# Patient Record
Sex: Male | Born: 1960 | Race: White | Hispanic: Yes | Marital: Married | Smoking: Former smoker
Health system: Southern US, Community
[De-identification: ages and names within clinical notes are randomized; demographics above are authoritative.]

## PROBLEM LIST (undated history)

## (undated) DIAGNOSIS — L03317 Cellulitis of buttock: Secondary | ICD-10-CM

## (undated) DIAGNOSIS — L0231 Cutaneous abscess of buttock: Secondary | ICD-10-CM

## (undated) DIAGNOSIS — IMO0002 Reserved for concepts with insufficient information to code with codable children: Secondary | ICD-10-CM

## (undated) DIAGNOSIS — I10 Essential (primary) hypertension: Secondary | ICD-10-CM

## (undated) DIAGNOSIS — G8220 Paraplegia, unspecified: Secondary | ICD-10-CM

## (undated) HISTORY — DX: Reserved for concepts with insufficient information to code with codable children: IMO0002

## (undated) HISTORY — PX: SPINE SURGERY: SHX786

---

## 2004-06-13 ENCOUNTER — Ambulatory Visit: Payer: Self-pay | Admitting: Family Medicine

## 2004-07-09 ENCOUNTER — Ambulatory Visit: Payer: Self-pay | Admitting: Internal Medicine

## 2004-07-11 ENCOUNTER — Ambulatory Visit: Payer: Self-pay | Admitting: *Deleted

## 2004-08-06 ENCOUNTER — Ambulatory Visit: Payer: Self-pay | Admitting: Internal Medicine

## 2004-09-12 ENCOUNTER — Ambulatory Visit: Payer: Self-pay | Admitting: Family Medicine

## 2004-12-11 ENCOUNTER — Ambulatory Visit: Payer: Self-pay | Admitting: Internal Medicine

## 2005-02-23 ENCOUNTER — Ambulatory Visit (HOSPITAL_COMMUNITY): Admission: RE | Admit: 2005-02-23 | Discharge: 2005-02-23 | Payer: Self-pay | Admitting: Family Medicine

## 2005-02-23 ENCOUNTER — Ambulatory Visit: Payer: Self-pay | Admitting: Family Medicine

## 2005-10-13 ENCOUNTER — Encounter (INDEPENDENT_AMBULATORY_CARE_PROVIDER_SITE_OTHER): Payer: Self-pay | Admitting: Family Medicine

## 2005-10-13 LAB — CONVERTED CEMR LAB: PSA: 0.92 ng/mL

## 2005-10-20 ENCOUNTER — Ambulatory Visit: Payer: Self-pay | Admitting: Family Medicine

## 2005-12-02 ENCOUNTER — Ambulatory Visit: Payer: Self-pay | Admitting: Family Medicine

## 2006-02-10 ENCOUNTER — Ambulatory Visit: Payer: Self-pay | Admitting: Family Medicine

## 2006-03-24 ENCOUNTER — Ambulatory Visit: Payer: Self-pay | Admitting: Nurse Practitioner

## 2006-05-12 ENCOUNTER — Ambulatory Visit: Payer: Self-pay | Admitting: Family Medicine

## 2006-07-21 ENCOUNTER — Ambulatory Visit: Payer: Self-pay | Admitting: Family Medicine

## 2006-09-07 ENCOUNTER — Emergency Department (HOSPITAL_COMMUNITY): Admission: EM | Admit: 2006-09-07 | Discharge: 2006-09-07 | Payer: Self-pay | Admitting: Family Medicine

## 2006-09-15 ENCOUNTER — Ambulatory Visit: Payer: Self-pay | Admitting: Family Medicine

## 2006-10-06 ENCOUNTER — Ambulatory Visit: Payer: Self-pay | Admitting: Family Medicine

## 2006-10-07 ENCOUNTER — Encounter (INDEPENDENT_AMBULATORY_CARE_PROVIDER_SITE_OTHER): Payer: Self-pay | Admitting: Family Medicine

## 2006-10-07 LAB — CONVERTED CEMR LAB: PSA: 0.92 ng/mL

## 2007-01-27 ENCOUNTER — Ambulatory Visit: Payer: Self-pay | Admitting: Family Medicine

## 2007-03-10 ENCOUNTER — Ambulatory Visit: Payer: Self-pay | Admitting: Internal Medicine

## 2007-03-23 ENCOUNTER — Ambulatory Visit: Payer: Self-pay | Admitting: Family Medicine

## 2007-04-12 ENCOUNTER — Ambulatory Visit: Payer: Self-pay | Admitting: Family Medicine

## 2007-05-25 ENCOUNTER — Ambulatory Visit: Payer: Self-pay | Admitting: Family Medicine

## 2007-05-25 DIAGNOSIS — K3189 Other diseases of stomach and duodenum: Secondary | ICD-10-CM | POA: Insufficient documentation

## 2007-05-25 DIAGNOSIS — E785 Hyperlipidemia, unspecified: Secondary | ICD-10-CM | POA: Insufficient documentation

## 2007-05-25 DIAGNOSIS — R1013 Epigastric pain: Secondary | ICD-10-CM

## 2007-05-25 DIAGNOSIS — J309 Allergic rhinitis, unspecified: Secondary | ICD-10-CM | POA: Insufficient documentation

## 2007-05-26 DIAGNOSIS — IMO0001 Reserved for inherently not codable concepts without codable children: Secondary | ICD-10-CM | POA: Insufficient documentation

## 2007-05-26 DIAGNOSIS — E119 Type 2 diabetes mellitus without complications: Secondary | ICD-10-CM | POA: Insufficient documentation

## 2007-05-26 DIAGNOSIS — E1165 Type 2 diabetes mellitus with hyperglycemia: Secondary | ICD-10-CM

## 2007-05-26 DIAGNOSIS — E669 Obesity, unspecified: Secondary | ICD-10-CM | POA: Insufficient documentation

## 2007-06-29 ENCOUNTER — Encounter (INDEPENDENT_AMBULATORY_CARE_PROVIDER_SITE_OTHER): Payer: Self-pay | Admitting: *Deleted

## 2007-09-23 ENCOUNTER — Ambulatory Visit: Payer: Self-pay | Admitting: Family Medicine

## 2007-09-23 LAB — CONVERTED CEMR LAB
AST: 24 units/L (ref 0–37)
Albumin: 4.6 g/dL (ref 3.5–5.2)
BUN: 12 mg/dL (ref 6–23)
CO2: 25 meq/L (ref 19–32)
Calcium: 9.6 mg/dL (ref 8.4–10.5)
Cholesterol: 131 mg/dL (ref 0–200)
Glucose, Bld: 150 mg/dL — ABNORMAL HIGH (ref 70–99)
HDL: 50 mg/dL (ref >39)
Potassium: 4.2 meq/L (ref 3.5–5.3)
Total CHOL/HDL Ratio: 2.6

## 2008-08-09 ENCOUNTER — Emergency Department (HOSPITAL_COMMUNITY): Admission: EM | Admit: 2008-08-09 | Discharge: 2008-08-09 | Payer: Self-pay | Admitting: Family Medicine

## 2009-03-25 ENCOUNTER — Ambulatory Visit (HOSPITAL_BASED_OUTPATIENT_CLINIC_OR_DEPARTMENT_OTHER): Admission: RE | Admit: 2009-03-25 | Discharge: 2009-03-25 | Payer: Self-pay | Admitting: Orthopedic Surgery

## 2009-03-25 ENCOUNTER — Encounter (INDEPENDENT_AMBULATORY_CARE_PROVIDER_SITE_OTHER): Payer: Self-pay | Admitting: Orthopedic Surgery

## 2010-04-18 ENCOUNTER — Emergency Department (HOSPITAL_COMMUNITY): Admission: EM | Admit: 2010-04-18 | Discharge: 2010-04-18 | Payer: Self-pay | Admitting: Family Medicine

## 2010-04-18 ENCOUNTER — Emergency Department (HOSPITAL_COMMUNITY): Admission: EM | Admit: 2010-04-18 | Discharge: 2010-04-18 | Payer: Self-pay | Admitting: Emergency Medicine

## 2010-10-02 ENCOUNTER — Emergency Department (HOSPITAL_COMMUNITY)
Admission: EM | Admit: 2010-10-02 | Discharge: 2010-10-02 | Payer: Self-pay | Source: Home / Self Care | Admitting: Family Medicine

## 2011-01-19 LAB — GLUCOSE, CAPILLARY
Glucose-Capillary: 169 mg/dL — ABNORMAL HIGH (ref 70–99)
Glucose-Capillary: 214 mg/dL — ABNORMAL HIGH (ref 70–99)
Glucose-Capillary: 239 mg/dL — ABNORMAL HIGH (ref 70–99)

## 2011-01-19 LAB — POCT I-STAT, CHEM 8
BUN: 11 mg/dL (ref 6–23)
Calcium, Ion: 1.22 mmol/L (ref 1.12–1.32)
Chloride: 101 mEq/L (ref 96–112)
Glucose, Bld: 175 mg/dL — ABNORMAL HIGH (ref 70–99)
HCT: 43 % (ref 39.0–52.0)
Sodium: 135 mEq/L (ref 135–145)
TCO2: 23 mmol/L (ref 0–100)

## 2011-02-24 NOTE — Op Note (Signed)
John Meza, John Meza                ACCOUNT NO.:  [id number]   MEDICAL RECORD NO.:  [id number]          PATIENT TYPE:  AMB   LOCATION:  DSC                          FACILITY:  [facility]   PHYSICIAN:  Feliberto Gottron. Turner Daniels, M.D.   DATE OF BIRTH:  01/22/1961   DATE OF PROCEDURE:  03/25/2009  DATE OF DISCHARGE:                               OPERATIVE REPORT   PREOPERATIVE DIAGNOSIS:  Ganglion cyst, medial aspect of right foot.   POSTOPERATIVE DIAGNOSIS:  Ganglion cyst, medial aspect of right foot.   PROCEDURE:  Excisional biopsy of right foot ganglion cyst.   SURGEON:  Feliberto Gottron. Turner Daniels, MD   FIRST ASSISTANT:  Shirl Harris, PA   ANESTHETIC:  General LMA.   ESTIMATED BLOOD LOSS:  Minimal.   FLUID REPLACEMENT:  500 mL of crystalloid.   TOURNIQUET TIME:  20 minutes.   INDICATIONS FOR PROCEDURE:  A 50 year old Hispanic gentleman with a  ganglion cyst on medial aspect of his right foot arch.  It has been  present for a couple of years.  We have aspirated twice, got out almost  30 mL of jelly-like fluid one time.  It is near the first TMT joint,  although it is actually at the junction of the middle and proximal  thirds of the first metatarsal.  In any event, it is 2-3 cm in size.  He  desires elective removal.  The risks and benefits of surgery have been  discussed and he has failed conservative treatment with the aspiration  and cortisone injection.   DESCRIPTION OF PROCEDURE:  The patient identified by armband and  received preoperative IV antibiotics at the holding area of [facility], then taken to operating room #6.  Appropriate anesthetic  monitors were attached and general LMA anesthesia induced with the  patient in supine position.  Tourniquet applied to the right calf and  the right lower extremity prepped and draped in usual sterile fashion  from the toes to the tourniquet.  Limb wrapped with an Esmarch bandage,  tourniquet inflated to 300 mmHg.  We began the  procedure by making a  medial incision over the ganglion cyst, which was directly below the  cutaneous tissue.  At about two thirds of the way down the incision, we  actually entered the ganglion cyst even though the incision was only 1  or 2 mm in depth.  We expelled the jelly from the cyst and then using  tenotomy scissors shelled out the cyst, which was tracked all the way  down to the tendon of the one of the abductors of the great toe.  We  then skived the neck of the cyst off the tendon and removed the cyst in  toto.  The wound was then irrigated out with normal saline solution.  Because of the balloon effect on the skin, we went ahead and excised 3-4  mm of skin elliptically from each side of the wound to  obtain a closure under minimal tension.  We then closed in layers using  3-0 Vicryl suture subcutaneously and 4-0 Monocryl suture in the  skin.  A  dressing of Xeroform, 4x4 dressing, sponges, Webril, and an Ace wrap was  then applied followed by a postop shoe.  The patient was awakened and  taken to the recovery room without difficulty.      Feliberto Gottron. Turner Daniels, M.D.  Electronically Signed     FJR/MEDQ  D:  03/25/2009  T:  03/26/2009  Job:  [id number]

## 2011-10-13 DIAGNOSIS — G8220 Paraplegia, unspecified: Secondary | ICD-10-CM

## 2011-10-13 HISTORY — DX: Paraplegia, unspecified: G82.20

## 2011-11-16 ENCOUNTER — Emergency Department (HOSPITAL_COMMUNITY): Payer: Worker's Compensation

## 2011-11-16 ENCOUNTER — Encounter (HOSPITAL_COMMUNITY): Payer: Self-pay | Admitting: Physical Medicine and Rehabilitation

## 2011-11-16 ENCOUNTER — Inpatient Hospital Stay (HOSPITAL_COMMUNITY)
Admission: EM | Admit: 2011-11-16 | Discharge: 2011-11-20 | DRG: 029 | Disposition: A | Discharge status: Rehabilitation Facility | Payer: Worker's Compensation | Attending: General Surgery | Admitting: General Surgery

## 2011-11-16 DIAGNOSIS — S0101XA Laceration without foreign body of scalp, initial encounter: Secondary | ICD-10-CM | POA: Diagnosis present

## 2011-11-16 DIAGNOSIS — R1013 Epigastric pain: Secondary | ICD-10-CM | POA: Diagnosis present

## 2011-11-16 DIAGNOSIS — S060X1A Concussion with loss of consciousness of 30 minutes or less, initial encounter: Secondary | ICD-10-CM | POA: Diagnosis present

## 2011-11-16 DIAGNOSIS — Z79899 Other long term (current) drug therapy: Secondary | ICD-10-CM

## 2011-11-16 DIAGNOSIS — E669 Obesity, unspecified: Secondary | ICD-10-CM | POA: Diagnosis present

## 2011-11-16 DIAGNOSIS — G8220 Paraplegia, unspecified: Secondary | ICD-10-CM | POA: Diagnosis present

## 2011-11-16 DIAGNOSIS — IMO0002 Reserved for concepts with insufficient information to code with codable children: Secondary | ICD-10-CM

## 2011-11-16 DIAGNOSIS — IMO0001 Reserved for inherently not codable concepts without codable children: Secondary | ICD-10-CM | POA: Diagnosis present

## 2011-11-16 DIAGNOSIS — W11XXXA Fall on and from ladder, initial encounter: Secondary | ICD-10-CM | POA: Diagnosis present

## 2011-11-16 DIAGNOSIS — E119 Type 2 diabetes mellitus without complications: Secondary | ICD-10-CM

## 2011-11-16 DIAGNOSIS — S0100XA Unspecified open wound of scalp, initial encounter: Secondary | ICD-10-CM

## 2011-11-16 DIAGNOSIS — Y9269 Other specified industrial and construction area as the place of occurrence of the external cause: Secondary | ICD-10-CM

## 2011-11-16 DIAGNOSIS — J309 Allergic rhinitis, unspecified: Secondary | ICD-10-CM | POA: Diagnosis present

## 2011-11-16 DIAGNOSIS — K3189 Other diseases of stomach and duodenum: Secondary | ICD-10-CM | POA: Diagnosis present

## 2011-11-16 DIAGNOSIS — Z23 Encounter for immunization: Secondary | ICD-10-CM

## 2011-11-16 DIAGNOSIS — S060X9A Concussion with loss of consciousness of unspecified duration, initial encounter: Secondary | ICD-10-CM | POA: Diagnosis present

## 2011-11-16 DIAGNOSIS — I10 Essential (primary) hypertension: Secondary | ICD-10-CM | POA: Diagnosis present

## 2011-11-16 DIAGNOSIS — E785 Hyperlipidemia, unspecified: Secondary | ICD-10-CM | POA: Diagnosis present

## 2011-11-16 DIAGNOSIS — S22009A Unspecified fracture of unspecified thoracic vertebra, initial encounter for closed fracture: Secondary | ICD-10-CM

## 2011-11-16 HISTORY — DX: Essential (primary) hypertension: I10

## 2011-11-16 HISTORY — DX: Reserved for concepts with insufficient information to code with codable children: IMO0002

## 2011-11-16 LAB — CBC
HCT: 41 % (ref 39.0–52.0)
Hemoglobin: 14.2 g/dL (ref 13.0–17.0)
MCH: 30.7 pg (ref 26.0–34.0)
MCV: 88.6 fL (ref 78.0–100.0)
RBC: 4.63 MIL/uL (ref 4.22–5.81)

## 2011-11-16 LAB — BASIC METABOLIC PANEL
BUN: 12 mg/dL (ref 6–23)
CO2: 25 mEq/L (ref 19–32)
Calcium: 9.6 mg/dL (ref 8.4–10.5)
Creatinine, Ser: 0.66 mg/dL (ref 0.50–1.35)
GFR calc non Af Amer: >90 mL/min (ref >90)
Glucose, Bld: 198 mg/dL — ABNORMAL HIGH (ref 70–99)

## 2011-11-16 LAB — GLUCOSE, CAPILLARY
Glucose-Capillary: 234 mg/dL — ABNORMAL HIGH (ref 70–99)
Glucose-Capillary: 234 mg/dL — ABNORMAL HIGH (ref 70–99)

## 2011-11-16 LAB — HEMOGLOBIN A1C
Hgb A1c MFr Bld: 10.5 % — ABNORMAL HIGH (ref <5.7)
Mean Plasma Glucose: 255 mg/dL — ABNORMAL HIGH (ref <117)

## 2011-11-16 MED ORDER — ONDANSETRON HCL 4 MG/2ML IJ SOLN: 4.0000 mg | Freq: Four times a day (QID) | INTRAMUSCULAR | Status: DC | PRN

## 2011-11-16 MED ORDER — TETANUS-DIPHTH-ACELL PERTUSSIS 5-2-15.5 LF-MCG/0.5 IM SUSP
0.5000 mL | Freq: Once | INTRAMUSCULAR | Status: DC
Start: 2011-11-16 — End: 2011-11-16

## 2011-11-16 MED ORDER — ONDANSETRON HCL 4 MG PO TABS: 4.0000 mg | ORAL_TABLET | Freq: Four times a day (QID) | ORAL | Status: DC | PRN

## 2011-11-16 MED ORDER — MORPHINE SULFATE 2 MG/ML IJ SOLN: 2.0000 mg | INTRAMUSCULAR | Status: DC | PRN

## 2011-11-16 MED ORDER — MORPHINE SULFATE 4 MG/ML IJ SOLN
6.0000 mg | Freq: Once | INTRAMUSCULAR | Status: AC
  Administered 2011-11-16: 6 mg via INTRAVENOUS
  Filled 2011-11-16: qty 2

## 2011-11-16 MED ORDER — IOHEXOL 300 MG/ML  SOLN
100.0000 mL | Freq: Once | INTRAMUSCULAR | Status: AC | PRN
  Administered 2011-11-16: 100 mL via INTRAVENOUS

## 2011-11-16 MED ORDER — TETANUS-DIPHTH-ACELL PERTUSSIS 5-2.5-18.5 LF-MCG/0.5 IM SUSP
0.5000 mL | Freq: Once | INTRAMUSCULAR | Status: AC
  Administered 2011-11-16: 0.5 mL via INTRAMUSCULAR
  Filled 2011-11-16: qty 0.5

## 2011-11-16 MED ORDER — MORPHINE SULFATE 4 MG/ML IJ SOLN
4.0000 mg | INTRAMUSCULAR | Status: DC | PRN
  Administered 2011-11-16 – 2011-11-17 (×6): 4 mg via INTRAVENOUS
  Filled 2011-11-16 (×5): qty 1

## 2011-11-16 MED ORDER — PNEUMOCOCCAL VAC POLYVALENT 25 MCG/0.5ML IJ INJ
0.5000 mL | INJECTION | INTRAMUSCULAR | Status: AC
  Filled 2011-11-16: qty 0.5

## 2011-11-16 MED ORDER — INSULIN GLARGINE 100 UNIT/ML ~~LOC~~ SOLN
5.0000 [IU] | Freq: Every day | SUBCUTANEOUS | Status: DC
  Administered 2011-11-16 – 2011-11-17 (×2): 5 [IU] via SUBCUTANEOUS
  Filled 2011-11-16: qty 3

## 2011-11-16 MED ORDER — PANTOPRAZOLE SODIUM 40 MG PO TBEC
40.0000 mg | DELAYED_RELEASE_TABLET | Freq: Every day | ORAL | Status: DC
  Administered 2011-11-17 – 2011-11-20 (×4): 40 mg via ORAL
  Filled 2011-11-16 (×4): qty 1

## 2011-11-16 MED ORDER — ONDANSETRON HCL 4 MG/2ML IJ SOLN
INTRAMUSCULAR | Status: AC
  Administered 2011-11-16: 4 mg
  Filled 2011-11-16: qty 2

## 2011-11-16 MED ORDER — INSULIN ASPART 100 UNIT/ML ~~LOC~~ SOLN
0.0000 [IU] | SUBCUTANEOUS | Status: DC
  Administered 2011-11-16 (×2): 5 [IU] via SUBCUTANEOUS
  Administered 2011-11-17: 3 [IU] via SUBCUTANEOUS
  Administered 2011-11-17: 5 [IU] via SUBCUTANEOUS
  Filled 2011-11-16: qty 3

## 2011-11-16 MED ORDER — MORPHINE SULFATE 4 MG/ML IJ SOLN
INTRAMUSCULAR | Status: AC
  Administered 2011-11-16: 4 mg via INTRAVENOUS
  Filled 2011-11-16: qty 1

## 2011-11-16 MED ORDER — PANTOPRAZOLE SODIUM 40 MG IV SOLR
40.0000 mg | Freq: Every day | INTRAVENOUS | Status: DC
  Administered 2011-11-16: 40 mg via INTRAVENOUS
  Filled 2011-11-16 (×3): qty 40

## 2011-11-16 MED ORDER — POTASSIUM CHLORIDE IN NACL 20-0.45 MEQ/L-% IV SOLN
INTRAVENOUS | Status: DC
  Administered 2011-11-16: 75 mL/h via INTRAVENOUS
  Administered 2011-11-17 – 2011-11-20 (×4): via INTRAVENOUS
  Filled 2011-11-16 (×13): qty 1000

## 2011-11-16 MED ORDER — WHITE PETROLATUM GEL
Status: AC
  Administered 2011-11-16: 18:00:00
  Filled 2011-11-16: qty 5

## 2011-11-16 MED ORDER — ONDANSETRON HCL 8 MG PO TABS
4.0000 mg | ORAL_TABLET | Freq: Four times a day (QID) | ORAL | Status: DC | PRN
  Filled 2011-11-16: qty 0.5

## 2011-11-16 MED ORDER — MORPHINE SULFATE 4 MG/ML IJ SOLN
3.0000 mg | INTRAMUSCULAR | Status: DC | PRN
  Administered 2011-11-17: 3 mg via INTRAVENOUS
  Filled 2011-11-16: qty 1

## 2011-11-16 MED ORDER — INFLUENZA VIRUS VACC SPLIT PF IM SUSP
0.5000 mL | INTRAMUSCULAR | Status: AC
  Filled 2011-11-16: qty 0.5

## 2011-11-16 NOTE — ED Provider Notes (Signed)
History     CSN: [id number]  Arrival date & time 11/16/11  1191   First MD Initiated Contact with Patient 11/16/11 [phone]      Chief Complaint  Patient presents with  . Fall    HPI Pt fell approx 8 feet from a ladder at work today. EMS reported vitals normal. Pt reports pain in his head, neck and back as well as left lower abdomen. Denies weakness of his upper extremities. Reports no sensation or ability to move his lower legs since the fall. Denies SOB. Reports mild right sided chest pain. Scalp laceration with bandage in place. Unknown tetanus status. Denies LOC. No nausea or vomiting, no changes in vision.    Past Medical History  Diagnosis Date  . Diabetes mellitus   . Hypertension     History reviewed. No pertinent past surgical history.  History reviewed. No pertinent family history.  History  Substance Use Topics  . Smoking status: Never Smoker   . Smokeless tobacco: Never Used  . Alcohol Use: 0.6 oz/week    1 Cans of beer per week      Review of Systems  All other systems reviewed and are negative.    Allergies  Review of patient's allergies indicates no known allergies.  Home Medications  No current outpatient prescriptions on file.  BP 112/59  Pulse 98  Temp(Src) 99.3 F (37.4 C) (Oral)  Resp 17  Ht 5\' 7"  (1.702 m)  Wt 180 lb 5.4 oz (81.8 kg)  BMI 28.24 kg/m2  SpO2 94%  Physical Exam  Nursing note and vitals reviewed. Constitutional: He is oriented to person, place, and time. He appears well-developed and well-nourished. No distress.  HENT:  Head: Normocephalic.       Laceration of left posterior scalp without active bleeding  Eyes: EOM are normal.  Neck: Neck supple.       immobilized in C collar. Cervical and paracervical tenderness  Cardiovascular: Normal rate, regular rhythm, normal heart sounds and intact distal pulses.   Pulmonary/Chest: Effort normal and breath sounds normal. No respiratory distress.       Mild tenderness of right  anterior chest wall  Abdominal: Soft. He exhibits no distension.       Mild tenderness of left lower abdomen without peritonitis  Musculoskeletal: He exhibits no edema.       Tenderness of right anterior pelvis without deformity. Tenderness of thoracic spine without obvious stepoff  Neurological: He is alert and oriented to person, place, and time.       No patellar or achilles reflexes bilaterally. No sensation below the umbilicus. No motor function of his lower extremities  Skin: Skin is warm and dry.  Psychiatric: He has a normal mood and affect. Judgment normal.    ED Course  Procedures (including critical care time)  Labs Reviewed  CBC - Abnormal; Notable for the following:    Platelets 148 (*)    All other components within normal limits  BASIC METABOLIC PANEL - Abnormal; Notable for the following:    Glucose, Bld 198 (*)    All other components within normal limits  GLUCOSE, CAPILLARY - Abnormal; Notable for the following:    Glucose-Capillary 179 (*)    All other components within normal limits  HEMOGLOBIN A1C - Abnormal; Notable for the following:    Hemoglobin A1C 10.5 (*)    Mean Plasma Glucose 255 (*)    All other components within normal limits  GLUCOSE, CAPILLARY - Abnormal; Notable for the following:  Glucose-Capillary 234 (*)    All other components within normal limits  GLUCOSE, CAPILLARY - Abnormal; Notable for the following:    Glucose-Capillary 234 (*)    All other components within normal limits  MRSA PCR SCREENING  CBC  BASIC METABOLIC PANEL   Dg Chest 1 View  11/16/2011  *RADIOLOGY REPORT*  Clinical Data: , fall  CHEST - 1 VIEW  Comparison: 02/23/2005  Findings: Accentuated heart size and mediastinum, particular left mediastinum. The aortic arch is poorly visualized. Low lung volumes. Crowding of perihilar markings. No definite infiltrate, pleural effusion or pneumothorax. No definite fractures identified.  IMPRESSION: Prominent superior mediastinum,  particularly on the left, question related to technique. Follow-up upright PA and lateral chest radiographs recommended to exclude mediastinal widening.  Original Report Authenticated By: Lollie Marrow, M.D.   Dg Chest 2 View  11/16/2011  *RADIOLOGY REPORT*  Clinical Data: Left-sided chest pain and weakness.  Question of mediastinal widening on portable exam. The patient fell.  CHEST - 2 VIEW  Comparison: 11/16/2011, 02/23/2005  Findings: The mediastinum continues appears somewhat wide.  Legrand Rams this being related to the AP position of the patient.  However, in the setting of trauma, I cannot clear the mediastinum.  Heart size is mildly enlarged.  There is pulmonary vascular congestion but no overt edema.  There are no focal consolidations.  On the lateral view, there is a fracture of T11, associated with kyphotic deformity and anterolisthesis of T10 on T11.  This is a new finding since the previous lateral exam.  Further evaluation with CT is recommended.  IMPRESSION:  1. Mediastinal width continues to be accentuated, likely related to technique.  However, given the fracture of T11, further evaluation with CT of the chest with contrast is recommended.  Reconstructed images can then be performed of the T11 level. 2.  Fracture of T11 may well be acute.  This is associated kyphosis and anterolisthesis.  See above.  Critical test results telephoned to Dr. Patria Mane at the time of interpretation on date 11/16/2011 at time 1:28 p.m.  Original Report Authenticated By: Patterson Hammersmith, M.D.   Dg Pelvis 1-2 Views  11/16/2011  *RADIOLOGY REPORT*  Clinical Data: Trauma, fall  PELVIS - 1-2 VIEW  Comparison: None  Findings: Osseous mineralization normal. Symmetric hip and SI joints. No acute fracture, dislocation, or bone destruction.  IMPRESSION: No acute bony abnormalities.  Original Report Authenticated By: Lollie Marrow, M.D.   Ct Head Wo Contrast  11/16/2011  *RADIOLOGY REPORT*  Clinical Data:  Larey Seat.  Hit head.  CT  HEAD WITHOUT CONTRAST CT CERVICAL SPINE WITHOUT CONTRAST  Technique:  Multidetector CT imaging of the head and cervical spine was performed following the standard protocol without intravenous contrast.  Multiplanar CT image reconstructions of the cervical spine were also generated.  Comparison:  None  CT HEAD  Findings: The ventricles are normal.  No extra-axial fluid collections are seen.  The brainstem and cerebellum are unremarkable.  No acute intracranial findings such as infarction or hemorrhage.  No mass lesions.  The bony calvarium is intact.  No acute skull fracture.  Maxillary, ethmoid and sphenoid sinus disease is noted.  The posterior scalp laceration and small hematoma is noted.  No underlying skull fracture or radiopaque foreign body.  IMPRESSION: No acute intracranial findings or acute skull fracture.  CT CERVICAL SPINE  Findings: The sagittal reformatted images demonstrate normal alignment of the cervical vertebral bodies.  Disc spaces and vertebral bodies are maintained.  No acute  bony findings or abnormal prevertebral soft tissue swelling.  The facets are normally aligned.  No facet or laminar fractures are seen. No large disc protrusions.  The neural foramen are patent.  The skull base C1 and C1-C2 articulations are maintained.  The dens is normal.  There are scattered cervical lymph nodes.  The lung apices are clear.  IMPRESSION: Normal alignment and no acute bony findings.  Original Report Authenticated By: P. Loralie Champagne, M.D.   Ct Angio Chest W/cm &/or Wo Cm  11/16/2011  *RADIOLOGY REPORT*  Clinical Data: Fall.  Evaluate for aortic injury.  CT ANGIOGRAPHY CHEST,CT ABDOMEN AND PELVIS WITH CONTRAST  Technique:  Multidetector CT imaging of the chest using the standard protocol during bolus administration of intravenous contrast. Multiplanar reconstructed images including MIPs were obtained and reviewed to evaluate the vascular anatomy.,Technique: Mul  Contrast: OMNIPAQUE IOHEXOL 300  MG/ML IV SOLN  Comparison: Two-view chest study 02/23/2005  Findings: No evidence of aortic dissection, transection, or aneurysm.  No evidence of intramural hematoma.  Specifically, there is no intimal abnormality, mediastinal hemorrhage, or abrupt aortic caliber change.  No evidence of mediastinal adenopathy.  Mildly prominent mediastinal fat in the anterior mediastinum.  No pericardial effusion  Patchy dependent atelectasis bilaterally.  No pneumothorax.  No pleural effusion   There is a markedly comminuted fracture of T11.  This is associated with fractures of the posterior elements.  There is 15 mm anterolisthesis of T8 upon T9.  The fragmentation, retropulsion, and anterior translation above the fracture site results in severe narrowing of the central canal, which measures approximately 4 mm on image 27 of series 8.  There are no associated fractures of the spinous process sees at T7 and T8. There is a horizontal orientation elements and this fracture.  This may actually represent a Chance fracture.  Overall, there has been fracture at T9 with 1.5 cm anterior translation of the spinal column above the fracture site and this is resulted in near obliteration of the central canal space.  Neural injury is suspected.  IMPRESSION: Severe T11 vertebral injury as described.  There is involvement of the anterior and posterior elements nearly obliterating the central canal space.  This is a result of fragmentation, retropulsion, and anterior translation of the spinal canal above the fracture site. A horizontal element of the fracture raises possibility that this is a Chance fracture.  No evidence of thoracic aortic injury.  CT ABDOMEN AND PELVIS WITH CONTRAST  Technique:  Multidetector CT imaging of the abdomen and pelvis was performed using the standard protocol following bolus administration of intravenous contrast.  Findings:  No evidence of aortic injury.  Liver, gallbladder, spleen, pancreas, adrenal glands, kidneys  are within normal limits. Bladder and prostate are within normal limits.  Left inguinal hernia containing adipose tissue.  No free fluid.  No abnormal adenopathy.  No evidence of lumbar vertebral body injury or pelvic bony injury.  There are left transverse process fractures of L1 and L2.  These are minimally displaced.  Small hiatal hernia is suspected.  IMPRESSION: No evidence of intra-abdominal organ injury.  Left L1 and L2 transverse process fractures.  Original Report Authenticated By: Donavan Burnet, M.D.   Ct Cervical Spine Wo Contrast  11/16/2011  *RADIOLOGY REPORT*  Clinical Data:  Larey Seat.  Hit head.  CT HEAD WITHOUT CONTRAST CT CERVICAL SPINE WITHOUT CONTRAST  Technique:  Multidetector CT imaging of the head and cervical spine was performed following the standard protocol without intravenous contrast.  Multiplanar  CT image reconstructions of the cervical spine were also generated.  Comparison:  None  CT HEAD  Findings: The ventricles are normal.  No extra-axial fluid collections are seen.  The brainstem and cerebellum are unremarkable.  No acute intracranial findings such as infarction or hemorrhage.  No mass lesions.  The bony calvarium is intact.  No acute skull fracture.  Maxillary, ethmoid and sphenoid sinus disease is noted.  The posterior scalp laceration and small hematoma is noted.  No underlying skull fracture or radiopaque foreign body.  IMPRESSION: No acute intracranial findings or acute skull fracture.  CT CERVICAL SPINE  Findings: The sagittal reformatted images demonstrate normal alignment of the cervical vertebral bodies.  Disc spaces and vertebral bodies are maintained.  No acute bony findings or abnormal prevertebral soft tissue swelling.  The facets are normally aligned.  No facet or laminar fractures are seen. No large disc protrusions.  The neural foramen are patent.  The skull base C1 and C1-C2 articulations are maintained.  The dens is normal.  There are scattered cervical lymph  nodes.  The lung apices are clear.  IMPRESSION: Normal alignment and no acute bony findings.  Original Report Authenticated By: P. Loralie Champagne, M.D.   Ct Abdomen Pelvis W Contrast  11/16/2011  *RADIOLOGY REPORT*  Clinical Data: Fall.  Evaluate for aortic injury.  CT ANGIOGRAPHY CHEST,CT ABDOMEN AND PELVIS WITH CONTRAST  Technique:  Multidetector CT imaging of the chest using the standard protocol during bolus administration of intravenous contrast. Multiplanar reconstructed images including MIPs were obtained and reviewed to evaluate the vascular anatomy.,Technique: Mul  Contrast: OMNIPAQUE IOHEXOL 300 MG/ML IV SOLN  Comparison: Two-view chest study 02/23/2005  Findings: No evidence of aortic dissection, transection, or aneurysm.  No evidence of intramural hematoma.  Specifically, there is no intimal abnormality, mediastinal hemorrhage, or abrupt aortic caliber change.  No evidence of mediastinal adenopathy.  Mildly prominent mediastinal fat in the anterior mediastinum.  No pericardial effusion  Patchy dependent atelectasis bilaterally.  No pneumothorax.  No pleural effusion   There is a markedly comminuted fracture of T11.  This is associated with fractures of the posterior elements.  There is 15 mm anterolisthesis of T8 upon T9.  The fragmentation, retropulsion, and anterior translation above the fracture site results in severe narrowing of the central canal, which measures approximately 4 mm on image 27 of series 8.  There are no associated fractures of the spinous process sees at T7 and T8. There is a horizontal orientation elements and this fracture.  This may actually represent a Chance fracture.  Overall, there has been fracture at T9 with 1.5 cm anterior translation of the spinal column above the fracture site and this is resulted in near obliteration of the central canal space.  Neural injury is suspected.  IMPRESSION: Severe T11 vertebral injury as described.  There is involvement of the  anterior and posterior elements nearly obliterating the central canal space.  This is a result of fragmentation, retropulsion, and anterior translation of the spinal canal above the fracture site. A horizontal element of the fracture raises possibility that this is a Chance fracture.  No evidence of thoracic aortic injury.  CT ABDOMEN AND PELVIS WITH CONTRAST  Technique:  Multidetector CT imaging of the abdomen and pelvis was performed using the standard protocol following bolus administration of intravenous contrast.  Findings:  No evidence of aortic injury.  Liver, gallbladder, spleen, pancreas, adrenal glands, kidneys are within normal limits. Bladder and prostate are within normal limits.  Left inguinal hernia containing adipose tissue.  No free fluid.  No abnormal adenopathy.  No evidence of lumbar vertebral body injury or pelvic bony injury.  There are left transverse process fractures of L1 and L2.  These are minimally displaced.  Small hiatal hernia is suspected.  IMPRESSION: No evidence of intra-abdominal organ injury.  Left L1 and L2 transverse process fractures.  Original Report Authenticated By: Donavan Burnet, M.D.   I personally reviewed the patients images  1. Fracture of thoracic vertebra, T7-T12 with complete cord lesion   2. Occipital scalp laceration   3. Accidental fall from ladder       MDM  Pt with thoracic cord injury and scalp laceration. Dr Lovell Sheehan with [facility] and Trauma team consulted. Likely complete cord injury at the time of injury. Scalp laceration to be repaired by trauma team. Delay in my physical exam diagnosis of his flaccid lower extremities secondary to spanish only speaking and my inability to utilize the translator phones in a time fashion secondary additional emergent needs in the department. He was unable to move his lower extremities on arrival.  My consultants were informed of this delay. Family updated. Additional hx and completed LE neuro exam finished with the  assistance of his daughter and her translation provided.         Lyanne Co, MD 11/16/11 2300

## 2011-11-16 NOTE — ED Notes (Signed)
Patient transported to CT 

## 2011-11-16 NOTE — ED Notes (Signed)
Trauma at bedside, repair laceration to the back of head

## 2011-11-16 NOTE — H&P (Signed)
John Meza is an 51 y.o. male.   Chief Complaint: Fall, unable to move legs HPI: John Meza is a 51 yo male who was working on a ladder about 10 feet up when he lost balance and fell. He does not know exactly how he landed and reports +LOC for brief period.  He was unable to feel or move his legs following the fall and also reported pain in his posterior head.  He was found to have a T 11 fracture/dislocation. He remains paraplegic. We are asked to see the pt for admission. Dr. Lovell Sheehan of neurosurgery is here seeing the pt as well.   PMHX: DM, obesity, hyperlipidemia, dyspepsia, allergic rhinitis History reviewed. No pertinent past surgical history.- No previous surgery  History reviewed. No pertinent family history. Social History:  reports that he has never smoked. He does not have any smokeless tobacco history on file. He reports that he does not drink alcohol or use illicit drugs. He is married with children.   Allergies: No Known Allergies  Medications Prior to Admission  Medication Dose Route Frequency Provider Last Rate Last Dose  . iohexol (OMNIPAQUE) 300 MG/ML solution 100 mL  100 mL Intravenous Once PRN Medication Radiologist, MD   100 mL at 11/16/11 1419  . morphine 4 MG/ML injection 6 mg  6 mg Intravenous Once Lyanne Co, MD   6 mg at 11/16/11 0959  . morphine 4 MG/ML injection 6 mg  6 mg Intravenous Once Lyanne Co, MD   6 mg at 11/16/11 1135  . ondansetron (ZOFRAN) 4 MG/2ML injection        4 mg at 11/16/11 1019  . TDaP (BOOSTRIX) injection 0.5 mL  0.5 mL Intramuscular Once Lyanne Co, MD   0.5 mL at 11/16/11 1051  . DISCONTD: TDaP (ADACEL) injection 0.5 mL  0.5 mL Intramuscular Once Lyanne Co, MD       No current outpatient prescriptions on file as of 11/16/2011.    Results for orders placed during the hospital encounter of 11/16/11 (from the past 48 hour(s))  CBC     Status: Abnormal   Collection Time   11/16/11  9:52 AM      Component Value Range  Comment   WBC 6.5  4.0 - 10.5 (K/uL)    RBC 4.63  4.22 - 5.81 (MIL/uL)    Hemoglobin 14.2  13.0 - 17.0 (g/dL)    HCT 16.1  09.6 - 04.5 (%)    MCV 88.6  78.0 - 100.0 (fL)    MCH 30.7  26.0 - 34.0 (pg)    MCHC 34.6  30.0 - 36.0 (g/dL)    RDW 40.9  81.1 - 91.4 (%)    Platelets 148 (*) 150 - 400 (K/uL)   BASIC METABOLIC PANEL     Status: Abnormal   Collection Time   11/16/11  9:52 AM      Component Value Range Comment   Sodium 136  135 - 145 (mEq/L)    Potassium 3.5  3.5 - 5.1 (mEq/L)    Chloride 101  96 - 112 (mEq/L)    CO2 25  19 - 32 (mEq/L)    Glucose, Bld 198 (*) 70 - 99 (mg/dL)    BUN 12  6 - 23 (mg/dL)    Creatinine, Ser 7.82  0.50 - 1.35 (mg/dL)    Calcium 9.6  8.4 - 10.5 (mg/dL)    GFR calc non Af Amer >90  >90 (mL/min)    GFR  calc Af Amer >90  >90 (mL/min)   GLUCOSE, CAPILLARY     Status: Abnormal   Collection Time   11/16/11 10:03 AM      Component Value Range Comment   Glucose-Capillary 179 (*) 70 - 99 (mg/dL)    Dg Chest 1 View  04/19/2955  *RADIOLOGY REPORT*  Clinical Data: , fall  CHEST - 1 VIEW  Comparison: 02/23/2005  Findings: Accentuated heart size and mediastinum, particular left mediastinum. The aortic arch is poorly visualized. Low lung volumes. Crowding of perihilar markings. No definite infiltrate, pleural effusion or pneumothorax. No definite fractures identified.  IMPRESSION: Prominent superior mediastinum, particularly on the left, question related to technique. Follow-up upright PA and lateral chest radiographs recommended to exclude mediastinal widening.  Original Report Authenticated By: Lollie Marrow, M.D.   Dg Chest 2 View  11/16/2011  *RADIOLOGY REPORT*  Clinical Data: Left-sided chest pain and weakness.  Question of mediastinal widening on portable exam. The patient fell.  CHEST - 2 VIEW  Comparison: 11/16/2011, 02/23/2005  Findings: The mediastinum continues appears somewhat wide.  Legrand Rams this being related to the AP position of the patient.  However, in the  setting of trauma, I cannot clear the mediastinum.  Heart size is mildly enlarged.  There is pulmonary vascular congestion but no overt edema.  There are no focal consolidations.  On the lateral view, there is a fracture of T11, associated with kyphotic deformity and anterolisthesis of T10 on T11.  This is a new finding since the previous lateral exam.  Further evaluation with CT is recommended.  IMPRESSION:  1. Mediastinal width continues to be accentuated, likely related to technique.  However, given the fracture of T11, further evaluation with CT of the chest with contrast is recommended.  Reconstructed images can then be performed of the T11 level. 2.  Fracture of T11 may well be acute.  This is associated kyphosis and anterolisthesis.  See above.  Critical test results telephoned to Dr. Patria Mane at the time of interpretation on date 11/16/2011 at time 1:28 p.m.  Original Report Authenticated By: Patterson Hammersmith, M.D.   Dg Pelvis 1-2 Views  11/16/2011  *RADIOLOGY REPORT*  Clinical Data: Trauma, fall  PELVIS - 1-2 VIEW  Comparison: None  Findings: Osseous mineralization normal. Symmetric hip and SI joints. No acute fracture, dislocation, or bone destruction.  IMPRESSION: No acute bony abnormalities.  Original Report Authenticated By: Lollie Marrow, M.D.   Ct Head Wo Contrast  11/16/2011  *RADIOLOGY REPORT*  Clinical Data:  Larey Seat.  Hit head.  CT HEAD WITHOUT CONTRAST CT CERVICAL SPINE WITHOUT CONTRAST  Technique:  Multidetector CT imaging of the head and cervical spine was performed following the standard protocol without intravenous contrast.  Multiplanar CT image reconstructions of the cervical spine were also generated.  Comparison:  None  CT HEAD  Findings: The ventricles are normal.  No extra-axial fluid collections are seen.  The brainstem and cerebellum are unremarkable.  No acute intracranial findings such as infarction or hemorrhage.  No mass lesions.  The bony calvarium is intact.  No acute skull  fracture.  Maxillary, ethmoid and sphenoid sinus disease is noted.  The posterior scalp laceration and small hematoma is noted.  No underlying skull fracture or radiopaque foreign body.  IMPRESSION: No acute intracranial findings or acute skull fracture.  CT CERVICAL SPINE  Findings: The sagittal reformatted images demonstrate normal alignment of the cervical vertebral bodies.  Disc spaces and vertebral bodies are maintained.  No acute bony  findings or abnormal prevertebral soft tissue swelling.  The facets are normally aligned.  No facet or laminar fractures are seen. No large disc protrusions.  The neural foramen are patent.  The skull base C1 and C1-C2 articulations are maintained.  The dens is normal.  There are scattered cervical lymph nodes.  The lung apices are clear.  IMPRESSION: Normal alignment and no acute bony findings.  Original Report Authenticated By: P. Loralie Champagne, M.D.   Ct Cervical Spine Wo Contrast  11/16/2011  *RADIOLOGY REPORT*  Clinical Data:  Larey Seat.  Hit head.  CT HEAD WITHOUT CONTRAST CT CERVICAL SPINE WITHOUT CONTRAST  Technique:  Multidetector CT imaging of the head and cervical spine was performed following the standard protocol without intravenous contrast.  Multiplanar CT image reconstructions of the cervical spine were also generated.  Comparison:  None  CT HEAD  Findings: The ventricles are normal.  No extra-axial fluid collections are seen.  The brainstem and cerebellum are unremarkable.  No acute intracranial findings such as infarction or hemorrhage.  No mass lesions.  The bony calvarium is intact.  No acute skull fracture.  Maxillary, ethmoid and sphenoid sinus disease is noted.  The posterior scalp laceration and small hematoma is noted.  No underlying skull fracture or radiopaque foreign body.  IMPRESSION: No acute intracranial findings or acute skull fracture.  CT CERVICAL SPINE  Findings: The sagittal reformatted images demonstrate normal alignment of the cervical  vertebral bodies.  Disc spaces and vertebral bodies are maintained.  No acute bony findings or abnormal prevertebral soft tissue swelling.  The facets are normally aligned.  No facet or laminar fractures are seen. No large disc protrusions.  The neural foramen are patent.  The skull base C1 and C1-C2 articulations are maintained.  The dens is normal.  There are scattered cervical lymph nodes.  The lung apices are clear.  IMPRESSION: Normal alignment and no acute bony findings.  Original Report Authenticated By: P. Loralie Champagne, M.D.    Review of Systems  Constitutional: Negative.   HENT: Negative.   Eyes: Negative.   Respiratory: Negative.   Cardiovascular: Negative.   Gastrointestinal: Negative.   Genitourinary: Negative.   Musculoskeletal:       Pertinent for pain in lower back and then numbness and no movement of lower extremities.  Skin: Negative.   Neurological:       As above, paraplegia with no sensation from just below umbilicus distally  Endo/Heme/Allergies:       DM for past 10 years- takes pill for BS control  Psychiatric/Behavioral: Negative.     Blood pressure 118/69, pulse 90, temperature 97.4 F (36.3 C), temperature source Oral, resp. rate 20, SpO2 97.00%. Physical Exam  Constitutional: He is oriented to person, place, and time. He appears well-developed and well-nourished.  HENT:  Right Ear: External ear normal.  Left Ear: External ear normal.  Nose: Nose normal.  Mouth/Throat: Oropharynx is clear and moist.       Stellate small <3 cm laceration to the posterior scalp  Eyes: Conjunctivae and EOM are normal. Pupils are equal, round, and reactive to light.  Neck: Normal range of motion. Neck supple. No tracheal deviation present.  Cardiovascular: Normal rate, regular rhythm, normal heart sounds and intact distal pulses.  Exam reveals no gallop and no friction rub.   No murmur heard. Respiratory: Effort normal and breath sounds normal. No respiratory distress. He  has no wheezes. He has no rales. He exhibits no tenderness.  GI: Soft. Bowel sounds  are normal. He exhibits no distension. There is no tenderness.  Genitourinary: Penis normal.  Musculoskeletal:       Paraplegic Bilateral upper extremities- strength WFL   Neurological: He is alert and oriented to person, place, and time. He displays abnormal reflex. He exhibits abnormal muscle tone.       Hyporeflexic about patella  Skin: Skin is warm and dry.  Psychiatric: He has a normal mood and affect. His behavior is normal. Judgment normal.     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 Concussion with brief LOC 3. Scalp laceration 4. DM  Plan: Admit Trauma Service to NICU, evaluation per Dr. Lovell Sheehan underway.   Will plan to repair scalp laceration in ED.  Per  Dr. Lovell Sheehan will need surgery later this week, so will keep on bedrest, logroll only.     Burney Calzadilla,PA-C Pager [phone] General Trauma Pager [phone]

## 2011-11-16 NOTE — ED Notes (Signed)
Pt states that he is unable to feel or move his leg bilat, pt unable to move bilat lower extremities

## 2011-11-16 NOTE — Consult Note (Signed)
Reason for Consult: T11 fracture and paraplegia. Referring Physician: The ER physician  Jovanni Rash is an 51 y.o. male.  HPI: The patient is a 51 year old Hispanic male immigrant from [place]. The patient was in his usual state of good health today until fell from approximately 12 foot ladder. He immediately had back pain and could not feel or move his lower extremities. The patient was worked up in the emergency garment with a CAT scan of his chest abdomen pelvis. This demonstrated a T11 fracture. I was called by the emergency room physician. I immediately came to the emergency department and evaluated the patient. The patient does not speak English well with his daughter speaks excellent English and translates for [place]. The patient states he cannot feel or move his lower extremities at all.  History reviewed. No pertinent past medical history. patient is diabetic  History reviewed. No pertinent past surgical history.  History reviewed. No pertinent family history.  Social History:  reports that he has never smoked. He does not have any smokeless tobacco history on file. He reports that he does not drink alcohol or use illicit drugs.  Allergies: No Known Allergies  Medications:  Prior to Admission:  (Not in a hospital admission) Scheduled:   .  morphine injection  6 mg Intravenous Once  .  morphine injection  6 mg Intravenous Once  . ondansetron      . TDaP  0.5 mL Intramuscular Once  . DISCONTD: TDaP  0.5 mL Intramuscular Once   Continuous:  [id number] Anti-infectives    None      Results for orders placed during the hospital encounter of 11/16/11 (from the past 48 hour(s))  CBC     Status: Abnormal   Collection Time   11/16/11  9:52 AM      Component Value Range Comment   WBC 6.5  4.0 - 10.5 (K/uL)    RBC 4.63  4.22 - 5.81 (MIL/uL)    Hemoglobin 14.2  13.0 - 17.0 (g/dL)    HCT 09.8  11.9 - 14.7 (%)    MCV 88.6  78.0 - 100.0 (fL)    MCH 30.7  26.0 - 34.0 (pg)    MCHC  34.6  30.0 - 36.0 (g/dL)    RDW 82.9  56.2 - 13.0 (%)    Platelets 148 (*) 150 - 400 (K/uL)   BASIC METABOLIC PANEL     Status: Abnormal   Collection Time   11/16/11  9:52 AM      Component Value Range Comment   Sodium 136  135 - 145 (mEq/L)    Potassium 3.5  3.5 - 5.1 (mEq/L)    Chloride 101  96 - 112 (mEq/L)    CO2 25  19 - 32 (mEq/L)    Glucose, Bld 198 (*) 70 - 99 (mg/dL)    BUN 12  6 - 23 (mg/dL)    Creatinine, Ser 8.65  0.50 - 1.35 (mg/dL)    Calcium 9.6  8.4 - 10.5 (mg/dL)    GFR calc non Af Amer >90  >90 (mL/min)    GFR calc Af Amer >90  >90 (mL/min)   GLUCOSE, CAPILLARY     Status: Abnormal   Collection Time   11/16/11 10:03 AM      Component Value Range Comment   Glucose-Capillary 179 (*) 70 - 99 (mg/dL)     Dg Chest 1 View  04/19/4695  *RADIOLOGY REPORT*  Clinical Data: , fall  CHEST - 1 VIEW  Comparison:  02/23/2005  Findings: Accentuated heart size and mediastinum, particular left mediastinum. The aortic arch is poorly visualized. Low lung volumes. Crowding of perihilar markings. No definite infiltrate, pleural effusion or pneumothorax. No definite fractures identified.  IMPRESSION: Prominent superior mediastinum, particularly on the left, question related to technique. Follow-up upright PA and lateral chest radiographs recommended to exclude mediastinal widening.  Original Report Authenticated By: Lollie Marrow, M.D.   Dg Chest 2 View  11/16/2011  *RADIOLOGY REPORT*  Clinical Data: Left-sided chest pain and weakness.  Question of mediastinal widening on portable exam. The patient fell.  CHEST - 2 VIEW  Comparison: 11/16/2011, 02/23/2005  Findings: The mediastinum continues appears somewhat wide.  Legrand Rams this being related to the AP position of the patient.  However, in the setting of trauma, I cannot clear the mediastinum.  Heart size is mildly enlarged.  There is pulmonary vascular congestion but no overt edema.  There are no focal consolidations.  On the lateral view, there is a  fracture of T11, associated with kyphotic deformity and anterolisthesis of T10 on T11.  This is a new finding since the previous lateral exam.  Further evaluation with CT is recommended.  IMPRESSION:  1. Mediastinal width continues to be accentuated, likely related to technique.  However, given the fracture of T11, further evaluation with CT of the chest with contrast is recommended.  Reconstructed images can then be performed of the T11 level. 2.  Fracture of T11 may well be acute.  This is associated kyphosis and anterolisthesis.  See above.  Critical test results telephoned to Dr. Patria Mane at the time of interpretation on date 11/16/2011 at time 1:28 p.m.  Original Report Authenticated By: Patterson Hammersmith, M.D.   Dg Pelvis 1-2 Views  11/16/2011  *RADIOLOGY REPORT*  Clinical Data: Trauma, fall  PELVIS - 1-2 VIEW  Comparison: None  Findings: Osseous mineralization normal. Symmetric hip and SI joints. No acute fracture, dislocation, or bone destruction.  IMPRESSION: No acute bony abnormalities.  Original Report Authenticated By: Lollie Marrow, M.D.   Ct Head Wo Contrast  11/16/2011  *RADIOLOGY REPORT*  Clinical Data:  Larey Seat.  Hit head.  CT HEAD WITHOUT CONTRAST CT CERVICAL SPINE WITHOUT CONTRAST  Technique:  Multidetector CT imaging of the head and cervical spine was performed following the standard protocol without intravenous contrast.  Multiplanar CT image reconstructions of the cervical spine were also generated.  Comparison:  None  CT HEAD  Findings: The ventricles are normal.  No extra-axial fluid collections are seen.  The brainstem and cerebellum are unremarkable.  No acute intracranial findings such as infarction or hemorrhage.  No mass lesions.  The bony calvarium is intact.  No acute skull fracture.  Maxillary, ethmoid and sphenoid sinus disease is noted.  The posterior scalp laceration and small hematoma is noted.  No underlying skull fracture or radiopaque foreign body.  IMPRESSION: No acute  intracranial findings or acute skull fracture.  CT CERVICAL SPINE  Findings: The sagittal reformatted images demonstrate normal alignment of the cervical vertebral bodies.  Disc spaces and vertebral bodies are maintained.  No acute bony findings or abnormal prevertebral soft tissue swelling.  The facets are normally aligned.  No facet or laminar fractures are seen. No large disc protrusions.  The neural foramen are patent.  The skull base C1 and C1-C2 articulations are maintained.  The dens is normal.  There are scattered cervical lymph nodes.  The lung apices are clear.  IMPRESSION: Normal alignment and no acute bony findings.  Original Report Authenticated By: P. Loralie Champagne, M.D.   Ct Angio Chest W/cm &/or Wo Cm  11/16/2011  *RADIOLOGY REPORT*  Clinical Data: Fall.  Evaluate for aortic injury.  CT ANGIOGRAPHY CHEST,CT ABDOMEN AND PELVIS WITH CONTRAST  Technique:  Multidetector CT imaging of the chest using the standard protocol during bolus administration of intravenous contrast. Multiplanar reconstructed images including MIPs were obtained and reviewed to evaluate the vascular anatomy.,Technique: Mul  Contrast: OMNIPAQUE IOHEXOL 300 MG/ML IV SOLN  Comparison: Two-view chest study 02/23/2005  Findings: No evidence of aortic dissection, transection, or aneurysm.  No evidence of intramural hematoma.  Specifically, there is no intimal abnormality, mediastinal hemorrhage, or abrupt aortic caliber change.  No evidence of mediastinal adenopathy.  Mildly prominent mediastinal fat in the anterior mediastinum.  No pericardial effusion  Patchy dependent atelectasis bilaterally.  No pneumothorax.  No pleural effusion   There is a markedly comminuted fracture of T11.  This is associated with fractures of the posterior elements.  There is 15 mm anterolisthesis of T8 upon T9.  The fragmentation, retropulsion, and anterior translation above the fracture site results in severe narrowing of the central canal, which  measures approximately 4 mm on image 27 of series 8.  There are no associated fractures of the spinous process sees at T7 and T8. There is a horizontal orientation elements and this fracture.  This may actually represent a Chance fracture.  Overall, there has been fracture at T9 with 1.5 cm anterior translation of the spinal column above the fracture site and this is resulted in near obliteration of the central canal space.  Neural injury is suspected.  IMPRESSION: Severe T11 vertebral injury as described.  There is involvement of the anterior and posterior elements nearly obliterating the central canal space.  This is a result of fragmentation, retropulsion, and anterior translation of the spinal canal above the fracture site. A horizontal element of the fracture raises possibility that this is a Chance fracture.  No evidence of thoracic aortic injury.  CT ABDOMEN AND PELVIS WITH CONTRAST  Technique:  Multidetector CT imaging of the abdomen and pelvis was performed using the standard protocol following bolus administration of intravenous contrast.  Findings:  No evidence of aortic injury.  Liver, gallbladder, spleen, pancreas, adrenal glands, kidneys are within normal limits. Bladder and prostate are within normal limits.  Left inguinal hernia containing adipose tissue.  No free fluid.  No abnormal adenopathy.  No evidence of lumbar vertebral body injury or pelvic bony injury.  There are left transverse process fractures of L1 and L2.  These are minimally displaced.  Small hiatal hernia is suspected.  IMPRESSION: No evidence of intra-abdominal organ injury.  Left L1 and L2 transverse process fractures.  Original Report Authenticated By: Donavan Burnet, M.D.   Ct Cervical Spine Wo Contrast  11/16/2011  *RADIOLOGY REPORT*  Clinical Data:  Larey Seat.  Hit head.  CT HEAD WITHOUT CONTRAST CT CERVICAL SPINE WITHOUT CONTRAST  Technique:  Multidetector CT imaging of the head and cervical spine was performed following the  standard protocol without intravenous contrast.  Multiplanar CT image reconstructions of the cervical spine were also generated.  Comparison:  None  CT HEAD  Findings: The ventricles are normal.  No extra-axial fluid collections are seen.  The brainstem and cerebellum are unremarkable.  No acute intracranial findings such as infarction or hemorrhage.  No mass lesions.  The bony calvarium is intact.  No acute skull fracture.  Maxillary, ethmoid and sphenoid sinus disease is  noted.  The posterior scalp laceration and small hematoma is noted.  No underlying skull fracture or radiopaque foreign body.  IMPRESSION: No acute intracranial findings or acute skull fracture.  CT CERVICAL SPINE  Findings: The sagittal reformatted images demonstrate normal alignment of the cervical vertebral bodies.  Disc spaces and vertebral bodies are maintained.  No acute bony findings or abnormal prevertebral soft tissue swelling.  The facets are normally aligned.  No facet or laminar fractures are seen. No large disc protrusions.  The neural foramen are patent.  The skull base C1 and C1-C2 articulations are maintained.  The dens is normal.  There are scattered cervical lymph nodes.  The lung apices are clear.  IMPRESSION: Normal alignment and no acute bony findings.  Original Report Authenticated By: P. Loralie Champagne, M.D.   Ct Abdomen Pelvis W Contrast  11/16/2011  *RADIOLOGY REPORT*  Clinical Data: Fall.  Evaluate for aortic injury.  CT ANGIOGRAPHY CHEST,CT ABDOMEN AND PELVIS WITH CONTRAST  Technique:  Multidetector CT imaging of the chest using the standard protocol during bolus administration of intravenous contrast. Multiplanar reconstructed images including MIPs were obtained and reviewed to evaluate the vascular anatomy.,Technique: Mul  Contrast: OMNIPAQUE IOHEXOL 300 MG/ML IV SOLN  Comparison: Two-view chest study 02/23/2005  Findings: No evidence of aortic dissection, transection, or aneurysm.  No evidence of intramural  hematoma.  Specifically, there is no intimal abnormality, mediastinal hemorrhage, or abrupt aortic caliber change.  No evidence of mediastinal adenopathy.  Mildly prominent mediastinal fat in the anterior mediastinum.  No pericardial effusion  Patchy dependent atelectasis bilaterally.  No pneumothorax.  No pleural effusion   There is a markedly comminuted fracture of T11.  This is associated with fractures of the posterior elements.  There is 15 mm anterolisthesis of T8 upon T9.  The fragmentation, retropulsion, and anterior translation above the fracture site results in severe narrowing of the central canal, which measures approximately 4 mm on image 27 of series 8.  There are no associated fractures of the spinous process sees at T7 and T8. There is a horizontal orientation elements and this fracture.  This may actually represent a Chance fracture.  Overall, there has been fracture at T9 with 1.5 cm anterior translation of the spinal column above the fracture site and this is resulted in near obliteration of the central canal space.  Neural injury is suspected.  IMPRESSION: Severe T11 vertebral injury as described.  There is involvement of the anterior and posterior elements nearly obliterating the central canal space.  This is a result of fragmentation, retropulsion, and anterior translation of the spinal canal above the fracture site. A horizontal element of the fracture raises possibility that this is a Chance fracture.  No evidence of thoracic aortic injury.  CT ABDOMEN AND PELVIS WITH CONTRAST  Technique:  Multidetector CT imaging of the abdomen and pelvis was performed using the standard protocol following bolus administration of intravenous contrast.  Findings:  No evidence of aortic injury.  Liver, gallbladder, spleen, pancreas, adrenal glands, kidneys are within normal limits. Bladder and prostate are within normal limits.  Left inguinal hernia containing adipose tissue.  No free fluid.  No abnormal  adenopathy.  No evidence of lumbar vertebral body injury or pelvic bony injury.  There are left transverse process fractures of L1 and L2.  These are minimally displaced.  Small hiatal hernia is suspected.  IMPRESSION: No evidence of intra-abdominal organ injury.  Left L1 and L2 transverse process fractures.  Original Report Authenticated By:  Donavan Burnet, M.D.    View systems is negative except as above he complains of back pain and some abdominal pain he denies neck pain. Blood pressure 118/69, pulse 90, temperature 97.4 F (36.3 C), temperature source Oral, resp. rate 20, SpO2 97.00%. Physical exam:  Gen.: A pleasant 51 year old Hispanic male who is paraplegic.  HEENT: The patient's pupils are equal round reactive light. His extraocular muscles are intact. Oropharynx benign. There is no evidence of hemotympanum, CSF otorrhea rhinorrhea , raccoon's eyes etc. Patient does have a laceration on the occipital scalp.  Neck: Supple without masses or deformities she has a fairly normal cervical range of motion. There is no pain.  Thorax: Symmetric  Heart: Regular rhythm  Abdomen: Soft  Extremities: No obvious deformities.  Neurologic exam: The patient is alert and oriented x3. Glasgow Coma Scale 15. Cranial nerves II through XII are examined bilaterally and grossly normal. The patient's vision and hearing are grossly normal bilaterally. The patient's motor strength is 5 over 5 his bile biceps triceps and hand grips. The patient has no motor strength in his lower extremities. Sensory exam demonstrates at T11 sensory level with no sensation below the  Umbilicus, there is no sacral sparing. Cerebellar functions intact to rapid only movements of the upper extremities bilaterally. The tendon reflexes are one over four in bilateral biceps and triceps. He is no reflexes in his quadriceps gastrocnemius bilaterally. There is no ankle clonus.  Imaging studies: I reviewed the patient's CT scan of his chest  abdomen and pelvis only as it pertains to his spine. Patient has a severe fracture at T11 with supple with subluxation. Spinal canal is obliterated.  Assessment/Plan: T11 fracture, complete paraplegia: I discussed the situation with the patient, his wife and daughter. Unfortunately there is no chance of neurologic recovery. I have explained this clearly to the patient and his family. I have answered all her questions. The patient will need to be stabilized at some point to aid in his rehabilitation. The patient is being admitted by the, service for observation. I discussed this case with Dr. Lindie Spruce and reviewed the films with him.  Sondra Blixt D 11/16/2011, 2:51 PM

## 2011-11-16 NOTE — ED Notes (Signed)
Pt still unable to feel or move bilate lower extremities, MD made aware

## 2011-11-16 NOTE — ED Notes (Signed)
CBG results 179

## 2011-11-16 NOTE — Progress Notes (Signed)
CSW spoke briefly with pt's daughter, Jadene Pierini, re: role of CSW/dcp.  Pt lives at home with his wife, Hanley Hays, who is a homemaker, and their three daughters, Jadene Pierini, 1, and her sisters, ages 66 and 77.  Mayrani reports that neither pt nor his wife speak english.  CSW offered emotional support to pt's daughter and explained that social work would be following pt throughout his hospitalization.

## 2011-11-16 NOTE — ED Notes (Signed)
Neurology at bedside consulting.

## 2011-11-16 NOTE — ED Notes (Signed)
Patient transported to X-ray 

## 2011-11-16 NOTE — H&P (Signed)
T-10 paraplegic from fall.  Otherwise stable.  Will admit and manage on the trauma service.  This patient has been seen and I agree with the findings and treatment plan.  Marta Lamas. Gae Bon, MD, FACS [phone] (pager) [phone] (direct pager) Trauma Surgeon

## 2011-11-16 NOTE — ED Notes (Signed)
Pt became nauseated while in CT scan. Medicated. Will continue to monitor.

## 2011-11-16 NOTE — ED Notes (Addendum)
Pt fell from 72ft A frame ladder. Pt sustained lac to posterior of head. Unknown LOC

## 2011-11-16 NOTE — ED Notes (Signed)
Pt returned from CT scan.

## 2011-11-17 ENCOUNTER — Other Ambulatory Visit: Payer: Self-pay | Admitting: Neurosurgery

## 2011-11-17 LAB — GLUCOSE, CAPILLARY
Glucose-Capillary: 165 mg/dL — ABNORMAL HIGH (ref 70–99)
Glucose-Capillary: 171 mg/dL — ABNORMAL HIGH (ref 70–99)
Glucose-Capillary: 185 mg/dL — ABNORMAL HIGH (ref 70–99)
Glucose-Capillary: 187 mg/dL — ABNORMAL HIGH (ref 70–99)

## 2011-11-17 LAB — CBC
Hemoglobin: 12.6 g/dL — ABNORMAL LOW (ref 13.0–17.0)
MCH: 30.6 pg (ref 26.0–34.0)
Platelets: 134 10*3/uL — ABNORMAL LOW (ref 150–400)
RBC: 4.12 MIL/uL — ABNORMAL LOW (ref 4.22–5.81)
WBC: 7.9 10*3/uL (ref 4.0–10.5)

## 2011-11-17 LAB — BASIC METABOLIC PANEL
CO2: 28 mEq/L (ref 19–32)
Chloride: 99 mEq/L (ref 96–112)
Glucose, Bld: 142 mg/dL — ABNORMAL HIGH (ref 70–99)
Sodium: 134 mEq/L — ABNORMAL LOW (ref 135–145)

## 2011-11-17 MED ORDER — CEFAZOLIN SODIUM 1-5 GM-% IV SOLN
1.0000 g | INTRAVENOUS | Status: DC
  Filled 2011-11-17: qty 50

## 2011-11-17 MED ORDER — MORPHINE SULFATE 4 MG/ML IJ SOLN
4.0000 mg | INTRAMUSCULAR | Status: DC | PRN
  Administered 2011-11-17 – 2011-11-18 (×2): 4 mg via INTRAVENOUS
  Filled 2011-11-17 (×3): qty 1

## 2011-11-17 MED ORDER — MORPHINE SULFATE 2 MG/ML IJ SOLN
2.0000 mg | INTRAMUSCULAR | Status: DC | PRN
  Administered 2011-11-17 (×3): 2 mg via INTRAVENOUS
  Filled 2011-11-17: qty 1
  Filled 2011-11-17: qty 2
  Filled 2011-11-17: qty 1

## 2011-11-17 MED ORDER — OXYCODONE HCL 5 MG PO TABS
5.0000 mg | ORAL_TABLET | ORAL | Status: DC | PRN
  Administered 2011-11-17: 15 mg via ORAL
  Administered 2011-11-17: 10 mg via ORAL
  Filled 2011-11-17: qty 2
  Filled 2011-11-17: qty 3

## 2011-11-17 MED ORDER — OXYCODONE HCL 5 MG PO TABS
10.0000 mg | ORAL_TABLET | ORAL | Status: DC | PRN
  Administered 2011-11-17 – 2011-11-20 (×7): 20 mg via ORAL
  Filled 2011-11-17 (×7): qty 4

## 2011-11-17 MED ORDER — HYDROMORPHONE HCL PF 1 MG/ML IJ SOLN
1.0000 mg | Freq: Once | INTRAMUSCULAR | Status: AC
  Administered 2011-11-17: 1 mg via INTRAVENOUS
  Filled 2011-11-17: qty 1

## 2011-11-17 MED ORDER — INSULIN ASPART 100 UNIT/ML ~~LOC~~ SOLN
0.0000 [IU] | Freq: Three times a day (TID) | SUBCUTANEOUS | Status: DC
  Administered 2011-11-17 – 2011-11-18 (×4): 4 [IU] via SUBCUTANEOUS
  Administered 2011-11-19 (×2): 7 [IU] via SUBCUTANEOUS
  Administered 2011-11-19 – 2011-11-20 (×2): 4 [IU] via SUBCUTANEOUS
  Administered 2011-11-20: 7 [IU] via SUBCUTANEOUS
  Filled 2011-11-17: qty 3

## 2011-11-17 MED ORDER — PREGABALIN 50 MG PO CAPS
75.0000 mg | ORAL_CAPSULE | Freq: Two times a day (BID) | ORAL | Status: DC
  Administered 2011-11-17 – 2011-11-20 (×5): 75 mg via ORAL
  Filled 2011-11-17: qty 1
  Filled 2011-11-17: qty 3
  Filled 2011-11-17 (×2): qty 1
  Filled 2011-11-17: qty 3

## 2011-11-17 MED ORDER — BACITRACIN ZINC 500 UNIT/GM EX OINT
TOPICAL_OINTMENT | Freq: Two times a day (BID) | CUTANEOUS | Status: DC
  Administered 2011-11-17 – 2011-11-20 (×6): via TOPICAL
  Filled 2011-11-17: qty 15

## 2011-11-17 NOTE — Plan of Care (Signed)
I received order from Dale [place] PA to obtain UDS and BAL from this patient if he consented to it. He said the patients worker's compensation is requesting this, but that the patient has the right to refuse. I relayed this information to the patient and his wife using the [address] translator [place]). Pt and wife consented to both studies and they were obtained.

## 2011-11-17 NOTE — Progress Notes (Signed)
Patient ID: John Meza, male   DOB: [birth year], 51 y.o.   MRN: [id number] Subjective:  The patient is alert and pleasant.  Objective: Vital signs in last 24 hours: Temp:  [97.4 F (36.3 C)-100.6 F (38.1 C)] 99.1 F (37.3 C) (02/05 0700) Pulse Rate:  [75-107] 93  (02/05 0700) Resp:  [14-20] 14  (02/05 0700) BP: (108-130)/(59-75) 120/73 mmHg (02/05 0700) SpO2:  [93 %-99 %] 95 % (02/05 0700) Weight:  [81.8 kg (180 lb 5.4 oz)] 81.8 kg (180 lb 5.4 oz) (02/04 1613)  Intake/Output from previous day: 02/04 0701 - 02/05 0700 In: 1087.5 [I.V.:1087.5] Out: 865 [Urine:865] Intake/Output this shift:    Physical exam the patient remains paraplegic without lower extremity motor strength or sensation.  Lab Results:  Basename 11/17/11 0450 11/16/11 0952  WBC 7.9 6.5  HGB 12.6* 14.2  HCT 36.5* 41.0  PLT 134* 148*   BMET  Basename 11/17/11 0450 11/16/11 0952  NA 134* 136  K 3.5 3.5  CL 99 101  CO2 28 25  GLUCOSE 142* 198*  BUN 15 12  CREATININE 0.60 0.66  CALCIUM 9.0 9.6    Studies/Results: Dg Chest 1 View  11/16/2011  *RADIOLOGY REPORT*  Clinical Data: , fall  CHEST - 1 VIEW  Comparison: 02/23/2005  Findings: Accentuated heart size and mediastinum, particular left mediastinum. The aortic arch is poorly visualized. Low lung volumes. Crowding of perihilar markings. No definite infiltrate, pleural effusion or pneumothorax. No definite fractures identified.  IMPRESSION: Prominent superior mediastinum, particularly on the left, question related to technique. Follow-up upright PA and lateral chest radiographs recommended to exclude mediastinal widening.  Original Report Authenticated By: Lollie Marrow, M.D.   Dg Chest 2 View  11/16/2011  *RADIOLOGY REPORT*  Clinical Data: Left-sided chest pain and weakness.  Question of mediastinal widening on portable exam. The patient fell.  CHEST - 2 VIEW  Comparison: 11/16/2011, 02/23/2005  Findings: The mediastinum continues appears somewhat wide.   Legrand Rams this being related to the AP position of the patient.  However, in the setting of trauma, I cannot clear the mediastinum.  Heart size is mildly enlarged.  There is pulmonary vascular congestion but no overt edema.  There are no focal consolidations.  On the lateral view, there is a fracture of T11, associated with kyphotic deformity and anterolisthesis of T10 on T11.  This is a new finding since the previous lateral exam.  Further evaluation with CT is recommended.  IMPRESSION:  1. Mediastinal width continues to be accentuated, likely related to technique.  However, given the fracture of T11, further evaluation with CT of the chest with contrast is recommended.  Reconstructed images can then be performed of the T11 level. 2.  Fracture of T11 may well be acute.  This is associated kyphosis and anterolisthesis.  See above.  Critical test results telephoned to Dr. Patria Mane at the time of interpretation on date 11/16/2011 at time 1:28 p.m.  Original Report Authenticated By: Patterson Hammersmith, M.D.   Dg Pelvis 1-2 Views  11/16/2011  *RADIOLOGY REPORT*  Clinical Data: Trauma, fall  PELVIS - 1-2 VIEW  Comparison: None  Findings: Osseous mineralization normal. Symmetric hip and SI joints. No acute fracture, dislocation, or bone destruction.  IMPRESSION: No acute bony abnormalities.  Original Report Authenticated By: Lollie Marrow, M.D.   Ct Head Wo Contrast  11/16/2011  *RADIOLOGY REPORT*  Clinical Data:  Larey Seat.  Hit head.  CT HEAD WITHOUT CONTRAST CT CERVICAL SPINE WITHOUT CONTRAST  Technique:  Multidetector  CT imaging of the head and cervical spine was performed following the standard protocol without intravenous contrast.  Multiplanar CT image reconstructions of the cervical spine were also generated.  Comparison:  None  CT HEAD  Findings: The ventricles are normal.  No extra-axial fluid collections are seen.  The brainstem and cerebellum are unremarkable.  No acute intracranial findings such as infarction or  hemorrhage.  No mass lesions.  The bony calvarium is intact.  No acute skull fracture.  Maxillary, ethmoid and sphenoid sinus disease is noted.  The posterior scalp laceration and small hematoma is noted.  No underlying skull fracture or radiopaque foreign body.  IMPRESSION: No acute intracranial findings or acute skull fracture.  CT CERVICAL SPINE  Findings: The sagittal reformatted images demonstrate normal alignment of the cervical vertebral bodies.  Disc spaces and vertebral bodies are maintained.  No acute bony findings or abnormal prevertebral soft tissue swelling.  The facets are normally aligned.  No facet or laminar fractures are seen. No large disc protrusions.  The neural foramen are patent.  The skull base C1 and C1-C2 articulations are maintained.  The dens is normal.  There are scattered cervical lymph nodes.  The lung apices are clear.  IMPRESSION: Normal alignment and no acute bony findings.  Original Report Authenticated By: P. Loralie Champagne, M.D.   Ct Angio Chest W/cm &/or Wo Cm  11/16/2011  *RADIOLOGY REPORT*  Clinical Data: Fall.  Evaluate for aortic injury.  CT ANGIOGRAPHY CHEST,CT ABDOMEN AND PELVIS WITH CONTRAST  Technique:  Multidetector CT imaging of the chest using the standard protocol during bolus administration of intravenous contrast. Multiplanar reconstructed images including MIPs were obtained and reviewed to evaluate the vascular anatomy.,Technique: Mul  Contrast: OMNIPAQUE IOHEXOL 300 MG/ML IV SOLN  Comparison: Two-view chest study 02/23/2005  Findings: No evidence of aortic dissection, transection, or aneurysm.  No evidence of intramural hematoma.  Specifically, there is no intimal abnormality, mediastinal hemorrhage, or abrupt aortic caliber change.  No evidence of mediastinal adenopathy.  Mildly prominent mediastinal fat in the anterior mediastinum.  No pericardial effusion  Patchy dependent atelectasis bilaterally.  No pneumothorax.  No pleural effusion   There is a  markedly comminuted fracture of T11.  This is associated with fractures of the posterior elements.  There is 15 mm anterolisthesis of T8 upon T9.  The fragmentation, retropulsion, and anterior translation above the fracture site results in severe narrowing of the central canal, which measures approximately 4 mm on image 27 of series 8.  There are no associated fractures of the spinous process sees at T7 and T8. There is a horizontal orientation elements and this fracture.  This may actually represent a Chance fracture.  Overall, there has been fracture at T9 with 1.5 cm anterior translation of the spinal column above the fracture site and this is resulted in near obliteration of the central canal space.  Neural injury is suspected.  IMPRESSION: Severe T11 vertebral injury as described.  There is involvement of the anterior and posterior elements nearly obliterating the central canal space.  This is a result of fragmentation, retropulsion, and anterior translation of the spinal canal above the fracture site. A horizontal element of the fracture raises possibility that this is a Chance fracture.  No evidence of thoracic aortic injury.  CT ABDOMEN AND PELVIS WITH CONTRAST  Technique:  Multidetector CT imaging of the abdomen and pelvis was performed using the standard protocol following bolus administration of intravenous contrast.  Findings:  No evidence of  aortic injury.  Liver, gallbladder, spleen, pancreas, adrenal glands, kidneys are within normal limits. Bladder and prostate are within normal limits.  Left inguinal hernia containing adipose tissue.  No free fluid.  No abnormal adenopathy.  No evidence of lumbar vertebral body injury or pelvic bony injury.  There are left transverse process fractures of L1 and L2.  These are minimally displaced.  Small hiatal hernia is suspected.  IMPRESSION: No evidence of intra-abdominal organ injury.  Left L1 and L2 transverse process fractures.  Original Report Authenticated  By: Donavan Burnet, M.D.   Ct Cervical Spine Wo Contrast  11/16/2011  *RADIOLOGY REPORT*  Clinical Data:  Larey Seat.  Hit head.  CT HEAD WITHOUT CONTRAST CT CERVICAL SPINE WITHOUT CONTRAST  Technique:  Multidetector CT imaging of the head and cervical spine was performed following the standard protocol without intravenous contrast.  Multiplanar CT image reconstructions of the cervical spine were also generated.  Comparison:  None  CT HEAD  Findings: The ventricles are normal.  No extra-axial fluid collections are seen.  The brainstem and cerebellum are unremarkable.  No acute intracranial findings such as infarction or hemorrhage.  No mass lesions.  The bony calvarium is intact.  No acute skull fracture.  Maxillary, ethmoid and sphenoid sinus disease is noted.  The posterior scalp laceration and small hematoma is noted.  No underlying skull fracture or radiopaque foreign body.  IMPRESSION: No acute intracranial findings or acute skull fracture.  CT CERVICAL SPINE  Findings: The sagittal reformatted images demonstrate normal alignment of the cervical vertebral bodies.  Disc spaces and vertebral bodies are maintained.  No acute bony findings or abnormal prevertebral soft tissue swelling.  The facets are normally aligned.  No facet or laminar fractures are seen. No large disc protrusions.  The neural foramen are patent.  The skull base C1 and C1-C2 articulations are maintained.  The dens is normal.  There are scattered cervical lymph nodes.  The lung apices are clear.  IMPRESSION: Normal alignment and no acute bony findings.  Original Report Authenticated By: P. Loralie Champagne, M.D.   Ct Abdomen Pelvis W Contrast  11/16/2011  *RADIOLOGY REPORT*  Clinical Data: Fall.  Evaluate for aortic injury.  CT ANGIOGRAPHY CHEST,CT ABDOMEN AND PELVIS WITH CONTRAST  Technique:  Multidetector CT imaging of the chest using the standard protocol during bolus administration of intravenous contrast. Multiplanar reconstructed images  including MIPs were obtained and reviewed to evaluate the vascular anatomy.,Technique: Mul  Contrast: OMNIPAQUE IOHEXOL 300 MG/ML IV SOLN  Comparison: Two-view chest study 02/23/2005  Findings: No evidence of aortic dissection, transection, or aneurysm.  No evidence of intramural hematoma.  Specifically, there is no intimal abnormality, mediastinal hemorrhage, or abrupt aortic caliber change.  No evidence of mediastinal adenopathy.  Mildly prominent mediastinal fat in the anterior mediastinum.  No pericardial effusion  Patchy dependent atelectasis bilaterally.  No pneumothorax.  No pleural effusion   There is a markedly comminuted fracture of T11.  This is associated with fractures of the posterior elements.  There is 15 mm anterolisthesis of T8 upon T9.  The fragmentation, retropulsion, and anterior translation above the fracture site results in severe narrowing of the central canal, which measures approximately 4 mm on image 27 of series 8.  There are no associated fractures of the spinous process sees at T7 and T8. There is a horizontal orientation elements and this fracture.  This may actually represent a Chance fracture.  Overall, there has been fracture at T9 with 1.5 cm  anterior translation of the spinal column above the fracture site and this is resulted in near obliteration of the central canal space.  Neural injury is suspected.  IMPRESSION: Severe T11 vertebral injury as described.  There is involvement of the anterior and posterior elements nearly obliterating the central canal space.  This is a result of fragmentation, retropulsion, and anterior translation of the spinal canal above the fracture site. A horizontal element of the fracture raises possibility that this is a Chance fracture.  No evidence of thoracic aortic injury.  CT ABDOMEN AND PELVIS WITH CONTRAST  Technique:  Multidetector CT imaging of the abdomen and pelvis was performed using the standard protocol following bolus administration  of intravenous contrast.  Findings:  No evidence of aortic injury.  Liver, gallbladder, spleen, pancreas, adrenal glands, kidneys are within normal limits. Bladder and prostate are within normal limits.  Left inguinal hernia containing adipose tissue.  No free fluid.  No abnormal adenopathy.  No evidence of lumbar vertebral body injury or pelvic bony injury.  There are left transverse process fractures of L1 and L2.  These are minimally displaced.  Small hiatal hernia is suspected.  IMPRESSION: No evidence of intra-abdominal organ injury.  Left L1 and L2 transverse process fractures.  Original Report Authenticated By: Donavan Burnet, M.D.    Assessment/Plan: T11 fracture subluxation: This fracture will need to be stabilized to aid in the patient's rehabilitation. I will tentatively plan to do this tomorrow evening if it's okay with trauma.  LOS: 1 day     Tyreik Delahoussaye D 11/17/2011, 8:10 AM

## 2011-11-17 NOTE — Progress Notes (Signed)
Patient ID: John Meza, male   DOB: 1961/02/11, 51 y.o.   MRN: [id number]   LOS: 1 day   Subjective: C/o back pain but pain meds working.  Objective: Vital signs in last 24 hours: Temp:  [97.4 F (36.3 C)-100.6 F (38.1 C)] 100.6 F (38.1 C) (02/05 0400) Pulse Rate:  [75-107] 93  (02/05 0700) Resp:  [14-20] 14  (02/05 0700) BP: (108-130)/(59-75) 120/73 mmHg (02/05 0700) SpO2:  [93 %-99 %] 95 % (02/05 0700) Weight:  [81.8 kg (180 lb 5.4 oz)] 81.8 kg (180 lb 5.4 oz) (02/04 1613)    Lab Results:  CBC  Basename 11/17/11 0450 11/16/11 0952  WBC 7.9 6.5  HGB 12.6* 14.2  HCT 36.5* 41.0  PLT 134* 148*   BMET  Basename 11/17/11 0450 11/16/11 0952  NA 134* 136  K 3.5 3.5  CL 99 101  CO2 28 25  GLUCOSE 142* 198*  BUN 15 12  CREATININE 0.60 0.66  CALCIUM 9.0 9.6   CBG (last 3)   Basename 11/17/11 0413 11/17/11 0011 11/16/11 1937  GLUCAP 151* 213* 234*     General appearance: alert and no distress Resp: clear to auscultation bilaterally Cardio: regular rate and rhythm GI: normal findings: bowel sounds normal and soft, non-tender Pulses: 2+ and symmetric  Assessment/Plan: Fall T11 fx w/paraplegia -- Will need stabilization by Dr. Lovell Sheehan, likely tomorrow evening.  Concussion Scalp laceration -- Local care DM -- Increase insulin Multiple medical problems -- Home meds FEN -- Give diet VTE -- SCD's Dispo -- Ok for OR from trauma standpoint.   Freeman Caldron, PA-C Pager: [phone] General Trauma PA Pager: [phone]   11/17/2011     HD stable.  Pain control and advance diet.  NPO after midnight and plan for OR with NSG tomorrow.

## 2011-11-17 NOTE — Progress Notes (Signed)
Clinical Social Worker completed the psychosocial assessment upon emergency room arrival which can be found in the shadow chart.  Clinical Social Worker followed up with patient and patient family at bedside, who is now on 3100.  Patient currently lives with his wife and 3 children (23,19,9).  Patient wife is a "homemaker," per daughter report, so patient will have 24 hour support at discharge.  Patient was working when he fell 8 feet off a ladder.  Per CM, patient employer has been notified and Worker's Compensation has been initiated.  Patient and patient wife are spanish speaking, however patient daughters speak fluent english and are involved and engaged in conversation.    Patient currently sleeping - once patient able to engage in conversation fully, CSW will notify interpreter to explore patient's ability to cope and manage emotions with new diagnosis of paraplegia.    No SBIRT completed at this time due to patient sleeping.  CSW to complete at a later time.  Clinical Social Worker remains available for support and discharge planning as needed.  CSW provided patient family with contact information and encouraged any questions.  Patient family appropriately coping and adequately emotional for patient situation.  Patient family appreciative for CSW concern and support.  [address] [place], [place] [phone]

## 2011-11-17 NOTE — Plan of Care (Signed)
Pts daughter called and requested that I set up a second opinion for her father. She requested Dr Avie Echevaria. I called Trauma Casimiro Needle PA and her stated that he would arrainge for a second opinion tomorrow, as the time was 17:10. I gave that information to the patient using the [employer].

## 2011-11-17 NOTE — Progress Notes (Signed)
UR of chart completed.  Spoke with pt's employer yesterday afternoon, Everrett Coombe, [phone]. He was in process of filing workers compensation paperwork.  Anticipate need for CIR at d/c. Pt has wife who is at home, 3 children.

## 2011-11-18 ENCOUNTER — Inpatient Hospital Stay (HOSPITAL_COMMUNITY): Payer: Worker's Compensation

## 2011-11-18 ENCOUNTER — Inpatient Hospital Stay (HOSPITAL_COMMUNITY): Payer: Worker's Compensation | Admitting: Anesthesiology

## 2011-11-18 ENCOUNTER — Encounter (HOSPITAL_COMMUNITY): Admission: EM | Disposition: A | Discharge status: Rehabilitation Facility | Payer: Self-pay | Source: Home / Self Care

## 2011-11-18 ENCOUNTER — Encounter (HOSPITAL_COMMUNITY): Payer: Self-pay | Admitting: Anesthesiology

## 2011-11-18 LAB — DRUGS OF ABUSE SCREEN W/O ALC, ROUTINE URINE
Amphetamine Screen, Ur: NEGATIVE
Barbiturate Quant, Ur: NEGATIVE
Benzodiazepines.: NEGATIVE
Marijuana Metabolite: NEGATIVE
Methadone: NEGATIVE
Propoxyphene: NEGATIVE

## 2011-11-18 LAB — GLUCOSE, CAPILLARY
Glucose-Capillary: 178 mg/dL — ABNORMAL HIGH (ref 70–99)
Glucose-Capillary: 185 mg/dL — ABNORMAL HIGH (ref 70–99)
Glucose-Capillary: 178 mg/dL — ABNORMAL HIGH (ref 70–99)

## 2011-11-18 LAB — ABO/RH: ABO/RH(D): O POS

## 2011-11-18 SURGERY — POSTERIOR LUMBAR FUSION 1 LEVEL
Anesthesia: General | Site: Back | Wound class: Clean

## 2011-11-18 MED ORDER — BUPIVACAINE-EPINEPHRINE PF 0.5-1:200000 % IJ SOLN
INTRAMUSCULAR | Status: DC | PRN
  Administered 2011-11-18: 20 mL

## 2011-11-18 MED ORDER — THROMBIN 20000 UNITS EX KIT
PACK | CUTANEOUS | Status: DC | PRN
  Administered 2011-11-18: 18:00:00 via TOPICAL

## 2011-11-18 MED ORDER — NALOXONE HCL 0.4 MG/ML IJ SOLN: 0.4000 mg | INTRAMUSCULAR | Status: DC | PRN

## 2011-11-18 MED ORDER — PROMETHAZINE HCL 25 MG/ML IJ SOLN: 6.2500 mg | INTRAMUSCULAR | Status: DC | PRN

## 2011-11-18 MED ORDER — ONDANSETRON HCL 4 MG/2ML IJ SOLN: 4.0000 mg | Freq: Four times a day (QID) | INTRAMUSCULAR | Status: DC | PRN

## 2011-11-18 MED ORDER — OXYCODONE-ACETAMINOPHEN 5-325 MG PO TABS: 1.0000 | ORAL_TABLET | ORAL | Status: DC | PRN

## 2011-11-18 MED ORDER — ZOLPIDEM TARTRATE 5 MG PO TABS: 10.0000 mg | ORAL_TABLET | Freq: Every evening | ORAL | Status: DC | PRN

## 2011-11-18 MED ORDER — MORPHINE SULFATE (PF) 1 MG/ML IV SOLN
INTRAVENOUS | Status: DC
  Administered 2011-11-18 – 2011-11-19 (×2): via INTRAVENOUS
  Administered 2011-11-19: 10.5 mg via INTRAVENOUS
  Administered 2011-11-19: 13.5 mg via INTRAVENOUS
  Filled 2011-11-18 (×2): qty 25

## 2011-11-18 MED ORDER — ROCURONIUM BROMIDE 100 MG/10ML IV SOLN
INTRAVENOUS | Status: DC | PRN
  Administered 2011-11-18: 20 mg via INTRAVENOUS
  Administered 2011-11-18 (×2): 10 mg via INTRAVENOUS
  Administered 2011-11-18: 20 mg via INTRAVENOUS
  Administered 2011-11-18: 10 mg via INTRAVENOUS
  Administered 2011-11-18: 50 mg via INTRAVENOUS

## 2011-11-18 MED ORDER — PROPOFOL 10 MG/ML IV BOLUS
INTRAVENOUS | Status: DC | PRN
  Administered 2011-11-18: 120 mg via INTRAVENOUS

## 2011-11-18 MED ORDER — DIPHENHYDRAMINE HCL 50 MG/ML IJ SOLN: 12.5000 mg | Freq: Four times a day (QID) | INTRAMUSCULAR | Status: DC | PRN

## 2011-11-18 MED ORDER — PHENOL 1.4 % MT LIQD: 1.0000 | OROMUCOSAL | Status: DC | PRN

## 2011-11-18 MED ORDER — BACITRACIN 50000 UNITS IM SOLR
INTRAMUSCULAR | Status: AC
  Filled 2011-11-18: qty 1

## 2011-11-18 MED ORDER — LACTATED RINGERS IV SOLN
INTRAVENOUS | Status: DC
  Administered 2011-11-19: 02:00:00 via INTRAVENOUS

## 2011-11-18 MED ORDER — PANTOPRAZOLE SODIUM 40 MG IV SOLR
40.0000 mg | Freq: Every day | INTRAVENOUS | Status: DC
  Administered 2011-11-19: 40 mg via INTRAVENOUS
  Filled 2011-11-18 (×2): qty 40

## 2011-11-18 MED ORDER — ROSUVASTATIN CALCIUM 20 MG PO TABS
20.0000 mg | ORAL_TABLET | Freq: Every day | ORAL | Status: DC
  Administered 2011-11-19: 20 mg via ORAL
  Filled 2011-11-18 (×2): qty 1

## 2011-11-18 MED ORDER — DIPHENHYDRAMINE HCL 12.5 MG/5ML PO ELIX
12.5000 mg | ORAL_SOLUTION | Freq: Four times a day (QID) | ORAL | Status: DC | PRN
  Filled 2011-11-18: qty 5

## 2011-11-18 MED ORDER — MENTHOL 3 MG MT LOZG: 1.0000 | LOZENGE | OROMUCOSAL | Status: DC | PRN

## 2011-11-18 MED ORDER — HYDROMORPHONE HCL PF 1 MG/ML IJ SOLN: 0.2500 mg | INTRAMUSCULAR | Status: DC | PRN

## 2011-11-18 MED ORDER — DIAZEPAM 5 MG PO TABS: 5.0000 mg | ORAL_TABLET | Freq: Four times a day (QID) | ORAL | Status: DC | PRN

## 2011-11-18 MED ORDER — LACTATED RINGERS IV SOLN
INTRAVENOUS | Status: DC | PRN
  Administered 2011-11-18 (×3): via INTRAVENOUS

## 2011-11-18 MED ORDER — SODIUM CHLORIDE 0.9 % IR SOLN
Status: DC | PRN
  Administered 2011-11-18: 18:00:00

## 2011-11-18 MED ORDER — MIDAZOLAM HCL 5 MG/5ML IJ SOLN
INTRAMUSCULAR | Status: DC | PRN
  Administered 2011-11-18: 2 mg via INTRAVENOUS

## 2011-11-18 MED ORDER — MORPHINE SULFATE 4 MG/ML IJ SOLN
4.0000 mg | INTRAMUSCULAR | Status: DC | PRN
  Administered 2011-11-18: 4 mg via INTRAVENOUS
  Filled 2011-11-18: qty 1

## 2011-11-18 MED ORDER — CEFAZOLIN SODIUM-DEXTROSE 2-3 GM-% IV SOLR: 2.0000 g | INTRAVENOUS | Status: DC

## 2011-11-18 MED ORDER — CEFAZOLIN SODIUM-DEXTROSE 2-3 GM-% IV SOLR
INTRAVENOUS | Status: AC
  Filled 2011-11-18: qty 50

## 2011-11-18 MED ORDER — SODIUM CHLORIDE 0.9 % IV SOLN
INTRAVENOUS | Status: AC
  Filled 2011-11-18: qty 500

## 2011-11-18 MED ORDER — CEFAZOLIN SODIUM 1-5 GM-% IV SOLN
INTRAVENOUS | Status: DC | PRN
  Administered 2011-11-18: 2 g via INTRAVENOUS

## 2011-11-18 MED ORDER — LACTATED RINGERS IV SOLN
INTRAVENOUS | Status: DC | PRN
  Administered 2011-11-18: 17:00:00 via INTRAVENOUS

## 2011-11-18 MED ORDER — NIACIN ER (ANTIHYPERLIPIDEMIC) 500 MG PO TBCR
1000.0000 mg | EXTENDED_RELEASE_TABLET | Freq: Every day | ORAL | Status: DC
  Administered 2011-11-19: 1000 mg via ORAL
  Filled 2011-11-18 (×2): qty 2

## 2011-11-18 MED ORDER — ONDANSETRON HCL 4 MG/2ML IJ SOLN: 4.0000 mg | INTRAMUSCULAR | Status: DC | PRN

## 2011-11-18 MED ORDER — 0.9 % SODIUM CHLORIDE (POUR BTL) OPTIME
TOPICAL | Status: DC | PRN
  Administered 2011-11-18: 1000 mL

## 2011-11-18 MED ORDER — CEFAZOLIN SODIUM-DEXTROSE 2-3 GM-% IV SOLR
2.0000 g | Freq: Three times a day (TID) | INTRAVENOUS | Status: AC
  Administered 2011-11-19 (×2): 2 g via INTRAVENOUS
  Filled 2011-11-18 (×3): qty 50

## 2011-11-18 MED ORDER — DOCUSATE SODIUM 100 MG PO CAPS
100.0000 mg | ORAL_CAPSULE | Freq: Two times a day (BID) | ORAL | Status: DC
  Administered 2011-11-19 – 2011-11-20 (×3): 100 mg via ORAL
  Filled 2011-11-18 (×4): qty 1

## 2011-11-18 MED ORDER — HYDROCODONE-ACETAMINOPHEN 5-325 MG PO TABS
1.0000 | ORAL_TABLET | ORAL | Status: DC | PRN
  Administered 2011-11-20: 1 via ORAL
  Filled 2011-11-18: qty 1

## 2011-11-18 MED ORDER — ONDANSETRON HCL 4 MG/2ML IJ SOLN
INTRAMUSCULAR | Status: DC | PRN
  Administered 2011-11-18: 4 mg via INTRAVENOUS

## 2011-11-18 MED ORDER — SODIUM CHLORIDE 0.9 % IJ SOLN: 9.0000 mL | INTRAMUSCULAR | Status: DC | PRN

## 2011-11-18 MED ORDER — ACETAMINOPHEN 650 MG RE SUPP
650.0000 mg | RECTAL | Status: DC | PRN
  Administered 2011-11-19: 650 mg via RECTAL
  Filled 2011-11-18: qty 1

## 2011-11-18 MED ORDER — GLYCOPYRROLATE 0.2 MG/ML IJ SOLN
INTRAMUSCULAR | Status: DC | PRN
  Administered 2011-11-18: .6 mg via INTRAVENOUS

## 2011-11-18 MED ORDER — SUFENTANIL CITRATE 50 MCG/ML IV SOLN
INTRAVENOUS | Status: DC | PRN
  Administered 2011-11-18 (×3): 10 ug via INTRAVENOUS
  Administered 2011-11-18: 25 ug via INTRAVENOUS
  Administered 2011-11-18: 15 ug via INTRAVENOUS
  Administered 2011-11-18: 10 ug via INTRAVENOUS

## 2011-11-18 MED ORDER — MORPHINE SULFATE 4 MG/ML IJ SOLN
4.0000 mg | Freq: Once | INTRAMUSCULAR | Status: AC
  Administered 2011-11-18: 4 mg via INTRAVENOUS

## 2011-11-18 MED ORDER — ACETAMINOPHEN 325 MG PO TABS
650.0000 mg | ORAL_TABLET | ORAL | Status: DC | PRN
  Administered 2011-11-19 (×2): 650 mg via ORAL
  Filled 2011-11-18 (×2): qty 2

## 2011-11-18 MED ORDER — NEOSTIGMINE METHYLSULFATE 1 MG/ML IJ SOLN
INTRAMUSCULAR | Status: DC | PRN
  Administered 2011-11-18: 4 mg via INTRAVENOUS

## 2011-11-18 SURGICAL SUPPLY — 69 items
APL SKNCLS STERI-STRIP NONHPOA (GAUZE/BANDAGES/DRESSINGS) ×1
BAG DECANTER FOR FLEXI CONT (MISCELLANEOUS) ×2 IMPLANT
BENZOIN TINCTURE PRP APPL 2/3 (GAUZE/BANDAGES/DRESSINGS) ×2 IMPLANT
BLADE SURG ROTATE 9660 (MISCELLANEOUS) IMPLANT
BRUSH SCRUB EZ PLAIN DRY (MISCELLANEOUS) ×2 IMPLANT
BUR ACORN 6.0 (BURR) ×2 IMPLANT
BUR MATCHSTICK NEURO 3.0 LAGG (BURR) ×2 IMPLANT
CANISTER SUCTION 2500CC (MISCELLANEOUS) ×2 IMPLANT
CAP REVERE LOCKING (Cap) ×8 IMPLANT
CLOSURE STERI STRIP 1/2 X4 (GAUZE/BANDAGES/DRESSINGS) ×2 IMPLANT
CLOTH BEACON ORANGE TIMEOUT ST (SAFETY) ×2 IMPLANT
CONN CROSSLINK REV 38-50MM (Connector) ×2 IMPLANT
CONNECTOR CRSLINK REV 38-50MM (Connector) IMPLANT
CONT SPEC 4OZ CLIKSEAL STRL BL (MISCELLANEOUS) ×2 IMPLANT
COVER BACK TABLE 24X17X13 BIG (DRAPES) IMPLANT
DRAPE C-ARM 42X72 X-RAY (DRAPES) ×6 IMPLANT
DRAPE LAPAROTOMY 100X72X124 (DRAPES) ×2 IMPLANT
DRAPE POUCH INSTRU U-SHP 10X18 (DRAPES) ×2 IMPLANT
DRAPE SURG 17X23 STRL (DRAPES) ×8 IMPLANT
ELECT BLADE 4.0 EZ CLEAN MEGAD (MISCELLANEOUS) ×2
ELECT REM PT RETURN 9FT ADLT (ELECTROSURGICAL) ×2
ELECTRODE BLDE 4.0 EZ CLN MEGD (MISCELLANEOUS) ×1 IMPLANT
ELECTRODE REM PT RTRN 9FT ADLT (ELECTROSURGICAL) ×1 IMPLANT
GAUZE SPONGE 4X4 12PLY STRL LF (GAUZE/BANDAGES/DRESSINGS) ×2 IMPLANT
GAUZE SPONGE 4X4 16PLY XRAY LF (GAUZE/BANDAGES/DRESSINGS) ×3 IMPLANT
GLOVE BIO SURGEON STRL SZ 6.5 (GLOVE) ×9 IMPLANT
GLOVE BIO SURGEON STRL SZ8.5 (GLOVE) ×4 IMPLANT
GLOVE BIOGEL PI IND STRL 6.5 (GLOVE) ×3 IMPLANT
GLOVE BIOGEL PI INDICATOR 6.5 (GLOVE) ×3
GLOVE ECLIPSE 6.5 STRL STRAW (GLOVE) ×4 IMPLANT
GLOVE EXAM NITRILE LRG STRL (GLOVE) IMPLANT
GLOVE EXAM NITRILE MD LF STRL (GLOVE) ×2 IMPLANT
GLOVE EXAM NITRILE XL STR (GLOVE) IMPLANT
GLOVE EXAM NITRILE XS STR PU (GLOVE) IMPLANT
GLOVE SS BIOGEL STRL SZ 8 (GLOVE) ×2 IMPLANT
GLOVE SUPERSENSE BIOGEL SZ 8 (GLOVE) ×2
GOWN BRE IMP SLV AUR LG STRL (GOWN DISPOSABLE) ×6 IMPLANT
GOWN BRE IMP SLV AUR XL STRL (GOWN DISPOSABLE) ×4 IMPLANT
GOWN STRL REIN 2XL LVL4 (GOWN DISPOSABLE) IMPLANT
KIT BASIN OR (CUSTOM PROCEDURE TRAY) ×2 IMPLANT
KIT INFUSE MEDIUM (Orthopedic Implant) ×2 IMPLANT
KIT ROOM TURNOVER OR (KITS) ×2 IMPLANT
NDL HYPO 21X1.5 SAFETY (NEEDLE) IMPLANT
NEEDLE HYPO 21X1.5 SAFETY (NEEDLE) IMPLANT
NEEDLE HYPO 22GX1.5 SAFETY (NEEDLE) ×2 IMPLANT
NS IRRIG 1000ML POUR BTL (IV SOLUTION) ×2 IMPLANT
PACK LAMINECTOMY NEURO (CUSTOM PROCEDURE TRAY) ×2 IMPLANT
PAD ARMBOARD 7.5X6 YLW CONV (MISCELLANEOUS) ×6 IMPLANT
PATTIES SURGICAL .5 X1 (DISPOSABLE) IMPLANT
PUTTY ABX ACTIFUSE 20ML (Putty) ×1 IMPLANT
ROD 6.35 300MM (Rod) ×1 IMPLANT
SCREW 7.5X50MM (Screw) ×6 IMPLANT
SCREW REVERE 6.35 6.5MMX45 (Screw) ×2 IMPLANT
SCREW REVERE 6.5X50MM (Screw) ×1 IMPLANT
SPONGE GAUZE 4X4 12PLY (GAUZE/BANDAGES/DRESSINGS) ×2 IMPLANT
SPONGE LAP 4X18 X RAY DECT (DISPOSABLE) IMPLANT
SPONGE NEURO XRAY DETECT 1X3 (DISPOSABLE) IMPLANT
SPONGE SURGIFOAM ABS GEL 100 (HEMOSTASIS) ×2 IMPLANT
STRIP CLOSURE SKIN 1/2X4 (GAUZE/BANDAGES/DRESSINGS) ×2 IMPLANT
SUT VIC AB 1 CT1 18XBRD ANBCTR (SUTURE) ×2 IMPLANT
SUT VIC AB 1 CT1 8-18 (SUTURE) ×4
SUT VIC AB 2-0 CP2 18 (SUTURE) ×5 IMPLANT
SYR 20CC LL (SYRINGE) IMPLANT
SYR 20ML ECCENTRIC (SYRINGE) ×2 IMPLANT
TAPE CLOTH SURG 4X10 WHT LF (GAUZE/BANDAGES/DRESSINGS) ×1 IMPLANT
TOWEL OR 17X24 6PK STRL BLUE (TOWEL DISPOSABLE) ×2 IMPLANT
TOWEL OR 17X26 10 PK STRL BLUE (TOWEL DISPOSABLE) ×2 IMPLANT
TRAY FOLEY CATH 14FRSI W/METER (CATHETERS) ×2 IMPLANT
WATER STERILE IRR 1000ML POUR (IV SOLUTION) ×2 IMPLANT

## 2011-11-18 NOTE — Progress Notes (Signed)
Report called to [facility] in ICU, notified regarding episodes of apnea while sleeping, sats in mid 90's, and slight tachycardia in 120's, RN ok to accept patient, Dr.Fredericks notified of patient transfer

## 2011-11-18 NOTE — Anesthesia Postprocedure Evaluation (Signed)
Anesthesia Post Note  Patient: John Meza  Procedure(s) Performed:  POSTERIOR LUMBAR FUSION 1 LEVEL - Thoracolumbar decompression and fusion with instrumentation  Anesthesia type: General  Patient location: PACU  Post pain: Pain level controlled  Post assessment: Patient's Cardiovascular Status Stable  Last Vitals:  Filed Vitals:   11/18/11 2233  BP:   Pulse: 116  Temp:   Resp: 7    Post vital signs: Reviewed and stable  Level of consciousness: alert  Complications: No apparent anesthesia complications

## 2011-11-18 NOTE — Progress Notes (Signed)
Pt resting in bed, pt has episodes of apnea while sleeping, resp 8, when stimulated resp 11, pt also has slight elevated HR at 124 while awake, Dr.Frederick notified, orders rec'd to monitor patient resp, no new med orders rec'd, will cont to monitor vitals

## 2011-11-18 NOTE — Progress Notes (Signed)
Patient ID: John Meza, male   DOB: [birth year], 51 y.o.   MRN: [id number] Subjective:  The patient is alert and pleasant and in no apparent distress.  Objective: Vital signs in last 24 hours: Temp:  [98.4 F (36.9 C)-100.2 F (37.9 C)] 99.9 F (37.7 C) (02/06 1100) Pulse Rate:  [85-103] 87  (02/06 1000) Resp:  [11-20] 16  (02/06 1000) BP: (107-134)/(62-85) 118/75 mmHg (02/06 1000) SpO2:  [92 %-96 %] 93 % (02/06 1000)  Intake/Output from previous day: 02/05 0701 - 02/06 0700 In: 2280 [P.O.:480; I.V.:1800] Out: 2195 [Urine:2195] Intake/Output this shift: Total I/O In: 235 [P.O.:10; I.V.:225] Out: 700 [Urine:700]  Physical exam has a complete paraplegia. With a T11 sensory level.  Lab Results:  Basename 11/17/11 0450 11/16/11 0952  WBC 7.9 6.5  HGB 12.6* 14.2  HCT 36.5* 41.0  PLT 134* 148*   BMET  Basename 11/17/11 0450 11/16/11 0952  NA 134* 136  K 3.5 3.5  CL 99 101  CO2 28 25  GLUCOSE 142* 198*  BUN 15 12  CREATININE 0.60 0.66  CALCIUM 9.0 9.6    Studies/Results: No results found.  Assessment/Plan: T. 11 fracture/subluxation, complete paraplegia: I discussed the situation with the patient, his wife, and 2 daughters via the telephone interpreter. We have discussed the various treatment options including surgery. I have described the surgical option of a thoracolumbar decompression instrumentation and fusion. I have discussed the risks, benefits, alternatives and likelihood of achieving our goal. I clearly explained to them that the purpose of the surgery is to stabilize his spine and that his paraplegia is going to be permanent. They understand. I have answered their questions. At the risks, benefits, and alternatives of surgery decided proceed with the operation.  LOS: 2 days     Iysha Mishkin D 11/18/2011, 2:39 PM

## 2011-11-18 NOTE — Progress Notes (Signed)
Dr Lovell Sheehan to bedside. Operation explained to pt, pt's wife and two daughters using [employer].

## 2011-11-18 NOTE — Consult Note (Signed)
[facility] CONSULT NOTE     Reason for Consult: traumatic T11 fx, paraplegia CC: traumatic fracture and paraplegia   HPI:    John Meza is an 51 y.o. male who suffered a 14foot fall off of a ladder while at work; he lost balance while trying to carry a rolled carpet up the ladder with his co-worker.  He is unable to recall exactly how he landed and briefly lost consciousness after the fall.  When he awoke, he was in severe pain and unable to move his legs.  On arrival to the ER, CT chest/abdomen/pelvis revealed a T11 fracture with subluxation.  On exam today, the pt states he continues to experience pain in his back that extends into his abdomen.  He remains unable to feel anything from the waist down and cannot move his legs.  He has no other concerns or complaints at this time.  Past Medical History  Diagnosis Date  . Diabetes mellitus   . Hypertension     History reviewed. No pertinent past surgical history.  History reviewed. No pertinent family history.  Social History:  reports that he has never smoked. He has never used smokeless tobacco. He reports that he drinks about .6 ounces of alcohol per week. He reports that he does not use illicit drugs.  No Known Allergies  Medications:    I have reviewed the patient's current medications.  Review of Systems - General ROS: negative for - chills, fatigue, fever or hot flashes Hematological and Lymphatic ROS: negative for - bruising, fatigue, jaundice or pallor Endocrine ROS: negative for - hair pattern changes, hot flashes, mood swings or skin changes Respiratory ROS: negative for - cough, hemoptysis, orthopnea or wheezing Cardiovascular ROS: negative for - dyspnea on exertion, orthopnea, palpitations or shortness of breath Gastrointestinal ROS: negative for - abdominal pain, appetite loss, blood in stools, diarrhea or hematemesis Musculoskeletal ROS: negative for - joint pain, joint stiffness,  joint swelling or muscle pain Neurological ROS: as outlined in HPI Dermatological ROS: negative for dry skin, pruritus and rash   Blood pressure 121/77, pulse 91, temperature 99.9 F (37.7 C), temperature source Oral, resp. rate 12, height 5\' 7"  (1.702 m), weight 180 lb 5.4 oz (81.8 kg), SpO2 94.00%.   Neurologic Examination:   Mental Status: Alert, oriented, thought content appropriate.  Speech fluent without evidence of aphasia.  Able to follow 3 step commands without difficulty. Cranial Nerves: II: visual fields grossly normal, pupils equal, round, reactive to light and accommodation III,IV, VI: ptosis not present, extraocular muscles extra-ocular motions intact bilaterally V,VII: smile symmetric, facial light touch sensation normal bilaterally VIII: hearing normal bilaterally IX,X: gag reflex present, uvula midline XI: trapezius strength/neck flexion strength normal bilaterally XII: tongue strength normal  Motor: Right : Upper extremity    Left:     Upper extremity 5/5 deltoid       5/5 deltoid 5/5 tricep      5/5 tricep 5/5 biceps      5/5 biceps  5/5wrist flexion     5/5 wrist flexion 5/5 wrist extension     5/5 wrist extension 5/5 hand grip      5/5 hand grip  Lower extremity     Lower extremity 0/5 hip flexor      0/5 hip flexor 0/5 hip adductors     0/5 hip adductors 0/5hip abductors  0/5hip abductors 0/5 quadricep      0/5quadriceps  0/5 hamstrings     0/5hamstrings 0/5 plantar flexion       0/5 plantar flexion 0/5plantar extension     0/5plantar extension Tone and bulk:normal tone throughout; no atrophy noted Sensory: Pinprick and light touch intact throughout, bilaterally Deep Tendon Reflexes: 2+ triceps, biceps, and brachioradialis in B/L UEs.  0/5 in bilateral LEs Babinski: equivocal bilaterally Cerebellar: normal finger-to-nose, normal rapid alternating movements   Lab Results  Component Value Date/Time   CHOL 131 09/23/2007  9:04 PM    Results for  orders placed during the hospital encounter of 11/16/11 (from the past 48 hour(s))  GLUCOSE, CAPILLARY     Status: Abnormal   Collection Time   11/17/11 12:11 AM      Component Value Range Comment   Glucose-Capillary 213 (*) 70 - 99 (mg/dL)   GLUCOSE, CAPILLARY     Status: Abnormal   Collection Time   11/17/11  4:13 AM      Component Value Range Comment   Glucose-Capillary 151 (*) 70 - 99 (mg/dL)   CBC     Status: Abnormal   Collection Time   11/17/11  4:50 AM      Component Value Range Comment   WBC 7.9  4.0 - 10.5 (K/uL)    RBC 4.12 (*) 4.22 - 5.81 (MIL/uL)    Hemoglobin 12.6 (*) 13.0 - 17.0 (g/dL)    HCT 96.0 (*) 45.4 - 52.0 (%)    MCV 88.6  78.0 - 100.0 (fL)    MCH 30.6  26.0 - 34.0 (pg)    MCHC 34.5  30.0 - 36.0 (g/dL)    RDW 09.8  11.9 - 14.7 (%)    Platelets 134 (*) 150 - 400 (K/uL)   BASIC METABOLIC PANEL     Status: Abnormal   Collection Time   11/17/11  4:50 AM      Component Value Range Comment   Sodium 134 (*) 135 - 145 (mEq/L)    Potassium 3.5  3.5 - 5.1 (mEq/L)    Chloride 99  96 - 112 (mEq/L)    CO2 28  19 - 32 (mEq/L)    Glucose, Bld 142 (*) 70 - 99 (mg/dL)    BUN 15  6 - 23 (mg/dL)    Creatinine, Ser 8.29  0.50 - 1.35 (mg/dL)    Calcium 9.0  8.4 - 10.5 (mg/dL)    GFR calc non Af Amer >90  >90 (mL/min)    GFR calc Af Amer >90  >90 (mL/min)   HEMOGLOBIN A1C     Status: Abnormal   Collection Time   11/17/11  4:50 AM      Component Value Range Comment   Hemoglobin A1C 10.8 (*) <5.7 (%)    Mean Plasma Glucose 263 (*) <117 (mg/dL)   GLUCOSE, CAPILLARY     Status: Abnormal   Collection Time   11/17/11  8:04 AM      Component Value Range Comment   Glucose-Capillary 141 (*) 70 - 99 (mg/dL)   GLUCOSE, CAPILLARY     Status: Abnormal   Collection Time   11/17/11 12:01 PM      Component Value Range Comment   Glucose-Capillary 171 (*) 70 - 99 (mg/dL)   GLUCOSE, CAPILLARY     Status: Abnormal   Collection Time   11/17/11  3:40 PM      Component Value Range Comment    Glucose-Capillary 165 (*) 70 - 99 (  mg/dL)    Comment 1 Notify RN      Comment 2 Documented in Chart     ETHANOL     Status: Normal   Collection Time   11/17/11  6:19 PM      Component Value Range Comment   Alcohol, Ethyl (B) <11  0 - 11 (mg/dL)   DRUGS OF ABUSE SCREEN W/O ALC, ROUTINE URINE     Status: Abnormal   Collection Time   11/17/11  6:31 PM      Component Value Range Comment   Marijuana Metabolite NEGATIVE  Negative     Amphetamine Screen, Ur NEGATIVE  Negative     Barbiturate Quant, Ur NEGATIVE  Negative     Methadone NEGATIVE  Negative     Benzodiazepines. NEGATIVE  Negative     Phencyclidine (PCP) NEGATIVE  Negative     Cocaine Metabolites NEGATIVE  Negative     Opiate Screen, Urine POSITIVE (*) Negative     Propoxyphene NEGATIVE  Negative     Creatinine,U 32.1     GLUCOSE, CAPILLARY     Status: Abnormal   Collection Time   11/17/11  7:34 PM      Component Value Range Comment   Glucose-Capillary 187 (*) 70 - 99 (mg/dL)   GLUCOSE, CAPILLARY     Status: Abnormal   Collection Time   11/17/11 11:32 PM      Component Value Range Comment   Glucose-Capillary 185 (*) 70 - 99 (mg/dL)   GLUCOSE, CAPILLARY     Status: Abnormal   Collection Time   11/18/11  3:14 AM      Component Value Range Comment   Glucose-Capillary 178 (*) 70 - 99 (mg/dL)   GLUCOSE, CAPILLARY     Status: Abnormal   Collection Time   11/18/11  8:08 AM      Component Value Range Comment   Glucose-Capillary 186 (*) 70 - 99 (mg/dL)   GLUCOSE, CAPILLARY     Status: Abnormal   Collection Time   11/18/11 11:44 AM      Component Value Range Comment   Glucose-Capillary 185 (*) 70 - 99 (mg/dL)   TYPE AND SCREEN     Status: Normal (Preliminary result)   Collection Time   11/18/11  3:37 PM      Component Value Range Comment   ABO/RH(D) O POS      Antibody Screen NEG      Sample Expiration 11/21/2011      Unit Number [id number]      Blood Component Type RED CELLS,LR      Unit division 00      Status of Unit ALLOCATED       Transfusion Status OK TO TRANSFUSE      Crossmatch Result Compatible      Unit Number [id number]      Blood Component Type RED CELLS,LR      Unit division 00      Status of Unit ALLOCATED      Transfusion Status OK TO TRANSFUSE      Crossmatch Result Compatible     ABO/RH     Status: Normal   Collection Time   11/18/11  3:37 PM      Component Value Range Comment   ABO/RH(D) O POS       No results found.   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  He is scheduled to go to the OR tonight for stabilization.  Would continue to optimize pain management and plan for PT/OT.  Pt may benefit from long-acting opioid analgesics once his resumes po intake.  Recovery of sensory/motor function seems unlikely at this point.  Nelda Bucks,. PGY-3 [phone]  11/18/2011, 8:20 PM

## 2011-11-18 NOTE — Progress Notes (Signed)
Trauma Service Note  Subjective: The patient is doing well.  No change in neurological status.  For operative stabilization today.  Objective: Vital signs in last 24 hours: Temp:  [98.4 F (36.9 C)-100.2 F (37.9 C)] 98.4 F (36.9 C) (02/06 0316) Pulse Rate:  [89-103] 91  (02/06 0700) Resp:  [13-20] 13  (02/06 0700) BP: (107-134)/(62-85) 120/74 mmHg (02/06 0700) SpO2:  [92 %-96 %] 94 % (02/06 0700) Last BM Date: 11/15/11  Intake/Output from previous day: 02/05 0701 - 02/06 0700 In: 2280 [P.O.:480; I.V.:1800] Out: 2195 [Urine:2195] Intake/Output this shift:    General: No acute distress  Lungs: clear.  Oxygen saturations 94% on RA  Abd: Soft, good bowel sounds, NPO for surgery today.  Extremities: No movement or sensation  Neuro: No movement or sensation in lower extremities.  Lab Results: CBC   Basename 11/17/11 0450 11/16/11 0952  WBC 7.9 6.5  HGB 12.6* 14.2  HCT 36.5* 41.0  PLT 134* 148*   BMET  Basename 11/17/11 0450 11/16/11 0952  NA 134* 136  K 3.5 3.5  CL 99 101  CO2 28 25  GLUCOSE 142* 198*  BUN 15 12  CREATININE 0.60 0.66  CALCIUM 9.0 9.6   PT/INR No results found for this basename: LABPROT:2,INR:2 in the last 72 hours ABG No results found for this basename: PHART:2,PCO2:2,PO2:2,HCO3:2 in the last 72 hours  Studies/Results: Dg Chest 1 View  11/16/2011  *RADIOLOGY REPORT*  Clinical Data: , fall  CHEST - 1 VIEW  Comparison: 02/23/2005  Findings: Accentuated heart size and mediastinum, particular left mediastinum. The aortic arch is poorly visualized. Low lung volumes. Crowding of perihilar markings. No definite infiltrate, pleural effusion or pneumothorax. No definite fractures identified.  IMPRESSION: Prominent superior mediastinum, particularly on the left, question related to technique. Follow-up upright PA and lateral chest radiographs recommended to exclude mediastinal widening.  Original Report Authenticated By: Lollie Marrow, M.D.   Dg Chest  2 View  11/16/2011  *RADIOLOGY REPORT*  Clinical Data: Left-sided chest pain and weakness.  Question of mediastinal widening on portable exam. The patient fell.  CHEST - 2 VIEW  Comparison: 11/16/2011, 02/23/2005  Findings: The mediastinum continues appears somewhat wide.  Legrand Rams this being related to the AP position of the patient.  However, in the setting of trauma, I cannot clear the mediastinum.  Heart size is mildly enlarged.  There is pulmonary vascular congestion but no overt edema.  There are no focal consolidations.  On the lateral view, there is a fracture of T11, associated with kyphotic deformity and anterolisthesis of T10 on T11.  This is a new finding since the previous lateral exam.  Further evaluation with CT is recommended.  IMPRESSION:  1. Mediastinal width continues to be accentuated, likely related to technique.  However, given the fracture of T11, further evaluation with CT of the chest with contrast is recommended.  Reconstructed images can then be performed of the T11 level. 2.  Fracture of T11 may well be acute.  This is associated kyphosis and anterolisthesis.  See above.  Critical test results telephoned to Dr. Patria Mane at the time of interpretation on date 11/16/2011 at time 1:28 p.m.  Original Report Authenticated By: Patterson Hammersmith, M.D.   Dg Pelvis 1-2 Views  11/16/2011  *RADIOLOGY REPORT*  Clinical Data: Trauma, fall  PELVIS - 1-2 VIEW  Comparison: None  Findings: Osseous mineralization normal. Symmetric hip and SI joints. No acute fracture, dislocation, or bone destruction.  IMPRESSION: No acute bony abnormalities.  Original  Report Authenticated By: Lollie Marrow, M.D.   Ct Head Wo Contrast  11/16/2011  *RADIOLOGY REPORT*  Clinical Data:  Larey Seat.  Hit head.  CT HEAD WITHOUT CONTRAST CT CERVICAL SPINE WITHOUT CONTRAST  Technique:  Multidetector CT imaging of the head and cervical spine was performed following the standard protocol without intravenous contrast.  Multiplanar CT image  reconstructions of the cervical spine were also generated.  Comparison:  None  CT HEAD  Findings: The ventricles are normal.  No extra-axial fluid collections are seen.  The brainstem and cerebellum are unremarkable.  No acute intracranial findings such as infarction or hemorrhage.  No mass lesions.  The bony calvarium is intact.  No acute skull fracture.  Maxillary, ethmoid and sphenoid sinus disease is noted.  The posterior scalp laceration and small hematoma is noted.  No underlying skull fracture or radiopaque foreign body.  IMPRESSION: No acute intracranial findings or acute skull fracture.  CT CERVICAL SPINE  Findings: The sagittal reformatted images demonstrate normal alignment of the cervical vertebral bodies.  Disc spaces and vertebral bodies are maintained.  No acute bony findings or abnormal prevertebral soft tissue swelling.  The facets are normally aligned.  No facet or laminar fractures are seen. No large disc protrusions.  The neural foramen are patent.  The skull base C1 and C1-C2 articulations are maintained.  The dens is normal.  There are scattered cervical lymph nodes.  The lung apices are clear.  IMPRESSION: Normal alignment and no acute bony findings.  Original Report Authenticated By: P. Loralie Champagne, M.D.   Ct Angio Chest W/cm &/or Wo Cm  11/17/2011  **ADDENDUM** CREATED: 11/17/2011 08:42:03  There are spinous process fractures at T8, T9, and T10.  **END ADDENDUM** SIGNED BY: Marlowe Aschoff. Hoss, M.D.    11/16/2011  *RADIOLOGY REPORT*  Clinical Data: Fall.  Evaluate for aortic injury.  CT ANGIOGRAPHY CHEST,CT ABDOMEN AND PELVIS WITH CONTRAST  Technique:  Multidetector CT imaging of the chest using the standard protocol during bolus administration of intravenous contrast. Multiplanar reconstructed images including MIPs were obtained and reviewed to evaluate the vascular anatomy.,Technique: Mul  Contrast: OMNIPAQUE IOHEXOL 300 MG/ML IV SOLN  Comparison: Two-view chest study 02/23/2005   Findings: No evidence of aortic dissection, transection, or aneurysm.  No evidence of intramural hematoma.  Specifically, there is no intimal abnormality, mediastinal hemorrhage, or abrupt aortic caliber change.  No evidence of mediastinal adenopathy.  Mildly prominent mediastinal fat in the anterior mediastinum.  No pericardial effusion  Patchy dependent atelectasis bilaterally.  No pneumothorax.  No pleural effusion   There is a markedly comminuted fracture of T11.  This is associated with fractures of the posterior elements.  There is 15 mm anterolisthesis of T8 upon T9.  The fragmentation, retropulsion, and anterior translation above the fracture site results in severe narrowing of the central canal, which measures approximately 4 mm on image 27 of series 8.  There are no associated fractures of the spinous process sees at T7 and T8. There is a horizontal orientation elements and this fracture.  This may actually represent a Chance fracture.  Overall, there has been fracture at T9 with 1.5 cm anterior translation of the spinal column above the fracture site and this is resulted in near obliteration of the central canal space.  Neural injury is suspected.  IMPRESSION: Severe T11 vertebral injury as described.  There is involvement of the anterior and posterior elements nearly obliterating the central canal space.  This is a result of fragmentation,  retropulsion, and anterior translation of the spinal canal above the fracture site. A horizontal element of the fracture raises possibility that this is a Chance fracture.  No evidence of thoracic aortic injury.  CT ABDOMEN AND PELVIS WITH CONTRAST  Technique:  Multidetector CT imaging of the abdomen and pelvis was performed using the standard protocol following bolus administration of intravenous contrast.  Findings:  No evidence of aortic injury.  Liver, gallbladder, spleen, pancreas, adrenal glands, kidneys are within normal limits. Bladder and prostate are within  normal limits.  Left inguinal hernia containing adipose tissue.  No free fluid.  No abnormal adenopathy.  No evidence of lumbar vertebral body injury or pelvic bony injury.  There are left transverse process fractures of L1 and L2.  These are minimally displaced.  Small hiatal hernia is suspected.  IMPRESSION: No evidence of intra-abdominal organ injury.  Left L1 and L2 transverse process fractures. Original Report Authenticated By: Donavan Burnet, M.D.   Ct Cervical Spine Wo Contrast  11/16/2011  *RADIOLOGY REPORT*  Clinical Data:  Larey Seat.  Hit head.  CT HEAD WITHOUT CONTRAST CT CERVICAL SPINE WITHOUT CONTRAST  Technique:  Multidetector CT imaging of the head and cervical spine was performed following the standard protocol without intravenous contrast.  Multiplanar CT image reconstructions of the cervical spine were also generated.  Comparison:  None  CT HEAD  Findings: The ventricles are normal.  No extra-axial fluid collections are seen.  The brainstem and cerebellum are unremarkable.  No acute intracranial findings such as infarction or hemorrhage.  No mass lesions.  The bony calvarium is intact.  No acute skull fracture.  Maxillary, ethmoid and sphenoid sinus disease is noted.  The posterior scalp laceration and small hematoma is noted.  No underlying skull fracture or radiopaque foreign body.  IMPRESSION: No acute intracranial findings or acute skull fracture.  CT CERVICAL SPINE  Findings: The sagittal reformatted images demonstrate normal alignment of the cervical vertebral bodies.  Disc spaces and vertebral bodies are maintained.  No acute bony findings or abnormal prevertebral soft tissue swelling.  The facets are normally aligned.  No facet or laminar fractures are seen. No large disc protrusions.  The neural foramen are patent.  The skull base C1 and C1-C2 articulations are maintained.  The dens is normal.  There are scattered cervical lymph nodes.  The lung apices are clear.  IMPRESSION: Normal alignment  and no acute bony findings.  Original Report Authenticated By: P. Loralie Champagne, M.D.   Ct Abdomen Pelvis W Contrast  11/17/2011  **ADDENDUM** CREATED: 11/17/2011 08:42:03  There are spinous process fractures at T8, T9, and T10.  **END ADDENDUM** SIGNED BY: Marlowe Aschoff. Hoss, M.D.    11/16/2011  *RADIOLOGY REPORT*  Clinical Data: Fall.  Evaluate for aortic injury.  CT ANGIOGRAPHY CHEST,CT ABDOMEN AND PELVIS WITH CONTRAST  Technique:  Multidetector CT imaging of the chest using the standard protocol during bolus administration of intravenous contrast. Multiplanar reconstructed images including MIPs were obtained and reviewed to evaluate the vascular anatomy.,Technique: Mul  Contrast: OMNIPAQUE IOHEXOL 300 MG/ML IV SOLN  Comparison: Two-view chest study 02/23/2005  Findings: No evidence of aortic dissection, transection, or aneurysm.  No evidence of intramural hematoma.  Specifically, there is no intimal abnormality, mediastinal hemorrhage, or abrupt aortic caliber change.  No evidence of mediastinal adenopathy.  Mildly prominent mediastinal fat in the anterior mediastinum.  No pericardial effusion  Patchy dependent atelectasis bilaterally.  No pneumothorax.  No pleural effusion   There is a markedly  comminuted fracture of T11.  This is associated with fractures of the posterior elements.  There is 15 mm anterolisthesis of T8 upon T9.  The fragmentation, retropulsion, and anterior translation above the fracture site results in severe narrowing of the central canal, which measures approximately 4 mm on image 27 of series 8.  There are no associated fractures of the spinous process sees at T7 and T8. There is a horizontal orientation elements and this fracture.  This may actually represent a Chance fracture.  Overall, there has been fracture at T9 with 1.5 cm anterior translation of the spinal column above the fracture site and this is resulted in near obliteration of the central canal space.  Neural injury is  suspected.  IMPRESSION: Severe T11 vertebral injury as described.  There is involvement of the anterior and posterior elements nearly obliterating the central canal space.  This is a result of fragmentation, retropulsion, and anterior translation of the spinal canal above the fracture site. A horizontal element of the fracture raises possibility that this is a Chance fracture.  No evidence of thoracic aortic injury.  CT ABDOMEN AND PELVIS WITH CONTRAST  Technique:  Multidetector CT imaging of the abdomen and pelvis was performed using the standard protocol following bolus administration of intravenous contrast.  Findings:  No evidence of aortic injury.  Liver, gallbladder, spleen, pancreas, adrenal glands, kidneys are within normal limits. Bladder and prostate are within normal limits.  Left inguinal hernia containing adipose tissue.  No free fluid.  No abnormal adenopathy.  No evidence of lumbar vertebral body injury or pelvic bony injury.  There are left transverse process fractures of L1 and L2.  These are minimally displaced.  Small hiatal hernia is suspected.  IMPRESSION: No evidence of intra-abdominal organ injury.  Left L1 and L2 transverse process fractures. Original Report Authenticated By: Donavan Burnet, M.D.    Anti-infectives: Anti-infectives     Start     Dose/Rate Route Frequency Ordered Stop   11/18/11 0000   ceFAZolin (ANCEF) IVPB 1 g/50 mL premix  Status:  Discontinued        1 g 100 mL/hr over 30 Minutes Intravenous 60 min pre-op 11/17/11 1422 11/17/11 1423          Assessment/Plan: s/p Procedure(s): POSTERIOR LUMBAR FUSION 1 LEVEL Surgery today.  Family wants a second opinion about prognosis I have asked the neurologist on call for consultation.  LOS: 2 days   Marta Lamas. Gae Bon, MD, FACS [phone] Trauma Surgeon 11/18/2011

## 2011-11-18 NOTE — Op Note (Signed)
Brief history: The patient is a 51 year old Hispanic male who fell off a ladder and suffered immediate complete paraplegia. He was worked up with CAT scans which demonstrated at T11 Chance fracture. I discussed the various treatment options with the patient and his family and recommended surgery. They have weighed the risks, benefits, and alternatives surgery decided proceed with the operation.  Preop diagnosis: T 11 fracture subluxation, Chance fracture, paraplegia  Postop diagnosis: The same  Procedure: T9-10, T10-11, T11-12, T12-L1 posterior lateral arthrodesis with local morselized autograft bone, Actifuse bone graft extender, bone morphogenic protein-soaked collagen sponges; no stairs segmental fixation from T9-L1 with globus titanium pedicle screws and rods.  Surgeon: Dr. Delma Officer  Assistant: Dr. Barbaraann Barthel  Anesthesia: Gen. endotracheal  Estimated blood loss: 300 cc  Specimens: None  Drains: None  Complications: None  Description of procedure: The patient was brought to the operating room by the anesthesia team. General endotracheal anesthesia was induced. The patient was then turned to the prone position using the Stryker table to flip him. The patient's thoracolumbar region was then prepared with Betadine scrub and Betadine solution sterile drapes were applied then injected the area to be incised with Marcaine without solution is a scalpel make a linear midline incision from approximately T8 down to L2. I then used a cart perform a bilateral subperiosteal dissection exposing the spinous process and lamina from T8-L2. Immediately evident was multiple spinous process fractures. We obtained intraoperative radiograph to confirm our location. I then used a scalpel to incise the interspinous ligament at the fracture site and then used Leksell rongeur to remove the fractured transverse processes. We saved this bone and later morselized it and use it as local autograft bone in the  fusion..  We now turned attention to the instrumentation. Under fluoroscopic guidance I cannulated the bilateral T9, T10, T12 and L1 pedicles. We then tapped the pedicles. We removed the tap and probe inside the tapped pedicle to rock cortical breaches. We then inserted 7.5 x 50 and 6.5 x 50 mm pedicle screws into the team 9 through L1 pedicles under fluoroscopic guidance. We got good bony purchase. We then cut a rods the appropriate length we then bent it to the appropriate configuration. He then connected the the unilateral pedicle screws with a rod. We secured the rod in place with the caps. We then placed a cross connector between the 2 rods.  Having completed the instrumentation we now turned attention to arthrodesis. We hospital to decorticate the lamina, pars and facets from T9 to L1. We then laid bone morphogenic protein-soaked collagen sponges over these decorticated structures. Over this we laid a, showed local morselized autograft bone that we obtained during the decompression as well as Actifuse bone extender. His completed the posterior lateral arthrodesis at T9-10, T10-11, T. 1112, T12-L1.  We then obtained hemostasis using bipolar cautery. We then removed the versa track retractor. We then reapproximated patient's thoracolumbar fascia with interrupted #1 Vicryl suture. We reapproximated patient's obtain tissue with interrupted 2-0 Vicryl. We then reapproximated the skin with Steri-Strips and benzoin. We then coated the wound with bacitracin ointment. We then applied a sterile dressing and removed the drapes. The patient was then sandwiched on the Stryker table and flipped back to the supine position. He was subsequently extubated by the anesthesia team and transported to the post anesthesia care unit in stable condition all sponge instrument and needle counts were correct at this case.

## 2011-11-18 NOTE — Transfer of Care (Signed)
Immediate Anesthesia Transfer of Care Note  Patient: John Meza  Procedure(s) Performed:  POSTERIOR LUMBAR FUSION 1 LEVEL - Thoracolumbar decompression and fusion with instrumentation  Patient Location: PACU  Anesthesia Type: General  Level of Consciousness: awake, alert  and oriented  Airway & Oxygen Therapy: Patient Spontanous Breathing and Patient connected to nasal cannula oxygen  Post-op Assessment: Report given to PACU RN, Post -op Vital signs reviewed and stable and Patient moving all extremities  Post vital signs: Reviewed and stable  Complications: No apparent anesthesia complications

## 2011-11-18 NOTE — Anesthesia Preprocedure Evaluation (Signed)
Anesthesia Evaluation  Patient identified by MRN, date of birth, ID band Patient awake    Reviewed: Allergy & Precautions, H&P , NPO status , Patient's Chart, lab work & pertinent test results  History of Anesthesia Complications Negative for: history of anesthetic complications  Airway Mallampati: III TM Distance: >3 FB Neck ROM: Full    Dental  (+) Partial Upper, Caps and Dental Advisory Given   Pulmonary Current Smoker,  clear to auscultation  Pulmonary exam normal       Cardiovascular hypertension, Pt. on medications Regular Normal    Neuro/Psych Fell 12 ft on Monday:  T11 fracture with paraplegia    GI/Hepatic Neg liver ROS, GERD-  Controlled,  Endo/Other  Diabetes mellitus-, Well Controlled, Type 2, Oral Hypoglycemic AgentsGlu 185  Renal/GU negative Renal ROS     Musculoskeletal   Abdominal (+) obese,   Peds  Hematology negative hematology ROS (+)   Anesthesia Other Findings   Reproductive/Obstetrics                           Anesthesia Physical Anesthesia Plan  ASA: III  Anesthesia Plan: General   Post-op Pain Management:    Induction: Intravenous  Airway Management Planned: Oral ETT  Additional Equipment:   Intra-op Plan:   Post-operative Plan: Extubation in OR  Informed Consent: I have reviewed the patients History and Physical, chart, labs and discussed the procedure including the risks, benefits and alternatives for the proposed anesthesia with the patient or authorized representative who has indicated his/her understanding and acceptance.   Dental advisory given  Plan Discussed with: CRNA and Surgeon  Anesthesia Plan Comments: (Plan routine monitors, GETA)        Anesthesia Quick Evaluation

## 2011-11-19 ENCOUNTER — Inpatient Hospital Stay (HOSPITAL_COMMUNITY): Payer: Worker's Compensation

## 2011-11-19 DIAGNOSIS — IMO0002 Reserved for concepts with insufficient information to code with codable children: Secondary | ICD-10-CM

## 2011-11-19 LAB — BLOOD GAS, ARTERIAL
FIO2: 100 %
O2 Saturation: 98.8 %
pCO2 arterial: 53.2 mmHg — ABNORMAL HIGH (ref 35.0–45.0)
pH, Arterial: 7.311 — ABNORMAL LOW (ref 7.350–7.450)
pO2, Arterial: 131 mmHg — ABNORMAL HIGH (ref 80.0–100.0)

## 2011-11-19 LAB — GLUCOSE, CAPILLARY
Glucose-Capillary: 244 mg/dL — ABNORMAL HIGH (ref 70–99)
Glucose-Capillary: 230 mg/dL — ABNORMAL HIGH (ref 70–99)
Glucose-Capillary: 224 mg/dL — ABNORMAL HIGH (ref 70–99)

## 2011-11-19 LAB — BASIC METABOLIC PANEL
CO2: 25 mEq/L (ref 19–32)
Calcium: 8.3 mg/dL — ABNORMAL LOW (ref 8.4–10.5)
Chloride: 100 mEq/L (ref 96–112)
Glucose, Bld: 234 mg/dL — ABNORMAL HIGH (ref 70–99)
Potassium: 4.2 mEq/L (ref 3.5–5.1)
Sodium: 135 mEq/L (ref 135–145)

## 2011-11-19 LAB — CBC
Hemoglobin: 11.3 g/dL — ABNORMAL LOW (ref 13.0–17.0)
Platelets: 112 10*3/uL — ABNORMAL LOW (ref 150–400)
RBC: 3.72 MIL/uL — ABNORMAL LOW (ref 4.22–5.81)
WBC: 5.6 10*3/uL (ref 4.0–10.5)

## 2011-11-19 LAB — HEMOGLOBIN AND HEMATOCRIT, BLOOD: HCT: 33 % — ABNORMAL LOW (ref 39.0–52.0)

## 2011-11-19 MED ORDER — LABETALOL HCL 5 MG/ML IV SOLN
INTRAVENOUS | Status: AC
  Filled 2011-11-19: qty 4

## 2011-11-19 MED ORDER — SODIUM CHLORIDE 0.9 % IV BOLUS (SEPSIS)
1000.0000 mL | Freq: Once | INTRAVENOUS | Status: AC
  Administered 2011-11-19: 1000 mL via INTRAVENOUS

## 2011-11-19 MED ORDER — BISACODYL 5 MG PO TBEC
5.0000 mg | DELAYED_RELEASE_TABLET | Freq: Every day | ORAL | Status: DC | PRN
  Filled 2011-11-19: qty 1

## 2011-11-19 MED ORDER — BISACODYL 10 MG RE SUPP
10.0000 mg | Freq: Every day | RECTAL | Status: DC
  Administered 2011-11-19: 10 mg via RECTAL
  Filled 2011-11-19: qty 1

## 2011-11-19 MED ORDER — LABETALOL HCL 5 MG/ML IV SOLN
5.0000 mg | INTRAVENOUS | Status: DC | PRN
  Administered 2011-11-19: 5 mg via INTRAVENOUS

## 2011-11-19 MED ORDER — INSULIN GLARGINE 100 UNIT/ML ~~LOC~~ SOLN
10.0000 [IU] | Freq: Every day | SUBCUTANEOUS | Status: DC
  Administered 2011-11-19: 10 [IU] via SUBCUTANEOUS

## 2011-11-19 NOTE — Progress Notes (Signed)
Trauma Service Note  Subjective: The patient had some issues with lungs last night, signficant atelectasis of his RLL  Objective: Vital signs in last 24 hours: Temp:  [97.9 F (36.6 C)-102.5 F (39.2 C)] 101 F (38.3 C) (02/07 0800) Pulse Rate:  [85-147] 118  (02/07 0800) Resp:  [8-25] 19  (02/07 0800) BP: (99-158)/(64-88) 120/77 mmHg (02/07 0800) SpO2:  [89 %-100 %] 96 % (02/07 0800) FiO2 (%):  [50 %-60 %] 60 % (02/07 0300) Last BM Date: 11/15/11  Intake/Output from previous day: 02/06 0701 - 02/07 0700 In: 4370 [P.O.:20; I.V.:4300; IV Piggyback:50] Out: 3225 [Urine:2875; Blood:350] Intake/Output this shift: Total I/O In: 75 [I.V.:75] Out: -   General: No acute distress.  Lungs: Clear, decreased in right base, no rales or rhonchi, but definitely decreased.  Oxygen saturations 97%  Abd: Soft, good bowel sounds  Extremities: No movement  Neuro: Same dense paraplegia in lower extremities  Lab Results: CBC   Basename 11/19/11 0525 11/19/11 0112 11/17/11 0450  WBC 5.6 -- 7.9  HGB 11.3* 11.3* --  HCT 33.2* 33.0* --  PLT 112* -- 134*   BMET  Basename 11/19/11 0525 11/17/11 0450  NA 135 134*  K 4.2 3.5  CL 100 99  CO2 25 28  GLUCOSE 234* 142*  BUN 12 15  CREATININE 0.70 0.60  CALCIUM 8.3* 9.0   PT/INR No results found for this basename: LABPROT:2,INR:2 in the last 72 hours ABG  Basename 11/19/11 0030  PHART 7.311*  HCO3 26.1*    Studies/Results: Dg Lumbar Spine 2-3 Views  11/18/2011  *RADIOLOGY REPORT*  Clinical Data: T11 fracture  LUMBAR SPINE - 2-3 VIEW  Comparison: CT 11/16/2011  Findings: A series of four intraoperative fluoroscopic spot images document posterior fusion with bilateral pedicle screws at T9, T10, L1, and L2 with vertical interconnecting rods, intact.  T11 fracture is poorly visualized.  IMPRESSION:  1.  Posterior spinal fusion T9-L2.  Original Report Authenticated By: Osa Craver, M.D.   Dg Lumbar Spine 1 View  11/18/2011   *RADIOLOGY REPORT*  Clinical Data: T11 fracture  LUMBAR SPINE - 1 VIEW  Comparison: 11/16/2011  Findings: On this single lateral intraoperative radiograph, a linear metallic density projects across the T12-L1 interspace. Comminuted fracture of the T11 vertebral body is noted, partially obscured by overlying marker.  IMPRESSION:  1.  Intraoperative localization.  Original Report Authenticated By: Osa Craver, M.D.   Dg Chest Port 1 View  11/19/2011  *RADIOLOGY REPORT*  Clinical Data: Difficulty breathing, decreased sats  PORTABLE CHEST - 1 VIEW  Comparison: 11/16/2011  Findings: There is a probable right pleural effusion and new dense consolidation / atelectasis at the right lung base. There is some associated volume loss with mild rightward mediastinal shift.  Mild cardiomegaly stable.  Patchy interstitial opacities at the left lung base.  Interval placement of spinal fusion hardware across the thoracolumbar junction.  IMPRESSION:  1.  New right lower lung atelectasis/consolidation with volume loss and suspected pleural effusion.  Original Report Authenticated By: Osa Craver, M.D.   Dg C-arm Gt 120 Min  11/18/2011  CLINICAL DATA: T9-L1 fusion   C-ARM GT 120 MIN  Fluoroscopy was utilized by the requesting physician.  No radiographic  interpretation.      Anti-infectives: Anti-infectives     Start     Dose/Rate Route Frequency Ordered Stop   11/19/11 0200   ceFAZolin (ANCEF) IVPB 2 g/50 mL premix        2  g 100 mL/hr over 30 Minutes Intravenous Every 8 hours 11/18/11 2334 11/19/11 1759   11/18/11 2345   ceFAZolin (ANCEF) IVPB 2 g/50 mL premix  Status:  Discontinued        2 g 100 mL/hr over 30 Minutes Intravenous 60 min pre-op 11/18/11 2336 11/18/11 2345   11/18/11 1820   bacitracin 50,000 Units in sodium chloride irrigation 0.9 % 500 mL irrigation  Status:  Discontinued          As needed 11/18/11 1820 11/18/11 2147   11/18/11 1748   ceFAZolin (ANCEF) 2-3 GM-% IVPB SOLR      Comments: WILLIAMS, ALANA: cabinet override         11/18/11 1748 11/19/11 0559   11/18/11 1541   bacitracin 40981 UNITS injection     Comments: Worthy Flank: cabinet override         11/18/11 1541 11/19/11 0344   11/18/11 0000   ceFAZolin (ANCEF) IVPB 1 g/50 mL premix  Status:  Discontinued        1 g 100 mL/hr over 30 Minutes Intravenous 60 min pre-op 11/17/11 1422 11/17/11 1423          Assessment/Plan: s/p Procedure(s): POSTERIOR LUMBAR FUSION 1 LEVEL Advance diet Elevated HOP per Dr. Lovell Sheehan orders Get OT/PT involved once patient has been cleared by Dr. Lovell Sheehan.  May need brace. CIR is very appropriate. Hyperglycemia, on Lantus and sliding scale   LOS: 3 days   Marta Lamas. Gae Bon, MD, FACS [phone] Trauma Surgeon 11/19/2011

## 2011-11-19 NOTE — Evaluation (Signed)
Occupational Therapy Evaluation Patient Details Name: John Meza MRN: [id number] DOB: [birth year] Today's Date: 11/19/2011  Problem List:  Patient Active Problem List  Diagnoses  . DM, UNCOMPLICATED, TYPE II, UNCONTROLLED  . HYPERLIPIDEMIA  . OBESITY, MODERATE  . ALLERGIC RHINITIS  . DYSPEPSIA  . T11 fx w/paraplegia  . Concussion with brief LOC  . Occipital scalp laceration  . Accidental fall from ladder    Past Medical History:  Past Medical History  Diagnosis Date  . Diabetes mellitus   . Hypertension    Past Surgical History: History reviewed. No pertinent past surgical history.  OT Assessment/Plan/Recommendation OT Assessment Clinical Impression Statement: 51 yo male s/p fall from 10 ft with spinal cord injury at T7 level complete. Pt underwent T9-L1 posterior lateral arthrodesis with bone graft on 02/06 pm. Pt currently don TLSO supine with HOB >30 degrees. Pt is spanish speaking and requires interrupter for session. Pt could benefit from abdominal binder and ace wrapping to maintain BP. Pt could benefit from skilled OT and CIR consult.   OT Recommendation/Assessment: Patient will need skilled OT in the acute care venue OT Problem List: Decreased strength;Decreased activity tolerance;Impaired balance (sitting and/or standing);Decreased coordination;Decreased knowledge of use of DME or AE;Impaired tone;Impaired sensation OT Therapy Diagnosis : Generalized weakness;Other (comment) (paraplegia) OT Plan OT Frequency: Min 2X/week OT Treatment/Interventions: Self-care/ADL training;Neuromuscular education;DME and/or AE instruction;Therapeutic activities;Patient/family education;Balance training OT Recommendation Recommendations for Other Services: Rehab consult Follow Up Recommendations: Inpatient Rehab Equipment Recommended: Defer to next venue Individuals Consulted Consulted and Agree with Results and Recommendations: Family member/caregiver;Patient OT Goals Acute Rehab OT  Goals OT Goal Formulation: With patient/family Time For Goal Achievement: 2 weeks Miscellaneous OT Goals Miscellaneous OT Goal #1: Pt will complete LOG roll to don brace with Mod A as precursor to adls OT Goal: Miscellaneous Goal #1 - Progress: Goal set today Miscellaneous OT Goal #2: Pt will sit EOB Min Guard A for ~3 minutes as precursor to w/c level ADLS OT Goal: Miscellaneous Goal #2 - Progress: Goal set today Miscellaneous OT Goal #3: Pt will use BIl UE to scoot to [facility] Total +2 Pt 20% as precursor to mobility into chair sliding board transfers. OT Goal: Miscellaneous Goal #3 - Progress: Goal set today  OT Evaluation Precautions/Restrictions  Precautions Precautions: Back;Fall Required Braces or Orthoses: Yes Spinal Brace: Thoracolumbosacral orthotic Restrictions Weight Bearing Restrictions: No Prior Functioning Home Living Lives With: Spouse Receives Help From: Family Type of Home: House Home Layout: One level Home Access: Stairs to enter Entrance Stairs-Rails: None Secretary/administrator of Steps: 3 Bathroom Shower/Tub: Publishing copy: Standard Bathroom Accessibility: Yes How Accessible: Accessible via wheelchair Home Adaptive Equipment: Crutches Prior Function Level of Independence: Independent with basic ADLs;Independent with gait;Independent with transfers Able to Take Stairs?: Reciprically Driving: Yes Vocation: Full time employment ADL ADL Eating/Feeding: Simulated;Set up Eating/Feeding Details (indicate cue type and reason): wife present and assisting pt Where Assessed - Eating/Feeding: Bed level Grooming: Simulated;Wash/dry face;Set up Where Assessed - Grooming: Supine, head of bed up Toilet Transfer: +1 Total assistance;Simulated (due to incontinence with spinal cord level of injury) Toilet Transfer Details (indicate cue type and reason): Pt currently with foley and could benefit from bowel and bladder program Toilet Transfer  Method: Not assessed Ambulation Related to ADLs: none ADL Comments: Pt provided interrupter on the phone to translate into spanish. pt does understand some english statements demonstrating by responding to question prior to translation. Pt with HOB gradually increased during session. HOB 25 degrees 118/75 HOB 40  112/74 HOB 50 117/76 Sitting Max (A) EOB 110/90 waiting ~2 mintues BP 129/79 Pt with SCD remaining on during entire session. Pt could benefit from ace wrapping bil LE and abdominal binder for next session. Pt tolerating activity well. Vision/Perception  Vision - History Baseline Vision: No visual deficits Patient Visual Report: No change from baseline Cognition Cognition Arousal/Alertness: Awake/alert Overall Cognitive Status: Appears within functional limits for tasks assessed Orientation Level: Oriented X4 Sensation/Coordination Sensation Light Touch: Appears Intact Additional Comments: Pt without sensation from belly button down to toes Coordination Gross Motor Movements are Fluid and Coordinated: No Fine Motor Movements are Fluid and Coordinated: Yes Extremity Assessment RUE Assessment RUE Assessment: Within Functional Limits (grossly) LUE Assessment LUE Assessment: Within Functional Limits (grossly) Mobility  Bed Mobility Bed Mobility: Yes Rolling Right: 1: +2 Total assist;Patient percentage (comment) (20) Rolling Right Details (indicate cue type and reason): Pt activating UE and reaching toward Rt side with min v/c  Rolling Left: 1: +2 Total assist;Other (comment) (20) Rolling Left Details (indicate cue type and reason): Pt activating UE and reaching toward Rt side with min v/c  Supine to Sit: 1: +2 Total assist;Patient percentage (comment);Other (comment) (10) Supine to Sit Details (indicate cue type and reason): Pt placing UE on bed surface for support and requiring extensive assist due to decreased core strength Scooting to HOB: 1: +2 Total assist;Patient percentage  (comment) (less than 10% Pt placing hands and attempting to initiate ) Transfers Transfers: No Exercises   End of Session OT - End of Session Equipment Utilized During Treatment: Back brace Activity Tolerance: Patient tolerated treatment well Patient left: in bed;with call bell in reach;with family/visitor present (wife) General Behavior During Session: [facility] for tasks performed Cognition: [facility] for tasks performed   Lucile Shutters 11/19/2011, 2:22 PM  Pager: [phone]

## 2011-11-19 NOTE — Progress Notes (Signed)
Patient repositioned and anterior back dressing remained intake. Blood was on pad underneath which was changed. Hgb and Hct was drawn earlier this AM. Will continue to monitor and assess.

## 2011-11-19 NOTE — Progress Notes (Signed)
Physical Therapy Evaluation Patient Details Name: John Meza MRN: [id number] DOB: October 26, 1960 Today's Date: 11/19/2011  Problem List:  Patient Active Problem List  Diagnoses  . DM, UNCOMPLICATED, TYPE II, UNCONTROLLED  . HYPERLIPIDEMIA  . OBESITY, MODERATE  . ALLERGIC RHINITIS  . DYSPEPSIA  . T11 fx w/paraplegia  . Concussion with brief LOC  . Occipital scalp laceration  . Accidental fall from ladder    Past Medical History:  Past Medical History  Diagnosis Date  . Diabetes mellitus   . Hypertension    Past Surgical History: History reviewed. No pertinent past surgical history.  PT Assessment/Plan/Recommendation PT Assessment Clinical Impression Statement: Pt presents with a medical diagnosis of T11 fx with paraplegia and a complete SCI. Pt has no sensation or strength of his LEs along with decreased flexibility R>L. Pt is extremely motivated and willing to participate. Pt will benefit from skilled PT in the acute care setting in order to maximize his mobility and functional independence with transfers prior to d/c to rehabilitation.  PT Recommendation/Assessment: Patient will need skilled PT in the acute care venue PT Problem List: Decreased range of motion;Decreased strength;Decreased mobility;Decreased balance;Decreased knowledge of precautions;Pain PT Therapy Diagnosis : Acute pain;Generalized weakness PT Plan PT Frequency: Min 4X/week PT Treatment/Interventions: Therapeutic activities;Therapeutic exercise;Functional mobility training;Balance training;Neuromuscular re-education;Patient/family education;Wheelchair mobility training PT Recommendation Recommendations for Other Services: Rehab consult Follow Up Recommendations: Inpatient Rehab Equipment Recommended: Defer to next venue PT Goals  Acute Rehab PT Goals PT Goal Formulation: With patient/family Time For Goal Achievement: 2 weeks Pt will Roll Supine to Right Side: with supervision PT Goal: Rolling Supine to  Right Side - Progress: Goal set today Pt will Roll Supine to Left Side: with supervision PT Goal: Rolling Supine to Left Side - Progress: Goal set today Pt will go Supine/Side to Sit: with min assist PT Goal: Supine/Side to Sit - Progress: Goal set today Pt will Sit at Edge of Bed: with min assist;3-5 min;with bilateral upper extremity support PT Goal: Sit at Edge Of Bed - Progress: Goal set today Pt will Transfer Bed to Chair/Chair to Bed: with mod assist;Other (comment) (with sliding board) PT Transfer Goal: Bed to Chair/Chair to Bed - Progress: Goal set today Additional Goals Additional Goal #1: Family will be able to demonstrate PROM for LEs in order to improve pts functional mobility through long sitting position PT Goal: Additional Goal #1 - Progress: Goal set today  PT Evaluation Precautions/Restrictions  Precautions Precautions: Back;Fall Required Braces or Orthoses: Yes Spinal Brace: Thoracolumbosacral orthotic Restrictions Weight Bearing Restrictions: No Prior Functioning  Home Living Lives With: Spouse Receives Help From: Family Type of Home: House Home Layout: One level Home Access: Stairs to enter Entrance Stairs-Rails: None Secretary/administrator of Steps: 3 Bathroom Shower/Tub: Publishing copy: Standard Bathroom Accessibility: Yes How Accessible: Accessible via wheelchair Home Adaptive Equipment: Crutches Prior Function Level of Independence: Independent with basic ADLs;Independent with gait;Independent with transfers Able to Take Stairs?: Reciprically Driving: Yes Vocation: Full time employment Cognition Cognition Arousal/Alertness: Awake/alert Overall Cognitive Status: Appears within functional limits for tasks assessed Orientation Level: Oriented X4 Sensation/Coordination Sensation Light Touch: Appears Intact Additional Comments: Pt without sensation from belly button down to toes Coordination Gross Motor Movements are Fluid  and Coordinated: No Fine Motor Movements are Fluid and Coordinated: Yes Extremity Assessment RUE Assessment RUE Assessment: Within Functional Limits (grossly) LUE Assessment LUE Assessment: Within Functional Limits (grossly) RLE Assessment RLE Assessment: Exceptions to [facility] RLE AROM (degrees) Overall AROM Right Lower Extremity: Deficits;Due to  decreased strength RLE Overall AROM Comments: no AROM; strength 0/5 RLE PROM (degrees) Overall PROM Right Lower Extremity: Deficits RLE Overall PROM Comments: Hamstring flexibility 50 degrees; Normal ankle and hip ROM RLE Strength RLE Overall Strength: Deficits RLE Overall Strength Comments: 0/5 overall LLE Assessment LLE Assessment: Exceptions to WFL LLE AROM (degrees) Overall AROM Left Lower Extremity: Deficits;Due to decreased strength LLE Overall AROM Comments: no AROM; strength 0/5 LLE PROM (degrees) Overall PROM Left Lower Extremity: Deficits LLE Overall PROM Comments: Hamstring length ~65 degrees LLE Strength LLE Overall Strength: Deficits LLE Overall Strength Comments: 0/5 overall Mobility (including Balance) Bed Mobility Bed Mobility: Yes Rolling Right: 1: +2 Total assist;Patient percentage (comment) (20-40) Rolling Right Details (indicate cue type and reason): Pt activating UE and reaching toward Rt side with min v/c  (40% at end of session) Rolling Left: 1: +2 Total assist;Other (comment) (20-40) Rolling Left Details (indicate cue type and reason): Pt activating UE and reaching toward Rt side with min v/c  (40% at end of session) Supine to Sit: 1: +2 Total assist;Patient percentage (comment);Other (comment) (10) Supine to Sit Details (indicate cue type and reason): Pt placing UE on bed surface for support and requiring extensive assist due to decreased core strength Scooting to HOB: 1: +2 Total assist;Patient percentage (comment) (less than 10% Pt placing hands and attempting to initiate ) Scooting to [facility] Details (indicate cue  type and reason): Pt attempting to assist, although required assist x 2 with drawsheet to move towards [facility] Transfers Transfers: No  Balance Balance Assessed: Yes Static Sitting Balance Static Sitting - Balance Support: Bilateral upper extremity supported;Feet supported (ft on ground; pt unable to support through LEs) Static Sitting - Level of Assistance: 2: Max assist Static Sitting - Comment/# of Minutes: Pt required max assist to maintain sitting balance at EOB, although at times he was able to support himself with min guard for ~5 seconds. Exercise    End of Session PT - End of Session Equipment Utilized During Treatment: Back brace (TLSO) Activity Tolerance: Patient tolerated treatment well Patient left: in bed;with call bell in reach;with family/visitor present Nurse Communication: Mobility status for transfers General Behavior During Session: [facility] for tasks performed Cognition: [facility] for tasks performed  Milana Kidney 11/19/2011, 6:24 PM  11/19/2011 Milana Kidney DPT PAGER: [phone] OFFICE: [phone]

## 2011-11-19 NOTE — Progress Notes (Signed)
  Johnston, Shani Fitch Brynn   OTR/L Pager: [phone] Office: [phone] .  

## 2011-11-19 NOTE — Progress Notes (Signed)
Orthopedic Tech Progress Note Patient Details:  John Meza 1961/01/07 [id number]  Other Ortho Devices Type of Ortho Device: Other (comment) (abdominal binder) Ortho Device Location: abdomen Ortho Device Interventions: Ordered Viewed order from rn order list  Nikki Dom 11/19/2011, 2:48 PM

## 2011-11-19 NOTE — Progress Notes (Signed)
Trauma MD called about Stat CXR. New telephone order given for Bipap. No new orders given for HR still maintaining in the high 130s- 140s. Will continue to monitor and assess. Respiratory coming to put patient on Bipap.   Andrey Campanile, Veasna Santibanez Alaine

## 2011-11-19 NOTE — Progress Notes (Signed)
PT/OT Cancellation Note  Treatment cancelled today due to medical issues with patient which prohibited therapy. Pt currently does not have his TLSO. Awaiting TLSO prior to evaluation.  Milana Kidney, PT 11/19/2011, 10:03 AM  Jiles Harold, OT

## 2011-11-19 NOTE — Consult Note (Signed)
Physical Medicine and Rehabilitation Consult Reason for Consult: T11 Fracture Referring Phsyician: Dr. Lovell Sheehan    HPI: John Meza is an 51 y.o. male with history of DM, obesity, who was at work on a ladder on 02./04,  when he lost his balance and fell 10 feet with +LOC briefly.  Patient with inability to move BLE with numbness and pain posterior head.  X- rays in ED with  Markedly comminuted T11 fracture with  Retropulsion and anterior tanslation of spinal canal with near obliteration of central canal.  Evaluated by Dr. Lovell Sheehan  And patient underwent T9-L1 posterior lateral arthrodesis with bone graft on 02/06 pm.  Post op with hypoxia requiring BIPAP. CXR done this am with RLL consolidation with volume loss-?effusion.  PT/OT evaluations today? MD recommending CIR.  Review of Systems  HENT: Negative for hearing loss.   Eyes: Negative for blurred vision and double vision.  Respiratory: Negative for cough and shortness of breath.   Cardiovascular: Positive for chest pain (with deep breath and due to back pain). Negative for palpitations.  Gastrointestinal: Positive for constipation. Negative for heartburn, nausea and abdominal pain.  Genitourinary: Negative for urgency and frequency.  Musculoskeletal: Positive for back pain.  Neurological: Positive for sensory change, seizures and weakness. Negative for headaches.  Psychiatric/Behavioral: Negative for depression and memory loss. The patient is not nervous/anxious.   All other systems reviewed and are negative.   Past Medical History  Diagnosis Date  . Diabetes mellitus   . Hypertension     Surgical history: negative.  History reviewed. No pertinent family history.  Social History:  Married.  Non english speaking.  He reports that he has smokes a couple of cigarettes daily.  He has never used smokeless tobacco. He reports that he drinks about .6 ounces of alcohol per week. He reports that he does not use illicit drugs.  Wife does not work  and can assist past discharge.  Three daughters at home.   No Known Allergies  Prior to Admission medications   Medication Sig Start Date End Date Taking? Authorizing Provider  lisinopril (PRINIVIL,ZESTRIL) 10 MG tablet Take 10 mg by mouth daily.   Yes Historical Provider, MD  niacin (NIASPAN) 1000 MG CR tablet Take 1,000 mg by mouth at bedtime.   Yes Historical Provider, MD  pioglitazone (ACTOS) 45 MG tablet Take 45 mg by mouth daily.   Yes Historical Provider, MD  rosuvastatin (CRESTOR) 20 MG tablet Take 20 mg by mouth daily.   Yes Historical Provider, MD  simvastatin (ZOCOR) 20 MG tablet Take 20 mg by mouth every evening.   Yes Historical Provider, MD  metFORMIN (GLUCOPHAGE) 1000 MG tablet Take 1,000 mg by mouth 2 (two) times daily with a meal.    Historical Provider, MD   Scheduled Medications:    . bacitracin      . bacitracin   Topical BID  . ceFAZolin      .  ceFAZolin (ANCEF) IV  2 g Intravenous Q8H  . docusate sodium  100 mg Oral BID  . influenza  inactive virus vaccine  0.5 mL Intramuscular Tomorrow-1000  . insulin aspart  0-20 Units Subcutaneous TID WC  . insulin glargine  5 Units Subcutaneous QHS  . labetalol      . morphine   Intravenous Q4H  .  morphine injection  4 mg Intravenous Once  . niacin  1,000 mg Oral QHS  . pantoprazole  40 mg Oral Q1200  . pantoprazole (PROTONIX) IV  40 mg Intravenous  QHS  . pneumococcal 23 valent vaccine  0.5 mL Intramuscular Tomorrow-1000  . pregabalin  75 mg Oral BID  . rosuvastatin  20 mg Oral q1800  . sodium chloride  1,000 mL Intravenous Once  . sodium chloride      . DISCONTD:  ceFAZolin (ANCEF) IV  2 g Intravenous 60 min Pre-Op  . DISCONTD: pantoprazole (PROTONIX) IV  40 mg Intravenous Q1200   PRN MED's: acetaminophen, acetaminophen, diazepam, diphenhydrAMINE, diphenhydrAMINE, HYDROcodone-acetaminophen, labetalol, menthol-cetylpyridinium, morphine, naloxone, ondansetron (ZOFRAN) IV, ondansetron (ZOFRAN) IV, ondansetron (ZOFRAN)  IV, ondansetron, oxyCODONE, oxyCODONE-acetaminophen, phenol, sodium chloride, zolpidem, DISCONTD: 0.9 % irrigation (POUR BTL), DISCONTD: bacitracin irrigation, DISCONTD: Bupivacaine-Epinephrine PF, DISCONTD: HYDROmorphone DISCONTD: morphine, DISCONTD: promethazine, DISCONTD: Surgifoam 100 with Thrombin 20,000 units (20 ml) topical solution Home:  One level home with 1 STE. No stairs inside  Functional History:  Independent PTA.  Works in a [employer].  Drives. Functional Status:  Mobility:          ADL:    Cognition: Cognition Orientation Level: Oriented to person Cognition Orientation Level: Oriented to person  Blood pressure 99/72, pulse 119, temperature 102.5 F (39.2 C), temperature source Axillary, resp. rate 15, height 5\' 7"  (1.702 m), weight 81.8 kg (180 lb 5.4 oz), SpO2 96.00%. Physical Exam  Nursing note and vitals reviewed. Constitutional: He is oriented to person, place, and time. He appears well-developed and well-nourished.  HENT:  Head: Normocephalic.  Eyes: Pupils are equal, round, and reactive to light.  Neck: Normal range of motion. Neck supple.  Cardiovascular: Normal rate.   Pulmonary/Chest:       Decreased breath sounds RLL.  Dyspnea with deep inspiration.   Abdominal: Soft. Bowel sounds are normal. He exhibits no distension. There is no tenderness.  Musculoskeletal: He exhibits no edema.  Neurological: He is alert and oriented to person, place, and time. He displays abnormal reflex.       Paraparesis. Insensate below the T11/T12 level. He has 0 out of 5 strength in both lower extremities. Areflexic-BLE. Upper extremity motor and sensory exam are intact. Cognitively he is appropriate. There is a language barrier.   Skin: Skin is warm and dry.  Psychiatric: He has a normal mood and affect. His behavior is normal. Thought content normal.    Results for orders placed during the hospital encounter of 11/16/11 (from the past 24 hour(s))  GLUCOSE, CAPILLARY      Status: Abnormal   Collection Time   11/18/11  8:08 AM      Component Value Range   Glucose-Capillary 186 (*) 70 - 99 (mg/dL)  GLUCOSE, CAPILLARY     Status: Abnormal   Collection Time   11/18/11 11:44 AM      Component Value Range   Glucose-Capillary 185 (*) 70 - 99 (mg/dL)  TYPE AND SCREEN     Status: Normal (Preliminary result)   Collection Time   11/18/11  3:37 PM      Component Value Range   ABO/RH(D) O POS     Antibody Screen NEG     Sample Expiration 11/21/2011     Unit Number [id number]     Blood Component Type RED CELLS,LR     Unit division 00     Status of Unit ALLOCATED     Transfusion Status OK TO TRANSFUSE     Crossmatch Result Compatible     Unit Number [id number]     Blood Component Type RED CELLS,LR     Unit division 00     Status of  Unit ALLOCATED     Transfusion Status OK TO TRANSFUSE     Crossmatch Result Compatible    ABO/RH     Status: Normal   Collection Time   11/18/11  3:37 PM      Component Value Range   ABO/RH(D) O POS    GLUCOSE, CAPILLARY     Status: Abnormal   Collection Time   11/18/11  6:44 PM      Component Value Range   Glucose-Capillary 155 (*) 70 - 99 (mg/dL)  GLUCOSE, CAPILLARY     Status: Abnormal   Collection Time   11/18/11 10:00 PM      Component Value Range   Glucose-Capillary 178 (*) 70 - 99 (mg/dL)  BLOOD GAS, ARTERIAL     Status: Abnormal   Collection Time   11/19/11 12:30 AM      Component Value Range   FIO2 100.00     Delivery systems NON-REBREATHER OXYGEN MASK     pH, Arterial 7.311 (*) 7.350 - 7.450    pCO2 arterial 53.2 (*) 35.0 - 45.0 (mmHg)   pO2, Arterial 131.0 (*) 80.0 - 100.0 (mmHg)   Bicarbonate 26.1 (*) 20.0 - 24.0 (mEq/L)   TCO2 27.7  0 - 100 (mmol/L)   Acid-Base Excess 0.5  0.0 - 2.0 (mmol/L)   O2 Saturation 98.8     Patient temperature 98.6     Collection site RIGHT RADIAL     Drawn by COLLECTED BY RT     Sample type ARTERIAL DRAW     Allens test (pass/fail) PASS  PASS   HEMOGLOBIN AND HEMATOCRIT, BLOOD      Status: Abnormal   Collection Time   11/19/11  1:12 AM      Component Value Range   Hemoglobin 11.3 (*) 13.0 - 17.0 (g/dL)   HCT 40.9 (*) 81.1 - 52.0 (%)  CBC     Status: Abnormal   Collection Time   11/19/11  5:25 AM      Component Value Range   WBC 5.6  4.0 - 10.5 (K/uL)   RBC 3.72 (*) 4.22 - 5.81 (MIL/uL)   Hemoglobin 11.3 (*) 13.0 - 17.0 (g/dL)   HCT 91.4 (*) 78.2 - 52.0 (%)   MCV 89.2  78.0 - 100.0 (fL)   MCH 30.4  26.0 - 34.0 (pg)   MCHC 34.0  30.0 - 36.0 (g/dL)   RDW 95.6  21.3 - 08.6 (%)   Platelets 112 (*) 150 - 400 (K/uL)  BASIC METABOLIC PANEL     Status: Abnormal   Collection Time   11/19/11  5:25 AM      Component Value Range   Sodium 135  135 - 145 (mEq/L)   Potassium 4.2  3.5 - 5.1 (mEq/L)   Chloride 100  96 - 112 (mEq/L)   CO2 25  19 - 32 (mEq/L)   Glucose, Bld 234 (*) 70 - 99 (mg/dL)   BUN 12  6 - 23 (mg/dL)   Creatinine, Ser 5.78  0.50 - 1.35 (mg/dL)   Calcium 8.3 (*) 8.4 - 10.5 (mg/dL)   GFR calc non Af Amer >90  >90 (mL/min)   GFR calc Af Amer >90  >90 (mL/min)   Dg Lumbar Spine 2-3 Views  11/18/2011  *RADIOLOGY REPORT*  Clinical Data: T11 fracture  LUMBAR SPINE - 2-3 VIEW  Comparison: CT 11/16/2011  Findings: A series of four intraoperative fluoroscopic spot images document posterior fusion with bilateral pedicle screws at T9, T10, L1, and L2  with vertical interconnecting rods, intact.  T11 fracture is poorly visualized.  IMPRESSION:  1.  Posterior spinal fusion T9-L2.  Original Report Authenticated By: Osa Craver, M.D.   Dg Lumbar Spine 1 View  11/18/2011  *RADIOLOGY REPORT*  Clinical Data: T11 fracture  LUMBAR SPINE - 1 VIEW  Comparison: 11/16/2011  Findings: On this single lateral intraoperative radiograph, a linear metallic density projects across the T12-L1 interspace. Comminuted fracture of the T11 vertebral body is noted, partially obscured by overlying marker.  IMPRESSION:  1.  Intraoperative localization.  Original Report Authenticated By: Osa Craver, M.D.   Dg Chest Port 1 View  11/19/2011  *RADIOLOGY REPORT*  Clinical Data: Difficulty breathing, decreased sats  PORTABLE CHEST - 1 VIEW  Comparison: 11/16/2011  Findings: There is a probable right pleural effusion and new dense consolidation / atelectasis at the right lung base. There is some associated volume loss with mild rightward mediastinal shift.  Mild cardiomegaly stable.  Patchy interstitial opacities at the left lung base.  Interval placement of spinal fusion hardware across the thoracolumbar junction.  IMPRESSION:  1.  New right lower lung atelectasis/consolidation with volume loss and suspected pleural effusion.  Original Report Authenticated By: Osa Craver, M.D.   Dg C-arm Gt 120 Min  11/18/2011  CLINICAL DATA: T9-L1 fusion   C-ARM GT 120 MIN  Fluoroscopy was utilized by the requesting physician.  No radiographic  interpretation.      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 Does the need for close, 24 hr/day medical supervision in concert with the patient's rehab needs make it unreasonable for this patient to be served in a less intensive setting? Yes 2. Co-Morbidities requiring supervision/potential complications: Neurogenic bowel and bladder, diabetes 3. Due to bladder management, bowel management, safety, skin/wound care, disease management, medication administration, pain management and patient education, does the patient require 24 hr/day rehab nursing? Yes 4. Does the patient require coordinated care of a physician, rehab nurse, PT (1-2 hrs/day, 5 days/week) and OT (1-2 hrs/day, 5 days/week) to address physical and functional deficits in the context of the above medical diagnosis(es)? Yes Addressing deficits in the following areas: balance, endurance, locomotion, strength, transferring, bowel/bladder control, bathing, dressing, feeding, grooming, toileting and psychosocial support 5. Can the patient  actively participate in an intensive therapy program of at least 3 hrs of therapy per day at least 5 days per week? Yes 6. The potential for patient to make measurable gains while on inpatient rehab is good 7. Anticipated functional outcomes upon discharge from inpatients are wheelchair modified independent to minimal assistance PT, modified independent to minimal assistance OT, modified independent to minimal assistance with nursing needs 8. Estimated rehab length of stay to reach the above functional goals is: 2-3 weeks 9. Does the patient have adequate social supports to accommodate these discharge functional goals? Yes 10. Anticipated D/C setting: Home 11. Anticipated post D/C treatments: HH therapy 12. Overall Rehab/Functional Prognosis: good  RECOMMENDATIONS: This patient's condition is appropriate for continued rehabilitative care in the following setting: CIR Patient has agreed to participate in recommended program. Yes Note that insurance prior authorization may be required for reimbursement for recommended care.  Comment: Rehabilitation nurse to follow along   Ivory Broad, MD 11/19/2011

## 2011-11-19 NOTE — Progress Notes (Signed)
Patient ID: John Meza, male   DOB: 09-May-1961, 51 y.o.   MRN: [id number] Subjective:  Patient is alert and pleasant. He is in no apparent distress. And looks well.  Objective: Vital signs in last 24 hours: Temp:  [97.9 F (36.6 C)-102.5 F (39.2 C)] 99.9 F (37.7 C) (02/07 1200) Pulse Rate:  [91-150] 120  (02/07 1400) Resp:  [8-25] 23  (02/07 1400) BP: (99-158)/(64-90) 125/81 mmHg (02/07 1400) SpO2:  [89 %-100 %] 89 % (02/07 1400) FiO2 (%):  [50 %-60 %] 60 % (02/07 0300)  Intake/Output from previous day: 02/06 0701 - 02/07 0700 In: 4370 [P.O.:20; I.V.:4300; IV Piggyback:50] Out: 3225 [Urine:2875; Blood:350] Intake/Output this shift: Total I/O In: 570 [P.O.:120; I.V.:450] Out: 355 [Urine:355]  Physical exam patient is alert and oriented. He remains paraplegic.  Lab Results:  Basename 11/19/11 0525 11/19/11 0112 11/17/11 0450  WBC 5.6 -- 7.9  HGB 11.3* 11.3* --  HCT 33.2* 33.0* --  PLT 112* -- 134*   BMET  Basename 11/19/11 0525 11/17/11 0450  NA 135 134*  K 4.2 3.5  CL 100 99  CO2 25 28  GLUCOSE 234* 142*  BUN 12 15  CREATININE 0.70 0.60  CALCIUM 8.3* 9.0    Studies/Results: Dg Lumbar Spine 2-3 Views  11/18/2011  *RADIOLOGY REPORT*  Clinical Data: T11 fracture  LUMBAR SPINE - 2-3 VIEW  Comparison: CT 11/16/2011  Findings: A series of four intraoperative fluoroscopic spot images document posterior fusion with bilateral pedicle screws at T9, T10, L1, and L2 with vertical interconnecting rods, intact.  T11 fracture is poorly visualized.  IMPRESSION:  1.  Posterior spinal fusion T9-L2.  Original Report Authenticated By: Osa Craver, M.D.   Dg Lumbar Spine 1 View  11/18/2011  *RADIOLOGY REPORT*  Clinical Data: T11 fracture  LUMBAR SPINE - 1 VIEW  Comparison: 11/16/2011  Findings: On this single lateral intraoperative radiograph, a linear metallic density projects across the T12-L1 interspace. Comminuted fracture of the T11 vertebral body is noted, partially  obscured by overlying marker.  IMPRESSION:  1.  Intraoperative localization.  Original Report Authenticated By: Osa Craver, M.D.   Dg Chest Port 1 View  11/19/2011  *RADIOLOGY REPORT*  Clinical Data: Difficulty breathing, decreased sats  PORTABLE CHEST - 1 VIEW  Comparison: 11/16/2011  Findings: There is a probable right pleural effusion and new dense consolidation / atelectasis at the right lung base. There is some associated volume loss with mild rightward mediastinal shift.  Mild cardiomegaly stable.  Patchy interstitial opacities at the left lung base.  Interval placement of spinal fusion hardware across the thoracolumbar junction.  IMPRESSION:  1.  New right lower lung atelectasis/consolidation with volume loss and suspected pleural effusion.  Original Report Authenticated By: Osa Craver, M.D.   Dg C-arm Gt 120 Min  11/18/2011  CLINICAL DATA: T9-L1 fusion   C-ARM GT 120 MIN  Fluoroscopy was utilized by the requesting physician.  No radiographic  interpretation.      Assessment/Plan: Postop day #1: The patient is doing well. Will begin to mobilize him with PT and OT. He needs to wear his TLSO when his head of bed is greater than 45 or is out of bed.  LOS: 3 days     Khristie Sak D 11/19/2011, 2:41 PM

## 2011-11-19 NOTE — Progress Notes (Signed)
Late entry: Pt transferred to 3100 with tachycardia as observed in PACU. Pt was on 3L [place] advanced to venturi and then to NRB.  Trauma MD paged about tachycardia and decreased sats. Trauma MD suggested to call anesthesiology to reintubate the patient. Anesthesia came and drew ABG on the patient and stated that at this point it does not warrant reintubation. Patient HR still remains in the mid to high 130s and remains on NRB. Will page Trauma MD about HR.   Darrick Penna Alaine 12:42 AM 11/19/2011

## 2011-11-20 ENCOUNTER — Inpatient Hospital Stay (HOSPITAL_COMMUNITY): Payer: Worker's Compensation

## 2011-11-20 ENCOUNTER — Inpatient Hospital Stay (HOSPITAL_COMMUNITY)
Admission: RE | Admit: 2011-11-20 | Discharge: 2011-12-11 | DRG: 945 | Disposition: A | Discharge status: Home Health | Payer: Worker's Compensation | Source: Ambulatory Visit | Attending: Physical Medicine & Rehabilitation | Admitting: Physical Medicine & Rehabilitation

## 2011-11-20 DIAGNOSIS — W11XXXA Fall on and from ladder, initial encounter: Secondary | ICD-10-CM

## 2011-11-20 DIAGNOSIS — K592 Neurogenic bowel, not elsewhere classified: Secondary | ICD-10-CM

## 2011-11-20 DIAGNOSIS — Z5189 Encounter for other specified aftercare: Principal | ICD-10-CM

## 2011-11-20 DIAGNOSIS — Z79899 Other long term (current) drug therapy: Secondary | ICD-10-CM

## 2011-11-20 DIAGNOSIS — IMO0002 Reserved for concepts with insufficient information to code with codable children: Secondary | ICD-10-CM

## 2011-11-20 DIAGNOSIS — G8220 Paraplegia, unspecified: Secondary | ICD-10-CM

## 2011-11-20 DIAGNOSIS — E785 Hyperlipidemia, unspecified: Secondary | ICD-10-CM

## 2011-11-20 DIAGNOSIS — N319 Neuromuscular dysfunction of bladder, unspecified: Secondary | ICD-10-CM

## 2011-11-20 DIAGNOSIS — E669 Obesity, unspecified: Secondary | ICD-10-CM

## 2011-11-20 DIAGNOSIS — Y9269 Other specified industrial and construction area as the place of occurrence of the external cause: Secondary | ICD-10-CM

## 2011-11-20 DIAGNOSIS — Z794 Long term (current) use of insulin: Secondary | ICD-10-CM

## 2011-11-20 DIAGNOSIS — E119 Type 2 diabetes mellitus without complications: Secondary | ICD-10-CM

## 2011-11-20 LAB — OPIATE, QUANTITATIVE, URINE
Codeine Urine: NEGATIVE NG/ML
Hydrocodone: NEGATIVE NG/ML
Hydromorphone GC/MS Conf: NEGATIVE NG/ML
Morphine, Confirm: 1300 NG/ML — ABNORMAL HIGH

## 2011-11-20 MED ORDER — PANTOPRAZOLE SODIUM 40 MG PO TBEC
40.0000 mg | DELAYED_RELEASE_TABLET | Freq: Every day | ORAL | Status: DC
  Administered 2011-11-21 – 2011-12-10 (×20): 40 mg via ORAL
  Filled 2011-11-20 (×19): qty 1

## 2011-11-20 MED ORDER — ACETAMINOPHEN 325 MG PO TABS
325.0000 mg | ORAL_TABLET | ORAL | Status: DC | PRN
  Administered 2011-11-23 – 2011-12-04 (×6): 650 mg via ORAL
  Filled 2011-11-20 (×7): qty 2

## 2011-11-20 MED ORDER — BISACODYL 5 MG PO TBEC
5.0000 mg | DELAYED_RELEASE_TABLET | Freq: Every day | ORAL | Status: DC | PRN
  Administered 2011-11-22: 5 mg via ORAL
  Filled 2011-11-20 (×2): qty 1

## 2011-11-20 MED ORDER — INSULIN ASPART 100 UNIT/ML ~~LOC~~ SOLN
0.0000 [IU] | Freq: Three times a day (TID) | SUBCUTANEOUS | Status: DC
  Administered 2011-11-20 – 2011-11-22 (×6): 7 [IU] via SUBCUTANEOUS
  Administered 2011-11-22: 11 [IU] via SUBCUTANEOUS
  Administered 2011-11-23: 7 [IU] via SUBCUTANEOUS
  Administered 2011-11-23 – 2011-11-24 (×3): 11 [IU] via SUBCUTANEOUS
  Administered 2011-11-24 – 2011-11-25 (×3): 7 [IU] via SUBCUTANEOUS
  Administered 2011-11-25: 4 [IU] via SUBCUTANEOUS
  Administered 2011-11-25 – 2011-11-26 (×3): 11 [IU] via SUBCUTANEOUS
  Administered 2011-11-26: 4 [IU] via SUBCUTANEOUS
  Administered 2011-11-27: 11 [IU] via SUBCUTANEOUS
  Administered 2011-11-27: 4 [IU] via SUBCUTANEOUS
  Administered 2011-11-27 – 2011-11-28 (×2): 3 [IU] via SUBCUTANEOUS
  Administered 2011-11-28: 5 [IU] via SUBCUTANEOUS
  Administered 2011-11-28 – 2011-11-30 (×4): 4 [IU] via SUBCUTANEOUS
  Administered 2011-11-30: 7 [IU] via SUBCUTANEOUS
  Administered 2011-11-30: 4 [IU] via SUBCUTANEOUS
  Administered 2011-12-01 – 2011-12-03 (×4): 3 [IU] via SUBCUTANEOUS
  Administered 2011-12-04: 4 [IU] via SUBCUTANEOUS
  Administered 2011-12-05: 3 [IU] via SUBCUTANEOUS
  Administered 2011-12-06: 4 [IU] via SUBCUTANEOUS
  Administered 2011-12-08 – 2011-12-10 (×3): 3 [IU] via SUBCUTANEOUS
  Filled 2011-11-20 (×2): qty 3

## 2011-11-20 MED ORDER — BACITRACIN ZINC 500 UNIT/GM EX OINT
TOPICAL_OINTMENT | Freq: Two times a day (BID) | CUTANEOUS | Status: DC
  Administered 2011-11-20 – 2011-12-11 (×32): via TOPICAL
  Filled 2011-11-20 (×2): qty 15

## 2011-11-20 MED ORDER — ROSUVASTATIN CALCIUM 20 MG PO TABS
20.0000 mg | ORAL_TABLET | Freq: Every day | ORAL | Status: DC
  Administered 2011-11-20 – 2011-12-10 (×21): 20 mg via ORAL
  Filled 2011-11-20 (×23): qty 1

## 2011-11-20 MED ORDER — SORBITOL 70 % SOLN
30.0000 mL | Freq: Every day | Status: DC | PRN
  Administered 2011-11-21: 30 mL via ORAL
  Filled 2011-11-20 (×2): qty 30

## 2011-11-20 MED ORDER — PROMETHAZINE HCL 25 MG/ML IJ SOLN: 12.5000 mg | Freq: Four times a day (QID) | INTRAMUSCULAR | Status: DC | PRN

## 2011-11-20 MED ORDER — BISACODYL 10 MG RE SUPP
10.0000 mg | Freq: Every day | RECTAL | Status: DC
  Administered 2011-11-21 – 2011-12-11 (×20): 10 mg via RECTAL
  Filled 2011-11-20 (×20): qty 1

## 2011-11-20 MED ORDER — INSULIN GLARGINE 100 UNIT/ML ~~LOC~~ SOLN
10.0000 [IU] | Freq: Every day | SUBCUTANEOUS | Status: DC
  Administered 2011-11-20 – 2011-11-21 (×2): 10 [IU] via SUBCUTANEOUS
  Filled 2011-11-20 (×2): qty 3

## 2011-11-20 MED ORDER — DIAZEPAM 5 MG PO TABS: 5.0000 mg | ORAL_TABLET | Freq: Four times a day (QID) | ORAL | Status: DC | PRN

## 2011-11-20 MED ORDER — SENNA 8.6 MG PO TABS
1.0000 | ORAL_TABLET | Freq: Two times a day (BID) | ORAL | Status: DC
  Administered 2011-11-20 – 2011-11-21 (×3): 8.6 mg via ORAL
  Filled 2011-11-20 (×6): qty 1

## 2011-11-20 MED ORDER — FLEET ENEMA 7-19 GM/118ML RE ENEM
1.0000 | ENEMA | Freq: Once | RECTAL | Status: DC
  Filled 2011-11-20: qty 1

## 2011-11-20 MED ORDER — POLYETHYLENE GLYCOL 3350 17 G PO PACK
17.0000 g | PACK | Freq: Every day | ORAL | Status: DC | PRN
  Administered 2011-11-21 – 2011-11-23 (×2): 17 g via ORAL
  Filled 2011-11-20: qty 1

## 2011-11-20 MED ORDER — OXYCODONE HCL 5 MG PO TABS
10.0000 mg | ORAL_TABLET | ORAL | Status: DC | PRN
Start: 2011-11-20 — End: 2011-12-11
  Administered 2011-11-20 – 2011-11-21 (×4): 10 mg via ORAL
  Administered 2011-11-21: 20 mg via ORAL
  Administered 2011-11-21: 10 mg via ORAL
  Administered 2011-11-22: 20 mg via ORAL
  Administered 2011-11-22: 10 mg via ORAL
  Administered 2011-11-23 – 2011-11-24 (×9): 20 mg via ORAL
  Administered 2011-11-25: 10 mg via ORAL
  Administered 2011-11-25 – 2011-11-26 (×4): 20 mg via ORAL
  Administered 2011-11-26: 10 mg via ORAL
  Administered 2011-11-27: 20 mg via ORAL
  Administered 2011-11-27 (×2): 10 mg via ORAL
  Administered 2011-11-28: 20 mg via ORAL
  Administered 2011-11-28 (×2): 10 mg via ORAL
  Administered 2011-11-29: 20 mg via ORAL
  Administered 2011-11-29: 10 mg via ORAL
  Administered 2011-11-30 – 2011-12-01 (×2): 20 mg via ORAL
  Administered 2011-12-01 – 2011-12-02 (×2): 10 mg via ORAL
  Administered 2011-12-02 (×2): 15 mg via ORAL
  Administered 2011-12-03 – 2011-12-04 (×4): 20 mg via ORAL
  Administered 2011-12-04: 15 mg via ORAL
  Administered 2011-12-04: 10 mg via ORAL
  Administered 2011-12-04: 20 mg via ORAL
  Administered 2011-12-04: 10 mg via ORAL
  Administered 2011-12-05 – 2011-12-07 (×5): 20 mg via ORAL
  Administered 2011-12-07: 10 mg via ORAL
  Administered 2011-12-08: 20 mg via ORAL
  Administered 2011-12-08 – 2011-12-09 (×2): 10 mg via ORAL
  Administered 2011-12-09: 20 mg via ORAL
  Administered 2011-12-10: 10 mg via ORAL
  Administered 2011-12-10: 20 mg via ORAL
  Filled 2011-11-20: qty 4
  Filled 2011-11-20 (×2): qty 2
  Filled 2011-11-20 (×4): qty 4
  Filled 2011-11-20: qty 3
  Filled 2011-11-20: qty 4
  Filled 2011-11-20 (×3): qty 2
  Filled 2011-11-20 (×3): qty 4
  Filled 2011-11-20: qty 2
  Filled 2011-11-20 (×3): qty 4
  Filled 2011-11-20 (×2): qty 2
  Filled 2011-11-20 (×2): qty 4
  Filled 2011-11-20: qty 2
  Filled 2011-11-20 (×2): qty 4
  Filled 2011-11-20 (×2): qty 2
  Filled 2011-11-20 (×2): qty 4
  Filled 2011-11-20: qty 2
  Filled 2011-11-20 (×3): qty 4
  Filled 2011-11-20: qty 2
  Filled 2011-11-20: qty 3
  Filled 2011-11-20 (×2): qty 2
  Filled 2011-11-20 (×4): qty 4
  Filled 2011-11-20 (×2): qty 2
  Filled 2011-11-20 (×2): qty 4
  Filled 2011-11-20 (×2): qty 2
  Filled 2011-11-20 (×4): qty 4
  Filled 2011-11-20: qty 2
  Filled 2011-11-20 (×4): qty 4
  Filled 2011-11-20 (×2): qty 2

## 2011-11-20 MED ORDER — PROMETHAZINE HCL 12.5 MG RE SUPP: 12.5000 mg | Freq: Four times a day (QID) | RECTAL | Status: DC | PRN

## 2011-11-20 MED ORDER — NIACIN ER (ANTIHYPERLIPIDEMIC) 500 MG PO TBCR
1000.0000 mg | EXTENDED_RELEASE_TABLET | Freq: Every day | ORAL | Status: DC
  Administered 2011-11-20 – 2011-12-10 (×21): 1000 mg via ORAL
  Filled 2011-11-20 (×22): qty 2

## 2011-11-20 MED ORDER — PROMETHAZINE HCL 12.5 MG PO TABS
12.5000 mg | ORAL_TABLET | Freq: Four times a day (QID) | ORAL | Status: DC | PRN
  Administered 2011-12-03 – 2011-12-10 (×3): 12.5 mg via ORAL
  Filled 2011-11-20 (×3): qty 1

## 2011-11-20 MED ORDER — PREGABALIN 50 MG PO CAPS
75.0000 mg | ORAL_CAPSULE | Freq: Two times a day (BID) | ORAL | Status: DC
  Administered 2011-11-20 – 2011-11-25 (×10): 75 mg via ORAL
  Filled 2011-11-20 (×10): qty 1

## 2011-11-20 MED FILL — Sodium Chloride IV Soln 0.9%: INTRAVENOUS | Qty: 2000 | Status: AC

## 2011-11-20 MED FILL — Heparin Sodium (Porcine) Inj 1000 Unit/ML: INTRAMUSCULAR | Qty: 30 | Status: AC

## 2011-11-20 NOTE — Progress Notes (Signed)
Clinical Social Worker continuing to follow patient for emotional support and discharge planning needs.  Patient and patient family at bedside.  Patient plans to admit to inpatient rehab today.  Patient family has several questions regarding inpatient rehab.  CSW, inpatient rehab admissions coordinator, and interpreter met at bedside to answer questions and ease patient and patient family fears.  Clinical Social Worker was able to take patient daughters up to 4000 to show them patient rehab room (4001) and introduce to rehab social worker Facilities manager).  Patient wife has been given contact information for interpreter in the event that daughters are not present to assist.    No SBIRT completed with patient due to pain while interpreter present.  Patient did not present with any drug or alcohol concerns and per family patient with no history of concerns.  Clinical Social Worker will sign off for now as social work intervention is no longer needed. Please consult [place] again if new need arises.  [address] [place], [place] [phone]

## 2011-11-20 NOTE — Plan of Care (Signed)
Overall Plan of Care [facility]) Patient Details Name: John Meza MRN: [id number] DOB: 01/12/1961  Diagnosis:   T11 SCI  Primary Diagnosis:    SCI (spinal cord injury) Co-morbidities: neurogenic bowel and bladder  Functional Problem List  Patient demonstrates impairments in the following areas: Balance, Bladder, Bowel, Edema, Endurance, Medication Management, Motor, Nutrition, Pain and Sensory   Basic ADL's: grooming, bathing, dressing, toileting and toilet transfer Advanced ADL's: N/A at this time  Transfers:  bed mobility, bed to chair, car and furniture Locomotion:  ambulation, wheelchair mobility and stairs  Additional Impairments:  None  Anticipated Outcomes Item Anticipated Outcome  Eating/Swallowing    Basic self-care  Overall min assist  Tolieting  supervision  Bowel/Bladder  Continent with bowel and bladder program established  Transfers  Min assist with drop arm vs. paded tub bench with  cut out, min A bed mobility and min/mod A transfers  Locomotion  Mod I w/c mobility  Communication    Cognition    Pain  Managed 2/10  Safety/Judgment    Other     Therapy Plan: PT Frequency: 1-2 X/day, 60-90 minutes OT Frequency: 1-2 X/day, 60-90 minutes     Team Interventions: Item RN PT OT SLP SW TR Other  Self Care/Advanced ADL Retraining   x      Neuromuscular Re-Education  x       Therapeutic Activities  x x      UE/LE Strength Training/ROM  x x      UE/LE Coordination Activities  x       Visual/Perceptual Remediation/Compensation         DME/Adaptive Equipment Instruction  x x      Therapeutic Exercise  x x      Balance/Vestibular Training  x x      Patient/Family Education x x x      Cognitive Remediation/Compensation         xFunctional Mobility Training  x x      Ambulation/Gait Training  x       Museum/gallery curator  x       Wheelchair Propulsion/Positioning  x       Functional Tourist information centre manager Reintegration  x         Dysphagia/Aspiration Film/video editor         Bladder Management x        Bowel Management x        Disease Management/Prevention x x       Pain Management x x x      Medication Management x        Skin Care/Wound Management x x x      Splinting/Orthotics  x       Discharge Planning  x   x    Psychosocial Support x    x                       Team Discharge Planning: Destination:  Home Projected Follow-up:  PT, OT and Home Health Projected Equipment Needs:  Sliding Board, Wheelchair and drop arm commode or padded tub bench with cut out Patient/family involved in discharge planning:  Yes  MD ELOS: 3-4 weeks Medical Rehab Prognosis:  Good Assessment: pt fell from a ladder and suffered a T11 fx and complete SCI.  He has a supportive wife but will require aggressive inpatient rehab to return to  home.  Focus will be on transfers and wheelchair mobility as well as pain, bowel and bladder management, and skin care.  Goals will be supervision to minimal assistance with w/c mobility and supervision to moderate assist with ADL's.

## 2011-11-20 NOTE — H&P (Signed)
Physical Medicine and Rehabilitation Admission H&P  Tayten Bergdoll is an 51 y.o. male.  Chief Complaint   Patient presents with   .  Fall   :  HPI: Rockland Kotarski is an 51 y.o. Hispanic male with history of DM, obesity, who was at work on a ladder on 02./04, when he lost his balance and fell 10 feet with +LOC briefly. Patient with inability to move BLE with numbness and pain posterior head. X- rays in ED with Markedly comminuted T11 fracture with Retropulsion and anterior tanslation of spinal canal with near obliteration of central canal as well his left lumbar L1 and lumbar L2 transverse process fractures.. Cranial CT scan was negative for acute changes or skull fracture. Evaluated by Dr. Lovell Sheehan And patient underwent T9-L1 posterior lateral arthrodesis with bone graft on 02/06 pm. Post op with hypoxia requiring BIPAP. Latest followup chest x-ray was improved by basilar atelectasis. TLSO when out of bed must be applied in the supine position. Foley catheter tube remains in place. SCDs in place for deep vein thrombosis prophylaxis. Physical medicine and rehabilitation consulted for plan for inpatient rehabilitation services.  Review of Systems  HENT: Negative for hearing loss.  Eyes: Negative for blurred vision and double vision.  Respiratory: Negative for cough and shortness of breath.  Cardiovascular: Positive for chest pain (with deep breath and due to back pain). Negative for palpitations.  Gastrointestinal: Positive for constipation. Negative for heartburn, nausea and abdominal pain.  Genitourinary: Negative for urgency and frequency.  Musculoskeletal: Positive for back pain.  Neurological: Positive for sensory change, seizures and weakness. Negative for headaches.  Psychiatric/Behavioral: Negative for depression and memory loss. The patient is not nervous/anxious.  All other systems reviewed and are negative  Past Medical History   Diagnosis  Date   .  Diabetes mellitus    .  Hypertension       History reviewed. No pertinent past surgical history.  History reviewed. No pertinent family history.  Social History: reports that he has never smoked. He has never used smokeless tobacco. He reports that he drinks about .6 ounces of alcohol per week. He reports that he does not use illicit drugs.  Allergies: No Known Allergies  Medications Prior to Admission   Medication  Dose  Route  Frequency  Provider  Last Rate  Last Dose   .  acetaminophen (TYLENOL) tablet 650 mg  650 mg  Oral  Q4H PRN  Cristi Loron, MD   650 mg at 11/19/11 1610    Or   .  acetaminophen (TYLENOL) suppository 650 mg  650 mg  Rectal  Q4H PRN  Cristi Loron, MD   650 mg at 11/19/11 0452   .  bacitracin 69629 UNITS injection         .  bacitracin ointment   Topical  BID  Freeman Caldron, PA     .  bisacodyl (DULCOLAX) EC tablet 5 mg  5 mg  Oral  Daily PRN  Cherylynn Ridges III, MD     .  bisacodyl (DULCOLAX) suppository 10 mg  10 mg  Rectal  Daily  Cherylynn Ridges III, MD   10 mg at 11/19/11 0940   .  ceFAZolin (ANCEF) 2-3 GM-% IVPB SOLR         .  ceFAZolin (ANCEF) IVPB 2 g/50 mL premix  2 g  Intravenous  Q8H  Cristi Loron, MD   2 g at 11/19/11 0912   .  diazepam (  VALIUM) tablet 5 mg  5 mg  Oral  Q6H PRN  Cristi Loron, MD     .  docusate sodium (COLACE) capsule 100 mg  100 mg  Oral  BID  Cristi Loron, MD   100 mg at 11/20/11 1207   .  HYDROmorphone (DILAUDID) injection 1 mg  1 mg  Intravenous  Once  Freeman Caldron, PA   1 mg at 11/17/11 1635   .  influenza inactive virus vaccine (FLUZONE/FLUARIX) injection 0.5 mL  0.5 mL  Intramuscular  Tomorrow-1000  Cherylynn Ridges III, MD     .  insulin aspart (novoLOG) injection 0-20 Units  0-20 Units  Subcutaneous  TID WC  Freeman Caldron, PA   4 Units at 11/20/11 [phone]   .  insulin glargine (LANTUS) injection 10 Units  10 Units  Subcutaneous  QHS  Cherylynn Ridges III, MD   10 Units at 11/19/11 2252   .  iohexol (OMNIPAQUE) 300 MG/ML solution 100 mL  100 mL   Intravenous  Once PRN  Medication Radiologist, MD   100 mL at 11/16/11 1419   .  labetalol (NORMODYNE,TRANDATE) 5 MG/ML injection         .  labetalol (NORMODYNE,TRANDATE) injection 5 mg  5 mg  Intravenous  Q2H PRN  Thomas A. Cornett, MD   5 mg at 11/19/11 0303   .  menthol-cetylpyridinium (CEPACOL) lozenge 3 mg  1 lozenge  Oral  PRN  Cristi Loron, MD      Or   .  phenol [facility]) mouth spray 1 spray  1 spray  Mouth/Throat  PRN  Cristi Loron, MD     .  morphine 4 MG/ML injection 4 mg  4 mg  Intravenous  Once  Nelda Bucks, MD   4 mg at 11/18/11 1359   .  morphine 4 MG/ML injection 6 mg  6 mg  Intravenous  Once  Lyanne Co, MD   6 mg at 11/16/11 0959   .  morphine 4 MG/ML injection 6 mg  6 mg  Intravenous  Once  Lyanne Co, MD   6 mg at 11/16/11 1135   .  niacin (NIASPAN) CR tablet 1,000 mg  1,000 mg  Oral  QHS  Cristi Loron, MD   1,000 mg at 11/19/11 2252   .  ondansetron (ZOFRAN) 4 MG/2ML injection       4 mg at 11/16/11 1019   .  ondansetron (ZOFRAN) injection 4 mg  4 mg  Intravenous  Q4H PRN  Cristi Loron, MD     .  ondansetron [facility]) tablet 4 mg  4 mg  Oral  Q6H PRN  Shawn Rayburn, PA     .  oxyCODONE (Oxy IR/ROXICODONE) immediate release tablet 10-20 mg  10-20 mg  Oral  Q4H PRN  Freeman Caldron, PA   20 mg at 11/20/11 0747   .  oxyCODONE-acetaminophen (PERCOCET) 5-325 MG per tablet 1-2 tablet  1-2 tablet  Oral  Q4H PRN  Cristi Loron, MD     .  pantoprazole (PROTONIX) EC tablet 40 mg  40 mg  Oral  Q1200  Shawn Rayburn, PA   40 mg at 11/20/11 1207   .  pantoprazole (PROTONIX) injection 40 mg  40 mg  Intravenous  QHS  Cristi Loron, MD   40 mg at 11/19/11 2251   .  pneumococcal 23 valent vaccine (PNU-IMMUNE) injection 0.5 mL  0.5  mL  Intramuscular  Tomorrow-1000  Cherylynn Ridges III, MD     .  pregabalin (LYRICA) capsule 75 mg  75 mg  Oral  BID  Freeman Caldron, PA   75 mg at 11/20/11 1207   .  rosuvastatin (CRESTOR) tablet 20 mg  20 mg  Oral   q1800  Cristi Loron, MD   20 mg at 11/19/11 1612   .  sodium chloride 0.9 % bolus 1,000 mL  1,000 mL  Intravenous  Once  Thomas A. Cornett, MD   1,000 mL at 11/19/11 0103   .  sodium chloride 0.9 % infusion         .  sodium phosphate (FLEET) 7-19 GM/118ML enema 1 enema  1 enema  Rectal  Once  Cherylynn Ridges III, MD     .  [facility]) injection 0.5 mL  0.5 mL  Intramuscular  Once  Lyanne Co, MD   0.5 mL at 11/16/11 1051   .  white petrolatum (VASELINE) gel         .  zolpidem (AMBIEN) tablet 10 mg  10 mg  Oral  QHS PRN  Cristi Loron, MD     .  DISCONTD: 0.45 % NaCl with KCl 20 mEq / L infusion   Intravenous  Continuous  Cherylynn Ridges III, MD  50 mL/hr at 11/20/11 0800    .  DISCONTD: 0.9 % irrigation (POUR BTL)    PRN  Cristi Loron, MD   1,000 mL at 11/18/11 1820   .  DISCONTD: bacitracin 50,000 Units in sodium chloride irrigation 0.9 % 500 mL irrigation    PRN  Cristi Loron, MD     .  DISCONTD: Bupivacaine-Epinephrine PF (MARCAINE W/ EPI (PF)) 0.5-1:200000 % injection    PRN  Cristi Loron, MD   20 mL at 11/18/11 1820   .  DISCONTD: ceFAZolin (ANCEF) IVPB 1 g/50 mL premix  1 g  Intravenous  60 min Pre-Op  Cristi Loron, MD     .  DISCONTD: ceFAZolin (ANCEF) IVPB 2 g/50 mL premix  2 g  Intravenous  60 min Pre-Op  Rulon Abide, DO     .  DISCONTD: diphenhydrAMINE (BENADRYL) 12.5 MG/5ML elixir 12.5 mg  12.5 mg  Oral  Q6H PRN  Cristi Loron, MD     .  DISCONTD: diphenhydrAMINE (BENADRYL) injection 12.5 mg  12.5 mg  Intravenous  Q6H PRN  Cristi Loron, MD     .  DISCONTD: HYDROcodone-acetaminophen (NORCO) 5-325 MG per tablet 1-2 tablet  1-2 tablet  Oral  Q4H PRN  Cristi Loron, MD   1 tablet at 11/20/11 [phone]   .  DISCONTD: HYDROmorphone (DILAUDID) injection 0.25-0.5 mg  0.25-0.5 mg  Intravenous  Q5 min PRN  E. Jairo Ben, MD     .  DISCONTD: insulin aspart (novoLOG) injection 0-15 Units  0-15 Units  Subcutaneous  Q4H  Shawn Rayburn, PA   3  Units at 11/17/11 0416   .  DISCONTD: insulin glargine (LANTUS) injection 5 Units  5 Units  Subcutaneous  QHS  Shawn Rayburn, PA   5 Units at 11/17/11 2128   .  DISCONTD: lactated ringers infusion   Intravenous  Continuous  Cristi Loron, MD  75 mL/hr at 11/19/11 0700    .  DISCONTD: morphine 1 MG/ML PCA injection   Intravenous  Q4H  Cristi Loron, MD   13.5 mg  at 11/19/11 1200   .  DISCONTD: morphine 2 MG/ML injection 2 mg  2 mg  Intravenous  Q1H PRN  Shawn Rayburn, PA     .  DISCONTD: morphine 2 MG/ML injection 2 mg  2 mg  Intravenous  Q4H PRN  Freeman Caldron, PA   2 mg at 11/17/11 1508   .  DISCONTD: morphine 4 MG/ML injection 3 mg  3 mg  Intravenous  Q1H PRN  Shawn Rayburn, PA   3 mg at 11/17/11 0426   .  DISCONTD: morphine 4 MG/ML injection 4 mg  4 mg  Intravenous  Q1H PRN  Shawn Rayburn, PA   4 mg at 11/17/11 0753   .  DISCONTD: morphine 4 MG/ML injection 4 mg  4 mg  Intravenous  Q4H PRN  Freeman Caldron, PA   4 mg at 11/18/11 1122   .  DISCONTD: morphine 4 MG/ML injection 4 mg  4 mg  Intravenous  Q2H PRN  Freeman Caldron, PA   4 mg at 11/18/11 1539   .  DISCONTD: naloxone (NARCAN) injection 0.4 mg  0.4 mg  Intravenous  PRN  Cristi Loron, MD     .  DISCONTD: ondansetron [facility]) injection 4 mg  4 mg  Intravenous  Q6H PRN  Shawn Rayburn, PA     .  DISCONTD: ondansetron (ZOFRAN) injection 4 mg  4 mg  Intravenous  Q6H PRN  Cristi Loron, MD     .  DISCONTD: ondansetron (ZOFRAN) tablet 4 mg  4 mg  Oral  Q6H PRN  Shawn Rayburn, PA     .  DISCONTD: oxyCODONE (Oxy IR/ROXICODONE) immediate release tablet 5-15 mg  5-15 mg  Oral  Q4H PRN  Freeman Caldron, PA   15 mg at 11/17/11 1438   .  DISCONTD: pantoprazole (PROTONIX) injection 40 mg  40 mg  Intravenous  Q1200  Shawn Rayburn, PA   40 mg at 11/16/11 1736   .  DISCONTD: promethazine (PHENERGAN) injection 6.25-12.5 mg  6.25-12.5 mg  Intravenous  Q15 min PRN  E. Jairo Ben, MD     .  DISCONTD: sodium chloride 0.9 %  injection 9 mL  9 mL  Intravenous  PRN  Cristi Loron, MD     .  DISCONTD: Surgifoam 100 with Thrombin 20,000 units (20 ml) topical solution    PRN  Cristi Loron, MD     .  DISCONTD: TDaP (ADACEL) injection 0.5 mL  0.5 mL  Intramuscular  Once  Lyanne Co, MD      No current outpatient prescriptions on file as of 11/20/2011.    Home:  Home Living  Lives With: Spouse  Receives Help From: Family  Type of Home: House  Home Layout: One level  Home Access: Stairs to enter  Entrance Stairs-Rails: None  Secretary/administrator of Steps: 3  Bathroom Shower/Tub: Wellsite geologist: Yes  How Accessible: Accessible via wheelchair  Home Adaptive Equipment: Crutches  Functional History:  Prior Function  Level of Independence: Independent with basic ADLs;Independent with gait;Independent with transfers  Able to Take Stairs?: Reciprically  Driving: Yes  Vocation: Full time employment  Functional Status:  Mobility:  Bed Mobility  Bed Mobility: Yes  Rolling Right: 3: Mod assist  Rolling Right Details (indicate cue type and reason): VC for hand placement. Pt able to assist with trunk and UEs, physical assist needed for LEs  Rolling Left: 3:  Mod assist  Rolling Left Details (indicate cue type and reason): VC for hand placement and sequencing. Pt able to assist with trunk and UEs, physical assist for LE placement  Supine to Sit: 1: +2 Total assist;Patient percentage (comment) (20)  Supine to Sit Details (indicate cue type and reason): Pt able to assist more with UEs although still required max assist of trunk and LEs. VC throughout for hand placement and sequencing  Scooting to HOB: 1: +2 Total assist  Scooting to [facility] Details (indicate cue type and reason): Drawsheet assist x 2 to bring pt to [facility] and to allign legs  Transfers  Transfers: Yes  Lateral/Scoot Transfers: 1: +2 Total assist;Patient percentage (comment) (15)    Lateral/Scoot Transfer Details (indicate cue type and reason): VC for hand placement and sequencing. Pt assisted with UEs, R>L to scoot to the right. Drawsheet used as well as trunk support posteriorly to assist with scooting  Ambulation/Gait  Ambulation/Gait: No   ADL:  ADL  Eating/Feeding: Simulated;Set up  Eating/Feeding Details (indicate cue type and reason): wife present and assisting pt  Where Assessed - Eating/Feeding: Bed level  Grooming: Simulated;Wash/dry face;Set up  Where Assessed - Grooming: Supine, head of bed up  Toilet Transfer: +1 Total assistance;Simulated (due to incontinence with spinal cord level of injury)  Toilet Transfer Details (indicate cue type and reason): Pt currently with foley and could benefit from bowel and bladder program  Toilet Transfer Method: Not assessed  Ambulation Related to ADLs: none  ADL Comments: Pt provided interrupter on the phone to translate into spanish. pt does understand some english statements demonstrating by responding to question prior to translation. Pt with HOB gradually increased during session. HOB 25 degrees 118/75 HOB 40 112/74 HOB 50 117/76 Sitting Max (A) EOB 110/90 waiting ~2 mintues BP 129/79 Pt with SCD remaining on during entire session. Pt could benefit from ace wrapping bil LE and abdominal binder for next session. Pt tolerating activity well.  Cognition:  Cognition  Arousal/Alertness: Awake/alert  Orientation Level: Oriented to person (UTA other orientation)  Cognition  Arousal/Alertness: Awake/alert  Overall Cognitive Status: Appears within functional limits for tasks assessed  Orientation Level: Oriented to person (UTA other orientation)  Blood pressure 126/84, pulse 92, temperature 99.6 F (37.6 C), temperature source Oral, resp. rate 10, height 5\' 7"  (1.702 m), weight 81.8 kg (180 lb 5.4 oz), SpO2 98.00%.  Physical Exam  Nursing note and vitals reviewed.  Constitutional: He is oriented to person, place, and time.  He appears well-developed and well-nourished.  HENT:  Head: Normocephalic.  Eyes: Pupils are equal, round, and reactive to light. Extraocular eye movements intact Neck: Normal range of motion. Neck supple.  Cardiovascular: Normal rate.  Pulmonary/Chest:  Decreased breath sounds RLL. No dyspnea there is to be breathing quite comfortably today. Abdominal: Soft. Bowel sounds are normal. He exhibits no distension. There is no tenderness.  Musculoskeletal: He exhibits no edema.  Neurological: He is alert and oriented to person, place, and time. He displays abnormal reflex.  Paraparesis. Insensate below the T11/T12 level. He has 0 out of 5 strength in both lower extremities. Areflexic-BLE. Upper extremity motor and sensory exam are intact. Cognitively he is appropriate. There is a language barrier. No rectal tone. Upper extremity strength is 5 out of 5 with some pain in addition. Skin: Wound with mild same as discharge but otherwise intact.  Psychiatric: He has a normal mood and affect. His behavior is normal. Thought content normal  Results for orders placed during  the hospital encounter of 11/16/11 (from the past 48 hour(s))   TYPE AND SCREEN Status: Normal (Preliminary result)    Collection Time    11/18/11 3:37 PM   Component  Value  Range  Comment    ABO/RH(D)  O POS      Antibody Screen  NEG      Sample Expiration  11/21/2011      Unit Number  [id number]      Blood Component Type  RED CELLS,LR      Unit division  00      Status of Unit  ALLOCATED      Transfusion Status  OK TO TRANSFUSE      Crossmatch Result  Compatible      Unit Number  [id number]      Blood Component Type  RED CELLS,LR      Unit division  00      Status of Unit  ALLOCATED      Transfusion Status  OK TO TRANSFUSE      Crossmatch Result  Compatible     ABO/RH Status: Normal    Collection Time    11/18/11 3:37 PM   Component  Value  Range  Comment    ABO/RH(D)  O POS     GLUCOSE, CAPILLARY Status: Abnormal     Collection Time    11/18/11 6:44 PM   Component  Value  Range  Comment    Glucose-Capillary  155 (*)  70 - 99 (mg/dL)    GLUCOSE, CAPILLARY Status: Abnormal    Collection Time    11/18/11 10:00 PM   Component  Value  Range  Comment    Glucose-Capillary  178 (*)  70 - 99 (mg/dL)    BLOOD GAS, ARTERIAL Status: Abnormal    Collection Time    11/19/11 12:30 AM   Component  Value  Range  Comment    FIO2  100.00      Delivery systems  NON-REBREATHER OXYGEN MASK      pH, Arterial  7.311 (*)  7.350 - 7.450     pCO2 arterial  53.2 (*)  35.0 - 45.0 (mmHg)     pO2, Arterial  131.0 (*)  80.0 - 100.0 (mmHg)     Bicarbonate  26.1 (*)  20.0 - 24.0 (mEq/L)     TCO2  27.7  0 - 100 (mmol/L)     Acid-Base Excess  0.5  0.0 - 2.0 (mmol/L)     O2 Saturation  98.8      Patient temperature  98.6      Collection site  RIGHT RADIAL      Drawn by  COLLECTED BY RT      Sample type  ARTERIAL DRAW      Allens test (pass/fail)  PASS  PASS    HEMOGLOBIN AND HEMATOCRIT, BLOOD Status: Abnormal    Collection Time    11/19/11 1:12 AM   Component  Value  Range  Comment    Hemoglobin  11.3 (*)  13.0 - 17.0 (g/dL)     HCT  78.2 (*)  95.6 - 52.0 (%)    CBC Status: Abnormal    Collection Time    11/19/11 5:25 AM   Component  Value  Range  Comment    WBC  5.6  4.0 - 10.5 (K/uL)     RBC  3.72 (*)  4.22 - 5.81 (MIL/uL)     Hemoglobin  11.3 (*)  13.0 - 17.0 (g/dL)  HCT  33.2 (*)  39.0 - 52.0 (%)     MCV  89.2  78.0 - 100.0 (fL)     MCH  30.4  26.0 - 34.0 (pg)     MCHC  34.0  30.0 - 36.0 (g/dL)     RDW  96.0  45.4 - 15.5 (%)     Platelets  112 (*)  150 - 400 (K/uL)  PLATELET COUNT CONFIRMED BY SMEAR   BASIC METABOLIC PANEL Status: Abnormal    Collection Time    11/19/11 5:25 AM   Component  Value  Range  Comment    Sodium  135  135 - 145 (mEq/L)     Potassium  4.2  3.5 - 5.1 (mEq/L)     Chloride  100  96 - 112 (mEq/L)     CO2  25  19 - 32 (mEq/L)     Glucose, Bld  234 (*)  70 - 99 (mg/dL)     BUN  12  6 - 23 (mg/dL)      Creatinine, Ser  0.70  0.50 - 1.35 (mg/dL)     Calcium  8.3 (*)  8.4 - 10.5 (mg/dL)     GFR calc non Af Amer  >90  >90 (mL/min)     GFR calc Af Amer  >90  >90 (mL/min)    GLUCOSE, CAPILLARY Status: Abnormal    Collection Time    11/19/11 7:45 AM   Component  Value  Range  Comment    Glucose-Capillary  190 (*)  70 - 99 (mg/dL)    HEMOGLOBIN U9W Status: Abnormal    Collection Time    11/19/11 9:10 AM   Component  Value  Range  Comment    Hemoglobin A1C  10.2 (*)  <5.7 (%)     Mean Plasma Glucose  246 (*)  <117 (mg/dL)    GLUCOSE, CAPILLARY Status: Abnormal    Collection Time    11/19/11 12:08 PM   Component  Value  Range  Comment    Glucose-Capillary  244 (*)  70 - 99 (mg/dL)    GLUCOSE, CAPILLARY Status: Abnormal    Collection Time    11/19/11 4:05 PM   Component  Value  Range  Comment    Glucose-Capillary  230 (*)  70 - 99 (mg/dL)    GLUCOSE, CAPILLARY Status: Abnormal    Collection Time    11/19/11 10:50 PM   Component  Value  Range  Comment    Glucose-Capillary  224 (*)  70 - 99 (mg/dL)     Dg Lumbar Spine 2-3 Views  11/18/2011 *RADIOLOGY REPORT* Clinical Data: T11 fracture LUMBAR SPINE - 2-3 VIEW Comparison: CT 11/16/2011 Findings: A series of four intraoperative fluoroscopic spot images document posterior fusion with bilateral pedicle screws at T9, T10, L1, and L2 with vertical interconnecting rods, intact. T11 fracture is poorly visualized. IMPRESSION: 1. Posterior spinal fusion T9-L2. Original Report Authenticated By: Osa Craver, M.D.  Dg Lumbar Spine 1 View  11/18/2011 *RADIOLOGY REPORT* Clinical Data: T11 fracture LUMBAR SPINE - 1 VIEW Comparison: 11/16/2011 Findings: On this single lateral intraoperative radiograph, a linear metallic density projects across the T12-L1 interspace. Comminuted fracture of the T11 vertebral body is noted, partially obscured by overlying marker. IMPRESSION: 1. Intraoperative localization. Original Report Authenticated By: Osa Craver, M.D.  Dg Chest Port 1 View  11/20/2011 *RADIOLOGY REPORT* Clinical Data: Right lower lobe atelectasis PORTABLE CHEST - 1 VIEW Comparison: Yesterday Findings: Right basilar  atelectasis and pleural fluid has improved. Left basilar atelectasis has also minimally improved. Heart remains prominent. No pneumothorax. Fixation hardware in the thoracolumbar spine is stable. IMPRESSION: Improved bibasilar atelectasis. Original Report Authenticated By: Donavan Burnet, M.D.  Dg Chest Port 1 View  11/19/2011 *RADIOLOGY REPORT* Clinical Data: Difficulty breathing, decreased sats PORTABLE CHEST - 1 VIEW Comparison: 11/16/2011 Findings: There is a probable right pleural effusion and new dense consolidation / atelectasis at the right lung base. There is some associated volume loss with mild rightward mediastinal shift. Mild cardiomegaly stable. Patchy interstitial opacities at the left lung base. Interval placement of spinal fusion hardware across the thoracolumbar junction. IMPRESSION: 1. New right lower lung atelectasis/consolidation with volume loss and suspected pleural effusion. Original Report Authenticated By: Osa Craver, M.D.  Dg C-arm Gt 120 Min  11/18/2011 CLINICAL DATA: T9-L1 fusion C-ARM GT 120 MIN Fluoroscopy was utilized by the requesting physician. No radiographic interpretation.   Post Admission Physician Evaluation:  1. Functional deficits secondary to T11 spinal cord injury complete 2. Patient is admitted to receive collaborative, interdisciplinary care between the physiatrist, rehab nursing staff, and therapy team. 3. Patient's level of medical complexity and substantial therapy needs in context of that medical necessity cannot be provided at a lesser intensity of care such as a SNF. 4. Patient has experienced substantial functional loss from his/her baseline which was documented above under the "Functional History" and "Functional Status" headings. Judging by the patient's diagnosis,  physical exam, and functional history, the patient has potential for functional progress which will result in measurable gains while on inpatient rehab. These gains will be of substantial and practical use upon discharge in facilitating mobility and self-care at the household level. 5. Physiatrist will provide 24 hour management of medical needs as well as oversight of the therapy plan/treatment and provide guidance as appropriate regarding the interaction of the two. 6. 24 hour rehab nursing will assist with bladder management, bowel management, safety, skin/wound care, disease management, medication administration, pain management and patient education and help integrate therapy concepts, techniques,education, etc. 7. PT will assess and treat for: Functional mobility, range of motion, pain control, adaptive equipment, education. Goals are: Supervision to minimal assistance. 8. OT will assess and treat for: Upper extremity strength, adaptive techniques, ADLs, functional mobility. Goals are: Modified independent to moderate assistance. 9. SLP will assess and treat for: Not applicable 10. Case Management and Social Worker will assess and treat for psychological issues and discharge planning. 11. Team conference will be held weekly to assess progress toward goals and to determine barriers to discharge. 12. Patient will receive at least 3 hours of therapy per day at least 5 days per week. 13. ELOS and Prognosis: 3 weeks plus good Medical Problem List and Plan:  1. 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 DVT Prophylaxis/Anticoagulation: SCDs. Will discuss subcutaneous Lovenox. Monitor for any signs of deep vein thrombosis.  Needs Doppler study 3. Pain management. Lyrica 75 mg twice daily, oxycodone as needed. Monitor with increased activity. Patient complains generally of low back pain and denies dysesthesias in the lower extremities. 4. Neurogenic bowel and bladder. Foley catheter  tube currently in place. Initiate bowel program Provide patient and family education  5. Diabetes mellitus. Hemoglobin A1c of 10.2. Currently with Lantus insulin 10 units at bedtime. Patient on oral agents of Actos 45 mg daily prior to admission as well as Glucophage 1000 mg daily. Check blood sugars a.c. and at bedtime. Provide diabetic teaching.  Needs excellent sugar control to optimize wound healing 6. Hyperlipidemia. Crestor

## 2011-11-20 NOTE — PMR Pre-admission (Signed)
PMR Admission Coordinator Pre-Admission Assessment  Patient:  John Meza is an 51 y.o., male MRN:  [id number] DOB:  20-Apr-1961 Height:  5\' 7"  (170.2 cm) Weight:  81.8 kg (180 lb 5.4 oz)  Insurance Information: HMO:     PPO:      PCP:      IPA:      80/20:      OTHER: Date of injury 11/16/11 PRIMARY:Workers Comp      Policy#:[id number]      Subscriber:John Meza CM Name: John Meza       Phone#:[phone]     WUJ#:[phone] Pre-Cert#:[id number]    Employer:[employer] Benefits:  Phone #:[phone]     Name:  Eff. 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  Patient with inability to move BLE with numbness and pain.  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  Underwent T9-L1 posterior lateral arthrodesis with bone graft on 2/6 by John Meza.  Post op hypoxia requiring Bipap.  Has a history of DM and obesity.  Patients Past Medical History:   Past Medical History  Diagnosis Date  . Diabetes mellitus   . Hypertension    Family Medical History:  family history is not on file. Patients Current Diet: Carb Control  Prior Rehab/Hospitalizations: No previous inpatient rehab or hospitalization recently.  Current Medications: Current facility-administered medications:acetaminophen (TYLENOL) suppository 650 mg, 650 mg, Rectal, Q4H PRN, John Loron, MD, 650 mg at 11/19/11 1324;  acetaminophen (TYLENOL) tablet 650 mg, 650 mg, Oral, Q4H PRN, John Loron, MD, 650 mg at 11/19/11 1610;  bacitracin ointment, , Topical, BID, John Caldron, PA;  bisacodyl (DULCOLAX)  EC tablet 5 mg, 5 mg, Oral, Daily PRN, John Ridges III, MD bisacodyl (DULCOLAX) suppository 10 mg, 10 mg, Rectal, Daily, John Ridges III, MD, 10 mg at 11/19/11 0940;  diazepam (VALIUM) tablet 5 mg, 5 mg, Oral, Q6H PRN, John Loron, MD;  docusate sodium (COLACE) capsule 100 mg, 100 mg, Oral, BID, John Loron, MD, 100 mg at 11/20/11 1207;  insulin aspart (novoLOG) injection 0-20 Units, 0-20 Units, Subcutaneous, TID WC, John Caldron, PA, 7 Units at 11/20/11 1316 insulin glargine (LANTUS) injection 10 Units, 10 Units, Subcutaneous, QHS, John Ridges III, MD, 10 Units at 11/19/11 2252;  labetalol (NORMODYNE,TRANDATE) injection 5 mg, 5 mg, Intravenous, Q2H PRN, John Fus A. Cornett, MD, 5 mg at 11/19/11 0303;  menthol-cetylpyridinium (CEPACOL) lozenge 3 mg, 1 lozenge, Oral, PRN, John Loron, MD;  niacin (NIASPAN) CR tablet 1,000 mg, 1,000 mg, Oral, QHS, John Loron, MD, 1,000 mg at 11/19/11 2252 ondansetron [facility]) injection 4 mg, 4 mg, Intravenous, Q4H PRN, John Loron, MD;  ondansetron [facility]) tablet 4 mg, 4 mg, Oral, Q6H PRN, John Rayburn, PA;  oxyCODONE (Oxy IR/ROXICODONE) immediate release tablet 10-20 mg, 10-20 mg, Oral, Q4H PRN, John Caldron, PA, 20 mg  at 11/20/11 1350;  oxyCODONE-acetaminophen (PERCOCET) 5-325 MG per tablet 1-2 tablet, 1-2 tablet, Oral, Q4H PRN, John Loron, MD pantoprazole (PROTONIX) EC tablet 40 mg, 40 mg, Oral, Q1200, John Rayburn, PA, 40 mg at 11/20/11 1207;  pantoprazole (PROTONIX) injection 40 mg, 40 mg, Intravenous, QHS, John Loron, MD, 40 mg at 11/19/11 2251;  phenol (CHLORASEPTIC) mouth spray 1 spray, 1 spray, Mouth/Throat, PRN, John Loron, MD;  pregabalin (LYRICA) capsule 75 mg, 75 mg, Oral, BID, John Caldron, PA, 75 mg at 11/20/11 1207 rosuvastatin (CRESTOR) tablet 20 mg, 20 mg, Oral, q1800, John Loron, MD, 20 mg at 11/19/11 1612;  sodium phosphate (FLEET) 7-19 GM/118ML enema 1 enema, 1 enema, Rectal,  Once, John Ridges III, MD;  zolpidem (AMBIEN) tablet 10 mg, 10 mg, Oral, QHS PRN, John Loron, MD;  DISCONTD: 0.45 % NaCl with KCl 20 mEq / L infusion, , Intravenous, Continuous, John Ridges III, MD, Last Rate: 50 mL/hr at 11/20/11 0800 DISCONTD: diphenhydrAMINE (BENADRYL) 12.5 MG/5ML elixir 12.5 mg, 12.5 mg, Oral, Q6H PRN, John Loron, MD;  DISCONTD: diphenhydrAMINE (BENADRYL) injection 12.5 mg, 12.5 mg, Intravenous, Q6H PRN, John Loron, MD;  DISCONTD: HYDROcodone-acetaminophen (NORCO) 5-325 MG per tablet 1-2 tablet, 1-2 tablet, Oral, Q4H PRN, John Loron, MD, 1 tablet at 11/20/11 1610 DISCONTD: morphine 1 MG/ML PCA injection, , Intravenous, Q4H, John Loron, MD, 13.5 mg at 11/19/11 1200;  DISCONTD: morphine 4 MG/ML injection 4 mg, 4 mg, Intravenous, Q2H PRN, John Caldron, PA, 4 mg at 11/18/11 1539;  DISCONTD: naloxone [facility]) injection 0.4 mg, 0.4 mg, Intravenous, PRN, John Loron, MD;  DISCONTD: ondansetron (ZOFRAN) injection 4 mg, 4 mg, Intravenous, Q6H PRN, John Rayburn, PA DISCONTD: ondansetron (ZOFRAN) injection 4 mg, 4 mg, Intravenous, Q6H PRN, John Loron, MD;  DISCONTD: sodium chloride 0.9 % injection 9 mL, 9 mL, Intravenous, PRN, John Loron, MD  Additional Precautions/Restrictions: Precautions Precautions: Back;Fall Precaution Comments: monitor BP closely for orthostatic pressure changes Required Braces or Orthoses: Yes Spinal Brace: Thoracolumbosacral orthotic Other Brace/Splint: abdominal binder (SCD used throughout session) Restrictions Weight Bearing Restrictions: No  Therapy Assessments Physical Therapy: Precautions Precautions: Back;Fall Precaution Comments: monitor BP closely for orthostatic pressure changes Required Braces or Orthoses: Yes Spinal Brace: Thoracolumbosacral orthotic Other Brace/Splint: abdominal binder (SCD used throughout session) Home Living Lives With: Spouse Receives Help From: Family Type of  Home: House Home Layout: One level Home Access: Stairs to enter Entrance Stairs-Rails: None Secretary/administrator of Steps: 3 Bathroom Shower/Tub: Publishing copy: Standard Bathroom Accessibility: Yes How Accessible: Accessible via wheelchair Home Adaptive Equipment: Crutches Prior Function Level of Independence: Independent with basic ADLs;Independent with gait;Independent with transfers Able to Take Stairs?: Reciprically Driving: Yes Vocation: Full time employment Coordination Gross Motor Movements are Fluid and Coordinated: No Fine Motor Movements are Fluid and Coordinated: Yes  Occupational Therapy: Precautions Precautions: Back;Fall Precaution Comments: monitor BP closely for orthostatic pressure changes Required Braces or Orthoses: Yes Spinal Brace: Thoracolumbosacral orthotic Other Brace/Splint: abdominal binder (SCD used throughout session) Home Living Lives With: Spouse Receives Help From: Family Type of Home: House Home Layout: One level Home Access: Stairs to enter Entrance Stairs-Rails: None Secretary/administrator of Steps: 3 Bathroom Shower/Tub: Publishing copy: Standard Bathroom Accessibility: Yes How Accessible: Accessible via wheelchair Home Adaptive Equipment: Crutches Prior Function Level of Independence: Independent with basic ADLs;Independent with gait;Independent with transfers Able to Take Stairs?: Reciprically Driving: Yes 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 Pt with posterior lean without Lt UE support Where Assessed - Grooming: Sitting, bed;Supported Upper Body Dressing: Performed;+2 Total assistance;Comment for patient % (10%) Upper Body Dressing Details (indicate cue type and reason): Pt assisting therapist with Log Rolling to don brace, binder prior to beginning session Where Assessed - Upper Body Dressing: Supine, head of bed up Toilet Transfer: +1 Total assistance;Simulated (due to incontinence with spinal cord level of injury) Toilet Transfer Details (indicate cue type and reason): Pt currently with foley and could benefit from bowel and bladder program Toilet Transfer Method: Not assessed Ambulation Related to ADLs: none ADL Comments: Pt provided interrupter on the phone to translate into spanish. pt does understand some english statements demonstrating by responding to question prior to translation. Pt with abdonmina binder placed under TLSO this session to (A) with BP . Pt tolerating bed mobility well and increased participation. Pt supine <> sit Total + 2 hooking elbow with therapist to pull UB forward. Pt sitting EOB ~15-20 minutes during session. Pt sitting EOB min guard ~3 minutes. Pt with elbow extensino with UE adducted posture positioning (scapula retracted and downwardly rotated) Pt washing face at EOB. Pt scooting toward Hob with total+2 (A) in prep for sliding board transfers. Pt diaphortic during session- foley checked and in good positioning. Question sweating due to addition of abdominal binder this session. Pt positioned in chair position at end of session and wedge placed under feet for 90 degree ankle flexion. Wife educated on positioning  Prior Function: Level of Independence: Independent with basic ADLs;Independent with gait;Independent with transfers Able to Take Stairs?: Reciprically Driving: Yes Vocation: Full time employment ADL Eating/Feeding: Simulated;Set up Eating/Feeding Details  (indicate cue type and reason): wife present and assisting pt Where Assessed - Eating/Feeding: Bed level Grooming: Performed;Wash/dry hands;Moderate assistance (supported) Grooming Details (indicate cue type and reason): Pt wiping face at EOB supported. Pt with posterior lean without Lt UE support Where Assessed - Grooming: Sitting, bed;Supported Upper Body Dressing: Performed;+2 Total assistance;Comment for patient % (10%) Upper Body Dressing Details (indicate cue type and reason): Pt assisting therapist with Log Rolling to don brace, binder prior to beginning session Where Assessed - Upper Body Dressing: Supine, head of bed up Toilet Transfer: +1 Total assistance;Simulated (due to incontinence with spinal cord level of injury) Toilet Transfer Details (indicate cue type and reason): Pt currently with foley and could benefit from bowel and bladder program Toilet Transfer Method: Not assessed Ambulation Related to ADLs: none ADL Comments: Pt provided interrupter on the phone to translate into spanish. pt does understand some english statements demonstrating by responding to question prior to translation. Pt with abdonmina binder placed under TLSO this session to (A) with BP . Pt tolerating bed mobility well and increased participation. Pt supine <> sit Total + 2 hooking elbow with therapist to pull UB forward. Pt sitting EOB ~15-20 minutes during session. Pt sitting EOB min guard ~3 minutes. Pt with elbow extensino with UE adducted posture positioning (scapula retracted and downwardly rotated) Pt washing face at EOB. Pt scooting toward Hob with total+2 (A) in prep for sliding board transfers. Pt diaphortic during session- foley checked and in good  positioning. Question sweating due to addition of abdominal binder this session. Pt positioned in chair position at end of session and wedge placed under feet for 90 degree ankle flexion. Wife educated on positioning  Additional Prior Functional Levels:  Bed  Mobility: I Transfers: I Mobility - Walk/Wheelchair: I Upper Body Dressing: I Lower Body Dressing: I Grooming: I Eating/Drinking: I Toilet Transfer: I Bladder Continence: WNL Bowel Management: WNL Stair Climbing: I Communication: WNL Memory: WNL Cooking/Meal Prep: I Housework: I Money Management: I Driving: yes  Prior Activity Level: Community (5-7x/wk): Worked fulltime  ADLs/Mobility: ADL Eating/Feeding: Simulated;Set up Eating/Feeding Details (indicate cue type and reason): wife present and assisting pt Where Assessed - Eating/Feeding: Bed level Grooming: Performed;Wash/dry hands;Moderate assistance (supported) Grooming Details (indicate cue type and reason): Pt wiping face at EOB supported. Pt with posterior lean without Lt UE support Where Assessed - Grooming: Sitting, bed;Supported Upper Body Dressing: Performed;+2 Total assistance;Comment for patient % (10%) Upper Body Dressing Details (indicate cue type and reason): Pt assisting therapist with Log Rolling to don brace, binder prior to beginning session Where Assessed - Upper Body Dressing: Supine, head of bed up Toilet Transfer: +1 Total assistance;Simulated (due to incontinence with spinal cord level of injury) Toilet Transfer Details (indicate cue type and reason): Pt currently with foley and could benefit from bowel and bladder program Toilet Transfer Method: Not assessed Ambulation Related to ADLs: none ADL Comments: Pt provided interrupter on the phone to translate into spanish. pt does understand some english statements demonstrating by responding to question prior to translation. Pt with abdonmina binder placed under TLSO this session to (A) with BP . Pt tolerating bed mobility well and increased participation. Pt supine <> sit Total + 2 hooking elbow with therapist to pull UB forward. Pt sitting EOB ~15-20 minutes during session. Pt sitting EOB min guard ~3 minutes. Pt with elbow extensino with UE adducted posture  positioning (scapula retracted and downwardly rotated) Pt washing face at EOB. Pt scooting toward Hob with total+2 (A) in prep for sliding board transfers. Pt diaphortic during session- foley checked and in good positioning. Question sweating due to addition of abdominal binder this session. Pt positioned in chair position at end of session and wedge placed under feet for 90 degree ankle flexion. Wife educated on positioning  Bed Mobility Bed Mobility: Yes Rolling Right: 3: Mod assist;Other (comment) (elbow to elbow hooking) Rolling Right Details (indicate cue type and reason): VC for hand placement. Pt able to assist with trunk and UEs, physical assist needed for LEs Rolling Left: 3: Mod assist Rolling Left Details (indicate cue type and reason):   VC for hand placement and sequencing. Pt able to assist with trunk and UEs, physical assist for LE placement  Supine to Sit: 1: +2 Total assist;Patient percentage (comment) Supine to Sit Details (indicate cue type and reason): Pt able to assist more with UEs although still required max assist of trunk and LEs. VC throughout for hand placement and sequencing Scooting to HOB: 1: +2 Total assist Scooting to [facility] Details (indicate cue type and reason):   Drawsheet assist x 2 to bring pt to [facility] and to allign legs  Transfers Transfers: Yes Lateral/Scoot Transfers: 1: +2 Total assist;Patient percentage (comment) (15) Lateral/Scoot Transfer Details (indicate cue type and reason): VC for hand placement and sequencing. Pt assisted with UEs, R>L to scoot to the right. Drawsheet used as well as trunk support posteriorly to assist with scooting Ambulation/Gait Ambulation/Gait: No Balance Balance Assessed: Yes Static Sitting Balance  Static Sitting - Balance Support: Bilateral upper extremity supported;Feet supported Static Sitting - Level of Assistance: 5: Stand by assistance;2: Max assist Static Sitting - Comment/# of Minutes: Pt able to maintain sitting balance  for increased periods of time today, up to 1 minute without support. VC for hand placement and technique to find center of gravity to support self in sitting. With increased fatigue, pt required max assist to support. Pt unable to maintain balance when asked to use an UE for a functional task. Tactile cues through shoulders post anteriorly and posteriorly to assist pt with maintaining center of gravity  Home Assistive Devices/Equipment:  Home Assistive Devices/Equipment Home Assistive Devices/Equipment: Wheelchair  Discharge Planning:  Living Arrangements: Spouse/significant other Support Systems: Spouse/significant other;Children Do you have any problems obtaining your medications?: No Type of Residence: Private residence Home Care Services: No Patient expects to be discharged to:: home Case Management Consult Needed: Yes (Comment)  Current Functional Levels:  Bladder Continence: Foley catheter Bowel Management: Last BM 02/04 Previous Home Environment:  Living Arrangements: Spouse/significant other Support Systems: Spouse/significant other;Children Do you have any problems obtaining your medications?: No Type of Residence: Private residence Home Care Services: No Patient expects to be discharged to:: home  Discharge Living Setting:  Plans for Discharge Living Setting: House;Lives with (comment) (Lives with wife  and 3 daughters) Discharge Living Setting Number of Levels: 1 Discharge Living Setting Number of Steps: 3 Discharge Living Setting is Bedroom on Main Floor?: Yes Discharge Living Setting is Bathroom on Main Floor?: Yes  Social/Family/Support Systems:  Patient Roles: Spouse;Parent Contact Information: John Meza - dtr (c) [phone] and John Meza - dtr (c) [phone] Anticipated Caregiver: John Meza - wife Anticipated Caregiver's Contact Information: John Meza [phone] Ability/Limitations of Caregiver: wife can assist, does not work Engineer, structural Availability:  24/7 Discharge Plan Discussed with Primary Caregiver: Yes Is Caregiver In Agreement with Plan?: Yes Does Caregiver/Family have Issues with Lodging/Transportation while Pt is in Rehab?: No  Goals/Additional Needs:  Patient/Family Goal for Rehab: Mod I to Min A PT/OT goals (ELOS = 2-3 weeks) Cultural Considerations: From [place], speaks Spanish, is Christian Dietary Needs: Carb mod Med Calorie diabetic diet Equipment Needs: TBD Pt/Family Agrees to Admission and willing to participate: Yes Program Orientation Provided & Reviewed with Pt/Caregiver Including Roles  & Responsibilities: Yes  Preadmission Screen Completed By:  Trish Mage, 11/20/2011 2:01 PM  Patient's condition:  This patient's condition remains as documented in the Consult dated 11/19/11, in which the Rehabilitation Physician determined and documented that the patient's condition is appropriate for intensive rehabilitative care in an inpatient rehabilitation facility.  Preadmission Screen Competed by :Roderic Palau, RN, Time/Date,1357/11/19/13.  Discussed status with Dr. Riley Kill on 11/20/11 at 1400 (time/date) and received telephone approval for admission today.  Admission Coordinator:  Trish Mage, time1400/Date02/08/13

## 2011-11-20 NOTE — Progress Notes (Signed)
Physical Therapy Treatment Patient Details Name: John Meza MRN: [id number] DOB: 22-Nov-1960 Today's Date: 11/20/2011  PT Assessment/Plan  PT - Assessment/Plan Comments on Treatment Session: Pt progressing well this session. He was able to maintain sitting balance with standby support and minimal tactile cues for up to 1 minute numerous times. Began lateral transfers to begin process towards sliding board transfers. Pts wife also educated and provided with handout regarding PROM for LEs. Wife educated to complete 1-2 times per day. Will continue per plan, increasing flexibility and mobility for increased functional mobility PT Plan: Discharge plan remains appropriate;Frequency remains appropriate PT Frequency: Min 4X/week Recommendations for Other Services: Rehab consult Follow Up Recommendations: Inpatient Rehab Equipment Recommended: Defer to next venue PT Goals  Acute Rehab PT Goals PT Goal Formulation: With patient/family PT Goal: Rolling Supine to Right Side - Progress: Progressing toward goal PT Goal: Rolling Supine to Left Side - Progress: Progressing toward goal PT Goal: Supine/Side to Sit - Progress: Progressing toward goal PT Goal: Sit at Edge Of Bed - Progress: Progressing toward goal PT Transfer Goal: Bed to Chair/Chair to Bed - Progress: Progressing toward goal Additional Goals PT Goal: Additional Goal #1 - Progress: Progressing toward goal  PT Treatment Precautions/Restrictions  Precautions Precautions: Back;Fall Required Braces or Orthoses: Yes Spinal Brace: Thoracolumbosacral orthotic Other Brace/Splint: abdominal binder Restrictions Weight Bearing Restrictions: No Mobility (including Balance) Bed Mobility Bed Mobility: Yes Rolling Right: 3: Mod assist Rolling Right Details (indicate cue type and reason): VC for hand placement. Pt able to assist with trunk and UEs, physical assist needed for LEs Rolling Left: 3: Mod assist Rolling Left Details (indicate cue type  and reason): VC for hand placement and sequencing. Pt able to assist with trunk and UEs, physical assist for LE placement Supine to Sit: 1: +2 Total assist;Patient percentage (comment) (20) Supine to Sit Details (indicate cue type and reason): Pt able to assist more with UEs although still required max assist of trunk and LEs. VC throughout for hand placement and sequencing Scooting to HOB: 1: +2 Total assist Scooting to [facility] Details (indicate cue type and reason): Drawsheet assist x 2 to bring pt to [facility] and to allign legs Transfers Transfers: Yes Lateral/Scoot Transfers: 1: +2 Total assist;Patient percentage (comment) (15) Lateral/Scoot Transfer Details (indicate cue type and reason): VC for hand placement and sequencing. Pt assisted with UEs, R>L to scoot to the right. Drawsheet used as well as trunk support posteriorly to assist with scooting Ambulation/Gait Ambulation/Gait: No  Balance Balance Assessed: Yes Static Sitting Balance Static Sitting - Balance Support: Bilateral upper extremity supported;Feet supported Static Sitting - Level of Assistance: 5: Stand by assistance;2: Max assist Static Sitting - Comment/# of Minutes: Pt able to maintain sitting balance for increased periods of time today, up to 1 minute without support. VC for hand placement and technique to find center of gravity to support self in sitting. With increased fatigue, pt required max assist to support. Pt unable to maintain balance when asked to use an UE for a functional task. Tactile cues through shoulders post anteriorly and posteriorly to assist pt with maintaining center of gravity Exercise  General Exercises - Lower Extremity Hip ABduction/ADduction: PROM;Both;10 reps;Other (comment) (10 second hold) Straight Leg Raises: 10 reps;PROM;Both;Other (comment) (10 second hold) Other Exercises Other Exercises: PROM ankle DF 10 reps, 10 second hold; bilaterally Other Exercises: PROM knee flexion with hip flexion; 10  reps; 10 second hold; bilaterally End of Session PT - End of Session Equipment Utilized During Treatment: Back  brace;Other (comment) (Abdominal binder) Activity Tolerance: Patient tolerated treatment well Patient left: in bed;with call bell in reach;with family/visitor present (bed in chair position) Nurse Communication: Mobility status for transfers General Behavior During Session: [facility] for tasks performed Cognition: [facility] for tasks performed  Milana Kidney 11/20/2011, 10:06 AM

## 2011-11-20 NOTE — Progress Notes (Signed)
Interpreter Wyvonnia Dusky for Dr Verdon Cummins CSW 14.00

## 2011-11-20 NOTE — Progress Notes (Signed)
Occupational Therapy Treatment Patient Details Name: John Meza MRN: [id number] DOB: 11/09/1960 Today's Date: 11/20/2011  OT Assessment/Plan OT Assessment/Plan Comments on Treatment Session: Pt demonstrating progress this session and will attempt OOB next session. Pt able to maintain BP during session. Pt with HR in low 100s during transfers today.  OT Plan: Discharge plan remains appropriate OT Frequency: Min 2X/week Recommendations for Other Services: Rehab consult Follow Up Recommendations: Inpatient Rehab Equipment Recommended: Defer to next venue OT Goals Acute Rehab OT Goals OT Goal Formulation: With patient/family Time For Goal Achievement: 2 weeks Miscellaneous OT Goals Miscellaneous OT Goal #1: Pt will complete LOG roll to don brace with Mod A as precursor to adls OT Goal: Miscellaneous Goal #1 - Progress: Progressing toward goals Miscellaneous OT Goal #2: Pt will sit EOB Min Guard A for ~3 minutes as precursor to w/c level ADLS OT Goal: Miscellaneous Goal #2 - Progress: Met Miscellaneous OT Goal #3: Pt will use BIl UE to scoot to [facility] Total +2 Pt 20% as precursor to mobility into chair sliding board transfers. OT Goal: Miscellaneous Goal #3 - Progress: Progressing toward goals  OT Treatment Precautions/Restrictions  Precautions Precautions: Back;Fall Precaution Comments: monitor BP closely for orthostatic pressure changes Required Braces or Orthoses: Yes Spinal Brace: Thoracolumbosacral orthotic Other Brace/Splint: abdominal binder (SCD used throughout session) Restrictions Weight Bearing Restrictions: No   ADL ADL Grooming: Performed;Wash/dry hands;Moderate assistance (supported) Grooming Details (indicate cue type and reason): Pt wiping face at EOB supported. Pt with posterior lean without Lt UE support Where Assessed - Grooming: Sitting, bed;Supported Upper Body Dressing: Performed;+2 Total assistance;Comment for patient % (10%) Upper Body Dressing Details  (indicate cue type and reason): Pt assisting therapist with Log Rolling to don brace, binder prior to beginning session Where Assessed - Upper Body Dressing: Supine, head of bed up Ambulation Related to ADLs: none ADL Comments: Pt provided interrupter on the phone to translate into spanish. pt does understand some english statements demonstrating by responding to question prior to translation. Pt with abdonmina binder placed under TLSO this session to (A) with BP . Pt tolerating bed mobility well and increased participation. Pt supine <> sit Total + 2 hooking elbow with therapist to pull UB forward. Pt sitting EOB ~15-20 minutes during session. Pt sitting EOB min guard ~3 minutes. Pt with elbow extensino with UE adducted posture positioning (scapula retracted and downwardly rotated) Pt washing face at EOB. Pt scooting toward [place] with total+2 (A) in prep for sliding board transfers. Pt diaphortic during session- foley checked and in good positioning. Question sweating due to addition of abdominal binder this session. Pt positioned in chair position at end of session and wedge placed under feet for 90 degree ankle flexion. Wife educated on positioning Mobility  Bed Mobility Bed Mobility: Yes Rolling Right: 3: Mod assist;Other (comment) (elbow to elbow hooking) Rolling Right Details (indicate cue type and reason): VC for hand placement. Pt able to assist with trunk and UEs, physical assist needed for LEs Rolling Left: 3: Mod assist Rolling Left Details (indicate cue type and reason):   VC for hand placement and sequencing. Pt able to assist with trunk and UEs, physical assist for LE placement  Supine to Sit: 1: +2 Total assist;Patient percentage (comment) Supine to Sit Details (indicate cue type and reason): Pt able to assist more with UEs although still required max assist of trunk and LEs. VC throughout for hand placement and sequencing Scooting to [facility]: 1: +2 Total assist Scooting to [facility] Details  (indicate  cue type and reason):   Drawsheet assist x 2 to bring pt to [facility] and to allign legs  Transfers Transfers: No Exercises    End of Session OT - End of Session Equipment Utilized During Treatment: Back brace Activity Tolerance: Patient tolerated treatment well Patient left: in bed;with call bell in reach;with family/visitor present General Behavior During Session: [facility] for tasks performed Cognition: [facility] for tasks performed  Lucile Shutters  11/20/2011, 2:00 PM Pager: [phone]

## 2011-11-20 NOTE — Progress Notes (Signed)
Trauma Service Note  Subjective: Looks great in sitting position.  Objective: Vital signs in last 24 hours: Temp:  [98.8 F (37.1 C)-100.3 F (37.9 C)] 99.6 F (37.6 C) (02/08 0400) Pulse Rate:  [90-150] 104  (02/08 0925) Resp:  [14-23] 15  (02/08 0800) BP: (102-130)/(66-90) 127/83 mmHg (02/08 0925) SpO2:  [89 %-100 %] 99 % (02/08 0800) Last BM Date: 11/15/11  Intake/Output from previous day: 02/07 0701 - 02/08 0700 In: 1380 [P.O.:120; I.V.:1250; IV Piggyback:10] Out: 1330 [Urine:1330] Intake/Output this shift: Total I/O In: 50 [I.V.:50] Out: 50 [Urine:50]  General: No acute distress  Lungs: Clear to auscultation.  CXR bettere  Abd: Soft, no bowel movement.  Had Dulcolax without results.  Extremities: No new problems.  Will get Dopplers  Neuro: No changes  Lab Results: CBC   Basename 11/19/11 0525 11/19/11 0112  WBC 5.6 --  HGB 11.3* 11.3*  HCT 33.2* 33.0*  PLT 112* --   BMET  Basename 11/19/11 0525  NA 135  K 4.2  CL 100  CO2 25  GLUCOSE 234*  BUN 12  CREATININE 0.70  CALCIUM 8.3*   PT/INR No results found for this basename: LABPROT:2,INR:2 in the last 72 hours ABG  Basename 11/19/11 0030  PHART 7.311*  HCO3 26.1*    Studies/Results: Dg Lumbar Spine 2-3 Views  11/18/2011  *RADIOLOGY REPORT*  Clinical Data: T11 fracture  LUMBAR SPINE - 2-3 VIEW  Comparison: CT 11/16/2011  Findings: A series of four intraoperative fluoroscopic spot images document posterior fusion with bilateral pedicle screws at T9, T10, L1, and L2 with vertical interconnecting rods, intact.  T11 fracture is poorly visualized.  IMPRESSION:  1.  Posterior spinal fusion T9-L2.  Original Report Authenticated By: Osa Craver, M.D.   Dg Lumbar Spine 1 View  11/18/2011  *RADIOLOGY REPORT*  Clinical Data: T11 fracture  LUMBAR SPINE - 1 VIEW  Comparison: 11/16/2011  Findings: On this single lateral intraoperative radiograph, a linear metallic density projects across the T12-L1  interspace. Comminuted fracture of the T11 vertebral body is noted, partially obscured by overlying marker.  IMPRESSION:  1.  Intraoperative localization.  Original Report Authenticated By: Osa Craver, M.D.   Dg Chest Port 1 View  11/20/2011  *RADIOLOGY REPORT*  Clinical Data: Right lower lobe atelectasis  PORTABLE CHEST - 1 VIEW  Comparison: Yesterday  Findings: Right basilar atelectasis and pleural fluid has improved. Left basilar atelectasis has also minimally improved.  Heart remains prominent.  No pneumothorax.  Fixation hardware in the thoracolumbar spine is stable.  IMPRESSION: Improved bibasilar atelectasis.  Original Report Authenticated By: Donavan Burnet, M.D.   Dg Chest Port 1 View  11/19/2011  *RADIOLOGY REPORT*  Clinical Data: Difficulty breathing, decreased sats  PORTABLE CHEST - 1 VIEW  Comparison: 11/16/2011  Findings: There is a probable right pleural effusion and new dense consolidation / atelectasis at the right lung base. There is some associated volume loss with mild rightward mediastinal shift.  Mild cardiomegaly stable.  Patchy interstitial opacities at the left lung base.  Interval placement of spinal fusion hardware across the thoracolumbar junction.  IMPRESSION:  1.  New right lower lung atelectasis/consolidation with volume loss and suspected pleural effusion.  Original Report Authenticated By: Osa Craver, M.D.   Dg C-arm Gt 120 Min  11/18/2011  CLINICAL DATA: T9-L1 fusion   C-ARM GT 120 MIN  Fluoroscopy was utilized by the requesting physician.  No radiographic  interpretation.      Anti-infectives: Anti-infectives  Start     Dose/Rate Route Frequency Ordered Stop   11/19/11 0200   ceFAZolin (ANCEF) IVPB 2 g/50 mL premix        2 g 100 mL/hr over 30 Minutes Intravenous Every 8 hours 11/18/11 2334 11/19/11 0942   11/18/11 2345   ceFAZolin (ANCEF) IVPB 2 g/50 mL premix  Status:  Discontinued        2 g 100 mL/hr over 30 Minutes Intravenous 60 min  pre-op 11/18/11 2336 11/18/11 2345   11/18/11 1820   bacitracin 50,000 Units in sodium chloride irrigation 0.9 % 500 mL irrigation  Status:  Discontinued          As needed 11/18/11 1820 11/18/11 2147   11/18/11 1748   ceFAZolin (ANCEF) 2-3 GM-% IVPB SOLR     Comments: WILLIAMS, ALANA: cabinet override         11/18/11 1748 11/19/11 0559   11/18/11 1541   bacitracin 16109 UNITS injection     Comments: Worthy Flank: cabinet override         11/18/11 1541 11/19/11 0344   11/18/11 0000   ceFAZolin (ANCEF) IVPB 1 g/50 mL premix  Status:  Discontinued        1 g 100 mL/hr over 30 Minutes Intravenous 60 min pre-op 11/17/11 1422 11/17/11 1423          Assessment/Plan: s/p Procedure(s): POSTERIOR LUMBAR FUSION 1 LEVEL Plan for Doppler studies today to R/O DVT Possible Rehab later today. SL IVF  LOS: 4 days   Marta Lamas. Gae Bon, MD, FACS [phone] Trauma Surgeon 11/20/2011

## 2011-11-20 NOTE — Discharge Summary (Signed)
This patient has been seen and I agree with the findings and treatment plan.  Marcelyn Ruppe O. Tosca Pletz, III, MD, FACS [phone] (pager) [phone] (direct pager) Trauma Surgeon  

## 2011-11-20 NOTE — Plan of Care (Signed)
Problem: SCI BOWEL ELIMINATION Goal: RH STG MANAGE BOWEL WITH ASSISTANCE STG Manage Bowel with Assistance. Min assist Goal: RH STG SCI MANAGE BOWEL PROGRAM W/ASSIST OR AS APPROPRIATE STG SCI Manage bowel program w/assist or as appropriate. Min assist  Problem: SCI BLADDER ELIMINATION Goal: RH STG MANAGE BLADDER WITH EQUIPMENT WITH ASSISTANCE STG Manage Bladder With Equipment With Assistance Min assist  Problem: RH SKIN INTEGRITY Goal: RH STG SKIN FREE OF INFECTION/BREAKDOWN Mod assist Goal: RH STG MAINTAIN SKIN INTEGRITY WITH ASSISTANCE STG Maintain Skin Integrity With Assistance. Min assist Goal: RH STG ABLE TO PERFORM INCISION/WOUND CARE W/ASSISTANCE STG Able To Perform Incision/Wound Care With Assistance. Max assist ( on back)  Problem: RH SAFETY Goal: RH STG ADHERE TO SAFETY PRECAUTIONS W/ASSISTANCE/DEVICE STG Adhere to Safety Precautions With Assistance/Device. supervision Goal: RH STG DECREASED RISK OF FALL WITH ASSISTANCE STG Decreased Risk of Fall With Assistance. supervision Goal: RH STG DEMO UNDERSTANDING HOME SAFETY PRECAUTIONS supervision  Problem: RH PAIN MANAGEMENT Goal: RH STG PAIN MANAGED AT OR BELOW PT'S PAIN GOAL Managed 2/10  Problem: RH KNOWLEDGE DEFICIT Goal: RH STG INCREASE KNOWLEDGE OF DIABETES Mod. Independent

## 2011-11-20 NOTE — Progress Notes (Signed)
Pt arrived to room 4001 from 3100 via stretcher; accompanied by wife and daughters;  Reports mild discomfort at present; oriented to room and rehab process, safety plan reviewed; daughters present to interpret.  See assessment  FS. John Meza

## 2011-11-20 NOTE — Progress Notes (Signed)
Inpatient Diabetes Program Recommendations  AACE/ADA: New Consensus Statement on Inpatient Glycemic Control (2009)  Target Ranges:  Prepandial:   less than 140 mg/dL      Peak postprandial:   less than 180 mg/dL (1-2 hours)      Critically ill patients:  140 - 180 mg/dL   Reason for Visit:   Inpatient Diabetes Program Recommendations Insulin - Basal: increase Lantus to 15-20 units daily.  Note:

## 2011-11-20 NOTE — Discharge Summary (Signed)
Physician Discharge Summary  Patient ID: John Meza MRN: [id number] DOB/AGE: [birth year] 51 y.o.  Admit date: 11/16/2011 Discharge date: 11/20/2011  Discharge Diagnoses Patient Active Problem List  Diagnoses Date Noted  . 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 Concussion with brief LOC 11/16/2011  . Occipital scalp laceration 11/16/2011  . Accidental fall from ladder 11/16/2011  . DM, UNCOMPLICATED, TYPE II, UNCONTROLLED 05/26/2007  . OBESITY, MODERATE 05/26/2007  . HYPERLIPIDEMIA 05/25/2007  . ALLERGIC RHINITIS 05/25/2007  . DYSPEPSIA 05/25/2007    Consultants Dr. Lovell Sheehan for neurosurgery  Procedures T9-L1 fusion by Dr. Lovell Sheehan  HPI: This patient was approximately 10 feet up a ladder moving carpet when he lost his balance and fell onto his back onto concrete striking his head on a brick wall. There is a loss of consciousness for a brief time. He came into the hospital as a nontrauma code and it was not recognized for several hours that he could not move or feel anything below the waist. CT scan showed a T11 Chance fracture with complete spinal cord transection. He was admitted to the trauma service and neurosurgery was consulted.  Hospital Course: The patient was admitted to the neuro intensive care unit. He was kept on bedrest with log rolling until neurosurgery could perform a fusion. His pain was controlled on a PCA. The family did request a second opinion from neurology which was obtained. 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 Physical and occasional therapy were consulted and agreed with the need for inpatient rehabilitation. Approval was sought and obtained and the patient was able to be transferred there in stable condition.    Medication List  As of 11/20/2011  1:39 PM   STOP taking these medications         rosuvastatin 20 MG tablet         TAKE these medications         lisinopril 10 MG tablet   Commonly known as:  PRINIVIL,ZESTRIL   Take 10 mg by mouth daily.      metFORMIN 1000 MG tablet   Commonly known as: GLUCOPHAGE   Take 1,000 mg by mouth 2 (two) times daily with a meal.      niacin 1000 MG CR tablet   Commonly known as: NIASPAN   Take 1,000 mg by mouth at bedtime.      pioglitazone 45 MG tablet   Commonly known as: ACTOS   Take 45 mg by mouth daily.      simvastatin 20 MG tablet   Commonly known as: ZOCOR   Take 20 mg by mouth every evening.             Follow-up Information    Schedule an appointment as soon as possible for a visit with Cristi Loron, MD.   Contact information:   [address] [place] [address] [phone]          Signed: Freeman Caldron, PA-C Pager: [phone] General Trauma PA Pager: [phone]  11/20/2011, 1:39 PM

## 2011-11-20 NOTE — Progress Notes (Signed)
Patient ID: John Meza, male   DOB: 02/15/1961, 51 y.o.   MRN: [id number] Subjective:  Patient is alert and pleasant.  Objective: Vital signs in last 24 hours: Temp:  [98.8 F (37.1 C)-100.3 F (37.9 C)] 99.6 F (37.6 C) (02/08 0400) Pulse Rate:  [91-150] 92  (02/08 0700) Resp:  [14-23] 15  (02/08 0700) BP: (102-129)/(66-90) 115/69 mmHg (02/08 0700) SpO2:  [89 %-100 %] 100 % (02/08 0700)  Intake/Output from previous day: 02/07 0701 - 02/08 0700 In: 1380 [P.O.:120; I.V.:1250; IV Piggyback:10] Out: 1330 [Urine:1330] Intake/Output this shift:    Physical exam the patient is alert and oriented x3 he has complete paraplegia.  Lab Results:  Basename 11/19/11 0525 11/19/11 0112  WBC 5.6 --  HGB 11.3* 11.3*  HCT 33.2* 33.0*  PLT 112* --   BMET  Basename 11/19/11 0525  NA 135  K 4.2  CL 100  CO2 25  GLUCOSE 234*  BUN 12  CREATININE 0.70  CALCIUM 8.3*    Studies/Results: Dg Lumbar Spine 2-3 Views  11/18/2011  *RADIOLOGY REPORT*  Clinical Data: T11 fracture  LUMBAR SPINE - 2-3 VIEW  Comparison: CT 11/16/2011  Findings: A series of four intraoperative fluoroscopic spot images document posterior fusion with bilateral pedicle screws at T9, T10, L1, and L2 with vertical interconnecting rods, intact.  T11 fracture is poorly visualized.  IMPRESSION:  1.  Posterior spinal fusion T9-L2.  Original Report Authenticated By: Osa Craver, M.D.   Dg Lumbar Spine 1 View  11/18/2011  *RADIOLOGY REPORT*  Clinical Data: T11 fracture  LUMBAR SPINE - 1 VIEW  Comparison: 11/16/2011  Findings: On this single lateral intraoperative radiograph, a linear metallic density projects across the T12-L1 interspace. Comminuted fracture of the T11 vertebral body is noted, partially obscured by overlying marker.  IMPRESSION:  1.  Intraoperative localization.  Original Report Authenticated By: Osa Craver, M.D.   Dg Chest Port 1 View  11/20/2011  *RADIOLOGY REPORT*  Clinical Data: Right lower  lobe atelectasis  PORTABLE CHEST - 1 VIEW  Comparison: Yesterday  Findings: Right basilar atelectasis and pleural fluid has improved. Left basilar atelectasis has also minimally improved.  Heart remains prominent.  No pneumothorax.  Fixation hardware in the thoracolumbar spine is stable.  IMPRESSION: Improved bibasilar atelectasis.  Original Report Authenticated By: Donavan Burnet, M.D.   Dg Chest Port 1 View  11/19/2011  *RADIOLOGY REPORT*  Clinical Data: Difficulty breathing, decreased sats  PORTABLE CHEST - 1 VIEW  Comparison: 11/16/2011  Findings: There is a probable right pleural effusion and new dense consolidation / atelectasis at the right lung base. There is some associated volume loss with mild rightward mediastinal shift.  Mild cardiomegaly stable.  Patchy interstitial opacities at the left lung base.  Interval placement of spinal fusion hardware across the thoracolumbar junction.  IMPRESSION:  1.  New right lower lung atelectasis/consolidation with volume loss and suspected pleural effusion.  Original Report Authenticated By: Osa Craver, M.D.   Dg C-arm Gt 120 Min  11/18/2011  CLINICAL DATA: T9-L1 fusion   C-ARM GT 120 MIN  Fluoroscopy was utilized by the requesting physician.  No radiographic  interpretation.      Assessment/Plan: Postop day #2 I'll ask rehabilitation to see the patient.  LOS: 4 days     Pollyann Roa D 11/20/2011, 8:09 AM

## 2011-11-21 DIAGNOSIS — K592 Neurogenic bowel, not elsewhere classified: Secondary | ICD-10-CM

## 2011-11-21 DIAGNOSIS — W11XXXA Fall on and from ladder, initial encounter: Secondary | ICD-10-CM

## 2011-11-21 DIAGNOSIS — IMO0002 Reserved for concepts with insufficient information to code with codable children: Secondary | ICD-10-CM

## 2011-11-21 DIAGNOSIS — M79609 Pain in unspecified limb: Secondary | ICD-10-CM

## 2011-11-21 DIAGNOSIS — Z5189 Encounter for other specified aftercare: Secondary | ICD-10-CM

## 2011-11-21 DIAGNOSIS — N319 Neuromuscular dysfunction of bladder, unspecified: Secondary | ICD-10-CM

## 2011-11-21 LAB — GLUCOSE, CAPILLARY
Glucose-Capillary: 231 mg/dL — ABNORMAL HIGH (ref 70–99)
Glucose-Capillary: 255 mg/dL — ABNORMAL HIGH (ref 70–99)

## 2011-11-21 NOTE — Progress Notes (Addendum)
Patient alert and oriented x 3- bowel program started this a.m at about 0850- suppository inserted and patient put on bedpan sitting up. Patient only had small amount of brownish/yellow mucus come out. Rectal vault clean. Patient will require maxi lift for transfer and has poor trunk control and reflexes so no BSC this morning. Patient given sorbitol at 1545 with no results at this time. To give mirilax before end of shift. Bowel sounds present and hypoactive. Patient reports some tenderness to abdomen- reports per interpreter that the skin just feels sensitive. Patient abdomen slightly distended. Dr Doroteo Bradford notified earlier in the afternoon. No new orders received- to try PRNs ordered first. Patient Foley intact. Back incision dressing dry and intact- no order seen to change. Patient received 10mg  oxy IR for pain at 1227 with some relief. Patient Dopplers done this am- negative. Neosporin applied to back of head.

## 2011-11-21 NOTE — Progress Notes (Signed)
Physical Therapy Assessment and Plan  Patient Details  Name: John Meza MRN: [id number] Date of Birth: [birth year]  PT Diagnosis: Abnormal posture, Hypotonia, Impaired sensation, Low back pain, Muscle weakness, Paraplegia and Pain in Left shoulder Rehab Potential: Good ELOS: 3 weeks+   Today's Date: 11/21/2011 Time: 1500-1600 Time Calculation (min): 60 min  Assessment & Plan Clinical Impression: Patient is a 51 y.o. year old male with recent admission to the hospital on 11/16/11 with SCI who was at work on a ladder when he lost his balance and fell 10 feet with +LOC briefly. Patient with inability to move BLE with numbness and pain posterior head. X- rays in ED with Markedly comminuted T11 fracture with Retropulsion and anterior tanslation of spinal canal with near obliteration of central canal as well his left lumbar L1 and lumbar L2 transverse process fractures. Patient underwent T9-L1 posterior lateral arthrodesis with bone graft on 02/06 pm. Post op with hypoxia requiring BIPAP. Latest followup chest x-ray was improved by basilar atelectasis. TLSO when out of bed must be applied in the supine position. Foley catheter tube remains in place. SCDs in place for deep vein thrombosis prophylaxis. Physical medicine and rehabilitation consulted for plan for inpatient rehabilitation services.    Patient transferred to CIR on 11/20/2011 .     Patient currently requires total with mobility secondary to muscle weakness and muscle paralysis and abnormal tone and decreased coordination.  Prior to hospitalization, patient was Independent with mobility and lived with Spouse in a House home.  Home access is 3Stairs to enter (handrail on Right ascending).  Patient will benefit from skilled PT intervention to maximize safe functional mobility, minimize fall risk and decrease caregiver burden for planned discharge home with 24 hour assist.  Anticipate patient will benefit from follow up [facility] at discharge.  PT - End  of Session Activity Tolerance: Tolerates 30+ min activity with multiple rests PT Assessment Rehab Potential: Good Barriers to Discharge: Inaccessible home environment (may need ramp for entrance/exit) PT Plan PT Frequency: 1-2 X/day, 60-90 minutes Estimated Length of Stay: 3 weeks+ PT Treatment/Interventions: Balance/vestibular training;DME/adaptive equipment instruction;Functional mobility training;Neuromuscular re-education;Pain management;Patient/family education;Splinting/orthotics;Therapeutic Activities;Therapeutic Exercise;UE/LE Strength taining/ROM;UE/LE Coordination activities;Wheelchair propulsion/positioning PT Recommendation Follow Up Recommendations: Home health PT Equipment Recommended: Sliding board;Tub/shower bench;3 in 1 bedside comode (further DME to be determined at discharge)  Precautions/Restrictions Spinal, TLSO when out of bed must be applied in the supine position. Falls Risk Spanish Speaker  General [email or web address] ([phone]::[id number]) Vital Signs See chart for details Pain 0/10 at rest and up to 9/10 with movement T-L-spine 5/10 L shoulder with movement  Home Living/Prior Functioning Married with 3 children at home (ages 84,19 and 88 yo) Independent with ADL/IADL's, worked fulltime  Vision/Perception  Normal  Cognition A&Ox4 Oceanographer Light Touch: Impaired Detail Light Touch Impaired Details: Absent RLE;Absent LLE Motor  Motor Motor: Paraplegia  Mobility Bed Mobility Bed Mobility: Yes Rolling Right: 2: Max assist;With rail Rolling Right Details: Tactile cues for initiation;Verbal cues for sequencing;Verbal cues for safe use of DME/AE;Manual facilitation for weight shifting;Manual facilitation for placement Rolling Left: 2: Max assist;With rail Rolling Left Details: Tactile cues for initiation;Verbal cues for sequencing;Verbal cues for safe use of DME/AE;Manual facilitation for weight shifting;Manual facilitation for placement Supine to Sit: 1:  +2 Total assist Supine to Sit Details (indicate cue type and reason): B LE's requiring Total assist, patient able to give minimal help with UE's to push up to sitting and this with max-A and max verbal cues  Scooting to [facility]: 1: +  2 Total assist Scooting to [facility] Details (indicate cue type and reason): Drawsheet assist x 2 to bring patient to [facility] Transfers Transfers: Yes Lateral/Scoot Transfers: 1: +2 Total assist;With slide board Lateral/Scoot Transfer Details: Tactile cues for initiation;Tactile cues for sequencing;Tactile cues for weight shifting;Tactile cues for posture;Tactile cues for placement;Tactile cues for weight beaing;Visual cues for safe use of DME/AE;Visual cues/gestures for precautions/safety;Verbal cues for sequencing;Verbal cues for technique;Verbal cues for precautions/safety;Verbal cues for safe use of DME/AE;Manual facilitation for weight shifting;Manual facilitation for placement Lateral/Scoot Transfer Details (indicate cue type and reason): bed <->hospital bed via slideboard Total Assist x 2 Locomotion  Ambulation Ambulation: No Gait Gait: No Wheelchair Mobility Wheelchair Mobility: No (Unable to test mobility while up in w/c briefly)  Trunk/Postural Assessment  Cervical Assessment Cervical Assessment: Within Functional Limits Postural Control Postural Control: Deficits on evaluation (TLSO brace on while in supine, B UE on bed with added min-A)  Balance Balance Balance Assessed: Yes Static Sitting Balance Static Sitting - Balance Support: Bilateral upper extremity supported;Feet supported (required additional min-A to maintaint static sitting at EOB) Static Sitting - Level of Assistance: 4: Min assist Extremity Assessment      RLE AROM (degrees) Overall AROM Right Lower Extremity: Deficits (Flaccid R LE) RLE PROM (degrees) Overall PROM Right Lower Extremity: Deficits (hamstring 50 degrees on SLR) RLE Strength RLE Overall Strength: Deficits (0/5) LLE  Assessment LLE Assessment: Exceptions to WFL LLE AROM (degrees) Overall AROM Left Lower Extremity: Deficits (Flaccid L LE) LLE PROM (degrees) Overall PROM Left Lower Extremity: Deficits (hamstring 65 degrees SLR) LLE Strength LLE Overall Strength: Deficits (0/5)  Recommendations for other services: None  Discharge Criteria: Patient will be discharged from PT if patient refuses treatment 3 consecutive times without medical reason, if treatment goals not met, if there is a change in medical status, if patient makes no progress towards goals or if patient is discharged from hospital.  The above assessment, treatment plan, treatment alternatives and goals were discussed and mutually agreed upon: by patient and by family  Jodelle Gross 11/21/2011, 5:40 PM

## 2011-11-21 NOTE — Progress Notes (Signed)
Bilateral lower extremity venous duplex completed.  Preliminary report is negative for DVT, SVT, or a Baker's cyst. 

## 2011-11-21 NOTE — Progress Notes (Signed)
Physical Therapy Note  Patient Details  Name: Brevin Mcfadden MRN: [id number] Date of Birth: [birth year] Today's Date: 11/21/2011 Time: 1430-1500 (30') Pain: L shoulder 5/10, spine (surgery site; T11-L1) 0-8/10 with movement Precautions: Spinal, must apply TLSO in supine before OOB/EOB,  Falls Risk, Paraplegia                       Spanish speaker  Therapeutic Activities; (30')  Application of TLSO in supine with wife present for training and assistance                                              Bed Mobility supine to sit via Left sidelying with Total-A x 2, patient able to give minimal assistance with UE's                                              Static sitting at EOB with min-A to maintain posture/balance                                              Transfer bed<->w/c via slide board with Total Assist x 2  Recommendation for nursing to use mechanical lift for any OOB/BTB transfers (TLSO brace on for this)   Individual Therapy Session   Jodelle Gross 11/21/2011, 6:07 PM

## 2011-11-21 NOTE — Progress Notes (Deleted)
Pt refused soap suds enema last night because family was in room until late. Stated she will try in the morning. I plan to administer SSE around 0600. Charisse March Hicks,RN

## 2011-11-21 NOTE — Progress Notes (Signed)
Patient ID: John Meza, male   DOB: 01-03-1961, 51 y.o.   MRN: [id number] Subjective/Complaints:  Review of Systems  Unable to perform ROS: language   Objective: Vital Signs: Blood pressure 117/69, pulse 97, temperature 99.4 F (37.4 C), temperature source Oral, resp. rate 18, SpO2 97.00%. Dg Chest Port 1 View  11/20/2011  *RADIOLOGY REPORT*  Clinical Data: Right lower lobe atelectasis  PORTABLE CHEST - 1 VIEW  Comparison: Yesterday  Findings: Right basilar atelectasis and pleural fluid has improved. Left basilar atelectasis has also minimally improved.  Heart remains prominent.  No pneumothorax.  Fixation hardware in the thoracolumbar spine is stable.  IMPRESSION: Improved bibasilar atelectasis.  Original Report Authenticated By: Donavan Burnet, M.D.   Results for orders placed during the hospital encounter of 11/20/11 (from the past 72 hour(s))  GLUCOSE, CAPILLARY     Status: Abnormal   Collection Time   11/20/11  9:08 PM      Component Value Range Comment   Glucose-Capillary 262 (*) 70 - 99 (mg/dL)    Comment 1 Notify RN      Physical Exam  Nursing note and vitals reviewed.  Constitutional: He is oriented to person, place, and time. He appears well-developed and well-nourished.  HENT:  Head: Normocephalic.  Eyes: Pupils are equal, round, and reactive to light.  Neck: Normal range of motion. Neck supple.  Cardiovascular: Normal rate.  Pulmonary/Chest:  Decreased breath sounds RLL. Dyspnea with deep inspiration.  Abdominal: Soft. Bowel sounds are normal. He exhibits no distension. There is no tenderness.  Musculoskeletal: He exhibits no edema.  Neurological: He is alert and oriented to person, place, and time. He displays abnormal reflex.   He has 0 out of 5 strength in both lower extremities. Areflexic-BLE. Follows commands  Upper extremity motor  intact.  There is a language barrier.  Skin: Skin is warm and dry.  Psychiatric: He has a normal mood and affect. His behavior is normal.   Ext:  No swelling or edema in BLE, no discoloration   Assessment/Plan: 1. 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 Physiatrist is providing close team supervision and 24 hour management of active medical problems listed below. Physiatrist and rehab team continue to assess barriers to discharge/monitor patient progress toward functional and medical goals. FIM:                   Comprehension Comprehension Mode: Auditory Comprehension: 4-Understands basic 75 - 89% of the time/requires cueing 10 - 24% of the time  Expression Expression Mode: Verbal Expression: 4-Expresses basic 75 - 89% of the time/requires cueing 10 - 24% of the time. Needs helper to occlude trach/needs to repeat words.  Social Interaction Social Interaction: 5-Interacts appropriately 90% of the time - Needs monitoring or encouragement for participation or interaction.  Problem Solving Problem Solving: 4-Solves basic 75 - 89% of the time/requires cueing 10 - 24% of the time  Memory Memory: 5-Recognizes or recalls 90% of the time/requires cueing < 10% of the time  2. Anticoagulation/DVT prophylaxis with Pharmaceutical: No lovenox yet continue SCD 3. Pain Management: oxycodone  Medical Problem List and Plan:  1. 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 DVT Prophylaxis/Anticoagulation: SCDs. Will discuss subcutaneous Lovenox. Monitor for any signs of deep vein thrombosis. Needs Doppler study  3. Pain management. Lyrica 75 mg twice daily, oxycodone as  needed. Monitor with increased activity. Patient complains generally of low back pain and denies dysesthesias in the lower extremities.  4. Neurogenic bowel and bladder. Foley catheter tube currently in place. Initiate bowel program Provide patient and family education  5. Diabetes  mellitus. Hemoglobin A1c of 10.2. Currently with Lantus insulin 10 units at bedtime. Patient on oral agents of Actos 45 mg daily prior to admission as well as Glucophage 1000 mg daily. Check blood sugars a.c. and at bedtime. Provide diabetic teaching. Needs excellent sugar control to optimize wound healing  6. Hyperlipidemia. Crestor  LOS (Days) 1 A FACE TO FACE EVALUATION WAS PERFORMED  KIRSTEINS,ANDREW E 11/21/2011, 6:51 AM

## 2011-11-21 NOTE — Plan of Care (Signed)
Problem: SCI BOWEL ELIMINATION Goal: RH STG SCI MANAGE BOWEL PROGRAM W/ASSIST OR AS APPROPRIATE STG SCI Manage bowel program w/assist or as appropriate.  Outcome: Not Progressing Patient had no BM after suppository this a/m - rectal vault clear. Work on getting up to [facility] after therapy has worked with them. To try PRN sorbitol   Problem: SCI BLADDER ELIMINATION Goal: RH STG MANAGE BLADDER WITH EQUIPMENT WITH ASSISTANCE STG Manage Bladder With Equipment With Assistance  Outcome: Not Met (add Reason) Patient is using foley with total care cath maitnance  Problem: RH SKIN INTEGRITY Goal: RH STG SKIN FREE OF INFECTION/BREAKDOWN Outcome: Progressing No skin issues outside of incison noted.   Problem: RH SAFETY Goal: RH STG ADHERE TO SAFETY PRECAUTIONS W/ASSISTANCE/DEVICE STG Adhere to Safety Precautions With Assistance/Device.  Outcome: Progressing Patient wife in room- calls for assistance for patient

## 2011-11-21 NOTE — Evaluation (Signed)
Occupational Therapy Assessment and Plan  Patient Details  Name: John Meza MRN: [id number] Date of Birth: 1961/06/15  OT Diagnosis: Paraplegia, T11 level Rehab Potential: Rehab Potential: Good ELOS: 3 weeks   Today's Date: 11/21/2011 Time: 1500-1600 Time Calculation (min): 60 min  Assessment & Plan Clinical Impression: John Meza is an 51 y.o. Hispanic male with history of DM, obesity, who was at work on a ladder on 02./04, when he lost his balance and fell 10 feet with +LOC briefly. Patient with inability to move BLE with numbness and pain posterior head. X- rays in ED with Markedly comminuted T11 fracture with Retropulsion and anterior tanslation of spinal canal with near obliteration of central canal as well his left lumbar L1 and lumbar L2 transverse process fractures.. Cranial CT scan was negative for acute changes or skull fracture. Evaluated by Dr. Lovell Sheehan And patient underwent T9-L1 posterior lateral arthrodesis with bone graft on 02/06 pm. Post op with hypoxia requiring BIPAP. Latest followup chest x-ray was improved by basilar atelectasis. TLSO when out of bed must be applied in the supine position. Foley catheter tube remains in place. SCDs in place for deep vein thrombosis prophylaxis. Physical medicine and rehabilitation consulted for plan for inpatient rehabilitation services.    Patient currently requires max with basic self-care skills secondary to muscle paralysis.  Prior to hospitalization, patient could complete ADL/IADL independently.  Patient will benefit from skilled intervention to decrease level of assist with basic self-care skills prior to discharge home with care partner.  Anticipate patient will require moderate physical assestance and follow up home health.  OT - End of Session Activity Tolerance: Tolerates 10 - 20 min activity with multiple rests OT Assessment Rehab Potential: Good OT Plan OT Frequency: 1-2 X/day, 60-90 minutes Estimated Length of Stay: 3  weeks OT Treatment/Interventions: Balance/vestibular training;DME/adaptive equipment instruction;Pain management;Patient/family education;Self Care/advanced ADL retraining;Therapeutic Activities;Therapeutic Exercise;UE/LE Strength taining/ROM;Wheelchair propulsion/positioning OT Recommendation Follow Up Recommendations: Home health OT Equipment Recommended: Wheelchair (measurements);Sliding board;Tub/shower seat;Wheelchair cushion (measurements);3 in 1 bedside comode  Precautions/Restrictions  Precautions: Back;Fall Precaution Booklet Issued: No Required Braces or Orthoses: Yes Spinal Brace: Thoracolumbosacral orthotic Restrictions Weight Bearing Restrictions: No  General Missed Time Reason: Other (comment) (Interruption to eval by doppler study) Family/Caregiver Present: Yes (spouse)   Pain Pain Assessment Pain Assessment: Faces Faces Pain Scale: Hurts even more Pain Type: Surgical pain Pain Location: Back Pain Orientation: Lower Pain Onset: With Activity Patients Stated Pain Goal: Other (Comment) Pain Intervention(s): Repositioned Multiple Pain Sites: Yes 2nd Pain Site Pain Location: Shoulder (LEFT) Pain Onset: With Activity Pain Intervention(s): Relaxation  Home Living/Prior Functioning Married with 3 children at home (ages 19,19 and 61 yo)  Independent with ADL/IADL's, worked fulltime  ADL ADL Equipment Provided: Other (comment) Advertising account executive for shaving) Eating: Minimal assistance Grooming: Minimal assistance Where Assessed-Grooming: Bed level Upper Body Bathing: Minimal assistance (due to left arm pain) Where Assessed-Upper Body Bathing: Bed level Lower Body Bathing: Dependent Where Assessed-Lower Body Bathing: Bed level Upper Body Dressing: Not assessed (No clothing available) Lower Body Dressing: Not assessed Toileting: Dependent Toilet Transfer: Not assessed Toilet Transfer Method: Not assessed Tub/Shower Transfer: Not assessed  Vision/Perception    Eye Alignment: Within Functional Limits Perception: Within Functional Limits   Cognition Overall Cognitive Status: Appears within functional limits for tasks assessed Arousal/Alertness: Awake/alert Orientation Level: Oriented X4 Attention: Selective Selective Attention: Appears intact Memory: Appears intact Awareness: Appears intact Problem Solving: Impaired due to language barrier Problem Solving Impairment: Verbal basic Safety/Judgment: Impaired due to language barrier Comments: Assessment  ongoing due to languager barrier  Sensation Light Touch Appears intact RUE and LUE Stereognosis: Appears Intact RUE and LUE Proprioception: Appears Intact RUE and LUE  Motor  Motor: Paraplegia  Mobility  Bed Mobility: Yes Rolling Right: 2: Max assist;With rail Rolling Right Details: Tactile cues for initiation;Verbal cues for sequencing;Verbal cues for safe use of DME/AE;Manual facilitation for weight shifting;Manual facilitation for placement Rolling Left: 2: Max assist;With rail Rolling Left Details: Tactile cues for initiation;Verbal cues for sequencing;Verbal cues for safe use of DME/AE;Manual facilitation for weight shifting;Manual facilitation for placement Supine to Sit: 1: +2 Total assist Supine to Sit Details (indicate cue type and reason): B LE's requiring Total assist, patient able to give minimal help with UE's to push up to sitting and this with max-A and max verbal cues  Scooting to [facility]: 1: +2 Total assist Scooting to [facility] Details (indicate cue type and reason): Drawsheet assist x 2 to bring patient to [facility] Transfers Transfers: Yes   Trunk/Postural Assessment  Cervical Assessment Cervical Assessment: Within Functional Limits Postural Control Postural Control: Deficits on evaluation (TLSO brace on while in supine, B UE on bed with added min-A)   Balance Balance Balance Assessed: Yes Static Sitting Balance Static Sitting - Balance Support: Bilateral upper extremity  supported;Feet supported (required additional min-A to maintaint static sitting at EOB) Static Sitting - Level of Assistance: 4: Min assist  Upper Extremity Assessment: AROM WFL, UE strength 4/5 at R-UE and 3+/5 at L-UE  Recommendations for other services: Interpreter, social work service.  Discharge Criteria: Patient will be discharged from OT if patient refuses treatment 3 consecutive times without medical reason, if treatment goals not met, if there is a change in medical status, if patient makes no progress towards goals or if patient is discharged from hospital.  The above assessment was discussed and related to patient and spouse who verbalized only partial understanding due to language barrier.  Georgeanne Nim 11/21/2011, 5:58 PM

## 2011-11-22 LAB — TYPE AND SCREEN

## 2011-11-22 LAB — GLUCOSE, CAPILLARY: Glucose-Capillary: 205 mg/dL — ABNORMAL HIGH (ref 70–99)

## 2011-11-22 MED ORDER — METFORMIN HCL 500 MG PO TABS
500.0000 mg | ORAL_TABLET | Freq: Two times a day (BID) | ORAL | Status: DC
  Administered 2011-11-22 – 2011-11-27 (×12): 500 mg via ORAL
  Filled 2011-11-22 (×16): qty 1

## 2011-11-22 MED ORDER — SENNA 8.6 MG PO TABS
2.0000 | ORAL_TABLET | Freq: Two times a day (BID) | ORAL | Status: DC
  Administered 2011-11-22 – 2011-11-24 (×5): 17.2 mg via ORAL
  Filled 2011-11-22 (×6): qty 2

## 2011-11-22 MED ORDER — INSULIN GLARGINE 100 UNIT/ML ~~LOC~~ SOLN
15.0000 [IU] | Freq: Every day | SUBCUTANEOUS | Status: DC
  Administered 2011-11-22 – 2011-11-23 (×2): 15 [IU] via SUBCUTANEOUS

## 2011-11-22 NOTE — Progress Notes (Signed)
Occupational Therapy Session Note  Patient Details  Name: John Meza MRN: [id number] Date of Birth: 06/16/1961  Today's Date: 11/22/2011 Time: 1500-1605 Time Calculation (min): 65 min  Precautions: Precautions Precautions: Back;Fall Precaution Booklet Issued: No Precaution Comments:  (0/5 B LE MMT) Required Braces or Orthoses: Yes Spinal Brace: Thoracolumbosacral orthotic Restrictions Weight Bearing Restrictions: No  Short Term Goals: OT Short Term Goal 1: Patient will complete upper body bathing and dressing at sink side, seated in w/c, with supervision assist OT Short Term Goal 2: Patient will demo ability to don/doff TLSO with min assist OT Short Term Goal 3: Patient will complete grooming and hygeine seated at sink side with setup assist. OT Short Term Goal 4: Patient will demo ability to perform bed mobility using DME and AE as needed with min assist. OT Short Term Goal 5: Patient will complete sliding board transfer from bed to w/c and back with moderate assist.  Skilled Therapeutic Interventions/Progress Updates:    Addressed bed mobility for donning binder, TLSO, Ted stockings.  Wife present.  Educated her on application of items and also to tighten the TLSO straps from the bottom to the top.  Had Spanish interpretor for communication.  Addressed sitting balance on EOB and lateral leaning to elbows.  Performed sliding board transfer with total assist, pt =10 %.  Pt. Able to do wc push up with moderate assist to adjust position.  Provided gestures for pt to use arms as much as possible.     Pain"  010 without movement; 5/10 with movement  Therapy/Group: Individual Therapy  Humberto Seals 11/22/2011, 5:42 PM

## 2011-11-22 NOTE — Progress Notes (Signed)
Patient ID: John Meza, male   DOB: [birth year], 51 y.o.   MRN: [id number] Subjective/Complaints: No BM after miralax, sorbitol and dulcolax supp Review of Systems  Unable to perform ROS: language   Objective: Vital Signs: Blood pressure 119/75, pulse 93, temperature 99.3 F (37.4 C), temperature source Oral, resp. rate 16, SpO2 97.00%. No results found. Results for orders placed during the hospital encounter of 11/20/11 (from the past 72 hour(s))  GLUCOSE, CAPILLARY     Status: Abnormal   Collection Time   11/20/11  9:08 PM      Component Value Range Comment   Glucose-Capillary 262 (*) 70 - 99 (mg/dL)    Comment 1 Notify RN     GLUCOSE, CAPILLARY     Status: Abnormal   Collection Time   11/21/11  7:41 AM      Component Value Range Comment   Glucose-Capillary 215 (*) 70 - 99 (mg/dL)   GLUCOSE, CAPILLARY     Status: Abnormal   Collection Time   11/21/11 11:26 AM      Component Value Range Comment   Glucose-Capillary 243 (*) 70 - 99 (mg/dL)    Comment 1 Notify RN     GLUCOSE, CAPILLARY     Status: Abnormal   Collection Time   11/21/11  4:53 PM      Component Value Range Comment   Glucose-Capillary 231 (*) 70 - 99 (mg/dL)   GLUCOSE, CAPILLARY     Status: Abnormal   Collection Time   11/21/11  8:44 PM      Component Value Range Comment   Glucose-Capillary 255 (*) 70 - 99 (mg/dL)    Comment 1 Notify RN      Physical Exam  Nursing note and vitals reviewed.  Constitutional: He is oriented to person, place, and time. He appears well-developed and well-nourished.  HENT:  Head: Normocephalic.  Eyes: Pupils are equal, round, and reactive to light.  Neck: Normal range of motion. Neck supple.  Cardiovascular: Normal rate.  Pulmonary/Chest:  Decreased breath sounds RLL. Dyspnea with deep inspiration.  Abdominal: Soft. Bowel sounds are normal. He exhibits no distension. There is no tenderness.  Musculoskeletal: He exhibits no edema.  Neurological: He is alert and oriented to person, place,  and time. He displays abnormal reflex.   He has 0 out of 5 strength in both lower extremities. Areflexic-BLE. Follows commands  Upper extremity motor  intact.  There is a language barrier.  Skin: Skin is warm and dry.  Psychiatric: He has a normal mood and affect. His behavior is normal.  Ext:  No swelling or edema in BLE, no discoloration   Assessment/Plan: 1. 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 Physiatrist is providing close team supervision and 24 hour management of active medical problems listed below. Physiatrist and rehab team continue to assess barriers to discharge/monitor patient progress toward functional and medical goals. FIM: FIM - Bathing Bathing Steps Patient Completed: Chest;Right Arm;Left Arm;Abdomen;Front perineal area Bathing: 3: Mod-Patient completes 5-7 78f 10 parts or 50-74%  FIM - Upper Body Dressing/Undressing Upper body dressing/undressing: 0: Wears gown/pajamas-no public clothing FIM - Lower Body Dressing/Undressing Lower body dressing/undressing: 0: Wears gown/pajamas-no public clothing  FIM - Toileting Toileting: 0: Activity did not occur  FIM - Archivist Transfers: 0-Activity did not occur  FIM - Banker Devices: Sliding board;Orthosis (TLSO)  Bed/Chair Transfer: 1: Supine > Sit: Total A (helper does all/Pt. < 25%);1: Sit > Supine: Total A (helper does all/Pt. < 25%);1: Bed > Chair or W/C: Total A (helper does all/Pt. < 25%);1: Chair or W/C > Bed: Total A (helper does all/Pt. < 25%) (Nursing to use mechanical lift)  FIM - Locomotion: Wheelchair Locomotion: Wheelchair: 0: Activity did not occur FIM - Locomotion: Ambulation Locomotion: Ambulation Assistive Devices: Other (comment) (currently paraplegic) Locomotion: Ambulation: 0: Activity did  not occur  Comprehension Comprehension Mode: Auditory Comprehension: 5-Follows basic conversation/direction: With no assist  Expression Expression Mode: Verbal Expression Assistive Devices: 6-Other (Comment) Expression: 5-Expresses basic 90% of the time/requires cueing < 10% of the time.  Social Interaction Social Interaction: 5-Interacts appropriately 90% of the time - Needs monitoring or encouragement for participation or interaction.  Problem Solving Problem Solving: 5-Solves basic 90% of the time/requires cueing < 10% of the time  Memory Memory: 6-More than reasonable amt of time  2. Anticoagulation/DVT prophylaxis with Pharmaceutical: No lovenox yet continue SCD 3. Pain Management: oxycodone  Medical Problem List and Plan:  1. 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 DVT Prophylaxis/Anticoagulation: SCDs. Will discuss subcutaneous Lovenox. Monitor for any signs of deep vein thrombosis. Needs Doppler study  3. Pain management. Lyrica 75 mg twice daily, oxycodone as needed. Monitor with increased activity. Patient complains generally of low back pain and denies dysesthesias in the lower extremities.  4. Neurogenic bowel and bladder. Foley catheter tube currently in place. Initiate bowel program Provide patient and family education  5. Diabetes mellitus. Hemoglobin A1c of 10.2. Currently with Lantus insulin 10 units at bedtime will increase to 15 unit. Patient on oral agents of Actos 45 mg daily prior to admission as well as Glucophage 1000 mg daily. Check blood sugars a.c. and at bedtime. Provide diabetic teaching. Needs excellent sugar control to optimize wound healing Will restart metformin 6. Hyperlipidemia. Crestor  LOS (Days) 2 A FACE TO FACE EVALUATION WAS PERFORMED  John Meza E 11/22/2011, 6:50 AM

## 2011-11-22 NOTE — Progress Notes (Signed)
Patient given soap suds enema this morning instead of the suppository. Bowel sounds still hypoactive. Patient had small incontinent stool after dig stimulation. Patient foley intact. Dr Wynn Banker notified. No new orders received. Dulcolax tablet given at 1440 wit no results at this time. Patient back dressing clean dry and intact. Patient wearing TLSO brace OOB- transfers with maxi lift. Patient given 20mg  oxy IR for pain this evening. Patient blood sugars still running high. Patient sitting up in chair for a couple hours this shift. No lightheadedness reported.

## 2011-11-22 NOTE — Plan of Care (Signed)
Problem: RH SAFETY Goal: RH STG ADHERE TO SAFETY PRECAUTIONS W/ASSISTANCE/DEVICE STG Adhere to Safety Precautions With Assistance/Device.  Outcome: Progressing Patient wife stays with patient and also makes patient needs known

## 2011-11-22 NOTE — Plan of Care (Signed)
Problem: SCI BOWEL ELIMINATION Goal: RH STG MANAGE BOWEL WITH ASSISTANCE STG Manage Bowel with Assistance.  Outcome: Progressing Very small bowel movement after SSE this morning  Problem: RH SKIN INTEGRITY Goal: RH STG ABLE TO PERFORM INCISION/WOUND CARE W/ASSISTANCE STG Able To Perform Incision/Wound Care With Assistance.  Outcome: Not Progressing Total assist at this time

## 2011-11-23 ENCOUNTER — Inpatient Hospital Stay (HOSPITAL_COMMUNITY): Payer: Worker's Compensation

## 2011-11-23 DIAGNOSIS — K592 Neurogenic bowel, not elsewhere classified: Secondary | ICD-10-CM

## 2011-11-23 DIAGNOSIS — Z5189 Encounter for other specified aftercare: Secondary | ICD-10-CM

## 2011-11-23 DIAGNOSIS — W11XXXA Fall on and from ladder, initial encounter: Secondary | ICD-10-CM

## 2011-11-23 DIAGNOSIS — IMO0002 Reserved for concepts with insufficient information to code with codable children: Secondary | ICD-10-CM

## 2011-11-23 DIAGNOSIS — N319 Neuromuscular dysfunction of bladder, unspecified: Secondary | ICD-10-CM

## 2011-11-23 LAB — GLUCOSE, CAPILLARY
Glucose-Capillary: 193 mg/dL — ABNORMAL HIGH (ref 70–99)
Glucose-Capillary: 244 mg/dL — ABNORMAL HIGH (ref 70–99)
Glucose-Capillary: 224 mg/dL — ABNORMAL HIGH (ref 70–99)
Glucose-Capillary: 283 mg/dL — ABNORMAL HIGH (ref 70–99)

## 2011-11-23 MED ORDER — POLYETHYLENE GLYCOL 3350 17 G PO PACK
17.0000 g | PACK | Freq: Once | ORAL | Status: AC
  Administered 2011-11-23: 17 g via ORAL
  Filled 2011-11-23: qty 1

## 2011-11-23 NOTE — Progress Notes (Addendum)
[facility] Individual Statement of Services  Patient Name:  John Meza  Date:  11/23/2011  Welcome to the [facility].  Our goal is to provide you with an individualized program based on your diagnosis and situation, designed to meet your specific needs.  With this comprehensive rehabilitation program, you will be expected to participate in at least 3 hours of rehabilitation therapies Monday-Friday, with modified therapy programming on the weekends.  Your rehabilitation program will include the following services:  Physical Therapy (PT), Occupational Therapy (OT), 24 hour per day rehabilitation nursing, Therapeutic Recreaction (TR), Case Management (RN and Child psychotherapist), Rehabilitation Medicine, Nutrition Services and Pharmacy Services  Weekly team conferences will be held on  Tuesday  to discuss your progress.  Your RN Case Designer, television/film set will talk with you frequently to get your input and to update you on team discussions.  Team conferences with you and your family in attendance may also be held.  Estimated Length of Stay:  About 3 weeks                          Goals:  Min-Mod Assist  24/7  Depending on your progress and recovery, your program may change.  Your RN Case Estate agent will coordinate services and will keep you informed of any changes.  Your RN Sports coach and SW names and contact numbers are listed  below.  The following services may also be recommended but are not provided by the [facility]:   Driving Evaluations  Home Health Rehabiltiation Services  Outpatient Rehabilitatation [facility]  Vocational Rehabilitation   Arrangements will be made to provide these services after discharge if needed.  Arrangements include referral to agencies that provide these services.  Your insurance has been verified to be:  Workers Comp Your primary doctor is:  Starleen Blue [facility]    [phone]  Pertinent information will be shared with your doctor and your insurance company.  Case Manager: Melanee Spry, [facility] [phone]  Social Worker:  Amada [place], [place] [phone]  Information discussed with pt & wife & copy given to patient by: Brock Ra, 11/23/2011, 11:01 AM

## 2011-11-23 NOTE — Progress Notes (Signed)
Patient Details  Name: [name] MRN: [id number] Date of Birth: 1961-04-18  Today's Date: 11/23/2011 Time: 1020-1055  Skilled Therapeutic Interventions/Progress Updates: Discussed leisure interest with pt, pt's wife and pt's daughter while seated EOB.  Treatment session focused on bed mobility, static & dynamic sitting balance, trunk control, and transfer training. Bed mobility to apply TLSO brace and abdominal binder (mod A using bedrails to R and L). Pt's wife present during session and assists as able. Pt sat EOB reaching outside BOS and anterior shifts and lateral leans alternating between one upper extremity support and no upper extremity support. Pt with increased difficulty with transition of movements and tends to lose balance to the R. Practiced slide board transfer to w/c and back to bed with max A + 2, manual facilitation for weightshift and technique. Scooting EOB with max A + 2 to the left. Pt reports being able to tell where he needs to adjust to in w/c for positioning but unable to do so without assistance. Therapist changed cushion to pressure relieving cushion due to impaired sensation, initiated education to family on reasoning for pressure relief. Needs to be addressed further and demonstrate boosting to family as well.   No c/o pain.   Therapy/Group: Other: co-treat with PT   Metha Kolasa 11/23/2011, 3:38 PM

## 2011-11-23 NOTE — Progress Notes (Signed)
Patient ID: John Meza, male   DOB: 05/28/1961, 51 y.o.   MRN: [id number] Patient ID: John Meza, male   DOB: 18-Aug-1961, 51 y.o.   MRN: [id number] Subjective/Complaints: minmal results with SSE. mild belly pain. slept well---/11Review of Systems  Unable to perform ROS: language   Objective: Vital Signs: Blood pressure 112/71, pulse 77, temperature 99 F (37.2 C), temperature source Oral, resp. rate 18, SpO2 98.00%. No results found. Results for orders placed during the hospital encounter of 11/20/11 (from the past 72 hour(s))  GLUCOSE, CAPILLARY     Status: Abnormal   Collection Time   11/20/11  9:08 PM      Component Value Range Comment   Glucose-Capillary 262 (*) 70 - 99 (mg/dL)    Comment 1 Notify RN     GLUCOSE, CAPILLARY     Status: Abnormal   Collection Time   11/21/11  7:41 AM      Component Value Range Comment   Glucose-Capillary 215 (*) 70 - 99 (mg/dL)   GLUCOSE, CAPILLARY     Status: Abnormal   Collection Time   11/21/11 11:26 AM      Component Value Range Comment   Glucose-Capillary 243 (*) 70 - 99 (mg/dL)    Comment 1 Notify RN     GLUCOSE, CAPILLARY     Status: Abnormal   Collection Time   11/21/11  4:53 PM      Component Value Range Comment   Glucose-Capillary 231 (*) 70 - 99 (mg/dL)   GLUCOSE, CAPILLARY     Status: Abnormal   Collection Time   11/21/11  8:44 PM      Component Value Range Comment   Glucose-Capillary 255 (*) 70 - 99 (mg/dL)    Comment 1 Notify RN     GLUCOSE, CAPILLARY     Status: Abnormal   Collection Time   11/22/11  7:16 AM      Component Value Range Comment   Glucose-Capillary 205 (*) 70 - 99 (mg/dL)   GLUCOSE, CAPILLARY     Status: Abnormal   Collection Time   11/22/11 11:50 AM      Component Value Range Comment   Glucose-Capillary 276 (*) 70 - 99 (mg/dL)   GLUCOSE, CAPILLARY     Status: Abnormal   Collection Time   11/22/11  4:14 PM      Component Value Range Comment   Glucose-Capillary 246 (*) 70 - 99 (mg/dL)   GLUCOSE, CAPILLARY      Status: Abnormal   Collection Time   11/22/11  9:37 PM      Component Value Range Comment   Glucose-Capillary 310 (*) 70 - 99 (mg/dL)    Physical Exam  Nursing note and vitals reviewed.  Constitutional: He is oriented to person, place, and time. He appears well-developed and well-nourished.  HENT:  Head: Normocephalic.  Eyes: Pupils are equal, round, and reactive to light.  Neck: Normal range of motion. Neck supple.  Cardiovascular: Normal rate.  Pulmonary/Chest:  Decreased breath sounds RLL. Dyspnea with deep inspiration.  Abdominal: Soft. Bowel sounds are normal. He exhibits no distension. There is no tenderness.  Musculoskeletal: He exhibits no edema.  Neurological: He is alert and oriented to person, place, and time. He displays abnormal reflex.   He has 0 out of 5 strength in both lower extremities. Areflexic-BLE. Follows commands  Upper extremity motor  intact.  There is a language barrier. No sensation below the level of injury Skin: Skin is warm and dry.  Psychiatric:  He has a normal mood and affect. His behavior is normal.  Ext:  No swelling or edema in BLE, no discoloration 2/11  Assessment/Plan: 1. 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 Physiatrist is providing close team supervision and 24 hour management of active medical problems listed below. Physiatrist and rehab team continue to assess barriers to discharge/monitor patient progress toward functional and medical goals. FIM: FIM - Bathing Bathing Steps Patient Completed: Chest;Right Arm;Left Arm;Abdomen;Front perineal area Bathing: 3: Mod-Patient completes 5-7 46f 10 parts or 50-74%  FIM - Upper Body Dressing/Undressing Upper body dressing/undressing: 0: Wears gown/pajamas-no public clothing FIM - Lower Body Dressing/Undressing Lower body  dressing/undressing: 0: Wears gown/pajamas-no public clothing  FIM - Toileting Toileting: 0: Activity did not occur  FIM - Archivist Transfers: 0-Activity did not occur  FIM - Banker Devices: Sliding board Bed/Chair Transfer: 2: Supine > Sit: Max A (lifting assist/Pt. 25-49%);1: Bed > Chair or W/C: Total A (helper does all/Pt. < 25%);1: Two helpers  FIM - Locomotion: Wheelchair Locomotion: Wheelchair: 0: Activity did not occur FIM - Locomotion: Ambulation Locomotion: Ambulation Assistive Devices: Other (comment) (currently paraplegic) Locomotion: Ambulation: 0: Activity did not occur  Comprehension Comprehension Mode: Auditory Comprehension: 5-Follows basic conversation/direction: With no assist  Expression Expression Mode: Verbal Expression Assistive Devices: 6-Other (Comment) Expression: 5-Expresses basic needs/ideas: With extra time/assistive device  Social Interaction Social Interaction: 5-Interacts appropriately 90% of the time - Needs monitoring or encouragement for participation or interaction.  Problem Solving Problem Solving: 5-Solves basic problems: With no assist  Memory Memory: 6-More than reasonable amt of time  2. Anticoagulation/DVT prophylaxis with Pharmaceutical: No lovenox yet continue SCD 3. Pain Management: oxycodone  Medical Problem List and Plan:  1. 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 DVT Prophylaxis/Anticoagulation: SCDs. Will discuss subcutaneous Lovenox. Monitor for any signs of deep vein thrombosis. Needs Doppler study still.  3. Pain management. Lyrica 75 mg twice daily, oxycodone as needed. Monitor with increased activity. Patient complains generally of low back pain and denies dysesthesias in the lower extremities.  4. Neurogenic bowel and bladder. Foley catheter tube currently in place. CheckKUB today. Repeat SSE if needed.  5. Diabetes mellitus. Hemoglobin A1c  of 10.2. Needs further titration of lantus. Patient on oral agents of Actos 45 mg daily prior to admission as well as Glucophage 1000 mg daily. Check blood sugars a.c. and at bedtime. Provide diabetic teaching. Needs excellent sugar control to optimize wound healing Will restart metformin 6. Hyperlipidemia. Crestor  LOS (Days) 3 A FACE TO FACE EVALUATION WAS PERFORMED  SWARTZ,ZACHARY T 11/23/2011, 6:53 AM

## 2011-11-23 NOTE — Progress Notes (Signed)
Patient information reviewed and entered into [facility] system by Demarie Uhlig, RN, CRRN, PPS Coordinator.  Information including medical coding and functional independence measure will be reviewed and updated through discharge.    

## 2011-11-23 NOTE — Progress Notes (Signed)
Physical Therapy Note  Patient Details  Name: Christphor Groft MRN: [id number] Date of Birth: 04-15-1961 Today's Date: 11/23/2011  15:30 -16:00 individual therapy pt unable to rate pain due to language barrier. Performed bil LE ROM all planes in bed. Pt missed 30 minutes of therapy for meeting with lawyers.   Julian Reil 11/23/2011, 4:32 PM

## 2011-11-23 NOTE — Progress Notes (Signed)
Brief Nutrition Note  RD drawn to chart 2/2 to [place]. This RD spoke with patient's family regarding patient's nutrition hx. Per family, pt has had no changes in appetite, eating well at this time. Pt denies recent unintentional wt loss. Spoke with family regarding food choices.   Current wt at 180 lb. BMI is 28.2. Pt meets criteria for overweight.  Consult RD for any nutrition issues.  Adair Laundry Pager#: [phone]

## 2011-11-23 NOTE — Progress Notes (Signed)
Occupational Therapy Session Note  Patient Details  Name: Arsalan Brisbin MRN: [id number] Date of Birth: 22-Apr-1961  Today's Date: 11/23/2011 Time: 4782-9562 Time Calculation (min): 55 min  Precautions: Precautions Precautions: Back;Fall Precaution Booklet Issued: No Precaution Comments:  (0/5 B LE MMT) Required Braces or Orthoses: Yes Spinal Brace: Thoracolumbosacral orthotic Restrictions Weight Bearing Restrictions: No  Short Term Goals: OT Short Term Goal 1: Patient will complete upper body bathing and dressing at sink side, seated in w/c, with supervision assist OT Short Term Goal 2: Patient will demo ability to don/doff TLSO with min assist OT Short Term Goal 3: Patient will complete grooming and hygeine seated at sink side with setup assist. OT Short Term Goal 4: Patient will demo ability to perform bed mobility using DME and AE as needed with min assist. OT Short Term Goal 5: Patient will complete sliding board transfer from bed to w/c and back with moderate assist.  Skilled Therapeutic Interventions/Progress Updates:  Pt seen in room. Pt's wife present during session. Engaged in EOB balance activities. Pt sitting hand/hand with therapist in front of him working on finding and holding balance. Therapist gave tactile cues through hand movements when pt was losing balance. Toward end of session pt able to sit unsupported for brief period. Challenged pt further by having him reach for objects with minA. Focus on activity tolerance and static/dynamic sitting balance.   Pain Pain Assessment Pain Assessment: 0-10 Pain Score:   4 Pain Type: Surgical pain Pain Location: Back  Therapy/Group: Individual Therapy  Loleta Chance, OTAS 11/23/2011, 2:35 PM

## 2011-11-23 NOTE — Progress Notes (Signed)
Physical Therapy Session Note  Patient Details  Name: John Meza MRN: [id number] Date of Birth: 1960-12-22  Today's Date: 11/23/2011 Time: 1000-1055 Time Calculation (min): 55 min  Precautions: Precautions Precautions: Back;Fall Precaution Booklet Issued: No Precaution Comments:  (0/5 B LE MMT) Required Braces or Orthoses: Yes Spinal Brace: Thoracolumbosacral orthotic Restrictions Weight Bearing Restrictions: No  Short Term Goals: PT Short Term Goal 1: Patient will be able to perform Bed Mobility with Max-A PT Short Term Goal 2: Patient will be able to perform Slideboard transfers with Max-A x 2 PT Short Term Goal 3: Patient will be able to perform manual w/c mobility x 200' with S/Independence PT Short Term Goal 4: Patient will be able to maintain static sitting balance x 5 minutes with S/Independence   Pain  Denies pain and denies dizziness with mobility.  Treatment session focused on bed mobility, neuro reed for balance and trunk control, and transfer training. Bed mobility to apply TLSO brace and abdominal binder (mod A using bedrails to R and L). Pt's wife present during session and assists as able. Worked on technique for supine to sit using rails with max A + 2, cueing for technique due to back precautions. Dynamic sitting balance EOB for neuro reeducation to trunk and balance reaching outside BOS and anterior shifts and lateral leans alternating between one upper extremity support and no upper extremity support. Pt with increased difficulty with transition of movements and tends to lose balance to the R. Practiced slide board transfer to w/c and back to bed with max A + 2, manual facilitation for weightshift and technique. Scooting EOB with max A + 2 to the left. Pt reports being able to tell where he needs to adjust to in w/c for positioning but unable to do so without assistance. Therapist changed cushion to pressure relieving cushion due to impaired sensation, initiated  education to family on reasoning for pressure relief. Needs to be addressed further and demonstrate boosting to family as well.      Therapy/Group: Individual Therapy and Co-Treatment with recreational therapy  Karolee Stamps [facility] 11/23/2011, 12:12 PM

## 2011-11-23 NOTE — Progress Notes (Signed)
Recreational Therapy Assessment and Plan  Patient Details  Name: John Meza MRN: [id number] Date of Birth: 10-Jun-1961  Rehab Potential: Good ELOS: 3+ weeks   Assessment Clinical Impression: Patient is a 51 y.o. year old male with recent admission to the hospital on 11/16/11 with SCI who was at work on a ladder when he lost his balance and fell 10 feet with +LOC briefly. Patient with inability to move BLE with numbness and pain posterior head. X- rays in ED with Markedly comminuted T11 fracture with Retropulsion and anterior tanslation of spinal canal with near obliteration of central canal as well his left lumbar L1 and lumbar L2 transverse process fractures. Patient underwent T9-L1 posterior lateral arthrodesis with bone graft on 02/06 pm.  Post op with hypoxia requiring BIPAP. Latest followup chest x-ray was improved by basilar atelectasis. TLSO when out of bed must be applied in the supine position. Foley catheter tube remains in place. SCDs in place for deep vein thrombosis prophylaxis. Physical medicine and rehabilitation consulted for plan for inpatient rehabilitation services.  Patient transferred to CIR on 11/20/2011 .   Pt presents with decreased activity tolerance, decreased functional mobility, decreased balance, paraplegia limiting pt's independence with leisure/community pursuits.  Recreational Therapy Leisure History/Participation Premorbid leisure interest/current participation: Sports - Exercise (Comment);[place] - Fishing;Community - Grocery store;Community - Engineer, civil (consulting) (rding bikes, visit park with kids) Leisure Participation Style: With Family/Friends Community Reintegration Appropriate for Education?: Yes Social interaction - Mood/Behavior: Cooperative Strengths/Weaknesses Patient Strengths/Abilities: Willingness to participate;Active premorbidly Patient weaknesses: Physical limitations  Plan Rec Therapy Plan Is patient appropriate for Therapeutic Recreation?: Yes Rehab  Potential: Good Treatment times per week: Min 1 time per week >20 minutes Estimated Length of Stay: 3+ weeks Therapy Goals Achieved By:: Recreation/leisure participation;Patient/family education;Community reintegration/education;1:1 session;Adaptive equipment instruction  Recommendations for other services: None  Discharge Criteria: Patient will be discharged from TR if patient refuses treatment 3 consecutive times without medical reason.  If treatment goals not met, if there is a change in medical status, if patient makes no progress towards goals or if patient is discharged from hospital.  The above assessment, treatment plan, treatment alternatives and goals were discussed and mutually agreed upon: by patient  Brian Zeitlin 11/23/2011, 12:53 PM

## 2011-11-23 NOTE — Progress Notes (Signed)
Occupational Therapy Session Note  Patient Details  Name: John Meza MRN: [id number] Date of Birth: 11-12-1960  Today's Date: 11/23/2011 Time: 1100-1200 Time Calculation (min): 60 min  Precautions:  Precautions: Back;Fall Precaution Booklet Issued: No Precaution Comments:  (0/5 B LE MMT) Required Braces or Orthoses: Yes Spinal Brace: Thoracolumbosacral orthotic Weight Bearing Restrictions: No  Short Term Goals: OT Short Term Goal 1: Patient will complete upper body bathing and dressing at sink side, seated in w/c, with supervision assist OT Short Term Goal 2: Patient will demo ability to don/doff TLSO with min assist OT Short Term Goal 3: Patient will complete grooming and hygeine seated at sink side with setup assist. OT Short Term Goal 4: Patient will demo ability to perform bed mobility using DME and AE as needed with min assist. OT Short Term Goal 5: Patient will complete sliding board transfer from bed to w/c and back with moderate assist.  Skilled Therapeutic Interventions:  Self care retraining to include bath and dressing.  Patient's wife and daughter, Jadene Pierini, present during session.  Wife assisted during ADL while daughter sat behind curtain to provide interpretation.  Patient's pain level 9/10 therefore not able to fully participate in session.  Focus session on patient completing as much of the task of bathing and dressing as possible in supine, roll, and with HOB up (no more than 45 degrees without TLSO on), and focus on patient /caregiver education.  At beginning of session, patient's wife fully intended to perform 100% of patient's bath and dressing.  By the end of the session patient and wife began to understand rehab philosophy of patient gaining independence.  Patient's wife agreed to stop feeding her husband; realizing that patient needed to start feeding himself.  Pain Patient reports 9/10 in back at incision area, RN provided medication prior to OT session.  Pain  limited patient's ability to fully participate in therapy.  Therapy/Group: Individual Therapy  Kellie Chisolm 11/23/2011, 1:21 PM

## 2011-11-23 NOTE — Progress Notes (Signed)
Note reviewed and accurately reflects treatment session.   

## 2011-11-24 LAB — GLUCOSE, CAPILLARY
Glucose-Capillary: 223 mg/dL — ABNORMAL HIGH (ref 70–99)
Glucose-Capillary: 229 mg/dL — ABNORMAL HIGH (ref 70–99)
Glucose-Capillary: 288 mg/dL — ABNORMAL HIGH (ref 70–99)

## 2011-11-24 MED ORDER — SENNOSIDES-DOCUSATE SODIUM 8.6-50 MG PO TABS
2.0000 | ORAL_TABLET | Freq: Every day | ORAL | Status: DC
  Administered 2011-11-24 – 2011-11-26 (×3): 2 via ORAL
  Filled 2011-11-24 (×3): qty 2

## 2011-11-24 MED ORDER — INSULIN GLARGINE 100 UNIT/ML ~~LOC~~ SOLN
25.0000 [IU] | Freq: Every day | SUBCUTANEOUS | Status: DC
  Administered 2011-11-24 – 2011-11-25 (×2): 25 [IU] via SUBCUTANEOUS

## 2011-11-24 MED ORDER — POLYETHYLENE GLYCOL 3350 17 G PO PACK
17.0000 g | PACK | Freq: Two times a day (BID) | ORAL | Status: DC
  Administered 2011-11-24 – 2011-11-27 (×7): 17 g via ORAL
  Filled 2011-11-24 (×9): qty 1

## 2011-11-24 MED ORDER — WHITE PETROLATUM GEL
Status: AC
  Filled 2011-11-24: qty 5

## 2011-11-24 NOTE — Progress Notes (Signed)
Pt. Received suppository this am at 0800, followed by dig stim 30 min's; no stool felt in rectum; repeated 30 min's later, no results; KUB done per orders; results to Harvel Ricks, PAC; SSE  Given at 1800, no results; Pt c/o abd. tenderness; no nausea/vomiting. Marissa Nestle, PAC aware, orders received . Report to evening shift. Carlean Purl

## 2011-11-24 NOTE — Progress Notes (Signed)
Inpatient Diabetes Program Recommendations  AACE/ADA: New Consensus Statement on Inpatient Glycemic Control (2009)  Target Ranges:  Prepandial:   less than 140 mg/dL      Peak postprandial:   less than 180 mg/dL (1-2 hours)      Critically ill patients:  140 - 180 mg/dL   Reason for Visit: Post-prandial as well as fasting hyperglycemia  Inpatient Diabetes Program Recommendations Insulin - Basal: Noted increase to 25 units, thank you Insulin - Meal Coverage: Please add 3-4 units meal coverage.  Pt receiving high doses correction before meals with glucose remaining in 200's.  Note: Thank you, Lenor Coffin, RN, CNS, Diabetes Coordinator [phone])

## 2011-11-24 NOTE — Progress Notes (Signed)
Per report pt given suppository as bowel program 0800 as well as soap suds enema in afternoon. Two doses of miralaxx given per order with no BM as a result. Last BM 2-10 which was small per report. Pt distended with tender abdomen. Cont. Bowel program QAM.  Cont to monitor pt. -Kennon Portela, RN

## 2011-11-24 NOTE — Progress Notes (Signed)
Occupational Therapy Session Note  Patient Details  Name: Hope Holst MRN: [id number] Date of Birth: Mar 16, 1961  Today's Date: 11/24/2011 Time: 1000-1055 Time Calculation (min): 55 min   Skilled Therapeutic Interventions/Progress Updates:  ADL retraining in room. Pt's wife and interpretor present during session. Bathing supine in bed with HOB elevated. Pt able to bathe UB and perineal area. Pt rolled side to side with maxA from therapist. Pt able to reach with UE to minimally assist with roll. Attempted LB bathing and pt able to bathe upper part of leg. After bath pt sat EOB (maxA sup/sit) for balance activities. Pt hand/hand with therapist in front of him continuing to work on Company secretary. Focus on activity tolerance, bed mobility, and sitting balance.    Therapy Documentation Precautions:  Precautions Precautions: Back;Fall Precaution Booklet Issued: No Precaution Comments:  (0/5 B LE MMT) Required Braces or Orthoses: Yes Spinal Brace: Thoracolumbosacral orthotic Restrictions Weight Bearing Restrictions: No  Pain: Pain Assessment Pain Assessment: No/denies pain  See FIM for current functional status  Therapy/Group: Individual Therapy  Loleta Chance, OTAS 11/24/2011, 12:19 PM

## 2011-11-24 NOTE — Progress Notes (Signed)
Occupational Therapy Session Note  Patient Details  Name: John Meza MRN: [id number] Date of Birth: Dec 07, 1960  Today's Date: 11/24/2011  Precautions:  Precautions: Back;Fall Precaution Booklet Issued: No Precaution Comments:  (0/5 B LE MMT) Required Braces or Orthoses: Yes Spinal Brace: Thoracolumbosacral orthotic Weight Bearing Restrictions: No  Session 1: Time: 7846-9629 (50 min cotreat with OT) Time Calculation (min): 28 min  Pain: No c/o pain  Skilled Therapeutic Interventions:  Research officer, trade union and patient's wife present and active in therapy session.  Session included education and practice of bed mobility, donn TLSO,weight shift off hip to then scoot hip forward, static and dynamic sitting balance.  Focus session on step by step education for bed mobility with emphasis on patient learning his responsibility to ask caregiver to bend his right knee before he begins rolling to the left and visa versa for rolling to right, and the concept of shifting weight off of a hip with a lateral lean to make scooting the hip easier.  Also, step by step education to donn TLSO and teaching patient techniques to rely on himself to recover as he begins to loose his balance to include performing grooming task of brush teeth seated EOB.  Therapy/Group: Co-Treatment   Session 2: Time: 1300-1415, 75 minutes  Skilled Therapeutic Interventions:  W/C mobility, W/C>drop arm commode>bed sliding board transfers, toileting, static and dynamic sitting balance.  Focus session on patient and wife education with safe transfers, lateral leans for pants down and bed pad placement under patient before transfer back to bed, dynamic sitting balance, lateral leans with close supervision.  While on bedside commode, nursing attempted to help with bowel movement with digital stimulation.  Pain Assessment: 0-10 Pain Score:   5 Pain Type: Acute pain;Surgical pain Pain Location: Back Pain Orientation: Lower Pain  Descriptors: Aching;Discomfort;Dull Pain Onset: On-going Pain Intervention(s): RN made aware;Repositioned;Distraction  Individual Therapy Session  See FIM for current functional status  [name] 11/24/2011, 2:43 PM

## 2011-11-24 NOTE — Progress Notes (Signed)
Patient ID: Kariem Wolfson, male   DOB: September 09, 1961, 51 y.o.   MRN: [id number] Patient ID: Jamichael Knotts, male   DOB: May 20, 1961, 51 y.o.   MRN: [id number] Patient ID: Janelle Culton, male   DOB: [birth year], 51 y.o.   MRN: [id number] Subjective/Complaints:  Review of Systems  Unable to perform ROS: language  2/12--still no  Results with bowels. Denise pain. Appetite good Objective: Vital Signs: Blood pressure 129/71, pulse 70, temperature 98.2 F (36.8 C), temperature source Oral, resp. rate 18, SpO2 96.00%. Dg Abd 1 View  11/23/2011  *RADIOLOGY REPORT*  Clinical Data: Abdominal pain, distention.  ABDOMEN - 1 VIEW  Comparison: CT 11/16/2011  Findings: Moderate stool burden in the right side of the colon. Nonobstructive bowel gas pattern.  No free air.  No organomegaly or suspicious calcification.  Posterior spinal rods noted in the lower thoracic and upper lumbar spine.  IMPRESSION: Moderate stool burden in the right side of the colon.  Original Report Authenticated By: Cyndie Chime, M.D.   Results for orders placed during the hospital encounter of 11/20/11 (from the past 72 hour(s))  GLUCOSE, CAPILLARY     Status: Abnormal   Collection Time   11/21/11  7:41 AM      Component Value Range Comment   Glucose-Capillary 215 (*) 70 - 99 (mg/dL)   GLUCOSE, CAPILLARY     Status: Abnormal   Collection Time   11/21/11 11:26 AM      Component Value Range Comment   Glucose-Capillary 243 (*) 70 - 99 (mg/dL)    Comment 1 Notify RN     GLUCOSE, CAPILLARY     Status: Abnormal   Collection Time   11/21/11  4:53 PM      Component Value Range Comment   Glucose-Capillary 231 (*) 70 - 99 (mg/dL)   GLUCOSE, CAPILLARY     Status: Abnormal   Collection Time   11/21/11  8:44 PM      Component Value Range Comment   Glucose-Capillary 255 (*) 70 - 99 (mg/dL)    Comment 1 Notify RN     GLUCOSE, CAPILLARY     Status: Abnormal   Collection Time   11/22/11  7:16 AM      Component Value Range Comment   Glucose-Capillary 205  (*) 70 - 99 (mg/dL)   GLUCOSE, CAPILLARY     Status: Abnormal   Collection Time   11/22/11 11:50 AM      Component Value Range Comment   Glucose-Capillary 276 (*) 70 - 99 (mg/dL)   GLUCOSE, CAPILLARY     Status: Abnormal   Collection Time   11/22/11  4:14 PM      Component Value Range Comment   Glucose-Capillary 246 (*) 70 - 99 (mg/dL)   GLUCOSE, CAPILLARY     Status: Abnormal   Collection Time   11/22/11  9:37 PM      Component Value Range Comment   Glucose-Capillary 310 (*) 70 - 99 (mg/dL)   GLUCOSE, CAPILLARY     Status: Abnormal   Collection Time   11/23/11  7:09 AM      Component Value Range Comment   Glucose-Capillary 224 (*) 70 - 99 (mg/dL)    Comment 1 Notify RN     GLUCOSE, CAPILLARY     Status: Abnormal   Collection Time   11/23/11 12:15 PM      Component Value Range Comment   Glucose-Capillary 283 (*) 70 - 99 (mg/dL)    Comment 1 Notify RN  GLUCOSE, CAPILLARY     Status: Abnormal   Collection Time   11/23/11  4:31 PM      Component Value Range Comment   Glucose-Capillary 272 (*) 70 - 99 (mg/dL)   GLUCOSE, CAPILLARY     Status: Abnormal   Collection Time   11/23/11 10:26 PM      Component Value Range Comment   Glucose-Capillary 270 (*) 70 - 99 (mg/dL)    Comment 1 Notify RN      Physical Exam  Nursing note and vitals reviewed.  Constitutional: He is oriented to person, place, and time. He appears well-developed and well-nourished.  HENT:  Head: Normocephalic.  Eyes: Pupils are equal, round, and reactive to light. EOMI Neck: Normal range of motion. Neck supple.  Cardiovascular: Normal rate.  Pulmonary/Chest:  Decreased breath sounds RLL. Dyspnea with deep inspiration.  Abdominal: Soft. Bowel sounds are normal. He exhibits no distension. There is no tenderness.  Musculoskeletal: He exhibits no edema.  Neurological: He is alert and oriented to person, place, and time. He displays abnormal reflex.   He has 0 out of 5 strength in both lower extremities.  Areflexic-BLE. Follows commands  Upper extremity motor  intact.  There is a language barrier. No sensation below the level of injury Skin: Skin is warm and dry.  Psychiatric: He has a normal mood and affect. His behavior is normal.  Ext:  No swelling or edema in BLE, no discoloration 2/12  Assessment/Plan: 1. 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 Physiatrist is providing close team supervision and 24 hour management of active medical problems listed below. Physiatrist and rehab team continue to assess barriers to discharge/monitor patient progress toward functional and medical goals. FIM: FIM - Bathing Bathing Steps Patient Completed: Chest;Right Arm;Left Arm;Abdomen;Front perineal area Bathing: 3: Mod-Patient completes 5-7 54f 10 parts or 50-74% (HOB up ~30 degrees to bathe front perineal area)  FIM - Upper Body Dressing/Undressing Upper body dressing/undressing steps patient completed: Thread/unthread right sleeve of pullover shirt/dresss;Thread/unthread left sleeve of pullover shirt/dress;Put head through opening of pull over shirt/dress Upper body dressing/undressing: 4: Min-Patient completed 75 plus % of tasks FIM - Lower Body Dressing/Undressing Lower body dressing/undressing: 1: Total-Patient completed less than 25% of tasks  FIM - Toileting Toileting: 0: Activity did not occur  FIM - Archivist Transfers: 0-Activity did not occur  FIM - Banker Devices: Sliding board;Arm rests;Bed rails Bed/Chair Transfer: 1: Two helpers  FIM - Locomotion: Wheelchair Locomotion: Wheelchair: 0: Activity did not occur FIM - Locomotion: Ambulation Locomotion: Ambulation Assistive Devices: Other (comment) (currently paraplegic) Locomotion: Ambulation: 0: Activity did not  occur  Comprehension Comprehension Mode: Auditory Comprehension: 5-Follows basic conversation/direction: With no assist  Expression Expression Mode: Verbal Expression Assistive Devices: 6-Other (Comment) Expression: 5-Expresses basic needs/ideas: With extra time/assistive device  Social Interaction Social Interaction: 5-Interacts appropriately 90% of the time - Needs monitoring or encouragement for participation or interaction.  Problem Solving Problem Solving: 5-Solves basic 90% of the time/requires cueing < 10% of the time  Memory Memory: 6-More than reasonable amt of time  2. Anticoagulation/DVT prophylaxis with Pharmaceutical: No lovenox yet continue SCD 3. Pain Management: oxycodone  Medical Problem List and Plan:  1. 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 DVT Prophylaxis/Anticoagulation: SCDs. Will discuss subcutaneous Lovenox. Monitor for any signs of deep  vein thrombosis. Dopplers negative  3. Pain management. Lyrica 75 mg twice daily, oxycodone as needed. Monitor with increased activity. Patient complains generally of low back pain and denies dysesthesias in the lower extremities.  4. Neurogenic bowel:  Working on [employer].  Some stool in right colon. Continue miralax and sse 5. Diabetes mellitus. Hemoglobin A1c of 10.2. Needs further titration of lantus likely. Patient on oral agents of Actos 45 mg daily prior to admission as well as Glucophage 1000 mg daily. Check blood sugars a.c. and at bedtime. Provide diabetic teaching. Needs excellent sugar control to optimize wound healing Will restart metformin 6. Hyperlipidemia. Crestor 7. Neurogenic bladder: foley for now. LOS (Days) 4 A FACE TO FACE EVALUATION WAS PERFORMED  Lucille Witts T 11/24/2011, 6:53 AM

## 2011-11-24 NOTE — Progress Notes (Signed)
Note reviewed and accurately reflects treatment session.   

## 2011-11-24 NOTE — Progress Notes (Signed)
Physical Therapy Session Note  Patient Details  Name: John Meza MRN: [id number] Date of Birth: [birth year]  Today's Date: 11/24/2011 Time: 1100-1156 Time Calculation (min): 56 min  Short Term Goals: Week 1:  PT Short Term Goal 1(DO NOT USE): Patient will be able to perform Bed Mobility with Max-A PT Short Term Goal 2 (DO NOT USE): Patient will be able to perform Slideboard transfers with Max-A x 2 PT Short Term Goal 3 (DO NOT USE): Patient will be able to perform manual w/c mobility x 200' with S/Independence PT Short Term Goal 4 (DO NOT USE): Patient will be able to maintain static sitting balance x 5 minutes with S/Independence  Interpreter present during entire session.  Skilled Therapeutic Interventions/Progress Updates: Bed mobility retraining, pt with carryover for log roll technique instructing therapist how to assist (using bed rails). Max A for supine to sit on EOB. No complaints of dizziness at all during therapy session. Transfer training with slide board, emphasis on education for positioning and weightshifting, transfer was total A of 1 with second person to assist with holding w/c and assisting with positioning of slide board from bed to w/c. Education to pt's wife and pt regarding reasoning for boosting for pressure relief and recommendation for every 30 minutes with holding position for 2 minutes, with wife return demonstration and verbalized understanding. OOB chart placed in pt room and educated family on filling it out as well. Transfer training to/from w/c to mat with total A, worked on neuro reed on mat for dynamic sitting balance, trunk control, balance reactions, and reaching out of BOS with mod A overall when LOB. Education initiated with pt for w/c parts management. W/c propulsion with supervision to/from therapy gym.     Therapy Documentation Precautions:  Precautions Precautions: Back;Fall Precaution Booklet Issued: No Precaution Comments:  (0/5 B LE  MMT) Required Braces or Orthoses: Yes Spinal Brace: Thoracolumbosacral orthotic Restrictions Weight Bearing Restrictions: No   Pain: Pain Assessment Complaint of "a little" -premedicated   See FIM for current functional status  Therapy/Group: Individual Therapy  Karolee Stamps [facility] 11/24/2011, 12:23 PM

## 2011-11-24 NOTE — Progress Notes (Signed)
Physical Therapy Note  Patient Details  Name: John Meza MRN: [id number] Date of Birth: 08/20/1961 Today's Date: 11/24/2011  TIME IN/OUT 0900-0950 Cotreat with OT  Pain- none Skilled intervention:Therapeutic activity: cotreat with OT with OT working on pt brushing teeth/functional use of UE EOB, scooting,  and PT working on sitting during functional task; pt/family education with wife present and interpreter and ongoing education on pt instructing staff to bend knee before rolling, how to donn TLSO, finding COG and compensation.  Pt required up to mod A for sitting balance while using RUE, tending to fall backwards and requiring physical asssit to correct. Rolling with rails min to mod A with max cues to get pt to request someone bend his leg first despite repetition  Michaelene Song 11/24/2011, 10:49 AM

## 2011-11-24 NOTE — Progress Notes (Signed)
Patient ID: John Meza, male   DOB: [birth year], 51 y.o.   MRN: [id number] Acute d/c summary & Rehab H&P faxed to [employer] for [employer].  Also, contacted Pam about Team Conference schedule-- she plans to come to his conference next Tuesday 12/01/11.   She said a home eval sponsored by Workers [employer] will be done this Thursday 11/26/11 & emailed to this [facility] for MD to evaluate.

## 2011-11-25 DIAGNOSIS — W11XXXA Fall on and from ladder, initial encounter: Secondary | ICD-10-CM

## 2011-11-25 DIAGNOSIS — IMO0002 Reserved for concepts with insufficient information to code with codable children: Secondary | ICD-10-CM

## 2011-11-25 DIAGNOSIS — K592 Neurogenic bowel, not elsewhere classified: Secondary | ICD-10-CM

## 2011-11-25 DIAGNOSIS — N319 Neuromuscular dysfunction of bladder, unspecified: Secondary | ICD-10-CM

## 2011-11-25 DIAGNOSIS — Z5189 Encounter for other specified aftercare: Secondary | ICD-10-CM

## 2011-11-25 LAB — GLUCOSE, CAPILLARY
Glucose-Capillary: 237 mg/dL — ABNORMAL HIGH (ref 70–99)
Glucose-Capillary: 201 mg/dL — ABNORMAL HIGH (ref 70–99)

## 2011-11-25 MED ORDER — PREGABALIN 25 MG PO CAPS
25.0000 mg | ORAL_CAPSULE | Freq: Once | ORAL | Status: AC
  Administered 2011-11-25: 25 mg via ORAL
  Filled 2011-11-25: qty 1

## 2011-11-25 MED ORDER — PREGABALIN 50 MG PO CAPS
100.0000 mg | ORAL_CAPSULE | Freq: Two times a day (BID) | ORAL | Status: DC
  Administered 2011-11-25 – 2011-12-11 (×32): 100 mg via ORAL
  Filled 2011-11-25 (×32): qty 2

## 2011-11-25 MED ORDER — MAGNESIUM CITRATE PO SOLN
1.0000 | Freq: Once | ORAL | Status: AC
  Administered 2011-11-25: 1 via ORAL
  Filled 2011-11-25: qty 296

## 2011-11-25 NOTE — Progress Notes (Signed)
Occupational Therapy Session Notes  Patient Details  Name: John Meza MRN: [id number] Date of Birth: 07/11/1961  Today's Date: 11/25/2011  Therapy Documentation Precautions:  Precautions Precautions: Back;Fall Precaution Booklet Issued: No Precaution Comments:  (0/5 B LE MMT) Required Braces or Orthoses: Yes Spinal Brace: Thoracolumbosacral orthotic Restrictions Weight Bearing Restrictions: No  See FIM for current functional status  Session #1 4098-1191 - 60 Minutes Individual Therapy No complaints of pain Engaged in ADL retraining at bed level. UB/LB bathing and dressing performed supine in bed with HOB less than or equal to 45 degrees. Performed circle sit for pulling patient up in bed and bathing & dressing. Patient with good carryover from previous sessions. Performed multiple left & right side rolling/bed mobility during ADL. Sat edge of bed for grooming tasks (brushing teeth) with moderate assist for dynamic sitting balance/tolerance/endurance. Supine -> sit with max assist from therapist and sit -> supine with max assist from therapist. Wife present during entire session assisting prn; wife with great carryover from previous sessions as well.   Session #2 [id number] - 55 Minutes Individual Therapy No complaints of pain Upon entering room patient seated in w/c. Introduced leg loops and donned leg loops to bilateral LEs. Also engaged in grooming tasks seated in w/c at sink. Then had patient propel self -> therapy gym. Engaged in various therapeutic activities focusing on dynamic sitting balance seated in w/c with bilateral feet on floor and patient unsupported. Included wife in therapy with patient. Patient propelled self back to room at end of session.   Session #3 1430-1500 - 30 Minutes; Charged 15 minutes Co-Treatment with Physical Therapy Patient with 6/10 pain; RN notified Co-treatment with physical therapy focusing on bed mobility, supine -> sit, sit -> supine, using  leg loops to increase independence, circle sitting with back supported using therapy wedge, simulation of threading bilateral legs for pants while in circle sit, and edge of therapy mat -> w/c slide board transfer with total assist X2 (patient performing ~ 50% of transfer).   Mutasim Tuckey 11/25/2011, 11:32 AM

## 2011-11-25 NOTE — Plan of Care (Signed)
Problem: SCI BOWEL ELIMINATION Goal: RH STG SCI MANAGE BOWEL PROGRAM W/ASSIST OR AS APPROPRIATE STG SCI Manage bowel program w/assist or as appropriate.  Min assist

## 2011-11-25 NOTE — Plan of Care (Signed)
Problem: RH SKIN INTEGRITY Goal: RH STG ABLE TO PERFORM INCISION/WOUND CARE W/ASSISTANCE STG Able To Perform Incision/Wound Care With Assistance.  Outcome: Progressing Max assist (back incision)

## 2011-11-25 NOTE — Patient Care Conference (Signed)
Inpatient RehabilitationTeam Conference Note Date: 11/24/2011   Time: 3:23 PM    Patient Name: John Meza      Medical Record Number: [id number]  Date of Birth: 06/27/1961 Sex: Male         Room/Bed: 4001/4001-01 Payor Info: Payor: GENERIC WORKER'S COMP  Plan: [employer]io [employer]  Product Type: *No Product type*     Admitting Diagnosis: SCI - Para  Admit Date/Time:  11/20/2011  3:20 PM Admission Comments: No comment available   Primary Diagnosis:  SCI (spinal cord injury) Principal Problem: SCI (spinal cord injury)  Patient Active Problem List  Diagnoses Date Noted  . SCI (spinal cord injury) 11/20/2011  . 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 Concussion with brief LOC 11/16/2011  . Occipital scalp laceration 11/16/2011  . Accidental fall from ladder 11/16/2011  . DM, UNCOMPLICATED, TYPE II, UNCONTROLLED 05/26/2007  . OBESITY, MODERATE 05/26/2007  . HYPERLIPIDEMIA 05/25/2007  . ALLERGIC RHINITIS 05/25/2007  . DYSPEPSIA 05/25/2007    Expected Discharge Date: Expected Discharge Date: 12/11/11  Team Members Present: Physician: Dr. Faith Rogue Case Manager Present: Melanee Spry, RN Social Worker Present: Amada Jupiter, LCSW Nurse Present: Other (comment);Carlean Purl, RN Almira Coaster) PT Present: Karolee Stamps, Judith Blonder, PTA OT Present: Edwin Cap, OT Other (Discipline and Name): Tora Duck, PPS Coordinator     Current Status/Progress Goal Weekly Team Focus  Medical   pt with complete t11 injury after fall at work.  neurogenic bowel and bladder. no apparent cognitive issues  pain control and increased exercise tolerance  bowel evacuation and program   Bowel/Bladder   Establishing bowel program; foley patent  Manage bowel and bladder with min assist  Continue bowel program; education.   Swallow/Nutrition/ Hydration             ADL's   bathing - mod A, UB dsg - min A, LB dsg - tot A  bathing - min A, min A LB dsg, toilet transfers - min A  bed mobility, family  ed, sitting balance   Mobility   total A transfers, mod A dynamic sitting balance, supervision w/c mobility  min/mod A overall, modified independent w/c propulsion  transfer training, family ed, sitting balance   Communication             Safety/Cognition/ Behavioral Observations            Pain   Requesting Oxy IR Q 4-5 hours; effective.  Pain managed 2/10  Medicate before therapies, as needed; reposition.   Skin   Incision line C.D.I.; Buttocks (rectal area) slightly red; EPBC  No breakdown, no s/s infection;  Independent with repositioning, pressure relief  Assist to turn Q2hours; Boost in W/C; Education; monitor incision line.      *See Interdisciplinary Assessment and Plan and progress notes for long and short-term goals  Barriers to Discharge: profound neuro deficits    Possible Resolutions to Barriers:  family training, pt education.    Discharge Planning/Teaching Needs:  Home with wife who can provide 24/7 assistance - workers comp following - education ongoing      Team Discussion: Workers Comp to have home eval done [employer] to decide about home modifications needed.  Pt & family very motivated & participatory.   Revisions to Treatment Plan: none    Continued Need for Acute Rehabilitation Level of Care: The patient requires daily medical management by a physician with specialized training in physical medicine and rehabilitation for the following conditions: Daily analysis of laboratory values and/or radiology reports  with any subsequent need for medication adjustment of medical intervention for : Neurological problems;Post surgical problems  Brock Ra 11/25/2011, 5:23 PM

## 2011-11-25 NOTE — Plan of Care (Signed)
Problem: SCI BOWEL ELIMINATION Goal: RH STG MANAGE BOWEL WITH ASSISTANCE STG Manage Bowel with Assistance.  Outcome: Progressing Mod I Goal: RH STG SCI MANAGE BOWEL PROGRAM W/ASSIST OR AS APPROPRIATE STG SCI Manage bowel program w/assist or as appropriate.  Outcome: Progressing Mod I

## 2011-11-25 NOTE — Progress Notes (Signed)
Social Work  Assessment and Plan  Patient Name: John Meza  Today's Date: 11/25/2011  Problem List:  Patient Active Problem List  Diagnoses  . DM, UNCOMPLICATED, TYPE II, UNCONTROLLED  . HYPERLIPIDEMIA  . OBESITY, MODERATE  . ALLERGIC RHINITIS  . DYSPEPSIA  . T11 fx w/paraplegia  . Concussion with brief LOC  . Occipital scalp laceration  . Accidental fall from ladder  . SCI (spinal cord injury)    Past Medical History:  Past Medical History  Diagnosis Date  . Diabetes mellitus   . Hypertension     Past Surgical History: No past surgical history on file.  Discharge Planning  Discharge Planning Living Arrangements: Spouse/significant other;Children Support Systems: Spouse/significant other;Children;Church/faith community Assistance Needed: None PTA Do you have any problems obtaining your medications?: No Type of Residence: Private residence Home Care Services: No Patient expects to be discharged to:: Home with family Expected Discharge Date:  (TBD) Case Management Consult Needed: Yes (Comment) (already following)  Social/Family/Support Systems Social/Family/Support Systems Patient Roles: Spouse;Parent Contact Information: Daughters speak fluent english, therefore, they are primary contacts:  Mayrani and [name]  (there is also a 65 yo daughter in the home) Anticipated Caregiver: Wife to be primary caregiver - [name] Anticipated Caregiver's Contact Information: Daughters, Jadene Pierini (c) [phone] and [name] (c) [phone].  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) [employer] Return to Work Plans: none at this time Fish farm manager Issues: worker's comp case Guardian/Conservator: none  Abuse/Neglect Abuse/Neglect Assessment (Assessment to be  complete while patient is alone) Physical Abuse: Denies Verbal Abuse: Denies Sexual Abuse: Denies Exploitation of patient/patient's resources: Denies Self-Neglect: Denies  Emotional Status Emotional Status Pt's affect, behavior adn adjustment status: Pt lying in bed and answering questions via interpretor; denies any significant emotional distress; laughing easily with family; pt and family all note that they "have a very strong faith" and that is what they are relying on to cope with this difficult situation.  Depression screen deferred at this time but will monitor for appropriateness.  Daughters speak very openly about their family and the support they will provide to parents and each other. Recent Psychosocial Issues: None Pyschiatric History: None  Patient/Family Perceptions, Expectations & Goals Pt/Family Perceptions, Expectations and Goals Pt/Family understanding of illness & functional limitations: Daughter reports, "he knows that this (paralysis) is permanent...that he will never walk" Premorbid pt/family roles/activities: Very  traditional household - pt was the primary wage earner and support - "head" of the household.  Wife defers to pt and daughters to answer questions.  Daughters appear very confident in their role as the translators for their parents and offer guidance to both throughout interview Anticipated changes in roles/activities/participation: Daughters will need to become even more involved with guiding parents as they begin this new life with permanent disability and having to negtiate healthercare and community support systems Pt/family expectations/goals: Pt and family realistic about permanency of injury but pt hopeful he can regain some level of independence  Careers adviser: None Premorbid Home Care/DME Agencies: None Transportation available at discharge: yes Resource referrals recommended: Advocacy groups (SCI groups/  possible peer visit?)  Discharge Visual merchandiser Resources: Insurance Case Production designer, theatre/television/film (specify name) Environmental consultant) Surveyor, quantity Resources: Other (Comment) (Worker's Comp) Surveyor, quantity Screen Referred: No Living Expenses: Database administrator Management: Patient Home Management: Wife and daughters Patient/Family Preliminary  Plans: Home with wife/ family to provide 24/7 assistance DC Planning Additional Notes/Comments: Pt only with ~ first grade education in [place] but does read/ write in [place]; No English beyon very basic level (spoken only) - will need to utilize interpretor and assist of daughters whenever available  Clinical Impression:  Pleasant, oriented, spanish speaking gentleman (from [place]) here after unfortunate SCI at work.  Worker's Comp is involved.  Very supportive wife and daughters.  Pt and family all very motivated to get as much from CIR therapies as possible.  All note having a "very strong faith" in coping with the permanency of this injury and changes they will all face.  No s/s of significant emotional distress but will monitor throughout stay.  Roxanne Gates 11/25/2011

## 2011-11-25 NOTE — Progress Notes (Signed)
Subjective/Complaints:  Review of Systems  Unable to perform ROS: language  2/13--no bm. Slept well. Pain under control Objective: Vital Signs: Blood pressure 118/73, pulse 78, temperature 98.3 F (36.8 C), temperature source Oral, resp. rate 16, SpO2 100.00%. Dg Abd 1 View  11/23/2011  *RADIOLOGY REPORT*  Clinical Data: Abdominal pain, distention.  ABDOMEN - 1 VIEW  Comparison: CT 11/16/2011  Findings: Moderate stool burden in the right side of the colon. Nonobstructive bowel gas pattern.  No free air.  No organomegaly or suspicious calcification.  Posterior spinal rods noted in the lower thoracic and upper lumbar spine.  IMPRESSION: Moderate stool burden in the right side of the colon.  Original Report Authenticated By: Cyndie Chime, M.D.   Results for orders placed during the hospital encounter of 11/20/11 (from the past 72 hour(s))  GLUCOSE, CAPILLARY     Status: Abnormal   Collection Time   11/22/11 11:50 AM      Component Value Range Comment   Glucose-Capillary 276 (*) 70 - 99 (mg/dL)   GLUCOSE, CAPILLARY     Status: Abnormal   Collection Time   11/22/11  4:14 PM      Component Value Range Comment   Glucose-Capillary 246 (*) 70 - 99 (mg/dL)   GLUCOSE, CAPILLARY     Status: Abnormal   Collection Time   11/22/11  9:37 PM      Component Value Range Comment   Glucose-Capillary 310 (*) 70 - 99 (mg/dL)   GLUCOSE, CAPILLARY     Status: Abnormal   Collection Time   11/23/11  7:09 AM      Component Value Range Comment   Glucose-Capillary 224 (*) 70 - 99 (mg/dL)    Comment 1 Notify RN     GLUCOSE, CAPILLARY     Status: Abnormal   Collection Time   11/23/11 12:15 PM      Component Value Range Comment   Glucose-Capillary 283 (*) 70 - 99 (mg/dL)    Comment 1 Notify RN     GLUCOSE, CAPILLARY     Status: Abnormal   Collection Time   11/23/11  4:31 PM      Component Value Range Comment   Glucose-Capillary 272 (*) 70 - 99 (mg/dL)   GLUCOSE, CAPILLARY     Status: Abnormal   Collection  Time   11/23/11 10:26 PM      Component Value Range Comment   Glucose-Capillary 270 (*) 70 - 99 (mg/dL)    Comment 1 Notify RN     GLUCOSE, CAPILLARY     Status: Abnormal   Collection Time   11/24/11  7:07 AM      Component Value Range Comment   Glucose-Capillary 223 (*) 70 - 99 (mg/dL)    Comment 1 Notify RN     GLUCOSE, CAPILLARY     Status: Abnormal   Collection Time   11/24/11 11:51 AM      Component Value Range Comment   Glucose-Capillary 229 (*) 70 - 99 (mg/dL)   GLUCOSE, CAPILLARY     Status: Abnormal   Collection Time   11/24/11  4:18 PM      Component Value Range Comment   Glucose-Capillary 288 (*) 70 - 99 (mg/dL)    Comment 1 Notify RN     GLUCOSE, CAPILLARY     Status: Abnormal   Collection Time   11/24/11  9:26 PM      Component Value Range Comment   Glucose-Capillary 192 (*) 70 - 99 (mg/dL)  Comment 1 Notify RN      Physical Exam  Nursing note and vitals reviewed.  Constitutional: He is oriented to person, place, and time. He appears well-developed and well-nourished.  HENT:  Head: Normocephalic.  Eyes: Pupils are equal, round, and reactive to light. EOMI Neck: Normal range of motion. Neck supple.  Cardiovascular: Normal rate.  Pulmonary/Chest:  Decreased breath sounds RLL. Dyspnea with deep inspiration.  Abdominal: Soft. Bowel sounds are normal. He exhibits no distension. There is no tenderness.  Musculoskeletal: He exhibits no edema.  Neurological: He is alert and oriented to person, place, and time. He displays abnormal reflex.   He has 0 out of 5 strength in both lower extremities. Areflexic-BLE. Follows commands  Upper extremity motor  intact.  There is a language barrier. No sensation below the level of injury. Cognitively intact. Skin: Skin is warm and dry.  Psychiatric: He has a normal mood and affect. His behavior is normal.  Ext:  No swelling or edema in BLE, no discoloration 2/13  Assessment/Plan: 1. Functional deficits secondary to complete  Paraplegia from spinal cord injury, T11 fx s/p T9-L1 fusion  which require 3+ hours per day of interdisciplinary therapy in a comprehensive inpatient rehab setting. Physiatrist is providing close team supervision and 24 hour management of active medical problems listed below. Physiatrist and rehab team continue to assess barriers to discharge/monitor patient progress toward functional and medical goals. FIM: FIM - Bathing Bathing Steps Patient Completed: Right upper leg;Chest;Right Arm;Left Arm;Abdomen;Front perineal area Bathing: 3: Mod-Patient completes 5-7 66f 10 parts or 50-74%  FIM - Upper Body Dressing/Undressing Upper body dressing/undressing steps patient completed: Thread/unthread right sleeve of pullover shirt/dresss;Thread/unthread left sleeve of pullover shirt/dress;Put head through opening of pull over shirt/dress Upper body dressing/undressing: 0: Activity did not occur FIM - Lower Body Dressing/Undressing Lower body dressing/undressing: 1: Total-Patient completed less than 25% of tasks  FIM - Toileting Toileting: 0: Activity did not occur  FIM - Archivist Transfers: 1-Two helpers  FIM - Banker Devices: HOB elevated;Arm rests;Sliding board;Bed rails Bed/Chair Transfer: 2: Supine > Sit: Max A (lifting assist/Pt. 25-49%);1: Two helpers;1: Bed > Chair or W/C: Total A (helper does all/Pt. < 25%);1: Chair or W/C > Bed: Total A (helper does all/Pt. < 25%) (second person assist with holding w/c and positioning SB)  FIM - Locomotion: Wheelchair Locomotion: Wheelchair: 2: Travels 50 - 149 ft with supervision, cueing or coaxing FIM - Locomotion: Ambulation Locomotion: Ambulation Assistive Devices: Other (comment) (currently paraplegic) Locomotion: Ambulation: 0: Activity did not occur  Comprehension Comprehension Mode: Auditory Comprehension: 5-Follows basic conversation/direction: With extra time/assistive  device  Expression Expression Mode: Verbal Expression Assistive Devices: 6-Other (Comment) Expression: 5-Expresses basic 90% of the time/requires cueing < 10% of the time.  Social Interaction Social Interaction: 5-Interacts appropriately 90% of the time - Needs monitoring or encouragement for participation or interaction.  Problem Solving Problem Solving: 5-Solves basic 90% of the time/requires cueing < 10% of the time  Memory Memory: 6-More than reasonable amt of time  2. Anticoagulation/DVT prophylaxis with Pharmaceutical: No lovenox yet continue SCD 3. Pain Management: oxycodone  Medical Problem List and Plan:  1. 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 DVT Prophylaxis/Anticoagulation: SCDs. Will discuss subcutaneous Lovenox. Monitor for any signs of deep vein thrombosis. Dopplers negative  3. Pain management. Lyrica 75 mg twice daily, oxycodone as needed. Monitor with increased activity. Patient complains generally of mild to moderate low back pain and denies  dysesthesias in the lower extremities.  4. Neurogenic bowel:  Working on [employer].  Some stool in right colon. Continue miralax and sse 5. Diabetes mellitus. Hemoglobin A1c of 10.2. lantus adjusted for better glycemic control.  Patient on oral agents of Actos 45 mg daily prior to admission as well as Glucophage 1000 mg daily. Check blood sugars a.c. and at bedtime. Provide diabetic teaching. Needs excellent sugar control to optimize wound healing Will restart metformin 6. Hyperlipidemia. Crestor 7. Neurogenic bladder: foley for now.  I/o cath trial soon. LOS (Days) 5 A FACE TO FACE EVALUATION WAS PERFORMED  Daejon Lich T 11/25/2011, 7:47 AM

## 2011-11-25 NOTE — Progress Notes (Signed)
Physical Therapy Session Note  Patient Details  Name: John Meza MRN: [id number] Date of Birth: 02-13-1961  Today's Date: 11/25/2011 Time: 1115-1200 Time Calculation (min): 45 min  Short Term Goals: Week 1:  PT Short Term Goal 1(DO NOT USE): Patient will be able to perform Bed Mobility with Max-A PT Short Term Goal 2 (DO NOT USE): Patient will be able to perform Slideboard transfers with Max-A x 2 PT Short Term Goal 3 (DO NOT USE): Patient will be able to perform manual w/c mobility x 200' with S/Independence PT Short Term Goal 4 (DO NOT USE): Patient will be able to maintain static sitting balance x 5 minutes with S/Independence  Skilled Therapeutic Interventions/Progress Updates: Supine to sit with good carryover for rolling technique for therapist to assist with lower extremities. Pt able to complete supine to sit with only assist needed for lower extremity management. Max A slide board transfer to w/c with second person assist for positioning of board and stabilizing w/c, cueing and manual facilitation for weight shifts and technique. W/c propulsion to/from gym with supervision for general activity tolerance and UE strengthening. Neuro re-ed for scooting, trunk control, and weight shifting on mat for carryover for transfers and positioning in the w/c. Pt did excellent and seemed to really grasp the concept, mod A slide board back to w/c with minimal cueing. Repositioned in w/c with cueing and min A for lateral leans and push up.     Therapy Documentation Precautions:  Precautions Precautions: Back;Fall Precaution Booklet Issued: No Precaution Comments:  (0/5 B LE MMT) Required Braces or Orthoses: Yes Spinal Brace: Thoracolumbosacral orthotic Restrictions Weight Bearing Restrictions: No    Pain: 6/10 back pain, notified RN for pain medicine.    See FIM for current functional status  Therapy/Group: Individual Therapy  Karolee Stamps [facility] 11/25/2011, 12:05 PM

## 2011-11-25 NOTE — Progress Notes (Signed)
No stool with bowel program; dig stim x2 this am; assisted up to [facility] this afternoon, dig stim performed again with no results; passing flatus. Continues to c/o mild abd. Tenderness; episode of vomiting after lunch; denied any further nausea, no further emesis.  Pain controlled with Oxy IR, 20mg ; requests Q 4-5 hours; effective. Incision line C.D>I.; dressing changed. Wife at [facility]. John Meza

## 2011-11-25 NOTE — Progress Notes (Signed)
Physical Therapy Session Note  Patient Details  Name: John Meza MRN: [id number] Date of Birth: 1961/01/11  Today's Date: 11/25/2011 Total session 1415-1525 (70 minutes). Co Treat with OT 1430-1500  Short Term Goals: Week 1:  PT Short Term Goal 1(DO NOT USE): Patient will be able to perform Bed Mobility with Max-A PT Short Term Goal 2 (DO NOT USE): Patient will be able to perform Slideboard transfers with Max-A x 2 PT Short Term Goal 3 (DO NOT USE): Patient will be able to perform manual w/c mobility x 200' with S/Independence PT Short Term Goal 4 (DO NOT USE): Patient will be able to maintain static sitting balance x 5 minutes with S/Independence  Skilled Therapeutic Interventions/Progress Updates: Treatment session with focus on transfer training with slide board, bed mobility training from flat surface, using leg loops to assist with transfers and bed mobility, set up of w/c and parts management, dynamic sitting balance activity edge of mat to work on trunk control and balance, and circle sitting for ADLs. Pt demonstrated excellent carry over with techniques learned in previous therapy session in regards to weight shifts for transfers and trunk control during mobility. Pt is a fast learner, and picked up on techniques to use leg loops to aid with mobility quickly. Excellent participation. End of session, pt fatigued and returned to bed to rest.      Therapy Documentation Precautions:  Precautions Precautions: Back;Fall Precaution Booklet Issued: No Precaution Comments:  (0/5 B LE MMT) Required Braces or Orthoses: Yes Spinal Brace: Thoracolumbosacral orthotic Restrictions Weight Bearing Restrictions: No   Pain:  6/10 back pain - notified RN for pain medicine  See FIM for current functional status  Therapy/Group: Individual Therapy and Co-Treatment (30 minutes of 75 min session CoTreat with OT)  Tedd Sias 11/25/2011, 4:08 PM

## 2011-11-25 NOTE — Progress Notes (Signed)
Bowel program, suppository with dig stim x2, 30  mins apart each episode with no results; No stool felt in rectum; passing flatus, no further emesis; C/O mild abd,. tenderness this afternoon; PAm Love,PAC aware, Mag Citrate ordered, report to eve shift. Carlean Purl

## 2011-11-26 ENCOUNTER — Inpatient Hospital Stay (HOSPITAL_COMMUNITY): Payer: Worker's Compensation

## 2011-11-26 LAB — GLUCOSE, CAPILLARY
Glucose-Capillary: 154 mg/dL — ABNORMAL HIGH (ref 70–99)
Glucose-Capillary: 260 mg/dL — ABNORMAL HIGH (ref 70–99)

## 2011-11-26 MED ORDER — HYDROCERIN EX CREA
TOPICAL_CREAM | Freq: Two times a day (BID) | CUTANEOUS | Status: DC
  Administered 2011-11-26 – 2011-12-11 (×25): via TOPICAL
  Filled 2011-11-26: qty 113

## 2011-11-26 MED ORDER — INSULIN GLARGINE 100 UNIT/ML ~~LOC~~ SOLN
35.0000 [IU] | Freq: Every day | SUBCUTANEOUS | Status: DC
  Administered 2011-11-26 – 2011-12-10 (×15): 35 [IU] via SUBCUTANEOUS
  Filled 2011-11-26 (×3): qty 3

## 2011-11-26 NOTE — Progress Notes (Signed)
Physical Therapy Session Note  Patient Details  Name: John Meza MRN: [id number] Date of Birth: [birth year]  Today's Date: 11/26/2011 Time: 7829-5621 Time Calculation (min): 55 min  Skilled Therapeutic Interventions/Progress Updates: Treatment session focused on household w/c mobility in ADL apartment to access drawers in the bedroom, items in the kitchen and refrigerator, negotiating doorway thresholds, and on carpet. Practiced weaving in and out of cones in hallway and picking up items from w/c, focus on w/c safety. Practiced up/down ramp x 2 with min A, cueing needed for reminder of front wheels on w/c during tight turn on ramp. Max A slide board transfer back to bed due to slightly uphill transfer and pt fatigued/in pain and max A for sit to supine. Foley leaking - notified RN.     Therapy Documentation Precautions:  Precautions Precautions: Back;Fall Precaution Booklet Issued: No Precaution Comments:  (0/5 B LE MMT) Required Braces or Orthoses: Yes Spinal Brace: Thoracolumbosacral orthotic Restrictions Weight Bearing Restrictions: No   Pain: Pain Assessment Pain Score:   8/10 in back Premedicated  See FIM for current functional status  Therapy/Group: Individual Therapy  Karolee Stamps [facility] 11/26/2011, 4:25 PM

## 2011-11-26 NOTE — Progress Notes (Signed)
Orthopedic Tech Progress Note Patient Details:  John Filley [birth year] [id number]  Other Ortho Devices Type of Ortho Device: Other (comment) (foot drop recumb (prafo boots)) Ortho Device Location: bilatral (LE) Ortho Device Interventions: Application Viewed order on rn list  Jennye Moccasin 11/26/2011, 10:07 PM

## 2011-11-26 NOTE — Progress Notes (Signed)
Physical Therapy Note  Patient Details  Name: John Meza MRN: [id number] Date of Birth: Mar 31, 1961 Today's Date: 11/26/2011  1330-1410 (40 min cotx with OT, 15 min therapeutic activity charge)  Skilled intervention: dynamic sitting balance with 1 or 2 UE use, with multiple LOB but improved ability to self correct by using leg loops or pushing with UE's on mat, up to mod A to prevent fall but pt self correct LOB >50%  OT concentrating on weight shifting, functional use of UE's in unsupported sitting while PT addressed balance correction/safety, requires +2 for safety. Pt unable to comfortable cross legs or circle sit to work on balance d/t tightness in BLE's (hip rotators) and c/o back pain. Pt rated pain 8/10, nurse informed and pt willing to work thru until next meds due. Up to +2 A to scoot forward and back on mat, unable to shift weight laterally enough without LOB to unweight hip.   Michaelene Song 11/26/2011, 2:09 PM

## 2011-11-26 NOTE — Progress Notes (Signed)
Patient ID: John Meza, male   DOB: Sep 20, 1961, 51 y.o.   MRN: [id number] Subjective/Complaints:  Review of Systems  Unable to perform ROS: language  2/14--appears no bm yet.  Pt otherwise comfortable this am Objective: Vital Signs: Blood pressure 115/68, pulse 69, temperature 98.8 F (37.1 C), temperature source Oral, resp. rate 18, weight 83.825 kg (184 lb 12.8 oz), SpO2 99.00%. No results found. Results for orders placed during the hospital encounter of 11/20/11 (from the past 72 hour(s))  GLUCOSE, CAPILLARY     Status: Abnormal   Collection Time   11/23/11  7:09 AM      Component Value Range Comment   Glucose-Capillary 224 (*) 70 - 99 (mg/dL)    Comment 1 Notify RN     GLUCOSE, CAPILLARY     Status: Abnormal   Collection Time   11/23/11 12:15 PM      Component Value Range Comment   Glucose-Capillary 283 (*) 70 - 99 (mg/dL)    Comment 1 Notify RN     GLUCOSE, CAPILLARY     Status: Abnormal   Collection Time   11/23/11  4:31 PM      Component Value Range Comment   Glucose-Capillary 272 (*) 70 - 99 (mg/dL)   GLUCOSE, CAPILLARY     Status: Abnormal   Collection Time   11/23/11 10:26 PM      Component Value Range Comment   Glucose-Capillary 270 (*) 70 - 99 (mg/dL)    Comment 1 Notify RN     GLUCOSE, CAPILLARY     Status: Abnormal   Collection Time   11/24/11  7:07 AM      Component Value Range Comment   Glucose-Capillary 223 (*) 70 - 99 (mg/dL)    Comment 1 Notify RN     GLUCOSE, CAPILLARY     Status: Abnormal   Collection Time   11/24/11 11:51 AM      Component Value Range Comment   Glucose-Capillary 229 (*) 70 - 99 (mg/dL)   GLUCOSE, CAPILLARY     Status: Abnormal   Collection Time   11/24/11  4:18 PM      Component Value Range Comment   Glucose-Capillary 288 (*) 70 - 99 (mg/dL)    Comment 1 Notify RN     GLUCOSE, CAPILLARY     Status: Abnormal   Collection Time   11/24/11  9:26 PM      Component Value Range Comment   Glucose-Capillary 192 (*) 70 - 99 (mg/dL)    Comment 1 Notify RN     GLUCOSE, CAPILLARY     Status: Abnormal   Collection Time   11/25/11  7:21 AM      Component Value Range Comment   Glucose-Capillary 194 (*) 70 - 99 (mg/dL)    Comment 1 Notify RN     GLUCOSE, CAPILLARY     Status: Abnormal   Collection Time   11/25/11 11:15 AM      Component Value Range Comment   Glucose-Capillary 270 (*) 70 - 99 (mg/dL)    Comment 1 Notify RN     GLUCOSE, CAPILLARY     Status: Abnormal   Collection Time   11/25/11  3:53 PM      Component Value Range Comment   Glucose-Capillary 237 (*) 70 - 99 (mg/dL)    Comment 1 Notify RN     GLUCOSE, CAPILLARY     Status: Abnormal   Collection Time   11/25/11  9:04 PM  Component Value Range Comment   Glucose-Capillary 201 (*) 70 - 99 (mg/dL)    Comment 1 Notify RN      Physical Exam  Nursing note and vitals reviewed.  Constitutional: He is oriented to person, place, and time. He appears well-developed and well-nourished.  HENT:  Head: Normocephalic.  Eyes: Pupils are equal, round, and reactive to light. EOMI Neck: Normal range of motion. Neck supple.  Cardiovascular: Normal rate.  Pulmonary/Chest:  Decreased breath sounds RLL. Dyspnea with deep inspiration.  Abdominal: Soft. Bowel sounds are normal. He exhibits no distension. There is no tenderness.  Musculoskeletal: He exhibits no edema.  Neurological: He is alert and oriented to person, place, and time. He displays abnormal reflex.   He has 0 out of 5 strength in both lower extremities. Areflexic-BLE. Follows commands  Upper extremity motor  intact.  There is a language barrier. No sensation below the level of injury. Cognitively intact. Skin: Skin is warm and dry.  Psychiatric: He has a normal mood and affect. His behavior is normal.  Ext:  No swelling or edema in BLE, no discoloration 2/14  Assessment/Plan: 1. 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 Physiatrist is providing close team supervision and 24 hour management of active medical problems listed below. Physiatrist and rehab team continue to assess barriers to discharge/monitor patient progress toward functional and medical goals. FIM: FIM - Bathing Bathing Steps Patient Completed: Chest;Right Arm;Left Arm;Abdomen;Right upper leg;Left upper leg Bathing: 3: Mod-Patient completes 5-7 11f 10 parts or 50-74%  FIM - Upper Body Dressing/Undressing Upper body dressing/undressing steps patient completed: Thread/unthread right sleeve of pullover shirt/dresss;Thread/unthread left sleeve of pullover shirt/dress;Put head through opening of pull over shirt/dress Upper body dressing/undressing: 4: Min-Patient completed 75 plus % of tasks FIM - Lower Body Dressing/Undressing Lower body dressing/undressing: 1: Two helpers  FIM - Toileting Toileting: 0: Activity did not occur  FIM - Archivist Transfers: 0-Activity did not occur  FIM - Banker Devices: HOB elevated;Orthosis;Bed rails;Arm rests;Sliding board Bed/Chair Transfer: 3: Chair or W/C > Bed: Mod A (lift or lower assist)  FIM - Locomotion: Wheelchair Locomotion: Wheelchair: 2: Travels 50 - 149 ft with supervision, cueing or coaxing FIM - Locomotion: Ambulation Locomotion: Ambulation Assistive Devices: Other (comment) (currently paraplegic) Locomotion: Ambulation: 0: Activity did not occur  Comprehension Comprehension Mode: Auditory Comprehension: 5-Understands basic 90% of the time/requires cueing < 10% of the time  Expression Expression Mode: Verbal Expression Assistive Devices: 6-Other (Comment) Expression: 5-Expresses basic 90% of the time/requires cueing < 10% of the time.  Social Interaction Social Interaction: 5-Interacts appropriately 90% of the time - Needs monitoring or encouragement for  participation or interaction.  Problem Solving Problem Solving: 5-Solves basic 90% of the time/requires cueing < 10% of the time  Memory Memory: 6-More than reasonable amt of time  2. Anticoagulation/DVT prophylaxis with Pharmaceutical: No lovenox yet continue SCD 3. Pain Management: oxycodone  Medical Problem List and Plan:  1. 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 DVT Prophylaxis/Anticoagulation: SCDs. Will discuss subcutaneous Lovenox. Monitor for any signs of deep vein thrombosis. Dopplers negative  3. Pain management. Lyrica 75 mg twice daily, oxycodone as needed. Monitor with increased activity. Patient complains generally of mild to moderate low back pain and denies dysesthesias in the lower extremities.  4. Neurogenic bowel:  Working  on BM.  Mg citrate yesterday in addition to other interventions  -re check kub today 5. Diabetes mellitus. Hemoglobin A1c of 10.2. Will further increase lantus for better glycemic control.  Patient on oral agents of Actos 45 mg daily prior to admission as well as Glucophage 1000 mg daily. Check blood sugars a.c. and at bedtime. Provide diabetic teaching. Needs excellent sugar control to optimize wound healing Will restart metformin 6. Hyperlipidemia. Crestor 7. Neurogenic bladder: foley for now.  I/o cath trial soon. LOS (Days) 6 A FACE TO FACE EVALUATION WAS PERFORMED  SWARTZ,ZACHARY T 11/26/2011, 6:52 AM

## 2011-11-26 NOTE — Progress Notes (Signed)
Physical Therapy Session Note  Patient Details  Name: John Meza MRN: [id number] Date of Birth: 1961/05/22  Today's Date: 11/26/2011 Time: 1100-1200 Time Calculation (min): 60 min  Short Term Goals: Week 1:  PT Short Term Goal 1(DO NOT USE): Patient will be able to perform Bed Mobility with Max-A PT Short Term Goal 2 (DO NOT USE): Patient will be able to perform Slideboard transfers with Max-A x 2 PT Short Term Goal 3 (DO NOT USE): Patient will be able to perform manual w/c mobility x 200' with S/Independence PT Short Term Goal 4 (DO NOT USE): Patient will be able to maintain static sitting balance x 5 minutes with S/Independence  Skilled Therapeutic Interventions/Progress Updates: Focused on transfer training with slide board, initiating having pt place board and work on pt trying to manage lower extremities more during transfer, requires mod A overall. Bed mobility retraining on mat for rolling technique and supine <-> sit, increased difficulty due to increased pain in back (RN notified) required mod A mostly. Educated pt's wife on PROM and stretching to bilateral lower extremities for contracture prevention and flexibility to aid with mobility, wife performed return demonstration successfully. Co treat with Recreational therapy for therapeutic activity edge of mat to work on dynamic sitting balance, trunk control, and limits of stability for basketball toss to hoop and horsehoes with focus on weight shifts and reaching outside BOS, supervision static sitting up to mod A when LOB occurs during dynamic balance.     Therapy Documentation Precautions:  Precautions Precautions: Back;Fall Precaution Booklet Issued: No Precaution Comments:  (0/5 B LE MMT) Required Braces or Orthoses: Yes Spinal Brace: Thoracolumbosacral orthotic Restrictions Weight Bearing Restrictions: No Pain: C/o 6 pain in back - notified RN for pain medicine  See FIM for current functional status  Therapy/Group:  Individual Therapy and Co-Treatment (for 30 minutes with Recreational therapy)  Karolee Stamps [facility] 11/26/2011, 12:03 PM

## 2011-11-26 NOTE — Progress Notes (Signed)
Medium, semi-formed bowel movement after suppository and dig stim. No unplanned episode reported after the bowel program. Foley to straight drain with dark, gold color urine. Foley bag changed because of leakage. Staples removed to posterior scalp per order. Scant amount of blood visible after staple removal. Area cleansed, and topical abx applied to area. Pt able to tolerate procedure well. Pain controlled with Oxy IR 20mg  q 4-6hrs. Thoracic incision edemaous on left side, with a scant amount of serous drainage to distal end of the incision. PA aware.

## 2011-11-26 NOTE — Progress Notes (Signed)
Inpatient Diabetes Program Recommendations  AACE/ADA: New Consensus Statement on Inpatient Glycemic Control (2009)  Target Ranges:  Prepandial:   less than 140 mg/dL      Peak postprandial:   less than 180 mg/dL (1-2 hours)      Critically ill patients:  140 - 180 mg/dL    Inpatient Diabetes Program Recommendations Insulin - Basal: . Insulin - Meal Coverage: Please add 3-4 units meal coverage.  Pt receiving high doses correction before meals with glucose remaining in 200's.  Note: Elevated post-prandial CBGs therefore recommend Meal Coverage Novolog.

## 2011-11-26 NOTE — Progress Notes (Signed)
Patient Details  Name: Asif Muchow MRN: [id number] Date of Birth: 07/01/1961  Today's Date: 11/26/2011 Time: 08-1129   Skilled Therapeutic Interventions/Progress Updates: therapeutic activity edge of mat to work on dynamic sitting balance, trunk control, and limits of stability for basketball toss to hoop and horsehoes with focus on weight shifts and reaching outside BOS, supervision static sitting up to mod A when LOB occurs during dynamic balance. Pt c/o back pain 6/10, RN aware, and pt medicated.  Therapy/Group: Individual Therapy  Activity Level: Moderate:  Level of assist: Moderate   Kaytee Taliercio 11/26/2011, 1:00 PM

## 2011-11-26 NOTE — Progress Notes (Signed)
Occupational Therapy Session Notes  Patient Details  Name: John Meza MRN: [id number] Date of Birth: September 09, 1961  Today's Date: 11/26/2011  Precautions:  Precautions Precautions: Back;Fall Precaution Booklet Issued: No Precaution Comments:  (0/5 B LE MMT) Required Braces or Orthoses: Yes Spinal Brace: Thoracolumbosacral orthotic Restrictions Weight Bearing Restrictions: No  See FIM for current functional status  Session #1 0930-1030 - 60 Minutes Individual Therapy No complaints of pain Engaged in ADL retraining at bed level. Completed UB/LB bathing and dressing supine in bed with HOB up/down no more than 45 degrees secondary to TLSO orders. Focused skilled intervention on bed mobility, UB/LB bathing & dressing, circle sit for LB dressing, donning of TLSO, supine -> sit with max assist, edge of bed -> w/c max assist slide board transfer with wife holding chair in place, and grooming tasks seated in w/c at sink. Wife present during entire session performing hands on assist prn.   Session #2 1330-1440 - 70 Minutes OT charges - [id number] - 55 Minutes Co-Treatment with Physical Therapy Patient with 9/10 c/o pain; RN notified Co-Treatment with physical therapy for 30 minutes focusing on use of leg loops on bed mobility, functional use of bilateral UEs to assist with bed mobility, supine -> sit, sit -> supine, and slide board transfers <-> therapy mat with moderate assistance from therapist. Remainder of time spent creating HEP for patient. Personalized HEP and gave patient and wife handout with spanish verbal instructions and visual step-by-step instruction. Explained HEP, demonstrated HEP, and had patient return demonstrate each exercise.   Shimeka Bacot 11/26/2011, 10:27 AM

## 2011-11-27 DIAGNOSIS — IMO0002 Reserved for concepts with insufficient information to code with codable children: Secondary | ICD-10-CM

## 2011-11-27 DIAGNOSIS — K592 Neurogenic bowel, not elsewhere classified: Secondary | ICD-10-CM

## 2011-11-27 DIAGNOSIS — N319 Neuromuscular dysfunction of bladder, unspecified: Secondary | ICD-10-CM

## 2011-11-27 DIAGNOSIS — Z5189 Encounter for other specified aftercare: Secondary | ICD-10-CM

## 2011-11-27 DIAGNOSIS — W11XXXA Fall on and from ladder, initial encounter: Secondary | ICD-10-CM

## 2011-11-27 LAB — GLUCOSE, CAPILLARY
Glucose-Capillary: 142 mg/dL — ABNORMAL HIGH (ref 70–99)
Glucose-Capillary: 179 mg/dL — ABNORMAL HIGH (ref 70–99)

## 2011-11-27 MED ORDER — SENNA 8.6 MG PO TABS
1.0000 | ORAL_TABLET | Freq: Every day | ORAL | Status: DC
  Administered 2011-11-28 – 2011-12-10 (×13): 8.6 mg via ORAL
  Filled 2011-11-27 (×16): qty 1

## 2011-11-27 NOTE — Plan of Care (Signed)
Problem: SCI BOWEL ELIMINATION Goal: RH STG SCI MANAGE BOWEL PROGRAM W/ASSIST OR AS APPROPRIATE STG SCI Manage bowel program w/Supervision  Give Senna at night. Suppository at 0800. Dig stim at 0830.  Problem: RH SKIN INTEGRITY Goal: RH STG ABLE TO PERFORM INCISION/WOUND CARE W/ASSISTANCE STG Able To Perform Incision/Wound Care With Assistance.  Assess skin tear to lower lumbar incision until resolved. Assess  Slit in lower crevice for appropriate healing

## 2011-11-27 NOTE — Progress Notes (Signed)
Occupational Therapy Session Notes  Patient Details  Name: John Meza MRN: [id number] Date of Birth: July 19, 1961  Today's Date: 11/27/2011  Precautions:  Precautions Precautions: Back;Fall Precaution Booklet Issued: No Precaution Comments:  (0/5 B LE MMT) Required Braces or Orthoses: Yes Spinal Brace: Thoracolumbosacral orthotic Restrictions Weight Bearing Restrictions: No  See FIM for current functional status  Session #1 0930-1030 - 60 Minutes Individual Therapy Patient with c/o 5/10 pain in back; RN notified Treatment emphasis on ADL retraining at bed level. Wife present during entire session with hands on assistance prn. Focused skilled intervention on bed mobility, UB/LB bathing and dressing, introduced long handled sponge to increase independence with LB bathing, donning abdominal binder & TLSO, supine -> sit with minimal assistance from therapist, edge of bed -> w/c slide board transfer with max assist from therapist & wife standing behind w/c, overall activity tolerance/endurance, and grooming tasks seated in w/c at sink.   Session #2 1300-1400 - 60 Minutes Individual Therapy Minimal c/o pain, no rate given and no medication requested. RN aware Therapeutic exercise focusing on w/c -> therapy mat transfer using slide board with minimal assistance from therapist, HEP that was given to him yesterday; demonstrated and had patient return demonstrate all exercises x10 reps x3 sets sitting edge of mat. Some exercises performed with dumb bells, some without. Also engaged in bed mobility and side rolling left & right. Multiple rest breaks; however patient tends to push through without taking necessary rest breaks.    Woodford Strege 11/27/2011, 10:38 AM

## 2011-11-27 NOTE — Progress Notes (Signed)
Physical Therapy Weekly Progress Note  Patient Details  Name: John Meza MRN: [id number] Date of Birth: 1961/01/17  Today's Date: 11/27/2011  Patient has met 4 of 4 short term goals.  Pt is making good progress with functional mobility and sitting balance. For level surface transfers with slide board is overall mod A with pt initiating placement of slide board. Pt is using leg loops consistently for managing lower extremities and with bed mobility. Pt is tolerating OOB in w/c > 4 hours a day without symptoms of ortho stasis and family has been educated on pressure relief techniques for boosting. Pt unable perform w/c push up independently for successful pressure relief technique yet but making progress. Pt's wife also educated on PROM/stretching techniques and importance. Pt is a fast learner and family is very supportive. Specialty w/c evaluation was initiated with Fleet Contras from [facility] on 2/15.   Patient continues to demonstrate the following deficits: decreased strength, decreased functional mobility, abnormal tone, and decreased postural control and therefore will continue to benefit from skilled PT intervention to enhance overall performance with activity tolerance, balance, postural control, ability to compensate for deficits and knowledge of precautions.  Patient progressing toward long term goals..  Continue plan of care.  PT Short Term Goals Week 1:  PT Short Term Goal 1(DO NOT USE): Patient will be able to perform Bed Mobility with Max-A PT Short Term Goal 1 - Progress: Met PT Short Term Goal 2 (DO NOT USE): Patient will be able to perform Slideboard transfers with Max-A x 2 PT Short Term Goal 2 - Progress: Met PT Short Term Goal 3 (DO NOT USE): Patient will be able to perform manual w/c mobility x 200' with S/Independence PT Short Term Goal 3 - Progress: Met PT Short Term Goal 4 (DO NOT USE): Patient will be able to maintain static sitting balance x 5 minutes with  S/Independence PT Short Term Goal 4 - Progress: Met (supervision)  Week 2:  PT Short Term Goal 1 (Week 2): Pt will be able to transfer with min A using slide board PT Short Term Goal 2 (Week 2): Pt will be able to demonstrate dynamic sitting balance with min A for functional tasks PT Short Term Goal 3 (Week 2): Pt will be independent with calling for pressure relief when OOB in w/c  Skilled Therapeutic Interventions/Progress Updates:  Balance/vestibular training;Community reintegration;DME/adaptive equipment instruction;Functional mobility training;Neuromuscular re-education;Pain management;Patient/family education;Splinting/orthotics;Stair training;Therapeutic Activities;Therapeutic Exercise;UE/LE Strength taining/ROM;UE/LE Coordination activities;Wheelchair propulsion/positioning   Therapy Documentation Precautions:  Precautions Precautions: Back;Fall Precaution Booklet Issued: No Precaution Comments:  (0/5 B LE MMT) Required Braces or Orthoses: Yes Spinal Brace: Thoracolumbosacral orthotic Restrictions Weight Bearing Restrictions: No     See FIM for current functional status  Karolee Stamps [facility] 11/27/2011, 2:09 PM

## 2011-11-27 NOTE — Progress Notes (Signed)
Patient ID: John Meza, male   DOB: 1961/08/25, 51 y.o.   MRN: [id number] Patient ID: John Meza, male   DOB: 1961/08/05, 51 y.o.   MRN: [id number] Subjective/Complaints:  Review of Systems  Unable to perform ROS: language  2/15- bm yesterday. No issues overnight Objective: Vital Signs: Blood pressure 105/68, pulse 75, temperature 98.7 F (37.1 C), temperature source Oral, resp. rate 20, weight 83.825 kg (184 lb 12.8 oz), SpO2 96.00%. Dg Abd 1 View  11/26/2011  *RADIOLOGY REPORT*  Clinical Data: Abdominal pain, distension and constipation.  ABDOMEN - 1 VIEW  Comparison: 11/23/2011.  Findings: Shifting appearance of stool throughout the colon with decrease in amount of stool within the right colon and increase within the left colon.  The pattern is nonspecific.  Gas filled nondistended small bowel loops.  The possibility of free intraperitoneal air cannot be addressed on a supine view.  Postsurgical changes lower thoracic and upper lumbar spine.  IMPRESSION: Shifting appearance of stool throughout the colon.  .  Original Report Authenticated By: Fuller Canada, M.D.   Results for orders placed during the hospital encounter of 11/20/11 (from the past 72 hour(s))  GLUCOSE, CAPILLARY     Status: Abnormal   Collection Time   11/24/11 11:51 AM      Component Value Range Comment   Glucose-Capillary 229 (*) 70 - 99 (mg/dL)   GLUCOSE, CAPILLARY     Status: Abnormal   Collection Time   11/24/11  4:18 PM      Component Value Range Comment   Glucose-Capillary 288 (*) 70 - 99 (mg/dL)    Comment 1 Notify RN     GLUCOSE, CAPILLARY     Status: Abnormal   Collection Time   11/24/11  9:26 PM      Component Value Range Comment   Glucose-Capillary 192 (*) 70 - 99 (mg/dL)    Comment 1 Notify RN     GLUCOSE, CAPILLARY     Status: Abnormal   Collection Time   11/25/11  7:21 AM      Component Value Range Comment   Glucose-Capillary 194 (*) 70 - 99 (mg/dL)    Comment 1 Notify RN     GLUCOSE, CAPILLARY      Status: Abnormal   Collection Time   11/25/11 11:15 AM      Component Value Range Comment   Glucose-Capillary 270 (*) 70 - 99 (mg/dL)    Comment 1 Notify RN     GLUCOSE, CAPILLARY     Status: Abnormal   Collection Time   11/25/11  3:53 PM      Component Value Range Comment   Glucose-Capillary 237 (*) 70 - 99 (mg/dL)    Comment 1 Notify RN     GLUCOSE, CAPILLARY     Status: Abnormal   Collection Time   11/25/11  9:04 PM      Component Value Range Comment   Glucose-Capillary 201 (*) 70 - 99 (mg/dL)    Comment 1 Notify RN     GLUCOSE, CAPILLARY     Status: Abnormal   Collection Time   11/26/11  7:09 AM      Component Value Range Comment   Glucose-Capillary 154 (*) 70 - 99 (mg/dL)    Comment 1 Notify RN     GLUCOSE, CAPILLARY     Status: Abnormal   Collection Time   11/26/11 12:00 PM      Component Value Range Comment   Glucose-Capillary 270 (*) 70 - 99 (mg/dL)  Comment 1 Notify RN     GLUCOSE, CAPILLARY     Status: Abnormal   Collection Time   11/26/11  4:30 PM      Component Value Range Comment   Glucose-Capillary 260 (*) 70 - 99 (mg/dL)    Comment 1 Notify RN     GLUCOSE, CAPILLARY     Status: Abnormal   Collection Time   11/26/11  9:07 PM      Component Value Range Comment   Glucose-Capillary 178 (*) 70 - 99 (mg/dL)    Comment 1 Notify RN      Physical Exam  Nursing note and vitals reviewed.  Constitutional: He is oriented to person, place, and time. He appears well-developed and well-nourished.  HENT:  Head: Normocephalic.  Eyes: Pupils are equal, round, and reactive to light. EOMI Neck: Normal range of motion. Neck supple.  Cardiovascular: Normal rate.  Pulmonary/Chest:  Decreased breath sounds RLL. Dyspnea with deep inspiration.  Abdominal: Soft. Bowel sounds are normal. He exhibits no distension. There is no tenderness.  Musculoskeletal: He exhibits no edema.  Neurological: He is alert and oriented to person, place, and time. He displays abnormal reflex.   He has  0 out of 5 strength in both lower extremities. Areflexic-BLE. Follows commands  Upper extremity motor  intact.  There is a language barrier. No sensation below the level of injury. Cognitively intact. Skin: Skin is warm and dry.  Psychiatric: He has a normal mood and affect. His behavior is normal.  Ext:  No swelling or edema in BLE, no discoloration 2/15 exam  Assessment/Plan: 1. 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 Physiatrist is providing close team supervision and 24 hour management of active medical problems listed below. Physiatrist and rehab team continue to assess barriers to discharge/monitor patient progress toward functional and medical goals. FIM: FIM - Bathing Bathing Steps Patient Completed: Chest;Right Arm;Left Arm;Abdomen;Right upper leg;Left upper leg Bathing: 3: Mod-Patient completes 5-7 79f 10 parts or 50-74%  FIM - Upper Body Dressing/Undressing Upper body dressing/undressing steps patient completed: Thread/unthread right sleeve of pullover shirt/dresss;Thread/unthread left sleeve of pullover shirt/dress;Put head through opening of pull over shirt/dress Upper body dressing/undressing: 4: Min-Patient completed 75 plus % of tasks FIM - Lower Body Dressing/Undressing Lower body dressing/undressing: 1: Two helpers  FIM - Toileting Toileting: 0: Activity did not occur  FIM - Archivist Transfers: 0-Activity did not occur  FIM - Banker Devices: HOB elevated;Orthosis;Bed rails;Arm rests;Sliding board Bed/Chair Transfer: 2: Supine > Sit: Max A (lifting assist/Pt. 25-49%);2: Bed > Chair or W/C: Max A (lift and lower assist)  FIM - Locomotion: Wheelchair Locomotion: Wheelchair: 5: Travels 150 ft or more: maneuvers on rugs and over door sills with  supervision, cueing or coaxing FIM - Locomotion: Ambulation Locomotion: Ambulation Assistive Devices: Other (comment) (currently paraplegic) Locomotion: Ambulation: 0: Activity did not occur  Comprehension Comprehension Mode: Auditory Comprehension: 5-Understands basic 90% of the time/requires cueing < 10% of the time  Expression Expression Mode: Verbal Expression Assistive Devices: 6-Other (Comment) Expression: 5-Expresses basic 90% of the time/requires cueing < 10% of the time.  Social Interaction Social Interaction: 4-Interacts appropriately 75 - 89% of the time - Needs redirection for appropriate language or to initiate interaction.  Problem Solving Problem Solving: 5-Solves basic 90% of the time/requires cueing < 10% of the time  Memory  Memory: 6-More than reasonable amt of time  2. Anticoagulation/DVT prophylaxis with Pharmaceutical: No lovenox yet continue SCD 3. Pain Management: oxycodone  Medical Problem List and Plan:  1. 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 DVT Prophylaxis/Anticoagulation: SCDs. . Monitor for any signs of deep vein thrombosis. Dopplers negative  3. Pain management. Lyrica 75 mg twice daily, oxycodone as needed. Monitor with increased activity. Patient complains generally of mild to moderate low back pain and denies dysesthesias in the lower extremities.  4. Neurogenic bowel:   -kub shows stool moving to left. Should be able to attain a daily schedule 5. Diabetes mellitus. Hemoglobin A1c of 10.2.  Insulin further increased yesterday.  Patient on metformin 6. Hyperlipidemia. Crestor 7. Neurogenic bladder: foley for now.  I/o cath trial soon. LOS (Days) 7 A FACE TO FACE EVALUATION WAS PERFORMED  Marcellene Shivley T 11/27/2011, 7:15 AM

## 2011-11-27 NOTE — Progress Notes (Signed)
Physical Therapy Session Note  Patient Details  Name: John Meza MRN: [id number] Date of Birth: 1960-11-07  Today's Date: 11/27/2011 Time: 1400-1453 Time Calculation (min): 53 min  Short Term Goals: Week 2:  PT Short Term Goal 1 (Week 2): Pt will be able to transfer with min A using slide board PT Short Term Goal 2 (Week 2): Pt will be able to demonstrate dynamic sitting balance with min A for funtional tasks PT Short Term Goal 3 (Week 2): Pt will be independent with calling for pressure relief when OOB in w/c  Skilled Therapeutic Interventions/Progress Updates:  Balance/vestibular training;Community reintegration;DME/adaptive equipment instruction;Functional mobility training;Neuromuscular re-education;Pain management;Patient/family education;Splinting/orthotics;Stair training;Therapeutic Activities;Therapeutic Exercise;UE/LE Strength taining/ROM;UE/LE Coordination activities;Wheelchair propulsion/positioning   Therapy Documentation Precautions:  Precautions Precautions: Back Precaution Booklet Issued: No Precaution Comments:  (0/5 B LE MMT) Required Braces or Orthoses: Yes Spinal Brace: Thoracolumbosacral orthotic;Applied in supine position Other Brace/Splint: abdominal binder Restrictions Weight Bearing Restrictions: No Pain: Pain Assessment Pain Assessment: No/denies pain Locomotion : Wheelchair Mobility Distance: 150 supervision Other Treatments: Treatments Therapeutic Activity: Discussed with wife and patient type of car they use for transportation; wife to bring in measurements of car height and bed height to simulate in therapy.  Demonstrated to patient how to perform w/c set up and how to perform slideboard car transfer.  Pt gave repeat demonstration of car transfer in simulated car with slideboard and mod-max A for slideboard placement, and to maintain balance while assisting LE in and out of car with leg loops.  Discussed with patient and wife bed options for home  (regular bed vs. hospital bed); patient states there is no room for hospital bed.  Demonstrated to patient 2 methods for sup <> sit on flat bed with use of leg loops: first method through R sidelying and bringing LE up into bed but required max A to bring LE up into bed secondary to posterior LOB; second method through tricep push ups and scooting pelvis posterior on bed and bringing LE up onto bed through long sitting and long sitting to elbows to supine with no LOB but still mod-max A to perform.  Supine to sit with use of leg loops through log rolling with min-mod A x 2 reps.  Performed bed <> w/c transfers with assistance for board placement but min-mod A for transfer for assistance to maintain full anterior lean.    See FIM for current functional status  Therapy/Group: Individual Therapy  Edman Circle [facility] 11/27/2011, 5:01 PM

## 2011-11-27 NOTE — Progress Notes (Signed)
Physical Therapy Session Note  Patient Details  Name: John Meza MRN: [id number] Date of Birth: [birth year]  Today's Date: 11/27/2011 Time: 1100-1200 Time Calculation (min): 60 min  Short Term Goals: Week 1:  PT Short Term Goal 1(DO NOT USE): Patient will be able to perform Bed Mobility with Max-A -met PT Short Term Goal 2 (DO NOT USE): Patient will be able to perform Slideboard transfers with Max-A x 2 -met PT Short Term Goal 3 (DO NOT USE): Patient will be able to perform manual w/c mobility x 200' with S/Independence met at S level PT Short Term Goal 4 (DO NOT USE): Patient will be able to maintain static sitting balance x 5 minutes with - met S/Independence  Skilled Therapeutic Interventions/Progress Updates: Specialty w/c eval completed with [employer] from [employer] to fit pt with appropriate w/c including pressure relief cushion, lightweight w/c due to decreased activity tolerance, removable armrests for lateral transfers, and specialty back for postioning and posture due to decreased trunk control. Max A transfers for slide board to/from mat, pt managing lower extremities and leg rests independently using leg loops to assist. Pt still requires assist to place slide board but initiating attempt to place board as able.     Therapy Documentation Precautions:  Precautions Precautions: Back;Fall Precaution Booklet Issued: No Precaution Comments:  (0/5 B LE MMT) Required Braces or Orthoses: Yes Spinal Brace: Thoracolumbosacral orthotic Restrictions Weight Bearing Restrictions: No Pain:  reports "a little pain" - premedicated per pt report.  See FIM for current functional status  Therapy/Group: Individual Therapy  Karolee Stamps [facility] 11/27/2011, 12:11 PM

## 2011-11-27 NOTE — Progress Notes (Signed)
Large amount of brown, pasty stool after suppository at 0800, and dig stim 0830. Begin pt and family education regarding importance of bowel program. Incision remains edemaous along the incision line. Small skin tear x 2 noted to right distal end of back. Slit in lower gluteal fold. Allevyn drsg applied to area.

## 2011-11-28 LAB — GLUCOSE, CAPILLARY
Glucose-Capillary: 142 mg/dL — ABNORMAL HIGH (ref 70–99)
Glucose-Capillary: 228 mg/dL — ABNORMAL HIGH (ref 70–99)
Glucose-Capillary: 213 mg/dL — ABNORMAL HIGH (ref 70–99)

## 2011-11-28 MED ORDER — METFORMIN HCL 500 MG PO TABS
1000.0000 mg | ORAL_TABLET | Freq: Two times a day (BID) | ORAL | Status: DC
  Administered 2011-11-28 – 2011-12-11 (×26): 1000 mg via ORAL
  Filled 2011-11-28 (×31): qty 2

## 2011-11-28 NOTE — Progress Notes (Signed)
Physical Therapy Note  Patient Details  Name: John Meza MRN: [id number] Date of Birth: 26-Aug-1961 Today's Date: 11/28/2011 Pain: none reported at rest and mild pain with movement/bed mobility  Precautions; Fall Risk, TLSO and abdominal binder and  applied in supine before OOB/EOB Precautions: Back;Fall  Precaution Booklet Issued: No  Precaution Comments: (0/5 B LE MMT)  Required Braces or Orthoses: Yes  Spinal Brace: Thoracolumbosacral orthotic  Restrictions  Weight Bearing Restrictions: No  Therapeutic Exercise; (15') PROM B LE's Therapeutic Activity: (15') Donning TED hose, Abdominal binder, TLSO brace, and Leg loops with Max-A                                           Supine to Sit via Left side lying with Mod-A, bed->w/c via slide board with min-A x 2 Wheel Chair Management; (15') Patient able to manage parts and brakes with min-A and propelled w/c x 500' Independently    Individual Treatment Session   Jodelle Gross 11/28/2011, 10:22 AM

## 2011-11-28 NOTE — Plan of Care (Signed)
Problem: RH SKIN INTEGRITY Goal: RH STG ABLE TO PERFORM INCISION/WOUND CARE W/ASSISTANCE STG Able To Perform Incision/Wound Care With Assistance.  Outcome: Not Progressing Patient still max assist at this time

## 2011-11-28 NOTE — Progress Notes (Signed)
BP done this morning at 0800- suppository inserted after 30 minutes dig stim done and only very small amount of stool result. Rectal vault clear. Patient  Given 10mg  oxy IR for pain of 8 out of 10 at 1420 with minimal relief- to increase next dose. Patient foley intact with output. Patient allevyn changed to sacrum over gluteal split.

## 2011-11-28 NOTE — Progress Notes (Signed)
Subjective/Complaints:  Review of Systems  Unable to perform ROS: language  2/16- pain under fair control. Sleeping ok Objective: Vital Signs: Blood pressure 106/63, pulse 73, temperature 98.1 F (36.7 C), temperature source Oral, resp. rate 18, weight 83.825 kg (184 lb 12.8 oz), SpO2 94.00%. Dg Abd 1 View  11/26/2011  *RADIOLOGY REPORT*  Clinical Data: Abdominal pain, distension and constipation.  ABDOMEN - 1 VIEW  Comparison: 11/23/2011.  Findings: Shifting appearance of stool throughout the colon with decrease in amount of stool within the right colon and increase within the left colon.  The pattern is nonspecific.  Gas filled nondistended small bowel loops.  The possibility of free intraperitoneal air cannot be addressed on a supine view.  Postsurgical changes lower thoracic and upper lumbar spine.  IMPRESSION: Shifting appearance of stool throughout the colon.  .  Original Report Authenticated By: Fuller Canada, M.D.   Results for orders placed during the hospital encounter of 11/20/11 (from the past 72 hour(s))  GLUCOSE, CAPILLARY     Status: Abnormal   Collection Time   11/25/11  7:21 AM      Component Value Range Comment   Glucose-Capillary 194 (*) 70 - 99 (mg/dL)    Comment 1 Notify RN     GLUCOSE, CAPILLARY     Status: Abnormal   Collection Time   11/25/11 11:15 AM      Component Value Range Comment   Glucose-Capillary 270 (*) 70 - 99 (mg/dL)    Comment 1 Notify RN     GLUCOSE, CAPILLARY     Status: Abnormal   Collection Time   11/25/11  3:53 PM      Component Value Range Comment   Glucose-Capillary 237 (*) 70 - 99 (mg/dL)    Comment 1 Notify RN     GLUCOSE, CAPILLARY     Status: Abnormal   Collection Time   11/25/11  9:04 PM      Component Value Range Comment   Glucose-Capillary 201 (*) 70 - 99 (mg/dL)    Comment 1 Notify RN     GLUCOSE, CAPILLARY     Status: Abnormal   Collection Time   11/26/11  7:09 AM      Component Value Range Comment   Glucose-Capillary 154 (*)  70 - 99 (mg/dL)    Comment 1 Notify RN     GLUCOSE, CAPILLARY     Status: Abnormal   Collection Time   11/26/11 12:00 PM      Component Value Range Comment   Glucose-Capillary 270 (*) 70 - 99 (mg/dL)    Comment 1 Notify RN     GLUCOSE, CAPILLARY     Status: Abnormal   Collection Time   11/26/11  4:30 PM      Component Value Range Comment   Glucose-Capillary 260 (*) 70 - 99 (mg/dL)    Comment 1 Notify RN     GLUCOSE, CAPILLARY     Status: Abnormal   Collection Time   11/26/11  9:07 PM      Component Value Range Comment   Glucose-Capillary 178 (*) 70 - 99 (mg/dL)    Comment 1 Notify RN     GLUCOSE, CAPILLARY     Status: Abnormal   Collection Time   11/27/11  7:18 AM      Component Value Range Comment   Glucose-Capillary 142 (*) 70 - 99 (mg/dL)    Comment 1 Notify RN     GLUCOSE, CAPILLARY     Status: Abnormal  Collection Time   11/27/11 12:06 PM      Component Value Range Comment   Glucose-Capillary 262 (*) 70 - 99 (mg/dL)    Comment 1 Notify RN     GLUCOSE, CAPILLARY     Status: Abnormal   Collection Time   11/27/11  4:47 PM      Component Value Range Comment   Glucose-Capillary 185 (*) 70 - 99 (mg/dL)    Comment 1 Notify RN     GLUCOSE, CAPILLARY     Status: Abnormal   Collection Time   11/27/11  9:49 PM      Component Value Range Comment   Glucose-Capillary 179 (*) 70 - 99 (mg/dL)    Comment 1 Notify RN      Physical Exam  Nursing note and vitals reviewed.  Constitutional: He is oriented to person, place, and time. He appears well-developed and well-nourished.  HENT:  Head: Normocephalic.  Eyes: Pupils are equal, round, and reactive to light. EOMI Neck: Normal range of motion. Neck supple.  Cardiovascular: Normal rate.  Pulmonary/Chest:  Decreased breath sounds RLL. Dyspnea with deep inspiration.  Abdominal: Soft. Bowel sounds are normal. He exhibits no distension. There is no tenderness.  Musculoskeletal: He exhibits no edema.  Neurological: He is alert and  oriented to person, place, and time. He displays abnormal reflex.   He has 0 out of 5 strength in both lower extremities. Areflexic-BLE. Follows commands  Upper extremity motor  intact.  There is a language barrier. No sensation below the level of injury. Cognitively intact. Skin: Skin is warm and dry.  Psychiatric: He has a normal mood and affect. His behavior is normal.  Ext:  No swelling or edema in BLE, no discoloration 2/16-exam Assessment/Plan: 1. 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 Physiatrist is providing close team supervision and 24 hour management of active medical problems listed below. Physiatrist and rehab team continue to assess barriers to discharge/monitor patient progress toward functional and medical goals. FIM: FIM - Bathing Bathing Steps Patient Completed: Chest;Right Arm;Left Arm;Abdomen;Front perineal area;Right upper leg;Left upper leg Bathing: 0: Activity did not occur  FIM - Upper Body Dressing/Undressing Upper body dressing/undressing steps patient completed: Thread/unthread right sleeve of pullover shirt/dresss;Thread/unthread left sleeve of pullover shirt/dress;Put head through opening of pull over shirt/dress Upper body dressing/undressing: 0: Activity did not occur FIM - Lower Body Dressing/Undressing Lower body dressing/undressing: 0: Activity did not occur  FIM - Toileting Toileting: 0: Activity did not occur  FIM - Archivist Transfers: 0-Activity did not occur  FIM - Banker Devices: Sliding board;Arm rests;Bed rails Bed/Chair Transfer: 5: Set-up assist to: Apply orthosis/W/C set-up  FIM - Locomotion: Wheelchair Distance: 150 Locomotion: Wheelchair: 0: Activity did not occur FIM - Locomotion: Ambulation Locomotion: Ambulation  Assistive Devices: Other (comment) (currently paraplegic) Locomotion: Ambulation: 0: Activity did not occur  Comprehension Comprehension Mode: Auditory Comprehension: 5-Follows basic conversation/direction: With extra time/assistive device  Expression Expression Mode: Verbal (second language: english) Expression Assistive Devices: 6-Other (Comment) Expression: 7-Expresses complex ideas: With no assist  Social Interaction Social Interaction: 7-Interacts appropriately with others - No medications needed.  Problem Solving Problem Solving Mode: Asleep Problem Solving: 5-Solves basic 90% of the time/requires cueing < 10% of the time  Memory Memory Mode: Asleep Memory: 6-More than reasonable amt of time  2. Anticoagulation/DVT prophylaxis with Pharmaceutical:  No lovenox yet continue SCD 3. Pain Management: oxycodone  Medical Problem List and Plan:  1. Meza.-11 fracture after fall with complete spinal cord injury  2.. DVT Prophylaxis/Anticoagulation: SCDs. . Monitor for any signs of deep vein thrombosis. Dopplers negative  3. Pain management. Lyrica 75 mg twice daily, oxycodone as needed. Monitor with increased activity. Patient complains generally of mild to moderate low back pain and denies dysesthesias in the lower extremities.  4. Neurogenic bowel:   -getting toward daily to qod routine 5. Diabetes mellitus. Hemoglobin A1c of 10.2. Will titrate metformin upward  Patient on metformin 6. Hyperlipidemia. Crestor 7. Neurogenic bladder: foley for now.  I/o cath trial soon. LOS (Days) 8 A FACE TO FACE EVALUATION WAS PERFORMED  John Meza 11/28/2011, 6:21 AM

## 2011-11-29 LAB — GLUCOSE, CAPILLARY
Glucose-Capillary: 164 mg/dL — ABNORMAL HIGH (ref 70–99)
Glucose-Capillary: 154 mg/dL — ABNORMAL HIGH (ref 70–99)
Glucose-Capillary: 189 mg/dL — ABNORMAL HIGH (ref 70–99)

## 2011-11-29 NOTE — Progress Notes (Addendum)
Suppository inserted this morning with patient on side and waited 30 minutes. After dig stimulation patient had small bowel movement- rectal vault then clear. Brief changed. Need to work on trunk control to be able to transfer patient to [facility]. 10oxy IR given at [phone] for pain. Upon reassessment patient asleep. Patient dressing changed this after noon- steris appeared to have moved towards middle of incision- no open areas noted but did see some dried old blood to mid steris- old dressing with no drainage noted. Patient had incontinent medium bowel movement this evening after getting back to bed.

## 2011-11-29 NOTE — Progress Notes (Signed)
Patient ID: John Meza, male   DOB: 09/13/1961, 51 y.o.   MRN: [id number] Subjective/Complaints:  Review of Systems  Unable to perform ROS: language  2/17---no new issues Objective: Vital Signs: Blood pressure 102/68, pulse 65, temperature 98.6 F (37 C), temperature source Oral, resp. rate 20, weight 83.825 kg (184 lb 12.8 oz), SpO2 98.00%. No results found. Results for orders placed during the hospital encounter of 11/20/11 (from the past 72 hour(s))  GLUCOSE, CAPILLARY     Status: Abnormal   Collection Time   11/26/11  7:09 AM      Component Value Range Comment   Glucose-Capillary 154 (*) 70 - 99 (mg/dL)    Comment 1 Notify RN     GLUCOSE, CAPILLARY     Status: Abnormal   Collection Time   11/26/11 12:00 PM      Component Value Range Comment   Glucose-Capillary 270 (*) 70 - 99 (mg/dL)    Comment 1 Notify RN     GLUCOSE, CAPILLARY     Status: Abnormal   Collection Time   11/26/11  4:30 PM      Component Value Range Comment   Glucose-Capillary 260 (*) 70 - 99 (mg/dL)    Comment 1 Notify RN     GLUCOSE, CAPILLARY     Status: Abnormal   Collection Time   11/26/11  9:07 PM      Component Value Range Comment   Glucose-Capillary 178 (*) 70 - 99 (mg/dL)    Comment 1 Notify RN     GLUCOSE, CAPILLARY     Status: Abnormal   Collection Time   11/27/11  7:18 AM      Component Value Range Comment   Glucose-Capillary 142 (*) 70 - 99 (mg/dL)    Comment 1 Notify RN     GLUCOSE, CAPILLARY     Status: Abnormal   Collection Time   11/27/11 12:06 PM      Component Value Range Comment   Glucose-Capillary 262 (*) 70 - 99 (mg/dL)    Comment 1 Notify RN     GLUCOSE, CAPILLARY     Status: Abnormal   Collection Time   11/27/11  4:47 PM      Component Value Range Comment   Glucose-Capillary 185 (*) 70 - 99 (mg/dL)    Comment 1 Notify RN     GLUCOSE, CAPILLARY     Status: Abnormal   Collection Time   11/27/11  9:49 PM      Component Value Range Comment   Glucose-Capillary 179 (*) 70 - 99  (mg/dL)    Comment 1 Notify RN     GLUCOSE, CAPILLARY     Status: Abnormal   Collection Time   11/28/11  7:23 AM      Component Value Range Comment   Glucose-Capillary 142 (*) 70 - 99 (mg/dL)   GLUCOSE, CAPILLARY     Status: Abnormal   Collection Time   11/28/11 11:27 AM      Component Value Range Comment   Glucose-Capillary 196 (*) 70 - 99 (mg/dL)   GLUCOSE, CAPILLARY     Status: Abnormal   Collection Time   11/28/11  4:45 PM      Component Value Range Comment   Glucose-Capillary 228 (*) 70 - 99 (mg/dL)   GLUCOSE, CAPILLARY     Status: Abnormal   Collection Time   11/28/11  9:09 PM      Component Value Range Comment   Glucose-Capillary 213 (*) 70 - 99 (  mg/dL)    Comment 1 Notify RN      Physical Exam  Nursing note and vitals reviewed.  Constitutional: He is oriented to person, place, and time. He appears well-developed and well-nourished.  HENT:  Head: Normocephalic.  Eyes: Pupils are equal, round, and reactive to light. EOMI Neck: Normal range of motion. Neck supple.  Cardiovascular: Normal rate.  Pulmonary/Chest:  Decreased breath sounds RLL. Dyspnea with deep inspiration.  Abdominal: Soft. Bowel sounds are normal. He exhibits no distension. There is no tenderness.  Musculoskeletal: He exhibits no edema.  Neurological: He is alert and oriented to person, place, and time. He displays abnormal reflex.   He has 0 out of 5 strength in both lower extremities. Areflexic-BLE. Follows commands  Upper extremity motor  intact.  There is a language barrier. No sensation below the level of injury. Cognitively intact. Skin: Skin is warm and dry.  Psychiatric: He has a normal mood and affect. His behavior is normal.  Ext:  No swelling or edema in BLE, no discoloration 2/17--exam Assessment/Plan: 1. 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 Physiatrist is providing close team supervision and 24 hour management of active medical problems listed below. Physiatrist and rehab team continue to assess barriers to discharge/monitor patient progress toward functional and medical goals. FIM: FIM - Bathing Bathing Steps Patient Completed: Chest;Right Arm;Left Arm;Abdomen;Front perineal area;Right upper leg;Left upper leg Bathing:  (wife assisted patient)  FIM - Upper Body Dressing/Undressing Upper body dressing/undressing steps patient completed: Thread/unthread right sleeve of pullover shirt/dresss;Thread/unthread left sleeve of pullover shirt/dress;Put head through opening of pull over shirt/dress Upper body dressing/undressing:  (Wife assisted patient) FIM - Lower Body Dressing/Undressing Lower body dressing/undressing: 0: Activity did not occur  FIM - Toileting Toileting: 0: Activity did not occur  FIM - Archivist Transfers: 0-Activity did not occur  FIM - Banker Devices: Sliding board Bed/Chair Transfer: 2: Bed > Chair or W/C: Max A (lift and lower assist);2: Chair or W/C > Bed: Max A (lift and lower assist);3: Supine > Sit: Mod A (lifting assist/Pt. 50-74%/lift 2 legs;4: Bed > Chair or W/C: Min A (steadying Pt. > 75%)  FIM - Locomotion: Wheelchair Distance: 150 Locomotion: Wheelchair: 5: Travels 150 ft or more: maneuvers on rugs and over door sills with supervision, cueing or coaxing FIM - Locomotion: Ambulation Locomotion: Ambulation Assistive Devices: Other (comment) (currently paraplegic) Locomotion: Ambulation: 0: Activity did not occur (paraplegic)  Comprehension Comprehension Mode: Auditory Comprehension: 6-Follows complex conversation/direction: With extra time/assistive device  Expression Expression Mode: Verbal Expression Assistive Devices: 6-Other (Comment) Expression: 6-Expresses complex ideas: With extra time/assistive device  Social  Interaction Social Interaction: 6-Interacts appropriately with others with medication or extra time (anti-anxiety, antidepressant).  Problem Solving Problem Solving Mode: Asleep Problem Solving: 6-Solves complex problems: With extra time  Memory Memory Mode: Asleep Memory: 6-More than reasonable amt of time  2. Anticoagulation/DVT prophylaxis with Pharmaceutical: No lovenox yet continue SCD 3. Pain Management: oxycodone  Medical Problem List and Plan:  1. 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 DVT Prophylaxis/Anticoagulation: SCDs. . Monitor for any signs of deep vein thrombosis. Dopplers negative  3. Pain management. Lyrica 75 mg twice daily, oxycodone as needed. Monitor with increased activity. Patient complains generally of mild to moderate low back pain and denies dysesthesias in the lower extremities.  4. Neurogenic bowel:   -getting toward daily to qod routine 5. Diabetes mellitus. Hemoglobin A1c of 10.2. Will titrate metformin upward  Patient on metformin 6. Hyperlipidemia. Crestor 7. Neurogenic bladder: foley for now.  I/o cath trial soon. LOS (Days) 9 A FACE TO FACE EVALUATION WAS PERFORMED  Jasiya Markie T 11/29/2011, 6:35 AM

## 2011-11-29 NOTE — Progress Notes (Signed)
Occupational Therapy Session Note  Patient Details  Name: John Meza MRN: [id number] Date of Birth: [birth year]  Today's Date: 11/29/2011 Time: 1000-1115 Time Calculation (min): 75 min  Skilled Therapeutic Interventions/Progress Updates:    Addressed therapeutic bathing and dressing at bed level with pt and wife present.  Pt. Demonstrated more independence with upper body bathing and dressing.  He rolled with moderated assist to right and left.  Pt. Verbalized knowledge of when to don brace.  Sat on EOB wih BUE support and minimal assist.  Transferred to wc with sliding board and max assist +1 for safety.    Therapy Documentation Precautions:  Precautions Precautions: Back Precaution Booklet Issued: No Precaution Comments:  (0/5 B LE MMT) Required Braces or Orthoses: Yes Spinal Brace: Thoracolumbosacral orthotic;Applied in supine position Other Brace/Splint: abdominal binder Restrictions Weight Bearing Restrictions: No   Pain:  NONE  ADL Upper Body Bathing: Minimal assistance (due to left arm pain) Where Assessed-Upper Body Bathing: Bed level Lower Body Bathing:  Minimal assistWhere Assessed-Lower Body Bathing: Bed level Upper Body Dressing: set up Lower Body Dressing:total assist  See FIM for current functional status Therapy/Group: Individual Therapy  Humberto Seals 11/29/2011, 4:48 PM

## 2011-11-30 LAB — GLUCOSE, CAPILLARY
Glucose-Capillary: 234 mg/dL — ABNORMAL HIGH (ref 70–99)
Glucose-Capillary: 163 mg/dL — ABNORMAL HIGH (ref 70–99)

## 2011-11-30 NOTE — Progress Notes (Signed)
Suppository and dig stim resulting in large, pasty stool. Pt to begin getting up to [facility] for bowel program.

## 2011-11-30 NOTE — Progress Notes (Signed)
Physical Therapy Session Note  Patient Details  Name: Guillaume Weninger MRN: [id number] Date of Birth: July 15, 1961  Today's Date: 11/30/2011 Time: 5621-3086 Time Calculation (min): 57 min  Short Term Goals: Week 2:  PT Short Term Goal 1 (Week 2): Pt will be able to transfer with min A using slide board PT Short Term Goal 2 (Week 2): Pt will be able to demonstrate dynamic sitting balance with min A for funtional tasks PT Short Term Goal 3 (Week 2): Pt will be independent with calling for pressure relief when OOB in w/c  Skilled Therapeutic Interventions/Progress Updates: Treatment session with focus on dynamic sitting balance and trunk control seated edge of mat for Wii Boxing (no UE support except to catch balance as needed), tennis, and Wii Dance. Pt initially min A overall progressing to close supervision, good self correction to catch balance required mod A x 3 with boxing activity due to LOB posterior (no UE support). Slide board transfer with min a from mat to w/c, assist from wife to position slide board. W/c propulsion modified independent on unit.     Therapy Documentation Precautions:  Precautions Precautions: Back Precaution Booklet Issued: No Precaution Comments:  (0/5 B LE MMT) Required Braces or Orthoses: Yes Spinal Brace: Thoracolumbosacral orthotic;Applied in supine position Other Brace/Splint: abdominal binder Restrictions Weight Bearing Restrictions: No   Pain:  Denies pain  See FIM for current functional status  Therapy/Group: Individual Therapy  Karolee Stamps [facility] 11/30/2011, 4:21 PM

## 2011-11-30 NOTE — Progress Notes (Signed)
Occupational Therapy Weekly Progress Note  Patient Details  Name: Royce Stegman MRN: [id number] Date of Birth: Feb 25, 1961  Today's Date: 11/30/2011  Patient has met 4 of 5 short term goals.  Patient is progressing great towards LTGs. Patient did not meet donning/doffing TLSO with minimal assistance secondary to therapist and wife donning/doffing TLSO secondary to back precautions. See STGs in navigator.   Patient continues to demonstrate the following deficits: decreased independence with ADLs & IADLs, decreased independence with bed mobility, decreased overall activity tolerance/endurance, decreased independence with slide board transfers onto bed and ADL transfers. Therefore, patient will continue to benefit from skilled OT intervention to enhance overall performance with BADL and iADL.  Patient progressing toward long term goals..  Continue plan of care.  OT Short Term Goals: See Navigator  Precautions:  Precautions Precautions: Back Precaution Booklet Issued: No Precaution Comments:  (0/5 B LE MMT) Required Braces or Orthoses: Yes Spinal Brace: Thoracolumbosacral orthotic;Applied in supine position Other Brace/Splint: abdominal binder Restrictions Weight Bearing Restrictions: No  ADL - See FIM  See FIM for current functional status  Tacori Kvamme 11/30/2011, 3:12 PM

## 2011-11-30 NOTE — Progress Notes (Signed)
Patient ID: John Meza, male   DOB: [birth year], 51 y.o.   MRN: [id number] Patient ID: John Meza, male   DOB: 04-03-1961, 51 y.o.   MRN: [id number] Subjective/Complaints:  Review of Systems  Unable to perform ROS: language  2/18- no new complaints.  Denies pain. Objective: Vital Signs: Blood pressure 100/62, pulse 73, temperature 98.5 F (36.9 C), temperature source Oral, resp. rate 18, weight 83.825 kg (184 lb 12.8 oz), SpO2 97.00%. No results found. Results for orders placed during the hospital encounter of 11/20/11 (from the past 72 hour(s))  GLUCOSE, CAPILLARY     Status: Abnormal   Collection Time   11/27/11  7:18 AM      Component Value Range Comment   Glucose-Capillary 142 (*) 70 - 99 (mg/dL)    Comment 1 Notify RN     GLUCOSE, CAPILLARY     Status: Abnormal   Collection Time   11/27/11 12:06 PM      Component Value Range Comment   Glucose-Capillary 262 (*) 70 - 99 (mg/dL)    Comment 1 Notify RN     GLUCOSE, CAPILLARY     Status: Abnormal   Collection Time   11/27/11  4:47 PM      Component Value Range Comment   Glucose-Capillary 185 (*) 70 - 99 (mg/dL)    Comment 1 Notify RN     GLUCOSE, CAPILLARY     Status: Abnormal   Collection Time   11/27/11  9:49 PM      Component Value Range Comment   Glucose-Capillary 179 (*) 70 - 99 (mg/dL)    Comment 1 Notify RN     GLUCOSE, CAPILLARY     Status: Abnormal   Collection Time   11/28/11  7:23 AM      Component Value Range Comment   Glucose-Capillary 142 (*) 70 - 99 (mg/dL)   GLUCOSE, CAPILLARY     Status: Abnormal   Collection Time   11/28/11 11:27 AM      Component Value Range Comment   Glucose-Capillary 196 (*) 70 - 99 (mg/dL)   GLUCOSE, CAPILLARY     Status: Abnormal   Collection Time   11/28/11  4:45 PM      Component Value Range Comment   Glucose-Capillary 228 (*) 70 - 99 (mg/dL)   GLUCOSE, CAPILLARY     Status: Abnormal   Collection Time   11/28/11  9:09 PM      Component Value Range Comment   Glucose-Capillary 213  (*) 70 - 99 (mg/dL)    Comment 1 Notify RN     GLUCOSE, CAPILLARY     Status: Abnormal   Collection Time   11/29/11  7:15 AM      Component Value Range Comment   Glucose-Capillary 120 (*) 70 - 99 (mg/dL)   GLUCOSE, CAPILLARY     Status: Abnormal   Collection Time   11/29/11 12:14 PM      Component Value Range Comment   Glucose-Capillary 164 (*) 70 - 99 (mg/dL)   GLUCOSE, CAPILLARY     Status: Abnormal   Collection Time   11/29/11  4:32 PM      Component Value Range Comment   Glucose-Capillary 154 (*) 70 - 99 (mg/dL)   GLUCOSE, CAPILLARY     Status: Abnormal   Collection Time   11/29/11  8:07 PM      Component Value Range Comment   Glucose-Capillary 189 (*) 70 - 99 (mg/dL)    Comment 1 Notify RN  Physical Exam  Nursing note and vitals reviewed.  Constitutional: He is oriented to person, place, and time. He appears well-developed and well-nourished.  HENT:  Head: Normocephalic.  Eyes: Pupils are equal, round, and reactive to light. EOMI Neck: Normal range of motion. Neck supple.  Cardiovascular: Normal rate.  Pulmonary/Chest:  Decreased breath sounds RLL. Dyspnea with deep inspiration.  Abdominal: Soft. Bowel sounds are normal. He exhibits no distension. There is no tenderness.  Musculoskeletal: He exhibits no edema.  Neurological: He is alert and oriented to person, place, and time. He displays abnormal reflex.   He has 0 out of 5 strength in both lower extremities. Areflexic-BLE. Follows commands  Upper extremity motor  intact.  There is a language barrier. No sensation below the level of injury. Cognitively intact. Skin: Skin is warm and dry.  Wound is clean and intact with steristrips. Psychiatric: He has a normal mood and affect. His behavior is normal.  Ext:  No swelling or edema in BLE, no discoloration 2/18--exam Assessment/Plan: 1. 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 Physiatrist is providing close team supervision and 24 hour management of active medical problems listed below. Physiatrist and rehab team continue to assess barriers to discharge/monitor patient progress toward functional and medical goals. FIM: FIM - Bathing Bathing Steps Patient Completed: Chest;Right Arm;Left Arm;Abdomen;Front perineal area;Right upper leg;Left upper leg;Right lower leg (including foot);Left lower leg (including foot) Bathing: 4: Min-Patient completes 8-9 18f 10 parts or 75+ percent  FIM - Upper Body Dressing/Undressing Upper body dressing/undressing steps patient completed: Thread/unthread right sleeve of pullover shirt/dresss;Thread/unthread left sleeve of pullover shirt/dress;Put head through opening of pull over shirt/dress;Pull shirt over trunk Upper body dressing/undressing: 5: Set-up assist to: Obtain clothing/put away FIM - Lower Body Dressing/Undressing Lower body dressing/undressing: 1: Total-Patient completed less than 25% of tasks  FIM - Toileting Toileting: 0: Activity did not occur  FIM - Archivist Transfers: 0-Activity did not occur  FIM - Banker Devices: Sliding board Bed/Chair Transfer: 5: Supine > Sit: Supervision (verbal cues/safety issues);2: Bed > Chair or W/C: Max A (lift and lower assist)  FIM - Locomotion: Wheelchair Distance: 150 Locomotion: Wheelchair: 5: Travels 150 ft or more: maneuvers on rugs and over door sills with supervision, cueing or coaxing FIM - Locomotion: Ambulation Locomotion: Ambulation Assistive Devices: Other (comment) (currently paraplegic) Locomotion: Ambulation: 0: Activity did not occur (paraplegic)  Comprehension Comprehension Mode: Auditory Comprehension: 7-Follows complex conversation/direction: With no assist  Expression Expression Mode: Verbal Expression Assistive Devices: 6-Other  (Comment) Expression: 6-Expresses complex ideas: With extra time/assistive device  Social Interaction Social Interaction: 6-Interacts appropriately with others with medication or extra time (anti-anxiety, antidepressant).  Problem Solving Problem Solving Mode: Asleep Problem Solving: 6-Solves complex problems: With extra time  Memory Memory Mode: Asleep Memory: 6-More than reasonable amt of time  2. Anticoagulation/DVT prophylaxis with Pharmaceutical: No lovenox yet continue SCD 3. Pain Management: oxycodone  Medical Problem List and Plan:  1. 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 DVT Prophylaxis/Anticoagulation: SCDs. . Monitor for any signs of deep vein thrombosis. Dopplers negative  3. Pain management. Lyrica 75 mg twice daily, oxycodone as needed. Monitor with increased activity. Patient complains generally of mild to moderate low back pain and denies dysesthesias in the lower extremities.  4. Neurogenic bowel:   -getting toward daily to  qod/qd routine. Speak with rn. 5. Diabetes mellitus. Hemoglobin A1c of 10.2. Will titrate metformin upward  Patient on metformin pta.  cbg's showing improvement. 6. Hyperlipidemia. Crestor 7. Neurogenic bladder: foley for now.  I/o cath trial this week once we have bowels regular. LOS (Days) 10 A FACE TO FACE EVALUATION WAS PERFORMED  Carson Meche T 11/30/2011, 7:08 AM

## 2011-11-30 NOTE — Progress Notes (Signed)
Occupational Therapy Session Notes  Patient Details  Name: John Meza MRN: [id number] Date of Birth: 07/22/1961  Today's Date: 11/30/2011 Time: 4098-1191 Time Calculation (min): 55 min - 1st Session Time Calculation (min):  55 Min - 2nd Session  Skilled Therapeutic Interventions/Progress Updates:   1st Session - Individual Therapy. Patient with 5/10 c/o pain in back; RN notified.  Focused skilled intervention on ADL retraining at bed level with compensatory strategies and AE prn to increase independence with ADLs, bed mobility, overall activity tolerance/endurance, pain management, supine -> sit with min/steady assist from therapist (patient able to get bilateral LEs off bed with use of leg loops), and edge of bed -> w/c slide board transfer with minimal assist from therapist. Patient also sat at sink in w/c for grooming tasks with set-up assistance from family.   2nd Session - Individual Therapy. No complaints of pain Engaged in w/c -> bed slide board transfer with minimal assistance. Then completed slide board transfer from edge of bed -> drop arm BSC with minimal assistance from therapist. BSC -> w/c slide board transfer with minimal assistance from therapist. Patient used leg loops during all transfers to increase overall independence with transfers. Introduced Scientist, research (life sciences) for tub/shower transfers. Educated & Discussed transfer for home use with patient and wife, they understood need for TLSO during shower. Patient propelled self back to therapy gym for transfer -> therapy mat with min assist. Therapeutic exercise on therapy mat focusing on dynamic sitting balance/tolerance/endurance, increasing overall activity tolerance/endurance, and UE/UB strengthening exercises with and without dumb bells. Left patient on mat with wife present for physical therapy session.   Therapy Documentation Precautions:  Precautions Precautions: Back Precaution Booklet Issued: No Precaution Comments:   (0/5 B LE MMT) Required Braces or Orthoses: Yes Spinal Brace: Thoracolumbosacral orthotic;Applied in supine position Other Brace/Splint: abdominal binder Restrictions Weight Bearing Restrictions: No  See FIM for current functional status   Synia Douglass 11/30/2011, 10:59 AM

## 2011-11-30 NOTE — Progress Notes (Addendum)
Physical Therapy Session Note  Patient Details  Name: John Meza MRN: [id number] Date of Birth: 02/04/1961  Today's Date: 11/30/2011 Time: 1030-1127 Time Calculation (min): 57 min  Short Term Goals: Week 2:  PT Short Term Goal 1 (Week 2): Pt will be able to transfer with min A using slide board PT Short Term Goal 2 (Week 2): Pt will be able to demonstrate dynamic sitting balance with min A for funtional tasks PT Short Term Goal 3 (Week 2): Pt will be independent with calling for pressure relief when OOB in w/c  Skilled Therapeutic Interventions/Progress Updates: Treatment session focused on bed <-> w/c transfers in ADL apartment on soft bed, pt sets up for transfer independently, min A to place slide board from w/c to mat (total A from bed to w/c for placement), mod A downhill/max A uphill and difficulty scooting on soft bed. Discussed hospital bed as option (per previous PT note) and pt and wife state they would like a hospital bed to aid with bed mobility and dressing. Mod A overall supine <-> sit using leg loops and cueing for technique.  UE strengthening and balance seated edge of wheelchair for weighted ball toss on rebounder, self correcting posture and working on trunk control and lower extremities on the floor for proprioceptive input.   W/c propulsion on carpet for home environment obstacle negotiation, modified independent     Therapy Documentation Precautions:  Precautions Precautions: Back Precaution Booklet Issued: No Precaution Comments:  (0/5 B LE MMT) Required Braces or Orthoses: Yes Spinal Brace: Thoracolumbosacral orthotic;Applied in supine position Other Brace/Splint: abdominal binder Restrictions Weight Bearing Restrictions: No   Pain: No complaint of pain.   See FIM for current functional status  Therapy/Group: Individual Therapy  Karolee Stamps [facility] 11/30/2011, 11:26 AM

## 2011-12-01 LAB — GLUCOSE, CAPILLARY
Glucose-Capillary: 135 mg/dL — ABNORMAL HIGH (ref 70–99)
Glucose-Capillary: 145 mg/dL — ABNORMAL HIGH (ref 70–99)

## 2011-12-01 NOTE — Progress Notes (Signed)
Physical Therapy Session Note  Patient Details  Name: John Meza MRN: [id number] Date of Birth: 12-26-1960  Today's Date: 12/01/2011 Time: 1478-2956 Time Calculation (min): 58 min  Short Term Goals: Week 2:  PT Short Term Goal 1 (Week 2): Pt will be able to transfer with min A using slide board PT Short Term Goal 2 (Week 2): Pt will be able to demonstrate dynamic sitting balance with min A for funtional tasks PT Short Term Goal 3 (Week 2): Pt will be independent with calling for pressure relief when OOB in w/c  Skilled Therapeutic Interventions/Progress Updates: Treatment session with focus on bed mobility using leg loops and uneven transfers w/c <-> mat multiple repetitions for each, overall min A. Scooting on edge of mat to right and left to aid with transfer technique. Elbow props from sitting x 3 reps each side x 2 sets for strengthening and to aid with bed mobility techniques.     Therapy Documentation Precautions:  Precautions Precautions: Back Precaution Booklet Issued: No Precaution Comments:  (0/5 B LE MMT) Required Braces or Orthoses: Yes Spinal Brace: Thoracolumbosacral orthotic;Applied in supine position Other Brace/Splint: abdominal binder Restrictions Weight Bearing Restrictions: No Pain: No complaint of pain  See FIM for current functional status  Therapy/Group: Individual Therapy  Karolee Stamps [facility] 12/01/2011, 12:02 PM

## 2011-12-01 NOTE — Progress Notes (Signed)
Occupational Therapy Session Notes  Patient Details  Name: John Meza MRN: [id number] Date of Birth: 23-Jan-1961  Today's Date: 12/01/2011  Skilled Therapeutic Interventions/Progress Updates:  Session #1 0930-1030 - 60 Minutes Individual Therapy No complaints of pain Upon entering room patient supine in bed after bowel program and BSC transfer with nursing staff. Engaged in ADL retraining at bed level in supine position with HOB less than or equal too 45 degrees secondary to TLSO orders. Patient able to complete bathing at an overall supervision level without use of AE; circle sitting for LB bathing. Patient able to complete UB dressing with set-up assist and LB dressing with moderate assistance (threading bilateral LEs into pant legs and pulling pants up to waist, patient needs assistance with socks). Also engaged in bed mobility, supine -> sit with supervision (patient using leg loops), edge of bed -> w/c slide board min assist transfer, and grooming tasks seated at sink in w/c with set-up assist.   Session #2 1300-1400 - 60 Minutes Individual Therapy No complaints of pain Treatment focus on family education with return demonstration from wife on slide board transfers w/c <-> edge of bed. Patient able to perform w/c management for transfer, wife performed safe assistance with transfer. Therapist educated patients wife on safe body mechanics during transfer, blocking bilateral knees, and assistance during transfers at this time. Also engaged in simple meal prep in ADL apartment kitchen at w/c level. Patient able to complete simple meal prep with set-up/supervision assistance.   Therapy Documentation  Precautions:  Precautions Precautions: Back Precaution Booklet Issued: No Precaution Comments:  (0/5 B LE MMT) Required Braces or Orthoses: Yes Spinal Brace: Thoracolumbosacral orthotic;Applied in supine position Other Brace/Splint: abdominal binder Restrictions Weight Bearing  Restrictions: No  See FIM for current functional status  Daemian Gahm 12/01/2011, 10:03 AM

## 2011-12-01 NOTE — Progress Notes (Addendum)
Suppository insert at 0815 resulting in large, pasty stool prior to transfer to bsc. Pt transferred to bedside commode to facility emptying. Rectal vault checked, no stool felt. Initiated family education with daughters regarding purpose of bowel and bladder program. Provided educational information.

## 2011-12-01 NOTE — Progress Notes (Signed)
Subjective/Complaints:  Review of Systems  Unable to perform ROS: language  2/19- no new complaints.  Denies pain. Objective: Vital Signs: Blood pressure 100/56, pulse 73, temperature 99.3 F (37.4 C), temperature source Oral, resp. rate 16, weight 83.825 kg (184 lb 12.8 oz), SpO2 98.00%. No results found. Results for orders placed during the hospital encounter of 11/20/11 (from the past 72 hour(s))  GLUCOSE, CAPILLARY     Status: Abnormal   Collection Time   11/28/11  7:23 AM      Component Value Range Comment   Glucose-Capillary 142 (*) 70 - 99 (mg/dL)   GLUCOSE, CAPILLARY     Status: Abnormal   Collection Time   11/28/11 11:27 AM      Component Value Range Comment   Glucose-Capillary 196 (*) 70 - 99 (mg/dL)   GLUCOSE, CAPILLARY     Status: Abnormal   Collection Time   11/28/11  4:45 PM      Component Value Range Comment   Glucose-Capillary 228 (*) 70 - 99 (mg/dL)   GLUCOSE, CAPILLARY     Status: Abnormal   Collection Time   11/28/11  9:09 PM      Component Value Range Comment   Glucose-Capillary 213 (*) 70 - 99 (mg/dL)    Comment 1 Notify RN     GLUCOSE, CAPILLARY     Status: Abnormal   Collection Time   11/29/11  7:15 AM      Component Value Range Comment   Glucose-Capillary 120 (*) 70 - 99 (mg/dL)   GLUCOSE, CAPILLARY     Status: Abnormal   Collection Time   11/29/11 12:14 PM      Component Value Range Comment   Glucose-Capillary 164 (*) 70 - 99 (mg/dL)   GLUCOSE, CAPILLARY     Status: Abnormal   Collection Time   11/29/11  4:32 PM      Component Value Range Comment   Glucose-Capillary 154 (*) 70 - 99 (mg/dL)   GLUCOSE, CAPILLARY     Status: Abnormal   Collection Time   11/29/11  8:07 PM      Component Value Range Comment   Glucose-Capillary 189 (*) 70 - 99 (mg/dL)    Comment 1 Notify RN     GLUCOSE, CAPILLARY     Status: Abnormal   Collection Time   11/30/11  8:16 AM      Component Value Range Comment   Glucose-Capillary 234 (*) 70 - 99 (mg/dL)    Comment 1  Notify RN     GLUCOSE, CAPILLARY     Status: Abnormal   Collection Time   11/30/11 12:18 PM      Component Value Range Comment   Glucose-Capillary 156 (*) 70 - 99 (mg/dL)    Comment 1 Notify RN     GLUCOSE, CAPILLARY     Status: Abnormal   Collection Time   11/30/11  4:29 PM      Component Value Range Comment   Glucose-Capillary 186 (*) 70 - 99 (mg/dL)    Comment 1 Notify RN     GLUCOSE, CAPILLARY     Status: Abnormal   Collection Time   11/30/11  9:44 PM      Component Value Range Comment   Glucose-Capillary 163 (*) 70 - 99 (mg/dL)    Comment 1 Notify RN      Physical Exam  Nursing note and vitals reviewed.  Constitutional: He is oriented to person, place, and time. He appears well-developed and well-nourished.  HENT:  Head: Normocephalic.  Eyes: Pupils are equal, round, and reactive to light. EOMI Neck: Normal range of motion. Neck supple.  Cardiovascular: Normal rate.  Pulmonary/Chest:  Decreased breath sounds RLL. Dyspnea with deep inspiration.  Abdominal: Soft. Bowel sounds are normal. He exhibits no distension. There is no tenderness.  Musculoskeletal: He exhibits no edema.  Neurological: He is alert and oriented to person, place, and time. He displays abnormal reflex.   He has 0 out of 5 strength in both lower extremities. Areflexic-BLE. Follows commands  Upper extremity motor  intact.  There is a language barrier. No sensation below the level of injury. Cognitively intact. Skin: Skin is warm and dry.  Wound is clean and intact with steristrips and no drainage. Psychiatric: He has a normal mood and affect. His behavior is normal.  Ext:  No swelling or edema in BLE, no discoloration 2/19--exam Assessment/Plan: 1. 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 Physiatrist is providing close team supervision and 24 hour  management of active medical problems listed below. Physiatrist and rehab team continue to assess barriers to discharge/monitor patient progress toward functional and medical goals. FIM: FIM - Bathing Bathing Steps Patient Completed: Chest;Right Arm;Left Arm;Abdomen;Front perineal area;Buttocks;Right upper leg;Left upper leg;Right lower leg (including foot);Left lower leg (including foot) Bathing: 5: Set-up assist to: Obtain items  FIM - Upper Body Dressing/Undressing Upper body dressing/undressing steps patient completed: Thread/unthread right sleeve of pullover shirt/dresss;Thread/unthread left sleeve of pullover shirt/dress;Put head through opening of pull over shirt/dress;Pull shirt over trunk Upper body dressing/undressing: 5: Set-up assist to: Obtain clothing/put away FIM - Lower Body Dressing/Undressing Lower body dressing/undressing: 1: Two helpers  FIM - Toileting Toileting: 0: Activity did not occur  FIM - Archivist Transfers: 0-Activity did not occur  FIM - Banker Devices: Sliding board Bed/Chair Transfer: 2: Chair or W/C > Bed: Max A (lift and lower assist);3: Bed > Chair or W/C: Mod A (lift or lower assist);3: Sit > Supine: Mod A (lifting assist/Pt. 50-74%/lift 2 legs) (uneven transfer on soft ADL apartment bed)  FIM - Locomotion: Wheelchair Distance: 150 Locomotion: Wheelchair: 6: Travels 150 ft or more, turns around, maneuvers to table, bed or toilet, negotiates 3% grade: maneuvers on rugs and over door sills independently FIM - Locomotion: Ambulation Locomotion: Ambulation Assistive Devices: Other (comment) (currently paraplegic) Locomotion: Ambulation: 0: Activity did not occur  Comprehension Comprehension Mode: Auditory Comprehension: 6-Follows complex conversation/direction: With extra time/assistive device  Expression Expression Mode: Verbal Expression Assistive Devices: 6-Other (Comment) Expression:  6-Expresses complex ideas: With extra time/assistive device  Social Interaction Social Interaction: 6-Interacts appropriately with others with medication or extra time (anti-anxiety, antidepressant).  Problem Solving Problem Solving Mode: Asleep Problem Solving: 6-Solves complex problems: With extra time  Memory Memory Mode: Asleep Memory: 6-More than reasonable amt of time  2. Anticoagulation/DVT prophylaxis with Pharmaceutical: No lovenox yet continue SCD 3. Pain Management: oxycodone  Medical Problem List and Plan:  1. 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 DVT Prophylaxis/Anticoagulation: SCDs. . Monitor for any signs of deep vein thrombosis. Dopplers negative  3. Pain management. Lyrica 75 mg twice daily, oxycodone as needed. Monitor with increased activity. Patient complains generally of mild to moderate low back pain and denies dysesthesias in the lower extremities.  4. Neurogenic bowel:   -getting toward daily to qod/qd routine. Getting up  to bsc now. 5. Diabetes mellitus. Hemoglobin A1c of 10.2. Will titrate metformin upward  Patient on metformin pta.  cbg's showing improvement. 6. Hyperlipidemia. Crestor 7. Neurogenic bladder: foley for now.  Will d/w team regarding IO cathing trial LOS (Days) 11 A FACE TO FACE EVALUATION WAS PERFORMED  John Meza T 12/01/2011, 6:46 AM

## 2011-12-01 NOTE — Progress Notes (Signed)
Physical Therapy Session Note  Patient Details  Name: John Meza MRN: [id number] Date of Birth: [birth year]  Today's Date: 12/01/2011 Time: 1440-1535 Time Calculation (min): 55 min  Short Term Goals: Week 2:  PT Short Term Goal 1 (Week 2): Pt will be able to transfer with min A using slide board PT Short Term Goal 2 (Week 2): Pt will be able to demonstrate dynamic sitting balance with min A for funtional tasks PT Short Term Goal 3 (Week 2): Pt will be independent with calling for pressure relief when OOB in w/c  Skilled Therapeutic Interventions/Progress Updates: Treatment session focused on functional transfers with slide board with wife assisting for transfer, good carryover with practice. Pt worked on dynamic sitting balance and trunk control seated edge of mat to throw and catch ball and shoot at basketball hoop, min A overall. Good self correction of balance noted. W/c mobility practiced up/down ramp with close supervision for home entry. Demonstrated to wife how to bump w/c up/down curb step, verbalized understanding. Did not return demo due to weight of pt and not comfortable but verbalized understanding of technique.      Therapy Documentation Precautions:  Precautions Precautions: Back Precaution Booklet Issued: No Precaution Comments:  (0/5 B LE MMT) Required Braces or Orthoses: Yes Spinal Brace: Thoracolumbosacral orthotic;Applied in supine position Other Brace/Splint: abdominal binder Restrictions Weight Bearing Restrictions: No   Pain:  Denies pain  See FIM for current functional status  Therapy/Group: Individual Therapy  Karolee Stamps [facility] 12/01/2011, 4:26 PM

## 2011-12-02 DIAGNOSIS — W11XXXA Fall on and from ladder, initial encounter: Secondary | ICD-10-CM

## 2011-12-02 DIAGNOSIS — N319 Neuromuscular dysfunction of bladder, unspecified: Secondary | ICD-10-CM

## 2011-12-02 DIAGNOSIS — IMO0002 Reserved for concepts with insufficient information to code with codable children: Secondary | ICD-10-CM

## 2011-12-02 DIAGNOSIS — K592 Neurogenic bowel, not elsewhere classified: Secondary | ICD-10-CM

## 2011-12-02 DIAGNOSIS — Z5189 Encounter for other specified aftercare: Secondary | ICD-10-CM

## 2011-12-02 LAB — GLUCOSE, CAPILLARY
Glucose-Capillary: 131 mg/dL — ABNORMAL HIGH (ref 70–99)
Glucose-Capillary: 135 mg/dL — ABNORMAL HIGH (ref 70–99)

## 2011-12-02 NOTE — Progress Notes (Signed)
Physical Therapy Session Note  Patient Details  Name: Chukwuma Straus MRN: [id number] Date of Birth: December 22, 1960  Today's Date: 12/02/2011 Time: 1400-1500 Time Calculation (min): 60 min  Short Term Goals: Week 2:  PT Short Term Goal 1 (Week 2): Pt will be able to transfer with min A using slide board PT Short Term Goal 2 (Week 2): Pt will be able to demonstrate dynamic sitting balance with min A for funtional tasks PT Short Term Goal 3 (Week 2): Pt will be independent with calling for pressure relief when OOB in w/c  Skilled Therapeutic Interventions/Progress Updates: Focused on bed mobility retraining for supine <-> sit and rolling overall min/mod A. Reviewed lower extremity ROM and stretching in supine and sidelying with pt and pts wife. Min A slide board transfers to/from mat and w/c. Therapeutic activity for bowling from w/c and picking up pins and ball for functional mobility and strengthening supervision level.     Therapy Documentation Precautions:  Precautions Precautions: Back Precaution Booklet Issued: No Precaution Comments:  (0/5 B LE MMT) Required Braces or Orthoses: Yes Spinal Brace: Thoracolumbosacral orthotic;Applied in supine position Other Brace/Splint: abdominal binder Restrictions Weight Bearing Restrictions: No   Pain:  c/o back pain end of session. Notified RN  See FIM for current functional status  Therapy/Group: Individual Therapy  Karolee Stamps [facility] 12/02/2011, 3:13 PM

## 2011-12-02 NOTE — Patient Care Conference (Signed)
Inpatient RehabilitationTeam Conference Note Date: 12/01/2011   Time: 2:00 PM    Patient Name: John Meza      Medical Record Number: [id number]  Date of Birth: [birth year] Sex: Male         Room/Bed: 4001/4001-01 Payor Info: Payor: [employer]  Plan: [employer]  Product Type: *No Product type*     Admitting Diagnosis: SCI - Para  Admit Date/Time:  11/20/2011  3:20 PM Admission Comments: No comment available   Primary Diagnosis:  SCI (spinal cord injury) Principal Problem: SCI (spinal cord injury)  Patient Active Problem List  Diagnoses Date Noted  . SCI (spinal cord injury) 11/20/2011  . 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 Concussion with brief LOC 11/16/2011  . Occipital scalp laceration 11/16/2011  . Accidental fall from ladder 11/16/2011  . DM, UNCOMPLICATED, TYPE II, UNCONTROLLED 05/26/2007  . OBESITY, MODERATE 05/26/2007  . HYPERLIPIDEMIA 05/25/2007  . ALLERGIC RHINITIS 05/25/2007  . DYSPEPSIA 05/25/2007    Expected Discharge Date: Expected Discharge Date: 12/11/11  Team Members Present: Physician: Dr. Faith Rogue Case Manager Present: Melanee Spry, RN Social Worker Present: Amada Jupiter, LCSW Nurse Present: Daryll Brod, RN PT Present: Karolee Stamps, PT OT Present: Mackie Pai, OT;Patricia Mat Carne, OT SLP Present: Feliberto Gottron, SLP Other (Discipline and Name): Tora Duck, PPS Coordinator        Mal Misty, RNCM for pt's Workers Comp     Current Status/Progress Goal Weekly Team Focus  Medical   no neurological changes.  bowel program coming into place. still with foley  i and o cath plan. adequate pain control  continue the above   Bowel/Bladder   Managed bowel program. Foley to straight drain  Managed bowel and bladder  Continue bowel program, education with wife   Swallow/Nutrition/ Hydration             ADL's   bathing - min A, UB dsg - S, LB dsg - tot A, Transfers Min assist with slide board  overall min assist  bed mobility, UE/UB  strengthening, ADL transfers, family education   Mobility   min/mod A slide board transfers and bed mobility, supervision to mod I w/c mobility, min A dynamic sitting balance  supervision transfers, min A bed mobility, mod A car transfer, supervision sitting balance, mod I w/c propulsion  transfers uneven surfaces, family ed, strengthening, bed mobility   Communication             Safety/Cognition/ Behavioral Observations            Pain   Requesting Oxy IR 10-20 mg q 6-8hrs prn  Pain <3  Offer pain medication prior to therapy   Skin   R sided incision line edemaous, Opening in gluteal fold with allevyn dressing intact  No additional skin breakdown  Monitor incision line, and opening to gluteal fold for appropriate healing      *See Interdisciplinary Assessment and Plan and progress notes for long and short-term goals  Barriers to Discharge: profound neuro deficits    Possible Resolutions to Barriers:  adaptive equipment    Discharge Planning/Teaching Needs:  home with wife and family to provide 24/7 assistance      Team Discussion: Pain managed.  Mood good.  Bowel program progressing.  Plan to d/c foley soon for voiding trial.  Pt does ADLs bed level--motivated to be as independent as possible.  He is min assist w/ transfers w/ supervision/set up goal.  Home eval sponsored by Workers Comp has been [employer]  report.   Ongoing family ed w/ wife.   Revisions to Treatment Plan: none    Continued Need for Acute Rehabilitation Level of Care: The patient requires daily medical management by a physician with specialized training in physical medicine and rehabilitation for the following conditions: Daily direction of a multidisciplinary physical rehabilitation program to ensure safe treatment while eliciting the highest outcome that is of practical value to the patient.: Yes Daily medical management of patient stability for increased activity during participation in an intensive  rehabilitation regime.: Yes Daily analysis of laboratory values and/or radiology reports with any subsequent need for medication adjustment of medical intervention for : Post surgical problems;Neurological problems;Other  Brock Ra 12/02/2011, 9:47 AM

## 2011-12-02 NOTE — Progress Notes (Signed)
Patient Details  Name: Adarius Tigges MRN: [id number] Date of Birth: 12/17/1960  Today's Date: 12/02/2011 Time:1130-12 Skilled Therapeutic Interventions/Progress Updates: Sat EOM for modified tennis activity working on dynamic sitting balance, activity tolerance and UE strength.  Pt with c/o left shoulder/arm pain, RN made aware and to address.  Therapy/Group: Other: co-treat with PT  Activity Level: Moderate:  Level of assist:Close Supervision  Lennon Richins 12/02/2011, 12:32 PM

## 2011-12-02 NOTE — Progress Notes (Signed)
Occupational Therapy Session Notes  Patient Details  Name: John Meza MRN: [id number] Date of Birth: [birth year]  Today's Date: 12/02/2011  Skilled Therapeutic Interventions/Progress Updates:   Session #1 0930-1030 - 60 Minutes Individual Therapy No complaints of pain Upon entering room patient supine with suppository inserted. Engaged in bed mobility for donning TLSO and edge of bed -> BSC transfer using slide board. Wife performed transfer with minimal assistance from therapist. Nursing present for bowel program; digital stimulation. BSC -> edge of bed slide board transfer with min assist from wife. Once back in bed focused on bed mobility, doffing/donning TLSO, UB/LB bathing & dressing, and edge of bed -> w/c slide board transfer (wife performed without help from therapist). Left patient in room to perform grooming tasks as needed/wanted.   Session #2 1300-1400 - 60 Minutes Individual Therapy No complaints of pain Had patient propel self off unit, engaging in getting on/off elevator & pushing buttons and furniture transfers w/c <-> couch. W/c -> couch with min assist from wife using slide board, couch -> w/c transfer total assist X2 secondary to soft couch. Wife stated couch was similar to one at home and stated she plans on getting a new couch so patient can safely transfer <-> with one person. Recommend taller, firmer couch for home. Also engaged in tub shower transfer using tub transfer bench and slide board. Patient mod assist from transfer <-> tub transfer bench. Used grab bars prn. Recommend tub transfer bench, grab bars, and hand-held shower at home. Discussed recommendations with patient and wife.   Precautions:  Precautions Precautions: Back Precaution Booklet Issued: No Precaution Comments:  (0/5 B LE MMT) Required Braces or Orthoses: Yes Spinal Brace: Thoracolumbosacral orthotic;Applied in supine position Other Brace/Splint: abdominal binder Restrictions Weight Bearing  Restrictions: No  See FIM for current functional status  Nabila Albarracin 12/02/2011, 10:15 AM

## 2011-12-02 NOTE — Progress Notes (Signed)
Patient ID: John Meza, male   DOB: 1961/09/26, 51 y.o.   MRN: [id number] Pt & wife aware d/c date 12/11/11.  Wife is staying w/ pt in hospital & is being included w/ therapies as appropriate to be educated about pt's care.  Pt is motivated to be as independent as possible.  Wife has begun to learn bowel program.   Foley to be d/c'd today to begin voiding trial/I&O cath education.  Home eval completed--await report.

## 2011-12-02 NOTE — Progress Notes (Signed)
Patient ID: John Meza, male   DOB: 01-Oct-1961, 51 y.o.   MRN: [id number]  Subjective/Complaints:  Review of Systems  Unable to perform ROS: language  2/20- no new complaints.  Good night Objective: Vital Signs: Blood pressure 95/56, pulse 76, temperature 98.6 F (37 C), temperature source Oral, resp. rate 18, weight 83.825 kg (184 lb 12.8 oz), SpO2 98.00%. No results found. Results for orders placed during the hospital encounter of 11/20/11 (from the past 72 hour(s))  GLUCOSE, CAPILLARY     Status: Abnormal   Collection Time   11/29/11  7:15 AM      Component Value Range Comment   Glucose-Capillary 120 (*) 70 - 99 (mg/dL)   GLUCOSE, CAPILLARY     Status: Abnormal   Collection Time   11/29/11 12:14 PM      Component Value Range Comment   Glucose-Capillary 164 (*) 70 - 99 (mg/dL)   GLUCOSE, CAPILLARY     Status: Abnormal   Collection Time   11/29/11  4:32 PM      Component Value Range Comment   Glucose-Capillary 154 (*) 70 - 99 (mg/dL)   GLUCOSE, CAPILLARY     Status: Abnormal   Collection Time   11/29/11  8:07 PM      Component Value Range Comment   Glucose-Capillary 189 (*) 70 - 99 (mg/dL)    Comment 1 Notify RN     GLUCOSE, CAPILLARY     Status: Abnormal   Collection Time   11/30/11  8:16 AM      Component Value Range Comment   Glucose-Capillary 234 (*) 70 - 99 (mg/dL)    Comment 1 Notify RN     GLUCOSE, CAPILLARY     Status: Abnormal   Collection Time   11/30/11 12:18 PM      Component Value Range Comment   Glucose-Capillary 156 (*) 70 - 99 (mg/dL)    Comment 1 Notify RN     GLUCOSE, CAPILLARY     Status: Abnormal   Collection Time   11/30/11  4:29 PM      Component Value Range Comment   Glucose-Capillary 186 (*) 70 - 99 (mg/dL)    Comment 1 Notify RN     GLUCOSE, CAPILLARY     Status: Abnormal   Collection Time   11/30/11  9:44 PM      Component Value Range Comment   Glucose-Capillary 163 (*) 70 - 99 (mg/dL)    Comment 1 Notify RN     GLUCOSE, CAPILLARY      Status: Abnormal   Collection Time   12/01/11  7:28 AM      Component Value Range Comment   Glucose-Capillary 135 (*) 70 - 99 (mg/dL)    Comment 1 Notify RN     GLUCOSE, CAPILLARY     Status: Abnormal   Collection Time   12/01/11 11:58 AM      Component Value Range Comment   Glucose-Capillary 145 (*) 70 - 99 (mg/dL)    Comment 1 Notify RN     GLUCOSE, CAPILLARY     Status: Abnormal   Collection Time   12/01/11  9:35 PM      Component Value Range Comment   Glucose-Capillary 164 (*) 70 - 99 (mg/dL)    Comment 1 Notify RN      Physical Exam  Nursing note and vitals reviewed.  Constitutional: He is oriented to person, place, and time. He appears well-developed and well-nourished.  HENT:  Head: Normocephalic.  Eyes: Pupils are equal, round, and reactive to light. EOMI Neck: Normal range of motion. Neck supple.  Cardiovascular: Normal rate.  Pulmonary/Chest:  Decreased breath sounds RLL. Dyspnea with deep inspiration.  Abdominal: Soft. Bowel sounds are normal. He exhibits no distension. There is no tenderness.  Musculoskeletal: He exhibits no edema.  Neurological: He is alert and oriented to person, place, and time. He displays abnormal reflex.   He has 0 out of 5 strength in both lower extremities. Areflexic-BLE. Follows commands  Upper extremity motor  intact.  There is a language barrier. No sensation below the level of injury. Cognitively intact. Skin: Skin is warm and dry.  Wound is clean and intact with steristrips and no drainage. Psychiatric: He has a normal mood and affect. His behavior is normal.  Ext:  No swelling or edema in BLE, no discoloration 2/20--exam Assessment/Plan: 1. 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 Physiatrist is providing close team supervision and 24 hour management of active medical problems  listed below. Physiatrist and rehab team continue to assess barriers to discharge/monitor patient progress toward functional and medical goals. FIM: FIM - Bathing Bathing Steps Patient Completed: Chest;Right Arm;Left Arm;Front perineal area;Buttocks;Right upper leg;Left upper leg;Abdomen;Right lower leg (including foot);Left lower leg (including foot) Bathing: 5: Set-up assist to: Obtain items  FIM - Upper Body Dressing/Undressing Upper body dressing/undressing steps patient completed: Thread/unthread right sleeve of pullover shirt/dresss;Thread/unthread left sleeve of pullover shirt/dress;Put head through opening of pull over shirt/dress;Pull shirt over trunk Upper body dressing/undressing: 5: Set-up assist to: Obtain clothing/put away FIM - Lower Body Dressing/Undressing Lower body dressing/undressing steps patient completed: Thread/unthread right pants leg;Thread/unthread left pants leg;Pull pants up/down Lower body dressing/undressing: 3: Mod-Patient completed 50-74% of tasks  FIM - Toileting Toileting: 0: Activity did not occur  FIM - Archivist Transfers: 0-Activity did not occur  FIM - Banker Devices: Sliding board Bed/Chair Transfer: 5: Supine > Sit: Supervision (verbal cues/safety issues);4: Bed > Chair or W/C: Min A (steadying Pt. > 75%)  FIM - Locomotion: Wheelchair Distance: 150 Locomotion: Wheelchair: 6: Travels 150 ft or more, turns around, maneuvers to table, bed or toilet, negotiates 3% grade: maneuvers on rugs and over door sills independently FIM - Locomotion: Ambulation Locomotion: Ambulation Assistive Devices: Other (comment) (currently paraplegic) Locomotion: Ambulation: 0: Activity did not occur  Comprehension Comprehension Mode: Auditory Comprehension: 6-Follows complex conversation/direction: With extra time/assistive device  Expression Expression Mode: Verbal Expression Assistive Devices: 6-Other  (Comment) Expression: 6-Expresses complex ideas: With extra time/assistive device  Social Interaction Social Interaction: 6-Interacts appropriately with others with medication or extra time (anti-anxiety, antidepressant).  Problem Solving Problem Solving Mode: Asleep Problem Solving: 6-Solves complex problems: With extra time  Memory Memory Mode: Asleep Memory: 6-More than reasonable amt of time  2. Anticoagulation/DVT prophylaxis with Pharmaceutical: No lovenox yet continue SCD 3. Pain Management: oxycodone  Medical Problem List and Plan:  1. 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 DVT Prophylaxis/Anticoagulation: SCDs. . Monitor for any signs of deep vein thrombosis. Dopplers negative  3. Pain management. Lyrica 75 mg twice daily, oxycodone as needed. Monitor with increased activity. Patient complains generally of mild to moderate low back pain and denies dysesthesias in the lower extremities.  4. Neurogenic bowel:   -getting toward daily to qod/qd routine. Getting up to bsc now. 5. Diabetes mellitus.  Hemoglobin A1c of 10.2. Metformin increased and numbers are better.  -continue lantus 6. Hyperlipidemia. Crestor 7. Neurogenic bladder: remove foley today and begin IC trial LOS (Days) 12 A FACE TO FACE EVALUATION WAS PERFORMED  Sequita Wise T 12/02/2011, 6:56 AM

## 2011-12-02 NOTE — Progress Notes (Signed)
Physical Therapy Session Note  Patient Details  Name: John Meza MRN: [id number] Date of Birth: [birth year]  Today's Date: 12/02/2011 Time: 1100-1200 Time Calculation (min): 60 min  Short Term Goals: Week 2:  PT Short Term Goal 1 (Week 2): Pt will be able to transfer with min A using slide board PT Short Term Goal 2 (Week 2): Pt will be able to demonstrate dynamic sitting balance with min A for funtional tasks PT Short Term Goal 3 (Week 2): Pt will be independent with calling for pressure relief when OOB in w/c  Skilled Therapeutic Interventions/Progress Updates: Focus on family education with pt's wife and daughters for transfer training using slide board and education on safety with mobility/balance deficits, and body mechanics. Encouraged family to allow pt to do as much as possible and be there for safety or if he needs assist, but not to just lift him - verbalized understanding and in agreement. Dynamic sitting balance activity edge of mat to work on trunk control and balance reactions to play tennis. Required overall steady A.     Therapy Documentation Precautions:  Precautions Precautions: Back Precaution Booklet Issued: No Precaution Comments:  (0/5 B LE MMT) Required Braces or Orthoses: Yes Spinal Brace: Thoracolumbosacral orthotic;Applied in supine position Other Brace/Splint: abdominal binder Restrictions Weight Bearing Restrictions: No   Pain:  Denies back pain. Complaint of L shoulder pain (reports it has been there since the fall but hurting more now that he uses his arms a lot)  See FIM for current functional status  Therapy/Group: Individual Therapy and Co-Treatment (30 minutes with Recreational Therapy)  Karolee Stamps [facility] 12/02/2011, 12:03 PM

## 2011-12-03 LAB — GLUCOSE, CAPILLARY
Glucose-Capillary: 133 mg/dL — ABNORMAL HIGH (ref 70–99)
Glucose-Capillary: 103 mg/dL — ABNORMAL HIGH (ref 70–99)

## 2011-12-03 NOTE — Progress Notes (Signed)
Patient ID: John Meza, male   DOB: 08-12-1961, 51 y.o.   MRN: [id number] Patient ID: John Meza, male   DOB: [birth year], 51 y.o.   MRN: [id number]  Subjective/Complaints:  Review of Systems  Unable to perform ROS: language  2/21- no problems reported. Foley out. Cath volume 625cc Objective: Vital Signs: Blood pressure 102/60, pulse 64, temperature 98.8 F (37.1 C), temperature source Oral, resp. rate 19, weight 82.2 kg (181 lb 3.5 oz), SpO2 96.00%. No results found. Results for orders placed during the hospital encounter of 11/20/11 (from the past 72 hour(s))  GLUCOSE, CAPILLARY     Status: Abnormal   Collection Time   11/30/11  8:16 AM      Component Value Range Comment   Glucose-Capillary 234 (*) 70 - 99 (mg/dL)    Comment 1 Notify RN     GLUCOSE, CAPILLARY     Status: Abnormal   Collection Time   11/30/11 12:18 PM      Component Value Range Comment   Glucose-Capillary 156 (*) 70 - 99 (mg/dL)    Comment 1 Notify RN     GLUCOSE, CAPILLARY     Status: Abnormal   Collection Time   11/30/11  4:29 PM      Component Value Range Comment   Glucose-Capillary 186 (*) 70 - 99 (mg/dL)    Comment 1 Notify RN     GLUCOSE, CAPILLARY     Status: Abnormal   Collection Time   11/30/11  9:44 PM      Component Value Range Comment   Glucose-Capillary 163 (*) 70 - 99 (mg/dL)    Comment 1 Notify RN     GLUCOSE, CAPILLARY     Status: Abnormal   Collection Time   12/01/11  7:28 AM      Component Value Range Comment   Glucose-Capillary 135 (*) 70 - 99 (mg/dL)    Comment 1 Notify RN     GLUCOSE, CAPILLARY     Status: Abnormal   Collection Time   12/01/11 11:58 AM      Component Value Range Comment   Glucose-Capillary 145 (*) 70 - 99 (mg/dL)    Comment 1 Notify RN     GLUCOSE, CAPILLARY     Status: Abnormal   Collection Time   12/01/11  9:35 PM      Component Value Range Comment   Glucose-Capillary 164 (*) 70 - 99 (mg/dL)    Comment 1 Notify RN     GLUCOSE, CAPILLARY     Status: Normal   Collection Time   12/02/11  7:15 AM      Component Value Range Comment   Glucose-Capillary 78  70 - 99 (mg/dL)    Comment 1 Notify RN     GLUCOSE, CAPILLARY     Status: Abnormal   Collection Time   12/02/11 12:00 PM      Component Value Range Comment   Glucose-Capillary 108 (*) 70 - 99 (mg/dL)   GLUCOSE, CAPILLARY     Status: Abnormal   Collection Time   12/02/11  4:58 PM      Component Value Range Comment   Glucose-Capillary 131 (*) 70 - 99 (mg/dL)    Comment 1 Notify RN     GLUCOSE, CAPILLARY     Status: Abnormal   Collection Time   12/02/11  9:07 PM      Component Value Range Comment   Glucose-Capillary 135 (*) 70 - 99 (mg/dL)    Physical Exam  Nursing  note and vitals reviewed.  Constitutional: He is oriented to person, place, and time. He appears well-developed and well-nourished.  HENT:  Head: Normocephalic.  Eyes: Pupils are equal, round, and reactive to light. EOMI Neck: Normal range of motion. Neck supple.  Cardiovascular: Normal rate.  Pulmonary/Chest:  Decreased breath sounds RLL. Dyspnea with deep inspiration.  Abdominal: Soft. Bowel sounds are normal. He exhibits no distension. There is no tenderness.  Musculoskeletal: He exhibits no edema.  Neurological: He is alert and oriented to person, place, and time. He displays abnormal reflex.   He has 0 out of 5 strength in both lower extremities. Areflexic-BLE. Follows commands  Upper extremity motor  intact.  There is a language barrier. No sensation below the level of injury. Cognitively intact. Skin: Skin is warm and dry.  Wound is clean and intact with steristrips and no drainage. Psychiatric: He has a normal mood and affect. His behavior is normal.  Ext:  No swelling or edema in BLE, no discoloration 2/21-exam Assessment/Plan: 1. 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 Physiatrist is providing close team supervision and 24 hour management of active medical problems listed below. Physiatrist and rehab team continue to assess barriers to discharge/monitor patient progress toward functional and medical goals. FIM: FIM - Bathing Bathing Steps Patient Completed: Chest;Right Arm;Left Arm;Front perineal area;Abdomen;Right upper leg;Left upper leg;Right lower leg (including foot);Left lower leg (including foot) Bathing: 4: Min-Patient completes 8-9 81f 10 parts or 75+ percent (secondary to BM)  FIM - Upper Body Dressing/Undressing Upper body dressing/undressing steps patient completed: Thread/unthread right sleeve of pullover shirt/dresss;Thread/unthread left sleeve of pullover shirt/dress;Put head through opening of pull over shirt/dress;Pull shirt over trunk Upper body dressing/undressing: 5: Set-up assist to: Obtain clothing/put away FIM - Lower Body Dressing/Undressing Lower body dressing/undressing steps patient completed: Thread/unthread right pants leg;Thread/unthread left pants leg;Pull pants up/down Lower body dressing/undressing: 1: Total-Patient completed less than 25% of tasks (secondary to time)  FIM - Toileting Toileting: 0: Activity did not occur  FIM - Diplomatic Services operational officer Devices: Human resources officer Transfers DO NOT USE: 4-To toilet/BSC: Min A (steadying Pt. > 75%)  FIM - Banker Devices: Sliding board;Bed rails;Arm rests Bed/Chair Transfer: 4: Supine > Sit: Min A (steadying Pt. > 75%/lift 1 leg);4: Chair or W/C > Bed: Min A (steadying Pt. > 75%);3: Sit > Supine: Mod A (lifting assist/Pt. 50-74%/lift 2 legs)  FIM - Locomotion: Wheelchair Distance: 150 Locomotion: Wheelchair: 6: Travels 150 ft or more, turns around, maneuvers to table, bed or toilet, negotiates 3% grade: maneuvers on rugs and over door sills independently FIM - Locomotion:  Ambulation Locomotion: Ambulation Assistive Devices: Other (comment) (currently paraplegic) Locomotion: Ambulation: 0: Activity did not occur  Comprehension Comprehension Mode: Auditory Comprehension: 6-Follows complex conversation/direction: With extra time/assistive device  Expression Expression Mode: Verbal Expression Assistive Devices: 6-Other (Comment) Expression: 6-Expresses complex ideas: With extra time/assistive device  Social Interaction Social Interaction: 6-Interacts appropriately with others with medication or extra time (anti-anxiety, antidepressant).  Problem Solving Problem Solving Mode: Asleep Problem Solving: 6-Solves complex problems: With extra time  Memory Memory Mode: Asleep Memory: 6-More than reasonable amt of time  2. Anticoagulation/DVT prophylaxis with Pharmaceutical: No lovenox yet continue SCD 3. Pain Management: oxycodone  Medical Problem List and Plan:  1. T.-11 fracture after fall with complete spinal cord injury  2.[name]  DVT Prophylaxis/Anticoagulation: SCDs. . Monitor for any signs of deep vein thrombosis. Dopplers negative  3. Pain management. Lyrica 75 mg twice daily, oxycodone as needed. Monitor with increased activity. Patient complains generally of mild to moderate low back pain and denies dysesthesias in the lower extremities.  4. Neurogenic bowel:   -getting toward daily to qod/qd routine. Getting up to bsc now. 5. Diabetes mellitus. Hemoglobin A1c of 10.2. Metformin increased and numbers are better.  -continue lantus 6. Hyperlipidemia. Crestor 7. Neurogenic bladder: IC program to keep volumes between 300 and 500 cc   -doubt he'll be able to spontaneously void. LOS (Days) 13 A FACE TO FACE EVALUATION WAS PERFORMED  Loxley Schmale T 12/03/2011, 6:31 AM

## 2011-12-03 NOTE — Progress Notes (Signed)
Occupational Therapy Session Notes  Patient Details  Name: Quandre Polinski MRN: [id number] Date of Birth: 02-May-1961  Today's Date: 12/03/2011  Skilled Therapeutic Interventions/Progress Updates  Session #1 1000-1100 - 60 Minutes Individual Therapy No complaints of pain Engaged in ADL retraining at bed level. Focused on UB/LB bathing & dressing in supine position, bed mobility, overall activity tolerance/endurance, edge of bed -> w/c slide board transfer with mod assist secondary to new chair. Wife mainly assisting when needed for set-up and physical assistance.   Session #2 1315-1400 - 45 Minutes Individual Therapy No complaints of pain Therapeutic activity focusing on UE/UB strengthening with and without weights, dynamic sitting balance/tolerance/endurance, and transfers using slide board (wife performing min assist transfers with patient). Also contacted [employer] for information and education in spanish regarding SCIs. Gave patient [employer] back pack in english, daughter will be able to translate information from back pack.   Precautions:  Precautions Precautions: Back Precaution Booklet Issued: No Precaution Comments:  (0/5 B LE MMT) Required Braces or Orthoses: Yes Spinal Brace: Thoracolumbosacral orthotic;Applied in supine position Other Brace/Splint: abdominal binder Restrictions Weight Bearing Restrictions: No  See FIM for current functional status  Demika Langenderfer 12/03/2011, 10:31 AM

## 2011-12-03 NOTE — Progress Notes (Signed)
Physical Therapy Session Note  Patient Details  Name: [name] MRN: [id number] Date of Birth: Jul 24, 1961  Today's Date: 12/03/2011 Time: 1400-1455 (55 minutes)  Short Term Goals: Week 2:  PT Short Term Goal 1 (Week 2): Pt will be able to transfer with min A using slide board PT Short Term Goal 2 (Week 2): Pt will be able to demonstrate dynamic sitting balance with min A for funtional tasks PT Short Term Goal 3 (Week 2): Pt will be independent with calling for pressure relief when OOB in w/c  Skilled Therapeutic Interventions/Progress Updates: Therapeutic activity to bounce basketball back and forth seated edge of mat without UE support and play Wii Tennis with focus on dynamic sitting balance and trunk control for balance reactions. Pt completed at overall close supervision level. Demonstrated and return demonstrated scapular retraction exercise with pt & pt wife for posture and scapular strengthening x 15 reps.     Therapy Documentation Precautions:  Precautions Precautions: Back Precaution Booklet Issued: No Precaution Comments:  (0/5 B LE MMT) Required Braces or Orthoses: Yes Spinal Brace: Thoracolumbosacral orthotic;Applied in supine position Other Brace/Splint: abdominal binder Restrictions Weight Bearing Restrictions: No Pain: Denies pain  See FIM for current functional status  Therapy/Group: Individual Therapy  Karolee Stamps [facility] 12/03/2011, 2:54 PM

## 2011-12-03 NOTE — Progress Notes (Signed)
Physical Therapy Session Note  Patient Details  Name: John Meza MRN: [id number] Date of Birth: August 11, 1961  Today's Date: 12/03/2011 Time: 1100-1156 Time Calculation (min): 56 min  Short Term Goals: Week 2:  PT Short Term Goal 1 (Week 2): Pt will be able to transfer with min A using slide board PT Short Term Goal 2 (Week 2): Pt will be able to demonstrate dynamic sitting balance with min A for funtional tasks PT Short Term Goal 3 (Week 2): Pt will be independent with calling for pressure relief when OOB in w/c  Skilled Therapeutic Interventions/Progress Updates: Pt with trial lightweight w/c from vendor, pt states he likes it. W/c mobility on unit in controlled and home environment with supervision. Transfer to/from mat to/from w/c with min A overall, cueing for lower extremity management with fixed foot rests. Circle sitting to work on ADL task to put on shoes, required assist to push foot into shoe but overall did very well with task using leg loops to manage lower extremities. Discussed real car transfer now that pt has trial w/c that he will d/c with, tentatively planning for tomorrow.     Therapy Documentation Precautions:  Precautions Precautions: Back Precaution Booklet Issued: No Precaution Comments:  (0/5 B LE MMT) Required Braces or Orthoses: Yes Spinal Brace: Thoracolumbosacral orthotic;Applied in supine position Other Brace/Splint: abdominal binder Restrictions Weight Bearing Restrictions: No  Pain: c/o of L shoulder pain - notified RN, premedicated.  See FIM for current functional status  Therapy/Group: Individual Therapy  Karolee Stamps [facility] 12/03/2011, 11:59 AM

## 2011-12-03 NOTE — Progress Notes (Signed)
Pt's wife was instructed about self cath.pt. Was scanned and then cath.pt. Tolerated fine.

## 2011-12-03 NOTE — Progress Notes (Signed)
Pt. Is on Bowel training.supository was put in and two minutes after that, it come off with out dissolving completely, follow up for form soft stools.Pt. Was place on bed pan and ten minutes after he was clean.

## 2011-12-03 NOTE — Progress Notes (Addendum)
Patient had an unplanned episode of bowel per report of nurse tech. Moderate amount of soft brown stool.  Bowel program is currently scheduled for 0800 am. Per report from previous nurse foley removed at 1330 pm, scanned bladder at 2130 pm for 496 and in and out cath for 625 ml. Will continue to monitor patient.

## 2011-12-04 ENCOUNTER — Inpatient Hospital Stay (HOSPITAL_COMMUNITY): Payer: Worker's Compensation

## 2011-12-04 LAB — GLUCOSE, CAPILLARY
Glucose-Capillary: 82 mg/dL (ref 70–99)
Glucose-Capillary: 179 mg/dL — ABNORMAL HIGH (ref 70–99)

## 2011-12-04 MED ORDER — ENOXAPARIN SODIUM 30 MG/0.3ML ~~LOC~~ SOLN
30.0000 mg | Freq: Two times a day (BID) | SUBCUTANEOUS | Status: DC
  Administered 2011-12-04 – 2011-12-11 (×15): 30 mg via SUBCUTANEOUS
  Filled 2011-12-04 (×20): qty 0.3

## 2011-12-04 MED ORDER — CALCIUM POLYCARBOPHIL 625 MG PO TABS
625.0000 mg | ORAL_TABLET | Freq: Every day | ORAL | Status: DC
  Administered 2011-12-04 – 2011-12-11 (×8): 625 mg via ORAL
  Filled 2011-12-04 (×11): qty 1

## 2011-12-04 NOTE — Plan of Care (Signed)
Problem: SCI BLADDER ELIMINATION Goal: RH STG MANAGE BLADDER WITH EQUIPMENT WITH ASSISTANCE STG Manage Bladder With Equipment With Mod Assistance  Outcome: Progressing 12/04/11.continue to .allow pt to self cath q 6-8hrs. Education pt regarding fluid restrictions after 1900

## 2011-12-04 NOTE — Progress Notes (Signed)
Pt given suppository at 0830. (Pt requested that suppository be given after completion of breakfast). Transferred to bedside commode after suppository. Large, soft stool at 0845. Dig stim necessary to facility emptying. While pt was using arms to push up on drop arm commode. Left side of drop arm, which pt's wife had locked, gave way. Pt now complains of discomfort to left side when breathing. Vital signs WNL. Oxy IR 10mg  given for discomfort. Dinah Beers, PA aware. Order received for a K-pad. Education regarding I&O cath initiated today. Pt self-cath x 1 today. Pt's wife  to return demonstrate I&O catherization today as well.

## 2011-12-04 NOTE — Progress Notes (Signed)
Subjective/Complaints:  Review of Systems  Unable to perform ROS: language  2/22- pt denies pain.  Cath volumes 600 to 800 cc  Vital Signs: Blood pressure 102/62, pulse 75, temperature 98.9 F (37.2 C), temperature source Oral, resp. rate 20, weight 82.2 kg (181 lb 3.5 oz), SpO2 96.00%. No results found. Results for orders placed during the hospital encounter of 11/20/11 (from the past 72 hour(s))  GLUCOSE, CAPILLARY     Status: Abnormal   Collection Time   12/01/11  7:28 AM      Component Value Range Comment   Glucose-Capillary 135 (*) 70 - 99 (mg/dL)    Comment 1 Notify RN     GLUCOSE, CAPILLARY     Status: Abnormal   Collection Time   12/01/11 11:58 AM      Component Value Range Comment   Glucose-Capillary 145 (*) 70 - 99 (mg/dL)    Comment 1 Notify RN     GLUCOSE, CAPILLARY     Status: Abnormal   Collection Time   12/01/11  9:35 PM      Component Value Range Comment   Glucose-Capillary 164 (*) 70 - 99 (mg/dL)    Comment 1 Notify RN     GLUCOSE, CAPILLARY     Status: Normal   Collection Time   12/02/11  7:15 AM      Component Value Range Comment   Glucose-Capillary 78  70 - 99 (mg/dL)    Comment 1 Notify RN     GLUCOSE, CAPILLARY     Status: Abnormal   Collection Time   12/02/11 12:00 PM      Component Value Range Comment   Glucose-Capillary 108 (*) 70 - 99 (mg/dL)   GLUCOSE, CAPILLARY     Status: Abnormal   Collection Time   12/02/11  4:58 PM      Component Value Range Comment   Glucose-Capillary 131 (*) 70 - 99 (mg/dL)    Comment 1 Notify RN     GLUCOSE, CAPILLARY     Status: Abnormal   Collection Time   12/02/11  9:07 PM      Component Value Range Comment   Glucose-Capillary 135 (*) 70 - 99 (mg/dL)   GLUCOSE, CAPILLARY     Status: Normal   Collection Time   12/03/11  7:13 AM      Component Value Range Comment   Glucose-Capillary 79  70 - 99 (mg/dL)   GLUCOSE, CAPILLARY     Status: Abnormal   Collection Time   12/03/11 12:27 PM      Component Value Range  Comment   Glucose-Capillary 133 (*) 70 - 99 (mg/dL)    Comment 1 Notify RN     GLUCOSE, CAPILLARY     Status: Abnormal   Collection Time   12/03/11  4:38 PM      Component Value Range Comment   Glucose-Capillary 104 (*) 70 - 99 (mg/dL)    Comment 1 Notify RN     GLUCOSE, CAPILLARY     Status: Abnormal   Collection Time   12/03/11  9:08 PM      Component Value Range Comment   Glucose-Capillary 103 (*) 70 - 99 (mg/dL)    Comment 1 Notify RN      Physical Exam  Nursing note and vitals reviewed.  Constitutional: He is oriented to person, place, and time. He appears well-developed and well-nourished.  HENT:  Head: Normocephalic.  Eyes: Pupils are equal, round, and reactive to light. EOMI Neck:  Normal range of motion. Neck supple.  Cardiovascular: Normal rate.  Pulmonary/Chest:  Decreased breath sounds RLL. Dyspnea with deep inspiration.  Abdominal: Soft. Bowel sounds are normal. He exhibits no distension. There is no tenderness.  Musculoskeletal: He exhibits no edema.  Neurological: He is alert and oriented to person, place, and time. He displays abnormal reflex.   He has 0 out of 5 strength in both lower extremities. Areflexic-BLE. Follows commands  Upper extremity motor  intact.  There is a language barrier. No sensation below the level of injury. Cognitively intact. Skin: Skin is warm and dry.  Wound is clean and intact with steristrips and no drainage. i see no breakdown elsewhere Psychiatric: He has a normal mood and affect. His behavior is normal.  Ext:  No swelling or edema in BLE, no discoloration 2/22-exam Assessment/Plan: 1. 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 Physiatrist is providing close team supervision and 24 hour management of active medical problems listed below. Physiatrist and rehab team continue to  assess barriers to discharge/monitor patient progress toward functional and medical goals. FIM: FIM - Bathing Bathing Steps Patient Completed: Chest;Right Arm;Abdomen;Front perineal area;Right upper leg;Left upper leg;Right lower leg (including foot);Left Arm;Left lower leg (including foot) Bathing: 4: Min-Patient completes 8-9 9f 10 parts or 75+ percent  FIM - Upper Body Dressing/Undressing Upper body dressing/undressing steps patient completed: Thread/unthread right sleeve of pullover shirt/dresss;Thread/unthread left sleeve of pullover shirt/dress;Put head through opening of pull over shirt/dress;Pull shirt over trunk Upper body dressing/undressing: 5: Set-up assist to: Obtain clothing/put away FIM - Lower Body Dressing/Undressing Lower body dressing/undressing steps patient completed: Thread/unthread right pants leg;Thread/unthread left pants leg;Pull pants up/down Lower body dressing/undressing: 1: Total-Patient completed less than 25% of tasks  FIM - Toileting Toileting: 0: Activity did not occur  FIM - Diplomatic Services operational officer Devices: Human resources officer Transfers: 0-Activity did not occur Toilet Transfers DO NOT USE: 4-To toilet/BSC: Min A (steadying Pt. > 75%)  FIM - Banker Devices: Sliding board Bed/Chair Transfer: 4: Supine > Sit: Min A (steadying Pt. > 75%/lift 1 leg);3: Bed > Chair or W/C: Mod A (lift or lower assist)  FIM - Locomotion: Wheelchair Distance: 150 Locomotion: Wheelchair: 5: Travels 150 ft or more: maneuvers on rugs and over door sills with supervision, cueing or coaxing (new lightweight w/c) FIM - Locomotion: Ambulation Locomotion: Ambulation Assistive Devices: Other (comment) (currently paraplegic) Locomotion: Ambulation: 0: Activity did not occur  Comprehension Comprehension Mode: Auditory Comprehension: 6-Follows complex conversation/direction: With extra time/assistive  device  Expression Expression Mode: Verbal Expression Assistive Devices: 6-Other (Comment) Expression: 6-Expresses complex ideas: With extra time/assistive device  Social Interaction Social Interaction: 6-Interacts appropriately with others with medication or extra time (anti-anxiety, antidepressant).  Problem Solving Problem Solving Mode: Asleep Problem Solving: 6-Solves complex problems: With extra time  Memory Memory Mode: Asleep Memory: 6-More than reasonable amt of time   Medical Problem List and Plan:  1. T.-11 fracture after fall with complete spinal cord injury. Begin lovenox 2.. DVT Prophylaxis/Anticoagulation: SCDs. . Monitor for any signs of deep vein thrombosis. Dopplers negative  3. Pain management. Lyrica 75 mg twice daily, oxycodone as needed.   -pt continues to denies significant pain 4. Neurogenic bowel:   -getting toward daily to qod/qd routine. Getting up to bsc 5. Diabetes mellitus. Hemoglobin A1c of 10.2. Metformin increased and numbers  are better.  -continue lantus 6. Hyperlipidemia. Crestor 7. Neurogenic bladder: IC program to keep volumes between 300 and 500 cc   -may need to cath him more frequently based on yesterday's volumes.  -doubt he'll be able to spontaneously void. LOS (Days) 14 A FACE TO FACE EVALUATION WAS PERFORMED  Devan Danzer T 12/04/2011, 6:18 AM

## 2011-12-04 NOTE — Progress Notes (Signed)
Occupational Therapy Session Notes  Patient Details  Name: John Meza MRN: [id number] Date of Birth: 07-Sep-1961  Today's Date: 12/04/2011  Skilled Therapeutic Interventions/Progress Updates:  Session #1 Patient with c/o /10 pain; RN aware [phone] - 45 Minutes Individual Therapy focusing on ADL retraining at bed level in supine with HOB up/down prn, bed mobility, and activity tolerance/endurance. Wife and therapist with set-up for UB/LB bathing and UB dressing. At end of session, RN present for self cathing with education to wife and patient.  Session #2 No complaints of pain 1300-1425  - 85 Minutes Individual Therapy focusing on IADL tasks in the kitchen at w/c level. Patient and wife prepared enchiladas. Patient able to maneuver around kitchen in w/c at mod I level, but needs set-up assist for simple meal preparations. Also engaged in w/c< -> bed transfer using slide board (daughter assisting with transfer), educated patient, wife, and daughter on how to take apart w/c for transportation, and had patient's wife and daughter return demonstrate. Great carryover and fast learners. Also discussed home set-up with family and ADLs/IADLs at home. Patient, wife, and daughter agreed with recommendations.   Precautions:  Precautions Precautions: Back Precaution Booklet Issued: No Precaution Comments:  (0/5 B LE MMT) Required Braces or Orthoses: Yes Spinal Brace: Thoracolumbosacral orthotic;Applied in supine position Other Brace/Splint: abdominal binder Restrictions Weight Bearing Restrictions: No  See FIM for current functional status   [name] 12/04/2011, 11:05 AM

## 2011-12-04 NOTE — Progress Notes (Signed)
Physical Therapy Weekly Progress Note  Patient Details  Name: John Meza MRN: [id number] Date of Birth: 11/02/1960  Today's Date: 12/04/2011  Treatment #1: Time: 4742-5956 Time Calculation (min): 43 min  Treatment session focused on self dressing for lower body at bed level with some assist from pt's wife to pull up pants and put on socks. Slide board transfer with min A from bed to w/c, pt managing lower extremities with cueing. Completed self care at sink w/c level modified independent. Discussed with pt possibility of participating in a success story with picture for the unit and pt agreeable and excited to participate. Reviewed pressure relief techniques in new w/c with patient and pt's wife for lateral and anterior leans as well as w/c pushups instead of having wife "boost" w/c. Pt return demo and in agreement.   Treatment #2: Time:1430-1530 Time Calculation:60 minutes Treatment session focused on family education for real car transfer. Required 2 person assist for safety due to limited space with car door being open. Recommended having 2 people initially until family feels more comfortable with transfer. One person outside of car to the pt's side and second person on driver's seat to assist with transfer into the car. W/c mobility off unit to manage elevator and propel w/c on uneven surfaces. Dynamic sitting balance activity for horseshoe toss and reach on edge of mat steady A. Steady A with transfers with slide board as well. Pt at end of session complaint of pain in L side whenever he breaths in deep. He stated this is new for today but had not notified anyone about it. Therapist notified RN about pt's complaints. Applied ice pack to L shoulder for pain.    Patient has met 3 of 3 short term goals.  Pt is making excellent progress with therapy and pt's family is undergoing family education successfully. Pt issued loaner lightweight w/c this week and has adjusted well to it. Family educated  by vendor on breakdown of w/c for car transportation as well.   Patient continues to demonstrate the following deficits: paraplegia, decreased balance, decreased functional mobility, decreased sensation and proprioception and therefore will continue to benefit from skilled PT intervention to enhance overall performance with balance, postural control and ability to compensate for deficits.  Patient progressing toward long term goals..  Continue plan of care.  PT Short Term Goals Week 2:  PT Short Term Goal 1 (Week 2): Pt will be able to transfer with min A using slide board - Met PT Short Term Goal 2 (Week 2): Pt will be able to demonstrate dynamic sitting balance with min A for funtional tasks - Met PT Short Term Goal 3 (Week 2): Pt will be independent with calling for pressure relief when OOB in w/c- Met  Skilled Therapeutic Interventions/Progress Updates:  Balance/vestibular training;Community reintegration;DME/adaptive equipment instruction;Functional mobility training;Neuromuscular re-education;Pain management;Patient/family education;Stair training;Therapeutic Activities;Therapeutic Exercise;UE/LE Coordination activities;UE/LE Strength taining/ROM;Wheelchair propulsion/positioning   Therapy Documentation Precautions:  Precautions Precautions: Back Precaution Booklet Issued: No Precaution Comments:  (0/5 B LE MMT) Required Braces or Orthoses: Yes Spinal Brace: Thoracolumbosacral orthotic;Applied in supine position Other Brace/Splint: abdominal binder Restrictions Weight Bearing Restrictions: No Pain: No complaint of pain.   See FIM for current functional status  Therapy/Group: Individual Therapy  Karolee Stamps [facility] 12/04/2011, 12:22 PM

## 2011-12-05 LAB — GLUCOSE, CAPILLARY
Glucose-Capillary: 86 mg/dL (ref 70–99)
Glucose-Capillary: 134 mg/dL — ABNORMAL HIGH (ref 70–99)

## 2011-12-05 NOTE — Progress Notes (Signed)
Physical Therapy Session Note  Patient Details  Name: John Meza MRN: [id number] Date of Birth: [birth year]  Today's Date: 12/05/2011 Time: 7829-5621 Time Calculation (min): 28 min  S:  Pt initially not wanting to do therapy due to pain in left chest wall, wanting to wait until later in day.  Explained therapist inability to return later and pt then agreed to get up.  Skilled Therapeutic Interventions/Progress Updates:   Family ed with wife for donning clothing, binder, and TLSO via rolling.  Pt performed supine to sit using leg loops, raising HOB, and bed rails with close supervision.  Pt and wife performed sliding board transfer bed to w/c, with supervision from therapist.  Pt wanting to avoid any therapy that would aggravate the left chest wall, performed UE exercises with RUE that did not cause pain x 20 reps on pt's HEP.  Tx then stopped with pt instructed to rest the LUE and not do activities to aggravate for the next 2 days so that he would be ready for Monday.  Pt agreed.    Therapy Documentation Precautions:  Precautions Precautions: Back Precaution Booklet Issued: No Precaution Comments:  (0/5 B LE MMT) Required Braces or Orthoses: Yes Spinal Brace: Thoracolumbosacral orthotic;Applied in supine position Other Brace/Splint: abdominal binder Restrictions Weight Bearing Restrictions: No General: Amount of Missed PT Time (min): 32 Minutes Missed Time Reason: Pain (left chest pain limiting activity) Pain: Pain Assessment Pain Assessment:  (C/o pain in left upper chest, not rated, aggravated by mvmt)  See FIM for current functional status  Therapy/Group: Individual Therapy  Georges Mouse 12/05/2011, 11:25 AM

## 2011-12-05 NOTE — Progress Notes (Signed)
Patient ID: John Meza, male   DOB: Dec 15, 1960, 51 y.o.   MRN: [id number]   Subjective/Complaints:  Review of Systems  Unable to perform ROS: language  2/23  patient fell last evening when attempting to transfer to the bedside commode. Due to the low-grade fever and persistent left-sided chest wall pain a chest x-ray was obtained;  this revealed no pneumonia pneumothorax or obvious rib fracture. Patient speaks very little English but still seems to have some mild discomfort. Examination reveals the patient be in no acute distress. Examination chest wall revealed no ecchymoses;  there does  not appear to be any significant tenderness along the left chest wall or left upper quadrant. Abdomen is soft and nontender without distention. Bowel sounds are normal Capillary blood sugars remain nicely controlled.  Vital Signs: Blood pressure 99/57, pulse 80, temperature 98.8 F (37.1 C), temperature source Oral, resp. rate 18, weight 181 lb 3.5 oz (82.2 kg), SpO2 95.00%. Dg Chest Port 1 View  12/04/2011  *RADIOLOGY REPORT*  Clinical Data: 51 year old male with diabetes, hypertension, pneumonia.  PORTABLE CHEST - 1 VIEW  Comparison: 11/20/2011 and earlier.  Findings: Portable semi upright AP view 1753 hours.  Thoracolumbar spinal fusion hardware again noted.  Lower lung volumes, but improved bibasilar ventilation with no confluent pulmonary opacity. No pneumothorax, edema or effusion.  Stable cardiac size and mediastinal contours.  IMPRESSION: Lower lung volumes but interval improved bibasilar ventilation.  No acute pneumonia suspected.  Original Report Authenticated By: Harley Hallmark, M.D.   Results for orders placed during the hospital encounter of 11/20/11 (from the past 72 hour(s))  GLUCOSE, CAPILLARY     Status: Abnormal   Collection Time   12/02/11 12:00 PM      Component Value Range Comment   Glucose-Capillary 108 (*) 70 - 99 (mg/dL)   GLUCOSE, CAPILLARY     Status: Abnormal   Collection Time   12/02/11  4:58 PM      Component Value Range Comment   Glucose-Capillary 131 (*) 70 - 99 (mg/dL)    Comment 1 Notify RN     GLUCOSE, CAPILLARY     Status: Abnormal   Collection Time   12/02/11  9:07 PM      Component Value Range Comment   Glucose-Capillary 135 (*) 70 - 99 (mg/dL)   GLUCOSE, CAPILLARY     Status: Normal   Collection Time   12/03/11  7:13 AM      Component Value Range Comment   Glucose-Capillary 79  70 - 99 (mg/dL)   GLUCOSE, CAPILLARY     Status: Abnormal   Collection Time   12/03/11 12:27 PM      Component Value Range Comment   Glucose-Capillary 133 (*) 70 - 99 (mg/dL)    Comment 1 Notify RN     GLUCOSE, CAPILLARY     Status: Abnormal   Collection Time   12/03/11  4:38 PM      Component Value Range Comment   Glucose-Capillary 104 (*) 70 - 99 (mg/dL)    Comment 1 Notify RN     GLUCOSE, CAPILLARY     Status: Abnormal   Collection Time   12/03/11  9:08 PM      Component Value Range Comment   Glucose-Capillary 103 (*) 70 - 99 (mg/dL)    Comment 1 Notify RN     GLUCOSE, CAPILLARY     Status: Normal   Collection Time   12/04/11  7:08 AM      Component Value  Range Comment   Glucose-Capillary 82  70 - 99 (mg/dL)    Comment 1 Notify RN     GLUCOSE, CAPILLARY     Status: Normal   Collection Time   12/04/11 11:47 AM      Component Value Range Comment   Glucose-Capillary 98  70 - 99 (mg/dL)    Comment 1 Notify RN     GLUCOSE, CAPILLARY     Status: Abnormal   Collection Time   12/04/11  4:28 PM      Component Value Range Comment   Glucose-Capillary 179 (*) 70 - 99 (mg/dL)    Comment 1 Notify RN     GLUCOSE, CAPILLARY     Status: Abnormal   Collection Time   12/04/11  9:09 PM      Component Value Range Comment   Glucose-Capillary 152 (*) 70 - 99 (mg/dL)    Comment 1 Notify RN     GLUCOSE, CAPILLARY     Status: Normal   Collection Time   12/05/11  7:16 AM      Component Value Range Comment   Glucose-Capillary 86  70 - 99 (mg/dL)    Comment 1 Notify RN       Physical Exam  Nursing note and vitals reviewed.  Constitutional: He is oriented to person, place, and time. He appears well-developed and well-nourished.  HENT:  Head: Normocephalic.  Eyes: Pupils are equal, round, and reactive to light. EOMI Neck: Normal range of motion. Neck supple.  Cardiovascular: Normal rate.  Pulmonary/Chest:  Decreased breath sounds RLL. Dyspnea with deep inspiration.  Abdominal: Soft. Bowel sounds are normal. He exhibits no distension. There is no tenderness.  Musculoskeletal: He exhibits no edema.  Neurological: He is alert and oriented to person, place, and time. He displays abnormal reflex.   He has 0 out of 5 strength in both lower extremities. Areflexic-BLE. Follows commands  Upper extremity motor  intact.  There is a language barrier. No sensation below the level of injury. Cognitively intact. Skin: Skin is warm and dry.  Wound is clean and intact with steristrips and no drainage. i see no breakdown elsewhere Psychiatric: He has a normal mood and affect. His behavior is normal.  Ext:  No swelling or edema in BLE, no discoloration 2/22-exam Assessment/Plan: 1. 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 Physiatrist is providing close team supervision and 24 hour management of active medical problems listed below. Physiatrist and rehab team continue to assess barriers to discharge/monitor patient progress toward functional and medical goals. FIM: FIM - Bathing Bathing Steps Patient Completed: Chest;Right Arm;Left Arm;Abdomen;Front perineal area;Buttocks;Right upper leg;Left upper leg;Right lower leg (including foot);Left lower leg (including foot) Bathing: 5: Set-up assist to: Obtain items  FIM - Upper Body Dressing/Undressing Upper body dressing/undressing steps patient completed: Thread/unthread  right sleeve of pullover shirt/dresss;Thread/unthread left sleeve of pullover shirt/dress;Put head through opening of pull over shirt/dress;Pull shirt over trunk Upper body dressing/undressing: 5: Set-up assist to: Obtain clothing/put away FIM - Lower Body Dressing/Undressing Lower body dressing/undressing steps patient completed: Thread/unthread right pants leg;Thread/unthread left pants leg Lower body dressing/undressing: 2: Max-Patient completed 25-49% of tasks  FIM - Toileting Toileting: 0: Activity did not occur  FIM - Diplomatic Services operational officer Devices: Human resources officer Transfers: 0-Activity did not occur Toilet Transfers DO NOT USE: 4-To toilet/BSC: Min A (steadying Pt. >  75%)  FIM - Bed/Chair Transfer Bed/Chair Transfer Assistive Devices: Bed rails;Orthosis Bed/Chair Transfer: 4: Chair or W/C > Bed: Min A (steadying Pt. > 75%);3: Sit > Supine: Mod A (lifting assist/Pt. 50-74%/lift 2 legs)  FIM - Locomotion: Wheelchair Distance: 150 Locomotion: Wheelchair: 6: Travels 150 ft or more, turns around, maneuvers to table, bed or toilet, negotiates 3% grade: maneuvers on rugs and over door sills independently FIM - Locomotion: Ambulation Locomotion: Ambulation Assistive Devices: Other (comment) (currently paraplegic) Locomotion: Ambulation: 0: Activity did not occur  Comprehension Comprehension Mode: Auditory Comprehension: 6-Follows complex conversation/direction: With extra time/assistive device  Expression Expression Mode: Verbal Expression Assistive Devices: 6-Other (Comment) Expression: 5-Expresses complex 90% of the time/cues < 10% of the time  Social Interaction Social Interaction: 6-Interacts appropriately with others with medication or extra time (anti-anxiety, antidepressant).  Problem Solving Problem Solving Mode: Asleep Problem Solving: 6-Solves complex problems: With extra time  Memory Memory Mode: Asleep Memory: 6-More than  reasonable amt of time   Medical Problem List and Plan:  1. T.-11 fracture after fall with complete spinal cord injury. Begin lovenox 2.. DVT Prophylaxis/Anticoagulation: SCDs. . Monitor for any signs of deep vein thrombosis. Dopplers negative  3. Pain management. Lyrica 75 mg twice daily, oxycodone as needed.   -pt continues to denies significant pain 4. Neurogenic bowel:   -getting toward daily to qod/qd routine. Getting up to bsc 5. Diabetes mellitus. Hemoglobin A1c of 10.2. Metformin increased and numbers are better.  -continue lantus 6. Hyperlipidemia. Crestor 7. Neurogenic bladder: IC program to keep volumes between 300 and 500 cc   -may need to cath him more frequently based on yesterday's volumes.  -doubt he'll be able to spontaneously void. LOS (Days) 15 A FACE TO FACE EVALUATION WAS PERFORMED  Rogelia Boga 12/05/2011, 7:53 AM

## 2011-12-06 LAB — GLUCOSE, CAPILLARY
Glucose-Capillary: 93 mg/dL (ref 70–99)
Glucose-Capillary: 154 mg/dL — ABNORMAL HIGH (ref 70–99)
Glucose-Capillary: 113 mg/dL — ABNORMAL HIGH (ref 70–99)

## 2011-12-06 NOTE — Progress Notes (Signed)
Patient ID: Glenda Kunst, male   DOB: [birth year], 51 y.o.   MRN: [id number] Patient ID: Rodriquez Thorner, male   DOB: October 10, 1961, 51 y.o.   MRN: [id number]   Subjective/Complaints:  Review of Systems  Unable to perform ROS: language  2/24  seems comfortable this morning. Speaks  little English and communication difficult but does not appear to have any further left-sided chest pain. Blood sugars remain nicely controlled. Examination is unchanged. Chest remains clear. No chest wall tenderness or ecchymoses cardiovascular normal S1-S2 no murmurs abdomen benign. Extremities no edema BP Readings from Last 3 Encounters:  12/06/11 112/62  11/20/11 124/71  11/20/11 124/71    Vital Signs: Blood pressure 112/62, pulse 70, temperature 98.8 F (37.1 C), temperature source Oral, resp. rate 16, weight 82.2 kg (181 lb 3.5 oz), SpO2 94.00%. Dg Chest Port 1 View  12/04/2011  *RADIOLOGY REPORT*  Clinical Data: 51 year old male with diabetes, hypertension, pneumonia.  PORTABLE CHEST - 1 VIEW  Comparison: 11/20/2011 and earlier.  Findings: Portable semi upright AP view 1753 hours.  Thoracolumbar spinal fusion hardware again noted.  Lower lung volumes, but improved bibasilar ventilation with no confluent pulmonary opacity. No pneumothorax, edema or effusion.  Stable cardiac size and mediastinal contours.  IMPRESSION: Lower lung volumes but interval improved bibasilar ventilation.  No acute pneumonia suspected.  Original Report Authenticated By: Harley Hallmark, M.D.   Results for orders placed during the hospital encounter of 11/20/11 (from the past 72 hour(s))  GLUCOSE, CAPILLARY     Status: Abnormal   Collection Time   12/03/11 12:27 PM      Component Value Range Comment   Glucose-Capillary 133 (*) 70 - 99 (mg/dL)    Comment 1 Notify RN     GLUCOSE, CAPILLARY     Status: Abnormal   Collection Time   12/03/11  4:38 PM      Component Value Range Comment   Glucose-Capillary 104 (*) 70 - 99 (mg/dL)    Comment 1  Notify RN     GLUCOSE, CAPILLARY     Status: Abnormal   Collection Time   12/03/11  9:08 PM      Component Value Range Comment   Glucose-Capillary 103 (*) 70 - 99 (mg/dL)    Comment 1 Notify RN     GLUCOSE, CAPILLARY     Status: Normal   Collection Time   12/04/11  7:08 AM      Component Value Range Comment   Glucose-Capillary 82  70 - 99 (mg/dL)    Comment 1 Notify RN     GLUCOSE, CAPILLARY     Status: Normal   Collection Time   12/04/11 11:47 AM      Component Value Range Comment   Glucose-Capillary 98  70 - 99 (mg/dL)    Comment 1 Notify RN     GLUCOSE, CAPILLARY     Status: Abnormal   Collection Time   12/04/11  4:28 PM      Component Value Range Comment   Glucose-Capillary 179 (*) 70 - 99 (mg/dL)    Comment 1 Notify RN     GLUCOSE, CAPILLARY     Status: Abnormal   Collection Time   12/04/11  9:09 PM      Component Value Range Comment   Glucose-Capillary 152 (*) 70 - 99 (mg/dL)    Comment 1 Notify RN     GLUCOSE, CAPILLARY     Status: Normal   Collection Time   12/05/11  7:16 AM  Component Value Range Comment   Glucose-Capillary 86  70 - 99 (mg/dL)    Comment 1 Notify RN     GLUCOSE, CAPILLARY     Status: Abnormal   Collection Time   12/05/11 12:13 PM      Component Value Range Comment   Glucose-Capillary 128 (*) 70 - 99 (mg/dL)   GLUCOSE, CAPILLARY     Status: Abnormal   Collection Time   12/05/11  5:11 PM      Component Value Range Comment   Glucose-Capillary 107 (*) 70 - 99 (mg/dL)    Comment 1 Notify RN     GLUCOSE, CAPILLARY     Status: Abnormal   Collection Time   12/05/11  9:37 PM      Component Value Range Comment   Glucose-Capillary 134 (*) 70 - 99 (mg/dL)   GLUCOSE, CAPILLARY     Status: Normal   Collection Time   12/06/11  7:37 AM      Component Value Range Comment   Glucose-Capillary 93  70 - 99 (mg/dL)    Comment 1 Notify RN      Physical Exam  Nursing note and vitals reviewed.  Constitutional: He is oriented to person, place, and time. He  appears well-developed and well-nourished.  HENT:  Head: Normocephalic.  Eyes: Pupils are equal, round, and reactive to light. EOMI Neck: Normal range of motion. Neck supple.  Cardiovascular: Normal rate.  Pulmonary/Chest:  Decreased breath sounds RLL. Dyspnea with deep inspiration.  Abdominal: Soft. Bowel sounds are normal. He exhibits no distension. There is no tenderness.  Musculoskeletal: He exhibits no edema.  Neurological: He is alert and oriented to person, place, and time. He displays abnormal reflex.   He has 0 out of 5 strength in both lower extremities. Areflexic-BLE. Follows commands  Upper extremity motor  intact.  There is a language barrier. No sensation below the level of injury. Cognitively intact. Skin: Skin is warm and dry.  Wound is clean and intact with steristrips and no drainage. i see no breakdown elsewhere Psychiatric: He has a normal mood and affect. His behavior is normal.  Ext:  No swelling or edema in BLE, no discoloration 2/22-exam Assessment/Plan: 1. 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 Physiatrist is providing close team supervision and 24 hour management of active medical problems listed below. Physiatrist and rehab team continue to assess barriers to discharge/monitor patient progress toward functional and medical goals. FIM: FIM - Bathing Bathing Steps Patient Completed: Chest;Right Arm;Left Arm;Abdomen;Front perineal area;Buttocks;Right upper leg;Left upper leg;Right lower leg (including foot);Left lower leg (including foot) Bathing: 5: Set-up assist to: Obtain items  FIM - Upper Body Dressing/Undressing Upper body dressing/undressing steps patient completed: Thread/unthread right sleeve of pullover shirt/dresss;Thread/unthread left sleeve of pullover shirt/dress;Put head through opening  of pull over shirt/dress;Pull shirt over trunk Upper body dressing/undressing: 5: Set-up assist to: Obtain clothing/put away FIM - Lower Body Dressing/Undressing Lower body dressing/undressing steps patient completed: Thread/unthread right pants leg;Thread/unthread left pants leg Lower body dressing/undressing: 2: Max-Patient completed 25-49% of tasks  FIM - Toileting Toileting: 0: Activity did not occur  FIM - Diplomatic Services operational officer Devices: Human resources officer Transfers: 0-Activity did not occur Toilet Transfers DO NOT USE: 4-To toilet/BSC: Min A (steadying Pt. > 75%)  FIM - Bed/Chair Transfer Bed/Chair Transfer Assistive Devices: Sliding board;Orthosis (leg loops) Bed/Chair  Transfer: 5: Set-up assist to: Apply orthosis/W/C set-up;5: Supine > Sit: Supervision (verbal cues/safety issues);4: Bed > Chair or W/C: Min A (steadying Pt. > 75%)  FIM - Locomotion: Wheelchair Distance: 150 Locomotion: Wheelchair: 6: Travels 150 ft or more, turns around, maneuvers to table, bed or toilet, negotiates 3% grade: maneuvers on rugs and over door sills independently FIM - Locomotion: Ambulation Locomotion: Ambulation Assistive Devices: Other (comment) (currently paraplegic) Locomotion: Ambulation: 0: Activity did not occur  Comprehension Comprehension Mode: Auditory Comprehension: 6-Follows complex conversation/direction: With extra time/assistive device  Expression Expression Mode: Verbal Expression Assistive Devices: 6-Other (Comment) Expression: 5-Expresses complex 90% of the time/cues < 10% of the time  Social Interaction Social Interaction: 6-Interacts appropriately with others with medication or extra time (anti-anxiety, antidepressant).  Problem Solving Problem Solving Mode: Asleep Problem Solving: 6-Solves complex problems: With extra time  Memory Memory Mode: Asleep Memory: 6-More than reasonable amt of time   Medical Problem List and  Plan:  1. T.-11 fracture after fall with complete spinal cord injury. Begin lovenox 2.. DVT Prophylaxis/Anticoagulation: SCDs. . Monitor for any signs of deep vein thrombosis. Dopplers negative  3. Pain management. Lyrica 75 mg twice daily, oxycodone as needed.   -pt continues to denies significant pain 4. Neurogenic bowel:   -getting toward daily to qod/qd routine. Getting up to bsc 5. Diabetes mellitus. Hemoglobin A1c of 10.2. Metformin increased and numbers are better.  -continue lantus 6. Hyperlipidemia. Crestor 7. Neurogenic bladder: IC program to keep volumes between 300 and 500 cc   -may need to cath him more frequently based on yesterday's volumes.  -doubt he'll be able to spontaneously void. LOS (Days) 16 A FACE TO FACE EVALUATION WAS PERFORMED  Rogelia Boga 12/06/2011, 8:08 AM

## 2011-12-06 NOTE — Progress Notes (Signed)
Patient I/O cath for 1025cc urine, small amt of urine noted in brief, bladder scanned at 0130 for >999cc. Urine was clear, yellow and no odor present.

## 2011-12-06 NOTE — Progress Notes (Signed)
Occupational Therapy Note  Patient Details  Name: John Meza MRN: [id number] Date of Birth: Feb 15, 1961 Today's Date: 12/06/2011  Time in/out: 1191-4782 Total minutes: 45  Pain: 5/10; RN aware (pain meds taken at beginning of tx)   Skilled Intervention: ADL-retraining, supine in bed, using bed rails and head/foot elevation features of hospital bed as needed, with emphasis on caregiver ed, safety awareness, pain management and energy conservation.  OT introduced patient to shampoo shower cap and reviewed use of long sponge.  Patient's spouse provides assist with washing pt's back and providing bathing supplies when patient requests them.   Patient able to use long sponge and manage LE with min assist to maintain position (knee flexed) but required assist with applying lotion to his feet.   Good thoroughness with bathing however patient's methodical pace limited session to assisted bathing only this date.  Therapy: Individual Session  Georgeanne Nim 12/06/2011, 4:29 PM

## 2011-12-07 LAB — GLUCOSE, CAPILLARY: Glucose-Capillary: 100 mg/dL — ABNORMAL HIGH (ref 70–99)

## 2011-12-07 NOTE — Progress Notes (Signed)
Physical Therapy Session Note  Patient Details  Name: Leomar Westberg MRN: [id number] Date of Birth: 11-29-1960  Today's Date: 12/07/2011 Time: 1430-1530 Time Calculation (min): 60 min  Short Term Goals = LTGs  Skilled Therapeutic Interventions/Progress Updates: Focused on community w/c mobility around the hospital for various points of access to manage elevators, doorways, bathrooms, and the cafeteria. Discussed various energy conservation and safety topics. Pt's wife present as well and both reported finding this useful and gave them things to think about in the home. (ex couch)     Therapy Documentation Precautions:  Precautions Precautions: Back Precaution Booklet Issued: No Precaution Comments:  (0/5 B LE MMT) Required Braces or Orthoses: Yes Spinal Brace: Thoracolumbosacral orthotic;Applied in supine position Other Brace/Splint: abdominal binder Restrictions Weight Bearing Restrictions: No Pain:  c/o back pain - premedicated  See FIM for current functional status  Therapy/Group: Individual Therapy  Karolee Stamps [facility] 12/07/2011, 4:12 PM

## 2011-12-07 NOTE — Progress Notes (Signed)
Note reviewed and accurately reflects treatment session.   

## 2011-12-07 NOTE — Progress Notes (Signed)
Occupational Therapy Session Note  Patient Details  Name: John Meza MRN: [id number] Date of Birth: 01/08/1961  Today's Date: 12/07/2011 Time: 1000-1100 Time Calculation (min): 60 min   Skilled Therapeutic Interventions/Progress Updates:  ADL retraining in room. Wife provided setup and demonstrated ability to provide assistance independently. Bathing supine in bed with HOB elevated. See FIM. Pt able to bathe upper legs and roll in bed using UE's after wife positioned LE's accordingly. RN entered room to change dressing and recommended pt have BM. RN provided digital stimulation. Transferred pt SteadyA to/from drop arm commode using sliding board. After BM wife finished LB dressing in bed and provided SteadyA for sliding board transfer to [facility]. Focus on activity tolerance, family education and transfers.   Pain: "back hurts a little"  See FIM for current functional status  Therapy/Group: Individual Therapy  Loleta Chance, OTAS 12/07/2011, 12:13 PM

## 2011-12-07 NOTE — Progress Notes (Signed)
Occupational Therapy Session Note  Patient Details  Name: John Meza MRN: [id number] Date of Birth: [birth year]  Today's Date: 12/07/2011 Time: 1300-1400 Time Calculation (min): 60 min  Skilled Therapeutic Interventions/Progress Updates:  Therapeutic activity focusing on education pertaining to SCIs. Gave booklet with information in Spanish; therapist organized booklet so patient can read information pertinent to him and his injury. Therapeutic exercise focusing on slide board transfers <-> w/c to therapy mat with steady assist each transfer from therapist. Engaged in dynamic sitting balance exercises and UE/UB/core exercises while seated on mat. Encouraged patient to engage in exercises once discharged -> home.    Precautions:  Precautions Precautions: Back Precaution Booklet Issued: No Precaution Comments:  (0/5 B LE MMT) Required Braces or Orthoses: Yes Spinal Brace: Thoracolumbosacral orthotic;Applied in supine position Other Brace/Splint: abdominal binder Restrictions Weight Bearing Restrictions: No  Pan: No complaints of pain  See FIM for current functional status  Therapy/Group: Individual Therapy  Jake Goodson 12/07/2011, 2:16 PM

## 2011-12-07 NOTE — Progress Notes (Signed)
Suppository given at 0815. Up to bedside commode with small result, after dig stim at 0845. Nurse called to room for dressing change, and allevyn drsg to opening in gluteal fold at 0940. Noted stool in rectum. Rectal vault full of stool. Pt transferred to bsc. Extra large soft stool, on commode. Rectal vault checked to ensure emptying.  Wife and pt continuing to perform I&O cath. Wife to perform bowel program in the a.m. Oxy IR 10-20mg  q 4hrs.

## 2011-12-07 NOTE — Progress Notes (Signed)
Patient Details  Name: John Meza MRN: [id number] Date of Birth: 1961/09/27  Today's Date: 12/07/2011 Time: 1430-1530  Skilled Therapeutic Interventions/Progress Updates: Out and about on hospital grounds w/c level propelling w/c on tile and carpeted surfaces using BUE's, accessing public restrooms, problem solving how to open and close doors, enter/exit elevator, negotiate obstacles to prepare for community pursuits post d/c and discussed adaptations for furniture at home to aid in successful transfers. Pt states plan to purchase a couch that higher from the floor once discharged.   Discussed safety concerns with pt & wife.  No c/o pain, but wife states pt has pain and was medicated prior to session.  Therapy/Group: co-treat with PT  Activity Level: Moderate:  Level of assist: Supervision  Marri Mcneff 12/07/2011, 3:52 PM

## 2011-12-07 NOTE — Progress Notes (Signed)
Physical Therapy Session Note  Patient Details  Name: John Meza MRN: [id number] Date of Birth: [birth year]  Today's Date: 12/07/2011 Time: 1100-1156 Time Calculation (min): 56 min  Short Term Goals = LTGs  Skilled Therapeutic Interventions/Progress Updates: Bed mobility and transfer training in ADL apartment to/from soft bed with steady A, assist needed for set up of slide board. Pt able to direct transfer independently. Seated w/c activty with back support due to increased pain for ball toss without armrest on w/c with cognitive task to name words as catching/throwing the ball.  w/c mobility training up/down ramp with mod A initially, progressed to steady A for home entry. W/c mobility negotiating cones and propelling on soft mat to simulate outdoor uneven environment.   Therapy Documentation Precautions:  Precautions Precautions: Back Precaution Booklet Issued: No Precaution Comments:  (0/5 B LE MMT) Required Braces or Orthoses: Yes Spinal Brace: Thoracolumbosacral orthotic;Applied in supine position Other Brace/Splint: abdominal binder Restrictions Weight Bearing Restrictions: No Pain:  c/o 9/10 pain, premedicated right before session per pt/family report   See FIM for current functional status  Therapy/Group: Individual Therapy  Karolee Stamps [facility] 12/07/2011, 12:10 PM

## 2011-12-07 NOTE — Progress Notes (Signed)
Subjective/Complaints:  Review of Systems  Unable to perform ROS: language  2/25- no new problems  BP Readings from Last 3 Encounters:  12/07/11 100/55  11/20/11 124/71  11/20/11 124/71    Vital Signs: Blood pressure 100/55, pulse 61, temperature 97.9 F (36.6 C), temperature source Oral, resp. rate 18, weight 82.2 kg (181 lb 3.5 oz), SpO2 97.00%. No results found. Results for orders placed during the hospital encounter of 11/20/11 (from the past 72 hour(s))  GLUCOSE, CAPILLARY     Status: Normal   Collection Time   12/04/11 11:47 AM      Component Value Range Comment   Glucose-Capillary 98  70 - 99 (mg/dL)    Comment 1 Notify RN     GLUCOSE, CAPILLARY     Status: Abnormal   Collection Time   12/04/11  4:28 PM      Component Value Range Comment   Glucose-Capillary 179 (*) 70 - 99 (mg/dL)    Comment 1 Notify RN     GLUCOSE, CAPILLARY     Status: Abnormal   Collection Time   12/04/11  9:09 PM      Component Value Range Comment   Glucose-Capillary 152 (*) 70 - 99 (mg/dL)    Comment 1 Notify RN     GLUCOSE, CAPILLARY     Status: Normal   Collection Time   12/05/11  7:16 AM      Component Value Range Comment   Glucose-Capillary 86  70 - 99 (mg/dL)    Comment 1 Notify RN     GLUCOSE, CAPILLARY     Status: Abnormal   Collection Time   12/05/11 12:13 PM      Component Value Range Comment   Glucose-Capillary 128 (*) 70 - 99 (mg/dL)   GLUCOSE, CAPILLARY     Status: Abnormal   Collection Time   12/05/11  5:11 PM      Component Value Range Comment   Glucose-Capillary 107 (*) 70 - 99 (mg/dL)    Comment 1 Notify RN     GLUCOSE, CAPILLARY     Status: Abnormal   Collection Time   12/05/11  9:37 PM      Component Value Range Comment   Glucose-Capillary 134 (*) 70 - 99 (mg/dL)   GLUCOSE, CAPILLARY     Status: Normal   Collection Time   12/06/11  7:37 AM      Component Value Range Comment   Glucose-Capillary 93  70 - 99 (mg/dL)    Comment 1 Notify RN     GLUCOSE, CAPILLARY      Status: Abnormal   Collection Time   12/06/11 12:11 PM      Component Value Range Comment   Glucose-Capillary 154 (*) 70 - 99 (mg/dL)    Comment 1 Notify RN     GLUCOSE, CAPILLARY     Status: Abnormal   Collection Time   12/06/11  4:54 PM      Component Value Range Comment   Glucose-Capillary 113 (*) 70 - 99 (mg/dL)    Comment 1 Notify RN     GLUCOSE, CAPILLARY     Status: Abnormal   Collection Time   12/06/11  8:48 PM      Component Value Range Comment   Glucose-Capillary 213 (*) 70 - 99 (mg/dL)    Comment 1 Notify RN     GLUCOSE, CAPILLARY     Status: Normal   Collection Time   12/07/11  7:17 AM  Component Value Range Comment   Glucose-Capillary 71  70 - 99 (mg/dL)    Comment 1 Notify RN      Physical Exam  Nursing note and vitals reviewed.  Constitutional: He is oriented to person, place, and time. He appears well-developed and well-nourished.  HENT:  Head: Normocephalic.  Eyes: Pupils are equal, round, and reactive to light. EOMI Neck: Normal range of motion. Neck supple.  Cardiovascular: Normal rate.  Pulmonary/Chest:  Decreased breath sounds RLL. Dyspnea with deep inspiration.  Abdominal: Soft. Bowel sounds are normal. He exhibits no distension. There is no tenderness.  Musculoskeletal: He exhibits no edema.  Neurological: He is alert and oriented to person, place, and time. He displays abnormal reflex.   He has 0 out of 5 strength in both lower extremities. Areflexic-BLE. Follows commands  Upper extremity motor  intact.  There is a language barrier. No sensation below the level of injury. Cognitively intact. Skin: Skin is warm and dry.  Wound is clean and intact with steristrips and no drainage. i see no breakdown elsewhere Psychiatric: He has a normal mood and affect. His behavior is normal.  Ext:  No swelling or edema in BLE, no discoloration 2/25-exam  Assessment/Plan: 1. 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 Physiatrist is providing close team supervision and 24 hour management of active medical problems listed below. Physiatrist and rehab team continue to assess barriers to discharge/monitor patient progress toward functional and medical goals. FIM: FIM - Bathing Bathing Steps Patient Completed: Chest;Right Arm;Left Arm;Abdomen;Front perineal area;Right upper leg;Left upper leg;Right lower leg (including foot);Left lower leg (including foot) Bathing: 4: Min-Patient completes 8-9 38f 10 parts or 75+ percent  FIM - Upper Body Dressing/Undressing Upper body dressing/undressing steps patient completed: Thread/unthread right sleeve of pullover shirt/dresss;Thread/unthread left sleeve of pullover shirt/dress;Put head through opening of pull over shirt/dress;Pull shirt over trunk Upper body dressing/undressing: 0: Activity did not occur FIM - Lower Body Dressing/Undressing Lower body dressing/undressing steps patient completed: Thread/unthread right pants leg;Thread/unthread left pants leg Lower body dressing/undressing: 0: Activity did not occur  FIM - Toileting Toileting: 1: Two helpers  FIM - Diplomatic Services operational officer Devices: Human resources officer Transfers: 0-Activity did not Nutritional therapist Transfers DO NOT USE: 4-To toilet/BSC: Min A (steadying Pt. > 75%)  FIM - Bed/Chair Transfer Bed/Chair Transfer Assistive Devices: Sliding board;Orthosis (leg loops) Bed/Chair Transfer: 5: Set-up assist to: Apply orthosis/W/C set-up;5: Supine > Sit: Supervision (verbal cues/safety issues);4: Bed > Chair or W/C: Min A (steadying Pt. > 75%)  FIM - Locomotion: Wheelchair Distance: 150 Locomotion: Wheelchair: 6: Travels 150 ft or more, turns around, maneuvers to table, bed or toilet, negotiates 3% grade: maneuvers on rugs and over door sills independently FIM -  Locomotion: Ambulation Locomotion: Ambulation Assistive Devices: Other (comment) (currently paraplegic) Locomotion: Ambulation: 0: Activity did not occur  Comprehension Comprehension Mode: Auditory Comprehension: 7-Follows complex conversation/direction: With no assist  Expression Expression Mode: Verbal Expression Assistive Devices: 6-Other (Comment) Expression: 5-Expresses complex 90% of the time/cues < 10% of the time  Social Interaction Social Interaction: 6-Interacts appropriately with others with medication or extra time (anti-anxiety, antidepressant).  Problem Solving Problem Solving Mode: Asleep Problem Solving: 6-Solves complex problems: With extra time  Memory Memory Mode: Asleep Memory: 6-More than reasonable amt of time   Medical Problem List and Plan:  1. T.-11 fracture after fall  with complete spinal cord injury. Begin lovenox 2.. DVT Prophylaxis/Anticoagulation: SCDs. . Monitor for any signs of deep vein thrombosis. Dopplers negative  3. Pain management. Lyrica 75 mg twice daily, oxycodone as needed.   -pt continues to denies significant pain 4. Neurogenic bowel:   -getting toward daily to qod/qd routine. Getting up to bsc 5. Diabetes mellitus. Hemoglobin A1c of 10.2. Metformin increased and numbers are better.  -continue lantus 6. Hyperlipidemia. Crestor 7. Neurogenic bladder: IC program to keep volumes between 300 and 500 cc   -need to cath more frequently.  Volumes are too high.  -doubt he'll be able to spontaneously void.  -i want patient participating in caths. LOS (Days) 17 A FACE TO FACE EVALUATION WAS PERFORMED  Nazyia Gaugh T 12/07/2011, 7:51 AM

## 2011-12-08 LAB — GLUCOSE, CAPILLARY
Glucose-Capillary: 74 mg/dL (ref 70–99)
Glucose-Capillary: 102 mg/dL — ABNORMAL HIGH (ref 70–99)

## 2011-12-08 NOTE — Progress Notes (Signed)
Occupational Therapy Session Notes  Patient Details  Name: John Meza MRN: [id number] Date of Birth: 1961/07/22  Today's Date: 12/08/2011  Short Term Goals = Long Term Goals  Skilled Therapeutic Interventions/Progress Updates:  Session #1 1000-1100 - 60 Minutes Individual Therapy No complaints of pain ADL retraining at bed level. Education -> wife for her to complete routine to increase independence with overall routine between patient and wife. UB/LB bathing with set-up assist in supine position with HOB up/down prn. UB dressing in supine secondary to TLSO orders, LB dressing in supine position with HOB up/down prn. Donned TLSO in supine and encouraged patient to donn bilateral shoes, patient unable to donn shoes with TLSO donned; recommend patient donn shoes before TLSO. Left patient and wife in room for transfer from edge of bed -> w/c without therapist present.    Session #2 6213-0865 - 55 Minutes Individual Therapy No complaints of pain Engaged in drop arm BSC transfer w/c <-> drop arm using slide board with min assist from therapist; wife also present for safety. Recommend when patient performs BSC transfers at home, two people be present for safety. Patient then propelled self -> ADL apartment for tub/shower transfer onto tub transfer bench using slide board with mod assist, patient needing assist to get bilateral LEs in/out of tub. Also engaged in therapeutic exercise using para gym for UE/UB strengthening exercises.   Precautions:  Precautions Precautions: Back Precaution Booklet Issued: No Precaution Comments:  (0/5 B LE MMT) Required Braces or Orthoses: Yes Spinal Brace: Thoracolumbosacral orthotic;Applied in supine position Other Brace/Splint: abdominal binder Restrictions Weight Bearing Restrictions: No  See FIM for current functional status  John Meza 12/08/2011, 10:43 AM

## 2011-12-08 NOTE — Progress Notes (Signed)
Patient ID: John Meza, male   DOB: May 12, 1961, 51 y.o.   MRN: [id number]  Subjective/Complaints:  Review of Systems  Unable to perform ROS: language  2/26- no new problems again. BP Readings from Last 3 Encounters:  12/08/11 109/67  11/20/11 124/71  11/20/11 124/71    Vital Signs: Blood pressure 109/67, pulse 69, temperature 98.3 F (36.8 C), temperature source Oral, resp. rate 20, weight 82.2 kg (181 lb 3.5 oz), SpO2 94.00%. No results found. Results for orders placed during the hospital encounter of 11/20/11 (from the past 72 hour(s))  GLUCOSE, CAPILLARY     Status: Normal   Collection Time   12/05/11  7:16 AM      Component Value Range Comment   Glucose-Capillary 86  70 - 99 (mg/dL)    Comment 1 Notify RN     GLUCOSE, CAPILLARY     Status: Abnormal   Collection Time   12/05/11 12:13 PM      Component Value Range Comment   Glucose-Capillary 128 (*) 70 - 99 (mg/dL)   GLUCOSE, CAPILLARY     Status: Abnormal   Collection Time   12/05/11  5:11 PM      Component Value Range Comment   Glucose-Capillary 107 (*) 70 - 99 (mg/dL)    Comment 1 Notify RN     GLUCOSE, CAPILLARY     Status: Abnormal   Collection Time   12/05/11  9:37 PM      Component Value Range Comment   Glucose-Capillary 134 (*) 70 - 99 (mg/dL)   GLUCOSE, CAPILLARY     Status: Normal   Collection Time   12/06/11  7:37 AM      Component Value Range Comment   Glucose-Capillary 93  70 - 99 (mg/dL)    Comment 1 Notify RN     GLUCOSE, CAPILLARY     Status: Abnormal   Collection Time   12/06/11 12:11 PM      Component Value Range Comment   Glucose-Capillary 154 (*) 70 - 99 (mg/dL)    Comment 1 Notify RN     GLUCOSE, CAPILLARY     Status: Abnormal   Collection Time   12/06/11  4:54 PM      Component Value Range Comment   Glucose-Capillary 113 (*) 70 - 99 (mg/dL)    Comment 1 Notify RN     GLUCOSE, CAPILLARY     Status: Abnormal   Collection Time   12/06/11  8:48 PM      Component Value Range Comment   Glucose-Capillary 213 (*) 70 - 99 (mg/dL)    Comment 1 Notify RN     GLUCOSE, CAPILLARY     Status: Normal   Collection Time   12/07/11  7:17 AM      Component Value Range Comment   Glucose-Capillary 71  70 - 99 (mg/dL)    Comment 1 Notify RN     GLUCOSE, CAPILLARY     Status: Abnormal   Collection Time   12/07/11 11:50 AM      Component Value Range Comment   Glucose-Capillary 105 (*) 70 - 99 (mg/dL)    Comment 1 Notify RN     GLUCOSE, CAPILLARY     Status: Abnormal   Collection Time   12/07/11  4:20 PM      Component Value Range Comment   Glucose-Capillary 100 (*) 70 - 99 (mg/dL)    Comment 1 Notify RN     GLUCOSE, CAPILLARY     Status:  Abnormal   Collection Time   12/07/11  9:11 PM      Component Value Range Comment   Glucose-Capillary 162 (*) 70 - 99 (mg/dL)    Comment 1 Notify RN      Physical Exam  Nursing note and vitals reviewed.  Constitutional: He is oriented to person, place, and time. He appears well-developed and well-nourished.  HENT:  Head: Normocephalic.  Eyes: Pupils are equal, round, and reactive to light. EOMI Neck: Normal range of motion. Neck supple.  Cardiovascular: Normal rate.  Pulmonary/Chest:  Decreased breath sounds RLL. Dyspnea with deep inspiration.  Abdominal: Soft. Bowel sounds are normal. He exhibits no distension. There is no tenderness.  Musculoskeletal: He exhibits no edema.  Neurological: He is alert and oriented to person, place, and time. He displays abnormal reflex.   He has 0 out of 5 strength in both lower extremities. Areflexic-BLE. Follows commands  Upper extremity motor  intact.  There is a language barrier. No sensation below the level of injury. Cognitively intact. Skin: Skin is warm and dry.  Wound is clean and intact with steristrips and no drainage. i see no breakdown elsewhere Psychiatric: He has a normal mood and affect. His behavior is normal.  Ext:  No swelling or edema in BLE, no discoloration 2/26-exam  unchanged  Assessment/Plan: 1. 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 Physiatrist is providing close team supervision and 24 hour management of active medical problems listed below. Physiatrist and rehab team continue to assess barriers to discharge/monitor patient progress toward functional and medical goals. FIM: FIM - Bathing Bathing Steps Patient Completed: Chest;Right Arm;Left Arm;Abdomen;Front perineal area;Right upper leg;Right lower leg (including foot);Left upper leg Bathing: 4: Min-Patient completes 8-9 41f 10 parts or 75+ percent  FIM - Upper Body Dressing/Undressing Upper body dressing/undressing steps patient completed: Thread/unthread right sleeve of pullover shirt/dresss;Thread/unthread left sleeve of pullover shirt/dress;Put head through opening of pull over shirt/dress;Pull shirt over trunk Upper body dressing/undressing: 1: Total-Patient completed less than 25% of tasks FIM - Lower Body Dressing/Undressing Lower body dressing/undressing steps patient completed: Thread/unthread right pants leg;Thread/unthread left pants leg Lower body dressing/undressing: 1: Total-Patient completed less than 25% of tasks  FIM - Toileting Toileting: 1: Total-Patient completed zero steps, helper did all 3  FIM - Diplomatic Services operational officer Devices: Psychiatrist Transfers: 4-To toilet/BSC: Min A (steadying Pt. > 75%) Toilet Transfers DO NOT USE: 4-To toilet/BSC: Min A (steadying Pt. > 75%)  FIM - Banker Devices: Sliding board Bed/Chair Transfer: 4: Sit > Supine: Min A (steadying pt. > 75%/lift 1 leg)  FIM - Locomotion: Wheelchair Distance: 150 Locomotion: Wheelchair: 6: Travels 150 ft or more, turns around, maneuvers to table, bed or toilet, negotiates 3%  grade: maneuvers on rugs and over door sills independently FIM - Locomotion: Ambulation Locomotion: Ambulation Assistive Devices: Other (comment) (currently paraplegic) Locomotion: Ambulation: 0: Activity did not occur  Comprehension Comprehension Mode: Auditory Comprehension: 6-Follows complex conversation/direction: With extra time/assistive device  Expression Expression Mode: Verbal Expression Assistive Devices: 6-Other (Comment) Expression: 5-Expresses complex 90% of the time/cues < 10% of the time  Social Interaction Social Interaction: 6-Interacts appropriately with others with medication or extra time (anti-anxiety, antidepressant).  Problem Solving Problem Solving Mode: Asleep Problem Solving: 6-Solves complex problems: With extra time  Memory Memory Mode: Asleep Memory: 6-More than reasonable amt of  time   Medical Problem List and Plan:  1. T.-11 fracture after fall with complete spinal cord injury. Begin lovenox 2.. DVT Prophylaxis/Anticoagulation: SCDs. . Monitor for any signs of deep vein thrombosis. Dopplers negative  3. Pain management. Lyrica 75 mg twice daily, oxycodone as needed.   -pt continues to denies significant pain 4. Neurogenic bowel:   -getting toward daily to qod/qd routine. Getting up to bsc 5. Diabetes mellitus. Hemoglobin A1c of 10.2. Metformin increased and numbers are better.  -continue lantus 6. Hyperlipidemia. Crestor 7. Neurogenic bladder: IC program to keep volumes between 300 and 500 cc   -volumes better yesterday  -doubt he'll be able to spontaneously void.  -i want patient participating in caths. LOS (Days) 18 A FACE TO FACE EVALUATION WAS PERFORMED  Rosamary Boudreau T 12/08/2011, 7:01 AM

## 2011-12-08 NOTE — Progress Notes (Signed)
At 2300, pts wife performed I&O cath resulting in 600cc of clear yellow urine. Pt tolerated well. Will perform next I&O cath at 0500.

## 2011-12-08 NOTE — Progress Notes (Signed)
This RN in and out catherized the patient per his request for 375 ml clear yellow urine.  Patient tolerated well.

## 2011-12-08 NOTE — Significant Event (Signed)
When pt was attempting to I&O cath at 1800, significant amount of blood noted in urine. Delle Reining, PA notified. Order received to bladder scan pt, and irrigate bladder with of sterile water. Pt bladder scanned for 0ml. Foley cath inserted with clear, yellow urine return.Elita Quick Love notified. Order received to disregard irrigation, and adjust cath schedule per md's previous order.  Will adjust cath schedule to q5- 6hrs. [name]

## 2011-12-08 NOTE — Progress Notes (Signed)
Patient's wife in and out catherized patient for 350 ml clear amber urine.  Patient tolerated well.

## 2011-12-08 NOTE — Progress Notes (Signed)
Education provided via Spanish interpreter for bowel program, and dig stim. Suppository administered at 0815 by pt's wife, pt transferred to bedside commode. Small, form stool after wife performed dig stim at 08:45. Wife also performed I&O cath for , and changed lumbar incision drsg. All questions answered regarding bowel program. Wife to perform bowel program daily with nursing staff assist until discharge

## 2011-12-08 NOTE — Progress Notes (Signed)
Physical Therapy Session Note  Patient Details  Name: John Meza MRN: [id number] Date of Birth: 1961-05-05  Today's Date: 12/08/2011 Time: 1478-2956 Time Calculation (min): 40 min  Short Term Goals = LTGs  Skilled Therapeutic Interventions/Progress Updates:  dynamic sitting balance activity seated edge of mat with feet on the floor for proprioceptive input to hit ball with baseball bat, and toss and catch with both hands back and forth to his wife. Intermittent steady A needed. Upper extremity strengthening and coordination with weighted ball to toss at rebounder (yellow initially and progressed to blue) in w/c (arms rests taken off to decrease lateral stability). Transfers with slide board close supervision/steady A to/from mat uneven surface, cueing for lower extremity management. W/c push ups x 10 reps for pressure relief and upper extremity strengthening.      Therapy Documentation Precautions:  Precautions Precautions: Back Precaution Booklet Issued: No Precaution Comments:  (0/5 B LE MMT) Required Braces or Orthoses: Yes Spinal Brace: Thoracolumbosacral orthotic;Applied in supine position Other Brace/Splint: abdominal binder Restrictions Weight Bearing Restrictions: No Pain: Pain Assessment Pain Score: 0-No pain  See FIM for current functional status  Therapy/Group: Individual Therapy  Karolee Stamps [facility] 12/08/2011, 1:08 PM

## 2011-12-08 NOTE — Progress Notes (Signed)
Physical Therapy Session Note  Patient Details  Name: John Meza MRN: [id number] Date of Birth: 07/10/1961  Today's Date: 12/08/2011 Time: 1445-1600 Time Calculation (min): 75 min  Short Term Goals = LTGs  Skilled Therapeutic Interventions/Progress Updates: Family education with pt's wife and daughter with simulated car transfer set up best way possible to simulate real car and completed x 2 reps with min A with 2 people for safety. Daughter, pt and wife feel comfortable with this transfer and do not feel that they need to do the real car transfer again. Therapist agreed that simulated transfer today was great. Return demonstrated w/c breakdown completely. Pt completed close supervision to and from mat in gym. Dynamic sitting balance activity while seated on [employer] (compliant surface) for functional task with UEs to complete PVC Pipe puzzles. Required 1 UE support to maintain balance, good participation with task.      Therapy Documentation Precautions:  Precautions Precautions: Back Precaution Booklet Issued: No Precaution Comments:  (0/5 B LE MMT) Required Braces or Orthoses: Yes Spinal Brace: Thoracolumbosacral orthotic;Applied in supine position Other Brace/Splint: abdominal binder Restrictions Weight Bearing Restrictions: No Pain:  No complaint of pain, had ice on shoulder when therapist entered room. Stated it helped.      See FIM for current functional status  Therapy/Group: Individual Therapy  Karolee Stamps [facility] 12/08/2011, 4:04 PM

## 2011-12-09 DIAGNOSIS — W11XXXA Fall on and from ladder, initial encounter: Secondary | ICD-10-CM

## 2011-12-09 DIAGNOSIS — N319 Neuromuscular dysfunction of bladder, unspecified: Secondary | ICD-10-CM

## 2011-12-09 DIAGNOSIS — Z5189 Encounter for other specified aftercare: Secondary | ICD-10-CM

## 2011-12-09 DIAGNOSIS — K592 Neurogenic bowel, not elsewhere classified: Secondary | ICD-10-CM

## 2011-12-09 DIAGNOSIS — IMO0002 Reserved for concepts with insufficient information to code with codable children: Secondary | ICD-10-CM

## 2011-12-09 LAB — GLUCOSE, CAPILLARY
Glucose-Capillary: 76 mg/dL (ref 70–99)
Glucose-Capillary: 91 mg/dL (ref 70–99)
Glucose-Capillary: 122 mg/dL — ABNORMAL HIGH (ref 70–99)

## 2011-12-09 MED ORDER — MELOXICAM 7.5 MG PO TABS
7.5000 mg | ORAL_TABLET | Freq: Every day | ORAL | Status: AC
  Administered 2011-12-09 – 2011-12-11 (×3): 7.5 mg via ORAL
  Filled 2011-12-09 (×3): qty 1

## 2011-12-09 NOTE — Progress Notes (Signed)
Physical Therapy Session Note  Patient Details  Name: Deunte Bledsoe MRN: [id number] Date of Birth: Mar 17, 1961  Today's Date: 12/09/2011 Time: 1330-1530 Time Calculation (min): 120 min   Pt participated in outing to [employer] for community mobility training with w/c, safety in the community, and problem solving through situations. Pt able to take items on/off shelves and problem solve through various techniques. pt's daughter and wife present as well. see outing goal sheet for further details.     Therapy Documentation Precautions:  Precautions Precautions: Back Precaution Booklet Issued: No Precaution Comments:  (0/5 B LE MMT) Required Braces or Orthoses: Yes Spinal Brace: Thoracolumbosacral orthotic;Applied in supine position Other Brace/Splint: abdominal binder Restrictions Weight Bearing Restrictions: No Pain:  Denies pain  See FIM for current functional status  Therapy/Group: Co-Treatment and Group Therapy (outing with Recreational therapy)  Karolee Stamps [facility] 12/09/2011, 4:12 PM

## 2011-12-09 NOTE — Progress Notes (Signed)
Subjective/Complaints:  Review of Systems  Unable to perform ROS: language  2/27- caths per RN reports going fairly well. Reports left shoulder pain BP Readings from Last 3 Encounters:  12/09/11 96/57  11/20/11 124/71  11/20/11 124/71    Vital Signs: Blood pressure 96/57, pulse 75, temperature 99.3 F (37.4 C), temperature source Oral, resp. rate 20, weight 82.2 kg (181 lb 3.5 oz), SpO2 94.00%. No results found. Results for orders placed during the hospital encounter of 11/20/11 (from the past 72 hour(s))  GLUCOSE, CAPILLARY     Status: Normal   Collection Time   12/06/11  7:37 AM      Component Value Range Comment   Glucose-Capillary 93  70 - 99 (mg/dL)    Comment 1 Notify RN     GLUCOSE, CAPILLARY     Status: Abnormal   Collection Time   12/06/11 12:11 PM      Component Value Range Comment   Glucose-Capillary 154 (*) 70 - 99 (mg/dL)    Comment 1 Notify RN     GLUCOSE, CAPILLARY     Status: Abnormal   Collection Time   12/06/11  4:54 PM      Component Value Range Comment   Glucose-Capillary 113 (*) 70 - 99 (mg/dL)    Comment 1 Notify RN     GLUCOSE, CAPILLARY     Status: Abnormal   Collection Time   12/06/11  8:48 PM      Component Value Range Comment   Glucose-Capillary 213 (*) 70 - 99 (mg/dL)    Comment 1 Notify RN     GLUCOSE, CAPILLARY     Status: Normal   Collection Time   12/07/11  7:17 AM      Component Value Range Comment   Glucose-Capillary 71  70 - 99 (mg/dL)    Comment 1 Notify RN     GLUCOSE, CAPILLARY     Status: Abnormal   Collection Time   12/07/11 11:50 AM      Component Value Range Comment   Glucose-Capillary 105 (*) 70 - 99 (mg/dL)    Comment 1 Notify RN     GLUCOSE, CAPILLARY     Status: Abnormal   Collection Time   12/07/11  4:20 PM      Component Value Range Comment   Glucose-Capillary 100 (*) 70 - 99 (mg/dL)    Comment 1 Notify RN     GLUCOSE, CAPILLARY     Status: Abnormal   Collection Time   12/07/11  9:11 PM      Component Value Range  Comment   Glucose-Capillary 162 (*) 70 - 99 (mg/dL)    Comment 1 Notify RN     GLUCOSE, CAPILLARY     Status: Normal   Collection Time   12/08/11  7:17 AM      Component Value Range Comment   Glucose-Capillary 74  70 - 99 (mg/dL)    Comment 1 Notify RN     GLUCOSE, CAPILLARY     Status: Abnormal   Collection Time   12/08/11 12:12 PM      Component Value Range Comment   Glucose-Capillary 129 (*) 70 - 99 (mg/dL)    Comment 1 Notify RN     GLUCOSE, CAPILLARY     Status: Abnormal   Collection Time   12/08/11  4:25 PM      Component Value Range Comment   Glucose-Capillary 111 (*) 70 - 99 (mg/dL)    Comment 1 Notify RN  GLUCOSE, CAPILLARY     Status: Abnormal   Collection Time   12/08/11  9:31 PM      Component Value Range Comment   Glucose-Capillary 102 (*) 70 - 99 (mg/dL)    Comment 1 Notify RN      Physical Exam  Nursing note and vitals reviewed.  Constitutional: He is oriented to person, place, and time. He appears well-developed and well-nourished.  HENT:  Head: Normocephalic.  Eyes: Pupils are equal, round, and reactive to light. EOMI Neck: Normal range of motion. Neck supple.  Cardiovascular: Normal rate.  Pulmonary/Chest:  Decreased breath sounds RLL. Dyspnea with deep inspiration.  Abdominal: Soft. Bowel sounds are normal. He exhibits no distension. There is no tenderness.  Musculoskeletal: He exhibits no edema.  Neurological: He is alert and oriented to person, place, and time. He displays abnormal reflex.   He has 0 out of 5 strength in both lower extremities. Areflexic-BLE. Follows commands  Upper extremity motor  intact.  There is a language barrier. No sensation below the level of injury. Cognitively intact. Skin: Skin is warm and dry.  Wound is clean and intact with steristrips and no drainage. i see no breakdown elsewhere Psychiatric: He has a normal mood and affect. His behavior is normal.  Ext:  No swelling or edema in BLE, no discoloration. Left LH biceps tendon  sore with palp 2/27-exam  Assessment/Plan: 1. 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 Physiatrist is providing close team supervision and 24 hour management of active medical problems listed below. Physiatrist and rehab team continue to assess barriers to discharge/monitor patient progress toward functional and medical goals. FIM: FIM - Bathing Bathing Steps Patient Completed: Chest;Right Arm;Left Arm;Abdomen;Front perineal area;Buttocks;Right upper leg;Left upper leg;Right lower leg (including foot);Left lower leg (including foot) Bathing: 5: Set-up assist to: Obtain items  FIM - Upper Body Dressing/Undressing Upper body dressing/undressing steps patient completed: Thread/unthread right sleeve of pullover shirt/dresss;Thread/unthread left sleeve of pullover shirt/dress;Put head through opening of pull over shirt/dress;Pull shirt over trunk Upper body dressing/undressing: 5: Set-up assist to: Obtain clothing/put away FIM - Lower Body Dressing/Undressing Lower body dressing/undressing steps patient completed: Thread/unthread right pants leg;Thread/unthread left pants leg Lower body dressing/undressing: 2: Max-Patient completed 25-49% of tasks  FIM - Toileting Toileting: 0: Activity did not occur  FIM - Diplomatic Services operational officer Devices: Psychiatrist Transfers: 4-To toilet/BSC: Min A (steadying Pt. > 75%);3-From toilet/BSC: Mod A (lift or lower assist) Toilet Transfers DO NOT USE: 4-To toilet/BSC: Min A (steadying Pt. > 75%)  FIM - Bed/Chair Transfer Bed/Chair Transfer Assistive Devices: Arm rests;Sliding board;Orthosis Bed/Chair Transfer: 4: Bed > Chair or W/C: Min A (steadying Pt. > 75%);4: Chair or W/C > Bed: Min A (steadying Pt. > 75%)  FIM - Locomotion: Wheelchair Distance: 150 Locomotion:  Wheelchair: 6: Travels 150 ft or more, turns around, maneuvers to table, bed or toilet, negotiates 3% grade: maneuvers on rugs and over door sills independently FIM - Locomotion: Ambulation Locomotion: Ambulation Assistive Devices: Other (comment) (currently paraplegic) Locomotion: Ambulation: 0: Activity did not occur  Comprehension Comprehension Mode: Auditory Comprehension: 6-Follows complex conversation/direction: With extra time/assistive device  Expression Expression Mode: Verbal Expression Assistive Devices: 6-Other (Comment) Expression: 6-Expresses complex ideas: With extra time/assistive device  Social Interaction Social Interaction: 6-Interacts appropriately with others with medication or extra time (anti-anxiety, antidepressant).  Problem Solving Problem Solving  Mode: Asleep Problem Solving: 6-Solves complex problems: With extra time  Memory Memory Mode: Asleep Memory: 6-More than reasonable amt of time   Medical Problem List and Plan:  1. T.-11 fracture after fall with complete spinal cord injury. Begin lovenox 2.. DVT Prophylaxis/Anticoagulation: SCDs. . Monitor for any signs of deep vein thrombosis. Dopplers negative   -needs 3 months of proph lovenox from start date 3. Pain management. Lyrica 75 mg twice daily, oxycodone as needed.   -pt continues to denies significant pain  -will try ice to left shoulder and mobic for a couple days before going home 4. Neurogenic bowel:   -getting toward daily to qod/qd routine. Getting up to bsc 5. Diabetes mellitus. Hemoglobin A1c of 10.2. Metformin increased and numbers are better.  -continue lantus 6. Hyperlipidemia. Crestor 7. Neurogenic bladder: IC program to keep volumes between 300 and 500 cc   -volumes improved and pt/wife doing caths LOS (Days) 19 A FACE TO FACE EVALUATION WAS PERFORMED  Willson Lipa T 12/09/2011, 6:33 AM

## 2011-12-09 NOTE — Patient Care Conference (Signed)
Inpatient RehabilitationTeam Conference Note Date: 12/08/2011   Time: 2:10 PM    Patient Name: John Meza      Medical Record Number: [id number]  Date of Birth: 1961/02/12 Sex: Male         Room/Bed: 4001/4001-01 Payor Info: Payor: [employer]  Plan: [employer]  Product Type: *No Product type*     Admitting Diagnosis: SCI - Para  Admit Date/Time:  11/20/2011  3:20 PM Admission Comments: No comment available   Primary Diagnosis:  SCI (spinal cord injury) Principal Problem: SCI (spinal cord injury)  Patient Active Problem List  Diagnoses Date Noted  . SCI (spinal cord injury) 11/20/2011  . 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 Concussion with brief LOC 11/16/2011  . Occipital scalp laceration 11/16/2011  . Accidental fall from ladder 11/16/2011  . DM, UNCOMPLICATED, TYPE II, UNCONTROLLED 05/26/2007  . OBESITY, MODERATE 05/26/2007  . HYPERLIPIDEMIA 05/25/2007  . ALLERGIC RHINITIS 05/25/2007  . DYSPEPSIA 05/25/2007    Expected Discharge Date: Expected Discharge Date: 12/11/11  Team Members Present: Physician: Dr. Faith Rogue Case Manager Present: Melanee Spry, RN Social Worker Present: Amada Jupiter, LCSW Nurse Present: Daryll Brod, RN PT Present: Karolee Stamps, Judith Blonder, PTA OT Present: Edwin Cap, OT Other (Discipline and Name): Tora Duck, PPS Coordinator     Current Status/Progress Goal Weekly Team Focus  Medical   working on cathing program. patient still denies pain.  no neurological changes  see below.  i want patient to paritcipate in caths  see above   Bowel/Bladder   Managed bowel and bladder program  Managed bowel and bladder program  Continue to have pt and wife I&O cath, Have wife perform bowel program   Swallow/Nutrition/ Hydration             ADL's   Supervision with ADLs with extra time, min assist with transfers  Overall min assist  UB/core strengthening, ADL transfers, family education   Mobility   min A overall  transfers, min/mod A with bed mobility, mod I w/c mobility on unit, supervision in community  min A overall, mod I w/c mobility  functional strengthening, community mobility - possible outing?, family ed, finalize d/c planning   Communication             Safety/Cognition/ Behavioral Observations            Pain   Scheduled Lyrica 100mg , Oxy IR 10-20mg  q4hrs  Pain <3  Monitor frequency of prn pain request   Skin   Thoracic insiion with steristrips. Drsg CDI. Opening to gluteal fold with allevyn drsg, healing appropriately  No additional skin breakdown  Continue turning q 2hrs. Assess incision, and opening to fold daily      *See Interdisciplinary Assessment and Plan and progress notes for long and short-term goals  Barriers to Discharge: unchanged    Possible Resolutions to Barriers:  pt ed    Discharge Planning/Teaching Needs:  Home with wife and family to provide 24/7 assistance - education ongoing      Team Discussion: Pt progressing well toward goals.  Family ed going well.  Workers comp Sports coach attended conference--she will be arranging for the DME, supplies, HH requested.   Revisions to Treatment Plan: none    Continued Need for Acute Rehabilitation Level of Care: The patient requires daily medical management by a physician with specialized training in physical medicine and rehabilitation for the following conditions: Daily direction of a multidisciplinary physical rehabilitation program to ensure safe treatment while eliciting  the highest outcome that is of practical value to the patient.: Yes Daily medical management of patient stability for increased activity during participation in an intensive rehabilitation regime.: Yes Daily analysis of laboratory values and/or radiology reports with any subsequent need for medication adjustment of medical intervention for : Post surgical problems;Neurological problems  Brock Ra 12/09/2011, 2:10 PM

## 2011-12-09 NOTE — Progress Notes (Signed)
Patient wife inserted suppository this morning at 0815. Patient put on bedside commode about 0845. Patient wife did dig stimulation and patient had large formed continent bowel movement. Patient wife doing I/O cath every 4 hours at this time with prompting. Patient blood sugars have been stable. To start seeing if patient can have more independence with cath. Patient mod assist with slideboard. No new complaints noted. Pain medicine given x1 at 0957 for pain. Patient wife changed back dressing. Small amount of drainage seen on old pad- incision approximated-steris on.

## 2011-12-09 NOTE — Progress Notes (Signed)
Occupational Therapy Session Note  Patient Details  Name: [name] MRN: [id number] Date of Birth: 29-Jun-1961  Today's Date: 12/09/2011 Time: 0930-1030 Time Calculation (min): 60 min  Short Term Goals = Long Term Goals  Skilled Therapeutic Interventions/Progress Updates:  ADL retraining at bed level. Wife present and engaging in ADL as necessary, especially for set-up assistance. Focused skilled intervention on bed mobility, UB/LB bathing and dressing (focusing on patient donning shoes), overall activity tolerance/endurance, pain management, and discussion/education on B&D supply at home.   Precautions:  Precautions Precautions: Back Precaution Booklet Issued: No Precaution Comments:  (0/5 B LE MMT) Required Braces or Orthoses: Yes Spinal Brace: Thoracolumbosacral orthotic;Applied in supine position Other Brace/Splint: abdominal binder Restrictions Weight Bearing Restrictions: No  Pain: Pain Assessment Pain Assessment: 0-10 Pain Score:   5 Pain Type: Surgical pain Pain Location: Back Pain Orientation: Left Pain Descriptors: Sore Pain Frequency: Intermittent Pain Onset: On-going Pain Intervention(s): Medication (See eMAR)  See FIM for current functional status  Therapy/Group: Individual Therapy  Haydn Hutsell 12/09/2011, 11:22 AM

## 2011-12-09 NOTE — Progress Notes (Signed)
Physical Therapy Session Note  Patient Details  Name: Mckenna Boruff MRN: [id number] Date of Birth: 1961-08-13  Today's Date: 12/09/2011 Time: 1030-1130 Time Calculation (min): 60 min  Short Term Goals= LTGS  Skilled Therapeutic Interventions/Progress Updates: Basic transfer training with slide board and wife assisting with transfer for steady A to close supervision transfer. Dynamic sitting balance activities edge of mat on Aero Disk to increase challenge for trunk control, mod A initially to recover with LOB progressed to intermittent steady A during task to throw and reach outside BOS for bean bag toss. Wii Boxing for balance training using bilateral UEs for controls, supervision overall.     Therapy Documentation Precautions:  Precautions Precautions: Back Precaution Booklet Issued: No Precaution Comments:  (0/5 B LE MMT) Required Braces or Orthoses: Yes Spinal Brace: Thoracolumbosacral orthotic;Applied in supine position Other Brace/Splint: abdominal binder Restrictions Weight Bearing Restrictions: No Pain: Denies pain  See FIM for current functional status  Therapy/Group: Individual Therapy  Karolee Stamps [facility] 12/09/2011, 11:40 AM

## 2011-12-09 NOTE — Progress Notes (Signed)
Patient Details  Name: John Meza MRN: [id number] Date of Birth: 10-Jun-1961  Today's Date: 12/09/2011 Time: 1330-1530 Time Calculation (min): 120 min   Skilled Therapeutic Interventions/Progress Updates: Pt participated in outing to [place] for community mobility training with w/c, safety in the community, and problem solving through situations. Pt able to take items on/off shelves and problem solve through various techniques. pt's daughter and wife present as well. see outing goal sheet for further details.  No c/o pain.  Therapy/Group: Community Reintegration  Activity Level: Moderate:  Level of assist: Supervision  John Meza 12/09/2011, 4:48 PM

## 2011-12-10 LAB — GLUCOSE, CAPILLARY
Glucose-Capillary: 77 mg/dL (ref 70–99)
Glucose-Capillary: 88 mg/dL (ref 70–99)
Glucose-Capillary: 137 mg/dL — ABNORMAL HIGH (ref 70–99)

## 2011-12-10 MED ORDER — DIAZEPAM 5 MG PO TABS: 5.0000 mg | ORAL_TABLET | Freq: Four times a day (QID) | ORAL | Status: AC | PRN

## 2011-12-10 MED ORDER — INSULIN GLARGINE 100 UNIT/ML ~~LOC~~ SOLN: 35.0000 [IU] | Freq: Every day | SUBCUTANEOUS | Status: DC

## 2011-12-10 MED ORDER — CALCIUM POLYCARBOPHIL 625 MG PO TABS: 625.0000 mg | ORAL_TABLET | Freq: Every day | ORAL | Status: DC

## 2011-12-10 MED ORDER — BISACODYL 10 MG RE SUPP: 10.0000 mg | Freq: Every day | RECTAL | Status: AC

## 2011-12-10 MED ORDER — SENNA 8.6 MG PO TABS: 1.0000 | ORAL_TABLET | Freq: Every day | ORAL | Status: DC

## 2011-12-10 MED ORDER — PANTOPRAZOLE SODIUM 40 MG PO TBEC: 40.0000 mg | DELAYED_RELEASE_TABLET | Freq: Every day | ORAL | Status: DC

## 2011-12-10 MED ORDER — BISACODYL 5 MG PO TBEC: 5.0000 mg | DELAYED_RELEASE_TABLET | Freq: Every day | ORAL | Status: AC | PRN

## 2011-12-10 MED ORDER — MELOXICAM 7.5 MG PO TABS: 7.5000 mg | ORAL_TABLET | Freq: Every day | ORAL | Status: DC

## 2011-12-10 MED ORDER — ENOXAPARIN SODIUM 30 MG/0.3ML ~~LOC~~ SOLN: 30.0000 mg | Freq: Two times a day (BID) | SUBCUTANEOUS | Status: DC

## 2011-12-10 MED ORDER — OXYCODONE HCL 10 MG PO TABS: 10.0000 mg | ORAL_TABLET | ORAL | Status: AC | PRN

## 2011-12-10 MED ORDER — PREGABALIN 100 MG PO CAPS: 100.0000 mg | ORAL_CAPSULE | Freq: Two times a day (BID) | ORAL | Status: DC

## 2011-12-10 NOTE — Progress Notes (Signed)
Patient ID: John Meza, male   DOB: 01/11/1961, 51 y.o.   MRN: [id number] Pt & wife progressing well toward goals & completing family education w/ d/c date 12/11/11.  Pam Teague RNCM w/ pt's Workers Comp is arranging d/c DME & HH & general supplies requested for pt's care at home.  She has also arranged for pharmacy access.

## 2011-12-10 NOTE — Progress Notes (Signed)
Physical Therapy Session Note  Patient Details  Name: Jaki Hammerschmidt MRN: [id number] Date of Birth: April 14, 1961  Today's Date: 12/10/2011 Time: 1030-1100 Time Calculation (min): 30 min  Short Term Goals = LTGs  Skilled Therapeutic Interventions/Progress Updates:  Tx focused on sliding board transfers up/downhill with wife assisting and dynamic stability edge of mat for increased postural stability and decreased risk of falls. Mat<>WC transfers with sliding board Min A from wife with safe technique and excellent problem solving. Bil feet supported edge of mat dynamic balance with weighted ball toss, bat hitting ball, and basketball with close S assist. Pt able to safely catch himself outside BOS with UEs. WC pushups x10 for UE strengthening.      Therapy Documentation Precautions:  Precautions Precautions: Back Precaution Booklet Issued: No Precaution Comments:  (0/5 B LE MMT) Required Braces or Orthoses: Yes Spinal Brace: Thoracolumbosacral orthotic;Applied in supine position Other Brace/Splint: abdominal binder Restrictions Weight Bearing Restrictions: No    Pain: No c/o pain     See FIM for current functional status  Therapy/Group: Individual Therapy  Iona Coach 12/10/2011, 11:03 AM

## 2011-12-10 NOTE — Progress Notes (Signed)
Physical Therapy Treatment Note & Discharge Summary  Patient Details  Name: John Meza MRN: [id number] Date of Birth: 06-Aug-1961  Today's Date: 12/10/2011  Treatment #1: Time: 1100-1157 Time Calculation (min): 57 min Individual therapy: Treatment focused on family education for car transfer with slideboard (pt at min A level, 2 family members for safety), breakdown of w/c parts, and bumping w/c up/down curb step and multiple steps. Daughter and wife return demonstrated techniques. Pt became nauseas and vomited end of session. Notified RN, pt given ginger ale and crackers.   Treatment #2: Time: 1345-1430 Time calculation (min): 45 minutes  Individual therapy: UE strengthening with weighted ball on rebounder with yellow and progressed to blue ball from w/c without armrests for support. Supervision transfer to/from mat with slide board for safety. Dynamic sitting balance activity for basketball toss, bounce/catch, and hitting beach ball with weighted bar (4 #) with overall supervision. Pt with excellent participation.  Patient has met 6 of 6 long term goals due to improved activity tolerance, improved balance, improved postural control and ability to compensate for deficits.  Patient to discharge at a wheelchair level Supervision/minA.   Patient's care partner is independent to provide the necessary physical assistance at discharge. Pt's wife and daughter have gone through extensive family education successfully.  Reasons goals not met: n/a  Recommendation:  Patient will benefit from ongoing skilled PT services in home health setting to continue to advance safe functional mobility, address ongoing impairments in balance, strength, functional mobility, and minimize fall risk.  Equipment: Specialty w/c through [employer], long slide board, leg loops  Reasons for discharge: treatment goals met and discharge from hospital  Patient/family agrees with progress made and goals achieved:  Yes  PT Discharge Precautions/Restrictions Precautions Precautions: Fall;Back Required Braces or Orthoses: Yes Spinal Brace: Thoracolumbosacral orthotic;Applied in supine position Other Brace/Splint: abdominal binder Restrictions Weight Bearing Restrictions: No Pain Pain Assessment Pain Assessment: No/denies pain Vision/Perception  Vision - History Baseline Vision: Wears glasses only for reading Patient Visual Report: No change from baseline Vision - Assessment Eye Alignment: Within Functional Limits Perception Perception: Within Functional Limits  Cognition Overall Cognitive Status: Appears within functional limits for tasks assessed Arousal/Alertness: Awake/alert Orientation Level: Oriented X4 Attention: Divided Selective Attention: Appears intact Divided Attention: Appears intact Memory: Appears intact Awareness: Appears intact Problem Solving: Appears intact Safety/Judgment: Appears intact Sensation Sensation Light Touch: Impaired Detail Light Touch Impaired Details: Absent RLE;Absent LLE Proprioception: Impaired Detail Proprioception Impaired Details: Impaired RLE;Impaired LLE Additional Comments: UEs appear intact Coordination Gross Motor Movements are Fluid and Coordinated: No ((lower extremities)) Fine Motor Movements are Fluid and Coordinated: Yes (UEs) Motor  Motor Motor: Paraplegia   Trunk/Postural Assessment  Cervical Assessment Cervical Assessment: Within Functional Limits Thoracic Assessment Thoracic Assessment: Within Functional Limits Lumbar Assessment Lumbar Assessment: Exceptions to [facility] (back precautions and TLSO) Postural Control Postural Control: Within Functional Limits  Balance Balance Balance Assessed: Yes Static Sitting Balance Static Sitting - Level of Assistance: 6: Modified independent (Device/Increase time) Dynamic Sitting Balance Dynamic Sitting - Level of Assistance: 5: Stand by assistance Extremity Assessment  RUE  Assessment RUE Assessment: Within Functional Limits LUE Assessment LUE Assessment: Within Functional Limits RLE Assessment RLE Assessment: Exceptions to WFL (0/5 strength, ROM WFL) LLE Assessment LLE Assessment: Exceptions to [facility] (ROM WFL, 0/5 strength)  See FIM for current functional status  Karolee Stamps [facility] 12/10/2011, 12:46 PM

## 2011-12-10 NOTE — Progress Notes (Signed)
Patient ID: John Meza, male   DOB: [birth year], 51 y.o.   MRN: [id number] Subjective/Complaints:  Review of Systems  Unable to perform ROS: language  2/28- no new complaints BP Readings from Last 3 Encounters:  12/10/11 107/71  11/20/11 124/71  11/20/11 124/71    Vital Signs: Blood pressure 107/71, pulse 69, temperature 98.5 F (36.9 C), temperature source Oral, resp. rate 20, weight 82.3 kg (181 lb 7 oz), SpO2 97.00%. No results found. Results for orders placed during the hospital encounter of 11/20/11 (from the past 72 hour(s))  GLUCOSE, CAPILLARY     Status: Normal   Collection Time   12/07/11  7:17 AM      Component Value Range Comment   Glucose-Capillary 71  70 - 99 (mg/dL)    Comment 1 Notify RN     GLUCOSE, CAPILLARY     Status: Abnormal   Collection Time   12/07/11 11:50 AM      Component Value Range Comment   Glucose-Capillary 105 (*) 70 - 99 (mg/dL)    Comment 1 Notify RN     GLUCOSE, CAPILLARY     Status: Abnormal   Collection Time   12/07/11  4:20 PM      Component Value Range Comment   Glucose-Capillary 100 (*) 70 - 99 (mg/dL)    Comment 1 Notify RN     GLUCOSE, CAPILLARY     Status: Abnormal   Collection Time   12/07/11  9:11 PM      Component Value Range Comment   Glucose-Capillary 162 (*) 70 - 99 (mg/dL)    Comment 1 Notify RN     GLUCOSE, CAPILLARY     Status: Normal   Collection Time   12/08/11  7:17 AM      Component Value Range Comment   Glucose-Capillary 74  70 - 99 (mg/dL)    Comment 1 Notify RN     GLUCOSE, CAPILLARY     Status: Abnormal   Collection Time   12/08/11 12:12 PM      Component Value Range Comment   Glucose-Capillary 129 (*) 70 - 99 (mg/dL)    Comment 1 Notify RN     GLUCOSE, CAPILLARY     Status: Abnormal   Collection Time   12/08/11  4:25 PM      Component Value Range Comment   Glucose-Capillary 111 (*) 70 - 99 (mg/dL)    Comment 1 Notify RN     GLUCOSE, CAPILLARY     Status: Abnormal   Collection Time   12/08/11  9:31 PM   Component Value Range Comment   Glucose-Capillary 102 (*) 70 - 99 (mg/dL)    Comment 1 Notify RN     GLUCOSE, CAPILLARY     Status: Normal   Collection Time   12/09/11  7:18 AM      Component Value Range Comment   Glucose-Capillary 76  70 - 99 (mg/dL)    Comment 1 Notify RN     GLUCOSE, CAPILLARY     Status: Normal   Collection Time   12/09/11 11:44 AM      Component Value Range Comment   Glucose-Capillary 91  70 - 99 (mg/dL)    Comment 1 Notify RN     GLUCOSE, CAPILLARY     Status: Abnormal   Collection Time   12/09/11  3:56 PM      Component Value Range Comment   Glucose-Capillary 130 (*) 70 - 99 (mg/dL)    Comment 1  Notify RN     GLUCOSE, CAPILLARY     Status: Abnormal   Collection Time   12/09/11  9:24 PM      Component Value Range Comment   Glucose-Capillary 122 (*) 70 - 99 (mg/dL)    Physical Exam  Nursing note and vitals reviewed.  Constitutional: He is oriented to person, place, and time. He appears well-developed and well-nourished.  HENT:  Head: Normocephalic.  Eyes: Pupils are equal, round, and reactive to light. EOMI Neck: Normal range of motion. Neck supple.  Cardiovascular: Normal rate.  Pulmonary/Chest: chest clear Abdominal: Soft. Bowel sounds are normal. He exhibits no distension. There is no tenderness.  Musculoskeletal: He exhibits no edema.  Neurological: He is alert and oriented to person, place, and time. He displays abnormal reflex.   He has 0 out of 5 strength in both lower extremities. Areflexic-BLE. Follows commands  Upper extremity motor  intact.  There is a language barrier. No sensation below the level of injury. Cognitively intact. No emerging tone in either leg. Easily moveable Skin: Skin is warm and dry.  Wound is clean and intact with steristrips and no drainage. i see no breakdown elsewhere Psychiatric: He has a normal mood and affect. His behavior is normal.  Ext:  No swelling or edema in BLE, no discoloration. Left LH biceps tendon sore with  palp 2/28-exam  Assessment/Plan: 1. 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 Physiatrist is providing close team supervision and 24 hour management of active medical problems listed below. Physiatrist and rehab team continue to assess barriers to discharge/monitor patient progress toward functional and medical goals.  Home tomorrow  FIM: FIM - Bathing Bathing Steps Patient Completed: Chest;Right Arm;Left Arm;Abdomen;Buttocks;Front perineal area;Right upper leg;Left upper leg;Right lower leg (including foot);Left lower leg (including foot) Bathing: 5: Set-up assist to: Obtain items  FIM - Upper Body Dressing/Undressing Upper body dressing/undressing steps patient completed: Thread/unthread right sleeve of pullover shirt/dresss;Thread/unthread left sleeve of pullover shirt/dress;Put head through opening of pull over shirt/dress;Pull shirt over trunk Upper body dressing/undressing: 5: Set-up assist to: Obtain clothing/put away FIM - Lower Body Dressing/Undressing Lower body dressing/undressing steps patient completed: Thread/unthread right pants leg;Thread/unthread left pants leg Lower body dressing/undressing: 1: Total-Patient completed less than 25% of tasks  FIM - Toileting Toileting: 1: Two helpers  FIM - Diplomatic Services operational officer Devices: Human resources officer Transfers: 3-To toilet/BSC: Mod A (lift or lower assist) Toilet Transfers DO NOT USE: 4-To toilet/BSC: Min A (steadying Pt. > 75%)  FIM - Banker Devices: Sliding board Bed/Chair Transfer: 3: Sit > Supine: Mod A (lifting assist/Pt. 50-74%/lift 2 legs);3: Bed > Chair or W/C: Mod A (lift or lower assist);3: Chair or W/C > Bed: Mod A (lift or lower assist)  FIM - Locomotion:  Wheelchair Distance: 150 Locomotion: Wheelchair: 6: Travels 150 ft or more, turns around, maneuvers to table, bed or toilet, negotiates 3% grade: maneuvers on rugs and over door sills independently FIM - Locomotion: Ambulation Locomotion: Ambulation Assistive Devices: Other (comment) (currently paraplegic) Locomotion: Ambulation: 0: Activity did not occur  Comprehension Comprehension Mode: Auditory Comprehension: 5-Understands complex 90% of the time/Cues < 10% of the time  Expression Expression Mode: Verbal Expression Assistive Devices: 6-Other (Comment) Expression: 5-Expresses complex 90% of the time/cues < 10% of the time  Social Interaction Social Interaction: 7-Interacts appropriately with others -  No medications needed.  Problem Solving Problem Solving Mode: Asleep Problem Solving: 5-Solves complex 90% of the time/cues < 10% of the time  Memory Memory Mode: Asleep Memory: 5-Recognizes or recalls 90% of the time/requires cueing < 10% of the time   Medical Problem List and Plan:  1. T.-11 fracture after fall with complete spinal cord injury. Begin lovenox 2.. DVT Prophylaxis/Anticoagulation: SCDs. . Monitor for any signs of deep vein thrombosis. Dopplers negative   -needs 3 months of proph lovenox from start date 3. Pain management. Lyrica 75 mg twice daily, oxycodone as needed.   -pt continues to denies significant pain  -will try ice to left shoulder and mobic for a couple days before going home 4. Neurogenic bowel:   -getting toward daily to qod/qd routine. Getting up to bsc 5. Diabetes mellitus. Hemoglobin A1c of 10.2. Metformin increased with improved control  -continue lantus 6. Hyperlipidemia. Crestor 7. Neurogenic bladder: IC program to keep volumes between 300 and 500 cc   -volumes improved and pt/wife doing caths LOS (Days) 20 A FACE TO FACE EVALUATION WAS PERFORMED  Cristian Davitt T 12/10/2011, 6:56 AM

## 2011-12-10 NOTE — Progress Notes (Signed)
Occupational Therapy Session Notes and Discharge Summary  Patient Details  Name: John Meza MRN: [id number] Date of Birth: 02-01-1961  Today's Date: 12/10/2011  SESSION NOTES  Session #1 0930-1030 - 60 Minutes Individual Therapy No complaints of pain ADL retraining at bed level in supine position with HOB up/down prn. Wife present during entire session and providing patient with set-up assistance for bathing and dressing routine at bed level. Encouraged wife to give patient time to perform bathing and  dressing himself; stating she could hand him the necessary clothes and walk away. Patient continues to need assist with donning brief secondary to incontinent bowel movements at times, donning TED hose, donning abdominal binder & TLSO, and donning leg loops to bilateral LEs. Wife able to assist with these aspects.  Patient and wife completed bed mobility and transfer into w/c without assistance from therapist.   Session #2 [id number] - 45 Minutes Individual Therapy No complaints of pain First engaged in drop arm BSC transfer using slide board. Patients wife performed transfer <->; patient required steady assist for transfer. Educated patient and wife on importance of using towel or bed pad for bare bottom slide board transfers. Both agreed. Then engaged in therapeutic activity in therapy gym focusing on slide board transfers <-> therapy mat, dynamic sitting balance/tolderance/endurance, and UE strengthening exercises.   DISCHARGE SUMMARY  Patient has met 6 of 6 long term goals due to improved activity tolerance, improved balance, postural control, ability to compensate for deficits, improved awareness and improved coordination.  Patient to discharge at overall Supervision to min assist level.  Patient's care partner is independent to provide the necessary physical assistance at discharge.    Reasons goals not met: N/A; all goals met at this time.  Recommendation:  Patient will benefit from  ongoing skilled OT services in home health setting to continue to advance functional skills in the area of BADL and iADL.  Equipment: Drop Arm BSC and Tub Transfer Bench  Reasons for discharge: treatment goals met and discharge from hospital  Patient/family agrees with progress made and goals achieved: Yes  Precautions/Restrictions  Precautions Precautions: Fall;Back Required Braces or Orthoses: Yes Spinal Brace: Thoracolumbosacral orthotic;Applied in supine position Restrictions Weight Bearing Restrictions: No  ADL - See FIM  Vision/Perception  Vision - History Baseline Vision: Wears glasses only for reading Patient Visual Report: No change from baseline Vision - Assessment Eye Alignment: Within Functional Limits   Cognition Overall Cognitive Status: Appears within functional limits for tasks assessed Arousal/Alertness: Awake/alert Orientation Level: Oriented X4 Attention: Divided Selective Attention: Appears intact Divided Attention: Appears intact Memory: Appears intact Awareness: Appears intact Problem Solving: Appears intact Safety/Judgment: Appears intact  Sensation Sensation Additional Comments: UEs appear intact Coordination Gross Motor Movements are Fluid and Coordinated: Yes (UEs) Fine Motor Movements are Fluid and Coordinated: Yes (UEs)  Extremity/Trunk Assessment RUE Assessment RUE Assessment: Within Functional Limits LUE Assessment LUE Assessment: Within Functional Limits  See FIM for current functional status  [name] 12/10/2011, 12:13 PM

## 2011-12-10 NOTE — Progress Notes (Signed)
Suppository given at 08:15 by pt's wife. Pt transferred to bsc at 08:45. Dig stim performed, resulting in a bowel movement. Stool, small, formed. Wife performed bowel program, as well as dressing change, and assised with transfer to bsc. Education material provided (i.e clean catch catherization, blood glucose monitoring, adding fiber to meal, carb counting for people with diabetes, weight loss tips, wound infection, nausea and voimiting, how and where to give insulin injections, urinary tract infections, spinal cord injury, etc) in pt's primary language spanish for eduction and discharge reference material.

## 2011-12-10 NOTE — Progress Notes (Signed)
Therapeutic Recreation Discharge Summary Patient Details  Name: John Meza MRN: [id number] Date of Birth: April 09, 1961  Long term goals set: 2  Long term goals met: 2  Comments on progress toward goals: Pt has made excellent progress toward goals meeting Supervision level for community reintegration and for moderately complex TR tasks.  Pt is able to complete simple TR tasks w/c level with Mod and perform w/c mobility in a controlled environment with Mod I. Pt with significant improvements in functional mobility, balance, and overall safety during all tasks.  Pt's family has been present and participatory in all sessions and demonstrates good safety and judgement with assisting pt.  Pt is ready for discharge home with family tomorrow.  Reasons goals not met: n/a  Equipment acquired: n/a  Reasons for discharge: discharge from hospital  Patient/family agrees with progress made and goals achieved: Yes  [name] 12/10/2011, 8:35 AM

## 2011-12-11 DIAGNOSIS — Z5189 Encounter for other specified aftercare: Secondary | ICD-10-CM

## 2011-12-11 DIAGNOSIS — W11XXXA Fall on and from ladder, initial encounter: Secondary | ICD-10-CM

## 2011-12-11 DIAGNOSIS — IMO0002 Reserved for concepts with insufficient information to code with codable children: Secondary | ICD-10-CM

## 2011-12-11 DIAGNOSIS — K592 Neurogenic bowel, not elsewhere classified: Secondary | ICD-10-CM

## 2011-12-11 DIAGNOSIS — N319 Neuromuscular dysfunction of bladder, unspecified: Secondary | ICD-10-CM

## 2011-12-11 LAB — GLUCOSE, CAPILLARY: Glucose-Capillary: 86 mg/dL (ref 70–99)

## 2011-12-11 NOTE — Discharge Summary (Signed)
John Meza, John Meza                ACCOUNT NO.:  [id number]  MEDICAL RECORD NO.:  [id number]  LOCATION:  4001                         FACILITY:  [facility]  PHYSICIAN:  Ranelle Oyster, M.D.DATE OF BIRTH:  10-12-1961  DATE OF ADMISSION:  11/20/2011 DATE OF DISCHARGE:  12/11/2011                              DISCHARGE SUMMARY   DISCHARGE DIAGNOSES: 1. Thoracic T11 fracture after fall with complete spinal cord injury. 2. Subcutaneous Lovenox for deep vein thrombosis prophylaxis. 3. Pain management. 4. Neurogenic bowel and bladder. 5. Diabetes mellitus. 6. Hyperlipidemia.  A 51 year old Hispanic right-handed male with diabetes mellitus, who fell while at work on November 16, 2011 when he lost his balance, fell 10 feet, questionable loss of consciousness.  The patient was with inability to move bilateral lower extremities and numbness and pain, posterior head.  X-rays in the emergency department with comminuted thoracic T11 fracture with retropulsion and anterior translation of the spinal canal with near obliteration of central canal as well as left lumbar L1 and lumbar L2 transverse process fractures.  Cranial CT scan negative.  Evaluated by Dr. Lovell Sheehan.  Underwent thoracic T9-L1 posterior lateral arthrocentesis with bone graft on November 18, 2011. Postoperative hypoxia requiring BiPAP.  Latest chest x-ray with improved basilar atelectasis.  Fitted with TLSO back brace when out of bed. Foley catheter tube remained in place.  Sequential compression devices for deep vein thrombosis prophylaxis.  The patient was admitted for comprehensive rehab program.  PAST MEDICAL HISTORY:  See discharge diagnoses.  ALLERGIES:  None.  SOCIAL HISTORY:  He lives with his wife, 1-level home, 3 steps to entry.  FUNCTIONAL HISTORY:  Prior to admission was independent.  FUNCTIONAL STATUS:  Upon admission to Rehab Services was +2 total assist to scoot to the edge of bed, +2 total assist for lateral  scoot transfers.  PHYSICAL EXAMINATION:  VITAL SIGNS:  Blood pressure 126/84, pulse 92, temperature 99, respirations 18. GENERAL:  This was an alert male who speaks very little [place], oriented x3 through an interpreter. LUNGS:  Clear to auscultation. CARDIAC:  Regular rate and rhythm. ABDOMEN:  Soft, nontender.  Good bowel sounds. NEUROLOGIC:  Noted decreased sensation at T11-T12 level.  0/5 strength bilateral lower extremities.  Areflexic of bilateral lower extremities.  REHABILITATION HOSPITAL COURSE:  The patient was admitted to Inpatient Rehab Services with therapies initiated on a 3-hour daily basis consisting of physical therapy, occupational therapy, and rehabilitation nursing.  The following issues were addressed during the patient's rehabilitation stay.  Pertaining to John Meza' thoracic T11 fracture after fall, complete spinal cord injury was stabilized per Dr. Tressie Stalker of Neurosurgery.  He was wearing a TLSO brace when out of bed. Subcutaneous Lovenox for deep vein thrombosis prophylaxis with no bleeding episodes noted.  Pain management ongoing with the use of Lyrica 100 mg twice daily as well as oxycodone for breakthrough pain. Neurogenic bowel and bladder.  His Foley catheter tube had since been removed.  He was receiving intermittent catheterizations every 6 hours with full education provided to his family as well as established bowel program.  He had no nausea or vomiting and monitored.  He did have a history of diabetes mellitus with  hemoglobin A1c of 10.2.  He was on his insulin therapy as well as Glucophage 1000 mg twice daily and again received education in regards to his diabetes mellitus.  He continued on Crestor for hyperlipidemia.  The patient received weekly collaborative interdisciplinary team conferences to discuss estimated length of stay, family teaching, and any barriers to his discharge.  Management of bowel and bladder program as discussed.   He was supervision for activities of daily living with extra time for upper body, minimal assistance for transfers, minimal assist overall transfers, minimum to moderate assist with bed mobility and modified independent wheelchair mobility on the unit as well as supervision in the community.  Full family teaching was completed.  His family could provide the necessary care at home.  All arrangements were made as well as equipment needs, and he was discharged to home on December 11, 2011.  DISCHARGE MEDICATIONS:  Included: 1. Tylenol as needed. 2. Dulcolax tablets 5 mg daily as needed. 3. Dulcolax suppository daily. 4. Valium 5 mg every 6 hours as needed spasms. 5. Lantus insulin 35 units at bedtime. 6. Mobic 7.5 mg daily-discontinued 7. Glucophage 1000 mg twice daily. 8. Niacin 1000 mg at bedtime. 9. Oxycodone immediate release 10-20 mg every 4 hours as needed pain. 10.Protonix 40 mg daily. 11.FiberCon 625 mg daily, although was discussed as needed depending     on the firmness of his stool. 12.Lyrica 100 mg twice daily. 13.Crestor 20 mg daily. 14.Senokot tablets 1 tablet by mouth daily.  DIET:  His diet was a diabetic diet.  SPECIAL INSTRUCTIONS:  Continue back brace as advised when out of bed. The patient will follow up with Dr. Faith Rogue at the outpatient rehab service office, appointment to be made; Dr. Tressie Stalker, [phone], Neurosurgery.  Follow up with [facility], medical provider for ongoing medical management.  Home health therapies had been arranged.  Continued intermittent catheterizations every 6 hours.     Mariam Dollar, P.A.   ______________________________ Ranelle Oyster, M.D.    DA/MEDQ  D:  12/11/2011  T:  12/11/2011  Job:  [id number]  cc:   Ranelle Oyster, M.D. Cristi Loron, M.D. Trauma Services Dr. Vernona Rieger

## 2011-12-11 NOTE — Progress Notes (Signed)
Social Work  Discharge Note  The overall goal for the admission was met for:   Discharge location: Yes Home with wife and children  Length of Stay: Yes - 21 days  Discharge activity level: Yes - mod I w/c level  Home/community participation: Yes  Services provided included: MD, RD, PT, OT, RN, CM, TR, Pharmacy and SW  Financial Services: Worker's Comp  Follow-up services arranged: Home Health: RN, OT, PT via [facility], DME: w/c and cushion via [facility] plus hospital bed, drop arm commode, tub benc via [facility] and Patient/Family has no preference for HH/DME agencies  Comments (or additional information): All f/u, DME and choice of agency providers handled by Workers Comp CM Elita Quick Teague).  This SW also provided (in [place]) handouts and review of the [facility], the [employer], local Signal 3 program and the [employer].  Patient/Family verbalized understanding of follow-up arrangements: Yes  Individual responsible for coordination of the follow-up plan: Pt (with translation assistance via daughters)  Confirmed correct DME delivered: John Meza 12/11/2011    John Meza

## 2011-12-11 NOTE — Progress Notes (Signed)
Patient ID: John Meza, male   DOB: 1961-07-13, 51 y.o.   MRN: [id number] Patient ID: John Meza, male   DOB: 1961-05-08, 51 y.o.   MRN: [id number] Subjective/Complaints:  Review of Systems  Unable to perform ROS: language  3/1- no complaints. Excited to go home today. BP Readings from Last 3 Encounters:  12/11/11 107/70  11/20/11 124/71  11/20/11 124/71    Vital Signs: Blood pressure 107/70, pulse 67, temperature 98.4 F (36.9 C), temperature source Oral, resp. rate 20, weight 82.3 kg (181 lb 7 oz), SpO2 96.00%. No results found. Results for orders placed during the hospital encounter of 11/20/11 (from the past 72 hour(s))  GLUCOSE, CAPILLARY     Status: Abnormal   Collection Time   12/08/11 12:12 PM      Component Value Range Comment   Glucose-Capillary 129 (*) 70 - 99 (mg/dL)    Comment 1 Notify RN     GLUCOSE, CAPILLARY     Status: Abnormal   Collection Time   12/08/11  4:25 PM      Component Value Range Comment   Glucose-Capillary 111 (*) 70 - 99 (mg/dL)    Comment 1 Notify RN     GLUCOSE, CAPILLARY     Status: Abnormal   Collection Time   12/08/11  9:31 PM      Component Value Range Comment   Glucose-Capillary 102 (*) 70 - 99 (mg/dL)    Comment 1 Notify RN     GLUCOSE, CAPILLARY     Status: Normal   Collection Time   12/09/11  7:18 AM      Component Value Range Comment   Glucose-Capillary 76  70 - 99 (mg/dL)    Comment 1 Notify RN     GLUCOSE, CAPILLARY     Status: Normal   Collection Time   12/09/11 11:44 AM      Component Value Range Comment   Glucose-Capillary 91  70 - 99 (mg/dL)    Comment 1 Notify RN     GLUCOSE, CAPILLARY     Status: Abnormal   Collection Time   12/09/11  3:56 PM      Component Value Range Comment   Glucose-Capillary 130 (*) 70 - 99 (mg/dL)    Comment 1 Notify RN     GLUCOSE, CAPILLARY     Status: Abnormal   Collection Time   12/09/11  9:24 PM      Component Value Range Comment   Glucose-Capillary 122 (*) 70 - 99 (mg/dL)   GLUCOSE,  CAPILLARY     Status: Normal   Collection Time   12/10/11  7:16 AM      Component Value Range Comment   Glucose-Capillary 77  70 - 99 (mg/dL)    Comment 1 Notify RN     GLUCOSE, CAPILLARY     Status: Normal   Collection Time   12/10/11 12:03 PM      Component Value Range Comment   Glucose-Capillary 88  70 - 99 (mg/dL)    Comment 1 Notify RN     GLUCOSE, CAPILLARY     Status: Abnormal   Collection Time   12/10/11  4:30 PM      Component Value Range Comment   Glucose-Capillary 137 (*) 70 - 99 (mg/dL)    Comment 1 Notify RN     GLUCOSE, CAPILLARY     Status: Normal   Collection Time   12/10/11  9:07 PM      Component Value Range Comment  Glucose-Capillary 99  70 - 99 (mg/dL)    Comment 1 Notify RN     GLUCOSE, CAPILLARY     Status: Abnormal   Collection Time   12/11/11  7:13 AM      Component Value Range Comment   Glucose-Capillary 62 (*) 70 - 99 (mg/dL)    Comment 1 Notify RN      Physical Exam  Nursing note and vitals reviewed.  Constitutional: He is oriented to person, place, and time. He appears well-developed and well-nourished.  HENT:  Head: Normocephalic.  Eyes: Pupils are equal, round, and reactive to light. EOMI Neck: Normal range of motion. Neck supple.  Cardiovascular: Normal rate.  Pulmonary/Chest: chest clear Abdominal: Soft. Bowel sounds are normal. He exhibits no distension. There is no tenderness.  Musculoskeletal: He exhibits no edema.  Neurological: He is alert and oriented to person, place, and time. He displays abnormal reflex.   He has 0 out of 5 strength in both lower extremities. Areflexic-BLE. Follows commands  Upper extremity motor  intact.  There is a language barrier. No sensation below the level of injury. Cognitively intact. No emerging tone in either leg. Easily moveable Skin: Skin is warm and dry.  Wound is clean and intact with steristrips and no drainage. i see no breakdown elsewhere Psychiatric: He has a normal mood and affect. His behavior is  normal.  Ext:  No swelling or edema in BLE, no discoloration. Left LH biceps tendon sore with palp 3/1-exam  Assessment/Plan: 1. 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 Physiatrist is providing close team supervision and 24 hour management of active medical problems listed below. Physiatrist and rehab team continue to assess barriers to discharge/monitor patient progress toward functional and medical goals.  Home today.  F/u arranged. i'll see him back in about a month  FIM: FIM - Bathing Bathing Steps Patient Completed: Chest;Right Arm;Left Arm;Abdomen;Front perineal area;Buttocks;Right upper leg;Left upper leg;Right lower leg (including foot);Left lower leg (including foot) Bathing: 5: Supervision: Safety issues/verbal cues  FIM - Upper Body Dressing/Undressing Upper body dressing/undressing steps patient completed: Thread/unthread right sleeve of pullover shirt/dresss;Thread/unthread left sleeve of pullover shirt/dress;Pull shirt over trunk;Put head through opening of pull over shirt/dress Upper body dressing/undressing: 5: Set-up assist to: Obtain clothing/put away FIM - Lower Body Dressing/Undressing Lower body dressing/undressing steps patient completed: Thread/unthread right pants leg;Thread/unthread left pants leg;Pull pants up/down;Don/Doff right shoe;Don/Doff left shoe;Fasten/unfasten right shoe;Fasten/unfasten left shoe Lower body dressing/undressing: 5: Supervision: Safety issues/verbal cues  FIM - Toileting Toileting: 0: Activity did not occur  FIM - Diplomatic Services operational officer Devices: Bedside commode Toilet Transfers: 4-To toilet/BSC: Min A (steadying Pt. > 75%) Toilet Transfers DO NOT USE: 4-To toilet/BSC: Min A (steadying Pt. > 75%)  FIM - Banker  Devices: Sliding board;Arm rests;Bed rails Bed/Chair Transfer: 5: Supine > Sit: Supervision (verbal cues/safety issues);4: Sit > Supine: Min A (steadying pt. > 75%/lift 1 leg);4: Bed > Chair or W/C: Min A (steadying Pt. > 75%);4: Chair or W/C > Bed: Min A (steadying Pt. > 75%)  FIM - Locomotion: Wheelchair Distance: 200 Locomotion: Wheelchair: 6: Travels 150 ft or more, turns around, maneuvers to table, bed or toilet, negotiates 3% grade: maneuvers on rugs and over door sills independently FIM - Locomotion: Ambulation Locomotion: Ambulation Assistive Devices: Other (comment) (currently paraplegic) Locomotion: Ambulation: 0: Activity did not  occur  Comprehension Comprehension Mode: Auditory Comprehension: 6-Follows complex conversation/direction: With extra time/assistive device  Expression Expression Mode: Verbal Expression Assistive Devices: 6-Other (Comment) Expression: 6-Expresses complex ideas: With extra time/assistive device  Social Interaction Social Interaction: 6-Interacts appropriately with others with medication or extra time (anti-anxiety, antidepressant).  Problem Solving Problem Solving Mode: Asleep Problem Solving: 6-Solves complex problems: With extra time  Memory Memory Mode: Asleep Memory: 6-More than reasonable amt of time   Medical Problem List and Plan:  1. T.-11 fracture after fall with complete spinal cord injury.  2.. DVT Prophylaxis/Anticoagulation: SCDs. . Monitor for any signs of deep vein thrombosis. Dopplers negative   -needs 3 months of proph lovenox from start date 3. Pain management. Lyrica 75 mg twice daily, oxycodone as needed.   -pt continues to denies significant pain  -will try ice to left shoulder and mobic for a couple days before going home 4. Neurogenic bowel:   -getting toward daily to qod/qd routine. Getting up to bsc. Change fiber to prn. 5. Diabetes mellitus. Hemoglobin A1c of 10.2. Metformin increased with improved  control  -continue lantus 6. Hyperlipidemia. Crestor 7. Neurogenic bladder: IC program to keep volumes between 300 and 500 cc   -volumes improved and pt/wife doing caths LOS (Days) 21 A FACE TO FACE EVALUATION WAS PERFORMED  Tyaisha Cullom T 12/11/2011, 7:42 AM

## 2011-12-11 NOTE — Progress Notes (Signed)
Patient discharged home with family via wheelchair.  Discharge instructions given by Deatra Ina, PA.  Further instruction given on Lantus insulin administration and dressing changes given by nursing staff.  Daughters and wife verbalized understanding.

## 2011-12-11 NOTE — Discharge Instructions (Signed)
Inpatient Rehab Discharge Instructions  Izeyah Deike Discharge date and time: No discharge date for patient encounter.   Activities/Precautions/ Functional Status: Activity: activity as tolerated Diet: diabetic diet Wound Care: none needed Functional status:  ___ No restrictions     ___ Walk up steps independently __x_ 24/7 supervision/assistance   ___ Walk up steps with assistance ___ Intermittent supervision/assistance  ___ Bathe/dress independently ___ Walk with walker     ___ Bathe/dress with assistance ___ Walk Independently    ___ Shower independently ___ Walk with assistance    _x__ Shower with assistance ___ No alcohol     ___ Return to work/school ________   COMMUNITY REFERRALS UPON DISCHARGE:    Home Health:   PT     OT    RN                                 Agency: [facility] Phone:  [phone]   Medical Equipment/Items Ordered: Wheelchair via [facility] @ [phone] bed, commode, tub bench and transfer board via [facility] @ [phone]   GENERAL COMMUNITY RESOURCES FOR PATIENT/FAMILY: [place] [facility]  [employer] [employer] [place] [facility] program  (printed handouts provided)   Special Instructions: TLSO brace when out of bed Continue subcutaneous Lovenox until 03/02/2012 and stopped  My questions have been answered and I understand these instructions. I will adhere to these goals and the provided educational materials after my discharge from the hospital.  Patient/Caregiver Signature _______________________________ Date __________  Clinician Signature _______________________________________ Date __________  Please bring this form and your medication list with you to all your follow-up doctor's appointments.

## 2012-01-06 ENCOUNTER — Encounter: Payer: Self-pay | Admitting: Physical Medicine & Rehabilitation

## 2012-01-06 ENCOUNTER — Encounter
Payer: Worker's Compensation | Attending: Physical Medicine & Rehabilitation | Admitting: Physical Medicine & Rehabilitation

## 2012-01-06 VITALS — BP 120/71 | HR 87 | Ht 64.0 in | Wt 190.0 lb

## 2012-01-06 DIAGNOSIS — R252 Cramp and spasm: Secondary | ICD-10-CM | POA: Insufficient documentation

## 2012-01-06 DIAGNOSIS — E785 Hyperlipidemia, unspecified: Secondary | ICD-10-CM | POA: Insufficient documentation

## 2012-01-06 DIAGNOSIS — E119 Type 2 diabetes mellitus without complications: Secondary | ICD-10-CM | POA: Insufficient documentation

## 2012-01-06 DIAGNOSIS — M549 Dorsalgia, unspecified: Secondary | ICD-10-CM | POA: Insufficient documentation

## 2012-01-06 DIAGNOSIS — K592 Neurogenic bowel, not elsewhere classified: Secondary | ICD-10-CM | POA: Insufficient documentation

## 2012-01-06 DIAGNOSIS — IMO0002 Reserved for concepts with insufficient information to code with codable children: Secondary | ICD-10-CM | POA: Insufficient documentation

## 2012-01-06 DIAGNOSIS — X58XXXS Exposure to other specified factors, sequela: Secondary | ICD-10-CM | POA: Insufficient documentation

## 2012-01-06 DIAGNOSIS — N319 Neuromuscular dysfunction of bladder, unspecified: Secondary | ICD-10-CM | POA: Insufficient documentation

## 2012-01-06 MED ORDER — METHOCARBAMOL 500 MG PO TABS: 500.0000 mg | ORAL_TABLET | Freq: Four times a day (QID) | ORAL | Status: AC | PRN

## 2012-01-06 NOTE — Progress Notes (Signed)
Subjective:    Patient ID: John Meza, male    DOB: December 21, 1960, 51 y.o.   MRN: [id number]  HPI John Meza is back regarding his spinal cord injury.  Therapy is working with him at home. They are addressing on strength training for his upper body.  Therapy is not focusing on mobility at this point. He is doing fairly well with transfers.  He needs supervision only for safety. He needs more help getting in the chair.   He has not seen any changes from a motor or sensory standpoint.   His mood has remained upbeat.   From a pain standpoint, he feels that pain is under reasonable control.  Pain is most prominent when he is lying down, which tends to cause an increase in his back pain. He reports cramping in his legs particularly in his lower legs below the knees. They are happening 3 or 4 times an hour. They are farily mild and don't keep him up at night.  He's not on a muscle relaxant currently.  He is using the oxycodone as needed for breakthrough back pain.  From a bladder standpoint, he catheterizes himself every 5 hours.  Usually the volumes are 400-500 cc.  At night they are more, sometimes up to 900 at night.  He's having incontinent episodes most recently, especially at night.   From a bowel standpoint he's having a BM every day. He's using fiber and a daily suppository with good results.  He ran out of suppositories this AM    Pain Inventory Average Pain 5 Pain Right Now 3 My pain is constant and dull  In the last 24 hours, has pain interfered with the following? General activity 6 Relation with others 0 Enjoyment of life 8 What TIME of day is your pain at its worst? morning Sleep (in general) Good  Pain is worse with: some activites Pain improves with: medication Relief from Meds: 8  Mobility how many minutes can you walk? paralysis ability to climb steps?  no do you drive?  no use a wheelchair needs help with transfers Do you have any goals in this area?   yes  Function disabled: date disabled 11/16/2011 I need assistance with the following:  dressing, bathing, toileting, meal prep, household duties and shopping  Neuro/Psych numbness trouble walking dizziness  Prior Studies Any changes since last visit?  no  Physicians involved in your care Any changes since last visit?  no     Review of Systems  Constitutional: Positive for unexpected weight change.       Weight loss and high blood sugar  Cardiovascular: Positive for leg swelling.  Genitourinary: Positive for difficulty urinating.       Urinary retention- requires catheterization due to paralysis  Musculoskeletal: Positive for back pain and gait problem.  Skin: Positive for rash.       Rash and irritation on left bicep area most likely from rubbing body brace  Neurological: Positive for dizziness and numbness.  All other systems reviewed and are negative.       Objective:   Physical Exam  Constitutional: He is oriented to person, place, and time. He appears well-developed and well-nourished.  HENT:  Head: Normocephalic and atraumatic.  Eyes: Conjunctivae and EOM are normal. Pupils are equal, round, and reactive to light.  Neck: Normal range of motion.  Cardiovascular: Normal rate.   Pulmonary/Chest: Effort normal.  Abdominal: Soft.  Neurological: He is alert and oriented to person, place, and time. A sensory deficit is  present.  Reflex Scores:      Tricep reflexes are 1+ on the right side and 1+ on the left side.      Bicep reflexes are 1+ on the right side and 1+ on the left side.      Brachioradialis reflexes are 1+ on the right side and 1+ on the left side.      Patellar reflexes are 2+ on the right side and 2+ on the left side.      Achilles reflexes are 2+ on the right side and 2+ on the left side.      T10-11 sensory level. No PP or LT below the level.  No resting tone in legs. No volitional movement in either leg. Good sitting posture. He is wearing his  brace.  Upper extremity neuro exam is within normal limits.  Skin: Skin is warm.  Psychiatric: He has a normal mood and affect. His behavior is normal. Judgment and thought content normal.          Assessment & Plan:  1. Thoracic T11 fracture after fall with ASIA A spinal cord injury.  2. DVT proph with lovenox for approximately 3 more weeks 3. Pain management.  4. Neurogenic bowel and bladder.  5. Diabetes mellitus.  6. Hyperlipidemia  Plan: 1. His pain is fairly well controlled. I added robaxin for cramping.  He has no resting tone.  Continue with oxy IR for breakthrough back pain. TLSO per NS. 2.Continue with IC for bladder. Encouraged pt to cath more frequently in the evening and/or decrease oral fluids in the PM hours.  I don't see any signs of a spastic bladder yet, as he's not having incontinence during the day yet.  -made a referral to [facility] for assesment  -no indication yet for a UA or Culture 3.Continue with QAM bowel proram. Utilize fiber and daily OTC dulcolax suppository 4.Made a referral to outpt PT for furhter mobility and adaptive equipment assessment. Frankly, there may not be much new that they can offer. 5. I spoke with patient, his family, and the case manager at length regarding all of these issues. All questions were encouraged and answered. 6. I will see the patient back in about 2 months  40 minutes were spent with patient/case manager etc

## 2012-01-06 NOTE — Patient Instructions (Signed)
Try to decrease your fluid intake in the late afternoon and evening hours to decrease your urinary incontinence

## 2012-01-07 ENCOUNTER — Encounter: Payer: Self-pay | Admitting: Physical Medicine & Rehabilitation

## 2012-01-14 ENCOUNTER — Encounter: Payer: Worker's Compensation | Admitting: Internal Medicine

## 2012-01-18 ENCOUNTER — Ambulatory Visit: Payer: Worker's Compensation | Admitting: Physical Therapy

## 2012-01-19 ENCOUNTER — Ambulatory Visit: Payer: Worker's Compensation | Attending: Physical Medicine & Rehabilitation | Admitting: Physical Therapy

## 2012-01-19 DIAGNOSIS — R5381 Other malaise: Secondary | ICD-10-CM | POA: Insufficient documentation

## 2012-01-19 DIAGNOSIS — M6281 Muscle weakness (generalized): Secondary | ICD-10-CM | POA: Insufficient documentation

## 2012-01-19 DIAGNOSIS — IMO0001 Reserved for inherently not codable concepts without codable children: Secondary | ICD-10-CM | POA: Insufficient documentation

## 2012-01-22 ENCOUNTER — Telehealth: Payer: Self-pay | Admitting: Physical Medicine & Rehabilitation

## 2012-01-22 ENCOUNTER — Ambulatory Visit: Payer: Worker's Compensation | Admitting: Physical Therapy

## 2012-01-22 NOTE — Telephone Encounter (Signed)
Daughter stated patient was complaining of L hand pain.  Complained of this while in hospital, but was told it was muscular in nature.  Still having this pain.  Unable to Dr Riley Kill until 03/08/12.  Will be seeing Dr Lovell Sheehan on 01/30/12, can he address?  Please call Pam to let her know how to proceed.

## 2012-01-22 NOTE — Telephone Encounter (Signed)
LM with Pam advising her that if Dr. Lovell Sheehan will address his hand that's fine but if not he will have to wait until his appointment with Dr. Riley Kill.

## 2012-01-27 ENCOUNTER — Ambulatory Visit: Payer: Worker's Compensation | Admitting: Physical Therapy

## 2012-01-29 ENCOUNTER — Ambulatory Visit: Payer: Worker's Compensation | Admitting: Physical Therapy

## 2012-02-02 ENCOUNTER — Ambulatory Visit: Payer: Worker's Compensation | Admitting: Physical Therapy

## 2012-02-03 ENCOUNTER — Ambulatory Visit: Payer: Worker's Compensation | Admitting: Physical Therapy

## 2012-02-04 ENCOUNTER — Ambulatory Visit: Payer: Worker's Compensation | Admitting: Physical Therapy

## 2012-02-05 ENCOUNTER — Ambulatory Visit: Payer: Worker's Compensation | Admitting: Physical Therapy

## 2012-02-09 ENCOUNTER — Ambulatory Visit: Payer: Worker's Compensation | Admitting: Physical Therapy

## 2012-02-10 ENCOUNTER — Encounter: Payer: Self-pay | Admitting: Physical Medicine & Rehabilitation

## 2012-02-11 ENCOUNTER — Ambulatory Visit: Payer: Worker's Compensation | Attending: Physical Medicine & Rehabilitation | Admitting: Physical Therapy

## 2012-02-11 DIAGNOSIS — IMO0001 Reserved for inherently not codable concepts without codable children: Secondary | ICD-10-CM | POA: Insufficient documentation

## 2012-02-11 DIAGNOSIS — R5381 Other malaise: Secondary | ICD-10-CM | POA: Insufficient documentation

## 2012-02-11 DIAGNOSIS — M6281 Muscle weakness (generalized): Secondary | ICD-10-CM | POA: Insufficient documentation

## 2012-02-15 ENCOUNTER — Other Ambulatory Visit: Payer: Self-pay | Admitting: *Deleted

## 2012-02-15 ENCOUNTER — Ambulatory Visit: Payer: Worker's Compensation | Admitting: Physical Therapy

## 2012-02-15 MED ORDER — PREGABALIN 100 MG PO CAPS: 100.0000 mg | ORAL_CAPSULE | Freq: Two times a day (BID) | ORAL | Status: DC

## 2012-02-17 ENCOUNTER — Ambulatory Visit: Payer: Worker's Compensation | Admitting: Physical Therapy

## 2012-02-19 ENCOUNTER — Ambulatory Visit: Payer: Worker's Compensation | Admitting: Occupational Therapy

## 2012-02-19 ENCOUNTER — Ambulatory Visit: Payer: Worker's Compensation | Admitting: Physical Therapy

## 2012-02-22 ENCOUNTER — Ambulatory Visit: Payer: Worker's Compensation | Admitting: Occupational Therapy

## 2012-02-22 ENCOUNTER — Ambulatory Visit: Payer: Worker's Compensation | Admitting: Physical Therapy

## 2012-02-24 ENCOUNTER — Ambulatory Visit: Payer: Worker's Compensation | Admitting: Physical Therapy

## 2012-03-02 ENCOUNTER — Ambulatory Visit: Payer: Worker's Compensation | Admitting: Occupational Therapy

## 2012-03-02 ENCOUNTER — Ambulatory Visit: Payer: Worker's Compensation | Admitting: Physical Therapy

## 2012-03-03 ENCOUNTER — Ambulatory Visit: Payer: Worker's Compensation | Admitting: Occupational Therapy

## 2012-03-08 ENCOUNTER — Ambulatory Visit: Payer: Worker's Compensation | Admitting: Physical Therapy

## 2012-03-08 ENCOUNTER — Ambulatory Visit: Payer: Worker's Compensation | Admitting: Occupational Therapy

## 2012-03-08 ENCOUNTER — Ambulatory Visit: Payer: Worker's Compensation | Admitting: Physical Medicine & Rehabilitation

## 2012-03-10 ENCOUNTER — Ambulatory Visit: Payer: Worker's Compensation | Admitting: Physical Therapy

## 2012-03-10 ENCOUNTER — Ambulatory Visit: Payer: Worker's Compensation | Admitting: Occupational Therapy

## 2012-03-14 ENCOUNTER — Ambulatory Visit: Payer: Worker's Compensation | Attending: Physical Medicine & Rehabilitation | Admitting: Occupational Therapy

## 2012-03-14 ENCOUNTER — Ambulatory Visit: Payer: Worker's Compensation | Admitting: Physical Therapy

## 2012-03-14 DIAGNOSIS — M6281 Muscle weakness (generalized): Secondary | ICD-10-CM | POA: Insufficient documentation

## 2012-03-14 DIAGNOSIS — IMO0001 Reserved for inherently not codable concepts without codable children: Secondary | ICD-10-CM | POA: Insufficient documentation

## 2012-03-14 DIAGNOSIS — R5381 Other malaise: Secondary | ICD-10-CM | POA: Insufficient documentation

## 2012-03-15 ENCOUNTER — Ambulatory Visit: Payer: Worker's Compensation | Admitting: Physical Medicine & Rehabilitation

## 2012-03-17 ENCOUNTER — Ambulatory Visit: Payer: Worker's Compensation | Admitting: Physical Therapy

## 2012-03-17 ENCOUNTER — Ambulatory Visit: Payer: Worker's Compensation | Admitting: Occupational Therapy

## 2012-03-25 ENCOUNTER — Ambulatory Visit: Payer: Worker's Compensation | Admitting: Physical Medicine & Rehabilitation

## 2012-03-29 ENCOUNTER — Encounter
Payer: Worker's Compensation | Attending: Physical Medicine & Rehabilitation | Admitting: Physical Medicine & Rehabilitation

## 2012-03-29 ENCOUNTER — Encounter: Payer: Self-pay | Admitting: Physical Medicine & Rehabilitation

## 2012-03-29 VITALS — BP 116/68 | HR 73 | Resp 16 | Ht 64.0 in | Wt 180.0 lb

## 2012-03-29 DIAGNOSIS — M7522 Bicipital tendinitis, left shoulder: Secondary | ICD-10-CM | POA: Insufficient documentation

## 2012-03-29 DIAGNOSIS — K592 Neurogenic bowel, not elsewhere classified: Secondary | ICD-10-CM

## 2012-03-29 DIAGNOSIS — M7520 Bicipital tendinitis, unspecified shoulder: Secondary | ICD-10-CM | POA: Insufficient documentation

## 2012-03-29 DIAGNOSIS — N319 Neuromuscular dysfunction of bladder, unspecified: Secondary | ICD-10-CM

## 2012-03-29 DIAGNOSIS — X58XXXS Exposure to other specified factors, sequela: Secondary | ICD-10-CM | POA: Insufficient documentation

## 2012-03-29 DIAGNOSIS — IMO0002 Reserved for concepts with insufficient information to code with codable children: Secondary | ICD-10-CM

## 2012-03-29 MED ORDER — OXYBUTYNIN CHLORIDE ER 5 MG PO TB24: 5.0000 mg | ORAL_TABLET | Freq: Every day | ORAL | Status: DC

## 2012-03-29 MED ORDER — MELOXICAM 15 MG PO TABS: 15.0000 mg | ORAL_TABLET | Freq: Every day | ORAL | Status: DC

## 2012-03-29 NOTE — Progress Notes (Signed)
Subjective:    Patient ID: John Meza, male    DOB: Feb 28, 1961, 51 y.o.   MRN: [id number]  HPI  John Meza is back regarding his SCI. His biggest complaint is his left shoulder which is preventing him from participating in therapies. The pain bothers him with any overhead activities or when he has to bring his arm out in front. He has a hard time transferring as well.   He's been seen by urology. He's had urodynamic testing and goes back for follow up next week. He's having bladder spasms and hypogastric pain. He's IC'ing 8 x per day.  Stools have been loose at times.   His leg straps are broken   Pain Inventory Average Pain 1 Pain Right Now 7 My pain is intermittent and stabbing  In the last 24 hours, has pain interfered with the following? General activity 7 Relation with others 7 Enjoyment of life 7 What TIME of day is your pain at its worst? night Sleep (in general) Poor  Pain is worse with: some activites Pain improves with: heat/ice Relief from Meds: 0  Mobility use a wheelchair needs help with transfers  Function not employed: date last employed injury I need assistance with the following:  dressing, bathing, toileting, meal prep, household duties and shopping  Neuro/Psych bladder control problems bowel control problems numbness trouble walking  Prior Studies Any changes since last visit?  no  Physicians involved in your care Any changes since last visit?  no   Family History  Problem Relation Age of Onset  . Diabetes Father    History   Social History  . Marital Status: Married    Spouse Name: N/A    Number of Children: N/A  . Years of Education: N/A   Social History Main Topics  . Smoking status: Never Smoker   . Smokeless tobacco: Never Used  . Alcohol Use: 0.6 oz/week    1 Cans of beer per week  . Drug Use: No  . Sexually Active: Yes   Other Topics Concern  . None   Social History Narrative  . None   Past Surgical History    Procedure Date  . Spine surgery    Past Medical History  Diagnosis Date  . Diabetes mellitus   . Hypertension   . Spine fracture 11/16/2011    T 11- T9-L1   BP 116/68  Pulse 73  Resp 16  Ht 5\' 4"  (1.626 m)  Wt 180 lb (81.647 kg)  BMI 30.90 kg/m2  SpO2 100%    Review of Systems  Musculoskeletal: Positive for gait problem.       Paralysis  Neurological: Positive for numbness.  All other systems reviewed and are negative.          Physical Exam  Objective:   Physical Exam  Constitutional: He is oriented to person, place, and time. He appears well-developed and well-nourished.  HENT:  Head: Normocephalic and atraumatic.  Eyes: Conjunctivae and EOM are normal. Pupils are equal, round, and reactive to light.  Neck: Normal range of motion.  Cardiovascular: Normal rate.  Pulmonary/Chest: Effort normal.  Abdominal: Soft.  Neurological: He is alert and oriented to person, place, and time. A sensory deficit is present.  Reflex Scores:  Tricep reflexes are 1+ on the right side and 1+ on the left side.  Bicep reflexes are 1+ on the right side and 1+ on the left side.  Brachioradialis reflexes are 1+ on the right side and 1+ on the left  side.  Patellar reflexes are 2+ on the right side and 2+ on the left side.  Achilles reflexes are 2+ on the right side and 2+ on the left side. T10-11 sensory level. No PP or LT below the level. No resting tone in legs. No volitional movement in either leg. Good sitting posture. He is wearing his brace.  Upper extremity neuro exam is within normal limits.  Musc: left biceps tendon short head is painful to palpation, speeds test is positive. Flexion is painful.   Skin: Skin is warm.  Psychiatric: He has a normal mood and affect. His behavior is normal. Judgment and thought content normal.  Assessment & Plan:   1. Thoracic T11 fracture after fall with ASIA A spinal cord injury.  2. DVT proph with lovenox for approximately 3 more weeks  3.  Pain management.  4. Neurogenic bowel and bladder with bladder spasms 5. Diabetes mellitus.  6. Hyperlipidemia  7. Left short head biceps tendonitis   Plan:  1. After informed consent i injected the short head biceps tendon with 40mg  kenalog and 3cc 1%lidocaine. Pt tolerated without issues. Will discuss return to OT to work on shoulder strength, ROM, etc.   -i also added mobic 15mg  qam  -recommended ice tid 2.Continue with IC for bladder. Encouraged pt to cath more frequently in the evening and/or decrease oral fluids in the PM hours. I don't see any signs of a spastic bladder yet, as he's not having incontinence during the day yet.  -He sees uro on Monday to discuss urodynamic testing -will try ditropan xl 5mg  qday for bladder spasms. He will discuss further with urology depending upon his response  3.Continue with QAM bowel proram. drop fiber and laxative, and continue daily OTC dulcolax suppository  4.reaffimred that lovenox could be stopped  5. I spoke with patient, his family, and the case manager at length regarding all of these issues. All questions were encouraged and answered.  6. I will see the patient back in about 2 months  30 minutes were spent with patient/case manager etc. Incidentally i wrote for replacement leg straps

## 2012-03-29 NOTE — Patient Instructions (Addendum)
Ice three x per day to left shoulder.   Therapy will work on exercises with you as well.  Please back off your fiber pill and if you are still having loose stool stop the senna also.

## 2012-04-06 ENCOUNTER — Ambulatory Visit: Payer: Worker's Compensation | Admitting: Occupational Therapy

## 2012-04-08 ENCOUNTER — Emergency Department (HOSPITAL_COMMUNITY)
Admission: EM | Admit: 2012-04-08 | Discharge: 2012-04-08 | Disposition: A | Discharge status: Home / Self Care | Payer: Worker's Compensation | Attending: Emergency Medicine | Admitting: Emergency Medicine

## 2012-04-08 ENCOUNTER — Ambulatory Visit: Payer: Worker's Compensation | Admitting: Occupational Therapy

## 2012-04-08 ENCOUNTER — Encounter (HOSPITAL_COMMUNITY): Payer: Self-pay | Admitting: Emergency Medicine

## 2012-04-08 DIAGNOSIS — E1169 Type 2 diabetes mellitus with other specified complication: Secondary | ICD-10-CM | POA: Insufficient documentation

## 2012-04-08 DIAGNOSIS — Z79899 Other long term (current) drug therapy: Secondary | ICD-10-CM | POA: Insufficient documentation

## 2012-04-08 DIAGNOSIS — R319 Hematuria, unspecified: Secondary | ICD-10-CM

## 2012-04-08 DIAGNOSIS — R739 Hyperglycemia, unspecified: Secondary | ICD-10-CM

## 2012-04-08 DIAGNOSIS — G8220 Paraplegia, unspecified: Secondary | ICD-10-CM | POA: Insufficient documentation

## 2012-04-08 DIAGNOSIS — I10 Essential (primary) hypertension: Secondary | ICD-10-CM | POA: Insufficient documentation

## 2012-04-08 HISTORY — DX: Reserved for concepts with insufficient information to code with codable children: IMO0002

## 2012-04-08 LAB — URINALYSIS, ROUTINE W REFLEX MICROSCOPIC
Bilirubin Urine: NEGATIVE
Leukocytes, UA: NEGATIVE
Nitrite: NEGATIVE
Specific Gravity, Urine: 1.037 — ABNORMAL HIGH (ref 1.005–1.030)
Urobilinogen, UA: 0.2 mg/dL (ref 0.0–1.0)

## 2012-04-08 LAB — CBC WITH DIFFERENTIAL/PLATELET
Eosinophils Absolute: 0.1 10*3/uL (ref 0.0–0.7)
Lymphs Abs: 2.3 10*3/uL (ref 0.7–4.0)
MCH: 28.9 pg (ref 26.0–34.0)
Neutrophils Relative %: 58 % (ref 43–77)
Platelets: 164 10*3/uL (ref 150–400)
RBC: 5.12 MIL/uL (ref 4.22–5.81)
WBC: 6.8 10*3/uL (ref 4.0–10.5)

## 2012-04-08 LAB — POCT I-STAT, CHEM 8
Calcium, Ion: 1.27 mmol/L (ref 1.12–1.32)
Glucose, Bld: 291 mg/dL — ABNORMAL HIGH (ref 70–99)
HCT: 45 % (ref 39.0–52.0)
Hemoglobin: 15.3 g/dL (ref 13.0–17.0)
Potassium: 4.6 mEq/L (ref 3.5–5.1)

## 2012-04-08 LAB — URINE MICROSCOPIC-ADD ON

## 2012-04-08 NOTE — ED Provider Notes (Signed)
Medical screening examination/treatment/procedure(s) were performed by non-physician practitioner and as supervising physician I was immediately available for consultation/collaboration.   Audric Venn R Jamesyn Moorefield, MD 04/08/12 2346 

## 2012-04-08 NOTE — ED Provider Notes (Signed)
History     CSN: [id number]  Arrival date & time 04/08/12  1929   First MD Initiated Contact with Patient 04/08/12 2148      Chief Complaint  Patient presents with  . Hematuria    (Consider location/radiation/quality/duration/timing/severity/associated sxs/prior treatment) HPI Comments: Patient who is a paraplegic, was noted today on catheterization of his urine to have hematuria.  His second catheterization later in the day.  Again, showed hematuria with some clots.  He was also complaining of left flank pain.  No nausea, vomiting, fevers, chills, headache, dizziness.   Patient is a 50 y.o. male presenting with hematuria. The history is provided by the patient and a caregiver.  Hematuria This is a new problem. The current episode started today. He describes the hematuria as gross hematuria. The hematuria occurs during the initial portion of his urinary stream. He reports no clotting in his urine stream. He describes his urine color as clear. Associated symptoms include flank pain. Pertinent negatives include no fever, nausea or vomiting.    Past Medical History  Diagnosis Date  . Diabetes mellitus   . Hypertension   . Spine fracture 11/16/2011    T 11- T9-L1  . SCI (spinal cord injury)     Past Surgical History  Procedure Date  . Spine surgery     Family History  Problem Relation Age of Onset  . Diabetes Father     History  Substance Use Topics  . Smoking status: Never Smoker   . Smokeless tobacco: Never Used  . Alcohol Use: 0.6 oz/week    1 Cans of beer per week      Review of Systems  Constitutional: Negative for fever.  Gastrointestinal: Negative for nausea and vomiting.  Genitourinary: Positive for hematuria and flank pain.  Neurological: Negative for dizziness and weakness.    Allergies  Review of patient's allergies indicates no known allergies.  Home Medications   Current Outpatient Rx  Name Route Sig Dispense Refill  . BISACODYL 10 MG RE SUPP  Rectal Place 10 mg rectally as needed.    [name] LISINOPRIL 5 MG PO TABS Oral Take 5 mg by mouth daily.    . MELOXICAM 15 MG PO TABS Oral Take 1 tablet (15 mg total) by mouth daily. 30 tablet 3  . METFORMIN HCL 1000 MG PO TABS Oral Take 1,000 mg by mouth 2 (two) times daily with a meal.    . MIRABEGRON ER 50 MG PO TB24 Oral Take 50 mg by mouth daily.    [name] NIACIN ER (ANTIHYPERLIPIDEMIC) 1000 MG PO TBCR Oral Take 1,000 mg by mouth at bedtime.    [name] PREGABALIN 100 MG PO CAPS Oral Take 1 capsule (100 mg total) by mouth 2 (two) times daily. 60 capsule 1  . TERBINAFINE HCL 250 MG PO TABS Oral Take 250 mg by mouth daily.      BP 132/92  Pulse 73  Temp 97 F (36.1 C) (Oral)  Resp 18  SpO2 100%  Physical Exam  Constitutional: He is oriented to person, place, and time. He appears well-developed.  HENT:  Head: Normocephalic.  Eyes: Pupils are equal, round, and reactive to light.  Neck: Normal range of motion.  Cardiovascular: Normal rate.   Pulmonary/Chest: Effort normal.  Musculoskeletal:       Paraplegia with lower extremities in braces  Neurological: He is alert and oriented to person, place, and time.  Skin: Skin is warm and dry.    ED Course  Procedures (including critical  care time)  Labs Reviewed  URINALYSIS, ROUTINE W REFLEX MICROSCOPIC - Abnormal; Notable for the following:    Specific Gravity, Urine 1.037 (*)     Glucose, UA >1000 (*)     Hgb urine dipstick LARGE (*)     All other components within normal limits  URINE MICROSCOPIC-ADD ON  CBC WITH DIFFERENTIAL  GLUCOSE, CAPILLARY   No results found.   1. Hematuria   2. Hyperglycemia       MDM   Has not taken his p.m. medications.  Tonight and he, has eaten, which may explain his slightly elevated blood sugar.  He is not having any polydipsia excessive thirst.  Family will watch his blood sugars over the weekend.  I also reviewed with the proper catheterization 6 techniques and lubrication.  They're comfortable taking  him home.  At this time, and will monitor for any recurrent episodes of gross hematuria I feel that this was most probably atraumatic catheterization due to the lack of infection at this time        Arman Filter, NP 04/08/12 2209  Arman Filter, NP 04/08/12 2209

## 2012-04-08 NOTE — Discharge Instructions (Signed)
Hiperglucemia (Hyperglycemia) La hiperglucemia ocurre cuando la glucosa (azcar) en su sangre est demasiado elevada. Puede suceder por varias razones, pero a menudo ocurre en personas que no saben que tienen diabetes o no la controlan adecuadamente.  CAUSAS Tanto si tiene diabetes como si no, existen otras causas para la hiperglucemia. La hiperglucemia puede producirse cuando tiene diabetes, pero tambin puede presentarse en otras situaciones de las que podra no ser consciente, como por ejemplo: Diabetes  Si tiene diabetes y tiene problemas para controlar su glucosa en sangre, la hiperglucemia podra producirse debido a las siguientes razones:   No seguir Press photographer.   No tomar los medicamentos para la diabetes o tomarlos de forma inadecuada.   Realizar menos ejercicio del que normalmente hace.   Estar enfermo.  Prediabetes  Esto no puede ignorarse. Antes de que la persona presente diabetes de tipo 2, casi siempre hay "prediabetes". Esto ocurre cuando su glucosa en sangre es mayor que lo normal, pero no lo suficiente como para diagnosticar diabetes. La investigacin ha demostrado que algunos daos al cuerpo de Air cabin crew, en especial los del corazn y el sistema circulatorio, podran haber ocurrido durante el periodo de prediabetes. Si controla la glucosa en sangre cuando tiene prediabetes, podr retardar o evitar que se desarrrolle la diabetes tipo 2.  El estrs  Si tiene diabetes, deber hacer una dieta, tomar medicamentos orales o insulina para [place]. Sin embargo, Clinical research associate que la glucosa en sangre es mayor que lo normal en el hospital tenga o no diabetes. Cientficamente se lo denomina "hiperglucemia por estrs". El estrs puede elevar su glucosa en sangre. Esto ocurre porque el organismo genera hormonas en los momentos de estrs. Si el estrs ha [employer] causa del alto nivel de glucosa en [place], [place] mdico podr Education officer, environmental un seguimiento de [place] regular. Feliberto Harts, podr asegurarse de que la hiperglucemia no empeora o progresa hacia diabetes.  Esteroides  Los esteroides son medicamentos que actan en la infeccin que ataca al sistema inmunolgico para bloquear la inflamacin o la infeccin. Un efecto secundario puede ser el aumento de glucosa en [place]. Muchas personas pueden producir la suficiente insulina extra para este aumento, pero aquellos que no pueden, los esteroides pueden [employer] niveles sean an [place]. No es inusual que los tratamientos con esteorides "destapen" una diabetes que se est desarrollando. No siempre es posible determinar si la hiperglucemia desaparecer una vez que se detenga el consumo de esteroides. A veces se realiza un anlisis de sangre especial denominado A1c para determinar si la glucosa en sangre se ha elevado antes de comenzar con el consumo de esteroides.  SNTOMAS  Sed.   Necesidad frecuente de Geographical information systems officer.   [employer].   Visin borrosa.   Cansancio o fatiga.   Debilidad.   Somnolencia.   Hormigueo en el pie o pierna.  DIAGNSTICO El diagnstico se realiza mediante el control de la glucosa en sangre de una o varias de las siguientes maneras:  Anlisis A1c. Es una sustancia qumica que se encuentra en la [employer].   Control de glucosa en sangre con tiras de prueba.   Resultados de laboratorio.  TRATAMIENTO Primero, es importante conocer la causa de la hiperglucemia antes de tratarla. El tratamiento puede ser el siguiente, [place] pueden ser otros:  Educacin   Cambios o ajustes en los medicamentos.   Cambios o ajustes en el plan de alimentacin.   Tratamiento por enfermedades, infecciones, etc.   Control de glucosa en sangre ms frecuente.  Cambios en el plan de ejercicios.   Disminucin o interrupcin del consumo de esteroides.   Cambios en el estilo de vida.  INSTRUCCIONES PARA EL CUIDADO DOMICILIARIO  Contrlese la glucosa en sangre, como se lo indicaron.   Haga ejercicios  regularmente. El profesional que lo asiste le dar instrucciones relacionadas con el ejercicio fsico. La prediabetes que es consecuencia de situaciones de estrs, puede mejorar con la actividad fsica.   Consuma alimentos saludables y balanceados. Coma a menudo y de [place] regular, en momentos fijos. El profesional o el nutricionista le dar una dieta especial para controlar su ingestin de azcar.   Mantener su peso ideal es importante. Si lo necesita, perder un poco de peso, como 5  7 Kg. puede ser beneficioso para [employer] niveles de Production assistant, radio.  SOLICITE ATENCIN MDICA SI:  Tiene preguntas relacionadas con los medicamentos, la actividad o la dieta.   Contina teniendo sntomas (como mucha sed, deseos intensos de Geographical information systems officer o aumento de peso)  SOLICITE ATENCIN MDICA DE INMEDIATO SI:  Vomita o tiene diarrea.   Su respiracin huele frutal.   La frecuencia respiratoria es ms rpida o ms lenta.   Est somnoliento o incoherente.   Siente adormecimiento, hormigueos o Tax adviser o en las manos.   Siente dolor en el pecho.   Sus sntomas empeoran aunque haya seguido las indicaciones de su mdico.   Tiene otras preguntas o preocupaciones.  Document Released: 09/28/2005 Document Revised: 09/17/2011 As discussed, use plenty of lubricant prior to catheterization, as well as try having Mr. Morales.  Lie down rather than catheterizing him in his wheelchair Please monitor his sugars carefully over the weekend [employer] Patient Information [address], [place].

## 2012-04-08 NOTE — ED Notes (Signed)
Pt d/c home in NAD. Pt voiced understanding of d/c instructions and follow up care.  

## 2012-04-08 NOTE — ED Notes (Signed)
During 2 catheterizations today, pt had more blood than urine. Pt family states that there were large clots in first sample and 2nd sample was mostly blood.

## 2012-04-08 NOTE — ED Notes (Signed)
PT. REPORTS HEMATURIA TODAY , STATES BLOODY URINE WHEN HE WAS CATHETERIZED TODAY , HISTORY OF SCI - PARAPHLEGIA .

## 2012-04-12 ENCOUNTER — Encounter: Payer: Worker's Compensation | Admitting: Occupational Therapy

## 2012-04-15 ENCOUNTER — Ambulatory Visit: Payer: Worker's Compensation | Attending: Physical Medicine & Rehabilitation | Admitting: Occupational Therapy

## 2012-04-15 DIAGNOSIS — M6281 Muscle weakness (generalized): Secondary | ICD-10-CM | POA: Insufficient documentation

## 2012-04-15 DIAGNOSIS — R5381 Other malaise: Secondary | ICD-10-CM | POA: Insufficient documentation

## 2012-04-15 DIAGNOSIS — IMO0001 Reserved for inherently not codable concepts without codable children: Secondary | ICD-10-CM | POA: Insufficient documentation

## 2012-04-18 ENCOUNTER — Ambulatory Visit: Payer: Worker's Compensation | Attending: Physical Medicine & Rehabilitation | Admitting: *Deleted

## 2012-04-18 ENCOUNTER — Telehealth: Payer: Self-pay | Admitting: Physical Medicine & Rehabilitation

## 2012-04-18 DIAGNOSIS — M25519 Pain in unspecified shoulder: Secondary | ICD-10-CM | POA: Insufficient documentation

## 2012-04-18 DIAGNOSIS — M6281 Muscle weakness (generalized): Secondary | ICD-10-CM | POA: Insufficient documentation

## 2012-04-18 DIAGNOSIS — IMO0001 Reserved for inherently not codable concepts without codable children: Secondary | ICD-10-CM | POA: Insufficient documentation

## 2012-04-18 NOTE — Telephone Encounter (Signed)
Erin w/ [employer]ace [employer](?) [phone] -received order from [facility] for 2 leg straps.  Needs more clarification.

## 2012-04-20 ENCOUNTER — Telehealth: Payer: Self-pay | Admitting: Physical Medicine & Rehabilitation

## 2012-04-20 NOTE — Telephone Encounter (Signed)
Not sure what the confusion is. He has a current set of straps which he uses to move his legs. He is paraparetic. We requested replacement straps (2 sets).  His current straps are wearing out.

## 2012-04-20 NOTE — Telephone Encounter (Signed)
Patent attorney 2nd call for clarification on leg straps.

## 2012-04-21 ENCOUNTER — Encounter: Payer: Self-pay | Admitting: Physical Medicine and Rehabilitation

## 2012-04-21 ENCOUNTER — Encounter
Payer: Worker's Compensation | Attending: Physical Medicine and Rehabilitation | Admitting: Physical Medicine and Rehabilitation

## 2012-04-21 ENCOUNTER — Encounter: Payer: Worker's Compensation | Admitting: Occupational Therapy

## 2012-04-21 VITALS — BP 112/70 | HR 78 | Resp 16 | Ht 64.0 in | Wt 180.0 lb

## 2012-04-21 DIAGNOSIS — M545 Low back pain, unspecified: Secondary | ICD-10-CM | POA: Insufficient documentation

## 2012-04-21 DIAGNOSIS — IMO0002 Reserved for concepts with insufficient information to code with codable children: Secondary | ICD-10-CM | POA: Insufficient documentation

## 2012-04-21 DIAGNOSIS — K592 Neurogenic bowel, not elsewhere classified: Secondary | ICD-10-CM | POA: Insufficient documentation

## 2012-04-21 DIAGNOSIS — X58XXXA Exposure to other specified factors, initial encounter: Secondary | ICD-10-CM | POA: Insufficient documentation

## 2012-04-21 DIAGNOSIS — E119 Type 2 diabetes mellitus without complications: Secondary | ICD-10-CM | POA: Insufficient documentation

## 2012-04-21 DIAGNOSIS — N319 Neuromuscular dysfunction of bladder, unspecified: Secondary | ICD-10-CM | POA: Insufficient documentation

## 2012-04-21 DIAGNOSIS — G8220 Paraplegia, unspecified: Secondary | ICD-10-CM

## 2012-04-21 DIAGNOSIS — E785 Hyperlipidemia, unspecified: Secondary | ICD-10-CM | POA: Insufficient documentation

## 2012-04-21 DIAGNOSIS — Z86718 Personal history of other venous thrombosis and embolism: Secondary | ICD-10-CM | POA: Insufficient documentation

## 2012-04-21 DIAGNOSIS — M6580 Other synovitis and tenosynovitis, unspecified site: Secondary | ICD-10-CM | POA: Insufficient documentation

## 2012-04-21 MED ORDER — DICLOFENAC SODIUM 1 % TD GEL: 1.0000 "application " | Freq: Four times a day (QID) | TRANSDERMAL | Status: DC

## 2012-04-21 NOTE — Progress Notes (Signed)
Subjective:    Patient ID: John Meza, male    DOB: 10/29/1960, 51 y.o.   MRN: [id number]  HPI The patient complains about low back pain and a sore on his right ischial tuberosity. He has a history of T 11 fracture, with spinal cord injury. He is paraplegic, he got injured at his workplace in February 2013 when he fell from a ladder. The patient states that otherwise he feels okay, he exercises with a physical therapist and with his wife at home regularly. Pain Inventory Average Pain 6 Pain Right Now 1 My pain is intermittent and stabbing  In the last 24 hours, has pain interfered with the following? General activity 4 Relation with others 0 Enjoyment of life 2 What TIME of day is your pain at its worst? night Sleep (in general) Fair  Pain is worse with: sitting Pain improves with: other Relief from Meds: 0  Mobility use a wheelchair needs help with transfers  Function disabled: date disabled work injury I need assistance with the following:  dressing, bathing, toileting, meal prep, household duties and shopping  Neuro/Psych bladder control problems numbness trouble walking  Prior Studies Any changes since last visit?  no  Physicians involved in your care Any changes since last visit?  no   Family History  Problem Relation Age of Onset  . Diabetes Father    History   Social History  . Marital Status: Married    Spouse Name: N/A    Number of Children: N/A  . Years of Education: N/A   Social History Main Topics  . Smoking status: Never Smoker   . Smokeless tobacco: Never Used  . Alcohol Use: 0.6 oz/week    1 Cans of beer per week  . Drug Use: No  . Sexually Active: None   Other Topics Concern  . None   Social History Narrative  . None   Past Surgical History  Procedure Date  . Spine surgery    Past Medical History  Diagnosis Date  . Diabetes mellitus   . Hypertension   . Spine fracture 11/16/2011    T 11- T9-L1  . SCI (spinal cord  injury)    BP 112/70  Pulse 78  Resp 16  Ht 5\' 4"  (1.626 m)  Wt 180 lb (81.647 kg)  BMI 30.90 kg/m2  SpO2 99%     Review of Systems  Constitutional: Positive for unexpected weight change.  Gastrointestinal: Positive for abdominal pain.  Genitourinary: Positive for difficulty urinating.       Bladder control problems  Musculoskeletal:       Paralysis  Neurological: Positive for numbness.  All other systems reviewed and are negative.       Objective:   Physical Exam Constitutional: He is oriented to person, place, and time. He appears well-developed and well-nourished.  HENT:  Head: Normocephalic and atraumatic.  Eyes: Conjunctivae and EOM are normal. Pupils are equal, round, and reactive to light.  Neck: Normal range of motion.  Cardiovascular: Normal rate.  Pulmonary/Chest: Effort normal.  Abdominal: Soft.  Neurological: He is alert and oriented to person, place, and time. A sensory deficit is present.  Reflex Scores:  Tricep reflexes are 1+ on the right side and 1+ on the left side.  Bicep reflexes are 1+ on the right side and 1+ on the left side.  Brachioradialis reflexes are 1+ on the right side and 1+ on the left side.  Patellar reflexes are 2+ on the right side and 2+ on  the left side.  Achilles reflexes are 2+ on the right side and 2+ on the left side. T10-11 sensory level. No PP or LT below the level. No resting tone in legs. No volitional movement in either leg. Good sitting posture. He is wearing his brace.  Upper extremity neuro exam is within normal limits.  Musc: ROM of ankles bilateral -5-0 degrees. Skin: Skin is warm.  Psychiatric: He has a normal mood and affect. His behavior is normal. Judgment and thought content normal.         Assessment & Plan:  1. Thoracic T11 fracture after fall with ASIA A spinal cord injury.  2. DVT proph with lovenox for approximately 3 more weeks  3. Pain management.  4. Neurogenic bowel and bladder with bladder  spasms  5. Diabetes mellitus.  6. Hyperlipidemia  7. Left short head biceps tendonitis , almost resolved after injection at last visit. 8. Low back pain after prolonged sitting, no radiating Sx Plan:  1. Apply ice to your biceps after exercising and showed patient stretching exercises for his biceps 2.Continue with IC for bladder. Encouraged pt to cath more frequently in the evening and/or decrease oral fluids in the PM hours, he's not having incontinence during the day yet.  3.Continue with QAM bowel proram. drop fiber and laxative, and continue daily OTC dulcolax suppository  4. I spoke with patient, his family, and the case manager at length regarding all of these issues. All questions were encouraged and answered. 5. Advised patient to stretch his gastrocnemius and also his achilles tendon, to keep his ankle ROM functional.I demonstrated the proper technique to his wife. I also showed the patient some stretching exercises for his iliopsoas. 6. Referred the patient to wound care for his sore on his right ischial tuberosity. 7. Plan was discussed with his case manager Jola Babinski in detail, she agreed to the plan. 8. Showed patient exercises to relief his low back pain,  Patient will followup with his neurosurgeon in 10 days.  Dr. Riley Kill will see the patient back in about 1 month

## 2012-04-21 NOTE — Patient Instructions (Signed)
Continue with PT, continue with applying ice to your shoulder, also apply Voltaren gel 4 times a day. Patient's wife should continue to move the patient's legs to keep the full range of Motion.

## 2012-04-21 NOTE — Telephone Encounter (Signed)
Left message with Erin's VM explaining what Dr Riley Kill has said about the straps. She can call us back if she needs something further.

## 2012-04-25 ENCOUNTER — Encounter: Payer: Worker's Compensation | Admitting: Physical Medicine and Rehabilitation

## 2012-04-27 ENCOUNTER — Ambulatory Visit: Payer: Worker's Compensation | Admitting: Occupational Therapy

## 2012-04-28 ENCOUNTER — Encounter: Payer: Self-pay | Admitting: Occupational Therapy

## 2012-04-28 ENCOUNTER — Encounter (HOSPITAL_BASED_OUTPATIENT_CLINIC_OR_DEPARTMENT_OTHER): Payer: Worker's Compensation | Attending: Internal Medicine

## 2012-04-28 DIAGNOSIS — G8190 Hemiplegia, unspecified affecting unspecified side: Secondary | ICD-10-CM | POA: Insufficient documentation

## 2012-04-28 DIAGNOSIS — IMO0002 Reserved for concepts with insufficient information to code with codable children: Secondary | ICD-10-CM | POA: Insufficient documentation

## 2012-04-28 DIAGNOSIS — E119 Type 2 diabetes mellitus without complications: Secondary | ICD-10-CM | POA: Insufficient documentation

## 2012-04-28 DIAGNOSIS — L89309 Pressure ulcer of unspecified buttock, unspecified stage: Secondary | ICD-10-CM | POA: Insufficient documentation

## 2012-04-28 DIAGNOSIS — W19XXXS Unspecified fall, sequela: Secondary | ICD-10-CM | POA: Insufficient documentation

## 2012-04-28 DIAGNOSIS — L8990 Pressure ulcer of unspecified site, unspecified stage: Secondary | ICD-10-CM | POA: Insufficient documentation

## 2012-04-28 DIAGNOSIS — I10 Essential (primary) hypertension: Secondary | ICD-10-CM | POA: Insufficient documentation

## 2012-04-28 NOTE — Progress Notes (Signed)
[facility]  NAME:  John Meza, John Meza NO.:  [id number]  MEDICAL RECORD NO.:  [id number]      DATE OF BIRTH:  [birth year]  PHYSICIAN:  Maxwell Caul, M.D. VISIT DATE:  04/28/2012                                  OFFICE VISIT   LOCATION:  [facility].  [name] is a 51 year old man who was injured in February of this year after falling off a ladder at work.  He suffered a T11 spinal cord injury with resultant hemiplegia, bowel and bladder incontinence. He was able to learn to transfer from bed to wheelchair with a sliding board.  He spent time at rehabilitation, but is now living at home with his wife.  He uses a wheelchair which has a pressure off-loading cushion.  Approximately 2 weeks ago, the wife noted a small draining area on his right buttock.  This is expanded and he has been left with a wound.  He is here for our review of this.  PAST MEDICAL HISTORY:  Type 2 diabetes, hypertension, traumatic spinal cord injury T11-L1 status post surgery in February of this year.  MEDICATION LIST:  Reviewed.   On examination, his temperature is 98.3, pulse 85, respirations 16, blood pressure 102/68.  Wound exam, the area in question is in the inferior aspect of the right buttock in the gluteal fold.  This measures 1.5 x 1 x 0.1.  It is generally a clean wound which is superficial. There is no evidence of deep tissue injury or infection at this point, nothing needed culturing.  IMPRESSION:  Pressure ulcer, right buttock as described.  I really do not see this as an onerous issue at present.  I am not sure whether this comes from positioning in the wheelchair and/or the sliding board itself which could create a friction in this area.  We spent some time talking about pressure relief maneuvers including the bad sliding board and the wheelchair.  As mentioned, he does have a pressure offloading cushion, however there is  a ridge where the wound is located.  We prescribed DuoDerm which can be changed every 2-3 days.  I am hopeful that this and the pressure relief mechanisms we outlined will be sufficient to heal this.  He does not have a history of pressure wounds to date.          ______________________________ Maxwell Caul, M.D.    MGR/MEDQ  D:  04/28/2012  T:  04/28/2012  Job:  [id number]

## 2012-04-29 ENCOUNTER — Ambulatory Visit: Payer: Worker's Compensation | Admitting: *Deleted

## 2012-05-05 ENCOUNTER — Encounter: Payer: Self-pay | Admitting: Occupational Therapy

## 2012-05-12 ENCOUNTER — Encounter (HOSPITAL_BASED_OUTPATIENT_CLINIC_OR_DEPARTMENT_OTHER): Payer: Worker's Compensation | Attending: Internal Medicine

## 2012-05-12 DIAGNOSIS — E119 Type 2 diabetes mellitus without complications: Secondary | ICD-10-CM | POA: Insufficient documentation

## 2012-05-12 DIAGNOSIS — I10 Essential (primary) hypertension: Secondary | ICD-10-CM | POA: Insufficient documentation

## 2012-05-12 DIAGNOSIS — L89309 Pressure ulcer of unspecified buttock, unspecified stage: Secondary | ICD-10-CM | POA: Insufficient documentation

## 2012-05-12 DIAGNOSIS — Z79899 Other long term (current) drug therapy: Secondary | ICD-10-CM | POA: Insufficient documentation

## 2012-05-12 DIAGNOSIS — L8992 Pressure ulcer of unspecified site, stage 2: Secondary | ICD-10-CM | POA: Insufficient documentation

## 2012-05-17 ENCOUNTER — Ambulatory Visit: Payer: Worker's Compensation | Attending: Physical Medicine & Rehabilitation | Admitting: Occupational Therapy

## 2012-05-17 DIAGNOSIS — IMO0001 Reserved for inherently not codable concepts without codable children: Secondary | ICD-10-CM | POA: Insufficient documentation

## 2012-05-17 DIAGNOSIS — M6281 Muscle weakness (generalized): Secondary | ICD-10-CM | POA: Insufficient documentation

## 2012-05-17 DIAGNOSIS — M25519 Pain in unspecified shoulder: Secondary | ICD-10-CM | POA: Insufficient documentation

## 2012-05-18 ENCOUNTER — Ambulatory Visit: Payer: Worker's Compensation | Admitting: Occupational Therapy

## 2012-05-19 LAB — GLUCOSE, CAPILLARY: Glucose-Capillary: 227 mg/dL — ABNORMAL HIGH (ref 70–99)

## 2012-05-24 ENCOUNTER — Ambulatory Visit: Payer: Worker's Compensation | Attending: Physical Medicine & Rehabilitation | Admitting: *Deleted

## 2012-05-24 DIAGNOSIS — M6281 Muscle weakness (generalized): Secondary | ICD-10-CM | POA: Insufficient documentation

## 2012-05-24 DIAGNOSIS — M25519 Pain in unspecified shoulder: Secondary | ICD-10-CM | POA: Insufficient documentation

## 2012-05-24 DIAGNOSIS — IMO0001 Reserved for inherently not codable concepts without codable children: Secondary | ICD-10-CM | POA: Insufficient documentation

## 2012-05-25 ENCOUNTER — Ambulatory Visit: Payer: Worker's Compensation | Attending: Physical Medicine & Rehabilitation | Admitting: *Deleted

## 2012-05-25 DIAGNOSIS — M25519 Pain in unspecified shoulder: Secondary | ICD-10-CM | POA: Insufficient documentation

## 2012-05-25 DIAGNOSIS — M6281 Muscle weakness (generalized): Secondary | ICD-10-CM | POA: Insufficient documentation

## 2012-05-25 DIAGNOSIS — IMO0001 Reserved for inherently not codable concepts without codable children: Secondary | ICD-10-CM | POA: Insufficient documentation

## 2012-05-26 ENCOUNTER — Encounter (HOSPITAL_BASED_OUTPATIENT_CLINIC_OR_DEPARTMENT_OTHER): Payer: Worker's Compensation

## 2012-05-27 ENCOUNTER — Encounter: Payer: Self-pay | Admitting: Occupational Therapy

## 2012-05-30 ENCOUNTER — Encounter: Payer: Worker's Compensation | Admitting: Physical Medicine & Rehabilitation

## 2012-05-30 ENCOUNTER — Telehealth: Payer: Self-pay | Admitting: Physical Medicine & Rehabilitation

## 2012-05-30 NOTE — Telephone Encounter (Signed)
Patient is wearing adult diapers due to in between cath leakage.  Is this something Dr will agree to and order?  Fax to [phone].

## 2012-05-30 NOTE — Telephone Encounter (Signed)
Would you be okay with ordering these for the patient or should they get these from his PCP?

## 2012-05-31 NOTE — Telephone Encounter (Signed)
This has been faxed.

## 2012-05-31 NOTE — Telephone Encounter (Signed)
Certainly.  i will hand-write an RX

## 2012-06-01 ENCOUNTER — Encounter: Payer: Self-pay | Admitting: Occupational Therapy

## 2012-06-03 ENCOUNTER — Ambulatory Visit: Payer: Worker's Compensation | Attending: Physical Medicine & Rehabilitation | Admitting: Occupational Therapy

## 2012-06-03 DIAGNOSIS — M6281 Muscle weakness (generalized): Secondary | ICD-10-CM | POA: Insufficient documentation

## 2012-06-03 DIAGNOSIS — M25519 Pain in unspecified shoulder: Secondary | ICD-10-CM | POA: Insufficient documentation

## 2012-06-03 DIAGNOSIS — IMO0001 Reserved for inherently not codable concepts without codable children: Secondary | ICD-10-CM | POA: Insufficient documentation

## 2012-06-07 ENCOUNTER — Ambulatory Visit: Payer: Worker's Compensation | Attending: Physical Medicine & Rehabilitation | Admitting: Occupational Therapy

## 2012-06-07 DIAGNOSIS — M6281 Muscle weakness (generalized): Secondary | ICD-10-CM | POA: Insufficient documentation

## 2012-06-07 DIAGNOSIS — M25519 Pain in unspecified shoulder: Secondary | ICD-10-CM | POA: Insufficient documentation

## 2012-06-07 DIAGNOSIS — IMO0001 Reserved for inherently not codable concepts without codable children: Secondary | ICD-10-CM | POA: Insufficient documentation

## 2012-06-14 ENCOUNTER — Encounter: Payer: Self-pay | Admitting: Occupational Therapy

## 2012-06-16 ENCOUNTER — Ambulatory Visit: Payer: Worker's Compensation | Attending: Physical Medicine & Rehabilitation | Admitting: Occupational Therapy

## 2012-06-16 DIAGNOSIS — IMO0001 Reserved for inherently not codable concepts without codable children: Secondary | ICD-10-CM | POA: Insufficient documentation

## 2012-06-16 DIAGNOSIS — M25519 Pain in unspecified shoulder: Secondary | ICD-10-CM | POA: Insufficient documentation

## 2012-06-16 DIAGNOSIS — M6281 Muscle weakness (generalized): Secondary | ICD-10-CM | POA: Insufficient documentation

## 2012-06-20 ENCOUNTER — Encounter: Payer: Self-pay | Admitting: *Deleted

## 2012-06-21 ENCOUNTER — Encounter
Payer: Worker's Compensation | Attending: Physical Medicine & Rehabilitation | Admitting: Physical Medicine & Rehabilitation

## 2012-06-21 ENCOUNTER — Encounter: Payer: Self-pay | Admitting: Physical Medicine & Rehabilitation

## 2012-06-21 VITALS — BP 112/61 | HR 82 | Resp 12 | Ht 64.0 in | Wt 180.0 lb

## 2012-06-21 DIAGNOSIS — M7522 Bicipital tendinitis, left shoulder: Secondary | ICD-10-CM

## 2012-06-21 DIAGNOSIS — M7520 Bicipital tendinitis, unspecified shoulder: Secondary | ICD-10-CM | POA: Insufficient documentation

## 2012-06-21 DIAGNOSIS — S73192A Other sprain of left hip, initial encounter: Secondary | ICD-10-CM

## 2012-06-21 DIAGNOSIS — W11XXXA Fall on and from ladder, initial encounter: Secondary | ICD-10-CM

## 2012-06-21 DIAGNOSIS — K592 Neurogenic bowel, not elsewhere classified: Secondary | ICD-10-CM

## 2012-06-21 DIAGNOSIS — N319 Neuromuscular dysfunction of bladder, unspecified: Secondary | ICD-10-CM

## 2012-06-21 DIAGNOSIS — X58XXXS Exposure to other specified factors, sequela: Secondary | ICD-10-CM | POA: Insufficient documentation

## 2012-06-21 DIAGNOSIS — IMO0002 Reserved for concepts with insufficient information to code with codable children: Secondary | ICD-10-CM | POA: Insufficient documentation

## 2012-06-21 MED ORDER — NAPROXEN 500 MG PO TABS: 500.0000 mg | ORAL_TABLET | Freq: Two times a day (BID) | ORAL | Status: DC
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
## 2012-06-21 NOTE — Patient Instructions (Addendum)
HOLD OFF ON FURTHER THERAPY FOR NOW PENDING YOUR TEST RESULTS  PLEASE USE ICE ON YOUR SHOULDER AND GENTLE RANGE OF MOTION.

## 2012-06-22 ENCOUNTER — Encounter: Payer: Self-pay | Admitting: Occupational Therapy

## 2012-06-28 ENCOUNTER — Encounter: Payer: Self-pay | Admitting: Occupational Therapy

## 2012-06-30 ENCOUNTER — Encounter: Payer: Self-pay | Admitting: Occupational Therapy

## 2012-07-04 ENCOUNTER — Encounter: Payer: Self-pay | Admitting: Occupational Therapy

## 2012-07-06 ENCOUNTER — Encounter: Payer: Self-pay | Admitting: Occupational Therapy

## 2012-07-11 ENCOUNTER — Encounter: Payer: Self-pay | Admitting: Occupational Therapy

## 2012-07-13 ENCOUNTER — Encounter: Payer: Self-pay | Admitting: Occupational Therapy

## 2012-07-22 ENCOUNTER — Encounter: Payer: Self-pay | Admitting: Physical Medicine & Rehabilitation

## 2012-08-05 ENCOUNTER — Emergency Department (HOSPITAL_COMMUNITY): Payer: No Typology Code available for payment source

## 2012-08-05 ENCOUNTER — Encounter (HOSPITAL_COMMUNITY): Payer: Self-pay | Admitting: *Deleted

## 2012-08-05 ENCOUNTER — Emergency Department (HOSPITAL_COMMUNITY)
Admission: EM | Admit: 2012-08-05 | Discharge: 2012-08-05 | Disposition: A | Discharge status: Home / Self Care | Payer: No Typology Code available for payment source | Attending: Emergency Medicine | Admitting: Emergency Medicine

## 2012-08-05 DIAGNOSIS — I10 Essential (primary) hypertension: Secondary | ICD-10-CM | POA: Insufficient documentation

## 2012-08-05 DIAGNOSIS — E119 Type 2 diabetes mellitus without complications: Secondary | ICD-10-CM | POA: Insufficient documentation

## 2012-08-05 DIAGNOSIS — Y9389 Activity, other specified: Secondary | ICD-10-CM | POA: Insufficient documentation

## 2012-08-05 DIAGNOSIS — Z79899 Other long term (current) drug therapy: Secondary | ICD-10-CM | POA: Insufficient documentation

## 2012-08-05 DIAGNOSIS — S24101A Unspecified injury at T1 level of thoracic spinal cord, initial encounter: Secondary | ICD-10-CM | POA: Insufficient documentation

## 2012-08-05 DIAGNOSIS — Z791 Long term (current) use of non-steroidal anti-inflammatories (NSAID): Secondary | ICD-10-CM | POA: Insufficient documentation

## 2012-08-05 NOTE — ED Notes (Signed)
Pt in radiology 
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
## 2012-08-12 ENCOUNTER — Encounter: Payer: Self-pay | Admitting: Physical Medicine & Rehabilitation

## 2012-08-12 ENCOUNTER — Encounter
Payer: Worker's Compensation | Attending: Physical Medicine & Rehabilitation | Admitting: Physical Medicine & Rehabilitation

## 2012-08-12 VITALS — BP 121/80 | HR 81 | Resp 14 | Ht 60.0 in | Wt 190.0 lb

## 2012-08-12 DIAGNOSIS — IMO0002 Reserved for concepts with insufficient information to code with codable children: Secondary | ICD-10-CM

## 2012-08-12 DIAGNOSIS — K592 Neurogenic bowel, not elsewhere classified: Secondary | ICD-10-CM

## 2012-08-12 DIAGNOSIS — S43429A Sprain of unspecified rotator cuff capsule, initial encounter: Secondary | ICD-10-CM | POA: Insufficient documentation

## 2012-08-12 DIAGNOSIS — N319 Neuromuscular dysfunction of bladder, unspecified: Secondary | ICD-10-CM

## 2012-08-12 DIAGNOSIS — M75102 Unspecified rotator cuff tear or rupture of left shoulder, not specified as traumatic: Secondary | ICD-10-CM

## 2012-08-12 DIAGNOSIS — X58XXXS Exposure to other specified factors, sequela: Secondary | ICD-10-CM | POA: Insufficient documentation

## 2012-08-12 MED ORDER — TRAMADOL HCL 50 MG PO TABS: 50.0000 mg | ORAL_TABLET | Freq: Three times a day (TID) | ORAL | Status: DC | PRN

## 2012-08-12 NOTE — Patient Instructions (Signed)
CALL ME WITH QUESTIONS 

## 2012-08-12 NOTE — Progress Notes (Signed)
Subjective:    Patient ID: John Meza, male    DOB: 10-02-1961, 51 y.o.   MRN: [id number]  HPI  John Meza is back regarding his spinal cord injury and associated problems. I injected around his left short head biceps tendon at last visit which didn't provide a lot of relief. I also referred him for an MRI which revealed small distal, partial supraspinatus tear and tendinosus of the supraspinatus and subscapularis. 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 He sees Dr. Amanda Pea next week for eval.  He tells me that pain is most severe when he abducts the shoulder. It's difficult when he tries to hold himsefl up, transfer, propel wheelchair, etc. He has less pain with shoulder flexion.  Pain radiates from his shoulder down to the elbow.  Bladder spasm, leakage remain an issue. He is under urology care for this.   Bowel program is regular  Skin is intact, no spasticity is reported.  Pain Inventory Average Pain 8 Pain Right Now 4 My pain is intermittent and stabbing  In the last 24 hours, has pain interfered with the following? General activity 6 Relation with others 6 Enjoyment of life 6 What TIME of day is your pain at its worst? night Sleep (in general) Poor  Pain is worse with: some activites Pain improves with: n/a Relief from Meds: 0  Mobility how many minutes can you walk? none ability to climb steps?  no do you drive?  no use a wheelchair needs help with transfers Do you have any goals in this area?  yes  Function employed # of hrs/week 11/2011 I need assistance with the following:  feeding, dressing, bathing, toileting, meal prep, household duties and shopping Do you have any goals in this area?  yes  Neuro/Psych bladder control problems  Prior Studies x-rays  Physicians involved in your care Any changes since last visit?  no   Family History  Problem Relation Age of Onset  . Diabetes Father    History   Social History  . Marital Status: Married   Spouse Name: N/A    Number of Children: N/A  . Years of Education: N/A   Social History Main Topics  . Smoking status: Never Smoker   . Smokeless tobacco: Never Used  . Alcohol Use: 0.6 oz/week    1 Cans of beer per week  . Drug Use: No  . Sexually Active: None   Other Topics Concern  . None   Social History Narrative  . None   Past Surgical History  Procedure Date  . Spine surgery    Past Medical History  Diagnosis Date  . Diabetes mellitus   . Hypertension   . Spine fracture 11/16/2011    T 11- T9-L1  . SCI (spinal cord injury)    BP 121/80  Pulse 81  Resp 14  Ht 5' (1.524 m)  Wt 190 lb (86.183 kg)  BMI 37.11 kg/m2  SpO2 99%    Review of Systems  Musculoskeletal: Positive for back pain.  All other systems reviewed and are negative.       Objective:   Physical Exam Constitutional: He is oriented to person, place, and time. He appears well-developed and well-nourished.  HENT:  Head: Normocephalic and atraumatic.  Eyes: Conjunctivae and EOM are normal. Pupils are equal, round, and reactive to light.  Neck: Normal range of motion.  Cardiovascular: Normal rate.  Pulmonary/Chest: Effort normal.  Abdominal: Soft.  Neurological: He is alert and oriented to person, place, and time.  A sensory deficit is present.  Reflex Scores:  Tricep reflexes are 1+ on the right side and 1+ on the left side.  Bicep reflexes are 1+ on the right side and 1+ on the left side.  Brachioradialis reflexes are 1+ on the right side and 1+ on the left side.  Patellar reflexes are 2+ on the right side and 2+ on the left side.  Achilles reflexes are 2+ on the right side and 2+ on the left side. T10-11 sensory level. No PP or LT below the level. No resting tone in legs. No volitional movement in either leg. Good sitting posture. He is wearing his brace.  Upper extremity neuro exam is within normal limits.  Musc: left biceps tendon short head is painful to palpation, speeds test is  positive. Flexion is painful. Apprehension test was positive and he had subacromial tenderness to palpation.  Skin: Skin is warm and dry. i see no evidence of breakdown.  Psychiatric: He has a normal mood and affect. His behavior is normal. Judgment and thought content normal.  Assessment & Plan:   1. Thoracic T11 fracture after fall with ASIA A spinal cord injury.  2. DVT proph with lovenox for approximately 3 more weeks  3. Pain management.  4. Neurogenic bowel and bladder with bladder spasms  5. Diabetes mellitus.  6. Hyperlipidemia  7. Left RTC partial tear, tendinosus    Plan:  1. After informed consent I injected via lateral approach the left shoulder with 40mg  methylprednisolone and 3cc 1% lidocaine. The patient was beginning to experience relief before he left the office today - I would like him to begin PT to address RTC mechanics, ROM, strengthening, as well as scapular stabilization.  -Unfortunately, given the nature of his injury, he will be dependent upon his UE's for mobility and they will be more exposed to wear and tear/ injury as a result. -Proceed with ortho consult as planned. This problem should be able to be rx'ed conservatively as above. 2.Continue with IC for bladder/spastic bladder.  -. Continue per uro recs. 3. Neuro Bowel. Continue bowel program. drop fiber and laxative, and continue daily OTC dulcolax suppository  4. Ultram was rx'ed 50mg  q8 prn for breakthrough pain.   5. I spoke with patient, his family, and the case manager at length regarding all of these issues. All questions were encouraged and answered.  6. I will see the patient back in about 2 months  30 minutes were spent with patient/case manager in total.

## 2012-08-16 ENCOUNTER — Ambulatory Visit: Payer: Worker's Compensation | Admitting: Physical Medicine & Rehabilitation

## 2012-08-25 ENCOUNTER — Ambulatory Visit: Payer: Worker's Compensation | Attending: Physical Medicine & Rehabilitation | Admitting: Occupational Therapy

## 2012-08-25 DIAGNOSIS — M6281 Muscle weakness (generalized): Secondary | ICD-10-CM | POA: Insufficient documentation

## 2012-08-25 DIAGNOSIS — M25519 Pain in unspecified shoulder: Secondary | ICD-10-CM | POA: Insufficient documentation

## 2012-08-25 DIAGNOSIS — M25619 Stiffness of unspecified shoulder, not elsewhere classified: Secondary | ICD-10-CM | POA: Insufficient documentation

## 2012-08-25 DIAGNOSIS — IMO0001 Reserved for inherently not codable concepts without codable children: Secondary | ICD-10-CM | POA: Insufficient documentation

## 2012-08-30 ENCOUNTER — Ambulatory Visit: Payer: Worker's Compensation | Attending: Physical Medicine & Rehabilitation | Admitting: Occupational Therapy

## 2012-08-30 DIAGNOSIS — M25519 Pain in unspecified shoulder: Secondary | ICD-10-CM | POA: Insufficient documentation

## 2012-08-30 DIAGNOSIS — IMO0001 Reserved for inherently not codable concepts without codable children: Secondary | ICD-10-CM | POA: Insufficient documentation

## 2012-08-30 DIAGNOSIS — M25619 Stiffness of unspecified shoulder, not elsewhere classified: Secondary | ICD-10-CM | POA: Insufficient documentation

## 2012-08-30 DIAGNOSIS — M6281 Muscle weakness (generalized): Secondary | ICD-10-CM | POA: Insufficient documentation

## 2012-09-01 ENCOUNTER — Ambulatory Visit: Payer: Worker's Compensation | Admitting: Occupational Therapy

## 2012-09-05 ENCOUNTER — Ambulatory Visit: Payer: Worker's Compensation | Admitting: Occupational Therapy

## 2012-09-13 ENCOUNTER — Ambulatory Visit: Payer: Worker's Compensation | Attending: Physical Medicine & Rehabilitation | Admitting: Occupational Therapy

## 2012-09-13 DIAGNOSIS — M6281 Muscle weakness (generalized): Secondary | ICD-10-CM | POA: Insufficient documentation

## 2012-09-13 DIAGNOSIS — IMO0001 Reserved for inherently not codable concepts without codable children: Secondary | ICD-10-CM | POA: Insufficient documentation

## 2012-09-13 DIAGNOSIS — M25619 Stiffness of unspecified shoulder, not elsewhere classified: Secondary | ICD-10-CM | POA: Insufficient documentation

## 2012-09-13 DIAGNOSIS — M25519 Pain in unspecified shoulder: Secondary | ICD-10-CM | POA: Insufficient documentation

## 2012-09-15 ENCOUNTER — Encounter: Payer: Self-pay | Admitting: Occupational Therapy

## 2012-09-20 ENCOUNTER — Ambulatory Visit: Payer: Worker's Compensation | Admitting: Occupational Therapy

## 2012-09-22 ENCOUNTER — Ambulatory Visit: Payer: Worker's Compensation | Admitting: Occupational Therapy

## 2012-09-27 ENCOUNTER — Encounter: Payer: Self-pay | Admitting: Occupational Therapy

## 2012-09-29 ENCOUNTER — Encounter: Payer: Self-pay | Admitting: Occupational Therapy

## 2012-10-03 ENCOUNTER — Ambulatory Visit: Payer: No Typology Code available for payment source | Admitting: Occupational Therapy

## 2012-10-10 ENCOUNTER — Encounter
Payer: Worker's Compensation | Attending: Physical Medicine & Rehabilitation | Admitting: Physical Medicine & Rehabilitation

## 2012-10-10 ENCOUNTER — Encounter: Payer: Self-pay | Admitting: Physical Medicine & Rehabilitation

## 2012-10-10 VITALS — BP 118/76 | HR 80 | Resp 14 | Ht 67.0 in | Wt 190.0 lb

## 2012-10-10 DIAGNOSIS — N319 Neuromuscular dysfunction of bladder, unspecified: Secondary | ICD-10-CM

## 2012-10-10 DIAGNOSIS — S43429A Sprain of unspecified rotator cuff capsule, initial encounter: Secondary | ICD-10-CM

## 2012-10-10 DIAGNOSIS — M7520 Bicipital tendinitis, unspecified shoulder: Secondary | ICD-10-CM

## 2012-10-10 DIAGNOSIS — M75102 Unspecified rotator cuff tear or rupture of left shoulder, not specified as traumatic: Secondary | ICD-10-CM

## 2012-10-10 DIAGNOSIS — X58XXXS Exposure to other specified factors, sequela: Secondary | ICD-10-CM | POA: Insufficient documentation

## 2012-10-10 DIAGNOSIS — K219 Gastro-esophageal reflux disease without esophagitis: Secondary | ICD-10-CM | POA: Insufficient documentation

## 2012-10-10 DIAGNOSIS — M7522 Bicipital tendinitis, left shoulder: Secondary | ICD-10-CM

## 2012-10-10 DIAGNOSIS — IMO0002 Reserved for concepts with insufficient information to code with codable children: Secondary | ICD-10-CM

## 2012-10-10 DIAGNOSIS — K592 Neurogenic bowel, not elsewhere classified: Secondary | ICD-10-CM

## 2012-10-10 MED ORDER — PANTOPRAZOLE SODIUM 40 MG PO TBEC: 40.0000 mg | DELAYED_RELEASE_TABLET | Freq: Every day | ORAL | Status: DC

## 2012-10-10 NOTE — Progress Notes (Signed)
Subjective:    Patient ID: John Meza, male    DOB: 04-10-1961, 51 y.o.   MRN: [id number]  HPI  John Meza is back regarding his SCI. He tells me that he has had gradual improvement in the left shoulder with each injection. At this point his pain is manageable. There is no plan for surgical intervention at this time given his progress. He has had no therapy since his injections.   He complains of acid reflux which has worsened since he' s come off the protonix? He was taking during and after his rehab stay.   He continues with IC and daily bowel program. He sees urology on 1/2 for botox injections to his bladder  Celebrex is effective for pain. He continues on it daily.    Pain Inventory Average Pain 5 Pain Right Now 1 My pain is dull  In the last 24 hours, has pain interfered with the following? General activity 3 Relation with others 0 Enjoyment of life 2 What TIME of day is your pain at its worst? night Sleep (in general) Fair  Pain is worse with: bending Pain improves with: injections Relief from Meds: 2  Mobility ability to climb steps?  no do you drive?  no use a wheelchair needs help with transfers Do you have any goals in this area?  no  Function disabled: date disabled 11/2011 I need assistance with the following:  dressing, bathing, toileting, meal prep, household duties and shopping Do you have any goals in this area?  no  Neuro/Psych bladder control problems bowel control problems numbness tingling trouble walking  Prior Studies Any changes since last visit?  no  Physicians involved in your care Any changes since last visit?  no   Family History  Problem Relation Age of Onset  . Diabetes Father    History   Social History  . Marital Status: Married    Spouse Name: N/A    Number of Children: N/A  . Years of Education: N/A   Social History Main Topics  . Smoking status: Never Smoker   . Smokeless tobacco: Never Used  . Alcohol Use:  0.6 oz/week    1 Cans of beer per week  . Drug Use: No  . Sexually Active: None   Other Topics Concern  . None   Social History Narrative  . None   Past Surgical History  Procedure Date  . Spine surgery    Past Medical History  Diagnosis Date  . Diabetes mellitus   . Hypertension   . Spine fracture 11/16/2011    T 11- T9-L1  . SCI (spinal cord injury)    BP 118/76  Pulse 80  Resp 14  Ht 5\' 7"  (1.702 m)  Wt 190 lb (86.183 kg)  BMI 29.76 kg/m2  SpO2 100%     Review of Systems  Constitutional: Positive for unexpected weight change.  Musculoskeletal: Positive for back pain and gait problem.  Neurological: Positive for numbness.  All other systems reviewed and are negative.       Objective:   Physical Exam Constitutional: He is oriented to person, place, and time. He appears well-developed and well-nourished.  HENT:  Head: Normocephalic and atraumatic.  Eyes: Conjunctivae and EOM are normal. Pupils are equal, round, and reactive to light.  Neck: Normal range of motion.  Cardiovascular: Normal rate.  Pulmonary/Chest: Effort normal.  Abdominal: Soft.  Neurological: He is alert and oriented to person, place, and time. A sensory deficit is present.  Reflex Scores:  Tricep reflexes are 1+ on the right side and 1+ on the left side.  Bicep reflexes are 1+ on the right side and 1+ on the left side.  Brachioradialis reflexes are 1+ on the right side and 1+ on the left side.  Patellar reflexes are 2+ on the right side and 2+ on the left side.  Achilles reflexes are 2+ on the right side and 2+ on the left side. T10-11 sensory level. No PP or LT below the level. No resting tone in legs. No volitional movement in either leg. Good sitting posture. He is wearing his brace.  Upper extremity neuro exam is within normal limits.  Musc: lleft shoulder ROM is improved in all planes.  Skin: Skin is warm and dry. i see no evidence of breakdown.  Psychiatric: He has a normal mood  and affect. His behavior is normal. Judgment and thought content normal.  Assessment & Plan:   1. Thoracic T11 fracture after fall with ASIA A spinal cord injury.  2. DVT proph with lovenox for approximately 3 more weeks  3. Pain management.  4. Neurogenic bowel and bladder with bladder spasms  5. Diabetes mellitus.  6. Hyperlipidemia  7. Left RTC partial tear, tendinosus   Plan:  1. Emphasized the importance of appropriate technique for transfers, self-care, etc to avoid exacerbating his injury. With appropriate caution and technique he can save himself problems down the line. He could follow up with PT to make sure that his technique is solid and reproducible, but he told me that he felt comfortable with his current techinque, so I will not pursue. He also needs to avoid overuse of the shoulders. 2.Continue with IC for bladder/spastic bladder.  -. Continue per uro recs. Botox injections are planned 3. Neuro Bowel. Continue bowel program. drop fiber and laxative, and continue daily OTC dulcolax suppository  4. Discussed use of celebrex. Will try to wean this to off over the next few months. Reintroduced protonix for GERD sx in the meantime.   5. I spoke with patient, his family, and the case manager at length regarding all of these issues. All questions were encouraged and answered.  6. I will see the patient back in about 6 months  30 minutes were spent with patient/case manager in total.

## 2012-10-10 NOTE — Patient Instructions (Signed)
TRY TO DECREASE YOUR USE OF CELEBREX OVER THE NEXT FEW MONTHS AND TAKE ONLY AS NEEDED.

## 2012-10-11 ENCOUNTER — Ambulatory Visit: Payer: No Typology Code available for payment source | Admitting: Occupational Therapy

## 2012-10-13 ENCOUNTER — Ambulatory Visit: Payer: No Typology Code available for payment source | Admitting: Occupational Therapy

## 2012-10-18 ENCOUNTER — Encounter: Payer: Self-pay | Admitting: Occupational Therapy

## 2012-10-20 ENCOUNTER — Encounter: Payer: Self-pay | Admitting: Occupational Therapy

## 2012-10-25 ENCOUNTER — Encounter: Payer: Self-pay | Admitting: Occupational Therapy

## 2012-10-27 ENCOUNTER — Encounter: Payer: Self-pay | Admitting: Occupational Therapy

## 2012-11-08 ENCOUNTER — Telehealth: Payer: Self-pay | Admitting: *Deleted

## 2012-11-08 NOTE — Telephone Encounter (Signed)
Received call from patients daughter, [name]. She states that the patient is having trouble digesting his food. Dr. Riley Kill d/c'd one of his medication to help with this and was told to call back if having problems. Would like a new prescription sent to [facility] on [address].

## 2012-11-08 NOTE — Telephone Encounter (Signed)
i did no such thing. i infact RESUMED protonix at last visit because he was having problems with indigestion.  He was on no other GI medications

## 2012-11-09 ENCOUNTER — Telehealth: Payer: Self-pay | Admitting: *Deleted

## 2012-11-09 NOTE — Telephone Encounter (Signed)
Notified [name] of OTC

## 2012-11-09 NOTE — Telephone Encounter (Signed)
It is over the counter.

## 2012-11-09 NOTE — Telephone Encounter (Signed)
Mr John Meza needs his sennekot. That is what they are thinking you discontinued at the appointment.  Can we reorder?

## 2012-11-09 NOTE — Telephone Encounter (Signed)
Notified [name] of Dr Riley Kill response, and medication WAS sent to [facility]

## 2012-12-23 ENCOUNTER — Telehealth: Payer: Self-pay

## 2012-12-23 NOTE — Telephone Encounter (Signed)
Patients daughter called to get something for dandruff and trouble going to the bathroom.  Advised to contact primary caregiver.  Patient daughter agreed.

## 2013-01-15 IMAGING — RF DG LUMBAR SPINE 2-3V
1 series · 3 of 3 positions shown · non-contrast
Comparison: CT 11/16/2011

CLINICAL DATA: T11 fracture

LUMBAR SPINE - 2-3 VIEW

[Series 1: run · 3 of 3 slices shown]
[im 1/3]
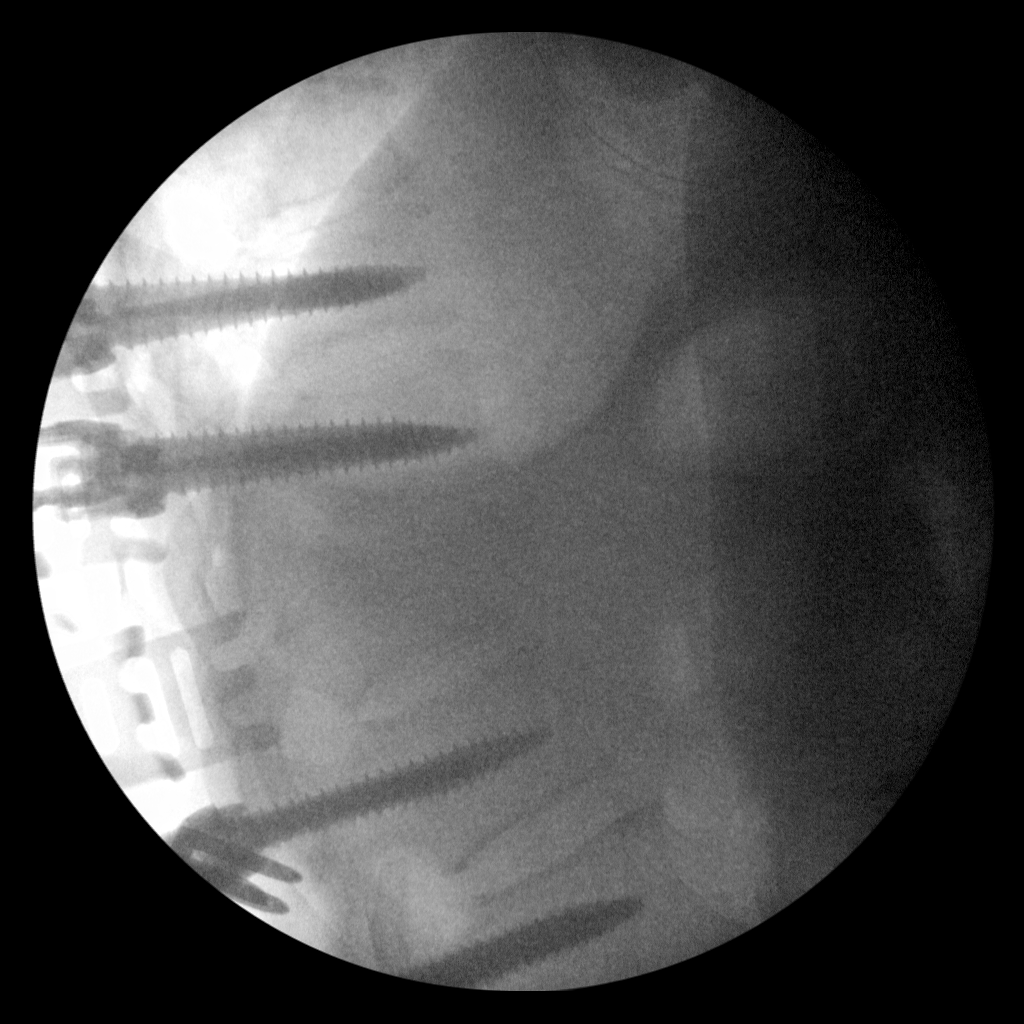
[im 2/3]
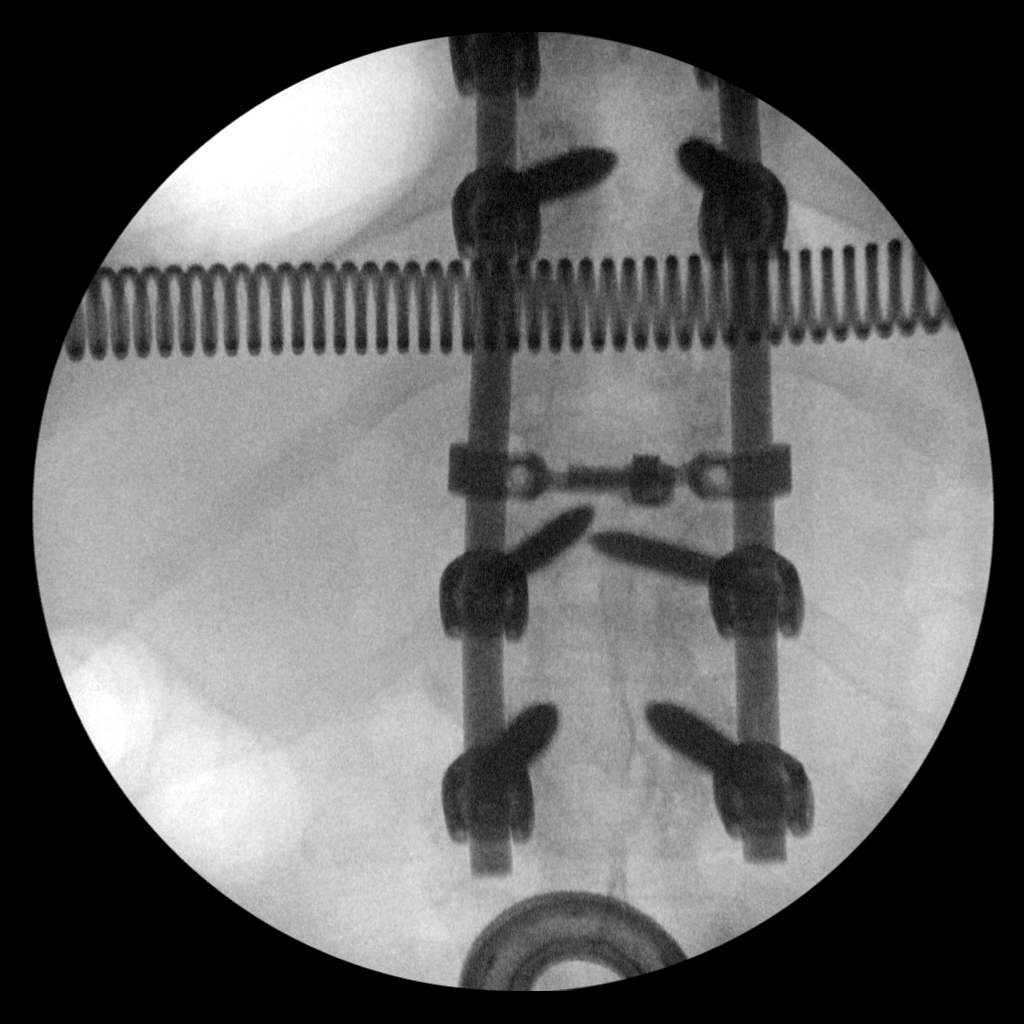
[im 3/3]
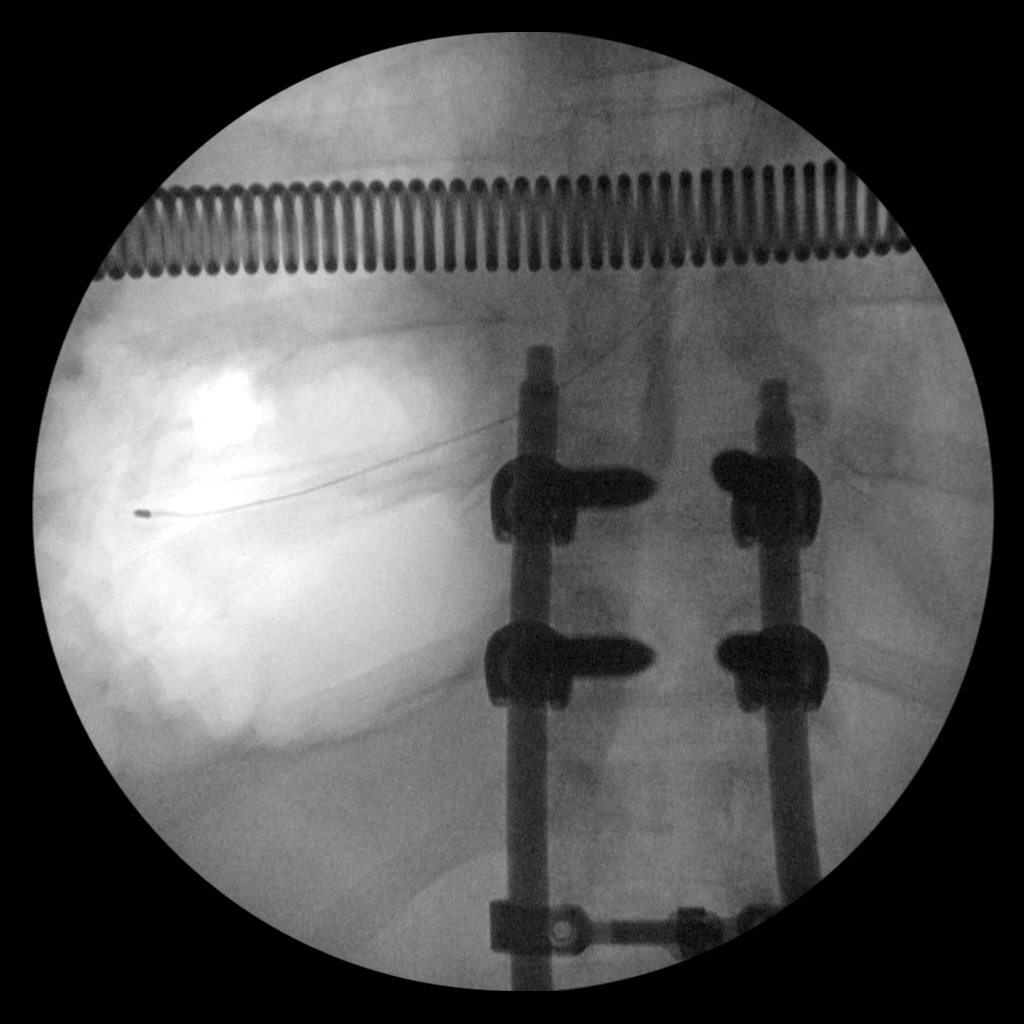

[3 of 3 positions shown; findings below may reference images not displayed]

FINDINGS: A series of four intraoperative fluoroscopic spot images
document posterior fusion with bilateral pedicle screws at T9, T10,
L1, and L2 with vertical interconnecting rods, intact.  T11
fracture is poorly visualized.
IMPRESSION: 1.  Posterior spinal fusion T9-L2.

## 2013-01-15 IMAGING — CR DG LUMBAR SPINE 1V
1 series · 1 of 1 positions shown · non-contrast
Comparison: 11/16/2011

CLINICAL DATA: T11 fracture

LUMBAR SPINE - 1 VIEW

[view not recorded]
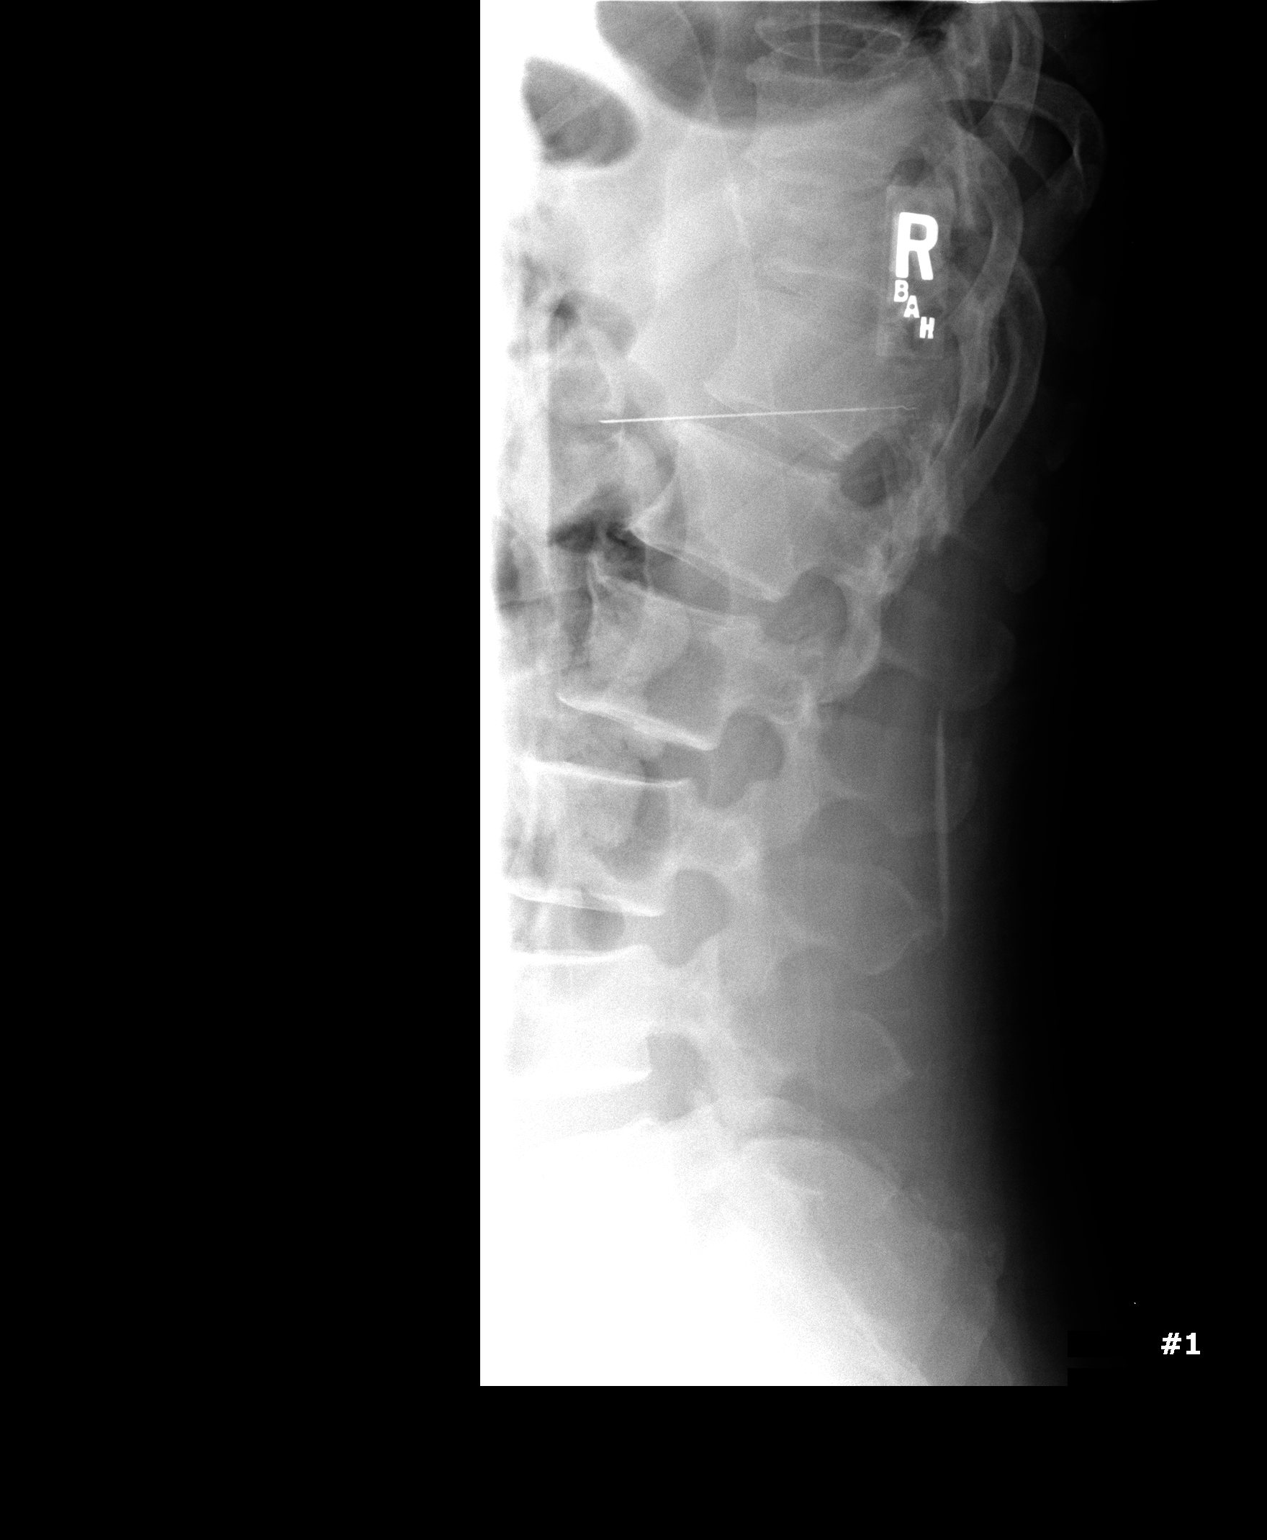

[1 of 1 positions shown; findings below may reference images not displayed]

FINDINGS: On this single lateral intraoperative radiograph, a
linear metallic density projects across the T12-L1 interspace.
Comminuted fracture of the T11 vertebral body is noted, partially
obscured by overlying marker.
IMPRESSION: 1.  Intraoperative localization.

## 2013-04-04 ENCOUNTER — Encounter: Payer: Self-pay | Admitting: Physical Medicine & Rehabilitation

## 2013-04-04 ENCOUNTER — Encounter
Payer: Worker's Compensation | Attending: Physical Medicine & Rehabilitation | Admitting: Physical Medicine & Rehabilitation

## 2013-04-04 VITALS — BP 129/76 | HR 81 | Resp 16 | Ht 67.0 in | Wt 190.0 lb

## 2013-04-04 DIAGNOSIS — N319 Neuromuscular dysfunction of bladder, unspecified: Secondary | ICD-10-CM

## 2013-04-04 DIAGNOSIS — N3289 Other specified disorders of bladder: Secondary | ICD-10-CM | POA: Insufficient documentation

## 2013-04-04 DIAGNOSIS — M7520 Bicipital tendinitis, unspecified shoulder: Secondary | ICD-10-CM

## 2013-04-04 DIAGNOSIS — S73192D Other sprain of left hip, subsequent encounter: Secondary | ICD-10-CM

## 2013-04-04 DIAGNOSIS — Z5189 Encounter for other specified aftercare: Secondary | ICD-10-CM

## 2013-04-04 DIAGNOSIS — S43429A Sprain of unspecified rotator cuff capsule, initial encounter: Secondary | ICD-10-CM

## 2013-04-04 DIAGNOSIS — IMO0002 Reserved for concepts with insufficient information to code with codable children: Secondary | ICD-10-CM

## 2013-04-04 DIAGNOSIS — X58XXXS Exposure to other specified factors, sequela: Secondary | ICD-10-CM | POA: Insufficient documentation

## 2013-04-04 DIAGNOSIS — K219 Gastro-esophageal reflux disease without esophagitis: Secondary | ICD-10-CM | POA: Insufficient documentation

## 2013-04-04 DIAGNOSIS — M719 Bursopathy, unspecified: Secondary | ICD-10-CM | POA: Insufficient documentation

## 2013-04-04 DIAGNOSIS — L989 Disorder of the skin and subcutaneous tissue, unspecified: Secondary | ICD-10-CM | POA: Insufficient documentation

## 2013-04-04 DIAGNOSIS — M7522 Bicipital tendinitis, left shoulder: Secondary | ICD-10-CM

## 2013-04-04 DIAGNOSIS — E785 Hyperlipidemia, unspecified: Secondary | ICD-10-CM | POA: Insufficient documentation

## 2013-04-04 DIAGNOSIS — M25519 Pain in unspecified shoulder: Secondary | ICD-10-CM | POA: Insufficient documentation

## 2013-04-04 DIAGNOSIS — M75102 Unspecified rotator cuff tear or rupture of left shoulder, not specified as traumatic: Secondary | ICD-10-CM

## 2013-04-04 DIAGNOSIS — E119 Type 2 diabetes mellitus without complications: Secondary | ICD-10-CM | POA: Insufficient documentation

## 2013-04-04 DIAGNOSIS — M67919 Unspecified disorder of synovium and tendon, unspecified shoulder: Secondary | ICD-10-CM | POA: Insufficient documentation

## 2013-04-04 DIAGNOSIS — Z5181 Encounter for therapeutic drug level monitoring: Secondary | ICD-10-CM

## 2013-04-04 DIAGNOSIS — I10 Essential (primary) hypertension: Secondary | ICD-10-CM | POA: Insufficient documentation

## 2013-04-04 DIAGNOSIS — Z79899 Other long term (current) drug therapy: Secondary | ICD-10-CM

## 2013-04-04 DIAGNOSIS — K592 Neurogenic bowel, not elsewhere classified: Secondary | ICD-10-CM | POA: Insufficient documentation

## 2013-04-04 LAB — URINALYSIS
Bilirubin Urine: NEGATIVE
Glucose, UA: NEGATIVE mg/dL
Protein, ur: NEGATIVE mg/dL
pH: 5.5 (ref 5.0–8.0)

## 2013-04-04 NOTE — Progress Notes (Signed)
Subjective:    Patient ID: John Meza, male    DOB: 06-29-1961, 52 y.o.   MRN: [id number]  HPI  John Meza is back regarding his pain and SCI related issues. His left shoulder sometimes is tender but it appears to be manageable.   He is reporting some dryness along his scalp which hasn't responded to lotions, shampoos etc. He was told that "it wasn't dandruff."  [name] denies UTI related symptoms. He hasn't had his urine checked for some time. He is still seeing urology in follow up.  He self-caths 4x per day.  Bowel movements are qam with program.    Pain Inventory Average Pain 0 Pain Right Now 0 My pain is na  In the last 24 hours, has pain interfered with the following? General activity 3 Relation with others na Enjoyment of life na What TIME of day is your pain at its worst? morning Sleep (in general) NA  Pain is worse with: bending Pain improves with: other Relief from Meds: 3  Mobility Do you have any goals in this area?  no  Function Do you have any goals in this area?  no  Neuro/Psych bladder control problems bowel control problems weakness numbness tremor tingling trouble walking  Prior Studies Any changes since last visit?  no  Physicians involved in your care Any changes since last visit?  no   Family History  Problem Relation Age of Onset  . Diabetes Father    History   Social History  . Marital Status: Married    Spouse Name: N/A    Number of Children: N/A  . Years of Education: N/A   Social History Main Topics  . Smoking status: Never Smoker   . Smokeless tobacco: Never Used  . Alcohol Use: 0.6 oz/week    1 Cans of beer per week  . Drug Use: No  . Sexually Active: None   Other Topics Concern  . None   Social History Narrative  . None   Past Surgical History  Procedure Laterality Date  . Spine surgery     Past Medical History  Diagnosis Date  . Diabetes mellitus   . Hypertension   . Spine fracture 11/16/2011    T 11-  T9-L1  . SCI (spinal cord injury)    BP 129/76  Pulse 81  Resp 16  Ht 5\' 7"  (1.702 m)  Wt 190 lb (86.183 kg)  BMI 29.75 kg/m2  SpO2 97%     Review of Systems  Constitutional: Positive for diaphoresis.  Cardiovascular: Positive for leg swelling.  Musculoskeletal: Positive for gait problem.  Skin: Positive for rash.  Neurological: Positive for tremors, weakness and numbness.  All other systems reviewed and are negative.       Objective:   Physical Exam  Constitutional: He is oriented to person, place, and time. He appears well-developed and well-nourished.  HENT: skin scalp clean, appears moist. A few flakes of dry skin seen. No rash Head: Normocephalic and atraumatic.  Eyes: Conjunctivae and EOM are normal. Pupils are equal, round, and reactive to light.  Neck: Normal range of motion.  Cardiovascular: Normal rate.  Pulmonary/Chest: Effort normal.  Abdominal: Soft.  Neurological: He is alert and oriented to person, place, and time. A sensory deficit is present.  Reflex Scores:  Tricep reflexes are 1+ on the right side and 1+ on the left side.  Bicep reflexes are 1+ on the right side and 1+ on the left side.  Brachioradialis reflexes are 1+ on the  right side and 1+ on the left side.  Patellar reflexes are 2+ on the right side and 2+ on the left side.  Achilles reflexes are 2+ on the right side and 2+ on the left side. T10-11 sensory level. No PP or LT below the level. No resting tone in legs. No volitional movement in either leg. Good sitting posture. NO resting tone in legs.   Upper extremity neuro exam is within normal limits.  Musc: lleft shoulder ROM is improved in all planes.  Skin: Skin is warm and dry. i see no evidence of breakdown.  Psychiatric: He has a normal mood and affect. His behavior is normal. Judgment and thought content normal.  Assessment & Plan:   1. Thoracic T11 fracture after fall with ASIA A spinal cord injury.  2. DVT proph with lovenox for  approximately 3 more weeks  3. Pain management.  4. Neurogenic bowel and bladder with bladder spasms  5. Diabetes mellitus.  6. Hyperlipidemia  7. Left RTC partial tear, tendinosus    Plan:  1. Discussed conservation as it pertains to his shoulders. He seems to be doing a pretty good job with this. 2.Continue with IC for bladder/spastic bladder.  -It could be myrbetriq be causing him dry skin/itching on his scalp---asked him to stop it temporarily to see if there's a difference.  -will check urine culture and analysis -reviewed warning signs for a UTI 3. Neuro Bowel. Continue bowel program.   continue daily OTC dulcolax suppository if needed/ digital stim  -imodium for diarrhea if needed.  4. Discussed use of celebrex. Will try to wean this to off over the next few months. Reintroduced protonix for GERD sx in the meantime.  5. I spoke with patient, his family, and the case manager at length regarding all of these issues. All questions were encouraged and answered.  6. I will see the patient yearly for follow up.  30 minutes were spent with patient/case manager in total.

## 2013-04-04 NOTE — Addendum Note (Signed)
Addended by: Judd Gaudier on: 04/04/2013 11:00 AM   Modules accepted: Orders

## 2013-04-04 NOTE — Patient Instructions (Addendum)
THINGS TO LOOK FOR IF YOU ARE CONCERNED YOU HAVE A URINARY TRACT INFECTION:  1. FEVER 2. BAD SMELLING URINE 3. CLOUDY URINE 4. DARKER COLORED URINE 5. URINE LEAKING FROM BLADDER BETWEEN CATHS 6. INCREASED BLADDER SPASMS 7. INCREASED LEG SPASMS 8. YOU MIGHT FEEL TIRED OR BAD   STOP THE MYRBETRIQ FOR NOW TO SEE IF YOUR ITCHING STOPS.

## 2013-04-12 ENCOUNTER — Telehealth: Payer: Self-pay

## 2013-04-12 MED ORDER — CIPROFLOXACIN HCL 250 MG PO TABS: 250.0000 mg | ORAL_TABLET | Freq: Two times a day (BID) | ORAL | Status: DC

## 2013-04-12 NOTE — Telephone Encounter (Signed)
cipro 250mg  bid for 5 days #10

## 2013-04-12 NOTE — Telephone Encounter (Signed)
Left message for family informing them cipro was sent to pharmacy.

## 2013-04-12 NOTE — Telephone Encounter (Signed)
Per [facility] rep urine culture was not done due to them not getting the order.  It was sent but did not get done and now its to late to use the urine so it would need to be redone.  Please advise.

## 2013-05-26 ENCOUNTER — Telehealth: Payer: Self-pay | Admitting: Physical Medicine & Rehabilitation

## 2013-05-26 NOTE — Telephone Encounter (Signed)
Daughter wants to know if you can restart his bladder medication.  She isn't sure what the medication is called, but is for urinating too much.

## 2013-05-26 NOTE — Telephone Encounter (Signed)
Attempted to call but no answer and no voicemail picked up

## 2013-07-22 ENCOUNTER — Other Ambulatory Visit: Payer: Self-pay | Admitting: Physical Medicine & Rehabilitation

## 2013-08-25 ENCOUNTER — Other Ambulatory Visit: Payer: Self-pay | Admitting: Physical Medicine & Rehabilitation

## 2014-03-27 ENCOUNTER — Ambulatory Visit: Payer: Self-pay | Admitting: Physical Medicine & Rehabilitation

## 2014-09-11 ENCOUNTER — Other Ambulatory Visit: Payer: Self-pay | Admitting: Physical Medicine & Rehabilitation

## 2015-03-07 ENCOUNTER — Ambulatory Visit: Payer: No Typology Code available for payment source | Attending: Internal Medicine

## 2015-04-29 ENCOUNTER — Emergency Department (HOSPITAL_COMMUNITY): Payer: Medicaid Other

## 2015-04-29 ENCOUNTER — Inpatient Hospital Stay (HOSPITAL_COMMUNITY)
Admission: EM | Admit: 2015-04-29 | Discharge: 2015-05-06 | DRG: 417 | Disposition: A | Discharge status: Home / Self Care | Payer: Medicaid Other | Attending: Internal Medicine | Admitting: Internal Medicine

## 2015-04-29 ENCOUNTER — Encounter (HOSPITAL_COMMUNITY): Payer: Self-pay | Admitting: *Deleted

## 2015-04-29 DIAGNOSIS — N319 Neuromuscular dysfunction of bladder, unspecified: Secondary | ICD-10-CM | POA: Diagnosis present

## 2015-04-29 DIAGNOSIS — E1165 Type 2 diabetes mellitus with hyperglycemia: Secondary | ICD-10-CM | POA: Diagnosis present

## 2015-04-29 DIAGNOSIS — A419 Sepsis, unspecified organism: Secondary | ICD-10-CM | POA: Diagnosis present

## 2015-04-29 DIAGNOSIS — I10 Essential (primary) hypertension: Secondary | ICD-10-CM | POA: Diagnosis present

## 2015-04-29 DIAGNOSIS — E785 Hyperlipidemia, unspecified: Secondary | ICD-10-CM | POA: Diagnosis present

## 2015-04-29 DIAGNOSIS — E871 Hypo-osmolality and hyponatremia: Secondary | ICD-10-CM | POA: Diagnosis present

## 2015-04-29 DIAGNOSIS — E861 Hypovolemia: Secondary | ICD-10-CM | POA: Diagnosis not present

## 2015-04-29 DIAGNOSIS — IMO0001 Reserved for inherently not codable concepts without codable children: Secondary | ICD-10-CM | POA: Diagnosis present

## 2015-04-29 DIAGNOSIS — R932 Abnormal findings on diagnostic imaging of liver and biliary tract: Secondary | ICD-10-CM | POA: Diagnosis present

## 2015-04-29 DIAGNOSIS — E876 Hypokalemia: Secondary | ICD-10-CM | POA: Diagnosis present

## 2015-04-29 DIAGNOSIS — G8220 Paraplegia, unspecified: Secondary | ICD-10-CM | POA: Diagnosis present

## 2015-04-29 DIAGNOSIS — K810 Acute cholecystitis: Secondary | ICD-10-CM | POA: Diagnosis present

## 2015-04-29 DIAGNOSIS — K8050 Calculus of bile duct without cholangitis or cholecystitis without obstruction: Secondary | ICD-10-CM | POA: Diagnosis present

## 2015-04-29 DIAGNOSIS — Z993 Dependence on wheelchair: Secondary | ICD-10-CM

## 2015-04-29 DIAGNOSIS — R1013 Epigastric pain: Secondary | ICD-10-CM | POA: Diagnosis present

## 2015-04-29 DIAGNOSIS — E119 Type 2 diabetes mellitus without complications: Secondary | ICD-10-CM | POA: Diagnosis present

## 2015-04-29 DIAGNOSIS — R000 Tachycardia, unspecified: Secondary | ICD-10-CM | POA: Diagnosis present

## 2015-04-29 DIAGNOSIS — R109 Unspecified abdominal pain: Secondary | ICD-10-CM | POA: Insufficient documentation

## 2015-04-29 DIAGNOSIS — M546 Pain in thoracic spine: Secondary | ICD-10-CM

## 2015-04-29 DIAGNOSIS — K831 Obstruction of bile duct: Secondary | ICD-10-CM | POA: Diagnosis present

## 2015-04-29 DIAGNOSIS — K819 Cholecystitis, unspecified: Secondary | ICD-10-CM | POA: Diagnosis present

## 2015-04-29 DIAGNOSIS — E669 Obesity, unspecified: Secondary | ICD-10-CM | POA: Diagnosis present

## 2015-04-29 DIAGNOSIS — K8067 Calculus of gallbladder and bile duct with acute and chronic cholecystitis with obstruction: Principal | ICD-10-CM | POA: Diagnosis present

## 2015-04-29 HISTORY — DX: Paraplegia, unspecified: G82.20

## 2015-04-29 LAB — CBC
HCT: 42.7 % (ref 39.0–52.0)
Hemoglobin: 14.9 g/dL (ref 13.0–17.0)
MCH: 29.7 pg (ref 26.0–34.0)
MCHC: 34.9 g/dL (ref 30.0–36.0)
MCV: 85.1 fL (ref 78.0–100.0)
Platelets: 190 10*3/uL (ref 150–400)
RBC: 5.02 MIL/uL (ref 4.22–5.81)
RDW: 13.7 % (ref 11.5–15.5)
WBC: 10.7 10*3/uL — ABNORMAL HIGH (ref 4.0–10.5)

## 2015-04-29 LAB — BASIC METABOLIC PANEL
Anion gap: 13 (ref 5–15)
BUN: 10 mg/dL (ref 6–20)
CALCIUM: 9.4 mg/dL (ref 8.9–10.3)
CO2: 23 mmol/L (ref 22–32)
Chloride: 95 mmol/L — ABNORMAL LOW (ref 101–111)
Creatinine, Ser: 0.74 mg/dL (ref 0.61–1.24)
GLUCOSE: 382 mg/dL — AB (ref 65–99)
Potassium: 3.9 mmol/L (ref 3.5–5.1)
Sodium: 131 mmol/L — ABNORMAL LOW (ref 135–145)

## 2015-04-29 LAB — I-STAT TROPONIN, ED: Troponin i, poc: 0 ng/mL (ref 0.00–0.08)

## 2015-04-29 MED ORDER — PANTOPRAZOLE SODIUM 40 MG IV SOLR
40.0000 mg | Freq: Once | INTRAVENOUS | Status: AC
Start: 2015-04-29 — End: 2015-04-29
  Administered 2015-04-29: 40 mg via INTRAVENOUS
  Filled 2015-04-29: qty 40

## 2015-04-29 MED ORDER — IOHEXOL 350 MG/ML SOLN
100.0000 mL | Freq: Once | INTRAVENOUS | Status: AC | PRN
  Administered 2015-04-29: 100 mL via INTRAVENOUS

## 2015-04-29 MED ORDER — HYDROMORPHONE HCL 1 MG/ML IJ SOLN
1.0000 mg | Freq: Once | INTRAMUSCULAR | Status: AC
  Administered 2015-04-29: 1 mg via INTRAVENOUS
  Filled 2015-04-29: qty 1

## 2015-04-29 MED ORDER — MORPHINE SULFATE 4 MG/ML IJ SOLN
4.0000 mg | Freq: Once | INTRAMUSCULAR | Status: AC
  Administered 2015-04-29: 4 mg via INTRAVENOUS
  Filled 2015-04-29: qty 1

## 2015-04-29 NOTE — ED Notes (Signed)
MD at bedside. 

## 2015-04-29 NOTE — ED Notes (Signed)
CT called to inquire when pt can go to CT.  

## 2015-04-29 NOTE — ED Provider Notes (Signed)
CSN: [id number]     Arrival date & time 04/29/15  1800 History   First MD Initiated Contact with Patient 04/29/15 1936     Chief Complaint  Patient presents with  . Chest Pain     (Consider location/radiation/quality/duration/timing/severity/associated sxs/prior Treatment) HPI Patient developed sudden and sharp chest pain 3 evenings ago on Saturday. It started in his epigastrium and radiates up through into his back and towards his left shoulder. It is made worse with deep breath and certain movements. There is been no associated vomiting fever or cough. No lower extremity pain or swelling. Patient denies history of similar pain. Patient has history of hypertension but no history of pulmonary or cardiac disease. His wife reports that on a normal basis the blood pressure is pretty well controlled on medications. Past Medical History  Diagnosis Date  . Diabetes mellitus   . Hypertension   . Spine fracture 11/16/2011    T 11- T9-L1  . SCI (spinal cord injury)    Past Surgical History  Procedure Laterality Date  . Spine surgery     Family History  Problem Relation Age of Onset  . Diabetes Father    History  Substance Use Topics  . Smoking status: Never Smoker   . Smokeless tobacco: Never Used  . Alcohol Use: 0.6 oz/week    1 Cans of beer per week    Review of Systems 10 Systems reviewed and are negative for acute change except as noted in the HPI.    Allergies  Review of patient's allergies indicates no known allergies.  Home Medications   Prior to Admission medications   Medication Sig Start Date End Date Taking? Authorizing Provider  bisacodyl (DULCOLAX) 10 MG suppository Place 10 mg rectally as needed.    Historical Provider, MD  celecoxib (CELEBREX) 200 MG capsule Take 200 mg by mouth daily.    Historical Provider, MD  ciprofloxacin (CIPRO) 250 MG tablet Take 1 tablet (250 mg total) by mouth 2 (two) times daily. 04/12/13   Ranelle Oyster, MD  glimepiride (AMARYL) 2 MG  tablet Take 2 mg by mouth daily before breakfast.    Historical Provider, MD  lisinopril (PRINIVIL,ZESTRIL) 5 MG tablet Take 5 mg by mouth daily.    Historical Provider, MD  metFORMIN (GLUCOPHAGE) 1000 MG tablet Take 850 mg by mouth 2 (two) times daily with a meal.     Historical Provider, MD  mirabegron ER (MYRBETRIQ) 50 MG TB24 Take 50 mg by mouth daily.    Historical Provider, MD  nitrofurantoin (MACRODANTIN) 100 MG capsule Take 100 mg by mouth at bedtime.    Historical Provider, MD  pantoprazole (PROTONIX) 40 MG tablet TAKE ONE TABLET BY MOUTH ONCE DAILY 09/12/14   Ranelle Oyster, MD  terbinafine (LAMISIL) 250 MG tablet Take 250 mg by mouth daily.    Historical Provider, MD   BP 173/92 mmHg  Pulse 103  Temp(Src) 98.4 F (36.9 C) (Oral)  Resp 18  Ht  (1.702 m)  Wt 190 lb (86.183 kg)  BMI 29.75 kg/m2  SpO2 98% Physical Exam  Constitutional: He is oriented to person, place, and time. He appears well-developed and well-nourished.  The patient appears uncomfortable and to be in pain. He is however alert without acute respiratory distress. Color is good.  HENT:  Head: Normocephalic and atraumatic.  Eyes: EOM are normal. Pupils are equal, round, and reactive to light.  Neck: Neck supple.  Cardiovascular: Normal rate, regular rhythm, normal heart sounds and intact  distal pulses.   Pulmonary/Chest: Effort normal and breath sounds normal.  Abdominal: Soft. Bowel sounds are normal. He exhibits no distension. There is no tenderness.  Musculoskeletal: Normal range of motion. He exhibits no edema.  Neurological: He is alert and oriented to person, place, and time. He has normal strength. Coordination normal. GCS eye subscore is 4. GCS verbal subscore is 5. GCS motor subscore is 6.  Skin: Skin is warm, dry and intact.  Psychiatric: He has a normal mood and affect.    ED Course  Procedures (including critical care time) Labs Review Labs Reviewed  BASIC METABOLIC PANEL - Abnormal;  Notable for the following:    Sodium 131 (*)    Chloride 95 (*)    Glucose, Bld 382 (*)    All other components within normal limits  CBC - Abnormal; Notable for the following:    WBC 10.7 (*)    All other components within normal limits  I-STAT TROPOININ, ED    Imaging Review Dg Chest 2 View  04/29/2015   CLINICAL DATA:  Shortness of breath, chest and back pain for 3 days.  EXAM: CHEST  2 VIEW  COMPARISON:  December 04, 2011  FINDINGS: The heart size and mediastinal contours are within normal limits. There is no focal infiltrate, pulmonary edema, or pleural effusion. Spinal rods are unchanged. There is scoliosis of spine.  IMPRESSION: No active cardiopulmonary disease.   Electronically Signed   By: Sherian ReinWei-Chen  Lin M.D.   On: 04/29/2015 18:51     EKG Interpretation None      MDM   Final diagnoses:  None   Patient care to be assumed by Dr. Preston FleetingGlick to follow up CT chest abdomen.     Arby BarretteMarcy Norman Bier, MD 05/06/15 [phone]

## 2015-04-29 NOTE — ED Notes (Signed)
Pt reports chest and abdominal pain since Saturday. Denies SOB. NAD noted

## 2015-04-30 ENCOUNTER — Emergency Department (HOSPITAL_COMMUNITY): Payer: Medicaid Other

## 2015-04-30 ENCOUNTER — Encounter (HOSPITAL_COMMUNITY): Payer: Self-pay | Admitting: Emergency Medicine

## 2015-04-30 DIAGNOSIS — R1013 Epigastric pain: Secondary | ICD-10-CM | POA: Diagnosis present

## 2015-04-30 DIAGNOSIS — K819 Cholecystitis, unspecified: Secondary | ICD-10-CM | POA: Diagnosis present

## 2015-04-30 DIAGNOSIS — K838 Other specified diseases of biliary tract: Secondary | ICD-10-CM | POA: Diagnosis not present

## 2015-04-30 DIAGNOSIS — Z993 Dependence on wheelchair: Secondary | ICD-10-CM | POA: Diagnosis not present

## 2015-04-30 DIAGNOSIS — A419 Sepsis, unspecified organism: Secondary | ICD-10-CM | POA: Diagnosis present

## 2015-04-30 DIAGNOSIS — I471 Supraventricular tachycardia: Secondary | ICD-10-CM | POA: Diagnosis not present

## 2015-04-30 DIAGNOSIS — N319 Neuromuscular dysfunction of bladder, unspecified: Secondary | ICD-10-CM | POA: Diagnosis present

## 2015-04-30 DIAGNOSIS — E785 Hyperlipidemia, unspecified: Secondary | ICD-10-CM | POA: Diagnosis present

## 2015-04-30 DIAGNOSIS — K8067 Calculus of gallbladder and bile duct with acute and chronic cholecystitis with obstruction: Secondary | ICD-10-CM | POA: Diagnosis not present

## 2015-04-30 DIAGNOSIS — E876 Hypokalemia: Secondary | ICD-10-CM | POA: Diagnosis not present

## 2015-04-30 DIAGNOSIS — I10 Essential (primary) hypertension: Secondary | ICD-10-CM | POA: Diagnosis present

## 2015-04-30 DIAGNOSIS — K831 Obstruction of bile duct: Secondary | ICD-10-CM | POA: Diagnosis present

## 2015-04-30 DIAGNOSIS — E1165 Type 2 diabetes mellitus with hyperglycemia: Secondary | ICD-10-CM | POA: Diagnosis not present

## 2015-04-30 DIAGNOSIS — G8220 Paraplegia, unspecified: Secondary | ICD-10-CM | POA: Diagnosis not present

## 2015-04-30 DIAGNOSIS — E871 Hypo-osmolality and hyponatremia: Secondary | ICD-10-CM | POA: Diagnosis present

## 2015-04-30 DIAGNOSIS — E669 Obesity, unspecified: Secondary | ICD-10-CM | POA: Diagnosis present

## 2015-04-30 DIAGNOSIS — E861 Hypovolemia: Secondary | ICD-10-CM | POA: Diagnosis not present

## 2015-04-30 LAB — HEPATIC FUNCTION PANEL
ALBUMIN: 3.8 g/dL (ref 3.5–5.0)
ALBUMIN: 3.8 g/dL (ref 3.5–5.0)
ALK PHOS: 273 U/L — AB (ref 38–126)
ALT: 534 U/L — ABNORMAL HIGH (ref 17–63)
ALT: 555 U/L — ABNORMAL HIGH (ref 17–63)
AST: 677 U/L — AB (ref 15–41)
AST: 584 U/L — AB (ref 15–41)
Alkaline Phosphatase: 248 U/L — ABNORMAL HIGH (ref 38–126)
BILIRUBIN INDIRECT: 2.4 mg/dL — AB (ref 0.3–0.9)
BILIRUBIN TOTAL: 6.6 mg/dL — AB (ref 0.3–1.2)
Bilirubin, Direct: 4 mg/dL — ABNORMAL HIGH (ref 0.1–0.5)
Bilirubin, Direct: 4.7 mg/dL — ABNORMAL HIGH (ref 0.1–0.5)
Indirect Bilirubin: 2.6 mg/dL — ABNORMAL HIGH (ref 0.3–0.9)
TOTAL PROTEIN: 7.9 g/dL (ref 6.5–8.1)
Total Bilirubin: 7.1 mg/dL — ABNORMAL HIGH (ref 0.3–1.2)
Total Protein: 7.9 g/dL (ref 6.5–8.1)

## 2015-04-30 LAB — URINALYSIS, ROUTINE W REFLEX MICROSCOPIC
Glucose, UA: >1000 mg/dL — AB
Ketones, ur: >80 mg/dL — AB
NITRITE: POSITIVE — AB
PH: 5.5 (ref 5.0–8.0)
Protein, ur: 100 mg/dL — AB
Specific Gravity, Urine: 1.031 — ABNORMAL HIGH (ref 1.005–1.030)
UROBILINOGEN UA: 1 mg/dL (ref 0.0–1.0)

## 2015-04-30 LAB — MRSA PCR SCREENING: MRSA BY PCR: NEGATIVE

## 2015-04-30 LAB — GLUCOSE, CAPILLARY
GLUCOSE-CAPILLARY: 166 mg/dL — AB (ref 65–99)
Glucose-Capillary: 224 mg/dL — ABNORMAL HIGH (ref 65–99)
Glucose-Capillary: 180 mg/dL — ABNORMAL HIGH (ref 65–99)

## 2015-04-30 LAB — URINE MICROSCOPIC-ADD ON

## 2015-04-30 LAB — LACTIC ACID, PLASMA: LACTIC ACID, VENOUS: 1.1 mmol/L (ref 0.5–2.0)

## 2015-04-30 LAB — LIPASE, BLOOD
LIPASE: 22 U/L (ref 22–51)
Lipase: 20 U/L — ABNORMAL LOW (ref 22–51)

## 2015-04-30 LAB — PROCALCITONIN: PROCALCITONIN: 0.26 ng/mL

## 2015-04-30 MED ORDER — HYDROMORPHONE HCL 1 MG/ML IJ SOLN
1.0000 mg | Freq: Once | INTRAMUSCULAR | Status: AC
Start: 2015-04-30 — End: 2015-04-30
  Administered 2015-04-30: 1 mg via INTRAVENOUS
  Filled 2015-04-30: qty 1

## 2015-04-30 MED ORDER — ONDANSETRON HCL 4 MG PO TABS: 4.0000 mg | ORAL_TABLET | Freq: Four times a day (QID) | ORAL | Status: DC | PRN

## 2015-04-30 MED ORDER — HYDROMORPHONE HCL 1 MG/ML IJ SOLN
1.0000 mg | Freq: Once | INTRAMUSCULAR | Status: AC
  Administered 2015-04-30: 1 mg via INTRAVENOUS
  Filled 2015-04-30: qty 1

## 2015-04-30 MED ORDER — HYDRALAZINE HCL 20 MG/ML IJ SOLN
10.0000 mg | Freq: Four times a day (QID) | INTRAMUSCULAR | Status: DC | PRN
Start: 2015-04-30 — End: 2015-05-01
  Filled 2015-04-30: qty 1

## 2015-04-30 MED ORDER — PIPERACILLIN-TAZOBACTAM 3.375 G IVPB 30 MIN
3.3750 g | Freq: Once | INTRAVENOUS | Status: AC
  Administered 2015-04-30: 3.375 g via INTRAVENOUS
  Filled 2015-04-30: qty 50

## 2015-04-30 MED ORDER — INSULIN ASPART 100 UNIT/ML ~~LOC~~ SOLN
0.0000 [IU] | Freq: Three times a day (TID) | SUBCUTANEOUS | Status: DC
  Administered 2015-04-30: 2 [IU] via SUBCUTANEOUS
  Administered 2015-04-30: 3 [IU] via SUBCUTANEOUS
  Administered 2015-05-01: 5 [IU] via SUBCUTANEOUS
  Administered 2015-05-01: 2 [IU] via SUBCUTANEOUS
  Administered 2015-05-02: 3 [IU] via SUBCUTANEOUS
  Administered 2015-05-02 (×2): 2 [IU] via SUBCUTANEOUS
  Administered 2015-05-03 (×2): 3 [IU] via SUBCUTANEOUS
  Administered 2015-05-03: 2 [IU] via SUBCUTANEOUS

## 2015-04-30 MED ORDER — HYDROMORPHONE HCL 1 MG/ML IJ SOLN
1.0000 mg | INTRAMUSCULAR | Status: AC | PRN
  Administered 2015-04-30 (×2): 1 mg via INTRAVENOUS
  Filled 2015-04-30 (×2): qty 1

## 2015-04-30 MED ORDER — MORPHINE SULFATE 2 MG/ML IJ SOLN
1.0000 mg | INTRAMUSCULAR | Status: DC | PRN
  Administered 2015-04-30 – 2015-05-01 (×3): 1 mg via INTRAVENOUS
  Filled 2015-04-30 (×3): qty 1

## 2015-04-30 MED ORDER — HYDROCODONE-ACETAMINOPHEN 5-325 MG PO TABS
1.0000 | ORAL_TABLET | ORAL | Status: DC | PRN
  Administered 2015-04-30: 2 via ORAL
  Filled 2015-04-30: qty 2

## 2015-04-30 MED ORDER — SODIUM CHLORIDE 0.9 % IV BOLUS (SEPSIS)
2000.0000 mL | Freq: Once | INTRAVENOUS | Status: AC
  Administered 2015-04-30: 2000 mL via INTRAVENOUS

## 2015-04-30 MED ORDER — ONDANSETRON HCL 4 MG/2ML IJ SOLN: 4.0000 mg | Freq: Three times a day (TID) | INTRAMUSCULAR | Status: DC | PRN

## 2015-04-30 MED ORDER — SODIUM CHLORIDE 0.9 % IV SOLN
INTRAVENOUS | Status: DC
  Administered 2015-04-30 – 2015-05-01 (×2): via INTRAVENOUS
  Filled 2015-04-30: qty 1000

## 2015-04-30 MED ORDER — PANTOPRAZOLE SODIUM 40 MG IV SOLR: 40.0000 mg | Freq: Two times a day (BID) | INTRAVENOUS | Status: DC

## 2015-04-30 MED ORDER — ENOXAPARIN SODIUM 40 MG/0.4ML ~~LOC~~ SOLN
40.0000 mg | SUBCUTANEOUS | Status: DC
  Administered 2015-04-30 – 2015-05-06 (×6): 40 mg via SUBCUTANEOUS
  Filled 2015-04-30 (×6): qty 0.4

## 2015-04-30 MED ORDER — GI COCKTAIL ~~LOC~~
30.0000 mL | Freq: Once | ORAL | Status: AC
Start: 2015-04-30 — End: 2015-04-30
  Administered 2015-04-30: 30 mL via ORAL
  Filled 2015-04-30: qty 30

## 2015-04-30 MED ORDER — HYDROCODONE-ACETAMINOPHEN 5-325 MG PO TABS: 1.0000 | ORAL_TABLET | ORAL | Status: DC | PRN

## 2015-04-30 MED ORDER — ACETAMINOPHEN 650 MG RE SUPP: 650.0000 mg | Freq: Four times a day (QID) | RECTAL | Status: DC | PRN

## 2015-04-30 MED ORDER — CYCLOBENZAPRINE HCL 10 MG PO TABS: 10.0000 mg | ORAL_TABLET | Freq: Three times a day (TID) | ORAL | Status: DC | PRN

## 2015-04-30 MED ORDER — ACETAMINOPHEN 325 MG PO TABS
650.0000 mg | ORAL_TABLET | Freq: Four times a day (QID) | ORAL | Status: DC | PRN
  Administered 2015-04-30 – 2015-05-01 (×2): 650 mg via ORAL
  Filled 2015-04-30 (×2): qty 2

## 2015-04-30 MED ORDER — PIPERACILLIN-TAZOBACTAM 3.375 G IVPB
3.3750 g | Freq: Three times a day (TID) | INTRAVENOUS | Status: DC
  Administered 2015-04-30 – 2015-05-06 (×17): 3.375 g via INTRAVENOUS
  Filled 2015-04-30 (×22): qty 50

## 2015-04-30 MED ORDER — ONDANSETRON HCL 4 MG/2ML IJ SOLN: 4.0000 mg | Freq: Four times a day (QID) | INTRAMUSCULAR | Status: DC | PRN

## 2015-04-30 MED ORDER — PANTOPRAZOLE SODIUM 20 MG PO TBEC: 20.0000 mg | DELAYED_RELEASE_TABLET | Freq: Every day | ORAL | Status: DC

## 2015-04-30 NOTE — Progress Notes (Signed)
Patient ID: John Meza, male   DOB: 09-06-1961, 54 y.o.   MRN: [id number] GI consult noted. Plan lap chole/IOC tomorrow. Procedure, risks, and benefits [place] He is agreeable. Violeta GelinasBurke Elzada Pytel, MD, MPH, FACS Trauma: [phone] General Surgery: [phone]

## 2015-04-30 NOTE — H&P (Signed)
[facility] History and Physical  John Meza WUJ:[id number] DOB: [birth year] DOA: 04/29/2015  Referring physician: Dr Preston FleetingGlick.  PCP: No PCP Per Patient   Chief Complaint: Abdominal pain.   HPI: John Meza is a 54 y.o. male with PMH significant for HTN, Diabetes, who presents complaining of abdominal pain, that started 4 days prior to admission. He relates epigastric, sharp abdominal pain, constant, worse after melas. Denies vomiting, diarrhea, dyspnea. He drinks alcohol socially.   Evaluation in the ED: Alkaline phosphatase 248, AST 677, ALT 534, bili 6.6, Abdominal US; sign of cholecystitis, CT angio chest abdomen: no PE, no dissection, gallbladder with evidence of cholecystitis.   Review of Systems:  Negative except as per HPI  Past Medical History  Diagnosis Date  . Diabetes mellitus   . Hypertension   . Spine fracture 11/16/2011    T 11- T9-L1  . SCI (spinal cord injury)    Past Surgical History  Procedure Laterality Date  . Spine surgery     Social History:  reports that he has never smoked. He has never used smokeless tobacco. He reports that he drinks about 0.6 oz of alcohol per week. He reports that he does not use illicit drugs.  No Known Allergies  Family History  Problem Relation Age of Onset  . Diabetes Father     Prior to Admission medications   Medication Sig Start Date End Date Taking? Authorizing Provider  cyclobenzaprine (FLEXERIL) 10 MG tablet Take 10 mg by mouth 3 (three) times daily as needed for muscle spasms.   Yes Historical Provider, MD  dexlansoprazole (DEXILANT) 60 MG capsule Take 60 mg by mouth daily.   Yes Historical Provider, MD  lisinopril (PRINIVIL,ZESTRIL) 5 MG tablet Take 5 mg by mouth daily.   Yes Historical Provider, MD  lovastatin (MEVACOR) 10 MG tablet Take 10 mg by mouth at bedtime.   Yes Historical Provider, MD  metFORMIN (GLUCOPHAGE) 850 MG tablet Take 850 mg by mouth 3 (three) times daily.   Yes Historical  Provider, MD  metroNIDAZOLE (FLAGYL) 500 MG tablet Take 500 mg by mouth 2 (two) times daily.   Yes Historical Provider, MD  ondansetron (ZOFRAN) 8 MG tablet Take 8 mg by mouth every 8 (eight) hours as needed for nausea or vomiting.   Yes Historical Provider, MD  pantoprazole (PROTONIX) 40 MG tablet TAKE ONE TABLET BY MOUTH ONCE DAILY 09/12/14  Yes Ranelle OysterZachary T Swartz, MD  bisacodyl (DULCOLAX) 10 MG suppository Place 10 mg rectally as needed.    Historical Provider, MD  ciprofloxacin (CIPRO) 250 MG tablet Take 1 tablet (250 mg total) by mouth 2 (two) times daily. Patient not taking: Reported on 04/29/2015 04/12/13   Ranelle OysterZachary T Swartz, MD  HYDROcodone-acetaminophen (NORCO/VICODIN) 5-325 MG per tablet Take 1-2 tablets by mouth every 4 (four) hours as needed for moderate pain or severe pain. 04/30/15   Arby BarretteMarcy Pfeiffer, MD  pantoprazole (PROTONIX) 20 MG tablet Take 1 tablet (20 mg total) by mouth daily. 04/30/15   Arby BarretteMarcy Pfeiffer, MD   Physical Exam: Filed Vitals:   04/30/15 0545 04/30/15 0600 04/30/15 0700 04/30/15 0732  BP: 131/95 175/102 184/106   Pulse: 119 120 122   Temp:    100.4 F (38 C)  TempSrc:      Resp:      Height:      Weight:      SpO2: 92% 96% 93%     Wt Readings from Last 3 Encounters:  04/29/15 86.183 kg (190 lb)  04/04/13 86.183  kg (190 lb)  10/10/12 86.183 kg (190 lb)    General:  Appears in pain  , no distress.  Eyes: PERRL, normal lids, irises & conjunctiva ENT: grossly normal hearing, lips & tongue Neck: no LAD, masses or thyromegaly Cardiovascular: RRR, no m/r/g. No LE edema. Telemetry: tachycardia Respiratory: CTA bilaterally, no w/r/r. Normal respiratory effort. Abdomen: soft, epigastric and RUQ tenderness. Skin: no rash or induration seen on limited exam Musculoskeletal: grossly normal tone BUE/BLE Psychiatric: grossly normal mood and affect, speech fluent and appropriate Neurologic: grossly non-focal.          Labs on Admission:  Basic Metabolic  Panel:  Recent Labs Lab 04/29/15 1826  NA 131*  K 3.9  CL 95*  CO2 23  GLUCOSE 382*  BUN 10  CREATININE 0.74  CALCIUM 9.4   Liver Function Tests:  Recent Labs Lab 04/30/15 0420  AST 677*  ALT 534*  ALKPHOS 248*  BILITOT 6.6*  PROT 7.9  ALBUMIN 3.8    Recent Labs Lab 04/30/15 0420  LIPASE 20*   No results for input(s): AMMONIA in the last 168 hours. CBC:  Recent Labs Lab 04/29/15 1826  WBC 10.7*  HGB 14.9  HCT 42.7  MCV 85.1  PLT 190   Cardiac Enzymes: No results for input(s): CKTOTAL, CKMB, CKMBINDEX, TROPONINI in the last 168 hours.  BNP (last 3 results) No results for input(s): BNP in the last 8760 hours.  ProBNP (last 3 results) No results for input(s): PROBNP in the last 8760 hours.  CBG: No results for input(s): GLUCAP in the last 168 hours.  Radiological Exams on Admission: Dg Chest 2 View  04/29/2015   CLINICAL DATA:  Shortness of breath, chest and back pain for 3 days.  EXAM: CHEST  2 VIEW  COMPARISON:  December 04, 2011  FINDINGS: The heart size and mediastinal contours are within normal limits. There is no focal infiltrate, pulmonary edema, or pleural effusion. Spinal rods are unchanged. There is scoliosis of spine.  IMPRESSION: No active cardiopulmonary disease.   Electronically Signed   By: Sherian Rein M.D.   On: 04/29/2015 18:51   US Abdomen Complete  04/30/2015   CLINICAL DATA:  Chest and abdominal pain for 2 days. History of diabetes, hypertension.  EXAM: ULTRASOUND ABDOMEN COMPLETE  COMPARISON:  CT chest, abdomen and pelvis April 29, 2015  FINDINGS: Gallbladder: Echogenic layering gallbladder sludge in addition to punctate echogenic gallstones. Gallbladder wall thickening at 8 mm. No sonographic Murphy's sign elicited.  Common bile duct: Diameter: 4 mm  Liver: Echogenic heterogeneous liver without intrahepatic biliary dilatation. Hepatopetal portal vein. Hepatopetal portal vein.  IVC: No abnormality visualized.  Pancreas: Visualized  portion unremarkable.  Spleen: Size and appearance within normal limits.  Right Kidney: Length: 12.1 cm. Echogenicity within normal limits. No mass or hydronephrosis visualized.  Left Kidney: Length: 12 cm. Echogenicity within normal limits. No mass or hydronephrosis visualized.  Abdominal aorta: No aneurysm visualized.  Other findings: None.  IMPRESSION: Sonographic findings of cholecystitis though, no sonographic Murphy's sign elicited.  Heterogeneous echogenic liver can be seen with hepatocellular disease/steatosis.   Electronically Signed   By: Awilda Metro M.D.   On: 04/30/2015 03:44   Ct Angio Chest Aorta W/cm &/or Wo/cm  04/30/2015   CLINICAL DATA:  Acute onset of generalized chest and abdominal pain. Initial encounter.  EXAM: CT ANGIOGRAPHY CHEST, ABDOMEN AND PELVIS  TECHNIQUE: Multidetector CT imaging through the chest, abdomen and pelvis was performed using the standard protocol during bolus administration of  intravenous contrast. Multiplanar reconstructed images and MIPs were obtained and reviewed to evaluate the vascular anatomy.  CONTRAST:  OMNIPAQUE IOHEXOL 350 MG/ML SOLN  COMPARISON:  CTA of the chest performed 11/17/2011, and CTA of the abdomen and pelvis from 11/16/2011  FINDINGS: CTA CHEST FINDINGS  There is no evidence of aortic dissection. There is no evidence of aneurysmal dilatation. No significant calcific atherosclerotic disease is noted along the thoracic aorta.  There is no evidence of pulmonary embolus.  Mild bibasilar atelectasis is noted. The lungs are otherwise clear. There is no evidence of pleural effusion or pneumothorax. No masses are identified; no abnormal focal contrast enhancement is seen.  The mediastinum is unremarkable in appearance. No mediastinal lymphadenopathy is seen. No pericardial effusion is identified. The great vessels are grossly unremarkable in appearance. No axillary lymphadenopathy is seen. The thyroid gland is unremarkable in appearance.  No acute  osseous abnormalities are seen.  Review of the MIP images confirms the above findings.  CTA ABDOMEN AND PELVIS FINDINGS  There is no evidence of aortic dissection. There is no evidence of aneurysmal dilatation. No calcific atherosclerotic disease is seen. The celiac trunk, superior mesenteric artery, bilateral renal arteries and inferior mesenteric artery are unremarkable in appearance. The inferior vena cava is within normal limits.  Mild soft tissue inflammation is noted about the gallbladder, concerning for mild cholecystitis. Would correlate for associated symptoms.  Heterogeneity of the liver may reflect underlying venous congestion. The spleen is unremarkable in appearance. The pancreas and adrenal glands are unremarkable.  Mild nonspecific perinephric stranding is noted bilaterally. The kidneys are otherwise unremarkable. There is no evidence of hydronephrosis. No renal or ureteral stones are seen.  No free fluid is identified. The small bowel is unremarkable in appearance. The stomach is within normal limits. No acute vascular abnormalities are seen.  The appendix is normal caliber, without evidence of appendicitis. The colon is unremarkable in appearance.  The bladder is moderately distended and grossly unremarkable. Mild cystic change is noted at the right side of the prostate. This is somewhat more prominent than in 2013. The prostate remains normal in size. No inguinal lymphadenopathy is seen. A small left inguinal hernia is noted, containing only fat.  No acute osseous abnormalities are identified. There is mild chronic compression deformity at T11, with underlying lumbar spinal fusion at T9-L1.  Review of the MIP images confirms the above findings.  IMPRESSION: 1. No evidence of aortic dissection. No evidence of aneurysmal dilatation. No calcific atherosclerotic disease seen. 2. No evidence of pulmonary embolus. 3. Mild soft tissue inflammation about the gallbladder, raising concern for mild  cholecystitis. Would correlate for associated symptoms. 4. Heterogeneity of the liver may reflect underlying venous congestion. 5. Mild bibasilar atelectasis noted; lungs otherwise clear. 6. Mild cystic change at the right side of the prostate. This is somewhat more prominent than in 2013. Would correlate with PSA. 7. Small left inguinal hernia, containing only fat. 8. Mild chronic compression deformity at T11, with underlying postoperative change.   Electronically Signed   By: Roanna Raider M.D.   On: 04/30/2015 01:03   Ct Angio Abd/pel W/ And/or W/o  04/30/2015   CLINICAL DATA:  Acute onset of generalized chest and abdominal pain. Initial encounter.  EXAM: CT ANGIOGRAPHY CHEST, ABDOMEN AND PELVIS  TECHNIQUE: Multidetector CT imaging through the chest, abdomen and pelvis was performed using the standard protocol during bolus administration of intravenous contrast. Multiplanar reconstructed images and MIPs were obtained and reviewed to evaluate the vascular anatomy.  CONTRAST:  OMNIPAQUE IOHEXOL 350 MG/ML SOLN  COMPARISON:  CTA of the chest performed 11/17/2011, and CTA of the abdomen and pelvis from 11/16/2011  FINDINGS: CTA CHEST FINDINGS  There is no evidence of aortic dissection. There is no evidence of aneurysmal dilatation. No significant calcific atherosclerotic disease is noted along the thoracic aorta.  There is no evidence of pulmonary embolus.  Mild bibasilar atelectasis is noted. The lungs are otherwise clear. There is no evidence of pleural effusion or pneumothorax. No masses are identified; no abnormal focal contrast enhancement is seen.  The mediastinum is unremarkable in appearance. No mediastinal lymphadenopathy is seen. No pericardial effusion is identified. The great vessels are grossly unremarkable in appearance. No axillary lymphadenopathy is seen. The thyroid gland is unremarkable in appearance.  No acute osseous abnormalities are seen.  Review of the MIP images confirms the above  findings.  CTA ABDOMEN AND PELVIS FINDINGS  There is no evidence of aortic dissection. There is no evidence of aneurysmal dilatation. No calcific atherosclerotic disease is seen. The celiac trunk, superior mesenteric artery, bilateral renal arteries and inferior mesenteric artery are unremarkable in appearance. The inferior vena cava is within normal limits.  Mild soft tissue inflammation is noted about the gallbladder, concerning for mild cholecystitis. Would correlate for associated symptoms.  Heterogeneity of the liver may reflect underlying venous congestion. The spleen is unremarkable in appearance. The pancreas and adrenal glands are unremarkable.  Mild nonspecific perinephric stranding is noted bilaterally. The kidneys are otherwise unremarkable. There is no evidence of hydronephrosis. No renal or ureteral stones are seen.  No free fluid is identified. The small bowel is unremarkable in appearance. The stomach is within normal limits. No acute vascular abnormalities are seen.  The appendix is normal caliber, without evidence of appendicitis. The colon is unremarkable in appearance.  The bladder is moderately distended and grossly unremarkable. Mild cystic change is noted at the right side of the prostate. This is somewhat more prominent than in 2013. The prostate remains normal in size. No inguinal lymphadenopathy is seen. A small left inguinal hernia is noted, containing only fat.  No acute osseous abnormalities are identified. There is mild chronic compression deformity at T11, with underlying lumbar spinal fusion at T9-L1.  Review of the MIP images confirms the above findings.  IMPRESSION: 1. No evidence of aortic dissection. No evidence of aneurysmal dilatation. No calcific atherosclerotic disease seen. 2. No evidence of pulmonary embolus. 3. Mild soft tissue inflammation about the gallbladder, raising concern for mild cholecystitis. Would correlate for associated symptoms. 4. Heterogeneity of the liver  may reflect underlying venous congestion. 5. Mild bibasilar atelectasis noted; lungs otherwise clear. 6. Mild cystic change at the right side of the prostate. This is somewhat more prominent than in 2013. Would correlate with PSA. 7. Small left inguinal hernia, containing only fat. 8. Mild chronic compression deformity at T11, with underlying postoperative change.   Electronically Signed   By: Roanna Raider M.D.   On: 04/30/2015 01:03    EKG: Independently reviewed. Sinus tachycardia  Assessment/Plan Active Problems:   Obstructive jaundice   Sepsis   Cholecystitis  1-Cholecystitis, Obstructive Jaundice:  Admit to step down unit.  NPO, IV fluids.  IV Zosyn.  Surgery consulted.  GI consulted for further evaluation, MRCP vs ERCP.   2-Sepsis;  In setting of cholecystitis. Febrile, tachycardia, mild leukocytosis.  Admit to step down unit.  IV bolus 2 L.  IV fluids.  IV antibiotics.  Blood culture, lactic acid ordered.  3-Diabetes; hold metformin. SSI.   4-Hyponatremia; Suspect related to hypovolemia; Continue with IV fluids.   5-HTN; PRN IV hydralazine. Hold lisinopril.    Code Status: Full Code.  DVT Prophylaxis:Lovenox.  Family Communication: care discussed with family Disposition Plan: expect 3 to 4 days inpatient.   Time spent: 75 minutes.   Hartley Barefoot A [facility] Pager [phone]

## 2015-04-30 NOTE — ED Notes (Signed)
Pt incontinent of urine, linen and depends changed.

## 2015-04-30 NOTE — Progress Notes (Signed)
ANTIBIOTIC CONSULT NOTE - INITIAL  Pharmacy Consult for Zosyn Indication: intra-abdominal infection  No Known Allergies  53yo male c/o chest and abdominal pain since Saturday, CT concerning for cholecystitis, US confirmed, to begin IV ABX.  Will start Zosyn 3.375g IV Q8H for CrCl >100 ml/min and monitor CBC and Cx.  Vernard GamblesVeronda Rheagan Nayak, PharmD, BCPS  04/30/2015,7:41 AM

## 2015-04-30 NOTE — Care Management Note (Signed)
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 Dtr, Pieter PartridgeMamairani Garcia [phone] in room to translate. Pt goes [facility] at [address], [place]# [phone] sees Barry BrunnerMalia Lonergan PA at the clinic. He pays a rate of $70 for vist, and [facility] Arvin Collardeceives Toujeo at $15 from clinic. Wife states she stays at home with him to care for him.   Expected Discharge Date:                  Expected Discharge Plan:  Home/Self Care  In-House Referral:     Discharge planning Services  CM Consult  Post Acute Care Choice:    Choice offered to:     DME Arranged:    DME Agency:     HH Arranged:    HH Agency:     Status of Service:  In process, will continue to follow  Medicare Important Message Given:    Date Medicare IM Given:    Medicare IM give by:    Date Additional Medicare IM Given:    Additional Medicare Important Message give by:     If discussed at Long Length of Stay Meetings, dates discussed:    Additional Comments:  Elliot CousinShavis, Remer Couse Ellen, RN 04/30/2015, 4:25 PM

## 2015-04-30 NOTE — Consult Note (Signed)
[employer] [birth year]  [id number].   Requesting MD: Dr. 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  He otherwise has DM and HTN.  On Saturday he began having some high epigastric abdominal pain and chest pain.  He denies any nausea, vomiting, diarrhea, fevers, or chills.  His pain continued to worsen and he finally decided to come to the [facility] last night for evaluation.  He has eaten very little as this does make his pain slightly worse.  Upon arrival to the ED, his TB was noted to be 6.6 with a direct bili of 4.0.  His other LFTs are significantly elevated as well.  Today his TB is 7.1 and direct bili up to 4.7.  His CT abd shows some mild soft tissue inflammation around his gallbladder.  His US shows gallbladder wall thickening of 76m and a normal CBD of 428m  GI has been consulted as well as general surgery for further evaluation.  ROS : Please see HPI, otherwise negative except for neurogenic bladder for which he self-caths due to paraplegia  Family History  Problem Relation Age of Onset  . Diabetes Father     Past Medical History  Diagnosis Date  . Diabetes mellitus   . Hypertension   . Spine fracture 11/16/2011    T 11- T9-L1  . SCI (spinal cord injury)     Past Surgical History  Procedure Laterality Date  . Spine surgery      Social History:  reports that he has never smoked. He has never used smokeless tobacco. He reports that he drinks about 0.6 oz of alcohol per week. He reports that he does not use illicit drugs.  Allergies: No Known Allergies  Medications Prior to Admission  Medication Sig Dispense Refill  . cyclobenzaprine (FLEXERIL) 10 MG tablet Take 10 mg by mouth 3 (three) times daily as needed for muscle spasms.    . Marland Kitchenexlansoprazole (DEXILANT) 60 MG capsule Take 60 mg by mouth daily.    . Marland Kitchenisinopril (PRINIVIL,ZESTRIL) 5  MG tablet Take 5 mg by mouth daily.    . Marland Kitchenovastatin (MEVACOR) 10 MG tablet Take 10 mg by mouth at bedtime.    . metFORMIN (GLUCOPHAGE) 850 MG tablet Take 850 mg by mouth 3 (three) times daily.    . metroNIDAZOLE (FLAGYL) 500 MG tablet Take 500 mg by mouth 2 (two) times daily.    . ondansetron (ZOFRAN) 8 MG tablet Take 8 mg by mouth every 8 (eight) hours as needed for nausea or vomiting.    . pantoprazole (PROTONIX) 40 MG tablet TAKE ONE TABLET BY MOUTH ONCE DAILY 30 tablet 0  . bisacodyl (DULCOLAX) 10 MG suppository Place 10 mg rectally as needed.    . ciprofloxacin (CIPRO) 250 MG tablet Take 1 tablet (250 mg total) by mouth 2 (two) times daily. (Patient not taking: Reported on 04/29/2015) 10 tablet 0    Blood pressure 161/88, pulse 118, temperature 100.4 F (38 C), temperature source Oral, resp. rate 22, height '5\' 7"'  (1.702 m), weight 86.183 kg (190 lb), SpO2 98 %. Physical Exam: General: pleasant, WD, WN Hispanic male who is laying in bed in NAD HEENT: head is normocephalic, atraumatic.  Sclera are icteric.  PERRL.  Ears and nose without any masses or lesions.  Mouth is pink and moist, but dental appliance noted on upper teeth Heart: regular rhythm, but tachycardic.  Normal s1,s2. No obvious murmurs, gallops, or rubs noted.  Palpable radial and pedal pulses bilaterally Lungs: CTAB, no wheezes, rhonchi, or rales noted.  Respiratory effort nonlabored Abd: soft, tender high in epigastrium, otherwise minimally tender, ND, +BS, no masses, hernias, or organomegaly MS: all 4 extremities are symmetrical with no cyanosis, clubbing, or edema. LE are flaccid  Skin: warm and dry with no masses, lesions, or rashes Psych: A&Ox3 with an appropriate affect.    Results for orders placed or performed during the hospital encounter of 04/29/15 (from the past 48 hour(s))  Basic metabolic panel     Status: Abnormal   Collection Time: 04/29/15  6:26 PM  Result Value Ref Range   Sodium 131 (L) 135 - 145 mmol/L    Potassium 3.9 3.5 - 5.1 mmol/L   Chloride 95 (L) 101 - 111 mmol/L   CO2 23 22 - 32 mmol/L   Glucose, Bld 382 (H) 65 - 99 mg/dL   BUN 10 6 - 20 mg/dL   Creatinine, Ser 0.74 0.61 - 1.24 mg/dL   Calcium 9.4 8.9 - 10.3 mg/dL   GFR calc non Af Amer >60 >60 mL/min   GFR calc Af Amer >60 >60 mL/min    Comment: (NOTE) The eGFR has been calculated using the CKD EPI equation. This calculation has not been validated in all clinical situations. eGFR's persistently <60 mL/min signify possible Chronic Kidney Disease.    Anion gap 13 5 - 15  CBC     Status: Abnormal   Collection Time: 04/29/15  6:26 PM  Result Value Ref Range   WBC 10.7 (H) 4.0 - 10.5 K/uL   RBC 5.02 4.22 - 5.81 MIL/uL   Hemoglobin 14.9 13.0 - 17.0 g/dL   HCT 42.7 39.0 - 52.0 %   MCV 85.1 78.0 - 100.0 fL   MCH 29.7 26.0 - 34.0 pg   MCHC 34.9 30.0 - 36.0 g/dL   RDW 13.7 11.5 - 15.5 %   Platelets 190 150 - 400 K/uL  I-stat troponin, ED     Status: None   Collection Time: 04/29/15  6:33 PM  Result Value Ref Range   Troponin i, poc 0.00 0.00 - 0.08 ng/mL   Comment 3            Comment: Due to the release kinetics of cTnI, a negative result within the first hours of the onset of symptoms does not rule out myocardial infarction with certainty. If myocardial infarction is still suspected, repeat the test at appropriate intervals.   Hepatic function panel     Status: Abnormal   Collection Time: 04/30/15  4:20 AM  Result Value Ref Range   Total Protein 7.9 6.5 - 8.1 g/dL   Albumin 3.8 3.5 - 5.0 g/dL   AST 677 (H) 15 - 41 U/L   ALT 534 (H) 17 - 63 U/L   Alkaline Phosphatase 248 (H) 38 - 126 U/L   Total Bilirubin 6.6 (H) 0.3 - 1.2 mg/dL   Bilirubin, Direct 4.0 (H) 0.1 - 0.5 mg/dL   Indirect Bilirubin 2.6 (H) 0.3 - 0.9 mg/dL  Lipase, blood     Status: Abnormal   Collection Time: 04/30/15  4:20 AM  Result Value Ref Range   Lipase 20 (L) 22 - 51 U/L  Lipase, blood     Status: None   Collection Time: 04/30/15  7:50 AM   Result Value Ref Range   Lipase 22 22 - 51 U/L  Hepatic function panel  Status: Abnormal   Collection Time: 04/30/15  7:50 AM  Result Value Ref Range   Total Protein 7.9 6.5 - 8.1 g/dL   Albumin 3.8 3.5 - 5.0 g/dL   AST 584 (H) 15 - 41 U/L   ALT 555 (H) 17 - 63 U/L   Alkaline Phosphatase 273 (H) 38 - 126 U/L   Total Bilirubin 7.1 (H) 0.3 - 1.2 mg/dL   Bilirubin, Direct 4.7 (H) 0.1 - 0.5 mg/dL   Indirect Bilirubin 2.4 (H) 0.3 - 0.9 mg/dL  Lactic acid, plasma     Status: None   Collection Time: 04/30/15  7:50 AM  Result Value Ref Range   Lactic Acid, Venous 1.1 0.5 - 2.0 mmol/L  Procalcitonin - Baseline     Status: None   Collection Time: 04/30/15  7:50 AM  Result Value Ref Range   Procalcitonin 0.26 ng/mL    Comment:        Interpretation: PCT (Procalcitonin) <= 0.5 ng/mL: Systemic infection (sepsis) is not likely. Local bacterial infection is possible. (NOTE)         ICU PCT Algorithm               Non ICU PCT Algorithm    ----------------------------     ------------------------------         PCT < 0.25 ng/mL                 PCT < 0.1 ng/mL     Stopping of antibiotics            Stopping of antibiotics       strongly encouraged.               strongly encouraged.    ----------------------------     ------------------------------       PCT level decrease by               PCT < 0.25 ng/mL       >= 80% from peak PCT       OR PCT 0.25 - 0.5 ng/mL          Stopping of antibiotics                                             encouraged.     Stopping of antibiotics           encouraged.    ----------------------------     ------------------------------       PCT level decrease by              PCT >= 0.25 ng/mL       < 80% from peak PCT        AND PCT >= 0.5 ng/mL            Continuin g antibiotics                                              encouraged.       Continuing antibiotics            encouraged.    ----------------------------     ------------------------------      PCT level increase compared          PCT > 0.5 ng/mL  with peak PCT AND          PCT >= 0.5 ng/mL             Escalation of antibiotics                                          strongly encouraged.      Escalation of antibiotics        strongly encouraged.    Dg Chest 2 View  04/29/2015   CLINICAL DATA:  Shortness of breath, chest and back pain for 3 days.  EXAM: CHEST  2 VIEW  COMPARISON:  December 04, 2011  FINDINGS: The heart size and mediastinal contours are within normal limits. There is no focal infiltrate, pulmonary edema, or pleural effusion. Spinal rods are unchanged. There is scoliosis of spine.  IMPRESSION: No active cardiopulmonary disease.   Electronically Signed   By: Abelardo Diesel M.D.   On: 04/29/2015 18:51   US Abdomen Complete  04/30/2015   CLINICAL DATA:  Chest and abdominal pain for 2 days. History of diabetes, hypertension.  EXAM: ULTRASOUND ABDOMEN COMPLETE  COMPARISON:  CT chest, abdomen and pelvis April 29, 2015  FINDINGS: Gallbladder: Echogenic layering gallbladder sludge in addition to punctate echogenic gallstones. Gallbladder wall thickening at 8 mm. No sonographic Murphy's sign elicited.  Common bile duct: Diameter: 4 mm  Liver: Echogenic heterogeneous liver without intrahepatic biliary dilatation. Hepatopetal portal vein. Hepatopetal portal vein.  IVC: No abnormality visualized.  Pancreas: Visualized portion unremarkable.  Spleen: Size and appearance within normal limits.  Right Kidney: Length: 12.1 cm. Echogenicity within normal limits. No mass or hydronephrosis visualized.  Left Kidney: Length: 12 cm. Echogenicity within normal limits. No mass or hydronephrosis visualized.  Abdominal aorta: No aneurysm visualized.  Other findings: None.  IMPRESSION: Sonographic findings of cholecystitis though, no sonographic Murphy's sign elicited.  Heterogeneous echogenic liver can be seen with hepatocellular disease/steatosis.   Electronically Signed   By: Elon Alas  M.D.   On: 04/30/2015 03:44   Ct Angio Chest Aorta W/cm &/or Wo/cm  04/30/2015   CLINICAL DATA:  Acute onset of generalized chest and abdominal pain. Initial encounter.  EXAM: CT ANGIOGRAPHY CHEST, ABDOMEN AND PELVIS  TECHNIQUE: Multidetector CT imaging through the chest, abdomen and pelvis was performed using the standard protocol during bolus administration of intravenous contrast. Multiplanar reconstructed images and MIPs were obtained and reviewed to evaluate the vascular anatomy.  CONTRAST:  121m OMNIPAQUE IOHEXOL 350 MG/ML SOLN  COMPARISON:  CTA of the chest performed 11/17/2011, and CTA of the abdomen and pelvis from 11/16/2011  FINDINGS: CTA CHEST FINDINGS  There is no evidence of aortic dissection. There is no evidence of aneurysmal dilatation. No significant calcific atherosclerotic disease is noted along the thoracic aorta.  There is no evidence of pulmonary embolus.  Mild bibasilar atelectasis is noted. The lungs are otherwise clear. There is no evidence of pleural effusion or pneumothorax. No masses are identified; no abnormal focal contrast enhancement is seen.  The mediastinum is unremarkable in appearance. No mediastinal lymphadenopathy is seen. No pericardial effusion is identified. The great vessels are grossly unremarkable in appearance. No axillary lymphadenopathy is seen. The thyroid gland is unremarkable in appearance.  No acute osseous abnormalities are seen.  Review of the MIP images confirms the above findings.  CTA ABDOMEN AND PELVIS FINDINGS  There is no evidence of aortic dissection. There  is no evidence of aneurysmal dilatation. No calcific atherosclerotic disease is seen. The celiac trunk, superior mesenteric artery, bilateral renal arteries and inferior mesenteric artery are unremarkable in appearance. The inferior vena cava is within normal limits.  Mild soft tissue inflammation is noted about the gallbladder, concerning for mild cholecystitis. Would correlate for associated  symptoms.  Heterogeneity of the liver may reflect underlying venous congestion. The spleen is unremarkable in appearance. The pancreas and adrenal glands are unremarkable.  Mild nonspecific perinephric stranding is noted bilaterally. The kidneys are otherwise unremarkable. There is no evidence of hydronephrosis. No renal or ureteral stones are seen.  No free fluid is identified. The small bowel is unremarkable in appearance. The stomach is within normal limits. No acute vascular abnormalities are seen.  The appendix is normal caliber, without evidence of appendicitis. The colon is unremarkable in appearance.  The bladder is moderately distended and grossly unremarkable. Mild cystic change is noted at the right side of the prostate. This is somewhat more prominent than in 2013. The prostate remains normal in size. No inguinal lymphadenopathy is seen. A small left inguinal hernia is noted, containing only fat.  No acute osseous abnormalities are identified. There is mild chronic compression deformity at T11, with underlying lumbar spinal fusion at T9-L1.  Review of the MIP images confirms the above findings.  IMPRESSION: 1. No evidence of aortic dissection. No evidence of aneurysmal dilatation. No calcific atherosclerotic disease seen. 2. No evidence of pulmonary embolus. 3. Mild soft tissue inflammation about the gallbladder, raising concern for mild cholecystitis. Would correlate for associated symptoms. 4. Heterogeneity of the liver may reflect underlying venous congestion. 5. Mild bibasilar atelectasis noted; lungs otherwise clear. 6. Mild cystic change at the right side of the prostate. This is somewhat more prominent than in 2013. Would correlate with PSA. 7. Small left inguinal hernia, containing only fat. 8. Mild chronic compression deformity at T11, with underlying postoperative change.   Electronically Signed   By: Garald Balding M.D.   On: 04/30/2015 01:03   Ct Angio Abd/pel W/ And/or W/o  04/30/2015    CLINICAL DATA:  Acute onset of generalized chest and abdominal pain. Initial encounter.  EXAM: CT ANGIOGRAPHY CHEST, ABDOMEN AND PELVIS  TECHNIQUE: Multidetector CT imaging through the chest, abdomen and pelvis was performed using the standard protocol during bolus administration of intravenous contrast. Multiplanar reconstructed images and MIPs were obtained and reviewed to evaluate the vascular anatomy.  CONTRAST:  11m OMNIPAQUE IOHEXOL 350 MG/ML SOLN  COMPARISON:  CTA of the chest performed 11/17/2011, and CTA of the abdomen and pelvis from 11/16/2011  FINDINGS: CTA CHEST FINDINGS  There is no evidence of aortic dissection. There is no evidence of aneurysmal dilatation. No significant calcific atherosclerotic disease is noted along the thoracic aorta.  There is no evidence of pulmonary embolus.  Mild bibasilar atelectasis is noted. The lungs are otherwise clear. There is no evidence of pleural effusion or pneumothorax. No masses are identified; no abnormal focal contrast enhancement is seen.  The mediastinum is unremarkable in appearance. No mediastinal lymphadenopathy is seen. No pericardial effusion is identified. The great vessels are grossly unremarkable in appearance. No axillary lymphadenopathy is seen. The thyroid gland is unremarkable in appearance.  No acute osseous abnormalities are seen.  Review of the MIP images confirms the above findings.  CTA ABDOMEN AND PELVIS FINDINGS  There is no evidence of aortic dissection. There is no evidence of aneurysmal dilatation. No calcific atherosclerotic disease is seen. The celiac trunk, superior  mesenteric artery, bilateral renal arteries and inferior mesenteric artery are unremarkable in appearance. The inferior vena cava is within normal limits.  Mild soft tissue inflammation is noted about the gallbladder, concerning for mild cholecystitis. Would correlate for associated symptoms.  Heterogeneity of the liver may reflect underlying venous congestion. The  spleen is unremarkable in appearance. The pancreas and adrenal glands are unremarkable.  Mild nonspecific perinephric stranding is noted bilaterally. The kidneys are otherwise unremarkable. There is no evidence of hydronephrosis. No renal or ureteral stones are seen.  No free fluid is identified. The small bowel is unremarkable in appearance. The stomach is within normal limits. No acute vascular abnormalities are seen.  The appendix is normal caliber, without evidence of appendicitis. The colon is unremarkable in appearance.  The bladder is moderately distended and grossly unremarkable. Mild cystic change is noted at the right side of the prostate. This is somewhat more prominent than in 2013. The prostate remains normal in size. No inguinal lymphadenopathy is seen. A small left inguinal hernia is noted, containing only fat.  No acute osseous abnormalities are identified. There is mild chronic compression deformity at T11, with underlying lumbar spinal fusion at T9-L1.  Review of the MIP images confirms the above findings.  IMPRESSION: 1. No evidence of aortic dissection. No evidence of aneurysmal dilatation. No calcific atherosclerotic disease seen. 2. No evidence of pulmonary embolus. 3. Mild soft tissue inflammation about the gallbladder, raising concern for mild cholecystitis. Would correlate for associated symptoms. 4. Heterogeneity of the liver may reflect underlying venous congestion. 5. Mild bibasilar atelectasis noted; lungs otherwise clear. 6. Mild cystic change at the right side of the prostate. This is somewhat more prominent than in 2013. Would correlate with PSA. 7. Small left inguinal hernia, containing only fat. 8. Mild chronic compression deformity at T11, with underlying postoperative change.   Electronically Signed   By: Garald Balding M.D.   On: 04/30/2015 01:03       Assessment/Plan 1. Acute cholecystitis with elevated TB of 7.1, ? CBD stone -the patient has cholecystitis seen on [place], but  also has a TB of 7.1 with an increasing direct bilirubin.  We generally do not see just cholecystitis cause such an elevation of TB.  His US shows that his CBD appears normal at 7m.  GI to evaluate.  We would recommend preoperative ERCP.  If GI does not feel this is necessary, then we could likely try to take him to the OR tomorrow for lap chole with IOC. -cont abx therapy -cont NPO for now 2. Defer other medical problems to primary service  Levada Bowersox E 04/30/2015, 10:07 AM Pager: [phone]

## 2015-04-30 NOTE — Consult Note (Signed)
[facility] Consult: 8:38 AM 04/30/2015  LOS: 0 days    Referring Provider:  Dr Tyrell Antonio  Primary Care Physician:  MD and PA at [facility] urgent care.  Primary Gastroenterologist:  Althia Forts Daughter is John Meza at [phone] if you need English speaking contact.     Reason for Consultation:  Cholecystitis.    HPI: John Meza is a 54 y.o. male.  Non insulin dependent diabetic with hx htn. Hx spinal cord injury with paraplegia. Obesity.  Self caths due to neurogenic bladder.   4 days of epigastric region pain radiating to back, worse after meals, has not been eating.  LFTs elevated with AST/ALT 677/534, t bili 6.6, alk phos 248. Lipase 20.  Glucose is 382, was 250 yesterday.   Seen by PMD yesterday and Rx for Dexilant given pt.[name]   Ultrasound with ? fatty liver, GB sludge, 29m wall, 4 mm CBD.  No murphy's sign.   CT angio chest and ab/pelvis.  No PE, mild cholecystitis?, heterongenous liver, perinephric stranding, cystic prostate changes.   Pt comfortable after pain meds.  No nausea until he came to ED, no emesis.  No heartburn, no dysphagia.  Minor associated weight loss, o/w stable weight.   2 to 3 beers per month, no excessive acetominophen.      Past Medical History  Diagnosis Date  . Diabetes mellitus   . Hypertension   . Spine fracture 11/16/2011    T 11- T9-L1  . SCI (spinal cord injury)     Past Surgical History  Procedure Laterality Date  . Spine surgery      Prior to Admission medications   Medication Sig Start Date End Date Taking? Authorizing Provider  cyclobenzaprine (FLEXERIL) 10 MG tablet Take 10 mg by mouth 3 (three) times daily as needed for muscle spasms.   Yes Historical Provider, MD  dexlansoprazole (DEXILANT) 60 MG capsule Take 60 mg by mouth  daily.   Yes Historical Provider, MD  lisinopril (PRINIVIL,ZESTRIL) 5 MG tablet Take 5 mg by mouth daily.   Yes Historical Provider, MD  lovastatin (MEVACOR) 10 MG tablet Take 10 mg by mouth at bedtime.   Yes Historical Provider, MD  metFORMIN (GLUCOPHAGE) 850 MG tablet Take 850 mg by mouth 3 (three) times daily.   Yes Historical Provider, MD  metroNIDAZOLE (FLAGYL) 500 MG tablet Take 500 mg by mouth 2 (two) times daily.   Yes Historical Provider, MD  ondansetron (ZOFRAN) 8 MG tablet Take 8 mg by mouth every 8 (eight) hours as needed for nausea or vomiting.   Yes Historical Provider, MD  pantoprazole (PROTONIX) 40 MG tablet TAKE ONE TABLET BY MOUTH ONCE DAILY 09/12/14  Yes ZMeredith Staggers MD  bisacodyl (DULCOLAX) 10 MG suppository Place 10 mg rectally as needed.    Historical Provider, MD  ciprofloxacin (CIPRO) 250 MG tablet Take 1 tablet (250 mg total) by mouth 2 (two) times daily. Patient not taking: Reported on 04/29/2015 04/12/13   ZMeredith Staggers MD  HYDROcodone-acetaminophen (NORCO/VICODIN) 5-325 MG per tablet Take 1-2 tablets by mouth  every 4 (four) hours as needed for moderate pain or severe pain. 04/30/15   Charlesetta Shanks, MD  pantoprazole (PROTONIX) 20 MG tablet Take 1 tablet (20 mg total) by mouth daily. 04/30/15   Charlesetta Shanks, MD    Scheduled Meds: . insulin aspart  0-9 Units Subcutaneous TID WC  . pantoprazole (PROTONIX) IV  40 mg Intravenous Q12H   Infusions: . piperacillin-tazobactam (ZOSYN)  IV    . sodium chloride 0.9 % 1,000 mL infusion     PRN Meds: acetaminophen **OR** acetaminophen, hydrALAZINE, HYDROmorphone (DILAUDID) injection, ondansetron (ZOFRAN) IV   Allergies as of 04/29/2015  . (No Known Allergies)    Family History  Problem Relation Age of Onset  . Diabetes Father     History   Social History  . Marital Status: Married    Spouse Name: N/A  . Number of Children: N/A  . Years of Education: N/A   Occupational History  . Not on file.   Social  History Main Topics  . Smoking status: Never Smoker   . Smokeless tobacco: Never Used  . Alcohol Use: 0.6 oz/week    1 Cans of beer per week  . Drug Use: No  . Sexual Activity: Not on file   Other Topics Concern  . Not on file   Social History Narrative    REVIEW OF SYSTEMS: Constitutional:  No falls, some weakness with illness.  ENT:  No nose bleeds Pulm:  No SOB or cough.  Not a smoker.  Hurts belly if he breaths deep CV:  No palpitations, no LE edema. No chest pain GU:  Self caths at home.  Last incidence of this was yesterday afternoon GI:  Per HPI Heme:  No issues with unusual bleeding or bruising   Transfusions:  none Neuro:  No headaches, no peripheral tingling or numbness Derm:  No itching, no rash or sores.  Endocrine:  No sweats or chills.  No polyuria or dysuria Immunization:  Not queried Travel:  None beyond local counties in last few months.    PHYSICAL EXAM: Vital signs in last 24 hours: Filed Vitals:   04/30/15 0830  BP: 161/88  Pulse: 118  Temp:   Resp: 22   Wt Readings from Last 3 Encounters:  04/29/15 190 lb (86.183 kg)  04/04/13 190 lb (86.183 kg)  10/10/12 190 lb (86.183 kg)   General: ill, quiet, somewhat uncomfortable Head:  No swelling or assymetry.  Slightly cushingoid faces  Eyes:  No icterus or pallor Ears:  Not HOH  Nose:  No discharge Mouth:  Moist, clear, good dental repair with bridges Neck:  No mass, no JVD, no TMG Lungs:  Clear bil.  No labored breathing.   Heart: RRR Abdomen:  Soft, tender with no guard or rebound at epigastrum.  No HSM , bruits or hernias.   Rectal: deferred   Musc/Skeltl: no joint swelling or redness.  Extremities:  bil LE paralysis.    Neurologic:  Did not test LE mobility or strength.  Full UE strength.  No twitching or tremors Skin:  No rash, no telangectasia, no sores Tattoos:  none Nodes:  No cervical adenopathy.    Psych:  Pleasant, cooperative,  Affect subdued c/w illness.   Intake/Output from  previous day: 07/18 0701 - 07/19 0700 In: 50 [I.V.:50] Out: -  Intake/Output this shift:    LAB RESULTS:  Recent Labs  04/29/15 1826  WBC 10.7*  HGB 14.9  HCT 42.7  PLT 190   BMET Lab Results  Component Value Date   NA 131* 04/29/2015   NA 137 04/08/2012   NA 135 11/19/2011   K 3.9 04/29/2015   K 4.6 04/08/2012   K 4.2 11/19/2011   CL 95* 04/29/2015   CL 99 04/08/2012   CL 100 11/19/2011   CO2 23 04/29/2015   CO2 25 11/19/2011   CO2 28 11/17/2011   GLUCOSE 382* 04/29/2015   GLUCOSE 291* 04/08/2012   GLUCOSE 234* 11/19/2011   BUN 10 04/29/2015   BUN 22 04/08/2012   BUN 12 11/19/2011   CREATININE 0.74 04/29/2015   CREATININE 0.60 04/08/2012   CREATININE 0.70 11/19/2011   CALCIUM 9.4 04/29/2015   CALCIUM 8.3* 11/19/2011   CALCIUM 9.0 11/17/2011   LFT  Recent Labs  04/30/15 0420  PROT 7.9  ALBUMIN 3.8  AST 677*  ALT 534*  ALKPHOS 248*  BILITOT 6.6*  BILIDIR 4.0*  IBILI 2.6*   PT/INR No results found for: INR, PROTIME Hepatitis Panel No results for input(s): HEPBSAG, HCVAB, HEPAIGM, HEPBIGM in the last 72 hours. Lipase     Component Value Date/Time   LIPASE 20* 04/30/2015 0420     RADIOLOGY STUDIES: Dg Chest 2 View  04/29/2015   CLINICAL DATA:  Shortness of breath, chest and back pain for 3 days.  EXAM: CHEST  2 VIEW  COMPARISON:  December 04, 2011  FINDINGS: The heart size and mediastinal contours are within normal limits. There is no focal infiltrate, pulmonary edema, or pleural effusion. Spinal rods are unchanged. There is scoliosis of spine.  IMPRESSION: No active cardiopulmonary disease.   Electronically Signed   By: Abelardo Diesel M.D.   On: 04/29/2015 18:51   US Abdomen Complete  04/30/2015   CLINICAL DATA:  Chest and abdominal pain for 2 days. History of diabetes, hypertension.  EXAM: ULTRASOUND ABDOMEN COMPLETE  COMPARISON:  CT chest, abdomen and pelvis April 29, 2015  FINDINGS: Gallbladder: Echogenic layering gallbladder sludge in addition  to punctate echogenic gallstones. Gallbladder wall thickening at 8 mm. No sonographic Murphy's sign elicited.  Common bile duct: Diameter: 4 mm  Liver: Echogenic heterogeneous liver without intrahepatic biliary dilatation. Hepatopetal portal vein. Hepatopetal portal vein.  IVC: No abnormality visualized.  Pancreas: Visualized portion unremarkable.  Spleen: Size and appearance within normal limits.  Right Kidney: Length: 12.1 cm. Echogenicity within normal limits. No mass or hydronephrosis visualized.  Left Kidney: Length: 12 cm. Echogenicity within normal limits. No mass or hydronephrosis visualized.  Abdominal aorta: No aneurysm visualized.  Other findings: None.  IMPRESSION: Sonographic findings of cholecystitis though, no sonographic Murphy's sign elicited.  Heterogeneous echogenic liver can be seen with hepatocellular disease/steatosis.   Electronically Signed   By: Elon Alas M.D.   On: 04/30/2015 03:44   Ct Angio Chest Aorta W/cm &/or Wo/cm  04/30/2015   CLINICAL DATA:  Acute onset of generalized chest and abdominal pain. Initial encounter.  EXAM: CT ANGIOGRAPHY CHEST, ABDOMEN AND PELVIS  TECHNIQUE: Multidetector CT imaging through the chest, abdomen and pelvis was performed using the standard protocol during bolus administration of intravenous contrast. Multiplanar reconstructed images and MIPs were obtained and reviewed to evaluate the vascular anatomy.  CONTRAST:  114m OMNIPAQUE IOHEXOL 350 MG/ML SOLN  COMPARISON:  CTA of the chest performed 11/17/2011, and CTA of the abdomen and pelvis from 11/16/2011  FINDINGS: CTA CHEST FINDINGS  There is no evidence of aortic dissection. There is no evidence of aneurysmal dilatation. No significant calcific atherosclerotic disease is noted along the thoracic aorta.  There is  no evidence of pulmonary embolus.  Mild bibasilar atelectasis is noted. The lungs are otherwise clear. There is no evidence of pleural effusion or pneumothorax. No masses are identified;  no abnormal focal contrast enhancement is seen.  The mediastinum is unremarkable in appearance. No mediastinal lymphadenopathy is seen. No pericardial effusion is identified. The great vessels are grossly unremarkable in appearance. No axillary lymphadenopathy is seen. The thyroid gland is unremarkable in appearance.  No acute osseous abnormalities are seen.  Review of the MIP images confirms the above findings.  CTA ABDOMEN AND PELVIS FINDINGS  There is no evidence of aortic dissection. There is no evidence of aneurysmal dilatation. No calcific atherosclerotic disease is seen. The celiac trunk, superior mesenteric artery, bilateral renal arteries and inferior mesenteric artery are unremarkable in appearance. The inferior vena cava is within normal limits.  Mild soft tissue inflammation is noted about the gallbladder, concerning for mild cholecystitis. Would correlate for associated symptoms.  Heterogeneity of the liver may reflect underlying venous congestion. The spleen is unremarkable in appearance. The pancreas and adrenal glands are unremarkable.  Mild nonspecific perinephric stranding is noted bilaterally. The kidneys are otherwise unremarkable. There is no evidence of hydronephrosis. No renal or ureteral stones are seen.  No free fluid is identified. The small bowel is unremarkable in appearance. The stomach is within normal limits. No acute vascular abnormalities are seen.  The appendix is normal caliber, without evidence of appendicitis. The colon is unremarkable in appearance.  The bladder is moderately distended and grossly unremarkable. Mild cystic change is noted at the right side of the prostate. This is somewhat more prominent than in 2013. The prostate remains normal in size. No inguinal lymphadenopathy is seen. A small left inguinal hernia is noted, containing only fat.  No acute osseous abnormalities are identified. There is mild chronic compression deformity at T11, with underlying lumbar spinal  fusion at T9-L1.  Review of the MIP images confirms the above findings.  IMPRESSION: 1. No evidence of aortic dissection. No evidence of aneurysmal dilatation. No calcific atherosclerotic disease seen. 2. No evidence of pulmonary embolus. 3. Mild soft tissue inflammation about the gallbladder, raising concern for mild cholecystitis. Would correlate for associated symptoms. 4. Heterogeneity of the liver may reflect underlying venous congestion. 5. Mild bibasilar atelectasis noted; lungs otherwise clear. 6. Mild cystic change at the right side of the prostate. This is somewhat more prominent than in 2013. Would correlate with PSA. 7. Small left inguinal hernia, containing only fat. 8. Mild chronic compression deformity at T11, with underlying postoperative change.   Electronically Signed   By: Garald Balding M.D.   On: 04/30/2015 01:03   Ct Angio Abd/pel W/ And/or W/o  04/30/2015   CLINICAL DATA:  Acute onset of generalized chest and abdominal pain. Initial encounter.  EXAM: CT ANGIOGRAPHY CHEST, ABDOMEN AND PELVIS  TECHNIQUE: Multidetector CT imaging through the chest, abdomen and pelvis was performed using the standard protocol during bolus administration of intravenous contrast. Multiplanar reconstructed images and MIPs were obtained and reviewed to evaluate the vascular anatomy.  CONTRAST:  170m OMNIPAQUE IOHEXOL 350 MG/ML SOLN  COMPARISON:  CTA of the chest performed 11/17/2011, and CTA of the abdomen and pelvis from 11/16/2011  FINDINGS: CTA CHEST FINDINGS  There is no evidence of aortic dissection. There is no evidence of aneurysmal dilatation. No significant calcific atherosclerotic disease is noted along the thoracic aorta.  There is no evidence of pulmonary embolus.  Mild bibasilar atelectasis is noted. The lungs are otherwise clear.  There is no evidence of pleural effusion or pneumothorax. No masses are identified; no abnormal focal contrast enhancement is seen.  The mediastinum is unremarkable in  appearance. No mediastinal lymphadenopathy is seen. No pericardial effusion is identified. The great vessels are grossly unremarkable in appearance. No axillary lymphadenopathy is seen. The thyroid gland is unremarkable in appearance.  No acute osseous abnormalities are seen.  Review of the MIP images confirms the above findings.  CTA ABDOMEN AND PELVIS FINDINGS  There is no evidence of aortic dissection. There is no evidence of aneurysmal dilatation. No calcific atherosclerotic disease is seen. The celiac trunk, superior mesenteric artery, bilateral renal arteries and inferior mesenteric artery are unremarkable in appearance. The inferior vena cava is within normal limits.  Mild soft tissue inflammation is noted about the gallbladder, concerning for mild cholecystitis. Would correlate for associated symptoms.  Heterogeneity of the liver may reflect underlying venous congestion. The spleen is unremarkable in appearance. The pancreas and adrenal glands are unremarkable.  Mild nonspecific perinephric stranding is noted bilaterally. The kidneys are otherwise unremarkable. There is no evidence of hydronephrosis. No renal or ureteral stones are seen.  No free fluid is identified. The small bowel is unremarkable in appearance. The stomach is within normal limits. No acute vascular abnormalities are seen.  The appendix is normal caliber, without evidence of appendicitis. The colon is unremarkable in appearance.  The bladder is moderately distended and grossly unremarkable. Mild cystic change is noted at the right side of the prostate. This is somewhat more prominent than in 2013. The prostate remains normal in size. No inguinal lymphadenopathy is seen. A small left inguinal hernia is noted, containing only fat.  No acute osseous abnormalities are identified. There is mild chronic compression deformity at T11, with underlying lumbar spinal fusion at T9-L1.  Review of the MIP images confirms the above findings.  IMPRESSION:  1. No evidence of aortic dissection. No evidence of aneurysmal dilatation. No calcific atherosclerotic disease seen. 2. No evidence of pulmonary embolus. 3. Mild soft tissue inflammation about the gallbladder, raising concern for mild cholecystitis. Would correlate for associated symptoms. 4. Heterogeneity of the liver may reflect underlying venous congestion. 5. Mild bibasilar atelectasis noted; lungs otherwise clear. 6. Mild cystic change at the right side of the prostate. This is somewhat more prominent than in 2013. Would correlate with PSA. 7. Small left inguinal hernia, containing only fat. 8. Mild chronic compression deformity at T11, with underlying postoperative change.   Electronically Signed   By: Garald Balding M.D.   On: 04/30/2015 01:03    ENDOSCOPIC STUDIES: None ever  IMPRESSION:   *  Symptomatic cholelithiasis, cholecystitis.  Though LFTs elevated, no evidence of choledocholithiasis, normal diameter CBD.   *  NIDDM.  Not well controlled  *  heterogenous liver, ? Fatty liver.  *  Paraplegic post SCI. Also associated neurogenic bladder    PLAN:     *  CMET, coags in AM.  Continue Zosyn.  Stop Protonix. Surgical consult.   *  Note due to rods in spine, pt likely not cndidate for MRCP.    *  Due to family concern re pt having not self cathd since yesterday, I wrote order to allow pt to self cath.    Azucena Freed  04/30/2015, 8:38 AM Pager: [phone] Attending MD note:   I have taken a history, examined the patient, and reviewed the chart. I agree with the Advanced Practitioner's impression and recommendations. Symptoms and imaging studies  Consistent with acute  cholecystitis, also possible hepatocellular disease but no evidence of cirrhosis or portal hypertension. There is no evidence of extrahepatic biliary obstruction (CBD 4 mm) so will not need ERCP. We will see if necessary after the cholecystectomy. Agree with broad spectrum antibiotics. Please check Hepatitis B,C  serologies.  [facility] Pager # [phone]

## 2015-04-30 NOTE — ED Notes (Signed)
MD at bedside. 

## 2015-04-30 NOTE — ED Provider Notes (Signed)
Patient initially seen and evaluated by Dr. Clarice Pole for abdominal pain and signed out to me pending CT angiogram of chest, abdomen, pelvis, and the hepatic function panel and lipase. CT angiogram showed thickened gallbladder worrisome for cholecystitis so he was sent for ultrasound which also showed evidence of cholecystitis with thickened gallbladder wall as well as gallbladder sludge. There is a delay in getting results of liver function panel but, when it came back, he showed evidence of significant obstructive jaundice with bilirubin over 6 with direct bilirubin of 4. There is associated elevation of transaminases and alkaline phosphatase but normal lipase. Case was discussed with Dr. Harlon Flor of general surgery who states that he would see the patient consultation but that he needed to be admitted on the internal medicine service with GI consultation for probable choledocholithiasis even though there isn't no mention of choledocholithiasis on ultrasound report. Case is discussed with Dr. Toniann Fail of [facility] agrees to admit the patient. He was started on Zosyn for antibiotic coverage of his cholecystitis.  Results for orders placed or performed during the hospital encounter of 04/29/15  Basic metabolic panel  Result Value Ref Range   Sodium 131 (L) 135 - 145 mmol/L   Potassium 3.9 3.5 - 5.1 mmol/L   Chloride 95 (L) 101 - 111 mmol/L   CO2 23 22 - 32 mmol/L   Glucose, Bld 382 (H) 65 - 99 mg/dL   BUN 10 6 - 20 mg/dL   Creatinine, Ser 7.82 0.61 - 1.24 mg/dL   Calcium 9.4 8.9 - 95.6 mg/dL   GFR calc non Af Amer >60 >60 mL/min   GFR calc Af Amer >60 >60 mL/min   Anion gap 13 5 - 15  CBC  Result Value Ref Range   WBC 10.7 (H) 4.0 - 10.5 K/uL   RBC 5.02 4.22 - 5.81 MIL/uL   Hemoglobin 14.9 13.0 - 17.0 g/dL   HCT 21.3 08.6 - 57.8 %   MCV 85.1 78.0 - 100.0 fL   MCH 29.7 26.0 - 34.0 pg   MCHC 34.9 30.0 - 36.0 g/dL   RDW 46.9 62.9 - 52.8 %   Platelets 190 150 - 400 K/uL  Hepatic function  panel  Result Value Ref Range   Total Protein 7.9 6.5 - 8.1 g/dL   Albumin 3.8 3.5 - 5.0 g/dL   AST 413 (H) 15 - 41 U/L   ALT 534 (H) 17 - 63 U/L   Alkaline Phosphatase 248 (H) 38 - 126 U/L   Total Bilirubin 6.6 (H) 0.3 - 1.2 mg/dL   Bilirubin, Direct 4.0 (H) 0.1 - 0.5 mg/dL   Indirect Bilirubin 2.6 (H) 0.3 - 0.9 mg/dL  Lipase, blood  Result Value Ref Range   Lipase 20 (L) 22 - 51 U/L  I-stat troponin, ED  Result Value Ref Range   Troponin i, poc 0.00 0.00 - 0.08 ng/mL   Comment 3           Dg Chest 2 View  04/29/2015   CLINICAL DATA:  Shortness of breath, chest and back pain for 3 days.  EXAM: CHEST  2 VIEW  COMPARISON:  December 04, 2011  FINDINGS: The heart size and mediastinal contours are within normal limits. There is no focal infiltrate, pulmonary edema, or pleural effusion. Spinal rods are unchanged. There is scoliosis of spine.  IMPRESSION: No active cardiopulmonary disease.   Electronically Signed   By: Sherian Rein M.D.   On: 04/29/2015 18:51   US Abdomen Complete  04/30/2015   CLINICAL DATA:  Chest and abdominal pain for 2 days. History of diabetes, hypertension.  EXAM: ULTRASOUND ABDOMEN COMPLETE  COMPARISON:  CT chest, abdomen and pelvis April 29, 2015  FINDINGS: Gallbladder: Echogenic layering gallbladder sludge in addition to punctate echogenic gallstones. Gallbladder wall thickening at 8 mm. No sonographic Murphy's sign elicited.  Common bile duct: Diameter: 4 mm  Liver: Echogenic heterogeneous liver without intrahepatic biliary dilatation. Hepatopetal portal vein. Hepatopetal portal vein.  IVC: No abnormality visualized.  Pancreas: Visualized portion unremarkable.  Spleen: Size and appearance within normal limits.  Right Kidney: Length: 12.1 cm. Echogenicity within normal limits. No mass or hydronephrosis visualized.  Left Kidney: Length: 12 cm. Echogenicity within normal limits. No mass or hydronephrosis visualized.  Abdominal aorta: No aneurysm visualized.  Other findings:  None.  IMPRESSION: Sonographic findings of cholecystitis though, no sonographic Murphy's sign elicited.  Heterogeneous echogenic liver can be seen with hepatocellular disease/steatosis.   Electronically Signed   By: Awilda Metro M.D.   On: 04/30/2015 03:44   Ct Angio Chest Aorta W/cm &/or Wo/cm  04/30/2015   CLINICAL DATA:  Acute onset of generalized chest and abdominal pain. Initial encounter.  EXAM: CT ANGIOGRAPHY CHEST, ABDOMEN AND PELVIS  TECHNIQUE: Multidetector CT imaging through the chest, abdomen and pelvis was performed using the standard protocol during bolus administration of intravenous contrast. Multiplanar reconstructed images and MIPs were obtained and reviewed to evaluate the vascular anatomy.  CONTRAST:  OMNIPAQUE IOHEXOL 350 MG/ML SOLN  COMPARISON:  CTA of the chest performed 11/17/2011, and CTA of the abdomen and pelvis from 11/16/2011  FINDINGS: CTA CHEST FINDINGS  There is no evidence of aortic dissection. There is no evidence of aneurysmal dilatation. No significant calcific atherosclerotic disease is noted along the thoracic aorta.  There is no evidence of pulmonary embolus.  Mild bibasilar atelectasis is noted. The lungs are otherwise clear. There is no evidence of pleural effusion or pneumothorax. No masses are identified; no abnormal focal contrast enhancement is seen.  The mediastinum is unremarkable in appearance. No mediastinal lymphadenopathy is seen. No pericardial effusion is identified. The great vessels are grossly unremarkable in appearance. No axillary lymphadenopathy is seen. The thyroid gland is unremarkable in appearance.  No acute osseous abnormalities are seen.  Review of the MIP images confirms the above findings.  CTA ABDOMEN AND PELVIS FINDINGS  There is no evidence of aortic dissection. There is no evidence of aneurysmal dilatation. No calcific atherosclerotic disease is seen. The celiac trunk, superior mesenteric artery, bilateral renal arteries and  inferior mesenteric artery are unremarkable in appearance. The inferior vena cava is within normal limits.  Mild soft tissue inflammation is noted about the gallbladder, concerning for mild cholecystitis. Would correlate for associated symptoms.  Heterogeneity of the liver may reflect underlying venous congestion. The spleen is unremarkable in appearance. The pancreas and adrenal glands are unremarkable.  Mild nonspecific perinephric stranding is noted bilaterally. The kidneys are otherwise unremarkable. There is no evidence of hydronephrosis. No renal or ureteral stones are seen.  No free fluid is identified. The small bowel is unremarkable in appearance. The stomach is within normal limits. No acute vascular abnormalities are seen.  The appendix is normal caliber, without evidence of appendicitis. The colon is unremarkable in appearance.  The bladder is moderately distended and grossly unremarkable. Mild cystic change is noted at the right side of the prostate. This is somewhat more prominent than in 2013. The prostate remains normal in size. No inguinal lymphadenopathy is seen.  A small left inguinal hernia is noted, containing only fat.  No acute osseous abnormalities are identified. There is mild chronic compression deformity at T11, with underlying lumbar spinal fusion at T9-L1.  Review of the MIP images confirms the above findings.  IMPRESSION: 1. No evidence of aortic dissection. No evidence of aneurysmal dilatation. No calcific atherosclerotic disease seen. 2. No evidence of pulmonary embolus. 3. Mild soft tissue inflammation about the gallbladder, raising concern for mild cholecystitis. Would correlate for associated symptoms. 4. Heterogeneity of the liver may reflect underlying venous congestion. 5. Mild bibasilar atelectasis noted; lungs otherwise clear. 6. Mild cystic change at the right side of the prostate. This is somewhat more prominent than in 2013. Would correlate with PSA. 7. Small left inguinal  hernia, containing only fat. 8. Mild chronic compression deformity at T11, with underlying postoperative change.   Electronically Signed   By: Roanna Raider M.D.   On: 04/30/2015 01:03   Ct Angio Abd/pel W/ And/or W/o  04/30/2015   CLINICAL DATA:  Acute onset of generalized chest and abdominal pain. Initial encounter.  EXAM: CT ANGIOGRAPHY CHEST, ABDOMEN AND PELVIS  TECHNIQUE: Multidetector CT imaging through the chest, abdomen and pelvis was performed using the standard protocol during bolus administration of intravenous contrast. Multiplanar reconstructed images and MIPs were obtained and reviewed to evaluate the vascular anatomy.  CONTRAST:  OMNIPAQUE IOHEXOL 350 MG/ML SOLN  COMPARISON:  CTA of the chest performed 11/17/2011, and CTA of the abdomen and pelvis from 11/16/2011  FINDINGS: CTA CHEST FINDINGS  There is no evidence of aortic dissection. There is no evidence of aneurysmal dilatation. No significant calcific atherosclerotic disease is noted along the thoracic aorta.  There is no evidence of pulmonary embolus.  Mild bibasilar atelectasis is noted. The lungs are otherwise clear. There is no evidence of pleural effusion or pneumothorax. No masses are identified; no abnormal focal contrast enhancement is seen.  The mediastinum is unremarkable in appearance. No mediastinal lymphadenopathy is seen. No pericardial effusion is identified. The great vessels are grossly unremarkable in appearance. No axillary lymphadenopathy is seen. The thyroid gland is unremarkable in appearance.  No acute osseous abnormalities are seen.  Review of the MIP images confirms the above findings.  CTA ABDOMEN AND PELVIS FINDINGS  There is no evidence of aortic dissection. There is no evidence of aneurysmal dilatation. No calcific atherosclerotic disease is seen. The celiac trunk, superior mesenteric artery, bilateral renal arteries and inferior mesenteric artery are unremarkable in appearance. The inferior vena cava is  within normal limits.  Mild soft tissue inflammation is noted about the gallbladder, concerning for mild cholecystitis. Would correlate for associated symptoms.  Heterogeneity of the liver may reflect underlying venous congestion. The spleen is unremarkable in appearance. The pancreas and adrenal glands are unremarkable.  Mild nonspecific perinephric stranding is noted bilaterally. The kidneys are otherwise unremarkable. There is no evidence of hydronephrosis. No renal or ureteral stones are seen.  No free fluid is identified. The small bowel is unremarkable in appearance. The stomach is within normal limits. No acute vascular abnormalities are seen.  The appendix is normal caliber, without evidence of appendicitis. The colon is unremarkable in appearance.  The bladder is moderately distended and grossly unremarkable. Mild cystic change is noted at the right side of the prostate. This is somewhat more prominent than in 2013. The prostate remains normal in size. No inguinal lymphadenopathy is seen. A small left inguinal hernia is noted, containing only fat.  No acute osseous abnormalities are  identified. There is mild chronic compression deformity at T11, with underlying lumbar spinal fusion at T9-L1.  Review of the MIP images confirms the above findings.  IMPRESSION: 1. No evidence of aortic dissection. No evidence of aneurysmal dilatation. No calcific atherosclerotic disease seen. 2. No evidence of pulmonary embolus. 3. Mild soft tissue inflammation about the gallbladder, raising concern for mild cholecystitis. Would correlate for associated symptoms. 4. Heterogeneity of the liver may reflect underlying venous congestion. 5. Mild bibasilar atelectasis noted; lungs otherwise clear. 6. Mild cystic change at the right side of the prostate. This is somewhat more prominent than in 2013. Would correlate with PSA. 7. Small left inguinal hernia, containing only fat. 8. Mild chronic compression deformity at T11, with  underlying postoperative change.   Electronically Signed   By: Roanna RaiderJeffery  Chang M.D.   On: 04/30/2015 01:03      Dione Boozeavid Kenzington Mielke, MD 04/30/15 [id number]

## 2015-04-30 NOTE — Progress Notes (Signed)
UR completed.    Coco Sharpnack Wise Tineka Uriegas, RN, BSN [phone] 

## 2015-05-01 ENCOUNTER — Encounter (HOSPITAL_COMMUNITY)
Admission: EM | Disposition: A | Discharge status: Home / Self Care | Payer: Self-pay | Source: Home / Self Care | Attending: Internal Medicine

## 2015-05-01 ENCOUNTER — Inpatient Hospital Stay (HOSPITAL_COMMUNITY): Payer: Medicaid Other | Admitting: Certified Registered"

## 2015-05-01 ENCOUNTER — Encounter (HOSPITAL_COMMUNITY): Payer: Self-pay | Admitting: Certified Registered"

## 2015-05-01 HISTORY — PX: CHOLECYSTECTOMY: SHX55

## 2015-05-01 LAB — COMPREHENSIVE METABOLIC PANEL
ALBUMIN: 3 g/dL — AB (ref 3.5–5.0)
ALK PHOS: 288 U/L — AB (ref 38–126)
ALT: 359 U/L — ABNORMAL HIGH (ref 17–63)
ALT: 290 U/L — AB (ref 17–63)
ANION GAP: 9 (ref 5–15)
AST: 184 U/L — AB (ref 15–41)
AST: 167 U/L — ABNORMAL HIGH (ref 15–41)
Albumin: 2.5 g/dL — ABNORMAL LOW (ref 3.5–5.0)
Alkaline Phosphatase: 281 U/L — ABNORMAL HIGH (ref 38–126)
Anion gap: 16 — ABNORMAL HIGH (ref 5–15)
BUN: <5 mg/dL — ABNORMAL LOW (ref 6–20)
BUN: <5 mg/dL — ABNORMAL LOW (ref 6–20)
CALCIUM: 8.4 mg/dL — AB (ref 8.9–10.3)
CALCIUM: 8.2 mg/dL — AB (ref 8.9–10.3)
CHLORIDE: 103 mmol/L (ref 101–111)
CO2: 16 mmol/L — ABNORMAL LOW (ref 22–32)
CO2: 19 mmol/L — ABNORMAL LOW (ref 22–32)
Chloride: 97 mmol/L — ABNORMAL LOW (ref 101–111)
Creatinine, Ser: 0.62 mg/dL (ref 0.61–1.24)
Creatinine, Ser: 0.6 mg/dL — ABNORMAL LOW (ref 0.61–1.24)
GFR calc Af Amer: >60 mL/min (ref >60)
GFR calc non Af Amer: >60 mL/min (ref >60)
GLUCOSE: 184 mg/dL — AB (ref 65–99)
Glucose, Bld: 295 mg/dL — ABNORMAL HIGH (ref 65–99)
POTASSIUM: 3.4 mmol/L — AB (ref 3.5–5.1)
Potassium: 3.4 mmol/L — ABNORMAL LOW (ref 3.5–5.1)
SODIUM: 129 mmol/L — AB (ref 135–145)
Sodium: 131 mmol/L — ABNORMAL LOW (ref 135–145)
TOTAL PROTEIN: 6.4 g/dL — AB (ref 6.5–8.1)
TOTAL PROTEIN: 6.2 g/dL — AB (ref 6.5–8.1)
Total Bilirubin: 8.4 mg/dL — ABNORMAL HIGH (ref 0.3–1.2)
Total Bilirubin: 7.5 mg/dL — ABNORMAL HIGH (ref 0.3–1.2)

## 2015-05-01 LAB — CBC
HEMATOCRIT: 39.3 % (ref 39.0–52.0)
HEMATOCRIT: 35.1 % — AB (ref 39.0–52.0)
HEMOGLOBIN: 13.6 g/dL (ref 13.0–17.0)
Hemoglobin: 12 g/dL — ABNORMAL LOW (ref 13.0–17.0)
MCH: 30.2 pg (ref 26.0–34.0)
MCH: 29.6 pg (ref 26.0–34.0)
MCHC: 34.6 g/dL (ref 30.0–36.0)
MCHC: 34.2 g/dL (ref 30.0–36.0)
MCV: 87.1 fL (ref 78.0–100.0)
MCV: 86.5 fL (ref 78.0–100.0)
Platelets: 156 10*3/uL (ref 150–400)
Platelets: 175 10*3/uL (ref 150–400)
RBC: 4.51 MIL/uL (ref 4.22–5.81)
RBC: 4.06 MIL/uL — ABNORMAL LOW (ref 4.22–5.81)
RDW: 14.2 % (ref 11.5–15.5)
RDW: 14.2 % (ref 11.5–15.5)
WBC: 16.6 10*3/uL — ABNORMAL HIGH (ref 4.0–10.5)
WBC: 9 10*3/uL (ref 4.0–10.5)

## 2015-05-01 LAB — HEPATITIS PANEL, ACUTE
HCV Ab: <0.1 s/co ratio (ref 0.0–0.9)
HEP B C IGM: NEGATIVE
HEP B S AG: NEGATIVE
Hep A IgM: NEGATIVE

## 2015-05-01 LAB — GLUCOSE, CAPILLARY
GLUCOSE-CAPILLARY: 245 mg/dL — AB (ref 65–99)
Glucose-Capillary: 162 mg/dL — ABNORMAL HIGH (ref 65–99)
Glucose-Capillary: 166 mg/dL — ABNORMAL HIGH (ref 65–99)
Glucose-Capillary: 179 mg/dL — ABNORMAL HIGH (ref 65–99)
Glucose-Capillary: 262 mg/dL — ABNORMAL HIGH (ref 65–99)

## 2015-05-01 LAB — URINE CULTURE: Culture: 5000

## 2015-05-01 LAB — TSH: TSH: 0.46 u[IU]/mL (ref 0.350–4.500)

## 2015-05-01 LAB — PROTIME-INR
INR: 1.26 (ref 0.00–1.49)
Prothrombin Time: 15.9 seconds — ABNORMAL HIGH (ref 11.6–15.2)

## 2015-05-01 LAB — LACTIC ACID, PLASMA: LACTIC ACID, VENOUS: 1.2 mmol/L (ref 0.5–2.0)

## 2015-05-01 LAB — MAGNESIUM: Magnesium: 1.4 mg/dL — ABNORMAL LOW (ref 1.7–2.4)

## 2015-05-01 SURGERY — LAPAROSCOPIC CHOLECYSTECTOMY WITH INTRAOPERATIVE CHOLANGIOGRAM
Anesthesia: General | Site: Abdomen

## 2015-05-01 MED ORDER — 0.9 % SODIUM CHLORIDE (POUR BTL) OPTIME
TOPICAL | Status: DC | PRN
  Administered 2015-05-01: 1000 mL

## 2015-05-01 MED ORDER — DILTIAZEM HCL 25 MG/5ML IV SOLN
10.0000 mg | Freq: Once | INTRAVENOUS | Status: AC
  Administered 2015-05-01: 10 mg via INTRAVENOUS
  Filled 2015-05-01: qty 5

## 2015-05-01 MED ORDER — POTASSIUM CHLORIDE IN NACL 20-0.9 MEQ/L-% IV SOLN
INTRAVENOUS | Status: DC
  Administered 2015-05-01 – 2015-05-04 (×6): via INTRAVENOUS
  Administered 2015-05-05: 50 mL/h via INTRAVENOUS
  Administered 2015-05-06: 04:00:00 via INTRAVENOUS
  Filled 2015-05-01 (×13): qty 1000

## 2015-05-01 MED ORDER — PROPOFOL 10 MG/ML IV BOLUS
INTRAVENOUS | Status: DC | PRN
  Administered 2015-05-01: 170 mg via INTRAVENOUS

## 2015-05-01 MED ORDER — ROCURONIUM BROMIDE 100 MG/10ML IV SOLN
INTRAVENOUS | Status: DC | PRN
  Administered 2015-05-01: 50 mg via INTRAVENOUS

## 2015-05-01 MED ORDER — PHENYLEPHRINE 40 MCG/ML (10ML) SYRINGE FOR IV PUSH (FOR BLOOD PRESSURE SUPPORT)
PREFILLED_SYRINGE | INTRAVENOUS | Status: AC
  Filled 2015-05-01: qty 10

## 2015-05-01 MED ORDER — MIDAZOLAM HCL 5 MG/5ML IJ SOLN
INTRAMUSCULAR | Status: DC | PRN
  Administered 2015-05-01: 2 mg via INTRAVENOUS

## 2015-05-01 MED ORDER — MORPHINE SULFATE 2 MG/ML IJ SOLN
1.0000 mg | INTRAMUSCULAR | Status: DC | PRN
  Administered 2015-05-01 – 2015-05-02 (×5): 2 mg via INTRAVENOUS
  Filled 2015-05-01 (×5): qty 1

## 2015-05-01 MED ORDER — POTASSIUM CHLORIDE CRYS ER 20 MEQ PO TBCR
40.0000 meq | EXTENDED_RELEASE_TABLET | Freq: Once | ORAL | Status: AC
  Administered 2015-05-01: 40 meq via ORAL
  Filled 2015-05-01: qty 2

## 2015-05-01 MED ORDER — ONDANSETRON HCL 4 MG/2ML IJ SOLN
INTRAMUSCULAR | Status: DC | PRN
  Administered 2015-05-01: 4 mg via INTRAVENOUS

## 2015-05-01 MED ORDER — PROMETHAZINE HCL 25 MG/ML IJ SOLN: 6.2500 mg | INTRAMUSCULAR | Status: DC | PRN

## 2015-05-01 MED ORDER — GLYCOPYRROLATE 0.2 MG/ML IJ SOLN
INTRAMUSCULAR | Status: DC | PRN
  Administered 2015-05-01: 0.4 mg via INTRAVENOUS

## 2015-05-01 MED ORDER — FENTANYL CITRATE (PF) 250 MCG/5ML IJ SOLN
INTRAMUSCULAR | Status: AC
  Filled 2015-05-01: qty 5

## 2015-05-01 MED ORDER — HYDROMORPHONE HCL 1 MG/ML IJ SOLN
0.2500 mg | INTRAMUSCULAR | Status: DC | PRN
  Administered 2015-05-01 (×4): 0.5 mg via INTRAVENOUS

## 2015-05-01 MED ORDER — HEMOSTATIC AGENTS (NO CHARGE) OPTIME
TOPICAL | Status: DC | PRN
  Administered 2015-05-01: 1 via TOPICAL

## 2015-05-01 MED ORDER — METOPROLOL TARTRATE 1 MG/ML IV SOLN
10.0000 mg | Freq: Once | INTRAVENOUS | Status: AC
  Administered 2015-05-01: 10 mg via INTRAVENOUS
  Filled 2015-05-01: qty 10

## 2015-05-01 MED ORDER — HYDROMORPHONE HCL 1 MG/ML IJ SOLN
INTRAMUSCULAR | Status: AC
  Filled 2015-05-01: qty 1

## 2015-05-01 MED ORDER — OXYCODONE HCL 5 MG PO TABS: 5.0000 mg | ORAL_TABLET | ORAL | Status: DC | PRN

## 2015-05-01 MED ORDER — BUPIVACAINE-EPINEPHRINE (PF) 0.25% -1:200000 IJ SOLN
INTRAMUSCULAR | Status: AC
  Filled 2015-05-01: qty 30

## 2015-05-01 MED ORDER — BUPIVACAINE-EPINEPHRINE 0.25% -1:200000 IJ SOLN
INTRAMUSCULAR | Status: DC | PRN
  Administered 2015-05-01: 21 mL

## 2015-05-01 MED ORDER — SODIUM CHLORIDE 0.9 % IV SOLN
INTRAVENOUS | Status: DC | PRN
  Administered 2015-05-01: 1 mL

## 2015-05-01 MED ORDER — SODIUM CHLORIDE 0.9 % IV SOLN
INTRAVENOUS | Status: DC | PRN
  Administered 2015-05-01: 09:00:00 via INTRAVENOUS

## 2015-05-01 MED ORDER — FENTANYL CITRATE (PF) 250 MCG/5ML IJ SOLN
INTRAMUSCULAR | Status: DC | PRN
  Administered 2015-05-01: 50 ug via INTRAVENOUS
  Administered 2015-05-01: 100 ug via INTRAVENOUS
  Administered 2015-05-01: 150 ug via INTRAVENOUS
  Administered 2015-05-01: 50 ug via INTRAVENOUS

## 2015-05-01 MED ORDER — NEOSTIGMINE METHYLSULFATE 10 MG/10ML IV SOLN
INTRAVENOUS | Status: DC | PRN
  Administered 2015-05-01: 3 mg via INTRAVENOUS

## 2015-05-01 MED ORDER — HYDROMORPHONE HCL 1 MG/ML IJ SOLN: 0.5000 mg | INTRAMUSCULAR | Status: DC | PRN

## 2015-05-01 MED ORDER — MIDAZOLAM HCL 2 MG/2ML IJ SOLN
INTRAMUSCULAR | Status: AC
  Filled 2015-05-01: qty 2

## 2015-05-01 MED ORDER — PHENYLEPHRINE HCL 10 MG/ML IJ SOLN
INTRAMUSCULAR | Status: DC | PRN
  Administered 2015-05-01: 80 ug via INTRAVENOUS

## 2015-05-01 MED ORDER — LACTATED RINGERS IV SOLN
INTRAVENOUS | Status: DC | PRN
  Administered 2015-05-01: 10:00:00 via INTRAVENOUS

## 2015-05-01 MED ORDER — LIDOCAINE HCL (CARDIAC) 20 MG/ML IV SOLN
INTRAVENOUS | Status: DC | PRN
  Administered 2015-05-01: 100 mg via INTRAVENOUS

## 2015-05-01 MED ORDER — PHENOL 1.4 % MT LIQD
1.0000 | OROMUCOSAL | Status: DC | PRN
  Administered 2015-05-01: 1 via OROMUCOSAL
  Filled 2015-05-01 (×2): qty 177

## 2015-05-01 MED ORDER — SODIUM CHLORIDE 0.9 % IV BOLUS (SEPSIS)
500.0000 mL | Freq: Once | INTRAVENOUS | Status: AC
  Administered 2015-05-01: 500 mL via INTRAVENOUS

## 2015-05-01 MED ORDER — SODIUM CHLORIDE 0.9 % IR SOLN
Status: DC | PRN
Start: 2015-05-01 — End: 2015-05-01
  Administered 2015-05-01: 1000 mL

## 2015-05-01 SURGICAL SUPPLY — 44 items
APPLIER CLIP 5 13 M/L LIGAMAX5 (MISCELLANEOUS) ×2
APR CLP MED LRG 5 ANG JAW (MISCELLANEOUS) ×1
BAG SPEC RTRVL 10 TROC 200 (ENDOMECHANICALS) ×1
BLADE SURG ROTATE 9660 (MISCELLANEOUS) IMPLANT
CANISTER SUCTION 2500CC (MISCELLANEOUS) ×2 IMPLANT
CHLORAPREP W/TINT 26ML (MISCELLANEOUS) ×2 IMPLANT
CLIP APPLIE 5 13 M/L LIGAMAX5 (MISCELLANEOUS) ×1 IMPLANT
COVER MAYO STAND STRL (DRAPES) ×1 IMPLANT
COVER SURGICAL LIGHT HANDLE (MISCELLANEOUS) ×2 IMPLANT
DRAPE C-ARM 42X72 X-RAY (DRAPES) ×1 IMPLANT
ELECT REM PT RETURN 9FT ADLT (ELECTROSURGICAL) ×2
ELECTRODE REM PT RTRN 9FT ADLT (ELECTROSURGICAL) ×1 IMPLANT
FILTER SMOKE EVAC LAPAROSHD (FILTER) IMPLANT
GLOVE BIO SURGEON STRL SZ8 (GLOVE) ×2 IMPLANT
GLOVE BIOGEL PI IND STRL 6.5 (GLOVE) ×1 IMPLANT
GLOVE BIOGEL PI IND STRL 8 (GLOVE) ×1 IMPLANT
GLOVE BIOGEL PI INDICATOR 6.5 (GLOVE) ×1
GLOVE BIOGEL PI INDICATOR 8 (GLOVE) ×1
GOWN STRL REUS W/ TWL LRG LVL3 (GOWN DISPOSABLE) ×2 IMPLANT
GOWN STRL REUS W/ TWL XL LVL3 (GOWN DISPOSABLE) ×1 IMPLANT
GOWN STRL REUS W/TWL LRG LVL3 (GOWN DISPOSABLE) ×4
GOWN STRL REUS W/TWL XL LVL3 (GOWN DISPOSABLE) ×2
HEMOSTAT SNOW SURGICEL 2X4 (HEMOSTASIS) ×1 IMPLANT
KIT BASIN OR (CUSTOM PROCEDURE TRAY) ×2 IMPLANT
KIT ROOM TURNOVER OR (KITS) ×2 IMPLANT
LIQUID BAND (GAUZE/BANDAGES/DRESSINGS) ×2 IMPLANT
NEEDLE 22X1 1/2 (OR ONLY) (NEEDLE) ×2 IMPLANT
NS IRRIG 1000ML POUR BTL (IV SOLUTION) ×2 IMPLANT
PAD ARMBOARD 7.5X6 YLW CONV (MISCELLANEOUS) ×2 IMPLANT
POUCH RETRIEVAL ECOSAC 10 (ENDOMECHANICALS) ×1 IMPLANT
POUCH RETRIEVAL ECOSAC 10MM (ENDOMECHANICALS) ×1
SCISSORS LAP 5X35 DISP (ENDOMECHANICALS) ×2 IMPLANT
SET CHOLANGIOGRAPH 5 50 .035 (SET/KITS/TRAYS/PACK) ×1 IMPLANT
SET IRRIG TUBING LAPAROSCOPIC (IRRIGATION / IRRIGATOR) ×2 IMPLANT
SLEEVE ENDOPATH XCEL 5M (ENDOMECHANICALS) ×4 IMPLANT
SPECIMEN JAR SMALL (MISCELLANEOUS) ×2 IMPLANT
SUT VIC AB 4-0 PS2 27 (SUTURE) ×2 IMPLANT
TOWEL OR 17X24 6PK STRL BLUE (TOWEL DISPOSABLE) ×2 IMPLANT
TOWEL OR 17X26 10 PK STRL BLUE (TOWEL DISPOSABLE) ×2 IMPLANT
TRAY LAPAROSCOPIC MC (CUSTOM PROCEDURE TRAY) ×2 IMPLANT
TROCAR BLADELESS 11MM (ENDOMECHANICALS) ×1 IMPLANT
TROCAR XCEL BLUNT TIP 100MML (ENDOMECHANICALS) ×2 IMPLANT
TROCAR XCEL NON-BLD 5MMX100MML (ENDOMECHANICALS) ×2 IMPLANT
TUBING INSUFFLATION (TUBING) ×2 IMPLANT

## 2015-05-01 NOTE — Transfer of Care (Signed)
Immediate Anesthesia Transfer of Care Note  Patient: John Meza  Procedure(s) Performed: Procedure(s): LAPAROSCOPIC CHOLECYSTECTOMY WITH ATTEMPTED INTRAOPERATIVE CHOLANGIOGRAM (N/A)  Patient Location: PACU  Anesthesia Type:General  Level of Consciousness: awake, alert , oriented and patient cooperative  Airway & Oxygen Therapy: Patient Spontanous Breathing and Patient connected to face mask oxygen  Post-op Assessment: Report given to RN, Post -op Vital signs reviewed and stable and Patient moving all extremities  Post vital signs: Reviewed and stable  Last Vitals:  Filed Vitals:   05/01/15 0800  BP: 158/94  Pulse: 121  Temp: 37.3 C  Resp: 18    Complications: No apparent anesthesia complications

## 2015-05-01 NOTE — Anesthesia Preprocedure Evaluation (Signed)
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    GI/Hepatic   Endo/Other  diabetes, Poorly Controlled  Renal/GU      Musculoskeletal   Abdominal   Peds  Hematology   Anesthesia Other Findings   Reproductive/Obstetrics                             Anesthesia Physical Anesthesia Plan  ASA: III  Anesthesia Plan: General   Post-op Pain Management:    Induction: Intravenous  Airway Management Planned: Oral ETT  Additional Equipment:   Intra-op Plan:   Post-operative Plan: Extubation in OR  Informed Consent: I have reviewed the patients History and Physical, chart, labs and discussed the procedure including the risks, benefits and alternatives for the proposed anesthesia with the patient or authorized representative who has indicated his/her understanding and acceptance.   Dental advisory given  Plan Discussed with:   Anesthesia Plan Comments:         Anesthesia Quick Evaluation

## 2015-05-01 NOTE — Progress Notes (Signed)
[facility] TEAM 1 - Stepdown/ICU TEAM Progress Note  John PavlovJose Meza ZOX:[id number] DOB: Feb 03, 1961 DOA: 04/29/2015 PCP: No PCP Per Patient  Admit HPI / Brief Narrative: 54 y.o. male with Hx significant for HTN and Diabetes who presented complaining of sharp constant abdominal pain after meals that started 4 days prior to admission.   Evaluation in the ED: Alkaline phosphatase 248, AST 677, ALT 534, bili 6.6, Abdominal US; sign of cholecystitis, CT angio chest abdomen: no PE, no dissection, gallbladder with evidence of cholecystitis.   HPI/Subjective: The patient is seen in his room post operatively with his daughter translating on his behalf.  He denies chest pain shortness of breath nausea or vomiting but does have some right upper quadrant abdominal pain.  He has had difficulty with tachycardia this afternoon.  Assessment/Plan:  Acute cholecystitis with possible choledocholithiasis Care as per General Surgery and GI - intraoperative cholangiogram not able to be accomplished due to very short cystic duct - may yet require ERCP  Sinus tachycardia v/s other SVT It appears the patient is suffering with simple sinus tachycardia related to his relapsing fever and his acute illness as well as pain - he is not in distress and his respiratory status is stable - I will challenge him with a fluid bolus, increase his pain medication, and give him an IV dose of beta blocker - we will follow his heart rate closely in the SDU - if these measures do not lead to an improvement in his heart rate we will consider vagal maneuvers, carotid sinus massage, and possible adenosine  Sepsis due to biliary infection Continue volume resuscitation and follow tachycardia   DM Follow CBG w/ SSI for now   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 rate 21, height 5\' 7"  (1.702 m), weight 86.183 kg (190 lb), SpO2 97 %.  Intake/Output Summary (Last 24 hours) at 05/01/15 1702 Last data filed at 05/01/15 1210  Gross per 24 hour  Intake   3600 ml  Output   2825 ml  Net    775 ml   Exam: General: No acute respiratory distress - alert and conversant Lungs: Clear to auscultation bilaterally without wheezes or crackles Cardiovascular: Tachycardic at 130 bpm but regular without appreciable rub or murmur Abdomen: Mildly tender in right upper quadrant, soft, bowel sounds hypoactive, no rebound, no ascites, no appreciable mass Extremities: No significant cyanosis, clubbing, or edema bilateral lower extremities  Data Reviewed: Basic Metabolic Panel:  Recent Labs Lab 04/29/15 1826 05/01/15 0307  NA 131* 129*  K 3.9 3.4*  CL 95* 97*  CO2 23 16*  GLUCOSE 382* 184*  BUN 10 <5*  CREATININE 0.74 0.62  CALCIUM 9.4 8.4*    CBC:  Recent Labs Lab 04/29/15 1826 05/01/15 0307  WBC 10.7* 16.6*  HGB 14.9 13.6  HCT 42.7 39.3  MCV 85.1 87.1  PLT 190 156    Liver Function Tests:  Recent Labs Lab 04/30/15 0420 04/30/15 0750 05/01/15 0307  AST 677* 584* 184*  ALT 534* 555* 359*  ALKPHOS 248* 273* 281*  BILITOT 6.6* 7.1* 8.4*  PROT 7.9 7.9  6.4*  ALBUMIN 3.8 3.8 3.0*    Recent Labs Lab 04/30/15 0420 04/30/15 0750  LIPASE 20* 22   Coags:  Recent Labs Lab 05/01/15 0307  INR 1.26   CBG:  Recent Labs Lab 04/30/15 2048 05/01/15 0801 05/01/15 1104 05/01/15 1240 05/01/15 1613  GLUCAP 166* 162* 166* 179* 262*    Recent Results (from the past 240 hour(s))  Culture, blood (routine x 2)     Status: None (Preliminary result)   Collection Time: 04/30/15  7:50 AM   Result Value Ref Range Status   Specimen Description BLOOD RIGHT ARM  Final   Special Requests BOTTLES DRAWN AEROBIC AND ANAEROBIC 10CC  Final   Culture NO GROWTH 1 DAY  Final   Report Status PENDING  Incomplete  Culture, blood (routine x 2)     Status: None (Preliminary result)   Collection Time: 04/30/15  8:00 AM  Result Value Ref Range Status   Specimen Description BLOOD LEFT ARM  Final   Special Requests BOTTLES DRAWN AEROBIC AND ANAEROBIC 10CC  Final   Culture NO GROWTH 1 DAY  Final   Report Status PENDING  Incomplete  MRSA PCR Screening     Status: None   Collection Time: 04/30/15 10:00 AM  Result Value Ref Range Status   MRSA by PCR NEGATIVE NEGATIVE Final    Comment:        The GeneXpert MRSA Assay (FDA approved for NASAL specimens only), is one component of a comprehensive MRSA colonization surveillance program. It is not intended to diagnose MRSA infection nor to guide or monitor treatment for MRSA infections.   Culture, Urine     Status: None   Collection Time: 04/30/15 11:20 AM  Result Value Ref Range Status   Specimen Description URINE, CATHETERIZED  Final   Special Requests NONE  Final   Culture 5,000 COLONIES/mL INSIGNIFICANT GROWTH  Final   Report Status 05/01/2015 FINAL  Final     Studies:   Recent x-ray studies have been reviewed in detail by the Attending Physician  Scheduled Meds:  Scheduled Meds: . enoxaparin (LOVENOX) injection  40 mg Subcutaneous Q24H  . HYDROmorphone      . HYDROmorphone      . insulin aspart  0-9 Units Subcutaneous TID WC  . piperacillin-tazobactam (ZOSYN)  IV  3.375 g Intravenous Q8H    Time spent on care of this patient: 35 mins   Jerusalem Wert T , MD   [facility] Office  [phone] Pager - Text Page per Loretha Stapler as per below:  On-Call/Text Page:      [email or web address]      password [email or web address]  If 7PM-7AM, please contact night-coverage [email or web address] Password [email or web address] 05/01/2015, 5:02 PM   LOS: 1 day

## 2015-05-01 NOTE — Anesthesia Procedure Notes (Signed)
Procedure Name: Intubation Date/Time: 05/01/2015 9:44 AM Performed by: Jerilee HohMUMM, Kenijah Benningfield N Pre-anesthesia Checklist: Patient identified, Emergency Drugs available, Suction available and Patient being monitored Patient Re-evaluated:Patient Re-evaluated prior to inductionOxygen Delivery Method: Circle system utilized Preoxygenation: Pre-oxygenation with 100% oxygen Intubation Type: IV induction Ventilation: Mask ventilation without difficulty Laryngoscope Size: Mac and 4 Grade View: Grade II Tube type: Oral Tube size: 7.0 mm Number of attempts: 1 Airway Equipment and Method: Stylet Placement Confirmation: ETT inserted through vocal cords under direct vision,  positive ETCO2 and breath sounds checked- equal and bilateral Secured at: 22 cm Tube secured with: Tape Dental Injury: Teeth and Oropharynx as per pre-operative assessment

## 2015-05-01 NOTE — Op Note (Signed)
04/29/2015 - 05/01/2015  10:53 AM  PATIENT:  John Meza  54 y.o. male  PRE-OPERATIVE DIAGNOSIS:  Cholecystitis  POST-OPERATIVE DIAGNOSIS:  Cholecystitis  PROCEDURE:  Procedure(s): LAPAROSCOPIC CHOLECYSTECTOMY  SURGEON:  Surgeon(s): Violeta GelinasBurke Jannae Fagerstrom, MD  ASSISTANTS: none   ANESTHESIA:   local and general  EBL:  Total I/O In: 1000 [I.V.:1000] Out: 400 [Urine:400]  BLOOD ADMINISTERED:none  DRAINS: none   SPECIMEN:  Excision  DISPOSITION OF SPECIMEN:  PATHOLOGY  COUNTS:  YES  DICTATION: .Dragon Dictation Felicia is brought for cholecystectomy. He was identified in the preop holding area. Informed consent was obtained. He is on IV antibiotic protocol. He was brought to the operating room and general endotracheal anesthesia was administered by the anesthesia staff. His abdomen was prepped and draped in sterile fashion. We did a time out procedure.The infraumbilical region was infiltrated with local. Infraumbilical incision was made. Subcutaneous tissues were dissected down revealing the anterior fascia. This was divided sharply along the midline. Peritoneal cavity was entered under direct vision without complication. A 0 Vicryl pursestring was placed around the fascial opening. Hassan trocar was inserted into the abdomen. The abdomen was insufflated with carbon dioxide in standard fashion. Under direct vision a 5 mm epigastric and 2 5 mm lateral ports placed using local at the site. Laparoscopic exploration revealed a distended gallbladder. The dome was retracted superior medially. The infundibulum was retracted inferior laterally. Dissection began laterally and progressed medially. This identified an anterior branch of the cystic artery. This was clipped twice proximally once distally and divided. Further dissection revealed the main cystic artery which was clipped twice proximally once distally and divided. This allowed further dissection of the cystic duct. The common bile duct and  cystic duct junction was identified. The cystic duct was extremely short. Careful dissection was used and the length was only several millimeters. Therefore, we could not do a cholangiogram unfortunately. Once we had a critical view, 4 clips were placed proximally on the cystic duct and it was divided. Gallbladder was taken off the liver bed with Bovie cautery. We achieved hemostasis gradually along the way. Gallbladder was placed in a bag and removed from the infra umbilical port site. Liver bed was then cauterized further to get good hemostasis. We placed a piece of Surgicel snow in the bed. In the area was irrigated clips remain in good position in the liver bed appeared hemostatic. Remainder irrigation was evacuated. Ports were removed under direct vision. Pneumoperitoneum was released. Informed local fascia was closed by tying the pursestring. All 4 wounds were copiously irrigated and the skin of each was closed with running 4 Vicryl subcuticular followed by Dermabond. All counts were correct. Patient tolerated procedure well without apparent complication was taken recovery in stable condition.  PATIENT DISPOSITION:  PACU - guarded condition.   Delay start of Pharmacological VTE agent (>24hrs) due to surgical blood loss or risk of bleeding:  no  Violeta GelinasBurke Ebrima Ranta, MD, MPH, FACS Pager: [phone]  7/20/201610:53 AM

## 2015-05-01 NOTE — Progress Notes (Signed)
Patient ID: John Meza, male   DOB: [birth year], 54 y.o.   MRN: [id number]     [facility]      [address] [place], [place] [address]    Phone: [phone] FAX: [phone]     Subjective: Family at bedside.  Pt tachycardic.  Afebrile.  WBC up.   Objective:  Vital signs:  Filed Vitals:   05/01/15 0100 05/01/15 0300 05/01/15 0400 05/01/15 0800  BP: 157/94 157/92 160/86 158/94  Pulse: 115 115 111 121  Temp:   98.3 F (36.8 C) 99.1 F (37.3 C)  TempSrc:   Oral Oral  Resp: '20 22 24 18  ' Height:      Weight:      SpO2: 95% 95% 95% 95%       Intake/Output   Yesterday:  07/19 0701 - 07/20 0700 In: 3150 [I.V.:3000; IV Piggyback:150] Out: 2575 [Urine:2575] This shift:    I/O last 3 completed shifts: In: 3200 [I.V.:3050; IV Piggyback:150] Out: 2575 [Urine:2575]   Physical Exam: General: Pt awake/alert/oriented x4 in no acute distress Abdomen: Soft.  Nondistended.  Non tender.  No evidence of peritonitis.  No incarcerated hernias.    Problem List:   Active Problems:   Obstructive jaundice   Sepsis   Cholecystitis    Results:   Labs: Results for orders placed or performed during the hospital encounter of 04/29/15 (from the past 48 hour(s))  Basic metabolic panel     Status: Abnormal   Collection Time: 04/29/15  6:26 PM  Result Value Ref Range   Sodium 131 (L) 135 - 145 mmol/L   Potassium 3.9 3.5 - 5.1 mmol/L   Chloride 95 (L) 101 - 111 mmol/L   CO2 23 22 - 32 mmol/L   Glucose, Bld 382 (H) 65 - 99 mg/dL   BUN 10 6 - 20 mg/dL   Creatinine, Ser 0.74 0.61 - 1.24 mg/dL   Calcium 9.4 8.9 - 10.3 mg/dL   GFR calc non Af Amer >60 >60 mL/min   GFR calc Af Amer >60 >60 mL/min    Comment: (NOTE) The eGFR has been calculated using the CKD EPI equation. This calculation has not been validated in all clinical situations. eGFR's persistently <60 mL/min signify possible Chronic Kidney Disease.    Anion gap  13 5 - 15  CBC     Status: Abnormal   Collection Time: 04/29/15  6:26 PM  Result Value Ref Range   WBC 10.7 (H) 4.0 - 10.5 K/uL   RBC 5.02 4.22 - 5.81 MIL/uL   Hemoglobin 14.9 13.0 - 17.0 g/dL   HCT 42.7 39.0 - 52.0 %   MCV 85.1 78.0 - 100.0 fL   MCH 29.7 26.0 - 34.0 pg   MCHC 34.9 30.0 - 36.0 g/dL   RDW 13.7 11.5 - 15.5 %   Platelets 190 150 - 400 K/uL  I-stat troponin, ED     Status: None   Collection Time: 04/29/15  6:33 PM  Result Value Ref Range   Troponin i, poc 0.00 0.00 - 0.08 ng/mL   Comment 3            Comment: Due to the release kinetics of cTnI, a negative result within the first hours of the onset of symptoms does not rule out myocardial infarction with certainty. If myocardial infarction is still suspected, repeat the test at appropriate intervals.   Hepatic function panel     Status: Abnormal   Collection Time:  04/30/15  4:20 AM  Result Value Ref Range   Total Protein 7.9 6.5 - 8.1 g/dL   Albumin 3.8 3.5 - 5.0 g/dL   AST 677 (H) 15 - 41 U/L   ALT 534 (H) 17 - 63 U/L   Alkaline Phosphatase 248 (H) 38 - 126 U/L   Total Bilirubin 6.6 (H) 0.3 - 1.2 mg/dL   Bilirubin, Direct 4.0 (H) 0.1 - 0.5 mg/dL   Indirect Bilirubin 2.6 (H) 0.3 - 0.9 mg/dL  Lipase, blood     Status: Abnormal   Collection Time: 04/30/15  4:20 AM  Result Value Ref Range   Lipase 20 (L) 22 - 51 U/L  Lipase, blood     Status: None   Collection Time: 04/30/15  7:50 AM  Result Value Ref Range   Lipase 22 22 - 51 U/L  Hepatic function panel     Status: Abnormal   Collection Time: 04/30/15  7:50 AM  Result Value Ref Range   Total Protein 7.9 6.5 - 8.1 g/dL   Albumin 3.8 3.5 - 5.0 g/dL   AST 584 (H) 15 - 41 U/L   ALT 555 (H) 17 - 63 U/L   Alkaline Phosphatase 273 (H) 38 - 126 U/L   Total Bilirubin 7.1 (H) 0.3 - 1.2 mg/dL   Bilirubin, Direct 4.7 (H) 0.1 - 0.5 mg/dL   Indirect Bilirubin 2.4 (H) 0.3 - 0.9 mg/dL  Lactic acid, plasma     Status: None   Collection Time: 04/30/15  7:50 AM   Result Value Ref Range   Lactic Acid, Venous 1.1 0.5 - 2.0 mmol/L  Procalcitonin - Baseline     Status: None   Collection Time: 04/30/15  7:50 AM  Result Value Ref Range   Procalcitonin 0.26 ng/mL    Comment:        Interpretation: PCT (Procalcitonin) <= 0.5 ng/mL: Systemic infection (sepsis) is not likely. Local bacterial infection is possible. (NOTE)         ICU PCT Algorithm               Non ICU PCT Algorithm    ----------------------------     ------------------------------         PCT < 0.25 ng/mL                 PCT < 0.1 ng/mL     Stopping of antibiotics            Stopping of antibiotics       strongly encouraged.               strongly encouraged.    ----------------------------     ------------------------------       PCT level decrease by               PCT < 0.25 ng/mL       >= 80% from peak PCT       OR PCT 0.25 - 0.5 ng/mL          Stopping of antibiotics                                             encouraged.     Stopping of antibiotics           encouraged.    ----------------------------     ------------------------------       PCT  level decrease by              PCT >= 0.25 ng/mL       < 80% from peak PCT        AND PCT >= 0.5 ng/mL            Continuin g antibiotics                                              encouraged.       Continuing antibiotics            encouraged.    ----------------------------     ------------------------------     PCT level increase compared          PCT > 0.5 ng/mL         with peak PCT AND          PCT >= 0.5 ng/mL             Escalation of antibiotics                                          strongly encouraged.      Escalation of antibiotics        strongly encouraged.   MRSA PCR Screening     Status: None   Collection Time: 04/30/15 10:00 AM  Result Value Ref Range   MRSA by PCR NEGATIVE NEGATIVE    Comment:        The GeneXpert MRSA Assay (FDA approved for NASAL specimens only), is one component of a comprehensive  MRSA colonization surveillance program. It is not intended to diagnose MRSA infection nor to guide or monitor treatment for MRSA infections.   Urinalysis, Routine w reflex microscopic (not at [facility])     Status: Abnormal   Collection Time: 04/30/15 11:20 AM  Result Value Ref Range   Color, Urine AMBER (A) YELLOW    Comment: BIOCHEMICALS MAY BE AFFECTED BY COLOR   APPearance CLOUDY (A) CLEAR   Specific Gravity, Urine 1.031 (H) 1.005 - 1.030   pH 5.5 5.0 - 8.0   Glucose, UA >1000 (A) NEGATIVE mg/dL   Hgb urine dipstick MODERATE (A) NEGATIVE   Bilirubin Urine LARGE (A) NEGATIVE   Ketones, ur >80 (A) NEGATIVE mg/dL   Protein, ur 100 (A) NEGATIVE mg/dL   Urobilinogen, UA 1.0 0.0 - 1.0 mg/dL   Nitrite POSITIVE (A) NEGATIVE   Leukocytes, UA TRACE (A) NEGATIVE  Urine microscopic-add on     Status: Abnormal   Collection Time: 04/30/15 11:20 AM  Result Value Ref Range   Squamous Epithelial / LPF RARE RARE   WBC, UA 21-50 <3 WBC/hpf   RBC / HPF 3-6 <3 RBC/hpf   Bacteria, UA FEW (A) RARE   Casts GRANULAR CAST (A) NEGATIVE  Glucose, capillary     Status: Abnormal   Collection Time: 04/30/15 11:56 AM  Result Value Ref Range   Glucose-Capillary 224 (H) 65 - 99 mg/dL   Comment 1 Notify RN   Hepatitis panel, acute     Status: None   Collection Time: 04/30/15  2:50 PM  Result Value Ref Range   Hepatitis B Surface Ag Negative Negative   HCV Ab <0.1 0.0 - 0.9 s/co ratio  Comment: (NOTE)                                  Negative:     < 0.8                             Indeterminate: 0.8 - 0.9                                  Positive:     > 0.9 The CDC recommends that a positive HCV antibody result be followed up with a HCV Nucleic Acid Amplification test ([id number]). Performed At: [facility] [place], [place] [id number] Lindon Romp MD EZ:[id number]    Hep A IgM Negative Negative   Hep B C IgM Negative Negative  Glucose, capillary     Status: Abnormal    Collection Time: 04/30/15  4:03 PM  Result Value Ref Range   Glucose-Capillary 180 (H) 65 - 99 mg/dL  Glucose, capillary     Status: Abnormal   Collection Time: 04/30/15  8:48 PM  Result Value Ref Range   Glucose-Capillary 166 (H) 65 - 99 mg/dL  Comprehensive metabolic panel     Status: Abnormal   Collection Time: 05/01/15  3:07 AM  Result Value Ref Range   Sodium 129 (L) 135 - 145 mmol/L   Potassium 3.4 (L) 3.5 - 5.1 mmol/L   Chloride 97 (L) 101 - 111 mmol/L   CO2 16 (L) 22 - 32 mmol/L   Glucose, Bld 184 (H) 65 - 99 mg/dL   BUN <5 (L) 6 - 20 mg/dL   Creatinine, Ser 0.62 0.61 - 1.24 mg/dL   Calcium 8.4 (L) 8.9 - 10.3 mg/dL   Total Protein 6.4 (L) 6.5 - 8.1 g/dL   Albumin 3.0 (L) 3.5 - 5.0 g/dL   AST 184 (H) 15 - 41 U/L   ALT 359 (H) 17 - 63 U/L   Alkaline Phosphatase 281 (H) 38 - 126 U/L   Total Bilirubin 8.4 (H) 0.3 - 1.2 mg/dL   GFR calc non Af Amer >60 >60 mL/min   GFR calc Af Amer >60 >60 mL/min    Comment: (NOTE) The eGFR has been calculated using the CKD EPI equation. This calculation has not been validated in all clinical situations. eGFR's persistently <60 mL/min signify possible Chronic Kidney Disease.    Anion gap 16 (H) 5 - 15  CBC     Status: Abnormal   Collection Time: 05/01/15  3:07 AM  Result Value Ref Range   WBC 16.6 (H) 4.0 - 10.5 K/uL   RBC 4.51 4.22 - 5.81 MIL/uL   Hemoglobin 13.6 13.0 - 17.0 g/dL   HCT 39.3 39.0 - 52.0 %   MCV 87.1 78.0 - 100.0 fL   MCH 30.2 26.0 - 34.0 pg   MCHC 34.6 30.0 - 36.0 g/dL   RDW 14.2 11.5 - 15.5 %   Platelets 156 150 - 400 K/uL  Protime-INR     Status: Abnormal   Collection Time: 05/01/15  3:07 AM  Result Value Ref Range   Prothrombin Time 15.9 (H) 11.6 - 15.2 seconds   INR 1.26 0.00 - 1.49  Glucose, capillary     Status: Abnormal   Collection Time: 05/01/15  8:01 AM  Result Value Ref Range  Glucose-Capillary 162 (H) 65 - 99 mg/dL    Imaging / Studies: Dg Chest 2 View  04/29/2015   CLINICAL DATA:  Shortness  of breath, chest and back pain for 3 days.  EXAM: CHEST  2 VIEW  COMPARISON:  December 04, 2011  FINDINGS: The heart size and mediastinal contours are within normal limits. There is no focal infiltrate, pulmonary edema, or pleural effusion. Spinal rods are unchanged. There is scoliosis of spine.  IMPRESSION: No active cardiopulmonary disease.   Electronically Signed   By: Abelardo Diesel M.D.   On: 04/29/2015 18:51   US Abdomen Complete  04/30/2015   CLINICAL DATA:  Chest and abdominal pain for 2 days. History of diabetes, hypertension.  EXAM: ULTRASOUND ABDOMEN COMPLETE  COMPARISON:  CT chest, abdomen and pelvis April 29, 2015  FINDINGS: Gallbladder: Echogenic layering gallbladder sludge in addition to punctate echogenic gallstones. Gallbladder wall thickening at 8 mm. No sonographic Murphy's sign elicited.  Common bile duct: Diameter: 4 mm  Liver: Echogenic heterogeneous liver without intrahepatic biliary dilatation. Hepatopetal portal vein. Hepatopetal portal vein.  IVC: No abnormality visualized.  Pancreas: Visualized portion unremarkable.  Spleen: Size and appearance within normal limits.  Right Kidney: Length: 12.1 cm. Echogenicity within normal limits. No mass or hydronephrosis visualized.  Left Kidney: Length: 12 cm. Echogenicity within normal limits. No mass or hydronephrosis visualized.  Abdominal aorta: No aneurysm visualized.  Other findings: None.  IMPRESSION: Sonographic findings of cholecystitis though, no sonographic Murphy's sign elicited.  Heterogeneous echogenic liver can be seen with hepatocellular disease/steatosis.   Electronically Signed   By: Elon Alas M.D.   On: 04/30/2015 03:44   Ct Angio Chest Aorta W/cm &/or Wo/cm  04/30/2015   CLINICAL DATA:  Acute onset of generalized chest and abdominal pain. Initial encounter.  EXAM: CT ANGIOGRAPHY CHEST, ABDOMEN AND PELVIS  TECHNIQUE: Multidetector CT imaging through the chest, abdomen and pelvis was performed using the standard protocol  during bolus administration of intravenous contrast. Multiplanar reconstructed images and MIPs were obtained and reviewed to evaluate the vascular anatomy.  CONTRAST:  160m OMNIPAQUE IOHEXOL 350 MG/ML SOLN  COMPARISON:  CTA of the chest performed 11/17/2011, and CTA of the abdomen and pelvis from 11/16/2011  FINDINGS: CTA CHEST FINDINGS  There is no evidence of aortic dissection. There is no evidence of aneurysmal dilatation. No significant calcific atherosclerotic disease is noted along the thoracic aorta.  There is no evidence of pulmonary embolus.  Mild bibasilar atelectasis is noted. The lungs are otherwise clear. There is no evidence of pleural effusion or pneumothorax. No masses are identified; no abnormal focal contrast enhancement is seen.  The mediastinum is unremarkable in appearance. No mediastinal lymphadenopathy is seen. No pericardial effusion is identified. The great vessels are grossly unremarkable in appearance. No axillary lymphadenopathy is seen. The thyroid gland is unremarkable in appearance.  No acute osseous abnormalities are seen.  Review of the MIP images confirms the above findings.  CTA ABDOMEN AND PELVIS FINDINGS  There is no evidence of aortic dissection. There is no evidence of aneurysmal dilatation. No calcific atherosclerotic disease is seen. The celiac trunk, superior mesenteric artery, bilateral renal arteries and inferior mesenteric artery are unremarkable in appearance. The inferior vena cava is within normal limits.  Mild soft tissue inflammation is noted about the gallbladder, concerning for mild cholecystitis. Would correlate for associated symptoms.  Heterogeneity of the liver may reflect underlying venous congestion. The spleen is unremarkable in appearance. The pancreas and adrenal glands are unremarkable.  Mild  nonspecific perinephric stranding is noted bilaterally. The kidneys are otherwise unremarkable. There is no evidence of hydronephrosis. No renal or ureteral stones  are seen.  No free fluid is identified. The small bowel is unremarkable in appearance. The stomach is within normal limits. No acute vascular abnormalities are seen.  The appendix is normal caliber, without evidence of appendicitis. The colon is unremarkable in appearance.  The bladder is moderately distended and grossly unremarkable. Mild cystic change is noted at the right side of the prostate. This is somewhat more prominent than in 2013. The prostate remains normal in size. No inguinal lymphadenopathy is seen. A small left inguinal hernia is noted, containing only fat.  No acute osseous abnormalities are identified. There is mild chronic compression deformity at T11, with underlying lumbar spinal fusion at T9-L1.  Review of the MIP images confirms the above findings.  IMPRESSION: 1. No evidence of aortic dissection. No evidence of aneurysmal dilatation. No calcific atherosclerotic disease seen. 2. No evidence of pulmonary embolus. 3. Mild soft tissue inflammation about the gallbladder, raising concern for mild cholecystitis. Would correlate for associated symptoms. 4. Heterogeneity of the liver may reflect underlying venous congestion. 5. Mild bibasilar atelectasis noted; lungs otherwise clear. 6. Mild cystic change at the right side of the prostate. This is somewhat more prominent than in 2013. Would correlate with PSA. 7. Small left inguinal hernia, containing only fat. 8. Mild chronic compression deformity at T11, with underlying postoperative change.   Electronically Signed   By: Garald Balding M.D.   On: 04/30/2015 01:03   Ct Angio Abd/pel W/ And/or W/o  04/30/2015   CLINICAL DATA:  Acute onset of generalized chest and abdominal pain. Initial encounter.  EXAM: CT ANGIOGRAPHY CHEST, ABDOMEN AND PELVIS  TECHNIQUE: Multidetector CT imaging through the chest, abdomen and pelvis was performed using the standard protocol during bolus administration of intravenous contrast. Multiplanar reconstructed images and  MIPs were obtained and reviewed to evaluate the vascular anatomy.  CONTRAST:  168m OMNIPAQUE IOHEXOL 350 MG/ML SOLN  COMPARISON:  CTA of the chest performed 11/17/2011, and CTA of the abdomen and pelvis from 11/16/2011  FINDINGS: CTA CHEST FINDINGS  There is no evidence of aortic dissection. There is no evidence of aneurysmal dilatation. No significant calcific atherosclerotic disease is noted along the thoracic aorta.  There is no evidence of pulmonary embolus.  Mild bibasilar atelectasis is noted. The lungs are otherwise clear. There is no evidence of pleural effusion or pneumothorax. No masses are identified; no abnormal focal contrast enhancement is seen.  The mediastinum is unremarkable in appearance. No mediastinal lymphadenopathy is seen. No pericardial effusion is identified. The great vessels are grossly unremarkable in appearance. No axillary lymphadenopathy is seen. The thyroid gland is unremarkable in appearance.  No acute osseous abnormalities are seen.  Review of the MIP images confirms the above findings.  CTA ABDOMEN AND PELVIS FINDINGS  There is no evidence of aortic dissection. There is no evidence of aneurysmal dilatation. No calcific atherosclerotic disease is seen. The celiac trunk, superior mesenteric artery, bilateral renal arteries and inferior mesenteric artery are unremarkable in appearance. The inferior vena cava is within normal limits.  Mild soft tissue inflammation is noted about the gallbladder, concerning for mild cholecystitis. Would correlate for associated symptoms.  Heterogeneity of the liver may reflect underlying venous congestion. The spleen is unremarkable in appearance. The pancreas and adrenal glands are unremarkable.  Mild nonspecific perinephric stranding is noted bilaterally. The kidneys are otherwise unremarkable. There is no evidence of  hydronephrosis. No renal or ureteral stones are seen.  No free fluid is identified. The small bowel is unremarkable in appearance. The  stomach is within normal limits. No acute vascular abnormalities are seen.  The appendix is normal caliber, without evidence of appendicitis. The colon is unremarkable in appearance.  The bladder is moderately distended and grossly unremarkable. Mild cystic change is noted at the right side of the prostate. This is somewhat more prominent than in 2013. The prostate remains normal in size. No inguinal lymphadenopathy is seen. A small left inguinal hernia is noted, containing only fat.  No acute osseous abnormalities are identified. There is mild chronic compression deformity at T11, with underlying lumbar spinal fusion at T9-L1.  Review of the MIP images confirms the above findings.  IMPRESSION: 1. No evidence of aortic dissection. No evidence of aneurysmal dilatation. No calcific atherosclerotic disease seen. 2. No evidence of pulmonary embolus. 3. Mild soft tissue inflammation about the gallbladder, raising concern for mild cholecystitis. Would correlate for associated symptoms. 4. Heterogeneity of the liver may reflect underlying venous congestion. 5. Mild bibasilar atelectasis noted; lungs otherwise clear. 6. Mild cystic change at the right side of the prostate. This is somewhat more prominent than in 2013. Would correlate with PSA. 7. Small left inguinal hernia, containing only fat. 8. Mild chronic compression deformity at T11, with underlying postoperative change.   Electronically Signed   By: Garald Balding M.D.   On: 04/30/2015 01:03    Medications / Allergies:  Scheduled Meds: . enoxaparin (LOVENOX) injection  40 mg Subcutaneous Q24H  . insulin aspart  0-9 Units Subcutaneous TID WC  . piperacillin-tazobactam (ZOSYN)  IV  3.375 g Intravenous Q8H   Continuous Infusions: . sodium chloride 0.9 % 1,000 mL infusion 125 mL/hr at 05/01/15 0405   PRN Meds:.acetaminophen **OR** acetaminophen, cyclobenzaprine, hydrALAZINE, HYDROcodone-acetaminophen, morphine injection, ondansetron **OR** ondansetron  (ZOFRAN) IV  Antibiotics: Anti-infectives    Start     Dose/Rate Route Frequency Ordered Stop   04/30/15 1200  piperacillin-tazobactam (ZOSYN) IVPB 3.375 g     3.375 g 12.5 mL/hr over 240 Minutes Intravenous Every 8 hours 04/30/15 0744     04/30/15 0500  piperacillin-tazobactam (ZOSYN) IVPB 3.375 g     3.375 g 100 mL/hr over 30 Minutes Intravenous  Once 04/30/15 0447 04/30/15 0531        Assessment/Plan Acute cholecystitis sepsis Abnormal LFTs, T bili continuing to increase -laparoscopic cholecystectomy with IOC this AM -consent signed ID-[email or web address] D#[id number] VTE prophylaxis-SCD, lovenox last give 7/19 2334 FEN-NPO, IVF, Na low.  K low, add K to IVF Dispo-to [place], [facility] Pager [phone]) For consults and floor pages call [phone](7A-4:30P)  05/01/2015 8:27 AM

## 2015-05-01 NOTE — Progress Notes (Signed)
Post lap chole, IOC not possible due to short cystic duct. We will be available for ERCP  If indicated.

## 2015-05-01 NOTE — Progress Notes (Signed)
Day of Surgery  Subjective: Epigastric pain  Objective: Vital signs in last 24 hours: Temp:  [97.5 F (36.4 C)-99.4 F (37.4 C)] 99.1 F (37.3 C) (07/20 0800) Pulse Rate:  [106-121] 121 (07/20 0800) Resp:  [17-27] 18 (07/20 0800) BP: (123-169)/(64-94) 158/94 mmHg (07/20 0800) SpO2:  [94 %-97 %] 95 % (07/20 0800)    Intake/Output from previous day: 07/19 0701 - 07/20 0700 In: 3150 [I.V.:3000; IV Piggyback:150] Out: 2575 [Urine:2575] Intake/Output this shift:    General appearance: cooperative Resp: clear to auscultation bilaterally Cardio: regular rate and rhythm  Abd: mild epig tenderness  Lab Results:   Recent Labs  04/29/15 1826 05/01/15 0307  WBC 10.7* 16.6*  HGB 14.9 13.6  HCT 42.7 39.3  PLT 190 156   BMET  Recent Labs  04/29/15 1826 05/01/15 0307  NA 131* 129*  K 3.9 3.4*  CL 95* 97*  CO2 23 16*  GLUCOSE 382* 184*  BUN 10 <5*  CREATININE 0.74 0.62  CALCIUM 9.4 8.4*   PT/INR  Recent Labs  05/01/15 0307  LABPROT 15.9*  INR 1.26     Anti-infectives: Anti-infectives    Start     Dose/Rate Route Frequency Ordered Stop   04/30/15 1200  [MAR Hold]  piperacillin-tazobactam (ZOSYN) IVPB 3.375 g     (MAR Hold since 05/01/15 0857)   3.375 g 12.5 mL/hr over 240 Minutes Intravenous Every 8 hours 04/30/15 0744     04/30/15 0500  piperacillin-tazobactam (ZOSYN) IVPB 3.375 g     3.375 g 100 mL/hr over 30 Minutes Intravenous  Once 04/30/15 0447 04/30/15 0531      Assessment/Plan: Cholecystitis, possible choledocholithiasis - to OR for lap chole/IOC this AM. Consent obtained yesterday. Procedure again discussed with him in [place]. Questions answered.  LOS: 1 day    Fowler Antos E 05/01/2015

## 2015-05-02 ENCOUNTER — Encounter (HOSPITAL_COMMUNITY): Payer: Self-pay | Admitting: General Surgery

## 2015-05-02 DIAGNOSIS — K838 Other specified diseases of biliary tract: Secondary | ICD-10-CM

## 2015-05-02 DIAGNOSIS — K810 Acute cholecystitis: Secondary | ICD-10-CM | POA: Diagnosis present

## 2015-05-02 DIAGNOSIS — R932 Abnormal findings on diagnostic imaging of liver and biliary tract: Secondary | ICD-10-CM

## 2015-05-02 LAB — COMPREHENSIVE METABOLIC PANEL
ALT: 229 U/L — ABNORMAL HIGH (ref 17–63)
ANION GAP: 11 (ref 5–15)
AST: 106 U/L — ABNORMAL HIGH (ref 15–41)
Albumin: 2.3 g/dL — ABNORMAL LOW (ref 3.5–5.0)
Alkaline Phosphatase: 250 U/L — ABNORMAL HIGH (ref 38–126)
BUN: <5 mg/dL — ABNORMAL LOW (ref 6–20)
CHLORIDE: 103 mmol/L (ref 101–111)
CO2: 17 mmol/L — AB (ref 22–32)
CREATININE: 0.56 mg/dL — AB (ref 0.61–1.24)
Calcium: 8.1 mg/dL — ABNORMAL LOW (ref 8.9–10.3)
GFR calc Af Amer: >60 mL/min (ref >60)
Glucose, Bld: 228 mg/dL — ABNORMAL HIGH (ref 65–99)
Potassium: 3.9 mmol/L (ref 3.5–5.1)
SODIUM: 131 mmol/L — AB (ref 135–145)
Total Bilirubin: 7.8 mg/dL — ABNORMAL HIGH (ref 0.3–1.2)
Total Protein: 5.7 g/dL — ABNORMAL LOW (ref 6.5–8.1)

## 2015-05-02 LAB — CBC
HEMATOCRIT: 34.1 % — AB (ref 39.0–52.0)
Hemoglobin: 11.7 g/dL — ABNORMAL LOW (ref 13.0–17.0)
MCH: 29.8 pg (ref 26.0–34.0)
MCHC: 34.3 g/dL (ref 30.0–36.0)
MCV: 87 fL (ref 78.0–100.0)
Platelets: 166 10*3/uL (ref 150–400)
RBC: 3.92 MIL/uL — ABNORMAL LOW (ref 4.22–5.81)
RDW: 14.6 % (ref 11.5–15.5)
WBC: 7.8 10*3/uL (ref 4.0–10.5)

## 2015-05-02 LAB — GLUCOSE, CAPILLARY
GLUCOSE-CAPILLARY: 253 mg/dL — AB (ref 65–99)
Glucose-Capillary: 193 mg/dL — ABNORMAL HIGH (ref 65–99)
Glucose-Capillary: 178 mg/dL — ABNORMAL HIGH (ref 65–99)
Glucose-Capillary: 247 mg/dL — ABNORMAL HIGH (ref 65–99)

## 2015-05-02 LAB — PROCALCITONIN: Procalcitonin: 1.1 ng/mL

## 2015-05-02 MED ORDER — MAGNESIUM SULFATE 2 GM/50ML IV SOLN
2.0000 g | Freq: Once | INTRAVENOUS | Status: AC
  Administered 2015-05-02: 2 g via INTRAVENOUS
  Filled 2015-05-02: qty 50

## 2015-05-02 NOTE — Progress Notes (Signed)
TEAM 1 - Stepdown/ICU TEAM Progress Note  John Meza ZOX:[id number] DOB: 30-Apr-1961 DOA: 04/29/2015 PCP: No PCP Per Patient  Admit HPI / Brief Narrative: 54 y.o. male with Hx significant for HTN and Diabetes who presented complaining of sharp constant abdominal pain after meals that started 4 days prior to admission.   Evaluation in the ED: Alkaline phosphatase 248, AST 677, ALT 534, bili 6.6, Abdominal US; sign of cholecystitis, CT angio chest abdomen: no PE, no dissection, gallbladder with evidence of cholecystitis.   HPI/Subjective: The patient continues to have some modest pain in the right upper quadrant.  There's been no vomiting.  The patient denies chest pain or shortness of breath.  He appears to be reasonably comfortable at the time of my exam.  Assessment/Plan:  Acute cholecystitis with possible choledocholithiasis Care as per General Surgery and GI - intraoperative cholangiogram not able to be accomplished due to very short cystic duct - may yet require ERCP as Tbil remains elevated   Sinus tachycardia v/s other SVT It appears the patient is suffering with simple sinus tachycardia related to his relapsing fever and his acute illness, as well as pain - he is not in distress and his respiratory status remains stable - we will follow his heart rate closely in the SDU - his heart rate has improved today - continue to follow in step down unit - hydrate - TSH is normal  Hypomagnesemia Likely due to poor intake and GI loss - replace and follow  Sepsis due to biliary infection Continue volume resuscitation and follow tachycardia - hemodynamically stable at present with normal lactic acid  DM CBG improving - follow trend  Hx spinal cord injury with paraplegia w/ neurogenic bladder   Hyponatremia  Due to hypovolemia - slowly improving with volume resuscitation - follow  HTN Blood pressure currently well controlled  Code Status: FULL Family Communication:  Wife present at time of exam Disposition Plan: SDU  Consultants: [facility] General Surgery  Procedures: 7/20 lap choly  Antibiotics: Zosyn 7/18 >  DVT prophylaxis: Lovenox  Objective: Blood pressure 112/73, pulse 109, temperature 98.3 F (36.8 C), temperature source Oral, resp. rate 16, height  (1.702 m), weight 86.183 kg (190 lb), SpO2 100 %.  Intake/Output Summary (Last 24 hours) at 05/02/15 1016 Last data filed at 05/02/15 0900  Gross per 24 hour  Intake 3693.75 ml  Output   3400 ml  Net 293.75 ml   Exam: General: No acute respiratory distress - conversant Lungs: Clear to auscultation bilaterally without wheezes / crackles Cardiovascular: Tachycardic at 109 bpm but regular without appreciable rub murmur Abdomen: Mildly tender in right upper quadrant, soft, bowel sounds hypoactive, no rebound, no ascites, no appreciable mass Extremities: No significant cyanosis, clubbing, edema bilateral lower extremities  Data Reviewed: Basic Metabolic Panel:  Recent Labs Lab 04/29/15 1826 05/01/15 0307 05/01/15 1948 05/02/15 0536  NA 131* 129* 131* 131*  K 3.9 3.4* 3.4* 3.9  CL 95* 97* 103 103  CO2 23 16* 19* 17*  GLUCOSE 382* 184* 295* 228*  BUN 10 <5* <5* <5*  CREATININE 0.74 0.62 0.60* 0.56*  CALCIUM 9.4 8.4* 8.2* 8.1*  MG  --   --  1.4*  --     CBC:  Recent Labs Lab 04/29/15 1826 05/01/15 0307 05/01/15 1948 05/02/15 0536  WBC 10.7* 16.6* 9.0 7.8  HGB 14.9 13.6 12.0* 11.7*  HCT 42.7 39.3 35.1* 34.1*  MCV 85.1 87.1 86.5 87.0  PLT 190 156 175 166  Liver Function Tests:  Recent Labs Lab 04/30/15 0420 04/30/15 0750 05/01/15 0307 05/01/15 1948 05/02/15 0536  AST 677* 584* 184* 167* 106*  ALT 534* 555* 359* 290* 229*  ALKPHOS 248* 273* 281* 288* 250*  BILITOT 6.6* 7.1* 8.4* 7.5* 7.8*  PROT 7.9 7.9 6.4* 6.2* 5.7*  ALBUMIN 3.8 3.8 3.0* 2.5* 2.3*    Recent Labs Lab 04/30/15 0420 04/30/15 0750  LIPASE 20* 22   Coags:  Recent Labs Lab  05/01/15 0307  INR 1.26   CBG:  Recent Labs Lab 05/01/15 1104 05/01/15 1240 05/01/15 1613 05/01/15 2132 05/02/15 0759  GLUCAP 166* 179* 262* 245* 193*    Recent Results (from the past 240 hour(s))  Culture, blood (routine x 2)     Status: None (Preliminary result)   Collection Time: 04/30/15  7:50 AM  Result Value Ref Range Status   Specimen Description BLOOD RIGHT ARM  Final   Special Requests BOTTLES DRAWN AEROBIC AND ANAEROBIC 10CC  Final   Culture NO GROWTH 1 DAY  Final   Report Status PENDING  Incomplete  Culture, blood (routine x 2)     Status: None (Preliminary result)   Collection Time: 04/30/15  8:00 AM  Result Value Ref Range Status   Specimen Description BLOOD LEFT ARM  Final   Special Requests BOTTLES DRAWN AEROBIC AND ANAEROBIC 10CC  Final   Culture NO GROWTH 1 DAY  Final   Report Status PENDING  Incomplete  MRSA PCR Screening     Status: None   Collection Time: 04/30/15 10:00 AM  Result Value Ref Range Status   MRSA by PCR NEGATIVE NEGATIVE Final    Comment:        The GeneXpert MRSA Assay (FDA approved for NASAL specimens only), is one component of a comprehensive MRSA colonization surveillance program. It is not intended to diagnose MRSA infection nor to guide or monitor treatment for MRSA infections.   Culture, Urine     Status: None   Collection Time: 04/30/15 11:20 AM  Result Value Ref Range Status   Specimen Description URINE, CATHETERIZED  Final   Special Requests NONE  Final   Culture 5,000 COLONIES/mL INSIGNIFICANT GROWTH  Final   Report Status 05/01/2015 FINAL  Final     Studies:   Recent x-ray studies have been reviewed in detail by the Attending Physician  Scheduled Meds:  Scheduled Meds: . enoxaparin (LOVENOX) injection  40 mg Subcutaneous Q24H  . insulin aspart  0-9 Units Subcutaneous TID WC  . piperacillin-tazobactam (ZOSYN)  IV  3.375 g Intravenous Q8H    Time spent on care of this patient: 35 mins   [name],JEFFREY T  , MD   [facility] Office  [phone] Pager - Text Page per Loretha Stapler as per below:  On-Call/Text Page:      [email or web address]      password [email or web address]  If 7PM-7AM, please contact night-coverage [email or web address] Password [email or web address] 05/02/2015, 10:16 AM   LOS: 2 days

## 2015-05-02 NOTE — Progress Notes (Signed)
1 Day Post-Op  Subjective: Mild pain  Objective: Vital signs in last 24 hours: Temp:  [98.3 F (36.8 C)-100.7 F (38.2 C)] 99.8 F (37.7 C) (07/21 0400) Pulse Rate:  [108-143] 109 (07/21 0700) Resp:  [16-28] 16 (07/21 0700) BP: (105-169)/(59-98) 112/73 mmHg (07/21 0700) SpO2:  [96 %-100 %] 100 % (07/21 0700)    Intake/Output from previous day: 07/20 0701 - 07/21 0700 In: 4393.8 [I.V.:4293.8; IV Piggyback:100] Out: 3800 [Urine:3750; Blood:50] Intake/Output this shift:    General appearance: cooperative Resp: clear to auscultation bilaterally GI: soft, incisions CDI  Lab Results:   Recent Labs  05/01/15 1948 05/02/15 0536  WBC 9.0 7.8  HGB 12.0* 11.7*  HCT 35.1* 34.1*  PLT 175 166   BMET  Recent Labs  05/01/15 1948 05/02/15 0536  NA 131* 131*  K 3.4* 3.9  CL 103 103  CO2 19* 17*  GLUCOSE 295* 228*  BUN <5* <5*  CREATININE 0.60* 0.56*  CALCIUM 8.2* 8.1*   PT/INR  Recent Labs  05/01/15 0307  LABPROT 15.9*  INR 1.26   ABG No results for input(s): PHART, HCO3 in the last 72 hours.  Invalid input(s): PCO2, PO2  Studies/Results: No results found.  Anti-infectives: Anti-infectives    Start     Dose/Rate Route Frequency Ordered Stop   04/30/15 1200  piperacillin-tazobactam (ZOSYN) IVPB 3.375 g     3.375 g 12.5 mL/hr over 240 Minutes Intravenous Every 8 hours 04/30/15 0744     04/30/15 0500  piperacillin-tazobactam (ZOSYN) IVPB 3.375 g     3.375 g 100 mL/hr over 30 Minutes Intravenous  Once 04/30/15 0447 04/30/15 0531      Assessment/Plan: s/p Procedure(s): LAPAROSCOPIC CHOLECYSTECTOMY WITH ATTEMPTED INTRAOPERATIVE CHOLANGIOGRAM (N/A) POD#1 Could not do cholangiogram due to very short cystic duct Bili remains elevated - GI following for consideration of ERCP I spoke with him in Spanish  LOS: 2 days    John Meza E 05/02/2015

## 2015-05-02 NOTE — Progress Notes (Signed)
CSW informed that pt family would like to discuss emergency Medicaid- CSW met with family to discuss and provided Medicaid application/ [place].  Patient/family did not have any further questions or CSW needs.  CSW signing off- please reconsult if further needs arise  Domenica Reamer, [place] Social Worker [phone]

## 2015-05-02 NOTE — Progress Notes (Signed)
Daily Rounding Note  05/02/2015, 2:44 PM  LOS: 2 days   SUBJECTIVE:       Surgery calling GI back since the T bili still elevated 24 hours after  Lap chole.  Due to short cystic duct, surgeon unable to perform IOC.    Some tachycardia to 1teens/120s. Pt having some pain, tenderness in RUQ/right mid abd "medium" but only took 2 mg morphine at 0400 and 1030 today. No nausea, appetite and po intake depressed but tolerating clears  OBJECTIVE:         Vital signs in last 24 hours:    Temp:  [98.3 F (36.8 C)-100.7 F (38.2 C)] 98.3 F (36.8 C) (07/21 0700) Pulse Rate:  [106-143] 110 (07/21 1000) Resp:  [16-28] 19 (07/21 1000) BP: (105-160)/(59-86) 111/73 mmHg (07/21 1000) SpO2:  [97 %-100 %] 99 % (07/21 1000)   Filed Weights   04/29/15 1940  Weight: 190 lb (86.183 kg)   General: pleasant, comfortable.  Jaundiced.   Heart: RR, tachy to 110.   Chest: clear bil. No labored breathing Abdomen: tender on right over surgical region.  Incisions C/D/I.  BS present but hypoactive.   Extremities:  No CCE Neuro/Psych:  Pleasant, fully alert, appropriate  Intake/Output from previous day: 07/20 0701 - 07/21 0700 In: 4393.8 [I.V.:4293.8; IV Piggyback:100] Out: 3800 [Urine:3750; Blood:50]  Intake/Output this shift: Total I/O In: 780 [P.O.:480; I.V.:300] Out: 850 [Urine:850]  Lab Results:  Recent Labs  05/01/15 0307 05/01/15 1948 05/02/15 0536  WBC 16.6* 9.0 7.8  HGB 13.6 12.0* 11.7*  HCT 39.3 35.1* 34.1*  PLT 156 175 166   BMET  Recent Labs  05/01/15 0307 05/01/15 1948 05/02/15 0536  NA 129* 131* 131*  K 3.4* 3.4* 3.9  CL 97* 103 103  CO2 16* 19* 17*  GLUCOSE 184* 295* 228*  BUN <5* <5* <5*  CREATININE 0.62 0.60* 0.56*  CALCIUM 8.4* 8.2* 8.1*   LFT  Recent Labs  04/30/15 0420 04/30/15 0750 05/01/15 0307 05/01/15 1948 05/02/15 0536  PROT 7.9 7.9 6.4* 6.2* 5.7*  ALBUMIN 3.8 3.8 3.0* 2.5* 2.3*  AST  677* 584* 184* 167* 106*  ALT 534* 555* 359* 290* 229*  ALKPHOS 248* 273* 281* 288* 250*  BILITOT 6.6* 7.1* 8.4* 7.5* 7.8*  BILIDIR 4.0* 4.7*  --   --   --   IBILI 2.6* 2.4*  --   --   --    PT/INR  Recent Labs  05/01/15 0307  LABPROT 15.9*  INR 1.26   Hepatitis Panel  Recent Labs  04/30/15 1450  HEPBSAG Negative  HCVAB <0.1  HEPAIGM Negative  HEPBIGM Negative    Studies/Results: No results found.   Scheduled Meds: . enoxaparin (LOVENOX) injection  40 mg Subcutaneous Q24H  . insulin aspart  0-9 Units Subcutaneous TID WC  . piperacillin-tazobactam (ZOSYN)  IV  3.375 g Intravenous Q8H   Continuous Infusions: . 0.9 % NaCl with KCl 20 mEq / L 125 mL/hr at 05/02/15 1209   PRN Meds:.acetaminophen **OR** acetaminophen, cyclobenzaprine, morphine injection, ondansetron **OR** ondansetron (ZOFRAN) IV, oxyCODONE, phenol   ASSESMENT:   *  ? Choledocholithiasis.  T bili further elevated, alp phos mildly improved, transaminases significantly inproved.  Clinically stable.  Remains on Zosyn.   *  1 day s/p lap chole.     PLAN   *  LFTs in AM If necessary, will not be able to perform ERCP until 7/22 LFTs in AM.  Jennye Moccasin  05/02/2015, 2:44 PM Pager: [phone] Attending MD note:   I have taken a history, examined the patient, and reviewed the chart. I agree with the Advanced Practitioner's impression and recommendations.Possible CBD stone as per  Abnormal IOC, currently pt appears comfortsble. Due to  Large number of ERCP procedures in  next 24 hours , will continue to observe LFT"s  And pain  Intensity. Consider ERCP this weekend. I have spoken to pt's family who speak [place].   [facility] Pager # [phone]

## 2015-05-02 NOTE — Anesthesia Postprocedure Evaluation (Signed)
  Anesthesia Post-op Note  Patient: John Meza  Procedure(s) Performed: Procedure(s): LAPAROSCOPIC CHOLECYSTECTOMY WITH ATTEMPTED INTRAOPERATIVE CHOLANGIOGRAM (N/A)  Patient Location: PACU  Anesthesia Type:General  Level of Consciousness: awake, alert  and oriented  Airway and Oxygen Therapy: Patient Spontanous Breathing and Patient connected to nasal cannula oxygen  Post-op Pain: mild  Post-op Assessment: Post-op Vital signs reviewed, Patient's Cardiovascular Status Stable, Respiratory Function Stable, Patent Airway and Pain level controlled LLE Motor Response: No movement to painful stimulus (paraplegic)   RLE Motor Response: No movement to painful stimulus (paraplegic)        Post-op Vital Signs: stable  Last Vitals:  Filed Vitals:   05/02/15 2000  BP:   Pulse:   Temp: 36.7 C  Resp:     Complications: No apparent anesthesia complications

## 2015-05-02 NOTE — Clinical Documentation Improvement (Signed)
Please identify any clinical conditions associated with the abnormal potassium values and document in your future progress notes and carry over to the discharge summary.  Potassium 3.4 on 05/01/15.  Potassium Chloride SA 40 mEq once ordered 01/30/15.   Possible Clinical Conditions: -Hypokalemia -Other condition (please specify) -Unable to determine at present  Thank you, Doy Mince, RN [phone] Clinical Documentation Specialist

## 2015-05-03 LAB — COMPREHENSIVE METABOLIC PANEL
ALT: 172 U/L — ABNORMAL HIGH (ref 17–63)
AST: 67 U/L — ABNORMAL HIGH (ref 15–41)
Albumin: 2.1 g/dL — ABNORMAL LOW (ref 3.5–5.0)
Alkaline Phosphatase: 239 U/L — ABNORMAL HIGH (ref 38–126)
Anion gap: 9 (ref 5–15)
BILIRUBIN TOTAL: 5.5 mg/dL — AB (ref 0.3–1.2)
BUN: <5 mg/dL — ABNORMAL LOW (ref 6–20)
CALCIUM: 8.1 mg/dL — AB (ref 8.9–10.3)
CO2: 25 mmol/L (ref 22–32)
Chloride: 99 mmol/L — ABNORMAL LOW (ref 101–111)
Creatinine, Ser: 0.49 mg/dL — ABNORMAL LOW (ref 0.61–1.24)
GFR calc non Af Amer: >60 mL/min (ref >60)
Glucose, Bld: 235 mg/dL — ABNORMAL HIGH (ref 65–99)
POTASSIUM: 3.5 mmol/L (ref 3.5–5.1)
Sodium: 133 mmol/L — ABNORMAL LOW (ref 135–145)
Total Protein: 5.9 g/dL — ABNORMAL LOW (ref 6.5–8.1)

## 2015-05-03 LAB — CBC
HCT: 33.8 % — ABNORMAL LOW (ref 39.0–52.0)
Hemoglobin: 11.6 g/dL — ABNORMAL LOW (ref 13.0–17.0)
MCH: 29.6 pg (ref 26.0–34.0)
MCHC: 34.3 g/dL (ref 30.0–36.0)
MCV: 86.2 fL (ref 78.0–100.0)
Platelets: 176 10*3/uL (ref 150–400)
RBC: 3.92 MIL/uL — ABNORMAL LOW (ref 4.22–5.81)
RDW: 14.4 % (ref 11.5–15.5)
WBC: 5.9 10*3/uL (ref 4.0–10.5)

## 2015-05-03 LAB — GLUCOSE, CAPILLARY
GLUCOSE-CAPILLARY: 240 mg/dL — AB (ref 65–99)
Glucose-Capillary: 187 mg/dL — ABNORMAL HIGH (ref 65–99)
Glucose-Capillary: 210 mg/dL — ABNORMAL HIGH (ref 65–99)
Glucose-Capillary: 275 mg/dL — ABNORMAL HIGH (ref 65–99)

## 2015-05-03 LAB — MAGNESIUM: MAGNESIUM: 1.7 mg/dL (ref 1.7–2.4)

## 2015-05-03 MED ORDER — INSULIN GLARGINE 100 UNIT/ML ~~LOC~~ SOLN
12.0000 [IU] | Freq: Every day | SUBCUTANEOUS | Status: DC
  Administered 2015-05-03: 12 [IU] via SUBCUTANEOUS
  Filled 2015-05-03 (×2): qty 0.12

## 2015-05-03 MED ORDER — PANTOPRAZOLE SODIUM 40 MG PO TBEC
40.0000 mg | DELAYED_RELEASE_TABLET | Freq: Every day | ORAL | Status: DC
  Administered 2015-05-03 – 2015-05-06 (×4): 40 mg via ORAL
  Filled 2015-05-03 (×4): qty 1

## 2015-05-03 NOTE — Progress Notes (Signed)
2 Days Post-Op  Subjective: Doing well, tol clears  Objective: Vital signs in last 24 hours: Temp:  [98.1 F (36.7 C)-98.6 F (37 C)] 98.6 F (37 C) (07/22 0400) Pulse Rate:  [94-114] 98 (07/22 0400) Resp:  [15-26] 15 (07/22 0400) BP: (111-143)/(68-90) 118/74 mmHg (07/22 0400) SpO2:  [98 %-100 %] 100 % (07/22 0400) Last BM Date: 04/30/15  Intake/Output from previous day: 07/21 0701 - 07/22 0700 In: 3631.3 [P.O.:480; I.V.:3001.3; IV Piggyback:150] Out: 4475 [Urine:4475] Intake/Output this shift:    General appearance: alert and cooperative GI: soft, incisions CDI  Lab Results:   Recent Labs  05/02/15 0536 05/03/15 0250  WBC 7.8 5.9  HGB 11.7* 11.6*  HCT 34.1* 33.8*  PLT 166 176   BMET  Recent Labs  05/02/15 0536 05/03/15 0250  NA 131* 133*  K 3.9 3.5  CL 103 99*  CO2 17* 25  GLUCOSE 228* 235*  BUN <5* <5*  CREATININE 0.56* 0.49*  CALCIUM 8.1* 8.1*   PT/INR  Recent Labs  05/01/15 0307  LABPROT 15.9*  INR 1.26   ABG No results for input(s): PHART, HCO3 in the last 72 hours.  Invalid input(s): PCO2, PO2  Studies/Results: No results found.  Anti-infectives: Anti-infectives    Start     Dose/Rate Route Frequency Ordered Stop   04/30/15 1200  piperacillin-tazobactam (ZOSYN) IVPB 3.375 g     3.375 g 12.5 mL/hr over 240 Minutes Intravenous Every 8 hours 04/30/15 0744     04/30/15 0500  piperacillin-tazobactam (ZOSYN) IVPB 3.375 g     3.375 g 100 mL/hr over 30 Minutes Intravenous  Once 04/30/15 0447 04/30/15 0531      Assessment/Plan: s/p Procedure(s): LAPAROSCOPIC CHOLECYSTECTOMY WITH ATTEMPTED INTRAOPERATIVE CHOLANGIOGRAM (N/A) POD#2 Could not do cholangiogram due to very short cystic duct Bili down a bit - GI deciding regarding ERCP  LOS: 3 days    Ryiah Bellissimo E 05/03/2015

## 2015-05-03 NOTE — Progress Notes (Signed)
[facility] TEAM 1 - Stepdown/ICU TEAM Progress Note  Bobbi Kozakiewicz RUE:[id number] DOB: 1961-03-09 DOA: 04/29/2015 PCP: No PCP Per Patient  Admit HPI / Brief Narrative: 54 y.o. male with Hx significant for HTN and Diabetes who presented complaining of sharp constant abdominal pain after meals that started 4 days prior to admission.   Evaluation in the ED: Alkaline phosphatase 248, AST 677, ALT 534, bili 6.6, Abdominal US; sign of cholecystitis, CT angio chest abdomen: no PE, no dissection, gallbladder with evidence of cholecystitis.   HPI/Subjective: The patient reports that he is feeling better today.  He feels that his appetite is returning.  He states his pain is better controlled.  He denies vomiting shortness of breath or chest pain.  Assessment/Plan:  Acute cholecystitis with possible choledocholithiasis Care as per General Surgery and GI - intraoperative cholangiogram not able to be accomplished due to very short cystic duct - T bil not correcting very quickly and pt still symptomatic, though he is slowly improving - GI to consider ERCP for 7/23  Sinus tachycardia v/s other SVT Physiologic related to sepsis/infection - now resolved - TSH is normal  Hypomagnesemia due to poor intake and GI loss - improved w/ replacement   Hypokalemia Due to GI losses - improving w/ replacement  Sepsis due to biliary infection hemodynamically stable - sepsis has resolved   DM CBG not yet at goal - adjust tx and follow   Hx spinal cord injury with paraplegia w/ neurogenic bladder   Hyponatremia  Due to hypovolemia - steadily improving with volume resuscitation   HTN Blood pressure currently well controlled  Code Status: FULL Family Communication: Wife present at time of exam Disposition Plan: SDU until decision made on ERCP  Consultants: [facility] General Surgery  Procedures: 7/20 lap choly  Antibiotics: Zosyn 7/18 >  DVT prophylaxis: Lovenox  Objective: Blood  pressure 141/76, pulse 71, temperature 98.6 F (37 C), temperature source Oral, resp. rate 21, height 5\' 7"  (1.702 m), weight 86.183 kg (190 lb), SpO2 99 %.  Intake/Output Summary (Last 24 hours) at 05/03/15 0929 Last data filed at 05/03/15 0800  Gross per 24 hour  Intake 3101.25 ml  Output   4475 ml  Net -1373.75 ml   Exam: General: No acute respiratory distress - alert and pleasant  Lungs: Clear to auscultation bilaterally  Cardiovascular: RRR w/o M, G, R  Abdomen: Mildly tender in right upper quadrant to palpation, soft, bowel sounds noted, no rebound, no ascites, no appreciable mass Extremities: No significant cyanosis, clubbing, or edema bilateral lower extremities  Data Reviewed: Basic Metabolic Panel:  Recent Labs Lab 04/29/15 1826 05/01/15 0307 05/01/15 1948 05/02/15 0536 05/03/15 0250  NA 131* 129* 131* 131* 133*  K 3.9 3.4* 3.4* 3.9 3.5  CL 95* 97* 103 103 99*  CO2 23 16* 19* 17* 25  GLUCOSE 382* 184* 295* 228* 235*  BUN 10 <5* <5* <5* <5*  CREATININE 0.74 0.62 0.60* 0.56* 0.49*  CALCIUM 9.4 8.4* 8.2* 8.1* 8.1*  MG  --   --  1.4*  --  1.7    CBC:  Recent Labs Lab 04/29/15 1826 05/01/15 0307 05/01/15 1948 05/02/15 0536 05/03/15 0250  WBC 10.7* 16.6* 9.0 7.8 5.9  HGB 14.9 13.6 12.0* 11.7* 11.6*  HCT 42.7 39.3 35.1* 34.1* 33.8*  MCV 85.1 87.1 86.5 87.0 86.2  PLT 190 156 175 166 176    Liver Function Tests:  Recent Labs Lab 04/30/15 0750 05/01/15 0307 05/01/15 1948 05/02/15 0536 05/03/15 0250  AST 584* 184* 167* 106* 67*  ALT 555* 359* 290* 229* 172*  ALKPHOS 273* 281* 288* 250* 239*  BILITOT 7.1* 8.4* 7.5* 7.8* 5.5*  PROT 7.9 6.4* 6.2* 5.7* 5.9*  ALBUMIN 3.8 3.0* 2.5* 2.3* 2.1*    Recent Labs Lab 04/30/15 0420 04/30/15 0750  LIPASE 20* 22   Coags:  Recent Labs Lab 05/01/15 0307  INR 1.26   CBG:  Recent Labs Lab 05/02/15 0759 05/02/15 1225 05/02/15 1642 05/02/15 2148 05/03/15 0748  GLUCAP 193* 178* 247* 253* 187*     Recent Results (from the past 240 hour(s))  Culture, blood (routine x 2)     Status: None (Preliminary result)   Collection Time: 04/30/15  7:50 AM  Result Value Ref Range Status   Specimen Description BLOOD RIGHT ARM  Final   Special Requests BOTTLES DRAWN AEROBIC AND ANAEROBIC 10CC  Final   Culture NO GROWTH 2 DAYS  Final   Report Status PENDING  Incomplete  Culture, blood (routine x 2)     Status: None (Preliminary result)   Collection Time: 04/30/15  8:00 AM  Result Value Ref Range Status   Specimen Description BLOOD LEFT ARM  Final   Special Requests BOTTLES DRAWN AEROBIC AND ANAEROBIC 10CC  Final   Culture NO GROWTH 2 DAYS  Final   Report Status PENDING  Incomplete  MRSA PCR Screening     Status: None   Collection Time: 04/30/15 10:00 AM  Result Value Ref Range Status   MRSA by PCR NEGATIVE NEGATIVE Final    Comment:        The GeneXpert MRSA Assay (FDA approved for NASAL specimens only), is one component of a comprehensive MRSA colonization surveillance program. It is not intended to diagnose MRSA infection nor to guide or monitor treatment for MRSA infections.   Culture, Urine     Status: None   Collection Time: 04/30/15 11:20 AM  Result Value Ref Range Status   Specimen Description URINE, CATHETERIZED  Final   Special Requests NONE  Final   Culture 5,000 COLONIES/mL INSIGNIFICANT GROWTH  Final   Report Status 05/01/2015 FINAL  Final     Studies:   Recent x-ray studies have been reviewed in detail by the Attending Physician  Scheduled Meds:  Scheduled Meds: . enoxaparin (LOVENOX) injection  40 mg Subcutaneous Q24H  . insulin aspart  0-9 Units Subcutaneous TID WC  . pantoprazole  40 mg Oral Q0600  . piperacillin-tazobactam (ZOSYN)  IV  3.375 g Intravenous Q8H    Time spent on care of this patient: 25 mins   [place] [facility] , MD   [facility] Office  [phone] Pager - Text Page per Loretha Stapler as per below:  On-Call/Text Page:       [email or web address]      password [email or web address]  If 7PM-7AM, please contact night-coverage [email or web address] Password [email or web address] 05/03/2015, 9:29 AM   LOS: 3 days

## 2015-05-03 NOTE — Progress Notes (Signed)
Daily Rounding Note  05/03/2015, 8:16 AM  LOS: 3 days   SUBJECTIVE:       continues to tolerate clears but not taking po vigorously.  However says he is hungry. Last Morphine for 3/10 pain at 2146 last night.  Denies pain currently.    OBJECTIVE:         Vital signs in last 24 hours:    Temp:  [98.1 F (36.7 C)-98.6 F (37 C)] 98.6 F (37 C) (07/22 0749) Pulse Rate:  [71-114] 71 (07/22 0749) Resp:  [15-26] 21 (07/22 0749) BP: (111-143)/(68-90) 141/76 mmHg (07/22 0749) SpO2:  [98 %-100 %] 99 % (07/22 0749) Last BM Date: 04/30/15 Filed Weights   04/29/15 1940  Weight: 190 lb (86.183 kg)   General: NAD, still jaundiced though his natural skin pigment masks detection of this   Heart: RRR.  No MRG Chest: clear bil.  No dyspnea or cough Abdomen: soft, hypoactive BS but no tinkling.  ND.  Tender without guarding over surgical area in right abdomen.   Extremities: no CCE Neuro/Psych:  Pleasant, cooperative, alert, no focal deficits or lethargy.   Intake/Output from previous day: 07/21 0701 - 07/22 0700 In: 3631.3 [P.O.:480; I.V.:3001.3; IV Piggyback:150] Out: 4475 [Urine:4475]  Intake/Output this shift:    Lab Results:  Recent Labs  05/01/15 1948 05/02/15 0536 05/03/15 0250  WBC 9.0 7.8 5.9  HGB 12.0* 11.7* 11.6*  HCT 35.1* 34.1* 33.8*  PLT 175 166 176   BMET  Recent Labs  05/01/15 1948 05/02/15 0536 05/03/15 0250  NA 131* 131* 133*  K 3.4* 3.9 3.5  CL 103 103 99*  CO2 19* 17* 25  GLUCOSE 295* 228* 235*  BUN <5* <5* <5*  CREATININE 0.60* 0.56* 0.49*  CALCIUM 8.2* 8.1* 8.1*   LFT  Recent Labs  05/01/15 1948 05/02/15 0536 05/03/15 0250  PROT 6.2* 5.7* 5.9*  ALBUMIN 2.5* 2.3* 2.1*  AST 167* 106* 67*  ALT 290* 229* 172*  ALKPHOS 288* 250* 239*  BILITOT 7.5* 7.8* 5.5*   PT/INR  Recent Labs  05/01/15 0307  LABPROT 15.9*  INR 1.26   Hepatitis Panel  Recent Labs  04/30/15 1450    HEPBSAG Negative  HCVAB <0.1  HEPAIGM Negative  HEPBIGM Negative    Studies/Results: No results found.   Scheduled Meds: . enoxaparin (LOVENOX) injection  40 mg Subcutaneous Q24H  . insulin aspart  0-9 Units Subcutaneous TID WC  . piperacillin-tazobactam (ZOSYN)  IV  3.375 g Intravenous Q8H   Continuous Infusions: . 0.9 % NaCl with KCl 20 mEq / L 125 mL/hr at 05/02/15 2147   PRN Meds:.acetaminophen **OR** acetaminophen, cyclobenzaprine, morphine injection, ondansetron **OR** ondansetron (ZOFRAN) IV, oxyCODONE, phenol   ASSESMENT:    * ? Choledocholithiasis.preop ultrasound with 4 mm CBD, no CBD stones.  Unable to get IOC due to short cystic duct.  LFTs steadily trending down. Hepatitis serologies negative.  Clinically stable. Remains on Zosyn. ? If the rods in his spine are contraindication to MR study?   * 2 day s/p lap chole.Unable to get IOC due to short cystic duct.   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 Urine culture with 5 K/insignificant growth.  Wheelchair bound at [name]    PLAN   *  Observe and AM CMET.  Advance to carb mod diet.  Up to chair if possible, normal activity is wheelchair to bed. .  *  Remains on  Zosyn, consider stopping in next 24 hours if LFTs continue downward trend.  *  Restart PPI, on Dexilant at home.     Jennye Moccasin  05/03/2015, 8:16 AM Pager: [phone] Attending MD note:   I have taken a history, examined the patient, and reviewed the chart. I agree with the Advanced Practitioner's impression and recommendations. He is having post surgical pain, bilirubin slightly down to 5.5,  The enzymes trending down.Have spoken with wife- will need to wait and see if he needs an ERCP during the weekend.  [facility] Pager # [phone]

## 2015-05-03 NOTE — Progress Notes (Signed)
Inpatient Diabetes Program Recommendations  AACE/ADA: New Consensus Statement on Inpatient Glycemic Control (2013)  Target Ranges:  Prepandial:   less than 140 mg/dL      Peak postprandial:   less than 180 mg/dL (1-2 hours)      Critically ill patients:  140 - 180 mg/dL   Results for DOYL, BITTING (MRN [id number]) as of 05/03/2015 07:45  Ref. Range 05/02/2015 07:59 05/02/2015 12:25 05/02/2015 16:42 05/02/2015 21:48  Glucose-Capillary Latest Ref Range: 65-99 mg/dL 086 (H) 578 (H) 469 (H) 253 (H)   Results for MATTHIEU, LOFTUS (MRN [id number]) as of 05/03/2015 07:45  Ref. Range 05/03/2015 02:50  Glucose Latest Ref Range: 65-99 mg/dL 244 (H)    Admit with: Cholecystitis  History: DM  Home DM Meds: Metformin 850 mg tidwc  Current DM Orders: Novolog Sensitive SSI (0-9 units) TID AC    -POD #2. Tolerating CL diet.  -Having both elevated fasting and postprandial glucose levels.    MD- While patient's home dose of Metformin is on hold, please consider adding low dose basal insulin to in-hospital insulin regimen-  Lantus 12 units daily (0.15 units/kg dosing)     Will follow Ambrose Finland RN, MSN, CDE Diabetes Coordinator Inpatient Glycemic Control Team Team Pager: [phone] (8a-5p)

## 2015-05-04 DIAGNOSIS — I10 Essential (primary) hypertension: Secondary | ICD-10-CM

## 2015-05-04 DIAGNOSIS — K819 Cholecystitis, unspecified: Secondary | ICD-10-CM

## 2015-05-04 DIAGNOSIS — I471 Supraventricular tachycardia: Secondary | ICD-10-CM

## 2015-05-04 DIAGNOSIS — E1165 Type 2 diabetes mellitus with hyperglycemia: Secondary | ICD-10-CM

## 2015-05-04 DIAGNOSIS — E871 Hypo-osmolality and hyponatremia: Secondary | ICD-10-CM

## 2015-05-04 DIAGNOSIS — A419 Sepsis, unspecified organism: Secondary | ICD-10-CM

## 2015-05-04 LAB — COMPREHENSIVE METABOLIC PANEL
ALBUMIN: 2.1 g/dL — AB (ref 3.5–5.0)
ALK PHOS: 248 U/L — AB (ref 38–126)
ALT: 122 U/L — AB (ref 17–63)
AST: 38 U/L (ref 15–41)
Anion gap: 9 (ref 5–15)
BILIRUBIN TOTAL: 2.6 mg/dL — AB (ref 0.3–1.2)
CALCIUM: 7.9 mg/dL — AB (ref 8.9–10.3)
CO2: 23 mmol/L (ref 22–32)
CREATININE: 0.43 mg/dL — AB (ref 0.61–1.24)
Chloride: 98 mmol/L — ABNORMAL LOW (ref 101–111)
Glucose, Bld: 250 mg/dL — ABNORMAL HIGH (ref 65–99)
POTASSIUM: 3.4 mmol/L — AB (ref 3.5–5.1)
Sodium: 130 mmol/L — ABNORMAL LOW (ref 135–145)
TOTAL PROTEIN: 5.9 g/dL — AB (ref 6.5–8.1)

## 2015-05-04 LAB — CBC
HEMATOCRIT: 33.3 % — AB (ref 39.0–52.0)
HEMOGLOBIN: 11.5 g/dL — AB (ref 13.0–17.0)
MCH: 29.9 pg (ref 26.0–34.0)
MCHC: 34.5 g/dL (ref 30.0–36.0)
MCV: 86.7 fL (ref 78.0–100.0)
PLATELETS: 206 10*3/uL (ref 150–400)
RBC: 3.84 MIL/uL — ABNORMAL LOW (ref 4.22–5.81)
RDW: 14.4 % (ref 11.5–15.5)
WBC: 6.1 10*3/uL (ref 4.0–10.5)

## 2015-05-04 LAB — GLUCOSE, CAPILLARY
GLUCOSE-CAPILLARY: 298 mg/dL — AB (ref 65–99)
Glucose-Capillary: 228 mg/dL — ABNORMAL HIGH (ref 65–99)
Glucose-Capillary: 214 mg/dL — ABNORMAL HIGH (ref 65–99)
Glucose-Capillary: 274 mg/dL — ABNORMAL HIGH (ref 65–99)

## 2015-05-04 MED ORDER — INSULIN ASPART 100 UNIT/ML ~~LOC~~ SOLN
0.0000 [IU] | SUBCUTANEOUS | Status: DC
  Administered 2015-05-04: 8 [IU] via SUBCUTANEOUS
  Administered 2015-05-04 (×2): 5 [IU] via SUBCUTANEOUS
  Administered 2015-05-04: 8 [IU] via SUBCUTANEOUS
  Administered 2015-05-05: 5 [IU] via SUBCUTANEOUS
  Administered 2015-05-05: 8 [IU] via SUBCUTANEOUS
  Administered 2015-05-05: 5 [IU] via SUBCUTANEOUS
  Administered 2015-05-05 (×2): 3 [IU] via SUBCUTANEOUS
  Administered 2015-05-05: 8 [IU] via SUBCUTANEOUS
  Administered 2015-05-06 (×3): 3 [IU] via SUBCUTANEOUS

## 2015-05-04 MED ORDER — INSULIN GLARGINE 100 UNIT/ML ~~LOC~~ SOLN
18.0000 [IU] | Freq: Every day | SUBCUTANEOUS | Status: DC
  Administered 2015-05-04: 18 [IU] via SUBCUTANEOUS
  Filled 2015-05-04 (×2): qty 0.18

## 2015-05-04 NOTE — Progress Notes (Signed)
3 Days Post-Op  Subjective: More comfortable this morning  Objective: Vital signs in last 24 hours: Temp:  [97.8 F (36.6 C)-98.3 F (36.8 C)] 97.8 F (36.6 C) (07/23 0806) Pulse Rate:  [79-93] 82 (07/23 0806) Resp:  [16-29] 16 (07/23 0806) BP: (112-166)/(53-80) 120/68 mmHg (07/23 0806) SpO2:  [97 %-100 %] 98 % (07/23 0806) Last BM Date: 05/03/15  Intake/Output from previous day: 07/22 0701 - 07/23 0700 In: 1627.5 [I.V.:1477.5; IV Piggyback:150] Out: 4575 [Urine:4575] Intake/Output this shift:    Abdomen soft, minimally tender, incisions clean  Lab Results:   Recent Labs  05/03/15 0250 05/04/15 0246  WBC 5.9 6.1  HGB 11.6* 11.5*  HCT 33.8* 33.3*  PLT 176 206   BMET  Recent Labs  05/03/15 0250 05/04/15 0246  NA 133* 130*  K 3.5 3.4*  CL 99* 98*  CO2 25 23  GLUCOSE 235* 250*  BUN <5* <5*  CREATININE 0.49* 0.43*  CALCIUM 8.1* 7.9*   PT/INR No results for input(s): LABPROT, INR in the last 72 hours. ABG No results for input(s): PHART, HCO3 in the last 72 hours.  Invalid input(s): PCO2, PO2  Studies/Results: No results found.  Anti-infectives: Anti-infectives    Start     Dose/Rate Route Frequency Ordered Stop   04/30/15 1200  piperacillin-tazobactam (ZOSYN) IVPB 3.375 g     3.375 g 12.5 mL/hr over 240 Minutes Intravenous Every 8 hours 04/30/15 0744     04/30/15 0500  piperacillin-tazobactam (ZOSYN) IVPB 3.375 g     3.375 g 100 mL/hr over 30 Minutes Intravenous  Once 04/30/15 0447 04/30/15 0531      Assessment/Plan: s/p Procedure(s): LAPAROSCOPIC CHOLECYSTECTOMY WITH ATTEMPTED INTRAOPERATIVE CHOLANGIOGRAM (N/A)  LFT's trending down. Will advance po Nothing further for surgery to do at this point.  Can f/u with Dr. Janee Morn at [facility] as outpt  LOS: 4 days    Kyrus Hyde A 05/04/2015

## 2015-05-04 NOTE — Discharge Instructions (Signed)
Dolor abdominal (Abdominal Pain) El dolor puede tener muchas causas. Normalmente la causa del dolor abdominal no es una enfermedad y Scientist, clinical (histocompatibility and immunogenetics) sin TEFL teacher. Frecuentemente puede controlarse y tratarse en casa. Su mdico le Medical sales representative examen fsico y posiblemente solicite anlisis de sangre y radiografas para ayudar a Chief Strategy Officer la gravedad de su dolor. Sin embargo, en [employer], debe transcurrir ms tiempo antes de que se pueda Clinical research associate una causa evidente del dolor. Antes de llegar a ese punto, es posible que su mdico no sepa si necesita ms pruebas o un tratamiento ms profundo. INSTRUCCIONES PARA EL CUIDADO EN EL HOGAR  Est atento al dolor para ver si hay cambios. Las siguientes indicaciones ayudarn a Architectural technologist que pueda sentir:  John Meza solo medicamentos de venta libre o recetados, segn las indicaciones del mdico.  No tome laxantes a menos que se lo haya indicado su mdico.  Pruebe con John Meza dieta lquida absoluta (caldo, t o agua) segn se lo indique su mdico. Introduzca gradualmente una dieta normal, segn su tolerancia. SOLICITE ATENCIN MDICA SI:  Tiene dolor abdominal sin explicacin.  Tiene dolor abdominal relacionado con nuseas o diarrea.  Tiene dolor cuando orina o defeca.  Experimenta dolor abdominal que lo despierta de noche.  Tiene dolor abdominal que empeora o mejora cuando come alimentos.  Tiene dolor abdominal que empeora cuando come alimentos grasosos.  Tiene fiebre. SOLICITE ATENCIN MDICA DE INMEDIATO SI:   El dolor no desaparece en un plazo mximo de 2horas.  No deja de (vomitar).  El Engineer, mining se siente solo en partes del abdomen, como el lado derecho o la parte inferior izquierda del abdomen.  Evaca materia fecal sanguinolenta o negra, de aspecto alquitranado. ASEGRESE DE QUE:  Comprende estas instrucciones.  Controlar su afeccin.  Recibir ayuda de inmediato si no mejora o si empeora. Document Released: 09/28/2005  Document Revised: 10/03/2013 [facility] Patient Information 2015 [place], [place]. This information is not intended to replace advice given to you by your health care provider. Make sure you discuss any questions you have with your health care provider.  Emergency Department Resource Guide 1) Find a Doctor and Pay Out of Pocket Although you won't have to find out who is covered by your insurance plan, it is a good idea to ask around and get recommendations. You will then need to call the office and see if the doctor you have chosen will accept you as a new patient and what types of options they offer for patients who are self-pay. Some doctors offer discounts or will set up payment plans for their patients who do not have insurance, but you will need to ask so you aren't surprised when you get to your appointment.  2) Contact Your Local Health Department Not all health departments have doctors that can see patients for sick visits, but many do, so it is worth a call to see if yours does. If you don't know where your local health department is, you can check in your phone book. The CDC also has a tool to help you locate your state's health department, and many state websites also have listings of all of their local health departments.  3) Find a Walk-in Clinic If your illness is not likely to be very severe or complicated, you may want to try a walk in clinic. These are popping up all over the country in pharmacies, drugstores, and shopping centers. They're usually staffed by nurse practitioners or physician assistants that have been trained to treat common illnesses and  complaints. They're usually fairly quick and inexpensive. However, if you have serious medical issues or chronic medical problems, these are probably not your best option.  No Primary Care Doctor: - Call Health Connect at  [phone] - they can help you locate a primary care doctor that  accepts your insurance, provides certain services,  etc. - Physician Referral Service- [phone]  Chronic Pain Problems: Organization         Address  Phone   Notes  [facility]  [phone] Patients need to be referred by their primary care doctor.   Medication Assistance: Organization         Address  Phone   Notes  [facility] Medication [facility] [address] [place] [place], [place] [address] [phone] --Must be a resident of [facility] -- Must have NO insurance coverage whatsoever (no Medicaid/ Medicare, etc.) -- The pt. MUST have a primary care doctor that directs their care regularly and follows them in the community   [facility]  [phone]   [employer]  [phone]    Agencies that provide inexpensive medical care: Organization         Address  Phone   Notes  [facility]  [phone]   [facility]    [phone]   [facility] [address] [place], [place] [address] [phone]   [facility] [address] [place] [place] [phone]   [facility]anned Parenthood    [phone]   [facility]    [phone]   [facility]  [address], [place] Phone:  [phone], Fax:  [phone] Hours of Operation:  9 am - 6 pm, M-F.  Also accepts Medicaid/Medicare and self-pay.  [facility]  [address], [place] Phone: [phone], Fax: [phone]. Hours of Operation:  8:30 am - 5:30 pm, M-F.  Also accepts Medicaid and self-pay.  [facility] [place] [address], [place] Phone: [phone]   [facility] [address] [place], [place] [phone], Ext. [phone] Mondays & Thursdays: 7-9 AM.  First 15 patients are seen on a first come, first serve basis.    Medicaid-accepting [facility] Providers:  Organization         Address  Phone   Notes  [facility] [address], [place] [phone] Also accepts self-pay patients.  [facility] [address] Laurell Josephs [place], [place]  [phone]   [facility] [address], [place] [phone]   [facility] [address] [place] [phone]   Renaye Rakers [address], [place]   [phone] Only accepts [place] Access [place] patients after they have their name applied to their card.   Self-Pay (no insurance) in [facility]:  Organization         Address  Phone   Notes  Sickle Cell Patients, [facility] Internal Medicine [address] [place], [place] [phone]   [facility] [address] [place], [place] [phone]   [facility]  [address], [place] [phone]   [facility]/Dr. Osei-Bonsu  [address] [place] or [address], [place] (  [phone] Phone number for both [facility] and [place] locations is the same.  [facility] [address], [place] [phone]   [facility] [address] [place] or [address] [phone]   [facility] [address], [place]egino Ramirez [phone], phone; [phone], fax Sees patients 1st and 3rd Saturday of every month.  Must not qualify for public or private insurance (i.e. Medicaid, Medicare, [place] Health Choice, Veterans' Benefits)  Household income should be no more than 200% of the poverty level The clinic cannot treat you if you are pregnant or think you are pregnant  Sexually transmitted diseases are not treated at the clinic.    Dental Care: Organization         Address  Phone  Notes  [facility] [address] [place], [place] [phone] Accepts children up to  age 64 who are enrolled in [place] or [place] Health Choice; pregnant women with a Medicaid card; and children who have applied for Medicaid or [place] Health Choice, but were declined, whose parents can pay a reduced fee at time of service.  [facility]  [address], [place] [phone] Accepts children up to age 53 who are enrolled in [place] or [place] Health Choice; pregnant women with a Medicaid card; and children who have applied for Medicaid or [place] Health Choice, but were declined, whose parents can pay a reduced fee at time of service.  [facility]  [address] [place], [place] [phone] Patients are seen by appointment only. Walk-ins are not accepted. [facility] will see patients 60 years of age and older. Monday - Tuesday (8am-5pm) Most Wednesdays (8:30-5pm) $30 per visit, cash only  [facility]  [address], [facility] [phone] Patients are seen by appointment only. Walk-ins are not accepted. [facility] will see patients 74 years of age and older. One Wednesday Evening (Monthly: Volunteer Based).  $30 per visit, cash only  [employer]  [phone] for adults; Children under age 29, call Graduate Pediatric Dentistry at [phone]. Children aged 22-14, please call [phone] to request a pediatric application.  Dental services are provided in all areas of dental care including fillings, crowns and bridges, complete and partial dentures, implants, gum treatment, root canals, and extractions. Preventive care is also provided. Treatment is provided to both adults and children. Patients are selected via a lottery and there is often a waiting list.   [facility] [address], [place]  [phone] [email or web address]   [facility] [address] [place], [place] [phone], Ext. [phone] Second and Fourth Thursday of  each month, opens at 6:30 AM; Clinic ends at 9 AM.  Patients are seen on a first-come first-served basis, and a limited number are seen during each clinic.   [facility]  [address] Ether Griffins [place], [place] [phone]   Eligibility Requirements You must have lived in [place], [place], or [place] counties for at least the last three months.   You cannot be eligible for state or federal sponsored [place], including [employer], [place], or [employer].   You generally cannot be eligible for healthcare insurance through your employer.    How to apply: Eligibility screenings are held every Tuesday and Wednesday afternoon  from 1:00 pm until 4:00 pm. You do not need an appointment for the interview!  [facility] [address] [place], [place] [phone]   [facility] Health Department  [phone]   [facility] Health Department  [phone]   [facility] Health Department  [phone]    Behavioral Health Resources in the Community: Intensive Outpatient Programs Organization         Address  Phone  Notes  [facility] [address], [place], [place] [phone]   [facility] [address], [place], [place] [phone]   [facility] [address] [place], [place]  [phone]   [facility] Mental Health [address],  [place], [place] [phone] or [phone]   Substance Abuse Resources Organization         Address  Phone  Notes  [facility]  [phone]   [facility]  [phone]   [facility]  [phone]   Floydene Flock  [phone]   Residential & Outpatient Substance Abuse Program  [phone]   Psychological Services Organization         Address  Phone  Notes  [facility] Behavioral Health  [phone]   [facility]  [phone]   [facility] Mental Health [address]  [place] [phone] or [phone]    Mobile Crisis Teams Organization         Address  Phone  Notes  [facility], Mobile Crisis Care Unit  [phone]   [facility]  [address] [place], [place] [phone]   Doristine Locks [address] [place] [phone]    Self-Help/Support Groups Organization         Address  Phone             Notes  [facility] [place] - variety of support groups  336- I7437963 Call for more information  Narcotics Anonymous (NA), [facility] [address], [employer] [place]  2 meetings at this location   Statistician         Address  Phone  Notes  [facility] [address],    [place]  [phone]   [facility]  [address], [place] [address], [place], [place] [phone]   [facility] [address] [place], [place] [phone] Admissions: 8am-3pm M-F  [facility] [address],    [place], [place] [phone]   [facility] [address] [place], [place], [place] [phone]   [facility] [address]  [place], [place] [phone]   [facility] - Intensive Outpatient [address] Laurell Joseph[address], [place], [place] [phone]   [facility] ([facility] [address] [place]  [place], [place] [phone] or [phone]   [facility] ([facility]) [address] [place], [place] [phone] Accepts Medicaid  [facility] [address],  [place] [phone] Substance Abuse/Addiction Treatment   [facility] Organization         Address  Phone  Notes  [facility]  [phone]   Angie Fava, PhD [address] Ervin Knack [place], [place]   [phone] or [phone]   [facility]   [address] [place], [place] [phone]     [facility] [address], [place], [place] [phone] Insurance/Medicaid/sponsorship through [employer] and  Families [address]                                    [place], [place] [phone] Therapy/tele-psych/case  [facility] [address]   [place], [place] [phone]    Dr. Lolly Mustache  [phone]   [facility]  [employer] [facility] Dept. 1) [address] [place] 2) [address] [place] 3)  [address], [place] [phone]  [phone]   [facility] Child Abuse Hotline 925-700-4046 or [phone] (After Hours)     CCS ______[facility] LAPAROSCOPIC SURGERY: POST OP INSTRUCTIONS Always review your discharge instruction sheet given to you by the facility where your surgery was performed. IF YOU HAVE DISABILITY OR FAMILY LEAVE FORMS, YOU MUST BRING THEM TO THE OFFICE FOR PROCESSING.   DO NOT GIVE THEM TO YOUR DOCTOR.  1. A prescription for pain medication may be given to you upon discharge.  Take your pain medication as prescribed, if needed.  If narcotic pain medicine is not needed, then you may take acetaminophen (Tylenol) or ibuprofen (Advil) as needed. 2. Take your usually prescribed medications unless otherwise directed. 3. If you need a refill on your pain medication, please contact your pharmacy.  They will contact our office to request authorization. Prescriptions will not be filled after 5pm or on week-ends. 4. You should follow a light diet the first few days after arrival home, such as soup and crackers, etc.  Be sure to include lots of fluids daily. 5. Most patients will experience some swelling and bruising in the area of the incisions.  Ice packs will help.  Swelling and bruising can take several days to resolve.  6. It is common to experience some constipation if taking pain medication after surgery.  Increasing fluid intake and taking a stool  softener (such as Colace) will usually help or prevent this problem from occurring.  A mild laxative (Milk of Magnesia or Miralax) should be taken according to package instructions if there are no bowel movements after 48 hours. 7. Unless discharge instructions indicate otherwise, you may remove your bandages 24-48 hours after surgery, and you may shower at that time.  You may have steri-strips (small skin tapes) in place directly over the incision.  These strips should be left on the skin for 7-10 days.  If your surgeon used skin glue on the incision, you may shower in 24 hours.  The glue will flake off over the next 2-3 weeks.  Any sutures or staples will be removed at the office during your follow-up visit. 8. ACTIVITIES:  You may resume regular (light) daily activities beginning the next day--such as daily self-care, walking, climbing stairs--gradually increasing activities as tolerated.  You may have sexual intercourse when it is comfortable.  Refrain from any heavy lifting or straining until approved by your doctor. a. You may drive when you are no longer taking prescription pain medication, you can comfortably wear a seatbelt, and you can safely maneuver your car and apply brakes. b. RETURN TO WORK:  __________________________________________________________ 9. You should see your doctor in the office for a follow-up appointment approximately 2-3 weeks after your surgery.  Make sure that you call for this appointment within a day or two after you arrive home to insure a convenient appointment time. 10. OTHER INSTRUCTIONS:  __________________________________________________________________________________________________________________________ __________________________________________________________________________________________________________________________ WHEN TO CALL YOUR DOCTOR: 1. Fever over 101.0 2. Inability to urinate 3. Continued bleeding from incision. 4. Increased pain, redness, or  drainage from the incision. 5. Increasing abdominal pain  The clinic staff is available to answer your questions during regular business hours.  Please dont hesitate to call and ask to speak to one of the nurses for clinical concerns.  If you have a medical emergency, go to the nearest emergency room or call 911.  A surgeon from [facility] is always on call at the hospital. [address], [place], [place]  [address] ? [address] [address], [place], [place]   [address] [phone] ? [phone] ? FAX [phone] Web site: [email or web address]

## 2015-05-04 NOTE — Progress Notes (Signed)
TEAM 1 - Stepdown/ICU TEAM Progress Note  John Meza RUE:[id number] DOB: 09/09/1961 DOA: 04/29/2015 PCP: No PCP Per Patient  Admit HPI / Brief Narrative: 54 y.o. HM PMHx HTN and Diabetes Type 2 , S/P spine fracture with cord injury  Presented complaining of sharp constant abdominal pain after meals that started 4 days prior to admission.   Evaluation in the ED: Alkaline phosphatase 248, AST 677, ALT 534, bili 6.6, Abdominal US; sign of cholecystitis, CT angio chest abdomen: no PE, no dissection, gallbladder with evidence of cholecystitis.   HPI/Subjective: 7/23 primary language is Spanish; negative N/V, negative CP, negative SOB, appropriate abdomen tenderness.    Assessment/Plan: Acute cholecystitis with possible choledocholithiasis -Care as per General Surgery and GI - intraoperative cholangiogram not able to be accomplished due to very short cystic duct  - T bil correcting pt asymptomatic.  -GI okay to discharge; will discharge in a.m.   Sinus tachycardia v/s other SVT -Resolved  -TSH is normal  Hypomagnesemia -due to poor intake and GI loss, recheck in a.m. prior to discharge   Hypokalemia -Slightly low, recheck in a.m.   Sepsis due to biliary infection -hemodynamically stable - sepsis has resolved   DM Type II uncontrolled -Increase Lantus to 18 units daily  -Increase to moderate SSI  -Hemoglobin A1c ordered -Lipid panel ordered  Hx spinal cord injury with paraplegia w/ neurogenic bladder   Hyponatremia  -Improving with volume resuscitation   HTN -Blood pressure currently well controlled   Code Status: FULL Family Communication: Wife family present at time of exam Disposition Plan: Discharge in a.m.?    Consultants: Dr.Dora Larose Hires GI Dr[facility] General Surgery   Procedure/Significant Events: 7/20; Laparoscopic Cholecystectomy   Culture 7/19 blood right/left arm NGTD 7/19 MRSA by PCR negative 7/19 urine  negative   Antibiotics: Zosyn 7/18 >   DVT prophylaxis: Lovenox   Devices    LINES / TUBES:      Continuous Infusions: . 0.9 % NaCl with KCl 20 mEq / L 50 mL/hr at 05/03/15 1122    Objective: VITAL SIGNS: Temp: 97.9 F (36.6 C) (07/23 0435) Temp Source: Axillary (07/23 0435) BP: 112/53 mmHg (07/23 0700) Pulse Rate: 79 (07/23 0700) SPO2; FIO2:   Intake/Output Summary (Last 24 hours) at 05/04/15 0749 Last data filed at 05/04/15 0600  Gross per 24 hour  Intake 1627.5 ml  Output   4575 ml  Net -2947.5 ml     Exam: General: A/O 4, NAD, No acute respiratory distress Eyes: Negative headache, eye pain,  negative scleral hemorrhage ENT: Negative Runny nose, negative ear pain, negative tinnitus, negative gingival bleeding Neck:  Negative scars, masses, torticollis, lymphadenopathy, JVD Lungs: Clear to auscultation bilaterally without wheezes or crackles Cardiovascular: Regular rate and rhythm without murmur gallop or rub normal S1 and S2 Abdomen: positive abdominal pain worse suprapubic area, negative dysphagia, mild distention, soft, bowel sounds positive, no rebound, no ascites, no appreciable mass Extremities: No significant cyanosis, clubbing, or edema bilateral lower extremities Psychiatric:  Negative depression, negative anxiety, negative fatigue, negative mania  Neurologic:  Cranial nerves II through XII intact, tongue/uvula midline, all extremities muscle strength 5/5, sensation intact throughout, negative dysarthria, negative expressive aphasia, negative receptive aphasia.    Data Reviewed: Basic Metabolic Panel:  Recent Labs Lab 05/01/15 0307 05/01/15 1948 05/02/15 0536 05/03/15 0250 05/04/15 0246  NA 129* 131* 131* 133* 130*  K 3.4* 3.4* 3.9 3.5 3.4*  CL 97* 103 103 99* 98*  CO2 16* 19* 17*  25 23  GLUCOSE 184* 295* 228* 235* 250*  BUN <5* <5* <5* <5* <5*  CREATININE 0.62 0.60* 0.56* 0.49* 0.43*  CALCIUM 8.4* 8.2* 8.1* 8.1* 7.9*  MG  --  1.4*   --  1.7  --    Liver Function Tests:  Recent Labs Lab 05/01/15 0307 05/01/15 1948 05/02/15 0536 05/03/15 0250 05/04/15 0246  AST 184* 167* 106* 67* 38  ALT 359* 290* 229* 172* 122*  ALKPHOS 281* 288* 250* 239* 248*  BILITOT 8.4* 7.5* 7.8* 5.5* 2.6*  PROT 6.4* 6.2* 5.7* 5.9* 5.9*  ALBUMIN 3.0* 2.5* 2.3* 2.1* 2.1*    Recent Labs Lab 04/30/15 0420 04/30/15 0750  LIPASE 20* 22   No results for input(s): AMMONIA in the last 168 hours. CBC:  Recent Labs Lab 05/01/15 0307 05/01/15 1948 05/02/15 0536 05/03/15 0250 05/04/15 0246  WBC 16.6* 9.0 7.8 5.9 6.1  HGB 13.6 12.0* 11.7* 11.6* 11.5*  HCT 39.3 35.1* 34.1* 33.8* 33.3*  MCV 87.1 86.5 87.0 86.2 86.7  PLT 156 175 166 176 206   Cardiac Enzymes: No results for input(s): CKTOTAL, CKMB, CKMBINDEX, TROPONINI in the last 168 hours. BNP (last 3 results) No results for input(s): BNP in the last 8760 hours.  ProBNP (last 3 results) No results for input(s): PROBNP in the last 8760 hours.  CBG:  Recent Labs Lab 05/02/15 2148 05/03/15 0748 05/03/15 1333 05/03/15 1701 05/03/15 2230  GLUCAP 253* 187* 210* 240* 275*    Recent Results (from the past 240 hour(s))  Culture, blood (routine x 2)     Status: None (Preliminary result)   Collection Time: 04/30/15  7:50 AM  Result Value Ref Range Status   Specimen Description BLOOD RIGHT ARM  Final   Special Requests BOTTLES DRAWN AEROBIC AND ANAEROBIC 10CC  Final   Culture NO GROWTH 3 DAYS  Final   Report Status PENDING  Incomplete  Culture, blood (routine x 2)     Status: None (Preliminary result)   Collection Time: 04/30/15  8:00 AM  Result Value Ref Range Status   Specimen Description BLOOD LEFT ARM  Final   Special Requests BOTTLES DRAWN AEROBIC AND ANAEROBIC 10CC  Final   Culture NO GROWTH 3 DAYS  Final   Report Status PENDING  Incomplete  MRSA PCR Screening     Status: None   Collection Time: 04/30/15 10:00 AM  Result Value Ref Range Status   MRSA by PCR  NEGATIVE NEGATIVE Final    Comment:        The GeneXpert MRSA Assay (FDA approved for NASAL specimens only), is one component of a comprehensive MRSA colonization surveillance program. It is not intended to diagnose MRSA infection nor to guide or monitor treatment for MRSA infections.   Culture, Urine     Status: None   Collection Time: 04/30/15 11:20 AM  Result Value Ref Range Status   Specimen Description URINE, CATHETERIZED  Final   Special Requests NONE  Final   Culture 5,000 COLONIES/mL INSIGNIFICANT GROWTH  Final   Report Status 05/01/2015 FINAL  Final     Studies:  Recent x-ray studies have been reviewed in detail by the Attending Physician  Scheduled Meds:  Scheduled Meds: . enoxaparin (LOVENOX) injection  40 mg Subcutaneous Q24H  . insulin aspart  0-9 Units Subcutaneous TID WC  . insulin glargine  12 Units Subcutaneous Daily  . pantoprazole  40 mg Oral Q0600  . piperacillin-tazobactam (ZOSYN)  IV  3.375 g Intravenous Q8H    Time  spent on care of this patient: 40 mins   WOODS, Roselind Messier , MD  [facility] Office  [phone] Pager [phone]  On-Call/Text Page:      [email or web address]      password [email or web address]  If 7PM-7AM, please contact night-coverage [email or web address] Password [facility] 05/04/2015, 7:49 AM   LOS: 4 days   Care during the described time interval was provided by me .  I have reviewed this patient's available data, including medical history, events of note, physical examination, and all test results as part of my evaluation. I have personally reviewed and interpreted all radiology studies.   Carolyne Littles, MD [phone] Pager

## 2015-05-04 NOTE — Progress Notes (Signed)
          Daily Rounding Note  05/04/2015, 9:03 AM  LOS: 4 days   SUBJECTIVE:       Feels well, minimal right sided abd pain over surgical area.  Tolerating solids  OBJECTIVE:         Vital signs in last 24 hours:    Temp:  [97.8 F (36.6 C)-98.3 F (36.8 C)] 97.8 F (36.6 C) (07/23 0806) Pulse Rate:  [79-93] 82 (07/23 0806) Resp:  [16-29] 16 (07/23 0806) BP: (112-166)/(53-80) 120/68 mmHg (07/23 0806) SpO2:  [97 %-100 %] 98 % (07/23 0806) Last BM Date: 05/03/15 Filed Weights   04/29/15 1940  Weight: 190 lb (86.183 kg)   General: looks well, comfortable.    Heart: RRR.  No MRG Chest: clear bil.  No dyspnea, no cough Abdomen: soft, slight rUQ/r mid belly tenderness.  Active BS.    Extremities: no CCE Neuro/Psych:  Pleasant, alert.  Oriented x 3.  Paraplegia.   Intake/Output from previous day: 07/22 0701 - 07/23 0700 In: 1627.5 [I.V.:1477.5; IV Piggyback:150] Out: 0488 [Urine:4575]  Intake/Output this shift:    Lab Results:  Recent Labs  05/02/15 0536 05/03/15 0250 05/04/15 0246  WBC 7.8 5.9 6.1  HGB 11.7* 11.6* 11.5*  HCT 34.1* 33.8* 33.3*  PLT 166 176 206   BMET  Recent Labs  05/02/15 0536 05/03/15 0250 05/04/15 0246  NA 131* 133* 130*  K 3.9 3.5 3.4*  CL 103 99* 98*  CO2 17* 25 23  GLUCOSE 228* 235* 250*  BUN <5* <5* <5*  CREATININE 0.56* 0.49* 0.43*  CALCIUM 8.1* 8.1* 7.9*   LFT  Recent Labs  05/02/15 0536 05/03/15 0250 05/04/15 0246  PROT 5.7* 5.9* 5.9*  ALBUMIN 2.3* 2.1* 2.1*  AST 106* 67* 38  ALT 229* 172* 122*  ALKPHOS 250* 239* 248*  BILITOT 7.8* 5.5* 2.6*   PT/INR No results for input(s): LABPROT, INR in the last 72 hours. Hepatitis Panel No results for input(s): HEPBSAG, HCVAB, HEPAIGM, HEPBIGM in the last 72 hours.  Studies/Results: No results found.  ASSESMENT:   * ? Choledocholithiasis.preop ultrasound with 4 mm CBD, no CBD stones. Unable to get IOC due to short  cystic duct.  All LFTs steadily trending down except the alk phos. Hepatitis serologies negative. Clinically stable. Remains on Zosyn. ? If the rods in his spine are contraindication to MR study?   * 2 day s/p lap chole.Unable to get IOC due to short cystic duct.   * Neurogenic bladder requiring self catheterization after spinal cord injury, paraplegia. Urine culture with 5 K/insignificant growth. Wheelchair bound at [name]     PLAN   *  Continue to observe. ? Home soon/today?  Surgery has signed off.  Can have LFTs checked at post surgical follow up.       Azucena Freed  05/04/2015, 9:03 AM Attending MD note:   I have taken a history, examined the patient, and reviewed the chart. I agree with the Advanced Practitioner's impression and recommendations. . Feeling betterm Bili down to 2.5, tolerated breakfast. S/p lap chole, resolving cholestatic jaundice. Will sign off. Needs repar LFT's  with surgery on post op appointment or his PCP  [facility] Pager # [phone]

## 2015-05-05 DIAGNOSIS — K8050 Calculus of bile duct without cholangitis or cholecystitis without obstruction: Secondary | ICD-10-CM | POA: Diagnosis present

## 2015-05-05 DIAGNOSIS — Z794 Long term (current) use of insulin: Secondary | ICD-10-CM | POA: Diagnosis present

## 2015-05-05 DIAGNOSIS — R1013 Epigastric pain: Secondary | ICD-10-CM | POA: Diagnosis present

## 2015-05-05 DIAGNOSIS — E1165 Type 2 diabetes mellitus with hyperglycemia: Secondary | ICD-10-CM | POA: Diagnosis present

## 2015-05-05 DIAGNOSIS — E876 Hypokalemia: Secondary | ICD-10-CM | POA: Diagnosis present

## 2015-05-05 DIAGNOSIS — IMO0001 Reserved for inherently not codable concepts without codable children: Secondary | ICD-10-CM | POA: Diagnosis present

## 2015-05-05 DIAGNOSIS — E871 Hypo-osmolality and hyponatremia: Secondary | ICD-10-CM | POA: Diagnosis present

## 2015-05-05 DIAGNOSIS — E119 Type 2 diabetes mellitus without complications: Secondary | ICD-10-CM | POA: Diagnosis present

## 2015-05-05 DIAGNOSIS — R000 Tachycardia, unspecified: Secondary | ICD-10-CM | POA: Diagnosis present

## 2015-05-05 DIAGNOSIS — I10 Essential (primary) hypertension: Secondary | ICD-10-CM | POA: Diagnosis present

## 2015-05-05 LAB — LIPID PANEL
Cholesterol: 229 mg/dL — ABNORMAL HIGH (ref 0–200)
HDL: 20 mg/dL — AB (ref >40)
LDL CALC: 174 mg/dL — AB (ref 0–99)
Total CHOL/HDL Ratio: 11.5 RATIO
Triglycerides: 173 mg/dL — ABNORMAL HIGH (ref <150)
VLDL: 35 mg/dL (ref 0–40)

## 2015-05-05 LAB — GLUCOSE, CAPILLARY
GLUCOSE-CAPILLARY: 201 mg/dL — AB (ref 65–99)
Glucose-Capillary: 185 mg/dL — ABNORMAL HIGH (ref 65–99)
Glucose-Capillary: 193 mg/dL — ABNORMAL HIGH (ref 65–99)
Glucose-Capillary: 227 mg/dL — ABNORMAL HIGH (ref 65–99)
Glucose-Capillary: 263 mg/dL — ABNORMAL HIGH (ref 65–99)
Glucose-Capillary: 284 mg/dL — ABNORMAL HIGH (ref 65–99)

## 2015-05-05 LAB — CBC WITH DIFFERENTIAL/PLATELET
Basophils Absolute: 0 10*3/uL (ref 0.0–0.1)
Basophils Relative: 1 % (ref 0–1)
Eosinophils Absolute: 0.2 10*3/uL (ref 0.0–0.7)
Eosinophils Relative: 4 % (ref 0–5)
HCT: 33.1 % — ABNORMAL LOW (ref 39.0–52.0)
Hemoglobin: 11.2 g/dL — ABNORMAL LOW (ref 13.0–17.0)
LYMPHS ABS: 2 10*3/uL (ref 0.7–4.0)
Lymphocytes Relative: 36 % (ref 12–46)
MCH: 29.2 pg (ref 26.0–34.0)
MCHC: 33.8 g/dL (ref 30.0–36.0)
MCV: 86.2 fL (ref 78.0–100.0)
MONOS PCT: 12 % (ref 3–12)
Monocytes Absolute: 0.6 10*3/uL (ref 0.1–1.0)
NEUTROS ABS: 2.6 10*3/uL (ref 1.7–7.7)
NEUTROS PCT: 47 % (ref 43–77)
Platelets: 253 10*3/uL (ref 150–400)
RBC: 3.84 MIL/uL — ABNORMAL LOW (ref 4.22–5.81)
RDW: 14.2 % (ref 11.5–15.5)
WBC: 5.5 10*3/uL (ref 4.0–10.5)

## 2015-05-05 LAB — COMPREHENSIVE METABOLIC PANEL
ALBUMIN: 2.3 g/dL — AB (ref 3.5–5.0)
ALK PHOS: 271 U/L — AB (ref 38–126)
ALT: 114 U/L — ABNORMAL HIGH (ref 17–63)
ANION GAP: 9 (ref 5–15)
AST: 61 U/L — AB (ref 15–41)
CO2: 23 mmol/L (ref 22–32)
Calcium: 8.2 mg/dL — ABNORMAL LOW (ref 8.9–10.3)
Chloride: 102 mmol/L (ref 101–111)
Creatinine, Ser: 0.39 mg/dL — ABNORMAL LOW (ref 0.61–1.24)
GFR calc non Af Amer: >60 mL/min (ref >60)
Glucose, Bld: 221 mg/dL — ABNORMAL HIGH (ref 65–99)
POTASSIUM: 3.5 mmol/L (ref 3.5–5.1)
Sodium: 134 mmol/L — ABNORMAL LOW (ref 135–145)
TOTAL PROTEIN: 6.1 g/dL — AB (ref 6.5–8.1)
Total Bilirubin: 1.7 mg/dL — ABNORMAL HIGH (ref 0.3–1.2)

## 2015-05-05 LAB — CULTURE, BLOOD (ROUTINE X 2)
Culture: NO GROWTH
Culture: NO GROWTH

## 2015-05-05 LAB — MAGNESIUM: MAGNESIUM: 1.6 mg/dL — AB (ref 1.7–2.4)

## 2015-05-05 MED ORDER — INSULIN GLARGINE 100 UNIT/ML ~~LOC~~ SOLN
40.0000 [IU] | Freq: Every day | SUBCUTANEOUS | Status: DC
  Administered 2015-05-05: 40 [IU] via SUBCUTANEOUS
  Filled 2015-05-05 (×2): qty 0.4

## 2015-05-05 MED ORDER — ATORVASTATIN CALCIUM 20 MG PO TABS
20.0000 mg | ORAL_TABLET | Freq: Every day | ORAL | Status: DC
  Filled 2015-05-05: qty 1

## 2015-05-05 MED ORDER — MAGNESIUM OXIDE 400 (241.3 MG) MG PO TABS
600.0000 mg | ORAL_TABLET | Freq: Once | ORAL | Status: AC
  Administered 2015-05-05: 600 mg via ORAL
  Filled 2015-05-05: qty 1.5

## 2015-05-05 MED ORDER — METFORMIN HCL 850 MG PO TABS
850.0000 mg | ORAL_TABLET | Freq: Three times a day (TID) | ORAL | Status: DC
  Administered 2015-05-05 – 2015-05-06 (×3): 850 mg via ORAL
  Filled 2015-05-05 (×5): qty 1

## 2015-05-05 NOTE — Progress Notes (Signed)
Called report to Engineer, water on 6 [place]. Pt will be transferred per bed since he is paraplegic. Wife aware of pt transfer.

## 2015-05-05 NOTE — Progress Notes (Signed)
Pt transferred per bed with all belongings to 6 [address] bed 23 by Equities trader. Family oriented to new room.

## 2015-05-05 NOTE — Plan of Care (Signed)
Problem: Phase I Progression Outcomes Goal: Voiding-avoid urinary catheter unless indicated Outcome: Not Met (add Reason) Pt self caths q 4hr at home Foley catheter in place will d/c when pt goes home

## 2015-05-05 NOTE — Progress Notes (Addendum)
DR Joseph Art examined pt and has Spanish interpretor here to interact with pt and his wife. Able to complete head to toe assessment and pt is oriented

## 2015-05-05 NOTE — Progress Notes (Signed)
[facility] TEAM 1 - Stepdown/ICU TEAM Progress Note  John Meza WUJ:[id number] DOB: 1961-02-04 DOA: 04/29/2015 PCP: No PCP Per Patient  Admit HPI / Brief Narrative: 54 y.o. HM PMHx HTN and Diabetes Type 2 , S/P spine fracture with cord injury  Presented complaining of sharp constant abdominal pain after meals that started 4 days prior to admission.   Evaluation in the ED: Alkaline phosphatase 248, AST 677, ALT 534, bili 6.6, Abdominal US; sign of cholecystitis, CT angio chest abdomen: no PE, no dissection, gallbladder with evidence of cholecystitis.   HPI/Subjective: 7/23 primary language is Spanish; negative N/V, negative CP, negative SOB, appropriate abdomen tenderness.    Assessment/Plan: Acute cholecystitis with possible choledocholithiasis -Care as per General Surgery and GI - intraoperative cholangiogram not able to be accomplished due to very short cystic duct  - T bil correcting pt asymptomatic.  -Follow-up with Dr. Violeta Gelinas ([facility]) within 2 weeks of discharge    Sinus tachycardia v/s other SVT -Resolved  -TSH is normal  Hypomagnesemia -due to poor intake and GI loss, recheck in a.m. prior to discharge   Hypokalemia -Slightly low, recheck in a.m.   Sepsis due to unspecified organism in biliary infection -hemodynamically stable - sepsis has resolved   DM Type II uncontrolled -Increase Lantus to 40 units daily; at home patient on [place] 40 units daily  -Restart metformin 850 mg TID  -Hemoglobin A1c ordered -Lipid panel ordered -Will maintain patient overnight to assure his CBG are within normal limit prior to discharge. -Patient will follow-up with Dr. Jerrilyn Cairo (PCP) within one week of discharge at the [facility]  Hx spinal cord injury with paraplegia w/ neurogenic bladder   Hyponatremia  -Improving with volume resuscitation   HTN -Blood pressure currently well controlled  HLD  -Patient's lipid  panel not within ADA guidelines -Start Lipitor 20 mg daily as outpatient after patient's LFTs have normalized   Code Status: FULL Family Communication: Wife family present at time of exam Disposition Plan: Discharge in a.m.?    Consultants: Dr.Dora Larose Hires GI Dr.[facility] General Surgery   Procedure/Significant Events: 7/20; Laparoscopic Cholecystectomy   Culture 7/19 blood right/left arm NGTD 7/19 MRSA by PCR negative 7/19 urine negative   Antibiotics: Zosyn 7/18 >   DVT prophylaxis: Lovenox   Devices    LINES / TUBES:      Continuous Infusions: . 0.9 % NaCl with KCl 20 mEq / L 50 mL/hr at 05/04/15 1406    Objective: VITAL SIGNS: Temp: 98.1 F (36.7 C) (07/24 0814) Temp Source: Oral (07/24 0814) BP: 122/72 mmHg (07/24 0814) Pulse Rate: 82 (07/24 0400) SPO2; FIO2:   Intake/Output Summary (Last 24 hours) at 05/05/15 0920 Last data filed at 05/05/15 0916  Gross per 24 hour  Intake 1457.5 ml  Output   5200 ml  Net -3742.5 ml     Exam: General: A/O 4, NAD, No acute respiratory distress Eyes: Negative headache, eye pain,  negative scleral hemorrhage ENT: Negative Runny nose, negative ear pain, negative tinnitus, negative gingival bleeding Neck:  Negative scars, masses, torticollis, lymphadenopathy, JVD Lungs: Clear to auscultation bilaterally without wheezes or crackles Cardiovascular: Regular rate and rhythm without murmur gallop or rub normal S1 and S2 Abdomen: Negative abdominal pain,negative dysphagia, negative distention, soft, bowel sounds positive, no rebound, no ascites, no appreciable mass Extremities: No significant cyanosis, clubbing, or edema bilateral lower extremities Psychiatric:  Negative depression, negative anxiety, negative fatigue, negative mania  Neurologic:  Cranial nerves II through XII intact, tongue/uvula midline, bilateral upper  extremities muscle strength 5/5, sensation intact throughout, negative  dysarthria, negative expressive aphasia, negative receptive aphasia.    Data Reviewed: Basic Metabolic Panel:  Recent Labs Lab 05/01/15 1948 05/02/15 0536 05/03/15 0250 05/04/15 0246 05/05/15 0352  NA 131* 131* 133* 130* 134*  K 3.4* 3.9 3.5 3.4* 3.5  CL 103 103 99* 98* 102  CO2 19* 17* 25 23 23   GLUCOSE 295* 228* 235* 250* 221*  BUN <5* <5* <5* <5* <5*  CREATININE 0.60* 0.56* 0.49* 0.43* 0.39*  CALCIUM 8.2* 8.1* 8.1* 7.9* 8.2*  MG 1.4*  --  1.7  --  1.6*   Liver Function Tests:  Recent Labs Lab 05/01/15 1948 05/02/15 0536 05/03/15 0250 05/04/15 0246 05/05/15 0352  AST 167* 106* 67* 38 61*  ALT 290* 229* 172* 122* 114*  ALKPHOS 288* 250* 239* 248* 271*  BILITOT 7.5* 7.8* 5.5* 2.6* 1.7*  PROT 6.2* 5.7* 5.9* 5.9* 6.1*  ALBUMIN 2.5* 2.3* 2.1* 2.1* 2.3*    Recent Labs Lab 04/30/15 0420 04/30/15 0750  LIPASE 20* 22   No results for input(s): AMMONIA in the last 168 hours. CBC:  Recent Labs Lab 05/01/15 1948 05/02/15 0536 05/03/15 0250 05/04/15 0246 05/05/15 0352  WBC 9.0 7.8 5.9 6.1 5.5  NEUTROABS  --   --   --   --  2.6  HGB 12.0* 11.7* 11.6* 11.5* 11.2*  HCT 35.1* 34.1* 33.8* 33.3* 33.1*  MCV 86.5 87.0 86.2 86.7 86.2  PLT 175 166 176 206 253   Cardiac Enzymes: No results for input(s): CKTOTAL, CKMB, CKMBINDEX, TROPONINI in the last 168 hours. BNP (last 3 results) No results for input(s): BNP in the last 8760 hours.  ProBNP (last 3 results) No results for input(s): PROBNP in the last 8760 hours.  CBG:  Recent Labs Lab 05/04/15 1642 05/04/15 2007 05/05/15 0049 05/05/15 0443 05/05/15 0751  GLUCAP 274* 298* 263* 193* 185*    Recent Results (from the past 240 hour(s))  Culture, blood (routine x 2)     Status: None (Preliminary result)   Collection Time: 04/30/15  7:50 AM  Result Value Ref Range Status   Specimen Description BLOOD RIGHT ARM  Final   Special Requests BOTTLES DRAWN AEROBIC AND ANAEROBIC 10CC  Final   Culture NO GROWTH 4  DAYS  Final   Report Status PENDING  Incomplete  Culture, blood (routine x 2)     Status: None (Preliminary result)   Collection Time: 04/30/15  8:00 AM  Result Value Ref Range Status   Specimen Description BLOOD LEFT ARM  Final   Special Requests BOTTLES DRAWN AEROBIC AND ANAEROBIC 10CC  Final   Culture NO GROWTH 4 DAYS  Final   Report Status PENDING  Incomplete  MRSA PCR Screening     Status: None   Collection Time: 04/30/15 10:00 AM  Result Value Ref Range Status   MRSA by PCR NEGATIVE NEGATIVE Final    Comment:        The GeneXpert MRSA Assay (FDA approved for NASAL specimens only), is one component of a comprehensive MRSA colonization surveillance program. It is not intended to diagnose MRSA infection nor to guide or monitor treatment for MRSA infections.   Culture, Urine     Status: None   Collection Time: 04/30/15 11:20 AM  Result Value Ref Range Status   Specimen Description URINE, CATHETERIZED  Final   Special Requests NONE  Final   Culture 5,000 COLONIES/mL  INSIGNIFICANT GROWTH  Final   Report Status 05/01/2015 FINAL  Final     Studies:  Recent x-ray studies have been reviewed in detail by the Attending Physician  Scheduled Meds:  Scheduled Meds: . enoxaparin (LOVENOX) injection  40 mg Subcutaneous Q24H  . insulin aspart  0-15 Units Subcutaneous 6 times per day  . insulin glargine  40 Units Subcutaneous Daily  . metFORMIN  850 mg Oral TID WC  . pantoprazole  40 mg Oral Q0600  . piperacillin-tazobactam (ZOSYN)  IV  3.375 g Intravenous Q8H    Time spent on care of this patient: 40 mins   Delainey Winstanley, Roselind Messier , MD  [facility] Office  [phone] Pager - [phone]  On-Call/Text Page:      [email or web address]      password [email or web address]  If 7PM-7AM, please contact night-coverage [email or web address] Password [email or web address] 05/05/2015, 9:20 AM   LOS: 5 days   Care during the described time interval was provided by me .  I have reviewed this patient's available data,  including medical history, events of note, physical examination, and all test results as part of my evaluation. I have personally reviewed and interpreted all radiology studies.   Carolyne Littles, MD [phone] Pager

## 2015-05-05 NOTE — Plan of Care (Signed)
Problem: Problem: Diabetes Management Progression Goal: INCREASE DIABETES KNOWLEDGE Outcome: Adequate for Discharge Pt and wife given Spanish Diabetes Exit Care notes on new med Metformin, Diabetes & food, Diabetes & Sick Day Management, Blood Glucose Monitoring, & Basic Carbohydrate Counting for Diabetes Mellitus. Consult placed for DM in pt teaching & Dietician.

## 2015-05-06 DIAGNOSIS — R109 Unspecified abdominal pain: Secondary | ICD-10-CM | POA: Insufficient documentation

## 2015-05-06 LAB — COMPREHENSIVE METABOLIC PANEL
ALBUMIN: 2.5 g/dL — AB (ref 3.5–5.0)
ALT: 238 U/L — AB (ref 17–63)
ANION GAP: 7 (ref 5–15)
AST: 207 U/L — AB (ref 15–41)
Alkaline Phosphatase: 277 U/L — ABNORMAL HIGH (ref 38–126)
BUN: <5 mg/dL — ABNORMAL LOW (ref 6–20)
CHLORIDE: 106 mmol/L (ref 101–111)
CO2: 25 mmol/L (ref 22–32)
Calcium: 8.7 mg/dL — ABNORMAL LOW (ref 8.9–10.3)
Creatinine, Ser: 0.37 mg/dL — ABNORMAL LOW (ref 0.61–1.24)
GFR calc Af Amer: >60 mL/min (ref >60)
GFR calc non Af Amer: >60 mL/min (ref >60)
GLUCOSE: 186 mg/dL — AB (ref 65–99)
Potassium: 3.5 mmol/L (ref 3.5–5.1)
Sodium: 138 mmol/L (ref 135–145)
TOTAL PROTEIN: 6.6 g/dL (ref 6.5–8.1)
Total Bilirubin: 1.6 mg/dL — ABNORMAL HIGH (ref 0.3–1.2)

## 2015-05-06 LAB — HEMOGLOBIN A1C
Hgb A1c MFr Bld: 8.7 % — ABNORMAL HIGH (ref 4.8–5.6)
Mean Plasma Glucose: 203 mg/dL

## 2015-05-06 LAB — CBC WITH DIFFERENTIAL/PLATELET
Basophils Absolute: 0 10*3/uL (ref 0.0–0.1)
Basophils Relative: 0 % (ref 0–1)
EOS ABS: 0.2 10*3/uL (ref 0.0–0.7)
Eosinophils Relative: 3 % (ref 0–5)
HCT: 34.9 % — ABNORMAL LOW (ref 39.0–52.0)
Hemoglobin: 11.8 g/dL — ABNORMAL LOW (ref 13.0–17.0)
Lymphocytes Relative: 31 % (ref 12–46)
Lymphs Abs: 2.1 10*3/uL (ref 0.7–4.0)
MCH: 29.9 pg (ref 26.0–34.0)
MCHC: 33.8 g/dL (ref 30.0–36.0)
MCV: 88.6 fL (ref 78.0–100.0)
Monocytes Absolute: 0.7 10*3/uL (ref 0.1–1.0)
Monocytes Relative: 11 % (ref 3–12)
NEUTROS PCT: 55 % (ref 43–77)
Neutro Abs: 3.7 10*3/uL (ref 1.7–7.7)
PLATELETS: 261 10*3/uL (ref 150–400)
RBC: 3.94 MIL/uL — AB (ref 4.22–5.81)
RDW: 14.5 % (ref 11.5–15.5)
WBC: 6.7 10*3/uL (ref 4.0–10.5)

## 2015-05-06 LAB — GLUCOSE, CAPILLARY
GLUCOSE-CAPILLARY: 165 mg/dL — AB (ref 65–99)
GLUCOSE-CAPILLARY: 218 mg/dL — AB (ref 65–99)
Glucose-Capillary: 161 mg/dL — ABNORMAL HIGH (ref 65–99)
Glucose-Capillary: 186 mg/dL — ABNORMAL HIGH (ref 65–99)

## 2015-05-06 LAB — MAGNESIUM: MAGNESIUM: 1.6 mg/dL — AB (ref 1.7–2.4)

## 2015-05-06 MED ORDER — INSULIN GLARGINE 100 UNIT/ML ~~LOC~~ SOLN
48.0000 [IU] | Freq: Every day | SUBCUTANEOUS | Status: DC
  Administered 2015-05-06: 48 [IU] via SUBCUTANEOUS
  Filled 2015-05-06: qty 0.48

## 2015-05-06 MED ORDER — INSULIN GLARGINE 300 UNIT/ML ~~LOC~~ SOPN: 48.0000 [IU] | PEN_INJECTOR | Freq: Every day | SUBCUTANEOUS | Status: DC

## 2015-05-06 MED ORDER — MAGNESIUM SULFATE 2 GM/50ML IV SOLN
2.0000 g | Freq: Once | INTRAVENOUS | Status: AC
  Administered 2015-05-06: 2 g via INTRAVENOUS
  Filled 2015-05-06: qty 50

## 2015-05-06 MED ORDER — HYDROCODONE-ACETAMINOPHEN 5-325 MG PO TABS: 1.0000 | ORAL_TABLET | ORAL | Status: DC | PRN

## 2015-05-06 NOTE — Discharge Summary (Signed)
Physician Discharge Summary  John Meza ZOX:[id number] DOB: Sep 26, 1961 DOA: 04/29/2015  PCP: No PCP Per Patient  Admit date: 04/29/2015 Discharge date: 05/06/2015  Time spent: 40 minutes  Recommendations for Outpatient Follow-up:  Acute cholecystitis with possible choledocholithiasis -Care as per General Surgery and GI - intraoperative cholangiogram not able to be accomplished due to very short cystic duct  - T bil correcting pt asymptomatic.  -Follow-up with Dr. Violeta Gelinas ([facility]) within 2 weeks of discharge   Sinus tachycardia v/s other SVT -Resolved  -TSH is normal  Hypomagnesemia -due to poor intake and GI loss -Magnesium IV 2 gm 1 prior to discharge -PCP to monitor patient's electrolytes follow-up appointment.  Hypokalemia -Slightly low, recheck in a.m.   Sepsis due to unspecified organism in biliary infection -hemodynamically stable - sepsis has resolved   DM Type II uncontrolled -Increase Lantus to 48 units daily; upon discharge will continue patient on Toujeo 48 units daily  -Continue Metformin 850 mg TID  -Hemoglobin A1c ordered still pending; clinic will need to review findings -Patient will follow-up with Dr. 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 bladder  -Chronic low significant change  Hyponatremia  -Resolved    HTN -Blood pressure currently well controlled  HLD  -Patient's lipid panel not within ADA guidelines -PCP to Start Lipitor 20 mg daily as outpatient after patient's LFTs have normalized      Discharge Diagnoses:  Active Problems:   Obstructive jaundice   Sepsis   Cholecystitis   Acute cholecystitis   Biliary tract imaging abnormality   Sinus tachycardia   Blood poisoning   Diabetes type 2, uncontrolled   Hyponatremia   Essential hypertension   Choledocholithiasis   Hypomagnesemia    Hypokalemia   Epigastric pain   Discharge Condition: Stable   Diet recommendation: American diabetic Association    Filed Weights   04/29/15 1940 05/05/15 1423  Weight: 86.183 kg (190 lb) 84.8 kg (186 lb 15.2 oz)    History of present illness:  54 y.o. HM PMHx HTN and Diabetes Type 2 , S/P spine fracture with cord injury  Presented complaining of sharp constant abdominal pain after meals that started 4 days prior to admission.  Evaluation in the ED: Alkaline phosphatase 248, AST 677, ALT 534, bili 6.6, Abdominal US; sign of cholecystitis, CT angio chest abdomen: no PE, no dissection, gallbladder with evidence of cholecystitis.  During his hospitalization patient was found to have acute cholecystitis with probable choledocho lithiasis. Patient was taken to the OR where a laparoscopic cholecystectomy was performed. In addition patient has completed a 7 day course of antibiotics and is currently ready for discharge.     Consultants: Dr.Dora Larose [facility] Dr.Douglas [facility] General Surgery   Procedure/Significant Events: 7/20; Laparoscopic Cholecystectomy   Culture 7/19 blood right/left arm NGTD 7/19 MRSA by PCR negative 7/19 urine negative   Antibiotics: Zosyn 7/18 > stopped 7/25   Discharge Exam: Filed Vitals:   05/05/15 0814 05/05/15 1423 05/05/15 2144 05/06/15 0452  BP: 122/72 151/87 119/66 121/76  Pulse:  87 80 86  Temp: 98.1 F (36.7 C)  98.4 F (36.9 C) 98.6 F (37 C)  TempSrc: Oral  Oral   Resp:  17 16 16   Height:  5\' 7"  (1.702 m)    Weight:  84.8 kg (186 lb 15.2 oz)    SpO2:  100% 99% 100%  General: A/O 4, NAD, No acute respiratory distress Eyes: Negative headache, eye pain, negative scleral hemorrhage ENT: Negative Runny nose, negative ear pain, negative tinnitus, negative gingival bleeding Neck: Negative scars, masses, torticollis, lymphadenopathy, JVD Lungs: Clear to auscultation bilaterally without wheezes or  crackles Cardiovascular: Regular rate and rhythm without murmur gallop or rub normal S1 and S2 Abdomen: Negative abdominal pain,negative dysphagia, negative distention, soft, bowel sounds positive, no rebound, no ascites, no appreciable mass. Surgical sites are clean, negative erythema, negative discharge, negative tendernesst Extremities: No significant cyanosis, clubbing, or edema bilateral lower extremities Psychiatric: Negative depression, negative anxiety, negative fatigue, negative mania  Neurologic: Cranial nerves II through XII intact, tongue/uvula midline, bilateral upper extremities muscle strength 5/5, sensation intact throughout, negative dysarthria, negative expressive aphasia, negative receptive aphasia.   Discharge Instructions     Medication List    TAKE these medications        HYDROcodone-acetaminophen 5-325 MG per tablet  Commonly known as:  NORCO/VICODIN  Take 1-2 tablets by mouth every 4 (four) hours as needed for moderate pain or severe pain.     pantoprazole 40 MG tablet  Commonly known as:  PROTONIX  TAKE ONE TABLET BY MOUTH ONCE DAILY     pantoprazole 20 MG tablet  Commonly known as:  PROTONIX  Take 1 tablet (20 mg total) by mouth daily.      ASK your doctor about these medications        ciprofloxacin 250 MG tablet  Commonly known as:  CIPRO  Take 1 tablet (250 mg total) by mouth 2 (two) times daily.     cyclobenzaprine 10 MG tablet  Commonly known as:  FLEXERIL  Take 10 mg by mouth 3 (three) times daily as needed for muscle spasms.     DEXILANT 60 MG capsule  Generic drug:  dexlansoprazole  Take 60 mg by mouth daily.     DULCOLAX 10 MG suppository  Generic drug:  bisacodyl  Place 10 mg rectally as needed.     lisinopril 5 MG tablet  Commonly known as:  PRINIVIL,ZESTRIL  Take 5 mg by mouth daily.     lovastatin 10 MG tablet  Commonly known as:  MEVACOR  Take 10 mg by mouth at bedtime.     metFORMIN 850 MG tablet  Commonly known as:   GLUCOPHAGE  Take 850 mg by mouth 3 (three) times daily.     metroNIDAZOLE 500 MG tablet  Commonly known as:  FLAGYL  Take 500 mg by mouth 2 (two) times daily.     ondansetron 8 MG tablet  Commonly known as:  ZOFRAN  Take 8 mg by mouth every 8 (eight) hours as needed for nausea or vomiting.       No Known Allergies     Follow-up Information    Follow up with [facility]     In 1 week.   Why:  Call tomorrow for recheck this week. Follow-up in one week laparoscopic cholecystectomy, uncontrolled diabetes   Contact information:   [address] [place] [address] [phone]      Follow up with Liz Malady, MD. Schedule an appointment as soon as possible for a visit in 2 weeks.   Specialty:  General Surgery   Why:  Follow-up in 2 weeks postdischarge for laparoscopic cholecystectomy   Contact information:   [address] [place] [address] [phone]        The results of significant diagnostics  from this hospitalization (including imaging, microbiology, ancillary and laboratory) are listed below for reference.    Significant Diagnostic Studies: Dg Chest 2 View  04/29/2015   CLINICAL DATA:  Shortness of breath, chest and back pain for 3 days.  EXAM: CHEST  2 VIEW  COMPARISON:  December 04, 2011  FINDINGS: The heart size and mediastinal contours are within normal limits. There is no focal infiltrate, pulmonary edema, or pleural effusion. Spinal rods are unchanged. There is scoliosis of spine.  IMPRESSION: No active cardiopulmonary disease.   Electronically Signed   By: Sherian Rein M.D.   On: 04/29/2015 18:51   US Abdomen Complete  04/30/2015   CLINICAL DATA:  Chest and abdominal pain for 2 days. History of diabetes, hypertension.  EXAM: ULTRASOUND ABDOMEN COMPLETE  COMPARISON:  CT chest, abdomen and pelvis April 29, 2015  FINDINGS: Gallbladder: Echogenic layering gallbladder sludge in addition to punctate  echogenic gallstones. Gallbladder wall thickening at 8 mm. No sonographic Murphy's sign elicited.  Common bile duct: Diameter: 4 mm  Liver: Echogenic heterogeneous liver without intrahepatic biliary dilatation. Hepatopetal portal vein. Hepatopetal portal vein.  IVC: No abnormality visualized.  Pancreas: Visualized portion unremarkable.  Spleen: Size and appearance within normal limits.  Right Kidney: Length: 12.1 cm. Echogenicity within normal limits. No mass or hydronephrosis visualized.  Left Kidney: Length: 12 cm. Echogenicity within normal limits. No mass or hydronephrosis visualized.  Abdominal aorta: No aneurysm visualized.  Other findings: None.  IMPRESSION: Sonographic findings of cholecystitis though, no sonographic Murphy's sign elicited.  Heterogeneous echogenic liver can be seen with hepatocellular disease/steatosis.   Electronically Signed   By: Awilda Metro M.D.   On: 04/30/2015 03:44   Ct Angio Chest Aorta W/cm &/or Wo/cm  04/30/2015   CLINICAL DATA:  Acute onset of generalized chest and abdominal pain. Initial encounter.  EXAM: CT ANGIOGRAPHY CHEST, ABDOMEN AND PELVIS  TECHNIQUE: Multidetector CT imaging through the chest, abdomen and pelvis was performed using the standard protocol during bolus administration of intravenous contrast. Multiplanar reconstructed images and MIPs were obtained and reviewed to evaluate the vascular anatomy.  CONTRAST:  OMNIPAQUE IOHEXOL 350 MG/ML SOLN  COMPARISON:  CTA of the chest performed 11/17/2011, and CTA of the abdomen and pelvis from 11/16/2011  FINDINGS: CTA CHEST FINDINGS  There is no evidence of aortic dissection. There is no evidence of aneurysmal dilatation. No significant calcific atherosclerotic disease is noted along the thoracic aorta.  There is no evidence of pulmonary embolus.  Mild bibasilar atelectasis is noted. The lungs are otherwise clear. There is no evidence of pleural effusion or pneumothorax. No masses are identified; no abnormal  focal contrast enhancement is seen.  The mediastinum is unremarkable in appearance. No mediastinal lymphadenopathy is seen. No pericardial effusion is identified. The great vessels are grossly unremarkable in appearance. No axillary lymphadenopathy is seen. The thyroid gland is unremarkable in appearance.  No acute osseous abnormalities are seen.  Review of the MIP images confirms the above findings.  CTA ABDOMEN AND PELVIS FINDINGS  There is no evidence of aortic dissection. There is no evidence of aneurysmal dilatation. No calcific atherosclerotic disease is seen. The celiac trunk, superior mesenteric artery, bilateral renal arteries and inferior mesenteric artery are unremarkable in appearance. The inferior vena cava is within normal limits.  Mild soft tissue inflammation is noted about the gallbladder, concerning for mild cholecystitis. Would correlate for associated symptoms.  Heterogeneity of the liver may reflect underlying venous congestion. The spleen is unremarkable in appearance. The pancreas  and adrenal glands are unremarkable.  Mild nonspecific perinephric stranding is noted bilaterally. The kidneys are otherwise unremarkable. There is no evidence of hydronephrosis. No renal or ureteral stones are seen.  No free fluid is identified. The small bowel is unremarkable in appearance. The stomach is within normal limits. No acute vascular abnormalities are seen.  The appendix is normal caliber, without evidence of appendicitis. The colon is unremarkable in appearance.  The bladder is moderately distended and grossly unremarkable. Mild cystic change is noted at the right side of the prostate. This is somewhat more prominent than in 2013. The prostate remains normal in size. No inguinal lymphadenopathy is seen. A small left inguinal hernia is noted, containing only fat.  No acute osseous abnormalities are identified. There is mild chronic compression deformity at T11, with underlying lumbar spinal fusion at  T9-L1.  Review of the MIP images confirms the above findings.  IMPRESSION: 1. No evidence of aortic dissection. No evidence of aneurysmal dilatation. No calcific atherosclerotic disease seen. 2. No evidence of pulmonary embolus. 3. Mild soft tissue inflammation about the gallbladder, raising concern for mild cholecystitis. Would correlate for associated symptoms. 4. Heterogeneity of the liver may reflect underlying venous congestion. 5. Mild bibasilar atelectasis noted; lungs otherwise clear. 6. Mild cystic change at the right side of the prostate. This is somewhat more prominent than in 2013. Would correlate with PSA. 7. Small left inguinal hernia, containing only fat. 8. Mild chronic compression deformity at T11, with underlying postoperative change.   Electronically Signed   By: Roanna Raider M.D.   On: 04/30/2015 01:03   Ct Angio Abd/pel W/ And/or W/o  04/30/2015   CLINICAL DATA:  Acute onset of generalized chest and abdominal pain. Initial encounter.  EXAM: CT ANGIOGRAPHY CHEST, ABDOMEN AND PELVIS  TECHNIQUE: Multidetector CT imaging through the chest, abdomen and pelvis was performed using the standard protocol during bolus administration of intravenous contrast. Multiplanar reconstructed images and MIPs were obtained and reviewed to evaluate the vascular anatomy.  CONTRAST:  OMNIPAQUE IOHEXOL 350 MG/ML SOLN  COMPARISON:  CTA of the chest performed 11/17/2011, and CTA of the abdomen and pelvis from 11/16/2011  FINDINGS: CTA CHEST FINDINGS  There is no evidence of aortic dissection. There is no evidence of aneurysmal dilatation. No significant calcific atherosclerotic disease is noted along the thoracic aorta.  There is no evidence of pulmonary embolus.  Mild bibasilar atelectasis is noted. The lungs are otherwise clear. There is no evidence of pleural effusion or pneumothorax. No masses are identified; no abnormal focal contrast enhancement is seen.  The mediastinum is unremarkable in appearance. No  mediastinal lymphadenopathy is seen. No pericardial effusion is identified. The great vessels are grossly unremarkable in appearance. No axillary lymphadenopathy is seen. The thyroid gland is unremarkable in appearance.  No acute osseous abnormalities are seen.  Review of the MIP images confirms the above findings.  CTA ABDOMEN AND PELVIS FINDINGS  There is no evidence of aortic dissection. There is no evidence of aneurysmal dilatation. No calcific atherosclerotic disease is seen. The celiac trunk, superior mesenteric artery, bilateral renal arteries and inferior mesenteric artery are unremarkable in appearance. The inferior vena cava is within normal limits.  Mild soft tissue inflammation is noted about the gallbladder, concerning for mild cholecystitis. Would correlate for associated symptoms.  Heterogeneity of the liver may reflect underlying venous congestion. The spleen is unremarkable in appearance. The pancreas and adrenal glands are unremarkable.  Mild nonspecific perinephric stranding is noted bilaterally. The kidneys are  otherwise unremarkable. There is no evidence of hydronephrosis. No renal or ureteral stones are seen.  No free fluid is identified. The small bowel is unremarkable in appearance. The stomach is within normal limits. No acute vascular abnormalities are seen.  The appendix is normal caliber, without evidence of appendicitis. The colon is unremarkable in appearance.  The bladder is moderately distended and grossly unremarkable. Mild cystic change is noted at the right side of the prostate. This is somewhat more prominent than in 2013. The prostate remains normal in size. No inguinal lymphadenopathy is seen. A small left inguinal hernia is noted, containing only fat.  No acute osseous abnormalities are identified. There is mild chronic compression deformity at T11, with underlying lumbar spinal fusion at T9-L1.  Review of the MIP images confirms the above findings.  IMPRESSION: 1. No evidence  of aortic dissection. No evidence of aneurysmal dilatation. No calcific atherosclerotic disease seen. 2. No evidence of pulmonary embolus. 3. Mild soft tissue inflammation about the gallbladder, raising concern for mild cholecystitis. Would correlate for associated symptoms. 4. Heterogeneity of the liver may reflect underlying venous congestion. 5. Mild bibasilar atelectasis noted; lungs otherwise clear. 6. Mild cystic change at the right side of the prostate. This is somewhat more prominent than in 2013. Would correlate with PSA. 7. Small left inguinal hernia, containing only fat. 8. Mild chronic compression deformity at T11, with underlying postoperative change.   Electronically Signed   By: Roanna Raider M.D.   On: 04/30/2015 01:03    Microbiology: Recent Results (from the past 240 hour(s))  Culture, blood (routine x 2)     Status: None   Collection Time: 04/30/15  7:50 AM  Result Value Ref Range Status   Specimen Description BLOOD RIGHT ARM  Final   Special Requests BOTTLES DRAWN AEROBIC AND ANAEROBIC 10CC  Final   Culture NO GROWTH 5 DAYS  Final   Report Status 05/05/2015 FINAL  Final  Culture, blood (routine x 2)     Status: None   Collection Time: 04/30/15  8:00 AM  Result Value Ref Range Status   Specimen Description BLOOD LEFT ARM  Final   Special Requests BOTTLES DRAWN AEROBIC AND ANAEROBIC 10CC  Final   Culture NO GROWTH 5 DAYS  Final   Report Status 05/05/2015 FINAL  Final  MRSA PCR Screening     Status: None   Collection Time: 04/30/15 10:00 AM  Result Value Ref Range Status   MRSA by PCR NEGATIVE NEGATIVE Final    Comment:        The GeneXpert MRSA Assay (FDA approved for NASAL specimens only), is one component of a comprehensive MRSA colonization surveillance program. It is not intended to diagnose MRSA infection nor to guide or monitor treatment for MRSA infections.   Culture, Urine     Status: None   Collection Time: 04/30/15 11:20 AM  Result Value Ref Range  Status   Specimen Description URINE, CATHETERIZED  Final   Special Requests NONE  Final   Culture 5,000 COLONIES/mL INSIGNIFICANT GROWTH  Final   Report Status 05/01/2015 FINAL  Final     Labs: Basic Metabolic Panel:  Recent Labs Lab 05/01/15 1948 05/02/15 0536 05/03/15 0250 05/04/15 0246 05/05/15 0352 05/06/15 0534  NA 131* 131* 133* 130* 134* 138  K 3.4* 3.9 3.5 3.4* 3.5 3.5  CL 103 103 99* 98* 102 106  CO2 19* 17* GLUCOSE 295* 228* 235* 250* 221* 186*  BUN <5* <5* <5* <  5* <5* <5*  CREATININE 0.60* 0.56* 0.49* 0.43* 0.39* 0.37*  CALCIUM 8.2* 8.1* 8.1* 7.9* 8.2* 8.7*  MG 1.4*  --  1.7  --  1.6* 1.6*   Liver Function Tests:  Recent Labs Lab 05/02/15 0536 05/03/15 0250 05/04/15 0246 05/05/15 0352 05/06/15 0534  AST 106* 67* 38 61* 207*  ALT 229* 172* 122* 114* 238*  ALKPHOS 250* 239* 248* 271* 277*  BILITOT 7.8* 5.5* 2.6* 1.7* 1.6*  PROT 5.7* 5.9* 5.9* 6.1* 6.6  ALBUMIN 2.3* 2.1* 2.1* 2.3* 2.5*    Recent Labs Lab 04/30/15 0420 04/30/15 0750  LIPASE 20* 22   No results for input(s): AMMONIA in the last 168 hours. CBC:  Recent Labs Lab 05/02/15 0536 05/03/15 0250 05/04/15 0246 05/05/15 0352 05/06/15 0534  WBC 7.8 5.9 6.1 5.5 6.7  NEUTROABS  --   --   --  2.6 3.7  HGB 11.7* 11.6* 11.5* 11.2* 11.8*  HCT 34.1* 33.8* 33.3* 33.1* 34.9*  MCV 87.0 86.2 86.7 86.2 88.6  PLT 166 176 206 253 261   Cardiac Enzymes: No results for input(s): CKTOTAL, CKMB, CKMBINDEX, TROPONINI in the last 168 hours. BNP: BNP (last 3 results) No results for input(s): BNP in the last 8760 hours.  ProBNP (last 3 results) No results for input(s): PROBNP in the last 8760 hours.  CBG:  Recent Labs Lab 05/05/15 1225 05/05/15 1642 05/05/15 2003 05/06/15 0020 05/06/15 0357  GLUCAP 284* 227* 201* 186* 165*       Signed:  Carolyne Littles, MD [facility] [phone] pager

## 2015-05-06 NOTE — Progress Notes (Signed)
Telephonic interpreting provided for patients discharge. Pleasure of speaking with John Meza to interpret for patient and patients wife. Discussed discharge summary with patient. Reviewed all medications with patient. Patient received Rx. Patient ready for discharge.

## 2015-12-30 ENCOUNTER — Encounter (HOSPITAL_COMMUNITY): Payer: Self-pay

## 2015-12-30 ENCOUNTER — Emergency Department (HOSPITAL_COMMUNITY): Payer: MEDICAID

## 2015-12-30 ENCOUNTER — Emergency Department (HOSPITAL_COMMUNITY)
Admission: EM | Admit: 2015-12-30 | Discharge: 2015-12-30 | Disposition: A | Discharge status: Home / Self Care | Payer: MEDICAID | Attending: Emergency Medicine | Admitting: Emergency Medicine

## 2015-12-30 DIAGNOSIS — Z87828 Personal history of other (healed) physical injury and trauma: Secondary | ICD-10-CM | POA: Insufficient documentation

## 2015-12-30 DIAGNOSIS — R112 Nausea with vomiting, unspecified: Secondary | ICD-10-CM | POA: Insufficient documentation

## 2015-12-30 DIAGNOSIS — Z8669 Personal history of other diseases of the nervous system and sense organs: Secondary | ICD-10-CM | POA: Insufficient documentation

## 2015-12-30 DIAGNOSIS — I10 Essential (primary) hypertension: Secondary | ICD-10-CM | POA: Insufficient documentation

## 2015-12-30 DIAGNOSIS — N390 Urinary tract infection, site not specified: Secondary | ICD-10-CM | POA: Insufficient documentation

## 2015-12-30 DIAGNOSIS — Z794 Long term (current) use of insulin: Secondary | ICD-10-CM | POA: Insufficient documentation

## 2015-12-30 DIAGNOSIS — E119 Type 2 diabetes mellitus without complications: Secondary | ICD-10-CM | POA: Insufficient documentation

## 2015-12-30 DIAGNOSIS — Z79899 Other long term (current) drug therapy: Secondary | ICD-10-CM | POA: Insufficient documentation

## 2015-12-30 DIAGNOSIS — R000 Tachycardia, unspecified: Secondary | ICD-10-CM | POA: Insufficient documentation

## 2015-12-30 DIAGNOSIS — Z8781 Personal history of (healed) traumatic fracture: Secondary | ICD-10-CM | POA: Insufficient documentation

## 2015-12-30 DIAGNOSIS — R197 Diarrhea, unspecified: Secondary | ICD-10-CM | POA: Insufficient documentation

## 2015-12-30 DIAGNOSIS — Z7984 Long term (current) use of oral hypoglycemic drugs: Secondary | ICD-10-CM | POA: Insufficient documentation

## 2015-12-30 LAB — URINALYSIS, ROUTINE W REFLEX MICROSCOPIC
Glucose, UA: 500 mg/dL — AB
Ketones, ur: >80 mg/dL — AB
NITRITE: POSITIVE — AB
PROTEIN: 100 mg/dL — AB
Specific Gravity, Urine: 1.023 (ref 1.005–1.030)
pH: 5.5 (ref 5.0–8.0)

## 2015-12-30 LAB — COMPREHENSIVE METABOLIC PANEL
ALK PHOS: 82 U/L (ref 38–126)
ALT: 21 U/L (ref 17–63)
ANION GAP: 13 (ref 5–15)
AST: 21 U/L (ref 15–41)
Albumin: 3.1 g/dL — ABNORMAL LOW (ref 3.5–5.0)
BILIRUBIN TOTAL: 1.4 mg/dL — AB (ref 0.3–1.2)
BUN: 12 mg/dL (ref 6–20)
CALCIUM: 8.7 mg/dL — AB (ref 8.9–10.3)
CO2: 20 mmol/L — AB (ref 22–32)
CREATININE: 0.88 mg/dL (ref 0.61–1.24)
Chloride: 97 mmol/L — ABNORMAL LOW (ref 101–111)
Glucose, Bld: 286 mg/dL — ABNORMAL HIGH (ref 65–99)
Potassium: 4.1 mmol/L (ref 3.5–5.1)
Sodium: 130 mmol/L — ABNORMAL LOW (ref 135–145)
TOTAL PROTEIN: 7.3 g/dL (ref 6.5–8.1)

## 2015-12-30 LAB — CBC WITH DIFFERENTIAL/PLATELET
BASOS PCT: 0 %
Basophils Absolute: 0 10*3/uL (ref 0.0–0.1)
EOS ABS: 0 10*3/uL (ref 0.0–0.7)
EOS PCT: 0 %
HCT: 39.3 % (ref 39.0–52.0)
Hemoglobin: 13.7 g/dL (ref 13.0–17.0)
LYMPHS ABS: 1.1 10*3/uL (ref 0.7–4.0)
Lymphocytes Relative: 11 %
MCH: 30.2 pg (ref 26.0–34.0)
MCHC: 34.9 g/dL (ref 30.0–36.0)
MCV: 86.6 fL (ref 78.0–100.0)
Monocytes Absolute: 0.9 10*3/uL (ref 0.1–1.0)
Monocytes Relative: 9 %
NEUTROS PCT: 80 %
Neutro Abs: 8.1 10*3/uL — ABNORMAL HIGH (ref 1.7–7.7)
PLATELETS: 117 10*3/uL — AB (ref 150–400)
RBC: 4.54 MIL/uL (ref 4.22–5.81)
RDW: 13.2 % (ref 11.5–15.5)
WBC: 10.1 10*3/uL (ref 4.0–10.5)

## 2015-12-30 LAB — URINE MICROSCOPIC-ADD ON

## 2015-12-30 LAB — I-STAT CG4 LACTIC ACID, ED
Lactic Acid, Venous: 1.64 mmol/L (ref 0.5–2.0)
Lactic Acid, Venous: 2.15 mmol/L (ref 0.5–2.0)

## 2015-12-30 LAB — CBG MONITORING, ED: GLUCOSE-CAPILLARY: 272 mg/dL — AB (ref 65–99)

## 2015-12-30 MED ORDER — ACETAMINOPHEN 325 MG PO TABS
650.0000 mg | ORAL_TABLET | Freq: Once | ORAL | Status: AC
  Administered 2015-12-30: 650 mg via ORAL
  Filled 2015-12-30: qty 2

## 2015-12-30 MED ORDER — SODIUM CHLORIDE 0.9 % IV BOLUS (SEPSIS)
1000.0000 mL | Freq: Once | INTRAVENOUS | Status: AC
  Administered 2015-12-30: 1000 mL via INTRAVENOUS

## 2015-12-30 MED ORDER — CEFDINIR 300 MG PO CAPS: 300.0000 mg | ORAL_CAPSULE | Freq: Two times a day (BID) | ORAL | Status: DC

## 2015-12-30 MED ORDER — SODIUM CHLORIDE 0.9 % IV BOLUS (SEPSIS)
1000.0000 mL | Freq: Once | INTRAVENOUS | Status: AC
Start: 2015-12-30 — End: 2015-12-30
  Administered 2015-12-30: 1000 mL via INTRAVENOUS

## 2015-12-30 MED ORDER — ONDANSETRON HCL 4 MG PO TABS: 4.0000 mg | ORAL_TABLET | Freq: Four times a day (QID) | ORAL | Status: DC

## 2015-12-30 MED ORDER — CEFTRIAXONE SODIUM 1 G IJ SOLR
1.0000 g | Freq: Once | INTRAMUSCULAR | Status: AC
  Administered 2015-12-30: 1 g via INTRAVENOUS
  Filled 2015-12-30: qty 10

## 2015-12-30 NOTE — Care Management Note (Addendum)
Case Management Note  Patient Details  Name: John Meza MRN: [id number] Date of Birth: 23-May-1961  Subjective/Objective:      Patient presented to [facility] ED   With complaints of foul smelling urine, patient is a paraplegic. Patient is self pay and cannot afford antibiotic that was prescribed           Action/Plan: CM met with patient to confirm information. Discussed MATCH program and the guidelines including the  $3 co-pay per prescription. Pt enrolled, [place] letter printed and given with list of participating pharmacies.  Pt verbalized understanding and is agreeable with plan.  Patient  prescriptions with [facility] letter and was instructed to present to a  [facility] participating Pharmacies from the list given. Pt voiced understanding and appreciation. Reminded patient of the importance of following up with PCP.  Teach back done patient verbalized understanding teach back done. No further questions or concerns verbalized.    Expected Discharge Date:     12/30/15             Expected Discharge Plan:  Home/Self Care  In-House Referral:  NA  Discharge planning Services  [place] Program  Post Acute Care Choice:    Choice offered to:  Patient, Spouse  DME Arranged:    DME Agency:     HH Arranged:    [place] Agency:     Status of Service:  Completed, signed off  Medicare Important Message Given:    Date Medicare IM Given:    Medicare IM give by:    Date Additional Medicare IM Given:    Additional Medicare Important Message give by:     If discussed at [place] of Stay Meetings, dates discussed:    Additional CommentsLaurena Slimmer, RN 12/30/2015, 9:14 PM

## 2015-12-30 NOTE — Discharge Instructions (Signed)
  Infeccin urinaria  (Urinary Tract Infection)  La infeccin urinaria puede ocurrir en cualquier lugar del tracto urinario. El tracto urinario es un sistema de drenaje del cuerpo por el que se eliminan los desechos y el exceso de agua. El tracto urinario est formado por dos riones, dos urteres, la vejiga y la uretra. Los riones son rganos que tienen forma de frijol. Cada rin tiene aproximadamente el tamao del puo. Estn situados debajo de las costillas, uno a cada lado de la columna vertebral CAUSAS  La causa de la infeccin son los microbios, que son organismos microscpicos, que incluyen hongos, virus, y bacterias. Estos organismos son tan pequeos que slo pueden verse a travs del microscopio. Las bacterias son los microorganismos que ms comnmente causan infecciones urinarias.  SNTOMAS  Los sntomas pueden variar segn la edad y el sexo del paciente y por la ubicacin de la infeccin. Los sntomas en las mujeres jvenes incluyen la necesidad frecuente e intensa de orinar y una sensacin dolorosa de ardor en la vejiga o en la uretra durante la miccin. Las mujeres y los hombres mayores podrn sentir cansancio, temblores y debilidad y sentir dolores musculares y dolor abdominal. Si tiene fiebre, puede significar que la infeccin est en los riones. Otros sntomas son dolor en la espalda o en los lados debajo de las costillas, nuseas y vmitos.  DIAGNSTICO  Para diagnosticar una infeccin urinaria, el mdico le preguntar acerca de sus sntomas. Tambin le solicitar una muestra de orina. La muestra de orina se analiza para detectar bacterias y glbulos blancos de la sangre. Los glbulos blancos se forman en el organismo para ayudar a combatir las infecciones.  TRATAMIENTO  Por lo general, las infecciones urinarias pueden tratarse con medicamentos. Debido a que la mayora de las infecciones son causadas por bacterias, por lo general pueden tratarse con antibiticos. La eleccin del  antibitico y la duracin del tratamiento depender de sus sntomas y el tipo de bacteria causante de la infeccin.  INSTRUCCIONES PARA EL CUIDADO EN EL HOGAR   Si le recetaron antibiticos, tmelos exactamente como su mdico le indique. Termine el medicamento aunque se sienta mejor despus de haber tomado slo algunos.  Beba gran cantidad de lquido para mantener la orina de tono claro o color amarillo plido.  Evite la cafena, el t y las bebidas gaseosas. Estas sustancias irritan la vejiga.  Vaciar la vejiga con frecuencia. Evite retener la orina durante largos perodos.  Vace la vejiga antes y despus de tener relaciones sexuales.  Despus de mover el intestino, las mujeres deben higienizarse la regin perineal desde adelante hacia atrs. Use slo un papel tissue por vez. SOLICITE ATENCIN MDICA SI:   Siente dolor en la espalda.  Le sube la fiebre.  Los sntomas no mejoran luego de 3 das. SOLICITE ATENCIN MDICA DE INMEDIATO SI:   Siente dolor intenso en la espalda o en la zona inferior del abdomen.  Comienza a sentir escalofros.  Tiene nuseas o vmitos.  Tiene una sensacin continua de quemazn o molestias al orinar. ASEGRESE DE QUE:   Comprende estas instrucciones.  Controlar su enfermedad.  Solicitar ayuda de inmediato si no mejora o empeora.   Esta informacin no tiene como fin reemplazar el consejo del mdico. Asegrese de hacerle al mdico cualquier pregunta que tenga.   Document Released: 07/08/2005 Document Revised: 06/22/2012 Elsevier Interactive Patient Education 2016 [employer]  

## 2015-12-30 NOTE — ED Notes (Signed)
MD at bedside. 

## 2015-12-30 NOTE — ED Notes (Signed)
Pt.s daughter reports that pt. Has been having n/v.  Also believes he may have a UTI due to the color of his urine.  Pt. Is fevered 99.8  Pt. Is a paraplegic and does self -cath.  Skin is warm and dry.

## 2015-12-30 NOTE — ED Provider Notes (Signed)
CSN: [id number]     Arrival date & time 12/30/15  1334 History   First MD Initiated Contact with Patient 12/30/15 1555     Chief Complaint  Patient presents with  . Fever     Patient is a 55 y.o. male presenting with fever. The history is provided by the patient and a relative. A language interpreter was used.  Fever  John Meza is a 55 y.o. male who presents to the Emergency Department complaining of fever.  Hx is provided by the patient and his family via interpreter phone.  He reports subjective fever/chills since Friday with nausea, occasional vomiting and diarrhea.  No reports of pain, cough, sob.  He has a hx/o DM, T11 spinal cord injury and he performs self caths at home.  His urine has a fishy odor and dark color, which is unusual for him.  Sxs are moderate, constant, worsening.    Past Medical History  Diagnosis Date  . Diabetes mellitus   . Hypertension   . Spine fracture 11/16/2011    T 11- T9-L1  . SCI (spinal cord injury)   . Paraplegia (HCC) 2013    fell from ladder   Past Surgical History  Procedure Laterality Date  . Spine surgery    . Cholecystectomy N/A 05/01/2015    Procedure: LAPAROSCOPIC CHOLECYSTECTOMY WITH ATTEMPTED INTRAOPERATIVE CHOLANGIOGRAM;  Surgeon: Violeta GelinasBurke Thompson, MD;  Location: [facility] OR;  Service: General;  Laterality: N/A;   Family History  Problem Relation Age of Onset  . Diabetes Father    Social History  Substance Use Topics  . Smoking status: Never Smoker   . Smokeless tobacco: Never Used  . Alcohol Use: 0.6 oz/week    1 Cans of beer per week    Review of Systems  Constitutional: Positive for fever.  All other systems reviewed and are negative.     Allergies  Other  Home Medications   Prior to Admission medications   Medication Sig Start Date End Date Taking? Authorizing Provider  glimepiride (AMARYL) 2 MG tablet Take 2 mg by mouth daily with breakfast.   Yes Historical Provider, MD  lovastatin (MEVACOR) 10 MG tablet Take  10 mg by mouth at bedtime.   Yes Historical Provider, MD  metFORMIN (GLUCOPHAGE) 850 MG tablet Take 850 mg by mouth 2 (two) times daily with a meal.    Yes Historical Provider, MD  pantoprazole (PROTONIX) 40 MG tablet Take 40 mg by mouth daily.   Yes Historical Provider, MD  cefdinir (OMNICEF) 300 MG capsule Take 1 capsule (300 mg total) by mouth 2 (two) times daily. 12/30/15   Tilden FossaElizabeth Kaziyah Parkison, MD  HYDROcodone-acetaminophen (NORCO/VICODIN) 5-325 MG per tablet Take 1-2 tablets by mouth every 4 (four) hours as needed for moderate pain or severe pain. 05/06/15   Drema Dallasurtis J Woods, MD  Insulin Glargine (TOUJEO SOLOSTAR) 300 UNIT/ML SOPN Inject 48 Units into the skin daily. 05/06/15   Drema Dallasurtis J Woods, MD  ondansetron (ZOFRAN) 4 MG tablet Take 1 tablet (4 mg total) by mouth every 6 (six) hours. 12/30/15   Tilden FossaElizabeth Kesia Dalto, MD   BP 110/71 mmHg  Pulse 87  Temp(Src) 98.3 F (36.8 C) (Oral)  Resp 16  Ht 5\' 5"  (1.651 m)  Wt 160 lb (72.576 kg)  BMI 26.63 kg/m2  SpO2 94% Physical Exam  Constitutional: He is oriented to person, place, and time. He appears well-developed and well-nourished.  HENT:  Head: Normocephalic and atraumatic.  Cardiovascular: Regular rhythm.   No murmur heard. tachcyardic  Pulmonary/Chest: Effort normal and breath sounds normal. No respiratory distress.  Abdominal: Soft. There is no tenderness. There is no rebound and no guarding.  Musculoskeletal: He exhibits no edema or tenderness.  Neurological: He is alert and oriented to person, place, and time.  Skin: Skin is warm and dry.  Psychiatric: He has a normal mood and affect. His behavior is normal.  Nursing note and vitals reviewed.   ED Course  Procedures (including critical care time) Labs Review Labs Reviewed  COMPREHENSIVE METABOLIC PANEL - Abnormal; Notable for the following:    Sodium 130 (*)    Chloride 97 (*)    CO2 20 (*)    Glucose, Bld 286 (*)    Calcium 8.7 (*)    Albumin 3.1 (*)    Total Bilirubin 1.4 (*)     All other components within normal limits  CBC WITH DIFFERENTIAL/PLATELET - Abnormal; Notable for the following:    Platelets 117 (*)    Neutro Abs 8.1 (*)    All other components within normal limits  URINALYSIS, ROUTINE W REFLEX MICROSCOPIC (NOT AT [facility]) - Abnormal; Notable for the following:    Color, Urine AMBER (*)    APPearance TURBID (*)    Glucose, UA 500 (*)    Hgb urine dipstick LARGE (*)    Bilirubin Urine SMALL (*)    Ketones, ur >80 (*)    Protein, ur 100 (*)    Nitrite POSITIVE (*)    Leukocytes, UA LARGE (*)    All other components within normal limits  URINE MICROSCOPIC-ADD ON - Abnormal; Notable for the following:    Squamous Epithelial / LPF 6-30 (*)    Bacteria, UA MANY (*)    All other components within normal limits  CBG MONITORING, ED - Abnormal; Notable for the following:    Glucose-Capillary 272 (*)    All other components within normal limits  I-STAT CG4 LACTIC ACID, ED - Abnormal; Notable for the following:    Lactic Acid, Venous 2.15 (*)    All other components within normal limits  URINE CULTURE  I-STAT CG4 LACTIC ACID, ED    Imaging Review Dg Chest 2 View  12/30/2015  CLINICAL DATA:  55 year old male with history of fever urine for 2 days. Possible urinary tract infection. Prior history of urinary tract infections. Paraplegia. EXAM: CHEST  2 VIEW COMPARISON:  Multiple priors, most recently 04/29/2015. FINDINGS: Lung volumes are low. No consolidative airspace disease. No pleural effusions. No pneumothorax. No pulmonary nodule or mass noted. Pulmonary vasculature and the cardiomediastinal silhouette are within normal limits. Atherosclerosis in the thoracic aorta. Orthopedic fixation hardware in the lower thoracic and upper lumbar spine. IMPRESSION: 1. Low lung volumes without radiographic evidence of acute cardiopulmonary disease. 2. Atherosclerosis. Electronically Signed   By: Trudie Reed M.D.   On: 12/30/2015 15:46   I have personally reviewed and  evaluated these images and lab results as part of my medical decision-making.   EKG Interpretation None      MDM   Final diagnoses:  Acute UTI     Patient with history of paraplegia at the level of T11 here with chills, vomiting, diarrhea. Vomiting has resolved today. He is nontoxic appearing on examination. No evidence of sepsis. UA is consistent with UTI. Provide a dose of Rocephin in the emergency department with prescription for Ceftin ER twice a day. Discussed very close home care and return precautions for development of any new or worrisome symptoms.    Tilden Fossa,  MD 12/30/15 2201

## 2016-01-02 LAB — URINE CULTURE

## 2016-01-03 ENCOUNTER — Telehealth: Payer: Self-pay | Admitting: *Deleted

## 2016-01-03 NOTE — ED Notes (Signed)
(+)  urine culture, chart reviewed by Enzo BiNathan Batchelder, Pharm, treated with cefdinir, no changes needed.

## 2016-06-26 IMAGING — CT CT CTA ABD/PEL W/CM AND/OR W/O CM
1 of 11 series · 1 of 36 positions shown · IV contrast (omnipaque)
Comparison: CTA of the chest performed 11/17/2011, and CTA of the
abdomen and pelvis from 11/16/2011

CLINICAL DATA: Acute onset of generalized chest and abdominal pain.
Initial encounter.

EXAM:
CT ANGIOGRAPHY CHEST, ABDOMEN AND PELVIS
TECHNIQUE: Multidetector CT imaging through the chest, abdomen and pelvis was
performed using the standard protocol during bolus administration of
intravenous contrast. Multiplanar reconstructed images and MIPs were
obtained and reviewed to evaluate the vascular anatomy.
CONTRAST:  100mL OMNIPAQUE IOHEXOL 350 MG/ML SOLN

[Series 300: locator · axial · 0.68mm/px · 1 of 1 slices shown]
[im 1/1  lung]
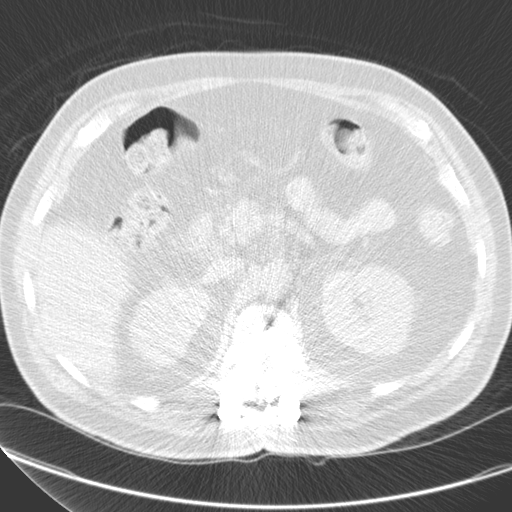

[1 of 36 positions shown; findings below may reference images not displayed]

FINDINGS: CTA CHEST FINDINGS

There is no evidence of aortic dissection. There is no evidence of
aneurysmal dilatation. No significant calcific atherosclerotic
disease is noted along the thoracic aorta.

There is no evidence of pulmonary embolus.

Mild bibasilar atelectasis is noted. The lungs are otherwise clear.
There is no evidence of pleural effusion or pneumothorax. No masses
are identified; no abnormal focal contrast enhancement is seen.

The mediastinum is unremarkable in appearance. No mediastinal
lymphadenopathy is seen. No pericardial effusion is identified. The
great vessels are grossly unremarkable in appearance. No axillary
lymphadenopathy is seen. The thyroid gland is unremarkable in
appearance.

No acute osseous abnormalities are seen.

Review of the MIP images confirms the above findings.

CTA ABDOMEN AND PELVIS FINDINGS

There is no evidence of aortic dissection. There is no evidence of
aneurysmal dilatation. No calcific atherosclerotic disease is seen.
The celiac trunk, superior mesenteric artery, bilateral renal
arteries and inferior mesenteric artery are unremarkable in
appearance. The inferior vena cava is within normal limits.

Mild soft tissue inflammation is noted about the gallbladder,
concerning for mild cholecystitis. Would correlate for associated
symptoms.

Heterogeneity of the liver may reflect underlying venous congestion.
The spleen is unremarkable in appearance. The pancreas and adrenal
glands are unremarkable.

Mild nonspecific perinephric stranding is noted bilaterally. The
kidneys are otherwise unremarkable. There is no evidence of
hydronephrosis. No renal or ureteral stones are seen.

No free fluid is identified. The small bowel is unremarkable in
appearance. The stomach is within normal limits. No acute vascular
abnormalities are seen.

The appendix is normal caliber, without evidence of appendicitis.
The colon is unremarkable in appearance.

The bladder is moderately distended and grossly unremarkable. Mild
cystic change is noted at the right side of the prostate. This is
somewhat more prominent than in 8469. The prostate remains normal in
size. No inguinal lymphadenopathy is seen. A small left inguinal
hernia is noted, containing only fat.

No acute osseous abnormalities are identified. There is mild chronic
compression deformity at T11, with underlying lumbar spinal fusion
at T9-L1.

Review of the MIP images confirms the above findings.
IMPRESSION: 1. No evidence of aortic dissection. No evidence of aneurysmal
dilatation. No calcific atherosclerotic disease seen.
2. No evidence of pulmonary embolus.
3. Mild soft tissue inflammation about the gallbladder, raising
concern for mild cholecystitis. Would correlate for associated
symptoms.
4. Heterogeneity of the liver may reflect underlying venous
congestion.
5. Mild bibasilar atelectasis noted; lungs otherwise clear.
6. Mild cystic change at the right side of the prostate. This is
somewhat more prominent than in 8469. Would correlate with PSA.
7. Small left inguinal hernia, containing only fat.
8. Mild chronic compression deformity at T11, with underlying
postoperative change.

## 2016-10-12 DIAGNOSIS — L0231 Cutaneous abscess of buttock: Secondary | ICD-10-CM

## 2016-10-12 DIAGNOSIS — L03317 Cellulitis of buttock: Secondary | ICD-10-CM

## 2016-10-12 HISTORY — DX: Cellulitis of buttock: L02.31

## 2016-10-12 HISTORY — DX: Cellulitis of buttock: L03.317

## 2016-11-01 ENCOUNTER — Encounter (HOSPITAL_COMMUNITY): Payer: Self-pay

## 2016-11-01 ENCOUNTER — Ambulatory Visit (HOSPITAL_COMMUNITY)
Admission: EM | Admit: 2016-11-01 | Discharge: 2016-11-01 | Disposition: A | Discharge status: Home / Self Care | Payer: Self-pay | Attending: Emergency Medicine | Admitting: Emergency Medicine

## 2016-11-01 DIAGNOSIS — L03317 Cellulitis of buttock: Secondary | ICD-10-CM

## 2016-11-01 MED ORDER — CEFTRIAXONE SODIUM 1 G IJ SOLR
1.0000 g | Freq: Once | INTRAMUSCULAR | Status: AC
  Administered 2016-11-01: 1 g via INTRAMUSCULAR

## 2016-11-01 MED ORDER — LIDOCAINE HCL 2 % IJ SOLN
INTRAMUSCULAR | Status: AC
Start: 2016-11-01 — End: 2016-11-01
  Filled 2016-11-01: qty 20

## 2016-11-01 MED ORDER — STERILE WATER FOR INJECTION IJ SOLN
INTRAMUSCULAR | Status: AC
Start: 2016-11-01 — End: 2016-11-01
  Filled 2016-11-01: qty 10

## 2016-11-01 MED ORDER — CEFTRIAXONE SODIUM 1 G IJ SOLR
INTRAMUSCULAR | Status: AC
Start: 2016-11-01 — End: 2016-11-01
  Filled 2016-11-01: qty 10

## 2016-11-01 MED ORDER — CEPHALEXIN 500 MG PO CAPS: 500.0000 mg | ORAL_CAPSULE | Freq: Three times a day (TID) | ORAL | 0 refills | Status: DC

## 2016-11-01 NOTE — Discharge Instructions (Signed)
°  Please continue to take the antibiotic given to you by your primary care provider yesterday in addition to the new antibiotic prescribed today- cephalexin (Keflex).

## 2016-11-01 NOTE — ED Notes (Signed)
Patient was observed for 20 minutes due to a previous reaction to an unknown injection. The patient has no S/S of reaction and no complaints at the end of the observation period.

## 2016-11-01 NOTE — ED Triage Notes (Signed)
[facility] a boil on right buttock x3 days, pt went to Clinic on yesterday and received an injection to treat symptoms patient returned to clinic today and was sent her for further treatment due to condition has worsen

## 2016-11-01 NOTE — ED Provider Notes (Signed)
CSN: [id number]     Arrival date & time 11/01/16  1617 History   First MD Initiated Contact with Patient 11/01/16 1807     Chief Complaint  Patient presents with  . Recurrent Skin Infections   (Consider location/radiation/quality/duration/timing/severity/associated sxs/prior Treatment) HPI John Meza is a 56 y.o. male wheelchair bound secondary to paraplegia from fall, presenting to UC with wife and daughter who translated for pt, c/o worsening sore on his Right buttock for about 3 days.  Pt was seen by his PCP yesterday and given a shot of antibiotics as well as started on oral Bactrim, which he has been taking as prescribed.  He was advised to come to UC for further evaluation and treatment of symptoms as family reports sore appears to have worsened. Wife notes area of redness seems to have spread. No fever, n/v/d.  No prior hx of similar sores or abscesses.    Past Medical History:  Diagnosis Date  . Diabetes mellitus   . Hypertension   . Paraplegia (HCC) 2013   fell from ladder  . SCI (spinal cord injury)   . Spine fracture 11/16/2011   T 11- T9-L1   Past Surgical History:  Procedure Laterality Date  . CHOLECYSTECTOMY N/A 05/01/2015   Procedure: LAPAROSCOPIC CHOLECYSTECTOMY WITH ATTEMPTED INTRAOPERATIVE CHOLANGIOGRAM;  Surgeon: Violeta Gelinas, MD;  Location: [facility] OR;  Service: General;  Laterality: N/A;  . SPINE SURGERY     Family History  Problem Relation Age of Onset  . Diabetes Father    Social History  Substance Use Topics  . Smoking status: Never Smoker  . Smokeless tobacco: Never Used  . Alcohol use 0.6 oz/week    1 Cans of beer per week    Review of Systems  Constitutional: Negative for chills and fever.  Gastrointestinal: Negative for anal bleeding, blood in stool, diarrhea, nausea, rectal pain and vomiting.  Skin: Positive for color change and rash. Negative for wound.       Right buttock    Allergies  Other  Home Medications   Prior to Admission  medications   Medication Sig Start Date End Date Taking? Authorizing Provider  glimepiride (AMARYL) 2 MG tablet Take 2 mg by mouth daily with breakfast.   Yes Historical Provider, MD  lovastatin (MEVACOR) 10 MG tablet Take 10 mg by mouth at bedtime.   Yes Historical Provider, MD  metFORMIN (GLUCOPHAGE) 850 MG tablet Take 850 mg by mouth 2 (two) times daily with a meal.    Yes Historical Provider, MD  omeprazole (PRILOSEC) 20 MG capsule Take 20 mg by mouth daily.   Yes Historical Provider, MD  sulfamethoxazole-trimethoprim (BACTRIM DS,SEPTRA DS) 800-160 MG tablet Take 1 tablet by mouth 2 (two) times daily.   Yes Historical Provider, MD  cefdinir (OMNICEF) 300 MG capsule Take 1 capsule (300 mg total) by mouth 2 (two) times daily. 12/30/15   Tilden Fossa, MD  cephALEXin (KEFLEX) 500 MG capsule Take 1 capsule (500 mg total) by mouth 3 (three) times daily. For 7 days 11/01/16   Junius Finner, PA-C  HYDROcodone-acetaminophen (NORCO/VICODIN) 5-325 MG per tablet Take 1-2 tablets by mouth every 4 (four) hours as needed for moderate pain or severe pain. 05/06/15   Drema Dallas, MD  Insulin Glargine (TOUJEO SOLOSTAR) 300 UNIT/ML SOPN Inject 48 Units into the skin daily. 05/06/15   Drema Dallas, MD  ondansetron (ZOFRAN) 4 MG tablet Take 1 tablet (4 mg total) by mouth every 6 (six) hours. 12/30/15   Tilden Fossa, MD  pantoprazole (PROTONIX) 40 MG tablet Take 40 mg by mouth daily.    Historical Provider, MD   Meds Ordered and Administered this Visit   Medications  cefTRIAXone (ROCEPHIN) injection 1 g (1 g Intramuscular Given 11/01/16 1831)    BP 145/81 (BP Location: Left Arm)   Pulse 98   Temp 99.2 F (37.3 C) (Oral)   Resp 17   SpO2 100%  No data found.   Physical Exam  Constitutional: He is oriented to person, place, and time. He appears well-developed and well-nourished.  HENT:  Head: Normocephalic and atraumatic.  Eyes: EOM are normal.  Neck: Normal range of motion.  Cardiovascular: Normal  rate.   Pulmonary/Chest: Effort normal.  Genitourinary:  Genitourinary Comments: Assistance by Remer MachoBrent Wooters, CMA with pt transfer and exam. 4-5cm area of erythema and induration on Right buttock an perineal region. No fluctuance. No bleeding or discharge.   Musculoskeletal: Normal range of motion.  Neurological: He is alert and oriented to person, place, and time.  Skin: Skin is warm and dry. Capillary refill takes less than 2 seconds. There is erythema.  (see GU exam)  Psychiatric: He has a normal mood and affect. His behavior is normal.  Nursing note and vitals reviewed.   Urgent Care Course     Procedures (including critical care time)  Labs Review Labs Reviewed - No data to display  Imaging Review No results found.    MDM   1. Cellulitis of right buttock    Exam c/w cellulitis w/o systemic symptoms.  Tx in UC: Rocephin 1g IM   Rx: Keflex 500mg  TID for 7 days in addition to the bactrim he was prescribed yesterday. Encouraged f/u with PCP tomorrow for recheck of symptoms or return to UC if needed.    Junius Finnerrin O'Malley, PA-C 11/01/16 [id number]

## 2016-11-02 ENCOUNTER — Inpatient Hospital Stay (HOSPITAL_COMMUNITY)
Admission: EM | Admit: 2016-11-02 | Discharge: 2016-11-05 | DRG: 603 | Disposition: A | Discharge status: Home / Self Care | Payer: Medicaid Other | Attending: Internal Medicine | Admitting: Internal Medicine

## 2016-11-02 ENCOUNTER — Encounter (HOSPITAL_COMMUNITY): Payer: Self-pay | Admitting: Emergency Medicine

## 2016-11-02 ENCOUNTER — Ambulatory Visit (HOSPITAL_COMMUNITY)
Admission: EM | Admit: 2016-11-02 | Discharge: 2016-11-02 | Disposition: A | Discharge status: Nursing Home (Includes ICF) | Payer: Self-pay | Attending: Family Medicine | Admitting: Family Medicine

## 2016-11-02 DIAGNOSIS — L8990 Pressure ulcer of unspecified site, unspecified stage: Secondary | ICD-10-CM | POA: Insufficient documentation

## 2016-11-02 DIAGNOSIS — I10 Essential (primary) hypertension: Secondary | ICD-10-CM | POA: Diagnosis present

## 2016-11-02 DIAGNOSIS — L03315 Cellulitis of perineum: Secondary | ICD-10-CM | POA: Diagnosis not present

## 2016-11-02 DIAGNOSIS — E1165 Type 2 diabetes mellitus with hyperglycemia: Secondary | ICD-10-CM | POA: Diagnosis present

## 2016-11-02 DIAGNOSIS — Z79899 Other long term (current) drug therapy: Secondary | ICD-10-CM

## 2016-11-02 DIAGNOSIS — Z7984 Long term (current) use of oral hypoglycemic drugs: Secondary | ICD-10-CM

## 2016-11-02 DIAGNOSIS — E785 Hyperlipidemia, unspecified: Secondary | ICD-10-CM | POA: Diagnosis present

## 2016-11-02 DIAGNOSIS — E871 Hypo-osmolality and hyponatremia: Secondary | ICD-10-CM | POA: Diagnosis present

## 2016-11-02 DIAGNOSIS — G8220 Paraplegia, unspecified: Secondary | ICD-10-CM | POA: Diagnosis present

## 2016-11-02 DIAGNOSIS — Z888 Allergy status to other drugs, medicaments and biological substances status: Secondary | ICD-10-CM

## 2016-11-02 DIAGNOSIS — Z881 Allergy status to other antibiotic agents status: Secondary | ICD-10-CM

## 2016-11-02 DIAGNOSIS — Z833 Family history of diabetes mellitus: Secondary | ICD-10-CM

## 2016-11-02 DIAGNOSIS — L03317 Cellulitis of buttock: Principal | ICD-10-CM | POA: Diagnosis present

## 2016-11-02 DIAGNOSIS — Z792 Long term (current) use of antibiotics: Secondary | ICD-10-CM

## 2016-11-02 HISTORY — DX: Cutaneous abscess of buttock: L02.31

## 2016-11-02 HISTORY — DX: Cellulitis of buttock: L03.317

## 2016-11-02 LAB — CBC WITH DIFFERENTIAL/PLATELET
BASOS ABS: 0 10*3/uL (ref 0.0–0.1)
BASOS PCT: 0 %
EOS ABS: 0.1 10*3/uL (ref 0.0–0.7)
Eosinophils Relative: 1 %
HCT: 38.1 % — ABNORMAL LOW (ref 39.0–52.0)
HEMOGLOBIN: 13 g/dL (ref 13.0–17.0)
Lymphocytes Relative: 19 %
Lymphs Abs: 2 10*3/uL (ref 0.7–4.0)
MCH: 29.1 pg (ref 26.0–34.0)
MCHC: 34.1 g/dL (ref 30.0–36.0)
MCV: 85.4 fL (ref 78.0–100.0)
Monocytes Absolute: 0.9 10*3/uL (ref 0.1–1.0)
Monocytes Relative: 8 %
NEUTROS PCT: 72 %
Neutro Abs: 7.6 10*3/uL (ref 1.7–7.7)
Platelets: 217 10*3/uL (ref 150–400)
RBC: 4.46 MIL/uL (ref 4.22–5.81)
RDW: 13.3 % (ref 11.5–15.5)
WBC: 10.5 10*3/uL (ref 4.0–10.5)

## 2016-11-02 LAB — BASIC METABOLIC PANEL
Anion gap: 12 (ref 5–15)
BUN: 8 mg/dL (ref 6–20)
CHLORIDE: 96 mmol/L — AB (ref 101–111)
CO2: 23 mmol/L (ref 22–32)
Calcium: 8.8 mg/dL — ABNORMAL LOW (ref 8.9–10.3)
Creatinine, Ser: 0.72 mg/dL (ref 0.61–1.24)
GFR calc non Af Amer: >60 mL/min (ref >60)
Glucose, Bld: 324 mg/dL — ABNORMAL HIGH (ref 65–99)
POTASSIUM: 3.9 mmol/L (ref 3.5–5.1)
SODIUM: 131 mmol/L — AB (ref 135–145)

## 2016-11-02 LAB — I-STAT CG4 LACTIC ACID, ED
LACTIC ACID, VENOUS: 2.17 mmol/L — AB (ref 0.5–1.9)
Lactic Acid, Venous: 2.1 mmol/L (ref 0.5–1.9)

## 2016-11-02 MED ORDER — SODIUM CHLORIDE 0.9 % IV BOLUS (SEPSIS)
1000.0000 mL | Freq: Once | INTRAVENOUS | Status: AC
  Administered 2016-11-03: 1000 mL via INTRAVENOUS

## 2016-11-02 MED ORDER — VANCOMYCIN HCL IN DEXTROSE 1-5 GM/200ML-% IV SOLN
1000.0000 mg | Freq: Once | INTRAVENOUS | Status: AC
  Administered 2016-11-03: 1000 mg via INTRAVENOUS
  Filled 2016-11-02: qty 200

## 2016-11-02 NOTE — ED Triage Notes (Signed)
Patient has an abscess that was evaluated on 1/21.  Site has worsened .  Provider has reviewed patient and sending to ed.

## 2016-11-02 NOTE — ED Triage Notes (Signed)
[facility] and had it drained; pt sts increased pain and swelling and sent here for further eval; pt is wheel chair bound

## 2016-11-02 NOTE — Discharge Instructions (Signed)
I am concerned the infection is spreading. It appear larger than the area described in his chart from his visit yesterday. Given he has had Bactrim, Keflex, and rocephin and his infection continues to grow, I believe it is warranted to be seen at the ER for cellulitis that may be resistant to multiple forms of antibiotics.

## 2016-11-02 NOTE — ED Provider Notes (Signed)
CSN: [id number]     Arrival date & time 11/02/16  1522 History   None    No chief complaint on file.  (Consider location/radiation/quality/duration/timing/severity/associated sxs/prior Treatment) 56 year old male presents to clinic with chief complaint of perineal cellulitis. 2 days ago he was seen by primary care and started on Bactrim, he was seen here yesterday, continued on bactrim but also started on Keflex and given a gram of rocephin and his infection site continues to grow. The note from yesterday indicated a 4-5cm area of erythema and induration on right buttock an perineal region. No fluctuance. No bleeding or discharge.  His wife states that the infection has spread beyond this area. They deny fever, nausea, or systemic symptoms. He is a paraplegic, wheelchair bound.   The history is provided by the patient and the spouse. The history is limited by a language barrier. No language interpreter was used.    Past Medical History:  Diagnosis Date  . Diabetes mellitus   . Hypertension   . Paraplegia (HCC) 2013   fell from ladder  . SCI (spinal cord injury)   . Spine fracture 11/16/2011   T 11- T9-L1   Past Surgical History:  Procedure Laterality Date  . CHOLECYSTECTOMY N/A 05/01/2015   Procedure: LAPAROSCOPIC CHOLECYSTECTOMY WITH ATTEMPTED INTRAOPERATIVE CHOLANGIOGRAM;  Surgeon: Violeta Gelinas, MD;  Location: [facility] OR;  Service: General;  Laterality: N/A;  . SPINE SURGERY     Family History  Problem Relation Age of Onset  . Diabetes Father    Social History  Substance Use Topics  . Smoking status: Never Smoker  . Smokeless tobacco: Never Used  . Alcohol use 0.6 oz/week    1 Cans of beer per week    Review of Systems  Reason unable to perform ROS: as covered in HPI.  Skin: Positive for rash (redness in groin).  All other systems reviewed and are negative.   Allergies  Other  Home Medications   Prior to Admission medications   Medication Sig Start Date End Date  Taking? Authorizing Provider  cefdinir (OMNICEF) 300 MG capsule Take 1 capsule (300 mg total) by mouth 2 (two) times daily. 12/30/15   Tilden Fossa, MD  cephALEXin (KEFLEX) 500 MG capsule Take 1 capsule (500 mg total) by mouth 3 (three) times daily. For 7 days 11/01/16   Junius Finner, PA-C  glimepiride (AMARYL) 2 MG tablet Take 2 mg by mouth daily with breakfast.    Historical Provider, MD  HYDROcodone-acetaminophen (NORCO/VICODIN) 5-325 MG per tablet Take 1-2 tablets by mouth every 4 (four) hours as needed for moderate pain or severe pain. 05/06/15   Drema Dallas, MD  Insulin Glargine (TOUJEO SOLOSTAR) 300 UNIT/ML SOPN Inject 48 Units into the skin daily. 05/06/15   Drema Dallas, MD  lovastatin (MEVACOR) 10 MG tablet Take 10 mg by mouth at bedtime.    Historical Provider, MD  metFORMIN (GLUCOPHAGE) 850 MG tablet Take 850 mg by mouth 2 (two) times daily with a meal.     Historical Provider, MD  omeprazole (PRILOSEC) 20 MG capsule Take 20 mg by mouth daily.    Historical Provider, MD  ondansetron (ZOFRAN) 4 MG tablet Take 1 tablet (4 mg total) by mouth every 6 (six) hours. 12/30/15   Tilden Fossa, MD  pantoprazole (PROTONIX) 40 MG tablet Take 40 mg by mouth daily.    Historical Provider, MD  sulfamethoxazole-trimethoprim (BACTRIM DS,SEPTRA DS) 800-160 MG tablet Take 1 tablet by mouth 2 (two) times daily.  Historical Provider, MD   Meds Ordered and Administered this Visit  Medications - No data to display  There were no vitals taken for this visit. No data found.   Physical Exam  Constitutional: He is oriented to person, place, and time. He appears well-developed and well-nourished. No distress.  Genitourinary:     Neurological: He is alert and oriented to person, place, and time.  Skin: Skin is warm and dry. Capillary refill takes less than 2 seconds. Rash noted. He is not diaphoretic.  Psychiatric: He has a normal mood and affect.  Nursing note and vitals reviewed.   Urgent Care  Course     Procedures (including critical care time)  Labs Review Labs Reviewed - No data to display  Imaging Review No results found.   Visual Acuity Review  Right Eye Distance:   Left Eye Distance:   Bilateral Distance:    Right Eye Near:   Left Eye Near:    Bilateral Near:         MDM   1. Cellulitis of perineum    I am concerned the infection is spreading. It appear larger than the area described in his chart from his visit yesterday. Given he has had Bactrim, Keflex, and rocephin and his infection continues to grow, I believe it is warranted to be seen at the ER for cellulitis that may be resistant to multiple forms of antibiotics.       Dorena BodoLawrence Sim Choquette, NP 11/02/16 [phone]

## 2016-11-02 NOTE — ED Notes (Signed)
Dr Fayrene FearingJames given results of elevated lactic acid.

## 2016-11-02 NOTE — ED Notes (Signed)
Triage RN notified of Lactic

## 2016-11-02 NOTE — ED Provider Notes (Signed)
[facility]-EMERGENCY DEPT Provider Note   CSN: [id number] Arrival date & time: 11/02/16  1655  By signing my name below, I, Nelwyn Salisbury, attest that this documentation has been prepared under the direction and in the presence of Dione Booze, MD . Electronically Signed: Nelwyn Salisbury, Scribe. 11/02/2016. 11:38 PM.  History   Chief Complaint Chief Complaint  Patient presents with  . Wound Check   The history is provided by the patient. The history is limited by a language barrier. A language interpreter was used.    HPI Comments:  John Meza is a 56 y.o. male with pmhx of DM, paraplegia and HTN who presents to the Emergency Department complaining of persistent, constant area of erythema and swelling to his right buttock. He reports associated mild chills. Pt states that he was seen previously by Urgent Care who attempted to drain the area twice and put him on antibiotics with only temporary relief. He notes that due to his paraplegia he is unsure whether or not the area is painful, as he only has sensation from his midsection upward. Pt denies any fevers or diaphoresis.   Translator ID: [id number]  Past Medical History:  Diagnosis Date  . Diabetes mellitus   . Hypertension   . Paraplegia (HCC) 2013   fell from ladder  . SCI (spinal cord injury)   . Spine fracture 11/16/2011   T 11- T9-L1    Patient Active Problem List   Diagnosis Date Noted  . AP (abdominal pain)   . Sinus tachycardia   . Blood poisoning (HCC)   . Diabetes type 2, uncontrolled (HCC)   . Hyponatremia   . Essential hypertension   . Choledocholithiasis   . Hypomagnesemia   . Hypokalemia   . Epigastric pain   . Acute cholecystitis   . Biliary tract imaging abnormality   . Obstructive jaundice 04/30/2015  . Sepsis (HCC) 04/30/2015  . Cholecystitis 04/30/2015  . Rotator cuff tear, left 08/12/2012  . Neurogenic bladder 01/06/2012  . Neurogenic bowel 01/06/2012  . SCI (spinal cord injury) 11/20/2011  . T11  fx w/paraplegia 11/16/2011  . Concussion with brief LOC 11/16/2011  . Accidental fall from ladder 11/16/2011  . DM, UNCOMPLICATED, TYPE II, UNCONTROLLED 05/26/2007  . OBESITY, MODERATE 05/26/2007  . HYPERLIPIDEMIA 05/25/2007  . DYSPEPSIA 05/25/2007    Past Surgical History:  Procedure Laterality Date  . CHOLECYSTECTOMY N/A 05/01/2015   Procedure: LAPAROSCOPIC CHOLECYSTECTOMY WITH ATTEMPTED INTRAOPERATIVE CHOLANGIOGRAM;  Surgeon: Violeta Gelinas, MD;  Location: [facility] OR;  Service: General;  Laterality: N/A;  . SPINE SURGERY         Home Medications    Prior to Admission medications   Medication Sig Start Date End Date Taking? Authorizing Provider  glimepiride (AMARYL) 2 MG tablet Take 2 mg by mouth daily with breakfast.   Yes Historical Provider, MD  HYDROcodone-acetaminophen (NORCO/VICODIN) 5-325 MG per tablet Take 1-2 tablets by mouth every 4 (four) hours as needed for moderate pain or severe pain. 05/06/15  Yes Drema Dallas, MD  lovastatin (MEVACOR) 10 MG tablet Take 10 mg by mouth at bedtime.   Yes Historical Provider, MD  metFORMIN (GLUCOPHAGE) 850 MG tablet Take 850 mg by mouth 2 (two) times daily with a meal.    Yes Historical Provider, MD  omeprazole (PRILOSEC) 20 MG capsule Take 20 mg by mouth daily.   Yes Historical Provider, MD  ondansetron (ZOFRAN) 4 MG tablet Take 1 tablet (4 mg total) by mouth every 6 (six) hours. Patient taking differently: Take  4 mg by mouth every 8 (eight) hours as needed for nausea.  12/30/15  Yes Tilden FossaElizabeth Rees, MD  sulfamethoxazole-trimethoprim (BACTRIM DS,SEPTRA DS) 800-160 MG tablet Take 1 tablet by mouth 2 (two) times daily.   Yes Historical Provider, MD  cephALEXin (KEFLEX) 500 MG capsule Take 1 capsule (500 mg total) by mouth 3 (three) times daily. For 7 days Patient not taking: Reported on 11/02/2016 11/01/16   Junius FinnerErin O'Malley, PA-C    Family History Family History  Problem Relation Age of Onset  . Diabetes Father     Social History Social  History  Substance Use Topics  . Smoking status: Never Smoker  . Smokeless tobacco: Never Used  . Alcohol use 0.6 oz/week    1 Cans of beer per week     Allergies   Other   Review of Systems Review of Systems  Constitutional: Positive for chills. Negative for diaphoresis and fever.  Skin: Positive for wound.  All other systems reviewed and are negative.    Physical Exam Updated Vital Signs BP 154/81 (BP Location: Left Arm)   Pulse 98   Temp 98.7 F (37.1 C) (Oral)   Resp 18   SpO2 100%   Physical Exam  Constitutional: He is oriented to person, place, and time. He appears well-developed and well-nourished.  HENT:  Head: Normocephalic and atraumatic.  Eyes: EOM are normal. Pupils are equal, round, and reactive to light.  Neck: Normal range of motion. Neck supple. No JVD present.  Cardiovascular: Normal rate, regular rhythm and normal heart sounds.   No murmur heard. Pulmonary/Chest: Effort normal and breath sounds normal. He has no wheezes. He has no rales. He exhibits no tenderness.  Abdominal: Soft. Bowel sounds are normal. He exhibits no distension and no mass. There is no tenderness.  Genitourinary:  Genitourinary Comments: Uncircumcised penis, testes descended. No scrotal or perineal lesions.  Musculoskeletal: Normal range of motion. He exhibits no edema.  Lymphadenopathy:    He has no cervical adenopathy.  Neurological: He is alert and oriented to person, place, and time. No cranial nerve deficit. He exhibits normal muscle tone. Coordination normal.  Paraplegic  Skin: Skin is warm and dry. No rash noted.  Area of erythema and slight induration to inferior aspect of right buttock with minimal skin breakdown, no fluctuance   Psychiatric: He has a normal mood and affect. His behavior is normal. Judgment and thought content normal.  Nursing note and vitals reviewed.    ED Treatments / Results  DIAGNOSTIC STUDIES:  Oxygen Saturation is 100% on RA, normal by my  interpretation.    COORDINATION OF CARE:  11:50 PM Discussed treatment plan with pt at bedside which includes IV Vancomycin and pt agreed to plan.  Labs (all labs ordered are listed, but only abnormal results are displayed) Labs Reviewed  CBC WITH DIFFERENTIAL/PLATELET - Abnormal; Notable for the following:       Result Value   HCT 38.1 (*)    All other components within normal limits  BASIC METABOLIC PANEL - Abnormal; Notable for the following:    Sodium 131 (*)    Chloride 96 (*)    Glucose, Bld 324 (*)    Calcium 8.8 (*)    All other components within normal limits  SEDIMENTATION RATE - Abnormal; Notable for the following:    Sed Rate 80 (*)    All other components within normal limits  I-STAT CG4 LACTIC ACID, ED - Abnormal; Notable for the following:    Lactic Acid,  Venous 2.17 (*)    All other components within normal limits  I-STAT CG4 LACTIC ACID, ED - Abnormal; Notable for the following:    Lactic Acid, Venous 2.10 (*)    All other components within normal limits  CULTURE, BLOOD (ROUTINE X 2)  CULTURE, BLOOD (ROUTINE X 2)  I-STAT CG4 LACTIC ACID, ED    Radiology Ct Pelvis W Contrast  Result Date: 11/03/2016 CLINICAL DATA:  56 year old male with cellulitis of the right buttock. Evaluate for abscess. EXAM: CT PELVIS WITH CONTRAST TECHNIQUE: Multidetector CT imaging of the pelvis was performed using the standard protocol following the bolus administration of intravenous contrast. CONTRAST:  ISOVUE-300 IOPAMIDOL (ISOVUE-300) INJECTION 61% COMPARISON:  CT dated 04/29/2015 FINDINGS: Urinary Tract: There is irregular and trabeculated appearance of the bladder wall, likely chronic changes related to chronic bladder outlet obstruction. Diffuse thickening of the posterior bladder wall appears similar to prior CT. No stone identified in the visualized distal ureters and bladder. Bowel: No bowel dilatation within the pelvis. No inflammatory changes of the visualized bowel.  Normal appendix. Vascular/Lymphatic: No pathologically enlarged lymph nodes. No significant vascular abnormality seen. Reproductive: The prostate gland appears heterogeneous with areas of cystic changes similar to prior study. The seminal vesicles are grossly unremarkable. Other: Small fat containing left inguinal hernia. No inflammatory changes or fluid collection. Musculoskeletal: There is degenerative changes of the spine. There is inflammatory changes of the subcutaneous soft tissue of the right gluteal region. An ill-defined 3.5 x 3.5 cm area of lower attenuation within this inflammatory area noted likely representing phlegmonous changes. No drainable fluid collection or abscess identified at time. There is extension of the inflammatory process to the soft tissues abutting the right ischial tuberosity. No bone erosion or definite evidence of osteomyelitis by CT. IMPRESSION: Inflammatory changes of the subcutaneous soft tissues of the right buttock with probable phlegmon. No drainable fluid collection/abscess identified at this time. Clinical correlation and follow-up recommended. There is abutment of right discussed tuberosity by the inflammatory tissue without definite evidence of osteomyelitis by CT. Trabeculated urinary bladder suggestive of chronic bladder outlet obstruction. Heterogeneous appearance of the prostate gland with areas of cystic changes similar to prior CT. Electronically Signed   By: Elgie Collard M.D.   On: 11/03/2016 01:44    Procedures Procedures (including critical care time)  Medications Ordered in ED Medications  sodium chloride 0.9 % bolus 1,000 mL (1,000 mLs Intravenous New Bag/Given 11/03/16 0100)  vancomycin (VANCOCIN) IVPB 1000 mg/200 mL premix (0 mg Intravenous Stopped 11/03/16 0259)  iopamidol (ISOVUE-300) 61 % injection (100 mLs  Contrast Given 11/03/16 0114)     Initial Impression / Assessment and Plan / ED Course  I have reviewed the triage vital signs and the  nursing notes.  Pertinent labs & imaging results that were available during my care of the patient were reviewed by me and considered in my medical decision making (see chart for details).  Cellulitis of the left by Dr. with no clear evidence of abscess. This is failed outpatient management with both trimethoprim-sulfamethoxazole, and cephalexin. He is given a dose of vancomycin and sent for CT to look for evidence of abscess. No clinical evidence of Fournier's gangrene. CT shows no clear evidence of abscess. Case is discussed with Dr. Toniann Fail of triad hospitalists who agrees to admit the patient. Primary indication for admission is failure of outpatient management.  Final Clinical Impressions(s) / ED Diagnoses   Final diagnoses:  Cellulitis of left buttock  Paraplegia (HCC)  Hyponatremia  New Prescriptions New Prescriptions   No medications on file   I personally performed the services described in this documentation, which was scribed in my presence. The recorded information has been reviewed and is accurate.       Dione Booze, MD 11/03/16 [phone]

## 2016-11-03 ENCOUNTER — Emergency Department (HOSPITAL_COMMUNITY): Payer: Medicaid Other

## 2016-11-03 ENCOUNTER — Encounter (HOSPITAL_COMMUNITY): Payer: Self-pay

## 2016-11-03 DIAGNOSIS — I10 Essential (primary) hypertension: Secondary | ICD-10-CM | POA: Diagnosis present

## 2016-11-03 DIAGNOSIS — E1165 Type 2 diabetes mellitus with hyperglycemia: Secondary | ICD-10-CM | POA: Diagnosis not present

## 2016-11-03 DIAGNOSIS — G8220 Paraplegia, unspecified: Secondary | ICD-10-CM | POA: Diagnosis present

## 2016-11-03 DIAGNOSIS — Z888 Allergy status to other drugs, medicaments and biological substances status: Secondary | ICD-10-CM | POA: Diagnosis not present

## 2016-11-03 DIAGNOSIS — Z792 Long term (current) use of antibiotics: Secondary | ICD-10-CM | POA: Diagnosis not present

## 2016-11-03 DIAGNOSIS — E871 Hypo-osmolality and hyponatremia: Secondary | ICD-10-CM | POA: Diagnosis present

## 2016-11-03 DIAGNOSIS — Z833 Family history of diabetes mellitus: Secondary | ICD-10-CM | POA: Diagnosis not present

## 2016-11-03 DIAGNOSIS — L03317 Cellulitis of buttock: Secondary | ICD-10-CM | POA: Diagnosis present

## 2016-11-03 DIAGNOSIS — Z7984 Long term (current) use of oral hypoglycemic drugs: Secondary | ICD-10-CM | POA: Diagnosis not present

## 2016-11-03 DIAGNOSIS — E785 Hyperlipidemia, unspecified: Secondary | ICD-10-CM | POA: Diagnosis present

## 2016-11-03 DIAGNOSIS — Z881 Allergy status to other antibiotic agents status: Secondary | ICD-10-CM | POA: Diagnosis not present

## 2016-11-03 DIAGNOSIS — Z79899 Other long term (current) drug therapy: Secondary | ICD-10-CM | POA: Diagnosis not present

## 2016-11-03 LAB — CBC WITH DIFFERENTIAL/PLATELET
BASOS ABS: 0 10*3/uL (ref 0.0–0.1)
BASOS PCT: 0 %
EOS ABS: 0.1 10*3/uL (ref 0.0–0.7)
Eosinophils Relative: 1 %
HEMATOCRIT: 34 % — AB (ref 39.0–52.0)
Hemoglobin: 11.6 g/dL — ABNORMAL LOW (ref 13.0–17.0)
Lymphocytes Relative: 24 %
Lymphs Abs: 1.9 10*3/uL (ref 0.7–4.0)
MCH: 29 pg (ref 26.0–34.0)
MCHC: 34.1 g/dL (ref 30.0–36.0)
MCV: 85 fL (ref 78.0–100.0)
MONOS PCT: 10 %
Monocytes Absolute: 0.8 10*3/uL (ref 0.1–1.0)
NEUTROS ABS: 5.1 10*3/uL (ref 1.7–7.7)
NEUTROS PCT: 65 %
Platelets: 211 10*3/uL (ref 150–400)
RBC: 4 MIL/uL — AB (ref 4.22–5.81)
RDW: 13.6 % (ref 11.5–15.5)
WBC: 7.9 10*3/uL (ref 4.0–10.5)

## 2016-11-03 LAB — GLUCOSE, CAPILLARY
GLUCOSE-CAPILLARY: 237 mg/dL — AB (ref 65–99)
GLUCOSE-CAPILLARY: 265 mg/dL — AB (ref 65–99)

## 2016-11-03 LAB — SEDIMENTATION RATE: Sed Rate: 80 mm/hr — ABNORMAL HIGH (ref 0–16)

## 2016-11-03 LAB — COMPREHENSIVE METABOLIC PANEL
ALK PHOS: 84 U/L (ref 38–126)
ALT: 17 U/L (ref 17–63)
ANION GAP: 7 (ref 5–15)
AST: 16 U/L (ref 15–41)
Albumin: 2.5 g/dL — ABNORMAL LOW (ref 3.5–5.0)
BUN: 7 mg/dL (ref 6–20)
CALCIUM: 8.2 mg/dL — AB (ref 8.9–10.3)
CO2: 23 mmol/L (ref 22–32)
CREATININE: 0.56 mg/dL — AB (ref 0.61–1.24)
Chloride: 103 mmol/L (ref 101–111)
Glucose, Bld: 226 mg/dL — ABNORMAL HIGH (ref 65–99)
Potassium: 3.5 mmol/L (ref 3.5–5.1)
SODIUM: 133 mmol/L — AB (ref 135–145)
TOTAL PROTEIN: 6.4 g/dL — AB (ref 6.5–8.1)
Total Bilirubin: 0.6 mg/dL (ref 0.3–1.2)

## 2016-11-03 LAB — I-STAT CG4 LACTIC ACID, ED: Lactic Acid, Venous: 1.17 mmol/L (ref 0.5–1.9)

## 2016-11-03 LAB — CBG MONITORING, ED: Glucose-Capillary: 268 mg/dL — ABNORMAL HIGH (ref 65–99)

## 2016-11-03 MED ORDER — PIPERACILLIN-TAZOBACTAM 3.375 G IVPB 30 MIN
3.3750 g | Freq: Once | INTRAVENOUS | Status: AC
  Administered 2016-11-03: 3.375 g via INTRAVENOUS
  Filled 2016-11-03: qty 50

## 2016-11-03 MED ORDER — PIPERACILLIN-TAZOBACTAM 3.375 G IVPB
3.3750 g | Freq: Three times a day (TID) | INTRAVENOUS | Status: DC
  Administered 2016-11-03 – 2016-11-04 (×2): 3.375 g via INTRAVENOUS
  Filled 2016-11-03 (×3): qty 50

## 2016-11-03 MED ORDER — PRAVASTATIN SODIUM 20 MG PO TABS
10.0000 mg | ORAL_TABLET | Freq: Every day | ORAL | Status: DC
  Administered 2016-11-03 – 2016-11-04 (×2): 10 mg via ORAL
  Filled 2016-11-03 (×3): qty 1

## 2016-11-03 MED ORDER — VANCOMYCIN HCL IN DEXTROSE 1-5 GM/200ML-% IV SOLN: 1000.0000 mg | Freq: Once | INTRAVENOUS | Status: DC

## 2016-11-03 MED ORDER — GLIMEPIRIDE 2 MG PO TABS
2.0000 mg | ORAL_TABLET | Freq: Every day | ORAL | Status: DC
  Administered 2016-11-04: 2 mg via ORAL
  Filled 2016-11-03 (×2): qty 1

## 2016-11-03 MED ORDER — SODIUM CHLORIDE 0.9 % IV SOLN
INTRAVENOUS | Status: AC
  Administered 2016-11-03 – 2016-11-04 (×2): via INTRAVENOUS

## 2016-11-03 MED ORDER — HYDROCODONE-ACETAMINOPHEN 5-325 MG PO TABS: 1.0000 | ORAL_TABLET | ORAL | Status: DC | PRN

## 2016-11-03 MED ORDER — IOPAMIDOL (ISOVUE-300) INJECTION 61%
INTRAVENOUS | Status: AC
  Administered 2016-11-03: 100 mL
  Filled 2016-11-03: qty 100

## 2016-11-03 MED ORDER — ONDANSETRON HCL 4 MG/2ML IJ SOLN: 4.0000 mg | Freq: Four times a day (QID) | INTRAMUSCULAR | Status: DC | PRN

## 2016-11-03 MED ORDER — INSULIN ASPART 100 UNIT/ML ~~LOC~~ SOLN
0.0000 [IU] | Freq: Three times a day (TID) | SUBCUTANEOUS | Status: DC
  Administered 2016-11-03: 3 [IU] via SUBCUTANEOUS
  Administered 2016-11-04: 9 [IU] via SUBCUTANEOUS
  Administered 2016-11-04: 3 [IU] via SUBCUTANEOUS

## 2016-11-03 MED ORDER — ACETAMINOPHEN 650 MG RE SUPP: 650.0000 mg | Freq: Four times a day (QID) | RECTAL | Status: DC | PRN

## 2016-11-03 MED ORDER — PANTOPRAZOLE SODIUM 40 MG PO TBEC
40.0000 mg | DELAYED_RELEASE_TABLET | Freq: Every day | ORAL | Status: DC
  Administered 2016-11-03 – 2016-11-05 (×3): 40 mg via ORAL
  Filled 2016-11-03 (×3): qty 1

## 2016-11-03 MED ORDER — ONDANSETRON HCL 4 MG PO TABS: 4.0000 mg | ORAL_TABLET | Freq: Four times a day (QID) | ORAL | Status: DC | PRN

## 2016-11-03 MED ORDER — VANCOMYCIN HCL IN DEXTROSE 750-5 MG/150ML-% IV SOLN
750.0000 mg | Freq: Three times a day (TID) | INTRAVENOUS | Status: DC
  Administered 2016-11-03 – 2016-11-04 (×4): 750 mg via INTRAVENOUS
  Filled 2016-11-03 (×6): qty 150

## 2016-11-03 MED ORDER — ACETAMINOPHEN 325 MG PO TABS: 650.0000 mg | ORAL_TABLET | Freq: Four times a day (QID) | ORAL | Status: DC | PRN

## 2016-11-03 NOTE — H&P (Signed)
History and Physical    Holger Sokolowski ZOX:[id number] DOB: 15-Jan-1961 DOA: 11/02/2016  PCP: Danne Harbor, MD  Patient coming from: Home.  Engineer, structural used.  Chief Complaint: Right buttock erythema.  HPI: John Meza is a 56 y.o. male with history of diabetes mellitus type 2, paraplegia, hyperlipidemia presents to the ER because of persistent erythema around the right buttock area which has been worsening over the last few days. Patient had come to the ER 3 days ago and was given IV antibiotics and prescribed oral antibiotics. Patient had not taken his oral antibiotics. Due to enlarging erythematous area on his right buttock area patient came to the ER. CT scan of the pelvic area does not show any definite abscess but shows features concerning for cellulitis. Patient is being admitted for IV antibiotics and to closely follow-up for any worsening. Initial lactate levels were elevated which improved with fluid bolus. Blood sugar was also elevated. Patient does not recall his last A1c.   ED Course: IV fluids was given and CT scan of the pelvis shows no definite abscesses but features concerning for cellulitis. IV antibiotics started.  Review of Systems: As per HPI, rest all negative.   Past Medical History:  Diagnosis Date  . Diabetes mellitus   . Hypertension   . Paraplegia (HCC) 2013   fell from ladder  . SCI (spinal cord injury)   . Spine fracture 11/16/2011   T 11- T9-L1    Past Surgical History:  Procedure Laterality Date  . CHOLECYSTECTOMY N/A 05/01/2015   Procedure: LAPAROSCOPIC CHOLECYSTECTOMY WITH ATTEMPTED INTRAOPERATIVE CHOLANGIOGRAM;  Surgeon: Violeta Gelinas, MD;  Location: [facility] OR;  Service: General;  Laterality: N/A;  . SPINE SURGERY       reports that he has never smoked. He has never used smokeless tobacco. He reports that he drinks about 0.6 oz of alcohol per week . He reports that he does not use drugs.  Allergies  Allergen Reactions    . Other Itching, Rash and Other (See Comments)    ANTIBIOTIC? CAUSED RASH, ITCHING IN 2013-PER FAMILY Possibly niacin causing flushing reaction- as described by family?    Family History  Problem Relation Age of Onset  . Diabetes Father   . Diabetes Sister     Prior to Admission medications   Medication Sig Start Date End Date Taking? Authorizing Provider  glimepiride (AMARYL) 2 MG tablet Take 2 mg by mouth daily with breakfast.   Yes Historical Provider, MD  HYDROcodone-acetaminophen (NORCO/VICODIN) 5-325 MG per tablet Take 1-2 tablets by mouth every 4 (four) hours as needed for moderate pain or severe pain. 05/06/15  Yes Drema Dallas, MD  lovastatin (MEVACOR) 10 MG tablet Take 10 mg by mouth at bedtime.   Yes Historical Provider, MD  metFORMIN (GLUCOPHAGE) 850 MG tablet Take 850 mg by mouth 2 (two) times daily with a meal.    Yes Historical Provider, MD  omeprazole (PRILOSEC) 20 MG capsule Take 20 mg by mouth daily.   Yes Historical Provider, MD  ondansetron (ZOFRAN) 4 MG tablet Take 1 tablet (4 mg total) by mouth every 6 (six) hours. Patient taking differently: Take 4 mg by mouth every 8 (eight) hours as needed for nausea.  12/30/15  Yes Tilden Fossa, MD  sulfamethoxazole-trimethoprim (BACTRIM DS,SEPTRA DS) 800-160 MG tablet Take 1 tablet by mouth 2 (two) times daily.   Yes Historical Provider, MD  cephALEXin (KEFLEX) 500 MG capsule Take 1 capsule (500 mg total) by mouth 3 (three) times daily. For 7  days Patient not taking: Reported on 11/02/2016 11/01/16   Junius FinnerErin O'Malley, PA-C    Physical Exam: Vitals:   11/03/16 0415 11/03/16 0515 11/03/16 0615 11/03/16 0630  BP: 127/78 127/77 122/75 135/74  Pulse: 91 88 86 88  Resp:      Temp:      TempSrc:      SpO2: 98% 98% 95% 98%      Constitutional: Moderately built and nourished. Vitals:   11/03/16 0415 11/03/16 0515 11/03/16 0615 11/03/16 0630  BP: 127/78 127/77 122/75 135/74  Pulse: 91 88 86 88  Resp:      Temp:       TempSrc:      SpO2: 98% 98% 95% 98%   Eyes: Anicteric no pallor. ENMT: No discharge from the ears eyes nose or mouth. Neck: No mass felt. No neck rigidity. Respiratory: No rhonchi or crepitations. Cardiovascular: S1 and S2 heard. No murmurs appreciated. Abdomen: Soft nontender bowel sounds present. Musculoskeletal: No edema. Skin: Right buttock area has an erythematous area measuring around 10 cm in diameter with central denuded skin. No active discharge. Neurologic: Awake oriented to time place and person. Paraplegic. Psychiatric: Appears normal. Normal affect.   Labs on Admission: I have personally reviewed following labs and imaging studies  CBC:  Recent Labs Lab 11/02/16 1754  WBC 10.5  NEUTROABS 7.6  HGB 13.0  HCT 38.1*  MCV 85.4  PLT 217   Basic Metabolic Panel:  Recent Labs Lab 11/02/16 1754  NA 131*  K 3.9  CL 96*  CO2 23  GLUCOSE 324*  BUN 8  CREATININE 0.72  CALCIUM 8.8*   GFR: CrCl cannot be calculated (Unknown ideal weight.). Liver Function Tests: No results for input(s): AST, ALT, ALKPHOS, BILITOT, PROT, ALBUMIN in the last 168 hours. No results for input(s): LIPASE, AMYLASE in the last 168 hours. No results for input(s): AMMONIA in the last 168 hours. Coagulation Profile: No results for input(s): INR, PROTIME in the last 168 hours. Cardiac Enzymes: No results for input(s): CKTOTAL, CKMB, CKMBINDEX, TROPONINI in the last 168 hours. BNP (last 3 results) No results for input(s): PROBNP in the last 8760 hours. HbA1C: No results for input(s): HGBA1C in the last 72 hours. CBG: No results for input(s): GLUCAP in the last 168 hours. Lipid Profile: No results for input(s): CHOL, HDL, LDLCALC, TRIG, CHOLHDL, LDLDIRECT in the last 72 hours. Thyroid Function Tests: No results for input(s): TSH, T4TOTAL, FREET4, T3FREE, THYROIDAB in the last 72 hours. Anemia Panel: No results for input(s): VITAMINB12, FOLATE, FERRITIN, TIBC, IRON, RETICCTPCT in the  last 72 hours. Urine analysis:    Component Value Date/Time   COLORURINE AMBER (A) 12/30/2015 1724   APPEARANCEUR TURBID (A) 12/30/2015 1724   LABSPEC 1.023 12/30/2015 1724   PHURINE 5.5 12/30/2015 1724   GLUCOSEU 500 (A) 12/30/2015 1724   HGBUR LARGE (A) 12/30/2015 1724   BILIRUBINUR SMALL (A) 12/30/2015 1724   KETONESUR >80 (A) 12/30/2015 1724   PROTEINUR 100 (A) 12/30/2015 1724   UROBILINOGEN 1.0 04/30/2015 1120   NITRITE POSITIVE (A) 12/30/2015 1724   LEUKOCYTESUR LARGE (A) 12/30/2015 1724   Sepsis Labs: @LABRCNTIP (procalcitonin:4,lacticidven:4) )No results found for this or any previous visit (from the past 240 hour(s)).   Radiological Exams on Admission: Ct Pelvis W Contrast  Result Date: 11/03/2016 CLINICAL DATA:  56 year old male with cellulitis of the right buttock. Evaluate for abscess. EXAM: CT PELVIS WITH CONTRAST TECHNIQUE: Multidetector CT imaging of the pelvis was performed using the standard protocol following the bolus  administration of intravenous contrast. CONTRAST:  ISOVUE-300 IOPAMIDOL (ISOVUE-300) INJECTION 61% COMPARISON:  CT dated 04/29/2015 FINDINGS: Urinary Tract: There is irregular and trabeculated appearance of the bladder wall, likely chronic changes related to chronic bladder outlet obstruction. Diffuse thickening of the posterior bladder wall appears similar to prior CT. No stone identified in the visualized distal ureters and bladder. Bowel: No bowel dilatation within the pelvis. No inflammatory changes of the visualized bowel. Normal appendix. Vascular/Lymphatic: No pathologically enlarged lymph nodes. No significant vascular abnormality seen. Reproductive: The prostate gland appears heterogeneous with areas of cystic changes similar to prior study. The seminal vesicles are grossly unremarkable. Other: Small fat containing left inguinal hernia. No inflammatory changes or fluid collection. Musculoskeletal: There is degenerative changes of the spine. There  is inflammatory changes of the subcutaneous soft tissue of the right gluteal region. An ill-defined 3.5 x 3.5 cm area of lower attenuation within this inflammatory area noted likely representing phlegmonous changes. No drainable fluid collection or abscess identified at time. There is extension of the inflammatory process to the soft tissues abutting the right ischial tuberosity. No bone erosion or definite evidence of osteomyelitis by CT. IMPRESSION: Inflammatory changes of the subcutaneous soft tissues of the right buttock with probable phlegmon. No drainable fluid collection/abscess identified at this time. Clinical correlation and follow-up recommended. There is abutment of right discussed tuberosity by the inflammatory tissue without definite evidence of osteomyelitis by CT. Trabeculated urinary bladder suggestive of chronic bladder outlet obstruction. Heterogeneous appearance of the prostate gland with areas of cystic changes similar to prior CT. Electronically Signed   By: Elgie Collard M.D.   On: 11/03/2016 01:44     Assessment/Plan Active Problems:   HLD (hyperlipidemia)   Cellulitis of right buttock   Uncontrolled type 2 diabetes mellitus with hyperglycemia (HCC)    1. Cellulitis of the right buttock - due to enlarging erythematous area. Keep patient on IV antibiotics. I have placed patient on vancomycin and Zosyn. Closely observe for any developing abscess. CT does not show any definite abscess at this time. No signs of necrotizing fasciitis. 2. Diabetes mellitus type 2 uncontrolled with hyperglycemia - we'll hold metformin and continue glimepiride. I have placed patient on sliding scale coverage. Closely follow CBGs patient may need more aggressive diabetic control. Patient does not recall his last A1c. 3. History of paraplegia status post spinal injury - No acute issues at this time. 4. Hyperlipidemia on statins.    DVT prophylaxis: SCDs for now in anticipation of possible procedure.  If there is no further worsening of the cellulitis after antibiotics may place patient on Lovenox. Code Status: Full code.  Family Communication: Patient's wife.  Disposition Plan: Home.  Consults called: None.  Admission status: Inpatient.    Eduard Clos MD [facility] Pager [phone].  If 7PM-7AM, please contact night-coverage [email or web address] Password [email or web address]  11/03/2016, 7:05 AM

## 2016-11-03 NOTE — ED Notes (Signed)
Pt's CBG result was 268. Informed Marylene LandAngela - RN.

## 2016-11-03 NOTE — ED Notes (Signed)
Pt in CT at this time.

## 2016-11-03 NOTE — ED Notes (Signed)
Dr K at bedside.

## 2016-11-03 NOTE — Progress Notes (Signed)
Inpatient Diabetes Program Recommendations  AACE/ADA: New Consensus Statement on Inpatient Glycemic Control (2015)  Target Ranges:  Prepandial:   less than 140 mg/dL      Peak postprandial:   less than 180 mg/dL (1-2 hours)      Critically ill patients:  140 - 180 mg/dL   Lab Results  Component Value Date   GLUCAP 272 (H) 12/30/2015   HGBA1C 8.7 (H) 05/05/2015    Review of Glycemic Control  Diabetes history:     DM2 Outpatient Diabetes medications:     Amaryl 2 mg daily,     Metformin 850 mg BID Current orders for Inpatient glycemic control:     Amaryl 2 mg daily,     Sensitive correction scale Novolog 0-9 units [facility]   Inpatient Diabetes Program Recommendations:     Please consider stopping Amaryl while inpatient.        A1C in 2016 of 8.7% is approximately an average CBG over 200 at that time.      Per ADA recommendations "consider performing an A1C on all patients with diabetes or hyperglycemia admitted to the hospital if not performed in the prior 3 months".  Thank you,  Kristine LineaKaren Ramesses Crampton, RN, MSN Diabetes Coordinator Inpatient Diabetes Program [phone] (Team Pager)

## 2016-11-03 NOTE — Progress Notes (Signed)
Patient admitted by Dr Toniann FailKakrakandy this morning.  Patient doesn't have any complaints at this time. He denies having such symptoms in the past. He has currently failed outpatient treatment with PO Abx.  I will consult Wound care.   Used the [employer] Grouptranslation Line [phone])

## 2016-11-03 NOTE — Progress Notes (Signed)
Pharmacy Antibiotic Note  John PavlovJose Meza is a 56 y.o. male admitted on 11/02/2016 with cellulitis.  Pharmacy has been consulted for vancomycin and zosyn dosing. PMH DM, paraplegia (SCI fell from ladder) , HTN w/ persistant erythema and swelling of R buttock.  [facility] and rx with abx.  Vanc 1 gm given at 0105 am. WBC WNL, Creat 0.72 (paraplegic) Sed rate 80, Lactate 2.1>1.17. AF. Wt on 12/30/15 - 72.6 kg. CT abd/pelvis 1/23: Inflammatory changes of the subcutaneous soft tissues of the right buttock with probable phlegmon. No drainable fluid collection/abscess identified at this time. Clinical correlation and follow-up recommended. There is abutment of right discussed tuberosity by the inflammatory tissue without definite evidence of osteomyelitis by CT.  Plan: Vancomycin 750 mg IV every 8 hours.  Goal trough 10-15 mcg/mL. Zosyn 3.375g IV q8h (4 hour infusion).  F/u renal fxn, wbc, temp, culture data Vancomycin levels as needed    Temp (24hrs), Avg:98.7 F (37.1 C), Min:98.6 F (37 C), Max:98.7 F (37.1 C)   Recent Labs Lab 11/02/16 1754 11/02/16 1818 11/02/16 2202 11/03/16 0109  WBC 10.5  --   --   --   CREATININE 0.72  --   --   --   LATICACIDVEN  --  2.17* 2.10* 1.17    CrCl cannot be calculated (Unknown ideal weight.).    Allergies  Allergen Reactions  . Other Itching, Rash and Other (See Comments)    ANTIBIOTIC? CAUSED RASH, ITCHING IN 2013-PER FAMILY Possibly niacin causing flushing reaction- as described by family?   Herby AbrahamMichelle T. Yanai Hobson, Pharm.D. [phone] 11/03/2016 7:27 AM

## 2016-11-04 ENCOUNTER — Telehealth: Payer: Self-pay

## 2016-11-04 ENCOUNTER — Encounter (HOSPITAL_COMMUNITY): Payer: Self-pay | Admitting: General Practice

## 2016-11-04 DIAGNOSIS — L8990 Pressure ulcer of unspecified site, unspecified stage: Secondary | ICD-10-CM | POA: Insufficient documentation

## 2016-11-04 DIAGNOSIS — L89312 Pressure ulcer of right buttock, stage 2: Secondary | ICD-10-CM

## 2016-11-04 DIAGNOSIS — E1165 Type 2 diabetes mellitus with hyperglycemia: Secondary | ICD-10-CM

## 2016-11-04 DIAGNOSIS — L03317 Cellulitis of buttock: Principal | ICD-10-CM

## 2016-11-04 LAB — GLUCOSE, CAPILLARY
GLUCOSE-CAPILLARY: 223 mg/dL — AB (ref 65–99)
GLUCOSE-CAPILLARY: 353 mg/dL — AB (ref 65–99)
GLUCOSE-CAPILLARY: 167 mg/dL — AB (ref 65–99)
GLUCOSE-CAPILLARY: 241 mg/dL — AB (ref 65–99)

## 2016-11-04 MED ORDER — AMOXICILLIN-POT CLAVULANATE 875-125 MG PO TABS
1.0000 | ORAL_TABLET | Freq: Two times a day (BID) | ORAL | Status: DC
  Administered 2016-11-04 – 2016-11-05 (×3): 1 via ORAL
  Filled 2016-11-04 (×3): qty 1

## 2016-11-04 MED ORDER — SULFAMETHOXAZOLE-TRIMETHOPRIM 800-160 MG PO TABS
1.0000 | ORAL_TABLET | Freq: Two times a day (BID) | ORAL | Status: DC
  Administered 2016-11-04 – 2016-11-05 (×3): 1 via ORAL
  Filled 2016-11-04 (×3): qty 1

## 2016-11-04 MED ORDER — INSULIN ASPART 100 UNIT/ML ~~LOC~~ SOLN
4.0000 [IU] | Freq: Three times a day (TID) | SUBCUTANEOUS | Status: DC
  Administered 2016-11-04 – 2016-11-05 (×2): 4 [IU] via SUBCUTANEOUS

## 2016-11-04 MED ORDER — INSULIN GLARGINE 100 UNIT/ML ~~LOC~~ SOLN
15.0000 [IU] | Freq: Every day | SUBCUTANEOUS | Status: DC
  Administered 2016-11-04 – 2016-11-05 (×2): 15 [IU] via SUBCUTANEOUS
  Filled 2016-11-04 (×2): qty 0.15

## 2016-11-04 MED ORDER — PNEUMOCOCCAL VAC POLYVALENT 25 MCG/0.5ML IJ INJ
0.5000 mL | INJECTION | INTRAMUSCULAR | Status: AC
  Administered 2016-11-05: 0.5 mL via INTRAMUSCULAR
  Filled 2016-11-04: qty 0.5

## 2016-11-04 MED ORDER — INSULIN ASPART 100 UNIT/ML ~~LOC~~ SOLN
0.0000 [IU] | Freq: Three times a day (TID) | SUBCUTANEOUS | Status: DC
Start: 2016-11-04 — End: 2016-11-05
  Administered 2016-11-04 – 2016-11-05 (×2): 2 [IU] via SUBCUTANEOUS

## 2016-11-04 NOTE — Consult Note (Addendum)
WOC Nurse wound consult note Reason for Consult: Consult requested for buttocks.  Pt was noted to have erythremia and cellulitis to this location upon admission.  CT scan did not reveal a drainable abscess. Wound type: Previous cellulitis has evolved into a partial thickness wound, 2X2X.1cm, red and dry with loose peeling skin surrounding the edges.  Probed with a swab and there is no fluctuance or drainage.  Generalized erythremia surrounding the site. Dressing procedure/placement/frequency: Air mattress to decrease pressure.  Foam dressing to protect from further injury and promote healing. Discussed plan of care with patient and family member at the bedside in limited [place]. Please re-consult if further assistance is needed.  Mardee Postinhank-you,  Tidus Upchurch MSN, RN, CWOCN, [place] OakWCN-AP, CNS [phone] .

## 2016-11-04 NOTE — Care Management Note (Signed)
Case Management Note  Patient Details  Name: John Meza MRN: [id number] Date of Birth: 11-Mar-1961  Subjective/Objective:     Spanish speaking pt      admitted with R buttock cellulitis.  Hx of  HLD, Cellulitis of right buttock, diabetes,spinal cord injury/ paraplegic ( wheelchair bound). Resides with wife /family. Wife states she assists pt with ADL's. DME: Wheelchair, BSC, shower chair. Pt without health insurance. CM explained and provided pt with information regarding [facility]. Pt/wife are interested. CM made contact with Transitional Care Nurse Erskine SquibbJane  @ [phone] to [facility]  Jane to f/u with CM.      Mayrani Felipa FurnaceGarcia (Daughter) Evangeline DakinMaria Rico (Spouse)    [phone]      PCP: Marjorie Smolderamses Casanovas-Vega  Action/Plan: Plan is to d/c to home with home health services(RN) when medically stable. CM to f/u with disposition needs.  Expected Discharge Date:                  Expected Discharge Plan:  Home w Home Health Services  In-House Referral:     Discharge planning Services  CM Consult  Post Acute Care Choice:    Choice offered to:   [facility](Charity Case)  DME Arranged:    DME Agency:     HH Arranged:  RN HH Agency:  [facility] @ [phone], charity case.  Status of Service:  In process, will continue to follow  If discussed at Long Length of Stay Meetings, dates discussed:    Additional Comments:  Epifanio LeschesCole, Maurya Nethery Hudson, RN 11/04/2016, 11:15 AM

## 2016-11-04 NOTE — Progress Notes (Signed)
Interpreter [name] for Dr Nelson ChimesAmin

## 2016-11-04 NOTE — Progress Notes (Signed)
PROGRESS NOTE    John Meza  ZOX:[id number] DOB: [birth year] DOA: 11/02/2016 PCP: Danne Harbor, MD   Brief Narrative:   Patient admitted for cellulitis/abscess of his rectal area as he had failed outpatient therapy.   Assessment & Plan:   Active Problems:   HLD (hyperlipidemia)   Cellulitis of right buttock   Uncontrolled type 2 diabetes mellitus with hyperglycemia (HCC)   Pressure injury of skin  Cellulitis of Right Buttock Area -CT Pelvis done- no drainable abscess. Will switch his Abx to oral today and see how he does - Augmentin and Bactrim.  -wound care has been consulted.  -micro - pending -I have advised him on how to avoid sitting in one position for long periods of time. He is going to have to switch position frequently.   Dm2 -hold metformin, cont other home regimen of Glimepiride.  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   HLD -on statin   DVT prophylaxis: SCDs Code Status: Full code Family Communication:  Wife at bedisde Disposition Plan: Possible discharge tomorrow.   Consultants:   Wound care   Procedures:   none  Antimicrobials:   Vanc/Zosyn    Subjective: Interpreter used - Wyvonnia Dusky Patient doesn't have any complaints. He states his pain is little better but still shows concerns of any healing and feeling like "there is a ball" Remains afebrile.   Objective: Vitals:   11/03/16 1857 11/03/16 1923 11/03/16 2316 11/04/16 0439  BP: 125/70  (!) 114/59 117/63  Pulse: 82  79 77  Resp: 16  18 20   Temp: 98.5 F (36.9 C)  97.9 F (36.6 C) 98.8 F (37.1 C)  TempSrc: Oral  Oral Oral  SpO2: 99%  100% 98%  Weight:  76.9 kg (169 lb 8 oz)    Height:  5\' 5"  (1.651 m)      Intake/Output Summary (Last 24 hours) at 11/04/16 1152 Last data filed at 11/04/16 0705  Gross per 24 hour  Intake          3027.08 ml  Output             1500 ml  Net          1527.08 ml   Filed Weights   11/03/16  1923  Weight: 76.9 kg (169 lb 8 oz)    Examination:  General exam: Appears calm and comfortable  Respiratory system: Clear to auscultation. Respiratory effort normal. Cardiovascular system: S1 & S2 heard, RRR. No JVD, murmurs, rubs, gallops or clicks. No pedal edema. Gastrointestinal system: Abdomen is nondistended, soft and nontender. No organomegaly or masses felt. Normal bowel sounds heard. Central nervous system: Alert and oriented. No focal neurological deficits. Extremities: paraplegia Skin: Right buttock area has an erythematous area measuring around 10 cm in diameter with central denuded skin Psychiatry: Judgement and insight appear normal. Mood & affect appropriate.     Data Reviewed:   CBC:  Recent Labs Lab 11/02/16 1754 11/03/16 0721  WBC 10.5 7.9  NEUTROABS 7.6 5.1  HGB 13.0 11.6*  HCT 38.1* 34.0*  MCV 85.4 85.0  PLT 217 211   Basic Metabolic Panel:  Recent Labs Lab 11/02/16 1754 11/03/16 0721  NA 131* 133*  K 3.9 3.5  CL 96* 103  CO2 23 23  GLUCOSE 324* 226*  BUN 8 7  CREATININE 0.72 0.56*  CALCIUM 8.8* 8.2*   GFR: Estimated Creatinine Clearance: 102.3 mL/min (by C-G formula based on SCr of 0.56 mg/dL (L)). Liver Function Tests:  Recent Labs Lab 11/03/16 0721  AST 16  ALT 17  ALKPHOS 84  BILITOT 0.6  PROT 6.4*  ALBUMIN 2.5*   No results for input(s): LIPASE, AMYLASE in the last 168 hours. No results for input(s): AMMONIA in the last 168 hours. Coagulation Profile: No results for input(s): INR, PROTIME in the last 168 hours. Cardiac Enzymes: No results for input(s): CKTOTAL, CKMB, CKMBINDEX, TROPONINI in the last 168 hours. BNP (last 3 results) No results for input(s): PROBNP in the last 8760 hours. HbA1C: No results for input(s): HGBA1C in the last 72 hours. CBG:  Recent Labs Lab 11/03/16 1229 11/03/16 1831 11/03/16 2322 11/04/16 0806  GLUCAP 268* 237* 265* 223*   Lipid Profile: No results for input(s): CHOL, HDL, LDLCALC,  TRIG, CHOLHDL, LDLDIRECT in the last 72 hours. Thyroid Function Tests: No results for input(s): TSH, T4TOTAL, FREET4, T3FREE, THYROIDAB in the last 72 hours. Anemia Panel: No results for input(s): VITAMINB12, FOLATE, FERRITIN, TIBC, IRON, RETICCTPCT in the last 72 hours. Sepsis Labs:  Recent Labs Lab 11/02/16 1818 11/02/16 2202 11/03/16 0109  LATICACIDVEN 2.17* 2.10* 1.17    No results found for this or any previous visit (from the past 240 hour(s)).       Radiology Studies: Ct Pelvis W Contrast  Result Date: 11/03/2016 CLINICAL DATA:  56 year old male with cellulitis of the right buttock. Evaluate for abscess. EXAM: CT PELVIS WITH CONTRAST TECHNIQUE: Multidetector CT imaging of the pelvis was performed using the standard protocol following the bolus administration of intravenous contrast. CONTRAST:  ISOVUE-300 IOPAMIDOL (ISOVUE-300) INJECTION 61% COMPARISON:  CT dated 04/29/2015 FINDINGS: Urinary Tract: There is irregular and trabeculated appearance of the bladder wall, likely chronic changes related to chronic bladder outlet obstruction. Diffuse thickening of the posterior bladder wall appears similar to prior CT. No stone identified in the visualized distal ureters and bladder. Bowel: No bowel dilatation within the pelvis. No inflammatory changes of the visualized bowel. Normal appendix. Vascular/Lymphatic: No pathologically enlarged lymph nodes. No significant vascular abnormality seen. Reproductive: The prostate gland appears heterogeneous with areas of cystic changes similar to prior study. The seminal vesicles are grossly unremarkable. Other: Small fat containing left inguinal hernia. No inflammatory changes or fluid collection. Musculoskeletal: There is degenerative changes of the spine. There is inflammatory changes of the subcutaneous soft tissue of the right gluteal region. An ill-defined 3.5 x 3.5 cm area of lower attenuation within this inflammatory area noted likely  representing phlegmonous changes. No drainable fluid collection or abscess identified at time. There is extension of the inflammatory process to the soft tissues abutting the right ischial tuberosity. No bone erosion or definite evidence of osteomyelitis by CT. IMPRESSION: Inflammatory changes of the subcutaneous soft tissues of the right buttock with probable phlegmon. No drainable fluid collection/abscess identified at this time. Clinical correlation and follow-up recommended. There is abutment of right discussed tuberosity by the inflammatory tissue without definite evidence of osteomyelitis by CT. Trabeculated urinary bladder suggestive of chronic bladder outlet obstruction. Heterogeneous appearance of the prostate gland with areas of cystic changes similar to prior CT. Electronically Signed   By: Elgie Collard M.D.   On: 11/03/2016 01:44        Scheduled Meds: . glimepiride  2 mg Oral Q breakfast  . insulin aspart  0-9 Units Subcutaneous TID WC  . pantoprazole  40 mg Oral Daily  . piperacillin-tazobactam (ZOSYN)  IV  3.375 g Intravenous Q8H  . [START ON 11/05/2016] pneumococcal 23 valent vaccine  0.5 mL Intramuscular Tomorrow-1000  .  pravastatin  10 mg Oral q1800  . vancomycin  750 mg Intravenous Q8H   Continuous Infusions:   LOS: 1 day    Time spent: 35 mins     Ramar Nobrega Joline Maxcyhirag Adisa Litt, MD [facility] Pager [phone]   If 7PM-7AM, please contact night-coverage [email or web address] Password [email or web address] 11/04/2016, 11:52 AM )

## 2016-11-04 NOTE — Progress Notes (Signed)
Inpatient Diabetes Program Recommendations  AACE/ADA: New Consensus Statement on Inpatient Glycemic Control (2015)  Target Ranges:  Prepandial:   less than 140 mg/dL      Peak postprandial:   less than 180 mg/dL (1-2 hours)      Critically ill patients:  140 - 180 mg/dL   Lab Results  Component Value Date   GLUCAP 353 (H) 11/04/2016   HGBA1C 8.7 (H) 05/05/2015    Review of Glycemic ControlResults for John PavlovGARCIA-MORALES, Amin (MRN [id number]) as of 11/04/2016 13:26  Ref. Range 11/03/2016 12:29 11/03/2016 18:31 11/03/2016 23:22 11/04/2016 08:06 11/04/2016 11:50  Glucose-Capillary Latest Ref Range: 65 - 99 mg/dL 086268 (H) 578237 (H) 469265 (H) 223 (H) 353 (H)   Diabetes history: Type 2 diabetes Outpatient Diabetes medications: Amaryl 2 mg daily, Metformin 850 mg bid Current orders for Inpatient glycemic control:  Novolog sensitive tid with meals   Inpatient Diabetes Program Recommendations:    Blood sugars greater than hospital goal.  Please consider adding Lantus 15 units daily and Novolog meal coverage 4 units tid with meals (hold if patient eats less than 50%).  If Novolog meal coverage started, consider d/c of Amaryl while patient is in the hospital.  Please also order A1C to determine pre-hospitalization glycemic control and potential need for insulin at home. Text page sent to MD.   Thanks, Beryl MeagerJenny Porter Nakama, RN, BC-ADM Inpatient Diabetes Coordinator Pager [phone] (8a-5p)

## 2016-11-04 NOTE — Telephone Encounter (Signed)
Call received from Gae GallopAngela Cole, RN CM  [facility]  [facility]. No appointments available at this time.  Will schedule appointment if one becomes available prior to discharge.  Update provided to A. Richardson Doppole, RN CM

## 2016-11-05 LAB — GLUCOSE, CAPILLARY
GLUCOSE-CAPILLARY: 158 mg/dL — AB (ref 65–99)
Glucose-Capillary: 187 mg/dL — ABNORMAL HIGH (ref 65–99)

## 2016-11-05 MED ORDER — AMOXICILLIN-POT CLAVULANATE 875-125 MG PO TABS: 1.0000 | ORAL_TABLET | Freq: Two times a day (BID) | ORAL | 0 refills | Status: DC

## 2016-11-05 MED ORDER — SULFAMETHOXAZOLE-TRIMETHOPRIM 800-160 MG PO TABS: 1.0000 | ORAL_TABLET | Freq: Two times a day (BID) | ORAL | 0 refills | Status: AC

## 2016-11-05 NOTE — Care Management Note (Signed)
Case Management Note  Patient Details  Name: Carlus PavlovJose Garcia-Morales MRN: [id number] Date of Birth: 1961/05/10  Subjective/Objective:       Admitted with cellulitis of the R buttock.        PCP:  Laren Evertsamses [place] - [place]  Action/Plan: Discharge to home with home health services ( RN,SW) today.  Expected Discharge Date:  11/05/16               Expected Discharge Plan:  Home w Home Health Services  In-House Referral:     Discharge planning Services  CM Consult  Post Acute Care Choice:    Choice offered to:   [facility]  DME Arranged:    DME Agency:     HH Arranged:  RN, Social Work [employer] Agency:  [facility]  Status of Service:  Completed, signed off  If discussed at [employer] Meetings, dates discussed:    Additional Comments:  Epifanio LeschesCole, Avianna Moynahan Hudson, RN 11/05/2016, 1:46 PM

## 2016-11-05 NOTE — Progress Notes (Signed)
Boruch Garcia-Morales to be D/C'd Home per MD order.  Discussed with the patient and all questions fully answered.  VSS, Skin clean, dry and intact without evidence of skin break down, no evidence of skin tears noted. IV catheter discontinued intact. Site without signs and symptoms of complications. Dressing and pressure applied.  An After Visit Summary was printed and given to the patient. Patient received prescription.  D/c education completed with patient/family including follow up instructions, medication list, d/c activities limitations if indicated, with other d/c instructions as indicated by MD - patient able to verbalize understanding, all questions fully answered.  Interpreter Catalina PizzaGarciela used for discharge instructions.   Patient instructed to return to ED, call 911, or call MD for any changes in condition.   Patient escorted via WC, and D/C home via private auto.  Casper HarrisonSamantha K Mihira Tozzi 11/05/2016 12:19 PM

## 2016-11-05 NOTE — Progress Notes (Signed)
PROGRESS NOTE    John Meza  XLK:[id number] DOB: January 23, 1961 DOA: 11/02/2016 PCP: Danne HarborVEGA-CASASNOVAS,RAMESES, MD   Brief Narrative:   Patient admitted for cellulitis/abscess of his rectal area as he had failed outpatient therapy.   Assessment & Plan:   Active Problems:   HLD (hyperlipidemia)   Cellulitis of right buttock   Uncontrolled type 2 diabetes mellitus with hyperglycemia (HCC)   Pressure injury of skin  Cellulitis of Right Buttock Area -CT Pelvis done- no drainable abscess. Cont Augmentin and bactrim  -wound care consult appreciated. Supplies and wound care home connection setup by the Case manager. -Cultures- NGTD -I have advised him on how to avoid sitting in one position for long periods of time. He is going to have to switch position frequently.   Dm2 -hold metformin, cont other home regimen of Glimepiride.  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   HLD -on statin   DVT prophylaxis: SCDs Code Status: Full code Family Communication:  Wife at bedisde Disposition Plan: Discharge today with home wound care. Needs to follow up with his PCP early next week.   Consultants:   Wound care   Procedures:   none  Antimicrobials:   Augmentin and Bactrim    Subjective: Interpreter used - [name] Patient doesn't have any complaints. He is eager to go home today.   Objective: Vitals:   11/04/16 0439 11/04/16 1545 11/04/16 2100 11/05/16 0516  BP: 117/63 124/66 130/72 119/81  Pulse: 77 75 77 76  Resp: 20 18 18    Temp: 98.8 F (37.1 C) 98.6 F (37 C) 98.6 F (37 C) 98.5 F (36.9 C)  TempSrc: Oral  Oral Oral  SpO2: 98% 99% 99% 99%  Weight:      Height:        Intake/Output Summary (Last 24 hours) at 11/05/16 1017 Last data filed at 11/05/16 0654  Gross per 24 hour  Intake              240 ml  Output             2025 ml  Net            -1785 ml   Filed Weights   11/03/16 1923  Weight: 76.9 kg (169 lb 8  oz)    Examination:  General exam: Appears calm and comfortable  Respiratory system: Clear to auscultation. Respiratory effort normal. Cardiovascular system: S1 & S2 heard, RRR. No JVD, murmurs, rubs, gallops or clicks. No pedal edema. Gastrointestinal system: Abdomen is nondistended, soft and nontender. No organomegaly or masses felt. Normal bowel sounds heard. Central nervous system: Alert and oriented. No focal neurological deficits. Extremities: paraplegia Skin: Right buttock area has an erythematous area measuring around 10 cm in diameter with central denuded skin. Improving.  Psychiatry: Judgement and insight appear normal. Mood & affect appropriate.     Data Reviewed:   CBC:  Recent Labs Lab 11/02/16 1754 11/03/16 0721  WBC 10.5 7.9  NEUTROABS 7.6 5.1  HGB 13.0 11.6*  HCT 38.1* 34.0*  MCV 85.4 85.0  PLT 217 211   Basic Metabolic Panel:  Recent Labs Lab 11/02/16 1754 11/03/16 0721  NA 131* 133*  K 3.9 3.5  CL 96* 103  CO2 23 23  GLUCOSE 324* 226*  BUN 8 7  CREATININE 0.72 0.56*  CALCIUM 8.8* 8.2*   GFR: Estimated Creatinine Clearance: 102.3 mL/min (by C-G formula based on SCr of 0.56 mg/dL (L)). Liver Function Tests:  Recent Labs Lab  11/03/16 0721  AST 16  ALT 17  ALKPHOS 84  BILITOT 0.6  PROT 6.4*  ALBUMIN 2.5*   No results for input(s): LIPASE, AMYLASE in the last 168 hours. No results for input(s): AMMONIA in the last 168 hours. Coagulation Profile: No results for input(s): INR, PROTIME in the last 168 hours. Cardiac Enzymes: No results for input(s): CKTOTAL, CKMB, CKMBINDEX, TROPONINI in the last 168 hours. BNP (last 3 results) No results for input(s): PROBNP in the last 8760 hours. HbA1C: No results for input(s): HGBA1C in the last 72 hours. CBG:  Recent Labs Lab 11/04/16 1150 11/04/16 1716 11/04/16 2014 11/05/16 0003 11/05/16 0813  GLUCAP 353* 167* 241* 158* 187*   Lipid Profile: No results for input(s): CHOL, HDL, LDLCALC,  TRIG, CHOLHDL, LDLDIRECT in the last 72 hours. Thyroid Function Tests: No results for input(s): TSH, T4TOTAL, FREET4, T3FREE, THYROIDAB in the last 72 hours. Anemia Panel: No results for input(s): VITAMINB12, FOLATE, FERRITIN, TIBC, IRON, RETICCTPCT in the last 72 hours. Sepsis Labs:  Recent Labs Lab 11/02/16 1818 11/02/16 2202 11/03/16 0109  LATICACIDVEN 2.17* 2.10* 1.17    Recent Results (from the past 240 hour(s))  Culture, blood (routine x 2)     Status: None (Preliminary result)   Collection Time: 11/03/16 12:50 AM  Result Value Ref Range Status   Specimen Description BLOOD RIGHT FOREARM  Final   Special Requests BOTTLES DRAWN AEROBIC AND ANAEROBIC  Final   Culture NO GROWTH 1 DAY  Final   Report Status PENDING  Incomplete  Culture, blood (routine x 2)     Status: None (Preliminary result)   Collection Time: 11/03/16  1:03 AM  Result Value Ref Range Status   Specimen Description BLOOD LEFT ARM  Final   Special Requests BOTTLES DRAWN AEROBIC AND ANAEROBIC  Final   Culture NO GROWTH 1 DAY  Final   Report Status PENDING  Incomplete         Radiology Studies: No results found.      Scheduled Meds: . amoxicillin-clavulanate  1 tablet Oral Q12H  . insulin aspart  0-9 Units Subcutaneous TID WC  . insulin aspart  4 Units Subcutaneous TID WC  . insulin glargine  15 Units Subcutaneous Daily  . pantoprazole  40 mg Oral Daily  . pravastatin  10 mg Oral q1800  . sulfamethoxazole-trimethoprim  1 tablet Oral Q12H   Continuous Infusions:   LOS: 2 days    Time spent: 35 mins     Jennfer Gassen Joline Maxcy, MD [facility] Pager [phone]   If 7PM-7AM, please contact night-coverage [email or web address] Password [email or web address] 11/05/2016, 10:17 AM )

## 2016-11-05 NOTE — Discharge Summary (Signed)
Physician Discharge Summary  John Meza [id number] DOB: 08/24/1961 DOA: 11/02/2016  PCP: Danne HarborVEGA-CASASNOVAS,RAMESES, MD  Admit date: 11/02/2016 Discharge date: 11/05/2016  Admitted From: Home Disposition:  home  Recommendations for Outpatient Follow-up:  1. Follow up with PCP in 1 week  Home Health: wound care Equipment/Devices: none  Discharge Condition: stable CODE STATUS: full  Diet recommendation: return to PTA diet.   Brief/Interim Summary: John Meza is a 56 y.o. male with history of diabetes mellitus type 2, paraplegia, hyperlipidemia presents to the ER because of persistent erythema around the right buttock area which has been worsening over the last few days. Patient had come to the ER 3 days ago and was given IV antibiotics and prescribed oral antibiotics. Patient had not taken his oral antibiotics. Due to enlarging erythematous area on his right buttock area patient came to the ER. CT scan of the pelvic area does not show any definite abscess but shows features concerning for cellulitis. Patient is being admitted for IV antibiotics and to closely follow-up for any worsening. Initial lactate levels were elevated which improved with fluid bolus. Blood sugar was also elevated. Patient does not recall his last A1c.   During his hospital stay he ended up getting CT Pelvis which did show inflammatory changes and possible phlegmon in the right buttock area without any drainable fluid collection or abscess. Because he had failed outpatient treatment he was started on IV Abx and wound care team was consulted. Over the course of couple of days his site of infection improved, remained afebrile without any growth on his cultures and advised to rotate his body to keep constant pressure off the area.   Today he is deemed stable to be discharged on Augmentin and Bactrim as he has been doing well on it. He will need to follow up with his PCP within a week to ensure continual  improvement of the infected site. Meanwhile return to the ED if it becomes necessary. He is set up for home wound care connection by the case manager.   Discharge Diagnoses:  Active Problems:   HLD (hyperlipidemia)   Cellulitis of right buttock   Uncontrolled type 2 diabetes mellitus with hyperglycemia (HCC)   Pressure injury of skin  Cellulitis of Right Buttock Area -CT Pelvis done- no drainable abscess. Cont Augmentin and bactrim  -wound care consult appreciated. Supplies and wound care home connection setup by the Case manager. -Cultures- NGTD -I have advised him on how to avoid sitting in one position for long periods of time. He is going to have to switch position frequently.   Dm2 -resume home meds   Paraplegia -from work related spinal cord injury.   HLD -on statin  Discharge Instructions   Allergies as of 11/05/2016      Reactions   Other Itching, Rash, Other (See Comments)   ANTIBIOTIC? CAUSED RASH, ITCHING IN 2013-PER FAMILY Possibly niacin causing flushing reaction- as described by family?      Medication List    STOP taking these medications   cephALEXin 500 MG capsule Commonly known as:  KEFLEX     TAKE these medications   amoxicillin-clavulanate 875-125 MG tablet Commonly known as:  AUGMENTIN Take 1 tablet by mouth every 12 (twelve) hours.   glimepiride 2 MG tablet Commonly known as:  AMARYL Take 2 mg by mouth daily with breakfast.   HYDROcodone-acetaminophen 5-325 MG tablet Commonly known as:  NORCO/VICODIN Take 1-2 tablets by mouth every 4 (four) hours as needed for moderate pain or severe  pain.   lovastatin 10 MG tablet Commonly known as:  MEVACOR Take 10 mg by mouth at bedtime.   metFORMIN 850 MG tablet Commonly known as:  GLUCOPHAGE Take 850 mg by mouth 2 (two) times daily with a meal.   omeprazole 20 MG capsule Commonly known as:  PRILOSEC Take 20 mg by mouth daily.   ondansetron 4 MG tablet Commonly known as:  ZOFRAN Take 1  tablet (4 mg total) by mouth every 6 (six) hours. What changed:  when to take this  reasons to take this   sulfamethoxazole-trimethoprim 800-160 MG tablet Commonly known as:  BACTRIM DS,SEPTRA DS Take 1 tablet by mouth every 12 (twelve) hours. What changed:  when to take this      Follow-up Information    VEGA-CASASNOVAS,RAMESES, MD Follow up in 1 week(s).   Specialty:  Internal Medicine Contact information: [address] [place] [address] [phone]          Allergies  Allergen Reactions  . Other Itching, Rash and Other (See Comments)    ANTIBIOTIC? CAUSED RASH, ITCHING IN 2013-PER FAMILY Possibly niacin causing flushing reaction- as described by family?    Consultations:  Wound care    Procedures/Studies: Ct Pelvis W Contrast  Result Date: 11/03/2016 CLINICAL DATA:  56 year old male with cellulitis of the right buttock. Evaluate for abscess. EXAM: CT PELVIS WITH CONTRAST TECHNIQUE: Multidetector CT imaging of the pelvis was performed using the standard protocol following the bolus administration of intravenous contrast. CONTRAST:  ISOVUE-300 IOPAMIDOL (ISOVUE-300) INJECTION 61% COMPARISON:  CT dated 04/29/2015 FINDINGS: Urinary Tract: There is irregular and trabeculated appearance of the bladder wall, likely chronic changes related to chronic bladder outlet obstruction. Diffuse thickening of the posterior bladder wall appears similar to prior CT. No stone identified in the visualized distal ureters and bladder. Bowel: No bowel dilatation within the pelvis. No inflammatory changes of the visualized bowel. Normal appendix. Vascular/Lymphatic: No pathologically enlarged lymph nodes. No significant vascular abnormality seen. Reproductive: The prostate gland appears heterogeneous with areas of cystic changes similar to prior study. The seminal vesicles are grossly unremarkable. Other: Small fat containing left inguinal hernia. No inflammatory changes or fluid  collection. Musculoskeletal: There is degenerative changes of the spine. There is inflammatory changes of the subcutaneous soft tissue of the right gluteal region. An ill-defined 3.5 x 3.5 cm area of lower attenuation within this inflammatory area noted likely representing phlegmonous changes. No drainable fluid collection or abscess identified at time. There is extension of the inflammatory process to the soft tissues abutting the right ischial tuberosity. No bone erosion or definite evidence of osteomyelitis by CT. IMPRESSION: Inflammatory changes of the subcutaneous soft tissues of the right buttock with probable phlegmon. No drainable fluid collection/abscess identified at this time. Clinical correlation and follow-up recommended. There is abutment of right discussed tuberosity by the inflammatory tissue without definite evidence of osteomyelitis by CT. Trabeculated urinary bladder suggestive of chronic bladder outlet obstruction. Heterogeneous appearance of the prostate gland with areas of cystic changes similar to prior CT. Electronically Signed   By: Elgie Collard M.D.   On: 11/03/2016 01:44      Subjective:   Discharge Exam: Vitals:   11/04/16 2100 11/05/16 0516  BP: 130/72 119/81  Pulse: 77 76  Resp: 18   Temp: 98.6 F (37 C) 98.5 F (36.9 C)   Vitals:   11/04/16 0439 11/04/16 1545 11/04/16 2100 11/05/16 0516  BP: 117/63 124/66 130/72 119/81  Pulse: 77 75 77 76  Resp: 20 18 18    Temp: 98.8 F (37.1 C) 98.6 F (37 C) 98.6 F (37 C) 98.5 F (36.9 C)  TempSrc: Oral  Oral Oral  SpO2: 98% 99% 99% 99%  Weight:      Height:        General: Pt is alert, awake, not in acute distress Cardiovascular: RRR, S1/S2 +, no rubs, no gallops Respiratory: CTA bilaterally, no wheezing, no rhonchi Abdominal: Soft, NT, ND, bowel sounds + Extremities: no edema, no cyanosis Right buttock area has an erythematous area measuring around 10 cm in diameter with central denuded skin.  Improving    The results of significant diagnostics from this hospitalization (including imaging, microbiology, ancillary and laboratory) are listed below for reference.     Microbiology: Recent Results (from the past 240 hour(s))  Culture, blood (routine x 2)     Status: None (Preliminary result)   Collection Time: 11/03/16 12:50 AM  Result Value Ref Range Status   Specimen Description BLOOD RIGHT FOREARM  Final   Special Requests BOTTLES DRAWN AEROBIC AND ANAEROBIC  Final   Culture NO GROWTH 1 DAY  Final   Report Status PENDING  Incomplete  Culture, blood (routine x 2)     Status: None (Preliminary result)   Collection Time: 11/03/16  1:03 AM  Result Value Ref Range Status   Specimen Description BLOOD LEFT ARM  Final   Special Requests BOTTLES DRAWN AEROBIC AND ANAEROBIC  Final   Culture NO GROWTH 1 DAY  Final   Report Status PENDING  Incomplete     Labs: BNP (last 3 results) No results for input(s): BNP in the last 8760 hours. Basic Metabolic Panel:  Recent Labs Lab 11/02/16 1754 11/03/16 0721  NA 131* 133*  K 3.9 3.5  CL 96* 103  CO2 23 23  GLUCOSE 324* 226*  BUN 8 7  CREATININE 0.72 0.56*  CALCIUM 8.8* 8.2*   Liver Function Tests:  Recent Labs Lab 11/03/16 0721  AST 16  ALT 17  ALKPHOS 84  BILITOT 0.6  PROT 6.4*  ALBUMIN 2.5*   No results for input(s): LIPASE, AMYLASE in the last 168 hours. No results for input(s): AMMONIA in the last 168 hours. CBC:  Recent Labs Lab 11/02/16 1754 11/03/16 0721  WBC 10.5 7.9  NEUTROABS 7.6 5.1  HGB 13.0 11.6*  HCT 38.1* 34.0*  MCV 85.4 85.0  PLT 217 211   Cardiac Enzymes: No results for input(s): CKTOTAL, CKMB, CKMBINDEX, TROPONINI in the last 168 hours. BNP: Invalid input(s): POCBNP CBG:  Recent Labs Lab 11/04/16 1150 11/04/16 1716 11/04/16 2014 11/05/16 0003 11/05/16 0813  GLUCAP 353* 167* 241* 158* 187*   D-Dimer No results for input(s): DDIMER in the last 72 hours. Hgb A1c No  results for input(s): HGBA1C in the last 72 hours. Lipid Profile No results for input(s): CHOL, HDL, LDLCALC, TRIG, CHOLHDL, LDLDIRECT in the last 72 hours. Thyroid function studies No results for input(s): TSH, T4TOTAL, T3FREE, THYROIDAB in the last 72 hours.  Invalid input(s): FREET3 Anemia work up No results for input(s): VITAMINB12, FOLATE, FERRITIN, TIBC, IRON, RETICCTPCT in the last 72 hours. Urinalysis    Component Value Date/Time   COLORURINE AMBER (A) 12/30/2015 1724   APPEARANCEUR TURBID (A) 12/30/2015 1724   LABSPEC 1.023 12/30/2015 1724   PHURINE 5.5 12/30/2015 1724   GLUCOSEU 500 (A) 12/30/2015 1724   HGBUR LARGE (A) 12/30/2015 1724   BILIRUBINUR SMALL (A) 12/30/2015 1724   KETONESUR >80 (A) 12/30/2015 1724  PROTEINUR 100 (A) 12/30/2015 1724   UROBILINOGEN 1.0 04/30/2015 1120   NITRITE POSITIVE (A) 12/30/2015 1724   LEUKOCYTESUR LARGE (A) 12/30/2015 1724   Sepsis Labs Invalid input(s): PROCALCITONIN,  WBC,  LACTICIDVEN Microbiology Recent Results (from the past 240 hour(s))  Culture, blood (routine x 2)     Status: None (Preliminary result)   Collection Time: 11/03/16 12:50 AM  Result Value Ref Range Status   Specimen Description BLOOD RIGHT FOREARM  Final   Special Requests BOTTLES DRAWN AEROBIC AND ANAEROBIC  Final   Culture NO GROWTH 1 DAY  Final   Report Status PENDING  Incomplete  Culture, blood (routine x 2)     Status: None (Preliminary result)   Collection Time: 11/03/16  1:03 AM  Result Value Ref Range Status   Specimen Description BLOOD LEFT ARM  Final   Special Requests BOTTLES DRAWN AEROBIC AND ANAEROBIC  Final   Culture NO GROWTH 1 DAY  Final   Report Status PENDING  Incomplete     Time coordinating discharge: Over 30 minutes  SIGNED:   Dimple Nanas, MD  [facility] 11/05/2016, 10:22 AM Pager   If 7PM-7AM, please contact night-coverage [email or web address] Password [email or web address]

## 2016-11-05 NOTE — Progress Notes (Signed)
Interpreter Ashby DawesGraciela for [employer] RN

## 2016-11-06 ENCOUNTER — Telehealth: Payer: Self-pay

## 2016-11-06 LAB — HEMOGLOBIN A1C
Hgb A1c MFr Bld: 10.1 % — ABNORMAL HIGH (ref 4.8–5.6)
MEAN PLASMA GLUCOSE: 243 mg/dL

## 2016-11-06 NOTE — Telephone Encounter (Signed)
This Case Manager received referral from Gae GallopAngela Cole, RN CM that patient [facility]. Call placed to patient Multimedia programmer([employer] used for language translation, Interpreter # [phone]) to discuss. Patient indicated he already has a PCP-Dr. Juanell FairlyVega-Casanovas so he does not need to establish care at another practice.

## 2016-11-08 LAB — CULTURE, BLOOD (ROUTINE X 2)
Culture: NO GROWTH
Culture: NO GROWTH

## 2017-04-27 IMAGING — US US ABDOMEN COMPLETE
1 series · 14 of 25 positions shown · non-contrast
Comparison: CT chest, abdomen and pelvis April 29, 2015

CLINICAL DATA: Chest and abdominal pain for 2 days. History of
diabetes, hypertension.

EXAM:
ULTRASOUND ABDOMEN COMPLETE

[Series 1: us abdomen complete · 0.25mm/px · 14 of 86 slices shown]
[im 1/86]
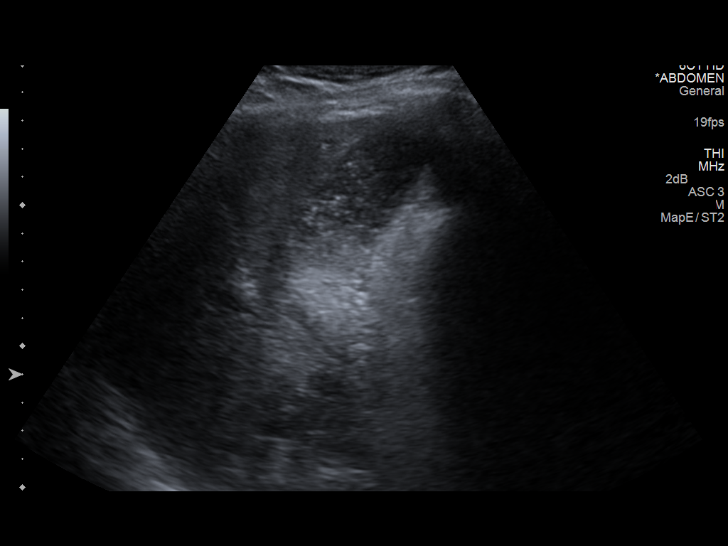
[im 8/86]
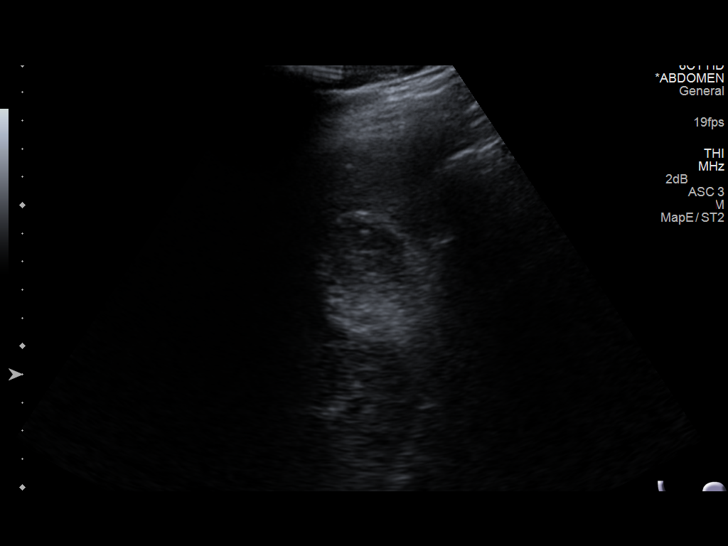
[im 15/86]
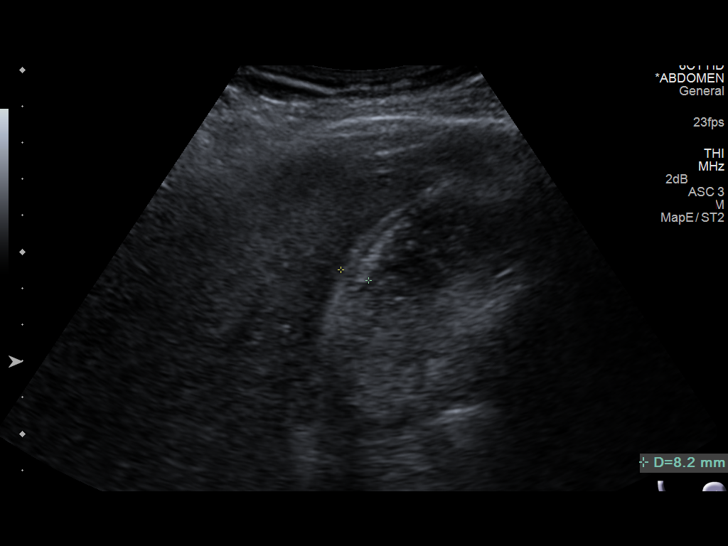
[im 22/86]
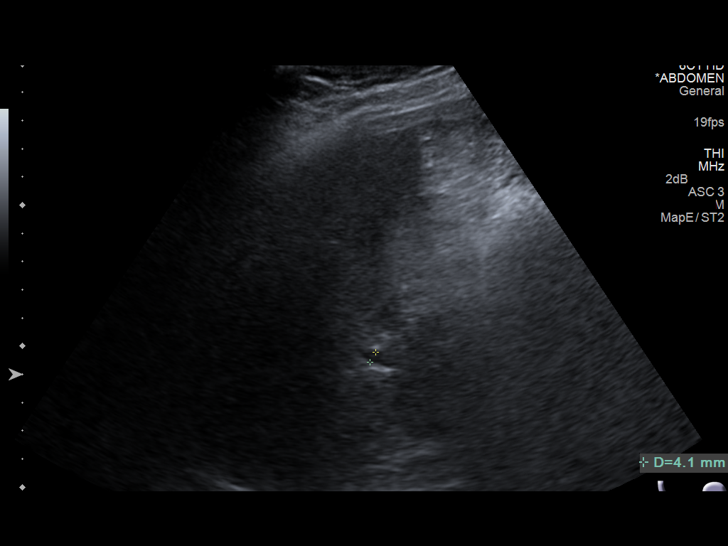
[im 29/86]
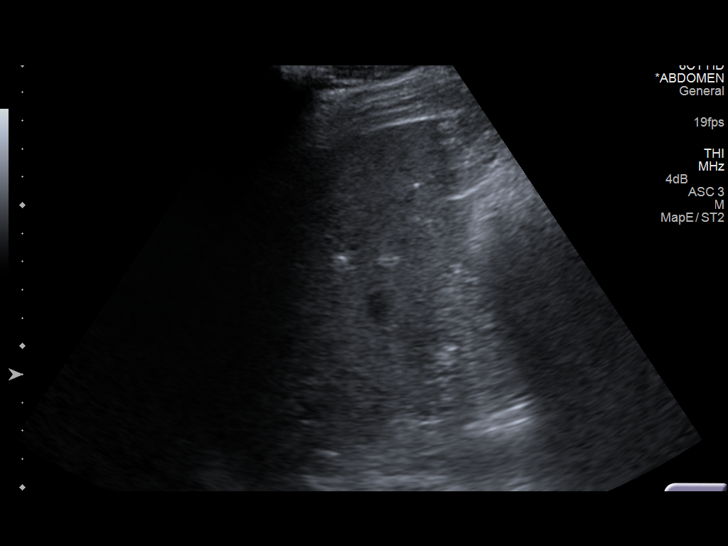
[im 32/86]
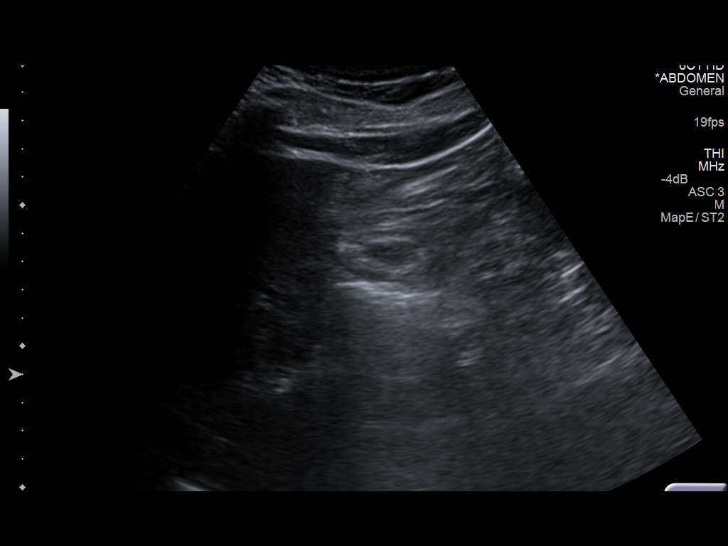
[im 39/86]
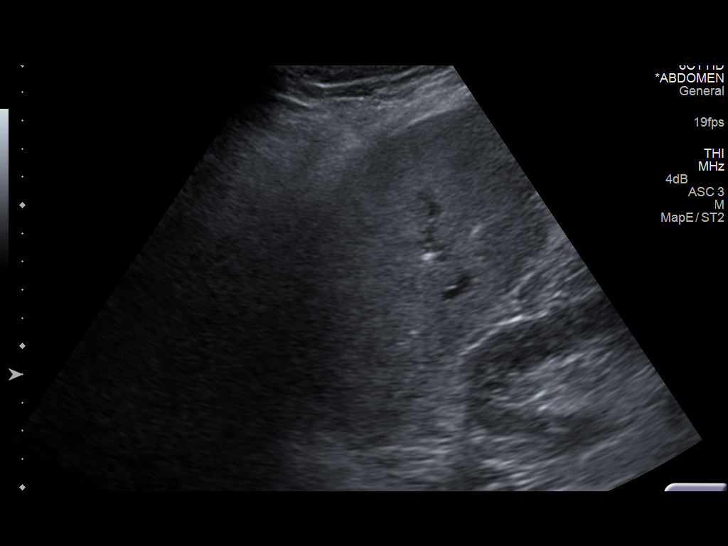
[im 47/86]
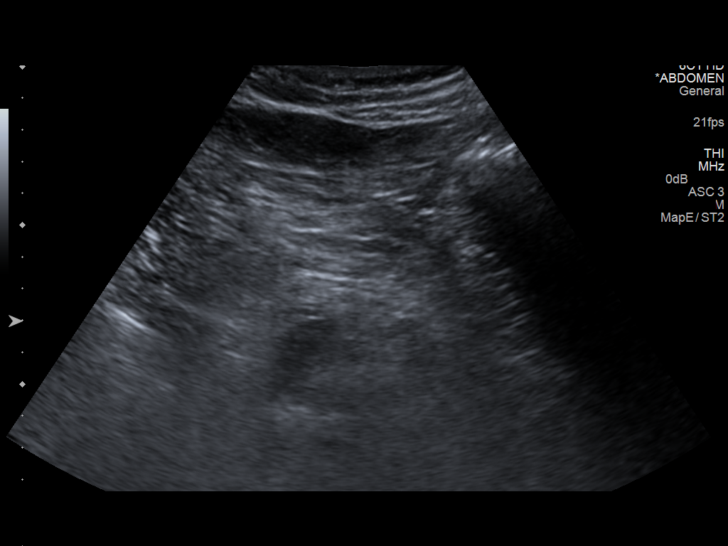
[im 54/86]
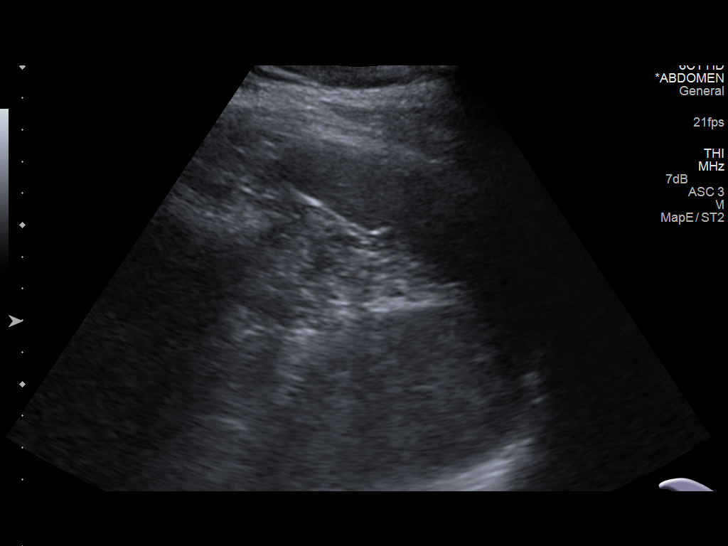
[im 57/86]
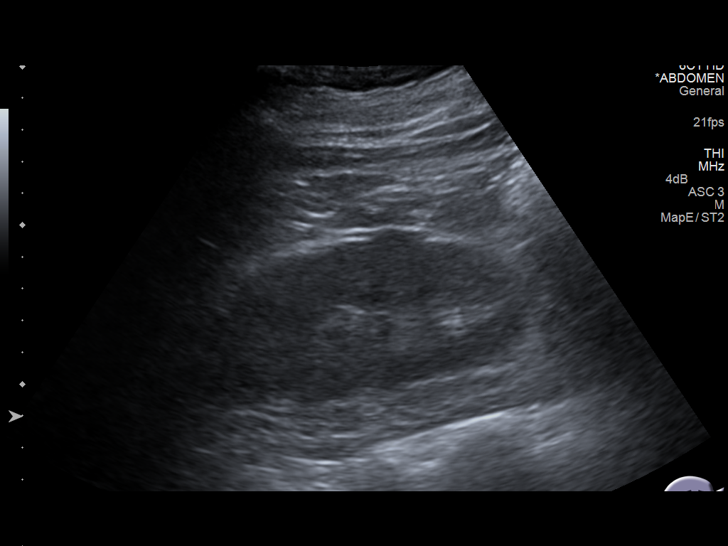
[im 64/86]
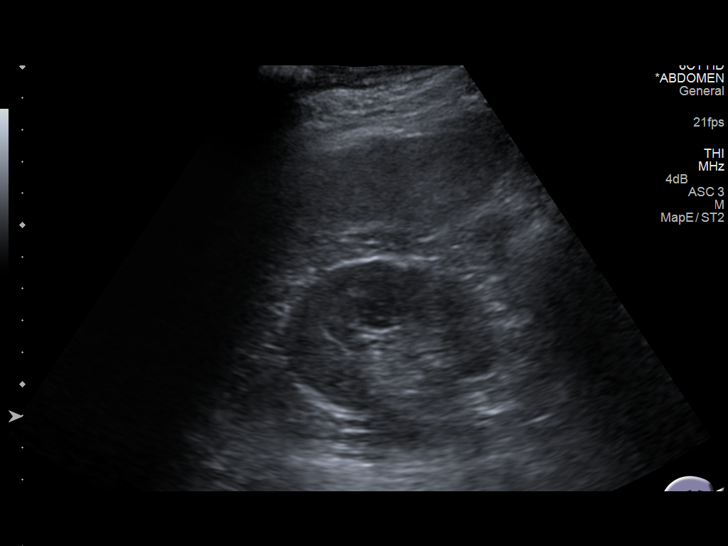
[im 71/86]
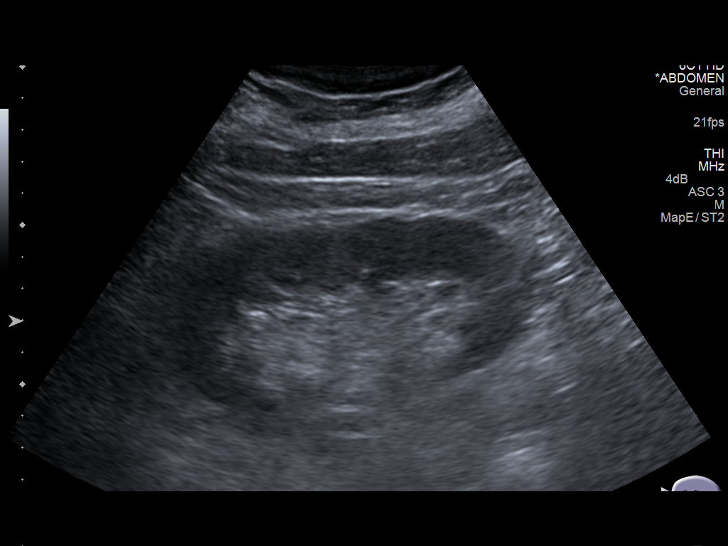
[im 78/86]
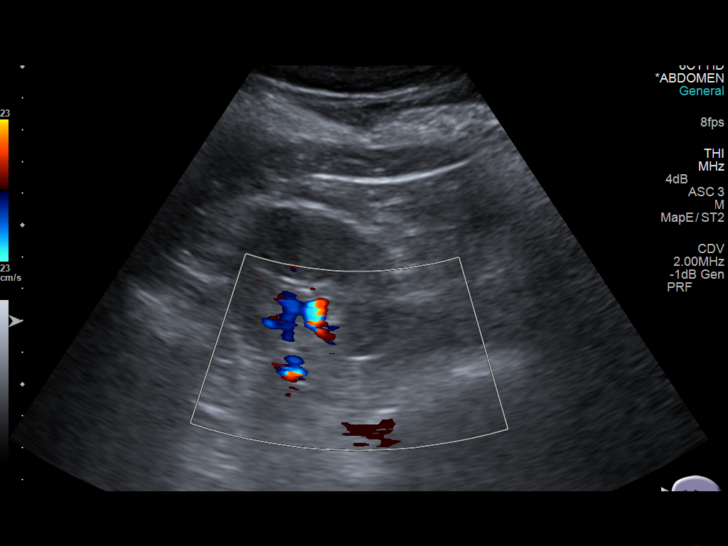
[im 86/86]
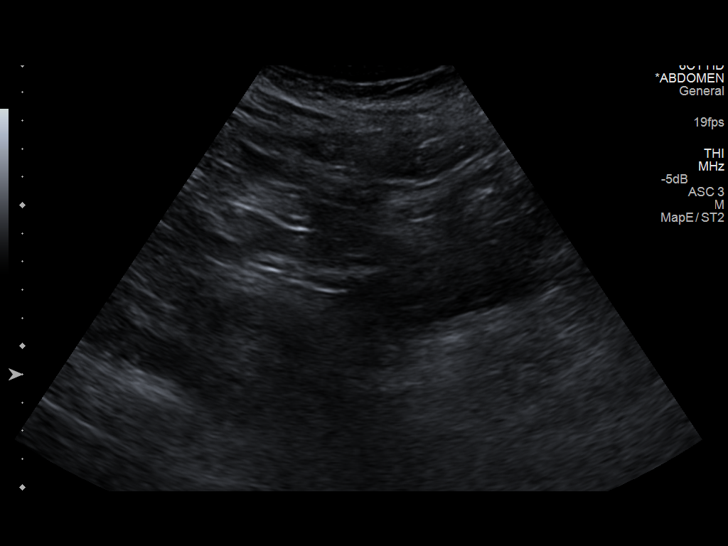

[14 of 25 positions shown; findings below may reference images not displayed]

FINDINGS: Gallbladder: Echogenic layering gallbladder sludge in addition to
punctate echogenic gallstones. Gallbladder wall thickening at 8 mm.
No sonographic Murphy's sign elicited.

Common bile duct: Diameter: 4 mm

Liver: Echogenic heterogeneous liver without intrahepatic biliary
dilatation. Hepatopetal portal vein. Hepatopetal portal vein.

IVC: No abnormality visualized.

Pancreas: Visualized portion unremarkable.

Spleen: Size and appearance within normal limits.

Right Kidney: Length: 12.1 cm. Echogenicity within normal limits. No
mass or hydronephrosis visualized.

Left Kidney: Length: 12 cm. Echogenicity within normal limits. No
mass or hydronephrosis visualized.

Abdominal aorta: No aneurysm visualized.

Other findings: None.
IMPRESSION: Sonographic findings of cholecystitis though, no sonographic
Murphy's sign elicited.

Heterogeneous echogenic liver can be seen with hepatocellular
disease/steatosis.

## 2017-12-17 ENCOUNTER — Emergency Department (HOSPITAL_COMMUNITY)
Admission: EM | Admit: 2017-12-17 | Discharge: 2017-12-17 | Disposition: A | Discharge status: Home / Self Care | Payer: Self-pay | Attending: Emergency Medicine | Admitting: Emergency Medicine

## 2017-12-17 ENCOUNTER — Encounter (HOSPITAL_COMMUNITY): Payer: Self-pay | Admitting: *Deleted

## 2017-12-17 ENCOUNTER — Other Ambulatory Visit: Payer: Self-pay

## 2017-12-17 DIAGNOSIS — Z87891 Personal history of nicotine dependence: Secondary | ICD-10-CM | POA: Insufficient documentation

## 2017-12-17 DIAGNOSIS — Z23 Encounter for immunization: Secondary | ICD-10-CM | POA: Insufficient documentation

## 2017-12-17 DIAGNOSIS — Z7984 Long term (current) use of oral hypoglycemic drugs: Secondary | ICD-10-CM | POA: Insufficient documentation

## 2017-12-17 DIAGNOSIS — E119 Type 2 diabetes mellitus without complications: Secondary | ICD-10-CM | POA: Insufficient documentation

## 2017-12-17 DIAGNOSIS — L89310 Pressure ulcer of right buttock, unstageable: Secondary | ICD-10-CM | POA: Insufficient documentation

## 2017-12-17 DIAGNOSIS — I10 Essential (primary) hypertension: Secondary | ICD-10-CM | POA: Insufficient documentation

## 2017-12-17 DIAGNOSIS — Z79899 Other long term (current) drug therapy: Secondary | ICD-10-CM | POA: Insufficient documentation

## 2017-12-17 DIAGNOSIS — G8220 Paraplegia, unspecified: Secondary | ICD-10-CM | POA: Insufficient documentation

## 2017-12-17 LAB — COMPREHENSIVE METABOLIC PANEL
ALK PHOS: 74 U/L (ref 38–126)
ALT: 13 U/L — AB (ref 17–63)
AST: 19 U/L (ref 15–41)
Albumin: 3.7 g/dL (ref 3.5–5.0)
Anion gap: 11 (ref 5–15)
BILIRUBIN TOTAL: 0.4 mg/dL (ref 0.3–1.2)
BUN: 5 mg/dL — ABNORMAL LOW (ref 6–20)
CALCIUM: 9.4 mg/dL (ref 8.9–10.3)
CO2: 22 mmol/L (ref 22–32)
CREATININE: 0.62 mg/dL (ref 0.61–1.24)
Chloride: 104 mmol/L (ref 101–111)
GFR calc non Af Amer: >60 mL/min (ref >60)
Glucose, Bld: 117 mg/dL — ABNORMAL HIGH (ref 65–99)
Potassium: 4.1 mmol/L (ref 3.5–5.1)
SODIUM: 137 mmol/L (ref 135–145)
TOTAL PROTEIN: 7.3 g/dL (ref 6.5–8.1)

## 2017-12-17 LAB — CBC
HCT: 41.6 % (ref 39.0–52.0)
Hemoglobin: 13.9 g/dL (ref 13.0–17.0)
MCH: 29.4 pg (ref 26.0–34.0)
MCHC: 33.4 g/dL (ref 30.0–36.0)
MCV: 88.1 fL (ref 78.0–100.0)
Platelets: 268 K/uL (ref 150–400)
RBC: 4.72 MIL/uL (ref 4.22–5.81)
RDW: 13.8 % (ref 11.5–15.5)
WBC: 6 K/uL (ref 4.0–10.5)

## 2017-12-17 MED ORDER — TETANUS-DIPHTH-ACELL PERTUSSIS 5-2.5-18.5 LF-MCG/0.5 IM SUSP
0.5000 mL | Freq: Once | INTRAMUSCULAR | Status: AC
  Administered 2017-12-17: 0.5 mL via INTRAMUSCULAR
  Filled 2017-12-17: qty 0.5

## 2017-12-17 MED ORDER — DOXYCYCLINE HYCLATE 100 MG PO CAPS: 100.0000 mg | ORAL_CAPSULE | Freq: Two times a day (BID) | ORAL | 0 refills | Status: DC

## 2017-12-17 NOTE — ED Triage Notes (Signed)
Pt in from home c/o R leg blister, pt seen by PCP and was given antibiotic (Amoxicillen 500mg )  that has been completed at this time, pt reports the blister is an estimated 1 inch long, denies fever & chills, pt reports white discharge from the leg, pt has DM, pt A&O x4

## 2017-12-17 NOTE — Discharge Instructions (Signed)
Please take antibiotic as prescribed.  Follow up with your doctor in 1 week for recheck. Return sooner if you notice no improvement or if you have other concerns.

## 2017-12-17 NOTE — ED Provider Notes (Signed)
[facility] Provider Note   CSN: [id number] Arrival date & time: 12/17/17  1252     History   Chief Complaint Chief Complaint  Patient presents with  . Leg Pain    R leg blister    HPI John Meza is a 57 y.o. male.  HPI   57 year old male with history of diabetes, recurrent cellulitis, paraplegia, skin ulcer brought here For evaluation of blister in the right buttock.  Patient is Hispanic speaking, history obtained through [place] interpreter.  2 weeks ago patient noticed a small pimple on his right buttock.  He has noticed that it has increased in size and occasionally draining out some pus.  He has been keeping it clean and put a dressing on it but has not resolved.  He denies any associated fever or chills, no nausea or vomiting, no significant pain because he is paraplegic and cannot feel that area.  Denies any injury.  No headache, shortness of breath, chest pain.    Past Medical History:  Diagnosis Date  . Cellulitis and abscess of buttock 10/2016  . Diabetes mellitus   . Hypertension   . Paraplegia (HCC) 2013   fell from ladder  . SCI (spinal cord injury)   . Spine fracture 11/16/2011   T 11- T9-L1    Patient Active Problem List   Diagnosis Date Noted  . Pressure injury of skin 11/04/2016  . Cellulitis of right buttock 11/03/2016  . Uncontrolled type 2 diabetes mellitus with hyperglycemia (HCC) 11/03/2016  . AP (abdominal pain)   . Sinus tachycardia   . Blood poisoning   . Diabetes type 2, uncontrolled (HCC)   . Hyponatremia   . Essential hypertension   . Choledocholithiasis   . Hypomagnesemia   . Hypokalemia   . Epigastric pain   . Acute cholecystitis   . Biliary tract imaging abnormality   . Obstructive jaundice 04/30/2015  . Sepsis (HCC) 04/30/2015  . Cholecystitis 04/30/2015  . Rotator cuff tear, left 08/12/2012  . Neurogenic bladder 01/06/2012  . Neurogenic bowel 01/06/2012  . SCI (spinal cord  injury) 11/20/2011  . 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 Concussion with brief LOC 11/16/2011  . Accidental fall from ladder 11/16/2011  . DM, UNCOMPLICATED, TYPE II, UNCONTROLLED 05/26/2007  . OBESITY, MODERATE 05/26/2007  . HLD (hyperlipidemia) 05/25/2007  . DYSPEPSIA 05/25/2007    Past Surgical History:  Procedure Laterality Date  . CHOLECYSTECTOMY N/A 05/01/2015   Procedure: LAPAROSCOPIC CHOLECYSTECTOMY WITH ATTEMPTED INTRAOPERATIVE CHOLANGIOGRAM;  Surgeon: Violeta GelinasBurke Thompson, MD;  Location: [facility] OR;  Service: General;  Laterality: N/A;  . SPINE SURGERY         Home Medications    Prior to Admission medications   Medication Sig Start Date End Date Taking? Authorizing Provider  amoxicillin-clavulanate (AUGMENTIN) 875-125 MG tablet Take 1 tablet by mouth every 12 (twelve) hours. 11/05/16   Amin, Ankit Chirag, MD  glimepiride (AMARYL) 2 MG tablet Take 2 mg by mouth daily with breakfast.    [provider]  HYDROcodone-acetaminophen (NORCO/VICODIN) 5-325 MG per tablet Take 1-2 tablets by mouth every 4 (four) hours as needed for moderate pain or severe pain. 05/06/15   Drema DallasWoods, Curtis J, MD  lovastatin (MEVACOR) 10 MG tablet Take 10 mg by mouth at bedtime.    [provider]  metFORMIN (GLUCOPHAGE) 850 MG tablet Take 850 mg by mouth 2 (two) times daily with a meal.     [provider]  omeprazole (PRILOSEC) 20 MG capsule Take  20 mg by mouth daily.    [provider]  ondansetron (ZOFRAN) 4 MG tablet Take 1 tablet (4 mg total) by mouth every 6 (six) hours. Patient taking differently: Take 4 mg by mouth every 8 (eight) hours as needed for nausea.  12/30/15   Tilden Fossa, MD    Family History Family History  Problem Relation Age of Onset  . Diabetes Father   . Diabetes Sister     Social History Social History   Tobacco Use  . Smoking status: Former Smoker    Packs/day: 0.10    Years: 5.00    Pack years: 0.50    Last attempt to quit: 10/13/2011     Years since quitting: 6.1  . Smokeless tobacco: Never Used  Substance Use Topics  . Alcohol use: Yes    Alcohol/week: 0.6 oz    Types: 1 Cans of beer per week  . Drug use: No     Allergies   Other   Review of Systems Review of Systems  Constitutional: Negative for fever.  Skin: Positive for wound.  All other systems reviewed and are negative.    Physical Exam Updated Vital Signs BP 107/70   Pulse 82   Temp 98.1 F (36.7 C) (Oral)   Resp 14   Ht 5\' 5"  (1.651 m)   Wt 76.7 kg (169 lb)   SpO2 100%   BMI 28.12 kg/m   Physical Exam  Constitutional: He appears well-developed and well-nourished. No distress.  HENT:  Head: Atraumatic.  Eyes: Conjunctivae are normal.  Neck: Neck supple.  Neurological: He is alert.  Skin: No rash noted.  Right buttock: There is a dime sized shallow ulceration noted to the lateral aspect of the buttock not overlying a bony prominence.  No surrounding skin erythema.  No fluctuant noted.  No tenderness to palpation.  Psychiatric: He has a normal mood and affect.  Nursing note and vitals reviewed.    ED Treatments / Results  Labs (all labs ordered are listed, but only abnormal results are displayed) Labs Reviewed  COMPREHENSIVE METABOLIC PANEL - Abnormal; Notable for the following components:      Result Value   Glucose, Bld 117 (*)    BUN 5 (*)    ALT 13 (*)    All other components within normal limits  CBC    EKG  EKG Interpretation None       Radiology No results found.  Procedures Procedures (including critical care time)  Medications Ordered in ED Medications - No data to display   Initial Impression / Assessment and Plan / ED Course  I have reviewed the triage vital signs and the nursing notes.  Pertinent labs & imaging results that were available during my care of the patient were reviewed by me and considered in my medical decision making (see chart for details).     BP 107/70   Pulse 82   Temp 98.1 F  (36.7 C) (Oral)   Resp 14   Ht 5\' 5"  (1.651 m)   Wt 76.7 kg (169 lb)   SpO2 100%   BMI 28.12 kg/m    Final Clinical Impressions(s) / ED Diagnoses   Final diagnoses:  Pressure ulcer, buttock, right, unstageable [facility])    ED Discharge Orders        Ordered    doxycycline (VIBRAMYCIN) 100 MG capsule  2 times daily     12/17/17 1722     5:17 PM This patient is paraplegic, history of  diabetes, who developed an ulcer to his.  This is not in a location typical of right buttock pressure ulcer.  On exam, no induration or fluctuant to suggest an abscess at this time.  Given his medical history, he will be treated with doxycycline for anticipated infection.  We will also update his Tdap.  Wound care provided and recommended.  Return in 48 hours if no improvement.  Patient will also follow-up with PCP for further care.  He has no systemic manifestation.   Fayrene Helper, PA-C 12/17/17 1749    Wynetta Fines, MD 12/18/17 [phone]

## 2017-12-17 NOTE — ED Notes (Signed)
EDP at bedside  

## 2017-12-17 NOTE — ED Notes (Signed)
Patient given discharge instructions and verbalized understanding.  Patient stable to discharge at this time.  Patient is alert and oriented to baseline.  No distressed noted at this time.  All belongings taken with the patient at discharge.   

## 2018-05-18 ENCOUNTER — Ambulatory Visit: Payer: Self-pay | Attending: Family Medicine

## 2018-06-01 ENCOUNTER — Ambulatory Visit: Payer: Self-pay | Admitting: Family Medicine

## 2018-07-01 ENCOUNTER — Encounter: Payer: Self-pay | Admitting: Family Medicine

## 2018-07-01 ENCOUNTER — Ambulatory Visit: Payer: Self-pay | Attending: Family Medicine | Admitting: Family Medicine

## 2018-07-01 ENCOUNTER — Telehealth: Payer: Self-pay | Admitting: Family Medicine

## 2018-07-01 VITALS — BP 153/84 | HR 70 | Temp 97.6°F

## 2018-07-01 DIAGNOSIS — N319 Neuromuscular dysfunction of bladder, unspecified: Secondary | ICD-10-CM

## 2018-07-01 DIAGNOSIS — Z87891 Personal history of nicotine dependence: Secondary | ICD-10-CM | POA: Insufficient documentation

## 2018-07-01 DIAGNOSIS — E1165 Type 2 diabetes mellitus with hyperglycemia: Secondary | ICD-10-CM | POA: Insufficient documentation

## 2018-07-01 DIAGNOSIS — K5900 Constipation, unspecified: Secondary | ICD-10-CM | POA: Insufficient documentation

## 2018-07-01 DIAGNOSIS — R159 Full incontinence of feces: Secondary | ICD-10-CM

## 2018-07-01 DIAGNOSIS — S22089A Unspecified fracture of T11-T12 vertebra, initial encounter for closed fracture: Secondary | ICD-10-CM | POA: Insufficient documentation

## 2018-07-01 DIAGNOSIS — S24104A Unspecified injury at T11-T12 level of thoracic spinal cord, initial encounter: Secondary | ICD-10-CM | POA: Insufficient documentation

## 2018-07-01 DIAGNOSIS — E11649 Type 2 diabetes mellitus with hypoglycemia without coma: Secondary | ICD-10-CM

## 2018-07-01 DIAGNOSIS — Z794 Long term (current) use of insulin: Secondary | ICD-10-CM | POA: Insufficient documentation

## 2018-07-01 DIAGNOSIS — R32 Unspecified urinary incontinence: Secondary | ICD-10-CM

## 2018-07-01 DIAGNOSIS — I10 Essential (primary) hypertension: Secondary | ICD-10-CM

## 2018-07-01 DIAGNOSIS — Z79899 Other long term (current) drug therapy: Secondary | ICD-10-CM

## 2018-07-01 DIAGNOSIS — Z23 Encounter for immunization: Secondary | ICD-10-CM

## 2018-07-01 DIAGNOSIS — Z87440 Personal history of urinary (tract) infections: Secondary | ICD-10-CM | POA: Insufficient documentation

## 2018-07-01 DIAGNOSIS — G8220 Paraplegia, unspecified: Secondary | ICD-10-CM

## 2018-07-01 LAB — POCT GLYCOSYLATED HEMOGLOBIN (HGB A1C): Hemoglobin A1C: 10.8 % — AB (ref 4.0–5.6)

## 2018-07-01 LAB — GLUCOSE, POCT (MANUAL RESULT ENTRY): POC Glucose: 125 mg/dL — AB (ref 70–99)

## 2018-07-01 MED ORDER — OMEPRAZOLE 20 MG PO CPDR: 20.0000 mg | DELAYED_RELEASE_CAPSULE | Freq: Every day | ORAL | 11 refills | Status: DC

## 2018-07-01 MED ORDER — GLIMEPIRIDE 4 MG PO TABS: 4.0000 mg | ORAL_TABLET | Freq: Every day | ORAL | 6 refills | Status: DC

## 2018-07-01 MED ORDER — METFORMIN HCL 1000 MG PO TABS: 1000.0000 mg | ORAL_TABLET | Freq: Two times a day (BID) | ORAL | 6 refills | Status: DC

## 2018-07-01 MED ORDER — LISINOPRIL 20 MG PO TABS: 20.0000 mg | ORAL_TABLET | Freq: Every day | ORAL | 6 refills | Status: DC

## 2018-07-01 MED ORDER — SIMVASTATIN 20 MG PO TABS: 20.0000 mg | ORAL_TABLET | Freq: Every day | ORAL | 6 refills | Status: DC

## 2018-07-01 NOTE — Progress Notes (Signed)
Subjective:    Patient ID: John Meza, male    DOB: 1961/08/04, 57 y.o.   MRN: [id number]   Due to a language barrier, video interpreter services were used at today's visit  HPI 57 year old male with past history of diabetes, recurrent cellulitis, paraplegia due to a T11 fracture with spinal cord injury after falling from a relief at work approximately 7 years ago, who is new to the practice.  Patient was seen and evaluated in the emergency department on 12/17/2017 and diagnosed with a pressure ulcer on the right buttock for which he was prescribed doxycycline.  Patient denies any issues with skin breakdown at today's visit.  Patient with hemoglobin A1c done at today's visit which is elevated at 10.8.  Patient's random blood sugar at today's visit is 125.  Patient states that he checks his home blood sugars infrequently.  Patient does believe that his blood sugars are often high.  Patient states that when he was initially diagnosed with diabetes he did receive insulin in the hospital but has not been on insulin since that time.  Patient denies any increased thirst.  Patient is not sure about urinary frequency as he has had loss of bowel and bladder sensation status post spinal injury.        Patient reports that he has had issues with recurrent urinary tract infections.  Patient states that when he gets a urinary tract infection, he tends to get a headache, chills and feel sweaty.  Patient was most recently on Augmentin per medical records.  Patient with current antibiotic use.  Patient has history of an allergy to a medication used to treat bladder infections and cannot recall what the medication was.  Patient states that the medication caused outbreak of hives.  Patient's wife also cannot recall the name of the medication.  Patient states that he feels well at today's visit.  Patient reports no chest pain or palpitations, no abdominal pain.  No diarrhea.  Patient does tend to have issues with  constipation and took over-the-counter medicine as needed.  Patient has seen no blood in the stool or dark stools.  Patient has a bowel movement at least every 3 days.  Patient is on lisinopril for blood.  Patient is on oral medications, metformin and glimepiride for his diabetes.  Patient reports family history of mother with diabetes.  Patient is currently considered disabled.  Patient is not working.  Patient has been married for the past 35 years.  Patient does continue to smoke cigarettes, about 3/day.  Patient denies current alcohol use.  Past Medical History:  Diagnosis Date  . Cellulitis and abscess of buttock 10/2016  . Diabetes mellitus   . Hypertension   . Paraplegia (HCC) 2013   fell from ladder  . SCI (spinal cord injury)   . Spine fracture 11/16/2011   T 11- T9-L1   Past Surgical History:  Procedure Laterality Date  . CHOLECYSTECTOMY N/A 05/01/2015   Procedure: LAPAROSCOPIC CHOLECYSTECTOMY WITH ATTEMPTED INTRAOPERATIVE CHOLANGIOGRAM;  Surgeon: Violeta Gelinas, MD;  Location: [facility] OR;  Service: General;  Laterality: N/A;  . SPINE SURGERY     Family History  Problem Relation Age of Onset  . Diabetes Father   . Diabetes Sister    Social History   Tobacco Use  . Smoking status: Former Smoker    Packs/day: 0.10    Years: 5.00    Pack years: 0.50    Last attempt to quit: 10/13/2011    Years since quitting: 6.7  .  Smokeless tobacco: Never Used  Substance Use Topics  . Alcohol use: Yes    Alcohol/week: 1.0 standard drinks    Types: 1 Cans of beer per week  . Drug use: No   Allergies  Allergen Reactions  . Other Itching, Rash and Other (See Comments)    ANTIBIOTIC? CAUSED RASH, ITCHING IN 2013-PER FAMILY Possibly niacin causing flushing reaction- as described by family?    Review of Systems  Constitutional: Negative for chills, fatigue and fever.  HENT: Negative for congestion and trouble swallowing.   Respiratory: Negative for cough and shortness of breath.     Cardiovascular: Negative for chest pain, palpitations and leg swelling.  Gastrointestinal: Positive for constipation. Negative for abdominal pain.  Genitourinary: Positive for difficulty urinating. Negative for flank pain.       Patient with bowel and bladder incontinence status post spinal cord injury  Musculoskeletal: Negative for arthralgias and joint swelling.  Neurological: Positive for weakness and numbness. Negative for dizziness and headaches.  Psychiatric/Behavioral: Negative for suicidal ideas. The patient is nervous/anxious.        Patient denies any depression or anxiety related to his injury       Objective:   Physical Exam BP (!) 153/84   Pulse 70   Temp 97.6 F (36.4 C) (Oral)   SpO2 100%   Vital signs and nurse's notes reviewed. General-well-nourished, well-developed male in no acute distress, patient is sitting in a wheelchair.  Patient does have seat cushion.  Patient was initially accompanied by his wife but she had to leave the room to attend her own medical appointment today. Neck-supple, no lymphadenopathy, no thyromegaly, no carotid bruit. Lungs-clear to auscultation bilaterally Cardiovascular-regular rate and rhythm Abdomen-soft, nontender; patient is wearing incontinence adult diaper Back-no CVA tenderness Neuro- patient with 0/5 muscle strength and loss of sensation below the waist. Diabetic foot exam- abnormal monofilament/absent sensation secondary to paralysis.  Patient mild atrophy of the feet.  Patient with good nail-no thickening or discoloration of the nails and nails are in the proper length.  Patient with 1+ palpable dorsalis pedis and posterior tibialis.  No active skin breakdown and no calluses on the feet.     Assessment & Plan:  1. Uncontrolled type 2 diabetes mellitus with hyperglycemia Patient's hemoglobin A1c is elevated at 10.8 at today's visit and on review of chart, patient's last A1c was also greater than 10.  Patient's Amaryl will be  increased to 4 mg once daily in the morning prior to breakfast.  Patient reports that he does not check his blood sugars frequently but patient is encouraged and keep a record of his blood sugars and notify the office if they are staying above 180 fasting.  Patient will continue a low carbohydrate diet.  Patient will have CMP, TSH due to diabetes as well as complaint patient and patient will have lipid panel at today's visit.  Patient's urine microalbumin was changed to a future order as patient unable to give urine sample without being cathed and RN was not available at today's visit to perform this action. - POCT glucose (manual entry) - POCT glycosylated hemoglobin (Hb A1C) - glimepiride (AMARYL) 4 MG tablet; Take 1 tablet (4 mg total) by mouth daily with breakfast.  Dispense: 30 tablet; Refill: 6 - lisinopril (PRINIVIL,ZESTRIL) 20 MG tablet; Take 1 tablet (20 mg total) by mouth daily.  Dispense: 30 tablet; Refill: 6 - metFORMIN (GLUCOPHAGE) 1000 MG tablet; Take 1 tablet (1,000 mg total) by mouth 2 (two) times daily with  a meal.  Dispense: 60 tablet; Refill: 6 - Lipid panel - TSH - Comprehensive metabolic panel - Microalbumin/Creatinine Ratio, Urine; Future - simvastatin (ZOCOR) 20 MG tablet; Take 1 tablet (20 mg total) by mouth at bedtime. To lower cholesterol  Dispense: 30 tablet; Refill: 6  2. Essential hypertension Patient with hypertension and patient will continue with daily.  Medication may be added if blood pressure still high/greater than 130/80 at upcoming recheck/lab visit. - lisinopril (PRINIVIL,ZESTRIL) 20 MG tablet; Take 1 tablet (20 mg total) by mouth daily.  Dispense: 30 tablet; Refill: 6  3. Neurogenic bladder With history of neurogenic bladder as well as fecal incontinence status post spinal injury and patient unfortunately with issues with recurrent urinary tract infection.  Order was placed for urinalysis but this had to be canceled as patient could not example with being  cathed and that was not able to look at today's visit to perform this procedure.  Patient will bring back sample for urinalysis next week.  4. Paraplegia following spinal cord injury (HCC) Review of medical records, patient with history of T11 fracture status post fall resulting in paraplegia along with neurogenic bowel and bladder.  Patient and wife report no issues with skin breakdown at today's visit.  5. Encounter for long-term (current) use of medications Patient will have CMP done in follow-up of long-term use of medications  6. Hypomagnesemia On review of chart, patient has had issues with low magnesium and magnesium level will be obtained at today's visit - Magnesium  7. Need for immunization against influenza Patient was offered and agreed to have influenza immunization done at today's visit and patient was given a handout regarding his immunization. - Flu Vaccine QUAD 36+ mos IM  An After Visit Summary was printed and given to the patient. Allergies as of 07/01/2018      Reactions   Other Itching, Rash, Other (See Comments)   ANTIBIOTIC? CAUSED RASH, ITCHING IN 2013-PER FAMILY Possibly niacin causing flushing reaction- as described by family?      Medication List        Accurate as of 07/01/18  1:08 PM. Always use your most recent med list.          amoxicillin-clavulanate 875-125 MG tablet Commonly known as:  AUGMENTIN Take 1 tablet by mouth every 12 (twelve) hours.   doxycycline 100 MG capsule Commonly known as:  VIBRAMYCIN Take 1 capsule (100 mg total) by mouth 2 (two) times daily. One po bid x 7 days   glimepiride 4 MG tablet Commonly known as:  AMARYL Take 1 tablet (4 mg total) by mouth daily with breakfast.   lisinopril 20 MG tablet Commonly known as:  PRINIVIL,ZESTRIL Take 1 tablet (20 mg total) by mouth daily.   lovastatin 10 MG tablet Commonly known as:  MEVACOR Take 10 mg by mouth at bedtime.   metFORMIN 1000 MG tablet Commonly known as:   GLUCOPHAGE Take 1 tablet (1,000 mg total) by mouth 2 (two) times daily with a meal.   omeprazole 20 MG capsule Commonly known as:  PRILOSEC Take 1 capsule (20 mg total) by mouth daily. To reduce stomach acid   ondansetron 4 MG tablet Commonly known as:  ZOFRAN Take 1 tablet (4 mg total) by mouth every 6 (six) hours.   simvastatin 20 MG tablet Commonly known as:  ZOCOR Take 1 tablet (20 mg total) by mouth at bedtime. To lower cholesterol     Return in about 4 months (around 10/31/2018) for dm.

## 2018-07-02 LAB — LIPID PANEL
Chol/HDL Ratio: 3.8 ratio (ref 0.0–5.0)
Cholesterol, Total: 145 mg/dL (ref 100–199)
HDL: 38 mg/dL — ABNORMAL LOW
LDL Calculated: 73 mg/dL (ref 0–99)
Triglycerides: 171 mg/dL — ABNORMAL HIGH (ref 0–149)
VLDL Cholesterol Cal: 34 mg/dL (ref 5–40)

## 2018-07-02 LAB — COMPREHENSIVE METABOLIC PANEL WITH GFR
ALT: 14 IU/L (ref 0–44)
AST: 15 IU/L (ref 0–40)
Albumin/Globulin Ratio: 1.4 (ref 1.2–2.2)
Albumin: 4.3 g/dL (ref 3.5–5.5)
Alkaline Phosphatase: 95 IU/L (ref 39–117)
BUN/Creatinine Ratio: 20 (ref 9–20)
BUN: 23 mg/dL (ref 6–24)
Bilirubin Total: 0.3 mg/dL (ref 0.0–1.2)
CO2: 20 mmol/L (ref 20–29)
Calcium: 9.5 mg/dL (ref 8.7–10.2)
Chloride: 95 mmol/L — ABNORMAL LOW (ref 96–106)
Creatinine, Ser: 1.17 mg/dL (ref 0.76–1.27)
GFR calc Af Amer: 81 mL/min/1.73
GFR calc non Af Amer: 70 mL/min/1.73
Globulin, Total: 3 g/dL (ref 1.5–4.5)
Glucose: 137 mg/dL — ABNORMAL HIGH (ref 65–99)
Potassium: 4.6 mmol/L (ref 3.5–5.2)
Sodium: 133 mmol/L — ABNORMAL LOW (ref 134–144)
Total Protein: 7.3 g/dL (ref 6.0–8.5)

## 2018-07-02 LAB — TSH: TSH: 3.98 u[IU]/mL (ref 0.450–4.500)

## 2018-07-02 LAB — MAGNESIUM: Magnesium: 1.7 mg/dL (ref 1.6–2.3)

## 2018-07-04 MED FILL — SIMVASTATIN 20 MG TABLET: 20 | 30 days supply | Qty: 30 | Fill #0

## 2018-07-04 MED FILL — metFORMIN HCL 1000 MG TABS: 1000 | 30 days supply | Qty: 60 | Fill #0

## 2018-07-04 MED FILL — GLIMEPIRIDE 4 MG TABS: 4 | 30 days supply | Qty: 30 | Fill #0

## 2018-07-04 MED FILL — LISINOPRIL 20 MG TAB: 20 | 30 days supply | Qty: 30 | Fill #0

## 2018-07-04 MED FILL — OMEPRAZOLE 20 MG CAP: 20 | 30 days supply | Qty: 30 | Fill #0

## 2018-07-04 NOTE — Addendum Note (Signed)
Addended by: Paschal DoppWHITE, VANESSA J on: 07/04/2018 11:27 AM   Modules accepted: Orders

## 2018-07-05 ENCOUNTER — Telehealth: Payer: Self-pay

## 2018-07-05 LAB — MICROALBUMIN / CREATININE URINE RATIO
Creatinine, Urine: 28.7 mg/dL
Microalb/Creat Ratio: 154.4 mg/g{creat} — ABNORMAL HIGH (ref 0.0–30.0)
Microalbumin, Urine: 44.3 ug/mL

## 2018-07-05 NOTE — Telephone Encounter (Signed)
[employer] [name]  Id#  [id number] contacted pt to go over lab results pt is aware of results and doesn't have any questions or concerns

## 2018-10-31 ENCOUNTER — Ambulatory Visit: Payer: Self-pay | Attending: Family Medicine | Admitting: Family Medicine

## 2018-10-31 ENCOUNTER — Encounter: Payer: Self-pay | Admitting: Family Medicine

## 2018-10-31 VITALS — BP 110/73 | HR 68 | Temp 98.7°F | Resp 18 | Ht 66.0 in

## 2018-10-31 DIAGNOSIS — Z7984 Long term (current) use of oral hypoglycemic drugs: Secondary | ICD-10-CM | POA: Insufficient documentation

## 2018-10-31 DIAGNOSIS — Z87891 Personal history of nicotine dependence: Secondary | ICD-10-CM | POA: Insufficient documentation

## 2018-10-31 DIAGNOSIS — Z79899 Other long term (current) drug therapy: Secondary | ICD-10-CM

## 2018-10-31 DIAGNOSIS — E1165 Type 2 diabetes mellitus with hyperglycemia: Secondary | ICD-10-CM | POA: Insufficient documentation

## 2018-10-31 DIAGNOSIS — E785 Hyperlipidemia, unspecified: Secondary | ICD-10-CM

## 2018-10-31 DIAGNOSIS — I10 Essential (primary) hypertension: Secondary | ICD-10-CM

## 2018-10-31 DIAGNOSIS — G8220 Paraplegia, unspecified: Secondary | ICD-10-CM

## 2018-10-31 DIAGNOSIS — M545 Low back pain: Secondary | ICD-10-CM | POA: Insufficient documentation

## 2018-10-31 DIAGNOSIS — Z7982 Long term (current) use of aspirin: Secondary | ICD-10-CM | POA: Insufficient documentation

## 2018-10-31 DIAGNOSIS — E11649 Type 2 diabetes mellitus with hypoglycemia without coma: Secondary | ICD-10-CM

## 2018-10-31 DIAGNOSIS — W132XXD Fall from, out of or through roof, subsequent encounter: Secondary | ICD-10-CM | POA: Insufficient documentation

## 2018-10-31 DIAGNOSIS — Z993 Dependence on wheelchair: Secondary | ICD-10-CM | POA: Insufficient documentation

## 2018-10-31 DIAGNOSIS — N319 Neuromuscular dysfunction of bladder, unspecified: Secondary | ICD-10-CM

## 2018-10-31 DIAGNOSIS — E1169 Type 2 diabetes mellitus with other specified complication: Secondary | ICD-10-CM

## 2018-10-31 DIAGNOSIS — N3001 Acute cystitis with hematuria: Secondary | ICD-10-CM

## 2018-10-31 LAB — POCT URINALYSIS DIP (CLINITEK)
Bilirubin, UA: NEGATIVE
Glucose, UA: NEGATIVE mg/dL
Ketones, POC UA: NEGATIVE mg/dL
Nitrite, UA: POSITIVE — AB
Spec Grav, UA: 1.01
Urobilinogen, UA: 0.2 U/dL
pH, UA: 7

## 2018-10-31 LAB — POCT GLYCOSYLATED HEMOGLOBIN (HGB A1C): Hemoglobin A1C: 11.4 % — AB (ref 4.0–5.6)

## 2018-10-31 LAB — GLUCOSE, POCT (MANUAL RESULT ENTRY): POC Glucose: 128 mg/dL — AB (ref 70–99)

## 2018-10-31 MED ORDER — CIPROFLOXACIN HCL 500 MG PO TABS: 500.0000 mg | ORAL_TABLET | Freq: Two times a day (BID) | ORAL | 0 refills | Status: DC

## 2018-10-31 MED ORDER — SIMVASTATIN 20 MG PO TABS: 20.0000 mg | ORAL_TABLET | Freq: Every day | ORAL | 6 refills | Status: DC

## 2018-10-31 MED ORDER — METFORMIN HCL 1000 MG PO TABS: 1000.0000 mg | ORAL_TABLET | Freq: Two times a day (BID) | ORAL | 6 refills | Status: DC

## 2018-10-31 MED ORDER — LISINOPRIL 20 MG PO TABS: 20.0000 mg | ORAL_TABLET | Freq: Every day | ORAL | 6 refills | Status: DC

## 2018-10-31 MED ORDER — GLIMEPIRIDE 4 MG PO TABS: ORAL_TABLET | ORAL | 6 refills | Status: DC

## 2018-10-31 MED ORDER — ASPIRIN EC 81 MG PO TBEC: 81.0000 mg | DELAYED_RELEASE_TABLET | Freq: Every day | ORAL | 3 refills | Status: DC

## 2018-10-31 MED FILL — CIPROFLOXACIN HCL 500 MG TA: 500 | 10 days supply | Qty: 20 | Fill #0

## 2018-10-31 MED FILL — ?SIMVASTATIN 20MG TABLETS: 20 | 30 days supply | Qty: 30 | Fill #1

## 2018-10-31 MED FILL — ?GLIMEPIRIDE 4 MG TABLET: 4 | 30 days supply | Qty: 30 | Fill #1

## 2018-10-31 MED FILL — ?METFORMIN HCL 1000MG TABS: 1000 | 30 days supply | Qty: 60 | Fill #1

## 2018-10-31 MED FILL — LISINOPRIL 20 MG TAB: 20 | 30 days supply | Qty: 30 | Fill #1

## 2018-10-31 NOTE — Progress Notes (Signed)
Established Patient Office Visit  Subjective:  Patient ID: John Meza, male    DOB: 1961/06/03  Age: 58 y.o. MRN: [id number]   Due to a language barrier, Stratus video interpretation system was used at today's visit  CC:  Chief Complaint  Patient presents with  . Follow-up    DM    HPI Nasif Meza presents for follow-up of diabetes, hypertension and hyperlipidemia.  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  At today's visit, patient states that he has not been checking his home blood sugars on a regular basis but has been taking his blood pressure medications.  Per CMA, there is a question as to whether or not he has been taking both medications prescribed for his diabetes.  Patient has been on metformin and Amaryl and at his last visit his Amaryl was increased to 4 mg daily.  Patient at today's visit questions how he would know if he has a bladder infection.  Patient states that when he initially was on the hospital after suffering a spinal cord injury, patient was told that because he does not have any sensation to his bladder, he may have symptoms such as headache or fever.  Patient is concerned that he may currently have a bladder infection.  Discussed other possible symptoms of a bladder infection with the patient and patient and wife have also noticed that his urine has had a stronger odor and patient has had some lower back pain but he thought that this was secondary to sitting in his wheelchair.  Patient has noticed at times that his urine had a darker yellow color and patient states when this occurs he tries to increase his water intake until the urine becomes lighter in color again.  Patient does not feel as if he has had any fever, chills or nausea.      Before patient's last visit, patient did have an issue with a ulcer on the buttock.  Patient and  wife report no issues with skin breakdown at this time.  Patient is taking his blood pressure medicine and patient denies any issues with headaches or dizziness related to his blood pressure.  Patient continues to take his cholesterol medication and he does not feel that he is having any issues with increased muscle aches.  Patient does have some increased fatigue but no other significant issues.  Past Medical History:  Diagnosis Date  . Cellulitis and abscess of buttock 10/2016  . Diabetes mellitus   . Hypertension   . Paraplegia (HCC) 2013   fell from ladder  . SCI (spinal cord injury)   . Spine fracture 11/16/2011   T 11- T9-L1    Past Surgical History:  Procedure Laterality Date  . CHOLECYSTECTOMY N/A 05/01/2015   Procedure: LAPAROSCOPIC CHOLECYSTECTOMY WITH ATTEMPTED INTRAOPERATIVE CHOLANGIOGRAM;  Surgeon: Violeta Gelinas, MD;  Location: [facility] OR;  Service: General;  Laterality: N/A;  . SPINE SURGERY      Family History  Problem Relation Age of Onset  . Diabetes Father   . Diabetes Sister     Social History   Socioeconomic History  . Marital status: Married    Spouse name: Not on file  . Number of children: Not on file  . Years of education: Not on file  . Highest education level: Not on file  Occupational History  . Not on file  Social Needs  . Financial resource strain: Not on file  . Food insecurity:    Worry: Not on file    Inability: Not on file  . Transportation needs:    Medical: Not on file    Non-medical: Not on file  Tobacco Use  . Smoking status: Former Smoker    Packs/day: 0.10    Years: 5.00    Pack years: 0.50    Last attempt to quit: 10/13/2011    Years since quitting: 7.0  . Smokeless tobacco: Never Used  Substance and Sexual Activity  . Alcohol use: Yes    Alcohol/week: 1.0 standard drinks    Types: 1 Cans of beer per week  . Drug use: No  . Sexual activity: Not on file  Lifestyle  . Physical activity:    Days per week: Not on file    Minutes  per session: Not on file  . Stress: Not on file  Relationships  . Social connections:    Talks on phone: Not on file    Gets together: Not on file    Attends religious service: Not on file    Active member of club or organization: Not on file    Attends meetings of clubs or organizations: Not on file    Relationship status: Not on file  . Intimate partner violence:    Fear of current or ex partner: Not on file    Emotionally abused: Not on file    Physically abused: Not on file    Forced sexual activity: Not on file  Other Topics Concern  . Not on file  Social History Narrative  . Not on file    Outpatient Medications Prior to Visit  Medication Sig Dispense Refill  . amoxicillin-clavulanate (AUGMENTIN) 875-125 MG tablet Take 1 tablet by mouth every 12 (twelve) hours. (Patient not taking: Reported on 07/01/2018) 10 tablet 0  . doxycycline (VIBRAMYCIN) 100 MG capsule Take 1 capsule (100 mg total) by mouth 2 (two) times daily. One po bid x 7 days (Patient not taking: Reported on 07/01/2018) 14 capsule 0  . lovastatin (MEVACOR) 10 MG tablet Take 10 mg by mouth at bedtime.    [name] omeprazole (PRILOSEC) 20 MG capsule Take 1 capsule (20 mg total) by mouth daily. To reduce stomach acid 30 capsule 11  . ondansetron (ZOFRAN) 4 MG tablet Take 1 tablet (4 mg total) by mouth every 6 (six) hours. (Patient not taking: Reported on 07/01/2018) 12 tablet 0  . glimepiride (AMARYL) 4 MG tablet Take 1 tablet (4 mg total) by mouth daily with breakfast. 30 tablet 6  . lisinopril (PRINIVIL,ZESTRIL) 20 MG tablet Take 1 tablet (20 mg total) by mouth daily. 30 tablet 6  . metFORMIN (GLUCOPHAGE) 1000 MG tablet Take 1 tablet (1,000 mg total) by mouth 2 (two) times daily with a meal. 60 tablet 6  . simvastatin (ZOCOR) 20 MG tablet Take 1 tablet (20 mg total) by mouth at bedtime. To lower cholesterol 30 tablet 6   No facility-administered medications prior to visit.     Allergies  Allergen Reactions  . Other  Itching, Rash and Other (See Comments)    ANTIBIOTIC? CAUSED RASH, ITCHING IN 2013-PER FAMILY Possibly niacin causing flushing reaction- as described by family?    ROS Review of Systems  Constitutional: Positive for fatigue. Negative for chills and fever.  HENT: Negative for sore throat and trouble swallowing.   Eyes: Negative for photophobia and visual disturbance.  Respiratory: Negative for cough and shortness  of breath.   Gastrointestinal: Negative for abdominal pain, constipation and diarrhea.  Endocrine: Positive for polydipsia (sometimes). Negative for polyphagia.  Genitourinary: Positive for difficulty urinating (has to self cath due to neurogenic bladder). Negative for hematuria.  Musculoskeletal: Positive for back pain and gait problem.  Neurological: Positive for weakness and numbness. Negative for dizziness and headaches.  Hematological: Negative for adenopathy. Does not bruise/bleed easily.      Objective:    Physical Exam  Constitutional: He is oriented to person, place, and time. He appears well-developed and well-nourished. No distress.  Well-nourished well-developed male in no acute distress sitting in a wheelchair.  Patient is accompanied by his wife at today's visit  HENT:  Head: Normocephalic and atraumatic.  Right Ear: External ear normal.  Left Ear: External ear normal.  Nose: Nose normal.  Mouth/Throat: Oropharynx is clear and moist.  Eyes: Pupils are equal, round, and reactive to light. Conjunctivae are normal. No scleral icterus.  Neck: Normal range of motion. Neck supple. No JVD present.  Cardiovascular: Normal rate, regular rhythm and normal heart sounds.  Pulmonary/Chest: Effort normal and breath sounds normal. No respiratory distress.  Abdominal: Soft. Bowel sounds are normal. There is no abdominal tenderness.  Musculoskeletal:        General: Edema present. No tenderness.     Comments: Mild, nonpitting dependent distal lower extremity puffiness    Lymphadenopathy:    He has no cervical adenopathy.  Neurological: He is alert and oriented to person, place, and time. No cranial nerve deficit.  Patient with below the waist paralysis; monofilament foot exam was not performed and does not need to be performed as patient with absence of sensation in the feet/lower extremities secondary to paraplegia due to spinal cord injury  Skin: Skin is warm and dry. No rash noted.  Diabetic foot exam- patient has no active skin breakdown on the feet.  Skin is soft.  Patient with good nail care.  Patient with 1+ dorsalis pedis and posterior tibialis pulses.  Monofilament exam was not done as patient does not have any sensation in his feet/lower extremity secondary to paralysis/paraplegia  Psychiatric: He has a normal mood and affect. His behavior is normal. Judgment and thought content normal.  Nursing note and vitals reviewed.   BP 110/73 (BP Location: Right Arm, Patient Position: Sitting, Cuff Size: Normal)   Pulse 68   Temp 98.7 F (37.1 C) (Oral)   Resp 18   Ht 5\' 6"  (1.676 m)   SpO2 100%   BMI 27.28 kg/m  Wt Readings from Last 3 Encounters:  12/17/17 169 lb (76.7 kg)  11/03/16 169 lb 8 oz (76.9 kg)  12/30/15 160 lb (72.6 kg)     Health Maintenance Due  Topic Date Due  . FOOT EXAM  05/10/1973  . OPHTHALMOLOGY EXAM  05/10/1973  . HIV Screening  05/10/1978  . COLONOSCOPY  05/10/2013    There are no preventive care reminders to display for this patient.  Lab Results  Component Value Date   TSH 3.980 07/01/2018   Lab Results  Component Value Date   WBC 6.0 12/17/2017   HGB 13.9 12/17/2017   HCT 41.6 12/17/2017   MCV 88.1 12/17/2017   PLT 268 12/17/2017   Lab Results  Component Value Date   NA 133 (L) 07/01/2018   K 4.6 07/01/2018   CO2 20 07/01/2018   GLUCOSE 137 (H) 07/01/2018   BUN 23 07/01/2018   CREATININE 1.17 07/01/2018   BILITOT 0.3 07/01/2018  ALKPHOS 95 07/01/2018   AST 15 07/01/2018   ALT 14 07/01/2018    PROT 7.3 07/01/2018   ALBUMIN 4.3 07/01/2018   CALCIUM 9.5 07/01/2018   ANIONGAP 11 12/17/2017   Lab Results  Component Value Date   CHOL 145 07/01/2018   Lab Results  Component Value Date   HDL 38 (L) 07/01/2018   Lab Results  Component Value Date   LDLCALC 73 07/01/2018   Lab Results  Component Value Date   TRIG 171 (H) 07/01/2018   Lab Results  Component Value Date   CHOLHDL 3.8 07/01/2018   Lab Results  Component Value Date   HGBA1C 11.4 (A) 10/31/2018      Assessment & Plan:   1. Uncontrolled type 2 diabetes mellitus with hyperglycemia Patient's hemoglobin A1c at today's visit is elevated at 11.4 and patient's last hemoglobin A1c was elevated at 10.8 on 07/01/2018.  Patient's Amaryl was increased from 2 mg to 4 mg at his last visit.  Patient however has not been monitoring his blood sugars so he is not sure how they have been running at home.  Patient's blood sugar at today's visit however was only 128.  Patient is encouraged to start checking his blood sugars regularly and keep a blood sugar diary. Return in about 4 weeks for consultation with the clinical pharmacist regarding diabetes.   Patient also however has a urinary tract infection at today's visit which may have been contributing to elevated blood sugars.  Patient is provided with refills of his metformin and Amaryl as well as refill of lisinopril for hypertension and simvastatin.  Daily 81 mg aspirin is also being added not only to help with cardiovascular risk but also because of patient's paraplegia he may be susceptible to formation of the lower extremity blood clots/DVTs.  Patient does not require monofilament exam of the feet due to his paraplegia.  Patient did not have any skin breakdown and it appears that the wife is taking good care of his feet.  Patient with good nail care.  Diabetic foot care was discussed with patient and wife at today's visit. - HgB A1c - Glucose (CBG) - POCT URINALYSIS DIP  (CLINITEK) - metFORMIN (GLUCOPHAGE) 1000 MG tablet; Take 1 tablet (1,000 mg total) by mouth 2 (two) times daily with a meal. To lower blood sugar  Dispense: 60 tablet; Refill: 6 - lisinopril (PRINIVIL,ZESTRIL) 20 MG tablet; Take 1 tablet (20 mg total) by mouth daily.  Dispense: 30 tablet; Refill: 6 - glimepiride (AMARYL) 4 MG tablet; 1 pill by mouth before breakfast to help lower blood sugar  Dispense: 30 tablet; Refill: 6 - simvastatin (ZOCOR) 20 MG tablet; Take 1 tablet (20 mg total) by mouth at bedtime. To lower cholesterol  Dispense: 30 tablet; Refill: 6 - aspirin EC 81 MG tablet; Take 1 tablet (81 mg total) by mouth daily. Take after eating  Dispense: 90 tablet; Refill: 3 - CBC with Differential  2. Essential hypertension Patient's blood pressure is stable and controlled on his current lisinopril 20 mg and patient is provided with a refill at today's visit. - lisinopril (PRINIVIL,ZESTRIL) 20 MG tablet; Take 1 tablet (20 mg total) by mouth daily.  Dispense: 30 tablet; Refill: 6  3. Neurogenic bladder Patient status post T11 fracture after falling off of a roof and patient with paralysis from the waist down and neurogenic bladder requiring self catheterization.  Patient reports that he has not had follow-up with urology since his initial injury.  Patient feels as if  he gets recurrent urinary tract infections.  Patient will have urinalysis at today's visit.  Patient did remember to bring sterile catheter to today's visit. He will be referred to Urology for further evaluation. - POCT URINALYSIS DIP (CLINITEK) - ciprofloxacin (CIPRO) 500 MG tablet; Take 1 tablet (500 mg total) by mouth 2 (two) times daily. Needs to pick up and start medication today  Dispense: 20 tablet; Refill: 0 - Ambulatory referral to Urology  4. Paraplegia following spinal cord injury [facility]); wheelchair dependence Patient with wheelchair dependence status post paraplegia from T11 spinal cord injury after falling off of a roof  at work.  He and his wife report no current issues with patient having skin breakdown or ulcers at this time.  Continue daily skin inspections and skin care. Start daily 81 mg aspirin to help prevent issues with DVT's due to immobility. - aspirin EC 81 MG tablet; Take 1 tablet (81 mg total) by mouth daily. Take after eating  Dispense: 90 tablet; Refill: 3 - CBC with Differential - Ambulatory referral to Urology  5. Hypomagnesemia Will check magnesium level as patient has had prior low magnesium levels  6. Encounter for long-term (current) use of medications CBC, CMET and UA will be done in follow-up of his use of medications. - Comprehensive metabolic panel - POCT URINALYSIS DIP (CLINITEK)  7. Hyperlipidemia associated with type 2 diabetes mellitus (HCC) Simvastatin refilled at today's visit  8. Acute cystitis with hematuria UA at today's visit was abnormal with small blood, positive nitrites and leukocytes. RX sent in for Cipro 500 mg twice daily for 10 days. Referral placed for follow-up with Urology - ciprofloxacin (CIPRO) 500 MG tablet; Take 1 tablet (500 mg total) by mouth 2 (two) times daily. Needs to pick up and start medication today  Dispense: 20 tablet; Refill: 0 - Ambulatory referral to Urology  Meds ordered this encounter  Medications  . metFORMIN (GLUCOPHAGE) 1000 MG tablet    Sig: Take 1 tablet (1,000 mg total) by mouth 2 (two) times daily with a meal. To lower blood sugar    Dispense:  60 tablet    Refill:  6  . lisinopril (PRINIVIL,ZESTRIL) 20 MG tablet    Sig: Take 1 tablet (20 mg total) by mouth daily.    Dispense:  30 tablet    Refill:  6  . glimepiride (AMARYL) 4 MG tablet    Sig: 1 pill by mouth before breakfast to help lower blood sugar    Dispense:  30 tablet    Refill:  6  . simvastatin (ZOCOR) 20 MG tablet    Sig: Take 1 tablet (20 mg total) by mouth at bedtime. To lower cholesterol    Dispense:  30 tablet    Refill:  6  . aspirin EC 81 MG tablet     Sig: Take 1 tablet (81 mg total) by mouth daily. Take after eating    Dispense:  90 tablet    Refill:  3   An After Visit Summary was printed and given to the patient.  Follow-up: Return for DM- 4 weeks with Franky Macho; 8 weeks with PCP.    Cain Saupe, MD

## 2018-11-01 LAB — CBC WITH DIFFERENTIAL/PLATELET
Basophils Absolute: 0 x10E3/uL (ref 0.0–0.2)
Basos: 1 %
EOS (ABSOLUTE): 0.1 x10E3/uL (ref 0.0–0.4)
Eos: 2 %
Hematocrit: 43.9 % (ref 37.5–51.0)
Hemoglobin: 14.8 g/dL (ref 13.0–17.7)
Immature Grans (Abs): 0 x10E3/uL (ref 0.0–0.1)
Immature Granulocytes: 0 %
Lymphocytes Absolute: 2.3 x10E3/uL (ref 0.7–3.1)
Lymphs: 42 %
MCH: 29.5 pg (ref 26.6–33.0)
MCHC: 33.7 g/dL (ref 31.5–35.7)
MCV: 88 fL (ref 79–97)
Monocytes Absolute: 0.4 x10E3/uL (ref 0.1–0.9)
Monocytes: 7 %
Neutrophils Absolute: 2.6 x10E3/uL (ref 1.4–7.0)
Neutrophils: 48 %
Platelets: 197 x10E3/uL (ref 150–450)
RBC: 5.01 x10E6/uL (ref 4.14–5.80)
RDW: 13.1 % (ref 11.6–15.4)
WBC: 5.4 x10E3/uL (ref 3.4–10.8)

## 2018-11-01 LAB — COMPREHENSIVE METABOLIC PANEL WITH GFR
ALT: 14 IU/L (ref 0–44)
AST: 15 IU/L (ref 0–40)
Albumin/Globulin Ratio: 1.5 (ref 1.2–2.2)
Albumin: 4.4 g/dL (ref 3.8–4.9)
Alkaline Phosphatase: 88 IU/L (ref 39–117)
BUN/Creatinine Ratio: 24 — ABNORMAL HIGH (ref 9–20)
BUN: 17 mg/dL (ref 6–24)
Bilirubin Total: 0.4 mg/dL (ref 0.0–1.2)
CO2: 19 mmol/L — ABNORMAL LOW (ref 20–29)
Calcium: 9.7 mg/dL (ref 8.7–10.2)
Chloride: 102 mmol/L (ref 96–106)
Creatinine, Ser: 0.71 mg/dL — ABNORMAL LOW (ref 0.76–1.27)
GFR calc Af Amer: 122 mL/min/1.73
GFR calc non Af Amer: 106 mL/min/1.73
Globulin, Total: 2.9 g/dL (ref 1.5–4.5)
Glucose: 119 mg/dL — ABNORMAL HIGH (ref 65–99)
Potassium: 4.7 mmol/L (ref 3.5–5.2)
Sodium: 139 mmol/L (ref 134–144)
Total Protein: 7.3 g/dL (ref 6.0–8.5)

## 2018-11-05 ENCOUNTER — Encounter: Payer: Self-pay | Admitting: Family Medicine

## 2018-11-28 ENCOUNTER — Ambulatory Visit: Payer: Self-pay | Attending: Family Medicine | Admitting: Pharmacist

## 2018-11-28 ENCOUNTER — Ambulatory Visit: Payer: Self-pay | Attending: Family Medicine

## 2018-11-28 DIAGNOSIS — E11649 Type 2 diabetes mellitus with hypoglycemia without coma: Secondary | ICD-10-CM

## 2018-11-28 LAB — GLUCOSE, POCT (MANUAL RESULT ENTRY): POC Glucose: 192 mg/dL — AB (ref 70–99)

## 2018-11-28 MED ORDER — ATORVASTATIN CALCIUM 20 MG PO TABS: 20.0000 mg | ORAL_TABLET | Freq: Every day | ORAL | 2 refills | Status: DC

## 2018-11-28 MED ORDER — SITAGLIPTIN PHOS-METFORMIN HCL 50-1000 MG PO TABS: 1.0000 | ORAL_TABLET | Freq: Two times a day (BID) | ORAL | 2 refills | Status: DC

## 2018-11-28 MED FILL — !JANUMET 50-1000MG TABLET: 50-1000 | 30 days supply | Qty: 60 | Fill #0

## 2018-11-28 MED FILL — LISINOPRIL 20 MG TAB: 20 | 30 days supply | Qty: 30 | Fill #2

## 2018-11-28 MED FILL — ?ATORVASTATIN 20 MG TABLET: 20 | 30 days supply | Qty: 30 | Fill #0

## 2018-11-28 NOTE — Progress Notes (Signed)
    S:    PCP: Dr. Jillyn Hidden  No chief complaint on file.  Patient arrives in good spirits.Presents for diabetes management at the request of Dr. Jillyn Hidden. Patient was referred on 10/31/18. No changes were made to DM medications.  Family/Social History:  - Fhx: DM (father, sister) - Tobacco: former smoker (quit 2013) - Alcohol: 1 beer a week  Insurance coverage/medication affordability: self-pay  Patient denies adherence with medications. Several mixed bottles with mismatched pills. Current diabetes medications include: glimepiride 4 mg daily, metformin 1000 mg BID Current hypertension medications include: lisinopril 20 mg (microalb in september 154)   Patient denies hypoglycemic events.  Patient reported dietary habits: - Eggs, tortillas, and vegetables  - Drinks "Zero" soda   Patient-reported exercise habits:  - pt is paraplegic   Patient denies polyuria, polyphagia, or polydipsia.  Patient denies neuropathy. Patient denies visual changes. Patient reports self foot exams.   O:  POCT glucose: 192 Home fasting CBG: 125 - 250 (14 out of 21 days in the 100s; 7 out 21 in 200s) - last saw PCP 10/31/18 2 hour post-prandial/random CBG: does not monitor  Lab Results  Component Value Date   HGBA1C 11.4 (A) 10/31/2018   There were no vitals filed for this visit.  Lipid Panel     Component Value Date/Time   CHOL 145 07/01/2018 1155   TRIG 171 (H) 07/01/2018 1155   HDL 38 (L) 07/01/2018 1155   CHOLHDL 3.8 07/01/2018 1155   CHOLHDL 11.5 05/05/2015 0352   VLDL 35 05/05/2015 0352   LDLCALC 73 07/01/2018 1155   Clinical ASCVD: No  The 10-year ASCVD risk score Denman George DC Jr., et al., 2013) is: 8.3%   Values used to calculate the score:     Age: 58 years     Sex: Male     Is Non-Hispanic African American: No     Diabetic: Yes     Tobacco smoker: No     Systolic Blood Pressure: 110 mmHg     Is BP treated: Yes     HDL Cholesterol: 38 mg/dL     Total Cholesterol: 145 mg/dL    A/P: Diabetes longstanding currently uncontrolled. Patient is able to verbalize appropriate hypoglycemia management plan. Patient is not adherent with medication. Control is suboptimal due to medication noncompliance, dietary indiscretion.  Medication reconciliation provided - will have patient stop glimepiride and metformin. I think he may do well on Janumet. His A1c is quite high, however, his home CBGs have responded to oral medications. Janumet will decrease pill burden as well. He may require insulin eventually, but he does not wish to start at this time.   -Discontinued basal insulin glimepiride and metformin. -Started Janumet 50-1000 mg BID.  -Extensively discussed pathophysiology of DM, recommended lifestyle interventions, dietary effects on glycemic control -Counseled on s/sx of and management of hypoglycemia -UTD on appropriate vaccines -Next A1C anticipated 01/2019  ASCVD risk - primary prevention in patient with DM. Last LDL is controlled. ASCVD risk score is not >20%  - moderate intensity statin indicated. Pt has bottles of lovastatin and simvastatin. Will stop these and start atorvastatin 20 mg daily.  -Discontinued lovastatin, simvastatin. -Start atorvastatin 20 mg daily.    Written patient instructions provided. Total time in face to face counseling 30 minutes.   Follow up pharmacist visit in 3 weeks.     Patient seen with:

## 2018-11-28 NOTE — Patient Instructions (Addendum)
Gracias por venir a verme hoy. Por favor haga lo siguiente:   1. Deja de metformina 2. Dejar de glimepirida 3. Comience janumet. Tome 1 tableta por la maana y 1 tableta por la tarde. 4. Comience con atorvastatina: tome 1 tableta por la noche. 5. Contine controlando el azcar en la sangre en casa. Es realmente importante que registre estos y los traiga a su prxima cita con el mdico. Si obtiene lecturas superiores a 500 o inferiores a 70, llmeme a m o a Glass blower/designer para informar a su mdico. Vea a continuacin cmo tratar el nivel bajo de azcar en la sangre.  6. Continen haciendo los cambios de estilo de vida que hemos discutido juntos durante nuestra visita. La dieta y el ejercicio juegan un papel importante en la mejora del azcar en la sangre.  7. Seguimiento conmigo en 3 weeks.  Hipoglucemia o bajo nivel de azcar en la sangre:   El nivel bajo de azcar en la sangre puede ocurrir rpidamente y puede convertirse en una emergencia si no se trata de inmediato.   Si bien esto no debera suceder con frecuencia, se puede provocar si se saltea una comida o no come lo suficiente. Adems, si su insulina u otros medicamentos para la diabetes se dosifican demasiado, esto puede hacer que su azcar en la sangre baje.  Las seales de advertencia de niveles bajos de azcar en la sangre incluyen:  1. Sentirse tembloroso o [employer] . 2.Sentirse dbil o cansado.  3. Hambre excesiva  4. Sentirse ansioso o molesto  5. Sudando incluso cuando no hace ejercicio   Qu hacer si tengo un nivel bajo de azcar en la sangre?  1. Controle su nivel de azcar en la sangre con su medidor. Si es inferior a 70, contine con el paso 2.  2. Tratar con 3-4 tabletas de glucosa o 3 paquetes de azcar regular. Si no estn disponibles, puedes probar un caramelo duro. Otra opcin sera beber 4 onzas de jugo de frutas o 6 onzas de refresco REGULAR.  3.Vuelva a revisar su azcar en 15 minutos. Si todava est por debajo de  70, haga lo que hizo en el paso 2 nuevamente. Si ha vuelto a subir, contine y coma un refrigerio o una comida pequea en este momento.

## 2018-11-29 ENCOUNTER — Encounter: Payer: Self-pay | Admitting: Pharmacist

## 2018-12-19 ENCOUNTER — Encounter: Payer: Self-pay | Admitting: Pharmacist

## 2018-12-19 ENCOUNTER — Ambulatory Visit: Payer: Worker's Compensation | Attending: Family Medicine | Admitting: Pharmacist

## 2018-12-19 DIAGNOSIS — E11649 Type 2 diabetes mellitus with hypoglycemia without coma: Secondary | ICD-10-CM

## 2018-12-19 MED ORDER — GLIMEPIRIDE 4 MG PO TABS: 4.0000 mg | ORAL_TABLET | Freq: Every day | ORAL | 2 refills | Status: DC

## 2018-12-19 MED FILL — ?GLIMEPIRIDE 4 MG TABLET: 4 | 30 days supply | Qty: 30 | Fill #0

## 2018-12-19 NOTE — Progress Notes (Signed)
    S:    PCP: Dr. Jillyn Hidden  No chief complaint on file.  Patient arrives in good spirits. Presents for diabetes management at the request of Dr. Jillyn Hidden. Patient was referred on 10/31/18. I last saw him 11/28/18 - stopped his single agent glimepiride and metformin and started Janumet d/t compliance issues.   Family/Social History:  - Fhx: DM (father, sister) - Tobacco: former smoker (quit 2013) - Alcohol: 1 beer a week  Insurance coverage/medication affordability: self-pay  Patient reports adherence with medications.  Current diabetes medications include: Janumet 50-1000 mg BID Current hypertension medications include: lisinopril 20 mg (microalb in september 154)   Patient denies hypoglycemic events.  Patient reported dietary habits: - Eggs, tortillas, and vegetables  - Drinks "Zero" soda   Patient-reported exercise habits:  - pt is paraplegic   Patient denies polyuria, polyphagia, or polydipsia.  Patient denies neuropathy. Patient denies visual changes. Patient reports self foot exams.   O:  POCT glucose: 258 Home fasting CBG: 190 - 270  Lab Results  Component Value Date   HGBA1C 11.4 (A) 10/31/2018   There were no vitals filed for this visit.  Lipid Panel     Component Value Date/Time   CHOL 145 07/01/2018 1155   TRIG 171 (H) 07/01/2018 1155   HDL 38 (L) 07/01/2018 1155   CHOLHDL 3.8 07/01/2018 1155   CHOLHDL 11.5 05/05/2015 0352   VLDL 35 05/05/2015 0352   LDLCALC 73 07/01/2018 1155   Clinical ASCVD: No  The 10-year ASCVD risk score Denman George DC Jr., et al., 2013) is: 8.3%   Values used to calculate the score:     Age: 55 years     Sex: Male     Is Non-Hispanic African American: No     Diabetic: Yes     Tobacco smoker: No     Systolic Blood Pressure: 110 mmHg     Is BP treated: Yes     HDL Cholesterol: 38 mg/dL     Total Cholesterol: 145 mg/dL   A/P: Diabetes longstanding currently uncontrolled. Patient is able to verbalize appropriate hypoglycemia  management plan. Patient is adherent with medication. Control is suboptimal due to dietary indiscretion. Of note, he endorses compliance which he did not at previous visit. I feel confident that we can now now add back glimepiride and he will be adherent.  -Re-started glimepiride 4 mg with breakfast -Started Janumet 50-1000 mg BID.  -Extensively discussed pathophysiology of DM, recommended lifestyle interventions, dietary effects on glycemic control -Counseled on s/sx of and management of hypoglycemia -UTD on appropriate vaccines -Next A1C anticipated 01/2019  ASCVD risk - primary prevention in patient with DM. Last LDL is controlled. ASCVD risk score is not >20%  - moderate intensity statin indicated. Pt is tolerating atorvastatin well. -Continue atorvastatin 20 mg daily.    Written patient instructions provided. Total time in face to face counseling 30 minutes.   Follow up pharmacist visit in 3 weeks.

## 2018-12-19 NOTE — Patient Instructions (Signed)
Gracias por venir a verme hoy. Por favor haga lo siguiente:   1. Continuar Janumet en la maana y la tarde.  2. Comience glimepirida 4 mg al da por la maana antes del desayuno. Recuerde, debe comer despus de tomar para evitar niveles bajos de azcar en la sangre.  3.Contine controlando el azcar en la sangre en casa.  4.Continen haciendo los cambios de estilo de vida que hemos discutido juntos durante nuestra visita. La dieta y el ejercicio juegan un papel importante en la mejora del azcar en la sangre.  5.Seguimiento con el Dr. Jillyn Hidden el [address] de [place].    English translation:  Thank you for coming to see me today. Please do the following:  1. Continue Janumet in the morning and the evening. 2. Start glimepiride 4 mg daily in the morning before breakfast. Remember, you must eat after taking to avoid low blood sugar.  3. Continue checking blood sugars at home.  4. Continue making the lifestyle changes we've discussed together during our visit. Diet and exercise play a significant role in improving your blood sugars.  5. Follow-up with Dr. Jillyn Hidden on March 16th.

## 2018-12-26 ENCOUNTER — Encounter: Payer: Self-pay | Admitting: Family Medicine

## 2018-12-26 ENCOUNTER — Other Ambulatory Visit: Payer: Self-pay | Admitting: Pharmacist

## 2018-12-26 ENCOUNTER — Ambulatory Visit: Payer: Self-pay | Attending: Family Medicine | Admitting: Family Medicine

## 2018-12-26 ENCOUNTER — Other Ambulatory Visit: Payer: Self-pay

## 2018-12-26 VITALS — BP 118/78 | HR 87 | Temp 98.5°F | Resp 18 | Ht 67.0 in

## 2018-12-26 DIAGNOSIS — J309 Allergic rhinitis, unspecified: Secondary | ICD-10-CM

## 2018-12-26 DIAGNOSIS — I10 Essential (primary) hypertension: Secondary | ICD-10-CM

## 2018-12-26 DIAGNOSIS — Z7982 Long term (current) use of aspirin: Secondary | ICD-10-CM

## 2018-12-26 DIAGNOSIS — E785 Hyperlipidemia, unspecified: Secondary | ICD-10-CM

## 2018-12-26 DIAGNOSIS — G8220 Paraplegia, unspecified: Secondary | ICD-10-CM

## 2018-12-26 DIAGNOSIS — J029 Acute pharyngitis, unspecified: Secondary | ICD-10-CM

## 2018-12-26 DIAGNOSIS — E11649 Type 2 diabetes mellitus with hypoglycemia without coma: Secondary | ICD-10-CM

## 2018-12-26 DIAGNOSIS — E1169 Type 2 diabetes mellitus with other specified complication: Secondary | ICD-10-CM

## 2018-12-26 LAB — GLUCOSE, POCT (MANUAL RESULT ENTRY): POC Glucose: 206 mg/dL — AB (ref 70–99)

## 2018-12-26 MED ORDER — OMEPRAZOLE 20 MG PO CPDR: 20.0000 mg | DELAYED_RELEASE_CAPSULE | Freq: Every day | ORAL | 3 refills | Status: DC

## 2018-12-26 MED ORDER — ATORVASTATIN CALCIUM 20 MG PO TABS: 20.0000 mg | ORAL_TABLET | Freq: Every day | ORAL | 3 refills | Status: DC

## 2018-12-26 MED ORDER — METFORMIN HCL 1000 MG PO TABS: 1000.0000 mg | ORAL_TABLET | Freq: Two times a day (BID) | ORAL | 3 refills | Status: DC

## 2018-12-26 MED ORDER — BASAGLAR KWIKPEN 100 UNIT/ML ~~LOC~~ SOPN: PEN_INJECTOR | SUBCUTANEOUS | 99 refills | Status: DC

## 2018-12-26 MED ORDER — INSULIN GLARGINE 100 UNIT/ML SOLOSTAR PEN: 6.0000 [IU] | PEN_INJECTOR | Freq: Every day | SUBCUTANEOUS | 99 refills | Status: DC

## 2018-12-26 MED ORDER — LISINOPRIL 20 MG PO TABS: 20.0000 mg | ORAL_TABLET | Freq: Every day | ORAL | 3 refills | Status: DC

## 2018-12-26 MED ORDER — LORATADINE 10 MG PO TABS: 10.0000 mg | ORAL_TABLET | Freq: Every day | ORAL | 11 refills | Status: DC

## 2018-12-26 MED FILL — ?BASAGLAR 100 UNITS/ML KWPE: 100 | 84 days supply | Qty: 9 | Fill #0

## 2018-12-26 MED FILL — ?ATORVASTATIN 20 MG TABLET: 20 | 90 days supply | Qty: 90 | Fill #0

## 2018-12-26 MED FILL — ?METFORMIN HCL 1000 MG TAB: 1000 | 90 days supply | Qty: 180 | Fill #0

## 2018-12-26 MED FILL — LISINOPRIL 20 MG TAB: 20 | 90 days supply | Qty: 90 | Fill #0

## 2018-12-26 MED FILL — ?OMEPRAZOLE 20 MG CAPSULE D: 20 | 90 days supply | Qty: 90 | Fill #0

## 2018-12-26 NOTE — Progress Notes (Signed)
Established Patient Office Visit  Subjective:  Patient ID: John Meza, male    DOB: 04/27/1961  Age: 58 y.o. MRN: [id number]  CC: No chief complaint on file.   HPI John Meza presents for follow-up of Type 2 Diabetes for which he was most recently seen by the clinical pharmacist on 12/19/2018.  Patient was restarted on glimepiride 4 mg before the first meal of the day, Janumet 50-1000 twice daily and next hemoglobin A1c is due in April.  Patient's last hemoglobin A1c was done 10/31/2018 and was elevated at 11.4.  Patient does not believe that he has had any improvement in his blood sugars.  Patient reports that fasting blood sugars are still in the low 200s and blood sugars are in the mid to high 200s later in the day after meals.  Patient reports that he is compliant with the glimepiride and Janumet.  Patient does not feel as if he has had any side effects such as abdominal pain or diarrhea with the use of the medications.  Patient has had some occasional increased thirst.  He does not believe that he is having any increase in urinary output.      Patient also reports that he has had an itchy sore throat for a few days.  Patient denies any fever or chills, no headache or dizziness.  No shortness of breath or cough.  Patient does have some nasal congestion and sensation of postnasal drainage.  Patient denies any difficulty swallowing.      Patient also needs refills of some of his chronic medications for hypertension, and hyperlipidemia. He denies any headaches or dizziness related to his blood pressure which he believes has been well controlled. No muscle aches related to his use of cholesterol medication. He does not feel as if he is still having any issues with a bladder infection at this time. No mid-back pain and no odor to the urine or dark color to the urine. Patient continues to self cath.   Past Medical History:  Diagnosis Date  . Cellulitis and abscess of buttock 10/2016  .  Diabetes mellitus   . Hypertension   . Paraplegia (HCC) 2013   fell from ladder  . SCI (spinal cord injury)   . Spine fracture 11/16/2011   T 11- T9-L1    Past Surgical History:  Procedure Laterality Date  . CHOLECYSTECTOMY N/A 05/01/2015   Procedure: LAPAROSCOPIC CHOLECYSTECTOMY WITH ATTEMPTED INTRAOPERATIVE CHOLANGIOGRAM;  Surgeon: Violeta Gelinas, MD;  Location: [facility] OR;  Service: General;  Laterality: N/A;  . SPINE SURGERY      Family History  Problem Relation Age of Onset  . Diabetes Father   . Diabetes Sister     Social History   Socioeconomic History  . Marital status: Married    Spouse name: Not on file  . Number of children: Not on file  . Years of education: Not on file  . Highest education level: Not on file  Occupational History  . Not on file  Social Needs  . Financial resource strain: Not on file  . Food insecurity:    Worry: Not on file    Inability: Not on file  . Transportation needs:    Medical: Not on file    Non-medical: Not on file  Tobacco Use  . Smoking status: Former Smoker    Packs/day: 0.10    Years: 5.00    Pack years: 0.50    Last attempt to quit: 10/13/2011    Years since quitting:  7.2  . Smokeless tobacco: Never Used  Substance and Sexual Activity  . Alcohol use: Yes    Alcohol/week: 1.0 standard drinks    Types: 1 Cans of beer per week  . Drug use: No  . Sexual activity: Not on file  Lifestyle  . Physical activity:    Days per week: Not on file    Minutes per session: Not on file  . Stress: Not on file  Relationships  . Social connections:    Talks on phone: Not on file    Gets together: Not on file    Attends religious service: Not on file    Active member of club or organization: Not on file    Attends meetings of clubs or organizations: Not on file    Relationship status: Not on file  . Intimate partner violence:    Fear of current or ex partner: Not on file    Emotionally abused: Not on file    Physically abused: Not on  file    Forced sexual activity: Not on file  Other Topics Concern  . Not on file  Social History Narrative  . Not on file    Outpatient Medications Prior to Visit  Medication Sig Dispense Refill  . aspirin EC 81 MG tablet Take 1 tablet (81 mg total) by mouth daily. Take after eating 90 tablet 3  . atorvastatin (LIPITOR) 20 MG tablet Take 1 tablet (20 mg total) by mouth daily. 30 tablet 2  . glimepiride (AMARYL) 4 MG tablet Take 1 tablet (4 mg total) by mouth daily with breakfast. 30 tablet 2  . lisinopril (PRINIVIL,ZESTRIL) 20 MG tablet Take 1 tablet (20 mg total) by mouth daily. 30 tablet 6  . omeprazole (PRILOSEC) 20 MG capsule Take 1 capsule (20 mg total) by mouth daily. To reduce stomach acid 30 capsule 11  . sitaGLIPtin-metformin (JANUMET) 50-1000 MG tablet Take 1 tablet by mouth 2 (two) times daily with a meal. 60 tablet 2   No facility-administered medications prior to visit.     Allergies  Allergen Reactions  . Other Itching, Rash and Other (See Comments)    ANTIBIOTIC? CAUSED RASH, ITCHING IN 2013-PER FAMILY Possibly niacin causing flushing reaction- as described by family?    ROS Review of Systems  Constitutional: Negative for chills, fatigue and fever.  HENT: Positive for congestion, postnasal drip and sore throat. Negative for rhinorrhea, sneezing and trouble swallowing.   Eyes: Negative for photophobia and visual disturbance.  Respiratory: Negative for cough and shortness of breath.   Cardiovascular: Negative for chest pain, palpitations and leg swelling.  Gastrointestinal: Negative for abdominal pain, blood in stool, constipation, diarrhea and nausea.  Endocrine: Positive for polydipsia (occasional). Negative for polyphagia and polyuria.  Genitourinary:       Denies flank pain and no increased odor to the urine or change in color to the urine  Neurological: Positive for weakness and numbness. Negative for dizziness and headaches.  Hematological: Negative for  adenopathy. Does not bruise/bleed easily.      Objective:    Physical Exam  Constitutional: He is oriented to person, place, and time. He appears well-developed and well-nourished.  Older male in NAD in a standard wheelchair; patient is accompanied by his wife at today's visit  HENT:  Right Ear: Hearing, external ear and ear canal normal. Tympanic membrane is injected (mild).  Left Ear: Hearing, external ear and ear canal normal. Tympanic membrane is injected (mild).  Nose: Mucosal edema and rhinorrhea present.  Mouth/Throat: Mucous membranes are normal. Posterior oropharyngeal edema and posterior oropharyngeal erythema present.  Neck: Normal range of motion. Neck supple. No JVD present. No thyromegaly present.  Cardiovascular: Normal rate and regular rhythm.  Pulmonary/Chest: Effort normal and breath sounds normal. No respiratory distress.  Abdominal: Soft. There is no abdominal tenderness. There is no rebound and no guarding.  Musculoskeletal:        General: No tenderness or edema.  Lymphadenopathy:    He has no cervical adenopathy.  Neurological: He is alert and oriented to person, place, and time.  Skin: Skin is warm and dry.  Psychiatric: He has a normal mood and affect. His behavior is normal. Judgment and thought content normal.  Nursing note and vitals reviewed.     Wt Readings from Last 3 Encounters:  12/17/17 169 lb (76.7 kg)  11/03/16 169 lb 8 oz (76.9 kg)  12/30/15 160 lb (72.6 kg)     Health Maintenance Due  Topic Date Due  . FOOT EXAM  05/10/1973  . OPHTHALMOLOGY EXAM  05/10/1973  . HIV Screening  05/10/1978  . COLONOSCOPY  05/10/2013    There are no preventive care reminders to display for this patient.  Lab Results  Component Value Date   TSH 3.980 07/01/2018   Lab Results  Component Value Date   WBC 5.4 10/31/2018   HGB 14.8 10/31/2018   HCT 43.9 10/31/2018   MCV 88 10/31/2018   PLT 197 10/31/2018   Lab Results  Component Value Date   NA  139 10/31/2018   K 4.7 10/31/2018   CO2 19 (L) 10/31/2018   GLUCOSE 119 (H) 10/31/2018   BUN 17 10/31/2018   CREATININE 0.71 (L) 10/31/2018   BILITOT 0.4 10/31/2018   ALKPHOS 88 10/31/2018   AST 15 10/31/2018   ALT 14 10/31/2018   PROT 7.3 10/31/2018   ALBUMIN 4.4 10/31/2018   CALCIUM 9.7 10/31/2018   ANIONGAP 11 12/17/2017   Lab Results  Component Value Date   CHOL 145 07/01/2018   Lab Results  Component Value Date   HDL 38 (L) 07/01/2018   Lab Results  Component Value Date   LDLCALC 73 07/01/2018   Lab Results  Component Value Date   TRIG 171 (H) 07/01/2018   Lab Results  Component Value Date   CHOLHDL 3.8 07/01/2018   Lab Results  Component Value Date   HGBA1C 11.4 (A) 10/31/2018      Assessment & Plan:  1. Uncontrolled type 2 diabetes mellitus with hypoglycemia, unspecified hypoglycemia coma status (HCC) Patient and wife report that patient's blood sugars remain elevated in the 200's despite recent changed in medications. Discussed with patient and his wife that patient will be started on Lantus initially at 6 units daily and Janumet will be discontinued . Patient will continue use of metformin (but not Januvia) and new RX provided. Patient will for now continue Amaryl but this will likely be stopped at next visit and Lantus titrated. Patient reports that he did take insulin in the past after the initial hospitalization when he had a workplace injury resulting in paraplegia. He reports familiarity with self administration of insulin.  - Glucose (CBG) - metFORMIN (GLUCOPHAGE) 1000 MG tablet; Take 1 tablet (1,000 mg total) by mouth 2 (two) times daily with a meal.  Dispense: 180 tablet; Refill: 3 - atorvastatin (LIPITOR) 20 MG tablet; Take 1 tablet (20 mg total) by mouth daily. To lower cholesterol  Dispense: 90 tablet; Refill: 3 - lisinopril (PRINIVIL,ZESTRIL) 20 MG tablet;  Take 1 tablet (20 mg total) by mouth daily. To lower blood pressure  Dispense: 90 tablet;  Refill: 3  2. Sore throat Patient with complaint of recent onset of sore throat and discussed with patient and wife that his sore throat is likely secondary to allergic rhinitis and postnasal drainage.  Prescription provided for loratadine 10 mg to take 1 daily as needed for congestion/postnasal drainage.  Patient should call or return if his symptoms are not improving. - loratadine (CLARITIN) 10 MG tablet; Take 1 tablet (10 mg total) by mouth daily. As needed for itchy throat/allergy symptoms  Dispense: 30 tablet; Refill: 11  3. Allergic rhinitis, unspecified seasonality, unspecified trigger Patient with allergic rhinitis based on exam and RX sent to pharmacy for loratadine - loratadine (CLARITIN) 10 MG tablet; Take 1 tablet (10 mg total) by mouth daily. As needed for itchy throat/allergy symptoms  Dispense: 30 tablet; Refill: 11  4. Essential hypertension Blood pressures currently well controlled on lisinopril 20 mg and is below goal of 130/80 at today's visit. - lisinopril (PRINIVIL,ZESTRIL) 20 MG tablet; Take 1 tablet (20 mg total) by mouth daily. To lower blood pressure  Dispense: 90 tablet; Refill: 3  5. Hyperlipidemia associated with type 2 diabetes mellitus (HCC) Continue low-fat diet along with the use of atorvastatin 20 mg - atorvastatin (LIPITOR) 20 MG tablet; Take 1 tablet (20 mg total) by mouth daily. To lower cholesterol  Dispense: 90 tablet; Refill: 3  6. Paraplegia following spinal cord injury (HCC) Continue good skin hygiene to avoid skin breakdown and report any issues with pressure ulcers or other concerns  7. Long-term use of aspirin therapy Continue omeprazole secondary to long-term use of aspirin therapy.officevisit - omeprazole (PRILOSEC) 20 MG capsule; Take 1 capsule (20 mg total) by mouth daily. To reduce stomach acid  Dispense: 90 capsule; Refill: 3  An After Visit Summary was printed and given to the patient.  Allergies as of 12/26/2018      Reactions   Other  Itching, Rash, Other (See Comments)   ANTIBIOTIC? CAUSED RASH, ITCHING IN 2013-PER FAMILY Possibly niacin causing flushing reaction- as described by family?      Medication List       Accurate as of December 26, 2018 11:59 PM. Always use your most recent med list.        aspirin EC 81 MG tablet Take 1 tablet (81 mg total) by mouth daily. Take after eating   atorvastatin 20 MG tablet Commonly known as:  LIPITOR Take 1 tablet (20 mg total) by mouth daily. To lower cholesterol   Basaglar KwikPen 100 UNIT/ML Sopn Inject 6 units into the skin daily to lower blood sugar   glimepiride 4 MG tablet Commonly known as:  AMARYL Take 1 tablet (4 mg total) by mouth daily with breakfast.   lisinopril 20 MG tablet Commonly known as:  PRINIVIL,ZESTRIL Take 1 tablet (20 mg total) by mouth daily. To lower blood pressure   loratadine 10 MG tablet Commonly known as:  CLARITIN Take 1 tablet (10 mg total) by mouth daily. As needed for itchy throat/allergy symptoms   metFORMIN 1000 MG tablet Commonly known as:  GLUCOPHAGE Take 1 tablet (1,000 mg total) by mouth 2 (two) times daily with a meal.   omeprazole 20 MG capsule Commonly known as:  PRILOSEC Take 1 capsule (20 mg total) by mouth daily. To reduce stomach acid        Follow-up: Return in about 4 weeks (around 01/23/2019) for DM/new medication.  Antony Blackbird, MD

## 2018-12-29 MED FILL — GLIMEPIRIDE 4 MG TABS: 4 | 30 days supply | Qty: 30 | Fill #1

## 2019-02-01 ENCOUNTER — Ambulatory Visit: Payer: Self-pay | Attending: Family Medicine | Admitting: Family Medicine

## 2019-02-01 ENCOUNTER — Encounter: Payer: Self-pay | Admitting: Family Medicine

## 2019-02-01 ENCOUNTER — Other Ambulatory Visit: Payer: Self-pay

## 2019-02-01 DIAGNOSIS — E1165 Type 2 diabetes mellitus with hyperglycemia: Secondary | ICD-10-CM

## 2019-02-01 DIAGNOSIS — N319 Neuromuscular dysfunction of bladder, unspecified: Secondary | ICD-10-CM

## 2019-02-01 DIAGNOSIS — G8220 Paraplegia, unspecified: Secondary | ICD-10-CM

## 2019-02-01 MED ORDER — BASAGLAR KWIKPEN 100 UNIT/ML ~~LOC~~ SOPN: PEN_INJECTOR | SUBCUTANEOUS | 11 refills | Status: DC

## 2019-02-01 MED ORDER — GLIMEPIRIDE 4 MG PO TABS: 4.0000 mg | ORAL_TABLET | Freq: Every day | ORAL | 4 refills | Status: DC

## 2019-02-01 MED FILL — ?BASAGLAR 100 UNITS/ML KWPE: 100 | 30 days supply | Qty: 3 | Fill #0

## 2019-02-01 MED FILL — GLIMEPIRIDE 4 MG TABS: 4 | 30 days supply | Qty: 30 | Fill #0

## 2019-02-01 NOTE — Progress Notes (Signed)
Virtual Visit via Telephone Note  I connected with John Meza on 02/01/19 at  9:50 AM EDT by telephone and verified that I am speaking with the correct person using two identifiers.   I discussed the limitations, risks, security and privacy concerns of performing an evaluation and management service by telephone and the availability of in person appointments. I also discussed with the patient that there may be a patient responsible charge related to this service. The patient expressed understanding and agreed to proceed.  Due to restrictions/limitations to in-office visits because of the COVID-19 pandemic, today's scheduled office visit was converted to a telehealth visit  Provider location: Patient location:  Call initiated by Guillermina [place], CMA who contacted [employer] due to a language barrier. 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  Patient has started long-acting insulin once daily.  He is using 6 units.  He states that his fasting blood sugars are now in the high 100s, around 180s.  He denies any increased thirst, no blurred vision.  Patient has noticed no increased odor and no dark color to the urine.  Patient has had no increase in mid back pain similar to prior urinary tract infection.  Patient continues to do self-catheterization due to neurogenic bladder.  He denies any active skin breakdown on the buttocks or other areas where his skin is in contact with pressure.  He states that he is feeling well at this time.  Past Medical History:  Diagnosis Date  . Cellulitis and abscess of buttock 10/2016  . Diabetes mellitus   . Hypertension   . Paraplegia (HCC) 2013   fell from ladder  . SCI (spinal cord injury)   . Spine fracture 11/16/2011   T 11- T9-L1   Past  Surgical History:  Procedure Laterality Date  . CHOLECYSTECTOMY N/A 05/01/2015   Procedure: LAPAROSCOPIC CHOLECYSTECTOMY WITH ATTEMPTED INTRAOPERATIVE CHOLANGIOGRAM;  Surgeon: Violeta Gelinas, MD;  Location: [facility] OR;  Service: General;  Laterality: N/A;  . SPINE SURGERY     Family History  Problem Relation Age of Onset  . Diabetes Father   . Diabetes Sister    Social History   Tobacco Use  . Smoking status: Current Every Day Smoker    Packs/day: 1.00    Years: 5.00    Pack years: 5.00    Types: Cigarettes    Last attempt to quit: 10/13/2011    Years since quitting: 7.3  . Smokeless tobacco: Never Used  Substance Use Topics  . Alcohol use: Yes    Alcohol/week: 1.0 standard drinks    Types: 1 Cans of beer per week  . Drug use: No   Allergies  Allergen Reactions  . Other Itching, Rash and Other (See Comments)    ANTIBIOTIC? CAUSED RASH, ITCHING IN 2013-PER FAMILY Possibly niacin causing flushing reaction- as described by family?   Review of Systems  Constitutional: Negative for chills and fever.  HENT: Negative for congestion and sore throat.   Eyes: Negative for blurred vision and double vision.  Respiratory: Negative for cough and shortness of breath.   Cardiovascular: Negative for chest pain and palpitations.  Gastrointestinal: Negative for abdominal pain, diarrhea and vomiting.  Musculoskeletal: Negative for back pain and joint pain.  Neurological: Negative for dizziness and headaches.  Endo/Heme/Allergies: Negative for polydipsia. Does not bruise/bleed easily.     Observations/Objective: No vital signs or physical exam done at today's visit as encounter was conducted via telephone  Assessment and Plan: 1. Uncontrolled type 2 diabetes mellitus with hyperglycemia (HCC) Patient's hemoglobin A1c in January was elevated at 11.4.  At his last visit, patient agreed to start insulin to help lower his blood sugars.  Patient states that he has been taking insulin at 6 units daily  and has had some mild improvement in his blood sugars.  Patient will increase his insulin dose to 10 units daily and patient will come into the office for lab visit in approximately 4 weeks.  He should call in the meantime if his fasting blood sugars are still staying greater than 140.  Patient is also provided with refill of Amaryl but this may be discontinued at some point as patient may not be receiving benefit from the medication.  He is also currently on metformin.  Patient will have BMP, hemoglobin A1c and urinalysis at his upcoming appointment for labs. - Insulin Glargine (BASAGLAR KWIKPEN) 100 UNIT/ML SOPN; Inject 10 units into the skin daily to lower blood sugar  Dispense: 15 mL; Refill: 11 - glimepiride (AMARYL) 4 MG tablet; Take 1 tablet (4 mg total) by mouth daily with breakfast. To lower blood sugar  Dispense: 90 tablet; Refill: 4 - Hemoglobin A1c; Future - Basic Metabolic Panel; Future - Urinalysis, dipstick only; Future  2. Paraplegia following spinal cord injury [facility](HCC) Patient with paraplegia and neurogenic bladder and has had some issues with recurrent UTI and patient was asked to bring a catheter in order to leave a urine sample for UA when he returns for upcoming lab visit - Urinalysis, dipstick only; Future  3. Neurogenic bladder Patient is at an increased risk for UTI due to neurogenic bladder and UA will be done at his upcoming visit urinalysis, dipstick only; Future  Follow Up Instructions:Return for DM-6 weeks for labs and 4 months office.    I discussed the assessment and treatment plan with the patient. The patient was provided an opportunity to ask questions and all were answered. The patient agreed with the plan and demonstrated an understanding of the instructions.   The patient was advised to call back or seek an in-person evaluation if the symptoms worsen or if the condition fails to improve as anticipated.  I provided 15 minutes of non-face-to-face time during  this encounter.   Cain Saupeammie Briona Korpela, MD

## 2019-02-01 NOTE — Progress Notes (Signed)
Med refill   DM follow up  Sugar this morning was 126  Sugar for yesterday morning was 192

## 2019-02-09 ENCOUNTER — Telehealth: Payer: Self-pay | Admitting: *Deleted

## 2019-02-09 ENCOUNTER — Telehealth: Payer: Self-pay | Admitting: Family Medicine

## 2019-02-09 MED ORDER — INSULIN PEN NEEDLE 32G X 4 MM MISC: 3 refills | Status: DC

## 2019-02-09 MED FILL — TRUEPLUS PEN NDL 32GX5/32: 32G X 4 MM | 30 days supply | Qty: 100 | Fill #0

## 2019-02-09 MED FILL — TRUEPLUS PEN NDL 32GX5/32": 32G X 4 MM | 30 days supply | Qty: 100 | Fill #0

## 2019-02-09 NOTE — Telephone Encounter (Signed)
Called patient and LMOM to call office to schedule lab visit due to provider wanting him to get labs per his phone visit on 02-01-19. [employer] [place] [phone] left the message.

## 2019-02-09 NOTE — Telephone Encounter (Signed)
1) Medication(s) Requested (by name): Insulin needles  2) Pharmacy of Choice: [email or web address] 3) Special Requests:   Approved medications will be sent to the pharmacy, we will reach out if there is an issue.  Requests made after 3pm may not be addressed until the following business day!  If a patient is unsure of the name of the medication(s) please note and ask patient to call back when they are able to provide all info, do not send to responsible party until all information is available!

## 2019-02-20 ENCOUNTER — Ambulatory Visit: Payer: Self-pay | Attending: Family Medicine

## 2019-02-20 ENCOUNTER — Other Ambulatory Visit: Payer: Self-pay

## 2019-02-20 DIAGNOSIS — E1165 Type 2 diabetes mellitus with hyperglycemia: Secondary | ICD-10-CM

## 2019-02-20 LAB — POCT URINALYSIS DIP (CLINITEK)
Bilirubin, UA: NEGATIVE
Blood, UA: NEGATIVE
Glucose, UA: 500 mg/dL — AB
Ketones, POC UA: NEGATIVE mg/dL
Leukocytes, UA: NEGATIVE
Nitrite, UA: NEGATIVE
POC PROTEIN,UA: NEGATIVE
Spec Grav, UA: 1.01
Urobilinogen, UA: 0.2 U/dL
pH, UA: 5.5

## 2019-02-20 NOTE — Addendum Note (Signed)
Addended by: Guillermina City A on: 02/20/2019 09:37 AM   Modules accepted: Orders

## 2019-02-20 NOTE — Addendum Note (Signed)
Addended by: Guillermina City A on: 02/20/2019 09:40 AM   Modules accepted: Orders

## 2019-02-21 LAB — BASIC METABOLIC PANEL WITH GFR
BUN/Creatinine Ratio: 20 (ref 9–20)
BUN: 15 mg/dL (ref 6–24)
CO2: 21 mmol/L (ref 20–29)
Calcium: 9.2 mg/dL (ref 8.7–10.2)
Chloride: 102 mmol/L (ref 96–106)
Creatinine, Ser: 0.74 mg/dL — ABNORMAL LOW (ref 0.76–1.27)
GFR calc Af Amer: 120 mL/min/1.73
GFR calc non Af Amer: 104 mL/min/1.73
Glucose: 134 mg/dL — ABNORMAL HIGH (ref 65–99)
Potassium: 4.6 mmol/L (ref 3.5–5.2)
Sodium: 138 mmol/L (ref 134–144)

## 2019-02-21 LAB — HEMOGLOBIN A1C
Est. average glucose Bld gHb Est-mCnc: 223 mg/dL
Hgb A1c MFr Bld: 9.4 % — ABNORMAL HIGH (ref 4.8–5.6)

## 2019-03-16 MED FILL — GLIMEPIRIDE 4 MG TABS: 4 | 30 days supply | Qty: 30 | Fill #1

## 2019-03-23 NOTE — Progress Notes (Signed)
DM follow up. Per pt his blood sugar at home been 130-145.   Per pt his blood sugar this morning was 140 at home  Med refills

## 2019-03-24 ENCOUNTER — Other Ambulatory Visit: Payer: Self-pay

## 2019-03-24 ENCOUNTER — Ambulatory Visit: Payer: Self-pay | Attending: Family Medicine | Admitting: Family Medicine

## 2019-03-24 ENCOUNTER — Encounter: Payer: Self-pay | Admitting: Family Medicine

## 2019-03-24 DIAGNOSIS — N319 Neuromuscular dysfunction of bladder, unspecified: Secondary | ICD-10-CM

## 2019-03-24 DIAGNOSIS — G8220 Paraplegia, unspecified: Secondary | ICD-10-CM

## 2019-03-24 DIAGNOSIS — E1165 Type 2 diabetes mellitus with hyperglycemia: Secondary | ICD-10-CM

## 2019-03-24 DIAGNOSIS — E785 Hyperlipidemia, unspecified: Secondary | ICD-10-CM

## 2019-03-24 DIAGNOSIS — I10 Essential (primary) hypertension: Secondary | ICD-10-CM

## 2019-03-24 DIAGNOSIS — E1169 Type 2 diabetes mellitus with other specified complication: Secondary | ICD-10-CM

## 2019-03-24 MED ORDER — TRUEPLUS PEN NEEDLES 32G X 4 MM MISC: 99 refills | Status: DC

## 2019-03-24 MED ORDER — BASAGLAR KWIKPEN 100 UNIT/ML ~~LOC~~ SOPN: PEN_INJECTOR | SUBCUTANEOUS | 11 refills | Status: DC

## 2019-03-24 MED ORDER — GLIMEPIRIDE 4 MG PO TABS: 4.0000 mg | ORAL_TABLET | Freq: Every day | ORAL | 4 refills | Status: DC

## 2019-03-24 MED ORDER — ATORVASTATIN CALCIUM 20 MG PO TABS: 20.0000 mg | ORAL_TABLET | Freq: Every day | ORAL | 3 refills | Status: DC

## 2019-03-24 MED ORDER — OMEPRAZOLE 20 MG PO CPDR: 20.0000 mg | DELAYED_RELEASE_CAPSULE | Freq: Every day | ORAL | 3 refills | Status: DC

## 2019-03-24 MED ORDER — METFORMIN HCL 1000 MG PO TABS: 1000.0000 mg | ORAL_TABLET | Freq: Two times a day (BID) | ORAL | 3 refills | Status: DC

## 2019-03-24 MED ORDER — LISINOPRIL 20 MG PO TABS: 20.0000 mg | ORAL_TABLET | Freq: Every day | ORAL | 3 refills | Status: DC

## 2019-03-24 MED FILL — LISINOPRIL 20 MG TABLET: 20 | 30 days supply | Qty: 30 | Fill #0

## 2019-03-24 MED FILL — TRUEPLUS PEN NDL 32GX5/32: 32G X 4 MM | 30 days supply | Qty: 100 | Fill #0

## 2019-03-24 MED FILL — ?ATORVASTATIN 20 MG TABLET: 20 | 30 days supply | Qty: 30 | Fill #0

## 2019-03-24 MED FILL — ?BASAGLAR 100 UNITS/ML KWPE: 100 | 30 days supply | Qty: 3 | Fill #0

## 2019-03-24 MED FILL — TRUEPLUS PEN NDL 32GX5/32": 32G X 4 MM | 30 days supply | Qty: 100 | Fill #0

## 2019-03-24 MED FILL — ?OMEPRAZOLE 20 MG CAPSULE D: 20 | 30 days supply | Qty: 30 | Fill #0

## 2019-03-24 MED FILL — metFORMIN HCL 1000 MG TABS: 1000 | 30 days supply | Qty: 60 | Fill #0

## 2019-03-24 NOTE — Progress Notes (Signed)
Virtual Visit via Telephone Note  I connected with John Meza on 03/24/19 at  9:50 AM EDT by telephone and verified that I am speaking with the correct person using two identifiers.   I discussed the limitations, risks, security and privacy concerns of performing an evaluation and management service by telephone and the availability of in person appointments. I also discussed with the patient that there may be a patient responsible charge related to this service. The patient expressed understanding and agreed to proceed.  Patient Location: Home Provider Location: Office Others participating in call: Emilio Aspen, [place] who obtained and documented Spanish interpreter through [facility] due to a language barrier   History of Present Illness:      58 year old male with paraplegia and wheelchair dependence secondary to accidental fall from a roof while at work in February 2013 who is seen in follow-up of chronic medical conditions including uncontrolled type 2 diabetes, hypertension and history of recurrent urinary tract infections due to neurogenic bladder related to his paraplegia.  He reports that he feels well.  Home blood sugars are improved and are now in the 140s to 150s fasting.  He has had no increased thirst and no blurred vision related to his blood sugars.  He denies any headaches or dizziness related to his blood pressure and he is taking his blood pressure medication daily.  He denies any fever, increased odor to the urine or cloudy color to the urine suggestive of urinary tract infection.  He has had no issues with diarrhea or nausea.  No shortness of breath or cough no issues with muscle or joint pain.  He denies any skin breakdown related to his use of a wheelchair/paraplegia.  Overall he feels that he is doing well.  Past Medical History:  Diagnosis Date  . Cellulitis and abscess of buttock 10/2016  . Diabetes mellitus   . Hypertension   .  Paraplegia ([place]) 2013   fell from ladder  . SCI (spinal cord injury)   . Spine fracture 11/16/2011   T 11- T9-L1    Past Surgical History:  Procedure Laterality Date  . CHOLECYSTECTOMY N/A 05/01/2015   Procedure: LAPAROSCOPIC CHOLECYSTECTOMY WITH ATTEMPTED INTRAOPERATIVE CHOLANGIOGRAM;  Surgeon: Georganna Skeans, MD;  Location: [facility];  Service: General;  Laterality: N/A;  . SPINE SURGERY      Family History  Problem Relation Age of Onset  . Diabetes Father   . Diabetes Sister     Social History   Tobacco Use  . Smoking status: Former Smoker    Packs/day: 1.00    Years: 5.00    Pack years: 5.00    Types: Cigarettes    Quit date: 10/13/2011    Years since quitting: 7.4  . Smokeless tobacco: Never Used  . Tobacco comment: 03-24-19 per pt he stopped 1 mo ago   Substance Use Topics  . Alcohol use: Yes    Alcohol/week: 1.0 standard drinks    Types: 1 Cans of beer per week  . Drug use: No     Allergies  Allergen Reactions  . Other Itching, Rash and Other (See Comments)    ANTIBIOTIC? CAUSED RASH, ITCHING IN 2013-PER FAMILY Possibly niacin causing flushing reaction- as described by family?       Observations/Objective: No vital signs or physical exam conducted as visit was done via telephone  Assessment and Plan: 1. Uncontrolled type 2 diabetes mellitus with hyperglycemia ([name]) Patient has had uncontrolled type 2 diabetes but he now reports  that blood sugars are now in the 140s to 150s which are improved.  #Patient is at increased risk of morbidity and mortality, today's visit was done by telephone due to the current COVID 19 pandemic.  Patient will have blood work done at his next visit which will be in approximately 4 months if it is safe for patient to come into the clinic at that time.  He is provided with refills of his current diabetes medications including Basaglar, Amaryl and Glucophage and will continue to monitor his blood sugars, remain compliant with medications and  continue a healthy diet. - Insulin Glargine (BASAGLAR KWIKPEN) 100 UNIT/ML SOPN; Inject 10 units into the skin daily to lower blood sugar  Dispense: 15 mL; Refill: 11 - glimepiride (AMARYL) 4 MG tablet; Take 1 tablet (4 mg total) by mouth daily with breakfast. To lower blood sugar  Dispense: 90 tablet; Refill: 4 - Insulin Pen Needle (TRUEPLUS PEN NEEDLES) 32G X 4 MM MISC; Use as directed to inject insulin  Dispense: 100 each; Refill: prn  2. Essential hypertension Patient will continue the use of Zestril for control of hypertension and renal protection from the effects of diabetes.  Continue a Dash type diet - lisinopril (ZESTRIL) 20 MG tablet; Take 1 tablet (20 mg total) by mouth daily. To lower blood pressure  Dispense: 90 tablet; Refill: 3  3. Neurogenic bladder Patient denies any signs or symptoms of possible urinary tract infection related to neurogenic bladder as a result of spinal cord injury with paraplegia  4. Paraplegia following spinal cord injury [facility](HCC) Patient reports no issues with skin breakdown related to his paraplegia and wheelchair use/dependence   5. Hyperlipidemia associated with type 2 diabetes mellitus (HCC) Continue use of atorvastatin as well as low-fat diet - atorvastatin (LIPITOR) 20 MG tablet; Take 1 tablet (20 mg total) by mouth daily. To lower cholesterol  Dispense: 90 tablet; Refill: 3  Follow Up Instructions:    I discussed the assessment and treatment plan with the patient. The patient was provided an opportunity to ask questions and all were answered. The patient agreed with the plan and demonstrated an understanding of the instructions.   The patient was advised to call back or seek an in-person evaluation if the symptoms worsen or if the condition fails to improve as anticipated.  I provided 8 minutes of non-face-to-face time during this encounter.   Cain Saupeammie Aviyon Hocevar, MD

## 2019-04-17 MED FILL — GLIMEPIRIDE 4 MG TABS: 4 | 30 days supply | Qty: 30 | Fill #0

## 2019-04-17 MED FILL — metFORMIN HCL 1000 MG TABS: 1000 | 30 days supply | Qty: 60 | Fill #1

## 2019-05-03 ENCOUNTER — Other Ambulatory Visit: Payer: Self-pay

## 2019-05-03 DIAGNOSIS — Z20822 Contact with and (suspected) exposure to covid-19: Secondary | ICD-10-CM

## 2019-05-04 MED FILL — ?BASAGLAR 100 UNITS/ML KWPE: 100 | 30 days supply | Qty: 3 | Fill #1

## 2019-05-07 LAB — NOVEL CORONAVIRUS, NAA: SARS-CoV-2, NAA: NOT DETECTED

## 2019-05-10 ENCOUNTER — Telehealth: Payer: Self-pay

## 2019-05-10 NOTE — Telephone Encounter (Signed)
Patient (Spouse) called requesting COVID19 test results - DOB verified - advised of results, no further questions.

## 2019-05-12 ENCOUNTER — Telehealth: Payer: Self-pay | Admitting: *Deleted

## 2019-05-12 NOTE — Telephone Encounter (Signed)
Patient verified DOB Patient is aware of COVID results being negative and no other concerns.

## 2019-05-12 NOTE — Telephone Encounter (Signed)
Patient calling for results.

## 2019-05-15 MED FILL — ?ATORVASTATIN 20 MG TABLET: 20 | 30 days supply | Qty: 30 | Fill #1

## 2019-05-15 MED FILL — LISINOPRIL 20 MG TABLET: 20 | 90 days supply | Qty: 90 | Fill #1

## 2019-05-15 MED FILL — GLIMEPIRIDE 4 MG TABS: 4 | 30 days supply | Qty: 30 | Fill #1

## 2019-05-15 MED FILL — metFORMIN HCL 1000 MG TABS: 1000 | 30 days supply | Qty: 60 | Fill #2

## 2019-05-15 MED FILL — ?OMEPRAZOLE 20 MG CAPSULE D: 20 | 90 days supply | Qty: 90 | Fill #1

## 2019-05-31 MED FILL — ?BASAGLAR 100 UNITS/ML KWPE: 100 | 30 days supply | Qty: 3 | Fill #2

## 2019-06-14 MED FILL — GLIMEPIRIDE 4 MG TABS: 4 | 30 days supply | Qty: 30 | Fill #2

## 2019-06-14 MED FILL — metFORMIN HCL 1000 MG TABS: 1000 | 30 days supply | Qty: 60 | Fill #3

## 2019-06-14 MED FILL — ?ATORVASTATIN 20 MG TABLET: 20 | 30 days supply | Qty: 30 | Fill #2

## 2019-07-04 MED FILL — ?BASAGLAR 100 UNITS/ML KWPE: 100 | 30 days supply | Qty: 3 | Fill #3

## 2019-07-17 MED FILL — GLIMEPIRIDE 4 MG TABS: 4 | 30 days supply | Qty: 30 | Fill #3

## 2019-07-17 MED FILL — LISINOPRIL 20 MG TABLET: 20 | 30 days supply | Qty: 30 | Fill #2

## 2019-07-17 MED FILL — ?ATORVASTATIN 20 MG TABLET: 20 | 30 days supply | Qty: 30 | Fill #3

## 2019-07-17 MED FILL — OMEPRAZOLE 20 MG CAP: 20 | 30 days supply | Qty: 30 | Fill #2

## 2019-07-17 MED FILL — ?metFORMIN HCL 1000MG TABL: 1000 | 30 days supply | Qty: 60 | Fill #4

## 2019-08-04 MED FILL — ?BASAGLAR 100 UNITS/ML KWPE: 100 | 30 days supply | Qty: 3 | Fill #4

## 2019-08-14 MED FILL — metFORMIN HCL 1000 MG TABS: 1000 | 30 days supply | Qty: 60 | Fill #5

## 2019-08-14 MED FILL — ?OMEPRAZOLE 20MG CAP DR: 20 | 30 days supply | Qty: 30 | Fill #3

## 2019-08-14 MED FILL — ?ATORVASTATIN 20 MG TABLET: 20 | 30 days supply | Qty: 30 | Fill #4

## 2019-08-14 MED FILL — GLIMEPIRIDE 4 MG TABS: 4 | 30 days supply | Qty: 30 | Fill #4

## 2019-08-14 MED FILL — LISINOPRIL 20 MG TABLET: 20 | 30 days supply | Qty: 30 | Fill #3

## 2019-08-30 ENCOUNTER — Other Ambulatory Visit: Payer: Self-pay

## 2019-08-30 ENCOUNTER — Ambulatory Visit: Payer: Self-pay | Attending: Family Medicine | Admitting: Family Medicine

## 2019-08-30 ENCOUNTER — Encounter: Payer: Self-pay | Admitting: Family Medicine

## 2019-08-30 DIAGNOSIS — E1165 Type 2 diabetes mellitus with hyperglycemia: Secondary | ICD-10-CM

## 2019-08-30 DIAGNOSIS — N319 Neuromuscular dysfunction of bladder, unspecified: Secondary | ICD-10-CM

## 2019-08-30 DIAGNOSIS — R369 Urethral discharge, unspecified: Secondary | ICD-10-CM

## 2019-08-30 DIAGNOSIS — N390 Urinary tract infection, site not specified: Secondary | ICD-10-CM

## 2019-08-30 DIAGNOSIS — R360 Urethral discharge without blood: Secondary | ICD-10-CM

## 2019-08-30 LAB — POCT URINALYSIS DIP (CLINITEK)
Bilirubin, UA: NEGATIVE
Glucose, UA: NEGATIVE mg/dL
Ketones, POC UA: NEGATIVE mg/dL
Nitrite, UA: POSITIVE — AB
POC PROTEIN,UA: 30 — AB
Spec Grav, UA: 1.015
Urobilinogen, UA: 0.2 U/dL
pH, UA: 5.5

## 2019-08-30 MED ORDER — AMOXICILLIN-POT CLAVULANATE 500-125 MG PO TABS: 1.0000 | ORAL_TABLET | Freq: Two times a day (BID) | ORAL | 0 refills | Status: DC

## 2019-08-30 MED ORDER — CEFTRIAXONE SODIUM 1 G IJ SOLR
1.0000 g | Freq: Once | INTRAMUSCULAR | Status: AC
  Administered 2019-08-30: 1 g via INTRAMUSCULAR

## 2019-08-30 MED ORDER — FLUCONAZOLE 150 MG PO TABS: 150.0000 mg | ORAL_TABLET | Freq: Once | ORAL | 0 refills | Status: AC

## 2019-08-30 MED FILL — TRUEPLUS PEN NDL 32GX5/32": 32G X 4 MM | 30 days supply | Qty: 100 | Fill #1

## 2019-08-30 MED FILL — AMOX-CLAV 500-125 MG TABLET: 500-125 | 7 days supply | Qty: 14 | Fill #0

## 2019-08-30 MED FILL — ?BASAGLAR 100 UNITS/ML KWPE: 100 | 30 days supply | Qty: 3 | Fill #5

## 2019-08-30 MED FILL — FLUCONAZOLE 150 MG TABLET: 150 | 3 days supply | Qty: 2 | Fill #0

## 2019-08-30 MED FILL — TRUEPLUS PEN NDL 32GX5/32: 32G X 4 MM | 30 days supply | Qty: 100 | Fill #1

## 2019-08-30 NOTE — Progress Notes (Signed)
Virtual Visit via Telephone Note  I connected with John Meza on 08/30/19 at  8:50 AM EST by telephone and verified that I am speaking with the correct person using two identifiers.   I discussed the limitations, risks, security and privacy concerns of performing an evaluation and management service by telephone and the availability of in person appointments. I also discussed with the patient that there may be a patient responsible charge related to this service. The patient expressed understanding and agreed to proceed.  Patient Location: Parking lot at [employer] Location: [facility] Office Others participating in call: call initiated by [place], [place] who obtained a Spanish speaking interpreter through [employer]   History of Present Illness:         58 year old Hispanic male with history of T11 fracture with subsequent paraplegia who per CMA stated that he had noticed whitish discharge from the penis.  When speaking with the patient and his wife today, patient has had a darker color to the urine, increased odor to the urine and occasional whitish, thicker discharge at the tip of the penis.  He has additionally had blood sugars that are staying in the high 100s to 200s.  He is taking his medications as prescribed.  He continues to have issues since his thoracic fracture with neurogenic bladder/incomplete bladder emptying.  He has had some increased thirst.  He has had some sensation of cramping beneath the ribs/lateral abdomen and mid back.  He denies any fever or chills, no episodes of diaphoresis.  No decreased appetite.  He has had some increase in fatigue.  He denies any chest pain or palpitations, no shortness of breath or cough.  No issues with active skin breakdown at this time.           Past Medical History:  Diagnosis Date  . Cellulitis and abscess of buttock 10/2016  . Diabetes mellitus   . Hypertension   . Paraplegia ([place]) 2013   fell from ladder    . SCI (spinal cord injury)   . Spine fracture 11/16/2011   T 11- T9-L1    Past Surgical History:  Procedure Laterality Date  . CHOLECYSTECTOMY N/A 05/01/2015   Procedure: LAPAROSCOPIC CHOLECYSTECTOMY WITH ATTEMPTED INTRAOPERATIVE CHOLANGIOGRAM;  Surgeon: Georganna Skeans, MD;  Location: [facility];  Service: General;  Laterality: N/A;  . SPINE SURGERY      Family History  Problem Relation Age of Onset  . Diabetes Father   . Diabetes Sister     Social History   Tobacco Use  . Smoking status: Former Smoker    Packs/day: 1.00    Years: 5.00    Pack years: 5.00    Types: Cigarettes    Quit date: 10/13/2011    Years since quitting: 7.8  . Smokeless tobacco: Never Used  . Tobacco comment: 03-24-19 per pt he stopped 1 mo ago   Substance Use Topics  . Alcohol use: Yes    Alcohol/week: 1.0 standard drinks    Types: 1 Cans of beer per week  . Drug use: No     Allergies  Allergen Reactions  . Other Itching, Rash and Other (See Comments)    ANTIBIOTIC? CAUSED RASH, ITCHING IN 2013-PER FAMILY Possibly niacin causing flushing reaction- as described by family?       Observations/Objective: No vital signs or physical exam conducted as visit was done via telephone  Assessment and Plan: 1. Discharge from penis Per CMA, patient with complaint of penile discharge.  This  is likely related to his diabetes but patient also with history of recurrent UTI due to neurogenic bladder from spinal injury.  Patient was asked to come into the office for lab visit for urinalysis. - POCT URINALYSIS DIP (CLINITEK)  2. Neurogenic bladder 3. Recurrent urinary tract infection/cystitis with hematuria Patient with abnormal urinalysis.  Urinalysis significant for moderate blood and positive nitrites and 3+ leukocytes he will be given 1 g Rocephin here in the office based on last urine culture results.  Today's urine sample will be sent for culture and patient will be notified if a change in antibiotic therapy is  needed based on the results and in the interim he will be placed on Augmentin 500 mg twice daily x7 days to take after meal.  Prescription also provided for Diflucan as he also likely has candidal infection related to his diabetes.  Will check CBC to make sure that patient does not have any significant leukocytosis as he may be at increased risk for pyelonephritis due to his neurogenic bladder. - CBC - cefTRIAXone (ROCEPHIN) injection 1 g - amoxicillin-clavulanate (AUGMENTIN) 500-125 MG tablet; Take 1 tablet (500 mg total) by mouth 2 (two) times daily.  Dispense: 14 tablet; Refill: 0 - fluconazole (DIFLUCAN) 150 MG tablet; Take 1 tablet (150 mg total) by mouth once for 1 dose. Then repeat in 3 days  Dispense: 2 tablet; Refill: 0  4. Uncontrolled type 2 diabetes mellitus with hyperglycemia (HCC) He has a history of diabetes which recently has been uncontrolled.  His elevation in his blood sugars may be related to his current infection.  He will have comprehensive metabolic panel, CBC and hemoglobin A1c at today's visit.  If his A1c is still greater than 7, he will be asked to make sooner appointment likely with the clinical pharmacist in follow-up of his diabetes and to improve therapy. - Comprehensive metabolic panel - CBC - Hemoglobin A1c  5.  Language barrier [place] interpretation system she is at today's visit to help with language barrier that could impair communication/healthcare  Follow Up Instructions:Return in about 4 months (around 12/28/2019) for DM FU.  1 to 2-week repeat urinalysis to make sure that infection has cleared.  If fever, abdominal pain, nausea or any significant concerns, please follow-up with the emergency department for further evaluation.    I discussed the assessment and treatment plan with the patient. The patient was provided an opportunity to ask questions and all were answered. The patient agreed with the plan and demonstrated an understanding of the instructions.     The patient was advised to call back or seek an in-person evaluation if the symptoms worsen or if the condition fails to improve as anticipated.  I provided 16 minutes of non-face-to-face time during this encounter.  Additional 12 minutes spent speaking with patient after lab for urinalysis which revealed the need for treatment with Rocephin due to urinary tract infection/cystitis.   Cain Saupe, MD

## 2019-08-30 NOTE — Patient Instructions (Signed)
LAB UA repeat in 2-3 weeks PCP FU in 4-64mths

## 2019-08-30 NOTE — Progress Notes (Signed)
Patient verified DOB Patient has not eaten Patient has taken medication today. Patient denies pain at this time. Patient denies chills at this time. Patient complains of white discharge around the head of his penis. Patient has an in and out catheter.

## 2019-08-31 LAB — HEMOGLOBIN A1C
Est. average glucose Bld gHb Est-mCnc: 229 mg/dL
Hgb A1c MFr Bld: 9.6 % — ABNORMAL HIGH (ref 4.8–5.6)

## 2019-08-31 LAB — CBC
Hematocrit: 40.7 % (ref 37.5–51.0)
Hemoglobin: 13.7 g/dL (ref 13.0–17.7)
MCH: 30.2 pg (ref 26.6–33.0)
MCHC: 33.7 g/dL (ref 31.5–35.7)
MCV: 90 fL (ref 79–97)
Platelets: 224 x10E3/uL (ref 150–450)
RBC: 4.54 x10E6/uL (ref 4.14–5.80)
RDW: 13 % (ref 11.6–15.4)
WBC: 6.9 x10E3/uL (ref 3.4–10.8)

## 2019-08-31 LAB — COMPREHENSIVE METABOLIC PANEL WITH GFR
ALT: 12 IU/L (ref 0–44)
AST: 10 IU/L (ref 0–40)
Albumin/Globulin Ratio: 1.5 (ref 1.2–2.2)
Albumin: 4.2 g/dL (ref 3.8–4.9)
Alkaline Phosphatase: 110 IU/L (ref 39–117)
BUN/Creatinine Ratio: 19 (ref 9–20)
BUN: 13 mg/dL (ref 6–24)
Bilirubin Total: 0.4 mg/dL (ref 0.0–1.2)
CO2: 22 mmol/L (ref 20–29)
Calcium: 9.6 mg/dL (ref 8.7–10.2)
Chloride: 102 mmol/L (ref 96–106)
Creatinine, Ser: 0.67 mg/dL — ABNORMAL LOW (ref 0.76–1.27)
GFR calc Af Amer: 124 mL/min/1.73
GFR calc non Af Amer: 107 mL/min/1.73
Globulin, Total: 2.8 g/dL (ref 1.5–4.5)
Glucose: 126 mg/dL — ABNORMAL HIGH (ref 65–99)
Potassium: 4.5 mmol/L (ref 3.5–5.2)
Sodium: 138 mmol/L (ref 134–144)
Total Protein: 7 g/dL (ref 6.0–8.5)

## 2019-09-01 ENCOUNTER — Other Ambulatory Visit: Payer: Self-pay

## 2019-09-01 ENCOUNTER — Ambulatory Visit: Payer: Self-pay

## 2019-09-13 ENCOUNTER — Other Ambulatory Visit: Payer: Self-pay

## 2019-09-14 MED FILL — LISINOPRIL 20 MG TABLET: 20 | 30 days supply | Qty: 30 | Fill #4

## 2019-09-14 MED FILL — ?ATORVASTATIN 20 MG TABLET: 20 | 30 days supply | Qty: 30 | Fill #5

## 2019-09-14 MED FILL — GLIMEPIRIDE 4 MG TABS: 4 | 30 days supply | Qty: 30 | Fill #5

## 2019-09-14 MED FILL — metFORMIN HCL 1000 MG TABS: 1000 | 30 days supply | Qty: 60 | Fill #6

## 2019-09-15 ENCOUNTER — Telehealth: Payer: Self-pay | Admitting: *Deleted

## 2019-09-15 ENCOUNTER — Ambulatory Visit: Payer: Self-pay | Attending: Family Medicine

## 2019-09-15 ENCOUNTER — Other Ambulatory Visit: Payer: Self-pay

## 2019-09-15 DIAGNOSIS — R369 Urethral discharge, unspecified: Secondary | ICD-10-CM

## 2019-09-15 NOTE — Telephone Encounter (Signed)
-----   Message from Antony Blackbird, MD sent at 09/08/2019  4:38 PM EST ----- On comprehensive metabolic panel, patient's glucose was 126 and otherwise normal electrolytes and liver enzymes. Normal complete blood count.  Hemoglobin A1c elevated at 9.6 which is improved from 11.410 months ago.  9.6 is still above goal of 7 or less.  Please make sure that you are following a healthy, low carbohydrate diet and monitor your blood sugars.  The goal for your fasting blood sugars is between 90-120 and to have your blood sugar back to around 140 or less within 2 hours of a meal.  If your blood sugars are remaining elevated, please contact the office and your medication may need to be adjusted.

## 2019-09-15 NOTE — Telephone Encounter (Signed)
Patient verified DOB Patient is aware of CBC,CMP and LFT being normal  Patient aware of A1C improving but not at goal. Patient has had 95 CBG's fasting at this time.  Aware of contacting the office if numbers are elevated for medication adjustment.

## 2019-09-15 NOTE — Progress Notes (Unsigned)
culture

## 2019-09-16 LAB — URINALYSIS
Bilirubin, UA: NEGATIVE
Glucose, UA: NEGATIVE
Ketones, UA: NEGATIVE
Nitrite, UA: NEGATIVE
Specific Gravity, UA: 1.014 (ref 1.005–1.030)
Urobilinogen, Ur: 0.2 mg/dL (ref 0.2–1.0)
pH, UA: 6.5 (ref 5.0–7.5)

## 2019-09-19 ENCOUNTER — Other Ambulatory Visit: Payer: Self-pay | Admitting: Family Medicine

## 2019-09-19 DIAGNOSIS — N390 Urinary tract infection, site not specified: Secondary | ICD-10-CM

## 2019-09-19 LAB — URINE CULTURE

## 2019-09-19 MED ORDER — LEVOFLOXACIN 500 MG PO TABS: 500.0000 mg | ORAL_TABLET | Freq: Every day | ORAL | 0 refills | Status: DC

## 2019-09-19 MED FILL — levoFLOXacin 500 MG TABS: 500 | 7 days supply | Qty: 7 | Fill #0

## 2019-09-19 NOTE — Progress Notes (Signed)
Patient ID: John Meza, male   DOB: 1961-03-06, 58 y.o.   MRN: [id number]   Urine culture shows continued urinary tract infection.  Patient will be notified that prescription has been sent to his pharmacy for Levaquin 500 mg daily x7 days and that he should have repeat urine culture in approximately 1 week after completing antibiotic therapy

## 2019-09-27 ENCOUNTER — Other Ambulatory Visit: Payer: Self-pay | Admitting: Family Medicine

## 2019-09-27 ENCOUNTER — Other Ambulatory Visit: Payer: Self-pay

## 2019-09-27 ENCOUNTER — Ambulatory Visit: Payer: Self-pay | Attending: Family Medicine

## 2019-09-27 DIAGNOSIS — N390 Urinary tract infection, site not specified: Secondary | ICD-10-CM

## 2019-09-27 DIAGNOSIS — N319 Neuromuscular dysfunction of bladder, unspecified: Secondary | ICD-10-CM

## 2019-09-27 MED FILL — ?BASAGLAR 100 UNITS/ML KWPE: 100 | 30 days supply | Qty: 3 | Fill #6

## 2019-09-27 NOTE — Progress Notes (Signed)
Patient ID: John Meza, male   DOB: 1961/05/02, 58 y.o.   MRN: [id number]   Patient with chronic signs of infection on urinalysis and recurrent abnormal urine culture.  Patient with neurogenic bladder status post spinal injury.  Stat referral will be placed to urology

## 2019-09-27 NOTE — Progress Notes (Signed)
Patient has had continued/chronic urinary tract infection.  Please see if urine can be sent for culture.  Stat referral will be made for urology follow-up.  Please see if patient completed recently prescribed Levaquin.

## 2019-09-28 NOTE — Progress Notes (Signed)
Patient picked up medication same day and completed the course. Patient is aware of referral being placed and awaiting a phone call for scheduling

## 2019-09-29 LAB — URINE CULTURE: Organism ID, Bacteria: NO GROWTH

## 2019-10-11 MED FILL — metFORMIN HCL 1000 MG TABS: 1000 | 30 days supply | Qty: 60 | Fill #7

## 2019-10-11 MED FILL — ?ATORVASTATIN 20 MG TABLET: 20 | 30 days supply | Qty: 30 | Fill #6

## 2019-10-11 MED FILL — GLIMEPIRIDE 4 MG TABS: 4 | 30 days supply | Qty: 30 | Fill #6

## 2019-10-11 MED FILL — LISINOPRIL 20 MG TABLET: 20 | 30 days supply | Qty: 30 | Fill #5

## 2019-10-17 ENCOUNTER — Encounter: Payer: Self-pay | Admitting: Family Medicine

## 2019-11-03 MED FILL — ?BASAGLAR 100 UNITS/ML KWPE: 100 | 30 days supply | Qty: 3 | Fill #1

## 2019-11-10 MED FILL — metFORMIN HCL 1000 MG TABS: 1000 | 30 days supply | Qty: 60 | Fill #8

## 2019-11-10 MED FILL — GLIMEPIRIDE 4 MG TABS: 4 | 30 days supply | Qty: 30 | Fill #7

## 2019-11-10 MED FILL — ?OMEPRAZOLE 20MG CAP DR: 20 | 30 days supply | Qty: 30 | Fill #4

## 2019-11-10 MED FILL — LISINOPRIL 20 MG TABLET: 20 | 30 days supply | Qty: 30 | Fill #6

## 2019-11-10 MED FILL — ?ATORVASTATIN 20 MG TABLET: 20 | 30 days supply | Qty: 30 | Fill #7

## 2019-11-27 ENCOUNTER — Telehealth: Payer: Self-pay | Admitting: Family Medicine

## 2019-11-27 NOTE — Telephone Encounter (Signed)
Patients Wife called in regards to Urology referral, she says the location we referred pt does not accept the OC or CAFA. Please follow up

## 2019-11-28 NOTE — Telephone Encounter (Signed)
I spoke to patient's wife and she said that they see him  and it was cover through the orange card  .   Thank you .

## 2019-12-01 MED FILL — ?BASAGLAR 100 UNITS/ML KWPE: 100 | 30 days supply | Qty: 3 | Fill #2

## 2019-12-02 ENCOUNTER — Encounter (HOSPITAL_COMMUNITY): Payer: Self-pay

## 2019-12-02 ENCOUNTER — Other Ambulatory Visit: Payer: Self-pay

## 2019-12-02 ENCOUNTER — Ambulatory Visit (HOSPITAL_COMMUNITY)
Admission: EM | Admit: 2019-12-02 | Discharge: 2019-12-02 | Disposition: A | Discharge status: Home / Self Care | Payer: Self-pay | Attending: Emergency Medicine | Admitting: Emergency Medicine

## 2019-12-02 DIAGNOSIS — N319 Neuromuscular dysfunction of bladder, unspecified: Secondary | ICD-10-CM | POA: Insufficient documentation

## 2019-12-02 DIAGNOSIS — E1165 Type 2 diabetes mellitus with hyperglycemia: Secondary | ICD-10-CM | POA: Insufficient documentation

## 2019-12-02 DIAGNOSIS — R519 Headache, unspecified: Secondary | ICD-10-CM | POA: Insufficient documentation

## 2019-12-02 DIAGNOSIS — G8220 Paraplegia, unspecified: Secondary | ICD-10-CM | POA: Insufficient documentation

## 2019-12-02 DIAGNOSIS — Z20822 Contact with and (suspected) exposure to covid-19: Secondary | ICD-10-CM | POA: Insufficient documentation

## 2019-12-02 DIAGNOSIS — Z993 Dependence on wheelchair: Secondary | ICD-10-CM | POA: Insufficient documentation

## 2019-12-02 DIAGNOSIS — Z87891 Personal history of nicotine dependence: Secondary | ICD-10-CM | POA: Insufficient documentation

## 2019-12-02 DIAGNOSIS — Z794 Long term (current) use of insulin: Secondary | ICD-10-CM | POA: Insufficient documentation

## 2019-12-02 DIAGNOSIS — Z7982 Long term (current) use of aspirin: Secondary | ICD-10-CM | POA: Insufficient documentation

## 2019-12-02 DIAGNOSIS — E871 Hypo-osmolality and hyponatremia: Secondary | ICD-10-CM | POA: Insufficient documentation

## 2019-12-02 DIAGNOSIS — Z87440 Personal history of urinary (tract) infections: Secondary | ICD-10-CM | POA: Insufficient documentation

## 2019-12-02 DIAGNOSIS — E785 Hyperlipidemia, unspecified: Secondary | ICD-10-CM | POA: Insufficient documentation

## 2019-12-02 DIAGNOSIS — R509 Fever, unspecified: Secondary | ICD-10-CM | POA: Insufficient documentation

## 2019-12-02 DIAGNOSIS — E876 Hypokalemia: Secondary | ICD-10-CM | POA: Insufficient documentation

## 2019-12-02 DIAGNOSIS — N390 Urinary tract infection, site not specified: Secondary | ICD-10-CM | POA: Insufficient documentation

## 2019-12-02 DIAGNOSIS — I10 Essential (primary) hypertension: Secondary | ICD-10-CM | POA: Insufficient documentation

## 2019-12-02 DIAGNOSIS — Z79899 Other long term (current) drug therapy: Secondary | ICD-10-CM | POA: Insufficient documentation

## 2019-12-02 DIAGNOSIS — E669 Obesity, unspecified: Secondary | ICD-10-CM | POA: Insufficient documentation

## 2019-12-02 DIAGNOSIS — K592 Neurogenic bowel, not elsewhere classified: Secondary | ICD-10-CM | POA: Insufficient documentation

## 2019-12-02 LAB — POCT URINALYSIS DIP (DEVICE)
Bilirubin Urine: NEGATIVE
Glucose, UA: 500 mg/dL — AB
Ketones, ur: NEGATIVE mg/dL
Nitrite: NEGATIVE
Protein, ur: >=300 mg/dL — AB
Specific Gravity, Urine: 1.02 (ref 1.005–1.030)
Urobilinogen, UA: 0.2 mg/dL (ref 0.0–1.0)
pH: 6 (ref 5.0–8.0)

## 2019-12-02 MED ORDER — CIPROFLOXACIN HCL 500 MG PO TABS: 500.0000 mg | ORAL_TABLET | Freq: Two times a day (BID) | ORAL | 0 refills | Status: AC

## 2019-12-02 MED ORDER — CEFTRIAXONE SODIUM 1 G IJ SOLR
1.0000 g | Freq: Once | INTRAMUSCULAR | Status: AC
  Administered 2019-12-02: 17:00:00 1 g via INTRAMUSCULAR

## 2019-12-02 MED ORDER — CEFTRIAXONE SODIUM 1 G IJ SOLR
INTRAMUSCULAR | Status: AC
  Filled 2019-12-02: qty 10

## 2019-12-02 MED ORDER — LIDOCAINE HCL (PF) 1 % IJ SOLN
INTRAMUSCULAR | Status: AC
  Filled 2019-12-02: qty 2

## 2019-12-02 NOTE — ED Triage Notes (Signed)
Pt c/o HA and feeling feverish since yesterday. Did not take temperature. States "I think I have a urinary tract infection, because this is how I feel when I get one." States sore throat of undetermined origin, but "took a pill from my other doctor and it went away." States noticed a "discharge" from his penis after urinating today.  Denies abd pain, n/v. Paralyzed/parasthesia from waist down.  States usually has to be self-catheterized for urine sample/testing.

## 2019-12-02 NOTE — ED Provider Notes (Signed)
[facility]    CSN: [id number] Arrival date & time: 12/02/19  1515      History   Chief Complaint Chief Complaint  Patient presents with  . Fever  . Headache    HPI John Meza is a 59 y.o. male.   John Meza presents with his wife with complaints of headache, fever, dark colored urine, which started yesterday. Symptoms started yesterday. He feels similar to UTI's he has had in the past. 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 Neurogenic bladder, has followed with urology, next appointment next month. History of UTI's. Typically incontinent of urine but does self cath prn as well. Described his urine as "Chocolate" colored. Sore throat yesterday which has improved. No new back pain, no abdominal pain. No vomiting or diarrhea. No medications for symptoms. No known ill contacts.   Spanish video interpreter used to collect history and physical exam.    ROS per HPI, negative if not otherwise mentioned.      Past Medical History:  Diagnosis Date  . Cellulitis and abscess of buttock 10/2016  . Diabetes mellitus   . Hypertension   . Paraplegia (HCC) 2013   fell from ladder  . SCI (spinal cord injury)   . Spine fracture 11/16/2011   T 11- T9-L1    Patient Active Problem List   Diagnosis Date Noted  . Pressure injury of skin 11/04/2016  . Cellulitis of right buttock 11/03/2016  . Uncontrolled type 2 diabetes mellitus with hyperglycemia (HCC) 11/03/2016  . AP (abdominal pain)   . Sinus tachycardia   . Blood poisoning   . Diabetes type 2, uncontrolled (HCC)   . Hyponatremia   . Essential hypertension   . Choledocholithiasis   . Hypomagnesemia   . Hypokalemia   . Epigastric pain   . Acute cholecystitis   . Biliary tract imaging abnormality   . Obstructive jaundice 04/30/2015  . Sepsis (HCC) 04/30/2015  . Cholecystitis 04/30/2015  . Rotator cuff tear, left 08/12/2012  . Neurogenic bladder 01/06/2012  .  Neurogenic bowel 01/06/2012  . SCI (spinal cord injury) 11/20/2011  . 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 Concussion with brief LOC 11/16/2011  . Accidental fall from ladder 11/16/2011  . DM, UNCOMPLICATED, TYPE II, UNCONTROLLED 05/26/2007  . OBESITY, MODERATE 05/26/2007  . HLD (hyperlipidemia) 05/25/2007  . DYSPEPSIA 05/25/2007    Past Surgical History:  Procedure Laterality Date  . CHOLECYSTECTOMY N/A 05/01/2015   Procedure: LAPAROSCOPIC CHOLECYSTECTOMY WITH ATTEMPTED INTRAOPERATIVE CHOLANGIOGRAM;  Surgeon: Violeta Gelinas, MD;  Location: [facility];  Service: General;  Laterality: N/A;  . SPINE SURGERY         Home Medications    Prior to Admission medications   Medication Sig Start Date End Date Taking? Authorizing Provider  aspirin EC 81 MG tablet Take 1 tablet (81 mg total) by mouth daily. Take after eating 10/31/18   Fulp, Cammie, MD  atorvastatin (LIPITOR) 20 MG tablet Take 1 tablet (20 mg total) by mouth daily. To lower cholesterol 03/24/19   Fulp, Cammie, MD  ciprofloxacin (CIPRO) 500 MG tablet Take 1 tablet (500 mg total) by mouth every 12 (twelve) hours for 7 days. 12/02/19 12/09/19  Georgetta Haber, NP  glimepiride (AMARYL) 4 MG tablet Take 1 tablet (4 mg total) by mouth daily with breakfast. To lower blood sugar 03/24/19   Fulp, Cammie, MD  Insulin Glargine (BASAGLAR KWIKPEN) 100 UNIT/ML SOPN Inject 10 units into the skin daily to lower blood sugar 03/24/19  Fulp, Cammie, MD  Insulin Pen Needle (TRUEPLUS PEN NEEDLES) 32G X 4 MM MISC Use as directed to inject insulin 03/24/19   Fulp, Cammie, MD  levofloxacin (LEVAQUIN) 500 MG tablet Take 1 tablet (500 mg total) by mouth daily. 09/19/19   Fulp, Cammie, MD  lisinopril (ZESTRIL) 20 MG tablet Take 1 tablet (20 mg total) by mouth daily. To lower blood pressure 03/24/19   Fulp, Cammie, MD  loratadine (CLARITIN) 10 MG tablet Take 1 tablet (10 mg total) by mouth daily. As needed for itchy throat/allergy symptoms Patient not taking: Reported on  03/24/2019 12/26/18   Antony Blackbird, MD  metFORMIN (GLUCOPHAGE) 1000 MG tablet Take 1 tablet (1,000 mg total) by mouth 2 (two) times daily with a meal. 03/24/19   Fulp, Cammie, MD  omeprazole (PRILOSEC) 20 MG capsule Take 1 capsule (20 mg total) by mouth daily. To reduce stomach acid 03/24/19   Fulp, Ander Gaster, MD    Family History Family History  Problem Relation Age of Onset  . Diabetes Father   . Diabetes Sister     Social History Social History   Tobacco Use  . Smoking status: Former Smoker    Packs/day: 1.00    Years: 5.00    Pack years: 5.00    Types: Cigarettes    Quit date: 10/13/2011    Years since quitting: 8.1  . Smokeless tobacco: Never Used  . Tobacco comment: 03-24-19 per pt he stopped 1 mo ago   Substance Use Topics  . Alcohol use: Yes    Alcohol/week: 1.0 standard drinks    Types: 1 Cans of beer per week  . Drug use: No     Allergies   Other   Review of Systems Review of Systems   Physical Exam Triage Vital Signs ED Triage Vitals  Enc Vitals Group     BP 12/02/19 1605 (!) 151/70     Pulse Rate 12/02/19 1605 (!) 106     Resp 12/02/19 1605 18     Temp 12/02/19 1605 99.9 F (37.7 C)     Temp Source 12/02/19 1605 Oral     SpO2 12/02/19 1605 100 %     Weight --      Height --      Head Circumference --      Peak Flow --      Pain Score 12/02/19 1556 5     Pain Loc --      Pain Edu? --      Excl. in [place]? --    No data found.  Updated Vital Signs BP (!) 151/70 (BP Location: Right Arm)   Pulse (!) 106   Temp 99.9 F (37.7 C) (Oral)   Resp 18   SpO2 100%   Visual Acuity Right Eye Distance:   Left Eye Distance:   Bilateral Distance:    Right Eye Near:   Left Eye Near:    Bilateral Near:     Physical Exam Constitutional:      Appearance: He is well-developed.  Cardiovascular:     Rate and Rhythm: Tachycardia present.  Pulmonary:     Effort: Pulmonary effort is normal.  Abdominal:     Tenderness: There is no abdominal tenderness. There  is no right CVA tenderness or left CVA tenderness.  Skin:    General: Skin is warm and dry.  Neurological:     Mental Status: He is alert and oriented to person, place, and time.      UC Treatments /  Results  Labs (all labs ordered are listed, but only abnormal results are displayed) Labs Reviewed  POCT URINALYSIS DIP (DEVICE) - Abnormal; Notable for the following components:      Result Value   Glucose, UA 500 (*)    Hgb urine dipstick MODERATE (*)    Protein, ur >=300 (*)    Leukocytes,Ua LARGE (*)    All other components within normal limits  URINE CULTURE  NOVEL CORONAVIRUS, NAA (HOSP ORDER, SEND-OUT TO REF LAB; TAT 18-24 HRS)    EKG   Radiology No results found.  Procedures Procedures (including critical care time)  Medications Ordered in UC Medications  cefTRIAXone (ROCEPHIN) injection 1 g (has no administration in time range)    Initial Impression / Assessment and Plan / UC Course  I have reviewed the triage vital signs and the nursing notes.  Pertinent labs & imaging results that were available during my care of the patient were reviewed by me and considered in my medical decision making (see chart for details).     Self cathed for sample today with new catheter. Noted large leuks, with tachycardia and symptoms feeling consistent with UTI opted to provide coverage for this with iM rocephin as well as cipro, sensitive per last culture. Repeat culture pending today as well. covid also collected and pending to ensure this isn't source of headache and fevers. Encouraged close follow up with PCP and/or urology for recheck. Return precautions provided. Patient verbalized understanding and agreeable to plan.   Final Clinical Impressions(s) / UC Diagnoses   Final diagnoses:  Recurrent urinary tract infection     Discharge Instructions     drink plenty of water to maintain adequate hydration.  tylenol or ibuprofen as needed for fevers.  complete course of  antibiotics as provided.  we will call you if your culture indicates need to change your antibiotic.  please follow up with your primary care provider and/or your urologist for recheck in the next two weeks.  if worsening of symptoms, pain, fevers, unable to urinate, weakness, please go to the ER.  we will call you if your covid test returns positive, negatives will be sent through [facility].    Beba mucha agua para mantener una hidratacin adecuada. tylenol o ibuprofeno segn sea necesario para la fiebre. ciclo completo de antibiticos segn lo previsto. lo llamaremos si su cultivo indica la necesidad de cambiar su antibitico. por favor, haga un seguimiento con su proveedor de atencin primaria y / o su urlogo para volver a Occupational hygienist en las [employer]. Si los sntomas empeoran, [place], [place], incapacidad para [place], debilidad, vaya a la sala de emergencias. lo llamaremos si su prueba de covid arroja un resultado positivo, los negativos se enviarn a travs de [employer].   ED Prescriptions    Medication Sig Dispense Auth. Provider   ciprofloxacin (CIPRO) 500 MG tablet Take 1 tablet (500 mg total) by mouth every 12 (twelve) hours for 7 days. 14 tablet Georgetta Haber, NP     PDMP not reviewed this encounter.   Georgetta Haber, NP 12/02/19 [phone]

## 2019-12-02 NOTE — Discharge Instructions (Signed)
drink plenty of water to maintain adequate hydration.  tylenol or ibuprofen as needed for fevers.  complete course of antibiotics as provided.  we will call you if your culture indicates need to change your antibiotic.  please follow up with your primary care provider and/or your urologist for recheck in the next two weeks.  if worsening of symptoms, pain, fevers, unable to urinate, weakness, please go to the ER.  we will call you if your covid test returns positive, negatives will be sent through [facility].    Beba mucha agua para mantener una hidratacin adecuada. tylenol o ibuprofeno segn sea necesario para la fiebre. ciclo completo de antibiticos segn lo previsto. lo llamaremos si su cultivo indica la necesidad de cambiar su antibitico. por favor, haga un seguimiento con su proveedor de atencin primaria y / o su urlogo para volver a Occupational hygienist en las [employer]. Si los sntomas empeoran, [place], incapacidad para [place], debilidad, vaya a la sala de emergencias. lo llamaremos si su prueba de covid arroja un resultado positivo, los negativos se enviarn a travs de [employer].

## 2019-12-03 LAB — NOVEL CORONAVIRUS, NAA (HOSP ORDER, SEND-OUT TO REF LAB; TAT 18-24 HRS): SARS-CoV-2, NAA: NOT DETECTED

## 2019-12-05 ENCOUNTER — Telehealth (HOSPITAL_COMMUNITY): Payer: Self-pay | Admitting: Emergency Medicine

## 2019-12-05 LAB — URINE CULTURE: Culture: >=100000 — AB

## 2019-12-05 MED ORDER — NITROFURANTOIN MONOHYD MACRO 100 MG PO CAPS: 100.0000 mg | ORAL_CAPSULE | Freq: Two times a day (BID) | ORAL | 0 refills | Status: AC

## 2019-12-05 NOTE — Telephone Encounter (Signed)
Per Kaser PA, send macrobid bid x10 days. With spanish interpreter, Patient contacted by phone and made aware of    results. Pt verbalized understanding and had all questions answered.

## 2019-12-11 ENCOUNTER — Other Ambulatory Visit: Payer: Self-pay

## 2019-12-11 ENCOUNTER — Ambulatory Visit (HOSPITAL_COMMUNITY)
Admission: EM | Admit: 2019-12-11 | Discharge: 2019-12-11 | Disposition: A | Discharge status: Home / Self Care | Payer: Self-pay | Attending: Family Medicine | Admitting: Family Medicine

## 2019-12-11 ENCOUNTER — Encounter (HOSPITAL_COMMUNITY): Payer: Self-pay

## 2019-12-11 DIAGNOSIS — B9620 Unspecified Escherichia coli [E. coli] as the cause of diseases classified elsewhere: Secondary | ICD-10-CM

## 2019-12-11 DIAGNOSIS — N390 Urinary tract infection, site not specified: Secondary | ICD-10-CM

## 2019-12-11 DIAGNOSIS — N319 Neuromuscular dysfunction of bladder, unspecified: Secondary | ICD-10-CM

## 2019-12-11 DIAGNOSIS — T7840XA Allergy, unspecified, initial encounter: Secondary | ICD-10-CM

## 2019-12-11 DIAGNOSIS — L509 Urticaria, unspecified: Secondary | ICD-10-CM

## 2019-12-11 DIAGNOSIS — R21 Rash and other nonspecific skin eruption: Secondary | ICD-10-CM

## 2019-12-11 MED ORDER — KETOROLAC TROMETHAMINE 30 MG/ML IJ SOLN
30.0000 mg | Freq: Once | INTRAMUSCULAR | Status: AC
  Administered 2019-12-11: 30 mg via INTRAMUSCULAR

## 2019-12-11 MED ORDER — KETOROLAC TROMETHAMINE 30 MG/ML IJ SOLN
INTRAMUSCULAR | Status: AC
  Filled 2019-12-11: qty 1

## 2019-12-11 NOTE — Discharge Instructions (Addendum)
Follow up with appt with [facility] at 12:45pm on 12/12/19.  If having rash and itching, may use Benadryl tonight.  Go to the ED for shortness of breath, high fever, severe diarrhea, other concerning symptoms.

## 2019-12-11 NOTE — ED Triage Notes (Signed)
Pt presents with concern to allergic reaction to antibiotics. The patient was dx with a uti last week and was given cipro. After the culture resulted he was placed on macrobid on 12/05/19. Pr presents with complaints of allergy like symptoms such as itching all over.

## 2019-12-11 NOTE — ED Provider Notes (Signed)
[facility]    CSN: [id number] Arrival date & time: 12/11/19  1346      History   Chief Complaint Chief Complaint  Patient presents with  . Rash    HPI John Meza is a 59 y.o. male.   Reports today for rash that has been there for that last 3 days. He reports itching as well. Reports that this began the day after he began Macrobid for UTI. Reports that he does have a urologist, but does not have an appt set up with them as of now. Reports that urine is brown in color, and this is how he usually knows that he has a UTI. Denies fever, chill, n/v/d, HA, ST, SOB, abdominal pain, other symptoms.  ROS per HPI  The history is provided by the patient.    Past Medical History:  Diagnosis Date  . Cellulitis and abscess of buttock 10/2016  . Diabetes mellitus   . Hypertension   . Paraplegia (HCC) 2013   fell from ladder  . SCI (spinal cord injury)   . Spine fracture 11/16/2011   T 11- T9-L1    Patient Active Problem List   Diagnosis Date Noted  . Pressure injury of skin 11/04/2016  . Cellulitis of right buttock 11/03/2016  . Uncontrolled type 2 diabetes mellitus with hyperglycemia (HCC) 11/03/2016  . AP (abdominal pain)   . Sinus tachycardia   . Blood poisoning   . Diabetes type 2, uncontrolled (HCC)   . Hyponatremia   . Essential hypertension   . Choledocholithiasis   . Hypomagnesemia   . Hypokalemia   . Epigastric pain   . Acute cholecystitis   . Biliary tract imaging abnormality   . Obstructive jaundice 04/30/2015  . Sepsis (HCC) 04/30/2015  . Cholecystitis 04/30/2015  . Rotator cuff tear, left 08/12/2012  . Neurogenic bladder 01/06/2012  . Neurogenic bowel 01/06/2012  . SCI (spinal cord injury) 11/20/2011  . 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 Concussion with brief LOC 11/16/2011  . Accidental fall from ladder 11/16/2011  . DM, UNCOMPLICATED, TYPE II, UNCONTROLLED 05/26/2007  . OBESITY, MODERATE 05/26/2007  . HLD (hyperlipidemia) 05/25/2007   . DYSPEPSIA 05/25/2007    Past Surgical History:  Procedure Laterality Date  . CHOLECYSTECTOMY N/A 05/01/2015   Procedure: LAPAROSCOPIC CHOLECYSTECTOMY WITH ATTEMPTED INTRAOPERATIVE CHOLANGIOGRAM;  Surgeon: Violeta Gelinas, MD;  Location: [facility];  Service: General;  Laterality: N/A;  . SPINE SURGERY         Home Medications    Prior to Admission medications   Medication Sig Start Date End Date Taking? Authorizing Provider  aspirin EC 81 MG tablet Take 1 tablet (81 mg total) by mouth daily. Take after eating 10/31/18   Fulp, Cammie, MD  atorvastatin (LIPITOR) 20 MG tablet Take 1 tablet (20 mg total) by mouth daily. To lower cholesterol 03/24/19   Fulp, Cammie, MD  glimepiride (AMARYL) 4 MG tablet Take 1 tablet (4 mg total) by mouth daily with breakfast. To lower blood sugar 03/24/19   Fulp, Cammie, MD  Insulin Glargine (BASAGLAR KWIKPEN) 100 UNIT/ML SOPN Inject 10 units into the skin daily to lower blood sugar 03/24/19   Fulp, Cammie, MD  Insulin Pen Needle (TRUEPLUS PEN NEEDLES) 32G X 4 MM MISC Use as directed to inject insulin 03/24/19   Fulp, Cammie, MD  levofloxacin (LEVAQUIN) 500 MG tablet Take 1 tablet (500 mg total) by mouth daily. 09/19/19   Fulp, Cammie, MD  lisinopril (ZESTRIL) 20 MG tablet Take 1 tablet (20 mg total)  by mouth daily. To lower blood pressure 03/24/19   Fulp, Cammie, MD  loratadine (CLARITIN) 10 MG tablet Take 1 tablet (10 mg total) by mouth daily. As needed for itchy throat/allergy symptoms Patient not taking: Reported on 03/24/2019 12/26/18   Cain Saupe, MD  metFORMIN (GLUCOPHAGE) 1000 MG tablet Take 1 tablet (1,000 mg total) by mouth 2 (two) times daily with a meal. 03/24/19   Fulp, Cammie, MD  nitrofurantoin, macrocrystal-monohydrate, (MACROBID) 100 MG capsule Take 1 capsule (100 mg total) by mouth 2 (two) times daily for 10 days. 12/05/19 12/15/19  Wallis Bamberg, PA-C  omeprazole (PRILOSEC) 20 MG capsule Take 1 capsule (20 mg total) by mouth daily. To reduce stomach acid  03/24/19   Fulp, Hewitt Shorts, MD    Family History Family History  Problem Relation Age of Onset  . Diabetes Father   . Diabetes Sister     Social History Social History   Tobacco Use  . Smoking status: Former Smoker    Packs/day: 1.00    Years: 5.00    Pack years: 5.00    Types: Cigarettes    Quit date: 10/13/2011    Years since quitting: 8.1  . Smokeless tobacco: Never Used  . Tobacco comment: 03-24-19 per pt he stopped 1 mo ago   Substance Use Topics  . Alcohol use: Yes    Alcohol/week: 1.0 standard drinks    Types: 1 Cans of beer per week  . Drug use: No     Allergies   Macrobid [nitrofurantoin] and Other   Review of Systems Review of Systems   Physical Exam Triage Vital Signs ED Triage Vitals  Enc Vitals Group     BP 12/11/19 1417 (!) 142/87     Pulse Rate 12/11/19 1417 85     Resp 12/11/19 1417 17     Temp 12/11/19 1417 98.2 F (36.8 C)     Temp src --      SpO2 12/11/19 1417 100 %     Weight --      Height --      Head Circumference --      Peak Flow --      Pain Score 12/11/19 1420 0     Pain Loc --      Pain Edu? --      Excl. in [place]? --    No data found.  Updated Vital Signs BP (!) 142/87   Pulse 85   Temp 98.2 F (36.8 C)   Resp 17   SpO2 100%   Visual Acuity Right Eye Distance:   Left Eye Distance:   Bilateral Distance:    Right Eye Near:   Left Eye Near:    Bilateral Near:     Physical Exam Vitals and nursing note reviewed.  Constitutional:      General: He is not in acute distress.    Appearance: Normal appearance. He is well-developed and normal weight.  HENT:     Head: Normocephalic and atraumatic.     Nose: Nose normal. No congestion or rhinorrhea.     Mouth/Throat:     Mouth: Mucous membranes are moist.     Pharynx: No oropharyngeal exudate or posterior oropharyngeal erythema.  Eyes:     Conjunctiva/sclera: Conjunctivae normal.  Cardiovascular:     Rate and Rhythm: Normal rate and regular rhythm.     Heart sounds:  Normal heart sounds. No murmur.  Pulmonary:     Effort: Pulmonary effort is normal. No respiratory distress.  Breath sounds: Normal breath sounds. No stridor. No wheezing, rhonchi or rales.  Abdominal:     General: Abdomen is flat. Bowel sounds are normal. There is no distension.     Palpations: Abdomen is soft. There is no mass.     Tenderness: There is no abdominal tenderness. There is no right CVA tenderness, left CVA tenderness, guarding or rebound.     Hernia: No hernia is present.  Musculoskeletal:        General: No swelling or tenderness.     Cervical back: Neck supple.     Comments: In wheelchair for visit today.  Skin:    General: Skin is warm and dry.     Capillary Refill: Capillary refill takes less than 2 seconds.     Coloration: Skin is not jaundiced.     Findings: Rash present. No bruising or erythema.     Comments: Generalized fine, papular, erythematous, urticarial rash  Neurological:     General: No focal deficit present.     Mental Status: He is alert and oriented to person, place, and time.  Psychiatric:        Mood and Affect: Mood normal.        Behavior: Behavior normal.        Thought Content: Thought content normal.      UC Treatments / Results  Labs (all labs ordered are listed, but only abnormal results are displayed) Labs Reviewed - No data to display  EKG   Radiology No results found.  Procedures Procedures (including critical care time)  Medications Ordered in UC Medications  ketorolac (TORADOL) 30 MG/ML injection 30 mg (30 mg Intramuscular Given 12/11/19 1501)    Initial Impression / Assessment and Plan / UC Course  I have reviewed the triage vital signs and the nursing notes.  Pertinent labs & imaging results that were available during my care of the patient were reviewed by me and considered in my medical decision making (see chart for details).     Presents with drug rash reaction to Macrobid. Decadron 10mg  IM given in office  today. Will not do pred pack due to diabetes and risk of raising blood sugars. Instructed patient that if rash reappears, to follow up with primary care or this office. Instructed to go to the ED with SOB, high fever, severe diarrhea, or other concerning symptoms.  Did not send in new antibiotic due to urine culture showing sensitivity to Gentamycin, Impenem, nitrofurantoin, and Pip/tazo. [facility] and discussed case with their triage nurse. Pt now has appt at 12:45pm 12/12/19 with [facility] for complex UTI treatment with IM Gentamycin.   Spent 30 minutes in discussion with [facility], patient, and Dr Meda Coffee. Reviewed previous labs and urine culture results.  Final Clinical Impressions(s) / UC Diagnoses   Final diagnoses:  Rash and nonspecific skin eruption  Hives  Allergic reaction, initial encounter  E. coli UTI  Neurogenic bladder     Discharge Instructions     Follow up with appt with [facility] at 12:45pm on 12/12/19.  If having rash and itching, may use Benadryl tonight.  Go to the ED for shortness of breath, high fever, severe diarrhea, other concerning symptoms.     ED Prescriptions    None     I have reviewed the PDMP during this encounter.   Faustino Congress, NP 12/12/19 [phone]

## 2019-12-12 MED FILL — ?ATORVASTATIN 20 MG TABLET: 20 | 30 days supply | Qty: 30 | Fill #8

## 2019-12-12 MED FILL — GLIMEPIRIDE 4 MG TABS: 4 | 30 days supply | Qty: 30 | Fill #8

## 2019-12-12 MED FILL — metFORMIN HCL 1000 MG TABS: 1000 | 30 days supply | Qty: 60 | Fill #9

## 2019-12-12 MED FILL — LISINOPRIL 20 MG TABLET: 20 | 30 days supply | Qty: 30 | Fill #7

## 2019-12-13 ENCOUNTER — Other Ambulatory Visit: Payer: Self-pay | Admitting: Family Medicine

## 2019-12-13 ENCOUNTER — Telehealth: Payer: Self-pay | Admitting: Family Medicine

## 2019-12-13 DIAGNOSIS — N390 Urinary tract infection, site not specified: Secondary | ICD-10-CM

## 2019-12-13 MED ORDER — FOSFOMYCIN TROMETHAMINE 3 G PO PACK: PACK | ORAL | 0 refills | Status: DC

## 2019-12-13 MED FILL — MONUROL 3 GM SACHET: 3 | 9 days supply | Qty: 3 | Fill #0

## 2019-12-13 NOTE — Progress Notes (Signed)
Patient ID: John Meza, male   DOB: [birth year], 59 y.o.   MRN: [id number]   Patient recently seen by [facility] due to recurrent urinary tract infections as patient with neurogenic bladder due to spinal injury causing paraplegia.  Patient needs antibiotic therapy with fosfomycin, 3 g by mouth every 72 hours for 3 doses but patient did not think that he will be able to afford this medication and the other option would be for patient to have IV antibiotics.  I was contacted by [facility] regarding the medication and I contacted the lead pharmacist for [facility] and this medication can be obtained for the patient to take orally.  Medication will be ordered for patient pickup and [facility] will be notified so that they can contact patient.

## 2019-12-13 NOTE — Telephone Encounter (Signed)
Patient wife is requesting a status of the patient CAFA application. Please f/u

## 2019-12-13 NOTE — Telephone Encounter (Signed)
Pt was informed that is a copy of his CAFA letter will bet the FO

## 2019-12-18 MED FILL — MONUROL 3 GM SACHET: 3 | 9 days supply | Qty: 3 | Fill #1

## 2019-12-27 ENCOUNTER — Ambulatory Visit: Payer: Self-pay | Admitting: Family Medicine

## 2019-12-28 ENCOUNTER — Other Ambulatory Visit: Payer: Self-pay | Admitting: Family

## 2019-12-28 ENCOUNTER — Ambulatory Visit: Payer: Self-pay | Attending: Family Medicine | Admitting: Family

## 2019-12-28 ENCOUNTER — Other Ambulatory Visit: Payer: Self-pay

## 2019-12-28 VITALS — BP 131/83 | HR 78 | Temp 98.2°F | Resp 16

## 2019-12-28 DIAGNOSIS — E1169 Type 2 diabetes mellitus with other specified complication: Secondary | ICD-10-CM

## 2019-12-28 DIAGNOSIS — E785 Hyperlipidemia, unspecified: Secondary | ICD-10-CM

## 2019-12-28 DIAGNOSIS — E1165 Type 2 diabetes mellitus with hyperglycemia: Secondary | ICD-10-CM

## 2019-12-28 DIAGNOSIS — I10 Essential (primary) hypertension: Secondary | ICD-10-CM

## 2019-12-28 LAB — POCT GLYCOSYLATED HEMOGLOBIN (HGB A1C): Hemoglobin A1C: 8.3 % — AB (ref 4.0–5.6)

## 2019-12-28 LAB — GLUCOSE, POCT (MANUAL RESULT ENTRY): POC Glucose: 94 mg/dl (ref 70–99)

## 2019-12-28 MED ORDER — TRUEPLUS PEN NEEDLES 32G X 4 MM MISC: 99 refills | Status: DC

## 2019-12-28 MED ORDER — METFORMIN HCL 1000 MG PO TABS: 1000.0000 mg | ORAL_TABLET | Freq: Two times a day (BID) | ORAL | 2 refills | Status: DC

## 2019-12-28 MED ORDER — BASAGLAR KWIKPEN 100 UNIT/ML ~~LOC~~ SOPN: PEN_INJECTOR | SUBCUTANEOUS | 11 refills | Status: DC

## 2019-12-28 MED ORDER — GLIMEPIRIDE 4 MG PO TABS: 4.0000 mg | ORAL_TABLET | Freq: Every day | ORAL | 2 refills | Status: DC

## 2019-12-28 MED ORDER — ATORVASTATIN CALCIUM 20 MG PO TABS: 20.0000 mg | ORAL_TABLET | Freq: Every day | ORAL | 2 refills | Status: DC

## 2019-12-28 MED ORDER — LISINOPRIL 20 MG PO TABS: 20.0000 mg | ORAL_TABLET | Freq: Every day | ORAL | 2 refills | Status: DC

## 2019-12-28 MED ORDER — SITAGLIPTIN PHOSPHATE 50 MG PO TABS: 50.0000 mg | ORAL_TABLET | Freq: Every day | ORAL | 2 refills | Status: DC

## 2019-12-28 MED FILL — TRUEPLUS PEN NDL 32GX5/32: 32G X 4 MM | 25 days supply | Qty: 100 | Fill #0

## 2019-12-28 MED FILL — !JANUVIA 50 MG TABLET: 50 | 30 days supply | Qty: 30 | Fill #0

## 2019-12-28 MED FILL — ?BASAGLAR 100 UNITS/ML KWPE: 100 | 28 days supply | Qty: 3 | Fill #0

## 2019-12-28 NOTE — Progress Notes (Signed)
DM f /u  unable to weight/in wheelchair  CBG  Noelia id [id number]  CBG 94  Compared with patient glucometer 103 A1C 8.3

## 2019-12-28 NOTE — Progress Notes (Signed)
Patient ID: John Meza, male    DOB: [birth year]  MRN: [id number]  CC: Diabetes  Subjective: John Meza is a 59 y.o. male with history of essential hypertension, dyspepsia, neurogenic bladder, obstructive jaundice, cholecystitis, diabetes type 2, T11 fracture with paraplegia, concussion with brief loss of consciousness, spinal cord injury, hyperlipidemia, hyponatremia, hypomagnesemia, hypokalemia, and abdominal pain. Who presents for diabetes follow-up. [address], [place] ID# [id number], participated during visit. Wife, Claudie Leach, present during visit.  1. DIABETES TYPE 2: Last A1C:  8.3, December 28, 2019 Med Adherence:  '[x]'  Yes    '[]'  No Medication side effects:  '[]'  Yes    '[x]'  No Home Monitoring?  '[x]'  Yes    '[]'  No Home glucose results range: 180, 145, 95, 76, 125, 93, 83, 97, 111, 117, 115, 107, 81, 117 fasting breakfast values for March 2021 Diet Adherence: '[x]'  No. Does not eat bread but does eat tortillas often.  Exercise: '[x]'  Yes, at night 30 mins in wheelchair arm exercises Hypoglycemic episodes?: '[]'  Yes    '[x]'  No Retinopathy hx? '[]'  Yes    '[x]'  No Last eye exam: Can't remember Comments: Last visit June 2020 with Dr. Chapman Fitch.  During that encounter patient was prescribed refills on diabetic medications including Basaglar, Glimepiride, and Glucophage with follow-up to be scheduled in 4 months and were drawn at that time.  2. HYPERTENSION: Currently taking: see medication list Med Adherence: '[x]'  Yes    '[]'  No Medication side effects: '[]'  Yes    '[x]'  No Adherence with salt restriction: '[]'  Yes    '[x]'  No Exercise: Yes '[x]'  No '[]'  Home Monitoring?: '[]'  Yes    '[x]'  No Monitoring Frequency: '[]'  Yes    '[x]'  No Home BP results range: '[]'  Yes    '[x]'  No SOB? '[]'  Yes    '[x]'  No Chest Pain?: '[]'  Yes    '[x]'  No Leg swelling?: '[x]'  Yes, in past legs were swelling then started elevating and legs are normal since. Headaches?: '[]'  Yes    '[x]'  No Dizziness? '[]'  Yes    '[x]'  No Comments: Last visit  June 2020 with Dr. Chapman Fitch.  During that encounter lisinopril continued for control of hypertension and renal protection from effects of diabetes.   3. HYPERLIPIDEMIA: Last Lipid Panel results:  HDL  Date Value Ref Range Status  07/01/2018 38 (L) >39 mg/dL Final   Triglycerides  Date Value Ref Range Status  07/01/2018 171 (H) 0 - 149 mg/dL Final    Med Adherence: '[x]'  Yes    '[]'  No Medication side effects: '[]'  Yes    '[x]'  No Muscle aches:  '[]'  Yes    '[x]'  No Diet Adherence: '[x]'  Yes    '[]'  No Comments: Last visit June 20 with Dr. Chapman Fitch.  During that encounter Atorvastatin continued.  Patient Active Problem List   Diagnosis Date Noted  . Pressure injury of skin 11/04/2016  . Cellulitis of right buttock 11/03/2016  . Uncontrolled type 2 diabetes mellitus with hyperglycemia ([place]) 11/03/2016  . AP (abdominal pain)   . Sinus tachycardia   . Blood poisoning   . Diabetes type 2, uncontrolled ([place])   . Hyponatremia   . Essential hypertension   . Choledocholithiasis   . Hypomagnesemia   . Hypokalemia   . Epigastric pain   . Acute cholecystitis   . Biliary tract imaging abnormality   . Obstructive jaundice 04/30/2015  . Sepsis ([place]) 04/30/2015  . Cholecystitis 04/30/2015  . Rotator cuff tear, left 08/12/2012  . Neurogenic bladder 01/06/2012  .  Neurogenic bowel 01/06/2012  . SCI (spinal cord injury) 11/20/2011  . 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 Concussion with brief LOC 11/16/2011  . Accidental fall from ladder 11/16/2011  . DM, UNCOMPLICATED, TYPE II, UNCONTROLLED 05/26/2007  . OBESITY, MODERATE 05/26/2007  . HLD (hyperlipidemia) 05/25/2007  . DYSPEPSIA 05/25/2007     Current Outpatient Medications on File Prior to Visit  Medication Sig Dispense Refill  . aspirin EC 81 MG tablet Take 1 tablet (81 mg total) by mouth daily. Take after eating 90 tablet 3  . atorvastatin (LIPITOR) 20 MG tablet Take 1 tablet (20 mg total) by mouth daily. To lower cholesterol 90 tablet 3  . glimepiride  (AMARYL) 4 MG tablet Take 1 tablet (4 mg total) by mouth daily with breakfast. To lower blood sugar 90 tablet 4  . Insulin Glargine (BASAGLAR KWIKPEN) 100 UNIT/ML SOPN Inject 10 units into the skin daily to lower blood sugar 15 mL 11  . Insulin Pen Needle (TRUEPLUS PEN NEEDLES) 32G X 4 MM MISC Use as directed to inject insulin 100 each prn  . lisinopril (ZESTRIL) 20 MG tablet Take 1 tablet (20 mg total) by mouth daily. To lower blood pressure 90 tablet 3  . metFORMIN (GLUCOPHAGE) 1000 MG tablet Take 1 tablet (1,000 mg total) by mouth 2 (two) times daily with a meal. 180 tablet 3  . omeprazole (PRILOSEC) 20 MG capsule Take 1 capsule (20 mg total) by mouth daily. To reduce stomach acid 90 capsule 3  . fosfomycin (MONUROL) 3 g PACK Take 3 g dose by mouth every 3 days for 3 doses (Patient not taking: Reported on 12/28/2019) 9 g 0  . levofloxacin (LEVAQUIN) 500 MG tablet Take 1 tablet (500 mg total) by mouth daily. (Patient not taking: Reported on 12/28/2019) 7 tablet 0  . loratadine (CLARITIN) 10 MG tablet Take 1 tablet (10 mg total) by mouth daily. As needed for itchy throat/allergy symptoms (Patient not taking: Reported on 03/24/2019) 30 tablet 11   No current facility-administered medications on file prior to visit.    Allergies  Allergen Reactions  . Macrobid [Nitrofurantoin]   . Other Itching, Rash and Other (See Comments)    ANTIBIOTIC? CAUSED RASH, ITCHING IN 2013-PER FAMILY Possibly niacin causing flushing reaction- as described by family?    Social History   Socioeconomic History  . Marital status: Married    Spouse name: Not on file  . Number of children: Not on file  . Years of education: Not on file  . Highest education level: Not on file  Occupational History  . Not on file  Tobacco Use  . Smoking status: Former Smoker    Packs/day: 1.00    Years: 5.00    Pack years: 5.00    Types: Cigarettes    Quit date: 10/13/2011    Years since quitting: 8.2  . Smokeless tobacco: Never  Used  . Tobacco comment: 03-24-19 per pt he stopped 1 mo ago   Substance and Sexual Activity  . Alcohol use: Yes    Alcohol/week: 1.0 standard drinks    Types: 1 Cans of beer per week  . Drug use: No  . Sexual activity: Not Currently  Other Topics Concern  . Not on file  Social History Narrative  . Not on file   Social Determinants of Health   Financial Resource Strain:   . Difficulty of Paying Living Expenses:   Food Insecurity:   . Worried About Charity fundraiser in the Last Year:   .  Ran Out of Food in the Last Year:   Transportation Needs:   . Film/video editor (Medical):   [name] Lack of Transportation (Non-Medical):   Physical Activity:   . Days of Exercise per Week:   . Minutes of Exercise per Session:   Stress:   . Feeling of Stress :   Social Connections:   . Frequency of Communication with Friends and Family:   . Frequency of Social Gatherings with Friends and Family:   . Attends Religious Services:   . Active Member of Clubs or Organizations:   . Attends Archivist Meetings:   [name] Marital Status:   Intimate Partner Violence:   . Fear of Current or Ex-Partner:   . Emotionally Abused:   [name] Physically Abused:   . Sexually Abused:     Family History  Problem Relation Age of Onset  . Diabetes Father   . Diabetes Sister     Past Surgical History:  Procedure Laterality Date  . CHOLECYSTECTOMY N/A 05/01/2015   Procedure: LAPAROSCOPIC CHOLECYSTECTOMY WITH ATTEMPTED INTRAOPERATIVE CHOLANGIOGRAM;  Surgeon: Georganna Skeans, MD;  Location: [facility];  Service: General;  Laterality: N/A;  . SPINE SURGERY      ROS: Review of Systems Negative except as stated above  PHYSICAL EXAM: Vitals with BMI 12/28/2019 12/11/2019 12/02/2019  Height - - -  Weight - - -  BMI - - -  Systolic 314 970 263  Diastolic 83 87 70  Pulse 78 85 106  SpO2- 100%, room air  Respirations- 16   Physical Exam General appearance - alert, well appearing, and in no distress and  oriented to person, place, and time Mental status - alert, oriented to person, place, and time, normal mood, behavior, speech, dress, motor activity, and thought processes Eyes - pupils equal and reactive, extraocular eye movements intact, funduscopic exam normal, discs flat and sharp Chest - clear to auscultation, no wheezes, rales or rhonchi, symmetric air entry, no tachypnea, retractions or cyanosis Heart - normal rate, regular rhythm, normal S1, S2, no murmurs, rubs, clicks or gallops Neurological - alert, oriented, normal speech, no focal findings or movement disorder noted, neck supple without rigidity, cranial nerves II through XII intact, funduscopic exam normal, discs flat and sharp, motor and sensory grossly normal bilateral upper extremities, normal muscle tone bilateral upper extremities, no tremors, strength 5/5 bilateral upper extremities Musculoskeletal - no joint tenderness, deformity or swelling, paraplegia bilateral lower extremities. CMP Latest Ref Rng & Units 08/30/2019 02/20/2019 10/31/2018  Glucose 65 - 99 mg/dL 126(H) 134(H) 119(H)  BUN 6 - 24 mg/dL '13 15 17  ' Creatinine 0.76 - 1.27 mg/dL 0.67(L) 0.74(L) 0.71(L)  Sodium 134 - 144 mmol/L 138 138 139  Potassium 3.5 - 5.2 mmol/L 4.5 4.6 4.7  Chloride 96 - 106 mmol/L 102 102 102  CO2 20 - 29 mmol/L 22 21 19(L)  Calcium 8.7 - 10.2 mg/dL 9.6 9.2 9.7  Total Protein 6.0 - 8.5 g/dL 7.0 - 7.3  Total Bilirubin 0.0 - 1.2 mg/dL 0.4 - 0.4  Alkaline Phos 39 - 117 IU/L 110 - 88  AST 0 - 40 IU/L 10 - 15  ALT 0 - 44 IU/L 12 - 14   Lipid Panel     Component Value Date/Time   CHOL 145 07/01/2018 1155   TRIG 171 (H) 07/01/2018 1155   HDL 38 (L) 07/01/2018 1155   CHOLHDL 3.8 07/01/2018 1155   CHOLHDL 11.5 05/05/2015 0352   VLDL 35 05/05/2015 0352   LDLCALC 73  07/01/2018 1155    CBC    Component Value Date/Time   WBC 6.9 08/30/2019 0948   WBC 6.0 12/17/2017 1326   RBC 4.54 08/30/2019 0948   RBC 4.72 12/17/2017 1326   HGB 13.7  08/30/2019 0948   HCT 40.7 08/30/2019 0948   PLT 224 08/30/2019 0948   MCV 90 08/30/2019 0948   MCH 30.2 08/30/2019 0948   MCH 29.4 12/17/2017 1326   MCHC 33.7 08/30/2019 0948   MCHC 33.4 12/17/2017 1326   RDW 13.0 08/30/2019 0948   LYMPHSABS 2.3 10/31/2018 1226   MONOABS 0.8 11/03/2016 0721   EOSABS 0.1 10/31/2018 1226   BASOSABS 0.0 10/31/2018 1226    ASSESSMENT AND PLAN: 1. Uncontrolled type 2 diabetes mellitus with hyperglycemia ([place]): - CMP14+EGFR - CBC With Differential - Lipid panel - Glucose (CBG) - HgB A1c - sitaGLIPtin (JANUVIA) 50 MG tablet; Take 1 tablet (50 mg total) by mouth daily.  Dispense: 30 tablet; Refill: 2 - glimepiride (AMARYL) 4 MG tablet; Take 1 tablet (4 mg total) by mouth daily with breakfast. To lower blood sugar  Dispense: 90 tablet; Refill: 2 - Insulin Glargine (BASAGLAR KWIKPEN) 100 UNIT/ML; Inject 10 units into the skin daily to lower blood sugar  Dispense: 15 mL; Refill: 11 - Insulin Pen Needle (TRUEPLUS PEN NEEDLES) 32G X 4 MM MISC; Use as directed to inject insulin  Dispense: 100 each; Refill: prn - metFORMIN (GLUCOPHAGE) 1000 MG tablet; Take 1 tablet (1,000 mg total) by mouth 2 (two) times daily with a meal.  Dispense: 180 tablet; Refill: 2 -Today's A1c not at goal, 8.3, will add Januvia in addition to continuing previous diabetic medications. -To achieve an A1C goal of less than or equal to 7.0 percent, a fasting blood sugar of 80 to 130 mg/dL and a postprandial glucose (90 to 120 minutes after a meal) less than 180 mg/dL. In the event of sugars less than 60 mg/dl or greater than 400 mg/dl please notify the clinic ASAP. It is recommended that you undergo annual eye exams and annual foot exams. -If Januvia causes side effects such as but not limited to hypoglycemia, dizziness, or feeling faint please discontinue and immediately notify provider.  If symptoms are severe then report to the emergency department. -Continue to record fasting blood sugars at  home at least once or twice daily. -Discussed the importance of healthy eating habits, low-carbohydrate diet, low-sugar diet, regular aerobic exercise (at least 150 minutes a week as tolerated) and medication compliance to achieve or maintain control of diabetes. -Return in 3 months for follow-up.  2. Essential hypertension: - CMP14+EGFR - CBC With Differential - Lipid panel - lisinopril (ZESTRIL) 20 MG tablet; Take 1 tablet (20 mg total) by mouth daily. To lower blood pressure  Dispense: 90 tablet; Refill: 2 -Blood pressure at goal on today's visit, 131/83, continue Lisinopril. Counseled on blood pressure goal of less than 130/80, low-sodium, DASH diet, medication compliance, 150 minutes of moderate intensity exercise per week. Discussed medication compliance, adverse effects. -Patient have been counseled extensively about nutrition and exercise as tolerated. Other issues discussed during this visit include: low cholesterol diet, weight control and daily exercise as tolerated, foot care, annual eye examinations at Ophthalmology, importance of adherence with medications and regular follow-up. We also discussed long term complications of uncontrolled diabetes and hypertension.  . -Follow a Healthy Eating Plan - You can do it! . Limit sugary drinks.  Avoid sodas, sweet tea, sport or energy drinks, or fruit drinks.  Drink water, lo-fat  milk, or diet drinks. . Limit snack foods.   Cut back on candy, cake, cookies, chips, ice cream.  These are a special treat, only in small amounts. . Eat plenty of vegetables.  Especially dark green, red, and orange vegetables. Aim for at least 3 servings a day. More is better! Include fruit in your daily diet.  Whole fruit is much healthier than fruit juice! . Limit "white" bread, "white" pasta, "white" rice.   Choose "100% whole grain" products, brown or wild rice. [name] Avoid fatty meats. Try "Meatless Monday" and choose eggs or beans one day a week.  When eating meat,  choose lean meats like chicken, [place], and fish.  Grill, broil, or bake meats instead of frying, and eat poultry without the skin. . Eat less salt.  Avoid frozen pizzas, frozen dinners and salty foods.  Use seasonings other than salt in cooking.  This can help blood pressure and keep you from swelling . Beer, wine and liquor have calories.  If you can safely drink alcohol, limit to 1 drink per day for women, 2 drinks for men  3. Hyperlipidemia associated with type 2 diabetes mellitus ([facility]): - Lipid panel - atorvastatin (LIPITOR) 20 MG tablet; Take 1 tablet (20 mg total) by mouth daily. To lower cholesterol  Dispense: 90 tablet; Refill: 2   Requested Prescriptions    No prescriptions requested or ordered in this encounter   Follow-up with primary physician in 3 months and clinical pharmacist in 2 weeks.  Camillia Herter, NP

## 2019-12-28 NOTE — Patient Instructions (Signed)
Follow-up with primary provider in 3 months. Add Januvia.  Hipertensin en los adultos Hypertension, Adult El trmino hipertensin es otra forma de denominar a la presin arterial elevada. La presin arterial elevada fuerza al corazn a trabajar ms para bombear la sangre. Esto puede causar problemas con el paso del [place]. Una lectura de presin arterial est compuesta por 2 nmeros. Hay un nmero superior (sistlico) sobre un nmero inferior (diastlico). Lo ideal es tener la presin arterial por debajo de 120/80. Las elecciones saludables pueden ayudar a Personal assistant presin arterial, o tal vez necesite medicamentos para bajarla. Cules son las causas? Se desconoce la causa de esta afeccin. Algunas afecciones pueden estar relacionadas con la presin arterial alta. Qu incrementa el riesgo?  Fumar.  Tener diabetes mellitus tipo 2, colesterol alto, o ambos.  No hacer la cantidad suficiente de actividad fsica o ejercicio.  Tener sobrepeso.  Consumir mucha grasa, azcar, caloras o sal (sodio) en su dieta.  Beber alcohol en exceso.  Tener una enfermedad renal a largo plazo (crnica).  Tener antecedentes familiares de presin arterial alta.  Edad. Los riesgos aumentan con la edad.  Raza. El riesgo es mayor para las Statistician.  Sexo. Antes de los 45aos, los hombres corren ms [employer]. Despus de los 65aos, las mujeres corren ms [employer].  Tener apnea obstructiva del sueo.  Estrs. Cules son los signos o los sntomas?  Es posible que la presin arterial alta puede no cause sntomas. La presin arterial muy alta (crisis hipertensiva) puede provocar: ? Dolor de [place]. ? Sensaciones de preocupacin o nerviosismo (ansiedad). ? Falta de aire. ? Hemorragia nasal. ? Sensacin de Journalist, newspaper (nuseas). ? Vmitos. ? Cambios en la forma de ver. ? Dolor muy intenso en el pecho. ? Convulsiones. Cmo se trata?  Esta  afeccin se trata haciendo cambios saludables en el estilo de vida, por ejemplo: ? Consumir alimentos saludables. ? Hacer ms ejercicio. ? Beber menos alcohol.  El mdico puede recetarle medicamentos si los cambios en el estilo de vida no son suficientes para Museum/gallery curator la presin arterial y si: ? El nmero de arriba est por encima de 130. ? El nmero de abajo est por encima de 80.  Su presin arterial personal ideal puede variar. Siga estas instrucciones en su casa: Comida y bebida   Si se lo dicen, siga el plan de alimentacin de DASH (Dietary Approaches to Stop Hypertension, Maneras de alimentarse para detener la hipertensin). Para seguir este plan: ? Llene la mitad del plato de cada comida con frutas y verduras. ? Llene un cuarto del plato de cada comida con cereales integrales. Los cereales integrales incluyen pasta integral, arroz integral y pan integral. ? Coma y beba productos lcteos con bajo contenido de grasa, como leche descremada o yogur bajo en grasas. ? Llene un cuarto del plato de cada comida con protenas bajas en grasa (magras). Las protenas bajas en grasa incluyen pescado, pollo sin piel, huevos, frijoles y tofu. ? Evite consumir carne grasa, carne curada y procesada, o pollo con piel. ? Evite consumir alimentos prehechos o procesados.  Consuma menos de 1500 mg de sal por da.  No beba alcohol si: ? El mdico le indica que no lo haga. ? Est embarazada, puede estar embarazada o est tratando de quedar embarazada.  Si bebe alcohol: ? Limite la cantidad que bebe a lo siguiente:  De 0 a 1 medida por da para las mujeres.  De 0 a 2  medidas por da para los hombres. ? Est atento a la cantidad de alcohol que hay en las bebidas que toma. En los [place], una medida equivale a una botella de cerveza de 12oz (353ml), un vaso de vino de 5oz (131ml) o un vaso de una bebida alcohlica de alta graduacin de 1oz (4ml). Estilo de vida   Trabaje con  su mdico para mantenerse en un peso saludable o para perder peso. Pregntele a su mdico cul es el peso recomendable para usted.  Haga al menos 47minutos de ejercicio la [employer] de la [place]. Estos pueden incluir caminar, nadar o andar en bicicleta.  Realice al menos 30 minutos de ejercicio que fortalezca sus msculos (ejercicios de resistencia) al menos 3 das a la [place]. Estos pueden incluir levantar pesas o hacer [place].  No consuma ningn producto que contenga nicotina o tabaco, como cigarrillos, cigarrillos electrnicos y tabaco de Higher education careers adviser. Si necesita ayuda para dejar de fumar, consulte al [employer].  Controle su presin arterial en su casa tal como le indic el mdico.  Concurra a todas las visitas de seguimiento como se lo haya indicado el mdico. Esto es importante. Medicamentos  [employer] de venta libre y los recetados solamente como se lo haya indicado el mdico. Siga cuidadosamente las indicaciones.  No omita las dosis de medicamentos para la presin arterial. Los medicamentos pierden eficacia si omite dosis. El hecho de omitir las dosis tambin [place] el riesgo de otros problemas.  Pregntele a su mdico a qu efectos secundarios o reacciones a los Careers information officer. Comunquese con un mdico si:  Piensa que tiene [place] reaccin a los medicamentos que est tomando.  Tiene dolores de cabeza frecuentes (recurrentes).  Se siente mareado.  Tiene hinchazn en los tobillos.  Tiene problemas de visin. Solicite ayuda inmediatamente si:  Siente un dolor de cabeza muy intenso.  Empieza a sentirse desorientado (confundido).  Se siente dbil o adormecido.  Siente que va a desmayarse.  Tiene un dolor muy intenso en las siguientes zonas: ? Pecho. ? Vientre (abdomen).  Vomita ms de una vez.  Tiene dificultad para respirar. Resumen  El trmino hipertensin es otra forma de denominar a la presin arterial elevada.  La presin  arterial elevada fuerza al corazn a trabajar ms para bombear la sangre.  Para la [employer], una presin arterial normal es menor que 120/80.  Las decisiones saludables pueden ayudarle a disminuir su presin arterial. Si no puede bajar su presin arterial mediante decisiones saludables, es posible que deba tomar medicamentos. Esta informacin no tiene Marine scientist el consejo del mdico. Asegrese de hacerle al mdico cualquier pregunta que tenga. Document Revised: 07/14/2018 Document Reviewed: 07/14/2018 Elsevier Patient Education  [place].

## 2019-12-29 ENCOUNTER — Telehealth: Payer: Self-pay | Admitting: Family Medicine

## 2019-12-29 LAB — CMP14+EGFR
ALT: 15 IU/L (ref 0–44)
AST: 16 IU/L (ref 0–40)
Albumin/Globulin Ratio: 1.5 (ref 1.2–2.2)
Albumin: 4.3 g/dL (ref 3.8–4.9)
Alkaline Phosphatase: 92 IU/L (ref 39–117)
BUN/Creatinine Ratio: 26 — ABNORMAL HIGH (ref 9–20)
BUN: 16 mg/dL (ref 6–24)
Bilirubin Total: 0.3 mg/dL (ref 0.0–1.2)
CO2: 19 mmol/L — ABNORMAL LOW (ref 20–29)
Calcium: 9.3 mg/dL (ref 8.7–10.2)
Chloride: 102 mmol/L (ref 96–106)
Creatinine, Ser: 0.61 mg/dL — ABNORMAL LOW (ref 0.76–1.27)
GFR calc Af Amer: 129 mL/min/{1.73_m2} (ref >59)
GFR calc non Af Amer: 112 mL/min/{1.73_m2} (ref >59)
Globulin, Total: 2.8 g/dL (ref 1.5–4.5)
Glucose: 99 mg/dL (ref 65–99)
Potassium: 5.2 mmol/L (ref 3.5–5.2)
Sodium: 135 mmol/L (ref 134–144)
Total Protein: 7.1 g/dL (ref 6.0–8.5)

## 2019-12-29 LAB — LIPID PANEL
Chol/HDL Ratio: 2.8 ratio (ref 0.0–5.0)
Cholesterol, Total: 110 mg/dL (ref 100–199)
HDL: 40 mg/dL (ref >39)
LDL Chol Calc (NIH): 50 mg/dL (ref 0–99)
Triglycerides: 110 mg/dL (ref 0–149)
VLDL Cholesterol Cal: 20 mg/dL (ref 5–40)

## 2019-12-29 LAB — CBC WITH DIFFERENTIAL
Basophils Absolute: 0.1 10*3/uL (ref 0.0–0.2)
Basos: 1 %
EOS (ABSOLUTE): 0.1 10*3/uL (ref 0.0–0.4)
Eos: 2 %
Hematocrit: 38.7 % (ref 37.5–51.0)
Hemoglobin: 12.7 g/dL — ABNORMAL LOW (ref 13.0–17.7)
Immature Grans (Abs): 0 10*3/uL (ref 0.0–0.1)
Immature Granulocytes: 0 %
Lymphocytes Absolute: 2.2 10*3/uL (ref 0.7–3.1)
Lymphs: 36 %
MCH: 29.7 pg (ref 26.6–33.0)
MCHC: 32.8 g/dL (ref 31.5–35.7)
MCV: 90 fL (ref 79–97)
Monocytes Absolute: 0.5 10*3/uL (ref 0.1–0.9)
Monocytes: 8 %
Neutrophils Absolute: 3.3 10*3/uL (ref 1.4–7.0)
Neutrophils: 53 %
RBC: 4.28 x10E6/uL (ref 4.14–5.80)
RDW: 13.6 % (ref 11.6–15.4)
WBC: 6.2 10*3/uL (ref 3.4–10.8)

## 2019-12-29 LAB — HEMOGLOBIN A1C
Est. average glucose Bld gHb Est-mCnc: 197 mg/dL
Hgb A1c MFr Bld: 8.5 % — ABNORMAL HIGH (ref 4.8–5.6)

## 2019-12-29 NOTE — Telephone Encounter (Signed)
Patient returned nurse call. Please f/u.  

## 2019-12-29 NOTE — Telephone Encounter (Signed)
Tks / Will call back

## 2019-12-29 NOTE — Progress Notes (Signed)
Please call patient with update.   Kidney function higher than expected. Stay hydrated with water. No treatment needed at this time. Will reassess at follow-up visit.  Blood count normal. Eat green leafy vegetables to help keep iron normal.  Cholesterol normal.  Blood sugars addressed in clinic.

## 2020-01-02 MED FILL — MONUROL 3 GM SACHET: 3 | 9 days supply | Qty: 3 | Fill #2

## 2020-01-15 MED FILL — ?ATORVASTATIN 20 MG TABLET: 20 | 30 days supply | Qty: 30 | Fill #9

## 2020-01-15 MED FILL — metFORMIN HCL 1000 MG TABS: 1000 | 30 days supply | Qty: 60 | Fill #10

## 2020-01-15 MED FILL — LISINOPRIL 20 MG TABLET: 20 | 30 days supply | Qty: 30 | Fill #0

## 2020-01-15 MED FILL — GLIMEPIRIDE 4 MG TABS: 4 | 30 days supply | Qty: 30 | Fill #9

## 2020-01-15 MED FILL — ?OMEPRAZOLE 20 MG CAP DR: 20 | 30 days supply | Qty: 30 | Fill #1

## 2020-01-17 ENCOUNTER — Other Ambulatory Visit: Payer: Self-pay

## 2020-01-17 ENCOUNTER — Ambulatory Visit: Payer: Self-pay | Admitting: Physician Assistant

## 2020-01-17 NOTE — Progress Notes (Deleted)
Patient ID: John Meza, male   DOB: 24-Apr-1961, 59 y.o.   MRN: [id number] Virtual Visit via Telephone Note  I connected with John Meza on 01/17/20 at  2:10 PM EDT by telephone and verified that I am speaking with the correct person using two identifiers.   I discussed the limitations, risks, security and privacy concerns of performing an evaluation and management service by telephone and the availability of in person appointments. I also discussed with the patient that there may be a patient responsible charge related to this service. The patient expressed understanding and agreed to proceed.  PATIENT visit by telephone virtually in the context of Covid-19 pandemic. Patient location:  Home My Location:  [facility] office Persons on the call: me, the patient, and Alejandro(interpreter)  History of Present Illness:    Observations/Objective:   Assessment and Plan:   Follow Up Instructions:    I discussed the assessment and treatment plan with the patient. The patient was provided an opportunity to ask questions and all were answered. The patient agreed with the plan and demonstrated an understanding of the instructions.   The patient was advised to call back or seek an in-person evaluation if the symptoms worsen or if the condition fails to improve as anticipated.  I provided *** minutes of non-face-to-face time during this encounter.   [name], PA-C

## 2020-01-24 MED FILL — JANUVIA 50 MG TABLET: 50 | 30 days supply | Qty: 30 | Fill #1

## 2020-02-09 MED FILL — metFORMIN HCL 1000 MG TABS: 1000 | 30 days supply | Qty: 60 | Fill #11

## 2020-02-09 MED FILL — ?BASAGLAR 100 UNITS/ML KWPE: 100 | 28 days supply | Qty: 3 | Fill #1

## 2020-02-09 MED FILL — GLIMEPIRIDE 4 MG TABS: 4 | 30 days supply | Qty: 30 | Fill #10

## 2020-03-14 MED FILL — ?ATORVASTATIN 20 MG TABLET: 20 | 30 days supply | Qty: 30 | Fill #10

## 2020-03-14 MED FILL — metFORMIN HCL 1000 MG TABS: 1000 | 30 days supply | Qty: 60 | Fill #0

## 2020-03-14 MED FILL — LISINOPRIL 20 MG TABLET: 20 | 30 days supply | Qty: 30 | Fill #1

## 2020-03-14 MED FILL — JANUVIA 50 MG TABLET: 50 | 30 days supply | Qty: 30 | Fill #2

## 2020-03-14 MED FILL — GLIMEPIRIDE 4 MG TABS: 4 | 30 days supply | Qty: 30 | Fill #11

## 2020-03-14 MED FILL — ?BASAGLAR 100 UNITS/ML KWPE: 100 | 28 days supply | Qty: 3 | Fill #2

## 2020-03-27 ENCOUNTER — Ambulatory Visit: Payer: Self-pay

## 2020-03-27 ENCOUNTER — Other Ambulatory Visit: Payer: Self-pay

## 2020-03-29 ENCOUNTER — Ambulatory Visit: Payer: Self-pay | Admitting: Family Medicine

## 2020-04-12 ENCOUNTER — Other Ambulatory Visit: Payer: Self-pay | Admitting: Family

## 2020-04-12 ENCOUNTER — Other Ambulatory Visit: Payer: Self-pay | Admitting: Family Medicine

## 2020-04-12 DIAGNOSIS — E1165 Type 2 diabetes mellitus with hyperglycemia: Secondary | ICD-10-CM

## 2020-04-12 DIAGNOSIS — G8220 Paraplegia, unspecified: Secondary | ICD-10-CM

## 2020-04-12 MED FILL — metFORMIN HCL 1000 MG TABS: 1000 | 30 days supply | Qty: 60 | Fill #1

## 2020-04-12 MED FILL — ?BASAGLAR 100 UNITS/ML KWPE: 100 | 28 days supply | Qty: 3 | Fill #3

## 2020-04-12 MED FILL — LISINOPRIL 20 MG TABLET: 20 | 30 days supply | Qty: 30 | Fill #2

## 2020-04-12 MED FILL — ?OMEPRAZOLE 20 MG CPDR: 20 | 30 days supply | Qty: 30 | Fill #0

## 2020-04-12 MED FILL — GLIMEPIRIDE 4 MG TABS: 4 | 30 days supply | Qty: 30 | Fill #0

## 2020-04-12 MED FILL — ?ATORVASTATIN 20 MG TABLET: 20 | 30 days supply | Qty: 30 | Fill #0

## 2020-04-16 NOTE — Progress Notes (Signed)
Virtual Visit via Telephone Note  I connected with John Meza, on 04/18/2020 at 10:09 AM by telephone due to the COVID-19 pandemic and verified that I am speaking with the correct person using two identifiers.  Due to current restrictions/limitations of in-office visits due to the COVID-19 pandemic, this scheduled clinical appointment was converted to a telehealth visit.   Consent: I discussed the limitations, risks, security and privacy concerns of performing an evaluation and management service by telephone and the availability of in person appointments. I also discussed with the patient that there may be a patient responsible charge related to this service. The patient expressed understanding and agreed to proceed.  Location of Patient: Home  Location of Provider: [facility]  Persons participating in Telemedicine visit: John Meza John Colee [place], NP John Meza, [place]  History of Present Illness: Patient ID: John Meza, male    DOB: 19-Jan-1961  MRN: [id number]  CC: Diabetes and High Blood Pressure Follow-Up  Subjective:  John Meza is a 59 y.o. male with history of essential hypertension, dyspepsia, neurogenic bladder, cholecystitis, diabetes type 2 uncontrolled,  T11 fx with paraplegia, spinal cord injury, hyperlipidemia, sepsis, sinus tachycardia, hyponatremia, hypomagnesemia, hypokalemia, and abdominal pain who presents for diabetes and high blood pressure follow-up.  1. DIABETES TYPE 2 FOLLOW-UP: Last A1C: 8.5%, March 2021 Med Adherence:  []  Yes    [x]  No, patient reports he never began taking Januvia as prescribed in March 2021. Reports he was not aware that he was supposed to be taking this medication.  Medication side effects:  []  Yes    [x]  No Home Monitoring?  [x]  Yes Home glucose results range: 92-115 Diet Adherence: [x]  Yes    []  No Exercise: [x]  No Hypoglycemic episodes?: []  Yes    [x]  No Numbness of the feet?  []  Yes    [x]  No Retinopathy hx? []  Yes    [x]  No Last eye exam: can't remember, long time ago Comments: Last visit 12/28/2019. During that encounter diabetes uncontrolled. Continued on Amaryl 4 mg daily, Metformin 1000 mg twice daily, and Basaglar insulin 10 units daily. Januvia 50 mg daily added to regimen. CMP, CBC, lipid panel, CBG, and A1C obtained.  2. HYPERTENSION FOLLOW-UP: Currently taking: see medication list Med Adherence: [x]  Yes    []  No Medication side effects: []  Yes    [x]  No Adherence with salt restriction: [x]  Yes    []  No Exercise: Yes []  No [x]  Home Monitoring?: []  Yes    [x]  No Monitoring Frequency: []  Yes    [x]  No Home BP results range: []  Yes    [x]  No Smoking []  Yes [x]  No SOB? []  Yes    [x]  No Chest Pain?: []  Yes    [x]  No Leg swelling?: []  Yes    [x]  No Headaches?: []  Yes    [x]  No Dizziness? []  Yes    [x]  No Comments: Last visit 12/28/2019. During that encounter blood pressure at goal and continued on Lisinopril 20 mg daily. CMP, CBC, and lipid panel obtained.  3. HYPERLIPIDEMIA FOLLOW-UP: Last Lipid Panel results:  HDL  Date Value Ref Range Status  12/28/2019 40 >39 mg/dL Final   Triglycerides  Date Value Ref Range Status  12/28/2019 110 0 - 149 mg/dL Final    Med Adherence: [x]  Yes    []  No Medication side effects: []  Yes    [x]  No Muscle aches:  []  Yes    [x]  No Diet Adherence: [x]  Yes    []   No Comments: Last visit 12/28/2019. During that encounter continued on Atorvastatin 20 mg daily and lipid panel obtained.   4. DENTAL PAIN: Duration:  Involved teeth: left and upper Dentist evaluation: no Mechanism of injury:  unknown Onset: sudden Aggravating factors: chewing Alleviating factors: nothing Status: fluctuating Treatments attempted: nothing Relief with NSAIDs?: No NSAIDs Taken Fevers: no Swelling: yes, this comes and goes  Redness: no Requests referral to dentist.   Past Medical History:  Diagnosis Date  . Cellulitis and abscess of  buttock 10/2016  . Diabetes mellitus   . Hypertension   . Paraplegia (HCC) 2013   fell from ladder  . SCI (spinal cord injury)   . Spine fracture 11/16/2011   T 11- T9-L1   Allergies  Allergen Reactions  . Macrobid [Nitrofurantoin]   . Other Itching, Rash and Other (See Comments)    ANTIBIOTIC? CAUSED RASH, ITCHING IN 2013-PER FAMILY Possibly niacin causing flushing reaction- as described by family?    Current Outpatient Medications on File Prior to Visit  Medication Sig Dispense Refill  . aspirin EC 81 MG tablet Take 1 tablet (81 mg total) by mouth daily. Take after eating 90 tablet 3  . atorvastatin (LIPITOR) 20 MG tablet Take 1 tablet (20 mg total) by mouth daily. To lower cholesterol 90 tablet 2  . fosfomycin (MONUROL) 3 g PACK Take 3 g dose by mouth every 3 days for 3 doses (Patient not taking: Reported on 12/28/2019) 9 g 0  . glimepiride (AMARYL) 4 MG tablet Take 1 tablet (4 mg total) by mouth daily with breakfast. To lower blood sugar 90 tablet 2  . Insulin Glargine (BASAGLAR KWIKPEN) 100 UNIT/ML Inject 10 units into the skin daily to lower blood sugar 15 mL 11  . Insulin Pen Needle (TRUEPLUS PEN NEEDLES) 32G X 4 MM MISC Use as directed to inject insulin 100 each prn  . levofloxacin (LEVAQUIN) 500 MG tablet Take 1 tablet (500 mg total) by mouth daily. (Patient not taking: Reported on 12/28/2019) 7 tablet 0  . lisinopril (ZESTRIL) 20 MG tablet Take 1 tablet (20 mg total) by mouth daily. To lower blood pressure 90 tablet 2  . loratadine (CLARITIN) 10 MG tablet Take 1 tablet (10 mg total) by mouth daily. As needed for itchy throat/allergy symptoms (Patient not taking: Reported on 03/24/2019) 30 tablet 11  . metFORMIN (GLUCOPHAGE) 1000 MG tablet Take 1 tablet (1,000 mg total) by mouth 2 (two) times daily with a meal. 180 tablet 2  . omeprazole (PRILOSEC) 20 MG capsule TAKE 1 CAPSULE (20 MG TOTAL) BY MOUTH DAILY. TO REDUCE STOMACH ACID 30 capsule 3  . sitaGLIPtin (JANUVIA) 50 MG tablet  Take 1 tablet (50 mg total) by mouth daily. 30 tablet 2   No current facility-administered medications on file prior to visit.    Observations/Objective: Alert and oriented x 3. Not in acute distress. Physical examination not completed as this is a telemedicine visit.  Assessment and Plan: 1. Uncontrolled type 2 diabetes mellitus with hyperglycemia (HCC): -Continue Metformin, Glimepiride, and Insulin Glargine as prescribed. -Begin taking Sitagliptin as prescribed. Patient initially ordered this medication in March 2021. Patient reports he never began taking the medication because he wasn't aware that he was supposed to be taking this. -To achieve an A1C goal of less than or equal to 7.0 percent, a fasting blood sugar of 80 to 130 mg/dL and a postprandial glucose (90 to 120 minutes after a meal) less than 180 mg/dL. In the event of sugars less than  60 mg/dl or greater than 270 mg/dl please notify the clinic ASAP. It is recommended that you undergo annual eye exams and annual foot exams. -Discussed the importance of healthy eating habits, low-carbohydrate diet, low-sugar diet, regular aerobic exercise (at least 150 minutes a week as tolerated) and medication compliance to achieve or maintain control of diabetes. -Last CMP March 2021. Will do BMP next in August 2021. -Last CBC March 2021. Next due August 2021. -Last lipid panel March 2021. -Last hemoglobin A1C March 2021.Next due August 2021.  -Referral to Ophthalmology for diabetic eye exam. -Follow-up with clinical pharmacist in 4 weeks for diabetes checkup. Write your home blood sugar results down each day and bring those results to your appointment along with your home glucose monitor. -Follow-up with primary physician in 4 months or sooner if needed. - CBC With Differential; Future - Basic Metabolic Panel; Future - Hemoglobin A1c; Future - Amb Referral to Clinical Pharmacist - sitaGLIPtin (JANUVIA) 50 MG tablet; Take 1 tablet (50 mg total)  by mouth daily.  Dispense: 30 tablet; Refill: 2 - metFORMIN (GLUCOPHAGE) 1000 MG tablet; Take 1 tablet (1,000 mg total) by mouth 2 (two) times daily with a meal.  Dispense: 180 tablet; Refill: 2 - Insulin Pen Needle (TRUEPLUS PEN NEEDLES) 32G X 4 MM MISC; Use as directed to inject insulin  Dispense: 100 each; Refill: prn - Insulin Glargine (BASAGLAR KWIKPEN) 100 UNIT/ML; Inject 10 units into the skin daily to lower blood sugar  Dispense: 15 mL; Refill: 11 - glimepiride (AMARYL) 4 MG tablet; Take 1 tablet (4 mg total) by mouth daily with breakfast. To lower blood sugar  Dispense: 30 tablet; Refill: 3 - Ambulatory referral to Ophthalmology  2. Essential hypertension: -Continue Lisinopril as prescribed.  -Counseled on blood pressure goal of less than 130/80, low-sodium, DASH diet, medication compliance, 150 minutes of moderate intensity exercise per week as tolerated. Discussed medication compliance, adverse effects. -Last CMP March 2021. Will do BMP next in August 2021. -Last CBC March 2021. Next due August 2021. -Follow-up with clinical pharmacist in 4 weeks for blood pressure checkup. Patient does not have blood pressure monitor at home. -Follow-up with primary physician in 4 months or sooner if needed. - CBC With Differential; Future - Basic Metabolic Panel; Future - lisinopril (ZESTRIL) 20 MG tablet; Take 1 tablet (20 mg total) by mouth daily. To lower blood pressure  Dispense: 90 tablet; Refill: 2  3. Hyperlipidemia associated with type 2 diabetes mellitus (HCC): -Continue Atorvastatin as prescribed. -Practice low-fat heart healthy diet and at least 150 minutes of moderate intensity exercise weekly as tolerated.  -Lipid panel last obtained March 2021. -Follow-up with primary physician in 4 months or sooner if needed. - atorvastatin (LIPITOR) 20 MG tablet; Take 1 tablet (20 mg total) by mouth daily. To lower cholesterol  Dispense: 30 tablet; Refill: 3   4. Pain, dental: -Patient with  upper left dental pain with some swelling that comes and goes. Reports pain is worse with chewing. Patient requests referral to dentist.  -Referral to Dentistry for further evaluation and management. - Ambulatory referral to Dentistry  5. Language barrier: Equities trader participated during this visit. Interpreter Name: John Meza, [id number]. Patient's wife John Meza also participated with interpretation.    Follow Up Instructions: Follow-up with clinical pharmacist in 4 weeks for diabetes and hypertension check. Labs to be done in 4 weeks after visit with clinical pharmacist. Follow-up with primary physician in 4 months or sooner if needed for management of chronic conditions.    Patient  was given clear instructions to go to Emergency Department or return to medical center if symptoms don't improve, worsen, or new problems develop.The patient verbalized understanding.  I discussed the assessment and treatment plan with the patient. The patient was provided an opportunity to ask questions and all were answered. The patient agreed with the plan and demonstrated an understanding of the instructions.   The patient was advised to call back or seek an in-person evaluation if the symptoms worsen or if the condition fails to improve as anticipated.   I provided 45 minutes total of non-face-to-face time during this encounter including median intraservice time, reviewing previous notes, labs, imaging, medications, management and patient verbalized understanding.    Rema Fendt, NP  [facility] [place], [place] [phone]   04/18/2020, 10:09 AM

## 2020-04-18 ENCOUNTER — Other Ambulatory Visit: Payer: Self-pay

## 2020-04-18 ENCOUNTER — Ambulatory Visit: Payer: Self-pay | Attending: Family Medicine | Admitting: Family

## 2020-04-18 DIAGNOSIS — E1165 Type 2 diabetes mellitus with hyperglycemia: Secondary | ICD-10-CM

## 2020-04-18 DIAGNOSIS — E785 Hyperlipidemia, unspecified: Secondary | ICD-10-CM

## 2020-04-18 DIAGNOSIS — I10 Essential (primary) hypertension: Secondary | ICD-10-CM

## 2020-04-18 DIAGNOSIS — Z789 Other specified health status: Secondary | ICD-10-CM

## 2020-04-18 DIAGNOSIS — K0889 Other specified disorders of teeth and supporting structures: Secondary | ICD-10-CM

## 2020-04-18 DIAGNOSIS — E1169 Type 2 diabetes mellitus with other specified complication: Secondary | ICD-10-CM

## 2020-04-18 MED ORDER — TRUEPLUS PEN NEEDLES 32G X 4 MM MISC: 99 refills | Status: DC

## 2020-04-18 MED ORDER — METFORMIN HCL 1000 MG PO TABS: 1000.0000 mg | ORAL_TABLET | Freq: Two times a day (BID) | ORAL | 2 refills | Status: DC

## 2020-04-18 MED ORDER — GLIMEPIRIDE 4 MG PO TABS: 4.0000 mg | ORAL_TABLET | Freq: Every day | ORAL | 3 refills | Status: DC

## 2020-04-18 MED ORDER — ATORVASTATIN CALCIUM 20 MG PO TABS: 20.0000 mg | ORAL_TABLET | Freq: Every day | ORAL | 3 refills | Status: DC

## 2020-04-18 MED ORDER — SITAGLIPTIN PHOSPHATE 50 MG PO TABS: 50.0000 mg | ORAL_TABLET | Freq: Every day | ORAL | 2 refills | Status: DC

## 2020-04-18 MED ORDER — BASAGLAR KWIKPEN 100 UNIT/ML ~~LOC~~ SOPN: PEN_INJECTOR | SUBCUTANEOUS | 11 refills | Status: DC

## 2020-04-18 MED ORDER — LISINOPRIL 20 MG PO TABS: 20.0000 mg | ORAL_TABLET | Freq: Every day | ORAL | 2 refills | Status: DC

## 2020-04-18 MED FILL — TRUEplus 5-BEVEL PEN NEEDLE: 32G X 4 MM | 25 days supply | Qty: 100 | Fill #0

## 2020-04-18 MED FILL — JANUVIA 50 MG TABLET: 50 | 30 days supply | Qty: 30 | Fill #0

## 2020-04-18 NOTE — Patient Instructions (Addendum)
Contine con insulina Amaryl, Metformin y Hospital doctor para la diabetes. Empiece a tomar Januvia para la diabetes. Contine con Lisinopril para la presin arterial alta. Contine con Atorvastatin para el colesterol alto. Las pruebas de laboratorio deben International aid/development worker en 4 semanas. Haga un seguimiento con el farmacutico clnico en 4 semanas para controlar la diabetes y la presin arterial alta. Haga un seguimiento con el mdico de atencin primaria en 4 meses o antes si es necesario. Remisin al dentista por dolor de dientes.  Continue Amaryl, Metformin, and Basaglar insulin for diabetes. Begin taking Januvia for diabetes. Continue Lisinopril for high blood pressure. Continue Atorvastatin for high cholesterol. Labs due in 4 weeks. Follow-up with clinical pharmacist in 4 weeks for diabetes and high blood pressure check. Follow-up with primary physician in 4 months or sooner if needed. Referral to dentist for tooth pain.  Diabetes Basics  Diabetes (diabetes mellitus) is a long-term (chronic) disease. It occurs when the body does not properly use sugar (glucose) that is released from food after you eat. Diabetes may be caused by one or both of these problems:  Your pancreas does not make enough of a hormone called insulin.  Your body does not react in a normal way to insulin that it makes. Insulin lets sugars (glucose) go into cells in your body. This gives you energy. If you have diabetes, sugars cannot get into cells. This causes high blood sugar (hyperglycemia). Follow these instructions at home: How is diabetes treated? You may need to take insulin or other diabetes medicines daily to keep your blood sugar in balance. Take your diabetes medicines every day as told by your doctor. List your diabetes medicines here: Diabetes medicines  Name of medicine: ______________________________ ? Amount (dose): _______________ Time (a.m./p.m.): _______________ Notes: ___________________________________  Name of  medicine: ______________________________ ? Amount (dose): _______________ Time (a.m./p.m.): _______________ Notes: ___________________________________  Name of medicine: ______________________________ ? Amount (dose): _______________ Time (a.m./p.m.): _______________ Notes: ___________________________________ If you use insulin, you will learn how to give yourself insulin by injection. You may need to adjust the amount based on the food that you eat. List the types of insulin you use here: Insulin  Insulin type: ______________________________ ? Amount (dose): _______________ Time (a.m./p.m.): _______________ Notes: ___________________________________  Insulin type: ______________________________ ? Amount (dose): _______________ Time (a.m./p.m.): _______________ Notes: ___________________________________  Insulin type: ______________________________ ? Amount (dose): _______________ Time (a.m./p.m.): _______________ Notes: ___________________________________  Insulin type: ______________________________ ? Amount (dose): _______________ Time (a.m./p.m.): _______________ Notes: ___________________________________  Insulin type: ______________________________ ? Amount (dose): _______________ Time (a.m./p.m.): _______________ Notes: ___________________________________ How do I manage my blood sugar?  Check your blood sugar levels using a blood glucose monitor as directed by your doctor. Your doctor will set treatment goals for you. Generally, you should have these blood sugar levels:  Before meals (preprandial): 80-130 mg/dL (8.3-3.8 mmol/L).  After meals (postprandial): below 180 mg/dL (10 mmol/L).  A1c level: less than 7%. Write down the times that you will check your blood sugar levels: Blood sugar checks  Time: _______________ Notes: ___________________________________  Time: _______________ Notes: ___________________________________  Time: _______________ Notes:  ___________________________________  Time: _______________ Notes: ___________________________________  Time: _______________ Notes: ___________________________________  Time: _______________ Notes: ___________________________________  What do I need to know about low blood sugar? Low blood sugar is called hypoglycemia. This is when blood sugar is at or below 70 mg/dL (3.9 mmol/L). Symptoms may include:  Feeling: ? Hungry. ? Worried or nervous (anxious). ? Sweaty and clammy. ? Confused. ? Dizzy. ? Sleepy. ? Sick to your stomach (nauseous).  Having: ? A fast heartbeat. ? A headache. ? A change in your vision. ? Tingling or no feeling (numbness) around the mouth, lips, or tongue. ? Jerky movements that you cannot control (seizure).  Having trouble with: ? Moving (coordination). ? Sleeping. ? Passing out (fainting). ? Getting upset easily (irritability). Treating low blood sugar To treat low blood sugar, eat or drink something sugary right away. If you can think clearly and swallow safely, follow the 15:15 rule:  Take 15 grams of a fast-acting carb (carbohydrate). Talk with your doctor about how much you should take.  Some fast-acting carbs are: ? Sugar tablets (glucose pills). Take 3-4 glucose pills. ? 6-8 pieces of hard candy. ? 4-6 oz (120-150 mL) of fruit juice. ? 4-6 oz (120-150 mL) of regular (not diet) soda. ? 1 Tbsp (15 mL) honey or sugar.  Check your blood sugar 15 minutes after you take the carb.  If your blood sugar is still at or below 70 mg/dL (3.9 mmol/L), take 15 grams of a carb again.  If your blood sugar does not go above 70 mg/dL (3.9 mmol/L) after 3 tries, get help right away.  After your blood sugar goes back to normal, eat a meal or a snack within 1 hour. Treating very low blood sugar If your blood sugar is at or below 54 mg/dL (3 mmol/L), you have very low blood sugar (severe hypoglycemia). This is an emergency. Do not wait to see if the symptoms  will go away. Get medical help right away. Call your local emergency services (911 in the [place] Do not drive yourself to the hospital. Questions to ask your health care provider  Do I need to meet with a diabetes educator?  What equipment will I need to care for myself at home?  What diabetes medicines do I need? When should I take them?  How often do I need to check my blood sugar?  What number can I call if I have questions?  When is my next doctor's visit?  Where can I find a support group for people with diabetes? Where to find more information  American Diabetes Association: www.diabetes.org  American Association of Diabetes Educators: www.diabeteseducator.org/patient-resources Contact a doctor if:  Your blood sugar is at or above 240 mg/dL (29.9 mmol/L) for 2 days in a row.  You have been sick or have had a fever for 2 days or more, and you are not getting better.  You have any of these problems for more than 6 hours: ? You cannot eat or drink. ? You feel sick to your stomach (nauseous). ? You throw up (vomit). ? You have watery poop (diarrhea). Get help right away if:  Your blood sugar is lower than 54 mg/dL (3 mmol/L).  You get confused.  You have trouble: ? Thinking clearly. ? Breathing. Summary  Diabetes (diabetes mellitus) is a long-term (chronic) disease. It occurs when the body does not properly use sugar (glucose) that is released from food after digestion.  Take insulin and diabetes medicines as told.  Check your blood sugar every day, as often as told.  Keep all follow-up visits as told by your doctor. This is important. This information is not intended to replace advice given to you by your health care provider. Make sure you discuss any questions you have with your health care provider. Document Revised: 06/21/2019 Document Reviewed: 12/31/2017 Elsevier Patient Education  2020 [employer].

## 2020-05-01 ENCOUNTER — Telehealth: Payer: Self-pay | Admitting: Family Medicine

## 2020-05-01 NOTE — Telephone Encounter (Signed)
Copied from CRM [id number]. Topic: General - Call Back - No Documentation >> Apr 30, 2020  1:25 PM Randol Kern wrote: Reason for CRM: Pt's wife is requesting call back from nurse, (Spanish speaking required)  Best contact: [phone]

## 2020-05-01 NOTE — Telephone Encounter (Signed)
Spoke with pt wife asked for informations regarding seeing a  dentist. Urgent tooth phone nr provided /

## 2020-05-14 MED FILL — GLIMEPIRIDE 4 MG TABS: 4 | 30 days supply | Qty: 30 | Fill #1

## 2020-05-14 MED FILL — metFORMIN HCL 1000 MG TABS: 1000 | 30 days supply | Qty: 60 | Fill #2

## 2020-05-14 MED FILL — ?ATORVASTATIN 20 MG TABLET: 20 | 30 days supply | Qty: 30 | Fill #1

## 2020-05-14 MED FILL — ?BASAGLAR 100 UNITS/ML KWPE: 100 | 28 days supply | Qty: 3 | Fill #4

## 2020-05-14 MED FILL — LISINOPRIL 20 MG TABLET: 20 | 30 days supply | Qty: 30 | Fill #3

## 2020-05-14 MED FILL — $JANUVIA 50 MG TABLET: 50 | 30 days supply | Qty: 30 | Fill #1

## 2020-05-19 NOTE — Progress Notes (Signed)
S:  Patient arrives in no acute distress. Presents for diabetes evaluation, education, and management.  Patient was last seen by NP on behalf of PCP and referred on 04/18/20.   Family hx: father and sister with diabetes Tobacco: former smoker Alcohol: none  Insurance coverage/medication affordability: out of pocket   Current diabetes medications:  1. Metformin 1000 mg BID 2. Basaglar 10 units daily 3. Glimepiride 4 mg daily - takes in morning 4. Januvia 50 mg daily  Adverse effects: endorses constipation (not new), denies diarrhea/GI upset  Medication adherence: no missed doses   Prior diabetes med trials: insulin aspart, lantus, toujeo, pioglitazone, janumet  Hypoglycemia: - Reports some fasting BG lows of 75-80, denies any BG < 70 in last month - Symptoms: anxiety, hunger - Treats with: does not treat lows  Lifestyle: Diet: Eats 2 meals/day - Lunch (11-12) - Dinner (7): tortillas, protein, vegetables - Drinks: zero sugar soda with meals, water  Exercise: paraplegia, wheel chair bound  DM Comorbidities: - Neuropathy: reports occasional numbness in fingertips that resolves on own - last DFE 11/01/19 - Retinopathy: none  - last eye exam: 04/2020   Current hypertension medications include: lisinopril 20 mg daily Current hyperlipidemia medications include: atorvastatin 20 mg  O:  A1c: 8.3% on 05/20/20 POCT BG today: 164  POCT BG (using pts meter): 148 (meter is 57 year old)  Home BG Readings FBG: see below 2 hour post-meal/random: not checking  Date FBG  05/19/2020 180  05/18/2020 178  05/17/2020   05/16/2020 121  05/15/2020 120  05/14/2020 110  05/13/2020 110  05/12/2020   05/11/2020 110  05/10/2020   05/09/2020 70  05/08/2020 90  05/07/2020 114  05/06/2020 116  05/05/2020 110  05/04/2020 100  05/03/2020   05/02/2020 90  05/01/2020   04/30/2020 160  04/29/2020 115  04/28/2020 130  04/27/2020 131  04/26/2020 130  04/25/2020 121  04/24/2020 150  04/23/2020   04/22/2020 121   04/21/2020   04/20/2020 93  04/19/2020 91  04/18/2020 100     Average 118   Lipid Panel   LDL Calc 50 12/28/19  Clinical Atherosclerotic Cardiovascular Disease (ASCVD): 10 year risk 11.6% (using LDL 70 and TC 130 to allow calculation)  A: 1. Diabetes - Most recent A1c not at goal < 7%, no change from prior - Clinic FBG today not at goal of <130 - Average home FBG readings at goal, some above goal d/t larger meals the night prior, discussed portion sizes and CHO intake impact on FBG with pt, encouraged balanced diet - A1c likely elevated d/t uncontrolled BG throughout day, no post prandial home readings to assess - Hypoglycemia: denies true lows, reports some fasting BGs in 70-80s. Reviewed Rule of 15s and appropriate hypoglycemia treatment - Denies missed doses, tolerating current med regimen.  - Pt concerned meter is inaccurate compared to clinic BG readings. Discussed normal changes in BG over time even without meals. Glucose from BMP does not show large discrepancy vs pt's meter. Pt agreeable to continue using current meter unless otherwise instructed.  2. ASCVD: primary prevention - Last LDL at goal - On high intensity statin - Pt already on aspirin  P:  - Increase Januvia to 100 mg daily - Change glimepiride 4 mg once daily in morning to 4 mg once daily with first largest meal at noon  - Continue metformin 1000 mg BID and Basaglar 10 units daily - Start checking post-prandial BG 3x/week and continue checking fasting BG daily, bring log to  next visit - F/u with PharmD Clinic Visit in 4 weeks  Total time in face to face counseling 45 minutes.    Laverna Peace, PharmD PGY-1 Ambulatory Care Pharmacy Resident 05/20/2020 11:57 AM  Butch Penny, PharmD, CPP Clinical Pharmacist [facility] [phone]

## 2020-05-20 ENCOUNTER — Ambulatory Visit: Payer: Self-pay | Attending: Family Medicine | Admitting: Pharmacist

## 2020-05-20 ENCOUNTER — Other Ambulatory Visit: Payer: Self-pay

## 2020-05-20 DIAGNOSIS — I10 Essential (primary) hypertension: Secondary | ICD-10-CM

## 2020-05-20 DIAGNOSIS — E1165 Type 2 diabetes mellitus with hyperglycemia: Secondary | ICD-10-CM

## 2020-05-21 ENCOUNTER — Telehealth: Payer: Self-pay

## 2020-05-21 ENCOUNTER — Encounter: Payer: Self-pay | Admitting: Pharmacist

## 2020-05-21 ENCOUNTER — Telehealth: Payer: Self-pay | Admitting: Pharmacist

## 2020-05-21 LAB — BASIC METABOLIC PANEL
BUN/Creatinine Ratio: 18 (ref 9–20)
BUN: 12 mg/dL (ref 6–24)
CO2: 21 mmol/L (ref 20–29)
Calcium: 9.8 mg/dL (ref 8.7–10.2)
Chloride: 103 mmol/L (ref 96–106)
Creatinine, Ser: 0.67 mg/dL — ABNORMAL LOW (ref 0.76–1.27)
GFR calc Af Amer: 123 mL/min/{1.73_m2} (ref >59)
GFR calc non Af Amer: 107 mL/min/{1.73_m2} (ref >59)
Glucose: 139 mg/dL — ABNORMAL HIGH (ref 65–99)
Potassium: 5.1 mmol/L (ref 3.5–5.2)
Sodium: 140 mmol/L (ref 134–144)

## 2020-05-21 LAB — CBC WITH DIFFERENTIAL
Basophils Absolute: 0 10*3/uL (ref 0.0–0.2)
Basos: 1 %
EOS (ABSOLUTE): 0.1 10*3/uL (ref 0.0–0.4)
Eos: 2 %
Hematocrit: 39.7 % (ref 37.5–51.0)
Hemoglobin: 13 g/dL (ref 13.0–17.7)
Immature Grans (Abs): 0 10*3/uL (ref 0.0–0.1)
Immature Granulocytes: 0 %
Lymphocytes Absolute: 1.8 10*3/uL (ref 0.7–3.1)
Lymphs: 33 %
MCH: 29.3 pg (ref 26.6–33.0)
MCHC: 32.7 g/dL (ref 31.5–35.7)
MCV: 90 fL (ref 79–97)
Monocytes Absolute: 0.5 10*3/uL (ref 0.1–0.9)
Monocytes: 9 %
Neutrophils Absolute: 3 10*3/uL (ref 1.4–7.0)
Neutrophils: 55 %
RBC: 4.43 x10E6/uL (ref 4.14–5.80)
RDW: 13.4 % (ref 11.6–15.4)
WBC: 5.4 10*3/uL (ref 3.4–10.8)

## 2020-05-21 LAB — HEMOGLOBIN A1C
Est. average glucose Bld gHb Est-mCnc: 192 mg/dL
Hgb A1c MFr Bld: 8.3 % — ABNORMAL HIGH (ref 4.8–5.6)

## 2020-05-21 LAB — GLUCOSE, POCT (MANUAL RESULT ENTRY): POC Glucose: 164 mg/dl — AB (ref 70–99)

## 2020-05-21 MED ORDER — SITAGLIPTIN PHOSPHATE 100 MG PO TABS: 100.0000 mg | ORAL_TABLET | Freq: Every day | ORAL | 2 refills | Status: DC

## 2020-05-21 NOTE — Progress Notes (Signed)
Please call patient with update.   Patient had appointment with clinical pharmacist on yesterday at which time hemoglobin A1C and CBG were discussed. Medications were revised at that time.  Kidney function normal.   No anemia.

## 2020-05-21 NOTE — Telephone Encounter (Signed)
[employer] laura  Id# [phone]  contacted pt to go over lab results pt is aware and doesn't have any questions or concerns

## 2020-05-21 NOTE — Telephone Encounter (Signed)
Call placed to discuss lab results. No answer. Left HIPAA-compliant VM with instructions to return our call. A1c remains unchanged, Januvia dose increased from 50 to 100 mg daily.

## 2020-05-22 MED FILL — JANUVIA 100 MG TABLET: 100 | 30 days supply | Qty: 30 | Fill #0

## 2020-06-12 MED FILL — LISINOPRIL 20 MG TABLET: 20 | 30 days supply | Qty: 30 | Fill #4

## 2020-06-12 MED FILL — ?BASAGLAR 100 UNITS/ML KWPE: 100 | 28 days supply | Qty: 3 | Fill #5

## 2020-06-12 MED FILL — JANUVIA 100 MG TABLET: 100 | 30 days supply | Qty: 30 | Fill #0

## 2020-06-12 MED FILL — metFORMIN HCL 1000 MG TABS: 1000 | 30 days supply | Qty: 60 | Fill #3

## 2020-06-12 MED FILL — GLIMEPIRIDE 4 MG TABS: 4 | 30 days supply | Qty: 30 | Fill #2

## 2020-06-12 MED FILL — ?ATORVASTATIN 20 MG TABLET: 20 | 30 days supply | Qty: 30 | Fill #2

## 2020-07-04 ENCOUNTER — Ambulatory Visit (HOSPITAL_COMMUNITY)
Admission: EM | Admit: 2020-07-04 | Discharge: 2020-07-04 | Disposition: A | Discharge status: Home / Self Care | Payer: Self-pay

## 2020-07-04 ENCOUNTER — Emergency Department (HOSPITAL_COMMUNITY)
Admission: EM | Admit: 2020-07-04 | Discharge: 2020-07-05 | Disposition: A | Discharge status: Home / Self Care | Payer: Self-pay | Attending: Emergency Medicine | Admitting: Emergency Medicine

## 2020-07-04 ENCOUNTER — Encounter (HOSPITAL_COMMUNITY): Payer: Self-pay | Admitting: Emergency Medicine

## 2020-07-04 ENCOUNTER — Other Ambulatory Visit: Payer: Self-pay

## 2020-07-04 DIAGNOSIS — Z7982 Long term (current) use of aspirin: Secondary | ICD-10-CM | POA: Insufficient documentation

## 2020-07-04 DIAGNOSIS — Z794 Long term (current) use of insulin: Secondary | ICD-10-CM | POA: Insufficient documentation

## 2020-07-04 DIAGNOSIS — Z87891 Personal history of nicotine dependence: Secondary | ICD-10-CM | POA: Insufficient documentation

## 2020-07-04 DIAGNOSIS — M25512 Pain in left shoulder: Secondary | ICD-10-CM | POA: Insufficient documentation

## 2020-07-04 DIAGNOSIS — Z79899 Other long term (current) drug therapy: Secondary | ICD-10-CM | POA: Insufficient documentation

## 2020-07-04 DIAGNOSIS — W050XXA Fall from non-moving wheelchair, initial encounter: Secondary | ICD-10-CM | POA: Insufficient documentation

## 2020-07-04 DIAGNOSIS — I10 Essential (primary) hypertension: Secondary | ICD-10-CM | POA: Insufficient documentation

## 2020-07-04 DIAGNOSIS — S2242XA Multiple fractures of ribs, left side, initial encounter for closed fracture: Secondary | ICD-10-CM | POA: Insufficient documentation

## 2020-07-04 DIAGNOSIS — E1165 Type 2 diabetes mellitus with hyperglycemia: Secondary | ICD-10-CM | POA: Insufficient documentation

## 2020-07-04 NOTE — ED Notes (Signed)
Patient is being discharged from the [facility]gent [facility] and sent to the Emergency Department via wheelchair by staff. Per Wallis Bamberg, PA-C, patient is stable but in need of higher level of care due to multiple injuries due to fall, needs CT. Patient is aware and verbalizes understanding of plan of care.  Vitals:   07/04/20 1658  BP: 138/84  Pulse: 86  Resp: 16  Temp: 98.2 F (36.8 C)  SpO2: 100%

## 2020-07-04 NOTE — ED Triage Notes (Signed)
Per Stratus:   Pt fell out of wheelchair yesterday that was on incline and not locked.  He fell into a concrete wall.  C/o L shoulder and L rib pain with intermittent SOB.  Denies LOC.

## 2020-07-04 NOTE — ED Triage Notes (Addendum)
Using Spanish Interpreter: Pt c/o fall yesterday from wheelchair outside on the cement, he states he is having trouble taking a deep breath and his chest hurts. Pt states he also has left shoulder, left elbow and wrist pain. Pt states he is currently SOB.

## 2020-07-05 ENCOUNTER — Telehealth (HOSPITAL_COMMUNITY): Payer: Self-pay | Admitting: Emergency Medicine

## 2020-07-05 ENCOUNTER — Emergency Department (HOSPITAL_COMMUNITY): Payer: Self-pay

## 2020-07-05 MED ORDER — HYDROCODONE-ACETAMINOPHEN 5-325 MG PO TABS
1.0000 | ORAL_TABLET | ORAL | 0 refills | Status: DC | PRN
Start: 2020-07-05 — End: 2020-07-05

## 2020-07-05 MED ORDER — HYDROCODONE-ACETAMINOPHEN 5-325 MG PO TABS: 1.0000 | ORAL_TABLET | ORAL | 0 refills | Status: DC | PRN

## 2020-07-05 NOTE — ED Provider Notes (Signed)
[facility] EMERGENCY DEPARTMENT Provider Note   CSN: [id number] Arrival date & time: 07/04/20  1755   History Chief Complaint  Patient presents with  . Fall  . Shoulder Pain  . rib pain    John Meza is a 59 y.o. male.  The history is provided by the patient. A language interpreter was used.  Fall  Shoulder Pain Has history of hypertension, diabetes, paraplegia and comes in after falling out of his wheelchair and hitting his left shoulder on the concrete.  He is complaining of pain in the left shoulder and left rib cage.  He denies other injury.  He denies dyspnea.  Past Medical History:  Diagnosis Date  . Cellulitis and abscess of buttock 10/2016  . Diabetes mellitus   . Hypertension   . Paraplegia (HCC) 2013   fell from ladder  . SCI (spinal cord injury)   . Spine fracture 11/16/2011   T 11- T9-L1    Patient Active Problem List   Diagnosis Date Noted  . Pressure injury of skin 11/04/2016  . Cellulitis of right buttock 11/03/2016  . Uncontrolled type 2 diabetes mellitus with hyperglycemia (HCC) 11/03/2016  . AP (abdominal pain)   . Sinus tachycardia   . Blood poisoning   . Diabetes type 2, uncontrolled (HCC)   . Hyponatremia   . Essential hypertension   . Choledocholithiasis   . Hypomagnesemia   . Hypokalemia   . Epigastric pain   . Acute cholecystitis   . Biliary tract imaging abnormality   . Obstructive jaundice 04/30/2015  . Sepsis (HCC) 04/30/2015  . Cholecystitis 04/30/2015  . Rotator cuff tear, left 08/12/2012  . Neurogenic bladder 01/06/2012  . Neurogenic bowel 01/06/2012  . SCI (spinal cord injury) 11/20/2011  . 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 Concussion with brief LOC 11/16/2011  . Accidental fall from ladder 11/16/2011  . DM, UNCOMPLICATED, TYPE II, UNCONTROLLED 05/26/2007  . OBESITY, MODERATE 05/26/2007  . HLD (hyperlipidemia) 05/25/2007  . DYSPEPSIA 05/25/2007    Past Surgical History:  Procedure Laterality  Date  . CHOLECYSTECTOMY N/A 05/01/2015   Procedure: LAPAROSCOPIC CHOLECYSTECTOMY WITH ATTEMPTED INTRAOPERATIVE CHOLANGIOGRAM;  Surgeon: Violeta Gelinas, MD;  Location: [facility] OR;  Service: General;  Laterality: N/A;  . SPINE SURGERY         Family History  Problem Relation Age of Onset  . Diabetes Father   . Diabetes Sister     Social History   Tobacco Use  . Smoking status: Former Smoker    Packs/day: 1.00    Years: 5.00    Pack years: 5.00    Types: Cigarettes    Quit date: 10/13/2011    Years since quitting: 8.7  . Smokeless tobacco: Never Used  . Tobacco comment: 03-24-19 per pt he stopped 1 mo ago   Vaping Use  . Vaping Use: Never used  Substance Use Topics  . Alcohol use: Yes    Alcohol/week: 1.0 standard drink    Types: 1 Cans of beer per week  . Drug use: No    Home Medications Prior to Admission medications   Medication Sig Start Date End Date Taking? Authorizing Provider  aspirin EC 81 MG tablet Take 1 tablet (81 mg total) by mouth daily. Take after eating 10/31/18   Fulp, Cammie, MD  atorvastatin (LIPITOR) 20 MG tablet Take 1 tablet (20 mg total) by mouth daily. To lower cholesterol 04/18/20   Rema Fendt, NP  fosfomycin (MONUROL) 3 g PACK Take 3 g dose  by mouth every 3 days for 3 doses Patient not taking: Reported on 12/28/2019 12/13/19   Fulp, Hewitt Shorts, MD  glimepiride (AMARYL) 4 MG tablet Take 1 tablet (4 mg total) by mouth daily with breakfast. To lower blood sugar 04/18/20   Rema Fendt, NP  Insulin Glargine [facility] KWIKPEN) 100 UNIT/ML Inject 10 units into the skin daily to lower blood sugar 04/18/20   Rema Fendt, NP  Insulin Pen Needle (TRUEPLUS PEN NEEDLES) 32G X 4 MM MISC Use as directed to inject insulin 04/18/20   Rema Fendt, NP  levofloxacin (LEVAQUIN) 500 MG tablet Take 1 tablet (500 mg total) by mouth daily. Patient not taking: Reported on 12/28/2019 09/19/19   Fulp, Hewitt Shorts, MD  lisinopril (ZESTRIL) 20 MG tablet Take 1 tablet (20 mg total) by mouth  daily. To lower blood pressure 04/18/20   Rema Fendt, NP  loratadine (CLARITIN) 10 MG tablet Take 1 tablet (10 mg total) by mouth daily. As needed for itchy throat/allergy symptoms Patient not taking: Reported on 03/24/2019 12/26/18   Cain Saupe, MD  metFORMIN (GLUCOPHAGE) 1000 MG tablet Take 1 tablet (1,000 mg total) by mouth 2 (two) times daily with a meal. 04/18/20   Rema Fendt, NP  omeprazole (PRILOSEC) 20 MG capsule TAKE 1 CAPSULE (20 MG TOTAL) BY MOUTH DAILY. TO REDUCE STOMACH ACID 04/12/20   Fulp, Cammie, MD  sitaGLIPtin (JANUVIA) 100 MG tablet Take 1 tablet (100 mg total) by mouth daily. 05/21/20   Fulp, Hewitt Shorts, MD    Allergies    Macrobid [nitrofurantoin] and Other  Review of Systems   Review of Systems  All other systems reviewed and are negative.   Physical Exam Updated Vital Signs BP (!) 142/80 (BP Location: Right Arm)   Pulse 78   Temp 98.6 F (37 C) (Oral)   Resp 18   SpO2 100%   Physical Exam Vitals and nursing note reviewed.   59 year old male, resting comfortably and in no acute distress. Vital signs are significant for borderline elevated blood pressure. Oxygen saturation is 100%, which is normal. Head is normocephalic and atraumatic. PERRLA, EOMI. Oropharynx is clear. Neck is nontender and supple without adenopathy or JVD. Back is nontender and there is no CVA tenderness. Lungs are clear without rales, wheezes, or rhonchi. Chest is moderately tender in the left lateral chest wall without crepitus. Heart has regular rate and rhythm without murmur. Abdomen is soft, flat, nontender without masses or hepatosplenomegaly and peristalsis is normoactive. Extremities have no cyanosis or edema, full range of motion is present.  No tenderness to palpation on the left shoulder, no limitation on range of motion.  Distal neurovascular exam is intact. Skin is warm and dry without rash. Neurologic: Mental status is normal, cranial nerves are intact.  Strength is 5/5 in  both arms.  Paraplegia present.   ED Results / Procedures / Treatments     EKG EKG Interpretation  Date/Time:  Thursday July 04 2020 17:58:10 EDT Ventricular Rate:  79 PR Interval:  124 QRS Duration: 72 QT Interval:  362 QTC Calculation: 415 R Axis:   0 Text Interpretation: Normal sinus rhythm Minimal voltage criteria for LVH, may be normal variant ( R in aVL ) Borderline ECG When compared with ECG of 04/29/2015, HEART RATE has decreased Confirmed by Dione Booze ([id number]) on 07/05/2020 12:07:47 AM   Radiology DG Ribs Unilateral W/Chest Left  Result Date: 07/05/2020 CLINICAL DATA:  Recent fall with left-sided chest pain EXAM: LEFT RIBS AND  CHEST - 3+ VIEW COMPARISON:  None. FINDINGS: Cardiac shadow is within normal limits. Postsurgical changes in the thoracolumbar spine are seen. There are mildly displaced fractures of the left fourth and fifth ribs laterally. No other definitive rib fractures are seen. No pneumothorax is noted. IMPRESSION: Left fourth and fifth rib fractures without complicating factors. Electronically Signed   By: Alcide Clever M.D.   On: 07/05/2020 02:48   DG Shoulder Left  Result Date: 07/05/2020 CLINICAL DATA:  Recent fall with left shoulder pain, initial encounter EXAM: LEFT SHOULDER - 2+ VIEW COMPARISON:  None. FINDINGS: Degenerative changes of the acromioclavicular joint are noted. The humeral head is well seated. Fractures of the left fourth and fifth ribs are noted without complicating factors. No soft tissue abnormality is seen. IMPRESSION: Left fourth and fifth rib fractures without complicating factors. Degenerative changes of the shoulder joint. Electronically Signed   By: Alcide Clever M.D.   On: 07/05/2020 02:47    Procedures Procedures  Medications Ordered in ED Medications - No data to display  ED Course  I have reviewed the triage vital signs and the nursing notes.  Pertinent labs & imaging results that were available during my care of the  patient were reviewed by me and considered in my medical decision making (see chart for details).  MDM Rules/Calculators/A&P Fall with injury to left shoulder and left rib cage.  X-rays show no fracture of the left shoulder, but fractures of the left fourth and fifth ribs.  No underlying pulmonary contusion or pneumothorax.  He is discharged with instructions to apply ice, use over-the-counter analgesics as needed for pain.  Given prescription for small number of hydrocodone-acetaminophen to use as needed for more severe pain.  Old records are reviewed, and he has no relevant past visits.  Final Clinical Impression(s) / ED Diagnoses Final diagnoses:  Fall from wheelchair, initial encounter  Closed fracture of two ribs of left side, initial encounter    Rx / DC Orders ED Discharge Orders         Ordered    HYDROcodone-acetaminophen (NORCO) 5-325 MG tablet  Every 4 hours PRN        07/05/20 0300           Dione Booze, MD 07/05/20 0302

## 2020-07-05 NOTE — Discharge Instructions (Addendum)
Aplique hielo durante treinta minutos a la vez, cuatro veces al [employer].  Tome acetaminphen o ibuprofen segn sea necesario para el dolor, tome hydrocodone-acetaminophen para el dolor intenso.  Varias veces al da, respire profundamente de 4 a 5 veces. Esto es para prevenir que desarrolle neumona.

## 2020-07-05 NOTE — Telephone Encounter (Signed)
Hydrocodone prescribed by Dr. Preston Fleeting initially.  Sent to pharmacy that does not do narcotics however.  Sent to different pharmacy under patient's request.

## 2020-07-11 MED FILL — metFORMIN HCL 1000 MG TABS: 1000 | 30 days supply | Qty: 60 | Fill #4

## 2020-07-11 MED FILL — JANUVIA 100 MG TABLET: 100 | 30 days supply | Qty: 30 | Fill #1

## 2020-07-11 MED FILL — LISINOPRIL 20 MG TABLET: 20 | 30 days supply | Qty: 30 | Fill #5

## 2020-07-11 MED FILL — GLIMEPIRIDE 4 MG TABS: 4 | 30 days supply | Qty: 30 | Fill #3

## 2020-07-11 MED FILL — ATORVASTATIN CALCIUM 20 MG: 20 | 30 days supply | Qty: 30 | Fill #3

## 2020-07-15 ENCOUNTER — Other Ambulatory Visit: Payer: Self-pay | Admitting: Pharmacist

## 2020-07-15 MED ORDER — SITAGLIPTIN PHOSPHATE 100 MG PO TABS: 100.0000 mg | ORAL_TABLET | Freq: Every day | ORAL | 0 refills | Status: DC

## 2020-07-18 MED FILL — ?BASAGLAR 100 UNITS/ML KWPE: 100 | 28 days supply | Qty: 3 | Fill #6

## 2020-08-12 MED FILL — GLIMEPIRIDE 4 MG TABS: 4 | 30 days supply | Qty: 30 | Fill #4

## 2020-08-12 MED FILL — $JANUVIA 100 MG TABLET: 100 | 30 days supply | Qty: 30 | Fill #2

## 2020-08-12 MED FILL — LISINOPRIL 20 MG TABLET: 20 | 30 days supply | Qty: 30 | Fill #6

## 2020-08-12 MED FILL — ?BASAGLAR 100 UNITS/ML KWPE: 100 | 28 days supply | Qty: 3 | Fill #7

## 2020-08-12 MED FILL — ATORVASTATIN CALCIUM 20 MG: 20 | 30 days supply | Qty: 30 | Fill #4

## 2020-08-12 MED FILL — ?OMEPRAZOLE 20 MG CPDR: 20 | 30 days supply | Qty: 30 | Fill #1

## 2020-08-12 MED FILL — METFORMIN HCL 1000 MG TABS: 1000 | 30 days supply | Qty: 60 | Fill #5

## 2020-08-19 ENCOUNTER — Ambulatory Visit: Payer: Self-pay | Attending: Family Medicine | Admitting: Family Medicine

## 2020-08-19 ENCOUNTER — Other Ambulatory Visit: Payer: Self-pay

## 2020-08-19 ENCOUNTER — Other Ambulatory Visit: Payer: Self-pay | Admitting: Family Medicine

## 2020-08-19 ENCOUNTER — Encounter: Payer: Self-pay | Admitting: Family Medicine

## 2020-08-19 VITALS — BP 135/83

## 2020-08-19 DIAGNOSIS — S2242XS Multiple fractures of ribs, left side, sequela: Secondary | ICD-10-CM

## 2020-08-19 DIAGNOSIS — E11649 Type 2 diabetes mellitus with hypoglycemia without coma: Secondary | ICD-10-CM

## 2020-08-19 DIAGNOSIS — E785 Hyperlipidemia, unspecified: Secondary | ICD-10-CM

## 2020-08-19 DIAGNOSIS — Z23 Encounter for immunization: Secondary | ICD-10-CM

## 2020-08-19 DIAGNOSIS — Z603 Acculturation difficulty: Secondary | ICD-10-CM

## 2020-08-19 DIAGNOSIS — I10 Essential (primary) hypertension: Secondary | ICD-10-CM

## 2020-08-19 DIAGNOSIS — Z789 Other specified health status: Secondary | ICD-10-CM

## 2020-08-19 DIAGNOSIS — E1165 Type 2 diabetes mellitus with hyperglycemia: Secondary | ICD-10-CM

## 2020-08-19 DIAGNOSIS — S2242XA Multiple fractures of ribs, left side, initial encounter for closed fracture: Secondary | ICD-10-CM

## 2020-08-19 DIAGNOSIS — Z7982 Long term (current) use of aspirin: Secondary | ICD-10-CM

## 2020-08-19 DIAGNOSIS — G8220 Paraplegia, unspecified: Secondary | ICD-10-CM

## 2020-08-19 DIAGNOSIS — E1169 Type 2 diabetes mellitus with other specified complication: Secondary | ICD-10-CM

## 2020-08-19 DIAGNOSIS — Z758 Other problems related to medical facilities and other health care: Secondary | ICD-10-CM

## 2020-08-19 LAB — POCT GLYCOSYLATED HEMOGLOBIN (HGB A1C): Hemoglobin A1C: 7.8 % — AB (ref 4.0–5.6)

## 2020-08-19 LAB — GLUCOSE, POCT (MANUAL RESULT ENTRY): POC Glucose: 102 mg/dL — AB (ref 70–99)

## 2020-08-19 MED ORDER — LISINOPRIL 20 MG PO TABS: 20.0000 mg | ORAL_TABLET | Freq: Every day | ORAL | 2 refills | Status: DC

## 2020-08-19 MED ORDER — METFORMIN HCL 1000 MG PO TABS: 1000.0000 mg | ORAL_TABLET | Freq: Two times a day (BID) | ORAL | 3 refills | Status: DC

## 2020-08-19 MED ORDER — GLIMEPIRIDE 4 MG PO TABS: 4.0000 mg | ORAL_TABLET | Freq: Every day | ORAL | 3 refills | Status: DC

## 2020-08-19 MED ORDER — SITAGLIPTIN PHOSPHATE 100 MG PO TABS: 100.0000 mg | ORAL_TABLET | Freq: Every day | ORAL | 3 refills | Status: DC

## 2020-08-19 MED ORDER — ASPIRIN EC 81 MG PO TBEC: 81.0000 mg | DELAYED_RELEASE_TABLET | Freq: Every day | ORAL | 3 refills | Status: DC

## 2020-08-19 MED ORDER — ATORVASTATIN CALCIUM 20 MG PO TABS: 20.0000 mg | ORAL_TABLET | Freq: Every day | ORAL | 3 refills | Status: DC

## 2020-08-19 MED ORDER — OMEPRAZOLE 20 MG PO CPDR: 20.0000 mg | DELAYED_RELEASE_CAPSULE | Freq: Every day | ORAL | 3 refills | Status: DC

## 2020-08-19 NOTE — Progress Notes (Signed)
FOLLOW UP

## 2020-08-19 NOTE — Progress Notes (Signed)
Established Patient Office Visit  Subjective:  Patient ID: John Meza, male    DOB: 19-Dec-1960  Age: 59 y.o. MRN: [id number]  Due to language barrier, video interpretation system used at today's visit  CC:  Chief Complaint  Patient presents with  . Follow-up    HPI John Meza, 59 year old male, who is seen in follow-up of chronic medical issues including type 2 diabetes, recurrent urinary tract infection secondary to neurogenic bladder due to paraplegia, hypertension, hyperlipidemia and long-term aspirin use.  He reports that his blood sugars range between 110-135  fasting and sometimes slightly higher depending on what he eats.  He is not sure which medications need to be refilled therefore he would like to have all of his current medications refilled at today's visit.  He denies any issues with increased thirst.  He has seen urology and reports that he was told that he did not need to follow-up for 12 months.  He denies any increased odor to the urine, no increased cloudiness to the urine, no low back or lower abdominal discomfort or chills which tend to occur with prior urinary tract infections.  He has been taking all of his medications on a daily basis.  He denies any headaches or dizziness related to his blood pressure.  He denies any issues with skin breakdown related to his paraplegia.  He denies any increased muscle or joint discomfort related to use of cholesterol medicine.  He did have emergency department visit in September due to a fall from his wheelchair and he reports no current issues with left shoulder pain and no chest wall/rib pain.  Overall, he feels he is doing well.  He is usually accompanied by his wife however he states that she is in the car as they have their 35-year-old granddaughter with them today.  Past Medical History:  Diagnosis Date  . Cellulitis and abscess of buttock 10/2016  . Diabetes mellitus   . Hypertension   . Paraplegia (HCC) 2013    fell from ladder  . SCI (spinal cord injury)   . Spine fracture 11/16/2011   T 11- T9-L1    Past Surgical History:  Procedure Laterality Date  . CHOLECYSTECTOMY N/A 05/01/2015   Procedure: LAPAROSCOPIC CHOLECYSTECTOMY WITH ATTEMPTED INTRAOPERATIVE CHOLANGIOGRAM;  Surgeon: Violeta Gelinas, MD;  Location: [facility] OR;  Service: General;  Laterality: N/A;  . SPINE SURGERY      Family History  Problem Relation Age of Onset  . Diabetes Father   . Diabetes Sister     Social History   Socioeconomic History  . Marital status: Married    Spouse name: Not on file  . Number of children: Not on file  . Years of education: Not on file  . Highest education level: Not on file  Occupational History  . Not on file  Tobacco Use  . Smoking status: Former Smoker    Packs/day: 1.00    Years: 5.00    Pack years: 5.00    Types: Cigarettes    Quit date: 10/13/2011    Years since quitting: 8.8  . Smokeless tobacco: Never Used  . Tobacco comment: 03-24-19 per pt he stopped 1 mo ago   Vaping Use  . Vaping Use: Never used  Substance and Sexual Activity  . Alcohol use: Yes    Alcohol/week: 1.0 standard drink    Types: 1 Cans of beer per week  . Drug use: No  . Sexual activity: Not Currently  Other Topics Concern  . Not  on file  Social History Narrative  . Not on file   Social Determinants of Health   Financial Resource Strain:   . Difficulty of Paying Living Expenses: Not on file  Food Insecurity:   . Worried About Programme researcher, broadcasting/film/videounning Out of Food in the Last Year: Not on file  . Ran Out of Food in the Last Year: Not on file  Transportation Needs:   . Lack of Transportation (Medical): Not on file  . Lack of Transportation (Non-Medical): Not on file  Physical Activity:   . Days of Exercise per Week: Not on file  . Minutes of Exercise per Session: Not on file  Stress:   . Feeling of Stress : Not on file  Social Connections:   . Frequency of Communication with Friends and Family: Not on file  . Frequency  of Social Gatherings with Friends and Family: Not on file  . Attends Religious Services: Not on file  . Active Member of Clubs or Organizations: Not on file  . Attends BankerClub or Organization Meetings: Not on file  . Marital Status: Not on file  Intimate Partner Violence:   . Fear of Current or Ex-Partner: Not on file  . Emotionally Abused: Not on file  . Physically Abused: Not on file  . Sexually Abused: Not on file    Outpatient Medications Prior to Visit  Medication Sig Dispense Refill  . aspirin EC 81 MG tablet Take 1 tablet (81 mg total) by mouth daily. Take after eating 90 tablet 3  . atorvastatin (LIPITOR) 20 MG tablet Take 1 tablet (20 mg total) by mouth daily. To lower cholesterol 30 tablet 3  . glimepiride (AMARYL) 4 MG tablet Take 1 tablet (4 mg total) by mouth daily with breakfast. To lower blood sugar 30 tablet 3  . HYDROcodone-acetaminophen (NORCO/VICODIN) 5-325 MG tablet Take 1 tablet by mouth every 4 (four) hours as needed. 15 tablet 0  . Insulin Glargine (BASAGLAR KWIKPEN) 100 UNIT/ML Inject 10 units into the skin daily to lower blood sugar 15 mL 11  . Insulin Pen Needle (TRUEPLUS PEN NEEDLES) 32G X 4 MM MISC Use as directed to inject insulin 100 each prn  . lisinopril (ZESTRIL) 20 MG tablet Take 1 tablet (20 mg total) by mouth daily. To lower blood pressure 90 tablet 2  . metFORMIN (GLUCOPHAGE) 1000 MG tablet Take 1 tablet (1,000 mg total) by mouth 2 (two) times daily with a meal. 180 tablet 2  . omeprazole (PRILOSEC) 20 MG capsule TAKE 1 CAPSULE (20 MG TOTAL) BY MOUTH DAILY. TO REDUCE STOMACH ACID 30 capsule 3  . sitaGLIPtin (JANUVIA) 100 MG tablet Take 1 tablet (100 mg total) by mouth daily. 90 tablet 0   No facility-administered medications prior to visit.    Allergies  Allergen Reactions  . Macrobid [Nitrofurantoin]   . Other Itching, Rash and Other (See Comments)    ANTIBIOTIC? CAUSED RASH, ITCHING IN 2013-PER FAMILY Possibly niacin causing flushing reaction- as  described by family?    ROS Review of Systems  Constitutional: Negative for chills and fever.  HENT: Negative for sore throat and trouble swallowing.   Eyes: Negative for photophobia and visual disturbance.  Respiratory: Negative for cough and shortness of breath.   Cardiovascular: Negative for chest pain and palpitations.  Gastrointestinal: Positive for constipation (Has over-the-counter medication to take as needed). Negative for abdominal pain, diarrhea and nausea.  Endocrine: Negative for polydipsia, polyphagia and polyuria.  Genitourinary: Negative for discharge and dysuria.  Musculoskeletal: Positive for gait  problem. Negative for arthralgias.  Skin: Negative for rash and wound.  Neurological: Negative for dizziness and headaches.  Hematological: Negative for adenopathy. Does not bruise/bleed easily.  Psychiatric/Behavioral: Negative for suicidal ideas. The patient is not nervous/anxious.       Objective:    Physical Exam Vitals and nursing note reviewed.  Constitutional:      General: He is not in acute distress.    Appearance: Normal appearance.     Comments: Well-nourished well-developed overweight appearing older male in no acute distress sitting in a manual wheelchair  Neck:     Vascular: No carotid bruit.  Cardiovascular:     Rate and Rhythm: Normal rate and regular rhythm.  Pulmonary:     Effort: Pulmonary effort is normal.     Breath sounds: Normal breath sounds.  Abdominal:     Palpations: Abdomen is soft.     Tenderness: There is no abdominal tenderness. There is no guarding or rebound.  Musculoskeletal:     Cervical back: Normal range of motion and neck supple. No tenderness.     Comments: Mild, nonpitting distal lower extremity edema  Lymphadenopathy:     Cervical: No cervical adenopathy.  Skin:    General: Skin is warm and dry.  Neurological:     Mental Status: He is alert and oriented to person, place, and time. Mental status is at baseline.    Psychiatric:        Mood and Affect: Mood normal.        Behavior: Behavior normal.     BP 135/83 (BP Location: Right Arm, Patient Position: Sitting)  Wt Readings from Last 3 Encounters:  12/17/17 169 lb (76.7 kg)  11/03/16 169 lb 8 oz (76.9 kg)  12/30/15 160 lb (72.6 kg)     Health Maintenance Due  Topic Date Due  . OPHTHALMOLOGY EXAM  Never done  . COVID-19 Vaccine (1) Never done  . HIV Screening  Never done  . COLONOSCOPY  Never done  . FOOT EXAM  11/01/2019  . INFLUENZA VACCINE  05/12/2020   Patient was offered and agreed to have influenza immunization at today's visit.  Patient does not require monofilament exams of the feet due to his paraplegia.   Lab Results  Component Value Date   TSH 3.980 07/01/2018   Lab Results  Component Value Date   WBC 5.4 05/20/2020   HGB 13.0 05/20/2020   HCT 39.7 05/20/2020   MCV 90 05/20/2020   PLT 224 08/30/2019   Lab Results  Component Value Date   NA 140 05/20/2020   K 5.1 05/20/2020   CO2 21 05/20/2020   GLUCOSE 139 (H) 05/20/2020   BUN 12 05/20/2020   CREATININE 0.67 (L) 05/20/2020   BILITOT 0.3 12/28/2019   ALKPHOS 92 12/28/2019   AST 16 12/28/2019   ALT 15 12/28/2019   PROT 7.1 12/28/2019   ALBUMIN 4.3 12/28/2019   CALCIUM 9.8 05/20/2020   ANIONGAP 11 12/17/2017   Lab Results  Component Value Date   CHOL 110 12/28/2019   Lab Results  Component Value Date   HDL 40 12/28/2019   Lab Results  Component Value Date   LDLCALC 50 12/28/2019   Lab Results  Component Value Date   TRIG 110 12/28/2019   Lab Results  Component Value Date   CHOLHDL 2.8 12/28/2019   Lab Results  Component Value Date   HGBA1C 8.3 (H) 05/20/2020      Assessment & Plan:  1. Uncontrolled type 2 diabetes  mellitus with hyperglycemia (HCC) Patient with hemoglobin A1c of 8.3 done on 05/20/2020 and hemoglobin A1c at today's visit is improved at 7.8.  He will continue his current medications as well as a low carbohydrate diet and  continue to monitor blood sugars.  Refills provided of Glucophage and Amaryl. - POCT glucose (manual entry)  - POCT glycosylated hemoglobin (Hb A1C) - metFORMIN (GLUCOPHAGE) 1000 MG tablet; Take 1 tablet (1,000 mg total) by mouth 2 (two) times daily with a meal.  Dispense: 180 tablet; Refill: 3 - glimepiride (AMARYL) 4 MG tablet; Take 1 tablet (4 mg total) by mouth daily with breakfast. To lower blood sugar  Dispense: 90 tablet; Refill: 3  2. Closed fracture of multiple ribs of left side On review of chart, patient is status post emergency department visit on 07/04/2020 due to a fall from his wheelchair.  Patient had imaging showing mildly displaced fractures of the left fourth and fifth ribs laterally.  He reports no current left-sided chest pain and there is no reproducible pain with palpation in the area of reported rib fractures on today's exam.  3. Need for immunization against influenza Patient was offered and agreed to have influenza immunization at today's visit which was provided.  He was also given educational material regarding influenza immunization in [place]. - Flu Vaccine QUAD 36+ mos IM  4. Paraplegia following spinal cord injury (HCC); 7. long-term use of aspirin therapy Patient with paraplegia following spinal cord injury after a fall from a roof while working.  He reports no issues with skin breakdown and does have a seat cushion in his wheelchair at today's visit.  He is on long-term aspirin therapy to help reduce the risk of DVT and is provided with refill of aspirin and omeprazole for stomach protection.  Patient with normal CBC done 05/20/2020 on review of chart. - omeprazole (PRILOSEC) 20 MG capsule; Take 1 capsule (20 mg total) by mouth daily. To reduce stomach acid  Dispense: 90 capsule; Refill: 3 - aspirin EC 81 MG tablet; Take 1 tablet (81 mg total) by mouth daily. Take after eating  Dispense: 90 tablet; Refill: 3  5. Essential hypertension Blood pressure stable and at  goal.  Continue lisinopril 20 mg daily.  New prescription provided. - lisinopril (ZESTRIL) 20 MG tablet; Take 1 tablet (20 mg total) by mouth daily. To lower blood pressure  Dispense: 90 tablet; Refill: 2  6. Hyperlipidemia associated with type 2 diabetes mellitus (HCC) Refill provided of atorvastatin 20 mg daily for hyperlipidemia treatment.  Patient's blood work from 05/20/2020 reviewed. - atorvastatin (LIPITOR) 20 MG tablet; Take 1 tablet (20 mg total) by mouth daily. To lower cholesterol  Dispense: 90 tablet; Refill: 3  8.  Language barrier Video interpretation system used at today's visit to help of language barrier   Follow-up: Return in about 5 months (around 01/17/2021) for DM/chronic issues; sooner if needed.    Cain Saupe, MD

## 2020-09-10 MED FILL — GLIMEPIRIDE 4 MG TABS: 4 | 30 days supply | Qty: 30 | Fill #5

## 2020-09-10 MED FILL — ?BASAGLAR 100 UNITS/ML KWPE: 100 | 28 days supply | Qty: 3 | Fill #8

## 2020-09-10 MED FILL — METFORMIN HCL 1000 MG TABS: 1000 | 30 days supply | Qty: 60 | Fill #6

## 2020-09-10 MED FILL — ?ATORVASTATIN 20 MG TABLET: 20 | 30 days supply | Qty: 30 | Fill #5

## 2020-09-10 MED FILL — ?OMEPRAZOLE 20 MG CPDR: 20 | 30 days supply | Qty: 30 | Fill #2

## 2020-09-10 MED FILL — LISINOPRIL 20 MG TABLET: 20 | 30 days supply | Qty: 30 | Fill #7

## 2020-09-10 MED FILL — $JANUVIA 100 MG TABLET: 100 | 30 days supply | Qty: 30 | Fill #0

## 2020-09-26 ENCOUNTER — Ambulatory Visit: Payer: Self-pay

## 2020-09-27 ENCOUNTER — Ambulatory Visit: Payer: Self-pay | Attending: Family Medicine

## 2020-09-27 ENCOUNTER — Other Ambulatory Visit: Payer: Self-pay

## 2020-10-10 MED FILL — $JANUVIA 100 MG TABLET: 100 | 30 days supply | Qty: 30 | Fill #1

## 2020-10-10 MED FILL — METFORMIN HCL 1000 MG TABS: 1000 | 30 days supply | Qty: 60 | Fill #7

## 2020-10-10 MED FILL — ?BASAGLAR 100 UNITS/ML KWPE: 100 | 28 days supply | Qty: 3 | Fill #9

## 2020-10-10 MED FILL — GLIMEPIRIDE 4 MG TABS: 4 | 30 days supply | Qty: 30 | Fill #6

## 2020-10-10 MED FILL — LISINOPRIL 20 MG TABLET: 20 | 30 days supply | Qty: 30 | Fill #8

## 2020-10-10 MED FILL — ?ATORVASTATIN 20 MG TABLET: 20 | 30 days supply | Qty: 30 | Fill #6

## 2020-10-10 MED FILL — ?OMEPRAZOLE 20 MG CPDR: 20 | 30 days supply | Qty: 30 | Fill #3

## 2020-10-21 ENCOUNTER — Encounter (HOSPITAL_COMMUNITY): Payer: Self-pay

## 2020-10-21 ENCOUNTER — Ambulatory Visit (HOSPITAL_COMMUNITY)
Admission: EM | Admit: 2020-10-21 | Discharge: 2020-10-21 | Disposition: A | Discharge status: Home / Self Care | Payer: Self-pay | Attending: Student | Admitting: Student

## 2020-10-21 ENCOUNTER — Other Ambulatory Visit: Payer: Self-pay | Admitting: Student

## 2020-10-21 ENCOUNTER — Other Ambulatory Visit: Payer: Self-pay

## 2020-10-21 DIAGNOSIS — E669 Obesity, unspecified: Secondary | ICD-10-CM | POA: Insufficient documentation

## 2020-10-21 DIAGNOSIS — Z993 Dependence on wheelchair: Secondary | ICD-10-CM | POA: Insufficient documentation

## 2020-10-21 DIAGNOSIS — J069 Acute upper respiratory infection, unspecified: Secondary | ICD-10-CM | POA: Insufficient documentation

## 2020-10-21 DIAGNOSIS — Z87891 Personal history of nicotine dependence: Secondary | ICD-10-CM | POA: Insufficient documentation

## 2020-10-21 DIAGNOSIS — N3001 Acute cystitis with hematuria: Secondary | ICD-10-CM | POA: Insufficient documentation

## 2020-10-21 DIAGNOSIS — G8220 Paraplegia, unspecified: Secondary | ICD-10-CM | POA: Insufficient documentation

## 2020-10-21 DIAGNOSIS — U071 COVID-19: Secondary | ICD-10-CM | POA: Insufficient documentation

## 2020-10-21 LAB — POCT URINALYSIS DIPSTICK, ED / UC
Bilirubin Urine: NEGATIVE
Glucose, UA: NEGATIVE mg/dL
Ketones, ur: NEGATIVE mg/dL
Nitrite: NEGATIVE
Protein, ur: >=300 mg/dL — AB
Specific Gravity, Urine: >=1.03 (ref 1.005–1.030)
Urobilinogen, UA: 0.2 mg/dL (ref 0.0–1.0)
pH: 5 (ref 5.0–8.0)

## 2020-10-21 MED ORDER — CIPROFLOXACIN HCL 500 MG PO TABS: 500.0000 mg | ORAL_TABLET | Freq: Two times a day (BID) | ORAL | 0 refills | Status: DC

## 2020-10-21 MED ORDER — BENZONATATE 100 MG PO CAPS: 100.0000 mg | ORAL_CAPSULE | Freq: Three times a day (TID) | ORAL | 0 refills | Status: DC

## 2020-10-21 MED ORDER — PROMETHAZINE-DM 6.25-15 MG/5ML PO SYRP: 5.0000 mL | ORAL_SOLUTION | Freq: Four times a day (QID) | ORAL | 0 refills | Status: DC | PRN

## 2020-10-21 MED ORDER — CIPROFLOXACIN HCL 500 MG PO TABS: 500.0000 mg | ORAL_TABLET | Freq: Two times a day (BID) | ORAL | Status: DC

## 2020-10-21 NOTE — Discharge Instructions (Addendum)
-  Ciprofloxacin twice daily x7 days -We'll send a urine culture and call you if we need to change the antibiotic -We'll call you if the Covid test is positive.

## 2020-10-21 NOTE — ED Provider Notes (Signed)
[facility]    CSN: [id number] Arrival date & time: 10/21/20  1611      History   Chief Complaint Chief Complaint  Patient presents with  . Chills  . Cough    HPI John Meza is a 60 y.o. male Presenting for URI symptoms for 4 days. History of diabetes, hypertension, paraplegia (t11). Presenting with chills, sore throat, cough. Denies n/v/d, shortness of breath, chest pain, congestion, facial pain, teeth pain, headaches, sore throat, loss of taste/smell, swollen lymph nodes, ear pain.  Denies chest pain, shortness of breath, confusion, high fevers.   Also endorses yellow penile "discharge" x1 week and smelly urine. Describes discharge as white specks in the urine. He is wheelchair bound and uses diaper/catheter, so it'd difficult to say whether this is truly discharge. He denies STI risk and is monogamous with his wife of many years. Denies freqeuncy but again, uses catheter/diaper so this is hard to say.  HPI  Past Medical History:  Diagnosis Date  . Cellulitis and abscess of buttock 10/2016  . Diabetes mellitus   . Hypertension   . Paraplegia (HCC) 2013   fell from ladder  . SCI (spinal cord injury)   . Spine fracture 11/16/2011   T 11- T9-L1    Patient Active Problem List   Diagnosis Date Noted  . Pressure injury of skin 11/04/2016  . Cellulitis of right buttock 11/03/2016  . Uncontrolled type 2 diabetes mellitus with hyperglycemia (HCC) 11/03/2016  . AP (abdominal pain)   . Sinus tachycardia   . Blood poisoning   . Diabetes type 2, uncontrolled (HCC)   . Hyponatremia   . Essential hypertension   . Choledocholithiasis   . Hypomagnesemia   . Hypokalemia   . Epigastric pain   . Acute cholecystitis   . Biliary tract imaging abnormality   . Obstructive jaundice 04/30/2015  . Sepsis (HCC) 04/30/2015  . Cholecystitis 04/30/2015  . Rotator cuff tear, left 08/12/2012  . Neurogenic bladder 01/06/2012  . Neurogenic bowel 01/06/2012  . SCI (spinal  cord injury) 11/20/2011  . 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 Concussion with brief LOC 11/16/2011  . Accidental fall from ladder 11/16/2011  . DM, UNCOMPLICATED, TYPE II, UNCONTROLLED 05/26/2007  . OBESITY, MODERATE 05/26/2007  . HLD (hyperlipidemia) 05/25/2007  . DYSPEPSIA 05/25/2007    Past Surgical History:  Procedure Laterality Date  . CHOLECYSTECTOMY N/A 05/01/2015   Procedure: LAPAROSCOPIC CHOLECYSTECTOMY WITH ATTEMPTED INTRAOPERATIVE CHOLANGIOGRAM;  Surgeon: Violeta Gelinas, MD;  Location: [facility] OR;  Service: General;  Laterality: N/A;  . SPINE SURGERY         Home Medications    Prior to Admission medications   Medication Sig Start Date End Date Taking? Authorizing Provider  benzonatate (TESSALON) 100 MG capsule Take 1 capsule (100 mg total) by mouth every 8 (eight) hours. 10/21/20  Yes Rhys Martini, PA-C  ciprofloxacin (CIPRO) 500 MG tablet Take 1 tablet (500 mg total) by mouth every 12 (twelve) hours. 10/21/20  Yes Rhys Martini, PA-C  promethazine-dextromethorphan (PROMETHAZINE-DM) 6.25-15 MG/5ML syrup Take 5 mLs by mouth 4 (four) times daily as needed for cough. 10/21/20  Yes Rhys Martini, PA-C  aspirin EC 81 MG tablet Take 1 tablet (81 mg total) by mouth daily. Take after eating 08/19/20   Fulp, Cammie, MD  atorvastatin (LIPITOR) 20 MG tablet Take 1 tablet (20 mg total) by mouth daily. To lower cholesterol 08/19/20   Fulp, Cammie, MD  glimepiride (AMARYL) 4 MG tablet Take 1 tablet (4  mg total) by mouth daily with breakfast. To lower blood sugar 08/19/20   Fulp, Cammie, MD  HYDROcodone-acetaminophen (NORCO/VICODIN) 5-325 MG tablet Take 1 tablet by mouth every 4 (four) hours as needed. 07/05/20   Benjiman Core, MD  Insulin Glargine [facility]) 100 UNIT/ML Inject 10 units into the skin daily to lower blood sugar 04/18/20   Rema Fendt, NP  Insulin Pen Needle (TRUEPLUS PEN NEEDLES) 32G X 4 MM MISC Use as directed to inject insulin 04/18/20   Rema Fendt, NP   lisinopril (ZESTRIL) 20 MG tablet Take 1 tablet (20 mg total) by mouth daily. To lower blood pressure 08/19/20   Fulp, Cammie, MD  metFORMIN (GLUCOPHAGE) 1000 MG tablet Take 1 tablet (1,000 mg total) by mouth 2 (two) times daily with a meal. 08/19/20   Fulp, Cammie, MD  omeprazole (PRILOSEC) 20 MG capsule Take 1 capsule (20 mg total) by mouth daily. To reduce stomach acid 08/19/20   Fulp, Cammie, MD  sitaGLIPtin (JANUVIA) 100 MG tablet Take 1 tablet (100 mg total) by mouth daily. 08/19/20   Fulp, Cammie, MD  loratadine (CLARITIN) 10 MG tablet Take 1 tablet (10 mg total) by mouth daily. As needed for itchy throat/allergy symptoms Patient not taking: Reported on 03/24/2019 12/26/18 07/05/20  Cain Saupe, MD    Family History Family History  Problem Relation Age of Onset  . Diabetes Father   . Diabetes Sister     Social History Social History   Tobacco Use  . Smoking status: Former Smoker    Packs/day: 1.00    Years: 5.00    Pack years: 5.00    Types: Cigarettes    Quit date: 10/13/2011    Years since quitting: 9.0  . Smokeless tobacco: Never Used  . Tobacco comment: 03-24-19 per pt he stopped 1 mo ago   Vaping Use  . Vaping Use: Never used  Substance Use Topics  . Alcohol use: Yes    Alcohol/week: 1.0 standard drink    Types: 1 Cans of beer per week  . Drug use: No     Allergies   Macrobid [nitrofurantoin] and Other   Review of Systems Review of Systems  Constitutional: Positive for chills. Negative for appetite change and fever.  HENT: Negative for congestion, ear pain, rhinorrhea, sinus pressure, sinus pain and sore throat.   Eyes: Negative for redness and visual disturbance.  Respiratory: Positive for cough. Negative for chest tightness, shortness of breath and wheezing.   Cardiovascular: Negative for chest pain and palpitations.  Gastrointestinal: Negative for abdominal pain, constipation, diarrhea, nausea and vomiting.  Genitourinary: Negative for decreased urine volume,  difficulty urinating, dysuria, flank pain, frequency, genital sores, hematuria, penile discharge, penile pain, penile swelling, scrotal swelling, testicular pain and urgency.  Musculoskeletal: Negative for myalgias.  Neurological: Negative for dizziness, weakness and headaches.  Psychiatric/Behavioral: Negative for confusion.  All other systems reviewed and are negative.    Physical Exam Triage Vital Signs ED Triage Vitals  Enc Vitals Group     BP      Pulse      Resp      Temp      Temp src      SpO2      Weight      Height      Head Circumference      Peak Flow      Pain Score      Pain Loc      Pain Edu?  Excl. in GC?    No data found.  Updated Vital Signs BP 136/75 (BP Location: Left Arm)   Pulse 98   Temp 99.1 F (37.3 C) (Oral)   Resp 18   SpO2 98%   Visual Acuity Right Eye Distance:   Left Eye Distance:   Bilateral Distance:    Right Eye Near:   Left Eye Near:    Bilateral Near:     Physical Exam Vitals reviewed.  Constitutional:      General: He is not in acute distress.    Appearance: Normal appearance. He is not ill-appearing.  HENT:     Head: Normocephalic and atraumatic.     Right Ear: Hearing, tympanic membrane, ear canal and external ear normal. No swelling or tenderness. There is no impacted cerumen. No mastoid tenderness. Tympanic membrane is not perforated, erythematous, retracted or bulging.     Left Ear: Hearing, tympanic membrane, ear canal and external ear normal. No swelling or tenderness. There is no impacted cerumen. No mastoid tenderness. Tympanic membrane is not perforated, erythematous, retracted or bulging.     Nose:     Right Sinus: No maxillary sinus tenderness or frontal sinus tenderness.     Left Sinus: No maxillary sinus tenderness or frontal sinus tenderness.     Mouth/Throat:     Mouth: Mucous membranes are moist.     Pharynx: Uvula midline. No oropharyngeal exudate or posterior oropharyngeal erythema.     Tonsils: No  tonsillar exudate.  Cardiovascular:     Rate and Rhythm: Normal rate and regular rhythm.     Heart sounds: Normal heart sounds.  Pulmonary:     Effort: Pulmonary effort is normal.     Breath sounds: Normal breath sounds and air entry. No wheezing, rhonchi or rales.  Chest:     Chest wall: No tenderness.  Abdominal:     General: Abdomen is flat. Bowel sounds are normal. There is no distension.     Palpations: Abdomen is soft. There is no mass.     Tenderness: There is no abdominal tenderness. There is no right CVA tenderness, left CVA tenderness, guarding or rebound.     Comments: Bowel sounds positive in all 4 quadrants. No tenderness to palpation. Negative Murphy Sign, Rovsing's sign, McBurney point tenderness.   Lymphadenopathy:     Cervical: No cervical adenopathy.  Neurological:     General: No focal deficit present.     Mental Status: He is alert and oriented to person, place, and time.  Psychiatric:        Attention and Perception: Attention and perception normal.        Mood and Affect: Mood and affect normal.        Behavior: Behavior normal. Behavior is cooperative.        Thought Content: Thought content normal.        Judgment: Judgment normal.      UC Treatments / Results  Labs (all labs ordered are listed, but only abnormal results are displayed) Labs Reviewed  POCT URINALYSIS DIPSTICK, ED / UC - Abnormal; Notable for the following components:      Result Value   Hgb urine dipstick MODERATE (*)    Protein, ur >=300 (*)    Leukocytes,Ua LARGE (*)    All other components within normal limits  SARS CORONAVIRUS 2 (TAT 6-24 HRS)    EKG   Radiology No results found.  Procedures Procedures (including critical care time)  Medications Ordered in UC Medications -  No data to display  Initial Impression / Assessment and Plan / UC Course  I have reviewed the triage vital signs and the nursing notes.  Pertinent labs & imaging results that were available during  my care of the patient were reviewed by me and considered in my medical decision making (see chart for details).     UA with moderate blood, leuk, protein. Culture sent, given pt is wheelchair bound and uses catheter/diaper, and has recurrent UTIs. Plan to treat for UTI with ciprofloxacin as below, which hs has been on before and tolerated well. Pt denies STI risk, deferred STI screen today.  Covid test sent today. Isolation precautions per CDC guidelines until negative result. Symptomatic relief with OTC Mucinex, Nyquil, etc. Return precautions- new/worsening fevers/chills, shortness of breath, chest pain, abd pain, etc.    Final Clinical Impressions(s) / UC Diagnoses   Final diagnoses:  Acute upper respiratory infection  Acute cystitis with hematuria  Wheelchair bound     Discharge Instructions     -Ciprofloxacin twice daily x7 days -We'll send a urine culture and call you if we need to change the antibiotic -We'll call you if the Covid test is positive.    ED Prescriptions    Medication Sig Dispense Auth. Provider   ciprofloxacin (CIPRO) 500 MG tablet Take 1 tablet (500 mg total) by mouth every 12 (twelve) hours. 10 tablet Rhys MartiniGraham, Talor Cheema E, PA-C   promethazine-dextromethorphan (PROMETHAZINE-DM) 6.25-15 MG/5ML syrup Take 5 mLs by mouth 4 (four) times daily as needed for cough. 118 mL Rhys MartiniGraham, Meriam Chojnowski E, PA-C   benzonatate (TESSALON) 100 MG capsule Take 1 capsule (100 mg total) by mouth every 8 (eight) hours. 21 capsule Rhys MartiniGraham, Coila Wardell E, PA-C     PDMP not reviewed this encounter.   Rhys MartiniGraham, Atharv Barriere E, PA-C 10/21/20 1858

## 2020-10-21 NOTE — ED Triage Notes (Signed)
Pt presents with cough, scratchy throat  and chills 4 days. Denies fever.  Pt report malodor in the urine and yellow penile discharge x 1 week.

## 2020-10-22 ENCOUNTER — Ambulatory Visit: Payer: Self-pay | Attending: Family Medicine

## 2020-10-22 LAB — SARS CORONAVIRUS 2 (TAT 6-24 HRS): SARS Coronavirus 2: POSITIVE — AB

## 2020-10-22 MED FILL — PROMETHAZINE-DM 6.25-15 MG/: 6.25-15 | 6 days supply | Qty: 118 | Fill #0

## 2020-10-22 MED FILL — CIPROFLOXACIN HCL 500 MG TA: 500 | 5 days supply | Qty: 10 | Fill #0

## 2020-10-22 MED FILL — BENZONATATE 100 MG CAPS: 100 | 7 days supply | Qty: 21 | Fill #0

## 2020-11-12 MED FILL — !LANTUS SOLOSTAR 100UNITS/M: 100 | 28 days supply | Qty: 3 | Fill #10

## 2020-11-12 MED FILL — GLIMEPIRIDE 4 MG TABS: 4 | 30 days supply | Qty: 30 | Fill #7

## 2020-11-12 MED FILL — METFORMIN HCL 1000 MG TABS: 1000 | 30 days supply | Qty: 60 | Fill #8
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
## 2020-11-18 ENCOUNTER — Other Ambulatory Visit: Payer: Self-pay | Admitting: Critical Care Medicine

## 2020-11-18 ENCOUNTER — Other Ambulatory Visit: Payer: Self-pay

## 2020-11-18 ENCOUNTER — Encounter: Payer: Self-pay | Admitting: Critical Care Medicine

## 2020-11-18 ENCOUNTER — Ambulatory Visit: Payer: Self-pay | Attending: Critical Care Medicine | Admitting: Critical Care Medicine

## 2020-11-18 VITALS — BP 160/88 | HR 73 | Resp 16

## 2020-11-18 DIAGNOSIS — Z1211 Encounter for screening for malignant neoplasm of colon: Secondary | ICD-10-CM

## 2020-11-18 DIAGNOSIS — E11649 Type 2 diabetes mellitus with hypoglycemia without coma: Secondary | ICD-10-CM

## 2020-11-18 DIAGNOSIS — E1165 Type 2 diabetes mellitus with hyperglycemia: Secondary | ICD-10-CM

## 2020-11-18 DIAGNOSIS — Z114 Encounter for screening for human immunodeficiency virus [HIV]: Secondary | ICD-10-CM

## 2020-11-18 DIAGNOSIS — N319 Neuromuscular dysfunction of bladder, unspecified: Secondary | ICD-10-CM

## 2020-11-18 DIAGNOSIS — E785 Hyperlipidemia, unspecified: Secondary | ICD-10-CM

## 2020-11-18 DIAGNOSIS — Z9049 Acquired absence of other specified parts of digestive tract: Secondary | ICD-10-CM

## 2020-11-18 DIAGNOSIS — I10 Essential (primary) hypertension: Secondary | ICD-10-CM

## 2020-11-18 DIAGNOSIS — E7800 Pure hypercholesterolemia, unspecified: Secondary | ICD-10-CM

## 2020-11-18 DIAGNOSIS — E1169 Type 2 diabetes mellitus with other specified complication: Secondary | ICD-10-CM

## 2020-11-18 DIAGNOSIS — G8220 Paraplegia, unspecified: Secondary | ICD-10-CM

## 2020-11-18 LAB — POCT GLYCOSYLATED HEMOGLOBIN (HGB A1C): HbA1c, POC (controlled diabetic range): 7.5 % — AB (ref 0.0–7.0)

## 2020-11-18 LAB — GLUCOSE, POCT (MANUAL RESULT ENTRY): POC Glucose: 103 mg/dl — AB (ref 70–99)

## 2020-11-18 MED ORDER — OMEPRAZOLE 20 MG PO CPDR: 20.0000 mg | DELAYED_RELEASE_CAPSULE | Freq: Every day | ORAL | 3 refills | Status: DC

## 2020-11-18 MED ORDER — ATORVASTATIN CALCIUM 20 MG PO TABS: 20.0000 mg | ORAL_TABLET | Freq: Every day | ORAL | 3 refills | Status: DC

## 2020-11-18 MED ORDER — TRUEPLUS PEN NEEDLES 32G X 4 MM MISC: 99 refills | Status: DC

## 2020-11-18 MED ORDER — VALSARTAN 320 MG PO TABS: 320.0000 mg | ORAL_TABLET | Freq: Every day | ORAL | 1 refills | Status: DC

## 2020-11-18 MED ORDER — BASAGLAR KWIKPEN 100 UNIT/ML ~~LOC~~ SOPN: 20.0000 [IU] | PEN_INJECTOR | Freq: Every day | SUBCUTANEOUS | 11 refills | Status: DC

## 2020-11-18 MED ORDER — ASPIRIN EC 81 MG PO TBEC: 81.0000 mg | DELAYED_RELEASE_TABLET | Freq: Every day | ORAL | 3 refills | Status: DC

## 2020-11-18 NOTE — Assessment & Plan Note (Signed)
At T11 level  Prior injury 2013  Needs bladder retraining and assessment  Referral to physical medicine for evaluation

## 2020-11-18 NOTE — Patient Instructions (Addendum)
  We will send a referral to rehab for bladder training We will change your Lisinopril to another medication to better help manage your BP, so please stop taking your Lisinopril. We have started you on Varsartin 320 mg tablet daily, please start taking this medication for your blood pressure Continue taking your Lipitor to manage your high cholesterol levels We screened you today for HIV Please pick up your fecal occult blood test kit for colonoscopy screening; Please return it to the clinic so we can screen you for colonoscopy. We will increase your insulin glargine to 20 units a day  Please stop taking the glimepiride  Please continue to increase you fluid intake Please return to see Lurena Joiner, our clinical pharmacist, in 2 weeks and Dr. Joya Gaskins in 4weeks.  Enviaremos una remisin a rehabilitacin para el entrenamiento de la vejiga. Cambiaremos su Lisinopril a otro medicamento para ayudarlo a Dentist su presin arterial, as que deje de tomar su Lisinopril. Le comenzamos a tomar Varsartin 320 mg comprimidos al da, comience a tomar [employer] para su presin arterial. Contine tomando su Lipitor para controlar sus niveles altos de colesterol Le hicimos una prueba de deteccin de VIH hoy. Recoja su kit de prueba de sangre oculta en heces para la prueba de colonoscopia; Devulvalo a la clnica para que podamos examinarlo para una colonoscopia. Aumentaremos su insulina glargina a 20 unidades al [employer] favor, deje de tomar la glimepirida. Contine aumentando su ingesta de lquidos. Vuelva a ver a Luke, nuestro [employer], en 2 semanas y al Dr. Joya Gaskins en 4 semanas.

## 2020-11-18 NOTE — Assessment & Plan Note (Signed)
Hyperlipidemia continue atorvastatin

## 2020-11-18 NOTE — Assessment & Plan Note (Signed)
Hypertension not at goal plan to discontinue Zestril and begin valsartan 320 mg daily

## 2020-11-18 NOTE — Assessment & Plan Note (Signed)
Uncontrolled type 2 diabetes with hyperglycemia A1c not at goal at 7.5  Plan is to discontinue Amaryl Increase insulin glargine to 20 units daily Follow-up with our clinical pharmacist in 2 weeks Dr. [name] again in 4 weeks Continue Januvia 100 mg daily Continue Metformin at 1000 mg twice daily

## 2020-11-18 NOTE — Assessment & Plan Note (Signed)
Neurogenic bladder referral to rehab made

## 2020-11-19 LAB — COMPREHENSIVE METABOLIC PANEL
ALT: 12 IU/L (ref 0–44)
AST: 12 IU/L (ref 0–40)
Albumin/Globulin Ratio: 1.2 (ref 1.2–2.2)
Albumin: 3.8 g/dL (ref 3.8–4.9)
Alkaline Phosphatase: 114 IU/L (ref 44–121)
BUN/Creatinine Ratio: 18 (ref 9–20)
BUN: 11 mg/dL (ref 6–24)
Bilirubin Total: <0.2 mg/dL (ref 0.0–1.2)
CO2: 21 mmol/L (ref 20–29)
Calcium: 9.3 mg/dL (ref 8.7–10.2)
Chloride: 102 mmol/L (ref 96–106)
Creatinine, Ser: 0.62 mg/dL — ABNORMAL LOW (ref 0.76–1.27)
GFR calc Af Amer: 127 mL/min/{1.73_m2} (ref >59)
GFR calc non Af Amer: 110 mL/min/{1.73_m2} (ref >59)
Globulin, Total: 3.3 g/dL (ref 1.5–4.5)
Glucose: 76 mg/dL (ref 65–99)
Potassium: 4.5 mmol/L (ref 3.5–5.2)
Sodium: 139 mmol/L (ref 134–144)
Total Protein: 7.1 g/dL (ref 6.0–8.5)

## 2020-11-19 LAB — HIV ANTIBODY (ROUTINE TESTING W REFLEX): HIV Screen 4th Generation wRfx: NONREACTIVE

## 2020-11-19 MED FILL — ?ATORVASTATIN 20 MG TABLET: 20 | 30 days supply | Qty: 30 | Fill #0

## 2020-11-19 MED FILL — TRUEPLUS PEN NDL 32GX5/32: 32G X 4 MM | 90 days supply | Qty: 100 | Fill #0

## 2020-11-19 MED FILL — ?VALSARTAN 320MG TAB: 320 | 30 days supply | Qty: 30 | Fill #0

## 2020-11-19 MED FILL — ?OMEPRAZOLE 20 MG CPDR: 20 | 30 days supply | Qty: 30 | Fill #0

## 2020-11-21 ENCOUNTER — Encounter: Payer: Self-pay | Admitting: *Deleted

## 2020-11-21 LAB — FECAL OCCULT BLOOD, IMMUNOCHEMICAL: Fecal Occult Bld: NEGATIVE

## 2020-11-22 MED FILL — ?BASAGLAR 100 UNITS/ML KWPE: 100 | 30 days supply | Qty: 6 | Fill #0

## 2020-11-25 ENCOUNTER — Encounter: Payer: Self-pay | Admitting: *Deleted

## 2020-11-28 ENCOUNTER — Encounter: Payer: Self-pay | Admitting: Physical Medicine and Rehabilitation

## 2020-12-16 MED FILL — ?METFORMIN HCL 1000 MG TAB: 1000 | 30 days supply | Qty: 60 | Fill #0

## 2020-12-16 MED FILL — ?VALSARTAN 320MG TAB: 320 | 30 days supply | Qty: 30 | Fill #1

## 2020-12-16 MED FILL — !JANUVIA 100MG TABLET: 100 | 21 days supply | Qty: 21 | Fill #2

## 2020-12-23 ENCOUNTER — Encounter
Payer: No Typology Code available for payment source | Attending: Physical Medicine and Rehabilitation | Admitting: Physical Medicine and Rehabilitation

## 2020-12-23 ENCOUNTER — Encounter: Payer: Self-pay | Admitting: Physical Medicine and Rehabilitation

## 2020-12-23 ENCOUNTER — Other Ambulatory Visit: Payer: Self-pay

## 2020-12-23 VITALS — BP 155/63 | HR 75 | Temp 98.0°F | Ht 67.0 in

## 2020-12-23 DIAGNOSIS — K592 Neurogenic bowel, not elsewhere classified: Secondary | ICD-10-CM

## 2020-12-23 DIAGNOSIS — N390 Urinary tract infection, site not specified: Secondary | ICD-10-CM

## 2020-12-23 DIAGNOSIS — N319 Neuromuscular dysfunction of bladder, unspecified: Secondary | ICD-10-CM

## 2020-12-23 DIAGNOSIS — G8220 Paraplegia, unspecified: Secondary | ICD-10-CM

## 2020-12-23 DIAGNOSIS — Z993 Dependence on wheelchair: Secondary | ICD-10-CM

## 2020-12-23 NOTE — Progress Notes (Signed)
Subjective:    Patient ID: John Meza, male    DOB: [birth year], 60 y.o.   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   Here for evaluation.   Larey Seat off a ladder- 2012-    W/C is from 2012- working well- tires bald- doesn't know the brand of the cushion but it's as old as the w/c- 2012.   Doesn't cath- had a foley in the hospital-  Got multiple infections- so was taken out.  Urologist- has one - close to [place]-   Doesn't feel when has BM- Did teach him about suppository-  Doesn't feel when pees- q15 minutes or so?    Per chart, had chronic bladder outlet obstruction  Has muscle spasms of Legs- they are not uncomfortable or painful- not on any medicine for it.     Pain Inventory Average Pain 0 Pain Right Now 0 My pain is No pain spinal cord injury  LOCATION OF PAIN  No pain spinal cord injury  BOWEL Number of stools per week: 2 per week Oral laxative use No  Type of laxative None Enema or suppository use No  History of colostomy No  Incontinent No   BLADDER Normal In and out cath, frequency ability to do so but does not Able to self cath No  Bladder incontinence Yes  Frequent urination No  Leakage with coughing No  Difficulty starting stream No  Incomplete bladder emptying I think so.   Mobility use a wheelchair transfers alone Do you have any goals in this area?  yes  Function disabled: date disabled 2012 I need assistance with the following:  meal prep, household duties and shopping Do you have any goals in this area?  yes  Neuro/Psych bowel control problems weakness numbness tingling trouble walking  Prior Studies Any changes since last visit?  no New Patient  Physicians involved in your care Any changes since last visit?  no New Patient   Family History  Problem Relation Age of Onset  . Diabetes Father   . Diabetes Sister    Social  History   Socioeconomic History  . Marital status: Married    Spouse name: Not on file  . Number of children: Not on file  . Years of education: Not on file  . Highest education level: Not on file  Occupational History  . Not on file  Tobacco Use  . Smoking status: Former Smoker    Packs/day: 1.00    Years: 5.00    Pack years: 5.00    Types: Cigarettes    Quit date: 10/13/2011    Years since quitting: 9.2  . Smokeless tobacco: Never Used  . Tobacco comment: 03-24-19 per pt he stopped 1 mo ago   Vaping Use  . Vaping Use: Never used  Substance and Sexual Activity  . Alcohol use: Yes    Alcohol/week: 1.0 standard drink    Types: 1 Cans of beer per week  . Drug use: No  . Sexual activity: Not Currently  Other Topics Concern  . Not on file  Social History Narrative  . Not on file   Social Determinants of Health   Financial Resource Strain: Not on file  Food Insecurity: Not on file  Transportation Needs: Not on file  Physical Activity: Not on file  Stress: Not on file  Social Connections: Not on file   Past Surgical History:  Procedure Laterality Date  . CHOLECYSTECTOMY N/A 05/01/2015   Procedure: LAPAROSCOPIC CHOLECYSTECTOMY WITH ATTEMPTED INTRAOPERATIVE CHOLANGIOGRAM;  Surgeon: Violeta Gelinas, MD;  Location: [facility] OR;  Service: General;  Laterality: N/A;  . SPINE SURGERY     Past Medical History:  Diagnosis Date  . Cellulitis and abscess of buttock 10/2016  . Diabetes mellitus   . Hypertension   . Paraplegia (HCC) 2013   fell from ladder  . SCI (spinal cord injury)   . Spine fracture 11/16/2011   T 11- T9-L1   There were no vitals taken for this visit.  Opioid Risk Score:   Fall Risk Score:  `1  Depression screen PHQ 2/9  Depression screen [facility] 2/9 11/18/2020 08/19/2020 12/28/2019 08/30/2019 03/24/2019 02/01/2019 12/26/2018  Decreased Interest 0 0 0 0 0 0 1  Down, Depressed, Hopeless 0 0 0 0 0 0 0  PHQ - 2 Score 0 0 0 0 0 0 1  Altered sleeping - - 0 - 0 0 0  Tired,  decreased energy - - 0 - 0 0 0  Change in appetite - - 0 - 0 0 0  Feeling bad or failure about yourself  - - 0 - 0 0 0  Trouble concentrating - - 0 - 0 0 0  Moving slowly or fidgety/restless - - 0 - 0 0 0  Suicidal thoughts - - 0 - 0 0 0  PHQ-9 Score - - 0 - 0 0 1  Difficult doing work/chores - - - - Not difficult at all Not difficult at all -   Review of Systems  Gastrointestinal: Positive for constipation.  Genitourinary:       No sensation to void  Musculoskeletal: Positive for gait problem.  All other systems reviewed and are negative.      Objective:   Physical Exam   MS: Ues 5/5 in deltoids, biceps, triceps, WE, grip and finger abd B/L LEs- 0/5 B/L in HF/KE/DF, PF and EHL B/L  Neuro: Flaccid LEs- no increased tone; no clonus B/L Had a slight amount of extensor tone, but not severe or even moderate.  Sensation normal down to T12 but absent at L1, so is a T12 paraplegic       Assessment & Plan:   Pt is a 60 yr old male with hx of DM- T12 paraplegia- ASIA A with neurogenic bowel and bladder and minimal spasticity. Also has uncontrolled DM. Has frequent UTIs-   1. Pt needs a new manual w/c. Esp a new cushion if at all possible. Will d/w pt at next visit.    2. Needs to have Urology determine- if needs to do in/out caths- 4-6x/day- I think he does, based on CT scan from 2018- also needs urodynamics study- and cystoscopy if need be. Can use red rubber catheters- suggest Urology to help get him a few red rubber catheters and also needs to be TAUGHT how to in/out caths.   3. Needs to do pressure relief- q15-20 minutes- while in w/c- EVERY 15 minutes- has to do press up for 30 seconds- 4x/hour. VERY important- reason he's had pressure ulcer sin the past.   4.  Missed urology appointment- has to call them- placed new referral to Urology. To get him to set up with Dr Sherron Monday.  [phone]   5.  1. Bowel program for SCI patients (BP)- use GLOVE   1. Use Dulcolax  suppositories 2. Start with doing bowel program within 1 hour after a meal- or dinner 3. Take bowels  meds -Po/oral at opposite time doing bowel program- if BP (bowel program) in evening, give PO meds in AM; and vice versa. might not need PO/oral medicines, so only start if NEEDS them.    4. Start with digital stimulation- up to 2nd knuckle- stimulate rectum x 30 seconds 5. Place suppository- wait 10 minutes 6. Do Dig stim/Digital stimulation) every 10-15 minutes until empty 7. If gets severely constipated, >3 days, can use Magnesium Citrate and follow in 4 hours with suppository or any type of enema.  8. Takes 6 weeks or so to get bowel to have a habit.  9.  Only add Senna bowel meds 1-2 tabs in AM if stays constipated.    6. F/U in 4-6 weeks- double appointment-   I spent a total of 45 minutes on visit- as detailed above.

## 2020-12-23 NOTE — Patient Instructions (Signed)
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 Also has uncontrolled DM. Has frequent UTIs-   1. Pt needs a new manual w/c. Esp a new cushion if at all possible. Will d/w pt at next visit.    2. Needs to have Urology determine- if needs to do in/out caths- 4-6x/day- I think he does, based on CT scan from 2018- also needs urodynamics study- and cystoscopy if need be. Can use red rubber catheters- suggest Urology to help get him a few red rubber catheters and also needs to be TAUGHT how to in/out caths.   3. Needs to do pressure relief- q15-20 minutes- while in w/c- EVERY 15 minutes- has to do press up for 30 seconds- 4x/hour. VERY important- reason he's had pressure ulcer sin the past.   4.  Missed urology appointment- has to call them- placed new referral to Urology. To get him to set up with Dr Sherron Monday.  [phone]   5.  1. Bowel program for SCI patients (BP)- use GLOVE   1. Use Dulcolax suppositories 2. Start with doing bowel program within 1 hour after a meal- or dinner 3. Take bowels meds -Po/oral at opposite time doing bowel program- if BP (bowel program) in evening, give PO meds in AM; and vice versa. might not need PO/oral medicines, so only start if NEEDS them.    4. Start with digital stimulation- up to 2nd knuckle- stimulate rectum x 30 seconds 5. Place suppository- wait 10 minutes 6. Do Dig stim/Digital stimulation) every 10-15 minutes until empty 7. If gets severely constipated, >3 days, can use Magnesium Citrate and follow in 4 hours with suppository or any type of enema.  8. Takes 6 weeks or so to get bowel to have a habit.  9.  Only add Senna bowel meds 1-2 tabs in AM if stays constipated.    6. F/U in 4-6 weeks- double appointment-

## 2020-12-26 MED FILL — ?BASAGLAR 100 UNITS/ML KWPE: 100 | 30 days supply | Qty: 6 | Fill #1
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
## 2020-12-30 ENCOUNTER — Encounter: Payer: Self-pay | Admitting: Critical Care Medicine

## 2020-12-30 ENCOUNTER — Ambulatory Visit: Payer: Self-pay | Attending: Critical Care Medicine | Admitting: Critical Care Medicine

## 2020-12-30 ENCOUNTER — Other Ambulatory Visit: Payer: Self-pay

## 2020-12-30 VITALS — BP 133/82 | HR 73

## 2020-12-30 DIAGNOSIS — L89310 Pressure ulcer of right buttock, unstageable: Secondary | ICD-10-CM

## 2020-12-30 DIAGNOSIS — N319 Neuromuscular dysfunction of bladder, unspecified: Secondary | ICD-10-CM

## 2020-12-30 DIAGNOSIS — E1165 Type 2 diabetes mellitus with hyperglycemia: Secondary | ICD-10-CM

## 2020-12-30 DIAGNOSIS — E7800 Pure hypercholesterolemia, unspecified: Secondary | ICD-10-CM

## 2020-12-30 DIAGNOSIS — K592 Neurogenic bowel, not elsewhere classified: Secondary | ICD-10-CM

## 2020-12-30 DIAGNOSIS — L89312 Pressure ulcer of right buttock, stage 2: Secondary | ICD-10-CM | POA: Insufficient documentation

## 2020-12-30 DIAGNOSIS — I10 Essential (primary) hypertension: Secondary | ICD-10-CM

## 2020-12-30 HISTORY — DX: Pressure ulcer of right buttock, unstageable: L89.310

## 2020-12-30 LAB — GLUCOSE, POCT (MANUAL RESULT ENTRY): POC Glucose: 125 mg/dl — AB (ref 70–99)

## 2020-12-30 NOTE — Assessment & Plan Note (Signed)
Blood pressure at goal on valsartan continue same

## 2020-12-30 NOTE — Assessment & Plan Note (Signed)
Type 2 diabetes with improved control no change in current diabetic regimen  I recommended the patient to stay on statin therapy and to resume aspirin 81 mg daily

## 2020-12-30 NOTE — Assessment & Plan Note (Signed)
Continue current dose of statin therapy

## 2020-12-30 NOTE — Assessment & Plan Note (Signed)
Patient given a bowel protocol per rehab I reiterated this and gave it to him in Spanish today on his AVS

## 2020-12-30 NOTE — Assessment & Plan Note (Signed)
Pressure ulcer right buttocks currently improving continue pressure relief per rehab recommendations

## 2020-12-30 NOTE — Assessment & Plan Note (Signed)
Again went over recommendations of rehab regarding neurogenic bladder patient is to follow-up with urology has existing appointment will keep

## 2020-12-30 NOTE — Patient Instructions (Signed)
Follow a Bowel program for SCI patients (BP)- use GLOVE  1. Use Dulcolax suppositories 2. Start with doing bowel program within 1 hour after a meal- or dinner 3. Take bowels meds -Po/oral at opposite time doing bowel program- if BP (bowel program) in evening, give PO meds in AM; and vice versa.might not need PO/oral medicines, so only start if NEEDS them.   4. Start with digital stimulation- up to 2nd knuckle- stimulate rectum x 30 seconds 5. Place suppository- wait 10 minutes 6. Do Dig stim/Digital stimulation) every 10-15 minutes until empty 7. If gets severely constipated, >3 days, can use Magnesium Citrate and follow in 4 hours with suppository or any type of enema.  8. Takes 6 weeks or so to get bowel to have a habit. 9.  Only add Senna bowel meds 1-2 tabs in AM if stays constipated.   No other medication changes except start aspirin 81mg  daily, you get this over the counter Return Dr 3 months  Will have case management look at your wheelchair for a new cushion  Siga un programa intestinal para pacientes con SCI (BP) - use GLOVE  1. [name] ovulos de Dulcolax 2. Comience con hacer el programa intestinal dentro de 1 hora despus de una comida o cena 3. Tome los medicamentos intestinales -Po/oral en el momento opuesto al del programa intestinal- si tiene BP (programa intestinal) por la noche, administre los medicamentos PO en la [place]; y viceversa. Es posible que no necesite PO/medicamentos orales, as que comience solo si los [place].  4. Comience con estimulacin digital, hasta el segundo nudillo, estimule el recto x 30 segundos 5. Coloque el supositorio, espere 10 minutos 6. Haga Dig stim/estimulacin digital) cada 10-15 minutos hasta que se vace 7. Si tiene estreimiento severo, >3 das, puede usar Citrato de Magnesio y seguir en 4 horas con supositorios o cualquier tipo de enema. 8. Toma alrededor de 6 semanas para que el intestino tenga un hbito. 9. Solo agregue 1 o 2  pestaas de Senna intestinal meds por la maana si contina estreido.  Ningn otro medicamento cambia, excepto comenzar con aspirina 81 mg al da, se obtiene sin [employer] Dr. Optometrist 3 meses  Har que la administracin de casos busque en su silla de ruedas un nuevo cojn.

## 2021-01-12 ENCOUNTER — Other Ambulatory Visit: Payer: Self-pay

## 2021-01-12 MED ORDER — JANUVIA 100 MG PO TABS
ORAL_TABLET | ORAL | 3 refills | Status: DC
  Filled 2021-01-14: qty 30, 30d supply, fill #0
  Filled 2021-02-13: qty 30, 30d supply, fill #1
  Filled 2021-03-14: qty 30, 30d supply, fill #2
  Filled 2021-04-11: qty 30, 30d supply, fill #3
  Filled 2021-05-12: qty 30, 30d supply, fill #4
  Filled 2021-06-10: qty 30, 30d supply, fill #5

## 2021-01-12 MED ORDER — OMEPRAZOLE 20 MG PO CPDR: 20.0000 mg | DELAYED_RELEASE_CAPSULE | Freq: Every day | ORAL | 11 refills | Status: DC

## 2021-01-14 ENCOUNTER — Other Ambulatory Visit: Payer: Self-pay

## 2021-01-14 MED FILL — Metformin HCl Tab 1000 MG: ORAL | 30 days supply | Qty: 60 | Fill #0 | Status: AC

## 2021-01-14 MED FILL — Valsartan Tab 320 MG: ORAL | 30 days supply | Qty: 30 | Fill #0 | Status: AC

## 2021-01-14 MED FILL — Atorvastatin Calcium Tab 20 MG (Base Equivalent): ORAL | 30 days supply | Qty: 30 | Fill #0 | Status: AC

## 2021-01-20 ENCOUNTER — Ambulatory Visit: Payer: Self-pay | Admitting: Critical Care Medicine

## 2021-01-28 ENCOUNTER — Other Ambulatory Visit: Payer: Self-pay

## 2021-01-28 MED FILL — Insulin Glargine Soln Pen-Injector 100 Unit/ML: SUBCUTANEOUS | 30 days supply | Qty: 6 | Fill #0 | Status: AC

## 2021-02-10 ENCOUNTER — Encounter: Payer: Self-pay | Admitting: Physical Medicine and Rehabilitation

## 2021-02-10 ENCOUNTER — Other Ambulatory Visit: Payer: Self-pay

## 2021-02-10 ENCOUNTER — Encounter
Payer: No Typology Code available for payment source | Attending: Physical Medicine and Rehabilitation | Admitting: Physical Medicine and Rehabilitation

## 2021-02-10 VITALS — BP 142/82 | HR 73 | Temp 98.4°F

## 2021-02-10 DIAGNOSIS — K592 Neurogenic bowel, not elsewhere classified: Secondary | ICD-10-CM | POA: Insufficient documentation

## 2021-02-10 DIAGNOSIS — G8220 Paraplegia, unspecified: Secondary | ICD-10-CM | POA: Insufficient documentation

## 2021-02-10 DIAGNOSIS — N319 Neuromuscular dysfunction of bladder, unspecified: Secondary | ICD-10-CM | POA: Insufficient documentation

## 2021-02-10 DIAGNOSIS — Z993 Dependence on wheelchair: Secondary | ICD-10-CM | POA: Insufficient documentation

## 2021-02-10 NOTE — Patient Instructions (Signed)
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 Also has uncontrolled DM. Has frequent UTIs-   Here for f/u on SCI/paraplegia.   1. Metformin could cause a little constipation  2. Constipation- Senna /Senokot- - great laxative- can get at drug store or $ store- 1 pill diaria mente and can do 2 pills if needed.   3. Bowel program- to try and prevent bowel accidents-   4. 1. Bowel program for SCI patients (BP)   1. Use Dulcolax suppositories 2. Start with doing bowel program within 1 hour after a meal- breakfast or dinner 3. Take bowels meds -Po/oral at opposite time doing bowel program- if BP (bowel program) in evening, give PO meds in AM; and vice versa. Wait on this   4. Start with digital stimulation- up to 2nd knuckle- stimulate rectum x 30 seconds 5. Place suppository- wait 10 minutes   6.    Do Dig stim/Digital stimulation) every 10-15 minutes  until empty  7. If gets severely constipated, >3 days, can use  Magnesium Citrate and follow in 4 hours with suppository or  any type of enema.   8. Takes 6 weeks or so to get bowel to have a habit.    5. Really needs new w/c, but at least needs new casters and new tires. And sounds like needs new foot plate on home w/c.  Sometimes can use bicycle tires for w/c- but not for tires.  York Spaniel has other manual w/c at home- with no foot plate-  Contacted [employer]- w/c company- [employer].   6. F/U in 4 months

## 2021-02-10 NOTE — Progress Notes (Signed)
Subjective:    Patient ID: John Meza, male    DOB: [birth year], 60 y.o.   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 Also has uncontrolled DM. Has frequent UTIs-   Here for f/u on SCI/paraplegia.   Saw urology- taught how to in/out cathing- 3x/day-diaper is dry in between.  Hasn't decided yet if going to do bowel program.  Was asking about pills in hospital and why cannot use those?   Is constipated- is dry- hard balls.      Pain Inventory Average Pain 0 Pain Right Now 0 My pain is no pain  LOCATION OF PAIN  Spinal cord injury  BOWEL Number of stools per week: 4 Oral laxative use No  Type of laxative na Enema or suppository use No  History of colostomy No  Incontinent No   BLADDER Normal In and out cath, frequency no, bladder normal Able to self cath na Bladder incontinence Yes  Frequent urination No  Leakage with coughing No  Difficulty starting stream No  Incomplete bladder emptying No    Mobility ability to climb steps?  no do you drive?  no use a wheelchair transfers alone  Function disabled: date disabled 2022 I need assistance with the following:  meal prep, household duties and shopping  Neuro/Psych bowel control problems weakness numbness tremor trouble walking  Prior Studies Any changes since last visit?  no  Physicians involved in your care Any changes since last visit?  no Primary care [name], MD   Family History  Problem Relation Age of Onset  . Diabetes Father   . Diabetes Sister    Social History   Socioeconomic History  . Marital status: Married    Spouse name: Not on file  . Number of children: Not on file  . Years of education: Not on file  . Highest education level: Not on file  Occupational History  . Not on file  Tobacco Use  . Smoking status: Former Smoker    Packs/day: 1.00    Years: 5.00     Pack years: 5.00    Types: Cigarettes    Quit date: 10/13/2011    Years since quitting: 9.3  . Smokeless tobacco: Never Used  . Tobacco comment: 03-24-19 per pt he stopped 1 mo ago   Vaping Use  . Vaping Use: Never used  Substance and Sexual Activity  . Alcohol use: Yes    Alcohol/week: 1.0 standard drink    Types: 1 Cans of beer per week  . Drug use: No  . Sexual activity: Not Currently  Other Topics Concern  . Not on file  Social History Narrative  . Not on file   Social Determinants of Health   Financial Resource Strain: Not on file  Food Insecurity: Not on file  Transportation Needs: Not on file  Physical Activity: Not on file  Stress: Not on file  Social Connections: Not on file   Past Surgical History:  Procedure Laterality Date  . CHOLECYSTECTOMY N/A 05/01/2015   Procedure: LAPAROSCOPIC CHOLECYSTECTOMY WITH ATTEMPTED INTRAOPERATIVE CHOLANGIOGRAM;  Surgeon: Violeta Gelinas, MD;  Location: [facility] OR;  Service: General;  Laterality: N/A;  . SPINE SURGERY     Past Medical History:  Diagnosis Date  . Cellulitis and abscess of buttock 10/2016  . Diabetes mellitus   . Hypertension   . Paraplegia (HCC) 2013   fell from  ladder  . SCI (spinal cord injury)   . Spine fracture 11/16/2011   T 11- T9-L1   There were no vitals taken for this visit.  Opioid Risk Score:   Fall Risk Score:  `1  Depression screen PHQ 2/9  Depression screen [facility] 2/9 12/30/2020 12/23/2020 11/18/2020 08/19/2020 12/28/2019 08/30/2019 03/24/2019  Decreased Interest 0 0 0 0 0 0 0  Down, Depressed, Hopeless 0 0 0 0 0 0 0  PHQ - 2 Score 0 0 0 0 0 0 0  Altered sleeping 0 0 - - 0 - 0  Tired, decreased energy 0 0 - - 0 - 0  Change in appetite 0 0 - - 0 - 0  Feeling bad or failure about yourself  0 0 - - 0 - 0  Trouble concentrating 0 0 - - 0 - 0  Moving slowly or fidgety/restless 0 0 - - 0 - 0  Suicidal thoughts - 0 - - 0 - 0  PHQ-9 Score 0 0 - - 0 - 0  Difficult doing work/chores - - - - - - Not difficult at  all     Review of Systems  Musculoskeletal: Positive for gait problem.  Neurological: Positive for tremors, weakness and numbness.  All other systems reviewed and are negative.      Objective:   Physical Exam Awake, alert, in manual w/c; interpretor in room, NAD Casters and Tires are bald! Needs new tires  W/C is flaking paint and in disrepair- pt has done good job caring for it, but it's 58 yrs old.   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 Also has uncontrolled DM. Has frequent UTIs-   Here for f/u on SCI/paraplegia.   1. Metformin could cause a little constipation  2. Constipation- Senna /Senokot- - great laxative- can get at drug store or $ store- 1 pill diaria mente and can do 2 pills if needed.   3. Bowel program- to try and prevent bowel accidents-   4. 1. Bowel program for SCI patients (BP)   1. Use Dulcolax suppositories 2. Start with doing bowel program within 1 hour after a meal- breakfast or dinner 3. Take bowels meds -Po/oral at opposite time doing bowel program- if BP (bowel program) in evening, give PO meds in AM; and vice versa. Wait on this   4. Start with digital stimulation- up to 2nd knuckle- stimulate rectum x 30 seconds 5. Place suppository- wait 10 minutes   6.    Do Dig stim/Digital stimulation) every 10-15 minutes  until empty  7. If gets severely constipated, >3 days, can use  Magnesium Citrate and follow in 4 hours with suppository or  any type of enema.   8. Takes 6 weeks or so to get bowel to have a habit.    5. Really needs new w/c, but at least needs new casters and new tires. And sounds like needs new foot plate on home w/c.  Sometimes can use bicycle tires for w/c- but not for tires.  York Spaniel has other manual w/c at home- with no foot plate-  Contacted [employer]- w/c company- [employer].   6. F/U in 4 months   I spent a total of 25 minutes on visit- discussing w/c needs and bowel program in depth.

## 2021-02-13 ENCOUNTER — Other Ambulatory Visit: Payer: Self-pay

## 2021-02-13 MED FILL — Atorvastatin Calcium Tab 20 MG (Base Equivalent): ORAL | 30 days supply | Qty: 30 | Fill #1 | Status: AC

## 2021-02-13 MED FILL — Valsartan Tab 320 MG: ORAL | 30 days supply | Qty: 30 | Fill #1 | Status: AC

## 2021-02-13 MED FILL — Metformin HCl Tab 1000 MG: ORAL | 30 days supply | Qty: 60 | Fill #1 | Status: AC

## 2021-02-14 ENCOUNTER — Telehealth: Payer: Self-pay | Admitting: *Deleted

## 2021-02-14 NOTE — Telephone Encounter (Signed)
John Meza from [facility] says Dr Berline Chough ordered DME for John Meza but they do not know what it is for.  Please advise.

## 2021-02-18 NOTE — Telephone Encounter (Signed)
  Per my note,  Really needs new w/c, but at least needs new casters and new tires. And sounds like needs new foot plate on home w/c.  Sometimes can use bicycle tires for w/c- but not for caster tires.  John Meza has other manual w/c at home- with no foot plate-  Contacted [employer]- w/c company- [employer].   So he needs someone to look at his home w/c and see if they can use foot plate from older w/c and put on newer w/c, OR get new foot plate for him- thanks, ML

## 2021-02-19 NOTE — Telephone Encounter (Signed)
Left message to [facility] with information from Dr Dahlia Client note.

## 2021-03-07 ENCOUNTER — Other Ambulatory Visit: Payer: Self-pay

## 2021-03-07 MED FILL — Insulin Glargine Soln Pen-Injector 100 Unit/ML: SUBCUTANEOUS | 30 days supply | Qty: 6 | Fill #1 | Status: AC

## 2021-03-13 ENCOUNTER — Other Ambulatory Visit: Payer: Self-pay

## 2021-03-14 ENCOUNTER — Other Ambulatory Visit: Payer: Self-pay

## 2021-03-14 MED FILL — Atorvastatin Calcium Tab 20 MG (Base Equivalent): ORAL | 30 days supply | Qty: 30 | Fill #2 | Status: AC

## 2021-03-14 MED FILL — Valsartan Tab 320 MG: ORAL | 25 days supply | Qty: 25 | Fill #2 | Status: AC

## 2021-03-14 MED FILL — Metformin HCl Tab 1000 MG: ORAL | 30 days supply | Qty: 60 | Fill #2 | Status: AC

## 2021-03-14 MED FILL — Valsartan Tab 320 MG: ORAL | 5 days supply | Qty: 5 | Fill #2 | Status: AC

## 2021-03-14 MED FILL — Valsartan Tab 320 MG: ORAL | 30 days supply | Qty: 30 | Fill #2 | Status: CN

## 2021-03-22 MED FILL — Metformin HCl Tab 1000 MG: ORAL | 30 days supply | Qty: 60 | Fill #3 | Status: CN

## 2021-03-24 ENCOUNTER — Other Ambulatory Visit: Payer: Self-pay
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
## 2021-03-31 ENCOUNTER — Other Ambulatory Visit: Payer: Self-pay

## 2021-03-31 ENCOUNTER — Encounter: Payer: Self-pay | Admitting: Critical Care Medicine

## 2021-03-31 ENCOUNTER — Ambulatory Visit
Payer: No Typology Code available for payment source | Attending: Critical Care Medicine | Admitting: Critical Care Medicine

## 2021-03-31 VITALS — BP 141/77 | HR 80 | Resp 16

## 2021-03-31 DIAGNOSIS — N319 Neuromuscular dysfunction of bladder, unspecified: Secondary | ICD-10-CM

## 2021-03-31 DIAGNOSIS — E1165 Type 2 diabetes mellitus with hyperglycemia: Secondary | ICD-10-CM

## 2021-03-31 DIAGNOSIS — I10 Essential (primary) hypertension: Secondary | ICD-10-CM

## 2021-03-31 DIAGNOSIS — Z23 Encounter for immunization: Secondary | ICD-10-CM

## 2021-03-31 DIAGNOSIS — E785 Hyperlipidemia, unspecified: Secondary | ICD-10-CM

## 2021-03-31 DIAGNOSIS — L89310 Pressure ulcer of right buttock, unstageable: Secondary | ICD-10-CM

## 2021-03-31 LAB — POCT GLYCOSYLATED HEMOGLOBIN (HGB A1C): Hemoglobin A1C: 8.4 % — AB (ref 4.0–5.6)

## 2021-03-31 LAB — GLUCOSE, POCT (MANUAL RESULT ENTRY): POC Glucose: 191 mg/dl — AB (ref 70–99)

## 2021-03-31 MED ORDER — METFORMIN HCL 1000 MG PO TABS
ORAL_TABLET | Freq: Two times a day (BID) | ORAL | 3 refills | Status: DC
  Filled 2021-03-31: qty 180, fill #0
  Filled 2021-04-11: qty 60, 30d supply, fill #0
  Filled 2021-05-12: qty 60, 30d supply, fill #1
  Filled 2021-06-10: qty 60, 30d supply, fill #2

## 2021-03-31 MED ORDER — VALSARTAN-HYDROCHLOROTHIAZIDE 320-25 MG PO TABS
1.0000 | ORAL_TABLET | Freq: Every day | ORAL | 3 refills | Status: DC
  Filled 2021-03-31: qty 30, 30d supply, fill #0
  Filled 2021-05-12: qty 30, 30d supply, fill #1
  Filled 2021-06-10: qty 30, 30d supply, fill #2

## 2021-03-31 MED ORDER — TRUEPLUS PEN NEEDLES 32G X 4 MM MISC
6 refills | Status: DC
  Filled 2021-03-31: qty 100, 25d supply, fill #0

## 2021-03-31 MED FILL — Insulin Glargine Soln Pen-Injector 100 Unit/ML: SUBCUTANEOUS | 30 days supply | Qty: 6 | Fill #2 | Status: AC

## 2021-03-31 NOTE — Assessment & Plan Note (Addendum)
BP not ideal , not at goal.  Goal is <140/90  Add diuretic to valsartan  Switch from Diovan 320 mg to Diovan-HCT 320-25 mg daily. Follow up at next visit.

## 2021-03-31 NOTE — Assessment & Plan Note (Signed)
Continue regular follow up with urology. Continue supplements per urology.  Follow up at next appointment.

## 2021-03-31 NOTE — Progress Notes (Signed)
Established Patient Office Visit  Subjective:  Patient ID: John Meza, male    DOB: 01-14-1961  Age: 60 y.o. MRN: [id number]  CC:  Chief Complaint  Patient presents with   Diabetes    HPI John Meza presents for a follow up visit regarding his diabetes and hypertension. The patient states he has been checking his blood sugars daily and has brought a log of his daily blood sugar levels to the visit today. The blood pressure log shows a range of values from 104 to 19 over the past 1.5 months. The patient has been taking metformin 1000 mg twice daily, sitagliptin 100 mg daily, and insulin glargine 20 units at bedtime regularly as prescribed. The patient has been having difficulty setting up an ophthalmologic appointment to have a dilated eye exam.  The patient has hypertension for which he takes Diovan 320 mg tablet daily as prescribed. His blood pressure is mildly elevated today at 141/77. The patient denies any headaches or dizziness.   The patient has a history of a pressure ulcer on his right buttock which has completely resolved.   The patient has a history of neurogenic bladder for which he has seen a urologist since his last visit at this clinic. He was told to take a cranberry supplement and a probiotic daily. The patient denies any recent dysuria or difficulty urinating.   Video Spanish interpreter Lilia number [id number] used during visit today.  Past Medical History:  Diagnosis Date   Cellulitis and abscess of buttock 10/2016   Diabetes mellitus    Hypertension    Paraplegia (HCC) 2013   fell from ladder   Pressure ulcer, buttock, right, unstageable (HCC) 12/30/2020   SCI (spinal cord injury)    Spine fracture 11/16/2011   T 11- T9-L1    Past Surgical History:  Procedure Laterality Date   CHOLECYSTECTOMY N/A 05/01/2015   Procedure: LAPAROSCOPIC CHOLECYSTECTOMY WITH ATTEMPTED INTRAOPERATIVE CHOLANGIOGRAM;  Surgeon: Violeta Gelinas, MD;  Location: [facility] OR;   Service: General;  Laterality: N/A;   SPINE SURGERY      Family History  Problem Relation Age of Onset   Diabetes Father    Diabetes Sister     Social History   Socioeconomic History   Marital status: Married    Spouse name: Not on file   Number of children: Not on file   Years of education: Not on file   Highest education level: Not on file  Occupational History   Not on file  Tobacco Use   Smoking status: Former    Packs/day: 1.00    Years: 5.00    Pack years: 5.00    Types: Cigarettes    Quit date: 10/13/2011    Years since quitting: 9.4   Smokeless tobacco: Never   Tobacco comments:    03-24-19 per pt he stopped 1 mo ago   Vaping Use   Vaping Use: Never used  Substance and Sexual Activity   Alcohol use: Yes    Alcohol/week: 1.0 standard drink    Types: 1 Cans of beer per week   Drug use: No   Sexual activity: Not Currently  Other Topics Concern   Not on file  Social History Narrative   Not on file   Social Determinants of Health   Financial Resource Strain: Not on file  Food Insecurity: Not on file  Transportation Needs: Not on file  Physical Activity: Not on file  Stress: Not on file  Social Connections: Not on file  Intimate  Partner Violence: Not on file    Outpatient Medications Prior to Visit  Medication Sig Dispense Refill   atorvastatin (LIPITOR) 20 MG tablet TAKE 1 TABLET (20 MG TOTAL) BY MOUTH DAILY. TO LOWER CHOLESTEROL 90 tablet 3   Insulin Glargine (BASAGLAR KWIKPEN) 100 UNIT/ML INJECT 20 UNITS INTO THE SKIN AT BEDTIME. 15 mL 11   omeprazole (PRILOSEC) 20 MG capsule Take 1 capsule by mouth daily to reduce stomach acid 30 capsule 11   sitaGLIPtin (JANUVIA) 100 MG tablet Take 1 tablet by mouth daily (Patient taking differently: Take 1 tablet by mouth daily) 90 tablet 3   Insulin Glargine (BASAGLAR KWIKPEN) 100 UNIT/ML Inject 20 Units into the skin at bedtime. 15 mL 11   insulin glargine (LANTUS) 100 UNIT/ML Solostar Pen INJECT 10 UNITS INTO THE  SKIN DAILY TO LOWER BLOOD SUGAR 15 mL 11   metFORMIN (GLUCOPHAGE) 1000 MG tablet Take 1 tablet (1,000 mg total) by mouth 2 (two) times daily with a meal. 180 tablet 3   metFORMIN (GLUCOPHAGE) 1000 MG tablet TAKE 1 TABLET (1,000 MG TOTAL) BY MOUTH 2 (TWO) TIMES DAILY WITH A MEAL. 180 tablet 3   sitaGLIPtin (JANUVIA) 100 MG tablet Take 1 tablet (100 mg total) by mouth daily. 90 tablet 3   valsartan (DIOVAN) 320 MG tablet Take 1 tablet (320 mg total) by mouth daily. 90 tablet 1   valsartan (DIOVAN) 320 MG tablet TAKE 1 TABLET (320 MG TOTAL) BY MOUTH DAILY. 90 tablet 1   aspirin 81 MG EC tablet TAKE 1 TABLET (81 MG TOTAL) BY MOUTH DAILY. TAKE AFTER EATING (Patient not taking: Reported on 03/31/2021) 90 tablet 3   aspirin EC 81 MG tablet Take 1 tablet (81 mg total) by mouth daily. Take after eating (Patient not taking: Reported on 03/31/2021) 90 tablet 3   atorvastatin (LIPITOR) 20 MG tablet Take 1 tablet (20 mg total) by mouth daily. To lower cholesterol 90 tablet 3   benzonatate (TESSALON) 100 MG capsule TAKE 1 CAPSULE (100 MG TOTAL) BY MOUTH EVERY 8 (EIGHT) HOURS. 21 capsule 0   ciprofloxacin (CIPRO) 500 MG tablet TAKE 1 TABLET (500 MG TOTAL) BY MOUTH EVERY 12 (TWELVE) HOURS. 10 tablet 0   Insulin Pen Needle (TRUEPLUS PEN NEEDLES) 32G X 4 MM MISC Use as directed to inject insulin 100 each prn   Insulin Pen Needle 32G X 4 MM MISC USE AS DIRECTED TO INJECT INSULIN 100 each 99   metFORMIN (GLUCOPHAGE) 1000 MG tablet TOME 1 PASTILLA POR VIA ORAL DOS VECES AL DIA CON LAS COMIDA 180 tablet 2   omeprazole (PRILOSEC) 20 MG capsule Take 1 capsule (20 mg total) by mouth daily. To reduce stomach acid 90 capsule 3   promethazine-dextromethorphan (PROMETHAZINE-DM) 6.25-15 MG/5ML syrup TAKE 5 MLS BY MOUTH 4 (FOUR) TIMES DAILY AS NEEDED FOR COUGH. (Patient not taking: No sig reported) 118 mL 0   No facility-administered medications prior to visit.    Allergies  Allergen Reactions   Macrobid [Nitrofurantoin]     Other Itching, Rash and Other (See Comments)    ANTIBIOTIC? CAUSED RASH, ITCHING IN 2013-PER FAMILY Possibly niacin causing flushing reaction- as described by family?    ROS Review of Systems  Constitutional:  Negative for chills and fatigue.  HENT:  Negative for congestion and rhinorrhea.   Eyes:  Negative for pain.  Respiratory:  Negative for cough and shortness of breath.   Cardiovascular:  Negative for chest pain.  Gastrointestinal:  Negative for abdominal pain, diarrhea, nausea and vomiting.  Genitourinary:  Negative for difficulty urinating and dysuria.  Musculoskeletal:  Negative for back pain.  Neurological:  Negative for headaches.  Psychiatric/Behavioral: Negative.       Objective:    Physical Exam Constitutional:      General: He is not in acute distress. HENT:     Head: Normocephalic and atraumatic.     Mouth/Throat:     Mouth: Mucous membranes are moist.     Comments: Plaque noted on teeth diffusely.  Eyes:     Conjunctiva/sclera: Conjunctivae normal.  Cardiovascular:     Rate and Rhythm: Normal rate and regular rhythm.     Heart sounds: Normal heart sounds. No murmur heard.   No friction rub. No gallop.  Pulmonary:     Effort: Pulmonary effort is normal. No respiratory distress.     Breath sounds: Normal breath sounds. No wheezing.  Abdominal:     General: There is no distension.     Palpations: Abdomen is soft.     Tenderness: no abdominal tenderness  Musculoskeletal:     Cervical back: Normal range of motion.     Comments: Paraplegia noted. Patient is wheelchair bound.   Neurological:     Mental Status: He is alert.     Comments: Paraplegia noted.   Psychiatric:        Mood and Affect: Mood normal.        Behavior: Behavior normal.    BP (!) 141/77 (BP Location: Right Arm, Patient Position: Sitting, Cuff Size: Normal)   Pulse 80   Resp 16   SpO2 100%  Wt Readings from Last 3 Encounters:  12/17/17 169 lb (76.7 kg)  11/03/16 169 lb 8 oz (76.9  kg)  12/30/15 160 lb (72.6 kg)   Lab Results  Component Value Date   HGBA1C 8.4 (A) 03/31/2021    CBG today 191    Health Maintenance Due  Topic Date Due   Pneumococcal Vaccine 15-99 Years old (1 - PCV) Never done   OPHTHALMOLOGY EXAM  Never done   Zoster Vaccines- Shingrix (1 of 2) Never done   COVID-19 Vaccine (4 - Booster for Pfizer series) 02/05/2021    There are no preventive care reminders to display for this patient.  Lab Results  Component Value Date   TSH 3.980 07/01/2018   Lab Results  Component Value Date   WBC 5.4 05/20/2020   HGB 13.0 05/20/2020   HCT 39.7 05/20/2020   MCV 90 05/20/2020   PLT 224 08/30/2019   Lab Results  Component Value Date   NA 139 11/18/2020   K 4.5 11/18/2020   CO2 21 11/18/2020   GLUCOSE 76 11/18/2020   BUN 11 11/18/2020   CREATININE 0.62 (L) 11/18/2020   BILITOT <0.2 11/18/2020   ALKPHOS 114 11/18/2020   AST 12 11/18/2020   ALT 12 11/18/2020   PROT 7.1 11/18/2020   ALBUMIN 3.8 11/18/2020   CALCIUM 9.3 11/18/2020   ANIONGAP 11 12/17/2017   Lab Results  Component Value Date   CHOL 110 12/28/2019   Lab Results  Component Value Date   HDL 40 12/28/2019   Lab Results  Component Value Date   LDLCALC 50 12/28/2019   Lab Results  Component Value Date   TRIG 110 12/28/2019   Lab Results  Component Value Date   CHOLHDL 2.8 12/28/2019   Lab Results  Component Value Date   HGBA1C 8.4 (A) 03/31/2021      Assessment & Plan:   Problem List Items Addressed  This Visit       Cardiovascular and Mediastinum   Essential hypertension    BP not ideal , not at goal.  Goal is <140/90  Add diuretic to valsartan  Switch from Diovan 320 mg to Diovan-HCT 320-25 mg daily. Follow up at next visit.        Relevant Medications   valsartan-hydrochlorothiazide (DIOVAN-HCT) 320-25 MG tablet     Endocrine   Diabetes type 2, uncontrolled (HCC) - Primary    A1C has increased however home sugars are improved.  Continue  metformin 1000 mg twice daily, sitagliptin 100 mg daily, and insulin glargine 20 units at bedtime. Continue to monitor blood sugars regularly at home and bring this log to each clinic visit.  Resend Eye consult to [facility] ophtalmology for retinal exam Follow up at next visit.        Relevant Medications   Insulin Pen Needle (TRUEPLUS PEN NEEDLES) 32G X 4 MM MISC   metFORMIN (GLUCOPHAGE) 1000 MG tablet   valsartan-hydrochlorothiazide (DIOVAN-HCT) 320-25 MG tablet   Other Relevant Orders   Glucose (CBG) (Completed)   HgB A1c (Completed)   Ambulatory referral to Ophthalmology     Musculoskeletal and Integument   RESOLVED: Pressure ulcer, buttock, right, unstageable (HCC)    Has resolved. Follow up at next visit.          Other   HLD (hyperlipidemia)    Continue Lipitor 20 mg daily. Follow up at next appointment        Relevant Medications   valsartan-hydrochlorothiazide (DIOVAN-HCT) 320-25 MG tablet   Neurogenic bladder    Continue regular follow up with urology. Continue supplements per urology.  Follow up at next appointment.       Other Visit Diagnoses     Need for vaccination against Streptococcus pneumoniae       Relevant Orders   Pneumococcal conjugate vaccine 13-valent (Completed)       Meds ordered this encounter  Medications   Insulin Pen Needle (TRUEPLUS PEN NEEDLES) 32G X 4 MM MISC    Sig: Use as directed to inject insulin    Dispense:  100 each    Refill:  6   metFORMIN (GLUCOPHAGE) 1000 MG tablet    Sig: TAKE 1 TABLET (1,000 MG TOTAL) BY MOUTH 2 (TWO) TIMES DAILY WITH A MEAL.    Dispense:  180 tablet    Refill:  3   valsartan-hydrochlorothiazide (DIOVAN-HCT) 320-25 MG tablet    Sig: Take 1 tablet by mouth daily.    Dispense:  90 tablet    Refill:  3    Follow-up: f/u in 3 months  PCV 20 pneumonia vax given  36 min spent using spanish interpreter for history, taken by PA Eilene Ghazi,  exam, education , treatment plan complex high    [name], MD  [name]

## 2021-03-31 NOTE — Assessment & Plan Note (Addendum)
A1C has increased however home sugars are improved.  Continue metformin 1000 mg twice daily, sitagliptin 100 mg daily, and insulin glargine 20 units at bedtime. Continue to monitor blood sugars regularly at home and bring this log to each clinic visit.  Resend Eye consult to [facility] ophtalmology for retinal exam Follow up at next visit.

## 2021-03-31 NOTE — Patient Instructions (Addendum)
We will refer you to another ophthalmologist Dr. Rosalyn Gess agrees for ophthalmology they will call you with an appointment to have your eyes examined with your diabetes  Change valsartan to valsartan HCT 1 daily new prescription sent to our pharmacy for blood pressure  They are preparing you your insulin Lantus and pen needles  No change in other medications  A pneumonia vaccine was given today  Please obtain a COVID booster vaccine below is a number you can call to locate a vaccine COVID-19 Vaccine Information can be found at: [email or web address] For questions related to vaccine distribution or appointments, please email [email or web address] or call [phone].    Excellent job on your diabetes return to see Dr. [name] again in 3 months   _______________________________________________________________________  Lo derivaremos a otro oftalmlogo. El Dr. Rosalyn Gess est de acuerdo para oftalmologa. Lo llamarn con una cita para que le examinen los ojos con su diabetes.  Cambiar valsartn por valsartn HCT 1 nueva receta diaria enviada a nuestra farmacia para la presin arterial  Te estn preparando tu insulina Lantus y agujas para pluma  Sin cambios en otros medicamentos  Hoy se administr una vacuna contra la neumona.  Obtenga una vacuna de refuerzo COVID a continuacin hay un nmero al que puede llamar para Aleda Grana una vacuna Puede encontrar informacin sobre la vacuna COVID-19 en: [email or web address] Para preguntas relacionadas con la distribucin de vacunas o citas, enve un correo electrnico [email or web address] o llame [phone].  Excelente trabajo en su regreso de la diabetes para volver a ver al Dr. [name] en 3 meses

## 2021-03-31 NOTE — Progress Notes (Signed)
Ophthalmology appt.  Diabetes f/u

## 2021-03-31 NOTE — Assessment & Plan Note (Signed)
Has resolved. Follow up at next visit.

## 2021-03-31 NOTE — Assessment & Plan Note (Signed)
Continue Lipitor 20 mg daily. Follow up at next appointment

## 2021-04-11 ENCOUNTER — Other Ambulatory Visit: Payer: Self-pay | Admitting: Critical Care Medicine

## 2021-04-11 ENCOUNTER — Other Ambulatory Visit: Payer: Self-pay

## 2021-04-11 MED FILL — Atorvastatin Calcium Tab 20 MG (Base Equivalent): ORAL | 30 days supply | Qty: 30 | Fill #3 | Status: AC

## 2021-04-28 ENCOUNTER — Other Ambulatory Visit: Payer: Self-pay

## 2021-05-02 ENCOUNTER — Other Ambulatory Visit: Payer: Self-pay

## 2021-05-02 MED FILL — Insulin Glargine Soln Pen-Injector 100 Unit/ML: SUBCUTANEOUS | 30 days supply | Qty: 6 | Fill #3 | Status: AC

## 2021-05-12 ENCOUNTER — Other Ambulatory Visit: Payer: Self-pay

## 2021-05-12 MED FILL — Atorvastatin Calcium Tab 20 MG (Base Equivalent): ORAL | 30 days supply | Qty: 30 | Fill #4 | Status: AC

## 2021-05-14 ENCOUNTER — Encounter (HOSPITAL_COMMUNITY): Payer: Self-pay | Admitting: Emergency Medicine

## 2021-05-14 ENCOUNTER — Emergency Department (HOSPITAL_COMMUNITY): Payer: No Typology Code available for payment source

## 2021-05-14 ENCOUNTER — Ambulatory Visit: Payer: Self-pay | Admitting: *Deleted

## 2021-05-14 ENCOUNTER — Emergency Department (HOSPITAL_COMMUNITY)
Admission: EM | Admit: 2021-05-14 | Discharge: 2021-05-14 | Disposition: A | Discharge status: Home / Self Care | Payer: No Typology Code available for payment source | Attending: Emergency Medicine | Admitting: Emergency Medicine

## 2021-05-14 DIAGNOSIS — Z79899 Other long term (current) drug therapy: Secondary | ICD-10-CM | POA: Insufficient documentation

## 2021-05-14 DIAGNOSIS — Z794 Long term (current) use of insulin: Secondary | ICD-10-CM | POA: Insufficient documentation

## 2021-05-14 DIAGNOSIS — L89154 Pressure ulcer of sacral region, stage 4: Secondary | ICD-10-CM | POA: Insufficient documentation

## 2021-05-14 DIAGNOSIS — Z7984 Long term (current) use of oral hypoglycemic drugs: Secondary | ICD-10-CM | POA: Insufficient documentation

## 2021-05-14 DIAGNOSIS — L89314 Pressure ulcer of right buttock, stage 4: Secondary | ICD-10-CM | POA: Insufficient documentation

## 2021-05-14 DIAGNOSIS — G8220 Paraplegia, unspecified: Secondary | ICD-10-CM | POA: Insufficient documentation

## 2021-05-14 DIAGNOSIS — E119 Type 2 diabetes mellitus without complications: Secondary | ICD-10-CM | POA: Insufficient documentation

## 2021-05-14 DIAGNOSIS — N400 Enlarged prostate without lower urinary tract symptoms: Secondary | ICD-10-CM | POA: Insufficient documentation

## 2021-05-14 DIAGNOSIS — Z87891 Personal history of nicotine dependence: Secondary | ICD-10-CM | POA: Insufficient documentation

## 2021-05-14 DIAGNOSIS — I10 Essential (primary) hypertension: Secondary | ICD-10-CM | POA: Insufficient documentation

## 2021-05-14 LAB — CBC WITH DIFFERENTIAL/PLATELET
Abs Immature Granulocytes: 0.03 10*3/uL (ref 0.00–0.07)
Basophils Absolute: 0 10*3/uL (ref 0.0–0.1)
Basophils Relative: 1 %
Eosinophils Absolute: 0.1 10*3/uL (ref 0.0–0.5)
Eosinophils Relative: 1 %
HCT: 33 % — ABNORMAL LOW (ref 39.0–52.0)
Hemoglobin: 11.2 g/dL — ABNORMAL LOW (ref 13.0–17.0)
Immature Granulocytes: 0 %
Lymphocytes Relative: 23 %
Lymphs Abs: 2 10*3/uL (ref 0.7–4.0)
MCH: 29.9 pg (ref 26.0–34.0)
MCHC: 33.9 g/dL (ref 30.0–36.0)
MCV: 88.2 fL (ref 80.0–100.0)
Monocytes Absolute: 0.7 10*3/uL (ref 0.1–1.0)
Monocytes Relative: 8 %
Neutro Abs: 5.9 10*3/uL (ref 1.7–7.7)
Neutrophils Relative %: 67 %
Platelets: 245 10*3/uL (ref 150–400)
RBC: 3.74 MIL/uL — ABNORMAL LOW (ref 4.22–5.81)
RDW: 13.1 % (ref 11.5–15.5)
WBC: 8.7 10*3/uL (ref 4.0–10.5)
nRBC: 0 % (ref 0.0–0.2)

## 2021-05-14 LAB — COMPREHENSIVE METABOLIC PANEL
ALT: 15 U/L (ref 0–44)
AST: 15 U/L (ref 15–41)
Albumin: 2.8 g/dL — ABNORMAL LOW (ref 3.5–5.0)
Alkaline Phosphatase: 92 U/L (ref 38–126)
Anion gap: 10 (ref 5–15)
BUN: 18 mg/dL (ref 6–20)
CO2: 21 mmol/L — ABNORMAL LOW (ref 22–32)
Calcium: 9.1 mg/dL (ref 8.9–10.3)
Chloride: 103 mmol/L (ref 98–111)
Creatinine, Ser: 0.94 mg/dL (ref 0.61–1.24)
GFR, Estimated: >60 mL/min (ref >60)
Glucose, Bld: 158 mg/dL — ABNORMAL HIGH (ref 70–99)
Potassium: 4.6 mmol/L (ref 3.5–5.1)
Sodium: 134 mmol/L — ABNORMAL LOW (ref 135–145)
Total Bilirubin: 0.5 mg/dL (ref 0.3–1.2)
Total Protein: 6.7 g/dL (ref 6.5–8.1)

## 2021-05-14 LAB — LACTIC ACID, PLASMA
Lactic Acid, Venous: 2.6 mmol/L (ref 0.5–1.9)
Lactic Acid, Venous: 1.5 mmol/L (ref 0.5–1.9)

## 2021-05-14 MED ORDER — PIPERACILLIN-TAZOBACTAM 3.375 G IVPB 30 MIN
3.3750 g | Freq: Once | INTRAVENOUS | Status: AC
  Administered 2021-05-14: 3.375 g via INTRAVENOUS
  Filled 2021-05-14: qty 50

## 2021-05-14 MED ORDER — VANCOMYCIN HCL 1500 MG/300ML IV SOLN
1500.0000 mg | Freq: Once | INTRAVENOUS | Status: AC
  Administered 2021-05-14: 1500 mg via INTRAVENOUS
  Filled 2021-05-14: qty 300

## 2021-05-14 MED ORDER — IOHEXOL 300 MG/ML  SOLN
100.0000 mL | Freq: Once | INTRAMUSCULAR | Status: AC | PRN
  Administered 2021-05-14: 100 mL via INTRAVENOUS

## 2021-05-14 MED ORDER — SULFAMETHOXAZOLE-TRIMETHOPRIM 800-160 MG PO TABS: 1.0000 | ORAL_TABLET | Freq: Two times a day (BID) | ORAL | 0 refills | Status: AC

## 2021-05-14 MED ORDER — SODIUM CHLORIDE 0.9 % IV BOLUS
1000.0000 mL | Freq: Once | INTRAVENOUS | Status: AC
  Administered 2021-05-14: 1000 mL via INTRAVENOUS

## 2021-05-14 MED ORDER — CEPHALEXIN 500 MG PO CAPS: 500.0000 mg | ORAL_CAPSULE | Freq: Three times a day (TID) | ORAL | 0 refills | Status: DC

## 2021-05-14 NOTE — ED Provider Notes (Signed)
Emergency Medicine Provider Triage Evaluation Note  John Meza , a 60 y.o. male  was evaluated in triage.  Pt complains of wound infection.  Patient states he has had a wound of his left upper leg/buttock for the past 4 weeks.  Over the past week, started draining yellow fluid.  He denies significant pain to the area.  No fevers, chills, nausea, vomiting, confusion, weakness.  History of diabetes, blood sugars have been good, 120-130.  Review of Systems  Positive: wound Negative: fever  Physical Exam  BP (!) 141/69 (BP Location: Right Arm)   Pulse 82   Temp 98.4 F (36.9 C) (Oral)   Resp 17   SpO2 100%  Gen:   Awake, no distress   Resp:  Normal effort  MSK:   Moves extremities without difficulty  Other:  Unable to fully visualize wound as patient is in a wheelchair  Medical Decision Making  Medically screening exam initiated at 2:22 PM.  Appropriate orders placed.  Wilton Thrall was informed that the remainder of the evaluation will be completed by another provider, this initial triage assessment does not replace that evaluation, and the importance of remaining in the ED until their evaluation is complete.  Labs and xray ordered   Alveria Apley, PA-C 05/14/21 1423    Gloris Manchester, MD 05/16/21 [phone]

## 2021-05-14 NOTE — Telephone Encounter (Signed)
Wife calls-patient has one sore, size of a lime on the outer aspect of the right buttock for 4 weeks. For about 2 weeks it has been draining a yellow liquid. Reports the skin surrounding the sore is brown-purplish in color. Has been cleaning it with peroxide and alcohol for 4 weeks. Care advice including do not use peroxide/alcohol any longer. Clean with water and antibacterial soap and air dry.Advised ED for evaluation at this time. No fever no other symptoms.

## 2021-05-14 NOTE — ED Notes (Signed)
Discussed discharge instructions with pt and wife at bedside including prescription, follow up care with wound clinic, and reasons to return to the ED. Pt and wife/caregiver was able to verbalized understanding with no questions at this time. Pt to go home with wife and daughter.

## 2021-05-14 NOTE — ED Provider Notes (Signed)
[facility] EMERGENCY DEPARTMENT Provider Note   CSN: [id number] Arrival date & time: 05/14/21  1403     History Chief Complaint  Patient presents with   Wound Check    John Meza is a 60 y.o. male history of spinal cord injury now paraplegia, diabetes, hypertension, chronic right buttock ulcer here presenting with draining from the ulcer for a week.  Patient is bedbound at baseline.  The wife does change his wound for his chronic right sacral ulcer.  She states that there is more purulent drainage from the ulcer for the last week or so.  No fevers. Patient's blood sugars has been around 120s.  Patient has no fever.  Patient states that he has no sensation from the waist down and this is baseline so he has no pain.  The history is provided by the patient.      Past Medical History:  Diagnosis Date   Cellulitis and abscess of buttock 10/2016   Diabetes mellitus    Hypertension    Paraplegia (HCC) 2013   fell from ladder   Pressure ulcer, buttock, right, unstageable (HCC) 12/30/2020   SCI (spinal cord injury)    Spine fracture 11/16/2011   T 11- T9-L1    Patient Active Problem List   Diagnosis Date Noted   Wheelchair dependence 12/23/2020   History of cholecystectomy 11/18/2020   Diabetes type 2, uncontrolled (HCC)    Essential hypertension    Neurogenic bladder 01/06/2012   Neurogenic bowel 01/06/2012   Paraplegia (HCC) 11/16/2011   OBESITY, MODERATE 05/26/2007   HLD (hyperlipidemia) 05/25/2007    Past Surgical History:  Procedure Laterality Date   CHOLECYSTECTOMY N/A 05/01/2015   Procedure: LAPAROSCOPIC CHOLECYSTECTOMY WITH ATTEMPTED INTRAOPERATIVE CHOLANGIOGRAM;  Surgeon: Violeta Gelinas, MD;  Location: [facility] OR;  Service: General;  Laterality: N/A;   SPINE SURGERY         Family History  Problem Relation Age of Onset   Diabetes Father    Diabetes Sister     Social History   Tobacco Use   Smoking status: Former    Packs/day: 1.00     Years: 5.00    Pack years: 5.00    Types: Cigarettes    Quit date: 10/13/2011    Years since quitting: 9.5   Smokeless tobacco: Never   Tobacco comments:    03-24-19 per pt he stopped 1 mo ago   Vaping Use   Vaping Use: Never used  Substance Use Topics   Alcohol use: Yes    Alcohol/week: 1.0 standard drink    Types: 1 Cans of beer per week   Drug use: No    Home Medications Prior to Admission medications   Medication Sig Start Date End Date Taking? Authorizing Provider  aspirin 81 MG EC tablet TAKE 1 TABLET (81 MG TOTAL) BY MOUTH DAILY. TAKE AFTER EATING Patient not taking: Reported on 03/31/2021 11/18/20 11/18/21  Storm Frisk, MD  atorvastatin (LIPITOR) 20 MG tablet TAKE 1 TABLET (20 MG TOTAL) BY MOUTH DAILY. TO LOWER CHOLESTEROL 11/18/20 11/18/21  Storm Frisk, MD  Insulin Glargine [facility]) 100 UNIT/ML INJECT 20 UNITS INTO THE SKIN AT BEDTIME. 11/18/20 11/18/21  Storm Frisk, MD  Insulin Pen Needle (TRUEPLUS PEN NEEDLES) 32G X 4 MM MISC Use as directed to inject insulin 03/31/21   Storm Frisk, MD  metFORMIN (GLUCOPHAGE) 1000 MG tablet TAKE 1 TABLET (1,000 MG TOTAL) BY MOUTH 2 (TWO) TIMES DAILY WITH A MEAL. 03/31/21 03/31/22  [name],  Charlcie Cradle, MD  omeprazole (PRILOSEC) 20 MG capsule Take 1 capsule by mouth daily to reduce stomach acid 11/18/20 11/18/21  Storm Frisk, MD  sitaGLIPtin (JANUVIA) 100 MG tablet Take 1 tablet by mouth daily Patient taking differently: Take 1 tablet by mouth daily 08/19/20   Hoy Register, MD  valsartan-hydrochlorothiazide (DIOVAN-HCT) 320-25 MG tablet Take 1 tablet by mouth daily. 03/31/21   Storm Frisk, MD  loratadine (CLARITIN) 10 MG tablet Take 1 tablet (10 mg total) by mouth daily. As needed for itchy throat/allergy symptoms Patient not taking: Reported on 03/24/2019 12/26/18 07/05/20  Cain Saupe, MD    Allergies    Macrobid [nitrofurantoin] and Other  Review of Systems   Review of Systems  Skin:  Positive for wound.  All  other systems reviewed and are negative.  Physical Exam Updated Vital Signs BP 139/72   Pulse 76   Temp 98.4 F (36.9 C) (Oral)   Resp 17   Wt 76.7 kg   SpO2 100%   BMI 26.48 kg/m   Physical Exam Vitals and nursing note reviewed.  Constitutional:      Comments: Chronically ill  HENT:     Head: Normocephalic.     Nose: Nose normal.     Mouth/Throat:     Mouth: Mucous membranes are dry.  Eyes:     Extraocular Movements: Extraocular movements intact.     Pupils: Pupils are equal, round, and reactive to light.  Cardiovascular:     Rate and Rhythm: Normal rate and regular rhythm.     Pulses: Normal pulses.     Heart sounds: Normal heart sounds.  Pulmonary:     Effort: Pulmonary effort is normal.     Breath sounds: Normal breath sounds.  Abdominal:     General: Abdomen is flat.     Palpations: Abdomen is soft.  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  No obvious purulent discharge.  There appears to be bone exposed.  No extension to the rectum or the scrotum. Musculoskeletal:     Cervical back: Normal range of motion and neck supple.     Comments: Paraplegia  Skin:    General: Skin is warm.     Capillary Refill: Capillary refill takes less than 2 seconds.     Findings: Erythema present.  Neurological:     Comments: Paraplegic and no sensation from the waist down   Psychiatric:        Mood and Affect: Mood normal.        Behavior: Behavior normal.    ED Results / Procedures / Treatments   Labs (all labs ordered are listed, but only abnormal results are displayed) Labs Reviewed  CBC WITH DIFFERENTIAL/PLATELET - Abnormal; Notable for the following components:      Result Value   RBC 3.74 (*)    Hemoglobin 11.2 (*)    HCT 33.0 (*)    All other components within normal limits  COMPREHENSIVE METABOLIC PANEL - Abnormal; Notable for the following components:   Sodium 134 (*)    CO2 21 (*)    Glucose, Bld 158 (*)    Albumin 2.8  (*)    All other components within normal limits  LACTIC ACID, PLASMA - Abnormal; Notable for the following components:   Lactic Acid, Venous 2.6 (*)    All other components within normal limits  LACTIC ACID, PLASMA  CBG MONITORING, ED    EKG None  Radiology DG  Pelvis 1-2 Views  Result Date: 05/14/2021 CLINICAL DATA:  Rule out osteo EXAM: PELVIS - 1 VIEW COMPARISON:  X-ray pelvis dated November 16, 2011 FINDINGS: No acute fracture or dislocation of the bilateral hips. Visualized sacrum is intact. Mild degenerative changes of the bilateral hip joints and sacroiliac joints. No evidence of osseous destruction. Edema and probable soft tissue defect of the soft tissues adjacent to the right inferior pubic ramus. IMPRESSION: No evidence of osteomyelitis on single AP view of the pelvis. Electronically Signed   By: Allegra LaiLeah  Strickland MD   On: 05/14/2021 15:17   CT PELVIS W CONTRAST  Result Date: 05/14/2021 CLINICAL DATA:  Pressure ulcer in the right buttock area. EXAM: CT PELVIS WITH CONTRAST TECHNIQUE: Multidetector CT imaging of the pelvis was performed using the standard protocol following the bolus administration of intravenous contrast. CONTRAST:  100mL OMNIPAQUE IOHEXOL 300 MG/ML  SOLN COMPARISON:  CT pelvis 11/03/2016 FINDINGS: Urinary Tract: The bladder is mildly distended and there are cellules and mild trabeculation and bladder diverticuli suggesting partial bladder outlet obstruction. No discrete bladder mass or bladder calculi. Bowel: The rectum, sigmoid colon and visualized small-bowel loops are grossly normal. Moderate stool in the sigmoid colon and rectum. No perirectal abscess or findings to suggest a perianal fistula. Vascular/Lymphatic: The major vascular structures are unremarkable. No aneurysm or dissection. The major venous structures are patent. No pelvic or lower abdominal lymphadenopathy. No inguinal adenopathy. Reproductive: Mild prostate gland enlargement. The seminal vesicles are  grossly normal. Other: There is a sizable decubitus ulcer in the right gluteal/right ischial region. It does not extend down to the bone. It is very thick walled and contains fluid and air. There is surrounding inflammatory change. This is the same location as the prior CT scan from 2018. No sacral decubitus ulcer is identified. No left-sided ischial pressure lesion or decubitus. Musculoskeletal: Diffuse fatty atrophy of the hip and pelvic musculature due to paraplegia. IMPRESSION: 1. Decubitus ulcer in the right gluteal/right ischial region without findings for osteomyelitis. 2. No perirectal abscess or findings to suggest a perianal fistula. 3. Distended bladder with cellules and mild trabeculation and bladder diverticuli suggesting partial bladder outlet obstruction. 4. Diffuse fatty atrophy of the hip and pelvic musculature due to paraplegia. Electronically Signed   By: Rudie MeyerP.  Gallerani M.D.   On: 05/14/2021 21:23    Procedures Procedures   Medications Ordered in ED Medications  sodium chloride 0.9 % bolus 1,000 mL (0 mLs Intravenous Stopped 05/14/21 2145)  piperacillin-tazobactam (ZOSYN) IVPB 3.375 g (0 g Intravenous Stopped 05/14/21 2100)  vancomycin (VANCOREADY) IVPB 1500 mg/300 mL (0 mg Intravenous Stopped 05/14/21 2144)  iohexol (OMNIPAQUE) 300 MG/ML solution 100 mL (100 mLs Intravenous Contrast Given 05/14/21 2106)    ED Course  I have reviewed the triage vital signs and the nursing notes.  Pertinent labs & imaging results that were available during my care of the patient were reviewed by me and considered in my medical decision making (see chart for details).    MDM Rules/Calculators/A&P                          Carlus PavlovJose Garcia-Morales is a 60 y.o. male here presenting with draining from his sacral decub ulcer.  We will get CT to rule out osteomyelitis.  We will get some basic lab work and give antibiotics as well.  10:36 PM Patient's WBC is normal, Lactate is 2.6 improved with IVF. CT showed no  osteo. Given vanc/zosyn. I  think patient is stable for dc home with abx.    Final Clinical Impression(s) / ED Diagnoses Final diagnoses:  None    Rx / DC Orders ED Discharge Orders     None        Charlynne Pander, MD 05/14/21 2242

## 2021-05-14 NOTE — ED Triage Notes (Signed)
Patient complains of open sore on right buttock that first appeared four weeks ago. Patient reports yellow watery discharge from the wound. Denies fevers, denies nausea. Patient alert, oriented, and in no apparent distress at this time.

## 2021-05-14 NOTE — ED Notes (Signed)
Patient transported to CT 

## 2021-05-14 NOTE — Discharge Instructions (Addendum)
You have a stage IV decub ulcer.   Please take Keflex and Bactrim as prescribed  You need to see your doctor for follow-up   Please follow-up with the wound care center  Return to ER if you have worse sacral decub ulcer, fever, purulent drainage

## 2021-05-14 NOTE — Telephone Encounter (Signed)
Reason for Disposition  Large sore (> 1 inch or 2.5 cm across)  Answer Assessment - Initial Assessment Questions 1. APPEARANCE of SORES: "What do the sores look like?"     Dark brown draining dark fluid 2. NUMBER: "How many sores are there?"     one 3. SIZE: "How big is the largest sore?"      Size of lime 4. LOCATION: "Where are the sores located?"     Right buttock 5. ONSET: "When did the sores begin?"     4 weeks 6. CAUSE: "What do you think is causing the sores?"     unknown 7. OTHER SYMPTOMS: "Do you have any other symptoms?" (e.g., fever, new weakness)     Purple-brownish skin around the sore.  Protocols used: [employer]

## 2021-05-15 ENCOUNTER — Other Ambulatory Visit: Payer: Self-pay

## 2021-05-15 MED ORDER — SULFAMETHOXAZOLE-TRIMETHOPRIM 800-160 MG PO TABS
ORAL_TABLET | ORAL | 0 refills | Status: DC
  Filled 2021-05-15: qty 14, 7d supply, fill #0

## 2021-05-15 MED ORDER — CEPHALEXIN 500 MG PO CAPS
ORAL_CAPSULE | ORAL | 0 refills | Status: DC
Start: 2021-05-14 — End: 2021-07-01
  Filled 2021-05-15: qty 21, 7d supply, fill #0

## 2021-05-23 ENCOUNTER — Ambulatory Visit: Payer: Self-pay | Attending: Critical Care Medicine

## 2021-05-23 ENCOUNTER — Other Ambulatory Visit: Payer: Self-pay

## 2021-05-23 ENCOUNTER — Encounter (INDEPENDENT_AMBULATORY_CARE_PROVIDER_SITE_OTHER): Payer: Self-pay

## 2021-06-10 ENCOUNTER — Other Ambulatory Visit: Payer: Self-pay

## 2021-06-10 MED FILL — Atorvastatin Calcium Tab 20 MG (Base Equivalent): ORAL | 30 days supply | Qty: 30 | Fill #5 | Status: AC

## 2021-06-10 MED FILL — Insulin Glargine Soln Pen-Injector 100 Unit/ML: SUBCUTANEOUS | 30 days supply | Qty: 6 | Fill #4 | Status: AC

## 2021-06-11 ENCOUNTER — Other Ambulatory Visit: Payer: Self-pay

## 2021-06-12 ENCOUNTER — Other Ambulatory Visit: Payer: Self-pay

## 2021-06-13 ENCOUNTER — Encounter: Payer: No Typology Code available for payment source | Admitting: Physical Medicine and Rehabilitation

## 2021-06-19 ENCOUNTER — Encounter (HOSPITAL_BASED_OUTPATIENT_CLINIC_OR_DEPARTMENT_OTHER): Payer: No Typology Code available for payment source | Admitting: Internal Medicine

## 2021-07-01 ENCOUNTER — Other Ambulatory Visit: Payer: Self-pay

## 2021-07-01 ENCOUNTER — Ambulatory Visit: Payer: Self-pay | Attending: Critical Care Medicine | Admitting: Critical Care Medicine

## 2021-07-01 ENCOUNTER — Encounter: Payer: Self-pay | Admitting: Critical Care Medicine

## 2021-07-01 VITALS — BP 128/81 | HR 74 | Resp 16

## 2021-07-01 DIAGNOSIS — Z23 Encounter for immunization: Secondary | ICD-10-CM

## 2021-07-01 DIAGNOSIS — L89312 Pressure ulcer of right buttock, stage 2: Secondary | ICD-10-CM

## 2021-07-01 DIAGNOSIS — E1165 Type 2 diabetes mellitus with hyperglycemia: Secondary | ICD-10-CM

## 2021-07-01 DIAGNOSIS — E119 Type 2 diabetes mellitus without complications: Secondary | ICD-10-CM

## 2021-07-01 DIAGNOSIS — K592 Neurogenic bowel, not elsewhere classified: Secondary | ICD-10-CM

## 2021-07-01 DIAGNOSIS — Z794 Long term (current) use of insulin: Secondary | ICD-10-CM

## 2021-07-01 DIAGNOSIS — Z993 Dependence on wheelchair: Secondary | ICD-10-CM

## 2021-07-01 DIAGNOSIS — I10 Essential (primary) hypertension: Secondary | ICD-10-CM

## 2021-07-01 DIAGNOSIS — E785 Hyperlipidemia, unspecified: Secondary | ICD-10-CM

## 2021-07-01 LAB — POCT GLYCOSYLATED HEMOGLOBIN (HGB A1C): HbA1c, POC (controlled diabetic range): 7 % (ref 0.0–7.0)

## 2021-07-01 LAB — GLUCOSE, POCT (MANUAL RESULT ENTRY): POC Glucose: 98 mg/dl (ref 70–99)

## 2021-07-01 MED ORDER — ATORVASTATIN CALCIUM 20 MG PO TABS
ORAL_TABLET | ORAL | 3 refills | Status: DC
  Filled 2021-07-01: qty 30, 30d supply, fill #0
  Filled 2021-08-05: qty 30, 30d supply, fill #1
  Filled 2021-09-03: qty 30, 30d supply, fill #2
  Filled 2021-10-02: qty 30, 30d supply, fill #3

## 2021-07-01 MED ORDER — TECHLITE PEN NEEDLES 32G X 4 MM MISC
6 refills | Status: DC
  Filled 2021-07-01: qty 100, fill #0
  Filled 2021-07-01: qty 100, 100d supply, fill #0
  Filled 2021-08-05: qty 100, 30d supply, fill #1
  Filled 2021-09-03: qty 100, 30d supply, fill #2

## 2021-07-01 MED ORDER — METFORMIN HCL 1000 MG PO TABS
ORAL_TABLET | Freq: Two times a day (BID) | ORAL | 3 refills | Status: DC
  Filled 2021-07-01: qty 60, 30d supply, fill #0
  Filled 2021-08-05: qty 60, 30d supply, fill #1
  Filled 2021-09-03: qty 60, 30d supply, fill #2
  Filled 2021-10-02: qty 60, 30d supply, fill #3

## 2021-07-01 MED ORDER — BASAGLAR KWIKPEN 100 UNIT/ML ~~LOC~~ SOPN
PEN_INJECTOR | SUBCUTANEOUS | 11 refills | Status: DC
Start: 2021-07-01 — End: 2021-11-03
  Filled 2021-07-01: qty 6, 30d supply, fill #0
  Filled 2021-08-05: qty 6, 30d supply, fill #1
  Filled 2021-09-03: qty 6, 30d supply, fill #2
  Filled 2021-10-02: qty 6, 30d supply, fill #3

## 2021-07-01 MED ORDER — VALSARTAN-HYDROCHLOROTHIAZIDE 320-25 MG PO TABS
1.0000 | ORAL_TABLET | Freq: Every day | ORAL | 3 refills | Status: DC
  Filled 2021-07-01: qty 30, 30d supply, fill #0
  Filled 2021-08-05: qty 30, 30d supply, fill #1
  Filled 2021-09-03: qty 30, 30d supply, fill #2
  Filled 2021-10-02: qty 30, 30d supply, fill #3

## 2021-07-01 MED ORDER — SITAGLIPTIN PHOSPHATE 100 MG PO TABS
ORAL_TABLET | ORAL | 3 refills | Status: DC
  Filled 2021-07-01: qty 30, 30d supply, fill #0
  Filled 2021-08-05: qty 30, 30d supply, fill #1
  Filled 2021-09-03: qty 30, 30d supply, fill #2
  Filled 2021-10-02: qty 30, 30d supply, fill #3

## 2021-07-01 MED ORDER — OMEPRAZOLE 20 MG PO CPDR
20.0000 mg | DELAYED_RELEASE_CAPSULE | Freq: Every day | ORAL | 11 refills | Status: DC
  Filled 2021-07-01: qty 30, 30d supply, fill #0

## 2021-07-01 NOTE — Assessment & Plan Note (Signed)
Would like to see if we can get this patient a new wheelchair will partner with case management

## 2021-07-01 NOTE — Assessment & Plan Note (Signed)
A1c now down to 7.0 at goal Continue current medication profile of insulin glargine, metformin, Januvia

## 2021-07-01 NOTE — Assessment & Plan Note (Signed)
Blood pressure under good control no changes made 

## 2021-07-01 NOTE — Assessment & Plan Note (Signed)
Neurogenic bowel improved with bowel program per rehab

## 2021-07-01 NOTE — Assessment & Plan Note (Signed)
Recurrent pressure injury of the right buttocks see photographs  Will refer to wound care center

## 2021-07-01 NOTE — Assessment & Plan Note (Signed)
Hyperlipidemia we will continue atorvastatin

## 2021-07-01 NOTE — Progress Notes (Signed)
Established Patient Office Visit  Subjective:  Patient ID: John Meza, male    DOB: 1961/01/23  Age: 61 y.o. MRN: [id number]  CC:  Chief Complaint  Patient presents with   Follow-up   Diabetes    HPI John Meza presents for sacral decub, was in ED for same 05/14/21 Patient is accompanied by his spouse and language barrier overcome using Spanish video interpreter Ellwood Handler [id number] The patient was in the emergency room in early August because he had reopened a decubitus on his right gluteal area.  CT scan was done and topical exam done showing superficial infection but no deep-seated osteomyelitis.  Patient was given a course of Keflex and Bactrim which she is finished.  Patient is still requesting the potential for a wheelchair.  On arrival blood sugar is 98 A1c 7.0 blood pressure 128/81.  The patient does need a COVID booster vaccine is willing to receive this and did receive his flu vaccine at this visit.  He is yet to receive an eye exam.  He will need to go to the [facility] is at the most economical place for him as he does not have insurance coverage.  Patient maintained [employer] insulin glargine and metformin.  Patient has had all his pneumonia vaccines.  Past Medical History:  Diagnosis Date   Cellulitis and abscess of buttock 10/2016   Diabetes mellitus    Hypertension    Paraplegia (HCC) 2013   fell from ladder   Pressure ulcer, buttock, right, unstageable (HCC) 12/30/2020   SCI (spinal cord injury)    Spine fracture 11/16/2011   T 11- T9-L1    Past Surgical History:  Procedure Laterality Date   CHOLECYSTECTOMY N/A 05/01/2015   Procedure: LAPAROSCOPIC CHOLECYSTECTOMY WITH ATTEMPTED INTRAOPERATIVE CHOLANGIOGRAM;  Surgeon: Violeta Gelinas, MD;  Location: [facility] OR;  Service: General;  Laterality: N/A;   SPINE SURGERY      Family History  Problem Relation Age of Onset   Diabetes Father    Diabetes Sister     Social History   Socioeconomic History    Marital status: Married    Spouse name: Not on file   Number of children: Not on file   Years of education: Not on file   Highest education level: Not on file  Occupational History   Not on file  Tobacco Use   Smoking status: Former    Packs/day: 1.00    Years: 5.00    Pack years: 5.00    Types: Cigarettes    Quit date: 10/13/2011    Years since quitting: 9.7   Smokeless tobacco: Never   Tobacco comments:    03-24-19 per pt he stopped 1 mo ago   Vaping Use   Vaping Use: Never used  Substance and Sexual Activity   Alcohol use: Yes    Alcohol/week: 1.0 standard drink    Types: 1 Cans of beer per week   Drug use: No   Sexual activity: Not Currently  Other Topics Concern   Not on file  Social History Narrative   Not on file   Social Determinants of Health   Financial Resource Strain: Not on file  Food Insecurity: Not on file  Transportation Needs: Not on file  Physical Activity: Not on file  Stress: Not on file  Social Connections: Not on file  Intimate Partner Violence: Not on file    Outpatient Medications Prior to Visit  Medication Sig Dispense Refill   Insulin Glargine (BASAGLAR KWIKPEN) 100 UNIT/ML INJECT 20  UNITS INTO THE SKIN AT BEDTIME. 15 mL 11   Insulin Pen Needle (TRUEPLUS PEN NEEDLES) 32G X 4 MM MISC Use as directed to inject insulin 100 each 6   sitaGLIPtin (JANUVIA) 100 MG tablet Take 1 tablet by mouth daily (Patient taking differently: Take 1 tablet by mouth daily) 90 tablet 3   aspirin 81 MG EC tablet TAKE 1 TABLET (81 MG TOTAL) BY MOUTH DAILY. TAKE AFTER EATING (Patient not taking: Reported on 07/01/2021) 90 tablet 3   atorvastatin (LIPITOR) 20 MG tablet TAKE 1 TABLET (20 MG TOTAL) BY MOUTH DAILY. TO LOWER CHOLESTEROL (Patient not taking: Reported on 07/01/2021) 90 tablet 3   cephALEXin (KEFLEX) 500 MG capsule Take 1 capsule (500 mg total) by mouth 3 (three) times daily. (Patient not taking: Reported on 07/01/2021) 21 capsule 0   cephALEXin (KEFLEX) 500 MG  capsule Take 1 capsule by mouth 3 times daily. (Patient not taking: Reported on 07/01/2021) 21 capsule 0   metFORMIN (GLUCOPHAGE) 1000 MG tablet TAKE 1 TABLET (1,000 MG TOTAL) BY MOUTH 2 (TWO) TIMES DAILY WITH A MEAL. (Patient not taking: Reported on 07/01/2021) 180 tablet 3   omeprazole (PRILOSEC) 20 MG capsule Take 1 capsule by mouth daily to reduce stomach acid (Patient not taking: Reported on 07/01/2021) 30 capsule 11   sulfamethoxazole-trimethoprim (BACTRIM DS) 800-160 MG tablet Take 1 tablet by mouth twice daily for 7 days. (Patient not taking: Reported on 07/01/2021) 14 tablet 0   valsartan-hydrochlorothiazide (DIOVAN-HCT) 320-25 MG tablet Take 1 tablet by mouth daily. 90 tablet 3   No facility-administered medications prior to visit.    Allergies  Allergen Reactions   Macrobid [Nitrofurantoin]    Other Itching, Rash and Other (See Comments)    ANTIBIOTIC? CAUSED RASH, ITCHING IN 2013-PER FAMILY Possibly niacin causing flushing reaction- as described by family?    ROS Review of Systems  Constitutional:  Negative for chills, diaphoresis and fever.  HENT:  Negative for congestion, ear discharge, ear pain, hearing loss, nosebleeds, sore throat and tinnitus.   Eyes:  Negative for photophobia and discharge.  Respiratory:  Negative for cough, shortness of breath, wheezing and stridor.        No excess mucus  Cardiovascular:  Negative for chest pain, palpitations and leg swelling.  Gastrointestinal:  Negative for abdominal pain, blood in stool, constipation, diarrhea, nausea and vomiting.  Endocrine: Negative for polydipsia.  Genitourinary:  Negative for dysuria, flank pain, frequency, hematuria and urgency.  Musculoskeletal:  Negative for back pain, myalgias and neck pain.  Skin:  Positive for wound. Negative for rash.  Allergic/Immunologic: Negative for environmental allergies.  Neurological:  Negative for dizziness, tremors, seizures, weakness and headaches.  Hematological:  Does not  bruise/bleed easily.  Psychiatric/Behavioral:  Negative for hallucinations and suicidal ideas. The patient is not nervous/anxious.   All other systems reviewed and are negative.    Objective:    Physical Exam Vitals reviewed.  Constitutional:      Appearance: Normal appearance. He is well-developed. He is not diaphoretic.  HENT:     Head: Normocephalic and atraumatic.     Nose: No nasal deformity, septal deviation, mucosal edema or rhinorrhea.     Right Sinus: No maxillary sinus tenderness or frontal sinus tenderness.     Left Sinus: No maxillary sinus tenderness or frontal sinus tenderness.     Mouth/Throat:     Pharynx: No oropharyngeal exudate.  Eyes:     General: No scleral icterus.    Conjunctiva/sclera: Conjunctivae normal.  Pupils: Pupils are equal, round, and reactive to light.  Neck:     Thyroid: No thyromegaly.     Vascular: No carotid bruit or JVD.     Trachea: Trachea normal. No tracheal tenderness or tracheal deviation.  Cardiovascular:     Rate and Rhythm: Normal rate and regular rhythm.     Chest Wall: PMI is not displaced.     Pulses: Normal pulses. No decreased pulses.     Heart sounds: Normal heart sounds, S1 normal and S2 normal. Heart sounds not distant. No murmur heard. No systolic murmur is present.  No diastolic murmur is present.    No friction rub. No gallop. No S3 or S4 sounds.  Pulmonary:     Effort: Pulmonary effort is normal. No tachypnea, accessory muscle usage or respiratory distress.     Breath sounds: Normal breath sounds. No stridor. No decreased breath sounds, wheezing, rhonchi or rales.  Chest:     Chest wall: No tenderness.  Abdominal:     General: Bowel sounds are normal. There is no distension.     Palpations: Abdomen is soft. Abdomen is not rigid.     Tenderness: There is no abdominal tenderness. There is no guarding or rebound.  Musculoskeletal:        General: Normal range of motion.     Cervical back: Normal range of motion  and neck supple. No edema, erythema or rigidity. No muscular tenderness. Normal range of motion.  Lymphadenopathy:     Head:     Right side of head: No submental or submandibular adenopathy.     Left side of head: No submental or submandibular adenopathy.     Cervical: No cervical adenopathy.  Skin:    General: Skin is warm and dry.     Coloration: Skin is not pale.     Findings: Lesion present. No rash.     Nails: There is no clubbing.     Comments: See photograph of gluteal pressure ulcer on the right  Neurological:     Mental Status: He is alert and oriented to person, place, and time.     Sensory: No sensory deficit.  Psychiatric:        Mood and Affect: Mood normal.        Speech: Speech normal.        Behavior: Behavior normal.    BP 128/81   Pulse 74   Resp 16   SpO2 100%  Wt Readings from Last 3 Encounters:  05/14/21 169 lb 1.5 oz (76.7 kg)  12/17/17 169 lb (76.7 kg)  11/03/16 169 lb 8 oz (76.9 kg)     Health Maintenance Due  Topic Date Due   OPHTHALMOLOGY EXAM  Never done   Zoster Vaccines- Shingrix (1 of 2) Never done   COVID-19 Vaccine (4 - Booster for Pfizer series) 01/30/2021    There are no preventive care reminders to display for this patient.  Lab Results  Component Value Date   TSH 3.980 07/01/2018   Lab Results  Component Value Date   WBC 8.7 05/14/2021   HGB 11.2 (L) 05/14/2021   HCT 33.0 (L) 05/14/2021   MCV 88.2 05/14/2021   PLT 245 05/14/2021   Lab Results  Component Value Date   NA 134 (L) 05/14/2021   K 4.6 05/14/2021   CO2 21 (L) 05/14/2021   GLUCOSE 158 (H) 05/14/2021   BUN 18 05/14/2021   CREATININE 0.94 05/14/2021   BILITOT 0.5 05/14/2021   ALKPHOS 92  05/14/2021   AST 15 05/14/2021   ALT 15 05/14/2021   PROT 6.7 05/14/2021   ALBUMIN 2.8 (L) 05/14/2021   CALCIUM 9.1 05/14/2021   ANIONGAP 10 05/14/2021   Lab Results  Component Value Date   CHOL 110 12/28/2019   Lab Results  Component Value Date   HDL 40 12/28/2019    Lab Results  Component Value Date   LDLCALC 50 12/28/2019   Lab Results  Component Value Date   TRIG 110 12/28/2019   Lab Results  Component Value Date   CHOLHDL 2.8 12/28/2019   Lab Results  Component Value Date   HGBA1C 7.0 07/01/2021   Media Information Document Information  Photos  Gluteal pressure sore  07/01/2021 09:52  Attached To:  Office Visit on 07/01/21 with Storm Frisk, MD   Source Information  Storm Frisk, MD  [facility]     Assessment & Plan:   Problem List Items Addressed This Visit       Cardiovascular and Mediastinum   Essential hypertension    Blood pressure under good control no changes made      Relevant Medications   atorvastatin (LIPITOR) 20 MG tablet   valsartan-hydrochlorothiazide (DIOVAN-HCT) 320-25 MG tablet     Digestive   Neurogenic bowel    Neurogenic bowel improved with bowel program per rehab        Endocrine   Insulin dependent type 2 diabetes mellitus, controlled (HCC) - Primary    A1c now down to 7.0 at goal Continue current medication profile of insulin glargine, metformin, Januvia      Relevant Medications   atorvastatin (LIPITOR) 20 MG tablet   Insulin Glargine (BASAGLAR KWIKPEN) 100 UNIT/ML   Insulin Pen Needle (TECHLITE PEN NEEDLES) 32G X 4 MM MISC   metFORMIN (GLUCOPHAGE) 1000 MG tablet   sitaGLIPtin (JANUVIA) 100 MG tablet   valsartan-hydrochlorothiazide (DIOVAN-HCT) 320-25 MG tablet     Musculoskeletal and Integument   Pressure injury of right buttock, stage 2 (HCC)    Recurrent pressure injury of the right buttocks see photographs  Will refer to wound care center      Relevant Orders   AMB referral to wound care center     Other   HLD (hyperlipidemia)    Hyperlipidemia we will continue atorvastatin      Relevant Medications   atorvastatin (LIPITOR) 20 MG tablet   valsartan-hydrochlorothiazide (DIOVAN-HCT) 320-25 MG tablet   Wheelchair dependence    Would like to  see if we can get this patient a new wheelchair will partner with case management      Relevant Orders   AMB referral to wound care center   Other Visit Diagnoses     Need for immunization against influenza       Relevant Orders   Flu Vaccine QUAD 3mo+IM (Fluarix, Fluzone & Alfiuria Quad PF) (Completed)       Meds ordered this encounter  Medications   atorvastatin (LIPITOR) 20 MG tablet    Sig: TAKE 1 TABLET (20 MG TOTAL) BY MOUTH DAILY. TO LOWER CHOLESTEROL    Dispense:  90 tablet    Refill:  3   Insulin Glargine (BASAGLAR KWIKPEN) 100 UNIT/ML    Sig: INJECT 20 UNITS INTO THE SKIN AT BEDTIME.    Dispense:  15 mL    Refill:  11   Insulin Pen Needle (TECHLITE PEN NEEDLES) 32G X 4 MM MISC    Sig: Use as directed to inject insulin  Dispense:  100 each    Refill:  6   metFORMIN (GLUCOPHAGE) 1000 MG tablet    Sig: TAKE 1 TABLET (1,000 MG TOTAL) BY MOUTH 2 (TWO) TIMES DAILY WITH A MEAL.    Dispense:  180 tablet    Refill:  3   omeprazole (PRILOSEC) 20 MG capsule    Sig: Take 1 capsule by mouth daily to reduce stomach acid    Dispense:  30 capsule    Refill:  11   sitaGLIPtin (JANUVIA) 100 MG tablet    Sig: Take 1 tablet by mouth daily    Dispense:  90 tablet    Refill:  3   valsartan-hydrochlorothiazide (DIOVAN-HCT) 320-25 MG tablet    Sig: Take 1 tablet by mouth daily.    Dispense:  90 tablet    Refill:  3    38 minutes spent on this visit including taking history performing a physical exam giving patient education overcoming Spanish language barrier with video interpreter complex decision making multiple medical problems including new onset pressure sore right gluteal area Follow-up: Return in about 4 months (around 10/31/2021).    [name], MD

## 2021-07-01 NOTE — Patient Instructions (Signed)
Please obtain a COVID by bivalent booster vaccine Pfizer you can go to any [facility] or [facility] or [facility]  Refills on all medications sent to her pharmacy no medication changes  Please go to the [facility] we suggested for an eye exam you will need to call to make an appointment  Flu vaccine was given  Outstanding work your hemoglobin A1c is 7 and blood pressure is excellent at 128/81  Referral to the wound care center for your gluteal ulcer on your buttocks will be made  Return to see Dr. [name] in 4 months  Obtenga una vacuna de refuerzo COVID por [employer], puede ir a [employer], [facility] o [facility]  Resurtidos de todos los medicamentos enviados a su farmacia sin cambios en los medicamentos  Vaya al [facility] que sugerimos para un examen de la vista, deber llamar para programar una cita.  Se administr la vacuna contra la influenza  Excelente trabajo, su hemoglobina A1c es 7 y la presin arterial es excelente en 128/81  Se realizar una remisin al centro de atencin de heridas por su lcera gltea en los glteos.  Volver a ver al Dr. [name] en 4 meses

## 2021-08-05 ENCOUNTER — Other Ambulatory Visit: Payer: Self-pay

## 2021-09-03 ENCOUNTER — Other Ambulatory Visit: Payer: Self-pay

## 2021-09-24 ENCOUNTER — Other Ambulatory Visit: Payer: Self-pay

## 2021-10-02 ENCOUNTER — Other Ambulatory Visit: Payer: Self-pay

## 2021-10-20 ENCOUNTER — Other Ambulatory Visit: Payer: Self-pay

## 2021-11-02 NOTE — Progress Notes (Signed)
Established Patient Office Visit  Subjective:  Patient ID: John Meza, male    DOB: 11-Jul-1961  Age: 61 y.o. 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  He has had sacral decubiti which have now resolved.  He still has his original wheelchair and is not in good working order and needs a new wheelchair.  We attempted to order this 6 months ago but there was no processing of the order as he is uninsured.  Note the patient did undergo an eye exam on 14 January at the [facility] and it was negative for diabetic retinopathy.  His A1c is 8.0.  His blood sugars at home of been in the 113 130 range.  Blood sugar today is 83.  He does need lab screenings today including liver function renal function.  The patient has no other real complaints except he has occasional back spasms when he lays flat in the bed.  On arrival blood pressure is good 130/80.    There are no pending vaccines for this patient.   Past Medical History:  Diagnosis Date   Cellulitis and abscess of buttock 10/2016   Diabetes mellitus    Hypertension    Paraplegia (HCC) 2013   fell from ladder   Pressure ulcer, buttock, right, unstageable (HCC) 12/30/2020   Sacral decubitus ulcer, stage IV (HCC) 11/03/2021   SCI (spinal cord injury)    Spine fracture 11/16/2011   T 11- T9-L1    Past Surgical History:  Procedure Laterality Date   CHOLECYSTECTOMY N/A 05/01/2015   Procedure: LAPAROSCOPIC CHOLECYSTECTOMY WITH ATTEMPTED INTRAOPERATIVE CHOLANGIOGRAM;  Surgeon: Violeta Gelinas, MD;  Location: [facility] OR;  Service: General;  Laterality: N/A;   SPINE SURGERY      Family History  Problem Relation Age of Onset   Diabetes Father    Diabetes Sister     Social History   Socioeconomic  History   Marital status: Married    Spouse name: Not on file   Number of children: Not on file   Years of education: Not on file   Highest education level: Not on file  Occupational History   Not on file  Tobacco Use   Smoking status: Former    Packs/day: 1.00    Years: 5.00    Pack years: 5.00    Types: Cigarettes    Quit date: 10/13/2011    Years since quitting: 10.0   Smokeless tobacco: Never   Tobacco comments:    03-24-19 per pt he stopped 1 mo ago   Vaping Use   Vaping Use: Never used  Substance and Sexual Activity   Alcohol use: Yes    Alcohol/week: 1.0 standard drink    Types: 1 Cans of beer per week   Drug use: No   Sexual activity: Not Currently  Other Topics Concern   Not on file  Social History Narrative   Not on file   Social Determinants of Health   Financial Resource Strain: Not on file  Food Insecurity: Not on file  Transportation Needs: Not on file  Physical Activity: Not on file  Stress: Not on file  Social Connections: Not on file  Intimate Partner Violence: Not on file    Outpatient Medications Prior to Visit  Medication Sig Dispense Refill   aspirin 81  MG EC tablet TAKE 1 TABLET (81 MG TOTAL) BY MOUTH DAILY. TAKE AFTER EATING 90 tablet 3   atorvastatin (LIPITOR) 20 MG tablet TAKE 1 TABLET (20 MG TOTAL) BY MOUTH DAILY. TO LOWER CHOLESTEROL 90 tablet 3   Insulin Glargine (BASAGLAR KWIKPEN) 100 UNIT/ML INJECT 20 UNITS INTO THE SKIN AT BEDTIME. 15 mL 11   Insulin Pen Needle (TECHLITE PEN NEEDLES) 32G X 4 MM MISC Use as directed to inject insulin 100 each 6   metFORMIN (GLUCOPHAGE) 1000 MG tablet TAKE 1 TABLET (1,000 MG TOTAL) BY MOUTH 2 (TWO) TIMES DAILY WITH A MEAL. 180 tablet 3   omeprazole (PRILOSEC) 20 MG capsule Take 1 capsule by mouth daily to reduce stomach acid 30 capsule 11   sitaGLIPtin (JANUVIA) 100 MG tablet Take 1 tablet by mouth daily 90 tablet 3   valsartan-hydrochlorothiazide (DIOVAN-HCT) 320-25 MG tablet Take 1 tablet by mouth daily.  90 tablet 3   atorvastatin (LIPITOR) 20 MG tablet TAKE 1 TABLET (20 MG TOTAL) BY MOUTH DAILY. TO LOWER CHOLESTEROL (Patient not taking: Reported on 11/03/2021) 90 tablet 3   No facility-administered medications prior to visit.    Allergies  Allergen Reactions   Macrobid [Nitrofurantoin]    Other Itching, Rash and Other (See Comments)    ANTIBIOTIC? CAUSED RASH, ITCHING IN 2013-PER FAMILY Possibly niacin causing flushing reaction- as described by family?    ROS Review of Systems  Constitutional:  Negative for chills, diaphoresis and fever.  HENT:  Negative for congestion, hearing loss, nosebleeds, sore throat and tinnitus.   Eyes:  Negative for photophobia and redness.  Respiratory:  Negative for cough, shortness of breath, wheezing and stridor.   Cardiovascular:  Negative for chest pain, palpitations and leg swelling.  Gastrointestinal:  Negative for abdominal pain, blood in stool, constipation, diarrhea, nausea and vomiting.  Endocrine: Negative for polydipsia.  Genitourinary:  Negative for dysuria, flank pain, frequency, hematuria and urgency.  Musculoskeletal:  Positive for arthralgias and back pain. Negative for myalgias and neck pain.  Skin:  Negative for rash.  Allergic/Immunologic: Negative for environmental allergies.  Neurological:  Negative for dizziness, tremors, seizures, weakness and headaches.  Hematological:  Does not bruise/bleed easily.  Psychiatric/Behavioral:  Negative for suicidal ideas. The patient is not nervous/anxious.      Objective:    Physical Exam Vitals reviewed.  Constitutional:      Appearance: Normal appearance. He is well-developed. He is not diaphoretic.  HENT:     Head: Normocephalic and atraumatic.     Nose: Nose normal. No nasal deformity, septal deviation, mucosal edema or rhinorrhea.     Right Sinus: No maxillary sinus tenderness or frontal sinus tenderness.     Left Sinus: No maxillary sinus tenderness or frontal sinus tenderness.      Mouth/Throat:     Mouth: Mucous membranes are moist.     Pharynx: Oropharynx is clear. No oropharyngeal exudate.  Eyes:     General: No scleral icterus.    Conjunctiva/sclera: Conjunctivae normal.     Pupils: Pupils are equal, round, and reactive to light.  Neck:     Thyroid: No thyromegaly.     Vascular: No carotid bruit or JVD.     Trachea: Trachea normal. No tracheal tenderness or tracheal deviation.  Cardiovascular:     Rate and Rhythm: Normal rate and regular rhythm.     Chest Wall: PMI is not displaced.     Pulses: Normal pulses. No decreased pulses.     Heart sounds: Normal heart sounds,  S1 normal and S2 normal. Heart sounds not distant. No murmur heard. No systolic murmur is present.  No diastolic murmur is present.    No friction rub. No gallop. No S3 or S4 sounds.  Pulmonary:     Effort: No tachypnea, accessory muscle usage or respiratory distress.     Breath sounds: No stridor. No decreased breath sounds, wheezing, rhonchi or rales.  Chest:     Chest wall: No tenderness.  Abdominal:     General: Bowel sounds are normal. There is no distension.     Palpations: Abdomen is soft. Abdomen is not rigid.     Tenderness: There is no abdominal tenderness. There is no guarding or rebound.  Musculoskeletal:        General: Normal range of motion.     Cervical back: Normal range of motion and neck supple. No edema, erythema or rigidity. No muscular tenderness. Normal range of motion.  Lymphadenopathy:     Head:     Right side of head: No submental or submandibular adenopathy.     Left side of head: No submental or submandibular adenopathy.     Cervical: No cervical adenopathy.  Skin:    General: Skin is warm and dry.     Coloration: Skin is not pale.     Findings: No lesion or rash.     Nails: There is no clubbing.     Comments: There is no sacral decubitus  Neurological:     Mental Status: He is alert and oriented to person, place, and time. Mental status is at baseline.      Sensory: No sensory deficit.     Comments: Patient maintains bilateral paraplegia  Psychiatric:        Speech: Speech normal.        Behavior: Behavior normal.    BP 130/80    Pulse 74  Wt Readings from Last 3 Encounters:  05/14/21 169 lb 1.5 oz (76.7 kg)  12/17/17 169 lb (76.7 kg)  11/03/16 169 lb 8 oz (76.9 kg)     Health Maintenance Due  Topic Date Due   OPHTHALMOLOGY EXAM  Never done   Zoster Vaccines- Shingrix (1 of 2) Never done   COVID-19 Vaccine (4 - Booster for Pfizer series) 01/02/2021    There are no preventive care reminders to display for this patient.  Lab Results  Component Value Date   TSH 3.980 07/01/2018   Lab Results  Component Value Date   WBC 8.7 05/14/2021   HGB 11.2 (L) 05/14/2021   HCT 33.0 (L) 05/14/2021   MCV 88.2 05/14/2021   PLT 245 05/14/2021   Lab Results  Component Value Date   NA 134 (L) 05/14/2021   K 4.6 05/14/2021   CO2 21 (L) 05/14/2021   GLUCOSE 158 (H) 05/14/2021   BUN 18 05/14/2021   CREATININE 0.94 05/14/2021   BILITOT 0.5 05/14/2021   ALKPHOS 92 05/14/2021   AST 15 05/14/2021   ALT 15 05/14/2021   PROT 6.7 05/14/2021   ALBUMIN 2.8 (L) 05/14/2021   CALCIUM 9.1 05/14/2021   ANIONGAP 10 05/14/2021   Lab Results  Component Value Date   CHOL 110 12/28/2019   Lab Results  Component Value Date   HDL 40 12/28/2019   Lab Results  Component Value Date   LDLCALC 50 12/28/2019   Lab Results  Component Value Date   TRIG 110 12/28/2019   Lab Results  Component Value Date   CHOLHDL 2.8 12/28/2019   Lab  Results  Component Value Date   HGBA1C 8.0 (A) 11/03/2021      Assessment & Plan:   Problem List Items Addressed This Visit       Cardiovascular and Mediastinum   Essential hypertension    Blood pressure at goal continue current dose of amlodipine valsartan HCT  Check renal function      Relevant Medications   atorvastatin (LIPITOR) 20 MG tablet   valsartan-hydrochlorothiazide (DIOVAN-HCT) 320-25 MG  tablet     Endocrine   Insulin dependent type 2 diabetes mellitus, controlled ([place]) - Primary    A1c is up to 8.0 on point-of-care testing we will recheck this with a lab draw because I am uncertain this is accurate given that he glycemic control he is demonstrating  For now we will continue with insulin glargine, metformin, Januvia as prescribed  Patient to continue Lipitor as well      Relevant Medications   atorvastatin (LIPITOR) 20 MG tablet   valsartan-hydrochlorothiazide (DIOVAN-HCT) 320-25 MG tablet   Insulin Glargine (BASAGLAR KWIKPEN) 100 UNIT/ML   Insulin Pen Needle (TECHLITE PEN NEEDLES) 32G X 4 MM MISC   metFORMIN (GLUCOPHAGE) 1000 MG tablet   sitaGLIPtin (JANUVIA) 100 MG tablet   Other Relevant Orders   POCT glucose (manual entry) (Completed)   POCT glycosylated hemoglobin (Hb A1C) (Completed)   Comprehensive metabolic panel   Hemoglobin A1c   RESOLVED: Uncontrolled type 2 diabetes mellitus with hyperglycemia (HCC)   Relevant Medications   atorvastatin (LIPITOR) 20 MG tablet   valsartan-hydrochlorothiazide (DIOVAN-HCT) 320-25 MG tablet   Insulin Glargine (BASAGLAR KWIKPEN) 100 UNIT/ML   Insulin Pen Needle (TECHLITE PEN NEEDLES) 32G X 4 MM MISC   metFORMIN (GLUCOPHAGE) 1000 MG tablet   sitaGLIPtin (JANUVIA) 100 MG tablet     Nervous and Auditory   Paraplegia (HCC)    Patient is still wheelchair-bound and has wheelchair and is in need of replacement we will order another wheelchair and attempt to use the patient assistance fund to pay for this as he is uninsured      Relevant Orders   DME Wheelchair manual     Other   HLD (hyperlipidemia)    Continue atorvastatin      Relevant Medications   atorvastatin (LIPITOR) 20 MG tablet   valsartan-hydrochlorothiazide (DIOVAN-HCT) 320-25 MG tablet   Wheelchair dependence    As per paraplegic assessment      RESOLVED: Sacral decubitus ulcer, stage IV ([place])    This has resolved       Meds ordered this encounter   Medications   DISCONTD: atorvastatin (LIPITOR) 20 MG tablet    Sig: TAKE 1 TABLET (20 MG TOTAL) BY MOUTH DAILY. TO LOWER CHOLESTEROL    Dispense:  90 tablet    Refill:  3   DISCONTD: Insulin Glargine (BASAGLAR KWIKPEN) 100 UNIT/ML    Sig: INJECT 20 UNITS INTO THE SKIN AT BEDTIME.    Dispense:  15 mL    Refill:  11   DISCONTD: Insulin Pen Needle (TECHLITE PEN NEEDLES) 32G X 4 MM MISC    Sig: Use as directed to inject insulin    Dispense:  100 each    Refill:  6   DISCONTD: metFORMIN (GLUCOPHAGE) 1000 MG tablet    Sig: TAKE 1 TABLET (1,000 MG TOTAL) BY MOUTH 2 (TWO) TIMES DAILY WITH A MEAL.    Dispense:  180 tablet    Refill:  3   DISCONTD: sitaGLIPtin (JANUVIA) 100 MG tablet    Sig: Take 1 tablet  by mouth daily    Dispense:  90 tablet    Refill:  3   DISCONTD: valsartan-hydrochlorothiazide (DIOVAN-HCT) 320-25 MG tablet    Sig: Take 1 tablet by mouth daily.    Dispense:  90 tablet    Refill:  3   atorvastatin (LIPITOR) 20 MG tablet    Sig: TAKE 1 TABLET (20 MG TOTAL) BY MOUTH DAILY. TO LOWER CHOLESTEROL    Dispense:  90 tablet    Refill:  3   valsartan-hydrochlorothiazide (DIOVAN-HCT) 320-25 MG tablet    Sig: Take 1 tablet by mouth daily.    Dispense:  90 tablet    Refill:  3   Insulin Glargine (BASAGLAR KWIKPEN) 100 UNIT/ML    Sig: INJECT 20 UNITS INTO THE SKIN AT BEDTIME.    Dispense:  15 mL    Refill:  11   Insulin Pen Needle (TECHLITE PEN NEEDLES) 32G X 4 MM MISC    Sig: Use as directed to inject insulin    Dispense:  100 each    Refill:  6   metFORMIN (GLUCOPHAGE) 1000 MG tablet    Sig: TAKE 1 TABLET (1,000 MG TOTAL) BY MOUTH 2 (TWO) TIMES DAILY WITH A MEAL.    Dispense:  180 tablet    Refill:  3   sitaGLIPtin (JANUVIA) 100 MG tablet    Sig: Take 1 tablet by mouth daily    Dispense:  90 tablet    Refill:  3   omeprazole (PRILOSEC) 20 MG capsule    Sig: Take 1 capsule by mouth daily to reduce stomach acid    Dispense:  30 capsule    Refill:  11   methocarbamol  (ROBAXIN) 500 MG tablet    Sig: Take 1 tablet (500 mg total) by mouth every 6 (six) hours as needed for muscle spasms.    Dispense:  90 tablet    Refill:  2    Follow-up: No follow-ups on file.    Asencion Noble, MD

## 2021-11-03 ENCOUNTER — Other Ambulatory Visit: Payer: Self-pay

## 2021-11-03 ENCOUNTER — Ambulatory Visit: Payer: Self-pay | Attending: Critical Care Medicine | Admitting: Critical Care Medicine

## 2021-11-03 ENCOUNTER — Telehealth: Payer: Self-pay

## 2021-11-03 ENCOUNTER — Encounter: Payer: Self-pay | Admitting: Critical Care Medicine

## 2021-11-03 VITALS — BP 130/80 | HR 74

## 2021-11-03 DIAGNOSIS — G8220 Paraplegia, unspecified: Secondary | ICD-10-CM

## 2021-11-03 DIAGNOSIS — I10 Essential (primary) hypertension: Secondary | ICD-10-CM

## 2021-11-03 DIAGNOSIS — E1165 Type 2 diabetes mellitus with hyperglycemia: Secondary | ICD-10-CM | POA: Insufficient documentation

## 2021-11-03 DIAGNOSIS — E785 Hyperlipidemia, unspecified: Secondary | ICD-10-CM

## 2021-11-03 DIAGNOSIS — L89154 Pressure ulcer of sacral region, stage 4: Secondary | ICD-10-CM

## 2021-11-03 DIAGNOSIS — Z794 Long term (current) use of insulin: Secondary | ICD-10-CM

## 2021-11-03 DIAGNOSIS — E119 Type 2 diabetes mellitus without complications: Secondary | ICD-10-CM

## 2021-11-03 DIAGNOSIS — Z993 Dependence on wheelchair: Secondary | ICD-10-CM

## 2021-11-03 HISTORY — DX: Pressure ulcer of sacral region, stage 4: L89.154

## 2021-11-03 LAB — POCT GLYCOSYLATED HEMOGLOBIN (HGB A1C): HbA1c, POC (controlled diabetic range): 8 % — AB (ref 0.0–7.0)

## 2021-11-03 LAB — GLUCOSE, POCT (MANUAL RESULT ENTRY): POC Glucose: 83 mg/dl (ref 70–99)

## 2021-11-03 MED ORDER — ATORVASTATIN CALCIUM 20 MG PO TABS
ORAL_TABLET | ORAL | 3 refills | Status: DC
  Filled 2021-12-04: qty 30, 30d supply, fill #0
  Filled 2022-01-05: qty 30, 30d supply, fill #1
  Filled 2022-02-02: qty 30, 30d supply, fill #2
  Filled 2022-03-11: qty 30, 30d supply, fill #3

## 2021-11-03 MED ORDER — METFORMIN HCL 1000 MG PO TABS
ORAL_TABLET | Freq: Two times a day (BID) | ORAL | 3 refills | Status: DC
  Filled 2021-12-04: qty 60, 30d supply, fill #0
  Filled 2022-01-05: qty 60, 30d supply, fill #1
  Filled 2022-02-02: qty 60, 30d supply, fill #2
  Filled 2022-03-11: qty 60, 30d supply, fill #3

## 2021-11-03 MED ORDER — SITAGLIPTIN PHOSPHATE 100 MG PO TABS
ORAL_TABLET | ORAL | 3 refills | Status: DC
  Filled 2021-11-03: qty 90, fill #0

## 2021-11-03 MED ORDER — VALSARTAN-HYDROCHLOROTHIAZIDE 320-25 MG PO TABS
1.0000 | ORAL_TABLET | Freq: Every day | ORAL | 3 refills | Status: DC
  Filled 2021-12-04: qty 30, 30d supply, fill #0
  Filled 2022-01-05: qty 30, 30d supply, fill #1
  Filled 2022-02-02: qty 30, 30d supply, fill #2
  Filled 2022-03-11: qty 30, 30d supply, fill #3

## 2021-11-03 MED ORDER — VALSARTAN-HYDROCHLOROTHIAZIDE 320-25 MG PO TABS
1.0000 | ORAL_TABLET | Freq: Every day | ORAL | 3 refills | Status: DC
  Filled 2021-11-03: qty 30, 30d supply, fill #0

## 2021-11-03 MED ORDER — TECHLITE PEN NEEDLES 32G X 4 MM MISC
6 refills | Status: DC
  Filled 2022-02-02: qty 100, 25d supply, fill #0
  Filled 2022-03-11: qty 100, 25d supply, fill #1

## 2021-11-03 MED ORDER — METFORMIN HCL 1000 MG PO TABS
ORAL_TABLET | Freq: Two times a day (BID) | ORAL | 3 refills | Status: DC
  Filled 2021-11-03: qty 60, 30d supply, fill #0

## 2021-11-03 MED ORDER — OMEPRAZOLE 20 MG PO CPDR
20.0000 mg | DELAYED_RELEASE_CAPSULE | Freq: Every day | ORAL | 11 refills | Status: DC
  Filled 2021-11-03: qty 30, 30d supply, fill #0
  Filled 2022-02-02: qty 30, 30d supply, fill #1

## 2021-11-03 MED ORDER — METHOCARBAMOL 500 MG PO TABS
500.0000 mg | ORAL_TABLET | Freq: Four times a day (QID) | ORAL | 2 refills | Status: DC | PRN
  Filled 2021-11-03: qty 90, 23d supply, fill #0
  Filled 2022-02-02: qty 90, 23d supply, fill #1

## 2021-11-03 MED ORDER — BASAGLAR KWIKPEN 100 UNIT/ML ~~LOC~~ SOPN
PEN_INJECTOR | SUBCUTANEOUS | 11 refills | Status: DC
  Filled 2021-11-03: qty 6, 30d supply, fill #0

## 2021-11-03 MED ORDER — TECHLITE PEN NEEDLES 32G X 4 MM MISC
6 refills | Status: DC
  Filled 2021-11-03: qty 100, 90d supply, fill #0

## 2021-11-03 MED ORDER — BASAGLAR KWIKPEN 100 UNIT/ML ~~LOC~~ SOPN
PEN_INJECTOR | SUBCUTANEOUS | 11 refills | Status: DC
  Filled 2021-12-04: qty 6, 30d supply, fill #0
  Filled 2022-01-05: qty 6, 30d supply, fill #1

## 2021-11-03 MED ORDER — ATORVASTATIN CALCIUM 20 MG PO TABS
ORAL_TABLET | ORAL | 3 refills | Status: DC
  Filled 2021-11-03: qty 30, 30d supply, fill #0

## 2021-11-03 NOTE — Assessment & Plan Note (Signed)
Patient is still wheelchair-bound and has wheelchair and is in need of replacement we will order another wheelchair and attempt to use the patient assistance fund to pay for this as he is uninsured

## 2021-11-03 NOTE — Assessment & Plan Note (Signed)
A1c is up to 8.0 on point-of-care testing we will recheck this with a lab draw because I am uncertain this is accurate given that he glycemic control he is demonstrating  For now we will continue with insulin glargine, metformin, Januvia as prescribed  Patient to continue Lipitor as well

## 2021-11-03 NOTE — Assessment & Plan Note (Signed)
Continue atorvastatin

## 2021-11-03 NOTE — Assessment & Plan Note (Signed)
Blood pressure at goal continue current dose of amlodipine valsartan HCT  Check renal function

## 2021-11-03 NOTE — Assessment & Plan Note (Signed)
This has resolved.

## 2021-11-03 NOTE — Assessment & Plan Note (Signed)
As per paraplegic assessment

## 2021-11-03 NOTE — Patient Instructions (Signed)
We will look into getting you a wheelchair replacement  Complete set of labs obtained today  No change in medications for now refill sent to our pharmacy which is a new location [facility] first floor  Begin methocarbamol 500 mg at bedtime for muscle spasm as needed  Return to see Dr. [name] 3 months in our new location [address] [facility]  Buscaremos conseguirle un reemplazo de silla de ruedas  Juego completo de laboratorios obtenido hoy  No hay cambios en los medicamentos por Geographical information systems officer, reabastecimiento enviado a Insurance claims handler, que es una nueva ubicacin en el primer piso del [facility]  Comenzar metocarbamol 500 mg a la hora de acostarse para el espasmo muscular segn sea necesario  Regrese para ver al Dr. [name] 3 meses en nuestra nueva ubicacin [address] [facility]

## 2021-11-03 NOTE — Telephone Encounter (Signed)
Message sent to [facility] requesting funding for a new manual wheelchair and pressure relieving cushion for the patient.

## 2021-11-04 ENCOUNTER — Telehealth: Payer: Self-pay

## 2021-11-04 LAB — COMPREHENSIVE METABOLIC PANEL
ALT: 15 IU/L (ref 0–44)
AST: 14 IU/L (ref 0–40)
Albumin/Globulin Ratio: 1.4 (ref 1.2–2.2)
Albumin: 4 g/dL (ref 3.8–4.9)
Alkaline Phosphatase: 112 IU/L (ref 44–121)
BUN/Creatinine Ratio: 22 — ABNORMAL HIGH (ref 9–20)
BUN: 21 mg/dL (ref 6–24)
Bilirubin Total: 0.3 mg/dL (ref 0.0–1.2)
CO2: 20 mmol/L (ref 20–29)
Calcium: 9.4 mg/dL (ref 8.7–10.2)
Chloride: 103 mmol/L (ref 96–106)
Creatinine, Ser: 0.96 mg/dL (ref 0.76–1.27)
Globulin, Total: 2.9 g/dL (ref 1.5–4.5)
Glucose: 89 mg/dL (ref 70–99)
Potassium: 4.6 mmol/L (ref 3.5–5.2)
Sodium: 138 mmol/L (ref 134–144)
Total Protein: 6.9 g/dL (ref 6.0–8.5)
eGFR: 92 mL/min/{1.73_m2} (ref >59)

## 2021-11-04 LAB — HEMOGLOBIN A1C
Est. average glucose Bld gHb Est-mCnc: 186 mg/dL
Hgb A1c MFr Bld: 8.1 % — ABNORMAL HIGH (ref 4.8–5.6)

## 2021-11-04 NOTE — Telephone Encounter (Signed)
Pt was called and vm wasn't left. Information was sent to nurse pool and letter will be sent.   Interpreter ID# [phone]

## 2021-11-04 NOTE — Telephone Encounter (Signed)
-----   Message from Storm Frisk, MD sent at 11/04/2021  5:54 AM EST ----- Let pt labs all normal.  A1C still high, needs to focus on healthy food choices, Roye Gustafson pls get him in with [facility] in three weeks to review his diabetes regimen

## 2021-11-04 NOTE — Telephone Encounter (Signed)
Order for manual wheelchair faxed to [employer] [facility] 

## 2021-11-05 NOTE — Telephone Encounter (Addendum)
Message received from [facility] # [phone] inquiring if patient has insurance coverage for the wheelchair.   Call returned to [place] and explained that the patient does not have insurance for the wheelchair and there may be patient assistance funding for the cost of the wheelchair.  she said she will need to have the department that has the pricing call this CM back   # [phone]   Calls received from Dominique/[employer].  This CM explained that an exact cost for the wheelchair and accessories is needed to determine if patient assistance funding an be used to purchase a wheelchair for the patient. She said an out of pocket expense for the wheelchair is $809.91  but she needs to determine if that includes the accessories that were ordered.

## 2021-11-06 NOTE — Telephone Encounter (Signed)
Call returned to [place], spoke to [facility] who said that John Meza was gone for the day.  Message left with [name] for John Meza to return the call to this CM.  [name] then said she would try to identify the exact cost for the wheelchair and call this CM back.

## 2021-11-06 NOTE — Telephone Encounter (Signed)
John Meza with [employer] [facility] calling to speak to John Meza. She states that she is needing more details regarding fund which include what kind of fund/who they would bill/ who they would contact. Please advise   [phone]

## 2021-11-10 NOTE — Telephone Encounter (Signed)
Call received from Nancy/ [employer] [facility] who reported the cost for purchase of a wheelchair with anti-tippers, elevated leg rests would be $1090.32.  She explained that the payment can be made to any representative with [employer] at main number # [phone] because the cost is documented in the patient's record.  Will need to share this information with the [facility] Patient Assistance.

## 2021-11-13 NOTE — Telephone Encounter (Signed)
Message sent to [facility] Patient Assistance with the price quoted from [place] for the wheelchair.  I also asked if the [facility] would pay for a wheelchair that is ordered on-line such as from [employer].

## 2021-11-25 ENCOUNTER — Ambulatory Visit: Payer: No Typology Code available for payment source

## 2021-12-04 ENCOUNTER — Other Ambulatory Visit: Payer: Self-pay

## 2021-12-12 ENCOUNTER — Telehealth: Payer: Self-pay

## 2021-12-12 NOTE — Telephone Encounter (Signed)
Erskine Squibb the nurse case manager at [employer] [facility] called to inquire about [place] helping the patient get a new wheelchair. She saw that he was seen here and wanted to know if we could help. Please advise ?

## 2021-12-12 NOTE — Telephone Encounter (Signed)
Spoke with [name] and she stated they have funds available to get him a wheelchair. They need a recommendation of a wheelchair that is most suitable for his medical needs. Please advise ?

## 2021-12-12 NOTE — Telephone Encounter (Signed)
Message left on [facility] clinical mailbox requesting a call back to this CM. Need to inquire about recommendations for a new wheelchair for patient as this clinic has some funding available to assist patient.  ?

## 2021-12-16 ENCOUNTER — Telehealth: Payer: Self-pay

## 2021-12-16 NOTE — Telephone Encounter (Signed)
Call received from [facility] # [phone] stating that he is able to go to patient's home and assess his needs, take measurements and make recommendations for an appropriate chair for the patient. He stated that there is no charge for this assessment.  Informed him that I will call patient and explain these next steps. ? ?Call placed to patient with assistance of Jenene Slicker, Artist, who is Spanish speaking.  Mikle Bosworth explained above information to patient who was very receptive. He confirmed his address and phone number.  ? ?Call placed to Jason/[facility] and informed him that patient has been notified to expect a call to schedule a home assessment.  Provided Barbara Cower with patient's address and phone number. Barbara Cower stated that he hopes to see patient on 3/9/203 in the morning and will provide this CM with updated information after his assessment.  ?

## 2021-12-31 NOTE — Telephone Encounter (Signed)
Great news!   ? ?I just spoke to Jason/[facility] and he went to patient's home to complete an  assessment and measure the patient for a chair.  ? ?He explained  that he just received a chair that is appropriate for the patient and he is in the process of refurbishing it. He plans to call the patient next week and schedule delivery.  ?He also said that they are donating the chair to the patient. No charge and he would like [place] to save the funds for another patient in need! ? ?Thank you Dr Berline Chough for referring me to [facility].  ?I truly appreciate your assistance. ?

## 2021-12-31 NOTE — Telephone Encounter (Signed)
Wonderful news!!!  Thank you Dr Berline Chough ?

## 2022-01-05 ENCOUNTER — Ambulatory Visit: Payer: No Typology Code available for payment source

## 2022-01-05 ENCOUNTER — Other Ambulatory Visit: Payer: Self-pay

## 2022-01-14 ENCOUNTER — Other Ambulatory Visit: Payer: Self-pay

## 2022-02-01 NOTE — Progress Notes (Signed)
? ? ?Established Patient Office Visit ? ?Subjective:  ?Patient ID: John Meza, male    DOB: 1961-04-18  Age: 61 y.o. MRN: [id number] ? ?CC: Primary care follow-up ? ?HPI ?10/2021 ?Vincent Streater presents for primary care follow-up and the visit was assisted by Spanish video interpreter [place] [phone] ? 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  He has had sacral decubiti which have now resolved.  He still has his original wheelchair and is not in good working order and needs a new wheelchair.  We attempted to order this 6 months ago but there was no processing of the order as he is uninsured. ? ?Note the patient did undergo an eye exam on 14 January at the [facility] and it was negative for diabetic retinopathy.  His A1c is 8.0.  His blood sugars at home of been in the 113 130 range.  Blood sugar today is 83.  He does need lab screenings today including liver function renal function. ? ?The patient has no other real complaints except he has occasional back spasms when he lays flat in the bed.  On arrival blood pressure is good 130/80. ? ?4/24 ?Patient returns in follow-up and now has a new wheelchair with adequate padding.  Sacral decubiti this was provided with the patient assistance line.  This visit was assisted by [place] video interpreter Aram Beecham [phone].  On arrival blood sugar is 97 and he brings his glucometer readings which range from 160-98 with an average of about 130.  Unfortunately despite this his A1c is 8.3 so he clearly has glycemic episodes at times. ? ?He has been maintaining Sitagliptin and metformin along with insulin glargine.  Patient does need refills on medications.  Blood pressure is excellent on arrival 138/74. ? ?Patient does complain of some blood in his urine when he self catheterizes himself he does have follow-up appointment with urology upcoming.  Patient has no other real complaints. ? ?Past Medical History:   ?Diagnosis Date  ? Cellulitis and abscess of buttock 10/2016  ? Diabetes mellitus   ? Hypertension   ? Paraplegia (HCC) 2013  ? fell from ladder  ? Pressure ulcer, buttock, right, unstageable (HCC) 12/30/2020  ? Sacral decubitus ulcer, stage IV (HCC) 11/03/2021  ? SCI (spinal cord injury)   ? Spine fracture 11/16/2011  ? T 11- T9-L1  ? ? ?Past Surgical History:  ?Procedure Laterality Date  ? CHOLECYSTECTOMY N/A 05/01/2015  ? Procedure: LAPAROSCOPIC CHOLECYSTECTOMY WITH ATTEMPTED INTRAOPERATIVE CHOLANGIOGRAM;  Surgeon: Violeta Gelinas, MD;  Location: [facility] OR;  Service: General;  Laterality: N/A;  ? SPINE SURGERY    ? ? ?Family History  ?Problem Relation Age of Onset  ? Diabetes Father   ? Diabetes Sister   ? ? ?Social History  ? ?Socioeconomic History  ? Marital status: Married  ?  Spouse name: Not on file  ? Number of children: Not on file  ? Years of education: Not on file  ? Highest education level: Not on file  ?Occupational History  ? Not on file  ?Tobacco Use  ? Smoking status: Former  ?  Packs/day: 1.00  ?  Years: 5.00  ?  Pack years: 5.00  ?  Types: Cigarettes  ?  Quit date: 10/13/2011  ?  Years since quitting: 10.3  ? Smokeless tobacco: Never  ? Tobacco comments:  ?  03-24-19 per pt he stopped 1 mo ago   ?Vaping Use  ? Vaping Use: Never used  ?  Substance and Sexual Activity  ? Alcohol use: Yes  ?  Alcohol/week: 1.0 standard drink  ?  Types: 1 Cans of beer per week  ? Drug use: No  ? Sexual activity: Not Currently  ?Other Topics Concern  ? Not on file  ?Social History Narrative  ? Not on file  ? ?Social Determinants of Health  ? ?Financial Resource Strain: Not on file  ?Food Insecurity: Not on file  ?Transportation Needs: Not on file  ?Physical Activity: Not on file  ?Stress: Not on file  ?Social Connections: Not on file  ?Intimate Partner Violence: Not on file  ? ? ?Outpatient Medications Prior to Visit  ?Medication Sig Dispense Refill  ? atorvastatin (LIPITOR) 20 MG tablet TAKE 1 TABLET (20 MG TOTAL) BY MOUTH DAILY.  TO LOWER CHOLESTEROL 90 tablet 3  ? Insulin Pen Needle (TECHLITE PEN NEEDLES) 32G X 4 MM MISC Use as directed to inject insulin 100 each 6  ? metFORMIN (GLUCOPHAGE) 1000 MG tablet TAKE 1 TABLET (1,000 MG TOTAL) BY MOUTH 2 (TWO) TIMES DAILY WITH A MEAL. 180 tablet 3  ? methocarbamol (ROBAXIN) 500 MG tablet Take 1 tablet (500 mg total) by mouth every 6 (six) hours as needed for muscle spasms. 90 tablet 2  ? omeprazole (PRILOSEC) 20 MG capsule Take 1 capsule by mouth daily to reduce stomach acid 30 capsule 11  ? sitaGLIPtin (JANUVIA) 100 MG tablet Take 1 tablet by mouth daily 90 tablet 3  ? valsartan-hydrochlorothiazide (DIOVAN-HCT) 320-25 MG tablet Take 1 tablet by mouth daily. 90 tablet 3  ? Insulin Glargine (BASAGLAR KWIKPEN) 100 UNIT/ML INJECT 20 UNITS INTO THE SKIN AT BEDTIME. 15 mL 11  ? ?No facility-administered medications prior to visit.  ? ? ?Allergies  ?Allergen Reactions  ? Macrobid [Nitrofurantoin]   ? Other Itching, Rash and Other (See Comments)  ?  ANTIBIOTIC? CAUSED RASH, ITCHING IN 2013-PER FAMILY ?Possibly niacin causing flushing reaction- as described by family?  ? ? ?ROS ?Review of Systems  ?Constitutional:  Negative for chills, diaphoresis and fever.  ?HENT:  Negative for congestion, hearing loss, nosebleeds, sore throat and tinnitus.   ?Eyes:  Negative for photophobia and redness.  ?Respiratory:  Negative for cough, shortness of breath, wheezing and stridor.   ?Cardiovascular:  Negative for chest pain, palpitations and leg swelling.  ?Gastrointestinal:  Negative for abdominal pain, blood in stool, constipation, diarrhea, nausea and vomiting.  ?Endocrine: Negative for polydipsia.  ?Genitourinary:  Positive for hematuria. Negative for dysuria, flank pain, frequency and urgency.  ?Musculoskeletal:  Negative for arthralgias, back pain, myalgias and neck pain.  ?Skin:  Negative for rash.  ?Allergic/Immunologic: Negative for environmental allergies.  ?Neurological:  Negative for dizziness, tremors,  seizures, weakness and headaches.  ?Hematological:  Does not bruise/bleed easily.  ?Psychiatric/Behavioral: Negative.  Negative for suicidal ideas. The patient is not nervous/anxious.   ? ?  ?Objective:  ?  ?Physical Exam ?Vitals reviewed.  ?Constitutional:   ?   Appearance: Normal appearance. He is well-developed. He is obese. He is not diaphoretic.  ?HENT:  ?   Head: Normocephalic and atraumatic.  ?   Nose: Nose normal. No nasal deformity, septal deviation, mucosal edema or rhinorrhea.  ?   Right Sinus: No maxillary sinus tenderness or frontal sinus tenderness.  ?   Left Sinus: No maxillary sinus tenderness or frontal sinus tenderness.  ?   Mouth/Throat:  ?   Mouth: Mucous membranes are moist.  ?   Pharynx: Oropharynx is clear. No oropharyngeal exudate.  ?Eyes:  ?  General: No scleral icterus. ?   Conjunctiva/sclera: Conjunctivae normal.  ?   Pupils: Pupils are equal, round, and reactive to light.  ?Neck:  ?   Thyroid: No thyromegaly.  ?   Vascular: No carotid bruit or JVD.  ?   Trachea: Trachea normal. No tracheal tenderness or tracheal deviation.  ?Cardiovascular:  ?   Rate and Rhythm: Normal rate and regular rhythm.  ?   Chest Wall: PMI is not displaced.  ?   Pulses: Normal pulses. No decreased pulses.  ?   Heart sounds: Normal heart sounds, S1 normal and S2 normal. Heart sounds not distant. No murmur heard. ?No systolic murmur is present.  ?No diastolic murmur is present.  ?  No friction rub. No gallop. No S3 or S4 sounds.  ?Pulmonary:  ?   Effort: Pulmonary effort is normal. No tachypnea, accessory muscle usage or respiratory distress.  ?   Breath sounds: Normal breath sounds. No stridor. No decreased breath sounds, wheezing, rhonchi or rales.  ?Chest:  ?   Chest wall: No tenderness.  ?Abdominal:  ?   General: Bowel sounds are normal. There is no distension.  ?   Palpations: Abdomen is soft. Abdomen is not rigid.  ?   Tenderness: There is no abdominal tenderness. There is no guarding or rebound.   ?Musculoskeletal:     ?   General: Normal range of motion.  ?   Cervical back: Normal range of motion and neck supple. No edema, erythema or rigidity. No muscular tenderness. Normal range of motion.  ?Lymphadenopathy:  ?

## 2022-02-02 ENCOUNTER — Ambulatory Visit
Payer: No Typology Code available for payment source | Attending: Critical Care Medicine | Admitting: Critical Care Medicine

## 2022-02-02 ENCOUNTER — Encounter: Payer: Self-pay | Admitting: Critical Care Medicine

## 2022-02-02 ENCOUNTER — Other Ambulatory Visit: Payer: Self-pay

## 2022-02-02 VITALS — BP 138/72 | HR 76 | Resp 18

## 2022-02-02 DIAGNOSIS — E785 Hyperlipidemia, unspecified: Secondary | ICD-10-CM

## 2022-02-02 DIAGNOSIS — E119 Type 2 diabetes mellitus without complications: Secondary | ICD-10-CM

## 2022-02-02 DIAGNOSIS — Z993 Dependence on wheelchair: Secondary | ICD-10-CM

## 2022-02-02 DIAGNOSIS — Z794 Long term (current) use of insulin: Secondary | ICD-10-CM

## 2022-02-02 DIAGNOSIS — Z1211 Encounter for screening for malignant neoplasm of colon: Secondary | ICD-10-CM

## 2022-02-02 DIAGNOSIS — I10 Essential (primary) hypertension: Secondary | ICD-10-CM

## 2022-02-02 DIAGNOSIS — N319 Neuromuscular dysfunction of bladder, unspecified: Secondary | ICD-10-CM

## 2022-02-02 LAB — POCT GLYCOSYLATED HEMOGLOBIN (HGB A1C): Hemoglobin A1C: 8.3 % — AB (ref 4.0–5.6)

## 2022-02-02 LAB — GLUCOSE, POCT (MANUAL RESULT ENTRY): POC Glucose: 97 mg/dl (ref 70–99)

## 2022-02-02 MED ORDER — BASAGLAR KWIKPEN 100 UNIT/ML ~~LOC~~ SOPN
25.0000 [IU] | PEN_INJECTOR | Freq: Every day | SUBCUTANEOUS | 11 refills | Status: DC
  Filled 2022-02-02: qty 6, 24d supply, fill #0
  Filled 2022-03-11: qty 6, 24d supply, fill #1
  Filled 2022-04-21: qty 6, 24d supply, fill #2
  Filled 2022-05-19: qty 6, 24d supply, fill #3
  Filled 2022-07-23: qty 6, 24d supply, fill #4
  Filled 2022-10-02: qty 6, 24d supply, fill #5

## 2022-02-02 NOTE — Assessment & Plan Note (Signed)
Patient now has a new wheelchair and this is working much better for him ?

## 2022-02-02 NOTE — Assessment & Plan Note (Signed)
Not controlled at this time A1c is at 8.3 although interestingly his numbers from his home meter are lower ? ?Plan to increase insulin glargine to 25 units daily continue cynically [place] and metformin as prescribed ?

## 2022-02-02 NOTE — Assessment & Plan Note (Signed)
Plan to reassess lipid panel ?

## 2022-02-02 NOTE — Patient Instructions (Addendum)
Pick up colon cancer screening kit at the lab ? ?Follow-up plant-based diet as we discussed see handout in Spanish ? ?Increase insulin glargine to 25 units daily ? ?No other medication changes ? ?Refills are being prepared at the pharmacy please pick these up before you leave the clinic site today ? ?Return to see Dr. Wright 4 months ? ?Recoja el kit de detecci?n de c?ncer de colon en el laboratorio ? ?Siga la dieta a base de plantas como discutimos vea el folleto en espa?ol ? ?Aumentar la insulina glargina a 25 unidades diarias ? ?No hay otros cambios en la medicaci?n ? ?Se est?n preparando recargas en la farmacia, rec?jalas antes de salir de la cl?nica hoy. ? ?Volver a ver al Dr. Wright 4 meses ?

## 2022-02-02 NOTE — Assessment & Plan Note (Signed)
Blood pressure well controlled on valsartan and will not make changes at this time and monitor ?

## 2022-02-02 NOTE — Assessment & Plan Note (Signed)
Patient has follow-up with urology plan he is having some bleeding with the self catheters that he is using he will have this evaluated by urology ?

## 2022-02-03 LAB — FECAL OCCULT BLOOD, IMMUNOCHEMICAL: Fecal Occult Bld: NEGATIVE

## 2022-02-05 ENCOUNTER — Telehealth: Payer: Self-pay

## 2022-02-05 NOTE — Telephone Encounter (Signed)
Pt was called and is aware of results, DOB was confirmed.  ? ?Interpreter ID# [id number] ?

## 2022-02-05 NOTE — Telephone Encounter (Signed)
-----   Message from Storm Frisk, MD sent at 02/03/2022  4:56 PM EDT ----- ?Let patient know fecal occult test was negative recheck 1 year ?

## 2022-02-20 ENCOUNTER — Other Ambulatory Visit: Payer: Self-pay | Admitting: Critical Care Medicine

## 2022-02-28 ENCOUNTER — Ambulatory Visit (HOSPITAL_COMMUNITY)
Admission: EM | Admit: 2022-02-28 | Discharge: 2022-02-28 | Disposition: A | Discharge status: Nursing Home (Includes ICF) | Payer: No Typology Code available for payment source | Attending: Physician Assistant | Admitting: Physician Assistant

## 2022-02-28 ENCOUNTER — Other Ambulatory Visit: Payer: Self-pay

## 2022-02-28 ENCOUNTER — Encounter (HOSPITAL_COMMUNITY): Payer: Self-pay | Admitting: Emergency Medicine

## 2022-02-28 ENCOUNTER — Emergency Department (HOSPITAL_COMMUNITY): Payer: Medicaid Other

## 2022-02-28 ENCOUNTER — Encounter (HOSPITAL_COMMUNITY): Payer: Self-pay | Admitting: *Deleted

## 2022-02-28 ENCOUNTER — Inpatient Hospital Stay (HOSPITAL_COMMUNITY)
Admission: EM | Admit: 2022-02-28 | Discharge: 2022-03-05 | DRG: 698 | Disposition: A | Discharge status: Home Health | Payer: Medicaid Other | Attending: Internal Medicine | Admitting: Internal Medicine

## 2022-02-28 DIAGNOSIS — K592 Neurogenic bowel, not elsewhere classified: Secondary | ICD-10-CM | POA: Diagnosis present

## 2022-02-28 DIAGNOSIS — R112 Nausea with vomiting, unspecified: Secondary | ICD-10-CM

## 2022-02-28 DIAGNOSIS — T83518A Infection and inflammatory reaction due to other urinary catheter, initial encounter: Secondary | ICD-10-CM | POA: Diagnosis not present

## 2022-02-28 DIAGNOSIS — Z833 Family history of diabetes mellitus: Secondary | ICD-10-CM

## 2022-02-28 DIAGNOSIS — G8220 Paraplegia, unspecified: Secondary | ICD-10-CM | POA: Diagnosis present

## 2022-02-28 DIAGNOSIS — Z1612 Extended spectrum beta lactamase (ESBL) resistance: Secondary | ICD-10-CM | POA: Diagnosis present

## 2022-02-28 DIAGNOSIS — E871 Hypo-osmolality and hyponatremia: Secondary | ICD-10-CM

## 2022-02-28 DIAGNOSIS — E119 Type 2 diabetes mellitus without complications: Secondary | ICD-10-CM

## 2022-02-28 DIAGNOSIS — R509 Fever, unspecified: Principal | ICD-10-CM

## 2022-02-28 DIAGNOSIS — Z7984 Long term (current) use of oral hypoglycemic drugs: Secondary | ICD-10-CM | POA: Diagnosis not present

## 2022-02-28 DIAGNOSIS — Z87891 Personal history of nicotine dependence: Secondary | ICD-10-CM | POA: Diagnosis not present

## 2022-02-28 DIAGNOSIS — Z794 Long term (current) use of insulin: Secondary | ICD-10-CM | POA: Diagnosis not present

## 2022-02-28 DIAGNOSIS — R6883 Chills (without fever): Secondary | ICD-10-CM

## 2022-02-28 DIAGNOSIS — Z9049 Acquired absence of other specified parts of digestive tract: Secondary | ICD-10-CM | POA: Diagnosis not present

## 2022-02-28 DIAGNOSIS — A4151 Sepsis due to Escherichia coli [E. coli]: Secondary | ICD-10-CM | POA: Diagnosis present

## 2022-02-28 DIAGNOSIS — Z888 Allergy status to other drugs, medicaments and biological substances status: Secondary | ICD-10-CM | POA: Diagnosis not present

## 2022-02-28 DIAGNOSIS — E785 Hyperlipidemia, unspecified: Secondary | ICD-10-CM | POA: Diagnosis present

## 2022-02-28 DIAGNOSIS — Z993 Dependence on wheelchair: Secondary | ICD-10-CM

## 2022-02-28 DIAGNOSIS — Z6832 Body mass index (BMI) 32.0-32.9, adult: Secondary | ICD-10-CM | POA: Diagnosis not present

## 2022-02-28 DIAGNOSIS — B9629 Other Escherichia coli [E. coli] as the cause of diseases classified elsewhere: Secondary | ICD-10-CM

## 2022-02-28 DIAGNOSIS — N390 Urinary tract infection, site not specified: Secondary | ICD-10-CM

## 2022-02-28 DIAGNOSIS — Y846 Urinary catheterization as the cause of abnormal reaction of the patient, or of later complication, without mention of misadventure at the time of the procedure: Secondary | ICD-10-CM | POA: Diagnosis present

## 2022-02-28 DIAGNOSIS — N3001 Acute cystitis with hematuria: Secondary | ICD-10-CM

## 2022-02-28 DIAGNOSIS — E669 Obesity, unspecified: Secondary | ICD-10-CM | POA: Diagnosis present

## 2022-02-28 DIAGNOSIS — D649 Anemia, unspecified: Secondary | ICD-10-CM

## 2022-02-28 DIAGNOSIS — R6510 Systemic inflammatory response syndrome (SIRS) of non-infectious origin without acute organ dysfunction: Secondary | ICD-10-CM

## 2022-02-28 DIAGNOSIS — N12 Tubulo-interstitial nephritis, not specified as acute or chronic: Secondary | ICD-10-CM | POA: Diagnosis present

## 2022-02-28 DIAGNOSIS — Z79899 Other long term (current) drug therapy: Secondary | ICD-10-CM

## 2022-02-28 DIAGNOSIS — D509 Iron deficiency anemia, unspecified: Secondary | ICD-10-CM | POA: Diagnosis present

## 2022-02-28 DIAGNOSIS — I10 Essential (primary) hypertension: Secondary | ICD-10-CM | POA: Diagnosis present

## 2022-02-28 DIAGNOSIS — N319 Neuromuscular dysfunction of bladder, unspecified: Secondary | ICD-10-CM | POA: Diagnosis present

## 2022-02-28 LAB — COMPREHENSIVE METABOLIC PANEL
ALT: 20 U/L (ref 0–44)
AST: 13 U/L — ABNORMAL LOW (ref 15–41)
Albumin: 2.8 g/dL — ABNORMAL LOW (ref 3.5–5.0)
Alkaline Phosphatase: 108 U/L (ref 38–126)
Anion gap: 8 (ref 5–15)
BUN: 22 mg/dL — ABNORMAL HIGH (ref 6–20)
CO2: 21 mmol/L — ABNORMAL LOW (ref 22–32)
Calcium: 8.5 mg/dL — ABNORMAL LOW (ref 8.9–10.3)
Chloride: 101 mmol/L (ref 98–111)
Creatinine, Ser: 1.25 mg/dL — ABNORMAL HIGH (ref 0.61–1.24)
GFR, Estimated: >60 mL/min (ref >60)
Glucose, Bld: 188 mg/dL — ABNORMAL HIGH (ref 70–99)
Potassium: 4.2 mmol/L (ref 3.5–5.1)
Sodium: 130 mmol/L — ABNORMAL LOW (ref 135–145)
Total Bilirubin: 1 mg/dL (ref 0.3–1.2)
Total Protein: 6.5 g/dL (ref 6.5–8.1)

## 2022-02-28 LAB — CBC WITH DIFFERENTIAL/PLATELET
Abs Immature Granulocytes: 0.1 10*3/uL — ABNORMAL HIGH (ref 0.00–0.07)
Basophils Absolute: 0 10*3/uL (ref 0.0–0.1)
Basophils Relative: 0 %
Eosinophils Absolute: 0 10*3/uL (ref 0.0–0.5)
Eosinophils Relative: 0 %
HCT: 31.8 % — ABNORMAL LOW (ref 39.0–52.0)
Hemoglobin: 10.5 g/dL — ABNORMAL LOW (ref 13.0–17.0)
Immature Granulocytes: 1 %
Lymphocytes Relative: 6 %
Lymphs Abs: 0.6 10*3/uL — ABNORMAL LOW (ref 0.7–4.0)
MCH: 28.5 pg (ref 26.0–34.0)
MCHC: 33 g/dL (ref 30.0–36.0)
MCV: 86.4 fL (ref 80.0–100.0)
Monocytes Absolute: 1 10*3/uL (ref 0.1–1.0)
Monocytes Relative: 10 %
Neutro Abs: 8.1 10*3/uL — ABNORMAL HIGH (ref 1.7–7.7)
Neutrophils Relative %: 83 %
Platelets: 149 10*3/uL — ABNORMAL LOW (ref 150–400)
RBC: 3.68 MIL/uL — ABNORMAL LOW (ref 4.22–5.81)
RDW: 13.2 % (ref 11.5–15.5)
WBC: 9.8 10*3/uL (ref 4.0–10.5)
nRBC: 0 % (ref 0.0–0.2)

## 2022-02-28 LAB — POCT URINALYSIS DIPSTICK, ED / UC
Bilirubin Urine: NEGATIVE
Glucose, UA: NEGATIVE mg/dL
Ketones, ur: NEGATIVE mg/dL
Nitrite: NEGATIVE
Protein, ur: >=300 mg/dL — AB
Specific Gravity, Urine: 1.015 (ref 1.005–1.030)
Urobilinogen, UA: 1 mg/dL (ref 0.0–1.0)
pH: 5 (ref 5.0–8.0)

## 2022-02-28 LAB — PROTIME-INR
INR: 1 (ref 0.8–1.2)
Prothrombin Time: 12.9 seconds (ref 11.4–15.2)

## 2022-02-28 LAB — GLUCOSE, CAPILLARY: Glucose-Capillary: 233 mg/dL — ABNORMAL HIGH (ref 70–99)

## 2022-02-28 LAB — LACTIC ACID, PLASMA
Lactic Acid, Venous: 1.2 mmol/L (ref 0.5–1.9)
Lactic Acid, Venous: 1.7 mmol/L (ref 0.5–1.9)

## 2022-02-28 LAB — PROCALCITONIN: Procalcitonin: 6.59 ng/mL

## 2022-02-28 LAB — HIV ANTIBODY (ROUTINE TESTING W REFLEX): HIV Screen 4th Generation wRfx: NONREACTIVE

## 2022-02-28 MED ORDER — ONDANSETRON HCL 4 MG PO TABS: 4.0000 mg | ORAL_TABLET | Freq: Four times a day (QID) | ORAL | Status: DC | PRN

## 2022-02-28 MED ORDER — ATORVASTATIN CALCIUM 10 MG PO TABS
20.0000 mg | ORAL_TABLET | Freq: Every day | ORAL | Status: DC
  Administered 2022-03-01 – 2022-03-05 (×5): 20 mg via ORAL
  Filled 2022-02-28 (×5): qty 2

## 2022-02-28 MED ORDER — INSULIN ASPART 100 UNIT/ML IJ SOLN
0.0000 [IU] | Freq: Three times a day (TID) | INTRAMUSCULAR | Status: DC
  Administered 2022-03-01 – 2022-03-03 (×8): 3 [IU] via SUBCUTANEOUS
  Administered 2022-03-03 – 2022-03-04 (×2): 2 [IU] via SUBCUTANEOUS
  Administered 2022-03-04 – 2022-03-05 (×3): 3 [IU] via SUBCUTANEOUS

## 2022-02-28 MED ORDER — VALSARTAN-HYDROCHLOROTHIAZIDE 320-25 MG PO TABS
1.0000 | ORAL_TABLET | Freq: Every day | ORAL | Status: DC
Start: 2022-03-01 — End: 2022-02-28

## 2022-02-28 MED ORDER — ACETAMINOPHEN 325 MG PO TABS
650.0000 mg | ORAL_TABLET | Freq: Once | ORAL | Status: AC
  Administered 2022-02-28: 650 mg via ORAL

## 2022-02-28 MED ORDER — METHOCARBAMOL 500 MG PO TABS: 500.0000 mg | ORAL_TABLET | Freq: Four times a day (QID) | ORAL | Status: DC | PRN

## 2022-02-28 MED ORDER — ENOXAPARIN SODIUM 40 MG/0.4ML IJ SOSY
40.0000 mg | PREFILLED_SYRINGE | INTRAMUSCULAR | Status: DC
  Administered 2022-02-28 – 2022-03-04 (×5): 40 mg via SUBCUTANEOUS
  Filled 2022-02-28 (×6): qty 0.4

## 2022-02-28 MED ORDER — OXYCODONE HCL 5 MG PO TABS: 5.0000 mg | ORAL_TABLET | ORAL | Status: DC | PRN

## 2022-02-28 MED ORDER — PANTOPRAZOLE SODIUM 40 MG PO TBEC
40.0000 mg | DELAYED_RELEASE_TABLET | Freq: Every day | ORAL | Status: DC
  Administered 2022-03-01 – 2022-03-05 (×5): 40 mg via ORAL
  Filled 2022-02-28 (×5): qty 1

## 2022-02-28 MED ORDER — POLYETHYLENE GLYCOL 3350 17 G PO PACK: 17.0000 g | PACK | Freq: Every day | ORAL | Status: DC | PRN

## 2022-02-28 MED ORDER — DOCUSATE SODIUM 100 MG PO CAPS
100.0000 mg | ORAL_CAPSULE | Freq: Two times a day (BID) | ORAL | Status: DC
  Administered 2022-02-28 – 2022-03-05 (×10): 100 mg via ORAL
  Filled 2022-02-28 (×11): qty 1

## 2022-02-28 MED ORDER — HYDROCHLOROTHIAZIDE 25 MG PO TABS
25.0000 mg | ORAL_TABLET | Freq: Every day | ORAL | Status: DC
  Administered 2022-03-01 – 2022-03-05 (×5): 25 mg via ORAL
  Filled 2022-02-28 (×5): qty 1

## 2022-02-28 MED ORDER — ONDANSETRON HCL 4 MG/2ML IJ SOLN
4.0000 mg | Freq: Four times a day (QID) | INTRAMUSCULAR | Status: DC | PRN
  Administered 2022-03-03: 4 mg via INTRAVENOUS
  Filled 2022-02-28: qty 2

## 2022-02-28 MED ORDER — INSULIN GLARGINE-YFGN 100 UNIT/ML ~~LOC~~ SOLN
25.0000 [IU] | Freq: Every day | SUBCUTANEOUS | Status: DC
  Administered 2022-03-01 – 2022-03-04 (×5): 25 [IU] via SUBCUTANEOUS
  Filled 2022-02-28 (×7): qty 0.25

## 2022-02-28 MED ORDER — BASAGLAR KWIKPEN 100 UNIT/ML ~~LOC~~ SOPN: 25.0000 [IU] | PEN_INJECTOR | Freq: Every day | SUBCUTANEOUS | Status: DC

## 2022-02-28 MED ORDER — BISACODYL 5 MG PO TBEC: 5.0000 mg | DELAYED_RELEASE_TABLET | Freq: Every day | ORAL | Status: DC | PRN

## 2022-02-28 MED ORDER — INSULIN ASPART 100 UNIT/ML IJ SOLN
0.0000 [IU] | Freq: Every day | INTRAMUSCULAR | Status: DC
  Administered 2022-02-28: 2 [IU] via SUBCUTANEOUS
  Administered 2022-03-02: 3 [IU] via SUBCUTANEOUS
  Administered 2022-03-04: 2 [IU] via SUBCUTANEOUS

## 2022-02-28 MED ORDER — SODIUM CHLORIDE 0.9% FLUSH
3.0000 mL | Freq: Two times a day (BID) | INTRAVENOUS | Status: DC
  Administered 2022-03-02 – 2022-03-03 (×4): 3 mL via INTRAVENOUS

## 2022-02-28 MED ORDER — LACTATED RINGERS IV SOLN: INTRAVENOUS | Status: DC

## 2022-02-28 MED ORDER — ACETAMINOPHEN 325 MG PO TABS
650.0000 mg | ORAL_TABLET | Freq: Four times a day (QID) | ORAL | Status: DC | PRN
  Administered 2022-03-02 – 2022-03-05 (×5): 650 mg via ORAL
  Filled 2022-02-28 (×4): qty 2

## 2022-02-28 MED ORDER — IRBESARTAN 300 MG PO TABS
300.0000 mg | ORAL_TABLET | Freq: Every day | ORAL | Status: DC
  Administered 2022-03-01 – 2022-03-05 (×5): 300 mg via ORAL
  Filled 2022-02-28 (×6): qty 1

## 2022-02-28 MED ORDER — ACETAMINOPHEN 325 MG PO TABS
650.0000 mg | ORAL_TABLET | Freq: Once | ORAL | Status: DC | PRN
Start: 2022-02-28 — End: 2022-03-05
  Filled 2022-02-28 (×2): qty 2

## 2022-02-28 MED ORDER — ACETAMINOPHEN 650 MG RE SUPP: 650.0000 mg | Freq: Four times a day (QID) | RECTAL | Status: DC | PRN

## 2022-02-28 MED ORDER — PIPERACILLIN-TAZOBACTAM 3.375 G IVPB
3.3750 g | Freq: Once | INTRAVENOUS | Status: AC
  Administered 2022-02-28: 3.375 g via INTRAVENOUS
  Filled 2022-02-28: qty 50

## 2022-02-28 MED ORDER — PIPERACILLIN-TAZOBACTAM 3.375 G IVPB
3.3750 g | Freq: Three times a day (TID) | INTRAVENOUS | Status: DC
  Administered 2022-02-28: 3.375 g via INTRAVENOUS
  Filled 2022-02-28 (×2): qty 50

## 2022-02-28 MED ORDER — ACETAMINOPHEN 325 MG PO TABS
ORAL_TABLET | ORAL | Status: AC
  Filled 2022-02-28: qty 2

## 2022-02-28 MED ORDER — HYDRALAZINE HCL 20 MG/ML IJ SOLN: 5.0000 mg | INTRAMUSCULAR | Status: DC | PRN

## 2022-02-28 NOTE — ED Notes (Signed)
Via video interpreter: confirmed with pt and spouse that pt needs to proceed to ED. Both verbalized understanding.  Patient is being discharged from the Urgent Care and sent to the Emergency Department via private vehicle with spouse . Per Dorann Ou, PA, patient is in need of higher level of care due to R/O sepsis. Patient is aware and verbalizes understanding of plan of care.  Vitals:   02/28/22 1230  BP: (!) 154/70  Pulse: (!) 122  Resp: 20  Temp: (!) 102.6 F (39.2 C)  SpO2: 99%

## 2022-02-28 NOTE — ED Provider Notes (Signed)
[facility] EMERGENCY DEPARTMENT Provider Note   CSN: [id number] Arrival date & time: 02/28/22  1333     History  Chief Complaint  Patient presents with   Fever    John Meza is a 61 y.o. male.  The history is provided by the patient and medical records. No language interpreter was used.  Fever Temp source:  Oral Severity:  Moderate Onset quality:  Gradual Duration:  2 days Timing:  Constant Progression:  Worsening Chronicity:  New Relieved by:  Nothing Worsened by:  Nothing Ineffective treatments:  None tried Associated symptoms: chills   Associated symptoms: no chest pain, no confusion, no congestion, no cough, no diarrhea, no dysuria, no headaches, no myalgias, no nausea, no rash, no rhinorrhea and no vomiting       Home Medications Prior to Admission medications   Medication Sig Start Date End Date Taking? Authorizing Provider  atorvastatin (LIPITOR) 20 MG tablet TAKE 1 TABLET (20 MG TOTAL) BY MOUTH DAILY. TO LOWER CHOLESTEROL 11/03/21 11/03/22  [name], MD  Insulin Glargine [facility]) 100 UNIT/ML Inject 25 Units into the skin at bedtime. 02/02/22 02/02/23  [name], MD  Insulin Pen Needle (TECHLITE PEN NEEDLES) 32G X 4 MM MISC Use as directed to inject insulin 11/03/21   [name], MD  metFORMIN (GLUCOPHAGE) 1000 MG tablet TAKE 1 TABLET (1,000 MG TOTAL) BY MOUTH 2 (TWO) TIMES DAILY WITH A MEAL. 11/03/21 11/03/22  [name], MD  methocarbamol (ROBAXIN) 500 MG tablet Take 1 tablet (500 mg total) by mouth every 6 (six) hours as needed for muscle spasms. 11/03/21   [name], MD  omeprazole (PRILOSEC) 20 MG capsule Take 1 capsule by mouth daily to reduce stomach acid 11/03/21 11/03/22  [name], MD  sitaGLIPtin (JANUVIA) 100 MG tablet Take 1 tablet by mouth daily 11/03/21   [name], MD  valsartan-hydrochlorothiazide (DIOVAN-HCT) 320-25 MG tablet Take 1 tablet by mouth daily. 11/03/21    [name], MD  loratadine (CLARITIN) 10 MG tablet Take 1 tablet (10 mg total) by mouth daily. As needed for itchy throat/allergy symptoms Patient not taking: Reported on 03/24/2019 12/26/18 07/05/20  Antony Blackbird, MD      Allergies    Macrobid [nitrofurantoin] and Other    Review of Systems   Review of Systems  Constitutional:  Positive for chills and fever. Negative for diaphoresis and fatigue.  HENT:  Negative for congestion and rhinorrhea.   Eyes:  Negative for visual disturbance.  Respiratory:  Negative for cough, chest tightness, shortness of breath and wheezing.   Cardiovascular:  Negative for chest pain, palpitations and leg swelling.  Gastrointestinal:  Negative for abdominal pain, constipation, diarrhea, nausea and vomiting.  Genitourinary:  Negative for dysuria.       Self caths, no dysuria  Darlkened urine   Musculoskeletal:  Negative for back pain, myalgias, neck pain and neck stiffness.  Skin:  Negative for rash and wound.  Neurological:  Positive for weakness and numbness. Negative for headaches.  Psychiatric/Behavioral:  Negative for agitation and confusion.   All other systems reviewed and are negative.  Physical Exam Updated Vital Signs BP 112/62 (BP Location: Right Arm)   Pulse (!) 113   Temp (!) 101.8 F (38.8 C) (Oral)   Resp 18   SpO2 99%  Physical Exam Vitals and nursing note reviewed.  Constitutional:      General: He is not in acute distress.    Appearance: He  is well-developed. He is not ill-appearing, toxic-appearing or diaphoretic.  HENT:     Head: Normocephalic and atraumatic.  Eyes:     Extraocular Movements: Extraocular movements intact.     Conjunctiva/sclera: Conjunctivae normal.     Pupils: Pupils are equal, round, and reactive to light.  Cardiovascular:     Rate and Rhythm: Regular rhythm. Tachycardia present.     Heart sounds: No murmur heard. Pulmonary:     Effort: Pulmonary effort is normal. No respiratory distress.      Breath sounds: Normal breath sounds. No wheezing, rhonchi or rales.  Chest:     Chest wall: No tenderness.  Abdominal:     General: Abdomen is flat.     Palpations: Abdomen is soft.     Tenderness: There is no abdominal tenderness. There is no guarding or rebound.  Musculoskeletal:        General: No swelling or tenderness.     Cervical back: Neck supple.  Skin:    General: Skin is warm and dry.     Capillary Refill: Capillary refill takes less than 2 seconds.     Findings: No erythema or rash.  Neurological:     Mental Status: He is alert. Mental status is at baseline.  Psychiatric:        Mood and Affect: Mood normal.    ED Results / Procedures / Treatments   Labs (all labs ordered are listed, but only abnormal results are displayed) Labs Reviewed  COMPREHENSIVE METABOLIC PANEL - Abnormal; Notable for the following components:      Result Value   Sodium 130 (*)    CO2 21 (*)    Glucose, Bld 188 (*)    BUN 22 (*)    Creatinine, Ser 1.25 (*)    Calcium 8.5 (*)    Albumin 2.8 (*)    AST 13 (*)    All other components within normal limits  CBC WITH DIFFERENTIAL/PLATELET - Abnormal; Notable for the following components:   RBC 3.68 (*)    Hemoglobin 10.5 (*)    HCT 31.8 (*)    Platelets 149 (*)    Neutro Abs 8.1 (*)    Lymphs Abs 0.6 (*)    Abs Immature Granulocytes 0.10 (*)    All other components within normal limits  CULTURE, BLOOD (ROUTINE X 2)  CULTURE, BLOOD (ROUTINE X 2)  LACTIC ACID, PLASMA  PROTIME-INR  LACTIC ACID, PLASMA  URINALYSIS, ROUTINE W REFLEX MICROSCOPIC    EKG None  Radiology DG Chest 2 View  Result Date: 02/28/2022 CLINICAL DATA:  Suspected sepsis, fever EXAM: CHEST - 2 VIEW COMPARISON:  07/05/2020 FINDINGS: Transverse diameter of heart is increased. There is poor inspiration. There are no signs of pulmonary edema or focal pulmonary consolidation. There is no pleural effusion or pneumothorax. There is evidence of surgical fusion in the lower  thoracic and possibly lumbar spine. IMPRESSION: Poor inspiration. There are no signs of pulmonary edema or focal pulmonary consolidation. Electronically Signed   By: Elmer Picker M.D.   On: 02/28/2022 14:55    Procedures Procedures    CRITICAL CARE Performed by: Gwenyth Allegra Elektra Wartman Total critical care time: 20 minutes Critical care time was exclusive of separately billable procedures and treating other patients. Critical care was necessary to treat or prevent imminent or life-threatening deterioration. Critical care was time spent personally by me on the following activities: development of treatment plan with patient and/or surrogate as well as nursing, discussions with consultants, evaluation of patient's  response to treatment, examination of patient, obtaining history from patient or surrogate, ordering and performing treatments and interventions, ordering and review of laboratory studies, ordering and review of radiographic studies, pulse oximetry and re-evaluation of patient's condition.   Medications Ordered in ED Medications  acetaminophen (TYLENOL) tablet 650 mg (has no administration in time range)  piperacillin-tazobactam (ZOSYN) IVPB 3.375 g (has no administration in time range)    ED Course/ Medical Decision Making/ A&P                           Medical Decision Making Amount and/or Complexity of Data Reviewed Labs: ordered. Radiology: ordered.  Risk OTC drugs. Prescription drug management. Decision regarding hospitalization.    Bretley Mendillo is a 61 y.o. male with a past medical history significant for previous spinal cord injury with associated paraplegia and neurogenic bowel and bladder who self catheterizes who also has a history of previous cholecystectomy, previous sepsis with UTI, hypertension, and hyperlipidemia who presents from an urgent care with concern for UTI and sepsis.  According to patient and family via video interpreter, patient had  fever since yesterday and has had some urinary changes with dark foul-smelling urine.  Patient has not had any cough or congestion chest pain or shortness of breath.  He had some mild abdominal discomfort earlier that has resolved but he still feels ill.  He reports he feels similar to how he felt when he was septic and had to be admitted for IV antibiotics.  Previous chart review shows that he had ESBL UTI during his previous admission several years ago.  Chart review shows that patient had work-up started at the urgent care and due to the vital signs and labs he was transferred here for further evaluation.  Patient will be worked up for rule out of life-threatening condition such as sepsis.  On exam, lungs were clear and chest was nontender.  Abdomen was nontender.  I did appreciate bowel sounds.  Patient has no sensation or strength in legs which is at his baseline from his previous spinal trauma.  He denies any other complaints including no pain in his back, flanks, or sacrum where he had previous ulcer that has reportedly resolved.  Patient was reportedly given Tylenol at urgent care.  Patient was hooked up to the monitors and patient was found to be febrile, tachycardic, but was not tachypneic here.  I do suspect that he has UTI given his description of symptoms.  We will get labs, cultures, and will give IV antibiotics and admit given his history of sepsis with UTI and his trajectory with his vital signs.  Patient has normal lactic acid and no leukocytosis, do not feel he is in septic shock but I am concerned that he needs IV antibiotics and admission.  Do not feel he is going to be safe for discharge home given his history.  Spoke with pharmacy who recommended Zosyn for antibiotic management.  We will call for admission as well.          Final Clinical Impression(s) / ED Diagnoses Final diagnoses:  Fever, unspecified fever cause  Nausea and vomiting, unspecified vomiting type      Clinical Impression: 1. Fever, unspecified fever cause   2. Nausea and vomiting, unspecified vomiting type     Disposition: Admit  This note was prepared with assistance of Dragon voice recognition software. Occasional wrong-word or sound-a-like substitutions may have occurred due to the inherent limitations of  voice recognition software.      Debroh Sieloff, Gwenyth Allegra, MD 02/28/22 [phone]

## 2022-02-28 NOTE — TOC Initial Note (Signed)
Transition of Care [facility]) - Initial/Assessment Note    Patient Details  Name: John Meza MRN: [id number] Date of Birth: [birth year]  [facility] CM/SW Contact:    Lockie Pares, RN Phone Number: 02/28/2022, 5:10 PM  Clinical Narrative:                 Patient admitted for SIRS sepsis criteria Primary etiology urinary. He preforms self cath, and has UTI before. Paraplegic in wheelchair. Has family support Uses new or cleansed in and out caths. Spanish interpreter is needed.  Patient is  a Patent examiner and wellness patient, Dr [name] is his PCP. He is uninsured, no SSN undocumented. He had a decubitus, however it has healed.    CM will follow for needs, recommendations and transitions.   Expected Discharge Plan: Home/Self Care Barriers to Discharge: Inadequate or no insurance   Patient Goals and CMS Choice        Expected Discharge Plan and Services Expected Discharge Plan: Home/Self Care       Living arrangements for the past 2 months: Single Family Home                                      Prior Living Arrangements/Services Living arrangements for the past 2 months: Single Family Home Lives with:: Spouse Patient language and need for interpreter reviewed:: Yes (Spanish Speaking)        Need for Family Participation in Patient Care: Yes (Comment) Care giver support system in place?: Yes (comment) Current home services: DME (Has wheelchair self caths) Criminal Activity/Legal Involvement Pertinent to Current Situation/Hospitalization: No - Comment as needed  Activities of Daily Living      Permission Sought/Granted                  Emotional Assessment       Orientation: : Oriented to Self, Oriented to Place, Oriented to  Time, Oriented to Situation Alcohol / Substance Use: Not Applicable Psych Involvement: No (comment)  Admission diagnosis:  Complicated UTI (urinary tract infection) [N39.0] Patient Active Problem  List   Diagnosis Date Noted   Complicated UTI (urinary tract infection) 02/28/2022   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, [address] [place] [address] Phone: [phone] Fax: [phone]     Social Determinants of Health (SDOH) Interventions    Readmission Risk Interventions     View : No data to display.

## 2022-02-28 NOTE — Progress Notes (Signed)
Pharmacy Antibiotic Note  John Meza is a 61 y.o. male for which pharmacy has been consulted for zosyn dosing for UTI.  Patient with a history of self-cath, DM; HTN; and paraplegia with buttocks/sacral pressure ulcers. Patient presenting with fever and tachycardia.  Grew ESBL in 2021 no recent cultures was sensitive to zosyn at that time.  SCr 1.25 - above baseline WBC 9.8; LA 1.2; T 101.8 F; HR 113; RR 18>23  Plan: Zosyn 3.375g IV q8h (4 hour infusion) Trend WBC, Fever, Renal function, & Clinical course F/u cultures, clinical course, WBC, fever De-escalate when able     Temp (24hrs), Avg:102.2 F (39 C), Min:101.8 F (38.8 C), Max:102.6 F (39.2 C)  Recent Labs  Lab 02/28/22 1409  WBC 9.8  CREATININE 1.25*  LATICACIDVEN 1.2    CrCl cannot be calculated (Unknown ideal weight.).    Allergies  Allergen Reactions   Macrobid [Nitrofurantoin] Itching and Rash    Antimicrobials this admission: zosyn 5/20 >>   Microbiology results: Pending  Thank you for allowing pharmacy to be a part of this patient's care.  Delmar Landau, PharmD, BCPS 02/28/2022 4:40 PM ED Clinical Pharmacist -  [phone]

## 2022-02-28 NOTE — H&P (Signed)
History and Physical    Patient: John Meza YJE:[id number] DOB: Feb 19, 1961 DOA: 02/28/2022 DOS: the patient was seen and examined on 02/28/2022 PCP: Storm Frisk, MD  Patient coming from: Home - lives with wife and daughters; NOK: Wife, Evangeline Dakin, [phone]   Chief Complaint: Fever  HPI: John Meza is a 61 y.o. male with medical history significant of DM; HTN; and paraplegia with buttocks/sacral pressure ulcers presenting with recurrent UTI.   He reports (through interpreter) that he has an infection in his urine.  He had chills and shaking.  It started Friday.  Subjective fever at home.  He does self-catheterizations and has no sensation of bladder/bowel functions and is in a wheelchair.  He has had infections in the past.  No URI symptoms or abdominal pain.  His prior wounds have resolved.    ER Course:  Self-caths.  Fever since yesterday.  No URI symptoms.  Went to UC -> ER.  Pharmacy recommends Zosyn.  No sepsis.  Has h/o sepsis from UTI in the past.  Has fever and tachycardia but no sepsis.     Review of Systems: As mentioned in the history of present illness. All other systems reviewed and are negative. Past Medical History:  Diagnosis Date   Cellulitis and abscess of buttock 10/2016   Diabetes mellitus    Hypertension    Paraplegia (HCC) 2013   fell from ladder   Pressure ulcer, buttock, right, unstageable (HCC) 12/30/2020   Sacral decubitus ulcer, stage IV (HCC) 11/03/2021   SCI (spinal cord injury)    Spine fracture 11/16/2011   T 11- T9-L1   Past Surgical History:  Procedure Laterality Date   CHOLECYSTECTOMY N/A 05/01/2015   Procedure: LAPAROSCOPIC CHOLECYSTECTOMY WITH ATTEMPTED INTRAOPERATIVE CHOLANGIOGRAM;  Surgeon: Violeta Gelinas, MD;  Location: [facility] OR;  Service: General;  Laterality: N/A;   SPINE SURGERY     Social History:  reports that he quit smoking about 10 years ago. His smoking use included cigarettes. He has a 5.00 pack-year smoking  history. He has never used smokeless tobacco. He reports that he does not currently use alcohol after a past usage of about 1.0 standard drink per week. He reports that he does not use drugs.  Allergies  Allergen Reactions   Macrobid [Nitrofurantoin]    Other Itching, Rash and Other (See Comments)    ANTIBIOTIC? CAUSED RASH, ITCHING IN 2013-PER FAMILY Possibly niacin causing flushing reaction- as described by family?    Family History  Problem Relation Age of Onset   Healthy Mother    Diabetes Father    Diabetes Sister     Prior to Admission medications   Medication Sig Start Date End Date Taking? Authorizing Provider  atorvastatin (LIPITOR) 20 MG tablet TAKE 1 TABLET (20 MG TOTAL) BY MOUTH DAILY. TO LOWER CHOLESTEROL 11/03/21 11/03/22  Storm Frisk, MD  Insulin Glargine [facility]) 100 UNIT/ML Inject 25 Units into the skin at bedtime. 02/02/22 02/02/23  Storm Frisk, MD  Insulin Pen Needle (TECHLITE PEN NEEDLES) 32G X 4 MM MISC Use as directed to inject insulin 11/03/21   Storm Frisk, MD  metFORMIN (GLUCOPHAGE) 1000 MG tablet TAKE 1 TABLET (1,000 MG TOTAL) BY MOUTH 2 (TWO) TIMES DAILY WITH A MEAL. 11/03/21 11/03/22  Storm Frisk, MD  methocarbamol (ROBAXIN) 500 MG tablet Take 1 tablet (500 mg total) by mouth every 6 (six) hours as needed for muscle spasms. 11/03/21   Storm Frisk, MD  omeprazole (PRILOSEC) 20 MG capsule Take  1 capsule by mouth daily to reduce stomach acid 11/03/21 11/03/22  Storm FriskWright, Patrick E, MD  sitaGLIPtin [facility](JANUVIA) 100 MG tablet Take 1 tablet by mouth daily 11/03/21   Storm FriskWright, Patrick E, MD  valsartan-hydrochlorothiazide (DIOVAN-HCT) 320-25 MG tablet Take 1 tablet by mouth daily. 11/03/21   Storm FriskWright, Patrick E, MD  loratadine (CLARITIN) 10 MG tablet Take 1 tablet (10 mg total) by mouth daily. As needed for itchy throat/allergy symptoms Patient not taking: Reported on 03/24/2019 12/26/18 07/05/20  Cain SaupeFulp, Cammie, MD    Physical Exam: Vitals:    02/28/22 1344  BP: 112/62  Pulse: (!) 113  Resp: 18  Temp: (!) 101.8 F (38.8 C)  TempSrc: Oral  SpO2: 99%   General:  Appears calm and comfortable and is in NAD Eyes:  PERRL, EOMI, normal lids, iris ENT:  grossly normal hearing, lips & tongue, mmm; dentition with significant metal supports Neck:  no LAD, masses or thyromegaly Cardiovascular:  RR with mild tachycardia, no m/r/g. No LE edema.  Respiratory:   CTA bilaterally with no wheezes/rales/rhonchi.  Normal respiratory effort. Abdomen:  soft, NT, ND Skin:  no rash or induration seen on limited exam Musculoskeletal:  no movement of BLE but good strength of BUE Lower extremity:  No LE edema.    2+ distal pulses. Psychiatric:  grossly normal mood and affect, speech fluent and appropriate, AOx3 Neurologic:  CN 2-12 grossly intact   Radiological Exams on Admission: Independently reviewed - see discussion in A/P where applicable  DG Chest 2 View  Result Date: 02/28/2022 CLINICAL DATA:  Suspected sepsis, fever EXAM: CHEST - 2 VIEW COMPARISON:  07/05/2020 FINDINGS: Transverse diameter of heart is increased. There is poor inspiration. There are no signs of pulmonary edema or focal pulmonary consolidation. There is no pleural effusion or pneumothorax. There is evidence of surgical fusion in the lower thoracic and possibly lumbar spine. IMPRESSION: Poor inspiration. There are no signs of pulmonary edema or focal pulmonary consolidation. Electronically Signed   By: Ernie AvenaPalani  Rathinasamy M.D.   On: 02/28/2022 14:55    EKG: not done   Labs on Admission: I have personally reviewed the available labs and imaging studies at the time of the admission.  Pertinent labs:    Na++ 130 Glucose 188 BUN 22/Creatinine 1.25/GFR >60 Lactate 1.2 WBC 9.8 Hgb 10.5 INR 1.0 UA: moderate Hgb, large LE, >300 protein (at UC) Heme negative   Assessment and Plan: Principal Problem:   Complicated UTI (urinary tract infection) Active Problems:   HLD  (hyperlipidemia)   Paraplegia (HCC)   Neurogenic bladder   Neurogenic bowel   Insulin dependent type 2 diabetes mellitus, controlled (HCC)   Essential hypertension   Wheelchair dependence    Complicated UTI -Patient presenting without sepsis physiology (tachycardia, fever without elevated lactate or hypotension) but with symptoms concerning for recurrent UTI -He was previously hospitalized with ESBL E coli UTI in 11/2019 -Today's UA again appears to demonstrate infection and is complicated due to self-caths -Will continue treatment with Zosyn (received first dose in the ER) -Will admit to telemetry for now, anticipate discharge to home once urine culture results (likely 5/21 or 5/22) unless new issues arise  DM -Recent A1c was 8.3, indicating sub-optimal control -hold Glucophage, Januvia -Continue glargine -Cover with moderate-scale SSI   HTN -Continue valsartan-HCTZ  HLD -Continue Lipitor  Paraplegia -Prior pressure ulcers but wife reports that these have resolved -He is wheelchair mobile -Continue Robaxin prn -Neurogenic bowel/bladder -Continue self-caths    Advance Care Planning:  Code Status: Full Code   Consults: TOC team  DVT Prophylaxis: Lovenox  Family Communication: Wife was present throughout evaluation  Severity of Illness: The appropriate patient status for this patient is INPATIENT. Inpatient status is judged to be reasonable and necessary in order to provide the required intensity of service to ensure the patient's safety. The patient's presenting symptoms, physical exam findings, and initial radiographic and laboratory data in the context of their chronic comorbidities is felt to place them at high risk for further clinical deterioration. Furthermore, it is not anticipated that the patient will be medically stable for discharge from the hospital within 2 midnights of admission.   * I certify that at the point of admission it is my clinical judgment that  the patient will require inpatient hospital care spanning beyond 2 midnights from the point of admission due to high intensity of service, high risk for further deterioration and high frequency of surveillance required.*  Author: Jonah Blue, MD 02/28/2022 4:01 PM  For on call review [email or web address].

## 2022-02-28 NOTE — ED Triage Notes (Signed)
Pt sent from [facility] due to fever and chills since Friday.  States he was told he has a UTI.  Reports coffee colored urine.  Pt is in a wheelchair and self-caths.    Denies pain.

## 2022-02-28 NOTE — ED Triage Notes (Addendum)
Pt is wheelchair bound. Pt c/o chills x 3 days without any other sxs. Pt states he believes he may have a UTI. States does not get frequent UTIs. Pt self caths. Pt states he no longer has decubitis ulcer documented in chart from 10/2021.

## 2022-02-28 NOTE — Progress Notes (Signed)
NEW ADMISSION NOTE New Admission Note:   Arrival Method: E.D  stretcher bed Mental Orientation: Alert and  oriented x 4 Spanish speaker,non English speaker person. Telemetry:# 20 Called and confirmed Assessment: Completed Skin: Skin intact,assessed with Sue Lush R.N. IV: Right hand NSL @KVO  Pain: Denies Tubes:None Safety Measures: Safety Fall Prevention Plan has been given, discussed and signed Admission: Completed [facility] [place] Orientation: Patient has been orientated to the room, unit and staff.  Family: n Mother at the bedside,translating on behalf of the patient.  Patient is paraplegic.  Orders have been reviewed and implemented. Will continue to monitor the patient. Call light has been placed within reach and bed alarm has been activated.   McCall, La [place]agne, RN

## 2022-02-28 NOTE — Discharge Instructions (Signed)
Please go to the emergency room for further evaluation and management as we discussed. 

## 2022-02-28 NOTE — ED Provider Notes (Signed)
[facility]    CSN: [id number] Arrival date & time: 02/28/22  1152      History   Chief Complaint Chief Complaint  Patient presents with   Chills    HPI John Meza is a 61 y.o. male.   [id number] presents today accompanied by his wife who provide the majority of history.  They are Spanish-speaking and video interpreter was utilized during visit.  Reports a 3-day history of chills and fever.  Denies additional symptoms including cough, congestion, shortness of breath, abdominal pain, diarrhea.  Patient is paraplegic and uses self-catheterization.  He reports using either a new catheter or using reusable ones that have been appropriately cleaned.  He does have a history of recurrent UTI that required hospitalization due to associated sepsis.  Denies any recent antibiotic use.  He did have 1 episode of nausea/vomiting but has been eating and drinking normally otherwise some symptom onset.  He was noted to have a sacral decubitus ulcer on his problem list but upon asking patient and family about this they report this has adequately healed and resolved.   Past Medical History:  Diagnosis Date   Cellulitis and abscess of buttock 10/2016   Diabetes mellitus    Hypertension    Paraplegia (HCC) 2013   fell from ladder   Pressure ulcer, buttock, right, unstageable (HCC) 12/30/2020   Sacral decubitus ulcer, stage IV (HCC) 11/03/2021   SCI (spinal cord injury)    Spine fracture 11/16/2011   T 11- T9-L1    Patient Active Problem List   Diagnosis Date Noted   Wheelchair dependence 12/23/2020   History of cholecystectomy 11/18/2020   Insulin dependent type 2 diabetes mellitus, controlled (HCC)    Essential hypertension    Neurogenic bladder 01/06/2012   Neurogenic bowel 01/06/2012   Paraplegia (HCC) 11/16/2011   OBESITY, MODERATE 05/26/2007   HLD (hyperlipidemia) 05/25/2007    Past Surgical History:  Procedure Laterality Date   CHOLECYSTECTOMY N/A 05/01/2015    Procedure: LAPAROSCOPIC CHOLECYSTECTOMY WITH ATTEMPTED INTRAOPERATIVE CHOLANGIOGRAM;  Surgeon: Violeta GelinasBurke Thompson, MD;  Location: [facility] OR;  Service: General;  Laterality: N/A;   SPINE SURGERY         Home Medications    Prior to Admission medications   Medication Sig Start Date End Date Taking? Authorizing Provider  atorvastatin (LIPITOR) 20 MG tablet TAKE 1 TABLET (20 MG TOTAL) BY MOUTH DAILY. TO LOWER CHOLESTEROL 11/03/21 11/03/22 Yes Storm FriskWright, Patrick E, MD  Insulin Glargine [facility](BASAGLAR KWIKPEN) 100 UNIT/ML Inject 25 Units into the skin at bedtime. 02/02/22 02/02/23 Yes Storm FriskWright, Patrick E, MD  metFORMIN (GLUCOPHAGE) 1000 MG tablet TAKE 1 TABLET (1,000 MG TOTAL) BY MOUTH 2 (TWO) TIMES DAILY WITH A MEAL. 11/03/21 11/03/22 Yes Storm FriskWright, Patrick E, MD  methocarbamol (ROBAXIN) 500 MG tablet Take 1 tablet (500 mg total) by mouth every 6 (six) hours as needed for muscle spasms. 11/03/21  Yes Storm FriskWright, Patrick E, MD  omeprazole (PRILOSEC) 20 MG capsule Take 1 capsule by mouth daily to reduce stomach acid 11/03/21 11/03/22 Yes Storm FriskWright, Patrick E, MD  sitaGLIPtin [facility](JANUVIA) 100 MG tablet Take 1 tablet by mouth daily 11/03/21  Yes Storm FriskWright, Patrick E, MD  valsartan-hydrochlorothiazide (DIOVAN-HCT) 320-25 MG tablet Take 1 tablet by mouth daily. 11/03/21  Yes Storm FriskWright, Patrick E, MD  Insulin Pen Needle (TECHLITE PEN NEEDLES) 32G X 4 MM MISC Use as directed to inject insulin 11/03/21   Storm FriskWright, Patrick E, MD  loratadine (CLARITIN) 10 MG tablet Take 1 tablet (10 mg total) by mouth daily.  As needed for itchy throat/allergy symptoms Patient not taking: Reported on 03/24/2019 12/26/18 07/05/20  Cain Saupe, MD    Family History Family History  Problem Relation Age of Onset   Healthy Mother    Diabetes Father    Diabetes Sister     Social History Social History   Tobacco Use   Smoking status: Former    Packs/day: 1.00    Years: 5.00    Pack years: 5.00    Types: Cigarettes    Quit date: 10/13/2011    Years since quitting: 10.3    Smokeless tobacco: Never   Tobacco comments:    03-24-19 per pt he stopped 1 mo ago   Vaping Use   Vaping Use: Never used  Substance Use Topics   Alcohol use: Not Currently    Alcohol/week: 1.0 standard drink    Types: 1 Cans of beer per week   Drug use: Never     Allergies   Macrobid [nitrofurantoin] and Other   Review of Systems Review of Systems  Constitutional:  Positive for activity change, appetite change, chills, fatigue and fever.  HENT:  Negative for congestion and sore throat.   Respiratory:  Negative for cough and shortness of breath.   Cardiovascular:  Negative for chest pain.  Gastrointestinal:  Positive for nausea and vomiting. Negative for abdominal pain and diarrhea.    Physical Exam Triage Vital Signs ED Triage Vitals  Enc Vitals Group     BP 02/28/22 1230 (!) 154/70     Pulse Rate 02/28/22 1230 (!) 122     Resp 02/28/22 1230 20     Temp 02/28/22 1230 (!) 102.6 F (39.2 C)     Temp Source 02/28/22 1230 Oral     SpO2 02/28/22 1230 99 %     Weight --      Height --      Head Circumference --      Peak Flow --      Pain Score 02/28/22 1232 0     Pain Loc --      Pain Edu? --      Excl. in GC? --    No data found.  Updated Vital Signs BP (!) 154/70   Pulse (!) 122   Temp (!) 102.6 F (39.2 C) (Oral)   Resp 20   SpO2 99%   Visual Acuity Right Eye Distance:   Left Eye Distance:   Bilateral Distance:    Right Eye Near:   Left Eye Near:    Bilateral Near:     Physical Exam Vitals reviewed.  Constitutional:      General: He is awake.     Appearance: Normal appearance. He is well-developed. He is not ill-appearing.     Comments: Very pleasant male appears stated age in no acute distress sitting comfortably in wheelchair in exam room  HENT:     Head: Normocephalic and atraumatic.  Cardiovascular:     Rate and Rhythm: Regular rhythm. Tachycardia present.     Heart sounds: Normal heart sounds, S1 normal and S2 normal. No murmur  heard. Pulmonary:     Effort: Pulmonary effort is normal.     Breath sounds: Normal breath sounds. No stridor. No wheezing, rhonchi or rales.     Comments: Clear to auscultation bilaterally Abdominal:     General: Bowel sounds are normal.     Palpations: Abdomen is soft.     Tenderness: There is no abdominal tenderness. There is no right CVA tenderness,  left CVA tenderness, guarding or rebound.  Neurological:     Mental Status: He is alert.  Psychiatric:        Behavior: Behavior is cooperative.     UC Treatments / Results  Labs (all labs ordered are listed, but only abnormal results are displayed) Labs Reviewed  POCT URINALYSIS DIPSTICK, ED / UC - Abnormal; Notable for the following components:      Result Value   Hgb urine dipstick MODERATE (*)    Protein, ur >=300 (*)    Leukocytes,Ua LARGE (*)    All other components within normal limits    EKG   Radiology No results found.  Procedures Procedures (including critical care time)  Medications Ordered in UC Medications  acetaminophen (TYLENOL) tablet 650 mg (650 mg Oral Given 02/28/22 1250)    Initial Impression / Assessment and Plan / UC Course  I have reviewed the triage vital signs and the nursing notes.  Pertinent labs & imaging results that were available during my care of the patient were reviewed by me and considered in my medical decision making (see chart for details).     Given severity of fever and tachycardia patient meets SIRS criteria and concern for sepsis.  Given history of sepsis related to UTI requiring hospitalization recommend going to the emergency room for further evaluation and management as he may need IV antibiotics and stat labs.  Patient is agreeable and will go directly to [facility], ER following visit.  Vital signs were stable at the time of discharge and wife will transport him to ER immediately following visit.  Final Clinical Impressions(s) / UC Diagnoses   Final diagnoses:  SIRS  (systemic inflammatory response syndrome) (HCC)  Chills  Fever, unspecified fever cause  Acute cystitis with hematuria     Discharge Instructions      Please go to the emergency room for further evaluation and management as we discussed.     ED Prescriptions   None    PDMP not reviewed this encounter.   Jeani Hawking, PA-C 02/28/22 1309

## 2022-03-01 DIAGNOSIS — A4151 Sepsis due to Escherichia coli [E. coli]: Secondary | ICD-10-CM

## 2022-03-01 DIAGNOSIS — Z1612 Extended spectrum beta lactamase (ESBL) resistance: Secondary | ICD-10-CM

## 2022-03-01 DIAGNOSIS — B9629 Other Escherichia coli [E. coli] as the cause of diseases classified elsewhere: Secondary | ICD-10-CM

## 2022-03-01 HISTORY — DX: Sepsis due to Escherichia coli (e. coli): A41.51

## 2022-03-01 LAB — GLUCOSE, CAPILLARY
Glucose-Capillary: 160 mg/dL — ABNORMAL HIGH (ref 70–99)
Glucose-Capillary: 155 mg/dL — ABNORMAL HIGH (ref 70–99)
Glucose-Capillary: 183 mg/dL — ABNORMAL HIGH (ref 70–99)
Glucose-Capillary: 188 mg/dL — ABNORMAL HIGH (ref 70–99)

## 2022-03-01 LAB — URINALYSIS, ROUTINE W REFLEX MICROSCOPIC
Bilirubin Urine: NEGATIVE
Glucose, UA: NEGATIVE mg/dL
Ketones, ur: NEGATIVE mg/dL
Nitrite: NEGATIVE
Protein, ur: 100 mg/dL — AB
Specific Gravity, Urine: 1.011 (ref 1.005–1.030)
pH: 5 (ref 5.0–8.0)

## 2022-03-01 MED ORDER — SODIUM CHLORIDE 0.9 % IV SOLN
1.0000 g | Freq: Three times a day (TID) | INTRAVENOUS | Status: AC
  Administered 2022-03-01 – 2022-03-04 (×12): 1 g via INTRAVENOUS
  Filled 2022-03-01 (×13): qty 20

## 2022-03-01 NOTE — Hospital Course (Addendum)
John Meza 61 y.o. male with history of DM, HTN, and paraplegia with buttocks/sacral pressure ulcers, neurogenic bladder dependent on self-catheterization who presented with chills and concern for recurrent UTI.  Work-up was notable for ESBL E. coli bacteremia.  He was followed by infectious disease during hospitalization.  He was treated with meropenem with plans for transitioning to ertapenem to complete course at discharge.

## 2022-03-01 NOTE — Progress Notes (Signed)
PHARMACY - PHYSICIAN COMMUNICATION CRITICAL VALUE ALERT - BLOOD CULTURE IDENTIFICATION (BCID)  John Meza is an 61 y.o. male who presented to [facility] on 02/28/2022 with a chief complaint of recurrent UTI.  Assessment:   2/4 blood cultures (1 from each set, taken from separate arms/locations) currently growing CTX-M positive E. coli   Name of physician (or Provider) Contacted: Dr. Jonathon Bellows   Current antibiotics: Zosyn  Changes to prescribed antibiotics recommended:  Change Zosyn to Meropenem  Recommendations accepted by provider   Thank you for allowing pharmacy to be a part of this patient's care.  Thelma Barge, PharmD Clinical Pharmacist

## 2022-03-01 NOTE — Progress Notes (Signed)
PROGRESS NOTE Jamyson Mitcham  [id number] DOB: 1961/03/25 DOA: 02/28/2022 PCP: [name], MD   Brief Narrative/Hospital Course:  61 y.o. male with history of DM; HTN; and paraplegia with buttocks/sacral pressure ulcers, who does self-catheterization and has no sensation of bowel or bladder function and wheelchair-bound presenting with recurrent UTI-presenting with chills and shaking that he started on last Friday with subjective fever at home.  In the ED febrile,initially went to UC -> ER.  Heart rate 113 fever 101.8, RR 18>23,but no leukocytosis, procalcitonin elevated 6.5.  EEG was abnormal with WBC 11-20, leukocytes UA small.  Placed on antibiotics and admitted.  Blood culture came back positive and CTX-M ESBL E. Coli     Subjective: Seen and examined this morning.  Tmax 101.8 on admission, currently afebrile.  BP stable. Blood culture came back positive this morning. Family at bedside.  Assessment and Plan: Principal Problem:   UTI due to extended-spectrum beta lactamase (ESBL) producing Escherichia coli Active Problems:   HLD (hyperlipidemia)   Paraplegia (HCC)   Neurogenic bladder   Neurogenic bowel   Insulin dependent type 2 diabetes mellitus, controlled ([place])   Essential hypertension   Wheelchair dependence   Sepsis due to Escherichia coli (E. coli) (HCC)   Sepsis POA on admission with heart rate 113 fever 101.37m respiration rate 18>23 ESBL E Coli sepsis/bacteremia and likely due to UTI.   Antibiotic changed to meropenem.  Follow-up culture data.  At this time hemodynamically stable.  White counts normal.  Continue antibiotics pending further culture sensitivity. Recent Labs  Lab 02/28/22 1409 02/28/22 1836  WBC 9.8  --   LATICACIDVEN 1.2 1.7  PROCALCITON  --  6.59     HLD continue Lipitor  Paraplegia Neurogenic bladder Neurogenic bowel Wheelchair-bound: Supportive care, patient does intermittent self-catheterization at home.  Insulin dependent  type 2 diabetes mellitus, with uncontrolled hyperglycemia 233 on admission : Continue glargine SSI for now.  A1c 8.3.   Recent Labs  Lab 02/28/22 2246 03/01/22 0720 03/01/22 1122  GLUCAP 233* 160* 155*    Essential hypertension: BP stable.  On valsartan HCTZ  Class I Obesity:Patient's Body mass index is 32.92 kg/m. : Will benefit with PCP follow-up, weight loss  healthy lifestyle and outpatient sleep evaluation.   DVT prophylaxis: enoxaparin (LOVENOX) injection 40 mg Start: 02/28/22 2000 Code Status:   Code Status: Full Code Family Communication: plan of care discussed with patient at bedside. Patient status is: Inpatient because of ongoing management of sepsis. Level of care: Telemetry Medical   Dispo: The patient is from: Home            Anticipated disposition: home in 2-3 days in  Mobility Assessment (last 72 hours)     Mobility Assessment     Row Name 03/01/22 0819 02/28/22 1931         Does patient have an order for bedrest or is patient medically unstable No - Continue assessment No - Continue assessment      What is the highest level of mobility based on the progressive mobility assessment? Level 2 (Chairfast) - Balance while sitting on edge of bed and cannot stand Level 2 (Chairfast) - Balance while sitting on edge of bed and cannot stand      Is the above level different from baseline mobility prior to current illness? No - Consider discontinuing PT/OT No - Consider discontinuing PT/OT                Objective: Vitals last 24 hrs: Vitals:  02/28/22 1753 02/28/22 2248 03/01/22 0524 03/01/22 0908  BP: 132/68 (!) 146/72 129/73 123/60  Pulse: 83 93 97 95  Resp: 15 19 18 18   Temp: 98.6 F (37 C) (!) 100.7 F (38.2 C) 98.6 F (37 C) 98.6 F (37 C)  TempSrc: Oral Oral Oral Oral  SpO2: 100% 100% 98% 98%  Weight: 87 kg     Height: 5\' 4"  (1.626 m)      Weight change:   Physical Examination: General exam: alert awake,older than stated age, weak  appearing. HEENT:Oral mucosa moist, Ear/Nose WNL grossly, dentition normal. Respiratory system: bilaterally diminished BS, no use of accessory muscle Cardiovascular system: S1 & S2 +, No JVD. Gastrointestinal system: Abdomen soft,NT,ND, BS+ Nervous System:Alert, awake, paraplegia  extremities: LE edema ,distal peripheral pulses palpable.  Skin: No rashes,no icterus. MSK: Normal muscle bulk,tone, power  Medications reviewed:  Scheduled Meds:  atorvastatin  20 mg Oral Daily   docusate sodium  100 mg Oral BID   enoxaparin (LOVENOX) injection  40 mg Subcutaneous Q24H   hydrochlorothiazide  25 mg Oral Daily   insulin aspart  0-15 Units Subcutaneous TID WC   insulin aspart  0-5 Units Subcutaneous QHS   insulin glargine-yfgn  25 Units Subcutaneous QHS   irbesartan  300 mg Oral Daily   pantoprazole  40 mg Oral Daily   sodium chloride flush  3 mL Intravenous Q12H   Continuous Infusions:  lactated ringers 75 mL/hr at 03/01/22 0954   meropenem (MERREM) IV 1 g (03/01/22 1003)    Pressure Injury 11/03/16 Stage II -  Partial thickness loss of dermis presenting as a shallow open ulcer with a red, pink wound bed without slough. this is a partial thickness wound related to cellulitis, NOT a pressure injury (Active)  11/03/16 2020  Location: Buttocks  Location Orientation: Right  Staging: Stage II -  Partial thickness loss of dermis presenting as a shallow open ulcer with a red, pink wound bed without slough.  Wound Description (Comments): this is a partial thickness wound related to cellulitis, NOT a pressure injury  Present on Admission: Yes   Diet Order             Diet Carb Modified Fluid consistency: Thin; Room service appropriate? Yes  Diet effective now                            Intake/Output Summary (Last 24 hours) at 03/01/2022 1134 Last data filed at 03/01/2022 0600 Gross per 24 hour  Intake 713.23 ml  Output 450 ml  Net 263.23 ml   Net IO Since Admission: 263.23 mL  [03/01/22 1134]  Wt Readings from Last 3 Encounters:  02/28/22 87 kg  05/14/21 76.7 kg  12/17/17 76.7 kg     Unresulted Labs (From admission, onward)     Start     Ordered   02/28/22 1547  Urine Culture  Once,   R       Question:  Indication  Answer:  Sepsis   02/28/22 1551          Data Reviewed: I have personally reviewed following labs and imaging studies CBC: Recent Labs  Lab 02/28/22 1409  WBC 9.8  NEUTROABS 8.1*  HGB 10.5*  HCT 31.8*  MCV 86.4  PLT 123456*   Basic Metabolic Panel: Recent Labs  Lab 02/28/22 1409  NA 130*  K 4.2  CL 101  CO2 21*  GLUCOSE 188*  BUN 22*  CREATININE 1.25*  CALCIUM 8.5*   GFR: Estimated Creatinine Clearance: 64.1 mL/min (A) (by C-G formula based on SCr of 1.25 mg/dL (H)). Liver Function Tests: Recent Labs  Lab 02/28/22 1409  AST 13*  ALT 20  ALKPHOS 108  BILITOT 1.0  PROT 6.5  ALBUMIN 2.8*   No results for input(s): LIPASE, AMYLASE in the last 168 hours. No results for input(s): AMMONIA in the last 168 hours. Coagulation Profile: Recent Labs  Lab 02/28/22 1409  INR 1.0   BNP (last 3 results) No results for input(s): PROBNP in the last 8760 hours. HbA1C: No results for input(s): HGBA1C in the last 72 hours. CBG: Recent Labs  Lab 02/28/22 2246 03/01/22 0720 03/01/22 1122  GLUCAP 233* 160* 155*   Lipid Profile: No results for input(s): CHOL, HDL, LDLCALC, TRIG, CHOLHDL, LDLDIRECT in the last 72 hours. Thyroid Function Tests: No results for input(s): TSH, T4TOTAL, FREET4, T3FREE, THYROIDAB in the last 72 hours. Sepsis Labs: Recent Labs  Lab 02/28/22 1409 02/28/22 1836  PROCALCITON  --  6.59  LATICACIDVEN 1.2 1.7    Recent Results (from the past 240 hour(s))  Culture, blood (Routine x 2)     Status: None (Preliminary result)   Collection Time: 02/28/22  2:07 PM   Specimen: BLOOD RIGHT FOREARM  Result Value Ref Range Status   Specimen Description BLOOD RIGHT FOREARM  Final   Special Requests    Final    BOTTLES DRAWN AEROBIC AND ANAEROBIC Blood Culture results may not be optimal due to an excessive volume of blood received in culture bottles   Culture  Setup Time   Final    GRAM NEGATIVE RODS IN BOTH AEROBIC AND ANAEROBIC BOTTLES CRITICAL RESULT CALLED TO, READ BACK BY AND VERIFIED WITH: PHARMD A WELLBORN [id number] AT 708 AM BY CM Performed at [facility], [place] [address] [place], [place] [address]    Culture GRAM NEGATIVE RODS  Final   Report Status PENDING  Incomplete  Blood Culture ID Panel (Reflexed)     Status: Abnormal (Preliminary result)   Collection Time: 02/28/22  2:07 PM  Result Value Ref Range Status   Enterococcus faecalis NOT DETECTED NOT DETECTED Final   Enterococcus Faecium NOT DETECTED NOT DETECTED Final   Listeria monocytogenes NOT DETECTED NOT DETECTED Final   Staphylococcus species NOT DETECTED NOT DETECTED Final   Staphylococcus aureus (BCID) NOT DETECTED NOT DETECTED Final   Staphylococcus epidermidis NOT DETECTED NOT DETECTED Final   Staphylococcus lugdunensis NOT DETECTED NOT DETECTED Final   Streptococcus species NOT DETECTED NOT DETECTED Final   Streptococcus agalactiae NOT DETECTED NOT DETECTED Final   Streptococcus pneumoniae NOT DETECTED NOT DETECTED Final   Streptococcus pyogenes NOT DETECTED NOT DETECTED Final   A.calcoaceticus-baumannii NOT DETECTED NOT DETECTED Final   Bacteroides fragilis NOT DETECTED NOT DETECTED Final   Enterobacterales PENDING NOT DETECTED Incomplete   Enterobacter cloacae complex NOT DETECTED NOT DETECTED Final   Escherichia coli DETECTED (A) NOT DETECTED Final    Comment: CRITICAL RESULT CALLED TO, READ BACK BY AND VERIFIED WITH: PHARMD A WELLBORN [id number] AT 708 AM BY CM    Klebsiella aerogenes NOT DETECTED NOT DETECTED Final   Klebsiella oxytoca NOT DETECTED NOT DETECTED Final   Klebsiella pneumoniae NOT DETECTED NOT DETECTED Final   Proteus species NOT DETECTED NOT DETECTED Final   Salmonella species NOT  DETECTED NOT DETECTED Final   Serratia marcescens NOT DETECTED NOT DETECTED Final   Haemophilus influenzae NOT DETECTED NOT DETECTED Final  Neisseria meningitidis NOT DETECTED NOT DETECTED Final   Pseudomonas aeruginosa NOT DETECTED NOT DETECTED Final   Stenotrophomonas maltophilia NOT DETECTED NOT DETECTED Final   Candida albicans NOT DETECTED NOT DETECTED Final   Candida auris NOT DETECTED NOT DETECTED Final   Candida glabrata NOT DETECTED NOT DETECTED Final   Candida krusei NOT DETECTED NOT DETECTED Final   Candida parapsilosis NOT DETECTED NOT DETECTED Final   Candida tropicalis NOT DETECTED NOT DETECTED Final   Cryptococcus neoformans/gattii NOT DETECTED NOT DETECTED Final   CTX-M ESBL DETECTED (A) NOT DETECTED Final    Comment: CRITICAL RESULT CALLED TO, READ BACK BY AND VERIFIED WITH: PHARMD A WELLBORN [id number] AT 708 AM BY CM (NOTE) Extended spectrum beta-lactamase detected. Recommend a carbapenem as initial therapy.      Carbapenem resistance IMP NOT DETECTED NOT DETECTED Final   Carbapenem resistance KPC NOT DETECTED NOT DETECTED Final   Carbapenem resistance NDM NOT DETECTED NOT DETECTED Final   Carbapenem resist OXA 48 LIKE NOT DETECTED NOT DETECTED Final   Carbapenem resistance VIM NOT DETECTED NOT DETECTED Final    Comment: Performed at [facility], [place] [address] [place], [place] [address]  Culture, blood (Routine x 2)     Status: None (Preliminary result)   Collection Time: 02/28/22  2:09 PM   Specimen: BLOOD  Result Value Ref Range Status   Specimen Description BLOOD BLOOD RIGHT FOREARM  Final   Special Requests   Final    BOTTLES DRAWN AEROBIC AND ANAEROBIC Blood Culture adequate volume   Culture  Setup Time   Final    GRAM NEGATIVE RODS ANAEROBIC BOTTLE ONLY CRITICAL VALUE NOTED.  VALUE IS CONSISTENT WITH PREVIOUSLY REPORTED AND CALLED VALUE. Performed at [facility], [place] [address], [place], [place] [address]    Culture GRAM NEGATIVE RODS   Final   Report Status PENDING  Incomplete    Antimicrobials: Anti-infectives (From admission, onward)    Start     Dose/Rate Route Frequency Ordered Stop   03/01/22 0800  meropenem (MERREM) 1 g in sodium chloride 0.9 % 100 mL IVPB        1 g 200 mL/hr over 30 Minutes Intravenous Every 8 hours 03/01/22 0733     02/28/22 2300  piperacillin-tazobactam (ZOSYN) IVPB 3.375 g  Status:  Discontinued        3.375 g 12.5 mL/hr over 240 Minutes Intravenous Every 8 hours 02/28/22 1747 03/01/22 0733   02/28/22 1500  piperacillin-tazobactam (ZOSYN) IVPB 3.375 g        3.375 g 12.5 mL/hr over 240 Minutes Intravenous  Once 02/28/22 1449 02/28/22 1816      Culture/Microbiology    Component Value Date/Time   SDES BLOOD BLOOD RIGHT FOREARM 02/28/2022 1409   SPECREQUEST  02/28/2022 1409    BOTTLES DRAWN AEROBIC AND ANAEROBIC Blood Culture adequate volume   CULT GRAM NEGATIVE RODS 02/28/2022 1409   REPTSTATUS PENDING 02/28/2022 1409   Radiology Studies: DG Chest 2 View  Result Date: 02/28/2022 CLINICAL DATA:  Suspected sepsis, fever EXAM: CHEST - 2 VIEW COMPARISON:  07/05/2020 FINDINGS: Transverse diameter of heart is increased. There is poor inspiration. There are no signs of pulmonary edema or focal pulmonary consolidation. There is no pleural effusion or pneumothorax. There is evidence of surgical fusion in the lower thoracic and possibly lumbar spine. IMPRESSION: Poor inspiration. There are no signs of pulmonary edema or focal pulmonary consolidation. Electronically Signed   By: Elmer Picker M.D.   On:  02/28/2022 14:55     LOS: 1 day   Antonieta Pert, MD [facility]  03/01/2022, 11:34 AM

## 2022-03-01 NOTE — Progress Notes (Signed)
Pharmacy Antibiotic Note  John Meza is a 61 y.o. male for which pharmacy has been consulted for zosyn dosing for UTI.  Patient with a history of self-cath, DM; HTN; and paraplegia with buttocks/sacral pressure ulcers. Patient presenting with fever and tachycardia.  Grew ESBL in 2021 no recent cultures was sensitive to zosyn at that time. Blood cultures now growing CTX-M E. Coli in 2/4 bottles.  SCr 1.25 - above baseline WBC 9.8; LA 1.2; T 101.8 F; HR 113; RR 18>23  Plan: Meropenem 1g Q8h  Trend WBC, fever, renal function, & susceptibilities  F/u cultures, clinical course, WBC, fever De-escalate when able  Height: 5\' 4"  (162.6 cm) Weight: 87 kg (191 lb 12.8 oz) IBW/kg (Calculated) : 59.2  Temp (24hrs), Avg:100.5 F (38.1 C), Min:98.6 F (37 C), Max:102.6 F (39.2 C)  Recent Labs  Lab 02/28/22 1409 02/28/22 1836  WBC 9.8  --   CREATININE 1.25*  --   LATICACIDVEN 1.2 1.7    Estimated Creatinine Clearance: 64.1 mL/min (A) (by C-G formula based on SCr of 1.25 mg/dL (H)).    Allergies  Allergen Reactions   Macrobid [Nitrofurantoin] Itching and Rash    Antimicrobials this admission: zosyn 5/20 > 5/21  Microbiology results: 5/20 blood culture: E. coli (CTX-M positive)   Thank you for allowing pharmacy to be a part of this patient's care.  Ardyth Harps, PharmD Clinical Pharmacist

## 2022-03-02 LAB — BLOOD CULTURE ID PANEL (REFLEXED) - BCID2

## 2022-03-02 LAB — GLUCOSE, CAPILLARY
Glucose-Capillary: 156 mg/dL — ABNORMAL HIGH (ref 70–99)
Glucose-Capillary: 163 mg/dL — ABNORMAL HIGH (ref 70–99)
Glucose-Capillary: 172 mg/dL — ABNORMAL HIGH (ref 70–99)
Glucose-Capillary: 254 mg/dL — ABNORMAL HIGH (ref 70–99)

## 2022-03-02 LAB — URINE CULTURE: Culture: NO GROWTH

## 2022-03-02 LAB — CBC WITH DIFFERENTIAL/PLATELET
Abs Immature Granulocytes: 0.05 10*3/uL (ref 0.00–0.07)
Basophils Absolute: 0 10*3/uL (ref 0.0–0.1)
Basophils Relative: 0 %
Eosinophils Absolute: 0 10*3/uL (ref 0.0–0.5)
Eosinophils Relative: 0 %
HCT: 28.5 % — ABNORMAL LOW (ref 39.0–52.0)
Hemoglobin: 9.6 g/dL — ABNORMAL LOW (ref 13.0–17.0)
Immature Granulocytes: 1 %
Lymphocytes Relative: 15 %
Lymphs Abs: 1.2 10*3/uL (ref 0.7–4.0)
MCH: 28.7 pg (ref 26.0–34.0)
MCHC: 33.7 g/dL (ref 30.0–36.0)
MCV: 85.3 fL (ref 80.0–100.0)
Monocytes Absolute: 1.2 10*3/uL — ABNORMAL HIGH (ref 0.1–1.0)
Monocytes Relative: 16 %
Neutro Abs: 5.4 10*3/uL (ref 1.7–7.7)
Neutrophils Relative %: 68 %
Platelets: 175 10*3/uL (ref 150–400)
RBC: 3.34 MIL/uL — ABNORMAL LOW (ref 4.22–5.81)
RDW: 12.9 % (ref 11.5–15.5)
WBC: 8 10*3/uL (ref 4.0–10.5)
nRBC: 0 % (ref 0.0–0.2)

## 2022-03-02 LAB — BASIC METABOLIC PANEL
Anion gap: 9 (ref 5–15)
BUN: 13 mg/dL (ref 6–20)
CO2: 23 mmol/L (ref 22–32)
Calcium: 8.2 mg/dL — ABNORMAL LOW (ref 8.9–10.3)
Chloride: 100 mmol/L (ref 98–111)
Creatinine, Ser: 1.21 mg/dL (ref 0.61–1.24)
GFR, Estimated: >60 mL/min (ref >60)
Glucose, Bld: 182 mg/dL — ABNORMAL HIGH (ref 70–99)
Potassium: 4.1 mmol/L (ref 3.5–5.1)
Sodium: 132 mmol/L — ABNORMAL LOW (ref 135–145)

## 2022-03-02 NOTE — Progress Notes (Signed)
PROGRESS NOTE John PavlovJose Meza  ZOX:[id number] DOB: [birth year] DOA: 02/28/2022 PCP: John FriskWright, Patrick E, MD   Brief Narrative/Hospital Course:  61 y.o. male with history of DM; HTN; and paraplegia with buttocks/sacral pressure ulcers, who does self-catheterization and has no sensation of bowel or bladder function and wheelchair-bound presenting with recurrent UTI-presenting with chills and shaking that he started on last Friday with subjective fever at home.  In the ED febrile,initially went to UC -> ER.  Heart rate 113 fever 101.8, RR 18>23,but no leukocytosis, procalcitonin elevated 6.5.  EEG was abnormal with WBC 11-20, leukocytes UA small.  Placed on antibiotics and admitted.  Blood culture came back positive and CTX-M ESBL Meza. Coli     Subjective: Seen and examined this morning.  Resting comfortably.  No family at the bedside. Patient remains afebrile no new complaints.    Assessment and Plan: Principal Problem:   UTI due to extended-spectrum beta lactamase (ESBL) producing Escherichia coli Active Problems:   HLD (hyperlipidemia)   Paraplegia (HCC)   Neurogenic bladder   Neurogenic bowel   Insulin dependent type 2 diabetes mellitus, controlled (HCC)   Essential hypertension   Wheelchair dependence   Sepsis due to Escherichia coli (Meza. coli) (HCC)   Sepsis POA on admission( with HR 113 fever 101.2958m respiration rate 18>23) ESBL Meza Coli sepsis/bacteremia and likely due to UTI.   Febrile on admission, and now afebrile since.Continue meropenem IV. Wbc stable.  HLD continue Lipitor  Paraplegia Neurogenic bladder Neurogenic bowel Wheelchair-bound: Cont supportive care, patient does intermittent self-catheterization at home, advised to fu with his urology and advised to maintain hygiene with catheter.  Insulin dependent type 2 diabetes mellitus, with uncontrolled hyperglycemia 233 on admission : sugar stable, cont on ssi , Hba1c 8.3.   Recent Labs  Lab 03/01/22 0720 03/01/22 1122  03/01/22 1539 03/01/22 2020 03/02/22 0712  GLUCAP 160* 155* 183* 188* 156*    Essential hypertension: BP stable, cont Valsartan/HCTZ  Class I Obesity:Body mass index is 32.92 kg/m. : Will benefit with PCP follow-up, weight loss  healthy lifestyle and outpatient sleep evaluation.   DVT prophylaxis: enoxaparin (LOVENOX) injection 40 mg Start: 02/28/22 2000 Code Status:   Code Status: Full Code Family Communication: plan of care discussed with patient at bedside. Patient status is: Inpatient because of ongoing management of sepsis. Level of care: Telemetry Medical   Dispo: The patient is from: Home            Anticipated disposition: Home after completion of IV antibiotics  Mobility Assessment (last 72 hours)     Mobility Assessment     Row Name 03/01/22 2200 03/01/22 0819 02/28/22 1931       Does patient have an order for bedrest or is patient medically unstable No - Continue assessment No - Continue assessment No - Continue assessment     What is the highest level of mobility based on the progressive mobility assessment? Level 2 (Chairfast) - Balance while sitting on edge of bed and cannot stand Level 2 (Chairfast) - Balance while sitting on edge of bed and cannot stand Level 2 (Chairfast) - Balance while sitting on edge of bed and cannot stand     Is the above level different from baseline mobility prior to current illness? No - Consider discontinuing PT/OT No - Consider discontinuing PT/OT No - Consider discontinuing PT/OT               Objective: Vitals last 24 hrs: Vitals:   03/01/22 1538 03/01/22 2020 03/02/22  0439 03/02/22 0818  BP: 135/70 132/63 (!) 147/68 (!) 141/71  Pulse: 93 86 (!) 105 99  Resp: Temp: 98.7 F (37.1 C) 98.7 F (37.1 C) 99.6 F (37.6 C) 98.8 F (37.1 C)  TempSrc: Oral Oral Oral Oral  SpO2: 98% 100% 98% 97%  Weight:      Height:       Weight change:   Physical Examination: General exam: Aaox3, obese,older than stated age,  weak appearing. HEENT:Oral mucosa moist, Ear/Nose WNL grossly, dentition normal. Respiratory system: bilaterally diminished,no use of accessory muscle Cardiovascular system: S1 & S2 +, No JVD,. Gastrointestinal system: Abdomen soft,NT,ND, BS+ Nervous System:Alert, awake, moving extremities and grossly nonfocal Extremities: Paraplegic   Skin: No rashes,no icterus. MSK: Normal muscle bulk,tone, power   Medications reviewed:  Scheduled Meds:  atorvastatin  20 mg Oral Daily   docusate sodium  100 mg Oral BID   enoxaparin (LOVENOX) injection  40 mg Subcutaneous Q24H   hydrochlorothiazide  25 mg Oral Daily   insulin aspart  0-15 Units Subcutaneous TID WC   insulin aspart  0-5 Units Subcutaneous QHS   insulin glargine-yfgn  25 Units Subcutaneous QHS   irbesartan  300 mg Oral Daily   pantoprazole  40 mg Oral Daily   sodium chloride flush  3 mL Intravenous Q12H   Continuous Infusions:  meropenem (MERREM) IV 1 g (03/02/22 0522)   Diet Order             Diet Carb Modified Fluid consistency: Thin; Room service appropriate? Yes  Diet effective now                  Intake/Output Summary (Last 24 hours) at 03/02/2022 1025 Last data filed at 03/02/2022 0900 Gross per 24 hour  Intake 2217.06 ml  Output --  Net 2217.06 ml   Net IO Since Admission: 2,800.29 mL [03/02/22 1025]  Wt Readings from Last 3 Encounters:  02/28/22 87 kg  05/14/21 76.7 kg  12/17/17 76.7 kg     Unresulted Labs (From admission, onward)    None     Data Reviewed: I have personally reviewed following labs and imaging studies CBC: Recent Labs  Lab 02/28/22 1409  WBC 9.8  NEUTROABS 8.1*  HGB 10.5*  HCT 31.8*  MCV 86.4  PLT 149*   Basic Metabolic Panel: Recent Labs  Lab 02/28/22 1409  NA 130*  K 4.2  CL 101  CO2 21*  GLUCOSE 188*  BUN 22*  CREATININE 1.25*  CALCIUM 8.5*   GFR: Estimated Creatinine Clearance: 64.1 mL/min (A) (by C-G formula based on SCr of 1.25 mg/dL (H)). Liver Function  Tests: Recent Labs  Lab 02/28/22 1409  AST 13*  ALT 20  ALKPHOS 108  BILITOT 1.0  PROT 6.5  ALBUMIN 2.8*   No results for input(s): LIPASE, AMYLASE in the last 168 hours. No results for input(s): AMMONIA in the last 168 hours. Coagulation Profile: Recent Labs  Lab 02/28/22 1409  INR 1.0   BNP (last 3 results) No results for input(s): PROBNP in the last 8760 hours. HbA1C: No results for input(s): HGBA1C in the last 72 hours. CBG: Recent Labs  Lab 03/01/22 0720 03/01/22 1122 03/01/22 1539 03/01/22 2020 03/02/22 0712  GLUCAP 160* 155* 183* 188* 156*   Lipid Profile: No results for input(s): CHOL, HDL, LDLCALC, TRIG, CHOLHDL, LDLDIRECT in the last 72 hours. Thyroid Function Tests: No results for input(s): TSH, T4TOTAL, FREET4, T3FREE, THYROIDAB in the last 72 hours.  Sepsis Labs: Recent Labs  Lab 02/28/22 1409 02/28/22 1836  PROCALCITON  --  6.59  LATICACIDVEN 1.2 1.7    Recent Results (from the past 240 hour(s))  Culture, blood (Routine x 2)     Status: Abnormal (Preliminary result)   Collection Time: 02/28/22  2:07 PM   Specimen: BLOOD RIGHT FOREARM  Result Value Ref Range Status   Specimen Description BLOOD RIGHT FOREARM  Final   Special Requests   Final    BOTTLES DRAWN AEROBIC AND ANAEROBIC Blood Culture results may not be optimal due to an excessive volume of blood received in culture bottles   Culture  Setup Time   Final    GRAM NEGATIVE RODS IN BOTH AEROBIC AND ANAEROBIC BOTTLES CRITICAL RESULT CALLED TO, READ BACK BY AND VERIFIED WITH: PHARMD A WELLBORN [id number] AT 708 AM BY CM    Culture (A)  Final    ESCHERICHIA COLI SUSCEPTIBILITIES TO FOLLOW Performed at [facility], [address] [place], [place] [address]    Report Status PENDING  Incomplete  Blood Culture ID Panel (Reflexed)     Status: Abnormal (Preliminary result)   Collection Time: 02/28/22  2:07 PM  Result Value Ref Range Status   Enterococcus faecalis NOT DETECTED NOT DETECTED  Final   Enterococcus Faecium NOT DETECTED NOT DETECTED Final   Listeria monocytogenes NOT DETECTED NOT DETECTED Final   Staphylococcus species NOT DETECTED NOT DETECTED Final   Staphylococcus aureus (BCID) NOT DETECTED NOT DETECTED Final   Staphylococcus epidermidis NOT DETECTED NOT DETECTED Final   Staphylococcus lugdunensis NOT DETECTED NOT DETECTED Final   Streptococcus species NOT DETECTED NOT DETECTED Final   Streptococcus agalactiae NOT DETECTED NOT DETECTED Final   Streptococcus pneumoniae NOT DETECTED NOT DETECTED Final   Streptococcus pyogenes NOT DETECTED NOT DETECTED Final   A.calcoaceticus-baumannii NOT DETECTED NOT DETECTED Final   Bacteroides fragilis NOT DETECTED NOT DETECTED Final   Enterobacterales PENDING NOT DETECTED Incomplete   Enterobacter cloacae complex NOT DETECTED NOT DETECTED Final   Escherichia coli DETECTED (A) NOT DETECTED Final    Comment: CRITICAL RESULT CALLED TO, READ BACK BY AND VERIFIED WITH: PHARMD A WELLBORN [id number] AT 708 AM BY CM    Klebsiella aerogenes NOT DETECTED NOT DETECTED Final   Klebsiella oxytoca NOT DETECTED NOT DETECTED Final   Klebsiella pneumoniae NOT DETECTED NOT DETECTED Final   Proteus species NOT DETECTED NOT DETECTED Final   Salmonella species NOT DETECTED NOT DETECTED Final   Serratia marcescens NOT DETECTED NOT DETECTED Final   Haemophilus influenzae NOT DETECTED NOT DETECTED Final   Neisseria meningitidis NOT DETECTED NOT DETECTED Final   Pseudomonas aeruginosa NOT DETECTED NOT DETECTED Final   Stenotrophomonas maltophilia NOT DETECTED NOT DETECTED Final   Candida albicans NOT DETECTED NOT DETECTED Final   Candida auris NOT DETECTED NOT DETECTED Final   Candida glabrata NOT DETECTED NOT DETECTED Final   Candida krusei NOT DETECTED NOT DETECTED Final   Candida parapsilosis NOT DETECTED NOT DETECTED Final   Candida tropicalis NOT DETECTED NOT DETECTED Final   Cryptococcus neoformans/gattii NOT DETECTED NOT DETECTED Final    CTX-M ESBL DETECTED (A) NOT DETECTED Final    Comment: CRITICAL RESULT CALLED TO, READ BACK BY AND VERIFIED WITH: PHARMD A WELLBORN [id number] AT 708 AM BY CM (NOTE) Extended spectrum beta-lactamase detected. Recommend a carbapenem as initial therapy.      Carbapenem resistance IMP NOT DETECTED NOT DETECTED Final   Carbapenem resistance KPC NOT DETECTED NOT DETECTED Final  Carbapenem resistance NDM NOT DETECTED NOT DETECTED Final   Carbapenem resist OXA 48 LIKE NOT DETECTED NOT DETECTED Final   Carbapenem resistance VIM NOT DETECTED NOT DETECTED Final    Comment: Performed at [facility], [address] [place], [place] [address]  Culture, blood (Routine x 2)     Status: None (Preliminary result)   Collection Time: 02/28/22  2:09 PM   Specimen: BLOOD  Result Value Ref Range Status   Specimen Description BLOOD BLOOD RIGHT FOREARM  Final   Special Requests   Final    BOTTLES DRAWN AEROBIC AND ANAEROBIC Blood Culture adequate volume   Culture  Setup Time   Final    GRAM NEGATIVE RODS ANAEROBIC BOTTLE ONLY CRITICAL VALUE NOTED.  VALUE IS CONSISTENT WITH PREVIOUSLY REPORTED AND CALLED VALUE.    Culture   Final    GRAM NEGATIVE RODS IDENTIFICATION TO FOLLOW Performed at [facility], [address] [place], [place] [address]    Report Status PENDING  Incomplete  Urine Culture     Status: None   Collection Time: 03/01/22 12:47 AM   Specimen: In/Out Cath Urine  Result Value Ref Range Status   Specimen Description IN/OUT CATH URINE  Final   Special Requests NONE  Final   Culture   Final    NO GROWTH Performed at [facility], [address] [place], [place] [address]    Report Status 03/02/2022 FINAL  Final    Antimicrobials: Anti-infectives (From admission, onward)    Start     Dose/Rate Route Frequency Ordered Stop   03/01/22 0800  meropenem (MERREM) 1 g in sodium chloride 0.9 % 100 mL IVPB        1 g 200 mL/hr over 30 Minutes Intravenous Every 8 hours  03/01/22 0733     02/28/22 2300  piperacillin-tazobactam (ZOSYN) IVPB 3.375 g  Status:  Discontinued        3.375 g 12.5 mL/hr over 240 Minutes Intravenous Every 8 hours 02/28/22 1747 03/01/22 0733   02/28/22 1500  piperacillin-tazobactam (ZOSYN) IVPB 3.375 g        3.375 g 12.5 mL/hr over 240 Minutes Intravenous  Once 02/28/22 1449 02/28/22 1816      Culture/Microbiology    Component Value Date/Time   SDES IN/OUT CATH URINE 03/01/2022 0047   SPECREQUEST NONE 03/01/2022 0047   CULT  03/01/2022 0047    NO GROWTH Performed at [facility], [address], [place], [place] [address]    REPTSTATUS 03/02/2022 FINAL 03/01/2022 0047   Radiology Studies: DG Chest 2 View  Result Date: 02/28/2022 CLINICAL DATA:  Suspected sepsis, fever EXAM: CHEST - 2 VIEW COMPARISON:  07/05/2020 FINDINGS: Transverse diameter of heart is increased. There is poor inspiration. There are no signs of pulmonary edema or focal pulmonary consolidation. There is no pleural effusion or pneumothorax. There is evidence of surgical fusion in the lower thoracic and possibly lumbar spine. IMPRESSION: Poor inspiration. There are no signs of pulmonary edema or focal pulmonary consolidation. Electronically Signed   By: Ernie Avena M.D.   On: 02/28/2022 14:55     LOS: 2 days   Lanae Boast, MD [facility]  03/02/2022, 10:25 AM

## 2022-03-03 ENCOUNTER — Telehealth: Payer: Self-pay | Admitting: Critical Care Medicine

## 2022-03-03 ENCOUNTER — Inpatient Hospital Stay (HOSPITAL_COMMUNITY): Payer: Medicaid Other

## 2022-03-03 DIAGNOSIS — D649 Anemia, unspecified: Secondary | ICD-10-CM

## 2022-03-03 LAB — CULTURE, BLOOD (ROUTINE X 2): Special Requests: ADEQUATE

## 2022-03-03 LAB — GLUCOSE, CAPILLARY
Glucose-Capillary: 124 mg/dL — ABNORMAL HIGH (ref 70–99)
Glucose-Capillary: 168 mg/dL — ABNORMAL HIGH (ref 70–99)
Glucose-Capillary: 174 mg/dL — ABNORMAL HIGH (ref 70–99)
Glucose-Capillary: 169 mg/dL — ABNORMAL HIGH (ref 70–99)

## 2022-03-03 MED ORDER — IOHEXOL 300 MG/ML  SOLN
100.0000 mL | Freq: Once | INTRAMUSCULAR | Status: AC | PRN
  Administered 2022-03-03: 100 mL via INTRAVENOUS

## 2022-03-03 MED ORDER — IOHEXOL 9 MG/ML PO SOLN
ORAL | Status: AC
Start: 2022-03-03 — End: 2022-03-03
  Administered 2022-03-03: 500 mL
  Filled 2022-03-03: qty 500

## 2022-03-03 MED ORDER — IBUPROFEN 400 MG PO TABS
400.0000 mg | ORAL_TABLET | Freq: Four times a day (QID) | ORAL | Status: DC | PRN
  Administered 2022-03-03: 400 mg via ORAL
  Filled 2022-03-03: qty 1

## 2022-03-03 NOTE — Progress Notes (Signed)
   03/02/22 2112  Assess: MEWS Score  Temp (!) 103.2 F (39.6 C)  BP (!) 146/70  Pulse Rate (!) 110  Resp (!) 22  Level of Consciousness Alert  SpO2 97 %  O2 Device Room Air  Assess: MEWS Score  MEWS Temp 2  MEWS Systolic 0  MEWS Pulse 1  MEWS RR 1  MEWS LOC 0  MEWS Score 4  MEWS Score Color Red  Assess: if the MEWS score is Yellow or Red  Were vital signs taken at a resting state? Yes  Focused Assessment Change from prior assessment (see assessment flowsheet)  Early Detection of Sepsis Score *See Row Information* High  MEWS guidelines implemented *See Row Information* Yes  Treat  MEWS Interventions Administered scheduled meds/treatments;Escalated (See documentation below);Administered prn meds/treatments  Pain Scale 0-10  Pain Score 0  Take Vital Signs  Increase Vital Sign Frequency  Red: Q 1hr X 4 then Q 4hr X 4, if remains red, continue Q 4hrs  Escalate  MEWS: Escalate Red: discuss with charge nurse/RN and provider, consider discussing with RRT  Notify: Charge Nurse/RN  Name of Charge Nurse/RN Notified Cheryl, RN  Date Charge Nurse/RN Notified 03/02/22  Time Charge Nurse/RN Notified 2112  Notify: Provider  Provider Name/Title C. Margo Aye, MD  Date Provider Notified 03/02/22  Time Provider Notified 2133  Method of Notification Page  Notification Reason Change in status  Provider response No new orders  Date of Provider Response 03/02/22  Time of Provider Response 2133

## 2022-03-03 NOTE — Telephone Encounter (Signed)
Copied from CRM [id number]. Topic: General - Other >> Mar 03, 2022  2:36 PM John Meza wrote: Reason for CRM: The patient has called to share that they're current handicap placards have expired in March    The patient has been directed to contact their PCP and requested the completion of new paperwork for the patient to continue to have Meza parking sticker   Please contact further when possible

## 2022-03-03 NOTE — Consult Note (Addendum)
[facility]    Date of Admission:  02/28/2022     Total days of antibiotics                Reason for Consult: ESBL Bacteremia 2/2 UTI    Referring Provider: [facility] Primary Care Provider: [name], MD    Assessment: John Meza is a 61 y.o. male admitted from home with fevers/chills and concern over bladder infection. 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 Treated with 1 dose of zosyn then switched to meropenum after BCID returned with ESBL producing E coli for preferred agent. He has continued to fever and still feels poorly. Would recommend imaging to eval more complicated urinary tract infection and rule out kidney abscess.  Plan: FU A/P CT scan for more complicated UTI features Continue Meropenem  Continue contact precautions Will likely need to prepare for PICC line     Principal Problem:   UTI due to extended-spectrum beta lactamase (ESBL) producing Escherichia coli Active Problems:   HLD (hyperlipidemia)   Paraplegia (HCC)   Neurogenic bladder   Neurogenic bowel   Insulin dependent type 2 diabetes mellitus, controlled ([place])   Essential hypertension   Wheelchair dependence   Sepsis due to Escherichia coli (E. coli) (HCC)    atorvastatin  20 mg Oral Daily   docusate sodium  100 mg Oral BID   enoxaparin (LOVENOX) injection  40 mg Subcutaneous Q24H   hydrochlorothiazide  25 mg Oral Daily   insulin aspart  0-15 Units Subcutaneous TID WC   insulin aspart  0-5 Units Subcutaneous QHS   insulin glargine-yfgn  25 Units Subcutaneous QHS   irbesartan  300 mg Oral Daily   pantoprazole  40 mg Oral Daily   sodium chloride flush  3 mL Intravenous Q12H    HPI: John Meza is a 61 y.o. male admitted from home for concern over fevers.   PMHx T2DM (A1C 8.3%), HTN, paraplegia s/p fall > 10 yrs ago, UTI.   Came in reporting urinary tract infection concern. He has noticed  shaking chills that started on Friday 5/19. He does self-catheterize 3x daily with a re-useable catheter system. Does not have any sensation below umbilicus. Does not have any back/flank pain to his description but still having fevers/sweating and headaches. No nausea/vomiting.   He previously had a small right ischial wound that has been tended to by his wife. This has healed over completely.  Main complaint is sweating and headaches today. Has sensation up until his umbilicus.    Review of Systems: Review of Systems  Constitutional:  Positive for chills, diaphoresis and fever.  Respiratory: Negative.    Cardiovascular: Negative.   Gastrointestinal: Negative.   Genitourinary:        Paraplegic patient   Musculoskeletal: Negative.   Skin:  Negative for rash.  Neurological:  Positive for headaches.   Past Medical History:  Diagnosis Date   Cellulitis and abscess of buttock 10/2016   Diabetes mellitus    Hypertension    Paraplegia ([place]) 2013   fell from ladder   Pressure ulcer, buttock, right, unstageable ([place]) 12/30/2020   Sacral decubitus ulcer, stage IV ([place]) 11/03/2021   SCI (spinal cord injury)    Spine fracture 11/16/2011   T 11- T9-L1    Social History   Tobacco Use   Smoking status: Former    Packs/day: 1.00    Years: 5.00  Pack years: 5.00    Types: Cigarettes    Quit date: 10/13/2011    Years since quitting: 10.3   Smokeless tobacco: Never   Tobacco comments:    03-24-19 per pt he stopped 1 mo ago   Vaping Use   Vaping Use: Never used  Substance Use Topics   Alcohol use: Not Currently    Alcohol/week: 1.0 standard drink    Types: 1 Cans of beer per week   Drug use: Never    Family History  Problem Relation Age of Onset   Healthy Mother    Diabetes Father    Diabetes Sister    Allergies  Allergen Reactions   Macrobid [Nitrofurantoin] Itching and Rash    OBJECTIVE: Blood pressure 137/67, pulse (!) 103, temperature 99.5 F (37.5 C), temperature source  Oral, resp. rate 20, height 5\' 4"  (1.626 m), weight 87 kg, SpO2 96 %.  Physical Exam Constitutional:      Appearance: He is ill-appearing and diaphoretic.  HENT:     Mouth/Throat:     Mouth: Mucous membranes are moist.     Pharynx: Oropharynx is clear.  Eyes:     General: No scleral icterus.    Pupils: Pupils are equal, round, and reactive to light.  Cardiovascular:     Rate and Rhythm: Regular rhythm. Tachycardia present.     Heart sounds: No murmur heard. Pulmonary:     Effort: Pulmonary effort is normal.     Breath sounds: Normal breath sounds.  Abdominal:     General: Bowel sounds are normal. There is no distension.  Musculoskeletal:     Comments: Scared area overlying right ischium from healed ulcer  Skin:    General: Skin is warm.     Capillary Refill: Capillary refill takes less than 2 seconds.  Neurological:     Mental Status: He is alert and oriented to person, place, and time.    Lab Results Lab Results  Component Value Date   WBC 8.0 03/02/2022   HGB 9.6 (L) 03/02/2022   HCT 28.5 (L) 03/02/2022   MCV 85.3 03/02/2022   PLT 175 03/02/2022    Lab Results  Component Value Date   CREATININE 1.21 03/02/2022   BUN 13 03/02/2022   NA 132 (L) 03/02/2022   K 4.1 03/02/2022   CL 100 03/02/2022   CO2 23 03/02/2022    Lab Results  Component Value Date   ALT 20 02/28/2022   AST 13 (L) 02/28/2022   ALKPHOS 108 02/28/2022   BILITOT 1.0 02/28/2022     Microbiology: Recent Results (from the past 240 hour(s))  Culture, blood (Routine x 2)     Status: Abnormal   Collection Time: 02/28/22  2:07 PM   Specimen: BLOOD RIGHT FOREARM  Result Value Ref Range Status   Specimen Description BLOOD RIGHT FOREARM  Final   Special Requests   Final    BOTTLES DRAWN AEROBIC AND ANAEROBIC Blood Culture results may not be optimal due to an excessive volume of blood received in culture bottles   Culture  Setup Time   Final    GRAM NEGATIVE RODS IN BOTH AEROBIC AND ANAEROBIC  BOTTLES CRITICAL RESULT CALLED TO, READ BACK BY AND VERIFIED WITH: PHARMD A WELLBORN [id number] AT 81 AM BY CM Performed at [facility], [place] [address], [place], [place] [address]    Culture (A)  Final    ESCHERICHIA COLI Confirmed Extended Spectrum Beta-Lactamase Producer (ESBL).  In bloodstream infections from ESBL organisms,  carbapenems are preferred over piperacillin/tazobactam. They are shown to have a lower risk of mortality.    Report Status 03/03/2022 FINAL  Final   Organism ID, Bacteria ESCHERICHIA COLI  Final      Susceptibility   Escherichia coli - MIC*    AMPICILLIN >=32 RESISTANT Resistant     CEFAZOLIN >=64 RESISTANT Resistant     CEFEPIME 1 SENSITIVE Sensitive     CEFTAZIDIME RESISTANT Resistant     CEFTRIAXONE 32 RESISTANT Resistant     CIPROFLOXACIN >=4 RESISTANT Resistant     GENTAMICIN <=1 SENSITIVE Sensitive     IMIPENEM <=0.25 SENSITIVE Sensitive     TRIMETH/SULFA >=320 RESISTANT Resistant     AMPICILLIN/SULBACTAM >=32 RESISTANT Resistant     PIP/TAZO 16 SENSITIVE Sensitive     * ESCHERICHIA COLI  Blood Culture ID Panel (Reflexed)     Status: Abnormal   Collection Time: 02/28/22  2:07 PM  Result Value Ref Range Status   Enterococcus faecalis NOT DETECTED NOT DETECTED Final   Enterococcus Faecium NOT DETECTED NOT DETECTED Final   Listeria monocytogenes NOT DETECTED NOT DETECTED Final   Staphylococcus species NOT DETECTED NOT DETECTED Final   Staphylococcus aureus (BCID) NOT DETECTED NOT DETECTED Final   Staphylococcus epidermidis NOT DETECTED NOT DETECTED Final   Staphylococcus lugdunensis NOT DETECTED NOT DETECTED Final   Streptococcus species NOT DETECTED NOT DETECTED Final   Streptococcus agalactiae NOT DETECTED NOT DETECTED Final   Streptococcus pneumoniae NOT DETECTED NOT DETECTED Final   Streptococcus pyogenes NOT DETECTED NOT DETECTED Final   A.calcoaceticus-baumannii NOT DETECTED NOT DETECTED Final   Bacteroides fragilis NOT DETECTED NOT DETECTED  Final   Enterobacterales DETECTED (A) NOT DETECTED Final    Comment: Enterobacterales represent a large order of gram negative bacteria, not a single organism. CRITICAL RESULT CALLED TO, READ BACK BY AND VERIFIED WITH: PHARMD A WELLBORN [id number] AT 708 AM BY CM    Enterobacter cloacae complex NOT DETECTED NOT DETECTED Final   Escherichia coli DETECTED (A) NOT DETECTED Final    Comment: CRITICAL RESULT CALLED TO, READ BACK BY AND VERIFIED WITH: PHARMD A WELLBORN [id number] AT 708 AM BY CM    Klebsiella aerogenes NOT DETECTED NOT DETECTED Final   Klebsiella oxytoca NOT DETECTED NOT DETECTED Final   Klebsiella pneumoniae NOT DETECTED NOT DETECTED Final   Proteus species NOT DETECTED NOT DETECTED Final   Salmonella species NOT DETECTED NOT DETECTED Final   Serratia marcescens NOT DETECTED NOT DETECTED Final   Haemophilus influenzae NOT DETECTED NOT DETECTED Final   Neisseria meningitidis NOT DETECTED NOT DETECTED Final   Pseudomonas aeruginosa NOT DETECTED NOT DETECTED Final   Stenotrophomonas maltophilia NOT DETECTED NOT DETECTED Final   Candida albicans NOT DETECTED NOT DETECTED Final   Candida auris NOT DETECTED NOT DETECTED Final   Candida glabrata NOT DETECTED NOT DETECTED Final   Candida krusei NOT DETECTED NOT DETECTED Final   Candida parapsilosis NOT DETECTED NOT DETECTED Final   Candida tropicalis NOT DETECTED NOT DETECTED Final   Cryptococcus neoformans/gattii NOT DETECTED NOT DETECTED Final   CTX-M ESBL DETECTED (A) NOT DETECTED Final    Comment: CRITICAL RESULT CALLED TO, READ BACK BY AND VERIFIED WITH: PHARMD A WELLBORN [id number] AT 708 AM BY CM (NOTE) Extended spectrum beta-lactamase detected. Recommend a carbapenem as initial therapy.      Carbapenem resistance IMP NOT DETECTED NOT DETECTED Final   Carbapenem resistance KPC NOT DETECTED NOT DETECTED Final   Carbapenem resistance NDM NOT DETECTED NOT DETECTED Final  Carbapenem resist OXA 48 LIKE NOT DETECTED NOT DETECTED  Final   Carbapenem resistance VIM NOT DETECTED NOT DETECTED Final    Comment: Performed at [facility], [place] [address] [place], [place] [address]  Culture, blood (Routine x 2)     Status: Abnormal   Collection Time: 02/28/22  2:09 PM   Specimen: BLOOD  Result Value Ref Range Status   Specimen Description BLOOD BLOOD RIGHT FOREARM  Final   Special Requests   Final    BOTTLES DRAWN AEROBIC AND ANAEROBIC Blood Culture adequate volume   Culture  Setup Time   Final    GRAM NEGATIVE RODS ANAEROBIC BOTTLE ONLY CRITICAL VALUE NOTED.  VALUE IS CONSISTENT WITH PREVIOUSLY REPORTED AND CALLED VALUE.    Culture (A)  Final    ESCHERICHIA COLI SUSCEPTIBILITIES PERFORMED ON PREVIOUS CULTURE WITHIN THE LAST 5 DAYS. Performed at [facility], [place] [address], [place], [place] [address]    Report Status 03/03/2022 FINAL  Final  Urine Culture     Status: None   Collection Time: 03/01/22 12:47 AM   Specimen: In/Out Cath Urine  Result Value Ref Range Status   Specimen Description IN/OUT CATH URINE  Final   Special Requests NONE  Final   Culture   Final    NO GROWTH Performed at [facility], [place] [address], [place], [place] [address]    Report Status 03/02/2022 FINAL  Final    Janene Madeira, MSN, NP-C [facility] Pager: [phone]  03/03/2022 9:48 AM

## 2022-03-03 NOTE — Progress Notes (Signed)
PROGRESS NOTE John Meza  WUJ:[id number] DOB: [birth year] DOA: 02/28/2022 PCP: Storm Frisk, MD   Brief Narrative/Hospital Course:  61 y.o. male with history of DM; HTN; and paraplegia with buttocks/sacral pressure ulcers, who does self-catheterization and has no sensation of bowel or bladder function and wheelchair-bound presenting with recurrent UTI-presenting with chills and shaking that he started on last Friday with subjective fever at home.  In the ED febrile,initially went to UC -> ER.  Heart rate 113 fever 101.8, RR 18>23,but no leukocytosis, procalcitonin elevated 6.5.  EEG was abnormal with WBC 11-20, leukocytes UA small.  Placed on antibiotics and admitted.  Blood culture came back positive and CTX-M ESBL E. Coli     Subjective: Seen and examined this morning.  Resting comfortably wife is at the bedside. Used video interpretor Overnight febrile up to 103.    Assessment and Plan: Principal Problem:   UTI due to extended-spectrum beta lactamase (ESBL) producing Escherichia coli Active Problems:   HLD (hyperlipidemia)   Paraplegia (HCC)   Neurogenic bladder   Neurogenic bowel   Insulin dependent type 2 diabetes mellitus, controlled (HCC)   Hyponatremia   Essential hypertension   Wheelchair dependence   Sepsis due to Escherichia coli (E. coli) (HCC)   Normocytic anemia   Sepsis POA on admission( with HR 113 fever 101.54m respiration rate 18>23) ESBL E Coli sepsis/bacteremia and likely due to UTI.   Febrile on admission and has not had a fever overnight.  Send repeat blood culture, urine culture no growth.  Continue IV meropenem.  Discussed with ID and consulted, getting CT scan of the abdomen.  Monitor fever curve.   Recent Labs  Lab 02/28/22 1409 02/28/22 1836 03/02/22 1559  WBC 9.8  --  8.0  LATICACIDVEN 1.2 1.7  --   PROCALCITON  --  6.59  --      HLD on Lipitor  Hyponatremia mild monitor.  Anemia ?  Etiology - check anemia  panel.  Paraplegia Neurogenic bladder Neurogenic bowel Wheelchair-bound: Cont supportive care, patient does intermittent self-catheterization at home, likely contributing to his UTI and sepsis. is advised to fu with his urology and advised to maintain hygiene with catheter.  Insulin dependent type 2 diabetes mellitus, with uncontrolled hyperglycemia 233 on admission : sugar stable, cont on ssi , Hba1c 8.3.   Recent Labs  Lab 03/02/22 1141 03/02/22 1652 03/02/22 2040 03/03/22 0718 03/03/22 1119  GLUCAP 163* 172* 254* 124* 168*    Essential hypertension: BP stable, cont Valsartan/HCTZ  Class I Obesity:Body mass index is 32.92 kg/m. : Will benefit with PCP follow-up, weight loss  healthy lifestyle and outpatient sleep evaluation.   DVT prophylaxis: enoxaparin (LOVENOX) injection 40 mg Start: 02/28/22 2000 Code Status:   Code Status: Full Code Family Communication: plan of care discussed with patient at bedside. Patient status is: Inpatient because of ongoing management of sepsis. Level of care: Telemetry Medical   Dispo: The patient is from: Home            Anticipated disposition: Home after completion of IV antibiotics  Mobility Assessment (last 72 hours)     Mobility Assessment     Row Name 03/02/22 2200 03/02/22 0830 03/01/22 2200 03/01/22 0819 02/28/22 1931   Does patient have an order for bedrest or is patient medically unstable No - Continue assessment No - Continue assessment No - Continue assessment No - Continue assessment No - Continue assessment   What is the highest level of mobility based on the progressive mobility  assessment? Level 2 (Chairfast) - Balance while sitting on edge of bed and cannot stand Level 2 (Chairfast) - Balance while sitting on edge of bed and cannot stand Level 2 (Chairfast) - Balance while sitting on edge of bed and cannot stand Level 2 (Chairfast) - Balance while sitting on edge of bed and cannot stand Level 2 (Chairfast) - Balance while  sitting on edge of bed and cannot stand   Is the above level different from baseline mobility prior to current illness? No - Consider discontinuing PT/OT No - Consider discontinuing PT/OT No - Consider discontinuing PT/OT No - Consider discontinuing PT/OT No - Consider discontinuing PT/OT             Objective: Vitals last 24 hrs: Vitals:   03/02/22 2348 03/03/22 0102 03/03/22 0206 03/03/22 0445  BP: 139/73 (!) 151/78 (!) 142/70 137/67  Pulse: 82 97 (!) 103 (!) 103  Resp: Temp: 98.7 F (37.1 C) 99.3 F (37.4 C) (!) 100.4 F (38 C) 99.5 F (37.5 C)  TempSrc: Oral Oral Oral Oral  SpO2: 100% 100% 99% 96%  Weight:      Height:       Weight change:   Physical Examination: General exam: AA,older than stated age, weak appearing. HEENT:Oral mucosa moist, Ear/Nose WNL grossly, dentition normal. Respiratory system: bilaterally diminished,no use of accessory muscle Cardiovascular system: S1 & S2 +, No JVD,. Gastrointestinal system: Abdomen soft,NT,ND, BS+ Nervous System:Alert, awake, paraplegic.  Extremities: edema neg,distal peripheral pulses palpable.  Skin: No rashes,no icterus. MSK: Normal muscle bulk,tone, power   Medications reviewed:  Scheduled Meds:  atorvastatin  20 mg Oral Daily   docusate sodium  100 mg Oral BID   enoxaparin (LOVENOX) injection  40 mg Subcutaneous Q24H   hydrochlorothiazide  25 mg Oral Daily   insulin aspart  0-15 Units Subcutaneous TID WC   insulin aspart  0-5 Units Subcutaneous QHS   insulin glargine-yfgn  25 Units Subcutaneous QHS   irbesartan  300 mg Oral Daily   pantoprazole  40 mg Oral Daily   sodium chloride flush  3 mL Intravenous Q12H   Continuous Infusions:  meropenem (MERREM) IV 1 g (03/03/22 0451)   Diet Order             Diet Carb Modified Fluid consistency: Thin; Room service appropriate? Yes  Diet effective now                  Intake/Output Summary (Last 24 hours) at 03/03/2022 1138 Last data filed at  03/03/2022 0900 Gross per 24 hour  Intake 1440 ml  Output 300 ml  Net 1140 ml   Net IO Since Admission: 3,940.29 mL [03/03/22 1138]  Wt Readings from Last 3 Encounters:  02/28/22 87 kg  05/14/21 76.7 kg  12/17/17 76.7 kg     Unresulted Labs (From admission, onward)     Start     Ordered   03/03/22 0831  Culture, blood (Routine X 2) w Reflex to ID Panel  BLOOD CULTURE X 2,   R      03/03/22 0830          Data Reviewed: I have personally reviewed following labs and imaging studies CBC: Recent Labs  Lab 02/28/22 1409 03/02/22 1559  WBC 9.8 8.0  NEUTROABS 8.1* 5.4  HGB 10.5* 9.6*  HCT 31.8* 28.5*  MCV 86.4 85.3  PLT 149* 175   Basic Metabolic Panel: Recent Labs  Lab 02/28/22 1409 03/02/22 1559  NA 130* 132*  K 4.2 4.1  CL 101 100  CO2 21* 23  GLUCOSE 188* 182*  BUN 22* 13  CREATININE 1.25* 1.21  CALCIUM 8.5* 8.2*   GFR: Estimated Creatinine Clearance: 66.2 mL/min (by C-G formula based on SCr of 1.21 mg/dL). Liver Function Tests: Recent Labs  Lab 02/28/22 1409  AST 13*  ALT 20  ALKPHOS 108  BILITOT 1.0  PROT 6.5  ALBUMIN 2.8*   No results for input(s): LIPASE, AMYLASE in the last 168 hours. No results for input(s): AMMONIA in the last 168 hours. Coagulation Profile: Recent Labs  Lab 02/28/22 1409  INR 1.0   BNP (last 3 results) No results for input(s): PROBNP in the last 8760 hours. HbA1C: No results for input(s): HGBA1C in the last 72 hours. CBG: Recent Labs  Lab 03/02/22 1141 03/02/22 1652 03/02/22 2040 03/03/22 0718 03/03/22 1119  GLUCAP 163* 172* 254* 124* 168*   Lipid Profile: No results for input(s): CHOL, HDL, LDLCALC, TRIG, CHOLHDL, LDLDIRECT in the last 72 hours. Thyroid Function Tests: No results for input(s): TSH, T4TOTAL, FREET4, T3FREE, THYROIDAB in the last 72 hours. Sepsis Labs: Recent Labs  Lab 02/28/22 1409 02/28/22 1836  PROCALCITON  --  6.59  LATICACIDVEN 1.2 1.7    Recent Results (from the past 240  hour(s))  Culture, blood (Routine x 2)     Status: Abnormal   Collection Time: 02/28/22  2:07 PM   Specimen: BLOOD RIGHT FOREARM  Result Value Ref Range Status   Specimen Description BLOOD RIGHT FOREARM  Final   Special Requests   Final    BOTTLES DRAWN AEROBIC AND ANAEROBIC Blood Culture results may not be optimal due to an excessive volume of blood received in culture bottles   Culture  Setup Time   Final    GRAM NEGATIVE RODS IN BOTH AEROBIC AND ANAEROBIC BOTTLES CRITICAL RESULT CALLED TO, READ BACK BY AND VERIFIED WITH: PHARMD A WELLBORN [phone] AT 708 AM BY CM Performed at [facility], [address] [place], [place] [address]    Culture (A)  Final    ESCHERICHIA COLI Confirmed Extended Spectrum Beta-Lactamase Producer (ESBL).  In bloodstream infections from ESBL organisms, carbapenems are preferred over piperacillin/tazobactam. They are shown to have a lower risk of mortality.    Report Status 03/03/2022 FINAL  Final   Organism ID, Bacteria ESCHERICHIA COLI  Final      Susceptibility   Escherichia coli - MIC*    AMPICILLIN >=32 RESISTANT Resistant     CEFAZOLIN >=64 RESISTANT Resistant     CEFEPIME 1 SENSITIVE Sensitive     CEFTAZIDIME RESISTANT Resistant     CEFTRIAXONE 32 RESISTANT Resistant     CIPROFLOXACIN >=4 RESISTANT Resistant     GENTAMICIN <=1 SENSITIVE Sensitive     IMIPENEM <=0.25 SENSITIVE Sensitive     TRIMETH/SULFA >=320 RESISTANT Resistant     AMPICILLIN/SULBACTAM >=32 RESISTANT Resistant     PIP/TAZO 16 SENSITIVE Sensitive     * ESCHERICHIA COLI  Blood Culture ID Panel (Reflexed)     Status: Abnormal   Collection Time: 02/28/22  2:07 PM  Result Value Ref Range Status   Enterococcus faecalis NOT DETECTED NOT DETECTED Final   Enterococcus Faecium NOT DETECTED NOT DETECTED Final   Listeria monocytogenes NOT DETECTED NOT DETECTED Final   Staphylococcus species NOT DETECTED NOT DETECTED Final   Staphylococcus aureus (BCID) NOT DETECTED NOT DETECTED  Final   Staphylococcus epidermidis NOT DETECTED NOT DETECTED Final   Staphylococcus  lugdunensis NOT DETECTED NOT DETECTED Final   Streptococcus species NOT DETECTED NOT DETECTED Final   Streptococcus agalactiae NOT DETECTED NOT DETECTED Final   Streptococcus pneumoniae NOT DETECTED NOT DETECTED Final   Streptococcus pyogenes NOT DETECTED NOT DETECTED Final   A.calcoaceticus-baumannii NOT DETECTED NOT DETECTED Final   Bacteroides fragilis NOT DETECTED NOT DETECTED Final   Enterobacterales DETECTED (A) NOT DETECTED Final    Comment: Enterobacterales represent a large order of gram negative bacteria, not a single organism. CRITICAL RESULT CALLED TO, READ BACK BY AND VERIFIED WITH: PHARMD A WELLBORN [id number] AT 708 AM BY CM    Enterobacter cloacae complex NOT DETECTED NOT DETECTED Final   Escherichia coli DETECTED (A) NOT DETECTED Final    Comment: CRITICAL RESULT CALLED TO, READ BACK BY AND VERIFIED WITH: PHARMD A WELLBORN [id number] AT 708 AM BY CM    Klebsiella aerogenes NOT DETECTED NOT DETECTED Final   Klebsiella oxytoca NOT DETECTED NOT DETECTED Final   Klebsiella pneumoniae NOT DETECTED NOT DETECTED Final   Proteus species NOT DETECTED NOT DETECTED Final   Salmonella species NOT DETECTED NOT DETECTED Final   Serratia marcescens NOT DETECTED NOT DETECTED Final   Haemophilus influenzae NOT DETECTED NOT DETECTED Final   Neisseria meningitidis NOT DETECTED NOT DETECTED Final   Pseudomonas aeruginosa NOT DETECTED NOT DETECTED Final   Stenotrophomonas maltophilia NOT DETECTED NOT DETECTED Final   Candida albicans NOT DETECTED NOT DETECTED Final   Candida auris NOT DETECTED NOT DETECTED Final   Candida glabrata NOT DETECTED NOT DETECTED Final   Candida krusei NOT DETECTED NOT DETECTED Final   Candida parapsilosis NOT DETECTED NOT DETECTED Final   Candida tropicalis NOT DETECTED NOT DETECTED Final   Cryptococcus neoformans/gattii NOT DETECTED NOT DETECTED Final   CTX-M ESBL DETECTED (A) NOT  DETECTED Final    Comment: CRITICAL RESULT CALLED TO, READ BACK BY AND VERIFIED WITH: PHARMD A WELLBORN [id number] AT 708 AM BY CM (NOTE) Extended spectrum beta-lactamase detected. Recommend a carbapenem as initial therapy.      Carbapenem resistance IMP NOT DETECTED NOT DETECTED Final   Carbapenem resistance KPC NOT DETECTED NOT DETECTED Final   Carbapenem resistance NDM NOT DETECTED NOT DETECTED Final   Carbapenem resist OXA 48 LIKE NOT DETECTED NOT DETECTED Final   Carbapenem resistance VIM NOT DETECTED NOT DETECTED Final    Comment: Performed at [facility], [address] [place], [place] [address]  Culture, blood (Routine x 2)     Status: Abnormal   Collection Time: 02/28/22  2:09 PM   Specimen: BLOOD  Result Value Ref Range Status   Specimen Description BLOOD BLOOD RIGHT FOREARM  Final   Special Requests   Final    BOTTLES DRAWN AEROBIC AND ANAEROBIC Blood Culture adequate volume   Culture  Setup Time   Final    GRAM NEGATIVE RODS ANAEROBIC BOTTLE ONLY CRITICAL VALUE NOTED.  VALUE IS CONSISTENT WITH PREVIOUSLY REPORTED AND CALLED VALUE.    Culture (A)  Final    ESCHERICHIA COLI SUSCEPTIBILITIES PERFORMED ON PREVIOUS CULTURE WITHIN THE LAST 5 DAYS. Performed at [facility], [address] [place] [address] [place], [place] [address]    Report Status 03/03/2022 FINAL  Final  Urine Culture     Status: None   Collection Time: 03/01/22 12:47 AM   Specimen: In/Out Cath Urine  Result Value Ref Range Status   Specimen Description IN/OUT CATH URINE  Final   Special Requests NONE  Final   Culture   Final  NO GROWTH Performed at [facility], [address] [place], [place] [address]    Report Status 03/02/2022 FINAL  Final    Antimicrobials: Anti-infectives (From admission, onward)    Start     Dose/Rate Route Frequency Ordered Stop   03/01/22 0800  meropenem (MERREM) 1 g in sodium chloride 0.9 % 100 mL IVPB        1 g 200 mL/hr over 30 Minutes Intravenous Every 8  hours 03/01/22 0733     02/28/22 2300  piperacillin-tazobactam (ZOSYN) IVPB 3.375 g  Status:  Discontinued        3.375 g 12.5 mL/hr over 240 Minutes Intravenous Every 8 hours 02/28/22 1747 03/01/22 0733   02/28/22 1500  piperacillin-tazobactam (ZOSYN) IVPB 3.375 g        3.375 g 12.5 mL/hr over 240 Minutes Intravenous  Once 02/28/22 1449 02/28/22 1816      Culture/Microbiology    Component Value Date/Time   SDES IN/OUT CATH URINE 03/01/2022 0047   SPECREQUEST NONE 03/01/2022 0047   CULT  03/01/2022 0047    NO GROWTH Performed at [facility], [address], [place], [place] [address]    REPTSTATUS 03/02/2022 FINAL 03/01/2022 0047   Radiology Studies: No results found.   LOS: 3 days   Lanae Boast, MD [facility]  03/03/2022, 11:38 AM

## 2022-03-04 ENCOUNTER — Inpatient Hospital Stay: Payer: Self-pay

## 2022-03-04 LAB — IRON AND TIBC
Iron: 17 ug/dL — ABNORMAL LOW (ref 45–182)
Saturation Ratios: 11 % — ABNORMAL LOW (ref 17.9–39.5)
TIBC: 157 ug/dL — ABNORMAL LOW (ref 250–450)
UIBC: 140 ug/dL

## 2022-03-04 LAB — RETICULOCYTES
Immature Retic Fract: 12.1 % (ref 2.3–15.9)
RBC.: 3.27 MIL/uL — ABNORMAL LOW (ref 4.22–5.81)
Retic Count, Absolute: 28.1 10*3/uL (ref 19.0–186.0)
Retic Ct Pct: 0.9 % (ref 0.4–3.1)

## 2022-03-04 LAB — GLUCOSE, CAPILLARY
Glucose-Capillary: 114 mg/dL — ABNORMAL HIGH (ref 70–99)
Glucose-Capillary: 140 mg/dL — ABNORMAL HIGH (ref 70–99)
Glucose-Capillary: 191 mg/dL — ABNORMAL HIGH (ref 70–99)
Glucose-Capillary: 240 mg/dL — ABNORMAL HIGH (ref 70–99)

## 2022-03-04 LAB — VITAMIN B12: Vitamin B-12: 532 pg/mL (ref 180–914)

## 2022-03-04 LAB — FERRITIN: Ferritin: 239 ng/mL (ref 24–336)

## 2022-03-04 LAB — FOLATE: Folate: 12.7 ng/mL (ref >5.9)

## 2022-03-04 MED ORDER — ERTAPENEM IV (FOR PTA / DISCHARGE USE ONLY): 1.0000 g | INTRAVENOUS | 0 refills | Status: AC

## 2022-03-04 MED ORDER — SODIUM CHLORIDE 0.9 % IV SOLN
1.0000 g | INTRAVENOUS | Status: DC
  Administered 2022-03-05: 1000 mg via INTRAVENOUS
  Filled 2022-03-04 (×2): qty 1

## 2022-03-04 NOTE — Progress Notes (Signed)
Pharmacy Antibiotic Note  John Meza is a 61 y.o. male for which pharmacy has been consulted for meropenem dosing for UTI/Bacteremia  Patient with a history of self-cath, DM; HTN; and paraplegia with buttocks/sacral pressure ulcers. Patient presenting with fever and tachycardia.  Grew ESBL in 2021 no recent cultures was sensitive to zosyn at that time. Blood cultures now growing CTX-M E. Coli in 2/4 bottles.  SCr 1.21 - above baseline  Plan: Continue Meropenem 1g Q8h while inpatient Per Id change to ertapenem for o/p until 6/4 OPAT orders entered  Trend WBC, fever, renal function  Height: 5\' 4"  (162.6 cm) Weight: 87 kg (191 lb 12.8 oz) IBW/kg (Calculated) : 59.2  Temp (24hrs), Avg:98.9 F (37.2 C), Min:97.6 F (36.4 C), Max:101 F (38.3 C)  Recent Labs  Lab 02/28/22 1409 02/28/22 1836 03/02/22 1559  WBC 9.8  --  8.0  CREATININE 1.25*  --  1.21  LATICACIDVEN 1.2 1.7  --      Estimated Creatinine Clearance: 66.2 mL/min (by C-G formula based on SCr of 1.21 mg/dL).    Allergies  Allergen Reactions   Macrobid [Nitrofurantoin] Itching and Rash    Antimicrobials this admission: zosyn 5/20 > 5/21 Meropenem 5/21>> (6/4)  Microbiology results: 5/20 blood culture: E. coli (CTX-M positive)   Dollene Mallery A. 6/20, PharmD, BCPS, FNKF Clinical Pharmacist [place] Please utilize Amion for appropriate phone number to reach the unit pharmacist [facility])

## 2022-03-04 NOTE — Progress Notes (Signed)
       [facility]  Date of Admission:  02/28/2022   Total days of inpatient antibiotics 4  Principal Problem:   UTI due to extended-spectrum beta lactamase (ESBL) producing Escherichia coli Active Problems:   HLD (hyperlipidemia)   Paraplegia (HCC)   Neurogenic bladder   Neurogenic bowel   Insulin dependent type 2 diabetes mellitus, controlled (HCC)   Hyponatremia   Essential hypertension   Wheelchair dependence   Sepsis due to Escherichia coli (E. coli) (HCC)   Normocytic anemia          Assessment: 60 YO spanish speaking male with paraplegia due to spinal cord injury complicated by neurogenic bladder with Hx of Ecoli UTI admitted with ESBL Ecoli bacteremia.  #ESBL E coli bacteremia 2/2 B/L pyelonephritis #Neurogenic bladder with self cath tid #Fever-fever curve trending down -Blood Cx+ ESBL Ecoli, Urine Cx with no growth(but obtained after 2 doses of pip-tazo)->UA negative nitrites and large leukocytes(5/20). -Pt transitioned to meropenem and continued to fever, ID engaged. -CT ordered which showed b/l pyelonephritis. Will treat with 2 weeks of IV antibiotics for complicated UTI Recommendations: -Continue meropenem->transition to ertapenem on discharge to complete 2 weeks of antibiotics(EOT 6/3)  OPAT ORDERS:  Diagnosis:ESBL Ecoli bacteremia 2/2 pyelonephritis  Culture Result: ESBL Ecoli  Allergies  Allergen Reactions   Macrobid [Nitrofurantoin] Itching and Rash     Discharge antibiotics to be given via PICC line:  Per pharmacy protocol ertapenem    Duration: 2 weeks End Date: 6/3  PIC Care Per Protocol with [employer] Use: Home health RN for IV administration and teaching, line care and labs.    Labs weekly while on IV antibiotics: __ CBC with differential __ CMP __ CRP __ ESR  __ Please pull PIC at completion of IV antibiotics  Fax weekly labs to [phone]  Clinic Follow Up Appt: 6/5  @ [facility] with Dr.  Manandhar   Microbiology:   Antibiotics: Pip-tazo 5/20 Meropenem 5/21-p Cultures: Blood 5/20 2/2 ESBL Ecoli 5/23 pending Urine 5/21 NG    SUBJECTIVE: Resting in bed. No new complaints.  Interval: Tmax of 101 overnight  Review of Systems: Review of Systems  All other systems reviewed and are negative.   Scheduled Meds:  atorvastatin  20 mg Oral Daily   docusate sodium  100 mg Oral BID   enoxaparin (LOVENOX) injection  40 mg Subcutaneous Q24H   hydrochlorothiazide  25 mg Oral Daily   insulin aspart  0-15 Units Subcutaneous TID WC   insulin aspart  0-5 Units Subcutaneous QHS   insulin glargine-yfgn  25 Units Subcutaneous QHS   irbesartan  300 mg Oral Daily   pantoprazole  40 mg Oral Daily   sodium chloride flush  3 mL Intravenous Q12H   Continuous Infusions:  [START ON 03/05/2022] ertapenem     meropenem (MERREM) IV 1 g (03/04/22 1300)   PRN Meds:.acetaminophen **OR** acetaminophen, acetaminophen, bisacodyl, hydrALAZINE, ibuprofen, methocarbamol, ondansetron **OR** ondansetron (ZOFRAN) IV, oxyCODONE, polyethylene glycol Allergies  Allergen Reactions   Macrobid [Nitrofurantoin] Itching and Rash    OBJECTIVE: Vitals:   03/03/22 1640 03/03/22 2122 03/04/22 0451 03/04/22 0858  BP: (!) 154/72 (!) 116/54 134/68 (!) 121/54  Pulse: 96 72 74 79  Resp: 20 18 16 17  Temp: (!) 101 F (38.3 C) 98.5 F (36.9 C) 97.6 F (36.4 C) 97.6 F (36.4 C)  TempSrc: Oral Oral Oral Oral  SpO2: 98% 100% 100% 100%  Weight:      Height:         Body mass index is 32.92 kg/m.  Physical Exam Constitutional:      General: He is not in acute distress.    Appearance: He is normal weight. He is not toxic-appearing.  HENT:     Head: Normocephalic and atraumatic.     Right Ear: External ear normal.     Left Ear: External ear normal.     Nose: No congestion or rhinorrhea.     Mouth/Throat:     Mouth: Mucous membranes are moist.     Pharynx: Oropharynx is clear.  Eyes:      Extraocular Movements: Extraocular movements intact.     Conjunctiva/sclera: Conjunctivae normal.     Pupils: Pupils are equal, round, and reactive to light.  Cardiovascular:     Rate and Rhythm: Normal rate and regular rhythm.     Heart sounds: No murmur heard.   No friction rub. No gallop.  Pulmonary:     Effort: Pulmonary effort is normal.     Breath sounds: Normal breath sounds.  Abdominal:     General: Abdomen is flat. Bowel sounds are normal.     Palpations: Abdomen is soft.  Musculoskeletal:        General: No swelling. Normal range of motion.     Cervical back: Normal range of motion and neck supple.  Skin:    General: Skin is warm and dry.  Neurological:     General: No focal deficit present.     Mental Status: He is oriented to person, place, and time.  Psychiatric:        Mood and Affect: Mood normal.      Lab Results Lab Results  Component Value Date   WBC 8.0 03/02/2022   HGB 9.6 (L) 03/02/2022   HCT 28.5 (L) 03/02/2022   MCV 85.3 03/02/2022   PLT 175 03/02/2022    Lab Results  Component Value Date   CREATININE 1.21 03/02/2022   BUN 13 03/02/2022   NA 132 (L) 03/02/2022   K 4.1 03/02/2022   CL 100 03/02/2022   CO2 23 03/02/2022    Lab Results  Component Value Date   ALT 20 02/28/2022   AST 13 (L) 02/28/2022   ALKPHOS 108 02/28/2022   BILITOT 1.0 02/28/2022        Laurice Record, [facility] 03/04/2022, 3:51 PM

## 2022-03-04 NOTE — Progress Notes (Signed)
Progress Note    John Meza   [id number]  DOB: 09-01-1961  DOA: 02/28/2022     4 PCP: John Stain, MD  Initial CC: fever, chills  Hospital Course: John Meza 61 y.o. male with history of DM, HTN, and paraplegia with buttocks/sacral pressure ulcers, neurogenic bladder dependent on self-catheterization who presented with chills and concern for recurrent UTI.  Work-up was notable for ESBL E. coli bacteremia.  He was followed by infectious disease during hospitalization.  He was treated with meropenem with plans for transitioning to ertapenem to complete course at discharge.  Interval History:  No events overnight.  Daughter present bedside and able to translate easily.  Patient understands plan is for PICC line placement then probable discharge by tomorrow to complete antibiotic course outpatient.  Assessment and Plan:  Sepsis POA 2/2 ESBL E Coli bacteremia due to UTI -Febrile, tachycardia, tachypnea -Treated with meropenem during hospitalization.  ID following as well, appreciate assistance - Plan for PICC line placement today or tomorrow followed by discharge on ertapenem to complete course  HLD  -Continue Lipitor   Hyponatremia -Stable  Iron deficiency anemia  -Iron stores low - Can initiate oral iron after antibiotic completion   Paraplegia Neurogenic bladder Neurogenic bowel Wheelchair-bound: Cont supportive care, patient does intermittent self-catheterization at home, likely contributing to his UTI and sepsis. is advised to fu with his urology and advised to maintain hygiene with catheter.   Insulin dependent type 2 diabetes mellitus, with uncontrolled hyperglycemia  -A1c 8.3% on admission - Continue SSI and CBG monitoring  Essential hypertension: BP stable, cont Valsartan/HCTZ   Class I Obesity:Body mass index is 32.92 kg/m. : Will benefit with PCP follow-up, weight loss  healthy lifestyle and outpatient sleep evaluation  Old records  reviewed in assessment of this patient  Antimicrobials: Meropenem 03/01/2022 >> current  DVT prophylaxis:  enoxaparin (LOVENOX) injection 40 mg Start: 02/28/22 2000   Code Status:   Code Status: Full Code  Disposition Plan:  Home Thus Status is: Inpt  Objective: Blood pressure (!) 121/54, pulse 79, temperature 97.6 F (36.4 C), temperature source Oral, resp. rate 17, height 5\' 4"  (1.626 m), weight 87 kg, SpO2 100 %.  Examination:  Physical Exam Constitutional:      General: He is not in acute distress.    Appearance: Normal appearance.  HENT:     Head: Normocephalic and atraumatic.     Mouth/Throat:     Mouth: Mucous membranes are moist.  Eyes:     Extraocular Movements: Extraocular movements intact.  Cardiovascular:     Rate and Rhythm: Normal rate and regular rhythm.     Heart sounds: Normal heart sounds.  Pulmonary:     Effort: Pulmonary effort is normal. No respiratory distress.     Breath sounds: Normal breath sounds. No wheezing.  Abdominal:     General: Bowel sounds are normal. There is no distension.     Palpations: Abdomen is soft.     Tenderness: There is no abdominal tenderness.  Musculoskeletal:        General: No swelling.     Cervical back: Normal range of motion and neck supple.  Skin:    General: Skin is warm and dry.  Neurological:     Mental Status: He is alert.     Comments: B/L LE paraplegia appreciated  Psychiatric:        Mood and Affect: Mood normal.        Behavior: Behavior normal.     Consultants:  ID  Procedures:    Data Reviewed: Results for orders placed or performed during the hospital encounter of 02/28/22 (from the past 24 hour(s))  Glucose, capillary     Status: Abnormal   Collection Time: 03/03/22  4:37 PM  Result Value Ref Range   Glucose-Capillary 174 (H) 70 - 99 mg/dL  Glucose, capillary     Status: Abnormal   Collection Time: 03/03/22  9:21 PM  Result Value Ref Range   Glucose-Capillary 169 (H) 70 - 99 mg/dL   Vitamin B12     Status: None   Collection Time: 03/04/22  6:30 AM  Result Value Ref Range   Vitamin B-12 532 180 - 914 pg/mL  Folate     Status: None   Collection Time: 03/04/22  6:30 AM  Result Value Ref Range   Folate 12.7 >5.9 ng/mL  Iron and TIBC     Status: Abnormal   Collection Time: 03/04/22  6:30 AM  Result Value Ref Range   Iron 17 (L) 45 - 182 ug/dL   TIBC 157 (L) 250 - 450 ug/dL   Saturation Ratios 11 (L) 17.9 - 39.5 %   UIBC 140 ug/dL  Ferritin     Status: None   Collection Time: 03/04/22  6:30 AM  Result Value Ref Range   Ferritin 239 24 - 336 ng/mL  Reticulocytes     Status: Abnormal   Collection Time: 03/04/22  6:30 AM  Result Value Ref Range   Retic Ct Pct 0.9 0.4 - 3.1 %   RBC. 3.27 (L) 4.22 - 5.81 MIL/uL   Retic Count, Absolute 28.1 19.0 - 186.0 K/uL   Immature Retic Fract 12.1 2.3 - 15.9 %  Glucose, capillary     Status: Abnormal   Collection Time: 03/04/22  7:13 AM  Result Value Ref Range   Glucose-Capillary 114 (H) 70 - 99 mg/dL  Glucose, capillary     Status: Abnormal   Collection Time: 03/04/22 11:23 AM  Result Value Ref Range   Glucose-Capillary 140 (H) 70 - 99 mg/dL    I have Reviewed nursing notes, Vitals, and Lab results since pt's last encounter. Pertinent lab results : see above I have ordered test including BMP, CBC, Mg I have reviewed the last note from staff over past 24 hours I have discussed pt's care plan and test results with nursing staff, case manager   LOS: 4 days   John Dee, MD [facility] 03/04/2022, 1:23 PM

## 2022-03-04 NOTE — Progress Notes (Signed)
PHARMACY CONSULT NOTE FOR:  OUTPATIENT  PARENTERAL ANTIBIOTIC THERAPY (OPAT)  Indication: pyelonephritis Regimen: Ertapenem 1g IV q24 h End date: 03/15/2022  IV antibiotic discharge orders are pended. To discharging provider:  please sign these orders via discharge navigator,  Select New Orders & click on the button choice - Manage This Unsigned Work.     Thank you for involving pharmacy in this patient's care.  Arnette Felts, PharmD PGY1 Ambulatory Care Pharmacy Resident 03/04/2022 8:44 AM  **Pharmacist phone directory can be found on [email or web address] listed under [facility]**

## 2022-03-04 NOTE — Progress Notes (Signed)
Spoke with wife. She changed her mind about taking care of her husband when he gets home. She is very apprehensive about it. The family decided that its best for him to get his antibiotic here in the hospital. Will make MD aware of family decision.

## 2022-03-05 LAB — GLUCOSE, CAPILLARY
Glucose-Capillary: 118 mg/dL — ABNORMAL HIGH (ref 70–99)
Glucose-Capillary: 193 mg/dL — ABNORMAL HIGH (ref 70–99)
Glucose-Capillary: 178 mg/dL — ABNORMAL HIGH (ref 70–99)

## 2022-03-05 MED ORDER — SODIUM CHLORIDE 0.9% FLUSH: 10.0000 mL | Freq: Two times a day (BID) | INTRAVENOUS | Status: DC

## 2022-03-05 MED ORDER — SODIUM CHLORIDE 0.9% FLUSH: 10.0000 mL | INTRAVENOUS | Status: DC | PRN

## 2022-03-05 NOTE — Discharge Summary (Signed)
Physician Discharge Summary   John Meza DIY:[id number] DOB: 07/19/1961 DOA: 02/28/2022  PCP: [name], MD  Admit date: 02/28/2022 Discharge date:  03/05/2022  Admitted From: Home Disposition:  Home Discharging physician: Dwyane Dee, MD  Recommendations for Outpatient Follow-up:  Continue ertapenem on discharge; course to complete on 6/3  Start on oral iron after abx completion   Home Health: RN Equipment/Devices:   Discharge Condition: stable CODE STATUS: Full Diet recommendation:  Diet Orders (From admission, onward)     Start     Ordered   03/05/22 0000  Diet Carb Modified        03/05/22 1334   02/28/22 1551  Diet Carb Modified Fluid consistency: Thin; Room service appropriate? Yes  Diet effective now       Question Answer Comment  Diet-HS Snack? Nothing   Calorie Level Medium 1600-2000   Fluid consistency: Thin   Room service appropriate? Yes      02/28/22 1551            Hospital Course: [name] 61 y.o. male with history of DM, HTN, and paraplegia with buttocks/sacral pressure ulcers, neurogenic bladder dependent on self-catheterization who presented with chills and concern for recurrent UTI.  Work-up was notable for ESBL E. coli bacteremia.  He was followed by infectious disease during hospitalization.  He was treated with meropenem with plans for transitioning to ertapenem to complete course at discharge.  Assessment and Plan:  Sepsis POA 2/2 ESBL E Coli bacteremia due to UTI -Febrile, tachycardia, tachypnea -Treated with meropenem during hospitalization.  ID following as well, appreciate assistance - Underwent midline placement on 5/25. HHRN arranged prior to d/c -Continue ertapenem at discharge to complete course on 03/14/2022   HLD  -Continue Lipitor   Hyponatremia -Stable   Iron deficiency anemia  -Iron stores low - Can initiate oral iron after antibiotic completion   Paraplegia Neurogenic bladder Neurogenic  bowel Wheelchair-bound: Cont supportive care, patient does intermittent self-catheterization at home, likely contributing to his UTI and sepsis. is advised to followup with urology and advised to maintain hygiene with catheter.   Insulin dependent type 2 diabetes mellitus, with uncontrolled hyperglycemia  -A1c 8.3% on admission - Continue SSI and CBG monitoring   Essential hypertension: BP stable, cont Valsartan/HCTZ   Class I Obesity:Body mass index is 32.92 kg/m. : Will benefit with PCP follow-up, weight loss  healthy lifestyle and outpatient sleep evaluation   The patient's chronic medical conditions were treated accordingly per the patient's home medication regimen except as noted.  On day of discharge, patient was felt deemed stable for discharge. Patient/family member advised to call PCP or come back to ER if needed.   Principal Diagnosis: UTI due to extended-spectrum beta lactamase (ESBL) producing Escherichia coli  Discharge Diagnoses: Principal Problem:   UTI due to extended-spectrum beta lactamase (ESBL) producing Escherichia coli Active Problems:   HLD (hyperlipidemia)   Paraplegia (HCC)   Neurogenic bladder   Neurogenic bowel   Insulin dependent type 2 diabetes mellitus, controlled (John Meza)   Hyponatremia   Essential hypertension   Wheelchair dependence   Sepsis due to Escherichia coli (E. coli) (HCC)   Normocytic anemia   Discharge Instructions     [facility] pharmacist to adjust dose for Vancomycin, Aminoglycosides and other anti-infective therapies as requested by physician.   Complete by: As directed    [facility] to provide Cath Flo 53m   Complete by: As directed    Administer for PICC line occlusion and  as ordered by physician for other access device issues.   Anaphylaxis Kit: Provided to treat any anaphylactic reaction to the medication being provided to the patient if First Dose or when requested by physician   Complete by: As  directed    Epinephrine 67m/ml vial / amp: Administer 0.358m(0.49m80msubcutaneously once for moderate to severe anaphylaxis, nurse to call physician and pharmacy when reaction occurs and call 911 if needed for immediate care   Diphenhydramine 10m62m IV vial: Administer 25-10mg34mIM PRN for first dose reaction, rash, itching, mild reaction, nurse to call physician and pharmacy when reaction occurs   Sodium Chloride 0.9% NS 500ml 49mAdminister if needed for hypovolemic blood pressure drop or as ordered by physician after call to physician with anaphylactic reaction   Change dressing on IV access line weekly and PRN   Complete by: As directed    Diet Carb Modified   Complete by: As directed    Flush IV access with Sodium Chloride 0.9% and Heparin 10 units/ml or 100 units/ml   Complete by: As directed    Home infusion instructions - [facility]   Complete by: As directed    Instructions: Flush IV access with Sodium Chloride 0.9% and Heparin 10units/ml or 100units/ml   Change dressing on IV access line: Weekly and PRN   Instructions Cath Flo 2mg: A87mnister for PICC Line occlusion and as ordered by physician for other access device   [facility] pharmacist to adjust dose for: Vancomycin, Aminoglycosides and other anti-infective therapies as requested by physician   Increase activity slowly   Complete by: As directed    Method of administration may be changed at the discretion of home infusion pharmacist based upon assessment of the patient and/or caregiver's ability to self-administer the medication ordered   Complete by: As directed    No wound care   Complete by: As directed       Allergies as of 03/05/2022       Reactions   Macrobid [nitrofurantoin] Itching, Rash        Medication List     TAKE these medications    atorvastatin 20 MG tablet Commonly known as: LIPITOR TAKE 1 TABLET (20 MG TOTAL) BY MOUTH DAILY. TO LOWER CHOLESTEROL What changed:  how  much to take how to take this when to take this   Basaglar KwikPen 100 UNIT/ML Inject 25 Units into the skin at bedtime.   ertapenem  IVPB Commonly known as: INVANZ Inject 1 g into the vein daily for 11 days. Indication:  acute complicated pyelonephritis First Dose: Yes Last Day of Therapy:  03/15/2022 Labs - Once weekly:  CBC/D and BMP, Labs - Every other week:  ESR and CRP Method of administration: Mini-Bag Plus / Gravity Method of administration may be changed at the discretion of home infusion pharmacist based upon assessment of the patient and/or caregiver's ability to self-administer the medication ordered.   metFORMIN 1000 MG tablet Commonly known as: GLUCOPHAGE TAKE 1 TABLET (1,000 MG TOTAL) BY MOUTH 2 (TWO) TIMES DAILY WITH A MEAL. What changed: how much to take   methocarbamol 500 MG tablet Commonly known as: ROBAXIN Take 1 tablet (500 mg total) by mouth every 6 (six) hours as needed for muscle spasms.   omeprazole 20 MG capsule Commonly known as: PRILOSEC Take 1 capsule by mouth daily to reduce stomach acid What changed:  when to take this reasons to take this   sitaGLIPtin 100 MG tablet Commonly known as:  Januvia Take 1 tablet by mouth daily What changed:  how much to take how to take this when to take this additional instructions   TechLite Pen Needles 32G X 4 MM Misc Generic drug: Insulin Pen Needle Use as directed to inject insulin   valsartan-hydrochlorothiazide 320-25 MG tablet Commonly known as: DIOVAN-HCT Tome 1 tableta por va oral diariamente. (Take 1 tablet by mouth daily.) What changed: when to take this               Discharge Care Instructions  (From admission, onward)           Start     Ordered   03/04/22 0000  Change dressing on IV access line weekly and PRN  (Home infusion instructions - [facility] )        03/04/22 1109            Allergies  Allergen Reactions   Macrobid [Nitrofurantoin] Itching and  Rash    Consultations: ID  Procedures:   Discharge Exam: BP 132/66 (BP Location: Right Arm)   Pulse 90   Temp 98.6 F (37 C) (Oral)   Resp 20   Ht '5\' 4"'  (1.626 m)   Wt 87 kg   SpO2 99%   BMI 32.92 kg/m  Physical Exam Constitutional:      General: He is not in acute distress.    Appearance: Normal appearance.  HENT:     Head: Normocephalic and atraumatic.     Mouth/Throat:     Mouth: Mucous membranes are moist.  Eyes:     Extraocular Movements: Extraocular movements intact.  Cardiovascular:     Rate and Rhythm: Normal rate and regular rhythm.     Heart sounds: Normal heart sounds.  Pulmonary:     Effort: Pulmonary effort is normal. No respiratory distress.     Breath sounds: Normal breath sounds. No wheezing.  Abdominal:     General: Bowel sounds are normal. There is no distension.     Palpations: Abdomen is soft.     Tenderness: There is no abdominal tenderness.  Musculoskeletal:        General: No swelling.     Cervical back: Normal range of motion and neck supple.  Skin:    General: Skin is warm and dry.  Neurological:     Mental Status: He is alert.     Comments: B/L LE paraplegia appreciated  Psychiatric:        Mood and Affect: Mood normal.        Behavior: Behavior normal.     The results of significant diagnostics from this hospitalization (including imaging, microbiology, ancillary and laboratory) are listed below for reference.   Microbiology: Recent Results (from the past 240 hour(s))  Culture, blood (Routine x 2)     Status: Abnormal   Collection Time: 02/28/22  2:07 PM   Specimen: BLOOD RIGHT FOREARM  Result Value Ref Range Status   Specimen Description BLOOD RIGHT FOREARM  Final   Special Requests   Final    BOTTLES DRAWN AEROBIC AND ANAEROBIC Blood Culture results may not be optimal due to an excessive volume of blood received in culture bottles   Culture  Setup Time   Final    GRAM NEGATIVE RODS IN BOTH AEROBIC AND ANAEROBIC  BOTTLES CRITICAL RESULT CALLED TO, READ BACK BY AND VERIFIED WITH: PHARMD A WELLBORN [id number] AT 58 AM BY CM Performed at [facility], [place] [address] [place], [place] [address]  Culture (A)  Final    ESCHERICHIA COLI Confirmed Extended Spectrum Beta-Lactamase Producer (ESBL).  In bloodstream infections from ESBL organisms, carbapenems are preferred over piperacillin/tazobactam. They are shown to have a lower risk of mortality.    Report Status 03/03/2022 FINAL  Final   Organism ID, Bacteria ESCHERICHIA COLI  Final      Susceptibility   Escherichia coli - MIC*    AMPICILLIN >=32 RESISTANT Resistant     CEFAZOLIN >=64 RESISTANT Resistant     CEFEPIME 1 SENSITIVE Sensitive     CEFTAZIDIME RESISTANT Resistant     CEFTRIAXONE 32 RESISTANT Resistant     CIPROFLOXACIN >=4 RESISTANT Resistant     GENTAMICIN <=1 SENSITIVE Sensitive     IMIPENEM <=0.25 SENSITIVE Sensitive     TRIMETH/SULFA >=320 RESISTANT Resistant     AMPICILLIN/SULBACTAM >=32 RESISTANT Resistant     PIP/TAZO 16 SENSITIVE Sensitive     * ESCHERICHIA COLI  Blood Culture ID Panel (Reflexed)     Status: Abnormal   Collection Time: 02/28/22  2:07 PM  Result Value Ref Range Status   Enterococcus faecalis NOT DETECTED NOT DETECTED Final   Enterococcus Faecium NOT DETECTED NOT DETECTED Final   Listeria monocytogenes NOT DETECTED NOT DETECTED Final   Staphylococcus species NOT DETECTED NOT DETECTED Final   Staphylococcus aureus (BCID) NOT DETECTED NOT DETECTED Final   Staphylococcus epidermidis NOT DETECTED NOT DETECTED Final   Staphylococcus lugdunensis NOT DETECTED NOT DETECTED Final   Streptococcus species NOT DETECTED NOT DETECTED Final   Streptococcus agalactiae NOT DETECTED NOT DETECTED Final   Streptococcus pneumoniae NOT DETECTED NOT DETECTED Final   Streptococcus pyogenes NOT DETECTED NOT DETECTED Final   A.calcoaceticus-baumannii NOT DETECTED NOT DETECTED Final   Bacteroides fragilis NOT DETECTED NOT DETECTED  Final   Enterobacterales DETECTED (A) NOT DETECTED Final    Comment: Enterobacterales represent a large order of gram negative bacteria, not a single organism. CRITICAL RESULT CALLED TO, READ BACK BY AND VERIFIED WITH: PHARMD A WELLBORN [id number] AT 98 AM BY CM    Enterobacter cloacae complex NOT DETECTED NOT DETECTED Final   Escherichia coli DETECTED (A) NOT DETECTED Final    Comment: CRITICAL RESULT CALLED TO, READ BACK BY AND VERIFIED WITH: PHARMD A WELLBORN [id number] AT 708 AM BY CM    Klebsiella aerogenes NOT DETECTED NOT DETECTED Final   Klebsiella oxytoca NOT DETECTED NOT DETECTED Final   Klebsiella pneumoniae NOT DETECTED NOT DETECTED Final   Proteus species NOT DETECTED NOT DETECTED Final   Salmonella species NOT DETECTED NOT DETECTED Final   Serratia marcescens NOT DETECTED NOT DETECTED Final   Haemophilus influenzae NOT DETECTED NOT DETECTED Final   Neisseria meningitidis NOT DETECTED NOT DETECTED Final   Pseudomonas aeruginosa NOT DETECTED NOT DETECTED Final   Stenotrophomonas maltophilia NOT DETECTED NOT DETECTED Final   Candida albicans NOT DETECTED NOT DETECTED Final   Candida auris NOT DETECTED NOT DETECTED Final   Candida glabrata NOT DETECTED NOT DETECTED Final   Candida krusei NOT DETECTED NOT DETECTED Final   Candida parapsilosis NOT DETECTED NOT DETECTED Final   Candida tropicalis NOT DETECTED NOT DETECTED Final   Cryptococcus neoformans/gattii NOT DETECTED NOT DETECTED Final   CTX-M ESBL DETECTED (A) NOT DETECTED Final    Comment: CRITICAL RESULT CALLED TO, READ BACK BY AND VERIFIED WITH: PHARMD A WELLBORN [id number] AT 708 AM BY CM (NOTE) Extended spectrum beta-lactamase detected. Recommend a carbapenem as initial therapy.      Carbapenem resistance IMP NOT DETECTED NOT DETECTED  Final   Carbapenem resistance KPC NOT DETECTED NOT DETECTED Final   Carbapenem resistance NDM NOT DETECTED NOT DETECTED Final   Carbapenem resist OXA 48 LIKE NOT DETECTED NOT DETECTED  Final   Carbapenem resistance VIM NOT DETECTED NOT DETECTED Final    Comment: Performed at [facility], [place] [address], [place], [place] [address]  Culture, blood (Routine x 2)     Status: Abnormal   Collection Time: 02/28/22  2:09 PM   Specimen: BLOOD  Result Value Ref Range Status   Specimen Description BLOOD BLOOD RIGHT FOREARM  Final   Special Requests   Final    BOTTLES DRAWN AEROBIC AND ANAEROBIC Blood Culture adequate volume   Culture  Setup Time   Final    GRAM NEGATIVE RODS ANAEROBIC BOTTLE ONLY CRITICAL VALUE NOTED.  VALUE IS CONSISTENT WITH PREVIOUSLY REPORTED AND CALLED VALUE.    Culture (A)  Final    ESCHERICHIA COLI SUSCEPTIBILITIES PERFORMED ON PREVIOUS CULTURE WITHIN THE LAST 5 DAYS. Performed at [facility], [place] [address], [place], [place] [address]    Report Status 03/03/2022 FINAL  Final  Urine Culture     Status: None   Collection Time: 03/01/22 12:47 AM   Specimen: In/Out Cath Urine  Result Value Ref Range Status   Specimen Description IN/OUT CATH URINE  Final   Special Requests NONE  Final   Culture   Final    NO GROWTH Performed at [facility], [place] [address] [place], [place] [address]    Report Status 03/02/2022 FINAL  Final  Culture, blood (Routine X 2) w Reflex to ID Panel     Status: None (Preliminary result)   Collection Time: 03/03/22  9:20 AM   Specimen: BLOOD LEFT FOREARM  Result Value Ref Range Status   Specimen Description BLOOD LEFT FOREARM  Final   Special Requests   Final    BOTTLES DRAWN AEROBIC AND ANAEROBIC Blood Culture adequate volume   Culture   Final    NO GROWTH 2 DAYS Performed at [facility], [place] [address], [place], [place] [address]    Report Status PENDING  Incomplete  Culture, blood (Routine X 2) w Reflex to ID Panel     Status: None (Preliminary result)   Collection Time: 03/03/22  9:20 AM   Specimen: BLOOD  Result Value Ref Range Status   Specimen Description BLOOD RIGHT ANTECUBITAL   Final   Special Requests   Final    BOTTLES DRAWN AEROBIC AND ANAEROBIC Blood Culture results may not be optimal due to an inadequate volume of blood received in culture bottles   Culture   Final    NO GROWTH 2 DAYS Performed at [facility], [place] [address], [place], [place] [address]    Report Status PENDING  Incomplete     Labs: BNP (last 3 results) No results for input(s): BNP in the last 8760 hours. Basic Metabolic Panel: Recent Labs  Lab 02/28/22 1409 03/02/22 1559  NA 130* 132*  K 4.2 4.1  CL 101 100  CO2 21* 23  GLUCOSE 188* 182*  BUN 22* 13  CREATININE 1.25* 1.21  CALCIUM 8.5* 8.2*   Liver Function Tests: Recent Labs  Lab 02/28/22 1409  AST 13*  ALT 20  ALKPHOS 108  BILITOT 1.0  PROT 6.5  ALBUMIN 2.8*   No results for input(s): LIPASE, AMYLASE in the last 168 hours. No results for input(s): AMMONIA in the last 168  hours. CBC: Recent Labs  Lab 02/28/22 1409 03/02/22 1559  WBC 9.8 8.0  NEUTROABS 8.1* 5.4  HGB 10.5* 9.6*  HCT 31.8* 28.5*  MCV 86.4 85.3  PLT 149* 175   Cardiac Enzymes: No results for input(s): CKTOTAL, CKMB, CKMBINDEX, TROPONINI in the last 168 hours. BNP: Invalid input(s): POCBNP CBG: Recent Labs  Lab 03/04/22 1123 03/04/22 1615 03/04/22 2135 03/05/22 0745 03/05/22 1133  GLUCAP 140* 191* 240* 118* 193*   D-Dimer No results for input(s): DDIMER in the last 72 hours. Hgb A1c No results for input(s): HGBA1C in the last 72 hours. Lipid Profile No results for input(s): CHOL, HDL, LDLCALC, TRIG, CHOLHDL, LDLDIRECT in the last 72 hours. Thyroid function studies No results for input(s): TSH, T4TOTAL, T3FREE, THYROIDAB in the last 72 hours.  Invalid input(s): FREET3 Anemia work up Recent Labs    03/04/22 0630  VITAMINB12 532  FOLATE 12.7  FERRITIN 239  TIBC 157*  IRON 17*  RETICCTPCT 0.9   Urinalysis    Component Value Date/Time   COLORURINE YELLOW 03/01/2022 [place] 03/01/2022 0047    APPEARANCEUR Cloudy (A) 09/15/2019 1014   LABSPEC 1.011 03/01/2022 0047   PHURINE 5.0 03/01/2022 0047   GLUCOSEU NEGATIVE 03/01/2022 0047   HGBUR MODERATE (A) 03/01/2022 0047   BILIRUBINUR NEGATIVE 03/01/2022 0047   BILIRUBINUR Negative 09/15/2019 1014   KETONESUR NEGATIVE 03/01/2022 0047   PROTEINUR 100 (A) 03/01/2022 0047   UROBILINOGEN 1.0 02/28/2022 1240   NITRITE NEGATIVE 03/01/2022 0047   LEUKOCYTESUR SMALL (A) 03/01/2022 0047   Sepsis Labs Invalid input(s): PROCALCITONIN,  WBC,  LACTICIDVEN Microbiology Recent Results (from the past 240 hour(s))  Culture, blood (Routine x 2)     Status: Abnormal   Collection Time: 02/28/22  2:07 PM   Specimen: BLOOD RIGHT FOREARM  Result Value Ref Range Status   Specimen Description BLOOD RIGHT FOREARM  Final   Special Requests   Final    BOTTLES DRAWN AEROBIC AND ANAEROBIC Blood Culture results may not be optimal due to an excessive volume of blood received in culture bottles   Culture  Setup Time   Final    GRAM NEGATIVE RODS IN BOTH AEROBIC AND ANAEROBIC BOTTLES CRITICAL RESULT CALLED TO, READ BACK BY AND VERIFIED WITH: PHARMD A WELLBORN [id number] AT 708 AM BY CM Performed at [facility], [place] [address], [place], [place] [address]    Culture (A)  Final    ESCHERICHIA COLI Confirmed Extended Spectrum Beta-Lactamase Producer (ESBL).  In bloodstream infections from ESBL organisms, carbapenems are preferred over piperacillin/tazobactam. They are shown to have a lower risk of mortality.    Report Status 03/03/2022 FINAL  Final   Organism ID, Bacteria ESCHERICHIA COLI  Final      Susceptibility   Escherichia coli - MIC*    AMPICILLIN >=32 RESISTANT Resistant     CEFAZOLIN >=64 RESISTANT Resistant     CEFEPIME 1 SENSITIVE Sensitive     CEFTAZIDIME RESISTANT Resistant     CEFTRIAXONE 32 RESISTANT Resistant     CIPROFLOXACIN >=4 RESISTANT Resistant     GENTAMICIN <=1 SENSITIVE Sensitive     IMIPENEM <=0.25 SENSITIVE Sensitive      TRIMETH/SULFA >=320 RESISTANT Resistant     AMPICILLIN/SULBACTAM >=32 RESISTANT Resistant     PIP/TAZO 16 SENSITIVE Sensitive     * ESCHERICHIA COLI  Blood Culture ID Panel (Reflexed)     Status: Abnormal   Collection Time: 02/28/22  2:07 PM  Result Value Ref Range  Status   Enterococcus faecalis NOT DETECTED NOT DETECTED Final   Enterococcus Faecium NOT DETECTED NOT DETECTED Final   Listeria monocytogenes NOT DETECTED NOT DETECTED Final   Staphylococcus species NOT DETECTED NOT DETECTED Final   Staphylococcus aureus (BCID) NOT DETECTED NOT DETECTED Final   Staphylococcus epidermidis NOT DETECTED NOT DETECTED Final   Staphylococcus lugdunensis NOT DETECTED NOT DETECTED Final   Streptococcus species NOT DETECTED NOT DETECTED Final   Streptococcus agalactiae NOT DETECTED NOT DETECTED Final   Streptococcus pneumoniae NOT DETECTED NOT DETECTED Final   Streptococcus pyogenes NOT DETECTED NOT DETECTED Final   A.calcoaceticus-baumannii NOT DETECTED NOT DETECTED Final   Bacteroides fragilis NOT DETECTED NOT DETECTED Final   Enterobacterales DETECTED (A) NOT DETECTED Final    Comment: Enterobacterales represent a large order of gram negative bacteria, not a single organism. CRITICAL RESULT CALLED TO, READ BACK BY AND VERIFIED WITH: PHARMD A WELLBORN [id number] AT 25 AM BY CM    Enterobacter cloacae complex NOT DETECTED NOT DETECTED Final   Escherichia coli DETECTED (A) NOT DETECTED Final    Comment: CRITICAL RESULT CALLED TO, READ BACK BY AND VERIFIED WITH: PHARMD A WELLBORN [id number] AT 708 AM BY CM    Klebsiella aerogenes NOT DETECTED NOT DETECTED Final   Klebsiella oxytoca NOT DETECTED NOT DETECTED Final   Klebsiella pneumoniae NOT DETECTED NOT DETECTED Final   Proteus species NOT DETECTED NOT DETECTED Final   Salmonella species NOT DETECTED NOT DETECTED Final   Serratia marcescens NOT DETECTED NOT DETECTED Final   Haemophilus influenzae NOT DETECTED NOT DETECTED Final   Neisseria  meningitidis NOT DETECTED NOT DETECTED Final   Pseudomonas aeruginosa NOT DETECTED NOT DETECTED Final   Stenotrophomonas maltophilia NOT DETECTED NOT DETECTED Final   Candida albicans NOT DETECTED NOT DETECTED Final   Candida auris NOT DETECTED NOT DETECTED Final   Candida glabrata NOT DETECTED NOT DETECTED Final   Candida krusei NOT DETECTED NOT DETECTED Final   Candida parapsilosis NOT DETECTED NOT DETECTED Final   Candida tropicalis NOT DETECTED NOT DETECTED Final   Cryptococcus neoformans/gattii NOT DETECTED NOT DETECTED Final   CTX-M ESBL DETECTED (A) NOT DETECTED Final    Comment: CRITICAL RESULT CALLED TO, READ BACK BY AND VERIFIED WITH: PHARMD A WELLBORN [id number] AT 708 AM BY CM (NOTE) Extended spectrum beta-lactamase detected. Recommend a carbapenem as initial therapy.      Carbapenem resistance IMP NOT DETECTED NOT DETECTED Final   Carbapenem resistance KPC NOT DETECTED NOT DETECTED Final   Carbapenem resistance NDM NOT DETECTED NOT DETECTED Final   Carbapenem resist OXA 48 LIKE NOT DETECTED NOT DETECTED Final   Carbapenem resistance VIM NOT DETECTED NOT DETECTED Final    Comment: Performed at [facility], [place] [address], [place], [place] [address]  Culture, blood (Routine x 2)     Status: Abnormal   Collection Time: 02/28/22  2:09 PM   Specimen: BLOOD  Result Value Ref Range Status   Specimen Description BLOOD BLOOD RIGHT FOREARM  Final   Special Requests   Final    BOTTLES DRAWN AEROBIC AND ANAEROBIC Blood Culture adequate volume   Culture  Setup Time   Final    GRAM NEGATIVE RODS ANAEROBIC BOTTLE ONLY CRITICAL VALUE NOTED.  VALUE IS CONSISTENT WITH PREVIOUSLY REPORTED AND CALLED VALUE.    Culture (A)  Final    ESCHERICHIA COLI SUSCEPTIBILITIES PERFORMED ON PREVIOUS CULTURE WITHIN THE LAST 5 DAYS. Performed at [facility], [place] [address], [place], [place] [address]  Report Status 03/03/2022 FINAL  Final  Urine Culture     Status: None   Collection  Time: 03/01/22 12:47 AM   Specimen: In/Out Cath Urine  Result Value Ref Range Status   Specimen Description IN/OUT CATH URINE  Final   Special Requests NONE  Final   Culture   Final    NO GROWTH Performed at [facility], [place] [address], [place], [place] [address]    Report Status 03/02/2022 FINAL  Final  Culture, blood (Routine X 2) w Reflex to ID Panel     Status: None (Preliminary result)   Collection Time: 03/03/22  9:20 AM   Specimen: BLOOD LEFT FOREARM  Result Value Ref Range Status   Specimen Description BLOOD LEFT FOREARM  Final   Special Requests   Final    BOTTLES DRAWN AEROBIC AND ANAEROBIC Blood Culture adequate volume   Culture   Final    NO GROWTH 2 DAYS Performed at [facility], [place] [address], [place], [place] [address]    Report Status PENDING  Incomplete  Culture, blood (Routine X 2) w Reflex to ID Panel     Status: None (Preliminary result)   Collection Time: 03/03/22  9:20 AM   Specimen: BLOOD  Result Value Ref Range Status   Specimen Description BLOOD RIGHT ANTECUBITAL  Final   Special Requests   Final    BOTTLES DRAWN AEROBIC AND ANAEROBIC Blood Culture results may not be optimal due to an inadequate volume of blood received in culture bottles   Culture   Final    NO GROWTH 2 DAYS Performed at [facility], [address], [place],  [address]    Report Status PENDING  Incomplete    Procedures/Studies: DG Chest 2 View  Result Date: 02/28/2022 CLINICAL DATA:  Suspected sepsis, fever EXAM: CHEST - 2 VIEW COMPARISON:  07/05/2020 FINDINGS: Transverse diameter of heart is increased. There is poor inspiration. There are no signs of pulmonary edema or focal pulmonary consolidation. There is no pleural effusion or pneumothorax. There is evidence of surgical fusion in the lower thoracic and possibly lumbar spine. IMPRESSION: Poor inspiration. There are no signs of pulmonary edema or focal pulmonary consolidation. Electronically Signed   By:  Elmer Picker M.D.   On: 02/28/2022 14:55   CT ABDOMEN PELVIS W CONTRAST  Result Date: 03/03/2022 CLINICAL DATA:  Fever and chills. EXAM: CT ABDOMEN AND PELVIS WITH CONTRAST TECHNIQUE: Multidetector CT imaging of the abdomen and pelvis was performed using the standard protocol following bolus administration of intravenous contrast. RADIATION DOSE REDUCTION: This exam was performed according to the departmental dose-optimization program which includes automated exposure control, adjustment of the mA and/or kV according to patient size and/or use of iterative reconstruction technique. CONTRAST:  140m OMNIPAQUE IOHEXOL 300 MG/ML  SOLN COMPARISON:  May 14, 2021 FINDINGS: Lower chest: No acute abnormality. Hepatobiliary: There is diffuse fatty infiltration of the liver parenchyma. A 1.1 cm cystic appearing area is seen within the right lobe of the liver, adjacent to the gallbladder fossa. Status post cholecystectomy. No biliary dilatation. Pancreas: Unremarkable. No pancreatic ductal dilatation or surrounding inflammatory changes. Spleen: Normal in size without focal abnormality. Adrenals/Urinary Tract: Adrenal glands are unremarkable. Kidneys are normal in size, without renal calculi, focal lesion, or hydronephrosis. Mild, bilateral, nonspecific perinephric inflammatory fat stranding is seen. Mild, stable, predominant posterior urinary bladder wall thickening is seen. A stable posterolateral 19 mm x 11 mm urinary bladder diverticulum is seen on the  left. An additional 3.0 cm x 2.0 cm anterior urinary bladder diverticulum is noted. Stomach/Bowel: There is a very small hiatal hernia. Appendix appears normal. No evidence of bowel wall thickening, distention, or inflammatory changes. Vascular/Lymphatic: No significant vascular findings are present. No enlarged abdominal or pelvic lymph nodes. Reproductive: The prostate gland is mildly enlarged and heterogeneous in appearance. This is stable when compared to the  prior study. Other: There is a stable 2.9 cm x 2.5 cm fat containing left inguinal hernia. No abdominopelvic ascites. Musculoskeletal: Postoperative changes are seen within the visualized portion of the lower thoracic spine and upper lumbar spine. No acute osseous abnormalities are identified IMPRESSION: 1. Bilateral perinephric inflammatory fat stranding which may represent sequelae associated with acute pyelonephritis. Correlation with urinalysis is recommended. 2. Hepatic steatosis. 3. Evidence of prior cholecystectomy. 4. Stable urinary bladder diverticula. Electronically Signed   By: Virgina Norfolk M.D.   On: 03/03/2022 19:51   [place] EKG SITE RITE  Result Date: 03/04/2022 If Site Rite image not attached, placement could not be confirmed due to current cardiac rhythm.    Time coordinating discharge: Over 30 minutes    Dwyane Dee, MD  [facility] 03/05/2022, 3:17 PM

## 2022-03-05 NOTE — Plan of Care (Signed)
  Problem: Education: Goal: Knowledge of General Education information will improve Description: Including pain rating scale, medication(s)/side effects and non-pharmacologic comfort measures Outcome: Completed/Met   Problem: Health Behavior/Discharge Planning: Goal: Ability to manage health-related needs will improve Outcome: Completed/Met   Problem: Clinical Measurements: Goal: Ability to maintain clinical measurements within normal limits will improve Outcome: Completed/Met Goal: Will remain free from infection Outcome: Completed/Met Goal: Diagnostic test results will improve Outcome: Completed/Met Goal: Respiratory complications will improve Outcome: Completed/Met Goal: Cardiovascular complication will be avoided Outcome: Completed/Met   Problem: Activity: Goal: Risk for activity intolerance will decrease Outcome: Completed/Met   Problem: Nutrition: Goal: Adequate nutrition will be maintained Outcome: Completed/Met   Problem: Coping: Goal: Level of anxiety will decrease Outcome: Completed/Met   Problem: Elimination: Goal: Will not experience complications related to bowel motility Outcome: Completed/Met Goal: Will not experience complications related to urinary retention Outcome: Completed/Met   Problem: Pain Managment: Goal: General experience of comfort will improve Outcome: Completed/Met   Problem: Safety: Goal: Ability to remain free from injury will improve Outcome: Completed/Met   Problem: Skin Integrity: Goal: Risk for impaired skin integrity will decrease Outcome: Completed/Met   Problem: Urinary Elimination: Goal: Signs and symptoms of infection will decrease Outcome: Completed/Met

## 2022-03-05 NOTE — Progress Notes (Signed)
DISCHARGE NOTE HOME John Meza to be discharged Home per MD order. Discussed prescriptions and follow up appointments with the patient. Prescriptions given to patient; medication list explained in detail. Patient verbalized understanding.  Skin clean, dry and intact without evidence of skin break down, no evidence of skin tears noted. IV catheter discontinued intact. Site without signs and symptoms of complications. Dressing and pressure applied. Pt denies pain at the site currently. No complaints noted.  Patient free or drains and wounds. Being discharged with Left arm Midline.   An After Visit Summary (AVS) was printed and given to the patient. Patient escorted via wheelchair, and discharged home via private auto.  Myrtis Hopping, RN

## 2022-03-05 NOTE — Plan of Care (Signed)
  Problem: Urinary Elimination: Goal: Signs and symptoms of infection will decrease Outcome: Progressing   

## 2022-03-05 NOTE — TOC Transition Note (Signed)
Transition of Care [facility]) - CM/SW Discharge Note   Patient Details  Name: John Meza MRN: [id number] Date of Birth: April 27, 1961  Transition of Care [facility]) CM/SW Contact:  Tom-Johnson, Hershal Coria, RN Phone Number: 03/05/2022, 5:27 PM   Clinical Narrative:     Patient is scheduled for discharge today. Home IV abx infusion referral with [facility] and Pam did education with family at bedside. Home health RN LOG referral with [facility], Revonda Standard voiced acceptance. Orders,demographics emailed to [email or web address] with receipt noted.  Family to transport at discharge. No further TOC needs noted.  Final next level of care: Home w Home Health Services Barriers to Discharge: Barriers Resolved   Patient Goals and CMS Choice Patient states their goals for this hospitalization and ongoing recovery are:: To return home CMS Medicare.gov Compare Post Acute Care list provided to:: Patient Choice offered to / list presented to : Patient, Spouse, Adult Children  Discharge Placement                Patient to be transferred to facility by: Family      Discharge Plan and Services                DME Arranged: N/A DME Agency: NA       HH Arranged: RN, IV Antibiotics HH Agency: Surveyor, mining, Other - See comment ([facility] for [facility]) Date HH Agency Contacted: 03/05/22 Time HH Agency Contacted: 1400 Representative spoke with at [facility] Agency: Jeri Modena with [facility] ad Revonda Standard with [facility]  Social Determinants of Health (SDOH) Interventions     Readmission Risk Interventions     View : No data to display.

## 2022-03-06 ENCOUNTER — Telehealth: Payer: Self-pay

## 2022-03-06 NOTE — Telephone Encounter (Signed)
Called pt and he will be here Tuesday for paperwork, I will have it for on tues

## 2022-03-06 NOTE — Telephone Encounter (Signed)
Transition Care Management Follow-up Telephone Call   Date of discharge and from where:[facility] on 03/05/2022 How have you been since you were released from the hospital? [facility]  Any questions or concerns? No questions/concerns reported.  Items Reviewed: Did the pt receive and understand the discharge instructions provided? have the instructions and have no questions.  Medications obtained and verified? Pt and wife on the speaker phone ok with the patient pt for the wife to be present, wife said that they have the medication list  and the hospital staff reviewed them in detail prior to discharge. She said that he has all of the medications and they have no questions.  Any new allergies since your discharge? None reported  Do you have support at home? Yes, wife Other (ie: DME, Home Health, etc)   HH RN      Functional Questionnaire: (I = Independent and D = Dependent) ADL's:  Independent.        Follow up appointments reviewed:   PCP Hospital f/u appt confirmed? NP Meredeth Ide 96/14/2023@ 1050.  Specialist Hospital f/u appt confirmed? scheduled at this time  Are transportation arrangements needed? have transportation  If their condition worsens, is the pt aware to call  their PCP or go to the ED? yes Was the patient provided with contact information for the PCP's office or ED? He has the phone number  Was the pt encouraged to call back with questions or concerns?yes

## 2022-03-08 LAB — CULTURE, BLOOD (ROUTINE X 2)
Culture: NO GROWTH
Culture: NO GROWTH
Special Requests: ADEQUATE

## 2022-03-10 ENCOUNTER — Telehealth: Payer: Self-pay

## 2022-03-10 NOTE — Telephone Encounter (Signed)
Call placed to patient with assistance of Spanish Interpreter # [id number]/[employer] to schedule hospital follow up with Dr [name].  Appointment was then scheduled for 03/18/2022 with Dr [name] and the appointment with Bertram Denver, NP on 03/25/2022 was cancelled.

## 2022-03-11 ENCOUNTER — Other Ambulatory Visit: Payer: Self-pay

## 2022-03-12 NOTE — Telephone Encounter (Signed)
Noted will call in the morning 

## 2022-03-12 NOTE — Telephone Encounter (Signed)
Pt has called re paperwork 5.30 came to get and was not signed by provider, pls call pt when signed @ [phone]

## 2022-03-13 NOTE — Telephone Encounter (Signed)
Called pt and he is aware   Will leave paperwork at front   Interpreter ID# [place] [phone]

## 2022-03-16 ENCOUNTER — Other Ambulatory Visit: Payer: Self-pay

## 2022-03-16 ENCOUNTER — Telehealth: Payer: Self-pay

## 2022-03-16 ENCOUNTER — Ambulatory Visit (INDEPENDENT_AMBULATORY_CARE_PROVIDER_SITE_OTHER): Payer: No Typology Code available for payment source | Admitting: Infectious Diseases

## 2022-03-16 ENCOUNTER — Encounter: Payer: Self-pay | Admitting: Infectious Diseases

## 2022-03-16 VITALS — BP 119/75 | HR 102 | Temp 98.3°F

## 2022-03-16 DIAGNOSIS — Z5181 Encounter for therapeutic drug level monitoring: Secondary | ICD-10-CM

## 2022-03-16 DIAGNOSIS — A499 Bacterial infection, unspecified: Secondary | ICD-10-CM

## 2022-03-16 DIAGNOSIS — R6883 Chills (without fever): Secondary | ICD-10-CM

## 2022-03-16 DIAGNOSIS — B9620 Unspecified Escherichia coli [E. coli] as the cause of diseases classified elsewhere: Secondary | ICD-10-CM

## 2022-03-16 DIAGNOSIS — E875 Hyperkalemia: Secondary | ICD-10-CM

## 2022-03-16 DIAGNOSIS — R7881 Bacteremia: Secondary | ICD-10-CM | POA: Insufficient documentation

## 2022-03-16 DIAGNOSIS — Z1612 Extended spectrum beta lactamase (ESBL) resistance: Secondary | ICD-10-CM

## 2022-03-16 DIAGNOSIS — Z452 Encounter for adjustment and management of vascular access device: Secondary | ICD-10-CM

## 2022-03-16 DIAGNOSIS — N12 Tubulo-interstitial nephritis, not specified as acute or chronic: Secondary | ICD-10-CM

## 2022-03-16 HISTORY — DX: Hyperkalemia: E87.5

## 2022-03-16 HISTORY — DX: Extended spectrum beta lactamase (ESBL) resistance: A49.9

## 2022-03-16 HISTORY — DX: Unspecified Escherichia coli (E. coli) as the cause of diseases classified elsewhere: B96.20

## 2022-03-16 NOTE — Telephone Encounter (Signed)
Order sent to Jeri Modena, RN with [facility] to pull midline today 03/16/22 per Dr. Elinor Parkinson. Orders shared with [facility] pharmacy team.   Sandie Ano, RN

## 2022-03-16 NOTE — Progress Notes (Unsigned)
Per verbal order from Dr. [name], 8 cm midline removed from left brachial, tip intact. No sutures present. RN confirmed length per chart. Dressing was clean and dry. Insertion site cleaned with CHG. Petroleum dressing applied. Patient advised no heavy lifting with this arm and to leave dressing in place for 24 hours and not to shower affected arm for 24 hours. Advised patient to seek emergency medical care if dressing becomes soaked with blood, swelling, or sharp pain presents. Advised patient to seek emergent care if develops neurological symptoms, chest pain, or shortness of breath. Instructed patient to notify office if they notice redness, warmth, or drainage at the site. Patient verbalized understanding and agreement. RN answered patient's questions. Patient tolerated procedure well and RN walked patient to check out. RN notified Carolynn Sayers at [facility] and [facility] of removal.  All instructions were relayed via interpreter. Patient and his wife have no questions.   Beryle Flock, RN

## 2022-03-16 NOTE — Progress Notes (Addendum)
Patient Active Problem List   Diagnosis Date Noted   Normocytic anemia 03/03/2022   Sepsis due to Escherichia coli (E. coli) ([place]) 03/01/2022   UTI due to extended-spectrum beta lactamase (ESBL) producing Escherichia coli 02/28/2022   Wheelchair dependence 12/23/2020   History of cholecystectomy 11/18/2020   Insulin dependent type 2 diabetes mellitus, controlled ([place])    Hyponatremia    Essential hypertension    Neurogenic bladder 01/06/2012   Neurogenic bowel 01/06/2012   Paraplegia ([place]) 11/16/2011   OBESITY, MODERATE 05/26/2007   HLD (hyperlipidemia) 05/25/2007    Patient's Medications  New Prescriptions   No medications on file  Previous Medications   ATORVASTATIN (LIPITOR) 20 MG TABLET    TAKE 1 TABLET (20 MG TOTAL) BY MOUTH DAILY. TO LOWER CHOLESTEROL   INSULIN GLARGINE (BASAGLAR KWIKPEN) 100 UNIT/ML    Inject 25 Units into the skin at bedtime.   INSULIN PEN NEEDLE (TECHLITE PEN NEEDLES) 32G X 4 MM MISC    Use as directed to inject insulin   METFORMIN (GLUCOPHAGE) 1000 MG TABLET    TAKE 1 TABLET (1,000 MG TOTAL) BY MOUTH 2 (TWO) TIMES DAILY WITH A MEAL.   METHOCARBAMOL (ROBAXIN) 500 MG TABLET    Take 1 tablet (500 mg total) by mouth every 6 (six) hours as needed for muscle spasms.   OMEPRAZOLE (PRILOSEC) 20 MG CAPSULE    Take 1 capsule by mouth daily to reduce stomach acid   SITAGLIPTIN (JANUVIA) 100 MG TABLET    Take 1 tablet by mouth daily   VALSARTAN-HYDROCHLOROTHIAZIDE (DIOVAN-HCT) 320-25 MG TABLET    Take 1 tablet by mouth daily.  Modified Medications   No medications on file  Discontinued Medications   No medications on file    Subjective: 61 Y O male with a PMH of DM, HTN, paraplegia s/p Fall, DU rt buttock, neurogenic bladder s/p self catheterization who is here for HFU ( 5/20-5/25) for ESBL E coli bacteremia and b/l pyelonephritis. Patient was seen by ID in the hospital. Received IV meropenem in the hospital, and was discharged on 5/25 on ertapenem via midline  placement. EOT 03/14/22.   Spoke with patient with the help of an In person [place] interpreter. Wife has been injecting IV ertapenem at home daily. Denies missing doses. Denies any nausea, vomiting and diarrhea. However, he complains of subjective fevers and shaking since last Monday after [facility] nurse changed " something" in midline. Temperatures not recorded at home. He is also having pain/tenderness at the midline site for a week now. Denies any swelling, chest pain or SOB. Wife says wound in the back has closed.   Review of Systems: ROS all systems reviewed with pertinent positives and negatives as listed above  Past Medical History:  Diagnosis Date   Cellulitis and abscess of buttock 10/2016   Diabetes mellitus    Hypertension    Paraplegia ([place]) 2013   fell from ladder   Pressure ulcer, buttock, right, unstageable ([place]) 12/30/2020   Sacral decubitus ulcer, stage IV ([place]) 11/03/2021   SCI (spinal cord injury)    Spine fracture 11/16/2011   T 11- T9-L1   Past Surgical History:  Procedure Laterality Date   CHOLECYSTECTOMY N/A 05/01/2015   Procedure: LAPAROSCOPIC CHOLECYSTECTOMY WITH ATTEMPTED INTRAOPERATIVE CHOLANGIOGRAM;  Surgeon: Georganna Skeans, MD;  Location: [facility];  Service: General;  Laterality: N/A;   SPINE SURGERY      Social History   Tobacco Use   Smoking status: Former    Packs/day: 1.00  Years: 5.00    Pack years: 5.00    Types: Cigarettes    Quit date: 10/13/2011    Years since quitting: 10.4   Smokeless tobacco: Never   Tobacco comments:    03-24-19 per pt he stopped 1 mo ago   Vaping Use   Vaping Use: Never used  Substance Use Topics   Alcohol use: Not Currently    Alcohol/week: 1.0 standard drink    Types: 1 Cans of beer per week   Drug use: Never    Family History  Problem Relation Age of Onset   Healthy Mother    Diabetes Father    Diabetes Sister     Allergies  Allergen Reactions   Macrobid [Nitrofurantoin] Itching and Rash    Health Maintenance   Topic Date Due   COVID-19 Vaccine (4 - Booster for Pfizer series) 01/02/2021   Zoster Vaccines- Shingrix (2 of 2) 03/30/2022   INFLUENZA VACCINE  05/12/2022   HEMOGLOBIN A1C  08/04/2022   OPHTHALMOLOGY EXAM  10/24/2022   COLON CANCER SCREENING ANNUAL FOBT  02/03/2023   TETANUS/TDAP  12/18/2027   Hepatitis C Screening  Completed   HIV Screening  Completed   HPV VACCINES  Aged Out   FOOT EXAM  Discontinued   COLONOSCOPY (Pts 45-44yr Insurance coverage will need to be confirmed)  Discontinued    Objective: BP 119/75   Pulse (!) 102   Temp 98.3 F (36.8 C) (Temporal)   SpO2 100%    Physical Exam Constitutional:      Appearance: Normal appearance. Sitting in a wheel chair  HENT:     Head: Normocephalic and atraumatic.      Mouth: Mucous membranes are moist. Dentures + Eyes:    Conjunctiva/sclera: Conjunctivae normal.     Pupils:  Cardiovascular:     Rate and Rhythm: Normal rate and regular rhythm.     Heart sounds:  Pulmonary:     Effort: Pulmonary effort is normal.     Breath sounds: Normal breath sounds.   Abdominal:     General: Non distended     Palpations: soft.   Musculoskeletal:        General: paraplegia  Skin:    General: Skin is warm and dry.     Comments:  Neurological:     Mental Status: awake, alert and oriented to person, place, and time.   Psychiatric:        Mood and Affect: Mood normal.   Lab Results Lab Results  Component Value Date   WBC 8.0 03/02/2022   HGB 9.6 (L) 03/02/2022   HCT 28.5 (L) 03/02/2022   MCV 85.3 03/02/2022   PLT 175 03/02/2022    Lab Results  Component Value Date   CREATININE 1.21 03/02/2022   BUN 13 03/02/2022   NA 132 (L) 03/02/2022   K 4.1 03/02/2022   CL 100 03/02/2022   CO2 23 03/02/2022    Lab Results  Component Value Date   ALT 20 02/28/2022   AST 13 (L) 02/28/2022   ALKPHOS 108 02/28/2022   BILITOT 1.0 02/28/2022    Lab Results  Component Value Date   CHOL 110 12/28/2019   HDL 40  12/28/2019   LDLCALC 50 12/28/2019   TRIG 110 12/28/2019   CHOLHDL 2.8 12/28/2019   No results found for: LABRPR, RPRTITER No results found for: HIV1RNAQUANT, HIV1RNAVL, CD4TABS   Microbiology Results for orders placed or performed during the hospital encounter of 02/28/22  Culture, blood (Routine x 2)  Status: Abnormal   Collection Time: 02/28/22  2:07 PM   Specimen: BLOOD RIGHT FOREARM  Result Value Ref Range Status   Specimen Description BLOOD RIGHT FOREARM  Final   Special Requests   Final    BOTTLES DRAWN AEROBIC AND ANAEROBIC Blood Culture results may not be optimal due to an excessive volume of blood received in culture bottles   Culture  Setup Time   Final    GRAM NEGATIVE RODS IN BOTH AEROBIC AND ANAEROBIC BOTTLES CRITICAL RESULT CALLED TO, READ BACK BY AND VERIFIED WITH: PHARMD A WELLBORN [id number] AT 40 AM BY CM Performed at [facility], [place] [address], [place], [place] [address]    Culture (A)  Final    ESCHERICHIA COLI Confirmed Extended Spectrum Beta-Lactamase Producer (ESBL).  In bloodstream infections from ESBL organisms, carbapenems are preferred over piperacillin/tazobactam. They are shown to have a lower risk of mortality.    Report Status 03/03/2022 FINAL  Final   Organism ID, Bacteria ESCHERICHIA COLI  Final      Susceptibility   Escherichia coli - MIC*    AMPICILLIN >=32 RESISTANT Resistant     CEFAZOLIN >=64 RESISTANT Resistant     CEFEPIME 1 SENSITIVE Sensitive     CEFTAZIDIME RESISTANT Resistant     CEFTRIAXONE 32 RESISTANT Resistant     CIPROFLOXACIN >=4 RESISTANT Resistant     GENTAMICIN <=1 SENSITIVE Sensitive     IMIPENEM <=0.25 SENSITIVE Sensitive     TRIMETH/SULFA >=320 RESISTANT Resistant     AMPICILLIN/SULBACTAM >=32 RESISTANT Resistant     PIP/TAZO 16 SENSITIVE Sensitive     * ESCHERICHIA COLI  Blood Culture ID Panel (Reflexed)     Status: Abnormal   Collection Time: 02/28/22  2:07 PM  Result Value Ref Range Status    Enterococcus faecalis NOT DETECTED NOT DETECTED Final   Enterococcus Faecium NOT DETECTED NOT DETECTED Final   Listeria monocytogenes NOT DETECTED NOT DETECTED Final   Staphylococcus species NOT DETECTED NOT DETECTED Final   Staphylococcus aureus (BCID) NOT DETECTED NOT DETECTED Final   Staphylococcus epidermidis NOT DETECTED NOT DETECTED Final   Staphylococcus lugdunensis NOT DETECTED NOT DETECTED Final   Streptococcus species NOT DETECTED NOT DETECTED Final   Streptococcus agalactiae NOT DETECTED NOT DETECTED Final   Streptococcus pneumoniae NOT DETECTED NOT DETECTED Final   Streptococcus pyogenes NOT DETECTED NOT DETECTED Final   A.calcoaceticus-baumannii NOT DETECTED NOT DETECTED Final   Bacteroides fragilis NOT DETECTED NOT DETECTED Final   Enterobacterales DETECTED (A) NOT DETECTED Final    Comment: Enterobacterales represent a large order of gram negative bacteria, not a single organism. CRITICAL RESULT CALLED TO, READ BACK BY AND VERIFIED WITH: PHARMD A WELLBORN [id number] AT 49 AM BY CM    Enterobacter cloacae complex NOT DETECTED NOT DETECTED Final   Escherichia coli DETECTED (A) NOT DETECTED Final    Comment: CRITICAL RESULT CALLED TO, READ BACK BY AND VERIFIED WITH: PHARMD A WELLBORN [id number] AT 708 AM BY CM    Klebsiella aerogenes NOT DETECTED NOT DETECTED Final   Klebsiella oxytoca NOT DETECTED NOT DETECTED Final   Klebsiella pneumoniae NOT DETECTED NOT DETECTED Final   Proteus species NOT DETECTED NOT DETECTED Final   Salmonella species NOT DETECTED NOT DETECTED Final   Serratia marcescens NOT DETECTED NOT DETECTED Final   Haemophilus influenzae NOT DETECTED NOT DETECTED Final   Neisseria meningitidis NOT DETECTED NOT DETECTED Final   Pseudomonas aeruginosa NOT DETECTED NOT DETECTED Final   Stenotrophomonas maltophilia NOT DETECTED NOT  DETECTED Final   Candida albicans NOT DETECTED NOT DETECTED Final   Candida auris NOT DETECTED NOT DETECTED Final   Candida glabrata NOT  DETECTED NOT DETECTED Final   Candida krusei NOT DETECTED NOT DETECTED Final   Candida parapsilosis NOT DETECTED NOT DETECTED Final   Candida tropicalis NOT DETECTED NOT DETECTED Final   Cryptococcus neoformans/gattii NOT DETECTED NOT DETECTED Final   CTX-M ESBL DETECTED (A) NOT DETECTED Final    Comment: CRITICAL RESULT CALLED TO, READ BACK BY AND VERIFIED WITH: PHARMD A WELLBORN [id number] AT 708 AM BY CM (NOTE) Extended spectrum beta-lactamase detected. Recommend a carbapenem as initial therapy.      Carbapenem resistance IMP NOT DETECTED NOT DETECTED Final   Carbapenem resistance KPC NOT DETECTED NOT DETECTED Final   Carbapenem resistance NDM NOT DETECTED NOT DETECTED Final   Carbapenem resist OXA 48 LIKE NOT DETECTED NOT DETECTED Final   Carbapenem resistance VIM NOT DETECTED NOT DETECTED Final    Comment: Performed at [facility], [place] [address] [place], [place] [address]  Culture, blood (Routine x 2)     Status: Abnormal   Collection Time: 02/28/22  2:09 PM   Specimen: BLOOD  Result Value Ref Range Status   Specimen Description BLOOD BLOOD RIGHT FOREARM  Final   Special Requests   Final    BOTTLES DRAWN AEROBIC AND ANAEROBIC Blood Culture adequate volume   Culture  Setup Time   Final    GRAM NEGATIVE RODS ANAEROBIC BOTTLE ONLY CRITICAL VALUE NOTED.  VALUE IS CONSISTENT WITH PREVIOUSLY REPORTED AND CALLED VALUE.    Culture (A)  Final    ESCHERICHIA COLI SUSCEPTIBILITIES PERFORMED ON PREVIOUS CULTURE WITHIN THE LAST 5 DAYS. Performed at [facility], [place] [address] [place], [place] [address]    Report Status 03/03/2022 FINAL  Final  Urine Culture     Status: None   Collection Time: 03/01/22 12:47 AM   Specimen: In/Out Cath Urine  Result Value Ref Range Status   Specimen Description IN/OUT CATH URINE  Final   Special Requests NONE  Final   Culture   Final    NO GROWTH Performed at [facility], [place] [address], [place], [place] [address]    Report  Status 03/02/2022 FINAL  Final  Culture, blood (Routine X 2) w Reflex to ID Panel     Status: None   Collection Time: 03/03/22  9:20 AM   Specimen: BLOOD LEFT FOREARM  Result Value Ref Range Status   Specimen Description BLOOD LEFT FOREARM  Final   Special Requests   Final    BOTTLES DRAWN AEROBIC AND ANAEROBIC Blood Culture adequate volume   Culture   Final    NO GROWTH 5 DAYS Performed at [facility], [place] [address], [place], [place] [address]    Report Status 03/08/2022 FINAL  Final  Culture, blood (Routine X 2) w Reflex to ID Panel     Status: None   Collection Time: 03/03/22  9:20 AM   Specimen: BLOOD  Result Value Ref Range Status   Specimen Description BLOOD RIGHT ANTECUBITAL  Final   Special Requests   Final    BOTTLES DRAWN AEROBIC AND ANAEROBIC Blood Culture results may not be optimal due to an inadequate volume of blood received in culture bottles   Culture   Final    NO GROWTH 5 DAYS Performed at [facility], [place] [address] [place],  [address]    Report  Status 03/08/2022 FINAL  Final   Imaging DG Chest 2 View  Result Date: 02/28/2022 CLINICAL DATA:  Suspected sepsis, fever EXAM: CHEST - 2 VIEW COMPARISON:  07/05/2020 FINDINGS: Transverse diameter of heart is increased. There is poor inspiration. There are no signs of pulmonary edema or focal pulmonary consolidation. There is no pleural effusion or pneumothorax. There is evidence of surgical fusion in the lower thoracic and possibly lumbar spine. IMPRESSION: Poor inspiration. There are no signs of pulmonary edema or focal pulmonary consolidation. Electronically Signed   By: Elmer Picker M.D.   On: 02/28/2022 14:55   CT ABDOMEN PELVIS W CONTRAST  Result Date: 03/03/2022 CLINICAL DATA:  Fever and chills. EXAM: CT ABDOMEN AND PELVIS WITH CONTRAST TECHNIQUE: Multidetector CT imaging of the abdomen and pelvis was performed using the standard protocol following bolus administration of intravenous  contrast. RADIATION DOSE REDUCTION: This exam was performed according to the departmental dose-optimization program which includes automated exposure control, adjustment of the mA and/or kV according to patient size and/or use of iterative reconstruction technique. CONTRAST:  139m OMNIPAQUE IOHEXOL 300 MG/ML  SOLN COMPARISON:  May 14, 2021 FINDINGS: Lower chest: No acute abnormality. Hepatobiliary: There is diffuse fatty infiltration of the liver parenchyma. A 1.1 cm cystic appearing area is seen within the right lobe of the liver, adjacent to the gallbladder fossa. Status post cholecystectomy. No biliary dilatation. Pancreas: Unremarkable. No pancreatic ductal dilatation or surrounding inflammatory changes. Spleen: Normal in size without focal abnormality. Adrenals/Urinary Tract: Adrenal glands are unremarkable. Kidneys are normal in size, without renal calculi, focal lesion, or hydronephrosis. Mild, bilateral, nonspecific perinephric inflammatory fat stranding is seen. Mild, stable, predominant posterior urinary bladder wall thickening is seen. A stable posterolateral 19 mm x 11 mm urinary bladder diverticulum is seen on the left. An additional 3.0 cm x 2.0 cm anterior urinary bladder diverticulum is noted. Stomach/Bowel: There is a very small hiatal hernia. Appendix appears normal. No evidence of bowel wall thickening, distention, or inflammatory changes. Vascular/Lymphatic: No significant vascular findings are present. No enlarged abdominal or pelvic lymph nodes. Reproductive: The prostate gland is mildly enlarged and heterogeneous in appearance. This is stable when compared to the prior study. Other: There is a stable 2.9 cm x 2.5 cm fat containing left inguinal hernia. No abdominopelvic ascites. Musculoskeletal: Postoperative changes are seen within the visualized portion of the lower thoracic spine and upper lumbar spine. No acute osseous abnormalities are identified IMPRESSION: 1. Bilateral perinephric  inflammatory fat stranding which may represent sequelae associated with acute pyelonephritis. Correlation with urinalysis is recommended. 2. Hepatic steatosis. 3. Evidence of prior cholecystectomy. 4. Stable urinary bladder diverticula. Electronically Signed   By: TVirgina NorfolkM.D.   On: 03/03/2022 19:51   [place] SITE RITE  Result Date: 03/04/2022 If Site Rite image not attached, placement could not be confirmed due to current cardiac rhythm.   Problem List Items Addressed This Visit       Genitourinary   Pyelonephritis due to Escherichia coli - Primary     Other   Bacteremia   Medication monitoring encounter   ESBL (extended spectrum beta-lactamase) producing bacteria infection   Assessment/Plan B/L pyelonephritis ESBL E coli bacteremia -Completed 2 weeks of carbapenem on 03/14/22  Medication monitoring  5/30 wbc 3.1, hb 9.2, plts 446, ALP 252, ast 50, alt 44, ESR 73, CRP 205, K 9.1( was not notified)  Hyperkalemia  BMP today  Midline - pain and tenderness+ - remove midline - Venous duplex of rt UE  Chills/shaking Blood cx * 2 CBC, ESR and CRP  I have personally spent 64 minutes involved in face-to-face and non-face-to-face activities for this patient on the day of the visit. Professional time spent includes the following activities: Preparing to see the patient (review of tests), Obtaining and/or reviewing separately obtained history (admission/discharge record), Performing a medically appropriate examination and/or evaluation , Ordering medications/tests/procedures, referring and communicating with other health care professionals, Documenting clinical information in the EMR, Independently interpreting results (not separately reported), Communicating results to the patient/family/caregiver, Counseling and educating the patient/family/caregiver and Care coordination (not separately reported).   Wilber Oliphant, [facility] 03/16/2022, 9:05 AM

## 2022-03-17 ENCOUNTER — Ambulatory Visit (HOSPITAL_COMMUNITY)
Admission: RE | Admit: 2022-03-17 | Discharge: 2022-03-17 | Disposition: A | Discharge status: Home / Self Care | Payer: No Typology Code available for payment source | Source: Ambulatory Visit | Attending: Infectious Diseases | Admitting: Infectious Diseases

## 2022-03-17 ENCOUNTER — Telehealth: Payer: Self-pay

## 2022-03-17 DIAGNOSIS — Z452 Encounter for adjustment and management of vascular access device: Secondary | ICD-10-CM

## 2022-03-17 NOTE — Telephone Encounter (Signed)
-----   Message from Odette Fraction, MD sent at 03/17/2022  3:50 PM EDT ----- Regarding: FW:  Please let the patient know Ultrasound was negative for DVT in the left upper extremity  ----- Message ----- From: Janace Hoard Lab Results In Sent: 03/16/2022   4:45 PM EDT To: Odette Fraction, MD

## 2022-03-17 NOTE — Telephone Encounter (Signed)
I spoke to the patient and relayed [place] results. Patient verbalized understanding.  John Meza T John Meza

## 2022-03-18 ENCOUNTER — Encounter: Payer: Self-pay | Admitting: Critical Care Medicine

## 2022-03-18 ENCOUNTER — Ambulatory Visit
Payer: No Typology Code available for payment source | Attending: Critical Care Medicine | Admitting: Critical Care Medicine

## 2022-03-18 ENCOUNTER — Telehealth: Payer: Self-pay

## 2022-03-18 ENCOUNTER — Other Ambulatory Visit: Payer: Self-pay

## 2022-03-18 VITALS — BP 117/72 | HR 99

## 2022-03-18 DIAGNOSIS — B9620 Unspecified Escherichia coli [E. coli] as the cause of diseases classified elsewhere: Secondary | ICD-10-CM

## 2022-03-18 DIAGNOSIS — E875 Hyperkalemia: Secondary | ICD-10-CM

## 2022-03-18 DIAGNOSIS — A4151 Sepsis due to Escherichia coli [E. coli]: Secondary | ICD-10-CM

## 2022-03-18 DIAGNOSIS — E871 Hypo-osmolality and hyponatremia: Secondary | ICD-10-CM

## 2022-03-18 DIAGNOSIS — R7881 Bacteremia: Secondary | ICD-10-CM

## 2022-03-18 DIAGNOSIS — N390 Urinary tract infection, site not specified: Secondary | ICD-10-CM

## 2022-03-18 DIAGNOSIS — E785 Hyperlipidemia, unspecified: Secondary | ICD-10-CM

## 2022-03-18 DIAGNOSIS — B9629 Other Escherichia coli [E. coli] as the cause of diseases classified elsewhere: Secondary | ICD-10-CM

## 2022-03-18 DIAGNOSIS — E1165 Type 2 diabetes mellitus with hyperglycemia: Secondary | ICD-10-CM

## 2022-03-18 DIAGNOSIS — E119 Type 2 diabetes mellitus without complications: Secondary | ICD-10-CM

## 2022-03-18 DIAGNOSIS — N12 Tubulo-interstitial nephritis, not specified as acute or chronic: Secondary | ICD-10-CM

## 2022-03-18 DIAGNOSIS — Z794 Long term (current) use of insulin: Secondary | ICD-10-CM

## 2022-03-18 DIAGNOSIS — A499 Bacterial infection, unspecified: Secondary | ICD-10-CM

## 2022-03-18 DIAGNOSIS — Z1612 Extended spectrum beta lactamase (ESBL) resistance: Secondary | ICD-10-CM

## 2022-03-18 DIAGNOSIS — Z452 Encounter for adjustment and management of vascular access device: Secondary | ICD-10-CM

## 2022-03-18 DIAGNOSIS — I10 Essential (primary) hypertension: Secondary | ICD-10-CM

## 2022-03-18 LAB — GLUCOSE, POCT (MANUAL RESULT ENTRY): POC Glucose: 161 mg/dl — AB (ref 70–99)

## 2022-03-18 MED ORDER — SITAGLIPTIN PHOSPHATE 100 MG PO TABS
ORAL_TABLET | ORAL | 3 refills | Status: DC
Start: 2022-03-18 — End: 2022-07-23
  Filled 2022-03-18: qty 90, fill #0

## 2022-03-18 MED ORDER — VALSARTAN-HYDROCHLOROTHIAZIDE 320-25 MG PO TABS
1.0000 | ORAL_TABLET | Freq: Every day | ORAL | 3 refills | Status: DC
  Filled 2022-03-18: qty 90, 90d supply, fill #0
  Filled 2022-04-21: qty 30, 30d supply, fill #0

## 2022-03-18 MED ORDER — ATORVASTATIN CALCIUM 20 MG PO TABS
20.0000 mg | ORAL_TABLET | Freq: Every day | ORAL | 2 refills | Status: DC
  Filled 2022-03-18: qty 90, 90d supply, fill #0

## 2022-03-18 MED ORDER — TECHLITE PEN NEEDLES 32G X 4 MM MISC
6 refills | Status: DC
  Filled 2022-03-18: qty 100, 25d supply, fill #0
  Filled 2022-05-19: qty 100, 25d supply, fill #1

## 2022-03-18 MED ORDER — METFORMIN HCL 1000 MG PO TABS
ORAL_TABLET | Freq: Two times a day (BID) | ORAL | 3 refills | Status: DC
  Filled 2022-03-18: qty 180, fill #0
  Filled 2022-04-21: qty 180, 90d supply, fill #0
  Filled 2022-07-08: qty 180, 90d supply, fill #1

## 2022-03-18 MED ORDER — FERROUS SULFATE 325 (65 FE) MG PO TABS
325.0000 mg | ORAL_TABLET | Freq: Every day | ORAL | 4 refills | Status: DC
  Filled 2022-03-18: qty 30, 30d supply, fill #0
  Filled 2022-05-19: qty 30, 30d supply, fill #1
  Filled 2022-06-18: qty 30, 30d supply, fill #2

## 2022-03-18 NOTE — Progress Notes (Signed)
Established Patient Office Visit  Subjective   Patient ID: John Meza, male    DOB: [birth year]  Age: 61 y.o. 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  He has had sacral decubiti which have now resolved.  He still has his original wheelchair and is not in good working order and needs a new wheelchair.  We attempted to order this 6 months ago but there was no processing of the order as he is uninsured.   Note the patient did undergo an eye exam on 14 January at the [facility] and it was negative for diabetic retinopathy.  His A1c is 8.0.  His blood sugars at home of been in the 113 130 range.  Blood sugar today is 83.  He does need lab screenings today including liver function renal function.   The patient has no other real complaints except he has occasional back spasms when he lays flat in the bed.  On arrival blood pressure is good 130/80.   4/24 Patient returns in follow-up and now has a new wheelchair with adequate padding.  Sacral decubiti this was provided with the patient assistance line.  This visit was assisted by [place] video interpreter [name] [phone].  On arrival blood sugar is 97 and he brings his glucometer readings which range from 160-98 with an average of about 130.  Unfortunately despite this his A1c is 8.3 so he clearly has glycemic episodes at times.   He has been maintaining Sitagliptin and metformin along with insulin glargine.  Patient does need refills on medications.  Blood pressure is excellent on arrival 138/74.   Patient does complain of some blood in his urine when he self catheterizes himself he does have follow-up appointment with urology upcoming.  Patient has no  other real complaints.  6/7 Post hosp TOC This patient was seen in return follow-up and is a transition of care visit.  He was hospitalized in May with urosepsis and extended-spectrum bacterial beta-lactamase infection E. Coli  Patient received a full 2-week course of intravenous therapy and had his midline catheter removed this week.  Venous Doppler ultrasound of the upper extremity on the right was normal.  Patient currently states he is improving and has no real specific complaints.  He is paraplegic he does self catheterize.  He follows with urology.  The patient does not have any insurance.  He has been using the same 3 catheters on a cyclic basis and does self catheterize 3 times daily.  He is using soap and water to clean the catheters.  He got a fresh set of 4 catheters about a week ago at urology and they have asked to reuse those for the entire year.  He does not have insurance to purchase catheter supplies.  He is due a urine microalbumin study.  Patient does have diabetes and is taking his current medications.  Patient is on the metformin at 1000 mg twice a day and insulin Basaglar 24 units daily atorvastatin 20 mg daily valsartan HCT 320/25 daily and Januvia daily  Patient states that he is accessing his medications.  Blood sugar today on arrival is slightly elevated There are no other complaints.  Below is a copy of the discharge summary and also  the infectious disease note this week, there was a transition of care visit from the RN that occurred May 26 Note there was a transition of care visit for this patient shortly after the discharge admit date: 02/28/2022 Discharge date:  03/05/2022   Admitted From: Home Disposition:  Home Discharging physician: Dwyane Dee, MD   Recommendations for Outpatient Follow-up:  Continue ertapenem on discharge; course to complete on 6/3  Start on oral iron after abx completion    Home Health: RN Equipment/Devices:    Discharge Condition:  stable CODE STATUS: Full Diet recommendation:  Diet Orders (From admission, onward)        Start     Ordered    03/05/22 0000   Diet Carb Modified        03/05/22 1334    02/28/22 1551   Diet Carb Modified Fluid consistency: Thin; Room service appropriate? Yes  Diet effective now       Question Answer Comment  Diet-HS Snack? Nothing    Calorie Level Medium 1600-2000    Fluid consistency: Thin    Room service appropriate? Yes       02/28/22 1551                  Hospital Course: [name] 61 y.o. male with history of DM, HTN, and paraplegia with buttocks/sacral pressure ulcers, neurogenic bladder dependent on self-catheterization who presented with chills and concern for recurrent UTI.  Work-up was notable for ESBL E. coli bacteremia.  He was followed by infectious disease during hospitalization.  He was treated with meropenem with plans for transitioning to ertapenem to complete course at discharge.   Assessment and Plan:   Sepsis POA 2/2 ESBL E Coli bacteremia due to UTI -Febrile, tachycardia, tachypnea -Treated with meropenem during hospitalization.  ID following as well, appreciate assistance - Underwent midline placement on 5/25. HHRN arranged prior to d/c -Continue ertapenem at discharge to complete course on 03/14/2022   HLD  -Continue Lipitor   Hyponatremia -Stable   Iron deficiency anemia  -Iron stores low - Can initiate oral iron after antibiotic completion   Paraplegia Neurogenic bladder Neurogenic bowel Wheelchair-bound: Cont supportive care, patient does intermittent self-catheterization at home, likely contributing to his UTI and sepsis. is advised to followup with urology and advised to maintain hygiene with catheter.   Insulin dependent type 2 diabetes mellitus, with uncontrolled hyperglycemia  -A1c 8.3% on admission - Continue SSI and CBG monitoring   Essential hypertension: BP stable, cont Valsartan/HCTZ   Class I Obesity:Body mass index  is 32.92 kg/m. : Will benefit with PCP follow-up, weight loss  healthy lifestyle and outpatient sleep evaluation     The patient's chronic medical conditions were treated accordingly per the patient's home medication regimen except as noted.  On day of discharge, patient was felt deemed stable for discharge. Patient/family member advised to call PCP or come back to ER if needed.    Principal Diagnosis: UTI due to extended-spectrum beta lactamase (ESBL) producing Escherichia coli   Discharge Diagnoses: Principal Problem:   UTI due to extended-spectrum beta lactamase (ESBL) producing Escherichia coli Active Problems:   HLD (hyperlipidemia)   Paraplegia (HCC)   Neurogenic bladder   Neurogenic bowel   Insulin dependent type 2 diabetes mellitus, controlled ([place])   Hyponatremia   Essential hypertension   Wheelchair dependence   Sepsis due to Escherichia coli (E. coli) (HCC)   Normocytic anemia   RCID ov 6/5 Assessment/Plan B/L pyelonephritis ESBL E coli bacteremia -Completed 2  weeks of carbapenem on 03/14/22   Medication monitoring  5/30 wbc 3.1, hb 9.2, plts 446, ALP 252, ast 50, alt 44, ESR 73, CRP 205, K 9.1( was not notified)   Hyperkalemia  BMP today   Midline - pain and tenderness+ - remove midline - Venous duplex of rt UE   Chills/shaking Blood cx * 2 CBC, ESR and CRP    ROS    Objective:     BP 117/72   Pulse 99   SpO2 98%    Physical Exam   Results for orders placed or performed in visit on 03/18/22  POCT glucose (manual entry)  Result Value Ref Range   POC Glucose 161 (A) 70 - 99 mg/dl      The ASCVD Risk score (Arnett DK, et al., 2019) failed to calculate for the following reasons:   The valid total cholesterol range is 130 to 320 mg/dL    Assessment & Plan:   Problem List Items Addressed This Visit       Cardiovascular and Mediastinum   Essential hypertension    Blood pressure at goal continue valsartan HCT electrolytes have just been  checked by infectious disease renal function stable potassium back to normal         Endocrine   Insulin dependent type 2 diabetes mellitus, controlled (HCC) - Primary    Continue insulin glargine Januvia and metformin as prescribed refills available  Patient will bring sample from home of urine at the next self-catheterization event so we can analyze urine microalbumin       Relevant Orders   POCT glucose (manual entry) (Completed)   Urine microalbumin-creatinine with uACR     Genitourinary   UTI due to extended-spectrum beta lactamase (ESBL) producing Escherichia coli    Urinary tract infection with sepsis and pyelonephritis due to ESBL status post therapy  I am concerned this patient will have recurrent urinary tract infections as he is using the same catheters to self catheterize continuously  We will look into see if we can provide patient assistance to obtain catheters for him       Relevant Orders   Urinalysis   Urine Culture   Pyelonephritis due to Escherichia coli    As per urinary tract infection assessment  Antibiotics have been completed midline catheter removed  Blood cultures obtained this week at infectious disease are no growth  Last patient to submit urinalysis and urine culture with the next self-catheterization event         Other   HLD (hyperlipidemia)    Continue statin       Hyponatremia    Still present we will monitor       Bacteremia    This has resolved blood cultures - June 5       ESBL (extended spectrum beta-lactamase) producing bacteria infection    We will check urine culture       RESOLVED: Sepsis due to Escherichia coli (E. coli) ([place])    Sepsis has resolved       RESOLVED: PICC (peripherally inserted central catheter) in place    This has been removed no DVT seen       RESOLVED: Hyperkalemia    This has resolved      45 minutes spent needing to collaborate with nurse case management obtaining history and  physical reviewing all records from recent hospitalizations and post hospital records  Return in about 2 months (around 05/18/2022).    Asencion Noble, MD

## 2022-03-18 NOTE — Assessment & Plan Note (Signed)
Continue statin. 

## 2022-03-18 NOTE — Assessment & Plan Note (Signed)
This has resolved.

## 2022-03-18 NOTE — Telephone Encounter (Signed)
I spoke to [employer] regarding the patient's need for supplies for self- catheterization.  I explained that the patient is uninsured and unable to afford the catheters and supplies.  We are going to request funding from Patient Assistance Fund to pay for these supplies for the patient but we need to have an estimate of the cost to submit with the request.  Annie Main said they would reach out to the patient to inquire what catheters  he prefers and send him some samples.  In the meantime he will work on getting [place] a cost estimate.   Annie Main was provided with the patient's contact information  and it was noted that a Spanish interpreter is needed.

## 2022-03-18 NOTE — Assessment & Plan Note (Signed)
As per urinary tract infection assessment  Antibiotics have been completed midline catheter removed  Blood cultures obtained this week at infectious disease are no growth  Last patient to submit urinalysis and urine culture with the next self-catheterization event

## 2022-03-18 NOTE — Assessment & Plan Note (Signed)
Sepsis has resolved ?

## 2022-03-18 NOTE — Assessment & Plan Note (Signed)
Blood pressure at goal continue valsartan HCT electrolytes have just been checked by infectious disease renal function stable potassium back to normal

## 2022-03-18 NOTE — Assessment & Plan Note (Signed)
Urinary tract infection with sepsis and pyelonephritis due to ESBL status post therapy  I am concerned this patient will have recurrent urinary tract infections as he is using the same catheters to self catheterize continuously  We will look into see if we can provide patient assistance to obtain catheters for him

## 2022-03-18 NOTE — Assessment & Plan Note (Addendum)
Continue insulin glargine Januvia and metformin as prescribed refills available  Patient will bring sample from home of urine at the next self-catheterization event so we can analyze urine microalbumin

## 2022-03-18 NOTE — Assessment & Plan Note (Signed)
We will check urine culture

## 2022-03-18 NOTE — Patient Instructions (Addendum)
No change in medications  We are working on getting you a supply of catheters  Take a urine specimen cup the next catheterization obtain a urine sample and bring it back for analysis  Refills on all your medications will be provided  Return to see Dr. [name] 2 months  Keep follow-up visits with your urologist as scheduled   Sin cambios en los medicamentos  Estamos trabajando para conseguirle un suministro de catteres.  Tome una copa de [place] de orina la prxima cateterizacin obtenga una [place] de [place] y trigala para su anlisis  Se proporcionarn recargas de todos sus medicamentos.  Volver a ver al Dr. [name] 2 meses  Mantenga las visitas de seguimiento con su urlogo segn lo [employer]

## 2022-03-18 NOTE — Assessment & Plan Note (Signed)
This has been removed no DVT seen

## 2022-03-18 NOTE — Assessment & Plan Note (Signed)
This has resolved blood cultures - June 5

## 2022-03-18 NOTE — Assessment & Plan Note (Signed)
Still present we will monitor

## 2022-03-19 ENCOUNTER — Emergency Department (HOSPITAL_COMMUNITY): Payer: Medicaid Other

## 2022-03-19 ENCOUNTER — Inpatient Hospital Stay (HOSPITAL_COMMUNITY)
Admission: EM | Admit: 2022-03-19 | Discharge: 2022-03-30 | DRG: 698 | Disposition: A | Discharge status: Home Health | Payer: Medicaid Other | Attending: Internal Medicine | Admitting: Internal Medicine

## 2022-03-19 ENCOUNTER — Other Ambulatory Visit: Payer: Self-pay

## 2022-03-19 ENCOUNTER — Encounter (HOSPITAL_COMMUNITY): Payer: Self-pay

## 2022-03-19 DIAGNOSIS — Z1612 Extended spectrum beta lactamase (ESBL) resistance: Secondary | ICD-10-CM | POA: Diagnosis present

## 2022-03-19 DIAGNOSIS — Z87891 Personal history of nicotine dependence: Secondary | ICD-10-CM

## 2022-03-19 DIAGNOSIS — N390 Urinary tract infection, site not specified: Secondary | ICD-10-CM | POA: Diagnosis present

## 2022-03-19 DIAGNOSIS — E785 Hyperlipidemia, unspecified: Secondary | ICD-10-CM | POA: Diagnosis present

## 2022-03-19 DIAGNOSIS — A4151 Sepsis due to Escherichia coli [E. coli]: Secondary | ICD-10-CM | POA: Diagnosis present

## 2022-03-19 DIAGNOSIS — A419 Sepsis, unspecified organism: Secondary | ICD-10-CM | POA: Diagnosis present

## 2022-03-19 DIAGNOSIS — A499 Bacterial infection, unspecified: Principal | ICD-10-CM

## 2022-03-19 DIAGNOSIS — N179 Acute kidney failure, unspecified: Secondary | ICD-10-CM | POA: Diagnosis present

## 2022-03-19 DIAGNOSIS — N319 Neuromuscular dysfunction of bladder, unspecified: Secondary | ICD-10-CM | POA: Diagnosis present

## 2022-03-19 DIAGNOSIS — Z20822 Contact with and (suspected) exposure to covid-19: Secondary | ICD-10-CM | POA: Diagnosis present

## 2022-03-19 DIAGNOSIS — Z79899 Other long term (current) drug therapy: Secondary | ICD-10-CM

## 2022-03-19 DIAGNOSIS — Z7984 Long term (current) use of oral hypoglycemic drugs: Secondary | ICD-10-CM

## 2022-03-19 DIAGNOSIS — L89319 Pressure ulcer of right buttock, unspecified stage: Secondary | ICD-10-CM | POA: Diagnosis present

## 2022-03-19 DIAGNOSIS — S72001A Fracture of unspecified part of neck of right femur, initial encounter for closed fracture: Secondary | ICD-10-CM | POA: Diagnosis present

## 2022-03-19 DIAGNOSIS — I959 Hypotension, unspecified: Secondary | ICD-10-CM | POA: Diagnosis present

## 2022-03-19 DIAGNOSIS — G8220 Paraplegia, unspecified: Secondary | ICD-10-CM | POA: Diagnosis present

## 2022-03-19 DIAGNOSIS — R6520 Severe sepsis without septic shock: Secondary | ICD-10-CM | POA: Diagnosis present

## 2022-03-19 DIAGNOSIS — Z993 Dependence on wheelchair: Secondary | ICD-10-CM

## 2022-03-19 DIAGNOSIS — R7881 Bacteremia: Secondary | ICD-10-CM

## 2022-03-19 DIAGNOSIS — M609 Myositis, unspecified: Secondary | ICD-10-CM | POA: Diagnosis present

## 2022-03-19 DIAGNOSIS — Z6832 Body mass index (BMI) 32.0-32.9, adult: Secondary | ICD-10-CM

## 2022-03-19 DIAGNOSIS — Z7401 Bed confinement status: Secondary | ICD-10-CM

## 2022-03-19 DIAGNOSIS — Z794 Long term (current) use of insulin: Secondary | ICD-10-CM

## 2022-03-19 DIAGNOSIS — E669 Obesity, unspecified: Secondary | ICD-10-CM | POA: Diagnosis present

## 2022-03-19 DIAGNOSIS — D638 Anemia in other chronic diseases classified elsewhere: Secondary | ICD-10-CM | POA: Diagnosis present

## 2022-03-19 DIAGNOSIS — E1165 Type 2 diabetes mellitus with hyperglycemia: Secondary | ICD-10-CM | POA: Diagnosis present

## 2022-03-19 DIAGNOSIS — N3090 Cystitis, unspecified without hematuria: Secondary | ICD-10-CM | POA: Diagnosis present

## 2022-03-19 DIAGNOSIS — E119 Type 2 diabetes mellitus without complications: Secondary | ICD-10-CM

## 2022-03-19 DIAGNOSIS — T83518A Infection and inflammatory reaction due to other urinary catheter, initial encounter: Principal | ICD-10-CM | POA: Diagnosis present

## 2022-03-19 DIAGNOSIS — Z8619 Personal history of other infectious and parasitic diseases: Secondary | ICD-10-CM

## 2022-03-19 DIAGNOSIS — Z881 Allergy status to other antibiotic agents status: Secondary | ICD-10-CM

## 2022-03-19 DIAGNOSIS — L89329 Pressure ulcer of left buttock, unspecified stage: Secondary | ICD-10-CM | POA: Diagnosis present

## 2022-03-19 DIAGNOSIS — Z833 Family history of diabetes mellitus: Secondary | ICD-10-CM

## 2022-03-19 DIAGNOSIS — I10 Essential (primary) hypertension: Secondary | ICD-10-CM | POA: Diagnosis present

## 2022-03-19 DIAGNOSIS — L89159 Pressure ulcer of sacral region, unspecified stage: Secondary | ICD-10-CM | POA: Diagnosis present

## 2022-03-19 DIAGNOSIS — N323 Diverticulum of bladder: Secondary | ICD-10-CM | POA: Diagnosis present

## 2022-03-19 LAB — URINALYSIS, ROUTINE W REFLEX MICROSCOPIC
Bilirubin Urine: NEGATIVE
Cellular Cast, UA: 4
Glucose, UA: 50 mg/dL — AB
Ketones, ur: NEGATIVE mg/dL
Nitrite: NEGATIVE
Protein, ur: 100 mg/dL — AB
RBC / HPF: >50 RBC/hpf — ABNORMAL HIGH (ref 0–5)
Specific Gravity, Urine: 1.013 (ref 1.005–1.030)
pH: 5 (ref 5.0–8.0)

## 2022-03-19 LAB — CBC WITH DIFFERENTIAL/PLATELET
Abs Immature Granulocytes: 0.14 10*3/uL — ABNORMAL HIGH (ref 0.00–0.07)
Basophils Absolute: 0 10*3/uL (ref 0.0–0.1)
Basophils Relative: 0 %
Eosinophils Absolute: 0 10*3/uL (ref 0.0–0.5)
Eosinophils Relative: 0 %
HCT: 29.3 % — ABNORMAL LOW (ref 39.0–52.0)
Hemoglobin: 9.5 g/dL — ABNORMAL LOW (ref 13.0–17.0)
Immature Granulocytes: 1 %
Lymphocytes Relative: 2 %
Lymphs Abs: 0.4 10*3/uL — ABNORMAL LOW (ref 0.7–4.0)
MCH: 27.6 pg (ref 26.0–34.0)
MCHC: 32.4 g/dL (ref 30.0–36.0)
MCV: 85.2 fL (ref 80.0–100.0)
Monocytes Absolute: 0.5 10*3/uL (ref 0.1–1.0)
Monocytes Relative: 3 %
Neutro Abs: 17.3 10*3/uL — ABNORMAL HIGH (ref 1.7–7.7)
Neutrophils Relative %: 94 %
Platelets: 428 10*3/uL — ABNORMAL HIGH (ref 150–400)
RBC: 3.44 MIL/uL — ABNORMAL LOW (ref 4.22–5.81)
RDW: 13.5 % (ref 11.5–15.5)
WBC: 18.3 10*3/uL — ABNORMAL HIGH (ref 4.0–10.5)
nRBC: 0 % (ref 0.0–0.2)

## 2022-03-19 LAB — PROTIME-INR
INR: 1.1 (ref 0.8–1.2)
Prothrombin Time: 14.5 seconds (ref 11.4–15.2)

## 2022-03-19 LAB — COMPREHENSIVE METABOLIC PANEL
ALT: 30 U/L (ref 0–44)
AST: 24 U/L (ref 15–41)
Albumin: 2.2 g/dL — ABNORMAL LOW (ref 3.5–5.0)
Alkaline Phosphatase: 181 U/L — ABNORMAL HIGH (ref 38–126)
Anion gap: 14 (ref 5–15)
BUN: 21 mg/dL — ABNORMAL HIGH (ref 6–20)
CO2: 18 mmol/L — ABNORMAL LOW (ref 22–32)
Calcium: 8.4 mg/dL — ABNORMAL LOW (ref 8.9–10.3)
Chloride: 100 mmol/L (ref 98–111)
Creatinine, Ser: 1.52 mg/dL — ABNORMAL HIGH (ref 0.61–1.24)
GFR, Estimated: 53 mL/min — ABNORMAL LOW (ref >60)
Glucose, Bld: 168 mg/dL — ABNORMAL HIGH (ref 70–99)
Potassium: 4.3 mmol/L (ref 3.5–5.1)
Sodium: 132 mmol/L — ABNORMAL LOW (ref 135–145)
Total Bilirubin: 1 mg/dL (ref 0.3–1.2)
Total Protein: 7.2 g/dL (ref 6.5–8.1)

## 2022-03-19 LAB — SARS CORONAVIRUS 2 BY RT PCR: SARS Coronavirus 2 by RT PCR: NEGATIVE

## 2022-03-19 LAB — LACTIC ACID, PLASMA: Lactic Acid, Venous: 1.9 mmol/L (ref 0.5–1.9)

## 2022-03-19 LAB — APTT: aPTT: 40 seconds — ABNORMAL HIGH (ref 24–36)

## 2022-03-19 MED ORDER — VANCOMYCIN HCL 1750 MG/350ML IV SOLN: 1750.0000 mg | INTRAVENOUS | Status: DC

## 2022-03-19 MED ORDER — VANCOMYCIN HCL 1750 MG/350ML IV SOLN
1750.0000 mg | Freq: Once | INTRAVENOUS | Status: AC
  Administered 2022-03-19: 1750 mg via INTRAVENOUS
  Filled 2022-03-19: qty 350

## 2022-03-19 MED ORDER — FERROUS SULFATE 325 (65 FE) MG PO TABS
325.0000 mg | ORAL_TABLET | Freq: Every day | ORAL | Status: DC
  Administered 2022-03-20 – 2022-03-30 (×11): 325 mg via ORAL
  Filled 2022-03-19 (×11): qty 1

## 2022-03-19 MED ORDER — LACTATED RINGERS IV BOLUS
2000.0000 mL | Freq: Once | INTRAVENOUS | Status: AC
  Administered 2022-03-19: 2000 mL via INTRAVENOUS

## 2022-03-19 MED ORDER — LACTATED RINGERS IV SOLN: INTRAVENOUS | Status: DC

## 2022-03-19 MED ORDER — ACETAMINOPHEN 325 MG PO TABS
650.0000 mg | ORAL_TABLET | Freq: Four times a day (QID) | ORAL | Status: DC | PRN
  Administered 2022-03-20 – 2022-03-24 (×5): 650 mg via ORAL
  Filled 2022-03-19 (×5): qty 2

## 2022-03-19 MED ORDER — INSULIN GLARGINE-YFGN 100 UNIT/ML ~~LOC~~ SOLN
15.0000 [IU] | Freq: Every day | SUBCUTANEOUS | Status: DC
  Administered 2022-03-20 – 2022-03-29 (×10): 15 [IU] via SUBCUTANEOUS
  Filled 2022-03-19 (×12): qty 0.15

## 2022-03-19 MED ORDER — INSULIN ASPART 100 UNIT/ML IJ SOLN
0.0000 [IU] | Freq: Three times a day (TID) | INTRAMUSCULAR | Status: DC
  Administered 2022-03-20 – 2022-03-21 (×3): 1 [IU] via SUBCUTANEOUS
  Administered 2022-03-21 – 2022-03-22 (×4): 2 [IU] via SUBCUTANEOUS
  Administered 2022-03-22: 1 [IU] via SUBCUTANEOUS
  Administered 2022-03-23: 5 [IU] via SUBCUTANEOUS
  Administered 2022-03-23: 2 [IU] via SUBCUTANEOUS
  Administered 2022-03-23: 1 [IU] via SUBCUTANEOUS
  Administered 2022-03-24: 2 [IU] via SUBCUTANEOUS
  Administered 2022-03-24: 3 [IU] via SUBCUTANEOUS
  Administered 2022-03-24 – 2022-03-25 (×2): 1 [IU] via SUBCUTANEOUS
  Administered 2022-03-25: 2 [IU] via SUBCUTANEOUS
  Administered 2022-03-25 – 2022-03-26 (×2): 3 [IU] via SUBCUTANEOUS
  Administered 2022-03-26 – 2022-03-28 (×4): 2 [IU] via SUBCUTANEOUS
  Administered 2022-03-28: 1 [IU] via SUBCUTANEOUS
  Administered 2022-03-29: 3 [IU] via SUBCUTANEOUS
  Administered 2022-03-29 – 2022-03-30 (×4): 2 [IU] via SUBCUTANEOUS

## 2022-03-19 MED ORDER — ATORVASTATIN CALCIUM 10 MG PO TABS
20.0000 mg | ORAL_TABLET | Freq: Every day | ORAL | Status: DC
  Administered 2022-03-20 – 2022-03-30 (×11): 20 mg via ORAL
  Filled 2022-03-19 (×11): qty 2

## 2022-03-19 MED ORDER — SODIUM CHLORIDE 0.9 % IV BOLUS: 1000.0000 mL | Freq: Once | INTRAVENOUS | Status: DC

## 2022-03-19 MED ORDER — ACETAMINOPHEN 500 MG PO TABS
1000.0000 mg | ORAL_TABLET | Freq: Four times a day (QID) | ORAL | Status: DC | PRN
  Administered 2022-03-19: 1000 mg via ORAL
  Filled 2022-03-19: qty 2

## 2022-03-19 MED ORDER — HYDRALAZINE HCL 20 MG/ML IJ SOLN: 10.0000 mg | INTRAMUSCULAR | Status: DC | PRN

## 2022-03-19 MED ORDER — SODIUM CHLORIDE 0.9 % IV BOLUS
1000.0000 mL | Freq: Once | INTRAVENOUS | Status: AC
  Administered 2022-03-19: 1000 mL via INTRAVENOUS

## 2022-03-19 MED ORDER — SODIUM CHLORIDE 0.9 % IV SOLN: 2.0000 g | Freq: Once | INTRAVENOUS | Status: DC

## 2022-03-19 MED ORDER — ACETAMINOPHEN 650 MG RE SUPP: 650.0000 mg | Freq: Four times a day (QID) | RECTAL | Status: DC | PRN

## 2022-03-19 MED ORDER — ENOXAPARIN SODIUM 40 MG/0.4ML IJ SOSY
40.0000 mg | PREFILLED_SYRINGE | INTRAMUSCULAR | Status: DC
  Administered 2022-03-20 – 2022-03-30 (×10): 40 mg via SUBCUTANEOUS
  Filled 2022-03-19 (×11): qty 0.4

## 2022-03-19 MED ORDER — SODIUM CHLORIDE 0.9 % IV SOLN
1.0000 g | Freq: Three times a day (TID) | INTRAVENOUS | Status: DC
  Administered 2022-03-19 – 2022-03-30 (×34): 1 g via INTRAVENOUS
  Filled 2022-03-19 (×35): qty 20

## 2022-03-19 MED ORDER — IOHEXOL 350 MG/ML SOLN
80.0000 mL | Freq: Once | INTRAVENOUS | Status: AC | PRN
Start: 2022-03-19 — End: 2022-03-19
  Administered 2022-03-19: 80 mL via INTRAVENOUS

## 2022-03-19 MED ORDER — LACTATED RINGERS IV BOLUS (SEPSIS)
1000.0000 mL | Freq: Once | INTRAVENOUS | Status: AC
  Administered 2022-03-19: 1000 mL via INTRAVENOUS

## 2022-03-19 NOTE — Sepsis Progress Note (Signed)
Secure chat with bedside RN regarding need for abx admin. RN is currently tied up in another pt's room and stated another RN is helping her with this task.

## 2022-03-19 NOTE — ED Triage Notes (Signed)
Patient complains of vomiting and fever that has been recurrent since may.  Patient was on IV antibiotics at home but home health nurse discontinued and patient reports he has been the same.  Patient self caths.  Denies sores on sacrum.  Patient appearspale.

## 2022-03-19 NOTE — H&P (Signed)
History and Physical    Mohanad Carsten XKG:[id number] DOB: [birth year] DOA: 03/19/2022  PCP: Storm Frisk, MD  Patient coming from: Home.  Used Engineer, structural.  Chief Complaint: Fever nausea vomiting.  HPI: Lum Stillinger is a 61 y.o. male with history of diabetes mellitus type 2, hypertension, hyperlipidemia, chronic anemia, paraplegia with neurogenic bladder uses self cath was recently admitted for ESBL E. coli bacteremia was recently on IV antibiotics.  Has been experiencing nausea vomiting fever chills at home for the last week.  Denies any abdominal pain chest pain shortness of breath or productive cough.  ED Course: In the ER patient was hypotensive with systolic blood pressure in the 80s with fever leukocytosis concerning for sepsis.  CT abdomen pelvis shows features concerning for cystitis.  Also shows acute right hip fracture.  Patient was resuscitated with fluids following which blood pressure improved.  Also shows acute renal failure.  Empiric antibiotics was started and UA is concerning for UTI.  Admitted for sepsis likely from UTI.  Review of Systems: As per HPI, rest all negative.   Past Medical History:  Diagnosis Date   Cellulitis and abscess of buttock 10/2016   Diabetes mellitus    Hyperkalemia 03/16/2022   Hypertension    Paraplegia (HCC) 2013   fell from ladder   Pressure ulcer, buttock, right, unstageable (HCC) 12/30/2020   Sacral decubitus ulcer, stage IV (HCC) 11/03/2021   SCI (spinal cord injury)    Sepsis due to Escherichia coli (E. coli) (HCC) 03/01/2022   Spine fracture 11/16/2011   T 11- T9-L1    Past Surgical History:  Procedure Laterality Date   CHOLECYSTECTOMY N/A 05/01/2015   Procedure: LAPAROSCOPIC CHOLECYSTECTOMY WITH ATTEMPTED INTRAOPERATIVE CHOLANGIOGRAM;  Surgeon: Violeta Gelinas, MD;  Location: [facility] OR;  Service: General;  Laterality: N/A;   SPINE SURGERY       reports that he quit smoking about 10 years ago. His  smoking use included cigarettes. He has a 5.00 pack-year smoking history. He has never used smokeless tobacco. He reports that he does not currently use alcohol after a past usage of about 1.0 standard drink of alcohol per week. He reports that he does not use drugs.  Allergies  Allergen Reactions   Macrobid [Nitrofurantoin] Itching and Rash    Family History  Problem Relation Age of Onset   Healthy Mother    Diabetes Father    Diabetes Sister     Prior to Admission medications   Medication Sig Start Date End Date Taking? Authorizing Provider  atorvastatin (LIPITOR) 20 MG tablet Take 1 tablet (20 mg total) by mouth daily at 12 noon. 03/18/22 03/18/23  Storm Frisk, MD  ferrous sulfate 325 (65 FE) MG tablet Take 1 tablet (325 mg total) by mouth daily. 03/18/22   Storm Frisk, MD  Insulin Glargine [facility] KWIKPEN) 100 UNIT/ML Inject 25 Units into the skin at bedtime. 02/02/22 02/02/23  Storm Frisk, MD  Insulin Pen Needle (TECHLITE PEN NEEDLES) 32G X 4 MM MISC Use as directed to inject insulin 03/18/22   Storm Frisk, MD  metFORMIN (GLUCOPHAGE) 1000 MG tablet TAKE 1 TABLET (1,000 MG TOTAL) BY MOUTH 2 (TWO) TIMES DAILY WITH A MEAL. 03/18/22 03/18/23  Storm Frisk, MD  methocarbamol (ROBAXIN) 500 MG tablet Take 1 tablet (500 mg total) by mouth every 6 (six) hours as needed for muscle spasms. 11/03/21   Storm Frisk, MD  omeprazole (PRILOSEC) 20 MG capsule Take 1 capsule by mouth daily  to reduce stomach acid Patient taking differently: Take 20 mg by mouth daily as needed (acid reflux/indigestion). 11/03/21 11/03/22  Storm Frisk, MD  sitaGLIPtin (JANUVIA) 100 MG tablet Take 1 tablet by mouth daily 03/18/22   Storm Frisk, MD  valsartan-hydrochlorothiazide (DIOVAN-HCT) 320-25 MG tablet Take 1 tablet by mouth daily. 03/18/22   Storm Frisk, MD  loratadine (CLARITIN) 10 MG tablet Take 1 tablet (10 mg total) by mouth daily. As needed for itchy throat/allergy  symptoms Patient not taking: Reported on 03/24/2019 12/26/18 07/05/20  Cain Saupe, MD    Physical Exam: Constitutional: Moderately built and nourished. Vitals:   03/19/22 2030 03/19/22 2045 03/19/22 2130 03/19/22 2145  BP:  109/75 113/71 112/75  Pulse: (!) 106 (!) 101 (!) 103 (!) 103  Resp: 18 18 19 20   Temp: 98.1 F (36.7 C)     TempSrc: Oral     SpO2: 97% 99% 99% 99%   Eyes: Anicteric no pallor. ENMT: No discharge from the ears eyes nose and mouth. Neck: No mass felt.  No neck rigidity. Respiratory: No rhonchi or crepitations. Cardiovascular: S1-S2 heard. Abdomen: Soft nontender bowel sound present. Musculoskeletal: No edema. Skin: No decubitus ulcers seen at this time. Neurologic: Alert awake oriented time place and person.  Paraplegic. Psychiatric: Appears normal.   Labs on Admission: I have personally reviewed following labs and imaging studies  CBC: Recent Labs  Lab 03/16/22 1024 03/19/22 1612  WBC 12.9* 18.3*  NEUTROABS  --  17.3*  HGB 8.9* 9.5*  HCT 27.5* 29.3*  MCV 85.1 85.2  PLT 567* 428*   Basic Metabolic Panel: Recent Labs  Lab 03/16/22 1024 03/19/22 1612  NA 128* 132*  K 4.5 4.3  CL 96* 100  CO2 19* 18*  GLUCOSE 176* 168*  BUN 20 21*  CREATININE 1.10 1.52*  CALCIUM 8.5* 8.4*   GFR: CrCl cannot be calculated (Unknown ideal weight.). Liver Function Tests: Recent Labs  Lab 03/19/22 1612  AST 24  ALT 30  ALKPHOS 181*  BILITOT 1.0  PROT 7.2  ALBUMIN 2.2*   No results for input(s): "LIPASE", "AMYLASE" in the last 168 hours. No results for input(s): "AMMONIA" in the last 168 hours. Coagulation Profile: Recent Labs  Lab 03/19/22 1612  INR 1.1   Cardiac Enzymes: No results for input(s): "CKTOTAL", "CKMB", "CKMBINDEX", "TROPONINI" in the last 168 hours. BNP (last 3 results) No results for input(s): "PROBNP" in the last 8760 hours. HbA1C: No results for input(s): "HGBA1C" in the last 72 hours. CBG: No results for input(s): "GLUCAP"  in the last 168 hours. Lipid Profile: No results for input(s): "CHOL", "HDL", "LDLCALC", "TRIG", "CHOLHDL", "LDLDIRECT" in the last 72 hours. Thyroid Function Tests: No results for input(s): "TSH", "T4TOTAL", "FREET4", "T3FREE", "THYROIDAB" in the last 72 hours. Anemia Panel: No results for input(s): "VITAMINB12", "FOLATE", "FERRITIN", "TIBC", "IRON", "RETICCTPCT" in the last 72 hours. Urine analysis:    Component Value Date/Time   COLORURINE YELLOW 03/19/2022 1917   APPEARANCEUR CLOUDY (A) 03/19/2022 1917   APPEARANCEUR Cloudy (A) 09/15/2019 1014   LABSPEC 1.013 03/19/2022 1917   PHURINE 5.0 03/19/2022 1917   GLUCOSEU 50 (A) 03/19/2022 1917   HGBUR SMALL (A) 03/19/2022 1917   BILIRUBINUR NEGATIVE 03/19/2022 1917   BILIRUBINUR Negative 09/15/2019 1014   KETONESUR NEGATIVE 03/19/2022 1917   PROTEINUR 100 (A) 03/19/2022 1917   UROBILINOGEN 1.0 02/28/2022 1240   NITRITE NEGATIVE 03/19/2022 1917   LEUKOCYTESUR LARGE (A) 03/19/2022 1917   Sepsis Labs: @LABRCNTIP (procalcitonin:4,lacticidven:4) ) Recent Results (from  the past 240 hour(s))  Blood culture (routine single)     Status: None (Preliminary result)   Collection Time: 03/16/22 10:24 AM   Specimen: Blood  Result Value Ref Range Status   MICRO NUMBER: [id number]  Preliminary   SPECIMEN QUALITY: Suboptimal  Preliminary   Source BLOOD 1  Preliminary   STATUS: PRELIMINARY  Preliminary   Result:   Preliminary    No growth to date. Culture is continuously monitored for a total of 120 hours incubation. A change in status will result in a phone report followed by an updated printed culture report. Inspection of blood culture bottles indicates that an inadequate  volume of blood may have been collected for the detection of sepsis.    COMMENT: Aerobic and anaerobic bottle received.  Preliminary  Blood culture (routine single)     Status: None (Preliminary result)   Collection Time: 03/16/22 10:24 AM   Specimen: Blood  Result Value  Ref Range Status   MICRO NUMBER: [id number]  Preliminary   SPECIMEN QUALITY: Adequate  Preliminary   Source BLOOD 2  Preliminary   STATUS: PRELIMINARY  Preliminary   Result:   Preliminary    No growth to date. Culture is continuously monitored for a total of 120 hours incubation. A change in status will result in a phone report followed by an updated printed culture report.   COMMENT: Aerobic and anaerobic bottle received.  Preliminary  SARS Coronavirus 2 by RT PCR (hospital order, performed in [facility] hospital lab) *cepheid single result test* Anterior Nasal Swab     Status: None   Collection Time: 03/19/22  4:28 PM   Specimen: Anterior Nasal Swab  Result Value Ref Range Status   SARS Coronavirus 2 by RT PCR NEGATIVE NEGATIVE Final    Comment: (NOTE) SARS-CoV-2 target nucleic acids are NOT DETECTED.  The SARS-CoV-2 RNA is generally detectable in upper and lower respiratory specimens during the acute phase of infection. The lowest concentration of SARS-CoV-2 viral copies this assay can detect is 250 copies / mL. A negative result does not preclude SARS-CoV-2 infection and should not be used as the sole basis for treatment or other patient management decisions.  A negative result may occur with improper specimen collection / handling, submission of specimen other than nasopharyngeal swab, presence of viral mutation(s) within the areas targeted by this assay, and inadequate number of viral copies (<250 copies / mL). A negative result must be combined with clinical observations, patient history, and epidemiological information.  Fact Sheet for Patients:   RoadLapTop.co.zahttps://www.fda.gov/media/158405/download  Fact Sheet for Healthcare Providers: http://kim-miller.com/https://www.fda.gov/media/158404/download  This test is not yet approved or  cleared by the [place] FDA and has been authorized for detection and/or diagnosis of SARS-CoV-2 by FDA under an Emergency Use Authorization (EUA).  This EUA will  remain in effect (meaning this test can be used) for the duration of the COVID-19 declaration under Section 564(b)(1) of the Act, 21 U.S.C. section 360bbb-3(b)(1), unless the authorization is terminated or revoked sooner.  Performed at [facility], [address] [place], [place] [address]      Radiological Exams on Admission: CT ABDOMEN PELVIS W CONTRAST  Result Date: 03/19/2022 CLINICAL DATA:  Sepsis. EXAM: CT ABDOMEN AND PELVIS WITH CONTRAST TECHNIQUE: Multidetector CT imaging of the abdomen and pelvis was performed using the standard protocol following bolus administration of intravenous contrast. RADIATION DOSE REDUCTION: This exam was performed according to the departmental dose-optimization program which includes automated exposure control, adjustment of the  mA and/or kV according to patient size and/or use of iterative reconstruction technique. CONTRAST:  80mL OMNIPAQUE IOHEXOL 350 MG/ML SOLN COMPARISON:  CT abdomen and pelvis 03/03/2022. FINDINGS: Lower chest: No acute abnormality. Hepatobiliary: Gallbladder surgically absent. 15 mm rounded hypodensity near the gallbladder fossa is unchanged favored as a small cyst. No new liver lesions are identified. No biliary ductal dilatation. Pancreas: Unremarkable. No pancreatic ductal dilatation or surrounding inflammatory changes. Spleen: Normal in size without focal abnormality. Adrenals/Urinary Tract: There is marked diffuse wall thickening of the bladder. 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 There is no hydronephrosis or perinephric fluid. No significant excretion of contrast on delayed imaging. Adrenal glands within normal limits. Stomach/Bowel: Small hiatal hernia is present. Stomach is otherwise within normal limits. Appendix appears normal. No evidence of bowel wall thickening, distention, or inflammatory changes. Vascular/Lymphatic: No significant vascular findings are present. No enlarged abdominal or pelvic lymph nodes.  Reproductive: Prostate is enlarged. Other: Small fat containing left inguinal hernia. Mild presacral edema. No ascites. Musculoskeletal: There is an acute right femoral neck fracture with superolateral displacement of the distal fracture fragment. There is apex anterior angulation. There is joint effusion and small amount of air in the joint space compatible with fracture. There is surrounding soft tissue edema. No dislocation. T9-L1 posterior fusion hardware appears intact. There are healed left-sided rib fractures. IMPRESSION: 1. Acute displaced right femoral neck fracture. 2. Marked diffuse wall thickening of the bladder again seen. This may represent cystitis. Bladder diverticulum again noted. Please correlate clinically. 3. Delayed excretion of contrast from the kidneys. Correlate for medical renal disease. No hydronephrosis. Electronically Signed   By: Darliss Cheney M.D.   On: 03/19/2022 20:26   DG Chest Port 1 View  Result Date: 03/19/2022 CLINICAL DATA:  Questionable sepsis - evaluate for abnormality EXAM: PORTABLE CHEST 1 VIEW COMPARISON:  Feb 28, 2022 FINDINGS: The heart size and mediastinal contours are within normal limits. No focal consolidation, pleural effusion or vascular congestion. There is minor atelectasis seen at the right lung base and is new. There are thoracolumbar fusion changes seen, stable. IMPRESSION: Minor atelectasis at the right lung base. Otherwise the study is unremarkable. Electronically Signed   By: Marjo Bicker M.D.   On: 03/19/2022 18:04    EKG: Independently reviewed.  Sinus tachycardia.  Assessment/Plan Active Problems:   HLD (hyperlipidemia)   Paraplegia (HCC)   Neurogenic bladder   Insulin dependent type 2 diabetes mellitus, controlled (HCC)   Essential hypertension   Lower urinary tract infectious disease   Sepsis (HCC)    Sepsis likely from urinary tract infection for which at this time patient is on empiric antibiotics.  Has had recent ESBL E. coli  bacteremia.  Follow cultures continue hydration. Acute renal failure likely from hypotension and patient also uses ARB.  Hold antihypertensive continue hydration follow metabolic panel intake output. Right hip fracture -Dr. Roda Shutters orthopedic surgeon was consulted by ER physician requested nonsurgical management given the patient's history of paraplegia and being bedbound. Nausea vomiting could be from UTI.  Closely monitor.  CT scan does show cystitis otherwise no definite evidence for cause for vomiting. Hypertension presently hypotensive holding antihypertensives.  As needed IV hydralazine until patient can resume oral antihypertensives once blood pressure stabilizes. Diabetes mellitus type 2 with hyperglycemia I have reduce patient's Lantus dose from 25 units to 15 units due to acute renal failure and also patient had nausea vomiting.  Closely follow CBGs.  Patient's last hemoglobin A1c was 8.1 in November 03, 2021. Chronic anemia follow CBC. Hyperlipidemia on statins. Patient has a PICC line.  If cultures comes positive may need to remove. Prior history of sacral decubitus appears normal at this time.  Since patient is septic at presentation with hypotension fever with acute renal failure will need close monitoring and inpatient status.   DVT prophylaxis: Lovenox. Code Status: Full code. Family Communication: Patient's wife at the bedside. Disposition Plan: Home. Consults called: None. Admission status: Inpatient.   Eduard Clos MD [facility] Pager [phone].  If 7PM-7AM, please contact night-coverage [email or web address] Password [email or web address]  03/19/2022, 10:14 PM

## 2022-03-19 NOTE — Telephone Encounter (Signed)
Message received from Norval Morton stating that they received  a request for supplies as well as clinical information  from [facility].   He will let me know if additional information is needed from PCP.   I again requested the information regarding cost for supplies so we could submit request for financial assistance to pay for the supplies for the patient.

## 2022-03-19 NOTE — Sepsis Progress Note (Addendum)
eLink monitoring code sepsis.  

## 2022-03-19 NOTE — ED Notes (Signed)
Pt changed- brief wet with urine. No sacral wound of breakdown noted on bottom.

## 2022-03-19 NOTE — ED Notes (Signed)
Help get patient undressed into a gown patient is resting with call bell in reach and family at bedside

## 2022-03-19 NOTE — ED Provider Notes (Signed)
[facility] EMERGENCY DEPARTMENT Provider Note   CSN: [id number] Arrival date & time: 03/19/22  1531     History  Chief Complaint  Patient presents with   Code Sepsis    John Meza is a 61 y.o. male paraplegic, wheelchair-bound recent admission for ESBL bacteremia thought due to bilateral pyelonephritis from self-catheterization here for evaluation of feeling unwell.  Finished antibiotics last Saturday.  Had his midline removed.  States few days after started feeling poorly with recurrent fevers, vomiting and shaking chills.  Of note patient was seen at PCP office yesterday and per their note he did not have any complaints.  They thought patient had urosepsis due to reusing his catheterizations as he was uninsured did not have clean Foley catheters.  States he has had multiple episodes of NBNB emesis.  No focal pain.  Normal bowel movements.  Previously had ulceration to sacral region however this has resolved.  He uses a wheelchair at baseline.  He is not mobile at baseline.  Wife present in room for history    HPI     Home Medications Prior to Admission medications   Medication Sig Start Date End Date Taking? Authorizing Provider  atorvastatin (LIPITOR) 20 MG tablet Take 1 tablet (20 mg total) by mouth daily at 12 noon. 03/18/22 03/18/23  [name], MD  ferrous sulfate 325 (65 FE) MG tablet Take 1 tablet (325 mg total) by mouth daily. 03/18/22   [name], MD  Insulin Glargine [facility] KWIKPEN) 100 UNIT/ML Inject 25 Units into the skin at bedtime. 02/02/22 02/02/23  [name], MD  Insulin Pen Needle (TECHLITE PEN NEEDLES) 32G X 4 MM MISC Use as directed to inject insulin 03/18/22   [name], MD  metFORMIN (GLUCOPHAGE) 1000 MG tablet TAKE 1 TABLET (1,000 MG TOTAL) BY MOUTH 2 (TWO) TIMES DAILY WITH A MEAL. 03/18/22 03/18/23  [name], MD  methocarbamol (ROBAXIN) 500 MG tablet Take 1 tablet (500 mg total) by mouth every 6  (six) hours as needed for muscle spasms. 11/03/21   [name], MD  omeprazole (PRILOSEC) 20 MG capsule Take 1 capsule by mouth daily to reduce stomach acid Patient taking differently: Take 20 mg by mouth daily as needed (acid reflux/indigestion). 11/03/21 11/03/22  [name], MD  sitaGLIPtin (JANUVIA) 100 MG tablet Take 1 tablet by mouth daily 03/18/22   [name], MD  valsartan-hydrochlorothiazide (DIOVAN-HCT) 320-25 MG tablet Take 1 tablet by mouth daily. 03/18/22   [name], MD  loratadine (CLARITIN) 10 MG tablet Take 1 tablet (10 mg total) by mouth daily. As needed for itchy throat/allergy symptoms Patient not taking: Reported on 03/24/2019 12/26/18 07/05/20  Antony Blackbird, MD      Allergies    Macrobid [nitrofurantoin]    Review of Systems   Review of Systems  Constitutional:  Positive for chills, fatigue and fever.  HENT: Negative.    Respiratory: Negative.    Cardiovascular: Negative.   Gastrointestinal: Negative.   Genitourinary: Negative.   Musculoskeletal: Negative.   Skin: Negative.   Neurological: Negative.   All other systems reviewed and are negative.   Physical Exam Updated Vital Signs BP 109/75   Pulse (!) 101   Temp 98.1 F (36.7 C) (Oral)   Resp 18   SpO2 99%  Physical Exam Vitals and nursing note reviewed.  Constitutional:      General: He is not in acute distress.    Appearance: He is  well-developed. He is not ill-appearing, toxic-appearing or diaphoretic.  HENT:     Head: Normocephalic and atraumatic.     Jaw: There is normal jaw occlusion.     Nose: Nose normal.     Mouth/Throat:     Mouth: Mucous membranes are dry.  Eyes:     Pupils: Pupils are equal, round, and reactive to light.  Neck:     Trachea: Trachea and phonation normal.     Comments: Full ROM Cardiovascular:     Rate and Rhythm: Regular rhythm. Tachycardia present.     Pulses: Normal pulses.          Radial pulses are 2+ on the right side and 2+ on the left  side.       Dorsalis pedis pulses are 2+ on the right side and 2+ on the left side.     Heart sounds: Normal heart sounds.  Pulmonary:     Effort: Pulmonary effort is normal. No respiratory distress.     Breath sounds: Normal breath sounds and air entry.     Comments: Clear Bilaterally, speaks in full sentences without difficulty Abdominal:     General: Bowel sounds are normal. There is no distension.     Palpations: Abdomen is soft.     Tenderness: There is no abdominal tenderness. There is no right CVA tenderness, left CVA tenderness, guarding or rebound.     Comments: Soft no rebound or guarding  Musculoskeletal:        General: Normal range of motion.     Cervical back: Full passive range of motion without pain, normal range of motion and neck supple.     Comments: No shortening or rotation of legs, no sacral wounds.  Paraplegic bilateral lower extremities umbilicus distally  Skin:    General: Skin is warm and dry.     Capillary Refill: Capillary refill takes less than 2 seconds.     Comments: No rash, lesions, ulcerations  Neurological:     Mental Status: He is alert and oriented to person, place, and time.     Cranial Nerves: Cranial nerves 2-12 are intact.     Comments: Paraplegic> umbilicus distally Equal strength to BUE     ED Results / Procedures / Treatments   Labs (all labs ordered are listed, but only abnormal results are displayed) Labs Reviewed  COMPREHENSIVE METABOLIC PANEL - Abnormal; Notable for the following components:      Result Value   Sodium 132 (*)    CO2 18 (*)    Glucose, Bld 168 (*)    BUN 21 (*)    Creatinine, Ser 1.52 (*)    Calcium 8.4 (*)    Albumin 2.2 (*)    Alkaline Phosphatase 181 (*)    GFR, Estimated 53 (*)    All other components within normal limits  CBC WITH DIFFERENTIAL/PLATELET - Abnormal; Notable for the following components:   WBC 18.3 (*)    RBC 3.44 (*)    Hemoglobin 9.5 (*)    HCT 29.3 (*)    Platelets 428 (*)    Neutro  Abs 17.3 (*)    Lymphs Abs 0.4 (*)    Abs Immature Granulocytes 0.14 (*)    All other components within normal limits  APTT - Abnormal; Notable for the following components:   aPTT 40 (*)    All other components within normal limits  URINALYSIS, ROUTINE W REFLEX MICROSCOPIC - Abnormal; Notable for the following components:   APPearance CLOUDY (*)  Glucose, UA 50 (*)    Hgb urine dipstick SMALL (*)    Protein, ur 100 (*)    Leukocytes,Ua LARGE (*)    RBC / HPF >50 (*)    Bacteria, UA RARE (*)    All other components within normal limits  CULTURE, BLOOD (ROUTINE X 2)  CULTURE, BLOOD (ROUTINE X 2)  URINE CULTURE  SARS CORONAVIRUS 2 BY RT PCR  LACTIC ACID, PLASMA  PROTIME-INR  LACTIC ACID, PLASMA    EKG None  Radiology CT ABDOMEN PELVIS W CONTRAST  Result Date: 03/19/2022 CLINICAL DATA:  Sepsis. EXAM: CT ABDOMEN AND PELVIS WITH CONTRAST TECHNIQUE: Multidetector CT imaging of the abdomen and pelvis was performed using the standard protocol following bolus administration of intravenous contrast. RADIATION DOSE REDUCTION: This exam was performed according to the departmental dose-optimization program which includes automated exposure control, adjustment of the mA and/or kV according to patient size and/or use of iterative reconstruction technique. CONTRAST:  28m OMNIPAQUE IOHEXOL 350 MG/ML SOLN COMPARISON:  CT abdomen and pelvis 03/03/2022. FINDINGS: Lower chest: No acute abnormality. Hepatobiliary: Gallbladder surgically absent. 15 mm rounded hypodensity near the gallbladder fossa is unchanged favored as a small cyst. No new liver lesions are identified. No biliary ductal dilatation. Pancreas: Unremarkable. No pancreatic ductal dilatation or surrounding inflammatory changes. Spleen: Normal in size without focal abnormality. Adrenals/Urinary Tract: There is marked diffuse wall thickening of the bladder. 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 There is no hydronephrosis or  perinephric fluid. No significant excretion of contrast on delayed imaging. Adrenal glands within normal limits. Stomach/Bowel: Small hiatal hernia is present. Stomach is otherwise within normal limits. Appendix appears normal. No evidence of bowel wall thickening, distention, or inflammatory changes. Vascular/Lymphatic: No significant vascular findings are present. No enlarged abdominal or pelvic lymph nodes. Reproductive: Prostate is enlarged. Other: Small fat containing left inguinal hernia. Mild presacral edema. No ascites. Musculoskeletal: There is an acute right femoral neck fracture with superolateral displacement of the distal fracture fragment. There is apex anterior angulation. There is joint effusion and small amount of air in the joint space compatible with fracture. There is surrounding soft tissue edema. No dislocation. T9-L1 posterior fusion hardware appears intact. There are healed left-sided rib fractures. IMPRESSION: 1. Acute displaced right femoral neck fracture. 2. Marked diffuse wall thickening of the bladder again seen. This may represent cystitis. Bladder diverticulum again noted. Please correlate clinically. 3. Delayed excretion of contrast from the kidneys. Correlate for medical renal disease. No hydronephrosis. Electronically Signed   By: ARonney AstersM.D.   On: 03/19/2022 20:26   DG Chest Port 1 View  Result Date: 03/19/2022 CLINICAL DATA:  Questionable sepsis - evaluate for abnormality EXAM: PORTABLE CHEST 1 VIEW COMPARISON:  Feb 28, 2022 FINDINGS: The heart size and mediastinal contours are within normal limits. No focal consolidation, pleural effusion or vascular congestion. There is minor atelectasis seen at the right lung base and is new. There are thoracolumbar fusion changes seen, stable. IMPRESSION: Minor atelectasis at the right lung base. Otherwise the study is unremarkable. Electronically Signed   By: AFrazier RichardsM.D.   On: 03/19/2022 18:04    Procedures .Critical  Care  Performed by: HNettie Elm PA-C Authorized by: HNettie Elm PA-C   Critical care provider statement:    Critical care time (minutes):  45   Critical care was necessary to treat or prevent imminent or life-threatening deterioration of the following conditions:  Sepsis   Critical care was time spent personally by  me on the following activities:  Development of treatment plan with patient or surrogate, discussions with consultants, evaluation of patient's response to treatment, examination of patient, ordering and review of laboratory studies, ordering and review of radiographic studies, ordering and performing treatments and interventions, pulse oximetry, re-evaluation of patient's condition and review of old charts     Medications Ordered in ED Medications  acetaminophen (TYLENOL) tablet 1,000 mg (1,000 mg Oral Given 03/19/22 1617)  lactated ringers infusion (has no administration in time range)  meropenem (MERREM) 1 g in sodium chloride 0.9 % 100 mL IVPB (0 g Intravenous Stopped 03/19/22 1859)  vancomycin (VANCOREADY) IVPB 1750 mg/350 mL (has no administration in time range)  lactated ringers bolus 1,000 mL (0 mLs Intravenous Stopped 03/19/22 1900)  lactated ringers bolus 2,000 mL (0 mLs Intravenous Stopped 03/19/22 2029)  vancomycin (VANCOREADY) IVPB 1750 mg/350 mL (0 mg Intravenous Stopped 03/19/22 2029)  sodium chloride 0.9 % bolus 1,000 mL (1,000 mLs Intravenous New Bag/Given 03/19/22 2029)  iohexol (OMNIPAQUE) 350 MG/ML injection 80 mL (80 mLs Intravenous Contrast Given 03/19/22 1959)    ED Course/ Medical Decision Making/ A&P    61 year old wheelchair-bound at baseline, history of diabetes, self-catheterization due to paraplegia here for evaluation under code sepsis.  Was hospitalized about 3 weeks ago for extended period of time due to ESBL bacteremia.  Completed 2 weeks of IV antibiotics.  Not currently on antibiotics.  Had his midline pulled.  Began to have symptoms again.   On arrival patient febrile, tachycardic, tachypneic.  Code sepsis called.  He was given meropenem due to his prior blood cultures, added on Vanco to cover for MRSA due to recent midline placed.  Soft blood pressures however maps greater than 65.  Reviewed blood cultures from prior admission, E. coli, ESBL.  On meropenem inpatient, ertapenem outpatient.  Saw ID, reviewed IDs note repeat BC without growth, completed 2 weeks IV antibiotics for low nephritis  Labs and imaging personally viewed and interpreted:  CBC leukocytosis 18.3, hemoglobin 9.5 CMP sodium 132, creatinine 1.52, alk phos 181 Lactic 1.9 BC pending DG chest no acute findings CT AP thickening of bladder wall, right acute femoral neck fracture UA large leuks, rare bacteria EKG  Patient reassessed.  Unfortunately nurse has not started IV fluids or antibiotics yet.  I discussed with nursing.  They were in with another sick patient.  Discussed with other nursing staff to assist getting patient's work-up started given code sepsis called on my initial assessment.  Reassessed, blood pressure improved, defervesced with antipyretic.  Discussed CT imaging with patient, family in room with translator.  They state patient has not fallen since prior hospitalization.  Unsure how he got this fracture.  Patient to be admitted for sepsis, given antibiotics to cover his prior bacteremia blood cultures.  CONSULT with Dr. Erlinda Hong with orthopedics.  States fracture is nonoperative  We will admit for sepsis.  No cough, pneumonia chest x-ray to suggest pneumonia, he has no cellulitis, ulcerations to suggest open wounds as cause of his fever.  Does self cath, suspect urine as cause of his sepsis.  No headache, neck pain to suggest meningitis  CONSULT with Dr. Hal Hope with [facility] agrees to evaluate patient for admission  The patient appears reasonably stabilized for admission considering the current resources, flow, and capabilities available in the ED at  this time, and I doubt any other [facility] requiring further screening and/or treatment in the ED prior to admission.  Medical Decision Making Amount and/or Complexity of Data Reviewed Independent Historian: spouse External Data Reviewed: labs, radiology, ECG and notes.    Details: Recent admission, infectious disease notes, blood cultures, urine culture Labs: ordered. Decision-making details documented in ED Course. Radiology: ordered and independent interpretation performed. Decision-making details documented in ED Course. ECG/medicine tests: ordered and independent interpretation performed. Decision-making details documented in ED Course.  Risk OTC drugs. Prescription drug management. Parenteral controlled substances. Decision regarding hospitalization. Diagnosis or treatment significantly limited by social determinants of health.         Final Clinical Impression(s) / ED Diagnoses Final diagnoses:  Sepsis without acute organ dysfunction, due to unspecified organism [facility])  Lower urinary tract infectious disease  Closed fracture of neck of right femur, initial encounter ([place])  Paraplegia [facility])    Rx / DC Orders ED Discharge Orders     None         Nathalia Wismer A, PA-C 03/19/22 2151    Lennice Sites, DO 03/19/22 2248

## 2022-03-19 NOTE — ED Notes (Signed)
Pt's bladder drained using 22 F foley. Output is clear pale yellow urine.

## 2022-03-19 NOTE — Progress Notes (Signed)
John Meza a 61 y.o. male admitted on 03/19/2022 with sepsis. Patient recently completed 14 days of carbapenem therapy for ESBL urosepsis/bacteremia on 03/16/22 via a midline.Of note, patient is paraplegic, wheelchair bound, has sacral decubitus ulcers, neurogenic bladder and self-caths. Pharmacy has been consulted for Vancomycin/meropenem dosing.  Scr 1.52 (BL~1.1-1.2)  Vital Signs: Tm 101.8, HR elevated, BP WNL  Plan: START Meropenem 1g IV Q8H GIVE Vancomycin 1,750 mg IV x1 (Wt used: 87 kg- via  flowsheets)  THEN Vancomycin 1,750 mg IV Q48H (Scr used: 1.52, Vd used: 0.5, eAUC: 488) Monitor renal function, clinical status, de-escalation, C/S, levels as indicated   Allergies:  Allergies  Allergen Reactions   Macrobid [Nitrofurantoin] Itching and Rash    There were no vitals filed for this visit.     Latest Ref Rng & Units 03/16/2022   10:24 AM 03/02/2022    3:59 PM 02/28/2022    2:09 PM  CBC  WBC 3.8 - 10.8 Thousand/uL 12.9  8.0  9.8   Hemoglobin 13.2 - 17.1 g/dL 8.9  9.6  42.5   Hematocrit 38.5 - 50.0 % 27.5  28.5  31.8   Platelets 140 - 400 Thousand/uL 567  175  149     Antimicrobials this admission: Vancomycin 03/19/2022>>  Meropenem 03/19/2022>>  Microbiology results: 03/19/2022 Bcx: sent 03/19/2022 Ucx: sent   Thank you for allowing pharmacy to be a part of this patient's care.  Jani Gravel, PharmD PGY-1 Acute Care Resident  03/19/2022 4:42 PM

## 2022-03-19 NOTE — ED Provider Triage Note (Signed)
Emergency Medicine Provider Triage Evaluation Note  John Meza , a 61 y.o. male  was evaluated in triage.  Pt complains of fever, feeling ill and vomiting.  Was discharged in the hospital a week ago after a blood infection.  Presents today because he is feeling worse, having fevers and unable to tolerate anything by mouth.  Review of Systems  Per HPI  Physical Exam  BP 94/63 (BP Location: Left Arm)   Pulse (!) 136   Temp (!) 101.8 F (38.8 C) (Oral)   Resp (!) 22   SpO2 97%  Gen:   Awake, no distress   Resp:  Normal effort  MSK:   Moves extremities without difficulty  Other:  Ill-appearing, tachycardic and tachypneic.  Medical Decision Making  Medically screening exam initiated at 4:04 PM.  Appropriate orders placed.  John Meza was informed that the remainder of the evaluation will be completed by another provider, this initial triage assessment does not replace that evaluation, and the importance of remaining in the ED until their evaluation is complete.  Code sepsis initiated   Theron Arista, [place] 03/19/22 1605

## 2022-03-19 NOTE — Telephone Encounter (Signed)
Acknowledged and thank you

## 2022-03-19 NOTE — Progress Notes (Signed)
Consult received from EDP.  Reviewed patient's history and CT scan showing incidental finding of right femoral neck fx.  Patient's baseline function is paraplegic that is a wheelchair ambulator.  The right hip is insensate.  No orthopedic surgical intervention indicated.  Full consult in the morning.  Azucena Cecil, MD [facility] 9:08 PM

## 2022-03-20 DIAGNOSIS — Z1612 Extended spectrum beta lactamase (ESBL) resistance: Secondary | ICD-10-CM

## 2022-03-20 DIAGNOSIS — A499 Bacterial infection, unspecified: Secondary | ICD-10-CM

## 2022-03-20 DIAGNOSIS — R7881 Bacteremia: Secondary | ICD-10-CM

## 2022-03-20 DIAGNOSIS — N390 Urinary tract infection, site not specified: Secondary | ICD-10-CM

## 2022-03-20 DIAGNOSIS — A419 Sepsis, unspecified organism: Secondary | ICD-10-CM

## 2022-03-20 DIAGNOSIS — G8220 Paraplegia, unspecified: Secondary | ICD-10-CM

## 2022-03-20 DIAGNOSIS — B9620 Unspecified Escherichia coli [E. coli] as the cause of diseases classified elsewhere: Secondary | ICD-10-CM

## 2022-03-20 DIAGNOSIS — N319 Neuromuscular dysfunction of bladder, unspecified: Secondary | ICD-10-CM

## 2022-03-20 LAB — BLOOD CULTURE ID PANEL (REFLEXED) - BCID2

## 2022-03-20 LAB — URINALYSIS
Bilirubin, UA: NEGATIVE
Glucose, UA: NEGATIVE
Ketones, UA: NEGATIVE
Nitrite, UA: NEGATIVE
RBC, UA: NEGATIVE
Specific Gravity, UA: 1.009 (ref 1.005–1.030)
Urobilinogen, Ur: 0.2 mg/dL (ref 0.2–1.0)
pH, UA: 6 (ref 5.0–7.5)

## 2022-03-20 LAB — MICROALBUMIN / CREATININE URINE RATIO
Creatinine, Urine: 46.6 mg/dL
Microalb/Creat Ratio: 571 mg/g creat — ABNORMAL HIGH (ref 0–29)
Microalbumin, Urine: 266.3 ug/mL

## 2022-03-20 LAB — COMPREHENSIVE METABOLIC PANEL
ALT: 25 U/L (ref 0–44)
AST: 23 U/L (ref 15–41)
Albumin: 1.8 g/dL — ABNORMAL LOW (ref 3.5–5.0)
Alkaline Phosphatase: 169 U/L — ABNORMAL HIGH (ref 38–126)
Anion gap: 7 (ref 5–15)
BUN: 17 mg/dL (ref 6–20)
CO2: 20 mmol/L — ABNORMAL LOW (ref 22–32)
Calcium: 7.7 mg/dL — ABNORMAL LOW (ref 8.9–10.3)
Chloride: 105 mmol/L (ref 98–111)
Creatinine, Ser: 1.27 mg/dL — ABNORMAL HIGH (ref 0.61–1.24)
GFR, Estimated: >60 mL/min (ref >60)
Glucose, Bld: 126 mg/dL — ABNORMAL HIGH (ref 70–99)
Potassium: 3.8 mmol/L (ref 3.5–5.1)
Sodium: 132 mmol/L — ABNORMAL LOW (ref 135–145)
Total Bilirubin: 0.4 mg/dL (ref 0.3–1.2)
Total Protein: 5.7 g/dL — ABNORMAL LOW (ref 6.5–8.1)

## 2022-03-20 LAB — CBC
HCT: 24 % — ABNORMAL LOW (ref 39.0–52.0)
Hemoglobin: 7.8 g/dL — ABNORMAL LOW (ref 13.0–17.0)
MCH: 28.2 pg (ref 26.0–34.0)
MCHC: 32.5 g/dL (ref 30.0–36.0)
MCV: 86.6 fL (ref 80.0–100.0)
Platelets: 361 10*3/uL (ref 150–400)
RBC: 2.77 MIL/uL — ABNORMAL LOW (ref 4.22–5.81)
RDW: 13.7 % (ref 11.5–15.5)
WBC: 20.2 10*3/uL — ABNORMAL HIGH (ref 4.0–10.5)
nRBC: 0 % (ref 0.0–0.2)

## 2022-03-20 LAB — CBG MONITORING, ED
Glucose-Capillary: 112 mg/dL — ABNORMAL HIGH (ref 70–99)
Glucose-Capillary: 148 mg/dL — ABNORMAL HIGH (ref 70–99)
Glucose-Capillary: 144 mg/dL — ABNORMAL HIGH (ref 70–99)

## 2022-03-20 LAB — GLUCOSE, CAPILLARY
Glucose-Capillary: 137 mg/dL — ABNORMAL HIGH (ref 70–99)
Glucose-Capillary: 145 mg/dL — ABNORMAL HIGH (ref 70–99)

## 2022-03-20 MED ORDER — LACTATED RINGERS IV SOLN: INTRAVENOUS | Status: DC

## 2022-03-20 MED ORDER — VANCOMYCIN HCL IN DEXTROSE 1-5 GM/200ML-% IV SOLN: 1000.0000 mg | INTRAVENOUS | Status: DC

## 2022-03-20 MED ORDER — FLUCONAZOLE IN SODIUM CHLORIDE 200-0.9 MG/100ML-% IV SOLN
200.0000 mg | INTRAVENOUS | Status: DC
  Administered 2022-03-20: 200 mg via INTRAVENOUS
  Filled 2022-03-20 (×2): qty 100

## 2022-03-20 NOTE — ED Notes (Signed)
Pt had large bowel movement. RN applied new diaper and chuck. Peri care.

## 2022-03-20 NOTE — Progress Notes (Signed)
PHARMACY - PHYSICIAN COMMUNICATION CRITICAL VALUE ALERT - BLOOD CULTURE IDENTIFICATION (BCID)  Field Franco Edmundson is an 61 y.o. male who presented to [facility] on 03/19/2022 with a chief complaint of fever, N/V  Assessment: 73 YOM with hx paraplegia and neurogenic bladder who self caths at home with recent ESBL E.coli bacteremia/pyelo in late May - who received 2 weeks of Ertapenem thru EOT 6/3.  The patient represented with fever, N/V concerning for urosepsis with BCx showing GNR in 2/4 bottles with BCID detecting recurrent ESBL E.coli  Name of physician (or Provider) Contacted: Samtani  Current antibiotics: Meropenem + Vancomycin + Fluconazole  Changes to prescribed antibiotics recommended:  D/c Vancomycin, asked to d/c Fluconazole but Dr. Verlon Au wants to continue for now Continue Meropenem 1g IV every 8 hours Would recommend ID involvement given recurrent ESBL bacteremia  Results for orders placed or performed during the hospital encounter of 02/28/22  Blood Culture ID Panel (Reflexed) (Collected: 02/28/2022  2:07 PM)  Result Value Ref Range   Enterococcus faecalis NOT DETECTED NOT DETECTED   Enterococcus Faecium NOT DETECTED NOT DETECTED   Listeria monocytogenes NOT DETECTED NOT DETECTED   Staphylococcus species NOT DETECTED NOT DETECTED   Staphylococcus aureus (BCID) NOT DETECTED NOT DETECTED   Staphylococcus epidermidis NOT DETECTED NOT DETECTED   Staphylococcus lugdunensis NOT DETECTED NOT DETECTED   Streptococcus species NOT DETECTED NOT DETECTED   Streptococcus agalactiae NOT DETECTED NOT DETECTED   Streptococcus pneumoniae NOT DETECTED NOT DETECTED   Streptococcus pyogenes NOT DETECTED NOT DETECTED   A.calcoaceticus-baumannii NOT DETECTED NOT DETECTED   Bacteroides fragilis NOT DETECTED NOT DETECTED   Enterobacterales DETECTED (A) NOT DETECTED   Enterobacter cloacae complex NOT DETECTED NOT DETECTED   Escherichia coli DETECTED (A) NOT DETECTED   Klebsiella  aerogenes NOT DETECTED NOT DETECTED   Klebsiella oxytoca NOT DETECTED NOT DETECTED   Klebsiella pneumoniae NOT DETECTED NOT DETECTED   Proteus species NOT DETECTED NOT DETECTED   Salmonella species NOT DETECTED NOT DETECTED   Serratia marcescens NOT DETECTED NOT DETECTED   Haemophilus influenzae NOT DETECTED NOT DETECTED   Neisseria meningitidis NOT DETECTED NOT DETECTED   Pseudomonas aeruginosa NOT DETECTED NOT DETECTED   Stenotrophomonas maltophilia NOT DETECTED NOT DETECTED   Candida albicans NOT DETECTED NOT DETECTED   Candida auris NOT DETECTED NOT DETECTED   Candida glabrata NOT DETECTED NOT DETECTED   Candida krusei NOT DETECTED NOT DETECTED   Candida parapsilosis NOT DETECTED NOT DETECTED   Candida tropicalis NOT DETECTED NOT DETECTED   Cryptococcus neoformans/gattii NOT DETECTED NOT DETECTED   CTX-M ESBL DETECTED (A) NOT DETECTED   Carbapenem resistance IMP NOT DETECTED NOT DETECTED   Carbapenem resistance KPC NOT DETECTED NOT DETECTED   Carbapenem resistance NDM NOT DETECTED NOT DETECTED   Carbapenem resist OXA 48 LIKE NOT DETECTED NOT DETECTED   Carbapenem resistance VIM NOT DETECTED NOT DETECTED    Thank you for allowing pharmacy to be a part of this patient's care.  Alycia Rossetti, PharmD, BCPS Infectious Diseases Clinical Pharmacist 03/20/2022 8:29 AM   **Pharmacist phone directory can now be found on [email or web address] ([email or web address]).  Listed under [place].

## 2022-03-20 NOTE — Progress Notes (Addendum)
John Meza a 61 y.o. male admitted on 03/20/2022 with sepsis. Patient recently completed 14 days of carbapenem therapy for ESBL urosepsis/bacteremia on 03/16/22 via a midline.Of note, patient is paraplegic, wheelchair bound, has sacral decubitus ulcers, neurogenic bladder and self-caths. Pharmacy has been consulted for Vancomycin/meropenem dosing.  Scr down to 1.27 (BL~1.1-1.2). Noted pt now with GNR in blood cultures -no BCID yet.  Plan: Continue Meropenem 1gm IV q8h Change Vancomycin to 1000 mg IV Q24H (Scr used: 1.27, Vd used: 0.5, eAUC: 487) Diflucan 200mg  IV q24h Monitor renal function, clinical status, de-escalation, C/S, levels as indicated   Allergies:  Allergies  Allergen Reactions   Macrobid [Nitrofurantoin] Itching and Rash    There were no vitals filed for this visit.     Latest Ref Rng & Units 03/20/2022    3:40 AM 03/19/2022    4:12 PM 03/16/2022   10:24 AM  CBC  WBC 4.0 - 10.5 K/uL 20.2  18.3  12.9   Hemoglobin 13.0 - 17.0 g/dL 7.8  9.5  8.9   Hematocrit 39.0 - 52.0 % 24.0  29.3  27.5   Platelets 150 - 400 K/uL 361  428  567     Antimicrobials this admission: Vancomycin 03/20/2022>>  Meropenem 03/20/2022>> Diflucan 03/20/2022 >>  Microbiology results: 03/20/2022 Bcx: GNR 03/20/2022 Ucx: sent   Thank you for allowing pharmacy to be a part of this patient's care.  Sherlon Handing, PharmD, BCPS Please see amion for complete clinical pharmacist phone list 03/20/2022 7:17 AM

## 2022-03-20 NOTE — Progress Notes (Signed)
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 [name] ?]  And he does self catheterizes-prior ESBL E. coli bacteremia's-last admit 5/20-5/25 ESBL E. coli treated with ertapenem to complete 14-day course ending 6/3 ID HLD IDDM last A1c 8.3 class I obesity BMI 32 Prior acute cholecystitis 2016 possible choledocholithiasis,  Presented to [facility] emergency room with vomiting + fever-feeling poorly went to PCP office with allegedly 6/7 felt fair-patient had apparently been reusing his catheters?  Found to be septic on admission with blood pressures in the 80s fever and leukocytosis concerning for sepsis CT abdomen pelvis showed features concerning for cystitis and probable acute right hip fracture given fluid resuscitation 46 French Foley placed  Sodium 132 bicarb 18 BUN/creatinine 21/1.5 (baseline 20/1.1) WBC 18 hemoglobin 9.5 platelet 428 UA 1+ leukocytes 2+ protein Blood culture X1 bottle gram-negative rods + yeast   Hemoglobin dropped to 7.8 WBC 20.2 BUN/creatinine 17/1.20  S Interviewed with interpreter  Tells me that he was reusing catheters and supplies-states that he has been having intermittent chills and fevers even when he was taking the antibiotics at home with intermittent vomiting-it is unclear why he did not present earlier He lives at home with his wife who manages his care  Overall he feels somewhat better now  O/e  BP 131/62   Pulse 96   Temp 98.1 F (36.7 C) (Oral)   Resp 17   Wt 87 kg   SpO2 100%   BMI 32.92 kg/m  Awake coherent pleasant Hispanic male EOMI NCAT no focal deficit Chest clear no rales rhonchi wheeze Abdomen soft rebound no guarding No lower extremity edema no rash no  decubiti Moderate dentition with replaced teeth  P Discussed with ID who will see in consult and appreciate input continue meropenem discontinue vancomycin given budding yeast on urine we will continue fluconazole for now Received bolus of fluids 3 L and now 125 cc/H CBGs are moderately controlled 100s to 140s His AKI is somewhat improved Expectant management may need further IV therapies

## 2022-03-20 NOTE — Consult Note (Signed)
Reason for Consult:Right hip fx Referring Physician: Pleas Koch Time called: 0730 Time at bedside: 0922   John Meza is an 61 y.o. male.  HPI: John Meza came to the ED with c/o fevers, chills, and N/V. He underwent CT abd/pelvic which showed an incidental finding of a right hip fx and orthopedic surgery was consulted. He's unsure how it could have happened. He denies recent falls. He is paraplegic and gets around via WC.  Past Medical History:  Diagnosis Date   Cellulitis and abscess of buttock 10/2016   Diabetes mellitus    Hyperkalemia 03/16/2022   Hypertension    Paraplegia (HCC) 2013   fell from ladder   Pressure ulcer, buttock, right, unstageable (HCC) 12/30/2020   Sacral decubitus ulcer, stage IV (HCC) 11/03/2021   SCI (spinal cord injury)    Sepsis due to Escherichia coli (E. coli) (HCC) 03/01/2022   Spine fracture 11/16/2011   T 11- T9-L1    Past Surgical History:  Procedure Laterality Date   CHOLECYSTECTOMY N/A 05/01/2015   Procedure: LAPAROSCOPIC CHOLECYSTECTOMY WITH ATTEMPTED INTRAOPERATIVE CHOLANGIOGRAM;  Surgeon: Violeta Gelinas, MD;  Location: [facility] OR;  Service: General;  Laterality: N/A;   SPINE SURGERY      Family History  Problem Relation Age of Onset   Healthy Mother    Diabetes Father    Diabetes Sister     Social History:  reports that he quit smoking about 10 years ago. His smoking use included cigarettes. He has a 5.00 pack-year smoking history. He has never used smokeless tobacco. He reports that he does not currently use alcohol after a past usage of about 1.0 standard drink of alcohol per week. He reports that he does not use drugs.  Allergies:  Allergies  Allergen Reactions   Macrobid [Nitrofurantoin] Itching and Rash    Medications: I have reviewed the patient's current medications.  Results for orders placed or performed during the hospital encounter of 03/19/22 (from the past 48 hour(s))  Lactic acid, plasma     Status: None    Collection Time: 03/19/22  4:12 PM  Result Value Ref Range   Lactic Acid, Venous 1.9 0.5 - 1.9 mmol/L    Comment: Performed at [facility], [address] [place], [place] [address]  Comprehensive metabolic panel     Status: Abnormal   Collection Time: 03/19/22  4:12 PM  Result Value Ref Range   Sodium 132 (L) 135 - 145 mmol/L   Potassium 4.3 3.5 - 5.1 mmol/L   Chloride 100 98 - 111 mmol/L   CO2 18 (L) 22 - 32 mmol/L   Glucose, Bld 168 (H) 70 - 99 mg/dL    Comment: Glucose reference range applies only to samples taken after fasting for at least 8 hours.   BUN 21 (H) 6 - 20 mg/dL   Creatinine, Ser 6.04 (H) 0.61 - 1.24 mg/dL   Calcium 8.4 (L) 8.9 - 10.3 mg/dL   Total Protein 7.2 6.5 - 8.1 g/dL   Albumin 2.2 (L) 3.5 - 5.0 g/dL   AST 24 15 - 41 U/L   ALT 30 0 - 44 U/L   Alkaline Phosphatase 181 (H) 38 - 126 U/L   Total Bilirubin 1.0 0.3 - 1.2 mg/dL   GFR, Estimated 53 (L) >60 mL/min    Comment: (NOTE) Calculated using the CKD-EPI Creatinine Equation (2021)    Anion gap 14 5 - 15    Comment: Performed at [facility], [address], [place],  [place] [address]  CBC with Differential     Status: Abnormal   Collection Time: 03/19/22  4:12 PM  Result Value Ref Range   WBC 18.3 (H) 4.0 - 10.5 K/uL   RBC 3.44 (L) 4.22 - 5.81 MIL/uL   Hemoglobin 9.5 (L) 13.0 - 17.0 g/dL   HCT 93.8 (L) 18.2 - 99.3 %   MCV 85.2 80.0 - 100.0 fL   MCH 27.6 26.0 - 34.0 pg   MCHC 32.4 30.0 - 36.0 g/dL   RDW 71.6 96.7 - 89.3 %   Platelets 428 (H) 150 - 400 K/uL   nRBC 0.0 0.0 - 0.2 %   Neutrophils Relative % 94 %   Neutro Abs 17.3 (H) 1.7 - 7.7 K/uL   Lymphocytes Relative 2 %   Lymphs Abs 0.4 (L) 0.7 - 4.0 K/uL   Monocytes Relative 3 %   Monocytes Absolute 0.5 0.1 - 1.0 K/uL   Eosinophils Relative 0 %   Eosinophils Absolute 0.0 0.0 - 0.5 K/uL   Basophils Relative 0 %   Basophils Absolute 0.0 0.0 - 0.1 K/uL   Immature Granulocytes 1 %   Abs Immature Granulocytes 0.14 (H) 0.00 - 0.07  K/uL    Comment: Performed at [facility], [address], [place], [place] [address]  Protime-INR     Status: None   Collection Time: 03/19/22  4:12 PM  Result Value Ref Range   Prothrombin Time 14.5 11.4 - 15.2 seconds   INR 1.1 0.8 - 1.2    Comment: (NOTE) INR goal varies based on device and disease states. Performed at [facility], [address], [place], [place] [address]   APTT     Status: Abnormal   Collection Time: 03/19/22  4:12 PM  Result Value Ref Range   aPTT 40 (H) 24 - 36 seconds    Comment:        IF BASELINE aPTT IS ELEVATED, SUGGEST PATIENT RISK ASSESSMENT BE USED TO DETERMINE APPROPRIATE ANTICOAGULANT THERAPY. Performed at [facility], [address], [place], [place] [address]   Blood Culture (routine x 2)     Status: None (Preliminary result)   Collection Time: 03/19/22  4:13 PM   Specimen: BLOOD LEFT FOREARM  Result Value Ref Range   Specimen Description BLOOD LEFT FOREARM    Special Requests      BOTTLES DRAWN AEROBIC AND ANAEROBIC Blood Culture adequate volume   Culture  Setup Time      GRAM NEGATIVE RODS ANAEROBIC BOTTLE ONLY CRITICAL RESULT CALLED TO, READ BACK BY AND VERIFIED WITH: Lennie Hummer [id number] @0813  FH Performed at [facility], [address] [place], [place]    Culture PENDING    Report Status PENDING   Blood Culture ID Panel (Reflexed)     Status: Abnormal   Collection Time: 03/19/22  4:13 PM  Result Value Ref Range   Enterococcus faecalis NOT DETECTED NOT DETECTED   Enterococcus Faecium NOT DETECTED NOT DETECTED   Listeria monocytogenes NOT DETECTED NOT DETECTED   Staphylococcus species NOT DETECTED NOT DETECTED   Staphylococcus aureus (BCID) NOT DETECTED NOT DETECTED   Staphylococcus epidermidis NOT DETECTED NOT DETECTED   Staphylococcus lugdunensis NOT DETECTED NOT DETECTED   Streptococcus species NOT DETECTED NOT DETECTED   Streptococcus agalactiae NOT DETECTED NOT DETECTED    Streptococcus pneumoniae NOT DETECTED NOT DETECTED   Streptococcus pyogenes NOT DETECTED NOT DETECTED   A.calcoaceticus-baumannii NOT DETECTED NOT DETECTED  Bacteroides fragilis NOT DETECTED NOT DETECTED   Enterobacterales DETECTED (A) NOT DETECTED    Comment: Enterobacterales represent a large order of gram negative bacteria, not a single organism. CRITICAL RESULT CALLED TO, READ BACK BY AND VERIFIED WITH: PHARMD E. Daphine DeutscherMARTIN [phone] @0815  FH    Enterobacter cloacae complex NOT DETECTED NOT DETECTED   Escherichia coli DETECTED (A) NOT DETECTED    Comment: CRITICAL RESULT CALLED TO, READ BACK BY AND VERIFIED WITH: PHARMD Quintella Baton. MARTIN [phone] @0815  FH    Klebsiella aerogenes NOT DETECTED NOT DETECTED   Klebsiella oxytoca NOT DETECTED NOT DETECTED   Klebsiella pneumoniae NOT DETECTED NOT DETECTED   Proteus species NOT DETECTED NOT DETECTED   Salmonella species NOT DETECTED NOT DETECTED   Serratia marcescens NOT DETECTED NOT DETECTED   Haemophilus influenzae NOT DETECTED NOT DETECTED   Neisseria meningitidis NOT DETECTED NOT DETECTED   Pseudomonas aeruginosa NOT DETECTED NOT DETECTED   Stenotrophomonas maltophilia NOT DETECTED NOT DETECTED   Candida albicans NOT DETECTED NOT DETECTED   Candida auris NOT DETECTED NOT DETECTED   Candida glabrata NOT DETECTED NOT DETECTED   Candida krusei NOT DETECTED NOT DETECTED   Candida parapsilosis NOT DETECTED NOT DETECTED   Candida tropicalis NOT DETECTED NOT DETECTED   Cryptococcus neoformans/gattii NOT DETECTED NOT DETECTED   CTX-M ESBL DETECTED (A) NOT DETECTED    Comment: CRITICAL RESULT CALLED TO, READ BACK BY AND VERIFIED WITH: PHARMD EDaphine Deutscher. MARTIN [phone] @0815  FH (NOTE) Extended spectrum beta-lactamase detected. Recommend a carbapenem as initial therapy.      Carbapenem resistance IMP NOT DETECTED NOT DETECTED   Carbapenem resistance KPC NOT DETECTED NOT DETECTED   Carbapenem resistance NDM NOT DETECTED NOT DETECTED   Carbapenem resist OXA 48  LIKE NOT DETECTED NOT DETECTED   Carbapenem resistance VIM NOT DETECTED NOT DETECTED    Comment: Performed at [facility], [address] [place], [place] [address]  SARS Coronavirus 2 by RT PCR (hospital order, performed in [facility] hospital lab) *cepheid single result test* Anterior Nasal Swab     Status: None   Collection Time: 03/19/22  4:28 PM   Specimen: Anterior Nasal Swab  Result Value Ref Range   SARS Coronavirus 2 by RT PCR NEGATIVE NEGATIVE    Comment: (NOTE) SARS-CoV-2 target nucleic acids are NOT DETECTED.  The SARS-CoV-2 RNA is generally detectable in upper and lower respiratory specimens during the acute phase of infection. The lowest concentration of SARS-CoV-2 viral copies this assay can detect is 250 copies / mL. A negative result does not preclude SARS-CoV-2 infection and should not be used as the sole basis for treatment or other patient management decisions.  A negative result may occur with improper specimen collection / handling, submission of specimen other than nasopharyngeal swab, presence of viral mutation(s) within the areas targeted by this assay, and inadequate number of viral copies (<250 copies / mL). A negative result must be combined with clinical observations, patient history, and epidemiological information.  Fact Sheet for Patients:   RoadLapTop.co.zahttps://www.fda.gov/media/158405/download  Fact Sheet for Healthcare Providers: http://kim-miller.com/https://www.fda.gov/media/158404/download  This test is not yet approved or  cleared by the [place] FDA and has been authorized for detection and/or diagnosis of SARS-CoV-2 by FDA under an Emergency Use Authorization (EUA).  This EUA will remain in effect (meaning this test can be used) for the duration of the COVID-19 declaration under Section 564(b)(1) of the Act, 21 U.S.C. section 360bbb-3(b)(1), unless the authorization is terminated or revoked sooner.  Performed at [facility], [address]  [place], [place] [address]   Blood Culture (routine x 2)     Status: None (Preliminary result)   Collection Time: 03/19/22  5:59 PM   Specimen: BLOOD  Result Value Ref Range   Specimen Description BLOOD SITE NOT SPECIFIED    Special Requests      BOTTLES DRAWN AEROBIC AND ANAEROBIC Blood Culture adequate volume   Culture  Setup Time      GRAM NEGATIVE RODS IN BOTH AEROBIC AND ANAEROBIC BOTTLES CRITICAL VALUE NOTED.  VALUE IS CONSISTENT WITH PREVIOUSLY REPORTED AND CALLED VALUE. CRITICAL RESULT CALLED TO, READ BACK BY AND VERIFIED WITH: Lennie Hummer [id number] @0815  FH Performed at [facility], [address], [place], [place]    Culture PENDING    Report Status PENDING   Urinalysis, Routine w reflex microscopic Urine, Clean Catch     Status: Abnormal   Collection Time: 03/19/22  7:17 PM  Result Value Ref Range   Color, Urine YELLOW YELLOW   APPearance CLOUDY (A) CLEAR   Specific Gravity, Urine 1.013 1.005 - 1.030   pH 5.0 5.0 - 8.0   Glucose, UA 50 (A) NEGATIVE mg/dL   Hgb urine dipstick SMALL (A) NEGATIVE   Bilirubin Urine NEGATIVE NEGATIVE   Ketones, ur NEGATIVE NEGATIVE mg/dL   Protein, ur 05/19/22 (A) NEGATIVE mg/dL   Nitrite NEGATIVE NEGATIVE   Leukocytes,Ua LARGE (A) NEGATIVE   RBC / HPF >50 (H) 0 - 5 RBC/hpf   WBC, UA 11-20 0 - 5 WBC/hpf   Bacteria, UA RARE (A) NONE SEEN   Squamous Epithelial / LPF 0-5 0 - 5   WBC Clumps PRESENT    Mucus PRESENT    Budding Yeast PRESENT    Cellular Cast, UA 4     Comment: Performed at [facility], [address], [place], [place]  Comprehensive metabolic panel     Status: Abnormal   Collection Time: 03/20/22  3:40 AM  Result Value Ref Range   Sodium 132 (L) 135 - 145 mmol/L   Potassium 3.8 3.5 - 5.1 mmol/L   Chloride 105 98 - 111 mmol/L   CO2 20 (L) 22 - 32 mmol/L   Glucose, Bld 126 (H) 70 - 99 mg/dL    Comment: Glucose reference range applies only to samples taken after fasting for at least 8 hours.    BUN 17 6 - 20 mg/dL   Creatinine, Ser 05/20/22 (H) 0.61 - 1.24 mg/dL   Calcium 7.7 (L) 8.9 - 10.3 mg/dL   Total Protein 5.7 (L) 6.5 - 8.1 g/dL   Albumin 1.8 (L) 3.5 - 5.0 g/dL   AST 23 15 - 41 U/L   ALT 25 0 - 44 U/L   Alkaline Phosphatase 169 (H) 38 - 126 U/L   Total Bilirubin 0.4 0.3 - 1.2 mg/dL   GFR, Estimated 8.33 >82 mL/min    Comment: (NOTE) Calculated using the CKD-EPI Creatinine Equation (2021)    Anion gap 7 5 - 15    Comment: Performed at [facility], [address] [place], [place]  CBC     Status: Abnormal   Collection Time: 03/20/22  3:40 AM  Result Value Ref Range   WBC 20.2 (H) 4.0 - 10.5 K/uL   RBC 2.77 (L) 4.22 - 5.81 MIL/uL   Hemoglobin 7.8 (L) 13.0 - 17.0 g/dL   HCT 05/20/22 (L) 73.4 - 19.3 %   MCV  86.6 80.0 - 100.0 fL   MCH 28.2 26.0 - 34.0 pg   MCHC 32.5 30.0 - 36.0 g/dL   RDW 16.1 09.6 - 04.5 %   Platelets 361 150 - 400 K/uL   nRBC 0.0 0.0 - 0.2 %    Comment: Performed at [facility], [address], [place], [place] [address]  CBG monitoring, ED     Status: Abnormal   Collection Time: 03/20/22  7:46 AM  Result Value Ref Range   Glucose-Capillary 112 (H) 70 - 99 mg/dL    Comment: Glucose reference range applies only to samples taken after fasting for at least 8 hours.    CT ABDOMEN PELVIS W CONTRAST  Result Date: 03/19/2022 CLINICAL DATA:  Sepsis. EXAM: CT ABDOMEN AND PELVIS WITH CONTRAST TECHNIQUE: Multidetector CT imaging of the abdomen and pelvis was performed using the standard protocol following bolus administration of intravenous contrast. RADIATION DOSE REDUCTION: This exam was performed according to the departmental dose-optimization program which includes automated exposure control, adjustment of the mA and/or kV according to patient size and/or use of iterative reconstruction technique. CONTRAST:  80mL OMNIPAQUE IOHEXOL 350 MG/ML SOLN COMPARISON:  CT abdomen and pelvis 03/03/2022. FINDINGS: Lower chest: No acute abnormality.  Hepatobiliary: Gallbladder surgically absent. 15 mm rounded hypodensity near the gallbladder fossa is unchanged favored as a small cyst. No new liver lesions are identified. No biliary ductal dilatation. Pancreas: Unremarkable. No pancreatic ductal dilatation or surrounding inflammatory changes. Spleen: Normal in size without focal abnormality. Adrenals/Urinary Tract: There is marked diffuse wall thickening of the bladder. 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 There is no hydronephrosis or perinephric fluid. No significant excretion of contrast on delayed imaging. Adrenal glands within normal limits. Stomach/Bowel: Small hiatal hernia is present. Stomach is otherwise within normal limits. Appendix appears normal. No evidence of bowel wall thickening, distention, or inflammatory changes. Vascular/Lymphatic: No significant vascular findings are present. No enlarged abdominal or pelvic lymph nodes. Reproductive: Prostate is enlarged. Other: Small fat containing left inguinal hernia. Mild presacral edema. No ascites. Musculoskeletal: There is an acute right femoral neck fracture with superolateral displacement of the distal fracture fragment. There is apex anterior angulation. There is joint effusion and small amount of air in the joint space compatible with fracture. There is surrounding soft tissue edema. No dislocation. T9-L1 posterior fusion hardware appears intact. There are healed left-sided rib fractures. IMPRESSION: 1. Acute displaced right femoral neck fracture. 2. Marked diffuse wall thickening of the bladder again seen. This may represent cystitis. Bladder diverticulum again noted. Please correlate clinically. 3. Delayed excretion of contrast from the kidneys. Correlate for medical renal disease. No hydronephrosis. Electronically Signed   By: Darliss Cheney M.D.   On: 03/19/2022 20:26   DG Chest Port 1 View  Result Date: 03/19/2022 CLINICAL DATA:  Questionable sepsis - evaluate for abnormality  EXAM: PORTABLE CHEST 1 VIEW COMPARISON:  Feb 28, 2022 FINDINGS: The heart size and mediastinal contours are within normal limits. No focal consolidation, pleural effusion or vascular congestion. There is minor atelectasis seen at the right lung base and is new. There are thoracolumbar fusion changes seen, stable. IMPRESSION: Minor atelectasis at the right lung base. Otherwise the study is unremarkable. Electronically Signed   By: Marjo Bicker M.D.   On: 03/19/2022 18:04    Review of Systems  Constitutional:  Positive for chills and fatigue.  HENT:  Negative for ear discharge, ear pain, hearing loss and tinnitus.   Eyes:  Negative for photophobia and  pain.  Respiratory:  Negative for cough and shortness of breath.   Cardiovascular:  Negative for chest pain.  Gastrointestinal:  Positive for nausea and vomiting. Negative for abdominal pain.  Genitourinary:  Negative for dysuria, flank pain, frequency and urgency.  Musculoskeletal:  Negative for arthralgias, back pain, myalgias and neck pain.  Neurological:  Negative for dizziness and headaches.  Hematological:  Does not bruise/bleed easily.  Psychiatric/Behavioral:  The patient is not nervous/anxious.    Blood pressure 128/66, pulse (!) 103, temperature 98.1 F (36.7 C), temperature source Oral, resp. rate 15, weight 87 kg, SpO2 98 %. Physical Exam Constitutional:      General: He is not in acute distress.    Appearance: He is well-developed. He is not diaphoretic.  HENT:     Head: Normocephalic and atraumatic.  Eyes:     General: No scleral icterus.       Right eye: No discharge.        Left eye: No discharge.     Conjunctiva/sclera: Conjunctivae normal.  Cardiovascular:     Rate and Rhythm: Normal rate and regular rhythm.  Pulmonary:     Effort: Pulmonary effort is normal. No respiratory distress.  Musculoskeletal:     Cervical back: Normal range of motion.     Comments: RLE No traumatic wounds, ecchymosis, or rash  Insensate  No  knee or ankle effusion  Knee stable to varus/ valgus and anterior/posterior stress  Sens DPN, SPN, TN absent  Motor EHL, ext, flex, evers absent  DP 2+, PT 2+, No significant edema  Skin:    General: Skin is warm and dry.  Neurological:     Mental Status: He is alert.  Psychiatric:        Mood and Affect: Mood normal.        Behavior: Behavior normal.     Assessment/Plan: Right hip fx -- Given paraplegia no treatment necessary for fx. He may continue to use his WC as before.    Freeman Caldron, PA-C Orthopedic Surgery [phone] 03/20/2022, 9:32 AM   Attestation:  I have seen patient and examined him.  Patient is a paraplegic at baseline.  He reports no pain to the right hip.  Wheelchair ambulator.  No orthopedic surgical intervention necessary at this time. Patient can follow up as outpatient only as needed.

## 2022-03-20 NOTE — Consult Note (Signed)
[facility]    Date of Admission:  03/19/2022     Total days of antibiotics                Reason for Consult: ESBL E. Coli Bacteremia  Referring Provider: Dr. Hal Hope Primary Care Provider: [name], MD   ASSESSMENT:  John Meza is a 61 y/o male with paraplegia s/p fall 10 years ago with neurogenic bladder who self caths 3x daily with same catheter secondary to lack of insurance who completed 14 days of ertapenem on 03/14/22 for ESBL E. Coli UTI and bacteremia and now returns with fever and vomiting. CT abdomen/pelvis with cystitis and subsequent hip fracture finding. Blood cultures once again positive for ESBL E. Coli.  Narrowing antibiotics to meropenem. Source of infection is unclear with concern for reused catheter versus alternative source given multiple infections. Continue contact precautions. Remaining medical and supportive care per primary team.    PLAN:  Continue meropemem.  Repeat blood cultures for clearance of bacteremia.  Contact precautions for ESBL E. Coli.  Remaining medical and supportive care per primary team.    Active Problems:   HLD (hyperlipidemia)   Paraplegia (HCC)   Neurogenic bladder   Insulin dependent type 2 diabetes mellitus, controlled (HCC)   Essential hypertension   Lower urinary tract infectious disease   Sepsis (HCC)    atorvastatin  20 mg Oral Q1200   enoxaparin (LOVENOX) injection  40 mg Subcutaneous Q24H   ferrous sulfate  325 mg Oral Daily   insulin aspart  0-9 Units Subcutaneous TID WC   insulin glargine-yfgn  15 Units Subcutaneous QHS     HPI: John Meza is a 61 y.o. male with previous medical history of type 2 diabetes, hypertension, chronic anemia, paraplegia status post fall greater than 10 years ago with neurogenic bladder using self-catheterization and recent admission for ESBL E. coli bacteremia presents with vomiting and recurrent fever. A medical interpreter  was present to aid in communication as John Meza primary preferred language is Spanish  John Meza was recently hospitalized in May with fever/chills and concern for bladder infection.  Self cath 3 times daily with 1 catheter and history of ESBL colonization.  He was treated with 1 dose of Zosyn and then switched to meropenem with course complicated by ESBL E. coli bacteremia.  Source of infection believed to be bilateral pyelonephritis which was demonstrated on CT scan.  Discharged with 2 weeks of IV ertapenem.  Seen for follow-up on 03/16/2022 by Dr. [name] with good adherence and tolerance to antibiotic therapy.  Noted to have subjective fevers and shaking after home health nurse changed his midline dressing.  Midline was removed and blood cultures obtained along with CBC and inflammatory markers.  Upper extremity Doppler negative for DVT.  Blood cultures on 03/16/2022 without growth to date.   John Meza started feeling poorly a few days after his midline was removed with vomiting and shaking chills. Code sepsis called in ED and started on meropenem and vancomycin  following blood cultures. Chest x-ray with mild atelectasis. CT abdomen/pelvis with acute displaced femoral neck fracture; and marked diffuse wall thickening of the bladder representing cystitis; and concern for medical renal disease with delayed excretion of contrast from kidneys and no hydronephrosis. Has buttocks/sacral pressure ulcer. Blood cultures with gram stain showing gram negative rods and BCID with ESBL E. Coli. Seen by Orthopedics for hip fracture with no treatment needed.  John Meza is feeling better  since arrival at the hospital. A reusable catheter is used at home that is cleansed with antimicrobial soap for catheterizations up to 4 times per day. Recently saw Urology and was given a new catheter.  Review of Systems: Review of Systems  Constitutional:  Negative for chills, fever and weight  loss.  Respiratory:  Negative for cough, shortness of breath and wheezing.   Cardiovascular:  Negative for chest pain and leg swelling.  Gastrointestinal:  Negative for abdominal pain, constipation, diarrhea, nausea and vomiting.  Skin:  Negative for rash.     Past Medical History:  Diagnosis Date   Cellulitis and abscess of buttock 10/2016   Diabetes mellitus    Hyperkalemia 03/16/2022   Hypertension    Paraplegia (HCC) 2013   fell from ladder   Pressure ulcer, buttock, right, unstageable (HCC) 12/30/2020   Sacral decubitus ulcer, stage IV (HCC) 11/03/2021   SCI (spinal cord injury)    Sepsis due to Escherichia coli (E. coli) (HCC) 03/01/2022   Spine fracture 11/16/2011   T 11- T9-L1    Social History   Tobacco Use   Smoking status: Former    Packs/day: 1.00    Years: 5.00    Total pack years: 5.00    Types: Cigarettes    Quit date: 10/13/2011    Years since quitting: 10.4   Smokeless tobacco: Never   Tobacco comments:    03-24-19 per pt he stopped 1 mo ago   Vaping Use   Vaping Use: Never used  Substance Use Topics   Alcohol use: Not Currently    Alcohol/week: 1.0 standard drink of alcohol    Types: 1 Cans of beer per week   Drug use: Never    Family History  Problem Relation Age of Onset   Healthy Mother    Diabetes Father    Diabetes Sister     Allergies  Allergen Reactions   Macrobid [Nitrofurantoin] Itching and Rash    OBJECTIVE: Blood pressure (!) 157/73, pulse (!) 111, temperature 98.1 F (36.7 C), temperature source Oral, resp. rate 18, weight 87 kg, SpO2 99 %.  Physical Exam Constitutional:      General: He is not in acute distress.    Appearance: He is well-developed.  Cardiovascular:     Rate and Rhythm: Normal rate and regular rhythm.     Heart sounds: Normal heart sounds.  Pulmonary:     Effort: Pulmonary effort is normal.     Breath sounds: Normal breath sounds.  Skin:    General: Skin is warm and dry.  Neurological:     Mental Status:  He is alert and oriented to person, place, and time.  Psychiatric:        Behavior: Behavior normal.        Thought Content: Thought content normal.        Judgment: Judgment normal.     Lab Results Lab Results  Component Value Date   WBC 20.2 (H) 03/20/2022   HGB 7.8 (L) 03/20/2022   HCT 24.0 (L) 03/20/2022   MCV 86.6 03/20/2022   PLT 361 03/20/2022    Lab Results  Component Value Date   CREATININE 1.27 (H) 03/20/2022   BUN 17 03/20/2022   NA 132 (L) 03/20/2022   K 3.8 03/20/2022   CL 105 03/20/2022   CO2 20 (L) 03/20/2022    Lab Results  Component Value Date   ALT 25 03/20/2022   AST 23 03/20/2022   ALKPHOS 169 (H) 03/20/2022  BILITOT 0.4 03/20/2022     Microbiology: Recent Results (from the past 240 hour(s))  Blood culture (routine single)     Status: None (Preliminary result)   Collection Time: 03/16/22 10:24 AM   Specimen: Blood  Result Value Ref Range Status   MICRO NUMBER: [id number]  Preliminary   SPECIMEN QUALITY: Suboptimal  Preliminary   Source BLOOD 1  Preliminary   STATUS: PRELIMINARY  Preliminary   Result:   Preliminary    No growth to date. Culture is continuously monitored for a total of 120 hours incubation. A change in status will result in a phone report followed by an updated printed culture report. Inspection of blood culture bottles indicates that an inadequate  volume of blood may have been collected for the detection of sepsis.    COMMENT: Aerobic and anaerobic bottle received.  Preliminary  Blood culture (routine single)     Status: None (Preliminary result)   Collection Time: 03/16/22 10:24 AM   Specimen: Blood  Result Value Ref Range Status   MICRO NUMBER: [id number]  Preliminary   SPECIMEN QUALITY: Adequate  Preliminary   Source BLOOD 2  Preliminary   STATUS: PRELIMINARY  Preliminary   Result:   Preliminary    No growth to date. Culture is continuously monitored for a total of 120 hours incubation. A change in status will result in a  phone report followed by an updated printed culture report.   COMMENT: Aerobic and anaerobic bottle received.  Preliminary  Blood Culture (routine x 2)     Status: None (Preliminary result)   Collection Time: 03/19/22  4:13 PM   Specimen: BLOOD LEFT FOREARM  Result Value Ref Range Status   Specimen Description BLOOD LEFT FOREARM  Final   Special Requests   Final    BOTTLES DRAWN AEROBIC AND ANAEROBIC Blood Culture adequate volume   Culture  Setup Time   Final    GRAM NEGATIVE RODS ANAEROBIC BOTTLE ONLY CRITICAL RESULT CALLED TO, READ BACK BY AND VERIFIED WITH: PHARMD EHassell Done [id number] @0813  FH    Culture   Final    NO GROWTH < 24 HOURS Performed at [facility], [place] [address], [place], [place] [address]    Report Status PENDING  Incomplete  Blood Culture ID Panel (Reflexed)     Status: Abnormal   Collection Time: 03/19/22  4:13 PM  Result Value Ref Range Status   Enterococcus faecalis NOT DETECTED NOT DETECTED Final   Enterococcus Faecium NOT DETECTED NOT DETECTED Final   Listeria monocytogenes NOT DETECTED NOT DETECTED Final   Staphylococcus species NOT DETECTED NOT DETECTED Final   Staphylococcus aureus (BCID) NOT DETECTED NOT DETECTED Final   Staphylococcus epidermidis NOT DETECTED NOT DETECTED Final   Staphylococcus lugdunensis NOT DETECTED NOT DETECTED Final   Streptococcus species NOT DETECTED NOT DETECTED Final   Streptococcus agalactiae NOT DETECTED NOT DETECTED Final   Streptococcus pneumoniae NOT DETECTED NOT DETECTED Final   Streptococcus pyogenes NOT DETECTED NOT DETECTED Final   A.calcoaceticus-baumannii NOT DETECTED NOT DETECTED Final   Bacteroides fragilis NOT DETECTED NOT DETECTED Final   Enterobacterales DETECTED (A) NOT DETECTED Final    Comment: Enterobacterales represent a large order of gram negative bacteria, not a single organism. CRITICAL RESULT CALLED TO, READ BACK BY AND VERIFIED WITH: PHARMD E. Hassell Done [id number] @0815  FH    Enterobacter cloacae  complex NOT DETECTED NOT DETECTED Final   Escherichia coli DETECTED (A) NOT DETECTED Final    Comment: CRITICAL RESULT CALLED TO,  READ BACK BY AND VERIFIED WITH: Cathlyn Parsons [id number] @0815  FH    Klebsiella aerogenes NOT DETECTED NOT DETECTED Final   Klebsiella oxytoca NOT DETECTED NOT DETECTED Final   Klebsiella pneumoniae NOT DETECTED NOT DETECTED Final   Proteus species NOT DETECTED NOT DETECTED Final   Salmonella species NOT DETECTED NOT DETECTED Final   Serratia marcescens NOT DETECTED NOT DETECTED Final   Haemophilus influenzae NOT DETECTED NOT DETECTED Final   Neisseria meningitidis NOT DETECTED NOT DETECTED Final   Pseudomonas aeruginosa NOT DETECTED NOT DETECTED Final   Stenotrophomonas maltophilia NOT DETECTED NOT DETECTED Final   Candida albicans NOT DETECTED NOT DETECTED Final   Candida auris NOT DETECTED NOT DETECTED Final   Candida glabrata NOT DETECTED NOT DETECTED Final   Candida krusei NOT DETECTED NOT DETECTED Final   Candida parapsilosis NOT DETECTED NOT DETECTED Final   Candida tropicalis NOT DETECTED NOT DETECTED Final   Cryptococcus neoformans/gattii NOT DETECTED NOT DETECTED Final   CTX-M ESBL DETECTED (A) NOT DETECTED Final    Comment: CRITICAL RESULT CALLED TO, READ BACK BY AND VERIFIED WITH: PHARMD EHassell Done [id number] @0815  FH (NOTE) Extended spectrum beta-lactamase detected. Recommend a carbapenem as initial therapy.      Carbapenem resistance IMP NOT DETECTED NOT DETECTED Final   Carbapenem resistance KPC NOT DETECTED NOT DETECTED Final   Carbapenem resistance NDM NOT DETECTED NOT DETECTED Final   Carbapenem resist OXA 48 LIKE NOT DETECTED NOT DETECTED Final   Carbapenem resistance VIM NOT DETECTED NOT DETECTED Final    Comment: Performed at [facility], [place] [address] [place], [place] [address]  SARS Coronavirus 2 by RT PCR (hospital order, performed in [facility] hospital lab) *cepheid single result test* Anterior Nasal Swab     Status: None    Collection Time: 03/19/22  4:28 PM   Specimen: Anterior Nasal Swab  Result Value Ref Range Status   SARS Coronavirus 2 by RT PCR NEGATIVE NEGATIVE Final    Comment: (NOTE) SARS-CoV-2 target nucleic acids are NOT DETECTED.  The SARS-CoV-2 RNA is generally detectable in upper and lower respiratory specimens during the acute phase of infection. The lowest concentration of SARS-CoV-2 viral copies this assay can detect is 250 copies / mL. A negative result does not preclude SARS-CoV-2 infection and should not be used as the sole basis for treatment or other patient management decisions.  A negative result may occur with improper specimen collection / handling, submission of specimen other than nasopharyngeal swab, presence of viral mutation(s) within the areas targeted by this assay, and inadequate number of viral copies (<250 copies / mL). A negative result must be combined with clinical observations, patient history, and epidemiological information.  Fact Sheet for Patients:   https://www.patel.info/  Fact Sheet for Healthcare Providers: https://hall.com/  This test is not yet approved or  cleared by the [place] FDA and has been authorized for detection and/or diagnosis of SARS-CoV-2 by FDA under an Emergency Use Authorization (EUA).  This EUA will remain in effect (meaning this test can be used) for the duration of the COVID-19 declaration under Section 564(b)(1) of the Act, 21 U.S.C. section 360bbb-3(b)(1), unless the authorization is terminated or revoked sooner.  Performed at [facility], [place] [address], [place], [place] [address]   Blood Culture (routine x 2)     Status: None (Preliminary result)   Collection Time: 03/19/22  5:59 PM   Specimen: BLOOD  Result Value Ref Range Status   Specimen Description  BLOOD SITE NOT SPECIFIED  Final   Special Requests   Final    BOTTLES DRAWN AEROBIC AND ANAEROBIC Blood Culture  adequate volume   Culture  Setup Time   Final    GRAM NEGATIVE RODS IN BOTH AEROBIC AND ANAEROBIC BOTTLES CRITICAL VALUE NOTED.  VALUE IS CONSISTENT WITH PREVIOUSLY REPORTED AND CALLED VALUE. CRITICAL RESULT CALLED TO, READ BACK BY AND VERIFIED WITH: Cathlyn Parsons [id number] @0815  FH Performed at [facility], [address], [place], [place] [address]    Culture PENDING  Incomplete   Report Status PENDING  Incomplete     Terri Piedra, NP [facility]  03/20/2022  2:08 PM

## 2022-03-20 NOTE — ED Notes (Signed)
Pt has been resting and not eating.

## 2022-03-21 LAB — GLUCOSE, CAPILLARY
Glucose-Capillary: 123 mg/dL — ABNORMAL HIGH (ref 70–99)
Glucose-Capillary: 190 mg/dL — ABNORMAL HIGH (ref 70–99)
Glucose-Capillary: 180 mg/dL — ABNORMAL HIGH (ref 70–99)
Glucose-Capillary: 197 mg/dL — ABNORMAL HIGH (ref 70–99)

## 2022-03-21 LAB — URINE CULTURE

## 2022-03-21 MED ORDER — LINAGLIPTIN 5 MG PO TABS
5.0000 mg | ORAL_TABLET | Freq: Every day | ORAL | Status: DC
  Administered 2022-03-21 – 2022-03-30 (×10): 5 mg via ORAL
  Filled 2022-03-21 (×10): qty 1

## 2022-03-21 MED ORDER — IRBESARTAN 75 MG PO TABS
75.0000 mg | ORAL_TABLET | Freq: Every day | ORAL | Status: DC
Start: 2022-03-21 — End: 2022-03-30
  Administered 2022-03-21 – 2022-03-30 (×10): 75 mg via ORAL
  Filled 2022-03-21 (×10): qty 1

## 2022-03-21 NOTE — Progress Notes (Signed)
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 Berniece Salines ?]  And he does self catheterizes-prior ESBL E. coli bacteremia's-last admit 5/20-5/25 ESBL E. coli treated with ertapenem to complete 14-day course ending 6/3 HLD IDDM last A1c 8.3 class I obesity BMI 32 Prior acute cholecystitis 2016 possible choledocholithiasis,   Presented to [facility] emergency room with vomiting + fever-feeling poorly went to PCP office with allegedly 6/7 felt fair-patient had apparently been reusing his catheters?   Found to be septic on admission with blood pressures in the 80s fever and leukocytosis concerning for sepsis CT abdomen pelvis showed features concerning for cystitis and probable acute right hip fracture given fluid resuscitation 22 French Foley placed  Sodium 132 bicarb 18 BUN/creatinine 21/1.5 (baseline 20/1.1) WBC 18 hemoglobin 9.5 platelet 428 UA 1+ leukocytes 2+ protein Blood culture X1 bottle gram-negative rods + yeast  Hospital-Problem based course  Recurrent ESBL E. coli?  Secondary to self catheterizations?  Secondary to bladder diverticulum D/W Dr. Benancio Deeds who will make Dr. Marlou Porch aware of this as patient may need nonemergent cystoscopy Continuing for now meropenem-gram-negative rods on blood culture 6/8 await finalization-blood cultures have been repeated 6/26 defer to ID duration treatment Labs not drawn today but will repeat them in the morning POC to ensure proper plan is in place with regards to catheters as he may need supplies if not affordable AKI on admission  125  cc/H-->75 cc/H today Prior to admission was on valsartan and HCTZ which has been stopped-we will resume only ARB component  T11 paraplegia with thoracic T8-9 and L1 posterior bone grafting 2013 Routine management and follow-up  Coincidental right hip fracture Spent some time with family-showed the wife pictures of the hip on the CT scan showing likely area of fracture-mentioned the rationale for not repairing as would not change mobility outcomes Discussed results with daughter on phone DM ty ii A1c recently about 8-CBG ranging from 126-145 Continuing sliding scale sensitive with 15 units of Semglee considering resumption of metformin 1000 twice daily once Creatinine is better-can resume Sitagliptin 100 daily today   DVT prophylaxis: Lovenox Code Status: Full Family Communication: Discussed with wife at the bedside and daughter on phone Disposition:  Status is: Inpatient Remains inpatient appropriate because: Needs further work-up per infectious disease   Consultants:  none  Procedures:   Antimicrobials:     Subjective:  Discussed via telemetry interpreter  Doing fair-nursing reports patient unable to afford antibiotics-reached out already to [facility] regarding catheters Spoke with daughter on phone as well No further fevers chills   Objective: Vitals:   03/20/22 1300 03/20/22 1938 03/20/22 2349 03/21/22 0423  BP: (!) 157/73 (!) 127/51 (!) 141/68 (!) 144/72  Pulse: (!) 111 90 100 (!) 102  Resp: 18 16 20 20   Temp:  98.4 F (36.9 C) 100 F (37.8 C) 98.4 F (36.9 C)  TempSrc:  Oral Oral Oral  SpO2: 99% 98% 97% 98%  Weight:        Intake/Output Summary (Last 24 hours) at 03/21/2022 0841 Last data filed at 03/21/2022 0600 Gross per 24 hour  Intake 2406.65 ml  Output 1100 ml  Net 1306.65 ml  Filed Weights   03/20/22 0700  Weight: 87 kg    Examination:  Alert coherent no distress Chest clear no rales rhonchi wheeze S1-S2 no murmur no rub no gallop ROM intact no  focal deficit Abdomen soft no rebound no guarding Neurologically intact--moving all 4 limbs equally  Data Reviewed: personally reviewed   CBC    Component Value Date/Time   WBC 20.2 (H) 03/20/2022 0340   RBC 2.77 (L) 03/20/2022 0340   HGB 7.8 (L) 03/20/2022 0340   HGB 13.0 05/20/2020 1058   HCT 24.0 (L) 03/20/2022 0340   HCT 39.7 05/20/2020 1058   PLT 361 03/20/2022 0340   PLT 224 08/30/2019 0948   MCV 86.6 03/20/2022 0340   MCV 90 05/20/2020 1058   MCH 28.2 03/20/2022 0340   MCHC 32.5 03/20/2022 0340   RDW 13.7 03/20/2022 0340   RDW 13.4 05/20/2020 1058   LYMPHSABS 0.4 (L) 03/19/2022 1612   LYMPHSABS 1.8 05/20/2020 1058   MONOABS 0.5 03/19/2022 1612   EOSABS 0.0 03/19/2022 1612   EOSABS 0.1 05/20/2020 1058   BASOSABS 0.0 03/19/2022 1612   BASOSABS 0.0 05/20/2020 1058      Latest Ref Rng & Units 03/20/2022    3:40 AM 03/19/2022    4:12 PM 03/16/2022   10:24 AM  CMP  Glucose 70 - 99 mg/dL 952126  [id number]  [id number]   BUN 6 - 20 mg/dL 17  21  20    Creatinine 0.61 - 1.24 mg/dL 4.011.27  0.271.52  2.531.10   Sodium 135 - 145 mmol/L 132  132  128   Potassium 3.5 - 5.1 mmol/L 3.8  4.3  4.5   Chloride 98 - 111 mmol/L 105  100  96   CO2 22 - 32 mmol/L 20  18  19    Calcium 8.9 - 10.3 mg/dL 7.7  8.4  8.5   Total Protein 6.5 - 8.1 g/dL 5.7  7.2    Total Bilirubin 0.3 - 1.2 mg/dL 0.4  1.0    Alkaline Phos 38 - 126 U/L 169  181    AST 15 - 41 U/L 23  24    ALT 0 - 44 U/L 25  30       Radiology Studies: CT ABDOMEN PELVIS W CONTRAST  Result Date: 03/19/2022 CLINICAL DATA:  Sepsis. EXAM: CT ABDOMEN AND PELVIS WITH CONTRAST TECHNIQUE: Multidetector CT imaging of the abdomen and pelvis was performed using the standard protocol following bolus administration of intravenous contrast. RADIATION DOSE REDUCTION: This exam was performed according to the departmental dose-optimization program which includes automated exposure control, adjustment of the mA and/or kV according to patient size and/or use of iterative  reconstruction technique. CONTRAST:  80mL OMNIPAQUE IOHEXOL 350 MG/ML SOLN COMPARISON:  CT abdomen and pelvis 03/03/2022. FINDINGS: Lower chest: No acute abnormality. Hepatobiliary: Gallbladder surgically absent. 15 mm rounded hypodensity near the gallbladder fossa is unchanged favored as a small cyst. No new liver lesions are identified. No biliary ductal dilatation. Pancreas: Unremarkable. No pancreatic ductal dilatation or surrounding inflammatory changes. Spleen: Normal in size without focal abnormality. Adrenals/Urinary Tract: There is marked diffuse wall thickening of the bladder. 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 There is no hydronephrosis or perinephric fluid. No significant excretion of contrast on delayed imaging. Adrenal glands within normal limits. Stomach/Bowel: Small hiatal hernia is present. Stomach is otherwise within normal limits. Appendix appears normal. No evidence of bowel wall thickening, distention, or inflammatory changes. Vascular/Lymphatic: No significant vascular findings are present. No enlarged abdominal  or pelvic lymph nodes. Reproductive: Prostate is enlarged. Other: Small fat containing left inguinal hernia. Mild presacral edema. No ascites. Musculoskeletal: There is an acute right femoral neck fracture with superolateral displacement of the distal fracture fragment. There is apex anterior angulation. There is joint effusion and small amount of air in the joint space compatible with fracture. There is surrounding soft tissue edema. No dislocation. T9-L1 posterior fusion hardware appears intact. There are healed left-sided rib fractures. IMPRESSION: 1. Acute displaced right femoral neck fracture. 2. Marked diffuse wall thickening of the bladder again seen. This may represent cystitis. Bladder diverticulum again noted. Please correlate clinically. 3. Delayed excretion of contrast from the kidneys. Correlate for medical renal disease. No hydronephrosis. Electronically  Signed   By: Darliss Cheney M.D.   On: 03/19/2022 20:26   DG Chest Port 1 View  Result Date: 03/19/2022 CLINICAL DATA:  Questionable sepsis - evaluate for abnormality EXAM: PORTABLE CHEST 1 VIEW COMPARISON:  Feb 28, 2022 FINDINGS: The heart size and mediastinal contours are within normal limits. No focal consolidation, pleural effusion or vascular congestion. There is minor atelectasis seen at the right lung base and is new. There are thoracolumbar fusion changes seen, stable. IMPRESSION: Minor atelectasis at the right lung base. Otherwise the study is unremarkable. Electronically Signed   By: Marjo Bicker M.D.   On: 03/19/2022 18:04     Scheduled Meds:  atorvastatin  20 mg Oral Q1200   enoxaparin (LOVENOX) injection  40 mg Subcutaneous Q24H   ferrous sulfate  325 mg Oral Daily   insulin aspart  0-9 Units Subcutaneous TID WC   insulin glargine-yfgn  15 Units Subcutaneous QHS   Continuous Infusions:  lactated ringers 125 mL/hr at 03/21/22 0300   meropenem (MERREM) IV 1 g (03/21/22 0522)     LOS: 2 days   Time spent: 32  Rhetta Mura, MD [facility] To contact the attending provider between 7A-7P or the covering provider during after hours 7P-7A, please log into the web site [email or web address] and access using universal [place] password for that web site. If you do not have the password, please call the hospital operator.  03/21/2022, 8:41 AM

## 2022-03-21 NOTE — Plan of Care (Signed)

## 2022-03-22 LAB — BASIC METABOLIC PANEL
BUN: 20 mg/dL (ref 7–25)
CO2: 19 mmol/L — ABNORMAL LOW (ref 20–32)
Calcium: 8.5 mg/dL — ABNORMAL LOW (ref 8.6–10.3)
Chloride: 96 mmol/L — ABNORMAL LOW (ref 98–110)
Creat: 1.1 mg/dL (ref 0.70–1.30)
Glucose, Bld: 176 mg/dL — ABNORMAL HIGH (ref 65–99)
Potassium: 4.5 mmol/L (ref 3.5–5.3)
Sodium: 128 mmol/L — ABNORMAL LOW (ref 135–146)

## 2022-03-22 LAB — CULTURE, BLOOD (ROUTINE X 2)
Special Requests: ADEQUATE
Special Requests: ADEQUATE

## 2022-03-22 LAB — URINE CULTURE: Culture: >=100000 — AB

## 2022-03-22 LAB — CULTURE, BLOOD (SINGLE)
MICRO NUMBER:: 13484774
MICRO NUMBER:: 13484775
Result:: NO GROWTH
SPECIMEN QUALITY:: ADEQUATE

## 2022-03-22 LAB — CBC
HCT: 27.5 % — ABNORMAL LOW (ref 38.5–50.0)
Hemoglobin: 8.9 g/dL — ABNORMAL LOW (ref 13.2–17.1)
MCH: 27.6 pg (ref 27.0–33.0)
MCHC: 32.4 g/dL (ref 32.0–36.0)
MCV: 85.1 fL (ref 80.0–100.0)
MPV: 10.3 fL (ref 7.5–12.5)
Platelets: 567 10*3/uL — ABNORMAL HIGH (ref 140–400)
RBC: 3.23 10*6/uL — ABNORMAL LOW (ref 4.20–5.80)
RDW: 13.3 % (ref 11.0–15.0)
WBC: 12.9 10*3/uL — ABNORMAL HIGH (ref 3.8–10.8)

## 2022-03-22 LAB — GLUCOSE, CAPILLARY
Glucose-Capillary: 127 mg/dL — ABNORMAL HIGH (ref 70–99)
Glucose-Capillary: 189 mg/dL — ABNORMAL HIGH (ref 70–99)
Glucose-Capillary: 177 mg/dL — ABNORMAL HIGH (ref 70–99)
Glucose-Capillary: 168 mg/dL — ABNORMAL HIGH (ref 70–99)

## 2022-03-22 LAB — SEDIMENTATION RATE: Sed Rate: 125 mm/h — ABNORMAL HIGH (ref 0–20)

## 2022-03-22 LAB — C-REACTIVE PROTEIN: CRP: 229.2 mg/L — ABNORMAL HIGH (ref <8.0)

## 2022-03-22 NOTE — Progress Notes (Signed)
    [facility] for Infectious Disease    Date of Admission:  03/19/2022   Total days of antibiotics 4           ID: John Meza is a 61 y.o. male with  esbl ecoli bacteremia secondary to recurrent cystitis Active Problems:   HLD (hyperlipidemia)   Paraplegia (HCC)   Neurogenic bladder   Insulin dependent type 2 diabetes mellitus, controlled ([place])   Essential hypertension   Lower urinary tract infectious disease   Sepsis ([place])    Subjective: Still having low grade fever and rigors yesterday. Improved this morning  Ros: no nausea and vomiting/diarrhea or abdominal pain + right had swelling  Medications:   atorvastatin  20 mg Oral Q1200   enoxaparin (LOVENOX) injection  40 mg Subcutaneous Q24H   ferrous sulfate  325 mg Oral Daily   insulin aspart  0-9 Units Subcutaneous TID WC   insulin glargine-yfgn  15 Units Subcutaneous QHS   irbesartan  75 mg Oral Daily   linagliptin  5 mg Oral Daily    Objective: Vital signs in last 24 hours: Temp:  [98.6 F (37 C)-101 F (38.3 C)] 100.2 F (37.9 C) (06/11 1251) Pulse Rate:  [92-97] 97 (06/11 1251) Resp:  [18-20] 19 (06/11 1251) BP: (121-156)/(64-72) 148/65 (06/11 1251) SpO2:  [98 %-99 %] 98 % (06/10 2017) Physical Exam  Constitutional: He is oriented to person, place, and time. He appears well-developed and well-nourished. No distress.  HENT:  Mouth/Throat: Oropharynx is clear and moist. No oropharyngeal exudate.  Cardiovascular: Normal rate, regular rhythm and normal heart sounds. Exam reveals no gallop and no friction rub.  No murmur heard.  Pulmonary/Chest: Effort normal and breath sounds normal. No respiratory distress. He has no wheezes.  Abdominal: Soft. Bowel sounds are normal. He exhibits no distension. There is no tenderness.  [id number] swelling to right hand Neurological: He is alert and oriented to person, place, and time. Paraplegia 0/5 muscle strength Skin: Skin is warm and dry. No rash noted. No  erythema.  Psychiatric: He has a normal mood and affect. His behavior is normal.    Lab Results Recent Labs    03/19/22 1612 03/20/22 0340  WBC 18.3* 20.2*  HGB 9.5* 7.8*  HCT 29.3* 24.0*  NA 132* 132*  K 4.3 3.8  CL 100 105  CO2 18* 20*  BUN 21* 17  CREATININE 1.52* 1.27*   Liver Panel Recent Labs    03/19/22 1612 03/20/22 0340  PROT 7.2 5.7*  ALBUMIN 2.2* 1.8*  AST 24 23  ALT 30 25  ALKPHOS 181* 169*  BILITOT 1.0 0.4   Sedimentation Rate No results for input(s): "ESRSEDRATE" in the last 72 hours. C-Reactive Protein No results for input(s): "CRP" in the last 72 hours.  Microbiology: reviewed Studies/Results: No results found.   Assessment/Plan: Recurrent ecoli bacteremia associated with uti = still not defervesced as quickly as one would expect. Day 4 of abtx today with leukocytosis worsening. Recommend to get TTE and mri of lumbar spine  Leukocytosis = will continue to follow  Ongoing fever= may have secondary nidus of infection  [facility] Pager: [phone]  03/22/2022, 1:19 PM

## 2022-03-22 NOTE — Progress Notes (Signed)
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 Berniece Salines ?]  And he does self catheterizes-prior ESBL E. coli bacteremia's-last admit 5/20-5/25 ESBL E. coli treated with ertapenem to complete 14-day course ending 6/3 HLD IDDM last A1c 8.3 class I obesity BMI 32 Prior acute cholecystitis 2016 possible choledocholithiasis,   Presented to [facility] emergency room with vomiting + fever-feeling poorly went to PCP office with allegedly 6/7 felt fair-patient had apparently been reusing his catheters?   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 coli?  Secondary to self catheterizations?  Secondary to bladder diverticulum D/W Dr. Benancio Deeds 6/9 --recommends treatment of urinary infection nonemergent cystoscopy in office and outpatient follow-up Continuing meropenem-gram-negative rods on blood culture 6/8 await finalization-blood cultures 6/26 show persisiting e.coli--- he continues to also have low-grade fever defer to ID duration treatment TOC  consulted for affordability of both antibiotics as well as catheters AKI on admission  125 cc/H-->75 cc/H today-continue fluids today PTA valsartan and HCTZ  stopped-only irbesartan 75 now Dilutional anemia Normocytic and has been receiving fluids-can probably DC fluids if creatinine stabilizes over the next 24 hours T11 paraplegia with thoracic T8-9 and L1 posterior bone grafting 2013 Routine management and follow-up  Coincidental right hip fracture D/w wife + pictures of the hip on the CT ikely area of fracture-mentioned the rationale for not repairing as would not change mobility outcomes Discussed results with daughter on phone DM ty ii A1c recently about 8-CBG ranging from 130-190 Continuing sliding scale sensitive with 15 units of Semglee considering resumption of metformin 1000 twice daily once Creatinine is better-can resume Sitagliptin 100 daily today   DVT prophylaxis: Lovenox Code Status: Full Family Communication: Discussed with wife at the bedside and daughter on phone Disposition:  Status is: Inpatient Remains inpatient appropriate because: Needs further work-up per infectious disease   Consultants:  none  Procedures:   Antimicrobials:   Meropenem    Subjective:  Discussed via telemetry interpreter  no low-grade fever overnight No chills no rigors No chest pain admit   Objective: Vitals:   03/21/22 2017 03/22/22 0025 03/22/22 0400 03/22/22 0800  BP: 121/64 (!) 156/72 125/68 133/66  Pulse: 94 95 96 93  Resp: 20 20 20 18   Temp: 98.6 F (37 C) 100.3 F (37.9 C) 98.8 F (37.1 C)   TempSrc: Oral Oral Oral Oral  SpO2: 98%     Weight:      Height:        Intake/Output Summary (Last 24 hours) at 03/22/2022 1037 Last data filed at 03/22/2022 0600 Gross per 24 hour  Intake 2051.25 ml  Output 2100 ml  Net -48.75 ml    Filed  Weights   03/20/22 0700  Weight: 87 kg    Examination:  Alert coherent no distress Chest clear no rales rhonchi  wheeze S1-S2 no murmur no rub no gallop and is predominantly sinus rhythm ROM intact no focal deficit Abdomen soft no rebound no guarding Neurologically intact--moving  upper extremities well however lower extremities have prior limitations  Data Reviewed: personally reviewed   CBC    Component Value Date/Time   WBC 20.2 (H) 03/20/2022 0340   RBC 2.77 (L) 03/20/2022 0340   HGB 7.8 (L) 03/20/2022 0340   HGB 13.0 05/20/2020 1058   HCT 24.0 (L) 03/20/2022 0340   HCT 39.7 05/20/2020 1058   PLT 361 03/20/2022 0340   PLT 224 08/30/2019 0948   MCV 86.6 03/20/2022 0340   MCV 90 05/20/2020 1058   MCH 28.2 03/20/2022 0340   MCHC 32.5 03/20/2022 0340   RDW 13.7 03/20/2022 0340   RDW 13.4 05/20/2020 1058   LYMPHSABS 0.4 (L) 03/19/2022 1612   LYMPHSABS 1.8 05/20/2020 1058   MONOABS 0.5 03/19/2022 1612   EOSABS 0.0 03/19/2022 1612   EOSABS 0.1 05/20/2020 1058   BASOSABS 0.0 03/19/2022 1612   BASOSABS 0.0 05/20/2020 1058      Latest Ref Rng & Units 03/20/2022    3:40 AM 03/19/2022    4:12 PM 03/16/2022   10:24 AM  CMP  Glucose 70 - 99 mg/dL 126  168  176   BUN 6 - 20 mg/dL 17  21  20    Creatinine 0.61 - 1.24 mg/dL 1.27  1.52  1.10   Sodium 135 - 145 mmol/L 132  132  128   Potassium 3.5 - 5.1 mmol/L 3.8  4.3  4.5   Chloride 98 - 111 mmol/L 105  100  96   CO2 22 - 32 mmol/L 20  18  19    Calcium 8.9 - 10.3 mg/dL 7.7  8.4  8.5   Total Protein 6.5 - 8.1 g/dL 5.7  7.2    Total Bilirubin 0.3 - 1.2 mg/dL 0.4  1.0    Alkaline Phos 38 - 126 U/L 169  181    AST 15 - 41 U/L 23  24    ALT 0 - 44 U/L 25  30       Radiology Studies: No results found.   Scheduled Meds:  atorvastatin  20 mg Oral Q1200   enoxaparin (LOVENOX) injection  40 mg Subcutaneous Q24H   ferrous sulfate  325 mg Oral Daily   insulin aspart  0-9 Units Subcutaneous TID WC   insulin glargine-yfgn  15 Units Subcutaneous QHS   irbesartan  75 mg Oral Daily   linagliptin  5 mg Oral Daily   Continuous Infusions:  lactated  ringers 75 mL/hr at 03/22/22 0100   meropenem (MERREM) IV 1 g (03/22/22 0536)     LOS: 3 days   Time spent: [place], MD [facility] To contact the attending provider between 7A-7P or the covering provider during after hours 7P-7A, please log into the web site [email or web address] and access using universal [place] password for that web site. If you do not have the password, please call the hospital operator.  03/22/2022, 10:37 AM

## 2022-03-22 NOTE — Progress Notes (Incomplete)
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 Berniece Salines ?]  And he does self catheterizes-prior ESBL E. coli bacteremia's-last admit 5/20-5/25 ESBL E. coli treated with ertapenem to complete 14-day course ending 6/3 HLD IDDM last A1c 8.3 class I obesity BMI 32 Prior acute cholecystitis 2016 possible choledocholithiasis,   Presented to [facility] emergency room with vomiting + fever-feeling poorly went to PCP office with allegedly 6/7 felt fair-patient had apparently been reusing his catheters?   Found to be septic on admission with blood pressures in the 80s fever and leukocytosis concerning for sepsis CT abdomen pelvis showed features concerning for cystitis and probable acute right hip fracture given fluid resuscitation 22 French Foley placed  Sodium 132 bicarb 18 BUN/creatinine 21/1.5 (baseline 20/1.1) WBC 18 hemoglobin 9.5 platelet 428 UA 1+ leukocytes 2+ protein Blood culture X1 bottle gram-negative rods + yeast  Hospital-Problem based course  Recurrent ESBL E. coli?  Secondary to self catheterizations?  Secondary to bladder diverticulum D/W Dr. Benancio Deeds who will make Dr. Marlou Porch aware of this as patient may need nonemergent cystoscopy Continuing meropenem-gram-negative rods on blood culture 6/8 await finalization-blood cultures 6/26 show persisiting e.coli defer to ID duration treatment Awaiting labs [facility] consulted for affordability AKI on admission  125 cc/H-->75 cc/H today PTA valsartan and HCTZ  stopped-only irbesartan 75 now T11 paraplegia with thoracic T8-9 and L1  posterior bone grafting 2013 Routine management and follow-up  Coincidental right hip fracture D/w wife + pictures of the hip on the CT ikely area of fracture-mentioned the rationale for not repairing as would not change mobility outcomes Discussed results with daughter on phone DM ty ii A1c recently about 8-CBG ranging from 130-190 Continuing sliding scale sensitive with 15 units of Semglee considering resumption of metformin 1000 twice daily once Creatinine is better-can resume Sitagliptin 100 daily today   DVT prophylaxis: Lovenox Code Status: Full Family Communication: Discussed with wife at the bedside and daughter on phone Disposition:  Status is: Inpatient Remains inpatient appropriate because: Needs further work-up per infectious disease   Consultants:  none  Procedures:   Antimicrobials:     Subjective:  Discussed via telemetry interpreter    Objective: Vitals:   03/21/22 2017 03/22/22 0025 03/22/22 0400 03/22/22 0800  BP: 121/64 (!) 156/72 125/68 133/66  Pulse: 94 95 96 93  Resp: 20 20 20 18   Temp: 98.6 F (37 C) 100.3 F (37.9 C) 98.8 F (37.1 C)   TempSrc: Oral Oral Oral Oral  SpO2: 98%     Weight:      Height:        Intake/Output Summary (Last 24 hours) at 03/22/2022 1020 Last data filed at 03/22/2022 0600 Gross per 24 hour  Intake 2051.25 ml  Output 2100 ml  Net -48.75 ml    Filed Weights   03/20/22 0700  Weight: 87 kg    Examination:  Alert coherent no distress Chest clear no rales rhonchi wheeze S1-S2 no murmur no rub no gallop ROM intact no focal deficit Abdomen soft no rebound no guarding Neurologically intact--moving all 4 limbs equally  Data Reviewed: personally reviewed   CBC    Component Value Date/Time  WBC 20.2 (H) 03/20/2022 0340   RBC 2.77 (L) 03/20/2022 0340   HGB 7.8 (L) 03/20/2022 0340   HGB 13.0 05/20/2020 1058   HCT 24.0 (L) 03/20/2022 0340   HCT 39.7 05/20/2020 1058   PLT 361 03/20/2022 0340   PLT 224  08/30/2019 0948   MCV 86.6 03/20/2022 0340   MCV 90 05/20/2020 1058   MCH 28.2 03/20/2022 0340   MCHC 32.5 03/20/2022 0340   RDW 13.7 03/20/2022 0340   RDW 13.4 05/20/2020 1058   LYMPHSABS 0.4 (L) 03/19/2022 1612   LYMPHSABS 1.8 05/20/2020 1058   MONOABS 0.5 03/19/2022 1612   EOSABS 0.0 03/19/2022 1612   EOSABS 0.1 05/20/2020 1058   BASOSABS 0.0 03/19/2022 1612   BASOSABS 0.0 05/20/2020 1058      Latest Ref Rng & Units 03/20/2022    3:40 AM 03/19/2022    4:12 PM 03/16/2022   10:24 AM  CMP  Glucose 70 - 99 mg/dL 626  948  546   BUN 6 - 20 mg/dL 17  21  20    Creatinine 0.61 - 1.24 mg/dL  2.70  3.50   Sodium 135 - 145 mmol/L 132  132  128   Potassium 3.5 - 5.1 mmol/L 3.8  4.3  4.5   Chloride 98 - 111 mmol/L 105  100  96   CO2 22 - 32 mmol/L 20  18  19    Calcium 8.9 - 10.3 mg/dL 7.7  8.4  8.5   Total Protein 6.5 - 8.1 g/dL 5.7  7.2    Total Bilirubin 0.3 - 1.2 mg/dL 0.4  1.0    Alkaline Phos 38 - 126 U/L 169  181    AST 15 - 41 U/L 23  24    ALT 0 - 44 U/L 25  30       Radiology Studies: No results found.   Scheduled Meds:  atorvastatin  20 mg Oral Q1200   enoxaparin (LOVENOX) injection  40 mg Subcutaneous Q24H   ferrous sulfate  325 mg Oral Daily   insulin aspart  0-9 Units Subcutaneous TID WC   insulin glargine-yfgn  15 Units Subcutaneous QHS   irbesartan  75 mg Oral Daily   linagliptin  5 mg Oral Daily   Continuous Infusions:  lactated ringers 75 mL/hr at 03/22/22 0100   meropenem (MERREM) IV 1 g (03/22/22 0536)     LOS: 3 days   Time spent: 29  05/22/22, MD [facility] To contact the attending provider between 7A-7P or the covering provider during after hours 7P-7A, please log into the web site [email or web address] and access using universal [place] password for that web site. If you do not have the password, please call the hospital operator.  03/22/2022, 10:20 AM

## 2022-03-23 ENCOUNTER — Other Ambulatory Visit (HOSPITAL_COMMUNITY): Payer: No Typology Code available for payment source

## 2022-03-23 ENCOUNTER — Inpatient Hospital Stay (HOSPITAL_COMMUNITY): Payer: Medicaid Other

## 2022-03-23 DIAGNOSIS — B952 Enterococcus as the cause of diseases classified elsewhere: Secondary | ICD-10-CM

## 2022-03-23 LAB — COMPREHENSIVE METABOLIC PANEL
ALT: 29 U/L (ref 0–44)
AST: 34 U/L (ref 15–41)
Albumin: 1.6 g/dL — ABNORMAL LOW (ref 3.5–5.0)
Alkaline Phosphatase: 146 U/L — ABNORMAL HIGH (ref 38–126)
Anion gap: 6 (ref 5–15)
BUN: 6 mg/dL (ref 6–20)
CO2: 23 mmol/L (ref 22–32)
Calcium: 8.2 mg/dL — ABNORMAL LOW (ref 8.9–10.3)
Chloride: 107 mmol/L (ref 98–111)
Creatinine, Ser: 0.73 mg/dL (ref 0.61–1.24)
GFR, Estimated: >60 mL/min (ref >60)
Glucose, Bld: 136 mg/dL — ABNORMAL HIGH (ref 70–99)
Potassium: 3.7 mmol/L (ref 3.5–5.1)
Sodium: 136 mmol/L (ref 135–145)
Total Bilirubin: 0.4 mg/dL (ref 0.3–1.2)
Total Protein: 5.4 g/dL — ABNORMAL LOW (ref 6.5–8.1)

## 2022-03-23 LAB — CBC WITH DIFFERENTIAL/PLATELET
Abs Immature Granulocytes: 0.04 10*3/uL (ref 0.00–0.07)
Basophils Absolute: 0 10*3/uL (ref 0.0–0.1)
Basophils Relative: 1 %
Eosinophils Absolute: 0.1 10*3/uL (ref 0.0–0.5)
Eosinophils Relative: 1 %
HCT: 22.5 % — ABNORMAL LOW (ref 39.0–52.0)
Hemoglobin: 7.3 g/dL — ABNORMAL LOW (ref 13.0–17.0)
Immature Granulocytes: 1 %
Lymphocytes Relative: 16 %
Lymphs Abs: 1.2 10*3/uL (ref 0.7–4.0)
MCH: 27.4 pg (ref 26.0–34.0)
MCHC: 32.4 g/dL (ref 30.0–36.0)
MCV: 84.6 fL (ref 80.0–100.0)
Monocytes Absolute: 0.9 10*3/uL (ref 0.1–1.0)
Monocytes Relative: 12 %
Neutro Abs: 5.3 10*3/uL (ref 1.7–7.7)
Neutrophils Relative %: 69 %
Platelets: 279 10*3/uL (ref 150–400)
RBC: 2.66 MIL/uL — ABNORMAL LOW (ref 4.22–5.81)
RDW: 13.8 % (ref 11.5–15.5)
WBC: 7.5 10*3/uL (ref 4.0–10.5)
nRBC: 0 % (ref 0.0–0.2)

## 2022-03-23 LAB — GLUCOSE, CAPILLARY
Glucose-Capillary: 127 mg/dL — ABNORMAL HIGH (ref 70–99)
Glucose-Capillary: 264 mg/dL — ABNORMAL HIGH (ref 70–99)
Glucose-Capillary: 196 mg/dL — ABNORMAL HIGH (ref 70–99)

## 2022-03-23 MED ORDER — GADOBUTROL 1 MMOL/ML IV SOLN
7.0000 mL | Freq: Once | INTRAVENOUS | Status: AC | PRN
Start: 2022-03-23 — End: 2022-03-23
  Administered 2022-03-23: 7 mL via INTRAVENOUS

## 2022-03-23 NOTE — Progress Notes (Signed)
[place] [facility]  Date of Admission:  03/19/2022      Total days of antibiotics 5  Meropenem 6/08 >. Current           ASSESSMENT: John Meza is a 61 y.o. male with paraplegia re-admitted with relapsing bacteremia due to ESBL producing E coli. His fevers seem to be improving with last fever 06/11 PM. WBC has normalized. We are awaiting MRI of spine and read on TTE to understand relapsing infection.  CT A/P 6/08 reveal acute displaced right femoral neck fracture, marked diffuse thickening of the bladder with diverticulum.     PLAN: Continue Merrem  Follow MRI and TTE    Active Problems:   HLD (hyperlipidemia)   Paraplegia (HCC)   Neurogenic bladder   Insulin dependent type 2 diabetes mellitus, controlled (HCC)   Essential hypertension   Lower urinary tract infectious disease   Sepsis (HCC)    atorvastatin  20 mg Oral Q1200   enoxaparin (LOVENOX) injection  40 mg Subcutaneous Q24H   ferrous sulfate  325 mg Oral Daily   insulin aspart  0-9 Units Subcutaneous TID WC   insulin glargine-yfgn  15 Units Subcutaneous QHS   irbesartan  75 mg Oral Daily   linagliptin  5 mg Oral Daily    SUBJECTIVE: Family to help interpret with spanish today.  Feeling improved. No significant pain to declare. Fevers seem to be trending down. WBC down. Still with some chills/sweating.   Review of Systems: Review of Systems  Constitutional:  Positive for chills, diaphoresis and fever.  Respiratory: Negative.    Cardiovascular: Negative.   Gastrointestinal:  Negative for abdominal pain, nausea and vomiting.  Genitourinary:        Neurogenic bladder 2/2 paraplegic   Skin:  Negative for rash.    Allergies  Allergen Reactions   Macrobid [Nitrofurantoin] Itching and Rash    OBJECTIVE: Vitals:   03/23/22 0400 03/23/22 0825 03/23/22 1200 03/23/22 1532  BP: 127/74 (!) 146/73 (!) 146/78 (!) 159/73  Pulse:  74 88 95  Resp: (!) 22 14 20 20    Temp: 99 F (37.2 C) 98.6 F (37 C) 98.6 F (37 C) 98.8 F (37.1 C)  TempSrc: Oral Oral Oral Oral  SpO2:      Weight:      Height:       Body mass index is 32.92 kg/m.  Physical Exam Vitals reviewed.  Constitutional:      Appearance: Normal appearance. He is not ill-appearing.     Comments: Resting in bed with family around.   HENT:     Head: Normocephalic.     Mouth/Throat:     Mouth: Mucous membranes are moist.     Pharynx: Oropharynx is clear.  Eyes:     General: No scleral icterus. Cardiovascular:     Rate and Rhythm: Normal rate and regular rhythm.  Pulmonary:     Effort: Pulmonary effort is normal.  Musculoskeletal:        General: Normal range of motion.     Cervical back: Normal range of motion.  Skin:    Coloration: Skin is not jaundiced or pale.  Neurological:     Mental Status: He is alert and oriented to person, place, and time.  Psychiatric:        Mood and Affect: Mood normal.        Judgment: Judgment normal.     Lab Results Lab Results  Component  Value Date   WBC 7.5 03/23/2022   HGB 7.3 (L) 03/23/2022   HCT 22.5 (L) 03/23/2022   MCV 84.6 03/23/2022   PLT 279 03/23/2022    Lab Results  Component Value Date   CREATININE 0.73 03/23/2022   BUN 6 03/23/2022   NA 136 03/23/2022   K 3.7 03/23/2022   CL 107 03/23/2022   CO2 23 03/23/2022    Lab Results  Component Value Date   ALT 29 03/23/2022   AST 34 03/23/2022   ALKPHOS 146 (H) 03/23/2022   BILITOT 0.4 03/23/2022     Microbiology: Recent Results (from the past 240 hour(s))  Blood culture (routine single)     Status: None   Collection Time: 03/16/22 10:24 AM   Specimen: Blood  Result Value Ref Range Status   MICRO NUMBER: [id number]  Final   SPECIMEN QUALITY: Suboptimal  Final   Source BLOOD 1  Final   STATUS: FINAL  Final   Result:   Final    No growth after 5 days Inspection of blood culture bottles indicates that an inadequate volume of blood may have been collected for the  detection of sepsis.   COMMENT: Aerobic and anaerobic bottle received.  Final  Blood culture (routine single)     Status: None   Collection Time: 03/16/22 10:24 AM   Specimen: Blood  Result Value Ref Range Status   MICRO NUMBER: [id number]  Final   SPECIMEN QUALITY: Adequate  Final   Source BLOOD 2  Final   STATUS: FINAL  Final   Result: No growth after 5 days  Final   COMMENT: Aerobic and anaerobic bottle received.  Final  Urine Culture     Status: None   Collection Time: 03/19/22  2:15 PM   Specimen: Urine   UR  Result Value Ref Range Status   Urine Culture, Routine Final report  Final   Organism ID, Bacteria Comment  Final    Comment: Mixed urogenital flora 25,000-50,000 colony forming units per mL   Urine Culture     Status: Abnormal   Collection Time: 03/19/22  4:04 PM   Specimen: In/Out Cath Urine  Result Value Ref Range Status   Specimen Description IN/OUT CATH URINE  Final   Special Requests   Final    NONE Performed at [facility], [name] [address] [place], [place] [address]    Culture >=100,000 COLONIES/mL YEAST (A)  Final   Report Status 03/22/2022 FINAL  Final  Blood Culture (routine x 2)     Status: Abnormal   Collection Time: 03/19/22  4:13 PM   Specimen: BLOOD LEFT FOREARM  Result Value Ref Range Status   Specimen Description BLOOD LEFT FOREARM  Final   Special Requests   Final    BOTTLES DRAWN AEROBIC AND ANAEROBIC Blood Culture adequate volume   Culture  Setup Time   Final    GRAM NEGATIVE RODS ANAEROBIC BOTTLE ONLY CRITICAL RESULT CALLED TO, READ BACK BY AND VERIFIED WITH: Cathlyn Parsons [id number] @0813  FH Performed at [facility], [place] [address] [place], [place] [address]    Culture (A)  Final    ESCHERICHIA COLI Confirmed Extended Spectrum Beta-Lactamase Producer (ESBL).  In bloodstream infections from ESBL organisms, carbapenems are preferred over piperacillin/tazobactam. They are shown to have a lower risk of mortality.    Report  Status 03/22/2022 FINAL  Final   Organism ID, Bacteria ESCHERICHIA COLI  Final      Susceptibility  Escherichia coli - MIC*    AMPICILLIN >=32 RESISTANT Resistant     CEFAZOLIN >=64 RESISTANT Resistant     CEFEPIME 1 SENSITIVE Sensitive     CEFTAZIDIME RESISTANT Resistant     CEFTRIAXONE 32 RESISTANT Resistant     CIPROFLOXACIN >=4 RESISTANT Resistant     GENTAMICIN <=1 SENSITIVE Sensitive     IMIPENEM <=0.25 SENSITIVE Sensitive     TRIMETH/SULFA >=320 RESISTANT Resistant     AMPICILLIN/SULBACTAM >=32 RESISTANT Resistant     PIP/TAZO 64 INTERMEDIATE Intermediate     * ESCHERICHIA COLI  Blood Culture ID Panel (Reflexed)     Status: Abnormal   Collection Time: 03/19/22  4:13 PM  Result Value Ref Range Status   Enterococcus faecalis NOT DETECTED NOT DETECTED Final   Enterococcus Faecium NOT DETECTED NOT DETECTED Final   Listeria monocytogenes NOT DETECTED NOT DETECTED Final   Staphylococcus species NOT DETECTED NOT DETECTED Final   Staphylococcus aureus (BCID) NOT DETECTED NOT DETECTED Final   Staphylococcus epidermidis NOT DETECTED NOT DETECTED Final   Staphylococcus lugdunensis NOT DETECTED NOT DETECTED Final   Streptococcus species NOT DETECTED NOT DETECTED Final   Streptococcus agalactiae NOT DETECTED NOT DETECTED Final   Streptococcus pneumoniae NOT DETECTED NOT DETECTED Final   Streptococcus pyogenes NOT DETECTED NOT DETECTED Final   A.calcoaceticus-baumannii NOT DETECTED NOT DETECTED Final   Bacteroides fragilis NOT DETECTED NOT DETECTED Final   Enterobacterales DETECTED (A) NOT DETECTED Final    Comment: Enterobacterales represent a large order of gram negative bacteria, not a single organism. CRITICAL RESULT CALLED TO, READ BACK BY AND VERIFIED WITH: PHARMD E. Hassell Done [id number] @0815  FH    Enterobacter cloacae complex NOT DETECTED NOT DETECTED Final   Escherichia coli DETECTED (A) NOT DETECTED Final    Comment: CRITICAL RESULT CALLED TO, READ BACK BY AND VERIFIED  WITH: PHARMD EHassell Done [id number] @0815  FH    Klebsiella aerogenes NOT DETECTED NOT DETECTED Final   Klebsiella oxytoca NOT DETECTED NOT DETECTED Final   Klebsiella pneumoniae NOT DETECTED NOT DETECTED Final   Proteus species NOT DETECTED NOT DETECTED Final   Salmonella species NOT DETECTED NOT DETECTED Final   Serratia marcescens NOT DETECTED NOT DETECTED Final   Haemophilus influenzae NOT DETECTED NOT DETECTED Final   Neisseria meningitidis NOT DETECTED NOT DETECTED Final   Pseudomonas aeruginosa NOT DETECTED NOT DETECTED Final   Stenotrophomonas maltophilia NOT DETECTED NOT DETECTED Final   Candida albicans NOT DETECTED NOT DETECTED Final   Candida auris NOT DETECTED NOT DETECTED Final   Candida glabrata NOT DETECTED NOT DETECTED Final   Candida krusei NOT DETECTED NOT DETECTED Final   Candida parapsilosis NOT DETECTED NOT DETECTED Final   Candida tropicalis NOT DETECTED NOT DETECTED Final   Cryptococcus neoformans/gattii NOT DETECTED NOT DETECTED Final   CTX-M ESBL DETECTED (A) NOT DETECTED Final    Comment: CRITICAL RESULT CALLED TO, READ BACK BY AND VERIFIED WITH: PHARMD EHassell Done [id number] @0815  FH (NOTE) Extended spectrum beta-lactamase detected. Recommend a carbapenem as initial therapy.      Carbapenem resistance IMP NOT DETECTED NOT DETECTED Final   Carbapenem resistance KPC NOT DETECTED NOT DETECTED Final   Carbapenem resistance NDM NOT DETECTED NOT DETECTED Final   Carbapenem resist OXA 48 LIKE NOT DETECTED NOT DETECTED Final   Carbapenem resistance VIM NOT DETECTED NOT DETECTED Final    Comment: Performed at [facility], [place] [address] [place],  [address]  SARS Coronavirus 2 by RT PCR (hospital order, performed in [facility]  Health hospital lab) *cepheid single result test* Anterior Nasal Swab     Status: None   Collection Time: 03/19/22  4:28 PM   Specimen: Anterior Nasal Swab  Result Value Ref Range Status   SARS Coronavirus 2 by RT PCR NEGATIVE NEGATIVE Final     Comment: (NOTE) SARS-CoV-2 target nucleic acids are NOT DETECTED.  The SARS-CoV-2 RNA is generally detectable in upper and lower respiratory specimens during the acute phase of infection. The lowest concentration of SARS-CoV-2 viral copies this assay can detect is 250 copies / mL. A negative result does not preclude SARS-CoV-2 infection and should not be used as the sole basis for treatment or other patient management decisions.  A negative result may occur with improper specimen collection / handling, submission of specimen other than nasopharyngeal swab, presence of viral mutation(s) within the areas targeted by this assay, and inadequate number of viral copies (<250 copies / mL). A negative result must be combined with clinical observations, patient history, and epidemiological information.  Fact Sheet for Patients:   https://www.patel.info/  Fact Sheet for Healthcare Providers: https://hall.com/  This test is not yet approved or  cleared by the [place] FDA and has been authorized for detection and/or diagnosis of SARS-CoV-2 by FDA under an Emergency Use Authorization (EUA).  This EUA will remain in effect (meaning this test can be used) for the duration of the COVID-19 declaration under Section 564(b)(1) of the Act, 21 U.S.C. section 360bbb-3(b)(1), unless the authorization is terminated or revoked sooner.  Performed at [facility], [place] [address] [place], [place] [address]   Blood Culture (routine x 2)     Status: Abnormal   Collection Time: 03/19/22  5:59 PM   Specimen: BLOOD  Result Value Ref Range Status   Specimen Description BLOOD SITE NOT SPECIFIED  Final   Special Requests   Final    BOTTLES DRAWN AEROBIC AND ANAEROBIC Blood Culture adequate volume   Culture  Setup Time   Final    GRAM NEGATIVE RODS IN BOTH AEROBIC AND ANAEROBIC BOTTLES CRITICAL VALUE NOTED.  VALUE IS CONSISTENT WITH PREVIOUSLY REPORTED  AND CALLED VALUE. CRITICAL RESULT CALLED TO, READ BACK BY AND VERIFIED WITH: PHARMD E. Hassell Done [id number] @0815  FH    Culture (A)  Final    ESCHERICHIA COLI SUSCEPTIBILITIES PERFORMED ON PREVIOUS CULTURE WITHIN THE LAST 5 DAYS. Performed at [facility], [place] [address] [place], [place] [address]    Report Status 03/22/2022 FINAL  Final  Culture, blood (Routine X 2) w Reflex to ID Panel     Status: None (Preliminary result)   Collection Time: 03/21/22 12:33 AM   Specimen: BLOOD LEFT ARM  Result Value Ref Range Status   Specimen Description BLOOD LEFT ARM  Final   Special Requests   Final    BOTTLES DRAWN AEROBIC ONLY Blood Culture results may not be optimal due to an inadequate volume of blood received in culture bottles   Culture   Final    NO GROWTH 2 DAYS Performed at [facility], [place] [address] [place], [place] [address]    Report Status PENDING  Incomplete    Janene Madeira, MSN, NP-C [facility] [email or web address] Pager: [phone] Office: [phone] [facility] Main Line: [place] Communication Welcome

## 2022-03-23 NOTE — TOC Initial Note (Signed)
Transition of Care [facility]) - Initial/Assessment Note    Patient Details  Name: John Meza MRN: [id number] Date of Birth: 12/05/1960  Transition [facility] Care [facility] CM/SW Contact:    Bess Kinds, RN Phone Number: [phone] 03/23/2022, 3:27 PM  Clinical Narrative:      Spoke with patient and spouse, John Meza, at the bedside using telephonic Language [employer] to assist with communication during discussion for eventual post acute transition. Permission provided to discuss patient needs with daughters as well.   Confirmed that patient is followed by [facility] - PCP is Dr. [name]. Patient uses pharmacy at [facility].      Patient was provided 4 sample urinary catheters from urologist about a year ago. Spouse has been washing catheters with water and antibacterial soap. Noted documentation of communications with Anise Salvo at [facility] [phone] and Robyne Peers, [facility] [phone]. Patient Assistance Fund anticipated/hopeful to cover cost of catheter supplies, but Erskine Squibb needs to know cost of supplies in order to request assistance. Left voicemail for Anise Salvo to further discuss patient needs and process of providing supplies.   Discussed DME - patient has hospital bed, wheelchair, and bsc.   Spouse provides transportation to medical appointments stating that she is able to assist patient with transfers.   Spoke wth current attending. Anticipate need for long term antibiotics. Spoke with Pam at [employer] for possible charity for long term antibiotic needs. Pam to follow.   Spoke with Casimiro Needle at [employer]. Referral accepted for LOG for [facility] RN IV infusion for anticipated 6-8 visits. LOG approved by Psychologist, educational, Steward Drone.   TOC following for transition needs.          Expected Discharge Plan: Home w Home Health Services Barriers to Discharge: Continued Medical Work up   Patient Goals and CMS Choice Patient states their goals for this  hospitalization and ongoing recovery are:: return home with wife CMS Medicare.gov Compare Post Acute Care list provided to:: Patient Choice offered to / list presented to : Patient, Spouse  Expected Discharge Plan and Services Expected Discharge Plan: Home w Home Health Services In-house Referral: Financial Counselor Discharge Planning Services: CM Consult Post Acute Care Choice: Durable Medical Equipment, Home Health Living arrangements for the past 2 months: Single Family Home                 DME Arranged: Other see comment (urinary catheter supplies) DME Agency: [employer] Date DME Agency Contacted: 03/23/22 Time DME Agency Contacted: 1521 Representative spoke with at DME Agency: left message for Anise Salvo at [facility] [phone] [facility] Arranged: RN, IV Antibiotics HH Agency: Surveyor, mining, Other - See comment ([facility] for [facility] RN for home infusion) Date HH Agency Contacted: 03/23/22 Time HH Agency Contacted: 1522 Representative spoke with at [facility] Agency: Pam with [facility] and Casimiro Needle at [employer]  Prior Living Arrangements/Services Living arrangements for the past 2 months: Single Family Home Lives with:: Spouse, Self Patient language and need for interpreter reviewed:: Yes Do you feel safe going back to the place where you live?: Yes      Need for Family Participation in Patient Care: Yes (Comment) Care giver support system in place?: Yes (comment) Current home services: DME (wheelchair, hospital bed, bsc) Criminal Activity/Legal Involvement Pertinent to Current Situation/Hospitalization: No - Comment as needed  Activities of Daily Living Home Assistive Devices/Equipment: Wheelchair ADL Screening (condition at time of admission) Patient's cognitive ability adequate to safely complete daily activities?: No Is the patient deaf or have  difficulty hearing?: No Does the patient have difficulty seeing, even when wearing glasses/contacts?: No Does the patient have difficulty  concentrating, remembering, or making decisions?: No Patient able to express need for assistance with ADLs?: Yes Does the patient have difficulty dressing or bathing?: Yes Independently performs ADLs?: No Does the patient have difficulty walking or climbing stairs?: Yes Weakness of Legs: Both Weakness of Arms/Hands: Both  Permission Sought/Granted Permission sought to share information with : Family Supports    Share Information with NAME: John Meza        Permission granted to share info w Contact Information: wife  Emotional Assessment Appearance:: Appears stated age Attitude/Demeanor/Rapport: Engaged Affect (typically observed): Accepting Orientation: : Oriented to Self, Oriented to Place, Oriented to  Time, Oriented to Situation Alcohol / Substance Use: Not Applicable Psych Involvement: No (comment)  Admission diagnosis:  Paraplegia (HCC) [G82.20] Lower urinary tract infectious disease [N39.0] Closed fracture of neck of right femur, initial encounter (HCC) [S72.001A] Sepsis (HCC) [A41.9] Sepsis without acute organ dysfunction, due to unspecified organism [facility]) [A41.9] Patient Active Problem List   Diagnosis Date Noted   Sepsis (HCC) 03/19/2022   Closed fracture of neck of right femur (HCC)    Pyelonephritis due to Escherichia coli 03/16/2022   Bacteremia 03/16/2022   ESBL (extended spectrum beta-lactamase) producing bacteria infection 03/16/2022   Normocytic anemia 03/03/2022   Lower urinary tract infectious disease 02/28/2022   Wheelchair dependence 12/23/2020   History of cholecystectomy 11/18/2020   Insulin dependent type 2 diabetes mellitus, controlled (HCC)    Hyponatremia    Essential hypertension    Neurogenic bladder 01/06/2012   Neurogenic bowel 01/06/2012   Paraplegia (HCC) 11/16/2011   OBESITY, MODERATE 05/26/2007   HLD (hyperlipidemia) 05/25/2007   PCP:  Storm Frisk, MD Pharmacy:   [facility] [address] [place] [address] Phone: [phone] Fax: [phone]     Social Determinants of Health (SDOH) Interventions    Readmission Risk Interventions     No data to display

## 2022-03-23 NOTE — Progress Notes (Signed)
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 [name] ?]  And he does self catheterizes-prior ESBL E. coli bacteremia's-last admit 5/20-5/25 ESBL E. coli treated with ertapenem to complete 14-day course ending 6/3 HLD IDDM last A1c 8.3 class I obesity BMI 32 Prior acute cholecystitis 2016 possible choledocholithiasis,   Presented to [facility] emergency room with vomiting + fever-feeling poorly went to PCP office with allegedly 6/7 felt fair-patient had apparently been reusing his catheters?   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 coli?  Secondary to self catheterizations?  Secondary to bladder diverticulum D/W Dr. [name] 6/9 --recommends treatment of urinary infection nonemergent cystoscopy in office and outpatient follow-up Continuing meropenem-gram-negative rods on blood culture 6/8 blood  cultures 6/26 show persisiting e.coli--- he continues to also have low-grade fever overnight and again 6/11 Infectious disease recommending MRI lumbar spine, echocardiogram which have yet to be accomplished [facility] consulted for affordability of meds, catheters AKI on admission  125 cc/H-->75 cc/H today-can stop IV fluids 6/12 PTA valsartan and HCTZ  stopped-only irbesartan 75 now Dilutional anemia Normocytic-transfusion threshold below 7.0-monitor labs in a.m. T11 paraplegia with thoracic T8-9 and L1 posterior bone grafting 2013 Routine management and follow-up  Coincidental right hip fracture D/w wife -mentioned the rationale for not repairing as would not change outcomes or mobility DM ty ii A1c recently about 8-CBG ranging from 127-168 Continuing sliding scale sensitive with 15 units of Semglee considering resumption of metformin 1000 twice daily once Creatinine is better-can resume Sitagliptin 100 daily today   DVT prophylaxis: Lovenox Code Status: Full Family Communication: Discussed with wife at the bedside 6/12 Disposition:  Status is: Inpatient Remains inpatient appropriate because:  Needs further work-up per infectious disease   Consultants:  none  Procedures:   Antimicrobials:   Meropenem   Subjective:  Discussed via telemetry interpreter  Seems to be fair no nausea no vomiting no distress Case management had spoke to patient--arranging catheters and long-term antibiotics and appreciative Patient himself is somewhat upbeat he has no distress he is feeling fair no high-grade fevers although low-grade overnight  Objective: Vitals:   03/23/22 0000 03/23/22 0400 03/23/22 0825 03/23/22 1200  BP: 131/73 127/74 (!) 146/73 (!) 146/78  Pulse: 98  74 88  Resp: 20 (!) 22 14 20   Temp: (!) 100.5 F (38.1 C) 99 F (37.2 C) 98.6 F (37 C) 98.6 F (37 C)  TempSrc: Oral Oral Oral Oral  SpO2: 95%     Weight:      Height:  Intake/Output Summary (Last 24 hours) at  03/23/2022 1436 Last data filed at 03/23/2022 1307 Gross per 24 hour  Intake 2424.76 ml  Output 2350 ml  Net 74.76 ml    Filed Weights   03/20/22 0700  Weight: 87 kg    Examination:  Pleasant coherent no distress no wheeze no rales no rhonchi Neck soft supple S1-S2 no murmur ROM intact to upper extremities he is densely paraplegic in the lower extremities CTA B no added sound  Data Reviewed: personally reviewed   CBC    Component Value Date/Time   WBC 7.5 03/23/2022 0828   RBC 2.66 (L) 03/23/2022 0828   HGB 7.3 (L) 03/23/2022 0828   HGB 13.0 05/20/2020 1058   HCT 22.5 (L) 03/23/2022 0828   HCT 39.7 05/20/2020 1058   PLT 279 03/23/2022 0828   PLT 224 08/30/2019 0948   MCV 84.6 03/23/2022 0828   MCV 90 05/20/2020 1058   MCH 27.4 03/23/2022 0828   MCHC 32.4 03/23/2022 0828   RDW 13.8 03/23/2022 0828   RDW 13.4 05/20/2020 1058   LYMPHSABS 1.2 03/23/2022 0828   LYMPHSABS 1.8 05/20/2020 1058   MONOABS 0.9 03/23/2022 0828   EOSABS 0.1 03/23/2022 0828   EOSABS 0.1 05/20/2020 1058   BASOSABS 0.0 03/23/2022 0828   BASOSABS 0.0 05/20/2020 1058      Latest Ref Rng & Units 03/23/2022    8:28 AM 03/20/2022    3:40 AM 03/19/2022    4:12 PM  CMP  Glucose 70 - 99 mg/dL 136  126  168   BUN 6 - 20 mg/dL 6  17  21    Creatinine 0.61 - 1.24 mg/dL 0.73  1.27  1.52   Sodium 135 - 145 mmol/L 136  132  132   Potassium 3.5 - 5.1 mmol/L 3.7  3.8  4.3   Chloride 98 - 111 mmol/L 107  105  100   CO2 22 - 32 mmol/L 23  20  18    Calcium 8.9 - 10.3 mg/dL 8.2  7.7  8.4   Total Protein 6.5 - 8.1 g/dL 5.4  5.7  7.2   Total Bilirubin 0.3 - 1.2 mg/dL 0.4  0.4  1.0   Alkaline Phos 38 - 126 U/L 146  169  181   AST 15 - 41 U/L 34  23  24   ALT 0 - 44 U/L 29  25  30       Radiology Studies: No results found.   Scheduled Meds:  atorvastatin  20 mg Oral Q1200   enoxaparin (LOVENOX) injection  40 mg Subcutaneous Q24H   ferrous sulfate  325 mg Oral Daily   insulin aspart  0-9 Units  Subcutaneous TID WC   insulin glargine-yfgn  15 Units Subcutaneous QHS   irbesartan  75 mg Oral Daily   linagliptin  5 mg Oral Daily   Continuous Infusions:  lactated ringers 75 mL/hr at 03/23/22 0539   meropenem (MERREM) IV 1 g (03/23/22 1405)     LOS: 4 days   Time spent: [place], MD [facility] To contact the attending provider between 7A-7P or the covering provider during after hours 7P-7A, please log into the web site [email or web address] and access using universal [place] password for that web site. If you do not have the password, please call the hospital operator.  03/23/2022, 2:36 PM

## 2022-03-24 ENCOUNTER — Ambulatory Visit: Payer: Self-pay | Admitting: Infectious Diseases

## 2022-03-24 ENCOUNTER — Inpatient Hospital Stay (HOSPITAL_COMMUNITY): Payer: Medicaid Other

## 2022-03-24 DIAGNOSIS — B9623 Unspecified Shiga toxin-producing Escherichia coli [E. coli] (STEC) as the cause of diseases classified elsewhere: Secondary | ICD-10-CM

## 2022-03-24 DIAGNOSIS — N3090 Cystitis, unspecified without hematuria: Secondary | ICD-10-CM

## 2022-03-24 DIAGNOSIS — I38 Endocarditis, valve unspecified: Secondary | ICD-10-CM

## 2022-03-24 LAB — ECHOCARDIOGRAM COMPLETE
AR max vel: 2.51 cm2
AV Area VTI: 3.39 cm2
AV Area mean vel: 2.48 cm2
AV Mean grad: 3 mmHg
AV Peak grad: 5.3 mmHg
Ao pk vel: 1.15 m/s
Area-P 1/2: 3.34 cm2
Height: 64 in
S' Lateral: 2.8 cm
Weight: 3068.8 oz

## 2022-03-24 LAB — BASIC METABOLIC PANEL
Anion gap: 8 (ref 5–15)
BUN: 8 mg/dL (ref 6–20)
CO2: 24 mmol/L (ref 22–32)
Calcium: 8.3 mg/dL — ABNORMAL LOW (ref 8.9–10.3)
Chloride: 101 mmol/L (ref 98–111)
Creatinine, Ser: 0.87 mg/dL (ref 0.61–1.24)
GFR, Estimated: >60 mL/min (ref >60)
Glucose, Bld: 151 mg/dL — ABNORMAL HIGH (ref 70–99)
Potassium: 3.7 mmol/L (ref 3.5–5.1)
Sodium: 133 mmol/L — ABNORMAL LOW (ref 135–145)

## 2022-03-24 LAB — CBC
HCT: 24.8 % — ABNORMAL LOW (ref 39.0–52.0)
Hemoglobin: 8.4 g/dL — ABNORMAL LOW (ref 13.0–17.0)
MCH: 28.5 pg (ref 26.0–34.0)
MCHC: 33.9 g/dL (ref 30.0–36.0)
MCV: 84.1 fL (ref 80.0–100.0)
Platelets: 301 10*3/uL (ref 150–400)
RBC: 2.95 MIL/uL — ABNORMAL LOW (ref 4.22–5.81)
RDW: 13.7 % (ref 11.5–15.5)
WBC: 9.8 10*3/uL (ref 4.0–10.5)
nRBC: 0 % (ref 0.0–0.2)

## 2022-03-24 LAB — GLUCOSE, CAPILLARY
Glucose-Capillary: 141 mg/dL — ABNORMAL HIGH (ref 70–99)
Glucose-Capillary: 216 mg/dL — ABNORMAL HIGH (ref 70–99)
Glucose-Capillary: 155 mg/dL — ABNORMAL HIGH (ref 70–99)
Glucose-Capillary: 164 mg/dL — ABNORMAL HIGH (ref 70–99)

## 2022-03-24 NOTE — Progress Notes (Signed)
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 Berniece Salines ?]  And he does self catheterizes-prior ESBL E. coli bacteremia's-last admit 5/20-5/25 ESBL E. coli treated with ertapenem to complete 14-day course ending 6/3 HLD IDDM last A1c 8.3 class I obesity BMI 32 Prior acute cholecystitis 2016 possible choledocholithiasis,   Presented to [facility] emergency room with vomiting + fever-feeling poorly went to PCP office with allegedly 6/7 felt fair-patient had apparently been reusing his catheters?   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 Myositis (more likely nonspecific edema)- 6/13: echocardiogram negative for vegetation 6/15: plan by cardiology for TEE  Hospital-Problem based course  Recurrent ESBL E. coli?  Secondary to self catheterizations?  Secondary to bladder diverticulum D/W Dr. Benancio Deeds 6/9 --recommends  treatment of urinary infection nonemergent cystoscopy in office and outpatient follow-up with Dr. Berniece Salines of urology as an outpatient-he will set this up at this follow-up and family is aware to call and confirm Continuing meropenem-gram-negative rods on blood culture 6/8 blood cultures 6/9 show persisiting e.coli--- low-grade fevers continue MRI, echo as above-TEE being planned Defer duration of antibiotics to further work-up TOC consulted for affordability of meds, catheters AKI on admission  125 cc/H-->75 cc/H today-can stop IV fluids 6/12 PTA valsartan and HCTZ  stopped-only irbesartan 75 now Dilutional anemia Normocytic-transfusion threshold below 7.0-monitor labs periodically T11 paraplegia with thoracic T8-9 and L1 posterior bone grafting 2013 Routine management and follow-up  Coincidental right hip fracture D/w wife -mentioned the rationale for not repairing as would not change outcomes or mobility DM ty ii A1c recently about 8-CBG ranging from 141-216 SSI ACHS + 15 units of Semglee  considering resumption of metformin 1000 twice daily closer to discharge timing Creatinine is better-can resume Sitagliptin 100 daily today   DVT prophylaxis: Lovenox Code Status: Full Family Communication: no Family present today Disposition:  Status is: Inpatient Remains inpatient appropriate because:  Needs further work-up per infectious disease   Consultants:  none  Procedures:   Antimicrobials:   Meropenem   Subjective:  Discussed via telemetry interpreter  Well no distress Eating drinking no chest pain No back pain Straight leg raise causes mild knee discomfort no back discomfort  Objective: Vitals:   03/24/22 0000 03/24/22 0314 03/24/22 0906 03/24/22 1255  BP:  (!) 142/74 (!) 149/67 127/71  Pulse:  (!) 101 81 75  Resp:  (!) 23 13 15   Temp: 99.2 F (37.3 C) 100.3 F (37.9 C) 98 F (36.7 C) 98.5 F (36.9 C)  TempSrc: Oral Oral Oral Oral  SpO2:  Weight:       Height:        Intake/Output Summary (Last 24 hours) at 03/24/2022 1551 Last data filed at 03/24/2022 1256 Gross per 24 hour  Intake 200 ml  Output 1450 ml  Net -1250 ml    Filed Weights   03/20/22 0700  Weight: 87 kg    Examination:   coherent no distress--patient seems about the same Neck soft supple Chest is clear no added sound no wheeze no rhonchi S1-S2 no murmur ROM intact to upper extremities he is densely paraplegic in the lower extremities Straight leg raise bilaterally is about the same I did not examine his back   Data Reviewed: personally reviewed   CBC    Component Value Date/Time   WBC 9.8 03/24/2022 0147   RBC 2.95 (L) 03/24/2022 0147   HGB 8.4 (L) 03/24/2022 0147   HGB 13.0 05/20/2020 1058   HCT 24.8 (L) 03/24/2022 0147   HCT 39.7 05/20/2020 1058   PLT 301 03/24/2022 0147   PLT 224 08/30/2019 0948   MCV 84.1 03/24/2022 0147   MCV 90 05/20/2020 1058   MCH 28.5 03/24/2022 0147   MCHC 33.9 03/24/2022 0147   RDW 13.7 03/24/2022 0147   RDW 13.4 05/20/2020 1058   LYMPHSABS 1.2 03/23/2022 0828   LYMPHSABS 1.8 05/20/2020 1058   MONOABS 0.9 03/23/2022 0828   EOSABS 0.1 03/23/2022 0828   EOSABS 0.1 05/20/2020 1058   BASOSABS 0.0 03/23/2022 0828   BASOSABS 0.0 05/20/2020 1058      Latest Ref Rng & Units 03/24/2022    1:47 AM 03/23/2022    8:28 AM 03/20/2022    3:40 AM  CMP  Glucose 70 - 99 mg/dL 161151  096136  045126   BUN 6 - 20 mg/dL 8  6  17    Creatinine 0.61 - 1.24 mg/dL 4.090.87  8.110.73  9.141.27   Sodium 135 - 145 mmol/L 133  136  132   Potassium 3.5 - 5.1 mmol/L 3.7  3.7  3.8   Chloride 98 - 111 mmol/L 101  107  105   CO2 22 - 32 mmol/L 24  23  20    Calcium 8.9 - 10.3 mg/dL 8.3  8.2  7.7   Total Protein 6.5 - 8.1 g/dL  5.4  5.7   Total Bilirubin 0.3 - 1.2 mg/dL  0.4  0.4   Alkaline Phos 38 - 126 U/L  146  169   AST 15 - 41 U/L  34  23   ALT 0 - 44 U/L  29  25      Radiology Studies: ECHOCARDIOGRAM COMPLETE  Result Date: 03/24/2022     ECHOCARDIOGRAM REPORT   Patient Name:   John Meza Date of Exam: 03/24/2022 Medical Rec #:  [id number]                  Height:       64.0 in Accession #:    [id number]                 Weight:       191.8 lb Date of Birth:  Oct 03, 1961                  BSA:          1.922 m Patient Age:    60 years                   BP:  127/71 mmHg Patient Gender: M                          HR:           89 bpm. Exam Location:  Inpatient Procedure: 2D Echo, Color Doppler and Cardiac Doppler Indications:     Endocardititis  History:         Patient has no prior history of Echocardiogram examinations.  Sonographer:     Roosvelt Maser RDCS Referring Phys:  [id number] Diagnosing Phys: Epifanio Lesches MD IMPRESSIONS  1. Left ventricular ejection fraction, by estimation, is 60 to 65%. The left ventricle has normal function. The left ventricle has no regional wall motion abnormalities. Left ventricular diastolic parameters were normal.  2. Right ventricular systolic function is normal. The right ventricular size is normal. Tricuspid regurgitation signal is inadequate for assessing PA pressure.  3. The mitral valve is grossly normal. No evidence of mitral valve regurgitation. No evidence of mitral stenosis.  4. The aortic valve was not well visualized. Aortic valve regurgitation is not visualized. No aortic stenosis is present.  5. The inferior vena cava is normal in size with greater than 50% respiratory variability, suggesting right atrial pressure of 3 mmHg. Conclusion(s)/Recommendation(s): No evidence of valvular vegetations on this transthoracic echocardiogram, though technically difficult study with poor visualization of valves. Consider a transesophageal echocardiogram to exclude infective endocarditis if clinically indicated. FINDINGS  Left Ventricle: Left ventricular ejection fraction, by estimation, is 60 to 65%. The left ventricle has normal function. The left ventricle has no regional wall  motion abnormalities. The left ventricular internal cavity size was normal in size. There is  no left ventricular hypertrophy. Left ventricular diastolic parameters were normal. Right Ventricle: The right ventricular size is normal. No increase in right ventricular wall thickness. Right ventricular systolic function is normal. Tricuspid regurgitation signal is inadequate for assessing PA pressure. Left Atrium: Left atrial size was normal in size. Right Atrium: Right atrial size was normal in size. Pericardium: There is no evidence of pericardial effusion. Mitral Valve: The mitral valve is grossly normal. No evidence of mitral valve regurgitation. No evidence of mitral valve stenosis. Tricuspid Valve: The tricuspid valve is grossly normal. Tricuspid valve regurgitation is not demonstrated. Aortic Valve: The aortic valve was not well visualized. Aortic valve regurgitation is not visualized. No aortic stenosis is present. Aortic valve mean gradient measures 3.0 mmHg. Aortic valve peak gradient measures 5.3 mmHg. Aortic valve area, by VTI measures 3.39 cm. Pulmonic Valve: The pulmonic valve was not well visualized. Pulmonic valve regurgitation is not visualized. Aorta: The aortic root is normal in size and structure. Venous: The inferior vena cava is normal in size with greater than 50% respiratory variability, suggesting right atrial pressure of 3 mmHg. IAS/Shunts: The interatrial septum was not well visualized.  LEFT VENTRICLE PLAX 2D LVIDd:         4.70 cm   Diastology LVIDs:         2.80 cm   LV e' medial:    8.38 cm/s LV PW:         0.80 cm   LV E/e' medial:  8.6 LV IVS:        1.00 cm   LV e' lateral:   11.20 cm/s LVOT diam:     2.00 cm   LV E/e' lateral: 6.4 LV SV:         77 LV SV Index:   40 LVOT Area:  3.14 cm  RIGHT VENTRICLE RV Mid diam:    3.00 cm LEFT ATRIUM             Index        RIGHT ATRIUM           Index LA diam:        3.20 cm 1.67 cm/m   RA Area:     13.10 cm LA Vol (A2C):   46.8 ml 24.35  ml/m  RA Volume:   23.70 ml  12.33 ml/m LA Vol (A4C):   55.9 ml 29.09 ml/m LA Biplane Vol: 53.1 ml 27.63 ml/m  AORTIC VALVE AV Area (Vmax):    2.51 cm AV Area (Vmean):   2.48 cm AV Area (VTI):     3.39 cm AV Vmax:           115.00 cm/s AV Vmean:          87.300 cm/s AV VTI:            0.228 m AV Peak Grad:      5.3 mmHg AV Mean Grad:      3.0 mmHg LVOT Vmax:         92.00 cm/s LVOT Vmean:        68.800 cm/s LVOT VTI:          0.246 m LVOT/AV VTI ratio: 1.08  AORTA Ao Root diam: 3.80 cm MITRAL VALVE MV Area (PHT): 3.34 cm    SHUNTS MV Decel Time: 227 msec    Systemic VTI:  0.25 m MV E velocity: 72.00 cm/s  Systemic Diam: 2.00 cm MV A velocity: 74.60 cm/s MV E/A ratio:  0.97 Epifanio Lesches MD Electronically signed by Epifanio Lesches MD Signature Date/Time: 03/24/2022/2:40:25 PM    Final (Updated)    MR Lumbar Spine W Wo Contrast  Result Date: 03/24/2022 CLINICAL DATA:  Initial evaluation for lower back pain, bacteremia. EXAM: MRI LUMBAR SPINE WITHOUT AND WITH CONTRAST TECHNIQUE: Multiplanar and multiecho pulse sequences of the lumbar spine were obtained without and with intravenous contrast. CONTRAST:  47mL GADAVIST GADOBUTROL 1 MMOL/ML IV SOLN COMPARISON:  Prior study from 03/19/2022. FINDINGS: Segmentation: Standard. Lowest well-formed disc space labeled the L5-S1 level. Alignment: Physiologic with preservation of the normal lumbar lordosis. No listhesis. Vertebrae: Remotely healed T11 fracture with mild height loss noted. Patient is status post posterior fusion extending from the partially visualized lower thoracic spine through L1. Vertebral body height otherwise maintained with no other acute or chronic fracture. Bone marrow signal intensity within normal limits. No discrete or worrisome osseous lesions. Minimal reactive endplate change about the L3-4 interspace posteriorly, favored to be degenerative. No other evidence for acute osteomyelitis discitis or septic arthritis. Conus medullaris  and cauda equina: Conus extends to the L1 level. Conus and cauda equina appear normal. No epidural collections or abnormal enhancement. Paraspinal and other soft tissues: There is apparent diffuse edema within the lower posterior paraspinous musculature (series 5, images 14, 1), nonspecific, but could reflect changes of acute myositis given history. No loculated collections. Prominent chronic fatty atrophy about the psoas musculature noted bilaterally. Subcentimeter simple cyst noted within the right kidney, benign in appearance, no follow-up imaging recommended. Visualized visceral structures otherwise unremarkable. Disc levels: T11-12: Unremarkable. T12-L1: Prior posterior fusion.  No canal or foraminal stenosis. L1-2: Mild reactive endplate spurring without significant disc bulge. Mild facet hypertrophy. No significant canal or foraminal stenosis. L2-3: Minimal reactive endplate spurring without significant disc bulge. Mild facet spurring. No canal or foraminal  stenosis. L3-4: Mild disc bulge with disc desiccation. Associated mild reactive endplate spurring. Mild facet hypertrophy. No significant canal or foraminal stenosis. L4-5: Disc desiccation without significant disc bulge. Mild reactive endplate spurring. Mild facet hypertrophy. No canal or foraminal stenosis. L5-S1: Negative interspace. Mild to moderate facet hypertrophy. No canal or foraminal stenosis. IMPRESSION: 1. Diffuse edema within the lower posterior paraspinous musculature, nonspecific, but could reflect changes of acute myositis given provided history. No loculated or drainable fluid collections. 2. No other convincing evidence for acute infection within the lumbar spine. 3. Mild for age degenerative spondylosis as above without significant canal or foraminal stenosis. 4. Chronic T11 fracture with prior posterior thoracolumbar fusion, partially visualized. Electronically Signed   By: Rise Mu M.D.   On: 03/24/2022 03:38      Scheduled Meds:  atorvastatin  20 mg Oral Q1200   enoxaparin (LOVENOX) injection  40 mg Subcutaneous Q24H   ferrous sulfate  325 mg Oral Daily   insulin aspart  0-9 Units Subcutaneous TID WC   insulin glargine-yfgn  15 Units Subcutaneous QHS   irbesartan  75 mg Oral Daily   linagliptin  5 mg Oral Daily   Continuous Infusions:  meropenem (MERREM) IV 1 g (03/24/22 0519)     LOS: 5 days   Time spent: 61  Rhetta Mura, MD [facility] To contact the attending provider between 7A-7P or the covering provider during after hours 7P-7A, please log into the web site [email or web address] and access using universal [place] password for that web site. If you do not have the password, please call the hospital operator.  03/24/2022, 3:51 PM

## 2022-03-24 NOTE — Telephone Encounter (Signed)
Thank you :)

## 2022-03-24 NOTE — Progress Notes (Signed)
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 TEE scheduled for next available on Thursday @ 8am with Dr. Elease Hashimoto. Rhonda Barrett PA-C will be speaking with patient and writing orders after consent. Relayed update of scheduling to [name], NP with ID team.

## 2022-03-24 NOTE — Progress Notes (Addendum)
[facility]  Date of Admission:  03/19/2022      Total days of antibiotics 5  Meropenem 6/08 >. Current           ASSESSMENT: Chevon Laufer is a 61 y.o. male with paraplegia re-admitted with relapsing bacteremia due to ESBL producing E coli. Previously treated with 2 weeks for pyelonephritis and secondary bacteremia. About 10d after completing abx he had recurrent bacteremia event with same organisms. Urinalysis does not show concern for infection contributing and concerned he may have seeding elsewhere.   Fever overnight to 100.3. WBC normalized.  Discussed MRI of spine with non-specific finding of myositis. Cannot clinically correlate with Mr. Capote given paraplegia; no drain able foci or any fluid to sample. Unclear if this represents an early vertebral infection that has been partially treated or normal variant for him. While uncommon, can see gram negative endocarditis and would like to pursue TEE to formally exclude this consideration.  Suspect he will need 4 more weeks for possible early vertebral infection, but further recs to be determined after TEE.   D/W Cardiology and scheduled for Thursday at 0800 am.    PLAN: Continue Merrem  Follow MRI and TTE    Active Problems:   HLD (hyperlipidemia)   Paraplegia (HCC)   Neurogenic bladder   Insulin dependent type 2 diabetes mellitus, controlled (HCC)   Essential hypertension   Lower urinary tract infectious disease   Sepsis (HCC)    atorvastatin  20 mg Oral Q1200   enoxaparin (LOVENOX) injection  40 mg Subcutaneous Q24H   ferrous sulfate  325 mg Oral Daily   insulin aspart  0-9 Units Subcutaneous TID WC   insulin glargine-yfgn  15 Units Subcutaneous QHS   irbesartan  75 mg Oral Daily   linagliptin  5 mg Oral Daily    SUBJECTIVE: Spanish interpretor via tablet used today for visit.   Feeling better but still had sweating episode over night. No new concerns.  Does not notice any changes as to how his back has felt but unable to feel much below nipple line.    Review of Systems: Review of Systems  Constitutional:  Positive for diaphoresis. Negative for chills and fever.  Respiratory: Negative.    Cardiovascular: Negative.   Gastrointestinal:  Negative for abdominal pain, nausea and vomiting.  Genitourinary:        Neurogenic bladder 2/2 paraplegic   Skin:  Negative for rash.    Allergies  Allergen Reactions   Macrobid [Nitrofurantoin] Itching and Rash    OBJECTIVE: Vitals:   03/23/22 2000 03/24/22 0000 03/24/22 0314 03/24/22 0906  BP: (!) 112/51  (!) 142/74 (!) 149/67  Pulse: 93  (!) 101 81  Resp: (!) 21  (!) 23 13  Temp: 98.8 F (37.1 C) 99.2 F (37.3 C) 100.3 F (37.9 C) 98 F (36.7 C)  TempSrc: Oral Oral Oral Oral  SpO2:      Weight:      Height:       Body mass index is 32.92 kg/m.  Physical Exam Vitals reviewed.  Constitutional:      Appearance: Normal appearance. He is not ill-appearing.     Comments: Resting in bed with family around.   HENT:     Head: Normocephalic.     Mouth/Throat:     Mouth: Mucous membranes are moist.     Pharynx: Oropharynx is clear.  Eyes:     General: No scleral  icterus. Cardiovascular:     Rate and Rhythm: Normal rate and regular rhythm.  Pulmonary:     Effort: Pulmonary effort is normal.  Musculoskeletal:        General: Normal range of motion.     Cervical back: Normal range of motion.  Skin:    Coloration: Skin is not jaundiced or pale.  Neurological:     Mental Status: He is alert and oriented to person, place, and time.  Psychiatric:        Mood and Affect: Mood normal.        Judgment: Judgment normal.     Lab Results Lab Results  Component Value Date   WBC 9.8 03/24/2022   HGB 8.4 (L) 03/24/2022   HCT 24.8 (L) 03/24/2022   MCV 84.1 03/24/2022   PLT 301 03/24/2022    Lab Results  Component Value Date   CREATININE 0.87 03/24/2022   BUN 8 03/24/2022   NA  133 (L) 03/24/2022   K 3.7 03/24/2022   CL 101 03/24/2022   CO2 24 03/24/2022    Lab Results  Component Value Date   ALT 29 03/23/2022   AST 34 03/23/2022   ALKPHOS 146 (H) 03/23/2022   BILITOT 0.4 03/23/2022     Microbiology: Recent Results (from the past 240 hour(s))  Blood culture (routine single)     Status: None   Collection Time: 03/16/22 10:24 AM   Specimen: Blood  Result Value Ref Range Status   MICRO NUMBER: [id number]  Final   SPECIMEN QUALITY: Suboptimal  Final   Source BLOOD 1  Final   STATUS: FINAL  Final   Result:   Final    No growth after 5 days Inspection of blood culture bottles indicates that an inadequate volume of blood may have been collected for the detection of sepsis.   COMMENT: Aerobic and anaerobic bottle received.  Final  Blood culture (routine single)     Status: None   Collection Time: 03/16/22 10:24 AM   Specimen: Blood  Result Value Ref Range Status   MICRO NUMBER: [id number]  Final   SPECIMEN QUALITY: Adequate  Final   Source BLOOD 2  Final   STATUS: FINAL  Final   Result: No growth after 5 days  Final   COMMENT: Aerobic and anaerobic bottle received.  Final  Urine Culture     Status: None   Collection Time: 03/19/22  2:15 PM   Specimen: Urine   UR  Result Value Ref Range Status   Urine Culture, Routine Final report  Final   Organism ID, Bacteria Comment  Final    Comment: Mixed urogenital flora 25,000-50,000 colony forming units per mL   Urine Culture     Status: Abnormal   Collection Time: 03/19/22  4:04 PM   Specimen: In/Out Cath Urine  Result Value Ref Range Status   Specimen Description IN/OUT CATH URINE  Final   Special Requests   Final    NONE Performed at [facility], [address] [place], [place] [address]    Culture >=100,000 COLONIES/mL YEAST (A)  Final   Report Status 03/22/2022 FINAL  Final  Blood Culture (routine x 2)     Status: Abnormal   Collection Time: 03/19/22  4:13 PM   Specimen: BLOOD LEFT FOREARM   Result Value Ref Range Status   Specimen Description BLOOD LEFT FOREARM  Final   Special Requests   Final    BOTTLES DRAWN AEROBIC AND ANAEROBIC Blood Culture  adequate volume   Culture  Setup Time   Final    GRAM NEGATIVE RODS ANAEROBIC BOTTLE ONLY CRITICAL RESULT CALLED TO, READ BACK BY AND VERIFIED WITH: Lennie Hummer [id number]  FH Performed at [facility], [address] [place], [place] [address]    Culture (A)  Final    ESCHERICHIA COLI Confirmed Extended Spectrum Beta-Lactamase Producer (ESBL).  In bloodstream infections from ESBL organisms, carbapenems are preferred over piperacillin/tazobactam. They are shown to have a lower risk of mortality.    Report Status 03/22/2022 FINAL  Final   Organism ID, Bacteria ESCHERICHIA COLI  Final      Susceptibility   Escherichia coli - MIC*    AMPICILLIN >=32 RESISTANT Resistant     CEFAZOLIN >=64 RESISTANT Resistant     CEFEPIME 1 SENSITIVE Sensitive     CEFTAZIDIME RESISTANT Resistant     CEFTRIAXONE 32 RESISTANT Resistant     CIPROFLOXACIN >=4 RESISTANT Resistant     GENTAMICIN <=1 SENSITIVE Sensitive     IMIPENEM <=0.25 SENSITIVE Sensitive     TRIMETH/SULFA >=320 RESISTANT Resistant     AMPICILLIN/SULBACTAM >=32 RESISTANT Resistant     PIP/TAZO 64 INTERMEDIATE Intermediate     * ESCHERICHIA COLI  Blood Culture ID Panel (Reflexed)     Status: Abnormal   Collection Time: 03/19/22  4:13 PM  Result Value Ref Range Status   Enterococcus faecalis NOT DETECTED NOT DETECTED Final   Enterococcus Faecium NOT DETECTED NOT DETECTED Final   Listeria monocytogenes NOT DETECTED NOT DETECTED Final   Staphylococcus species NOT DETECTED NOT DETECTED Final   Staphylococcus aureus (BCID) NOT DETECTED NOT DETECTED Final   Staphylococcus epidermidis NOT DETECTED NOT DETECTED Final   Staphylococcus lugdunensis NOT DETECTED NOT DETECTED Final   Streptococcus species NOT DETECTED NOT DETECTED Final   Streptococcus agalactiae NOT DETECTED  NOT DETECTED Final   Streptococcus pneumoniae NOT DETECTED NOT DETECTED Final   Streptococcus pyogenes NOT DETECTED NOT DETECTED Final   A.calcoaceticus-baumannii NOT DETECTED NOT DETECTED Final   Bacteroides fragilis NOT DETECTED NOT DETECTED Final   Enterobacterales DETECTED (A) NOT DETECTED Final    Comment: Enterobacterales represent a large order of gram negative bacteria, not a single organism. CRITICAL RESULT CALLED TO, READ BACK BY AND VERIFIED WITH: PHARMD E. Daphine Deutscher [id number]  FH    Enterobacter cloacae complex NOT DETECTED NOT DETECTED Final   Escherichia coli DETECTED (A) NOT DETECTED Final    Comment: CRITICAL RESULT CALLED TO, READ BACK BY AND VERIFIED WITH: PHARMD EDaphine Deutscher [id number]  FH    Klebsiella aerogenes NOT DETECTED NOT DETECTED Final   Klebsiella oxytoca NOT DETECTED NOT DETECTED Final   Klebsiella pneumoniae NOT DETECTED NOT DETECTED Final   Proteus species NOT DETECTED NOT DETECTED Final   Salmonella species NOT DETECTED NOT DETECTED Final   Serratia marcescens NOT DETECTED NOT DETECTED Final   Haemophilus influenzae NOT DETECTED NOT DETECTED Final   Neisseria meningitidis NOT DETECTED NOT DETECTED Final   Pseudomonas aeruginosa NOT DETECTED NOT DETECTED Final   Stenotrophomonas maltophilia NOT DETECTED NOT DETECTED Final   Candida albicans NOT DETECTED NOT DETECTED Final   Candida auris NOT DETECTED NOT DETECTED Final   Candida glabrata NOT DETECTED NOT DETECTED Final   Candida krusei NOT DETECTED NOT DETECTED Final   Candida parapsilosis NOT DETECTED NOT DETECTED Final   Candida tropicalis NOT DETECTED NOT DETECTED Final   Cryptococcus neoformans/gattii NOT DETECTED NOT DETECTED Final   CTX-M ESBL DETECTED (A) NOT DETECTED Final  Comment: CRITICAL RESULT CALLED TO, READ BACK BY AND VERIFIED WITH: PHARMD EDaphine Deutscher [id number] @0815  FH (NOTE) Extended spectrum beta-lactamase detected. Recommend a carbapenem as initial therapy.      Carbapenem  resistance IMP NOT DETECTED NOT DETECTED Final   Carbapenem resistance KPC NOT DETECTED NOT DETECTED Final   Carbapenem resistance NDM NOT DETECTED NOT DETECTED Final   Carbapenem resist OXA 48 LIKE NOT DETECTED NOT DETECTED Final   Carbapenem resistance VIM NOT DETECTED NOT DETECTED Final    Comment: Performed at [facility], [address] [place], [place]  SARS Coronavirus 2 by RT PCR (hospital order, performed in [facility] hospital lab) *cepheid single result test* Anterior Nasal Swab     Status: None   Collection Time: 03/19/22  4:28 PM   Specimen: Anterior Nasal Swab  Result Value Ref Range Status   SARS Coronavirus 2 by RT PCR NEGATIVE NEGATIVE Final    Comment: (NOTE) SARS-CoV-2 target nucleic acids are NOT DETECTED.  The SARS-CoV-2 RNA is generally detectable in upper and lower respiratory specimens during the acute phase of infection. The lowest concentration of SARS-CoV-2 viral copies this assay can detect is 250 copies / mL. A negative result does not preclude SARS-CoV-2 infection and should not be used as the sole basis for treatment or other patient management decisions.  A negative result may occur with improper specimen collection / handling, submission of specimen other than nasopharyngeal swab, presence of viral mutation(s) within the areas targeted by this assay, and inadequate number of viral copies (<250 copies / mL). A negative result must be combined with clinical observations, patient history, and epidemiological information.  Fact Sheet for Patients:   05/19/22  Fact Sheet for Healthcare Providers: RoadLapTop.co.za  This test is not yet approved or  cleared by the http://kim-miller.com/ FDA and has been authorized for detection and/or diagnosis of SARS-CoV-2 by FDA under an Emergency Use Authorization (EUA).  This EUA will remain in effect (meaning this test can be used) for the duration  of the COVID-19 declaration under Section 564(b)(1) of the Act, 21 U.S.C. section 360bbb-3(b)(1), unless the authorization is terminated or revoked sooner.  Performed at [facility], [address], [place], [place]   Blood Culture (routine x 2)     Status: Abnormal   Collection Time: 03/19/22  5:59 PM   Specimen: BLOOD  Result Value Ref Range Status   Specimen Description BLOOD SITE NOT SPECIFIED  Final   Special Requests   Final    BOTTLES DRAWN AEROBIC AND ANAEROBIC Blood Culture adequate volume   Culture  Setup Time   Final    GRAM NEGATIVE RODS IN BOTH AEROBIC AND ANAEROBIC BOTTLES CRITICAL VALUE NOTED.  VALUE IS CONSISTENT WITH PREVIOUSLY REPORTED AND CALLED VALUE. CRITICAL RESULT CALLED TO, READ BACK BY AND VERIFIED WITH: PHARMD E. 05/19/22 Daphine Deutscher @0815  FH    Culture (A)  Final    ESCHERICHIA COLI SUSCEPTIBILITIES PERFORMED ON PREVIOUS CULTURE WITHIN THE LAST 5 DAYS. Performed at [facility], [address] [place], [address] [place]    Report Status 03/22/2022 FINAL  Final  Culture, blood (Routine X 2) w Reflex to ID Panel     Status: None (Preliminary result)   Collection Time: 03/21/22 12:33 AM   Specimen: BLOOD LEFT ARM  Result Value Ref Range Status   Specimen Description BLOOD LEFT ARM  Final   Special Requests   Final    BOTTLES DRAWN AEROBIC  ONLY Blood Culture results may not be optimal due to an inadequate volume of blood received in culture bottles   Culture   Final    NO GROWTH 3 DAYS Performed at [facility], [address] [place], [place] [address]    Report Status PENDING  Incomplete    Rexene AlbertsStephanie Fed Ceci, MSN, NP-C [facility]  [email or web address] Pager: [phone] Office: [phone] [facility] Main Line: [phone] *Secure Chat Communication Welcome

## 2022-03-24 NOTE — Progress Notes (Addendum)
Spoke with patient's daughter and updated about MRI results, possible consideration for early spine infection with paraspinal myositis (though non-specific) and need to further investigate with TEE. Explained the test briefly and that it is currently scheduled Thursday morning.   Explained that he will go home with a picc line again just like before but these tests will help [place] better understand how long he needs. I told her to expect somewhere between 4-6 weeks with the information we are gathering.   Welcomed and answered any further questions she had.   Rexene Alberts, MSN, NP-C [facility]  [email or web address] Pager: [phone] Office: [phone] [facility] Main Line: [phone] *Secure Chat Communication Welcome

## 2022-03-24 NOTE — Progress Notes (Signed)
  Echocardiogram 2D Echocardiogram has been performed.  John Meza F 03/24/2022, 1:48 PM

## 2022-03-24 NOTE — Telephone Encounter (Signed)
I received an estimate from [facility]   of $63/month for 90 catheters.  These were the least expensive catheters.    I spoke to the patient's wife today with the assistance of Spanish Interpreter # [id number]/[employer]  and she explained that they are not able to afford the cost of $63/month, they could not even afford $30/month.  They currently have no income and their daughter provides financial assistance.  He has been approved for the [facility] at 100% but that does not cover medical supplies.   I have sent a request to [facility] for financial assistance to help pay for  the cost of the catheters. It is in the patient's best interest that he does not continue to reuse his catheters as he has been doing.

## 2022-03-25 ENCOUNTER — Inpatient Hospital Stay: Payer: Self-pay

## 2022-03-25 ENCOUNTER — Ambulatory Visit: Payer: No Typology Code available for payment source | Admitting: Nurse Practitioner

## 2022-03-25 DIAGNOSIS — I10 Essential (primary) hypertension: Secondary | ICD-10-CM

## 2022-03-25 DIAGNOSIS — E119 Type 2 diabetes mellitus without complications: Secondary | ICD-10-CM

## 2022-03-25 DIAGNOSIS — A4151 Sepsis due to Escherichia coli [E. coli]: Secondary | ICD-10-CM

## 2022-03-25 DIAGNOSIS — Z794 Long term (current) use of insulin: Secondary | ICD-10-CM

## 2022-03-25 LAB — CBC WITH DIFFERENTIAL/PLATELET
Abs Immature Granulocytes: 0.03 10*3/uL (ref 0.00–0.07)
Basophils Absolute: 0 10*3/uL (ref 0.0–0.1)
Basophils Relative: 0 %
Eosinophils Absolute: 0.1 10*3/uL (ref 0.0–0.5)
Eosinophils Relative: 1 %
HCT: 22.9 % — ABNORMAL LOW (ref 39.0–52.0)
Hemoglobin: 7.6 g/dL — ABNORMAL LOW (ref 13.0–17.0)
Immature Granulocytes: 0 %
Lymphocytes Relative: 19 %
Lymphs Abs: 1.5 10*3/uL (ref 0.7–4.0)
MCH: 27.6 pg (ref 26.0–34.0)
MCHC: 33.2 g/dL (ref 30.0–36.0)
MCV: 83.3 fL (ref 80.0–100.0)
Monocytes Absolute: 0.7 10*3/uL (ref 0.1–1.0)
Monocytes Relative: 8 %
Neutro Abs: 5.6 10*3/uL (ref 1.7–7.7)
Neutrophils Relative %: 72 %
Platelets: 326 10*3/uL (ref 150–400)
RBC: 2.75 MIL/uL — ABNORMAL LOW (ref 4.22–5.81)
RDW: 13.8 % (ref 11.5–15.5)
WBC: 7.8 10*3/uL (ref 4.0–10.5)
nRBC: 0 % (ref 0.0–0.2)

## 2022-03-25 LAB — COMPREHENSIVE METABOLIC PANEL
ALT: 28 U/L (ref 0–44)
AST: 29 U/L (ref 15–41)
Albumin: 1.6 g/dL — ABNORMAL LOW (ref 3.5–5.0)
Alkaline Phosphatase: 137 U/L — ABNORMAL HIGH (ref 38–126)
Anion gap: 8 (ref 5–15)
BUN: 8 mg/dL (ref 6–20)
CO2: 24 mmol/L (ref 22–32)
Calcium: 8 mg/dL — ABNORMAL LOW (ref 8.9–10.3)
Chloride: 103 mmol/L (ref 98–111)
Creatinine, Ser: 0.74 mg/dL (ref 0.61–1.24)
GFR, Estimated: >60 mL/min (ref >60)
Glucose, Bld: 151 mg/dL — ABNORMAL HIGH (ref 70–99)
Potassium: 4 mmol/L (ref 3.5–5.1)
Sodium: 135 mmol/L (ref 135–145)
Total Bilirubin: 0.4 mg/dL (ref 0.3–1.2)
Total Protein: 5.5 g/dL — ABNORMAL LOW (ref 6.5–8.1)

## 2022-03-25 LAB — GLUCOSE, CAPILLARY
Glucose-Capillary: 134 mg/dL — ABNORMAL HIGH (ref 70–99)
Glucose-Capillary: 249 mg/dL — ABNORMAL HIGH (ref 70–99)
Glucose-Capillary: 172 mg/dL — ABNORMAL HIGH (ref 70–99)
Glucose-Capillary: 142 mg/dL — ABNORMAL HIGH (ref 70–99)

## 2022-03-25 NOTE — Progress Notes (Signed)
    [facility] has been requested to perform a transesophageal echocardiogram on 6/15 for bacteremia.  After careful review of history and examination, the risks and benefits of transesophageal echocardiogram have been explained including risks of esophageal damage, perforation (1:10,000 risk), bleeding, pharyngeal hematoma as well as other potential complications associated with conscious sedation including aspiration, arrhythmia, respiratory failure and death. Alternatives to treatment were discussed, questions were answered. Patient is willing to proceed.   Interpretor #[id number] used for patient and family at the bedside.   Laverda Page, NP-C 03/25/2022 4:44 PM

## 2022-03-25 NOTE — Progress Notes (Signed)
PROGRESS NOTE    Braxston Bellew  IOE:[id number] DOB: [birth year] DOA: 03/19/2022 PCP: Storm Frisk, MD    Chief Complaint  Patient presents with   Code Sepsis    Brief Narrative:   Ayotunde Kasparek is a 61 y.o. male with history of diabetes mellitus type 2, hypertension, hyperlipidemia, chronic anemia, paraplegia with neurogenic bladder uses self cath was recently admitted for ESBL E. coli bacteremia was recently on IV antibiotics.  He presents to ER  hypotensive with systolic blood pressure in the 80s with fever leukocytosis concerning for sepsis.  -His work-up significant for recurrent bacteremia, thought to be secondary to urinary source, as well MRI spine significant for myositis, TEE is pending.   Assessment & Plan:   Active Problems:   HLD (hyperlipidemia)   Paraplegia (HCC)   Neurogenic bladder   Insulin dependent type 2 diabetes mellitus, controlled (HCC)   Essential hypertension   Lower urinary tract infectious disease   Sepsis (HCC)  Severe sepsis secondary to recurrent ESBL bacteremia . MRI of spine showing paraspinal myositis,(no discitis or epidural abscess). -ID input greatly appreciated, for now continue with IV meropenem. -2D echo with no evidence of vegetation, TEE pending for tomorrow - previous MD D/W Dr. Benancio Deeds 6/9 --recommends treatment of urinary infection nonemergent cystoscopy in office and outpatient follow-up with Dr. 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 - Routine management and follow-up   Incidental finding of right  hip fracture on imaging upon admission -Input greatly appreciated, not a surgical candidate given he is paraplegic and wheelchair dependent at baseline and insensate and right hip  Diabetes mellitus, type II -BGs are controlled on current dose of Semglee and insulin sliding scale -Metformin remains on hold -back on Sitagliptin     DVT prophylaxis: Lovenox Code Status: (Full Family Communication: Wife updated at bedside, teleinterpreter was used  Disposition:   Status is: Inpatient    Consultants:  Orth ID   Subjective:  He denies any complaints, afebrile over last 24 hours  Objective: Vitals:   03/24/22 2000 03/25/22 0000 03/25/22 0400 03/25/22 0818  BP: (!) 141/81 127/62 112/73 124/66  Pulse:    82  Resp: 14 (!) 23 (!) 23 11  Temp: 98.5 F (36.9 C) 98.5 F (36.9 C) 98.5 F (36.9 C) 98.2 F (36.8 C)  TempSrc: Oral Oral Oral Oral  SpO2:      Weight:      Height:        Intake/Output Summary (Last 24 hours) at 03/25/2022 0954 Last data filed at 03/25/2022 0600 Gross per 24 hour  Intake 100 ml  Output 2100 ml  Net -2000 ml   Filed Weights   03/20/22 0700  Weight: 87 kg    Examination:  Awake Alert, Oriented X 3, No new F.N deficits, Normal affect, paraplegic Symmetrical Chest wall movement, Good air movement bilaterally, CTAB RRR,No Gallops,Rubs or new Murmurs, No Parasternal Heave +ve B.Sounds, Abd Soft, No tenderness, No rebound - guarding or rigidity. No Cyanosis, Clubbing or edema, No new Rash or bruise       Data Reviewed:  I have personally reviewed following labs and imaging studies  CBC: Recent Labs  Lab 03/19/22 1612 03/20/22 0340 03/23/22 0828 03/24/22 0147 03/25/22 0008  WBC 18.3* 20.2* 7.5 9.8 7.8  NEUTROABS 17.3*  --  5.3  --  5.6  HGB 9.5* 7.8* 7.3* 8.4* 7.6*  HCT 29.3* 24.0* 22.5* 24.8* 22.9*  MCV 85.2 86.6 84.6 84.1 83.3  PLT 428* 361 279 301 [id number]    Basic Metabolic Panel: Recent Labs  Lab 03/19/22 1612 03/20/22 0340  03/23/22 0828 03/24/22 0147 03/25/22 0008  NA 132* 132* 136 133* 135  K 4.3 3.8 3.7 3.7 4.0  CL 100 105 107 101 103  CO2 18* 20* 23 24 24   GLUCOSE 168* 126* 136* 151* 151*  BUN 21* 17 6 8 8   CREATININE 1.52* 1.27* 0.73 0.87 0.74  CALCIUM 8.4* 7.7* 8.2* 8.3* 8.0*    GFR: Estimated Creatinine Clearance: 100.1 mL/min (by C-G formula based on SCr of 0.74 mg/dL).  Liver Function Tests: Recent Labs  Lab 03/19/22 1612 03/20/22 0340 03/23/22 0828 03/25/22 0008  AST 24 23 34 29  ALT 30 25 29 28   ALKPHOS 181* 169* 146* 137*  BILITOT 1.0 0.4 0.4 0.4  PROT 7.2 5.7* 5.4* 5.5*  ALBUMIN 2.2* 1.8* 1.6* 1.6*    CBG: Recent Labs  Lab 03/24/22 0909 03/24/22 1205 03/24/22 1757 03/24/22 2135 03/25/22 0820  GLUCAP 141* 216* 155* 164* 134*     Recent Results (from the past 240 hour(s))  Blood culture (routine single)     Status: None   Collection Time: 03/16/22 10:24 AM   Specimen: Blood  Result Value Ref Range Status   MICRO NUMBER: [id number]  Final   SPECIMEN QUALITY: Suboptimal  Final   Source BLOOD 1  Final   STATUS: FINAL  Final   Result:   Final    No growth after 5 days Inspection of blood culture bottles indicates that an inadequate volume of blood may have been collected for the detection of sepsis.   COMMENT: Aerobic and anaerobic bottle received.  Final  Blood culture (routine single)     Status: None   Collection Time: 03/16/22 10:24 AM   Specimen: Blood  Result Value Ref Range Status   MICRO NUMBER: [id number]  Final   SPECIMEN QUALITY: Adequate  Final   Source BLOOD 2  Final   STATUS: FINAL  Final   Result: No growth after 5 days  Final   COMMENT: Aerobic and anaerobic bottle received.  Final  Urine Culture     Status: None   Collection Time: 03/19/22  2:15 PM   Specimen: Urine   UR  Result Value Ref Range Status   Urine Culture, Routine Final report  Final   Organism ID, Bacteria Comment  Final    Comment: Mixed urogenital flora 25,000-50,000 colony  forming units per mL   Urine Culture     Status: Abnormal   Collection Time: 03/19/22  4:04 PM   Specimen: In/Out Cath Urine  Result Value Ref Range Status   Specimen Description IN/OUT CATH URINE  Final   Special Requests   Final    NONE Performed at [facility], [place] [address] [place], [place] [address]    Culture >=100,000 COLONIES/mL YEAST (A)  Final   Report Status 03/22/2022 FINAL  Final  Blood Culture (routine x 2)     Status: Abnormal   Collection Time: 03/19/22  4:13 PM   Specimen: BLOOD LEFT FOREARM  Result Value  Ref Range Status   Specimen Description BLOOD LEFT FOREARM  Final   Special Requests   Final    BOTTLES DRAWN AEROBIC AND ANAEROBIC Blood Culture adequate volume   Culture  Setup Time   Final    GRAM NEGATIVE RODS ANAEROBIC BOTTLE ONLY CRITICAL RESULT CALLED TO, READ BACK BY AND VERIFIED WITH: Cathlyn Parsons [id number] @0813  FH Performed at [facility], [place] [address], [place], [place] [address]    Culture (A)  Final    ESCHERICHIA COLI Confirmed Extended Spectrum Beta-Lactamase Producer (ESBL).  In bloodstream infections from ESBL organisms, carbapenems are preferred over piperacillin/tazobactam. They are shown to have a lower risk of mortality.    Report Status 03/22/2022 FINAL  Final   Organism ID, Bacteria ESCHERICHIA COLI  Final      Susceptibility   Escherichia coli - MIC*    AMPICILLIN >=32 RESISTANT Resistant     CEFAZOLIN >=64 RESISTANT Resistant     CEFEPIME 1 SENSITIVE Sensitive     CEFTAZIDIME RESISTANT Resistant     CEFTRIAXONE 32 RESISTANT Resistant     CIPROFLOXACIN >=4 RESISTANT Resistant     GENTAMICIN <=1 SENSITIVE Sensitive     IMIPENEM <=0.25 SENSITIVE Sensitive     TRIMETH/SULFA >=320 RESISTANT Resistant     AMPICILLIN/SULBACTAM >=32 RESISTANT Resistant     PIP/TAZO 64 INTERMEDIATE Intermediate     * ESCHERICHIA COLI  Blood Culture ID Panel (Reflexed)     Status: Abnormal   Collection Time: 03/19/22  4:13 PM  Result  Value Ref Range Status   Enterococcus faecalis NOT DETECTED NOT DETECTED Final   Enterococcus Faecium NOT DETECTED NOT DETECTED Final   Listeria monocytogenes NOT DETECTED NOT DETECTED Final   Staphylococcus species NOT DETECTED NOT DETECTED Final   Staphylococcus aureus (BCID) NOT DETECTED NOT DETECTED Final   Staphylococcus epidermidis NOT DETECTED NOT DETECTED Final   Staphylococcus lugdunensis NOT DETECTED NOT DETECTED Final   Streptococcus species NOT DETECTED NOT DETECTED Final   Streptococcus agalactiae NOT DETECTED NOT DETECTED Final   Streptococcus pneumoniae NOT DETECTED NOT DETECTED Final   Streptococcus pyogenes NOT DETECTED NOT DETECTED Final   A.calcoaceticus-baumannii NOT DETECTED NOT DETECTED Final   Bacteroides fragilis NOT DETECTED NOT DETECTED Final   Enterobacterales DETECTED (A) NOT DETECTED Final    Comment: Enterobacterales represent a large order of gram negative bacteria, not a single organism. CRITICAL RESULT CALLED TO, READ BACK BY AND VERIFIED WITH: PHARMD E. Hassell Done [id number] @0815  FH    Enterobacter cloacae complex NOT DETECTED NOT DETECTED Final   Escherichia coli DETECTED (A) NOT DETECTED Final    Comment: CRITICAL RESULT CALLED TO, READ BACK BY AND VERIFIED WITH: PHARMD ETiburcio Bash @0815  FH    Klebsiella aerogenes NOT DETECTED NOT DETECTED Final   Klebsiella oxytoca NOT DETECTED NOT DETECTED Final   Klebsiella pneumoniae NOT DETECTED NOT DETECTED Final   Proteus species NOT DETECTED NOT DETECTED Final   Salmonella species NOT DETECTED NOT DETECTED Final   Serratia marcescens NOT DETECTED NOT DETECTED Final   Haemophilus influenzae NOT DETECTED NOT DETECTED Final   Neisseria meningitidis NOT DETECTED NOT DETECTED Final   Pseudomonas aeruginosa NOT DETECTED NOT DETECTED Final   Stenotrophomonas maltophilia NOT DETECTED NOT DETECTED Final   Candida albicans NOT DETECTED NOT DETECTED Final   Candida auris NOT DETECTED NOT DETECTED Final   Candida  glabrata NOT DETECTED NOT DETECTED Final   Candida krusei NOT DETECTED NOT DETECTED Final   Candida parapsilosis NOT DETECTED  NOT DETECTED Final   Candida tropicalis NOT DETECTED NOT DETECTED Final   Cryptococcus neoformans/gattii NOT DETECTED NOT DETECTED Final   CTX-M ESBL DETECTED (A) NOT DETECTED Final    Comment: CRITICAL RESULT CALLED TO, READ BACK BY AND VERIFIED WITH: PHARMD EHassell Done [id number] @0815  FH (NOTE) Extended spectrum beta-lactamase detected. Recommend a carbapenem as initial therapy.      Carbapenem resistance IMP NOT DETECTED NOT DETECTED Final   Carbapenem resistance KPC NOT DETECTED NOT DETECTED Final   Carbapenem resistance NDM NOT DETECTED NOT DETECTED Final   Carbapenem resist OXA 48 LIKE NOT DETECTED NOT DETECTED Final   Carbapenem resistance VIM NOT DETECTED NOT DETECTED Final    Comment: Performed at [facility], [place] [address], [place], [place] [address]  SARS Coronavirus 2 by RT PCR (hospital order, performed in [facility] hospital lab) *cepheid single result test* Anterior Nasal Swab     Status: None   Collection Time: 03/19/22  4:28 PM   Specimen: Anterior Nasal Swab  Result Value Ref Range Status   SARS Coronavirus 2 by RT PCR NEGATIVE NEGATIVE Final    Comment: (NOTE) SARS-CoV-2 target nucleic acids are NOT DETECTED.  The SARS-CoV-2 RNA is generally detectable in upper and lower respiratory specimens during the acute phase of infection. The lowest concentration of SARS-CoV-2 viral copies this assay can detect is 250 copies / mL. A negative result does not preclude SARS-CoV-2 infection and should not be used as the sole basis for treatment or other patient management decisions.  A negative result may occur with improper specimen collection / handling, submission of specimen other than nasopharyngeal swab, presence of viral mutation(s) within the areas targeted by this assay, and inadequate number of viral copies (<250 copies / mL). A negative  result must be combined with clinical observations, patient history, and epidemiological information.  Fact Sheet for Patients:   https://www.patel.info/  Fact Sheet for Healthcare Providers: https://hall.com/  This test is not yet approved or  cleared by the [place] FDA and has been authorized for detection and/or diagnosis of SARS-CoV-2 by FDA under an Emergency Use Authorization (EUA).  This EUA will remain in effect (meaning this test can be used) for the duration of the COVID-19 declaration under Section 564(b)(1) of the Act, 21 U.S.C. section 360bbb-3(b)(1), unless the authorization is terminated or revoked sooner.  Performed at [facility], [place] [address], [place], [place] [address]   Blood Culture (routine x 2)     Status: Abnormal   Collection Time: 03/19/22  5:59 PM   Specimen: BLOOD  Result Value Ref Range Status   Specimen Description BLOOD SITE NOT SPECIFIED  Final   Special Requests   Final    BOTTLES DRAWN AEROBIC AND ANAEROBIC Blood Culture adequate volume   Culture  Setup Time   Final    GRAM NEGATIVE RODS IN BOTH AEROBIC AND ANAEROBIC BOTTLES CRITICAL VALUE NOTED.  VALUE IS CONSISTENT WITH PREVIOUSLY REPORTED AND CALLED VALUE. CRITICAL RESULT CALLED TO, READ BACK BY AND VERIFIED WITH: PHARMD E. Hassell Done [id number] @0815  FH    Culture (A)  Final    ESCHERICHIA COLI SUSCEPTIBILITIES PERFORMED ON PREVIOUS CULTURE WITHIN THE LAST 5 DAYS. Performed at [facility], [place] [address], [place], [place] [address]    Report Status 03/22/2022 FINAL  Final  Culture, blood (Routine X 2) w Reflex to ID Panel     Status: None (Preliminary result)   Collection Time: 03/21/22 12:33 AM  Specimen: BLOOD LEFT ARM  Result Value Ref Range Status   Specimen Description BLOOD LEFT ARM  Final   Special Requests   Final    BOTTLES DRAWN AEROBIC ONLY Blood Culture results may not be optimal due to an inadequate volume of blood  received in culture bottles   Culture   Final    NO GROWTH 4 DAYS Performed at [facility], [place] [address] [place], [place] [address]    Report Status PENDING  Incomplete         Radiology Studies: ECHOCARDIOGRAM COMPLETE  Result Date: 03/24/2022    ECHOCARDIOGRAM REPORT   Patient Name:   ABBIE RUSCHAK Date of Exam: 03/24/2022 Medical Rec #:  [id number]                  Height:       64.0 in Accession #:    [id number]                 Weight:       191.8 lb Date of Birth:  [birth year]                  BSA:          1.922 m Patient Age:    8 years                   BP:           127/71 mmHg Patient Gender: M                          HR:           89 bpm. Exam Location:  Inpatient Procedure: 2D Echo, Color Doppler and Cardiac Doppler Indications:     Endocardititis  History:         Patient has no prior history of Echocardiogram examinations.  Sonographer:     Merrie Roof RDCS Referring Phys:  [place] Diagnosing Phys: Oswaldo Milian MD IMPRESSIONS  1. Left ventricular ejection fraction, by estimation, is 60 to 65%. The left ventricle has normal function. The left ventricle has no regional wall motion abnormalities. Left ventricular diastolic parameters were normal.  2. Right ventricular systolic function is normal. The right ventricular size is normal. Tricuspid regurgitation signal is inadequate for assessing PA pressure.  3. The mitral valve is grossly normal. No evidence of mitral valve regurgitation. No evidence of mitral stenosis.  4. The aortic valve was not well visualized. Aortic valve regurgitation is not visualized. No aortic stenosis is present.  5. The inferior vena cava is normal in size with greater than 50% respiratory variability, suggesting right atrial pressure of 3 mmHg. Conclusion(s)/Recommendation(s): No evidence of valvular vegetations on this transthoracic echocardiogram, though technically difficult study with poor visualization of valves.  Consider a transesophageal echocardiogram to exclude infective endocarditis if clinically indicated. FINDINGS  Left Ventricle: Left ventricular ejection fraction, by estimation, is 60 to 65%. The left ventricle has normal function. The left ventricle has no regional wall motion abnormalities. The left ventricular internal cavity size was normal in size. There is  no left ventricular hypertrophy. Left ventricular diastolic parameters were normal. Right Ventricle: The right ventricular size is normal. No increase in right ventricular wall thickness. Right ventricular systolic function is normal. Tricuspid regurgitation signal is inadequate for assessing PA pressure. Left Atrium: Left atrial size was normal in size. Right Atrium: Right atrial size was normal in  size. Pericardium: There is no evidence of pericardial effusion. Mitral Valve: The mitral valve is grossly normal. No evidence of mitral valve regurgitation. No evidence of mitral valve stenosis. Tricuspid Valve: The tricuspid valve is grossly normal. Tricuspid valve regurgitation is not demonstrated. Aortic Valve: The aortic valve was not well visualized. Aortic valve regurgitation is not visualized. No aortic stenosis is present. Aortic valve mean gradient measures 3.0 mmHg. Aortic valve peak gradient measures 5.3 mmHg. Aortic valve area, by VTI measures 3.39 cm. Pulmonic Valve: The pulmonic valve was not well visualized. Pulmonic valve regurgitation is not visualized. Aorta: The aortic root is normal in size and structure. Venous: The inferior vena cava is normal in size with greater than 50% respiratory variability, suggesting right atrial pressure of 3 mmHg. IAS/Shunts: The interatrial septum was not well visualized.  LEFT VENTRICLE PLAX 2D LVIDd:         4.70 cm   Diastology LVIDs:         2.80 cm   LV e' medial:    8.38 cm/s LV PW:         0.80 cm   LV E/e' medial:  8.6 LV IVS:        1.00 cm   LV e' lateral:   11.20 cm/s LVOT diam:     2.00 cm   LV E/e'  lateral: 6.4 LV SV:         77 LV SV Index:   40 LVOT Area:     3.14 cm  RIGHT VENTRICLE RV Mid diam:    3.00 cm LEFT ATRIUM             Index        RIGHT ATRIUM           Index LA diam:        3.20 cm 1.67 cm/m   RA Area:     13.10 cm LA Vol (A2C):   46.8 ml 24.35 ml/m  RA Volume:   23.70 ml  12.33 ml/m LA Vol (A4C):   55.9 ml 29.09 ml/m LA Biplane Vol: 53.1 ml 27.63 ml/m  AORTIC VALVE AV Area (Vmax):    2.51 cm AV Area (Vmean):   2.48 cm AV Area (VTI):     3.39 cm AV Vmax:           115.00 cm/s AV Vmean:          87.300 cm/s AV VTI:            0.228 m AV Peak Grad:      5.3 mmHg AV Mean Grad:      3.0 mmHg LVOT Vmax:         92.00 cm/s LVOT Vmean:        68.800 cm/s LVOT VTI:          0.246 m LVOT/AV VTI ratio: 1.08  AORTA Ao Root diam: 3.80 cm MITRAL VALVE MV Area (PHT): 3.34 cm    SHUNTS MV Decel Time: 227 msec    Systemic VTI:  0.25 m MV E velocity: 72.00 cm/s  Systemic Diam: 2.00 cm MV A velocity: 74.60 cm/s MV E/A ratio:  0.97 Epifanio Lesches MD Electronically signed by Epifanio Lesches MD Signature Date/Time: 03/24/2022/2:40:25 PM    Final (Updated)    MR Lumbar Spine W Wo Contrast  Result Date: 03/24/2022 CLINICAL DATA:  Initial evaluation for lower back pain, bacteremia. EXAM: MRI LUMBAR SPINE WITHOUT AND WITH CONTRAST TECHNIQUE: Multiplanar and multiecho pulse sequences of the  lumbar spine were obtained without and with intravenous contrast. CONTRAST:  32mL GADAVIST GADOBUTROL 1 MMOL/ML IV SOLN COMPARISON:  Prior study from 03/19/2022. FINDINGS: Segmentation: Standard. Lowest well-formed disc space labeled the L5-S1 level. Alignment: Physiologic with preservation of the normal lumbar lordosis. No listhesis. Vertebrae: Remotely healed T11 fracture with mild height loss noted. Patient is status post posterior fusion extending from the partially visualized lower thoracic spine through L1. Vertebral body height otherwise maintained with no other acute or chronic fracture. Bone marrow  signal intensity within normal limits. No discrete or worrisome osseous lesions. Minimal reactive endplate change about the L3-4 interspace posteriorly, favored to be degenerative. No other evidence for acute osteomyelitis discitis or septic arthritis. Conus medullaris and cauda equina: Conus extends to the L1 level. Conus and cauda equina appear normal. No epidural collections or abnormal enhancement. Paraspinal and other soft tissues: There is apparent diffuse edema within the lower posterior paraspinous musculature (series 5, images 14, 1), nonspecific, but could reflect changes of acute myositis given history. No loculated collections. Prominent chronic fatty atrophy about the psoas musculature noted bilaterally. Subcentimeter simple cyst noted within the right kidney, benign in appearance, no follow-up imaging recommended. Visualized visceral structures otherwise unremarkable. Disc levels: T11-12: Unremarkable. T12-L1: Prior posterior fusion.  No canal or foraminal stenosis. L1-2: Mild reactive endplate spurring without significant disc bulge. Mild facet hypertrophy. No significant canal or foraminal stenosis. L2-3: Minimal reactive endplate spurring without significant disc bulge. Mild facet spurring. No canal or foraminal stenosis. L3-4: Mild disc bulge with disc desiccation. Associated mild reactive endplate spurring. Mild facet hypertrophy. No significant canal or foraminal stenosis. L4-5: Disc desiccation without significant disc bulge. Mild reactive endplate spurring. Mild facet hypertrophy. No canal or foraminal stenosis. L5-S1: Negative interspace. Mild to moderate facet hypertrophy. No canal or foraminal stenosis. IMPRESSION: 1. Diffuse edema within the lower posterior paraspinous musculature, nonspecific, but could reflect changes of acute myositis given provided history. No loculated or drainable fluid collections. 2. No other convincing evidence for acute infection within the lumbar spine. 3. Mild  for age degenerative spondylosis as above without significant canal or foraminal stenosis. 4. Chronic T11 fracture with prior posterior thoracolumbar fusion, partially visualized. Electronically Signed   By: Jeannine Boga M.D.   On: 03/24/2022 03:38        Scheduled Meds:  atorvastatin  20 mg Oral Q1200   enoxaparin (LOVENOX) injection  40 mg Subcutaneous Q24H   ferrous sulfate  325 mg Oral Daily   insulin aspart  0-9 Units Subcutaneous TID WC   insulin glargine-yfgn  15 Units Subcutaneous QHS   irbesartan  75 mg Oral Daily   linagliptin  5 mg Oral Daily   Continuous Infusions:  meropenem (MERREM) IV 1 g (03/25/22 0915)     LOS: 6 days      Phillips Climes, MD [facility]   To contact the attending provider between 7A-7P or the covering provider during after hours 7P-7A, please log into the web site [email or web address] and access using universal [place] password for that web site. If you do not have the password, please call the hospital operator.  03/25/2022, 9:54 AM

## 2022-03-25 NOTE — Plan of Care (Signed)
  Problem: Education: Goal: Ability to describe self-care measures that may prevent or decrease complications (Diabetes Survival Skills Education) will improve Outcome: Progressing   Problem: Coping: Goal: Ability to adjust to condition or change in health will improve Outcome: Progressing   Problem: Fluid Volume: Goal: Ability to maintain a balanced intake and output will improve Outcome: Progressing   Problem: Metabolic: Goal: Ability to maintain appropriate glucose levels will improve Outcome: Progressing   Problem: Metabolic: Goal: Ability to maintain appropriate glucose levels will improve Outcome: Progressing   Problem: Skin Integrity: Goal: Risk for impaired skin integrity will decrease Outcome: Progressing   Problem: Education: Goal: Knowledge of General Education information will improve Description: Including pain rating scale, medication(s)/side effects and non-pharmacologic comfort measures Outcome: Progressing   Problem: Health Behavior/Discharge Planning: Goal: Ability to manage health-related needs will improve Outcome: Progressing

## 2022-03-25 NOTE — H&P (View-Only) (Signed)
PROGRESS NOTE    John Meza  KPV:[id number] DOB: [birth year] DOA: 03/19/2022 PCP: Storm Frisk, MD    Chief Complaint  Patient presents with   Code Sepsis    Brief Narrative:   Sheriff Rodenberg is a 61 y.o. male with history of diabetes mellitus type 2, hypertension, hyperlipidemia, chronic anemia, paraplegia with neurogenic bladder uses self cath was recently admitted for ESBL E. coli bacteremia was recently on IV antibiotics.  He presents to ER  hypotensive with systolic blood pressure in the 80s with fever leukocytosis concerning for sepsis.  -His work-up significant for recurrent bacteremia, thought to be secondary to urinary source, as well MRI spine significant for myositis, TEE is pending.   Assessment & Plan:   Active Problems:   HLD (hyperlipidemia)   Paraplegia (HCC)   Neurogenic bladder   Insulin dependent type 2 diabetes mellitus, controlled (HCC)   Essential hypertension   Lower urinary tract infectious disease   Sepsis (HCC)  Severe sepsis secondary to recurrent ESBL bacteremia . MRI of spine showing paraspinal myositis,(no discitis or epidural abscess). -ID input greatly appreciated, for now continue with IV meropenem. -2D echo with no evidence of vegetation, TEE pending for tomorrow - previous MD D/W Dr. Benancio Deeds 6/9 --recommends treatment of urinary infection nonemergent cystoscopy in office and outpatient follow-up with Dr. 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 - Routine management and follow-up   Incidental finding of right  hip fracture on imaging upon admission -Input greatly appreciated, not a surgical candidate given he is paraplegic and wheelchair dependent at baseline and insensate and right hip  Diabetes mellitus, type II -BGs are controlled on current dose of Semglee and insulin sliding scale -Metformin remains on hold -back on Sitagliptin     DVT prophylaxis: Lovenox Code Status: (Full Family Communication: Wife updated at bedside, teleinterpreter was used  Disposition:   Status is: Inpatient    Consultants:  Orth ID   Subjective:  He denies any complaints, afebrile over last 24 hours  Objective: Vitals:   03/24/22 2000 03/25/22 0000 03/25/22 0400 03/25/22 0818  BP: (!) 141/81 127/62 112/73 124/66  Pulse:    82  Resp: 14 (!) 23 (!) 23 11  Temp: 98.5 F (36.9 C) 98.5 F (36.9 C) 98.5 F (36.9 C) 98.2 F (36.8 C)  TempSrc: Oral Oral Oral Oral  SpO2:      Weight:      Height:        Intake/Output Summary (Last 24 hours) at 03/25/2022 0954 Last data filed at 03/25/2022 0600 Gross per 24 hour  Intake 100 ml  Output 2100 ml  Net -2000 ml   Filed Weights   03/20/22 0700  Weight: 87 kg    Examination:  Awake Alert, Oriented X 3, No new F.N deficits, Normal affect, paraplegic Symmetrical Chest wall movement, Good air movement bilaterally, CTAB RRR,No Gallops,Rubs or new Murmurs, No Parasternal Heave +ve B.Sounds, Abd Soft, No tenderness, No rebound - guarding or rigidity. No Cyanosis, Clubbing or edema, No new Rash or bruise       Data Reviewed:  I have personally reviewed following labs and imaging studies  CBC: Recent Labs  Lab 03/19/22 1612 03/20/22 0340 03/23/22 0828 03/24/22 0147 03/25/22 0008  WBC 18.3* 20.2* 7.5 9.8 7.8  NEUTROABS 17.3*  --  5.3  --  5.6  HGB 9.5* 7.8* 7.3* 8.4* 7.6*  HCT 29.3* 24.0* 22.5* 24.8* 22.9*  MCV 85.2 86.6 84.6 84.1 83.3  PLT 428* 361 279 301 [id number]    Basic Metabolic Panel: Recent Labs  Lab 03/19/22 1612 03/20/22 0340  03/23/22 0828 03/24/22 0147 03/25/22 0008  NA 132* 132* 136 133* 135  K 4.3 3.8 3.7 3.7 4.0  CL 100 105 107 101 103  CO2 18* 20* 23 24 24   GLUCOSE 168* 126* 136* 151* 151*  BUN 21* 17 6 8 8   CREATININE 1.52* 1.27* 0.73 0.87 0.74  CALCIUM 8.4* 7.7* 8.2* 8.3* 8.0*    GFR: Estimated Creatinine Clearance: 100.1 mL/min (by C-G formula based on SCr of 0.74 mg/dL).  Liver Function Tests: Recent Labs  Lab 03/19/22 1612 03/20/22 0340 03/23/22 0828 03/25/22 0008  AST 24 23 34 29  ALT 30 25 29 28   ALKPHOS 181* 169* 146* 137*  BILITOT 1.0 0.4 0.4 0.4  PROT 7.2 5.7* 5.4* 5.5*  ALBUMIN 2.2* 1.8* 1.6* 1.6*    CBG: Recent Labs  Lab 03/24/22 0909 03/24/22 1205 03/24/22 1757 03/24/22 2135 03/25/22 0820  GLUCAP 141* 216* 155* 164* 134*     Recent Results (from the past 240 hour(s))  Blood culture (routine single)     Status: None   Collection Time: 03/16/22 10:24 AM   Specimen: Blood  Result Value Ref Range Status   MICRO NUMBER: [id number]  Final   SPECIMEN QUALITY: Suboptimal  Final   Source BLOOD 1  Final   STATUS: FINAL  Final   Result:   Final    No growth after 5 days Inspection of blood culture bottles indicates that an inadequate volume of blood may have been collected for the detection of sepsis.   COMMENT: Aerobic and anaerobic bottle received.  Final  Blood culture (routine single)     Status: None   Collection Time: 03/16/22 10:24 AM   Specimen: Blood  Result Value Ref Range Status   MICRO NUMBER: [id number]  Final   SPECIMEN QUALITY: Adequate  Final   Source BLOOD 2  Final   STATUS: FINAL  Final   Result: No growth after 5 days  Final   COMMENT: Aerobic and anaerobic bottle received.  Final  Urine Culture     Status: None   Collection Time: 03/19/22  2:15 PM   Specimen: Urine   UR  Result Value Ref Range Status   Urine Culture, Routine Final report  Final   Organism ID, Bacteria Comment  Final    Comment: Mixed urogenital flora 25,000-50,000 colony  forming units per mL   Urine Culture     Status: Abnormal   Collection Time: 03/19/22  4:04 PM   Specimen: In/Out Cath Urine  Result Value Ref Range Status   Specimen Description IN/OUT CATH URINE  Final   Special Requests   Final    NONE Performed at [facility], [place] [address] [place], [place] [address]    Culture >=100,000 COLONIES/mL YEAST (A)  Final   Report Status 03/22/2022 FINAL  Final  Blood Culture (routine x 2)     Status: Abnormal   Collection Time: 03/19/22  4:13 PM   Specimen: BLOOD LEFT FOREARM  Result Value  Ref Range Status   Specimen Description BLOOD LEFT FOREARM  Final   Special Requests   Final    BOTTLES DRAWN AEROBIC AND ANAEROBIC Blood Culture adequate volume   Culture  Setup Time   Final    GRAM NEGATIVE RODS ANAEROBIC BOTTLE ONLY CRITICAL RESULT CALLED TO, READ BACK BY AND VERIFIED WITH: Cathlyn Parsons [id number] @0813  FH Performed at [facility], [place] [address], [place], [place] [address]    Culture (A)  Final    ESCHERICHIA COLI Confirmed Extended Spectrum Beta-Lactamase Producer (ESBL).  In bloodstream infections from ESBL organisms, carbapenems are preferred over piperacillin/tazobactam. They are shown to have a lower risk of mortality.    Report Status 03/22/2022 FINAL  Final   Organism ID, Bacteria ESCHERICHIA COLI  Final      Susceptibility   Escherichia coli - MIC*    AMPICILLIN >=32 RESISTANT Resistant     CEFAZOLIN >=64 RESISTANT Resistant     CEFEPIME 1 SENSITIVE Sensitive     CEFTAZIDIME RESISTANT Resistant     CEFTRIAXONE 32 RESISTANT Resistant     CIPROFLOXACIN >=4 RESISTANT Resistant     GENTAMICIN <=1 SENSITIVE Sensitive     IMIPENEM <=0.25 SENSITIVE Sensitive     TRIMETH/SULFA >=320 RESISTANT Resistant     AMPICILLIN/SULBACTAM >=32 RESISTANT Resistant     PIP/TAZO 64 INTERMEDIATE Intermediate     * ESCHERICHIA COLI  Blood Culture ID Panel (Reflexed)     Status: Abnormal   Collection Time: 03/19/22  4:13 PM  Result  Value Ref Range Status   Enterococcus faecalis NOT DETECTED NOT DETECTED Final   Enterococcus Faecium NOT DETECTED NOT DETECTED Final   Listeria monocytogenes NOT DETECTED NOT DETECTED Final   Staphylococcus species NOT DETECTED NOT DETECTED Final   Staphylococcus aureus (BCID) NOT DETECTED NOT DETECTED Final   Staphylococcus epidermidis NOT DETECTED NOT DETECTED Final   Staphylococcus lugdunensis NOT DETECTED NOT DETECTED Final   Streptococcus species NOT DETECTED NOT DETECTED Final   Streptococcus agalactiae NOT DETECTED NOT DETECTED Final   Streptococcus pneumoniae NOT DETECTED NOT DETECTED Final   Streptococcus pyogenes NOT DETECTED NOT DETECTED Final   A.calcoaceticus-baumannii NOT DETECTED NOT DETECTED Final   Bacteroides fragilis NOT DETECTED NOT DETECTED Final   Enterobacterales DETECTED (A) NOT DETECTED Final    Comment: Enterobacterales represent a large order of gram negative bacteria, not a single organism. CRITICAL RESULT CALLED TO, READ BACK BY AND VERIFIED WITH: PHARMD E. Hassell Done [id number] @0815  FH    Enterobacter cloacae complex NOT DETECTED NOT DETECTED Final   Escherichia coli DETECTED (A) NOT DETECTED Final    Comment: CRITICAL RESULT CALLED TO, READ BACK BY AND VERIFIED WITH: PHARMD ETiburcio Bash @0815  FH    Klebsiella aerogenes NOT DETECTED NOT DETECTED Final   Klebsiella oxytoca NOT DETECTED NOT DETECTED Final   Klebsiella pneumoniae NOT DETECTED NOT DETECTED Final   Proteus species NOT DETECTED NOT DETECTED Final   Salmonella species NOT DETECTED NOT DETECTED Final   Serratia marcescens NOT DETECTED NOT DETECTED Final   Haemophilus influenzae NOT DETECTED NOT DETECTED Final   Neisseria meningitidis NOT DETECTED NOT DETECTED Final   Pseudomonas aeruginosa NOT DETECTED NOT DETECTED Final   Stenotrophomonas maltophilia NOT DETECTED NOT DETECTED Final   Candida albicans NOT DETECTED NOT DETECTED Final   Candida auris NOT DETECTED NOT DETECTED Final   Candida  glabrata NOT DETECTED NOT DETECTED Final   Candida krusei NOT DETECTED NOT DETECTED Final   Candida parapsilosis NOT DETECTED  NOT DETECTED Final   Candida tropicalis NOT DETECTED NOT DETECTED Final   Cryptococcus neoformans/gattii NOT DETECTED NOT DETECTED Final   CTX-M ESBL DETECTED (A) NOT DETECTED Final    Comment: CRITICAL RESULT CALLED TO, READ BACK BY AND VERIFIED WITH: PHARMD EHassell Done [id number] @0815  FH (NOTE) Extended spectrum beta-lactamase detected. Recommend a carbapenem as initial therapy.      Carbapenem resistance IMP NOT DETECTED NOT DETECTED Final   Carbapenem resistance KPC NOT DETECTED NOT DETECTED Final   Carbapenem resistance NDM NOT DETECTED NOT DETECTED Final   Carbapenem resist OXA 48 LIKE NOT DETECTED NOT DETECTED Final   Carbapenem resistance VIM NOT DETECTED NOT DETECTED Final    Comment: Performed at [facility], [place] [address], [place], [place] [address]  SARS Coronavirus 2 by RT PCR (hospital order, performed in [facility] hospital lab) *cepheid single result test* Anterior Nasal Swab     Status: None   Collection Time: 03/19/22  4:28 PM   Specimen: Anterior Nasal Swab  Result Value Ref Range Status   SARS Coronavirus 2 by RT PCR NEGATIVE NEGATIVE Final    Comment: (NOTE) SARS-CoV-2 target nucleic acids are NOT DETECTED.  The SARS-CoV-2 RNA is generally detectable in upper and lower respiratory specimens during the acute phase of infection. The lowest concentration of SARS-CoV-2 viral copies this assay can detect is 250 copies / mL. A negative result does not preclude SARS-CoV-2 infection and should not be used as the sole basis for treatment or other patient management decisions.  A negative result may occur with improper specimen collection / handling, submission of specimen other than nasopharyngeal swab, presence of viral mutation(s) within the areas targeted by this assay, and inadequate number of viral copies (<250 copies / mL). A negative  result must be combined with clinical observations, patient history, and epidemiological information.  Fact Sheet for Patients:   https://www.patel.info/  Fact Sheet for Healthcare Providers: https://hall.com/  This test is not yet approved or  cleared by the [place] FDA and has been authorized for detection and/or diagnosis of SARS-CoV-2 by FDA under an Emergency Use Authorization (EUA).  This EUA will remain in effect (meaning this test can be used) for the duration of the COVID-19 declaration under Section 564(b)(1) of the Act, 21 U.S.C. section 360bbb-3(b)(1), unless the authorization is terminated or revoked sooner.  Performed at [facility], [place] [address], [place], [place] [address]   Blood Culture (routine x 2)     Status: Abnormal   Collection Time: 03/19/22  5:59 PM   Specimen: BLOOD  Result Value Ref Range Status   Specimen Description BLOOD SITE NOT SPECIFIED  Final   Special Requests   Final    BOTTLES DRAWN AEROBIC AND ANAEROBIC Blood Culture adequate volume   Culture  Setup Time   Final    GRAM NEGATIVE RODS IN BOTH AEROBIC AND ANAEROBIC BOTTLES CRITICAL VALUE NOTED.  VALUE IS CONSISTENT WITH PREVIOUSLY REPORTED AND CALLED VALUE. CRITICAL RESULT CALLED TO, READ BACK BY AND VERIFIED WITH: PHARMD E. Hassell Done [id number] @0815  FH    Culture (A)  Final    ESCHERICHIA COLI SUSCEPTIBILITIES PERFORMED ON PREVIOUS CULTURE WITHIN THE LAST 5 DAYS. Performed at [facility], [place] [address], [place], [place] [address]    Report Status 03/22/2022 FINAL  Final  Culture, blood (Routine X 2) w Reflex to ID Panel     Status: None (Preliminary result)   Collection Time: 03/21/22 12:33 AM  Specimen: BLOOD LEFT ARM  Result Value Ref Range Status   Specimen Description BLOOD LEFT ARM  Final   Special Requests   Final    BOTTLES DRAWN AEROBIC ONLY Blood Culture results may not be optimal due to an inadequate volume of blood  received in culture bottles   Culture   Final    NO GROWTH 4 DAYS Performed at [facility], [place] [address] [place], [place] [address]    Report Status PENDING  Incomplete         Radiology Studies: ECHOCARDIOGRAM COMPLETE  Result Date: 03/24/2022    ECHOCARDIOGRAM REPORT   Patient Name:   ABBIE RUSCHAK Date of Exam: 03/24/2022 Medical Rec #:  [id number]                  Height:       64.0 in Accession #:    [id number]                 Weight:       191.8 lb Date of Birth:  [birth year]                  BSA:          1.922 m Patient Age:    8 years                   BP:           127/71 mmHg Patient Gender: M                          HR:           89 bpm. Exam Location:  Inpatient Procedure: 2D Echo, Color Doppler and Cardiac Doppler Indications:     Endocardititis  History:         Patient has no prior history of Echocardiogram examinations.  Sonographer:     Merrie Roof RDCS Referring Phys:  [place] Diagnosing Phys: Oswaldo Milian MD IMPRESSIONS  1. Left ventricular ejection fraction, by estimation, is 60 to 65%. The left ventricle has normal function. The left ventricle has no regional wall motion abnormalities. Left ventricular diastolic parameters were normal.  2. Right ventricular systolic function is normal. The right ventricular size is normal. Tricuspid regurgitation signal is inadequate for assessing PA pressure.  3. The mitral valve is grossly normal. No evidence of mitral valve regurgitation. No evidence of mitral stenosis.  4. The aortic valve was not well visualized. Aortic valve regurgitation is not visualized. No aortic stenosis is present.  5. The inferior vena cava is normal in size with greater than 50% respiratory variability, suggesting right atrial pressure of 3 mmHg. Conclusion(s)/Recommendation(s): No evidence of valvular vegetations on this transthoracic echocardiogram, though technically difficult study with poor visualization of valves.  Consider a transesophageal echocardiogram to exclude infective endocarditis if clinically indicated. FINDINGS  Left Ventricle: Left ventricular ejection fraction, by estimation, is 60 to 65%. The left ventricle has normal function. The left ventricle has no regional wall motion abnormalities. The left ventricular internal cavity size was normal in size. There is  no left ventricular hypertrophy. Left ventricular diastolic parameters were normal. Right Ventricle: The right ventricular size is normal. No increase in right ventricular wall thickness. Right ventricular systolic function is normal. Tricuspid regurgitation signal is inadequate for assessing PA pressure. Left Atrium: Left atrial size was normal in size. Right Atrium: Right atrial size was normal in  size. Pericardium: There is no evidence of pericardial effusion. Mitral Valve: The mitral valve is grossly normal. No evidence of mitral valve regurgitation. No evidence of mitral valve stenosis. Tricuspid Valve: The tricuspid valve is grossly normal. Tricuspid valve regurgitation is not demonstrated. Aortic Valve: The aortic valve was not well visualized. Aortic valve regurgitation is not visualized. No aortic stenosis is present. Aortic valve mean gradient measures 3.0 mmHg. Aortic valve peak gradient measures 5.3 mmHg. Aortic valve area, by VTI measures 3.39 cm. Pulmonic Valve: The pulmonic valve was not well visualized. Pulmonic valve regurgitation is not visualized. Aorta: The aortic root is normal in size and structure. Venous: The inferior vena cava is normal in size with greater than 50% respiratory variability, suggesting right atrial pressure of 3 mmHg. IAS/Shunts: The interatrial septum was not well visualized.  LEFT VENTRICLE PLAX 2D LVIDd:         4.70 cm   Diastology LVIDs:         2.80 cm   LV e' medial:    8.38 cm/s LV PW:         0.80 cm   LV E/e' medial:  8.6 LV IVS:        1.00 cm   LV e' lateral:   11.20 cm/s LVOT diam:     2.00 cm   LV E/e'  lateral: 6.4 LV SV:         77 LV SV Index:   40 LVOT Area:     3.14 cm  RIGHT VENTRICLE RV Mid diam:    3.00 cm LEFT ATRIUM             Index        RIGHT ATRIUM           Index LA diam:        3.20 cm 1.67 cm/m   RA Area:     13.10 cm LA Vol (A2C):   46.8 ml 24.35 ml/m  RA Volume:   23.70 ml  12.33 ml/m LA Vol (A4C):   55.9 ml 29.09 ml/m LA Biplane Vol: 53.1 ml 27.63 ml/m  AORTIC VALVE AV Area (Vmax):    2.51 cm AV Area (Vmean):   2.48 cm AV Area (VTI):     3.39 cm AV Vmax:           115.00 cm/s AV Vmean:          87.300 cm/s AV VTI:            0.228 m AV Peak Grad:      5.3 mmHg AV Mean Grad:      3.0 mmHg LVOT Vmax:         92.00 cm/s LVOT Vmean:        68.800 cm/s LVOT VTI:          0.246 m LVOT/AV VTI ratio: 1.08  AORTA Ao Root diam: 3.80 cm MITRAL VALVE MV Area (PHT): 3.34 cm    SHUNTS MV Decel Time: 227 msec    Systemic VTI:  0.25 m MV E velocity: 72.00 cm/s  Systemic Diam: 2.00 cm MV A velocity: 74.60 cm/s MV E/A ratio:  0.97 Epifanio Lesches MD Electronically signed by Epifanio Lesches MD Signature Date/Time: 03/24/2022/2:40:25 PM    Final (Updated)    MR Lumbar Spine W Wo Contrast  Result Date: 03/24/2022 CLINICAL DATA:  Initial evaluation for lower back pain, bacteremia. EXAM: MRI LUMBAR SPINE WITHOUT AND WITH CONTRAST TECHNIQUE: Multiplanar and multiecho pulse sequences of the  lumbar spine were obtained without and with intravenous contrast. CONTRAST:  32mL GADAVIST GADOBUTROL 1 MMOL/ML IV SOLN COMPARISON:  Prior study from 03/19/2022. FINDINGS: Segmentation: Standard. Lowest well-formed disc space labeled the L5-S1 level. Alignment: Physiologic with preservation of the normal lumbar lordosis. No listhesis. Vertebrae: Remotely healed T11 fracture with mild height loss noted. Patient is status post posterior fusion extending from the partially visualized lower thoracic spine through L1. Vertebral body height otherwise maintained with no other acute or chronic fracture. Bone marrow  signal intensity within normal limits. No discrete or worrisome osseous lesions. Minimal reactive endplate change about the L3-4 interspace posteriorly, favored to be degenerative. No other evidence for acute osteomyelitis discitis or septic arthritis. Conus medullaris and cauda equina: Conus extends to the L1 level. Conus and cauda equina appear normal. No epidural collections or abnormal enhancement. Paraspinal and other soft tissues: There is apparent diffuse edema within the lower posterior paraspinous musculature (series 5, images 14, 1), nonspecific, but could reflect changes of acute myositis given history. No loculated collections. Prominent chronic fatty atrophy about the psoas musculature noted bilaterally. Subcentimeter simple cyst noted within the right kidney, benign in appearance, no follow-up imaging recommended. Visualized visceral structures otherwise unremarkable. Disc levels: T11-12: Unremarkable. T12-L1: Prior posterior fusion.  No canal or foraminal stenosis. L1-2: Mild reactive endplate spurring without significant disc bulge. Mild facet hypertrophy. No significant canal or foraminal stenosis. L2-3: Minimal reactive endplate spurring without significant disc bulge. Mild facet spurring. No canal or foraminal stenosis. L3-4: Mild disc bulge with disc desiccation. Associated mild reactive endplate spurring. Mild facet hypertrophy. No significant canal or foraminal stenosis. L4-5: Disc desiccation without significant disc bulge. Mild reactive endplate spurring. Mild facet hypertrophy. No canal or foraminal stenosis. L5-S1: Negative interspace. Mild to moderate facet hypertrophy. No canal or foraminal stenosis. IMPRESSION: 1. Diffuse edema within the lower posterior paraspinous musculature, nonspecific, but could reflect changes of acute myositis given provided history. No loculated or drainable fluid collections. 2. No other convincing evidence for acute infection within the lumbar spine. 3. Mild  for age degenerative spondylosis as above without significant canal or foraminal stenosis. 4. Chronic T11 fracture with prior posterior thoracolumbar fusion, partially visualized. Electronically Signed   By: Jeannine Boga M.D.   On: 03/24/2022 03:38        Scheduled Meds:  atorvastatin  20 mg Oral Q1200   enoxaparin (LOVENOX) injection  40 mg Subcutaneous Q24H   ferrous sulfate  325 mg Oral Daily   insulin aspart  0-9 Units Subcutaneous TID WC   insulin glargine-yfgn  15 Units Subcutaneous QHS   irbesartan  75 mg Oral Daily   linagliptin  5 mg Oral Daily   Continuous Infusions:  meropenem (MERREM) IV 1 g (03/25/22 0915)     LOS: 6 days      Phillips Climes, MD [facility]   To contact the attending provider between 7A-7P or the covering provider during after hours 7P-7A, please log into the web site [email or web address] and access using universal [place] password for that web site. If you do not have the password, please call the hospital operator.  03/25/2022, 9:54 AM

## 2022-03-25 NOTE — Plan of Care (Signed)

## 2022-03-25 NOTE — Care Management (Signed)
Emailed LOG for [facility] RN to [employer] and [employer]. Patient still awaiting TEE for final plan of treatment.

## 2022-03-26 ENCOUNTER — Encounter (HOSPITAL_COMMUNITY): Payer: Self-pay | Admitting: Internal Medicine

## 2022-03-26 ENCOUNTER — Encounter (HOSPITAL_COMMUNITY)
Admission: EM | Disposition: A | Discharge status: Home Health | Payer: Self-pay | Source: Home / Self Care | Attending: Internal Medicine

## 2022-03-26 ENCOUNTER — Inpatient Hospital Stay (HOSPITAL_COMMUNITY): Payer: Medicaid Other | Admitting: Certified Registered"

## 2022-03-26 ENCOUNTER — Inpatient Hospital Stay (HOSPITAL_COMMUNITY): Payer: Medicaid Other

## 2022-03-26 ENCOUNTER — Telehealth: Payer: Self-pay

## 2022-03-26 DIAGNOSIS — R7881 Bacteremia: Secondary | ICD-10-CM

## 2022-03-26 DIAGNOSIS — E119 Type 2 diabetes mellitus without complications: Secondary | ICD-10-CM

## 2022-03-26 DIAGNOSIS — I340 Nonrheumatic mitral (valve) insufficiency: Secondary | ICD-10-CM

## 2022-03-26 DIAGNOSIS — I10 Essential (primary) hypertension: Secondary | ICD-10-CM

## 2022-03-26 HISTORY — PX: TEE WITHOUT CARDIOVERSION: SHX5443

## 2022-03-26 LAB — BASIC METABOLIC PANEL
Anion gap: 9 (ref 5–15)
BUN: 8 mg/dL (ref 6–20)
CO2: 24 mmol/L (ref 22–32)
Calcium: 8.4 mg/dL — ABNORMAL LOW (ref 8.9–10.3)
Chloride: 102 mmol/L (ref 98–111)
Creatinine, Ser: 0.74 mg/dL (ref 0.61–1.24)
GFR, Estimated: >60 mL/min (ref >60)
Glucose, Bld: 138 mg/dL — ABNORMAL HIGH (ref 70–99)
Potassium: 4 mmol/L (ref 3.5–5.1)
Sodium: 135 mmol/L (ref 135–145)

## 2022-03-26 LAB — CBC
HCT: 23.4 % — ABNORMAL LOW (ref 39.0–52.0)
Hemoglobin: 7.5 g/dL — ABNORMAL LOW (ref 13.0–17.0)
MCH: 26.9 pg (ref 26.0–34.0)
MCHC: 32.1 g/dL (ref 30.0–36.0)
MCV: 83.9 fL (ref 80.0–100.0)
Platelets: 352 10*3/uL (ref 150–400)
RBC: 2.79 MIL/uL — ABNORMAL LOW (ref 4.22–5.81)
RDW: 13.6 % (ref 11.5–15.5)
WBC: 7.3 10*3/uL (ref 4.0–10.5)
nRBC: 0 % (ref 0.0–0.2)

## 2022-03-26 LAB — CULTURE, BLOOD (ROUTINE X 2): Culture: NO GROWTH

## 2022-03-26 LAB — GLUCOSE, CAPILLARY
Glucose-Capillary: 112 mg/dL — ABNORMAL HIGH (ref 70–99)
Glucose-Capillary: 132 mg/dL — ABNORMAL HIGH (ref 70–99)
Glucose-Capillary: 172 mg/dL — ABNORMAL HIGH (ref 70–99)
Glucose-Capillary: 194 mg/dL — ABNORMAL HIGH (ref 70–99)
Glucose-Capillary: 207 mg/dL — ABNORMAL HIGH (ref 70–99)

## 2022-03-26 SURGERY — ECHOCARDIOGRAM, TRANSESOPHAGEAL
Anesthesia: Monitor Anesthesia Care

## 2022-03-26 MED ORDER — SODIUM CHLORIDE 0.9% FLUSH
10.0000 mL | Freq: Two times a day (BID) | INTRAVENOUS | Status: DC
  Administered 2022-03-26 – 2022-03-30 (×5): 10 mL

## 2022-03-26 MED ORDER — CHLORHEXIDINE GLUCONATE CLOTH 2 % EX PADS
6.0000 | MEDICATED_PAD | Freq: Every day | CUTANEOUS | Status: DC
  Administered 2022-03-26 – 2022-03-29 (×4): 6 via TOPICAL

## 2022-03-26 MED ORDER — SODIUM CHLORIDE 0.9 % IV SOLN: INTRAVENOUS | Status: DC

## 2022-03-26 MED ORDER — SODIUM CHLORIDE 0.9% FLUSH: 10.0000 mL | INTRAVENOUS | Status: DC | PRN

## 2022-03-26 MED ORDER — PROPOFOL 500 MG/50ML IV EMUL
INTRAVENOUS | Status: DC | PRN
  Administered 2022-03-26: 175 ug/kg/min via INTRAVENOUS

## 2022-03-26 NOTE — CV Procedure (Signed)
    Transesophageal Echocardiogram Note  John Meza [id number] [birth year]  Procedure: Transesophageal Echocardiogram Indications: bacteremia   Procedure Details Consent: Obtained Time Out: Verified patient identification, verified procedure, site/side was marked, verified correct patient position, special equipment/implants available, Radiology Safety Procedures followed,  medications/allergies/relevent history reviewed, required imaging and test results available.  Performed  Medications:  During this procedure the patient is administered a profofol drip by [name]  sedation.  The patient's heart rate, blood pressure, and oxygen saturation are monitored continuously during the procedure. The period of conscious sedation is 30  minutes, of which I was present face-to-face 100% of this time.  Left Ventrical:  normal LV function   Mitral Valve: mild MR , no vegetation   Aortic Valve: trace AI , no vegetation   Tricuspid Valve: normal , no vegetation   Pulmonic Valve: normal , vegetation   Left Atrium/ Left atrial appendage: normal , no LAA thrombus   Atrial septum: intact,   Aorta: minimal plaque    Complications:  Patient did  tolerate procedure well.   John Meza, John Meza., MD, [facility] 03/26/2022, 9:13 AM

## 2022-03-26 NOTE — Progress Notes (Signed)
  Echocardiogram Echocardiogram Transesophageal has been performed.  Janalyn Harder 03/26/2022, 8:41 AM

## 2022-03-26 NOTE — Plan of Care (Signed)
  Problem: Education: Goal: Ability to describe self-care measures that may prevent or decrease complications (Diabetes Survival Skills Education) will improve Outcome: Progressing   Problem: Coping: Goal: Ability to adjust to condition or change in health will improve Outcome: Progressing   Problem: Fluid Volume: Goal: Ability to maintain a balanced intake and output will improve Outcome: Progressing   

## 2022-03-26 NOTE — Telephone Encounter (Signed)
I spoke to Anise Salvo [employer] regarding delivery of sample catheters. He said that 2 boxes of 16 fr pre-lubricated catheters were shipped overnight to patient's home on 03/23/2022.   He did not have a tracking number available.  These were samples, no charge.  I explained to him that I am waiting on a response from patient assistance fund regarding payment for catheters going forward. .   I called patient's wife with assistance of Spanish interpreter [id number]/[employer]- message left on # [phone], call back requested.  Call also placed to [phone], voicemail not set up. Trying to confirm if the boxes of catheters have been received.   I called [facility] and spoke to patient's wife with Dwana Curd  present as well as a [place] interpreter.  His wife has been with him at the hospital and has not been home, so she is not aware of any deliveries.  She said he daughter would know but she works until 1700 and cannot be called before then.    I spoke to Anise Salvo again and informed him that I have not been able to confirm delivery and he is still waiting for a tracking number to check on delivery.

## 2022-03-26 NOTE — Anesthesia Preprocedure Evaluation (Signed)
Anesthesia Evaluation  Patient identified by MRN, date of birth, ID band Patient awake    Reviewed: Allergy & Precautions, H&P , NPO status , Patient's Chart, lab work & pertinent test results  Airway Mallampati: II   Neck ROM: full    Dental   Pulmonary former smoker,    breath sounds clear to auscultation       Cardiovascular hypertension,  Rhythm:regular Rate:Normal     Neuro/Psych paraplegia  Neuromuscular disease    GI/Hepatic   Endo/Other  diabetes, Type 2  Renal/GU      Musculoskeletal   Abdominal   Peds  Hematology  (+) Blood dyscrasia, anemia ,   Anesthesia Other Findings   Reproductive/Obstetrics                             Anesthesia Physical Anesthesia Plan  ASA: 3  Anesthesia Plan: MAC   Post-op Pain Management:    Induction: Intravenous  PONV Risk Score and Plan: 1 and Propofol infusion and Treatment may vary due to age or medical condition  Airway Management Planned: Nasal Cannula  Additional Equipment:   Intra-op Plan:   Post-operative Plan:   Informed Consent: I have reviewed the patients History and Physical, chart, labs and discussed the procedure including the risks, benefits and alternatives for the proposed anesthesia with the patient or authorized representative who has indicated his/her understanding and acceptance.     Dental advisory given  Plan Discussed with: CRNA, Anesthesiologist and Surgeon  Anesthesia Plan Comments:         Anesthesia Quick Evaluation

## 2022-03-26 NOTE — Anesthesia Postprocedure Evaluation (Signed)
Anesthesia Post Note  Patient: John Meza  Procedure(s) Performed: TRANSESOPHAGEAL ECHOCARDIOGRAM (TEE)     Patient location during evaluation: Endoscopy Anesthesia Type: MAC Level of consciousness: awake and alert Pain management: pain level controlled Vital Signs Assessment: post-procedure vital signs reviewed and stable Respiratory status: spontaneous breathing, nonlabored ventilation, respiratory function stable and patient connected to nasal cannula oxygen Cardiovascular status: stable and blood pressure returned to baseline Postop Assessment: no apparent nausea or vomiting Anesthetic complications: no   No notable events documented.  Last Vitals:  Vitals:   03/26/22 0915 03/26/22 0928  BP: 113/69 118/70  Pulse: 88 88  Resp: 15 15  Temp:  37.1 C  SpO2: 97% 97%    Last Pain:  Vitals:   03/26/22 0928  TempSrc:   PainSc: 0-No pain                 Earl Losee S

## 2022-03-26 NOTE — Interval H&P Note (Signed)
History and Physical Interval Note:  03/26/2022 8:05 AM  John Meza  has presented today for surgery, with the diagnosis of BACTOREMIA.  The various methods of treatment have been discussed with the patient and family. After consideration of risks, benefits and other options for treatment, the patient has consented to  Procedure(s): TRANSESOPHAGEAL ECHOCARDIOGRAM (TEE) (N/A) as a surgical intervention.  The patient's history has been reviewed, patient examined, no change in status, stable for surgery.  I have reviewed the patient's chart and labs.  Questions were answered to the patient's satisfaction.     Kristeen Miss

## 2022-03-26 NOTE — Transfer of Care (Signed)
Immediate Anesthesia Transfer of Care Note  Patient: John Meza  Procedure(s) Performed: TRANSESOPHAGEAL ECHOCARDIOGRAM (TEE)  Patient Location: PACU  Anesthesia Type:MAC  Level of Consciousness: drowsy and patient cooperative  Airway & Oxygen Therapy: Patient Spontanous Breathing  Post-op Assessment: Report given to RN and Post -op Vital signs reviewed and stable  Post vital signs: Reviewed and stable  Last Vitals:  Vitals Value Taken Time  BP 114/64 03/26/22 0842  Temp    Pulse 97 03/26/22 0845  Resp 27 03/26/22 0845  SpO2 96 % 03/26/22 0845  Vitals shown include unvalidated device data.  Last Pain:  Vitals:   03/26/22 0734  TempSrc: Temporal  PainSc: 0-No pain         Complications: No notable events documented.

## 2022-03-26 NOTE — Progress Notes (Addendum)
[facility]    Date of Admission:  03/19/2022   Total days of antibiotics 8           ID: John Meza is a 61 y.o. male with  esbl ecoli recurrent bacteremia Active Problems:   HLD (hyperlipidemia)   Paraplegia (HCC)   Neurogenic bladder   Insulin dependent type 2 diabetes mellitus, controlled (HCC)   Essential hypertension   Lower urinary tract infectious disease   Sepsis (HCC)    Subjective: TEE negative. Patient feeling better. Getting ready for picc line placement  12 point ros is negative  Medications:   atorvastatin  20 mg Oral Q1200   enoxaparin (LOVENOX) injection  40 mg Subcutaneous Q24H   ferrous sulfate  325 mg Oral Daily   insulin aspart  0-9 Units Subcutaneous TID WC   insulin glargine-yfgn  15 Units Subcutaneous QHS   irbesartan  75 mg Oral Daily   linagliptin  5 mg Oral Daily    Objective: Vital signs in last 24 hours: Temp:  [97.7 F (36.5 C)-99.6 F (37.6 C)] 98.7 F (37.1 C) (06/15 0928) Pulse Rate:  [83-100] 88 (06/15 0928) Resp:  [15-24] 15 (06/15 0928) BP: (113-137)/(62-81) 118/70 (06/15 0928) SpO2:  [93 %-97 %] 97 % (06/15 0928)  Physical Exam  Constitutional: He is oriented to person, place, and time. He appears well-developed and well-nourished. No distress.  HENT:  Mouth/Throat: Oropharynx is clear and moist. No oropharyngeal exudate.  Cardiovascular: Normal rate, regular rhythm and normal heart sounds. Exam reveals no gallop and no friction rub.  No murmur heard.  Pulmonary/Chest: Effort normal and breath sounds normal. No respiratory distress. He has no wheezes.  Abdominal: Soft. Bowel sounds are normal. He exhibits no distension. There is no tenderness.  Lymphadenopathy:  He has no cervical adenopathy.  Neurological: He is alert and oriented to person, place, and time.  Skin: Skin is warm and dry. No rash noted. No erythema.  Psychiatric: He has a normal mood and affect. His behavior is  normal.    Lab Results Recent Labs    03/25/22 0008 03/26/22 0124  WBC 7.8 7.3  HGB 7.6* 7.5*  HCT 22.9* 23.4*  NA 135 135  K 4.0 4.0  CL 103 102  CO2 24 24  BUN 8 8  CREATININE 0.74 0.74   Liver Panel Recent Labs    03/25/22 0008  PROT 5.5*  ALBUMIN 1.6*  AST 29  ALT 28  ALKPHOS 137*  BILITOT 0.4     Microbiology: reviewed Studies/Results: [place] EKG SITE RITE  Result Date: 03/25/2022 If Site Rite image not attached, placement could not be confirmed due to current cardiac rhythm.  ECHOCARDIOGRAM COMPLETE  Result Date: 03/24/2022    ECHOCARDIOGRAM REPORT   Patient Name:   John Meza Date of Exam: 03/24/2022 Medical Rec #:  [id number]                  Height:       64.0 in Accession #:    [id number]                 Weight:       191.8 lb Date of Birth:  [birth year]                  BSA:          1.922 m Patient Age:    76 years  BP:           127/71 mmHg Patient Gender: M                          HR:           89 bpm. Exam Location:  Inpatient Procedure: 2D Echo, Color Doppler and Cardiac Doppler Indications:     Endocardititis  History:         Patient has no prior history of Echocardiogram examinations.  Sonographer:     Roosvelt Maser RDCS Referring Phys:  [id number] Diagnosing Phys: Epifanio Lesches MD IMPRESSIONS  1. Left ventricular ejection fraction, by estimation, is 60 to 65%. The left ventricle has normal function. The left ventricle has no regional wall motion abnormalities. Left ventricular diastolic parameters were normal.  2. Right ventricular systolic function is normal. The right ventricular size is normal. Tricuspid regurgitation signal is inadequate for assessing PA pressure.  3. The mitral valve is grossly normal. No evidence of mitral valve regurgitation. No evidence of mitral stenosis.  4. The aortic valve was not well visualized. Aortic valve regurgitation is not visualized. No aortic stenosis is present.  5. The  inferior vena cava is normal in size with greater than 50% respiratory variability, suggesting right atrial pressure of 3 mmHg. Conclusion(s)/Recommendation(s): No evidence of valvular vegetations on this transthoracic echocardiogram, though technically difficult study with poor visualization of valves. Consider a transesophageal echocardiogram to exclude infective endocarditis if clinically indicated. FINDINGS  Left Ventricle: Left ventricular ejection fraction, by estimation, is 60 to 65%. The left ventricle has normal function. The left ventricle has no regional wall motion abnormalities. The left ventricular internal cavity size was normal in size. There is  no left ventricular hypertrophy. Left ventricular diastolic parameters were normal. Right Ventricle: The right ventricular size is normal. No increase in right ventricular wall thickness. Right ventricular systolic function is normal. Tricuspid regurgitation signal is inadequate for assessing PA pressure. Left Atrium: Left atrial size was normal in size. Right Atrium: Right atrial size was normal in size. Pericardium: There is no evidence of pericardial effusion. Mitral Valve: The mitral valve is grossly normal. No evidence of mitral valve regurgitation. No evidence of mitral valve stenosis. Tricuspid Valve: The tricuspid valve is grossly normal. Tricuspid valve regurgitation is not demonstrated. Aortic Valve: The aortic valve was not well visualized. Aortic valve regurgitation is not visualized. No aortic stenosis is present. Aortic valve mean gradient measures 3.0 mmHg. Aortic valve peak gradient measures 5.3 mmHg. Aortic valve area, by VTI measures 3.39 cm. Pulmonic Valve: The pulmonic valve was not well visualized. Pulmonic valve regurgitation is not visualized. Aorta: The aortic root is normal in size and structure. Venous: The inferior vena cava is normal in size with greater than 50% respiratory variability, suggesting right atrial pressure of 3  mmHg. IAS/Shunts: The interatrial septum was not well visualized.  LEFT VENTRICLE PLAX 2D LVIDd:         4.70 cm   Diastology LVIDs:         2.80 cm   LV e' medial:    8.38 cm/s LV PW:         0.80 cm   LV E/e' medial:  8.6 LV IVS:        1.00 cm   LV e' lateral:   11.20 cm/s LVOT diam:     2.00 cm   LV E/e' lateral: 6.4 LV SV:  77 LV SV Index:   40 LVOT Area:     3.14 cm  RIGHT VENTRICLE RV Mid diam:    3.00 cm LEFT ATRIUM             Index        RIGHT ATRIUM           Index LA diam:        3.20 cm 1.67 cm/m   RA Area:     13.10 cm LA Vol (A2C):   46.8 ml 24.35 ml/m  RA Volume:   23.70 ml  12.33 ml/m LA Vol (A4C):   55.9 ml 29.09 ml/m LA Biplane Vol: 53.1 ml 27.63 ml/m  AORTIC VALVE AV Area (Vmax):    2.51 cm AV Area (Vmean):   2.48 cm AV Area (VTI):     3.39 cm AV Vmax:           115.00 cm/s AV Vmean:          87.300 cm/s AV VTI:            0.228 m AV Peak Grad:      5.3 mmHg AV Mean Grad:      3.0 mmHg LVOT Vmax:         92.00 cm/s LVOT Vmean:        68.800 cm/s LVOT VTI:          0.246 m LVOT/AV VTI ratio: 1.08  AORTA Ao Root diam: 3.80 cm MITRAL VALVE MV Area (PHT): 3.34 cm    SHUNTS MV Decel Time: 227 msec    Systemic VTI:  0.25 m MV E velocity: 72.00 cm/s  Systemic Diam: 2.00 cm MV A velocity: 74.60 cm/s MV E/A ratio:  0.97 Epifanio Lesches MD Electronically signed by Epifanio Lesches MD Signature Date/Time: 03/24/2022/2:40:25 PM    Final (Updated)      Assessment/Plan: Complicated esbl ecoli bacteremia with complicated uti/myositis= planning to do 28 days, currently on day 8 of iv abtx. Can discharge on ertapenem. Suspect that reuse catheters may have been insighting source. Will discuss with community resources to see how to get single use catheter. In the meantime, needs picc, and convert to ertapenem. Will need 20 addn days if discharged home today or early tomorrow. Will reach out to home health to coordinate iv abtx  Discussed treatment plan with dr elgergawy and dr  [name]. And will arrange follow up in the ID office  For urinary catheters = community wellness case manager jane brazeau is handling she is calling [employer] to get a free catheter supply shipment  Spent 50 min with patient in face to face and non-face to face time in coordination of care for bacteremia. Diagnosis: Ecoli bacteremia  Culture Result: esbl ecoli  Allergies  Allergen Reactions   Macrobid [Nitrofurantoin] Itching and Rash    OPAT Orders Discharge antibiotics to be given via PICC line Discharge antibiotics: Per pharmacy protocol ertapenem  Duration: 4 wk End Date: 04/16/22  [facility] Care Per Protocol:  Home health RN for IV administration and teaching; PICC line care and labs.    Labs weekly while on IV antibiotics: _x_ CBC with differential __x BMP  _x_ Please pull PIC at completion of IV antibiotics   Fax weekly labs to [phone]  Clinic Follow Up Appt: 04-21-22 10:00 with Dr Elinor Parkinson   @ [facility]  ------------- Will sign off   [facility] Pager: [phone]  03/26/2022, 10:33 AM

## 2022-03-26 NOTE — Progress Notes (Signed)
PROGRESS NOTE    John Meza  WER:[id number] DOB: [birth year] DOA: 03/19/2022 PCP: Storm Frisk, MD    Chief Complaint  Patient presents with   Code Sepsis    Brief Narrative:   John Meza is a 61 y.o. male with history of diabetes mellitus type 2, hypertension, hyperlipidemia, chronic anemia, paraplegia with neurogenic bladder uses self cath was recently admitted for ESBL E. coli bacteremia was recently on IV antibiotics.  He presents to ER  hypotensive with systolic blood pressure in the 80s with fever leukocytosis concerning for sepsis.  -His work-up significant for recurrent bacteremia, thought to be secondary to urinary source, as well MRI spine significant for myositis, TEE is negative for endocarditis.    Assessment & Plan:   Active Problems:   HLD (hyperlipidemia)   Paraplegia (HCC)   Neurogenic bladder   Insulin dependent type 2 diabetes mellitus, controlled (HCC)   Essential hypertension   Lower urinary tract infectious disease   Sepsis (HCC)  Severe sepsis secondary to recurrent ESBL bacteremia . - MRI of spine showing paraspinal myositis,(no discitis or epidural abscess). -ID input greatly appreciated, for now continue with IV meropenem.  Likely will DC on IV ertapenem for total of 4 weeks treatment, awaiting for further recommendations. -2D echo with no evidence of vegetation, TEE with no evidence of endocarditis - previous MD D/W Dr. Benancio Deeds 6/9 --recommends treatment of urinary infection nonemergent cystoscopy in office and outpatient follow-up with Dr. Berniece Salines of urology as an outpatient-he will set this up at this follow-up and family is aware to call and confirm -Will need PICC line placement.  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 Disposition:   Status is: Inpatient    Consultants:  Orth ID   Subjective:  No significant events overnight, he denies any complaints.  Objective: Vitals:   03/26/22 0842 03/26/22 0900 03/26/22 0915 03/26/22 0928  BP: 114/64 116/68 113/69 118/70  Pulse: 100 90 88 88  Resp: (!) 24 (!) 22 15 15   Temp: 97.7 F (36.5 C)   98.7 F (37.1 C)  TempSrc:      SpO2: 93% 95% 97% 97%  Weight:      Height:        Intake/Output Summary (Last 24 hours) at 03/26/2022 1125 Last data filed at 03/26/2022 03/28/2022 Gross per 24 hour  Intake 650 ml  Output 1300 ml  Net -650 ml   Filed Weights   03/20/22 0700  Weight: 87 kg    Examination:  Awake Alert, Oriented X 3, No new F.N deficits, Normal affect, paraplegic Symmetrical Chest wall movement, Good air movement bilaterally, CTAB RRR,No Gallops,Rubs or new Murmurs, No Parasternal Heave +ve B.Sounds, Abd  Soft, No tenderness, No rebound - guarding or rigidity. No Cyanosis, Clubbing or edema, No new Rash or bruise        Data Reviewed: I have personally reviewed following labs and imaging studies  CBC: Recent Labs  Lab 03/19/22 1612 03/20/22 0340 03/23/22 0828 03/24/22 0147 03/25/22 0008 03/26/22 0124  WBC 18.3* 20.2* 7.5 9.8 7.8 7.3  NEUTROABS 17.3*  --  5.3  --   5.6  --   HGB 9.5* 7.8* 7.3* 8.4* 7.6* 7.5*  HCT 29.3* 24.0* 22.5* 24.8* 22.9* 23.4*  MCV 85.2 86.6 84.6 84.1 83.3 83.9  PLT 428* 361 279 301 326 352    Basic Metabolic Panel: Recent Labs  Lab 03/20/22 0340 03/23/22 0828 03/24/22 0147 03/25/22 0008 03/26/22 0124  NA 132* 136 133* 135 135  K 3.8 3.7 3.7 4.0 4.0  CL 105 107 101 103 102  CO2 20* 23 24 24 24   GLUCOSE 126* 136* 151* 151* 138*  BUN 17 6 8 8 8   CREATININE 1.27* 0.73 0.87 0.74 0.74  CALCIUM 7.7* 8.2* 8.3* 8.0* 8.4*    GFR: Estimated Creatinine Clearance: 100.1 mL/min (by C-G formula based on SCr of 0.74 mg/dL).  Liver Function Tests: Recent Labs  Lab 03/19/22 1612 03/20/22 0340 03/23/22 0828 03/25/22 0008  AST 24 23 34 29  ALT 30 25 29 28   ALKPHOS 181* 169* 146* 137*  BILITOT 1.0 0.4 0.4 0.4  PROT 7.2 5.7* 5.4* 5.5*  ALBUMIN 2.2* 1.8* 1.6* 1.6*    CBG: Recent Labs  Lab 03/25/22 1209 03/25/22 1717 03/25/22 2213 03/26/22 0033 03/26/22 0842  GLUCAP 249* 172* 142* 132* 112*     Recent Results (from the past 240 hour(s))  Urine Culture     Status: None   Collection Time: 03/19/22  2:15 PM   Specimen: Urine   UR  Result Value Ref Range Status   Urine Culture, Routine Final report  Final   Organism ID, Bacteria Comment  Final    Comment: Mixed urogenital flora 25,000-50,000 colony forming units per mL   Urine Culture     Status: Abnormal   Collection Time: 03/19/22  4:04 PM   Specimen: In/Out Cath Urine  Result Value Ref Range Status   Specimen Description IN/OUT CATH URINE  Final   Special Requests   Final    NONE Performed at [facility], [address] [place], [facility] [address]    Culture >=100,000 COLONIES/mL YEAST (A)  Final   Report Status 03/22/2022 FINAL  Final  Blood Culture (routine x 2)     Status: Abnormal   Collection Time: 03/19/22  4:13 PM   Specimen: BLOOD LEFT FOREARM  Result Value Ref Range Status   Specimen Description BLOOD LEFT FOREARM  Final   Special  Requests   Final    BOTTLES DRAWN AEROBIC AND ANAEROBIC Blood Culture adequate volume   Culture  Setup Time   Final    GRAM NEGATIVE RODS ANAEROBIC BOTTLE ONLY CRITICAL RESULT CALLED TO, READ BACK BY AND VERIFIED WITH: [id number] 05/22/2022 @0813  FH Performed at [facility], [address], [place], [facility]    Culture (A)  Final    ESCHERICHIA COLI Confirmed Extended Spectrum Beta-Lactamase Producer (ESBL).  In bloodstream infections from ESBL organisms, carbapenems are preferred over piperacillin/tazobactam. They are shown to have a lower risk of mortality.    Report Status 03/22/2022 FINAL  Final   Organism ID, Bacteria ESCHERICHIA COLI  Final  Susceptibility   Escherichia coli - MIC*    AMPICILLIN >=32 RESISTANT Resistant     CEFAZOLIN >=64 RESISTANT Resistant     CEFEPIME 1 SENSITIVE Sensitive     CEFTAZIDIME RESISTANT Resistant     CEFTRIAXONE 32 RESISTANT Resistant     CIPROFLOXACIN >=4 RESISTANT Resistant     GENTAMICIN <=1 SENSITIVE Sensitive     IMIPENEM <=0.25 SENSITIVE Sensitive     TRIMETH/SULFA >=320 RESISTANT Resistant     AMPICILLIN/SULBACTAM >=32 RESISTANT Resistant     PIP/TAZO 64 INTERMEDIATE Intermediate     * ESCHERICHIA COLI  Blood Culture ID Panel (Reflexed)     Status: Abnormal   Collection Time: 03/19/22  4:13 PM  Result Value Ref Range Status   Enterococcus faecalis NOT DETECTED NOT DETECTED Final   Enterococcus Faecium NOT DETECTED NOT DETECTED Final   Listeria monocytogenes NOT DETECTED NOT DETECTED Final   Staphylococcus species NOT DETECTED NOT DETECTED Final   Staphylococcus aureus (BCID) NOT DETECTED NOT DETECTED Final   Staphylococcus epidermidis NOT DETECTED NOT DETECTED Final   Staphylococcus lugdunensis NOT DETECTED NOT DETECTED Final   Streptococcus species NOT DETECTED NOT DETECTED Final   Streptococcus agalactiae NOT DETECTED NOT DETECTED Final   Streptococcus pneumoniae NOT DETECTED NOT DETECTED Final    Streptococcus pyogenes NOT DETECTED NOT DETECTED Final   A.calcoaceticus-baumannii NOT DETECTED NOT DETECTED Final   Bacteroides fragilis NOT DETECTED NOT DETECTED Final   Enterobacterales DETECTED (A) NOT DETECTED Final    Comment: Enterobacterales represent a large order of gram negative bacteria, not a single organism. CRITICAL RESULT CALLED TO, READ BACK BY AND VERIFIED WITH: PHARMD E. Daphine DeutscherMARTIN [phone] @0815  FH    Enterobacter cloacae complex NOT DETECTED NOT DETECTED Final   Escherichia coli DETECTED (A) NOT DETECTED Final    Comment: CRITICAL RESULT CALLED TO, READ BACK BY AND VERIFIED WITH: PHARMD E. Daphine DeutscherMARTIN [phone] @0815  FH    Klebsiella aerogenes NOT DETECTED NOT DETECTED Final   Klebsiella oxytoca NOT DETECTED NOT DETECTED Final   Klebsiella pneumoniae NOT DETECTED NOT DETECTED Final   Proteus species NOT DETECTED NOT DETECTED Final   Salmonella species NOT DETECTED NOT DETECTED Final   Serratia marcescens NOT DETECTED NOT DETECTED Final   Haemophilus influenzae NOT DETECTED NOT DETECTED Final   Neisseria meningitidis NOT DETECTED NOT DETECTED Final   Pseudomonas aeruginosa NOT DETECTED NOT DETECTED Final   Stenotrophomonas maltophilia NOT DETECTED NOT DETECTED Final   Candida albicans NOT DETECTED NOT DETECTED Final   Candida auris NOT DETECTED NOT DETECTED Final   Candida glabrata NOT DETECTED NOT DETECTED Final   Candida krusei NOT DETECTED NOT DETECTED Final   Candida parapsilosis NOT DETECTED NOT DETECTED Final   Candida tropicalis NOT DETECTED NOT DETECTED Final   Cryptococcus neoformans/gattii NOT DETECTED NOT DETECTED Final   CTX-M ESBL DETECTED (A) NOT DETECTED Final    Comment: CRITICAL RESULT CALLED TO, READ BACK BY AND VERIFIED WITH: PHARMD EDaphine Deutscher. MARTIN [phone] @0815  FH (NOTE) Extended spectrum beta-lactamase detected. Recommend a carbapenem as initial therapy.      Carbapenem resistance IMP NOT DETECTED NOT DETECTED Final   Carbapenem resistance KPC NOT DETECTED  NOT DETECTED Final   Carbapenem resistance NDM NOT DETECTED NOT DETECTED Final   Carbapenem resist OXA 48 LIKE NOT DETECTED NOT DETECTED Final   Carbapenem resistance VIM NOT DETECTED NOT DETECTED Final    Comment: Performed at [facility], [address] [place], [place] [address]  SARS Coronavirus 2 by RT PCR (hospital  order, performed in [facility] hospital lab) *cepheid single result test* Anterior Nasal Swab     Status: None   Collection Time: 03/19/22  4:28 PM   Specimen: Anterior Nasal Swab  Result Value Ref Range Status   SARS Coronavirus 2 by RT PCR NEGATIVE NEGATIVE Final    Comment: (NOTE) SARS-CoV-2 target nucleic acids are NOT DETECTED.  The SARS-CoV-2 RNA is generally detectable in upper and lower respiratory specimens during the acute phase of infection. The lowest concentration of SARS-CoV-2 viral copies this assay can detect is 250 copies / mL. A negative result does not preclude SARS-CoV-2 infection and should not be used as the sole basis for treatment or other patient management decisions.  A negative result may occur with improper specimen collection / handling, submission of specimen other than nasopharyngeal swab, presence of viral mutation(s) within the areas targeted by this assay, and inadequate number of viral copies (<250 copies / mL). A negative result must be combined with clinical observations, patient history, and epidemiological information.  Fact Sheet for Patients:   RoadLapTop.co.za  Fact Sheet for Healthcare Providers: http://kim-miller.com/  This test is not yet approved or  cleared by the [place] FDA and has been authorized for detection and/or diagnosis of SARS-CoV-2 by FDA under an Emergency Use Authorization (EUA).  This EUA will remain in effect (meaning this test can be used) for the duration of the COVID-19 declaration under Section 564(b)(1) of the Act, 21 U.S.C. section  360bbb-3(b)(1), unless the authorization is terminated or revoked sooner.  Performed at [facility] Lab, [address], [place], [place] [address]   Blood Culture (routine x 2)     Status: Abnormal   Collection Time: 03/19/22  5:59 PM   Specimen: BLOOD  Result Value Ref Range Status   Specimen Description BLOOD SITE NOT SPECIFIED  Final   Special Requests   Final    BOTTLES DRAWN AEROBIC AND ANAEROBIC Blood Culture adequate volume   Culture  Setup Time   Final    GRAM NEGATIVE RODS IN BOTH AEROBIC AND ANAEROBIC BOTTLES CRITICAL VALUE NOTED.  VALUE IS CONSISTENT WITH PREVIOUSLY REPORTED AND CALLED VALUE. CRITICAL RESULT CALLED TO, READ BACK BY AND VERIFIED WITH: PHARMD E. Daphine Deutscher [id number]  FH    Culture (A)  Final    ESCHERICHIA COLI SUSCEPTIBILITIES PERFORMED ON PREVIOUS CULTURE WITHIN THE LAST 5 DAYS. Performed at [facility], [address] [place], [place] [address]    Report Status 03/22/2022 FINAL  Final  Culture, blood (Routine X 2) w Reflex to ID Panel     Status: None   Collection Time: 03/21/22 12:33 AM   Specimen: BLOOD LEFT ARM  Result Value Ref Range Status   Specimen Description BLOOD LEFT ARM  Final   Special Requests   Final    BOTTLES DRAWN AEROBIC ONLY Blood Culture results may not be optimal due to an inadequate volume of blood received in culture bottles   Culture   Final    NO GROWTH 5 DAYS Performed at [facility], [address], [place], [place] [address]    Report Status 03/26/2022 FINAL  Final         Radiology Studies: [place] EKG SITE RITE  Result Date: 03/25/2022 If Site Rite image not attached, placement could not be confirmed due to current cardiac rhythm.  ECHOCARDIOGRAM COMPLETE  Result Date: 03/24/2022    ECHOCARDIOGRAM REPORT   Patient Name:   Garrit Ronnell [place] Date of  Exam: 03/24/2022 Medical Rec #:  [id number]                  Height:       64.0 in Accession #:    [id number]                 Weight:       191.8  lb Date of Birth:  17-Sep-1961                  BSA:          1.922 m Patient Age:    60 years                   BP:           127/71 mmHg Patient Gender: M                          HR:           89 bpm. Exam Location:  Inpatient Procedure: 2D Echo, Color Doppler and Cardiac Doppler Indications:     Endocardititis  History:         Patient has no prior history of Echocardiogram examinations.  Sonographer:     Roosvelt Maser RDCS Referring Phys:  [id number] [facility] Diagnosing Phys: Epifanio Lesches MD IMPRESSIONS  1. Left ventricular ejection fraction, by estimation, is 60 to 65%. The left ventricle has normal function. The left ventricle has no regional wall motion abnormalities. Left ventricular diastolic parameters were normal.  2. Right ventricular systolic function is normal. The right ventricular size is normal. Tricuspid regurgitation signal is inadequate for assessing PA pressure.  3. The mitral valve is grossly normal. No evidence of mitral valve regurgitation. No evidence of mitral stenosis.  4. The aortic valve was not well visualized. Aortic valve regurgitation is not visualized. No aortic stenosis is present.  5. The inferior vena cava is normal in size with greater than 50% respiratory variability, suggesting right atrial pressure of 3 mmHg. Conclusion(s)/Recommendation(s): No evidence of valvular vegetations on this transthoracic echocardiogram, though technically difficult study with poor visualization of valves. Consider a transesophageal echocardiogram to exclude infective endocarditis if clinically indicated. FINDINGS  Left Ventricle: Left ventricular ejection fraction, by estimation, is 60 to 65%. The left ventricle has normal function. The left ventricle has no regional wall motion abnormalities. The left ventricular internal cavity size was normal in size. There is  no left ventricular hypertrophy. Left ventricular diastolic parameters were normal. Right Ventricle: The right ventricular  size is normal. No increase in right ventricular wall thickness. Right ventricular systolic function is normal. Tricuspid regurgitation signal is inadequate for assessing PA pressure. Left Atrium: Left atrial size was normal in size. Right Atrium: Right atrial size was normal in size. Pericardium: There is no evidence of pericardial effusion. Mitral Valve: The mitral valve is grossly normal. No evidence of mitral valve regurgitation. No evidence of mitral valve stenosis. Tricuspid Valve: The tricuspid valve is grossly normal. Tricuspid valve regurgitation is not demonstrated. Aortic Valve: The aortic valve was not well visualized. Aortic valve regurgitation is not visualized. No aortic stenosis is present. Aortic valve mean gradient measures 3.0 mmHg. Aortic valve peak gradient measures 5.3 mmHg. Aortic valve area, by VTI measures 3.39 cm. Pulmonic Valve: The pulmonic valve was not well visualized. Pulmonic valve regurgitation is not visualized. Aorta: The aortic root is normal in size and structure. Venous: The inferior vena cava is normal  in size with greater than 50% respiratory variability, suggesting right atrial pressure of 3 mmHg. IAS/Shunts: The interatrial septum was not well visualized.  LEFT VENTRICLE PLAX 2D LVIDd:         4.70 cm   Diastology LVIDs:         2.80 cm   LV e' medial:    8.38 cm/s LV PW:         0.80 cm   LV E/e' medial:  8.6 LV IVS:        1.00 cm   LV e' lateral:   11.20 cm/s LVOT diam:     2.00 cm   LV E/e' lateral: 6.4 LV SV:         77 LV SV Index:   40 LVOT Area:     3.14 cm  RIGHT VENTRICLE RV Mid diam:    3.00 cm LEFT ATRIUM             Index        RIGHT ATRIUM           Index LA diam:        3.20 cm 1.67 cm/m   RA Area:     13.10 cm LA Vol (A2C):   46.8 ml 24.35 ml/m  RA Volume:   23.70 ml  12.33 ml/m LA Vol (A4C):   55.9 ml 29.09 ml/m LA Biplane Vol: 53.1 ml 27.63 ml/m  AORTIC VALVE AV Area (Vmax):    2.51 cm AV Area (Vmean):   2.48 cm AV Area (VTI):     3.39 cm AV  Vmax:           115.00 cm/s AV Vmean:          87.300 cm/s AV VTI:            0.228 m AV Peak Grad:      5.3 mmHg AV Mean Grad:      3.0 mmHg LVOT Vmax:         92.00 cm/s LVOT Vmean:        68.800 cm/s LVOT VTI:          0.246 m LVOT/AV VTI ratio: 1.08  AORTA Ao Root diam: 3.80 cm MITRAL VALVE MV Area (PHT): 3.34 cm    SHUNTS MV Decel Time: 227 msec    Systemic VTI:  0.25 m MV E velocity: 72.00 cm/s  Systemic Diam: 2.00 cm MV A velocity: 74.60 cm/s MV E/A ratio:  0.97 Epifanio Lesches MD Electronically signed by Epifanio Lesches MD Signature Date/Time: 03/24/2022/2:40:25 PM    Final (Updated)         Scheduled Meds:  atorvastatin  20 mg Oral Q1200   enoxaparin (LOVENOX) injection  40 mg Subcutaneous Q24H   ferrous sulfate  325 mg Oral Daily   insulin aspart  0-9 Units Subcutaneous TID WC   insulin glargine-yfgn  15 Units Subcutaneous QHS   irbesartan  75 mg Oral Daily   linagliptin  5 mg Oral Daily   Continuous Infusions:  meropenem (MERREM) IV 1 g (03/26/22 0702)     LOS: 7 days      Huey Bienenstock, MD [facility]   To contact the attending provider between 7A-7P or the covering provider during after hours 7P-7A, please log into the web site [email or web address] and access using universal [place] password for that web site. If you do not have the password, please call the hospital operator.  03/26/2022, 11:25 AM

## 2022-03-26 NOTE — Progress Notes (Signed)
PHARMACY CONSULT NOTE FOR:  OUTPATIENT  PARENTERAL ANTIBIOTIC THERAPY (OPAT)  Indication: Recurrent ESBL E.coli bacteremia/myositis  Regimen: Ertapenem 1g IV every 24 hours End date: 04/16/22  IV antibiotic discharge orders are pended. To discharging provider:  please sign these orders via discharge navigator,  Select New Orders & click on the button choice - Manage This Unsigned Work.     Thank you for allowing pharmacy to be a part of this patient's care.  Georgina Pillion, PharmD, BCPS Infectious Diseases Clinical Pharmacist 03/26/2022 10:36 AM   **Pharmacist phone directory can now be found on [email or web address] ([email or web address]).  Listed under [facility].

## 2022-03-26 NOTE — Progress Notes (Signed)
Peripherally Inserted Central Catheter Placement  The IV Nurse has discussed with the patient and/or persons authorized to consent for the patient, the purpose of this procedure and the potential benefits and risks involved with this procedure.  The benefits include less needle sticks, lab draws from the catheter, and the patient may be discharged home with the catheter. Risks include, but not limited to, infection, bleeding, blood clot (thrombus formation), and puncture of an artery; nerve damage and irregular heartbeat and possibility to perform a PICC exchange if needed/ordered by physician.  Alternatives to this procedure were also discussed.  Bard Power PICC patient education guide, fact sheet on infection prevention and patient information card has been provided to patient /or left at bedside.    PICC Placement Documentation  PICC Single Lumen 03/26/22 Right Brachial 39 cm 1 cm (Active)  Indication for Insertion or Continuance of Line Home intravenous therapies (PICC only) 03/26/22 1200  Exposed Catheter (cm) 1 cm 03/26/22 1200  Site Assessment Clean, Dry, Intact 03/26/22 1200  Line Status Flushed;Blood return noted;Saline locked 03/26/22 1200  Dressing Type Transparent;Securing device 03/26/22 1200  Dressing Status Clean, Dry, Intact;Antimicrobial disc in place 03/26/22 1200  Dressing Change Due 04/02/22 03/26/22 1200    Interpreter [id number]   Stacie Glaze Horton 03/26/2022, 12:34 PM

## 2022-03-27 LAB — GLUCOSE, CAPILLARY
Glucose-Capillary: 158 mg/dL — ABNORMAL HIGH (ref 70–99)
Glucose-Capillary: 199 mg/dL — ABNORMAL HIGH (ref 70–99)
Glucose-Capillary: 208 mg/dL — ABNORMAL HIGH (ref 70–99)

## 2022-03-27 MED ORDER — ERTAPENEM IV (FOR PTA / DISCHARGE USE ONLY): 1.0000 g | INTRAVENOUS | 0 refills | Status: DC

## 2022-03-27 MED ORDER — SODIUM CHLORIDE 0.9 % IV SOLN: 1.0000 g | INTRAVENOUS | Status: DC

## 2022-03-27 NOTE — TOC Progression Note (Addendum)
Transition of Care [facility]) - Progression Note    Patient Details  Name: John Meza MRN: [id number] Date of Birth: 07/31/1961  [facility] CM/SW Contact  Beckie Busing, RN Phone [phone]  03/27/2022, 11:53 AM  Clinical Narrative:    CM received message stating that patient is unable to pay for home IC antibiotics that are needed until 7/6. CM has reached out to [facility] with [employer] who confirms that patient has been paying out of pocket for IV home antibiotics and has Letter of guarantee for home health nursing. CM has reached out to [facility] supervisor Raymondo Band to determine if patient will be eligible for letter of guarantee for home antibiotics.   1710 CM and Pam with [employer] for home iv abx at bedside. TOC will not be providing letter of guarantee for IV home antibiotics. Patient and daughter to set up payment plan but unable to set up today because it is after 5pm and the office that processes payments is closed. Patient will have to remain inpatient until Monday.     Expected Discharge Plan: Home w Home Health Services Barriers to Discharge: Continued Medical Work up  Expected Discharge Plan and Services Expected Discharge Plan: Home w Home Health Services In-house Referral: Artist Discharge Planning Services: CM Consult Post Acute Care Choice: Durable Medical Equipment, Home Health Living arrangements for the past 2 months: Single Family Home                 DME Arranged: Other see comment (urinary catheter supplies) DME Agency: [employer] Date DME Agency Contacted: 03/23/22 Time DME Agency Contacted: 1521 Representative spoke with at DME Agency: left message for Anise Salvo at [facility] [phone] [facility] Arranged: RN, IV Antibiotics HH Agency: Surveyor, mining, Other - See comment ([facility] for [facility] RN for home infusion) Date HH Agency Contacted: 03/23/22 Time HH Agency Contacted: 1522 Representative spoke with at [facility] Agency:  Pam with Jenne Campus and Casimiro Needle at [employer]   Social Determinants of Health (SDOH) Interventions    Readmission Risk Interventions     No data to display

## 2022-03-27 NOTE — Progress Notes (Signed)
PROGRESS NOTE    John Meza  OIZ:[id number] DOB: [birth year] DOA: 03/19/2022 PCP: Storm Frisk, MD    Chief Complaint  Patient presents with   Code Sepsis    Brief Narrative:   John Meza is a 61 y.o. male with history of diabetes mellitus type 2, hypertension, hyperlipidemia, chronic anemia, paraplegia with neurogenic bladder uses self cath was recently admitted for ESBL E. coli bacteremia was recently on IV antibiotics.  He presents to ER  hypotensive with systolic blood pressure in the 80s with fever leukocytosis concerning for sepsis.  -His work-up significant for recurrent bacteremia, thought to be secondary to urinary source, as well MRI spine significant for myositis, TEE is negative for endocarditis.    Assessment & Plan:   Active Problems:   HLD (hyperlipidemia)   Paraplegia (HCC)   Neurogenic bladder   Insulin dependent type 2 diabetes mellitus, controlled (HCC)   Essential hypertension   Lower urinary tract infectious disease   Sepsis (HCC)  Severe sepsis secondary to recurrent ESBL bacteremia , and myositis - MRI of spine showing paraspinal myositis,(no discitis or epidural abscess). -ID input greatly appreciated, for now continue with IV meropenem.   will DC on IV ertapenem for total of 4 weeks treatment, with end date 04/16/2022 -2D echo with no evidence of vegetation, TEE with no evidence of endocarditis - previous MD D/W Dr. Benancio Deeds 6/9 --recommends treatment of urinary infection nonemergent cystoscopy in office and outpatient follow-up with Dr. Berniece Salines of urology as an outpatient-he will set this up at this follow-up and family is aware to call and confirm -PICC line inserted 6/15. -Discussed with patient/wife with telemetry interpreter, that patient cannot  REuse his in and out catheters, and they are meant for single use, as reusing may be causing his infection.  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 - Routine management and follow-up   Incidental finding of right hip fracture on imaging upon admission -Input greatly appreciated, not a surgical candidate given he is paraplegic and wheelchair dependent at baseline and insensate and right hip  Diabetes mellitus, type II -BGs are controlled on current dose of Semglee and insulin sliding scale -Metformin remains on hold -back on Sitagliptin     DVT prophylaxis: Lovenox Code Status: Full Family Communication: Wife updated at bedside, teleinterpreter was used  Disposition:   Status is: Inpatient    Consultants:  Orth ID   Subjective:  No significant events overnight, he denies any complaints.  Objective: Vitals:   03/26/22 2000 03/27/22 0000 03/27/22 0337 03/27/22 0800  BP: 128/78 133/69 125/71 (!) 149/66  Pulse:   83   Resp: 20 20 13 20   Temp: 99.2 F (37.3 C) 99.2 F (37.3 C)  98.9 F (37.2 C)  TempSrc: Oral Oral  Oral  SpO2: 99% 97% 98% 99%  Weight:      Height:        Intake/Output Summary (Last 24 hours) at 03/27/2022 1427 Last data filed at 03/27/2022 1100 Gross per 24 hour  Intake 381.36 ml  Output --  Net 381.36 ml   Filed Weights   03/20/22 0700  Weight: 87 kg    Examination:  Awake Alert, Oriented X 3, No new  F.N deficits, Normal affect, paraplegic Symmetrical Chest wall movement, Good air movement bilaterally, CTAB RRR,No Gallops,Rubs or new Murmurs, No Parasternal Heave +ve B.Sounds, Abd Soft, No tenderness, No rebound - guarding or rigidity. No Cyanosis, Clubbing or edema, No new Rash or bruise         Data Reviewed: I have personally reviewed following labs and imaging studies  CBC: Recent Labs  Lab  03/23/22 0828 03/24/22 0147 03/25/22 0008 03/26/22 0124  WBC 7.5 9.8 7.8 7.3  NEUTROABS 5.3  --  5.6  --   HGB 7.3* 8.4* 7.6* 7.5*  HCT 22.5* 24.8* 22.9* 23.4*  MCV 84.6 84.1 83.3 83.9  PLT 279 301 326 352    Basic Metabolic Panel: Recent Labs  Lab 03/23/22 0828 03/24/22 0147 03/25/22 0008 03/26/22 0124  NA 136 133* 135 135  K 3.7 3.7 4.0 4.0  CL 107 101 103 102  CO2 23 24 24 24   GLUCOSE 136* 151* 151* 138*  BUN 6 8 8 8   CREATININE 0.73 0.87 0.74 0.74  CALCIUM 8.2* 8.3* 8.0* 8.4*    GFR: Estimated Creatinine Clearance: 100.1 mL/min (by C-G formula based on SCr of 0.74 mg/dL).  Liver Function Tests: Recent Labs  Lab 03/23/22 0828 03/25/22 0008  AST 34 29  ALT 29 28  ALKPHOS 146* 137*  BILITOT 0.4 0.4  PROT 5.4* 5.5*  ALBUMIN 1.6* 1.6*    CBG: Recent Labs  Lab 03/26/22 0842 03/26/22 1257 03/26/22 1601 03/26/22 2118 03/27/22 0121  GLUCAP 112* 207* 194* 172* 158*     Recent Results (from the past 240 hour(s))  Urine Culture     Status: None   Collection Time: 03/19/22  2:15 PM   Specimen: Urine   UR  Result Value Ref Range Status   Urine Culture, Routine Final report  Final   Organism ID, Bacteria Comment  Final    Comment: Mixed urogenital flora 25,000-50,000 colony forming units per mL   Urine Culture     Status: Abnormal   Collection Time: 03/19/22  4:04 PM   Specimen: In/Out Cath Urine  Result Value Ref Range Status   Specimen Description IN/OUT CATH URINE  Final   Special Requests   Final    NONE Performed at [facility], [address], [place], [address] [place]    Culture >=100,000 COLONIES/mL YEAST (A)  Final   Report Status 03/22/2022 FINAL  Final  Blood Culture (routine x 2)     Status: Abnormal   Collection Time: 03/19/22  4:13 PM   Specimen: BLOOD LEFT FOREARM  Result Value Ref Range Status   Specimen Description BLOOD LEFT FOREARM  Final   Special Requests   Final    BOTTLES DRAWN AEROBIC AND ANAEROBIC Blood  Culture adequate volume   Culture  Setup Time   Final    GRAM NEGATIVE RODS ANAEROBIC BOTTLE ONLY CRITICAL RESULT CALLED TO, READ BACK BY AND VERIFIED WITH: 05/22/2022 05/19/22 @0813  FH Performed at [facility], [address] [place], [facility] [address]    Culture (A)  Final    ESCHERICHIA COLI Confirmed Extended Spectrum Beta-Lactamase Producer (ESBL).  In bloodstream infections from ESBL organisms, carbapenems are preferred over piperacillin/tazobactam. They are shown to have a lower risk of mortality.    Report Status 03/22/2022 FINAL  Final   Organism ID, Bacteria ESCHERICHIA COLI  Final      Susceptibility   Escherichia coli - MIC*    AMPICILLIN >=32 RESISTANT Resistant  CEFAZOLIN >=64 RESISTANT Resistant     CEFEPIME 1 SENSITIVE Sensitive     CEFTAZIDIME RESISTANT Resistant     CEFTRIAXONE 32 RESISTANT Resistant     CIPROFLOXACIN >=4 RESISTANT Resistant     GENTAMICIN <=1 SENSITIVE Sensitive     IMIPENEM <=0.25 SENSITIVE Sensitive     TRIMETH/SULFA >=320 RESISTANT Resistant     AMPICILLIN/SULBACTAM >=32 RESISTANT Resistant     PIP/TAZO 64 INTERMEDIATE Intermediate     * ESCHERICHIA COLI  Blood Culture ID Panel (Reflexed)     Status: Abnormal   Collection Time: 03/19/22  4:13 PM  Result Value Ref Range Status   Enterococcus faecalis NOT DETECTED NOT DETECTED Final   Enterococcus Faecium NOT DETECTED NOT DETECTED Final   Listeria monocytogenes NOT DETECTED NOT DETECTED Final   Staphylococcus species NOT DETECTED NOT DETECTED Final   Staphylococcus aureus (BCID) NOT DETECTED NOT DETECTED Final   Staphylococcus epidermidis NOT DETECTED NOT DETECTED Final   Staphylococcus lugdunensis NOT DETECTED NOT DETECTED Final   Streptococcus species NOT DETECTED NOT DETECTED Final   Streptococcus agalactiae NOT DETECTED NOT DETECTED Final   Streptococcus pneumoniae NOT DETECTED NOT DETECTED Final   Streptococcus pyogenes NOT DETECTED NOT DETECTED Final    A.calcoaceticus-baumannii NOT DETECTED NOT DETECTED Final   Bacteroides fragilis NOT DETECTED NOT DETECTED Final   Enterobacterales DETECTED (A) NOT DETECTED Final    Comment: Enterobacterales represent a large order of gram negative bacteria, not a single organism. CRITICAL RESULT CALLED TO, READ BACK BY AND VERIFIED WITH: PHARMD E. Daphine Deutscher [id number]  FH    Enterobacter cloacae complex NOT DETECTED NOT DETECTED Final   Escherichia coli DETECTED (A) NOT DETECTED Final    Comment: CRITICAL RESULT CALLED TO, READ BACK BY AND VERIFIED WITH: PHARMD EQuintella Baton  FH    Klebsiella aerogenes NOT DETECTED NOT DETECTED Final   Klebsiella oxytoca NOT DETECTED NOT DETECTED Final   Klebsiella pneumoniae NOT DETECTED NOT DETECTED Final   Proteus species NOT DETECTED NOT DETECTED Final   Salmonella species NOT DETECTED NOT DETECTED Final   Serratia marcescens NOT DETECTED NOT DETECTED Final   Haemophilus influenzae NOT DETECTED NOT DETECTED Final   Neisseria meningitidis NOT DETECTED NOT DETECTED Final   Pseudomonas aeruginosa NOT DETECTED NOT DETECTED Final   Stenotrophomonas maltophilia NOT DETECTED NOT DETECTED Final   Candida albicans NOT DETECTED NOT DETECTED Final   Candida auris NOT DETECTED NOT DETECTED Final   Candida glabrata NOT DETECTED NOT DETECTED Final   Candida krusei NOT DETECTED NOT DETECTED Final   Candida parapsilosis NOT DETECTED NOT DETECTED Final   Candida tropicalis NOT DETECTED NOT DETECTED Final   Cryptococcus neoformans/gattii NOT DETECTED NOT DETECTED Final   CTX-M ESBL DETECTED (A) NOT DETECTED Final    Comment: CRITICAL RESULT CALLED TO, READ BACK BY AND VERIFIED WITH: PHARMD EDaphine Deutscher [id number]  FH (NOTE) Extended spectrum beta-lactamase detected. Recommend a carbapenem as initial therapy.      Carbapenem resistance IMP NOT DETECTED NOT DETECTED Final   Carbapenem resistance KPC NOT DETECTED NOT DETECTED Final   Carbapenem resistance NDM NOT  DETECTED NOT DETECTED Final   Carbapenem resist OXA 48 LIKE NOT DETECTED NOT DETECTED Final   Carbapenem resistance VIM NOT DETECTED NOT DETECTED Final    Comment: Performed at [facility], [address], [place], [place] [address]  SARS Coronavirus 2 by RT PCR (hospital order, performed in [facility] hospital lab) *cepheid single result test* Anterior Nasal Swab  Status: None   Collection Time: 03/19/22  4:28 PM   Specimen: Anterior Nasal Swab  Result Value Ref Range Status   SARS Coronavirus 2 by RT PCR NEGATIVE NEGATIVE Final    Comment: (NOTE) SARS-CoV-2 target nucleic acids are NOT DETECTED.  The SARS-CoV-2 RNA is generally detectable in upper and lower respiratory specimens during the acute phase of infection. The lowest concentration of SARS-CoV-2 viral copies this assay can detect is 250 copies / mL. A negative result does not preclude SARS-CoV-2 infection and should not be used as the sole basis for treatment or other patient management decisions.  A negative result may occur with improper specimen collection / handling, submission of specimen other than nasopharyngeal swab, presence of viral mutation(s) within the areas targeted by this assay, and inadequate number of viral copies (<250 copies / mL). A negative result must be combined with clinical observations, patient history, and epidemiological information.  Fact Sheet for Patients:   RoadLapTop.co.za  Fact Sheet for Healthcare Providers: http://kim-miller.com/  This test is not yet approved or  cleared by the [place] FDA and has been authorized for detection and/or diagnosis of SARS-CoV-2 by FDA under an Emergency Use Authorization (EUA).  This EUA will remain in effect (meaning this test can be used) for the duration of the COVID-19 declaration under Section 564(b)(1) of the Act, 21 U.S.C. section 360bbb-3(b)(1), unless the authorization is terminated  or revoked sooner.  Performed at [facility], [address], [place], [place] [address]   Blood Culture (routine x 2)     Status: Abnormal   Collection Time: 03/19/22  5:59 PM   Specimen: BLOOD  Result Value Ref Range Status   Specimen Description BLOOD SITE NOT SPECIFIED  Final   Special Requests   Final    BOTTLES DRAWN AEROBIC AND ANAEROBIC Blood Culture adequate volume   Culture  Setup Time   Final    GRAM NEGATIVE RODS IN BOTH AEROBIC AND ANAEROBIC BOTTLES CRITICAL VALUE NOTED.  VALUE IS CONSISTENT WITH PREVIOUSLY REPORTED AND CALLED VALUE. CRITICAL RESULT CALLED TO, READ BACK BY AND VERIFIED WITH: PHARMD E. Daphine Deutscher [id number] @0815  FH    Culture (A)  Final    ESCHERICHIA COLI SUSCEPTIBILITIES PERFORMED ON PREVIOUS CULTURE WITHIN THE LAST 5 DAYS. Performed at [facility], [address], [place], [place]    Report Status 03/22/2022 FINAL  Final  Culture, blood (Routine X 2) w Reflex to ID Panel     Status: None   Collection Time: 03/21/22 12:33 AM   Specimen: BLOOD LEFT ARM  Result Value Ref Range Status   Specimen Description BLOOD LEFT ARM  Final   Special Requests   Final    BOTTLES DRAWN AEROBIC ONLY Blood Culture results may not be optimal due to an inadequate volume of blood received in culture bottles   Culture   Final    NO GROWTH 5 DAYS Performed at [facility], [address] [place], [place]    Report Status 03/26/2022 FINAL  Final         Radiology Studies: ECHO TEE  Result Date: 03/26/2022    TRANSESOPHOGEAL ECHO REPORT   Patient Name:   QUENTEZ LOBER Date of Exam: 03/26/2022 Medical Rec #:  03/28/2022                  Height:       64.0 in Accession #:    [id number]  Weight:       191.8 lb Date of Birth:  02/02/1961                  BSA:          1.922 m Patient Age:    60 years                   BP:           134/64 mmHg Patient Gender: M                          HR:           105 bpm.  Exam Location:  Inpatient Procedure: Transesophageal Echo and Color Doppler Indications:     Bacteremia  History:         Patient has prior history of Echocardiogram examinations, most                  recent 03/24/2022. Signs/Symptoms:Bacteremia; Risk                  Factors:Hypertension and Dyslipidemia.  Sonographer:     Sheralyn Boatmanina West RDCS Referring Phys:  [id number] [facility] Center[facility]SA[facility] ROBERTS Diagnosing Phys: Kristeen MissPhilip Nahser MD PROCEDURE: After discussion of the risks and benefits of a TEE, an informed consent was obtained from the patient. The transesophogeal probe was passed without difficulty through the esophogus of the patient. Imaged were obtained with the patient in a left lateral decubitus position. Sedation performed by different physician. The patient was monitored while under deep sedation. Anesthestetic sedation was provided intravenously by Anesthesiology: 300mg  of Propofol, 100mg  of Lidocaine. The patient's vital signs; including heart rate, blood pressure, and oxygen saturation; remained stable throughout the procedure. The patient developed no complications during the procedure. IMPRESSIONS  1. Left ventricular ejection fraction, by estimation, is 65 to 70%. The left ventricle has normal function.  2. Right ventricular systolic function is normal. The right ventricular size is normal.  3. No left atrial/left atrial appendage thrombus was detected.  4. The mitral valve is normal in structure. Mild mitral valve regurgitation. No evidence of mitral stenosis.  5. The aortic valve is normal in structure. Aortic valve regurgitation is trivial. FINDINGS  Left Ventricle: Left ventricular ejection fraction, by estimation, is 65 to 70%. The left ventricle has normal function. The left ventricular internal cavity size was normal in size. Right Ventricle: The right ventricular size is normal. Right vetricular wall thickness was not well visualized. Right ventricular systolic function is normal. Left Atrium: Left atrial size was  normal in size. No left atrial/left atrial appendage thrombus was detected. Right Atrium: Right atrial size was normal in size. Pericardium: There is no evidence of pericardial effusion. Mitral Valve: The mitral valve is normal in structure. Mild mitral valve regurgitation. No evidence of mitral valve stenosis. There is no evidence of mitral valve vegetation. Tricuspid Valve: The tricuspid valve is normal in structure. Tricuspid valve regurgitation is trivial. There is no evidence of tricuspid valve vegetation. Aortic Valve: The aortic valve is normal in structure. Aortic valve regurgitation is trivial. Pulmonic Valve: The pulmonic valve was normal in structure. Pulmonic valve regurgitation is trivial. There is no evidence of pulmonic valve vegetation. Aorta: The aortic root and ascending aorta are structurally normal, with no evidence of dilitation. IAS/Shunts: No atrial level shunt detected by color flow Doppler. Kristeen MissPhilip Nahser MD Electronically signed by Kristeen MissPhilip Nahser MD Signature Date/Time: 03/26/2022/5:29:32 PM  Final         Scheduled Meds:  atorvastatin  20 mg Oral Q1200   Chlorhexidine Gluconate Cloth  6 each Topical Daily   enoxaparin (LOVENOX) injection  40 mg Subcutaneous Q24H   ferrous sulfate  325 mg Oral Daily   insulin aspart  0-9 Units Subcutaneous TID WC   insulin glargine-yfgn  15 Units Subcutaneous QHS   irbesartan  75 mg Oral Daily   linagliptin  5 mg Oral Daily   sodium chloride flush  10-40 mL Intracatheter Q12H   Continuous Infusions:  meropenem (MERREM) IV Stopped (03/27/22 0929)     LOS: 8 days      Huey Bienenstock, MD [facility]   To contact the attending provider between 7A-7P or the covering provider during after hours 7P-7A, please log into the web site [email or web address] and access using universal [place] password for that web site. If you do not have the password, please call the hospital operator.  03/27/2022, 2:27 PM

## 2022-03-28 LAB — GLUCOSE, CAPILLARY
Glucose-Capillary: 128 mg/dL — ABNORMAL HIGH (ref 70–99)
Glucose-Capillary: 153 mg/dL — ABNORMAL HIGH (ref 70–99)
Glucose-Capillary: 191 mg/dL — ABNORMAL HIGH (ref 70–99)
Glucose-Capillary: 219 mg/dL — ABNORMAL HIGH (ref 70–99)

## 2022-03-28 NOTE — Progress Notes (Addendum)
PROGRESS NOTE    John Meza  BEM:[id number] DOB: 05-01-1961 DOA: 03/19/2022 PCP: Storm Frisk, MD    Chief Complaint  Patient presents with   Code Sepsis    Brief Narrative:   John Meza is a 61 y.o. male with history of diabetes mellitus type 2, hypertension, hyperlipidemia, chronic anemia, paraplegia with neurogenic bladder uses self cath was recently admitted for ESBL E. coli bacteremia was recently on IV antibiotics.  He presents to ER  hypotensive with systolic blood pressure in the 80s with fever leukocytosis concerning for sepsis.  -His work-up significant for recurrent bacteremia, thought to be secondary to urinary source, as well MRI spine significant for myositis, TEE is negative for endocarditis.    Assessment & Plan:   Active Problems:   HLD (hyperlipidemia)   Paraplegia (HCC)   Neurogenic bladder   Insulin dependent type 2 diabetes mellitus, controlled (HCC)   Essential hypertension   Lower urinary tract infectious disease   Sepsis (HCC)  Severe sepsis secondary to recurrent ESBL bacteremia , and myositis - MRI of spine showing paraspinal myositis,(no discitis or epidural abscess). -ID input greatly appreciated, for now continue with IV meropenem.   will DC on IV ertapenem for total of 4 weeks treatment, with end date 04/16/2022 -2D echo with no evidence of vegetation, TEE with no evidence of endocarditis - previous MD D/W Dr. Benancio Deeds 6/9 --recommends treatment of urinary infection nonemergent cystoscopy in office and outpatient follow-up with Dr. Berniece Salines of urology as an outpatient-he will set this up at this follow-up and family is aware to call and confirm -PICC line inserted 6/15. -Discussed with patient/wife with telemetry interpreter, that patient cannot  REuse his in and out catheters, and they are meant for single use, as reusing may be causing his infection.  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 - Routine management and follow-up   Incidental finding of right hip fracture on imaging upon admission -Input greatly appreciated, not a surgical candidate given he is paraplegic and wheelchair dependent at baseline and insensate and right hip  Diabetes mellitus, type II -BGs are controlled on current dose of Semglee and insulin sliding scale -Metformin remains on hold -back on Sitagliptin     DVT prophylaxis: Lovenox Code Status: Full Family Communication: Wife updated at bedside, teleinterpreter was used  Disposition:   Status is: Inpatient, patient is medically stable for discharge, but awaiting arrangement for IV antibiotics on discharge via PICC line.    Consultants:  Alice Rieger ID    Subjective:  No significant events overnight, he denies any complaints.  Objective: Vitals:   03/28/22 0000 03/28/22 0311 03/28/22 0800 03/28/22 1200  BP: 140/79 121/66 114/61 134/72  Pulse: 84 86 82 80  Resp: 15 19 10 16   Temp: 98.6 F (37 C) 98.4 F (36.9 C)    TempSrc: Oral Oral    SpO2: 98% 99% 98%   Weight:      Height:        Intake/Output Summary (Last 24 hours) at 03/28/2022 1511 Last data filed at 03/27/2022 2000 Gross per 24 hour  Intake --  Output 400 ml  Net -400 ml   Filed Weights   03/20/22 0700  Weight:  87 kg    Examination:  Awake Alert, Oriented X 3, No new F.N deficits, Normal affect Symmetrical Chest wall movement, Good air movement bilaterally, CTAB RRR,No Gallops,Rubs or new Murmurs, No Parasternal Heave +ve B.Sounds, Abd Soft, No tenderness, No rebound - guarding or rigidity. No Cyanosis, Clubbing or edema, No new Rash or bruise           Data Reviewed: I have  personally reviewed following labs and imaging studies  CBC: Recent Labs  Lab 03/23/22 0828 03/24/22 0147 03/25/22 0008 03/26/22 0124  WBC 7.5 9.8 7.8 7.3  NEUTROABS 5.3  --  5.6  --   HGB 7.3* 8.4* 7.6* 7.5*  HCT 22.5* 24.8* 22.9* 23.4*  MCV 84.6 84.1 83.3 83.9  PLT 279 301 326 352    Basic Metabolic Panel: Recent Labs  Lab 03/23/22 0828 03/24/22 0147 03/25/22 0008 03/26/22 0124  NA 136 133* 135 135  K 3.7 3.7 4.0 4.0  CL 107 101 103 102  CO2 23 24 24 24   GLUCOSE 136* 151* 151* 138*  BUN 6 8 8 8   CREATININE 0.73 0.87 0.74 0.74  CALCIUM 8.2* 8.3* 8.0* 8.4*    GFR: Estimated Creatinine Clearance: 100.1 mL/min (by C-G formula based on SCr of 0.74 mg/dL).  Liver Function Tests: Recent Labs  Lab 03/23/22 0828 03/25/22 0008  AST 34 29  ALT 29 28  ALKPHOS 146* 137*  BILITOT 0.4 0.4  PROT 5.4* 5.5*  ALBUMIN 1.6* 1.6*    CBG: Recent Labs  Lab 03/27/22 0121 03/27/22 1726 03/27/22 2247 03/28/22 0849 03/28/22 1150  GLUCAP 158* 199* 208* 128* 153*     Recent Results (from the past 240 hour(s))  Urine Culture     Status: None   Collection Time: 03/19/22  2:15 PM   Specimen: Urine   UR  Result Value Ref Range Status   Urine Culture, Routine Final report  Final   Organism ID, Bacteria Comment  Final    Comment: Mixed urogenital flora 25,000-50,000 colony forming units per mL   Urine Culture     Status: Abnormal   Collection Time: 03/19/22  4:04 PM   Specimen: In/Out Cath Urine  Result Value Ref Range Status   Specimen Description IN/OUT CATH URINE  Final   Special Requests   Final    NONE Performed at [facility], [address], [place], [address] [place]    Culture >=100,000 COLONIES/mL YEAST (A)  Final   Report Status 03/22/2022 FINAL  Final  Blood Culture (routine x 2)     Status: Abnormal   Collection Time: 03/19/22  4:13 PM   Specimen: BLOOD LEFT FOREARM  Result Value Ref Range Status   Specimen Description BLOOD LEFT FOREARM   Final   Special Requests   Final    BOTTLES DRAWN AEROBIC AND ANAEROBIC Blood Culture adequate volume   Culture  Setup Time   Final    GRAM NEGATIVE RODS ANAEROBIC BOTTLE ONLY CRITICAL RESULT CALLED TO, READ BACK BY AND VERIFIED WITH: 05/22/2022 05/19/22 @0813  FH Performed at [facility], [address], [place], [facility] [address]    Culture (A)  Final    ESCHERICHIA COLI Confirmed Extended Spectrum Beta-Lactamase Producer (ESBL).  In bloodstream infections from ESBL organisms, carbapenems are preferred over piperacillin/tazobactam. They are shown to have a lower risk of mortality.    Report Status 03/22/2022 FINAL  Final   Organism ID, Bacteria ESCHERICHIA COLI  Final  Susceptibility   Escherichia coli - MIC*    AMPICILLIN >=32 RESISTANT Resistant     CEFAZOLIN >=64 RESISTANT Resistant     CEFEPIME 1 SENSITIVE Sensitive     CEFTAZIDIME RESISTANT Resistant     CEFTRIAXONE 32 RESISTANT Resistant     CIPROFLOXACIN >=4 RESISTANT Resistant     GENTAMICIN <=1 SENSITIVE Sensitive     IMIPENEM <=0.25 SENSITIVE Sensitive     TRIMETH/SULFA >=320 RESISTANT Resistant     AMPICILLIN/SULBACTAM >=32 RESISTANT Resistant     PIP/TAZO 64 INTERMEDIATE Intermediate     * ESCHERICHIA COLI  Blood Culture ID Panel (Reflexed)     Status: Abnormal   Collection Time: 03/19/22  4:13 PM  Result Value Ref Range Status   Enterococcus faecalis NOT DETECTED NOT DETECTED Final   Enterococcus Faecium NOT DETECTED NOT DETECTED Final   Listeria monocytogenes NOT DETECTED NOT DETECTED Final   Staphylococcus species NOT DETECTED NOT DETECTED Final   Staphylococcus aureus (BCID) NOT DETECTED NOT DETECTED Final   Staphylococcus epidermidis NOT DETECTED NOT DETECTED Final   Staphylococcus lugdunensis NOT DETECTED NOT DETECTED Final   Streptococcus species NOT DETECTED NOT DETECTED Final   Streptococcus agalactiae NOT DETECTED NOT DETECTED Final   Streptococcus pneumoniae NOT DETECTED NOT DETECTED  Final   Streptococcus pyogenes NOT DETECTED NOT DETECTED Final   A.calcoaceticus-baumannii NOT DETECTED NOT DETECTED Final   Bacteroides fragilis NOT DETECTED NOT DETECTED Final   Enterobacterales DETECTED (A) NOT DETECTED Final    Comment: Enterobacterales represent a large order of gram negative bacteria, not a single organism. CRITICAL RESULT CALLED TO, READ BACK BY AND VERIFIED WITH: PHARMD E. Daphine DeutscherMARTIN [phone] @0815  FH    Enterobacter cloacae complex NOT DETECTED NOT DETECTED Final   Escherichia coli DETECTED (A) NOT DETECTED Final    Comment: CRITICAL RESULT CALLED TO, READ BACK BY AND VERIFIED WITH: PHARMD E. Daphine DeutscherMARTIN [phone] @0815  FH    Klebsiella aerogenes NOT DETECTED NOT DETECTED Final   Klebsiella oxytoca NOT DETECTED NOT DETECTED Final   Klebsiella pneumoniae NOT DETECTED NOT DETECTED Final   Proteus species NOT DETECTED NOT DETECTED Final   Salmonella species NOT DETECTED NOT DETECTED Final   Serratia marcescens NOT DETECTED NOT DETECTED Final   Haemophilus influenzae NOT DETECTED NOT DETECTED Final   Neisseria meningitidis NOT DETECTED NOT DETECTED Final   Pseudomonas aeruginosa NOT DETECTED NOT DETECTED Final   Stenotrophomonas maltophilia NOT DETECTED NOT DETECTED Final   Candida albicans NOT DETECTED NOT DETECTED Final   Candida auris NOT DETECTED NOT DETECTED Final   Candida glabrata NOT DETECTED NOT DETECTED Final   Candida krusei NOT DETECTED NOT DETECTED Final   Candida parapsilosis NOT DETECTED NOT DETECTED Final   Candida tropicalis NOT DETECTED NOT DETECTED Final   Cryptococcus neoformans/gattii NOT DETECTED NOT DETECTED Final   CTX-M ESBL DETECTED (A) NOT DETECTED Final    Comment: CRITICAL RESULT CALLED TO, READ BACK BY AND VERIFIED WITH: PHARMD EDaphine Deutscher. MARTIN [phone] @0815  FH (NOTE) Extended spectrum beta-lactamase detected. Recommend a carbapenem as initial therapy.      Carbapenem resistance IMP NOT DETECTED NOT DETECTED Final   Carbapenem resistance KPC NOT  DETECTED NOT DETECTED Final   Carbapenem resistance NDM NOT DETECTED NOT DETECTED Final   Carbapenem resist OXA 48 LIKE NOT DETECTED NOT DETECTED Final   Carbapenem resistance VIM NOT DETECTED NOT DETECTED Final    Comment: Performed at [facility], [address] [place], [place] [address]  SARS Coronavirus 2 by RT PCR (hospital  order, performed in [facility] hospital lab) *cepheid single result test* Anterior Nasal Swab     Status: None   Collection Time: 03/19/22  4:28 PM   Specimen: Anterior Nasal Swab  Result Value Ref Range Status   SARS Coronavirus 2 by RT PCR NEGATIVE NEGATIVE Final    Comment: (NOTE) SARS-CoV-2 target nucleic acids are NOT DETECTED.  The SARS-CoV-2 RNA is generally detectable in upper and lower respiratory specimens during the acute phase of infection. The lowest concentration of SARS-CoV-2 viral copies this assay can detect is 250 copies / mL. A negative result does not preclude SARS-CoV-2 infection and should not be used as the sole basis for treatment or other patient management decisions.  A negative result may occur with improper specimen collection / handling, submission of specimen other than nasopharyngeal swab, presence of viral mutation(s) within the areas targeted by this assay, and inadequate number of viral copies (<250 copies / mL). A negative result must be combined with clinical observations, patient history, and epidemiological information.  Fact Sheet for Patients:   RoadLapTop.co.za  Fact Sheet for Healthcare Providers: http://kim-miller.com/  This test is not yet approved or  cleared by the [place] FDA and has been authorized for detection and/or diagnosis of SARS-CoV-2 by FDA under an Emergency Use Authorization (EUA).  This EUA will remain in effect (meaning this test can be used) for the duration of the COVID-19 declaration under Section 564(b)(1) of the Act, 21  U.S.C. section 360bbb-3(b)(1), unless the authorization is terminated or revoked sooner.  Performed at [facility], [address], [place], [place] [address]   Blood Culture (routine x 2)     Status: Abnormal   Collection Time: 03/19/22  5:59 PM   Specimen: BLOOD  Result Value Ref Range Status   Specimen Description BLOOD SITE NOT SPECIFIED  Final   Special Requests   Final    BOTTLES DRAWN AEROBIC AND ANAEROBIC Blood Culture adequate volume   Culture  Setup Time   Final    GRAM NEGATIVE RODS IN BOTH AEROBIC AND ANAEROBIC BOTTLES CRITICAL VALUE NOTED.  VALUE IS CONSISTENT WITH PREVIOUSLY REPORTED AND CALLED VALUE. CRITICAL RESULT CALLED TO, READ BACK BY AND VERIFIED WITH: PHARMD E. Daphine Deutscher [id number] @0815  FH    Culture (A)  Final    ESCHERICHIA COLI SUSCEPTIBILITIES PERFORMED ON PREVIOUS CULTURE WITHIN THE LAST 5 DAYS. Performed at [facility], [address] [place], [place]    Report Status 03/22/2022 FINAL  Final  Culture, blood (Routine X 2) w Reflex to ID Panel     Status: None   Collection Time: 03/21/22 12:33 AM   Specimen: BLOOD LEFT ARM  Result Value Ref Range Status   Specimen Description BLOOD LEFT ARM  Final   Special Requests   Final    BOTTLES DRAWN AEROBIC ONLY Blood Culture results may not be optimal due to an inadequate volume of blood received in culture bottles   Culture   Final    NO GROWTH 5 DAYS Performed at [facility], [address] [place], [place]    Report Status 03/26/2022 FINAL  Final         Radiology Studies: No results found.      Scheduled Meds:  atorvastatin  20 mg Oral Q1200   Chlorhexidine Gluconate Cloth  6 each Topical Daily   enoxaparin (LOVENOX) injection  40 mg Subcutaneous Q24H   ferrous sulfate  325 mg Oral Daily  insulin aspart  0-9 Units Subcutaneous TID WC   insulin glargine-yfgn  15 Units Subcutaneous QHS   irbesartan  75 mg Oral Daily   linagliptin  5 mg Oral Daily    sodium chloride flush  10-40 mL Intracatheter Q12H   Continuous Infusions:  meropenem (MERREM) IV 1 g (03/28/22 0922)     LOS: 9 days      Huey Bienenstock, MD [facility]   To contact the attending provider between 7A-7P or the covering provider during after hours 7P-7A, please log into the web site [email or web address] and access using universal [place] password for that web site. If you do not have the password, please call the hospital operator.  03/28/2022, 3:11 PM

## 2022-03-28 NOTE — Plan of Care (Signed)
  Problem: Education: Goal: Ability to describe self-care measures that may prevent or decrease complications (Diabetes Survival Skills Education) will improve Outcome: Progressing   Problem: Coping: Goal: Ability to adjust to condition or change in health will improve Outcome: Progressing   Problem: Metabolic: Goal: Ability to maintain appropriate glucose levels will improve Outcome: Progressing   Problem: Nutritional: Goal: Maintenance of adequate nutrition will improve Outcome: Progressing   Problem: Skin Integrity: Goal: Risk for impaired skin integrity will decrease Outcome: Progressing   Problem: Tissue Perfusion: Goal: Adequacy of tissue perfusion will improve Outcome: Progressing   

## 2022-03-29 ENCOUNTER — Encounter (HOSPITAL_COMMUNITY): Payer: Self-pay | Admitting: Cardiovascular Disease

## 2022-03-29 LAB — GLUCOSE, CAPILLARY
Glucose-Capillary: 161 mg/dL — ABNORMAL HIGH (ref 70–99)
Glucose-Capillary: 234 mg/dL — ABNORMAL HIGH (ref 70–99)
Glucose-Capillary: 191 mg/dL — ABNORMAL HIGH (ref 70–99)
Glucose-Capillary: 177 mg/dL — ABNORMAL HIGH (ref 70–99)

## 2022-03-29 NOTE — Progress Notes (Signed)
PROGRESS NOTE    John Meza  EXH:[id number] DOB: March 14, 1961 DOA: 03/19/2022 PCP: Storm Frisk, MD    Chief Complaint  Patient presents with   Code Sepsis    Brief Narrative:   John Meza is a 61 y.o. male with history of diabetes mellitus type 2, hypertension, hyperlipidemia, chronic anemia, paraplegia with neurogenic bladder uses self cath was recently admitted for ESBL E. coli bacteremia was recently on IV antibiotics.  He presents to ER  hypotensive with systolic blood pressure in the 80s with fever leukocytosis concerning for sepsis.  -His work-up significant for recurrent bacteremia, thought to be secondary to urinary source, as well MRI spine significant for myositis, TEE is negative for endocarditis.    Assessment & Plan:   Active Problems:   HLD (hyperlipidemia)   Paraplegia (HCC)   Neurogenic bladder   Insulin dependent type 2 diabetes mellitus, controlled (HCC)   Essential hypertension   Lower urinary tract infectious disease   Sepsis (HCC)  Severe sepsis secondary to recurrent ESBL bacteremia , and myositis - MRI of spine showing paraspinal myositis,(no discitis or epidural abscess). -ID input greatly appreciated, for now continue with IV meropenem.   will DC on IV ertapenem for total of 4 weeks treatment, with end date 04/16/2022 -2D echo with no evidence of vegetation, TEE with no evidence of endocarditis - previous MD D/W Dr. Benancio Deeds 6/9 --recommends treatment of urinary infection nonemergent cystoscopy in office and outpatient follow-up with Dr. Berniece Salines of urology as an outpatient-he will set this up at this follow-up and family is aware to call and confirm -PICC line inserted 6/15. -Discussed with patient/wife with telemetry interpreter, that patient cannot  REuse his in and out catheters, and they are meant for single use, as reusing may be causing his infection.  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 - Routine management and follow-up   Incidental finding of right hip fracture on imaging upon admission -Input greatly appreciated, not a surgical candidate given he is paraplegic and wheelchair dependent at baseline and insensate and right hip  Diabetes mellitus, type II -BGs are controlled on current dose of Semglee and insulin sliding scale -Metformin remains on hold -back on Sitagliptin     DVT prophylaxis: Lovenox Code Status: Full Family Communication: Wife updated at bedside, teleinterpreter was used  Disposition:   Status is: Inpatient, patient is medically stable for discharge, but awaiting arrangement for IV antibiotics on discharge via PICC line.    Consultants:  Alice Rieger ID    Subjective:  No significant events overnight, he denies any complaints.  Objective: Vitals:   03/28/22 1200 03/28/22 1600 03/28/22 2000 03/29/22 0657  BP: 134/72 126/79 123/73 116/68  Pulse: 80 100 88 100  Resp: 16 19 19 12   Temp:   98.2 F (36.8 C) 98.8 F (37.1 C)  TempSrc:   Oral Oral  SpO2:   98% 100%  Weight:      Height:        Intake/Output Summary (Last 24 hours) at 03/29/2022 1305 Last data filed at 03/29/2022 0100 Gross per 24 hour  Intake 691.18 ml  Output --  Net 691.18 ml   Filed Weights   03/20/22 0700  Weight:  87 kg    Examination:  Awake Alert, Oriented X 3, No new F.N deficits, Normal affect Symmetrical Chest wall movement, Good air movement bilaterally, CTAB RRR,No Gallops,Rubs or new Murmurs, No Parasternal Heave +ve B.Sounds, Abd Soft, No tenderness, No rebound - guarding or rigidity. No Cyanosis, Clubbing or edema, No new Rash or bruise            Data Reviewed:  I have personally reviewed following labs and imaging studies  CBC: Recent Labs  Lab 03/23/22 0828 03/24/22 0147 03/25/22 0008 03/26/22 0124  WBC 7.5 9.8 7.8 7.3  NEUTROABS 5.3  --  5.6  --   HGB 7.3* 8.4* 7.6* 7.5*  HCT 22.5* 24.8* 22.9* 23.4*  MCV 84.6 84.1 83.3 83.9  PLT 279 301 326 352    Basic Metabolic Panel: Recent Labs  Lab 03/23/22 0828 03/24/22 0147 03/25/22 0008 03/26/22 0124  NA 136 133* 135 135  K 3.7 3.7 4.0 4.0  CL 107 101 103 102  CO2 23 24 24 24   GLUCOSE 136* 151* 151* 138*  BUN 6 8 8 8   CREATININE 0.73 0.87 0.74 0.74  CALCIUM 8.2* 8.3* 8.0* 8.4*    GFR: Estimated Creatinine Clearance: 100.1 mL/min (by C-G formula based on SCr of 0.74 mg/dL).  Liver Function Tests: Recent Labs  Lab 03/23/22 0828 03/25/22 0008  AST 34 29  ALT 29 28  ALKPHOS 146* 137*  BILITOT 0.4 0.4  PROT 5.4* 5.5*  ALBUMIN 1.6* 1.6*    CBG: Recent Labs  Lab 03/28/22 1150 03/28/22 1549 03/28/22 2156 03/29/22 0904 03/29/22 1207  GLUCAP 153* 191* 219* 161* 234*     Recent Results (from the past 240 hour(s))  Urine Culture     Status: None   Collection Time: 03/19/22  2:15 PM   Specimen: Urine   UR  Result Value Ref Range Status   Urine Culture, Routine Final report  Final   Organism ID, Bacteria Comment  Final    Comment: Mixed urogenital flora 25,000-50,000 colony forming units per mL   Urine Culture     Status: Abnormal   Collection Time: 03/19/22  4:04 PM   Specimen: In/Out Cath Urine  Result Value Ref Range Status   Specimen Description IN/OUT CATH URINE  Final   Special Requests   Final    NONE Performed at [facility], [address] [place], [address] [place]    Culture >=100,000 COLONIES/mL YEAST (A)  Final   Report Status 03/22/2022 FINAL  Final  Blood Culture (routine x 2)     Status: Abnormal   Collection Time: 03/19/22  4:13 PM   Specimen: BLOOD LEFT FOREARM  Result Value Ref Range Status   Specimen Description BLOOD LEFT  FOREARM  Final   Special Requests   Final    BOTTLES DRAWN AEROBIC AND ANAEROBIC Blood Culture adequate volume   Culture  Setup Time   Final    GRAM NEGATIVE RODS ANAEROBIC BOTTLE ONLY CRITICAL RESULT CALLED TO, READ BACK BY AND VERIFIED WITH: 05/22/2022 05/19/22 @0813  FH Performed at [facility], [address], [place], [facility] [address]    Culture (A)  Final    ESCHERICHIA COLI Confirmed Extended Spectrum Beta-Lactamase Producer (ESBL).  In bloodstream infections from ESBL organisms, carbapenems are preferred over piperacillin/tazobactam. They are shown to have a lower risk of mortality.    Report Status 03/22/2022 FINAL  Final   Organism ID, Bacteria ESCHERICHIA COLI  Final  Susceptibility   Escherichia coli - MIC*    AMPICILLIN >=32 RESISTANT Resistant     CEFAZOLIN >=64 RESISTANT Resistant     CEFEPIME 1 SENSITIVE Sensitive     CEFTAZIDIME RESISTANT Resistant     CEFTRIAXONE 32 RESISTANT Resistant     CIPROFLOXACIN >=4 RESISTANT Resistant     GENTAMICIN <=1 SENSITIVE Sensitive     IMIPENEM <=0.25 SENSITIVE Sensitive     TRIMETH/SULFA >=320 RESISTANT Resistant     AMPICILLIN/SULBACTAM >=32 RESISTANT Resistant     PIP/TAZO 64 INTERMEDIATE Intermediate     * ESCHERICHIA COLI  Blood Culture ID Panel (Reflexed)     Status: Abnormal   Collection Time: 03/19/22  4:13 PM  Result Value Ref Range Status   Enterococcus faecalis NOT DETECTED NOT DETECTED Final   Enterococcus Faecium NOT DETECTED NOT DETECTED Final   Listeria monocytogenes NOT DETECTED NOT DETECTED Final   Staphylococcus species NOT DETECTED NOT DETECTED Final   Staphylococcus aureus (BCID) NOT DETECTED NOT DETECTED Final   Staphylococcus epidermidis NOT DETECTED NOT DETECTED Final   Staphylococcus lugdunensis NOT DETECTED NOT DETECTED Final   Streptococcus species NOT DETECTED NOT DETECTED Final   Streptococcus agalactiae NOT DETECTED NOT DETECTED Final   Streptococcus pneumoniae NOT DETECTED NOT  DETECTED Final   Streptococcus pyogenes NOT DETECTED NOT DETECTED Final   A.calcoaceticus-baumannii NOT DETECTED NOT DETECTED Final   Bacteroides fragilis NOT DETECTED NOT DETECTED Final   Enterobacterales DETECTED (A) NOT DETECTED Final    Comment: Enterobacterales represent a large order of gram negative bacteria, not a single organism. CRITICAL RESULT CALLED TO, READ BACK BY AND VERIFIED WITH: PHARMD E. Daphine Deutscher [id number] @0815  FH    Enterobacter cloacae complex NOT DETECTED NOT DETECTED Final   Escherichia coli DETECTED (A) NOT DETECTED Final    Comment: CRITICAL RESULT CALLED TO, READ BACK BY AND VERIFIED WITH: PHARMD E Daphine Deutscher @0815  FH    Klebsiella aerogenes NOT DETECTED NOT DETECTED Final   Klebsiella oxytoca NOT DETECTED NOT DETECTED Final   Klebsiella pneumoniae NOT DETECTED NOT DETECTED Final   Proteus species NOT DETECTED NOT DETECTED Final   Salmonella species NOT DETECTED NOT DETECTED Final   Serratia marcescens NOT DETECTED NOT DETECTED Final   Haemophilus influenzae NOT DETECTED NOT DETECTED Final   Neisseria meningitidis NOT DETECTED NOT DETECTED Final   Pseudomonas aeruginosa NOT DETECTED NOT DETECTED Final   Stenotrophomonas maltophilia NOT DETECTED NOT DETECTED Final   Candida albicans NOT DETECTED NOT DETECTED Final   Candida auris NOT DETECTED NOT DETECTED Final   Candida glabrata NOT DETECTED NOT DETECTED Final   Candida krusei NOT DETECTED NOT DETECTED Final   Candida parapsilosis NOT DETECTED NOT DETECTED Final   Candida tropicalis NOT DETECTED NOT DETECTED Final   Cryptococcus neoformans/gattii NOT DETECTED NOT DETECTED Final   CTX-M ESBL DETECTED (A) NOT DETECTED Final    Comment: CRITICAL RESULT CALLED TO, READ BACK BY AND VERIFIED WITH: PHARMD [id number] @0815  FH (NOTE) Extended spectrum beta-lactamase detected. Recommend a carbapenem as initial therapy.      Carbapenem resistance IMP NOT DETECTED NOT DETECTED Final   Carbapenem resistance  KPC NOT DETECTED NOT DETECTED Final   Carbapenem resistance NDM NOT DETECTED NOT DETECTED Final   Carbapenem resist OXA 48 LIKE NOT DETECTED NOT DETECTED Final   Carbapenem resistance VIM NOT DETECTED NOT DETECTED Final    Comment: Performed at [facility], [address], [place], [facility] [address]  SARS Coronavirus 2 by RT PCR (hospital  order, performed in [facility] hospital lab) *cepheid single result test* Anterior Nasal Swab     Status: None   Collection Time: 03/19/22  4:28 PM   Specimen: Anterior Nasal Swab  Result Value Ref Range Status   SARS Coronavirus 2 by RT PCR NEGATIVE NEGATIVE Final    Comment: (NOTE) SARS-CoV-2 target nucleic acids are NOT DETECTED.  The SARS-CoV-2 RNA is generally detectable in upper and lower respiratory specimens during the acute phase of infection. The lowest concentration of SARS-CoV-2 viral copies this assay can detect is 250 copies / mL. A negative result does not preclude SARS-CoV-2 infection and should not be used as the sole basis for treatment or other patient management decisions.  A negative result may occur with improper specimen collection / handling, submission of specimen other than nasopharyngeal swab, presence of viral mutation(s) within the areas targeted by this assay, and inadequate number of viral copies (<250 copies / mL). A negative result must be combined with clinical observations, patient history, and epidemiological information.  Fact Sheet for Patients:   RoadLapTop.co.za  Fact Sheet for Healthcare Providers: http://kim-miller.com/  This test is not yet approved or  cleared by the [place] FDA and has been authorized for detection and/or diagnosis of SARS-CoV-2 by FDA under an Emergency Use Authorization (EUA).  This EUA will remain in effect (meaning this test can be used) for the duration of the COVID-19 declaration under Section 564(b)(1) of the Act, 21  U.S.C. section 360bbb-3(b)(1), unless the authorization is terminated or revoked sooner.  Performed at [facility], [address], [place], [place] [address]   Blood Culture (routine x 2)     Status: Abnormal   Collection Time: 03/19/22  5:59 PM   Specimen: BLOOD  Result Value Ref Range Status   Specimen Description BLOOD SITE NOT SPECIFIED  Final   Special Requests   Final    BOTTLES DRAWN AEROBIC AND ANAEROBIC Blood Culture adequate volume   Culture  Setup Time   Final    GRAM NEGATIVE RODS IN BOTH AEROBIC AND ANAEROBIC BOTTLES CRITICAL VALUE NOTED.  VALUE IS CONSISTENT WITH PREVIOUSLY REPORTED AND CALLED VALUE. CRITICAL RESULT CALLED TO, READ BACK BY AND VERIFIED WITH: PHARMD E. Daphine Deutscher [id number] @0815  FH    Culture (A)  Final    ESCHERICHIA COLI SUSCEPTIBILITIES PERFORMED ON PREVIOUS CULTURE WITHIN THE LAST 5 DAYS. Performed at [facility], [address] [place], [place]    Report Status 03/22/2022 FINAL  Final  Culture, blood (Routine X 2) w Reflex to ID Panel     Status: None   Collection Time: 03/21/22 12:33 AM   Specimen: BLOOD LEFT ARM  Result Value Ref Range Status   Specimen Description BLOOD LEFT ARM  Final   Special Requests   Final    BOTTLES DRAWN AEROBIC ONLY Blood Culture results may not be optimal due to an inadequate volume of blood received in culture bottles   Culture   Final    NO GROWTH 5 DAYS Performed at [facility], [address] [place], [place]    Report Status 03/26/2022 FINAL  Final         Radiology Studies: No results found.      Scheduled Meds:  atorvastatin  20 mg Oral Q1200   Chlorhexidine Gluconate Cloth  6 each Topical Daily   enoxaparin (LOVENOX) injection  40 mg Subcutaneous Q24H   ferrous sulfate  325 mg Oral Daily  insulin aspart  0-9 Units Subcutaneous TID WC   insulin glargine-yfgn  15 Units Subcutaneous QHS   irbesartan  75 mg Oral Daily   linagliptin  5 mg Oral Daily    sodium chloride flush  10-40 mL Intracatheter Q12H   Continuous Infusions:  meropenem (MERREM) IV 1 g (03/29/22 0921)     LOS: 10 days      Huey Bienenstock, MD [facility]   To contact the attending provider between 7A-7P or the covering provider during after hours 7P-7A, please log into the web site [email or web address] and access using universal [place] password for that web site. If you do not have the password, please call the hospital operator.  03/29/2022, 1:05 PM

## 2022-03-30 LAB — GLUCOSE, CAPILLARY
Glucose-Capillary: 157 mg/dL — ABNORMAL HIGH (ref 70–99)
Glucose-Capillary: 172 mg/dL — ABNORMAL HIGH (ref 70–99)

## 2022-03-30 MED ORDER — MEROPENEM IV (FOR PTA / DISCHARGE USE ONLY): 1.0000 g | Freq: Three times a day (TID) | INTRAVENOUS | 0 refills | Status: AC

## 2022-03-30 NOTE — Telephone Encounter (Signed)
Message received from Tomi Bamberger, RN CM stating that the patient's wife now remembers receiving the package last week.   I called [employer] and spoke to Marty Heck was not available, and requested the replacement shipment be cancelled.  She said that the request had already been placed and she is not sure that they will be able to cancel it.

## 2022-03-30 NOTE — Discharge Summary (Signed)
Physician Discharge Summary  John Meza AQT:[id number] DOB: 03-03-1961 DOA: 03/19/2022  PCP: John Stain, MD  Admit date: 03/19/2022 Discharge date: 03/30/2022  Admitted From: Home Disposition:  Home   Recommendations for Outpatient Follow-up:  Follow up with PCP in 1-2 weeks Follow-up with ID as an outpatient      Infectious disease recommendation for antibiotics: Discharge antibiotics to be given via PICC line Discharge antibiotics: Per pharmacy protocol meropene Duration: 4 wk End Date: 04/16/22 [facility] Care Per Protocol: Home health RN for IV administration and teaching; PICC line care and labs.   Labs weekly while on IV antibiotics: _x_ CBC with differential __x BMP _x_ Please pull PIC at completion of IV antibiotics     Fax weekly labs to [phone]   Clinic Follow Up Appt: 04-21-22 10:00 with Dr [name]     @ [facility]     Discharge Condition:Stable CODE STATUS:FULL Diet recommendation: Heart Healthy / Carb Modified   Brief/Interim Summary: John Meza is a 61 y.o. male with history of diabetes mellitus type 2, hypertension, hyperlipidemia, chronic anemia, paraplegia with neurogenic bladder uses self cath was recently admitted for ESBL E. coli bacteremia was recently on IV antibiotics.  He presents to ER  hypotensive with systolic blood pressure in the 80s with fever leukocytosis concerning for sepsis.  -His work-up significant for recurrent bacteremia, thought to be secondary to urinary source, as well MRI spine significant for myositis, TEE is negative for endocarditis.  Severe sepsis secondary to recurrent ESBL bacteremia , and myositis - MRI of spine showing paraspinal myositis,(no discitis or epidural abscess). -ID input greatly appreciated, for now continue with IV meropenem.   will DC on IV meropenem for total of 4 weeks treatment, with end date 04/16/2022 -2D echo with no evidence of vegetation, TEE with no evidence of  endocarditis - previous MD D/W Dr. [name] 6/9 --recommends treatment of urinary infection nonemergent cystoscopy in office and outpatient follow-up with Dr. [name] of urology as an outpatient-he will set this up at this follow-up and family is aware to call and confirm -PICC line inserted 6/15. -Discussed with patient/wife with telemetry interpreter, that patient cannot  REuse his in and out catheters, and they are meant for single use, as reusing may be causing his infection.   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 - Routine management and follow-up    Incidental finding of right hip fracture on imaging upon admission -Input greatly appreciated, not a surgical candidate given he is paraplegic and wheelchair dependent at baseline and insensate and right hip   Diabetes mellitus, type II -Resume home regimen        Discharge Diagnoses:  Active Problems:   HLD (hyperlipidemia)   Paraplegia (HCC)   Neurogenic bladder   Insulin dependent type 2 diabetes mellitus, controlled ([place])   Essential hypertension   Lower urinary tract infectious disease   Sepsis [facility])    Discharge Instructions  Discharge Instructions     [facility] pharmacist to adjust dose for Vancomycin, Aminoglycosides and other anti-infective therapies as requested by physician.   Complete by: As directed    [facility] to provide Cath Flo 75m   Complete by: As directed    Administer for PICC line occlusion and as ordered by physician  for other access device issues.   Anaphylaxis Kit: Provided to treat any anaphylactic reaction to the medication being provided to the patient if First Dose or when requested by physician   Complete by: As directed    Epinephrine 8m/ml vial / amp:  Administer 0.336m(0.33m76msubcutaneously once for moderate to severe anaphylaxis, nurse to call physician and pharmacy when reaction occurs and call 911 if needed for immediate care   Diphenhydramine 50m48m IV vial: Administer 25-50mg24mIM PRN for first dose reaction, rash, itching, mild reaction, nurse to call physician and pharmacy when reaction occurs   Sodium Chloride 0.9% NS 500ml 65mAdminister if needed for hypovolemic blood pressure drop or as ordered by physician after call to physician with anaphylactic reaction   Change dressing on IV access line weekly and PRN   Complete by: As directed    Diet - low sodium heart healthy   Complete by: As directed    Discharge instructions   Complete by: As directed    Follow with Primary MD John Meza 7 days   Get CBC, CMP,  checked  by Primary MD next visit.  -Please do not  reuse your in and out catheters   Activity: As tolerated with Full fall precautions use walker/cane & assistance as needed   Disposition Home    Diet: Heart Healthy/carb modified  On your next visit with your primary care physician please Get Medicines reviewed and adjusted.   Please request your Prim.MD to go over all Hospital Tests and Procedure/Radiological results at the follow up, please get all Hospital records sent to your Prim MD by signing hospital release before you go home.   If you experience worsening of your admission symptoms, develop shortness of breath, life threatening emergency, suicidal or homicidal thoughts you must seek medical attention immediately by calling 911 or calling your MD immediately  if symptoms less severe.  You Must read complete instructions/literature along with all the possible adverse reactions/side effects for all the Medicines you take and that have been prescribed to you. Take any new Medicines after you have completely understood and accpet all the possible adverse reactions/side effects.   Do not drive,  operating heavy machinery, perform activities at heights, swimming or participation in water activities or provide baby sitting services if your were admitted for syncope or siezures until you have seen by Primary MD or a Neurologist and advised to do so again.  Do not drive when taking Pain medications.    Do not take more than prescribed Pain, Sleep and Anxiety Medications  Special Instructions: If you have smoked or chewed Tobacco  in the last 2 yrs please stop smoking, stop any regular Alcohol  and or any Recreational drug use.  Wear Seat belts while driving.   Please note  You were cared for by a hospitalist during your hospital stay. If you have any questions about your discharge medications or the care you received while you were in the hospital after you are discharged, you can call the unit and asked to speak with the hospitalist on call if the hospitalist that took care of you is not available. Once you are discharged, your primary care physician will handle any further medical issues. Please note that NO REFILLS for any discharge medications will be authorized once you are discharged, as it is imperative that you return to your primary care physician (or establish a relationship with a primary care physician if you do not have one)  for your aftercare needs so that they can reassess your need for medications and monitor your lab values.   Flush IV access with Sodium Chloride 0.9% and Heparin 10 units/ml or 100 units/ml   Complete by: As directed    Flush IV access with Sodium Chloride 0.9% and Heparin 10 units/ml or 100 units/ml   Complete by: As directed    Home infusion instructions - [facility]   Complete by: As directed    Instructions: Flush IV access with Sodium Chloride 0.9% and Heparin 10units/ml or 100units/ml   Change dressing on IV access line: Weekly and PRN   Instructions Cath Flo 44m: Administer for PICC Line occlusion and as ordered by physician for other  access device   [facility] pharmacist to adjust dose for: Vancomycin, Aminoglycosides and other anti-infective therapies as requested by physician   Increase activity slowly   Complete by: As directed    Method of administration may be changed at the discretion of home infusion pharmacist based upon assessment of the patient and/or caregiver's ability to self-administer the medication ordered   Complete by: As directed    No wound care   Complete by: As directed       Allergies as of 03/30/2022       Reactions   Macrobid [nitrofurantoin] Itching, Rash        Medication List     TAKE these medications    atorvastatin 20 MG tablet Commonly known as: LIPITOR Take 1 tablet (20 mg total) by mouth daily at 12 noon.   Basaglar KwikPen 100 UNIT/ML Inject 25 Units into the skin at bedtime.   FeroSul 325 (65 FE) MG tablet Generic drug: ferrous sulfate Tome 1 tableta (325 mg en total) por va oral diariamente. (Take 1 tablet (325 mg total) by mouth daily.)   meropenem  IVPB Commonly known as: MERREM Inject 1 g into the vein every 8 (eight) hours for 17 days. Indication:  Recurrent ESBL bacteremia/myositis First Dose: Yes Last Day of Therapy:  04/16/22 Labs - Once weekly:  CBC/D and BMP, Labs - Every other week:  ESR and CRP Method of administration: Mini-Bag Plus / Gravity Method of administration may be changed at the discretion of home infusion pharmacist based upon assessment of the patient and/or caregiver's ability to self-administer the medication ordered.   metFORMIN 1000 MG tablet Commonly known as: GLUCOPHAGE TAKE 1 TABLET (1,000 MG TOTAL) BY MOUTH 2 (TWO) TIMES DAILY WITH A MEAL. What changed: how much to take   methocarbamol 500 MG tablet Commonly known as: ROBAXIN Take 1 tablet (500 mg total) by mouth every 6 (six) hours as needed for muscle spasms.   omeprazole 20 MG capsule Commonly known as: PRILOSEC Take 1 capsule by mouth daily to reduce stomach  acid What changed:  when to take this reasons to take this   sitaGLIPtin 100 MG tablet Commonly known as: Januvia Take 1 tablet by mouth daily What changed:  how much to take how to take this when to take this   TechLite Pen Needles 32G X 4 MM Misc Generic drug: Insulin Pen Needle Use as directed to inject insulin   valsartan-hydrochlorothiazide 320-25 MG tablet Commonly known as: DIOVAN-HCT Tome 1 tableta por va oral diariamente. (Take 1 tablet by mouth daily.)               Discharge Care Instructions  (From admission, onward)           Start  Ordered   03/27/22 0000  Change dressing on IV access line weekly and PRN  (Home infusion instructions - [facility] )        03/27/22 1112            Follow-up Information     [facility] Follow up.   Why: IV Antibiotics.        [facility] Follow up.   Why: Home Health Registered Nurse-office to call for visit times. Contact information: [address] [place], [place], [address] [phone]        Ardis Hughs, MD Follow up.   Specialty: Urology Contact information: [place] [address] [phone]         Rosiland Oz, MD Follow up on 04/21/2022.   Specialty: Infectious Diseases Why: At 10 AM Contact information: [employer] [place] [address] [phone]                Allergies  Allergen Reactions   Macrobid [Nitrofurantoin] Itching and Rash    Consultations: ID Orthopedic   Procedures/Studies: ECHO TEE  Result Date: 03/26/2022    TRANSESOPHOGEAL ECHO REPORT   Patient Name:   LEJUAN BOTTO Date of Exam: 03/26/2022 Medical Rec #:  [id number]                  Height:       64.0 in Accession #:    [id number]                 Weight:       191.8 lb Date of Birth:  [birth year]                  BSA:          1.922 m Patient Age:    61 years                   BP:           134/64 mmHg  Patient Gender: M                          HR:           105 bpm. Exam Location:  Inpatient Procedure: Transesophageal Echo and Color Doppler Indications:     Bacteremia  History:         Patient has prior history of Echocardiogram examinations, most                  recent 03/24/2022. Signs/Symptoms:Bacteremia; Risk                  Factors:Hypertension and Dyslipidemia.  Sonographer:     Roseanna Rainbow RDCS Referring Phys:  [facility] Diagnosing Phys: Mertie Moores MD PROCEDURE: After discussion of the risks and benefits of a TEE, an informed consent was obtained from the patient. The transesophogeal probe was passed without difficulty through the esophogus of the patient. Imaged were obtained with the patient in a left lateral decubitus position. Sedation performed by different physician. The patient was monitored while under deep sedation. Anesthestetic sedation was provided intravenously by Anesthesiology: 337m of Propofol, 1043mof Lidocaine. The patient's vital signs; including heart rate, blood pressure, and oxygen saturation; remained stable throughout the procedure. The patient developed no complications during the procedure. IMPRESSIONS  1. Left ventricular ejection fraction, by estimation, is 65 to 70%. The left ventricle  has normal function.  2. Right ventricular systolic function is normal. The right ventricular size is normal.  3. No left atrial/left atrial appendage thrombus was detected.  4. The mitral valve is normal in structure. Mild mitral valve regurgitation. No evidence of mitral stenosis.  5. The aortic valve is normal in structure. Aortic valve regurgitation is trivial. FINDINGS  Left Ventricle: Left ventricular ejection fraction, by estimation, is 65 to 70%. The left ventricle has normal function. The left ventricular internal cavity size was normal in size. Right Ventricle: The right ventricular size is normal. Right vetricular wall thickness was not well visualized. Right  ventricular systolic function is normal. Left Atrium: Left atrial size was normal in size. No left atrial/left atrial appendage thrombus was detected. Right Atrium: Right atrial size was normal in size. Pericardium: There is no evidence of pericardial effusion. Mitral Valve: The mitral valve is normal in structure. Mild mitral valve regurgitation. No evidence of mitral valve stenosis. There is no evidence of mitral valve vegetation. Tricuspid Valve: The tricuspid valve is normal in structure. Tricuspid valve regurgitation is trivial. There is no evidence of tricuspid valve vegetation. Aortic Valve: The aortic valve is normal in structure. Aortic valve regurgitation is trivial. Pulmonic Valve: The pulmonic valve was normal in structure. Pulmonic valve regurgitation is trivial. There is no evidence of pulmonic valve vegetation. Aorta: The aortic root and ascending aorta are structurally normal, with no evidence of dilitation. IAS/Shunts: No atrial level shunt detected by color flow Doppler. Mertie Moores MD Electronically signed by Mertie Moores MD Signature Date/Time: 03/26/2022/5:29:32 PM    Final    [place] EKG SITE RITE  Result Date: 03/25/2022 If Site Rite image not attached, placement could not be confirmed due to current cardiac rhythm.  ECHOCARDIOGRAM COMPLETE  Result Date: 03/24/2022    ECHOCARDIOGRAM REPORT   Patient Name:   MARCELL CHAVARIN Date of Exam: 03/24/2022 Medical Rec #:  [id number]                  Height:       64.0 in Accession #:    [id number]                 Weight:       191.8 lb Date of Birth:  [birth year]                  BSA:          1.922 m Patient Age:    65 years                   BP:           127/71 mmHg Patient Gender: M                          HR:           89 bpm. Exam Location:  Inpatient Procedure: 2D Echo, Color Doppler and Cardiac Doppler Indications:     Endocardititis  History:         Patient has no prior history of Echocardiogram examinations.  Sonographer:      Merrie Roof RDCS Referring Phys:  [place] Diagnosing Phys: Oswaldo Milian MD IMPRESSIONS  1. Left ventricular ejection fraction, by estimation, is 60 to 65%. The left ventricle has normal function. The left ventricle has no regional wall motion abnormalities. Left ventricular diastolic parameters were normal.  2. Right ventricular systolic function is normal. The  right ventricular size is normal. Tricuspid regurgitation signal is inadequate for assessing PA pressure.  3. The mitral valve is grossly normal. No evidence of mitral valve regurgitation. No evidence of mitral stenosis.  4. The aortic valve was not well visualized. Aortic valve regurgitation is not visualized. No aortic stenosis is present.  5. The inferior vena cava is normal in size with greater than 50% respiratory variability, suggesting right atrial pressure of 3 mmHg. Conclusion(s)/Recommendation(s): No evidence of valvular vegetations on this transthoracic echocardiogram, though technically difficult study with poor visualization of valves. Consider a transesophageal echocardiogram to exclude infective endocarditis if clinically indicated. FINDINGS  Left Ventricle: Left ventricular ejection fraction, by estimation, is 60 to 65%. The left ventricle has normal function. The left ventricle has no regional wall motion abnormalities. The left ventricular internal cavity size was normal in size. There is  no left ventricular hypertrophy. Left ventricular diastolic parameters were normal. Right Ventricle: The right ventricular size is normal. No increase in right ventricular wall thickness. Right ventricular systolic function is normal. Tricuspid regurgitation signal is inadequate for assessing PA pressure. Left Atrium: Left atrial size was normal in size. Right Atrium: Right atrial size was normal in size. Pericardium: There is no evidence of pericardial effusion. Mitral Valve: The mitral valve is grossly normal. No evidence of  mitral valve regurgitation. No evidence of mitral valve stenosis. Tricuspid Valve: The tricuspid valve is grossly normal. Tricuspid valve regurgitation is not demonstrated. Aortic Valve: The aortic valve was not well visualized. Aortic valve regurgitation is not visualized. No aortic stenosis is present. Aortic valve mean gradient measures 3.0 mmHg. Aortic valve peak gradient measures 5.3 mmHg. Aortic valve area, by VTI measures 3.39 cm. Pulmonic Valve: The pulmonic valve was not well visualized. Pulmonic valve regurgitation is not visualized. Aorta: The aortic root is normal in size and structure. Venous: The inferior vena cava is normal in size with greater than 50% respiratory variability, suggesting right atrial pressure of 3 mmHg. IAS/Shunts: The interatrial septum was not well visualized.  LEFT VENTRICLE PLAX 2D LVIDd:         4.70 cm   Diastology LVIDs:         2.80 cm   LV e' medial:    8.38 cm/s LV PW:         0.80 cm   LV E/e' medial:  8.6 LV IVS:        1.00 cm   LV e' lateral:   11.20 cm/s LVOT diam:     2.00 cm   LV E/e' lateral: 6.4 LV SV:         77 LV SV Index:   40 LVOT Area:     3.14 cm  RIGHT VENTRICLE RV Mid diam:    3.00 cm LEFT ATRIUM             Index        RIGHT ATRIUM           Index LA diam:        3.20 cm 1.67 cm/m   RA Area:     13.10 cm LA Vol (A2C):   46.8 ml 24.35 ml/m  RA Volume:   23.70 ml  12.33 ml/m LA Vol (A4C):   55.9 ml 29.09 ml/m LA Biplane Vol: 53.1 ml 27.63 ml/m  AORTIC VALVE AV Area (Vmax):    2.51 cm AV Area (Vmean):   2.48 cm AV Area (VTI):     3.39 cm AV Vmax:  115.00 cm/s AV Vmean:          87.300 cm/s AV VTI:            0.228 m AV Peak Grad:      5.3 mmHg AV Mean Grad:      3.0 mmHg LVOT Vmax:         92.00 cm/s LVOT Vmean:        68.800 cm/s LVOT VTI:          0.246 m LVOT/AV VTI ratio: 1.08  AORTA Ao Root diam: 3.80 cm MITRAL VALVE MV Area (PHT): 3.34 cm    SHUNTS MV Decel Time: 227 msec    Systemic VTI:  0.25 m MV E velocity: 72.00 cm/s   Systemic Diam: 2.00 cm MV A velocity: 74.60 cm/s MV E/A ratio:  0.97 Oswaldo Milian MD Electronically signed by Oswaldo Milian MD Signature Date/Time: 03/24/2022/2:40:25 PM    Final (Updated)    MR Lumbar Spine W Wo Contrast  Result Date: 03/24/2022 CLINICAL DATA:  Initial evaluation for lower back pain, bacteremia. EXAM: MRI LUMBAR SPINE WITHOUT AND WITH CONTRAST TECHNIQUE: Multiplanar and multiecho pulse sequences of the lumbar spine were obtained without and with intravenous contrast. CONTRAST:  39m GADAVIST GADOBUTROL 1 MMOL/ML IV SOLN COMPARISON:  Prior study from 03/19/2022. FINDINGS: Segmentation: Standard. Lowest well-formed disc space labeled the L5-S1 level. Alignment: Physiologic with preservation of the normal lumbar lordosis. No listhesis. Vertebrae: Remotely healed T11 fracture with mild height loss noted. Patient is status post posterior fusion extending from the partially visualized lower thoracic spine through L1. Vertebral body height otherwise maintained with no other acute or chronic fracture. Bone marrow signal intensity within normal limits. No discrete or worrisome osseous lesions. Minimal reactive endplate change about the L3-4 interspace posteriorly, favored to be degenerative. No other evidence for acute osteomyelitis discitis or septic arthritis. Conus medullaris and cauda equina: Conus extends to the L1 level. Conus and cauda equina appear normal. No epidural collections or abnormal enhancement. Paraspinal and other soft tissues: There is apparent diffuse edema within the lower posterior paraspinous musculature (series 5, images 14, 1), nonspecific, but could reflect changes of acute myositis given history. No loculated collections. Prominent chronic fatty atrophy about the psoas musculature noted bilaterally. Subcentimeter simple cyst noted within the right kidney, benign in appearance, no follow-up imaging recommended. Visualized visceral structures otherwise  unremarkable. Disc levels: T11-12: Unremarkable. T12-L1: Prior posterior fusion.  No canal or foraminal stenosis. L1-2: Mild reactive endplate spurring without significant disc bulge. Mild facet hypertrophy. No significant canal or foraminal stenosis. L2-3: Minimal reactive endplate spurring without significant disc bulge. Mild facet spurring. No canal or foraminal stenosis. L3-4: Mild disc bulge with disc desiccation. Associated mild reactive endplate spurring. Mild facet hypertrophy. No significant canal or foraminal stenosis. L4-5: Disc desiccation without significant disc bulge. Mild reactive endplate spurring. Mild facet hypertrophy. No canal or foraminal stenosis. L5-S1: Negative interspace. Mild to moderate facet hypertrophy. No canal or foraminal stenosis. IMPRESSION: 1. Diffuse edema within the lower posterior paraspinous musculature, nonspecific, but could reflect changes of acute myositis given provided history. No loculated or drainable fluid collections. 2. No other convincing evidence for acute infection within the lumbar spine. 3. Mild for age degenerative spondylosis as above without significant canal or foraminal stenosis. 4. Chronic T11 fracture with prior posterior thoracolumbar fusion, partially visualized. Electronically Signed   By: BJeannine BogaM.D.   On: 03/24/2022 03:38   CT ABDOMEN PELVIS W CONTRAST  Result Date: 03/19/2022 CLINICAL DATA:  Sepsis. EXAM: CT ABDOMEN AND PELVIS WITH CONTRAST TECHNIQUE: Multidetector CT imaging of the abdomen and pelvis was performed using the standard protocol following bolus administration of intravenous contrast. RADIATION DOSE REDUCTION: This exam was performed according to the departmental dose-optimization program which includes automated exposure control, adjustment of the mA and/or kV according to patient size and/or use of iterative reconstruction technique. CONTRAST:  70m OMNIPAQUE IOHEXOL 350 MG/ML SOLN COMPARISON:  CT abdomen and pelvis  03/03/2022. FINDINGS: Lower chest: No acute abnormality. Hepatobiliary: Gallbladder surgically absent. 15 mm rounded hypodensity near the gallbladder fossa is unchanged favored as a small cyst. No new liver lesions are identified. No biliary ductal dilatation. Pancreas: Unremarkable. No pancreatic ductal dilatation or surrounding inflammatory changes. Spleen: Normal in size without focal abnormality. Adrenals/Urinary Tract: There is marked diffuse wall thickening of the bladder. 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 There is no hydronephrosis or perinephric fluid. No significant excretion of contrast on delayed imaging. Adrenal glands within normal limits. Stomach/Bowel: Small hiatal hernia is present. Stomach is otherwise within normal limits. Appendix appears normal. No evidence of bowel wall thickening, distention, or inflammatory changes. Vascular/Lymphatic: No significant vascular findings are present. No enlarged abdominal or pelvic lymph nodes. Reproductive: Prostate is enlarged. Other: Small fat containing left inguinal hernia. Mild presacral edema. No ascites. Musculoskeletal: There is an acute right femoral neck fracture with superolateral displacement of the distal fracture fragment. There is apex anterior angulation. There is joint effusion and small amount of air in the joint space compatible with fracture. There is surrounding soft tissue edema. No dislocation. T9-L1 posterior fusion hardware appears intact. There are healed left-sided rib fractures. IMPRESSION: 1. Acute displaced right femoral neck fracture. 2. Marked diffuse wall thickening of the bladder again seen. This may represent cystitis. Bladder diverticulum again noted. Please correlate clinically. 3. Delayed excretion of contrast from the kidneys. Correlate for medical renal disease. No hydronephrosis. Electronically Signed   By: ARonney AstersM.D.   On: 03/19/2022 20:26   DG Chest Port 1 View  Result Date:  03/19/2022 CLINICAL DATA:  Questionable sepsis - evaluate for abnormality EXAM: PORTABLE CHEST 1 VIEW COMPARISON:  Feb 28, 2022 FINDINGS: The heart size and mediastinal contours are within normal limits. No focal consolidation, pleural effusion or vascular congestion. There is minor atelectasis seen at the right lung base and is new. There are thoracolumbar fusion changes seen, stable. IMPRESSION: Minor atelectasis at the right lung base. Otherwise the study is unremarkable. Electronically Signed   By: AFrazier RichardsM.D.   On: 03/19/2022 18:04   VAS [place]PPER EXTREMITY VENOUS DUPLEX  Result Date: 03/17/2022 UPPER VENOUS STUDY  Patient Name:  JCHEZ BULNES Date of Exam:   03/17/2022 Medical Rec #: [id number]                  Accession #:    [id number] of Birth: 710-10-1960                  Patient Gender: M Patient Age:   89years [facility] Procedure:      VAS [place]PPER EXTREMITY VENOUS DUPLEX Referring Phys: SRosiland Oz--------------------------------------------------------------------------------  Indications: Left arm swelling after PICC placement Performing Technologist: ERonal FearRVS, RCS  Examination Guidelines: A complete evaluation includes B-mode imaging, spectral Doppler, color Doppler, and power Doppler as needed of all accessible portions of each vessel. Bilateral testing is considered an integral part of a complete examination. Limited  examinations for reoccurring indications may be performed as noted.  Left Findings: +----------+------------+---------+-----------+----------+-------+ LEFT      CompressiblePhasicitySpontaneousPropertiesSummary +----------+------------+---------+-----------+----------+-------+ IJV           Full                                          +----------+------------+---------+-----------+----------+-------+ Subclavian    Full                                           +----------+------------+---------+-----------+----------+-------+ Axillary      Full                                          +----------+------------+---------+-----------+----------+-------+ Brachial      Full                                          +----------+------------+---------+-----------+----------+-------+ Radial        Full                                          +----------+------------+---------+-----------+----------+-------+ Ulnar         Full                                          +----------+------------+---------+-----------+----------+-------+ Cephalic      Full                                          +----------+------------+---------+-----------+----------+-------+ Basilic       Full                                          +----------+------------+---------+-----------+----------+-------+  Findings reported to pager [phone] at 1:43 with no response.  Summary:  Left: No evidence of deep vein thrombosis in the upper extremity.  No evidence of superficial vein thrombosis in the upper extremity.  *See table(s) above for measurements and observations.  Diagnosing physician: Monica Martinez MD Electronically signed by Monica Martinez MD on 03/17/2022 at 2:21:20 PM.    Final    [place] EKG SITE RITE  Result Date: 03/04/2022 If Site Rite image not attached, placement could not be confirmed due to current cardiac rhythm.  CT ABDOMEN PELVIS W CONTRAST  Result Date: 03/03/2022 CLINICAL DATA:  Fever and chills. EXAM: CT ABDOMEN AND PELVIS WITH CONTRAST TECHNIQUE: Multidetector CT imaging of the abdomen and pelvis was performed using the standard protocol following bolus administration of intravenous contrast. RADIATION DOSE REDUCTION: This exam was performed according to the departmental dose-optimization program which includes automated exposure control, adjustment of the mA and/or kV according to patient size and/or use of iterative  reconstruction technique. CONTRAST:  141m OMNIPAQUE IOHEXOL 300  MG/ML  SOLN COMPARISON:  May 14, 2021 FINDINGS: Lower chest: No acute abnormality. Hepatobiliary: There is diffuse fatty infiltration of the liver parenchyma. A 1.1 cm cystic appearing area is seen within the right lobe of the liver, adjacent to the gallbladder fossa. Status post cholecystectomy. No biliary dilatation. Pancreas: Unremarkable. No pancreatic ductal dilatation or surrounding inflammatory changes. Spleen: Normal in size without focal abnormality. Adrenals/Urinary Tract: Adrenal glands are unremarkable. Kidneys are normal in size, without renal calculi, focal lesion, or hydronephrosis. Mild, bilateral, nonspecific perinephric inflammatory fat stranding is seen. Mild, stable, predominant posterior urinary bladder wall thickening is seen. A stable posterolateral 19 mm x 11 mm urinary bladder diverticulum is seen on the left. An additional 3.0 cm x 2.0 cm anterior urinary bladder diverticulum is noted. Stomach/Bowel: There is a very small hiatal hernia. Appendix appears normal. No evidence of bowel wall thickening, distention, or inflammatory changes. Vascular/Lymphatic: No significant vascular findings are present. No enlarged abdominal or pelvic lymph nodes. Reproductive: The prostate gland is mildly enlarged and heterogeneous in appearance. This is stable when compared to the prior study. Other: There is a stable 2.9 cm x 2.5 cm fat containing left inguinal hernia. No abdominopelvic ascites. Musculoskeletal: Postoperative changes are seen within the visualized portion of the lower thoracic spine and upper lumbar spine. No acute osseous abnormalities are identified IMPRESSION: 1. Bilateral perinephric inflammatory fat stranding which may represent sequelae associated with acute pyelonephritis. Correlation with urinalysis is recommended. 2. Hepatic steatosis. 3. Evidence of prior cholecystectomy. 4. Stable urinary bladder diverticula.  Electronically Signed   By: Virgina Norfolk M.D.   On: 03/03/2022 19:51     Subjective:  Patient denies any complaints today, no significant events overnight Discharge Exam: Vitals:   03/30/22 0813 03/30/22 1132  BP: 121/71 127/72  Pulse: 89 95  Resp: 16 13  Temp: 98.3 F (36.8 C)   SpO2:     Vitals:   03/29/22 2006 03/30/22 0006 03/30/22 0813 03/30/22 1132  BP: 121/72 113/75 121/71 127/72  Pulse: 94 87 89 95  Resp:   16 13  Temp: 99.1 F (37.3 C) 98.9 F (37.2 C) 98.3 F (36.8 C)   TempSrc: Oral Oral Oral   SpO2: 100% 99%    Weight:      Height:        General: Pt is alert, awake, not in acute distress Cardiovascular: RRR, S1/S2 +, no rubs, no gallops Respiratory: CTA bilaterally, no wheezing, no rhonchi Abdominal: Soft, NT, ND, bowel sounds + Extremities: no edema, no cyanosis  Telemetry interpreter was used during interaction  The results of significant diagnostics from this hospitalization (including imaging, microbiology, ancillary and laboratory) are listed below for reference.     Microbiology: Recent Results (from the past 240 hour(s))  Culture, blood (Routine X 2) w Reflex to ID Panel     Status: None   Collection Time: 03/21/22 12:33 AM   Specimen: BLOOD LEFT ARM  Result Value Ref Range Status   Specimen Description BLOOD LEFT ARM  Final   Special Requests   Final    BOTTLES DRAWN AEROBIC ONLY Blood Culture results may not be optimal due to an inadequate volume of blood received in culture bottles   Culture   Final    NO GROWTH 5 DAYS Performed at [facility], [place] [address], [place], [place] [address]    Report Status 03/26/2022 FINAL  Final     Labs: BNP (last 3 results) No results for input(s): "BNP" in  the last 8760 hours. Basic Metabolic Panel: Recent Labs  Lab 03/24/22 0147 03/25/22 0008 03/26/22 0124  NA 133* 135 135  K 3.7 4.0 4.0  CL 101 103 102  CO2 _0 GLUCOSE 151* 151* 138*  BUN _1 CREATININE 0.87 0.74  0.74  CALCIUM 8.3* 8.0* 8.4*   Liver Function Tests: Recent Labs  Lab 03/25/22 0008  AST 29  ALT 28  ALKPHOS 137*  BILITOT 0.4  PROT 5.5*  ALBUMIN 1.6*   No results for input(s): "LIPASE", "AMYLASE" in the last 168 hours. No results for input(s): "AMMONIA" in the last 168 hours. CBC: Recent Labs  Lab 03/24/22 0147 03/25/22 0008 03/26/22 0124  WBC 9.8 7.8 7.3  NEUTROABS  --  5.6  --   HGB 8.4* 7.6* 7.5*  HCT 24.8* 22.9* 23.4*  MCV 84.1 83.3 83.9  PLT 301 326 352   Cardiac Enzymes: No results for input(s): "CKTOTAL", "CKMB", "CKMBINDEX", "TROPONINI" in the last 168 hours. BNP: Invalid input(s): "POCBNP" CBG: Recent Labs  Lab 03/29/22 1207 03/29/22 1648 03/29/22 2009 03/30/22 0815 03/30/22 1131  GLUCAP 234* 191* 177* 157* 172*   D-Dimer No results for input(s): "DDIMER" in the last 72 hours. Hgb A1c No results for input(s): "HGBA1C" in the last 72 hours. Lipid Profile No results for input(s): "CHOL", "HDL", "LDLCALC", "TRIG", "CHOLHDL", "LDLDIRECT" in the last 72 hours. Thyroid function studies No results for input(s): "TSH", "T4TOTAL", "T3FREE", "THYROIDAB" in the last 72 hours.  Invalid input(s): "FREET3" Anemia work up No results for input(s): "VITAMINB12", "FOLATE", "FERRITIN", "TIBC", "IRON", "RETICCTPCT" in the last 72 hours. Urinalysis    Component Value Date/Time   COLORURINE YELLOW 03/19/2022 1917   APPEARANCEUR CLOUDY (A) 03/19/2022 1917   APPEARANCEUR Clear 03/19/2022 1413   LABSPEC 1.013 03/19/2022 1917   PHURINE 5.0 03/19/2022 1917   GLUCOSEU 50 (A) 03/19/2022 1917   HGBUR SMALL (A) 03/19/2022 1917   BILIRUBINUR NEGATIVE 03/19/2022 1917   BILIRUBINUR Negative 03/19/2022 1413   KETONESUR NEGATIVE 03/19/2022 1917   PROTEINUR 100 (A) 03/19/2022 1917   UROBILINOGEN 1.0 02/28/2022 1240   NITRITE NEGATIVE 03/19/2022 1917   LEUKOCYTESUR LARGE (A) 03/19/2022 1917   Sepsis Labs Recent Labs  Lab 03/24/22 0147 03/25/22 0008 03/26/22 0124   WBC 9.8 7.8 7.3   Microbiology Recent Results (from the past 240 hour(s))  Culture, blood (Routine X 2) w Reflex to ID Panel     Status: None   Collection Time: 03/21/22 12:33 AM   Specimen: BLOOD LEFT ARM  Result Value Ref Range Status   Specimen Description BLOOD LEFT ARM  Final   Special Requests   Final    BOTTLES DRAWN AEROBIC ONLY Blood Culture results may not be optimal due to an inadequate volume of blood received in culture bottles   Culture   Final    NO GROWTH 5 DAYS Performed at [facility], [place] [address], [place],  [address]    Report Status 03/26/2022 FINAL  Final     Time coordinating discharge: Over 30 minutes  SIGNED:   Phillips Climes, MD  [facility] 03/30/2022, 3:28 PM Pager   If 7PM-7AM, please contact night-coverage [email or web address] Password [email or web address]

## 2022-03-30 NOTE — Plan of Care (Signed)
  Problem: Education: Goal: Ability to describe self-care measures that may prevent or decrease complications (Diabetes Survival Skills Education) will improve Outcome: Progressing   Problem: Coping: Goal: Ability to adjust to condition or change in health will improve Outcome: Progressing   Problem: Metabolic: Goal: Ability to maintain appropriate glucose levels will improve Outcome: Progressing   Problem: Nutritional: Goal: Maintenance of adequate nutrition will improve Outcome: Progressing   Problem: Skin Integrity: Goal: Risk for impaired skin integrity will decrease Outcome: Progressing   

## 2022-03-30 NOTE — Discharge Instructions (Addendum)
Follow with Primary MD Storm Frisk, MD in 7 days   Get CBC, CMP,  checked  by Primary MD next visit.  -Please do not  reuse your in and out catheters   Activity: As tolerated with Full fall precautions use walker/cane & assistance as needed   Disposition Home    Diet: Heart healthy/carb modified  On your next visit with your primary care physician please Get Medicines reviewed and adjusted.   Please request your Prim.MD to go over all Hospital Tests and Procedure/Radiological results at the follow up, please get all Hospital records sent to your Prim MD by signing hospital release before you go home.   If you experience worsening of your admission symptoms, develop shortness of breath, life threatening emergency, suicidal or homicidal thoughts you must seek medical attention immediately by calling 911 or calling your MD immediately  if symptoms less severe.  You Must read complete instructions/literature along with all the possible adverse reactions/side effects for all the Medicines you take and that have been prescribed to you. Take any new Medicines after you have completely understood and accpet all the possible adverse reactions/side effects.   Do not drive, operating heavy machinery, perform activities at heights, swimming or participation in water activities or provide baby sitting services if your were admitted for syncope or siezures until you have seen by Primary MD or a Neurologist and advised to do so again.  Do not drive when taking Pain medications.    Do not take more than prescribed Pain, Sleep and Anxiety Medications  Special Instructions: If you have smoked or chewed Tobacco  in the last 2 yrs please stop smoking, stop any regular Alcohol  and or any Recreational drug use.  Wear Seat belts while driving.   Please note  You were cared for by a hospitalist during your hospital stay. If you have any questions about your discharge medications or the care you  received while you were in the hospital after you are discharged, you can call the unit and asked to speak with the hospitalist on call if the hospitalist that took care of you is not available. Once you are discharged, your primary care physician will handle any further medical issues. Please note that NO REFILLS for any discharge medications will be authorized once you are discharged, as it is imperative that you return to your primary care physician (or establish a relationship with a primary care physician if you do not have one) for your aftercare needs so that they can reassess your need for medications and monitor your lab values.

## 2022-03-30 NOTE — Telephone Encounter (Signed)
Call received from Laura/[employer].  She explained that they were able to track the order and it was delivered on 03/24/2022 to the address that was confirmed and the package was handed to "someone" not left at a door.    Vernona Rieger said she will request another set of samples to to be sent overnight to patient's home address. They should be delivered by Wed 04/01/2022 at the latest.  This information was shared with Tomi Bamberger, RN CM

## 2022-03-30 NOTE — TOC Transition Note (Addendum)
Transition of Care [facility]) - CM/SW Discharge Note   Patient Details  Name: John Meza MRN: [id number] Date of Birth: 08-31-1961  [facility] CM/SW Contact:  Gala Lewandowsky, RN Phone Number: 03/30/2022, 1:20 PM   Clinical Narrative:  Case Manager received a message to see if the patient can get catheters from the [facility]. Patient has not been approved for this fund and the [facility] Liaison Erskine Squibb is assisting the patient with a two week supply. Erskine Squibb is working with [employer] for samples to be delivered to the patients home. Staff RN states the spouse has not received the catheters. Erskine Squibb is calling [employer] to see which address the catheters were delivered to. Awaiting to hear back from [place]. Case Manager has called [employer] Liaison Pam and she is assisting with IV antibiotics. Patient agrees to pay out of pocket for IV antibiotics and patient will need a letter of guarantee (LOG) for Teaching laboratory technician via [employer]. Case Manager received approval from Lead Raymondo Band for the approval of the LOG. Case Manager will continue to follow for additional transition of care needs.    1342 LOG completed for this patient and sent the form to [facility] CMA. No further needs at this times.   1455 03-30-22 Case Manager received notification from [facility] Case Manager that [employer] has delivered the box of catheters and it was delivered to a family member. New Samples may reach the patient by Wednesday. Case Manager reached out to MD to see if sample supplies can be provided vs staying hospitalized until delivery.  03-30-22 1514 Spouse now remembers that she received the catheters.  Staff RN will get the patient ready for discharge. No further needs from Case Manager at this time.   Final next level of care: Home w Home Health Services Barriers to Discharge: No Barriers Identified   Patient Goals and CMS Choice Patient states their goals for this hospitalization and ongoing  recovery are:: return home with wife CMS Medicare.gov Compare Post Acute Care list provided to:: Patient Choice offered to / list presented to : Patient, Spouse                 Discharge Plan and Services In-house Referral: Financial Counselor Discharge Planning Services: CM Consult Post Acute Care Choice: Durable Medical Equipment, Home Health          DME Arranged: Other see comment (urinary catheter supplies) DME Agency: [employer] Date DME Agency Contacted: 03/23/22 Time DME Agency Contacted: 1521 Representative spoke with at DME Agency: left message for Anise Salvo at [facility] [phone] [facility] Arranged: IV Antibiotics, RN [facility] Agency: [employer] Date HH Agency Contacted: 03/30/22 Time HH Agency Contacted: 1317 Representative spoke with at [facility] Agency: Pam with Redmond Baseman with [facility]  Readmission Risk Interventions     No data to display

## 2022-03-30 NOTE — Telephone Encounter (Signed)
I spoke to Tomi Bamberger, RN CM who said that the patient's wife reported they did not receive the shipment of catheters to address; [address], [place], [place] [address].  Message sent to Anise Salvo, [employer] requesting assistance with determining where the catheters were delivered.   I called [employer] [phone], spoke to Vernona Rieger who explained that the samples would be sent out to patient from the manufacturer and the do not have a record of the shipment.  She said she will look into this issue and request catheters be re-sent if needed.  I requested a call back with an update on her findings

## 2022-03-30 NOTE — Progress Notes (Signed)
PHARMACY CONSULT NOTE FOR:  OUTPATIENT  PARENTERAL ANTIBIOTIC THERAPY (OPAT)  Indication: Recurrent ESBL E.coli bacteremia/myositis  Regimen: Meropenem 1g IV every 8 hours End date: 04/16/22  IV antibiotic discharge orders are pended. To discharging provider:  please sign these orders via discharge navigator,  Select New Orders & click on the button choice - Manage This Unsigned Work.     Thank you for allowing pharmacy to be a part of this patient's care.  Georgina Pillion, PharmD, BCPS Infectious Diseases Clinical Pharmacist 03/30/2022 11:18 AM   **Pharmacist phone directory can now be found on [email or web address] ([email or web address]).  Listed under [facility].

## 2022-03-31 ENCOUNTER — Telehealth: Payer: Self-pay

## 2022-03-31 NOTE — Telephone Encounter (Signed)
Transition Care Management Follow-up Telephone Call  Call placed with assistance of Spanish Interpre[employer] [phone]/[employer] Date of discharge and from where: 03/30/2022, [facility] How have you been since you were released from the hospital? He said he is " doing good." Any questions or concerns? Yes- his wife opened the box of catheters from [employer] while I was on the phone. She said that the catheters that were delivered are 80 F and he can't use those, they cause bleeding. He needs 53 F.  She said that they were using 14 F in the hospital and he was using 14 F at home prior to his hospitalization. She understands that he is not supposed to re-use the catheters, that can contribute to his UTIs. I told her that I would call urology and request an updated order for catheters. I also provided her with the phone number for [facility] to call with any questions.  I called [facility] and spoke to [place], [place] for Dr Marlou Porch and informed her that patient's wife reports he needs 13 F catheters. She reviewed his medical record and was able to see that their office has only ordered the catheters for the patient once and that was last week when 16 F catheters were ordered.  She will check with Dr Marlou Porch prior to re-ordering ordering and she will also contact Anise Salvo, [employer] rep.    Items Reviewed: Did the pt receive and understand the discharge instructions provided? Yes  Medications obtained and verified? Yes - -he said they have all of his medications including the IV antibiotics and they did not have any questions about the meds. He has a glucometer and his wife manages his meds.  Other? No  Any new allergies since your discharge? No  Dietary orders reviewed? Yes Do you have support at home? Yes , his wife.  They lives with his daughter.   Home Care and Equipment/Supplies: Were home health services ordered? yes If so, what is the name of the agency? [facility]  Has  the agency set up a time to come to the patient's home? Yes- the nurse came to see him today Were any new equipment or medical supplies ordered?  Yes: IV /PICC line supplies  What is the name of the medical supply agency? [employer] Were you able to get the supplies/equipment? yes Do you have any questions related to the use of the equipment or supplies? No- he said his wife has administered an IV antibiotic for him already  Functional Questionnaire: (I = Independent and D = Dependent) ADLs: dependent, paraplegic.  His wife provides all needed assistance.  Follow up appointments reviewed:  PCP Hospital f/u appt confirmed? Yes  Scheduled to see Dr [name]- 04/16/2022   Specialist Hospital f/u appt confirmed? Yes  Scheduled to see [facility] - 04/21/2022.   Are transportation arrangements needed? No  If their condition worsens, is the pt aware to call PCP or go to the Emergency Dept.? Yes Was the patient provided with contact information for the PCP's office or ED? Yes Was to pt encouraged to call back with questions or concerns? Yes

## 2022-04-02 NOTE — Telephone Encounter (Signed)
Message received from the [facility] Patient Assistance fund with approval to pay for a month of catheters for patient and re-assess the financial status next month.  I tried to call patient's wife to explain this plan and confirm the size catheters and delivery address. 1st call to [phone] made with assistance of [employer] [id number]/[employer] and the voicemail was not set up.  2nd attempt made with assistance of Spanish [employer]ter [id number]/[employer]. I explained to patient's wife  John Meza, that [facility] will  pay for a box of catheters and I will check back with her in a month to assess any changes in their finances.  She confirmed that her husband needs 14 Fr catheters and she also confirmed the current home address for delivery. She said she would like to speak with someone from [employer] about a payment plan. She was very appreciative of the assistance and said that they are using the catheters that the urologist gave them.   I called [employer] # [phone] and spoke to [facility]  / Catheter Department and explained the patient's need for the  14 Fr catheters to be overnighted if at all possible.  [facility] will pay for this month's shipment which she said is 90 catheters.  I explained that she can send [place] a bill or I can make payment over the phone. Whatever will guarantee the quickest delivery.  She then said that she will discuss this with her manager and they may be able to send another box or samples to the patient. She said she would let me know by the end of this day.  I asked that if she is not able to send samples to please call me back so payment can be made to guarantee that the patient will receive his supplies. Carollee Herter can be reached at the main [employer] number noted above x [phone]. She also said that she would have someone from billing reach out to the patient's wife to discuss a payment plan as they do offer payment plans.

## 2022-04-02 NOTE — Telephone Encounter (Signed)
Message received from [facility] stating that they are going to overnight the patient 14 Fr samples of catheters at no charge.

## 2022-04-06 ENCOUNTER — Other Ambulatory Visit: Payer: Self-pay

## 2022-04-07 NOTE — Telephone Encounter (Addendum)
Call placed to patient with assistance of [employer] # [id number]/[employer]. Spoke to patient's wife, [name], who confirmed receipt of the 14 Fr catheters and she said that she is using a new catheter for each catheterization. She has not spoken to [employer] about purchasing additional catheters and would like to discuss payment options  I told her that I would contact [employer] and ask them to call her.   I then explained the role of the community paramedicine program to her and informed her that there is no charge for the services.  She was very appreciative and accepted the services on behalf of her husband.  Referral then sent to Sharyn Creamer, EMT/GC [employer].

## 2022-04-09 ENCOUNTER — Telehealth: Payer: Self-pay

## 2022-04-09 NOTE — Telephone Encounter (Signed)
I spoke to Racheal Hoaglan/[employer] and made the payment for a box of 90 catheters: $81.41.  She said she will email me the receipt and will reach out to the patient's wife to explain payment options going forward.    Payment for catheters to be reimbursed by the [facility].

## 2022-04-16 ENCOUNTER — Ambulatory Visit: Payer: Self-pay

## 2022-04-16 ENCOUNTER — Telehealth: Payer: Self-pay

## 2022-04-16 ENCOUNTER — Other Ambulatory Visit: Payer: Self-pay

## 2022-04-16 ENCOUNTER — Ambulatory Visit: Payer: No Typology Code available for payment source | Admitting: Critical Care Medicine

## 2022-04-16 NOTE — Telephone Encounter (Signed)
  Chief Complaint: Needs information Symptoms: none Frequency: today Pertinent Negatives: Patient denies  Disposition: [] ED /[] Urgent Care (no appt availability in office) / [] Appointment(In office/virtual)/ []  [facility]/ [] Home Care/ [] Refused Recommended Disposition /[] [facility]/ [x]  Follow-up with PCP Additional Notes: Pt missed appt. Today. Pt and wife are unsure what today's appt was for. Pt still has 2 doses of medication to be given. Does pt need to come in today?  Also pt wants to know if infection has resolved.      Pt wife stated pt was seen at ED and has a needle on his right arm in which she puts medication.  However, they missed the appointment for today with Dr.Wright and are unsure if the PCP was going to remove it. Pt wife stated today was the last day for medication.    Pt wife is upset and unsure of what to do; she stated they never received a reminder call for the appointment.       Sent CRM to the office to see if we could get a sooner appointment; however,  Pt wife is seeking clinical advice.   Pt  ** The message may be incomplete. It was sent as a result of a timeout. **  Reason for Disposition  [1] Follow-up call from patient regarding patient's clinical status AND [2] information urgent  Answer Assessment - Initial Assessment Questions 1. REASON FOR CALL or QUESTION: "What is your reason for calling today?" or "How can I best help you?" or "What question do you have that I can help answer?"     Pt missed appt. Today. Pt and wife are unsure what today's appt was for. Pt still has 2 doses of medication to be given. Does pt need to come in today?  Also pt wants to know if infection has resolved. 2. CALLER: Document the source of call. (e.g., laboratory, patient).     Pt and pt's wife  Protocols used: PCP Call - No Triage-A-AH

## 2022-04-16 NOTE — Telephone Encounter (Signed)
The appt was a post hosp fu visit. Erskine Squibb see if we can double somewhere next week  I have to actually see the to be ablebto give any further orders

## 2022-04-20 NOTE — Telephone Encounter (Signed)
Opened in error

## 2022-04-21 ENCOUNTER — Other Ambulatory Visit: Payer: Self-pay

## 2022-04-21 ENCOUNTER — Encounter: Payer: Self-pay | Admitting: Infectious Diseases

## 2022-04-21 ENCOUNTER — Ambulatory Visit (HOSPITAL_COMMUNITY)
Admission: RE | Admit: 2022-04-21 | Discharge: 2022-04-21 | Disposition: A | Discharge status: Home / Self Care | Payer: No Typology Code available for payment source | Source: Ambulatory Visit | Attending: Infectious Diseases | Admitting: Infectious Diseases

## 2022-04-21 ENCOUNTER — Ambulatory Visit (INDEPENDENT_AMBULATORY_CARE_PROVIDER_SITE_OTHER): Payer: No Typology Code available for payment source | Admitting: Infectious Diseases

## 2022-04-21 ENCOUNTER — Encounter: Payer: Self-pay | Admitting: Critical Care Medicine

## 2022-04-21 ENCOUNTER — Ambulatory Visit
Payer: No Typology Code available for payment source | Attending: Critical Care Medicine | Admitting: Critical Care Medicine

## 2022-04-21 ENCOUNTER — Telehealth: Payer: Self-pay

## 2022-04-21 VITALS — BP 129/81 | HR 114 | Temp 97.9°F

## 2022-04-21 VITALS — BP 121/77 | HR 101

## 2022-04-21 DIAGNOSIS — M6008 Infective myositis, other site: Secondary | ICD-10-CM | POA: Insufficient documentation

## 2022-04-21 DIAGNOSIS — Z452 Encounter for adjustment and management of vascular access device: Secondary | ICD-10-CM

## 2022-04-21 DIAGNOSIS — A499 Bacterial infection, unspecified: Secondary | ICD-10-CM

## 2022-04-21 DIAGNOSIS — N12 Tubulo-interstitial nephritis, not specified as acute or chronic: Secondary | ICD-10-CM

## 2022-04-21 DIAGNOSIS — Z5181 Encounter for therapeutic drug level monitoring: Secondary | ICD-10-CM

## 2022-04-21 DIAGNOSIS — G8220 Paraplegia, unspecified: Secondary | ICD-10-CM

## 2022-04-21 DIAGNOSIS — E871 Hypo-osmolality and hyponatremia: Secondary | ICD-10-CM

## 2022-04-21 DIAGNOSIS — M609 Myositis, unspecified: Secondary | ICD-10-CM | POA: Insufficient documentation

## 2022-04-21 DIAGNOSIS — I10 Essential (primary) hypertension: Secondary | ICD-10-CM

## 2022-04-21 DIAGNOSIS — S72001G Fracture of unspecified part of neck of right femur, subsequent encounter for closed fracture with delayed healing: Secondary | ICD-10-CM

## 2022-04-21 DIAGNOSIS — N319 Neuromuscular dysfunction of bladder, unspecified: Secondary | ICD-10-CM

## 2022-04-21 DIAGNOSIS — Z1612 Extended spectrum beta lactamase (ESBL) resistance: Secondary | ICD-10-CM

## 2022-04-21 DIAGNOSIS — B9620 Unspecified Escherichia coli [E. coli] as the cause of diseases classified elsewhere: Secondary | ICD-10-CM

## 2022-04-21 DIAGNOSIS — E785 Hyperlipidemia, unspecified: Secondary | ICD-10-CM

## 2022-04-21 DIAGNOSIS — A4151 Sepsis due to Escherichia coli [E. coli]: Secondary | ICD-10-CM

## 2022-04-21 DIAGNOSIS — E119 Type 2 diabetes mellitus without complications: Secondary | ICD-10-CM

## 2022-04-21 DIAGNOSIS — R7881 Bacteremia: Secondary | ICD-10-CM

## 2022-04-21 DIAGNOSIS — Z794 Long term (current) use of insulin: Secondary | ICD-10-CM

## 2022-04-21 HISTORY — DX: Myositis, unspecified: M60.9

## 2022-04-21 LAB — GLUCOSE, POCT (MANUAL RESULT ENTRY): POC Glucose: 108 mg/dl — AB (ref 70–99)

## 2022-04-21 MED ORDER — GADOBUTROL 1 MMOL/ML IV SOLN
8.5000 mL | Freq: Once | INTRAVENOUS | Status: AC | PRN
Start: 2022-04-21 — End: 2022-04-21
  Administered 2022-04-21: 8.5 mL via INTRAVENOUS

## 2022-04-21 NOTE — Assessment & Plan Note (Signed)
Patient is nonweightbearing have elected to observe this for now

## 2022-04-21 NOTE — Assessment & Plan Note (Signed)
Blood sugars are in good range will not make changes in medications at this time  We will send a blood draw for A1c

## 2022-04-21 NOTE — Assessment & Plan Note (Signed)
Appears to have resolved based on exam has follow-up with infectious disease later this afternoon

## 2022-04-21 NOTE — Telephone Encounter (Signed)
Sent the following message to Jeri Modena, RN with [facility]:   "Please maintain PICC line, patient has MRI scheduled for tonight. Dr. Elinor Parkinson would like PICC to remain in place until MRI results, she will make decision regarding therapy then."  Sandie Ano, RN

## 2022-04-21 NOTE — Assessment & Plan Note (Signed)
Blood pressure at goal at this visit will not make any changes in current medication program

## 2022-04-21 NOTE — Assessment & Plan Note (Signed)
As per infectious disease 

## 2022-04-21 NOTE — Assessment & Plan Note (Signed)
We will leave in place until seen by infectious disease

## 2022-04-21 NOTE — Progress Notes (Signed)
Established Patient Office Visit  Subjective   Patient ID: John Meza, male    DOB: [birth year]  Age: 61 y.o. 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  He has had sacral decubiti which have now resolved.  He still has his original wheelchair and is not in good working order and needs a new wheelchair.  We attempted to order this 6 months ago but there was no processing of the order as he is uninsured.   Note the patient did undergo an eye exam on 14 January at the [facility] and it was negative for diabetic retinopathy.  His A1c is 8.0.  His blood sugars at home of been in the 113 130 range.  Blood sugar today is 83.  He does need lab screenings today including liver function renal function.   The patient has no other real complaints except he has occasional back spasms when he lays flat in the bed.  On arrival blood pressure is good 130/80.   4/24 Patient returns in follow-up and now has a new wheelchair with adequate padding.  Sacral decubiti this was provided with the patient assistance line.  This visit was assisted by [place] video interpreter John Meza [phone].  On arrival blood sugar is 97 and he brings his glucometer readings which range from 160-98 with an average of about 130.  Unfortunately despite this his A1c is 8.3 so he clearly has glycemic episodes at times.   He has been maintaining Sitagliptin and metformin along with insulin glargine.  Patient does need refills on medications.  Blood pressure is excellent on arrival 138/74.   Patient does complain of some blood in his urine when he self catheterizes himself he does have follow-up appointment with urology upcoming.  Patient has no  other real complaints.  6/7 Post hosp TOC This patient was seen in return follow-up and is a transition of care visit.  He was hospitalized in May with urosepsis and extended-spectrum bacterial beta-lactamase infection E. Coli  Patient received a full 2-week course of intravenous therapy and had his midline catheter removed this week.  Venous Doppler ultrasound of the upper extremity on the right was normal.  Patient currently states he is improving and has no real specific complaints.  He is paraplegic he does self catheterize.  He follows with urology.  The patient does not have any insurance.  He has been using the same 3 catheters on a cyclic basis and does self catheterize 3 times daily.  He is using soap and water to clean the catheters.  He got a fresh set of 4 catheters about a week ago at urology and they have asked to reuse those for the entire year.  He does not have insurance to purchase catheter supplies.  He is due a urine microalbumin study.  Patient does have diabetes and is taking his current medications.  Patient is on the metformin at 1000 mg twice a day and insulin Basaglar 24 units daily atorvastatin 20 mg daily valsartan HCT 320/25 daily and Januvia daily  Patient states that he is accessing his medications.  Blood sugar today on arrival is slightly elevated There are no other complaints.  Below is a copy of the discharge summary and also  the infectious disease note this week, there was a transition of care visit from the RN that occurred May 26  7/11 This patient is seen in follow-up from his second hospitalization which occurred after I last saw him in early June.  Below is documentation of that hospitalization.  Note this patient will be seeing infectious disease later this morning.  The patient has been reusing his Foley catheters for self-catheterization.  He does need urology follow-up with Dr. Marlou Meza.  Patient needs an in office cystoscopy according to the urologist  who saw the patient as an inpatient.  He also had bacteremia with an ESBL organism which seeded the muscles of the retroperitoneal area.  He received prolonged course of intravenous antibiotics which is just been completed.  He still has a PICC line in the right upper arm.  Below is a copy of the discharge summary Pt readmitted 6/8: Admit date: 03/19/2022 Discharge date: 03/30/2022   Admitted From: Home Disposition:  Home    Recommendations for Outpatient Follow-up:  1. Follow up with PCP in 1-2 weeks 2. Follow-up with ID as an outpatient         Infectious disease recommendation for antibiotics: Discharge antibiotics to be given via PICC line Discharge antibiotics: Per pharmacy protocol meropene Duration: 4 wk End Date: 04/16/22 [facility] Care Per Protocol: Home health RN for IV administration and teaching; PICC line care and labs.   Labs weekly while on IV antibiotics: _x_ CBC with differential __x BMP _x_ Please pull PIC at completion of IV antibiotics     Fax weekly labs to [phone]   Clinic Follow Up Appt: 04-21-22 10:00 with Dr John Meza     @ [facility]       Discharge Condition:Stable CODE STATUS:FULL Diet recommendation: Heart Healthy / Carb Modified    Brief/Interim Summary: John Meza is a 61 y.o. male with history of diabetes mellitus type 2, hypertension, hyperlipidemia, chronic anemia, paraplegia with neurogenic bladder uses self cath was recently admitted for ESBL E. coli bacteremia was recently on IV antibiotics.  He presents to ER  hypotensive with systolic blood pressure in the 80s with fever leukocytosis concerning for sepsis.  -His work-up significant for recurrent bacteremia, thought to be secondary to urinary source, as well MRI spine significant for myositis, TEE is negative for endocarditis.   Severe sepsis secondary to recurrent ESBL bacteremia , and myositis - MRI of spine showing paraspinal myositis,(no discitis or epidural  abscess). -ID input greatly appreciated, for now continue with IV meropenem.   will DC on IV meropenem for total of 4 weeks treatment, with end date 04/16/2022 -2D echo with no evidence of vegetation, TEE with no evidence of endocarditis - previous MD D/W Dr. Benancio Deeds 6/9 --recommends treatment of urinary infection nonemergent cystoscopy in office and outpatient follow-up with Dr. Berniece Salines of urology as an outpatient-he will set this up at this follow-up and family is aware to call and confirm -PICC line inserted 6/15. -Discussed with patient/wife with telemetry interpreter, that patient cannot  REuse his in and out catheters, and they are meant for single use, as reusing may be causing his infection.   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 - Routine management and follow-up    Incidental finding of right hip fracture on imaging  upon admission -Input greatly appreciated, not a surgical candidate given he is paraplegic and wheelchair dependent at baseline and insensate and right hip   Diabetes mellitus, type II -Resume home regimen         Discharge Diagnoses:  Active Problems:   HLD (hyperlipidemia)   Paraplegia (HCC)   Neurogenic bladder   Insulin dependent type 2 diabetes mellitus, controlled (HCC)   Essential hypertension   Lower urinary tract infectious disease   Sepsis (HCC)    In the interim we have been able to secure a supply of clean single use Foley catheters for self-catheterization through a patient assistance fund.  The visit today is accomplished with Spanish video interpreter [employer] [phone]  Patient states he has no back pain or other symptoms at this time.  The wife states there is no outstanding issues.  On arrival blood sugar was only mildly elevated  and blood pressure is good 121/77.  Patient does need lab follow-ups.      Review of Systems  Constitutional:  Negative for chills, diaphoresis, fever, malaise/fatigue and weight loss.  HENT:  Negative for congestion, ear discharge, ear pain, hearing loss, nosebleeds, sore throat and tinnitus.   Eyes:  Negative for blurred vision, double vision, photophobia, discharge and redness.  Respiratory:  Negative for cough, hemoptysis, sputum production, shortness of breath, wheezing and stridor.        No excess mucus  Cardiovascular:  Negative for chest pain, palpitations, orthopnea, claudication, leg swelling and PND.  Gastrointestinal:  Negative for abdominal pain, blood in stool, constipation, diarrhea, heartburn, melena, nausea and vomiting.  Genitourinary:  Negative for dysuria, flank pain, frequency, hematuria and urgency.  Musculoskeletal:  Negative for back pain, falls, joint pain, myalgias and neck pain.  Skin:  Negative for itching and rash.  Neurological:  Negative for dizziness, tingling, tremors, sensory change, speech change, focal weakness, seizures, loss of consciousness, weakness and headaches.  Endo/Heme/Allergies:  Negative for environmental allergies and polydipsia. Does not bruise/bleed easily.  Psychiatric/Behavioral:  Negative for depression, hallucinations, memory loss, substance abuse and suicidal ideas. The patient is not nervous/anxious and does not have insomnia.   All other systems reviewed and are negative.     Objective:     BP 121/77   Pulse (!) 101   SpO2 100%    Physical Exam Vitals reviewed.  Constitutional:      Appearance: Normal appearance. He is well-developed. He is obese. He is not diaphoretic.     Comments: Wheelchair-bound  HENT:     Head: Normocephalic and atraumatic.     Nose: No nasal deformity, septal deviation, mucosal edema or rhinorrhea.     Right Sinus: No maxillary sinus tenderness or frontal sinus tenderness.     Left Sinus: No  maxillary sinus tenderness or frontal sinus tenderness.     Mouth/Throat:     Pharynx: No oropharyngeal exudate.  Eyes:     General: No scleral icterus.    Conjunctiva/sclera: Conjunctivae normal.     Pupils: Pupils are equal, round, and reactive to light.  Neck:     Thyroid: No thyromegaly.     Vascular: No carotid bruit or JVD.     Trachea: Trachea normal. No tracheal tenderness or tracheal deviation.  Cardiovascular:     Rate and Rhythm: Normal rate and regular rhythm.     Chest Wall: PMI is not displaced.     Pulses: Normal pulses. No decreased pulses.     Heart sounds: Normal heart sounds, S1 normal and  S2 normal. Heart sounds not distant. No murmur heard.    No systolic murmur is present.     No diastolic murmur is present.     No friction rub. No gallop. No S3 or S4 sounds.  Pulmonary:     Effort: No tachypnea, accessory muscle usage or respiratory distress.     Breath sounds: No stridor. No decreased breath sounds, wheezing, rhonchi or rales.  Chest:     Chest wall: No tenderness.  Abdominal:     General: Bowel sounds are normal. There is no distension.     Palpations: Abdomen is soft. Abdomen is not rigid.     Tenderness: There is no abdominal tenderness. There is no guarding or rebound.  Musculoskeletal:        General: Normal range of motion.     Cervical back: Normal range of motion and neck supple. No edema, erythema or rigidity. No muscular tenderness. Normal range of motion.  Lymphadenopathy:     Head:     Right side of head: No submental or submandibular adenopathy.     Left side of head: No submental or submandibular adenopathy.     Cervical: No cervical adenopathy.  Skin:    General: Skin is warm and dry.     Coloration: Skin is not pale.     Findings: No rash.     Nails: There is no clubbing.     Comments: Right upper extremity has a PICC line it appears to be dressed properly and is not infected as a single port  Neurological:     Mental Status: He is  alert and oriented to person, place, and time. Mental status is at baseline.     Sensory: No sensory deficit.     Motor: Weakness present.  Psychiatric:        Speech: Speech normal.        Behavior: Behavior normal.      Results for orders placed or performed in visit on 04/21/22  POCT glucose (manual entry)  Result Value Ref Range   POC Glucose 108 (A) 70 - 99 mg/dl      The ASCVD Risk score (Arnett DK, et al., 2019) failed to calculate for the following reasons:   The valid total cholesterol range is 130 to 320 mg/dL    Assessment & Plan:   Problem List Items Addressed This Visit       Cardiovascular and Mediastinum   Essential hypertension    Blood pressure at goal at this visit will not make any changes in current medication program        Endocrine   Insulin dependent type 2 diabetes mellitus, controlled (HCC) - Primary    Blood sugars are in good range will not make changes in medications at this time  We will send a blood draw for A1c      Relevant Orders   POCT glucose (manual entry) (Completed)   Comprehensive metabolic panel   Lipid panel   Hemoglobin A1c     Musculoskeletal and Integument   Closed fracture of neck of right femur (HCC)    Patient is nonweightbearing have elected to observe this for now      Myositis    Appears to have resolved based on exam has follow-up with infectious disease later this afternoon        Genitourinary   Pyelonephritis due to Escherichia coli    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  Follow-up per urology and infectious  disease        Other   HLD (hyperlipidemia)    Reassess lipids      Hyponatremia    Reassess metabolic panel      Bacteremia    Appears to have cleared follow-up per infectious disease      Relevant Orders   CBC with Differential/Platelet   ESBL (extended spectrum beta-lactamase) producing bacteria infection    As per infectious disease      Medication monitoring  encounter    As per infectious disease      PICC (peripherally inserted central catheter) in place    We will leave in place until seen by infectious disease      RESOLVED: Sepsis (HCC)    Sepsis has resolved on exam      Relevant Orders   CBC with Differential/Platelet  38 minutes spent reviewing all records from recent hospitalizations Return in about 2 months (around 06/22/2022).    [name], MD

## 2022-04-21 NOTE — Assessment & Plan Note (Signed)
Reassess metabolic panel 

## 2022-04-21 NOTE — Assessment & Plan Note (Signed)
Sepsis has resolved on exam

## 2022-04-21 NOTE — Patient Instructions (Signed)
Complete set of labs obtained at this visit  No change in medications  Keep your appointment with infectious disease at 10 AM today  Return to Dr. [name] 2 months

## 2022-04-21 NOTE — Assessment & Plan Note (Signed)
Reassess lipids  

## 2022-04-21 NOTE — Assessment & Plan Note (Signed)
Appears to have cleared follow-up per infectious disease

## 2022-04-21 NOTE — Progress Notes (Addendum)
Patient Active Problem List   Diagnosis Date Noted   Sepsis ([place]) 03/19/2022   Closed fracture of neck of right femur ([place])    Pyelonephritis due to Escherichia coli 03/16/2022   Bacteremia 03/16/2022   ESBL (extended spectrum beta-lactamase) producing bacteria infection 03/16/2022   Normocytic anemia 03/03/2022   Lower urinary tract infectious disease 02/28/2022   Wheelchair dependence 12/23/2020   History of cholecystectomy 11/18/2020   Insulin dependent type 2 diabetes mellitus, controlled ([place])    Hyponatremia    Essential hypertension    Neurogenic bladder 01/06/2012   Neurogenic bowel 01/06/2012   Paraplegia ([place]) 11/16/2011   OBESITY, MODERATE 05/26/2007   HLD (hyperlipidemia) 05/25/2007    Patient's Medications  New Prescriptions   No medications on file  Previous Medications   ATORVASTATIN (LIPITOR) 20 MG TABLET    Take 1 tablet (20 mg total) by mouth daily at 12 noon.   FERROUS SULFATE 325 (65 FE) MG TABLET    Take 1 tablet (325 mg total) by mouth daily.   INSULIN GLARGINE (BASAGLAR KWIKPEN) 100 UNIT/ML    Inject 25 Units into the skin at bedtime.   INSULIN PEN NEEDLE (TECHLITE PEN NEEDLES) 32G X 4 MM MISC    Use as directed to inject insulin   METFORMIN (GLUCOPHAGE) 1000 MG TABLET    TAKE 1 TABLET (1,000 MG TOTAL) BY MOUTH 2 (TWO) TIMES DAILY WITH A MEAL.   METHOCARBAMOL (ROBAXIN) 500 MG TABLET    Take 1 tablet (500 mg total) by mouth every 6 (six) hours as needed for muscle spasms.   OMEPRAZOLE (PRILOSEC) 20 MG CAPSULE    Take 1 capsule by mouth daily to reduce stomach acid   SITAGLIPTIN (JANUVIA) 100 MG TABLET    Take 1 tablet by mouth daily   VALSARTAN-HYDROCHLOROTHIAZIDE (DIOVAN-HCT) 320-25 MG TABLET    Take 1 tablet by mouth daily.  Modified Medications   No medications on file  Discontinued Medications   No medications on file    Subjective: Recently admitted for recurrent ESBL E coli bacteremia thought to be 2/2 urinary source due to reusing  catheter for self catheterization. Repeat blood cx 6/10 NG. TTE and TEE negative for endocarditis. MRI L spine 03/23/22 diffuse edema within the lower posterior paraspinous musculature with concerns for acute myositis. No loculated or drainable fluid collections. Seen by ID and discharged on 4 weeks of IV meropenem>carbapenem. EOT 04/16/22. Also found to have RT hip #, considered to be a non surgical candidate.   Today's Visit Spoke with patient with the help of in person [place] interpreter. Accompanied by wife. Completed IV abtx 7/6. Still has rt arm PICC. Denies fevers, chills and sweats. Denies pain/tenderness and swelling at the PICC site. Denies nausea, vomiting and diarrhea. He self caths urine but does not reuse it as previously he used to do.   Review of Systems: all systems reviewed with pertinent positives and negatives as listed above   Past Medical History:  Diagnosis Date   Cellulitis and abscess of buttock 10/2016   Diabetes mellitus    Hyperkalemia 03/16/2022   Hypertension    Paraplegia ([place]) 2013   fell from ladder   Pressure ulcer, buttock, right, unstageable ([place]) 12/30/2020   Sacral decubitus ulcer, stage IV ([place]) 11/03/2021   SCI (spinal cord injury)    Sepsis due to Escherichia coli (E. coli) ([place]) 03/01/2022   Spine fracture 11/16/2011   T 11- T9-L1    Social History   Tobacco Use  Smoking status: Former    Packs/day: 1.00    Years: 5.00    Total pack years: 5.00    Types: Cigarettes    Quit date: 10/13/2011    Years since quitting: 10.5   Smokeless tobacco: Never   Tobacco comments:    03-24-19 per pt he stopped 1 mo ago   Vaping Use   Vaping Use: Never used  Substance Use Topics   Alcohol use: Not Currently    Alcohol/week: 1.0 standard drink of alcohol    Types: 1 Cans of beer per week   Drug use: Never    Family History  Problem Relation Age of Onset   Healthy Mother    Diabetes Father    Diabetes Sister     Allergies  Allergen Reactions    Macrobid [Nitrofurantoin] Itching and Rash    Health Maintenance  Topic Date Due   Zoster Vaccines- Shingrix (2 of 2) 03/30/2022   INFLUENZA VACCINE  05/12/2022   HEMOGLOBIN A1C  08/04/2022   OPHTHALMOLOGY EXAM  10/24/2022   COLON CANCER SCREENING ANNUAL FOBT  02/03/2023   TETANUS/TDAP  12/18/2027   Hepatitis C Screening  Completed   HIV Screening  Completed   HPV VACCINES  Aged Out   FOOT EXAM  Discontinued   COLONOSCOPY (Pts 45-80yr Insurance coverage will need to be confirmed)  Discontinued   COVID-19 Vaccine  Discontinued    Objective:  Vitals:   04/21/22 0958  BP: 129/81  Pulse: (!) 114  Temp: 97.9 F (36.6 C)  TempSrc: Oral  SpO2: 100%   There is no height or weight on file to calculate BMI.  Physical Exam Constitutional:      Appearance: Normal appearance. Sitting in a wheel chair  HENT:     Head: Normocephalic and atraumatic.      Mouth: Mucous membranes are moist.  Eyes:    Conjunctiva/sclera: Conjunctivae normal.     Pupils:   Cardiovascular:     Rate and Rhythm: Normal rate and regular rhythm.     Heart sounds:  Pulmonary:     Effort: Pulmonary effort is normal.     Breath sounds: Normal breath sounds.   Abdominal:     General: Non distended     Palpations: soft.   Skin:    General: Skin is warm and dry.     Comments: PICC - OK  Neurological:     General: paraplegic    Mental Status: awake, alert and oriented to person, place, and time.   Psychiatric:        Mood and Affect: Mood normal.   Lab Results Lab Results  Component Value Date   WBC 7.3 03/26/2022   HGB 7.5 (L) 03/26/2022   HCT 23.4 (L) 03/26/2022   MCV 83.9 03/26/2022   PLT 352 03/26/2022    Lab Results  Component Value Date   CREATININE 0.74 03/26/2022   BUN 8 03/26/2022   NA 135 03/26/2022   K 4.0 03/26/2022   CL 102 03/26/2022   CO2 24 03/26/2022    Lab Results  Component Value Date   ALT 28 03/25/2022   AST 29 03/25/2022   ALKPHOS 137 (H) 03/25/2022    BILITOT 0.4 03/25/2022    Lab Results  Component Value Date   CHOL 110 12/28/2019   HDL 40 12/28/2019   LDLCALC 50 12/28/2019   TRIG 110 12/28/2019   CHOLHDL 2.8 12/28/2019   No results found for: "LABRPR", "RPRTITER" No results found for: "HIV1RNAQUANT", "HIV1RNAVL", "  CD4TABS"   Microbiology Results for orders placed or performed during the hospital encounter of 03/19/22  Urine Culture     Status: Abnormal   Collection Time: 03/19/22  4:04 PM   Specimen: In/Out Cath Urine  Result Value Ref Range Status   Specimen Description IN/OUT CATH URINE  Final   Special Requests   Final    NONE Performed at [facility], [place] [address] [place], [place] [address]    Culture >=100,000 COLONIES/mL YEAST (A)  Final   Report Status 03/22/2022 FINAL  Final  Blood Culture (routine x 2)     Status: Abnormal   Collection Time: 03/19/22  4:13 PM   Specimen: BLOOD LEFT FOREARM  Result Value Ref Range Status   Specimen Description BLOOD LEFT FOREARM  Final   Special Requests   Final    BOTTLES DRAWN AEROBIC AND ANAEROBIC Blood Culture adequate volume   Culture  Setup Time   Final    GRAM NEGATIVE RODS ANAEROBIC BOTTLE ONLY CRITICAL RESULT CALLED TO, READ BACK BY AND VERIFIED WITH: Cathlyn Parsons [id number] '@0813'  FH Performed at [facility], [place] [address], [place], [place] [address]    Culture (A)  Final    ESCHERICHIA COLI Confirmed Extended Spectrum Beta-Lactamase Producer (ESBL).  In bloodstream infections from ESBL organisms, carbapenems are preferred over piperacillin/tazobactam. They are shown to have a lower risk of mortality.    Report Status 03/22/2022 FINAL  Final   Organism ID, Bacteria ESCHERICHIA COLI  Final      Susceptibility   Escherichia coli - MIC*    AMPICILLIN >=32 RESISTANT Resistant     CEFAZOLIN >=64 RESISTANT Resistant     CEFEPIME 1 SENSITIVE Sensitive     CEFTAZIDIME RESISTANT Resistant     CEFTRIAXONE 32 RESISTANT Resistant     CIPROFLOXACIN >=4  RESISTANT Resistant     GENTAMICIN <=1 SENSITIVE Sensitive     IMIPENEM <=0.25 SENSITIVE Sensitive     TRIMETH/SULFA >=320 RESISTANT Resistant     AMPICILLIN/SULBACTAM >=32 RESISTANT Resistant     PIP/TAZO 64 INTERMEDIATE Intermediate     * ESCHERICHIA COLI  Blood Culture ID Panel (Reflexed)     Status: Abnormal   Collection Time: 03/19/22  4:13 PM  Result Value Ref Range Status   Enterococcus faecalis NOT DETECTED NOT DETECTED Final   Enterococcus Faecium NOT DETECTED NOT DETECTED Final   Listeria monocytogenes NOT DETECTED NOT DETECTED Final   Staphylococcus species NOT DETECTED NOT DETECTED Final   Staphylococcus aureus (BCID) NOT DETECTED NOT DETECTED Final   Staphylococcus epidermidis NOT DETECTED NOT DETECTED Final   Staphylococcus lugdunensis NOT DETECTED NOT DETECTED Final   Streptococcus species NOT DETECTED NOT DETECTED Final   Streptococcus agalactiae NOT DETECTED NOT DETECTED Final   Streptococcus pneumoniae NOT DETECTED NOT DETECTED Final   Streptococcus pyogenes NOT DETECTED NOT DETECTED Final   A.calcoaceticus-baumannii NOT DETECTED NOT DETECTED Final   Bacteroides fragilis NOT DETECTED NOT DETECTED Final   Enterobacterales DETECTED (A) NOT DETECTED Final    Comment: Enterobacterales represent a large order of gram negative bacteria, not a single organism. CRITICAL RESULT CALLED TO, READ BACK BY AND VERIFIED WITH: PHARMD E. Hassell Done [id number] '@0815'  FH    Enterobacter cloacae complex NOT DETECTED NOT DETECTED Final   Escherichia coli DETECTED (A) NOT DETECTED Final    Comment: CRITICAL RESULT CALLED TO, READ BACK BY AND VERIFIED WITH: PHARMD E. Hassell Done [id number] '@0815'  FH    Klebsiella aerogenes NOT DETECTED NOT DETECTED  Final   Klebsiella oxytoca NOT DETECTED NOT DETECTED Final   Klebsiella pneumoniae NOT DETECTED NOT DETECTED Final   Proteus species NOT DETECTED NOT DETECTED Final   Salmonella species NOT DETECTED NOT DETECTED Final   Serratia marcescens NOT DETECTED NOT  DETECTED Final   Haemophilus influenzae NOT DETECTED NOT DETECTED Final   Neisseria meningitidis NOT DETECTED NOT DETECTED Final   Pseudomonas aeruginosa NOT DETECTED NOT DETECTED Final   Stenotrophomonas maltophilia NOT DETECTED NOT DETECTED Final   Candida albicans NOT DETECTED NOT DETECTED Final   Candida auris NOT DETECTED NOT DETECTED Final   Candida glabrata NOT DETECTED NOT DETECTED Final   Candida krusei NOT DETECTED NOT DETECTED Final   Candida parapsilosis NOT DETECTED NOT DETECTED Final   Candida tropicalis NOT DETECTED NOT DETECTED Final   Cryptococcus neoformans/gattii NOT DETECTED NOT DETECTED Final   CTX-M ESBL DETECTED (A) NOT DETECTED Final    Comment: CRITICAL RESULT CALLED TO, READ BACK BY AND VERIFIED WITH: PHARMD EHassell Done [id number] '@0815'  FH (NOTE) Extended spectrum beta-lactamase detected. Recommend a carbapenem as initial therapy.      Carbapenem resistance IMP NOT DETECTED NOT DETECTED Final   Carbapenem resistance KPC NOT DETECTED NOT DETECTED Final   Carbapenem resistance NDM NOT DETECTED NOT DETECTED Final   Carbapenem resist OXA 48 LIKE NOT DETECTED NOT DETECTED Final   Carbapenem resistance VIM NOT DETECTED NOT DETECTED Final    Comment: Performed at [facility], [place] [address], [place], [place] [address]  SARS Coronavirus 2 by RT PCR (hospital order, performed in [facility] hospital lab) *cepheid single result test* Anterior Nasal Swab     Status: None   Collection Time: 03/19/22  4:28 PM   Specimen: Anterior Nasal Swab  Result Value Ref Range Status   SARS Coronavirus 2 by RT PCR NEGATIVE NEGATIVE Final    Comment: (NOTE) SARS-CoV-2 target nucleic acids are NOT DETECTED.  The SARS-CoV-2 RNA is generally detectable in upper and lower respiratory specimens during the acute phase of infection. The lowest concentration of SARS-CoV-2 viral copies this assay can detect is 250 copies / mL. A negative result does not preclude SARS-CoV-2 infection and  should not be used as the sole basis for treatment or other patient management decisions.  A negative result may occur with improper specimen collection / handling, submission of specimen other than nasopharyngeal swab, presence of viral mutation(s) within the areas targeted by this assay, and inadequate number of viral copies (<250 copies / mL). A negative result must be combined with clinical observations, patient history, and epidemiological information.  Fact Sheet for Patients:   https://www.patel.info/  Fact Sheet for Healthcare Providers: https://hall.com/  This test is not yet approved or  cleared by the [place] FDA and has been authorized for detection and/or diagnosis of SARS-CoV-2 by FDA under an Emergency Use Authorization (EUA).  This EUA will remain in effect (meaning this test can be used) for the duration of the COVID-19 declaration under Section 564(b)(1) of the Act, 21 U.S.C. section 360bbb-3(b)(1), unless the authorization is terminated or revoked sooner.  Performed at [facility], [place] [address], [place], [place] [address]   Blood Culture (routine x 2)     Status: Abnormal   Collection Time: 03/19/22  5:59 PM   Specimen: BLOOD  Result Value Ref Range Status   Specimen Description BLOOD SITE NOT SPECIFIED  Final   Special Requests   Final    BOTTLES DRAWN AEROBIC AND ANAEROBIC Blood  Culture adequate volume   Culture  Setup Time   Final    GRAM NEGATIVE RODS IN BOTH AEROBIC AND ANAEROBIC BOTTLES CRITICAL VALUE NOTED.  VALUE IS CONSISTENT WITH PREVIOUSLY REPORTED AND CALLED VALUE. CRITICAL RESULT CALLED TO, READ BACK BY AND VERIFIED WITH: PHARMD E. Hassell Done [id number] '@0815'  FH    Culture (A)  Final    ESCHERICHIA COLI SUSCEPTIBILITIES PERFORMED ON PREVIOUS CULTURE WITHIN THE LAST 5 DAYS. Performed at [facility], [place] [address] [place], [place] [address]    Report Status 03/22/2022 FINAL  Final   Culture, blood (Routine X 2) w Reflex to ID Panel     Status: None   Collection Time: 03/21/22 12:33 AM   Specimen: BLOOD LEFT ARM  Result Value Ref Range Status   Specimen Description BLOOD LEFT ARM  Final   Special Requests   Final    BOTTLES DRAWN AEROBIC ONLY Blood Culture results may not be optimal due to an inadequate volume of blood received in culture bottles   Culture   Final    NO GROWTH 5 DAYS Performed at [facility], [place] [address] [place], [place] [address]    Report Status 03/26/2022 FINAL  Final     Problem List Items Addressed This Visit       Musculoskeletal and Integument   Myositis - Primary   Relevant Orders   MR Lumbar Spine W Wo Contrast (Completed)     Genitourinary   Pyelonephritis due to Escherichia coli     Other   Bacteremia   Medication monitoring encounter   PICC (peripherally inserted central catheter) in place   # 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 Professional time spent includes the following activities: Preparing to see the patient (review of tests), Obtaining and/or reviewing separately obtained history (admission/discharge record), Performing a medically appropriate examination and/or evaluation , Ordering medications/tests/procedures, referring and communicating with other health care professionals, Documenting clinical information in the EMR, Independently interpreting results (not separately reported), Communicating  results to the patient/family/caregiver, Counseling and educating the patient/family/caregiver and Care coordination (not separately reported).   Wilber Oliphant, [facility] 04/21/2022, 9:59 AM

## 2022-04-21 NOTE — Assessment & Plan Note (Signed)
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

## 2022-04-22 ENCOUNTER — Telehealth: Payer: Self-pay

## 2022-04-22 LAB — COMPREHENSIVE METABOLIC PANEL
ALT: 10 IU/L (ref 0–44)
AST: 15 IU/L (ref 0–40)
Albumin/Globulin Ratio: 0.9 — ABNORMAL LOW (ref 1.2–2.2)
Albumin: 3.3 g/dL — ABNORMAL LOW (ref 3.8–4.9)
Alkaline Phosphatase: 177 IU/L — ABNORMAL HIGH (ref 44–121)
BUN/Creatinine Ratio: 18 (ref 9–20)
BUN: 15 mg/dL (ref 6–24)
Bilirubin Total: 0.4 mg/dL (ref 0.0–1.2)
CO2: 18 mmol/L — ABNORMAL LOW (ref 20–29)
Calcium: 9 mg/dL (ref 8.7–10.2)
Chloride: 97 mmol/L (ref 96–106)
Creatinine, Ser: 0.84 mg/dL (ref 0.76–1.27)
Globulin, Total: 3.6 g/dL (ref 1.5–4.5)
Glucose: 95 mg/dL (ref 70–99)
Potassium: 5.1 mmol/L (ref 3.5–5.2)
Sodium: 132 mmol/L — ABNORMAL LOW (ref 134–144)
Total Protein: 6.9 g/dL (ref 6.0–8.5)
eGFR: 101 mL/min/{1.73_m2} (ref >59)

## 2022-04-22 LAB — CBC WITH DIFFERENTIAL/PLATELET
Basophils Absolute: 0.1 10*3/uL (ref 0.0–0.2)
Basos: 1 %
EOS (ABSOLUTE): 1.1 10*3/uL — ABNORMAL HIGH (ref 0.0–0.4)
Eos: 10 %
Hematocrit: 26 % — ABNORMAL LOW (ref 37.5–51.0)
Hemoglobin: 8.3 g/dL — ABNORMAL LOW (ref 13.0–17.7)
Immature Grans (Abs): 0.1 10*3/uL (ref 0.0–0.1)
Immature Granulocytes: 1 %
Lymphocytes Absolute: 2.3 10*3/uL (ref 0.7–3.1)
Lymphs: 22 %
MCH: 26.3 pg — ABNORMAL LOW (ref 26.6–33.0)
MCHC: 31.9 g/dL (ref 31.5–35.7)
MCV: 83 fL (ref 79–97)
Monocytes Absolute: 0.6 10*3/uL (ref 0.1–0.9)
Monocytes: 6 %
Neutrophils Absolute: 6.5 10*3/uL (ref 1.4–7.0)
Neutrophils: 60 %
Platelets: 430 10*3/uL (ref 150–450)
RBC: 3.15 x10E6/uL — ABNORMAL LOW (ref 4.14–5.80)
RDW: 15.2 % (ref 11.6–15.4)
WBC: 10.7 10*3/uL (ref 3.4–10.8)

## 2022-04-22 LAB — LIPID PANEL
Chol/HDL Ratio: 2.9 ratio (ref 0.0–5.0)
Cholesterol, Total: 102 mg/dL (ref 100–199)
HDL: 35 mg/dL — ABNORMAL LOW (ref >39)
LDL Chol Calc (NIH): 48 mg/dL (ref 0–99)
Triglycerides: 101 mg/dL (ref 0–149)
VLDL Cholesterol Cal: 19 mg/dL (ref 5–40)

## 2022-04-22 LAB — HEMOGLOBIN A1C
Est. average glucose Bld gHb Est-mCnc: 174 mg/dL
Hgb A1c MFr Bld: 7.7 % — ABNORMAL HIGH (ref 4.8–5.6)

## 2022-04-22 NOTE — Progress Notes (Signed)
Let patient know kidney and liver normal, blood counts stable, cholesterol normal, A1C 7.7 is improved, no change in cholesterol medication, and no change in diabetic treatments

## 2022-04-22 NOTE — Telephone Encounter (Signed)
Sent orders to Jeri Modena, RN with [facility] to pull PICC per Dr. Elinor Parkinson.    Called patient with interpreter to discuss updated plan of care, no answer and unable to leave message.   Sandie Ano, RN

## 2022-04-22 NOTE — Telephone Encounter (Signed)
-----   Message from Odette Fraction, MD sent at 04/22/2022  8:56 AM EDT ----- MRI findings reviewed, persistent diffuse edema in the lower para spinous musculature but infection less likely.  DC PICC line No need for further antibiotics

## 2022-04-23 ENCOUNTER — Telehealth (HOSPITAL_COMMUNITY): Payer: Self-pay

## 2022-04-23 NOTE — Telephone Encounter (Signed)
Using the translator line I attempted to reach out to Mr. Moyano to set up home visit for paramedicine, however phone went to voicemail with no option to leave a message as mail box had not been set up. I plan to reach out again next week for same.   Maralyn Sago, EMT-Paramedic [phone] 04/23/2022

## 2022-04-24 ENCOUNTER — Telehealth: Payer: Self-pay

## 2022-04-24 NOTE — Telephone Encounter (Signed)
Pt was called and is aware of results, DOB was confirmed.   Interpreter id [phone]

## 2022-04-24 NOTE — Telephone Encounter (Signed)
-----   Message from Storm Frisk, MD sent at 04/22/2022  6:09 AM EDT ----- Let patient know kidney and liver normal, blood counts stable, cholesterol normal, A1C 7.7 is improved, no change in cholesterol medication, and no change in diabetic treatments

## 2022-04-24 NOTE — Telephone Encounter (Signed)
Called patient via [employer] (Interpreter [place] ID [phone]) and spoke with patient's spouse Evangeline Dakin), who confirmed the PICC line was removed and that they understood there was no need for further antibiotics. No additional questions.  Wyvonne Lenz, RN

## 2022-04-25 ENCOUNTER — Encounter (HOSPITAL_COMMUNITY): Payer: Self-pay

## 2022-04-25 ENCOUNTER — Emergency Department (HOSPITAL_COMMUNITY): Payer: No Typology Code available for payment source

## 2022-04-25 ENCOUNTER — Emergency Department (HOSPITAL_COMMUNITY)
Admission: EM | Admit: 2022-04-25 | Discharge: 2022-04-25 | Disposition: A | Discharge status: Home / Self Care | Payer: No Typology Code available for payment source | Attending: Emergency Medicine | Admitting: Emergency Medicine

## 2022-04-25 ENCOUNTER — Other Ambulatory Visit: Payer: Self-pay

## 2022-04-25 DIAGNOSIS — G8220 Paraplegia, unspecified: Secondary | ICD-10-CM | POA: Insufficient documentation

## 2022-04-25 DIAGNOSIS — M79671 Pain in right foot: Secondary | ICD-10-CM | POA: Insufficient documentation

## 2022-04-25 DIAGNOSIS — S728X1A Other fracture of right femur, initial encounter for closed fracture: Secondary | ICD-10-CM

## 2022-04-25 DIAGNOSIS — Z794 Long term (current) use of insulin: Secondary | ICD-10-CM | POA: Insufficient documentation

## 2022-04-25 DIAGNOSIS — Z79899 Other long term (current) drug therapy: Secondary | ICD-10-CM | POA: Insufficient documentation

## 2022-04-25 DIAGNOSIS — S72441A Displaced fracture of lower epiphysis (separation) of right femur, initial encounter for closed fracture: Secondary | ICD-10-CM | POA: Insufficient documentation

## 2022-04-25 DIAGNOSIS — W11XXXA Fall on and from ladder, initial encounter: Secondary | ICD-10-CM | POA: Insufficient documentation

## 2022-04-25 NOTE — ED Triage Notes (Addendum)
Pt arrived POV from home c/o right foot issue. Pt states he was seen recently and told he had a fracture but there was nothing to be done about his foot but then yesterday he was in the shower and felt it pop but d/t disability cannot feel his feet,

## 2022-04-25 NOTE — Consult Note (Signed)
Reason for Consult:Right proximal femur Referring Physician: [facility] department  [facility] John Meza is an 61 y.o. male.  HPI: Patient presented the emergency department after feeling a pop in his right hip.  He has a history of a incidental femoral neck and intertrochanteric type fracture seen approximately 1 month ago on x-rays after the patient admitted to the hospitalist for infection.  At the time given his paraplegic status there is decision made for nonoperative treatment.  He is here today with some subtrochanteric extension of the fracture.  Orthopedics was consulted.  The patient has not followed up with orthopedics since his visit a month ago.  He uses a wheelchair at baseline.  He denies pain.  Past Medical History:  Diagnosis Date   Cellulitis and abscess of buttock 10/2016   Diabetes mellitus    Hyperkalemia 03/16/2022   Hypertension    Paraplegia (HCC) 2013   fell from ladder   Pressure ulcer, buttock, right, unstageable (HCC) 12/30/2020   Sacral decubitus ulcer, stage IV (HCC) 11/03/2021   SCI (spinal cord injury)    Sepsis due to Escherichia coli (E. coli) (HCC) 03/01/2022   Spine fracture 11/16/2011   T 11- T9-L1    Past Surgical History:  Procedure Laterality Date   CHOLECYSTECTOMY N/A 05/01/2015   Procedure: LAPAROSCOPIC CHOLECYSTECTOMY WITH ATTEMPTED INTRAOPERATIVE CHOLANGIOGRAM;  Surgeon: Violeta Gelinas, MD;  Location: [facility] OR;  Service: General;  Laterality: N/A;   SPINE SURGERY     TEE WITHOUT CARDIOVERSION N/A 03/26/2022   Procedure: TRANSESOPHAGEAL ECHOCARDIOGRAM (TEE);  Surgeon: Elease Hashimoto Deloris Ping, MD;  Location: [facility] ENDOSCOPY;  Service: Cardiovascular;  Laterality: N/A;    Family History  Problem Relation Age of Onset   Healthy Mother    Diabetes Father    Diabetes Sister     Social History:  reports that he quit smoking about 10 years ago. His smoking use included cigarettes. He has a 5.00 pack-year smoking history. He has never used smokeless  tobacco. He reports that he does not currently use alcohol after a past usage of about 1.0 standard drink of alcohol per week. He reports that he does not use drugs.  Allergies:  Allergies  Allergen Reactions   Macrobid [Nitrofurantoin] Itching and Rash    Medications: I have reviewed the patient's current medications.  No results found for this or any previous visit (from the past 48 hour(s)).  DG Chest 1 View  Result Date: 04/25/2022 CLINICAL DATA:  Paralysis from waist down EXAM: CHEST  1 VIEW COMPARISON:  March 19, 2022 FINDINGS: The heart size and mediastinal contours are within normal limits. Both lungs are clear. The visualized skeletal structures are unremarkable. IMPRESSION: No active disease. Electronically Signed   By: Gerome Sam III M.D.   On: 04/25/2022 13:45   DG Lumbar Spine 2-3 Views  Result Date: 04/25/2022 CLINICAL DATA:  Fracture in lower extremity EXAM: LUMBAR SPINE - 2-3 VIEW COMPARISON:  None Available. FINDINGS: Pedicle rods and screws are seen at T10, T12, and L1. The pedicle rods and screws span a T11 vertebral body fracture which is unchanged since October 25, 2012. No acute fractures or malalignment. Multilevel degenerative disc disease. Lower lumbar facet degenerative changes. IMPRESSION: No acute fracture or malalignment. Surgical hardware remains from T10 through L1. The T11 vertebral body fracture identified in January 2014 is stable. Electronically Signed   By: Gerome Sam III M.D.   On: 04/25/2022 13:44   DG Femur Min 2 Views Right  Result Date: 04/25/2022 CLINICAL  DATA:  Hip fracture EXAM: RIGHT FEMUR 2 VIEWS COMPARISON:  None Available. FINDINGS: There is a displaced fracture through the proximal femoral diaphysis. There is a fracture through the femoral neck as well. No dislocation of the femoral head identified on provided images. No other abnormalities. IMPRESSION: Displaced fractures through the femoral neck and the proximal femoral diaphysis.  Electronically Signed   By: Gerome Sam III M.D.   On: 04/25/2022 13:43   DG Tibia/Fibula Right  Result Date: 04/25/2022 CLINICAL DATA:  Recent hip fracture.  Paraplegic. EXAM: RIGHT TIBIA AND FIBULA - 2 VIEW COMPARISON:  None Available. FINDINGS: There is no evidence of fracture or other focal bone lesions. Soft tissues are unremarkable. IMPRESSION: Negative. Electronically Signed   By: Gerome Sam III M.D.   On: 04/25/2022 13:41   DG Pelvis 1-2 Views  Result Date: 04/25/2022 CLINICAL DATA:  Patient heard a pop from his right lower extremity. In patient is paralyzed below the waist. EXAM: PELVIS - 1-2 VIEW COMPARISON:  Pelvis radiographs 05/14/2021. FINDINGS: Oblique subtrochanteric fracture noted. A cervical neck fracture is also present. Pelvis is intact. IMPRESSION: Oblique subtrochanteric and cervical neck fractures of the right femur. Electronically Signed   By: Marin Roberts M.D.   On: 04/25/2022 13:41   DG Foot Complete Right  Result Date: 04/25/2022 CLINICAL DATA:  Right foot pain. Patient is paralyzed below the waist but heard a pop yesterday. EXAM: RIGHT FOOT COMPLETE - 3+ VIEW COMPARISON:  None Available. FINDINGS: Mild diffuse osteopenia is present. Degenerative changes are noted. Small plantar calcaneal spur is present. No acute osseous abnormality is present. IMPRESSION: 1. Mild diffuse osteopenia. 2. No acute osseous abnormality. Electronically Signed   By: Marin Roberts M.D.   On: 04/25/2022 11:07    Review of Systems  Constitutional: Negative.   HENT: Negative.    Eyes: Negative.   Respiratory: Negative.    Cardiovascular: Negative.   Gastrointestinal: Negative.   Musculoskeletal:        Right hip fracture  Neurological:        Paraplegia  Psychiatric/Behavioral: Negative.     Blood pressure 121/74, pulse (!) 101, temperature 98.1 F (36.7 C), resp. rate 16, SpO2 100 %. Physical Exam HENT:     Head: Normocephalic.     Mouth/Throat:     Mouth:  Mucous membranes are moist.  Eyes:     Extraocular Movements: Extraocular movements intact.  Cardiovascular:     Rate and Rhythm: Tachycardia present.  Pulmonary:     Effort: Pulmonary effort is normal.  Abdominal:     General: Abdomen is flat.  Musculoskeletal:     Cervical back: Neck supple.     Comments: There is swelling and deformity of the right hip.  No areas of tenderness to palpation.  No skin tenting.  No motor function of bilateral lower extremities.  No sensation of bilateral lower extremities.  No other areas of trauma or skin issues.  Skin:    General: Skin is warm.  Neurological:     Mental Status: He is alert. Mental status is at baseline.  Psychiatric:        Mood and Affect: Mood normal.     Assessment/Plan: Patient has had some strep trochanteric extension of his fracture.  There is no skin tenting or skin issues currently.  The patient does not have pain due to his insensate status.  We will continued conservative nonoperative treatment.  Plan is for closed treatment without manipulation of his proximal femur fracture.  We did discuss the possibility of later on bony resection if he does not heal the fractures appropriately or develops skin issues due to the displacement of the fractures.  We also discussed the possibility of Girdlestone type procedure in the future.  For now he will continue with nonoperative treatment.  He may sit in his wheelchair laying the bed whenever is comfortable.  He will plan to follow-up in my office for x-rays to ensure no interval displacement or complications arise from this course of treatment.  Terance Hart 04/25/2022, 6:11 PM

## 2022-04-25 NOTE — ED Provider Notes (Signed)
[facility] EMERGENCY DEPARTMENT Provider Note   CSN: [id number] Arrival date & time: 04/25/22  6256     History  Chief Complaint  Patient presents with   Foot Pain    John Meza is a 61 y.o. male with past medical history of T11 paraplegia after a fall from a ladder approximately 10 years ago who presents today for evaluation of increased pain and swelling in his right leg. He was admitted into the hospital from 03/19/2022 until 03/30/2022 for sepsis secondary to ESBL with possible myositis.  At that point on CT scans of his abdomen he was found to have a incidental finding of a right hip fracture. It was unclear how he got the fracture as he denied any falls, orthopedics saw and evaluated him and given his paraplegia and nonweightbearing status they recommended he continue conservative care and using his wheelchair, felt he was not a candidate for surgical intervention.  Patient presents today with his wife who is at bedside and provides additional history through Spanish speaking medical interpreter.  Reportedly yesterday patient was in the shower and moved his right leg when he felt another pop in the leg.  He feels like this is lower.  He states that since yesterday when this happened he has had increased spasms in his bilateral lower extremities.  He has not been having any fevers.   HPI     Home Medications Prior to Admission medications   Medication Sig Start Date End Date Taking? Authorizing Provider  atorvastatin (LIPITOR) 20 MG tablet Take 1 tablet (20 mg total) by mouth daily at 12 noon. 03/18/22 03/18/23 Yes Storm Frisk, MD  ferrous sulfate 325 (65 FE) MG tablet Take 1 tablet (325 mg total) by mouth daily. 03/18/22  Yes Storm Frisk, MD  Insulin Glargine [facility] KWIKPEN) 100 UNIT/ML Inject 25 Units into the skin at bedtime. 02/02/22 02/02/23 Yes Storm Frisk, MD  metFORMIN (GLUCOPHAGE) 1000 MG tablet TAKE 1 TABLET (1,000 MG  TOTAL) BY MOUTH 2 (TWO) TIMES DAILY WITH A MEAL. Patient taking differently: Take 1,000 mg by mouth 2 (two) times daily with a meal. 03/18/22 03/18/23 Yes Storm Frisk, MD  methocarbamol (ROBAXIN) 500 MG tablet Take 1 tablet (500 mg total) by mouth every 6 (six) hours as needed for muscle spasms. 11/03/21  Yes Storm Frisk, MD  omeprazole (PRILOSEC) 20 MG capsule Take 1 capsule by mouth daily to reduce stomach acid Patient taking differently: Take 20 mg by mouth daily as needed (acid reflux/indigestion). 11/03/21 11/03/22 Yes Storm Frisk, MD  sitaGLIPtin (JANUVIA) 100 MG tablet Take 1 tablet by mouth daily Patient taking differently: Take 100 mg by mouth daily. Take 1 tablet by mouth daily 03/18/22  Yes Storm Frisk, MD  valsartan-hydrochlorothiazide (DIOVAN-HCT) 320-25 MG tablet Take 1 tablet by mouth daily. 03/18/22  Yes Storm Frisk, MD  Insulin Pen Needle (TECHLITE PEN NEEDLES) 32G X 4 MM MISC Use as directed to inject insulin 03/18/22   Storm Frisk, MD  loratadine (CLARITIN) 10 MG tablet Take 1 tablet (10 mg total) by mouth daily. As needed for itchy throat/allergy symptoms Patient not taking: Reported on 03/24/2019 12/26/18 07/05/20  Cain Saupe, MD      Allergies    Macrobid [nitrofurantoin]      Physical Exam Updated Vital Signs BP 121/75 (BP Location: Left Arm)   Pulse (!) 106   Temp 99.4 F (37.4 C) (Oral)   Resp 17   SpO2  100%  Physical Exam Vitals and nursing note reviewed.  Constitutional:      General: He is not in acute distress.    Appearance: He is not diaphoretic.  HENT:     Head: Normocephalic and atraumatic.  Eyes:     General: No scleral icterus.       Right eye: No discharge.        Left eye: No discharge.     Conjunctiva/sclera: Conjunctivae normal.  Cardiovascular:     Rate and Rhythm: Normal rate and regular rhythm.     Comments: 2+ DP/PT pulses bilaterally. Pulmonary:     Effort: Pulmonary effort is normal. No respiratory distress.      Breath sounds: No stridor.  Abdominal:     General: There is no distension.  Musculoskeletal:     Cervical back: Normal range of motion.     Comments: Patient has edema over the right proximal thigh.  Compartments in the right leg are soft and easily compressible.  He has atrophy of bilateral lower extremities consistent with paraplegic state. There is obvious noted muscle spasm primarily in bilateral calfs.     Skin:    General: Skin is warm and dry.  Neurological:     Mental Status: He is alert.     Motor: No abnormal muscle tone.     Comments: Patient is at baseline according to patient and his wife.  He is paraplegic, does not have intentional movement of bilateral lower extremities and does not have sensation to his bilateral lower extremities. He is awake and alert, answers questions through Spanish interpreter without difficulty.  Psychiatric:        Behavior: Behavior normal.      ED Results / Procedures / Treatments   Labs (all labs ordered are listed, but only abnormal results are displayed) Labs Reviewed - No data to display  EKG None  Radiology DG Chest 1 View  Result Date: 04/25/2022 CLINICAL DATA:  Paralysis from waist down EXAM: CHEST  1 VIEW COMPARISON:  March 19, 2022 FINDINGS: The heart size and mediastinal contours are within normal limits. Both lungs are clear. The visualized skeletal structures are unremarkable. IMPRESSION: No active disease. Electronically Signed   By: Gerome Sam III M.D.   On: 04/25/2022 13:45   DG Lumbar Spine 2-3 Views  Result Date: 04/25/2022 CLINICAL DATA:  Fracture in lower extremity EXAM: LUMBAR SPINE - 2-3 VIEW COMPARISON:  None Available. FINDINGS: Pedicle rods and screws are seen at T10, T12, and L1. The pedicle rods and screws span a T11 vertebral body fracture which is unchanged since October 25, 2012. No acute fractures or malalignment. Multilevel degenerative disc disease. Lower lumbar facet degenerative changes.  IMPRESSION: No acute fracture or malalignment. Surgical hardware remains from T10 through L1. The T11 vertebral body fracture identified in January 2014 is stable. Electronically Signed   By: Gerome Sam III M.D.   On: 04/25/2022 13:44   DG Femur Min 2 Views Right  Result Date: 04/25/2022 CLINICAL DATA:  Hip fracture EXAM: RIGHT FEMUR 2 VIEWS COMPARISON:  None Available. FINDINGS: There is a displaced fracture through the proximal femoral diaphysis. There is a fracture through the femoral neck as well. No dislocation of the femoral head identified on provided images. No other abnormalities. IMPRESSION: Displaced fractures through the femoral neck and the proximal femoral diaphysis. Electronically Signed   By: Gerome Sam III M.D.   On: 04/25/2022 13:43   DG Tibia/Fibula Right  Result Date: 04/25/2022 CLINICAL  DATA:  Recent hip fracture.  Paraplegic. EXAM: RIGHT TIBIA AND FIBULA - 2 VIEW COMPARISON:  None Available. FINDINGS: There is no evidence of fracture or other focal bone lesions. Soft tissues are unremarkable. IMPRESSION: Negative. Electronically Signed   By: Gerome Sam III M.D.   On: 04/25/2022 13:41   DG Pelvis 1-2 Views  Result Date: 04/25/2022 CLINICAL DATA:  Patient heard a pop from his right lower extremity. In patient is paralyzed below the waist. EXAM: PELVIS - 1-2 VIEW COMPARISON:  Pelvis radiographs 05/14/2021. FINDINGS: Oblique subtrochanteric fracture noted. A cervical neck fracture is also present. Pelvis is intact. IMPRESSION: Oblique subtrochanteric and cervical neck fractures of the right femur. Electronically Signed   By: Marin Roberts M.D.   On: 04/25/2022 13:41   DG Foot Complete Right  Result Date: 04/25/2022 CLINICAL DATA:  Right foot pain. Patient is paralyzed below the waist but heard a pop yesterday. EXAM: RIGHT FOOT COMPLETE - 3+ VIEW COMPARISON:  None Available. FINDINGS: Mild diffuse osteopenia is present. Degenerative changes are noted. Small  plantar calcaneal spur is present. No acute osseous abnormality is present. IMPRESSION: 1. Mild diffuse osteopenia. 2. No acute osseous abnormality. Electronically Signed   By: Marin Roberts M.D.   On: 04/25/2022 11:07    Procedures Procedures    Medications Ordered in ED Medications - No data to display  ED Course/ Medical Decision Making/ A&P Clinical Course as of 04/26/22 2144  Sat Apr 25, 2022  1505 I have not heard back from orthopedics. I asked secretary to repaged orthopedics. [EH]  1534 Secretary repaged ortho for the third time [EH]  1544 Call from Dr. Susa Simmonds, He will contact dr Roda Shutters to discuss patient.  I asked him to close the loop and call me back with plan, he states he will.  [EH]  1649 Dr. Susa Simmonds of orthopedics called back.  He states that plan will be continued to be nonoperative. I asked him to come see and evaluate the patient and discussed this with him and his wife who is at bedside. [EH]    Clinical Course User Index [EH] Cristina Gong, PA-C                           Medical Decision Making Patient is a 61 year old Spanish-speaking man who arrives today for evaluation after yesterday he felt a pop in his leg.  He recently had an incidental finding of a femoral fracture when he was admitted but he states this is different and feels lower.    Amount and/or Complexity of Data Reviewed Radiology: ordered.   This patient presents to the ED for concern of feeling a pop in his right leg, this involves an extensive number of treatment options, and is a complaint that carries with it a high risk of complications and morbidity.  The differential diagnosis includes dislocation, fracture, soft tissue injury, wound, worried well   Co morbidities that complicate the patient evaluation  Left femoral neck fracture, paraplegia as his lack of sensation made exam difficult   Additional history obtained:  Additional history obtained from patient's wife at bedside.   She is his primary caregiver. External records from outside source obtained and reviewed including consult note from orthopedics when patient was admitted and had incidental right femoral neck fracture found on CT scans recent notes regarding his IV status     Imaging Studies ordered:  I ordered imaging studies including L-spine, 1 view chest, right  tib-fib, right femur, pelvis, and right foot I independently visualized and interpreted imaging which showed a right femoral neck fracture with a new oblique subtrochanteric fracture of the right femur   Consultations Obtained:  I requested consultation with the on-call orthopedist Dr. Susa Simmonds. He personally came and evaluated patient. Given that patient is paraplegic and nonweightbearing at baseline plan is to continue nonweightbearing status, allow healing and nonoperative.  Plan for outpatient follow-up.   Problem List / ED Course / Critical interventions / Medication management  Right subtrochanteric fracture Patient did not have any pain due to his paraplegic state and prior spinal cord injury.  He is already on treatment for muscle spasms. I have reviewed the patients home medicines and have made adjustments as needed   Social Determinants of Health:  Lack of insurance, language barrier,   Test / Admission - Considered:  Admission is considered, however as patient is nonoperative plan and with his prior spinal cord injury does not have sensation in this area he does not require admission for surgery nor pain control, he is already nonambulatory at baseline therefore no need for emergent PT or OT consult.  He does not appear to meet admission criteria at this time. I discussed with patient and his wife the importance of close outpatient/PCP follow-up for further evaluation and to determine if he may have a underlying metabolic or other cause for why he is having fractures so easily. Return precautions were discussed with the patient  and his wife at bedside.  I provided education to them through Spanish interpreter on evaluation of vascular function. Return precautions were discussed with patient Denton Lank who states their understanding.  At the time of discharge patient denied any unaddressed complaints or concerns.  Patient is agreeable for discharge home.  Note: Portions of this report may have been transcribed using voice recognition software. Every effort was made to ensure accuracy; however, inadvertent computerized transcription errors may be present  Final Clinical Impression(s) / ED Diagnoses Final diagnoses:  Other closed fracture of right femur, unspecified portion of femur, initial encounter (HCC)  Paraplegia [facility])    Rx / DC Orders ED Discharge Orders     None         Norman Clay 04/26/22 2151    Jacalyn Lefevre, MD 05/04/22 [phone]

## 2022-04-28 ENCOUNTER — Emergency Department (HOSPITAL_COMMUNITY)
Admission: EM | Admit: 2022-04-28 | Discharge: 2022-04-29 | Disposition: A | Discharge status: Home / Self Care | Payer: No Typology Code available for payment source | Attending: Emergency Medicine | Admitting: Emergency Medicine

## 2022-04-28 ENCOUNTER — Ambulatory Visit: Payer: Self-pay

## 2022-04-28 ENCOUNTER — Telehealth: Payer: Self-pay

## 2022-04-28 ENCOUNTER — Emergency Department (HOSPITAL_BASED_OUTPATIENT_CLINIC_OR_DEPARTMENT_OTHER): Payer: No Typology Code available for payment source

## 2022-04-28 DIAGNOSIS — Z794 Long term (current) use of insulin: Secondary | ICD-10-CM | POA: Insufficient documentation

## 2022-04-28 DIAGNOSIS — M79604 Pain in right leg: Secondary | ICD-10-CM | POA: Insufficient documentation

## 2022-04-28 DIAGNOSIS — M7989 Other specified soft tissue disorders: Secondary | ICD-10-CM | POA: Insufficient documentation

## 2022-04-28 DIAGNOSIS — E871 Hypo-osmolality and hyponatremia: Secondary | ICD-10-CM | POA: Insufficient documentation

## 2022-04-28 DIAGNOSIS — R52 Pain, unspecified: Secondary | ICD-10-CM

## 2022-04-28 LAB — BASIC METABOLIC PANEL
Anion gap: 9 (ref 5–15)
BUN: 15 mg/dL (ref 6–20)
CO2: 20 mmol/L — ABNORMAL LOW (ref 22–32)
Calcium: 8.3 mg/dL — ABNORMAL LOW (ref 8.9–10.3)
Chloride: 100 mmol/L (ref 98–111)
Creatinine, Ser: 0.83 mg/dL (ref 0.61–1.24)
GFR, Estimated: >60 mL/min (ref >60)
Glucose, Bld: 199 mg/dL — ABNORMAL HIGH (ref 70–99)
Potassium: 4.7 mmol/L (ref 3.5–5.1)
Sodium: 129 mmol/L — ABNORMAL LOW (ref 135–145)

## 2022-04-28 LAB — CBC
HCT: 22.8 % — ABNORMAL LOW (ref 39.0–52.0)
Hemoglobin: 7 g/dL — ABNORMAL LOW (ref 13.0–17.0)
MCH: 25.7 pg — ABNORMAL LOW (ref 26.0–34.0)
MCHC: 30.7 g/dL (ref 30.0–36.0)
MCV: 83.8 fL (ref 80.0–100.0)
Platelets: 468 10*3/uL — ABNORMAL HIGH (ref 150–400)
RBC: 2.72 MIL/uL — ABNORMAL LOW (ref 4.22–5.81)
RDW: 15.8 % — ABNORMAL HIGH (ref 11.5–15.5)
WBC: 7.8 10*3/uL (ref 4.0–10.5)
nRBC: 0 % (ref 0.0–0.2)

## 2022-04-28 NOTE — Telephone Encounter (Signed)
  Chief Complaint: right thigh swelling Symptoms: none Frequency: since Saturday Pertinent Negatives: Patient redness. Pt is paralyzed from waist down Disposition: [x] ED /[] Urgent Care (no appt availability in office) / [] Appointment(In office/virtual)/ []  [facility]/ [] Home Care/ [] Refused Recommended Disposition /[] [facility]/ []  Follow-up with PCP Additional Notes: Advised pt's wife to take pt to ED for evaluation to r/o DVT.     Reason for Disposition  [1] Thigh, calf, or ankle swelling AND [2] only 1 side  Answer Assessment - Initial Assessment Questions 1. ONSET: "When did the swelling start?" (e.g., minutes, hours, days)     Saturday 2. LOCATION: "What part of the leg is swollen?"  "Are both legs swollen or just one leg?"     Upper thigh right  3. SEVERITY: "How bad is the swelling?" (e.g., localized; mild, moderate, severe)   - Localized: Small area of swelling localized to one leg.   - MILD pedal edema: Swelling limited to foot and ankle, pitting edema < 1/4 inch (6 mm) deep, rest and elevation eliminate most or all swelling.   - MODERATE edema: Swelling of lower leg to knee, pitting edema > 1/4 inch (6 mm) deep, rest and elevation only partially reduce swelling.   - SEVERE edema: Swelling extends above knee, facial or hand swelling present.      severe 4. REDNESS: "Does the swelling look red or infected?"     no 5. PAIN: "Is the swelling painful to touch?" If Yes, ask: "How painful is it?"   (Scale 1-10; mild, moderate or severe)     no 6. FEVER: "Do you have a fever?" If Yes, ask: "What is it, how was it measured, and when did it start?"      no 7. CAUSE: "What do you think is causing the leg swelling?"     Unsure (fx in June to right femur) 8. MEDICAL HISTORY: "Do you have a history of blood clots (e.g., DVT), cancer, heart failure, kidney disease, or liver failure?"     no 9. RECURRENT SYMPTOM: "Have you had leg swelling before?" If Yes, ask:  "When was the last time?" "What happened that time?"     no 10. OTHER SYMPTOMS: "Do you have any other symptoms?" (e.g., chest pain, difficulty breathing)       no  Protocols used: Leg Swelling and Edema-A-AH

## 2022-04-28 NOTE — Progress Notes (Signed)
Right LE venous duplex study completed. Please see CV Proc for preliminary results.  Bayan Hedstrom BS, RVT 04/28/2022 7:25 PM

## 2022-04-28 NOTE — ED Triage Notes (Signed)
Pt c/o potential DVT in R leg. Recent fx to R femur in June. Pt noticed increased swelling Saturday. Denies redness, fever, pain, SHOB, CP. Wheelchair at baseline

## 2022-04-28 NOTE — Telephone Encounter (Signed)
RNCM spoke with patient's daughter who advised patient was referred for orthopedic appointment. Patient's daughter questioning which providers will take the patient's orange card. This RNCM advised to call the phone number on orange card for assistance with locating inn provider.   No additional TOC needs at this time.

## 2022-04-28 NOTE — ED Provider Triage Note (Signed)
Emergency Medicine Provider Triage Evaluation Note  John Meza , a 61 y.o. male  was evaluated in triage.  Pt complains of right leg swelling. Paraplegic at baseline. Had a femur fracture in June and a hip fracture on Friday. Noticed worsening swelling since Saturday. Was sent here for DVT rule out. Patient denies and fevers, chills, chest pain, or shortness of breath.  Review of Systems  Positive:  Negative:   Physical Exam  BP 129/75 (BP Location: Right Arm)   Pulse (!) 108   Temp 98.7 F (37.1 C) (Oral)   Resp 16   SpO2 100%  Gen:   Awake, no distress   Resp:  Normal effort  MSK:   Moves extremities without difficulty  Other:  Trace right leg swelling. No erythema or warmth. DP and PT pulses intact and 2+  Medical Decision Making  Medically screening exam initiated at 6:57 PM.  Appropriate orders placed.  John Meza was informed that the remainder of the evaluation will be completed by another provider, this initial triage assessment does not replace that evaluation, and the importance of remaining in the ED until their evaluation is complete.     Silva Bandy, PA-C 04/28/22 1900

## 2022-04-28 NOTE — Telephone Encounter (Signed)
Called pt and spoke with wife and she is taking him to the ED to be evaluation  Interpreter ID# [place] [phone]

## 2022-04-29 ENCOUNTER — Emergency Department (HOSPITAL_COMMUNITY): Payer: No Typology Code available for payment source

## 2022-04-29 LAB — CBC WITH DIFFERENTIAL/PLATELET
Abs Immature Granulocytes: 0.07 10*3/uL (ref 0.00–0.07)
Basophils Absolute: 0.1 10*3/uL (ref 0.0–0.1)
Basophils Relative: 1 %
Eosinophils Absolute: 0.7 10*3/uL — ABNORMAL HIGH (ref 0.0–0.5)
Eosinophils Relative: 7 %
HCT: 24.7 % — ABNORMAL LOW (ref 39.0–52.0)
Hemoglobin: 7.7 g/dL — ABNORMAL LOW (ref 13.0–17.0)
Immature Granulocytes: 1 %
Lymphocytes Relative: 20 %
Lymphs Abs: 1.8 10*3/uL (ref 0.7–4.0)
MCH: 25.6 pg — ABNORMAL LOW (ref 26.0–34.0)
MCHC: 31.2 g/dL (ref 30.0–36.0)
MCV: 82.1 fL (ref 80.0–100.0)
Monocytes Absolute: 0.8 10*3/uL (ref 0.1–1.0)
Monocytes Relative: 10 %
Neutro Abs: 5.5 10*3/uL (ref 1.7–7.7)
Neutrophils Relative %: 61 %
Platelets: 561 10*3/uL — ABNORMAL HIGH (ref 150–400)
RBC: 3.01 MIL/uL — ABNORMAL LOW (ref 4.22–5.81)
RDW: 15.9 % — ABNORMAL HIGH (ref 11.5–15.5)
WBC: 8.9 10*3/uL (ref 4.0–10.5)
nRBC: 0 % (ref 0.0–0.2)

## 2022-04-29 LAB — TYPE AND SCREEN
ABO/RH(D): O POS
Antibody Screen: NEGATIVE

## 2022-04-29 MED ORDER — SODIUM CHLORIDE 0.9 % IV BOLUS
1000.0000 mL | Freq: Once | INTRAVENOUS | Status: AC
  Administered 2022-04-29: 1000 mL via INTRAVENOUS

## 2022-04-29 NOTE — ED Provider Notes (Signed)
[facility] EMERGENCY DEPARTMENT Provider Note   CSN: [id number] Arrival date & time: 04/28/22  1818     History  Chief Complaint  Patient presents with   Leg Pain    John Meza is a 61 y.o. male.  61 yo M with a chief complaint of right leg swelling.  He called the nursing helpline and they suggested he come here to be evaluated for DVT.  Patient had unfortunately suffered a fracture of that right hip, that had occurred about 2 weeks ago, he is being managed nonoperatively as he is paraplegic and nonambulatory at baseline.  Otherwise feels like he has been eating and drinking normally.  He denies any dark stool or blood in his stool.  Denies any bleeding.  He denies alcohol use.   Leg Pain      Home Medications Prior to Admission medications   Medication Sig Start Date End Date Taking? Authorizing Provider  atorvastatin (LIPITOR) 20 MG tablet Take 1 tablet (20 mg total) by mouth daily at 12 noon. 03/18/22 03/18/23  Storm Frisk, MD  ferrous sulfate 325 (65 FE) MG tablet Take 1 tablet (325 mg total) by mouth daily. 03/18/22   Storm Frisk, MD  Insulin Glargine [facility] KWIKPEN) 100 UNIT/ML Inject 25 Units into the skin at bedtime. 02/02/22 02/02/23  Storm Frisk, MD  Insulin Pen Needle (TECHLITE PEN NEEDLES) 32G X 4 MM MISC Use as directed to inject insulin 03/18/22   Storm Frisk, MD  metFORMIN (GLUCOPHAGE) 1000 MG tablet TAKE 1 TABLET (1,000 MG TOTAL) BY MOUTH 2 (TWO) TIMES DAILY WITH A MEAL. Patient taking differently: Take 1,000 mg by mouth 2 (two) times daily with a meal. 03/18/22 03/18/23  Storm Frisk, MD  methocarbamol (ROBAXIN) 500 MG tablet Take 1 tablet (500 mg total) by mouth every 6 (six) hours as needed for muscle spasms. 11/03/21   Storm Frisk, MD  omeprazole (PRILOSEC) 20 MG capsule Take 1 capsule by mouth daily to reduce stomach acid Patient taking differently: Take 20 mg by mouth daily as needed (acid  reflux/indigestion). 11/03/21 11/03/22  Storm Frisk, MD  sitaGLIPtin (JANUVIA) 100 MG tablet Take 1 tablet by mouth daily Patient taking differently: Take 100 mg by mouth daily. Take 1 tablet by mouth daily 03/18/22   Storm Frisk, MD  valsartan-hydrochlorothiazide (DIOVAN-HCT) 320-25 MG tablet Take 1 tablet by mouth daily. 03/18/22   Storm Frisk, MD  loratadine (CLARITIN) 10 MG tablet Take 1 tablet (10 mg total) by mouth daily. As needed for itchy throat/allergy symptoms Patient not taking: Reported on 03/24/2019 12/26/18 07/05/20  Cain Saupe, MD      Allergies    Macrobid [nitrofurantoin]    Review of Systems   Review of Systems  Physical Exam Updated Vital Signs BP 111/66   Pulse 96   Temp 98.1 F (36.7 C)   Resp (!) 30   SpO2 100%  Physical Exam Vitals and nursing note reviewed.  Constitutional:      Appearance: He is well-developed.  HENT:     Head: Normocephalic and atraumatic.  Eyes:     Pupils: Pupils are equal, round, and reactive to light.  Neck:     Vascular: No JVD.  Cardiovascular:     Rate and Rhythm: Normal rate and regular rhythm.     Heart sounds: No murmur heard.    No friction rub. No gallop.  Pulmonary:     Effort: No respiratory distress.  Breath sounds: No wheezing.  Abdominal:     General: There is no distension.     Tenderness: There is no abdominal tenderness. There is no guarding or rebound.  Musculoskeletal:        General: Swelling and tenderness present. Normal range of motion.     Cervical back: Normal range of motion and neck supple.     Comments: Intact pulse to the right lower extremity  Skin:    Coloration: Skin is not pale.     Findings: No rash.  Neurological:     Mental Status: He is alert and oriented to person, place, and time.  Psychiatric:        Behavior: Behavior normal.     ED Results / Procedures / Treatments   Labs (all labs ordered are listed, but only abnormal results are displayed) Labs Reviewed   CBC - Abnormal; Notable for the following components:      Result Value   RBC 2.72 (*)    Hemoglobin 7.0 (*)    HCT 22.8 (*)    MCH 25.7 (*)    RDW 15.8 (*)    Platelets 468 (*)    All other components within normal limits  BASIC METABOLIC PANEL - Abnormal; Notable for the following components:   Sodium 129 (*)    CO2 20 (*)    Glucose, Bld 199 (*)    Calcium 8.3 (*)    All other components within normal limits  CBC WITH DIFFERENTIAL/PLATELET - Abnormal; Notable for the following components:   RBC 3.01 (*)    Hemoglobin 7.7 (*)    HCT 24.7 (*)    MCH 25.6 (*)    RDW 15.9 (*)    Platelets 561 (*)    Eosinophils Absolute 0.7 (*)    All other components within normal limits  TYPE AND SCREEN    EKG None  Radiology DG Pelvis 1-2 Views  Result Date: 04/29/2022 CLINICAL DATA:  Right hip pain after fall. EXAM: PELVIS - 1-2 VIEW COMPARISON:  April 25, 2022. FINDINGS: Severely displaced and comminuted fracture is seen involving the intertrochanteric region of the proximal right femur. Left hip is unremarkable. IMPRESSION: Severely displaced and comminuted intertrochanteric fracture of proximal right femur. Electronically Signed   By: Lupita Raider M.D.   On: 04/29/2022 11:47   DG Femur Min 2 Views Right  Result Date: 04/29/2022 CLINICAL DATA:  Right hip pain and swelling after fall. EXAM: RIGHT FEMUR 2 VIEWS COMPARISON:  None Available. FINDINGS: Severely comminuted and displaced fracture is seen involving the intertrochanteric region of the proximal right femur. IMPRESSION: Severely displaced and comminuted intertrochanteric fracture of proximal right femur. Electronically Signed   By: Lupita Raider M.D.   On: 04/29/2022 11:46   VAS [place] LOWER EXTREMITY VENOUS (DVT) (7a-7p)  Result Date: 04/29/2022  Lower Venous DVT Study Patient Name:  John Meza  Date of Exam:   04/28/2022 Medical Rec #: [id number]                   Accession #:    [id number] Date of Birth:  1961-09-07                   Patient Gender: M Patient Age:   10 years Exam Location:  [facility] Procedure:      VAS [place] LOWER EXTREMITY VENOUS (DVT) Referring Phys: [facility] SMOOT --------------------------------------------------------------------------------  Indications: Swelling, and Pain.  Comparison Study: No previous exam noted. Performing Technologist:  Jennifer Redd BS, RVT  Examination Guidelines: A complete evaluation includes B-mode imaging, spectral Doppler, color Doppler, and power Doppler as needed of all accessible portions of each vessel. Bilateral testing is considered an integral part of a complete examination. Limited examinations for reoccurring indications may be performed as noted. The reflux portion of the exam is performed with the patient in reverse Trendelenburg.  +---------+---------------+---------+-----------+----------+--------------+ RIGHT    CompressibilityPhasicitySpontaneityPropertiesThrombus Aging +---------+---------------+---------+-----------+----------+--------------+ CFV      Full           Yes      Yes                                 +---------+---------------+---------+-----------+----------+--------------+ SFJ      Full                                                        +---------+---------------+---------+-----------+----------+--------------+ FV Prox  Full                                                        +---------+---------------+---------+-----------+----------+--------------+ FV Mid   Full                                                        +---------+---------------+---------+-----------+----------+--------------+ FV DistalFull                                                        +---------+---------------+---------+-----------+----------+--------------+ PFV      Full                                                        +---------+---------------+---------+-----------+----------+--------------+  POP      Full           Yes      Yes                                 +---------+---------------+---------+-----------+----------+--------------+ PTV      Full                                                        +---------+---------------+---------+-----------+----------+--------------+ PERO     Full                                                        +---------+---------------+---------+-----------+----------+--------------+   +----+---------------+---------+-----------+----------+--------------+  LEFTCompressibilityPhasicitySpontaneityPropertiesThrombus Aging +----+---------------+---------+-----------+----------+--------------+ CFV Full           Yes      Yes                                 +----+---------------+---------+-----------+----------+--------------+     Summary: RIGHT: - No evidence of deep vein thrombosis in the lower extremity. No indirect evidence of obstruction proximal to the inguinal ligament. - No cystic structure found in the popliteal fossa.  LEFT: - No evidence of common femoral vein obstruction.  *See table(s) above for measurements and observations. Electronically signed by Waverly Ferrari MD on 04/29/2022 at 10:02:29 AM.    Final     Procedures Procedures    Medications Ordered in ED Medications  sodium chloride 0.9 % bolus 1,000 mL (0 mLs Intravenous Stopped 04/29/22 1211)    ED Course/ Medical Decision Making/ A&P                           Medical Decision Making Amount and/or Complexity of Data Reviewed Labs: ordered. Radiology: ordered.   61 yo M with a chief complaints of right thigh swelling.  This has been going on for couple days.  Patient is a paraplegic, on my record review had unfortunately been hospitalized for an infectious cause but was found to have an incidental right hip fracture.  This is being managed nonoperatively.  I suspect the patient likely has a hematoma there.  His DVT study is negative.  His  hemoglobin has dropped over a gram since last check.  It took him about 12 hours to be seen after his lab work was drawn.  I will recheck a CBC to assess for change.  Bolus of IV fluids with his hyponatremia and mild change to his renal function.  Plain film of the right hip to assess for change.  Plain film of the right hip independently interpreted by me unchanged from last x-ray in our system.  Repeat hemoglobin is actually improved closer to his baseline.  He continues not to have complaints other than his leg is swollen.  At this point we will discharge home.  Have him follow-up with his PCP and orthopedist.  12:14 PM:  I have discussed the diagnosis/risks/treatment options with the patient and family.  Evaluation and diagnostic testing in the emergency department does not suggest an emergent condition requiring admission or immediate intervention beyond what has been performed at this time.  They will follow up with  PCP. We also discussed returning to the ED immediately if new or worsening sx occur. We discussed the sx which are most concerning (e.g., sudden worsening pain, fever, inability to tolerate by mouth) that necessitate immediate return. Medications administered to the patient during their visit and any new prescriptions provided to the patient are listed below.  Medications given during this visit Medications  sodium chloride 0.9 % bolus 1,000 mL (0 mLs Intravenous Stopped 04/29/22 1211)     The patient appears reasonably screen and/or stabilized for discharge and I doubt any other medical condition or other [facility] requiring further screening, evaluation, or treatment in the ED at this time prior to discharge.          Final Clinical Impression(s) / ED Diagnoses Final diagnoses:  Leg swelling    Rx / DC Orders ED Discharge Orders     None  Melene Plan, DO 04/29/22 1214

## 2022-04-29 NOTE — Discharge Instructions (Signed)
Follow up with your family doc.  Return for worsening or continued swelling.

## 2022-04-30 ENCOUNTER — Telehealth (HOSPITAL_COMMUNITY): Payer: Self-pay

## 2022-04-30 NOTE — Telephone Encounter (Signed)
Attempted to reach [name] using the translator line. Call went to voicemail and voicemail box has not yet been set up. I will continue to reach out to establish paramedicine.   Maralyn Sago, EMT-Paramedic [phone] 04/30/2022

## 2022-05-12 ENCOUNTER — Telehealth (HOSPITAL_COMMUNITY): Payer: Self-pay

## 2022-05-12 NOTE — Telephone Encounter (Signed)
Attempted to reach [facility] through using the interpretor line without success. I will plan to meet him in the clinic on 05/19/22 when he sees Dr. [name] to be introduced and to move forward with paramedicine. I will route to [facility] CM, to make her aware.   Maralyn Sago, EMT-Paramedic [phone] 05/12/2022

## 2022-05-14 ENCOUNTER — Ambulatory Visit: Payer: No Typology Code available for payment source | Admitting: Physician Assistant

## 2022-05-18 NOTE — Progress Notes (Signed)
Established Patient Office Visit  Subjective   Patient ID: John Meza, male    DOB: Sep 28, 1961  Age: 61 y.o. MRN: [id number]  No chief complaint on file.   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  He has had sacral decubiti which have now resolved.  He still has his original wheelchair and is not in good working order and needs a new wheelchair.  We attempted to order this 6 months ago but there was no processing of the order as he is uninsured.  Note the patient did undergo an eye exam on 14 January at the [facility] and it was negative for diabetic retinopathy.  His A1c is 8.0.  His blood sugars at home of been in the 113 130 range.  Blood sugar today is 83.  He does need lab screenings today including liver function renal function.  The patient has no other real complaints except he has occasional back spasms when he lays flat in the bed.  On arrival blood pressure is good 130/80.  4/24 Patient returns in follow-up and now has a new wheelchair with adequate padding.  Sacral decubiti this was provided with the patient assistance line.  This visit was assisted by [place] video interpreter Aram Beecham [phone].  On arrival blood sugar is 97 and he brings his glucometer readings which range from 160-98 with an average of about 130.  Unfortunately despite this his A1c is 8.3 so he clearly has glycemic episodes at times.  He has been maintaining Sitagliptin and metformin along with insulin glargine.  Patient does need refills on medications.  Blood pressure is excellent on arrival 138/74.  Patient does complain of some blood in his urine when he self catheterizes himself he does have follow-up appointment with urology upcoming.  Patient has no other real complaints.  6/7 Post hosp  TOC This patient was seen in return follow-up and is a transition of care visit.  He was hospitalized in May with urosepsis and extended-spectrum bacterial beta-lactamase infection E. Coli  Patient received a full 2-week course of intravenous therapy and had his midline catheter removed this week.  Venous Doppler ultrasound of the upper extremity on the right was normal.  Patient currently states he is improving and has no real specific complaints.  He is paraplegic he does self catheterize.  He follows with urology.  The patient does not have any insurance.  He has been using the same 3 catheters on a cyclic basis and does self catheterize 3 times daily.  He is using soap and water to clean the catheters.  He got a fresh set of 4 catheters about a week ago at urology and they have asked to reuse those for the entire year.  He does not have insurance to purchase catheter supplies.  He is due a urine microalbumin study.  Patient does have diabetes and is taking his current medications.  Patient is on the metformin at 1000 mg twice a day and insulin Basaglar 24 units daily atorvastatin 20 mg daily valsartan HCT 320/25 daily and Januvia daily  Patient states that he is accessing his medications.  Blood sugar today on arrival is slightly elevated There are no other complaints.  Below is a copy of the discharge summary and also the infectious disease note this week, there was a transition of care  visit from the RN that occurred May 26  7/11 This patient is seen in follow-up from his second hospitalization which occurred after I last saw him in early June.  Below is documentation of that hospitalization.  Note this patient will be seeing infectious disease later this morning.  The patient has been reusing his Foley catheters for self-catheterization.  He does need urology follow-up with Dr. [name].  Patient needs an in office cystoscopy according to the urologist who saw the patient as an inpatient.  He  also had bacteremia with an ESBL organism which seeded the muscles of the retroperitoneal area.  He received prolonged course of intravenous antibiotics which is just been completed.  He still has a PICC line in the right upper arm.  Below is a copy of the discharge summary Pt readmitted 6/8: Admit date: 03/19/2022 Discharge date: 03/30/2022  Admitted From: Home Disposition:  Home   Recommendations for Outpatient Follow-up:  1. Follow up with PCP in 1-2 weeks 2. Follow-up with ID as an outpatient     Infectious disease recommendation for antibiotics: Discharge antibiotics to be given via PICC line Discharge antibiotics: Per pharmacy protocolmeropene Duration: 4 wk End Date: 04/16/22 [facility] Care Per Protocol: Home health RN for IV administration and teaching; PICC line care and labs.  Labs weekly while on IV antibiotics: _x_ CBC with differential __xBMP _x_ Please pull PIC at completion of IV antibiotics   Fax weekly labs to [phone]  Clinic Follow Up Appt:04-21-22 10:00 with Dr [name]   @[facility]     Discharge Condition:Stable CODE STATUS:FULL Diet recommendation: Heart Healthy / Carb Modified   Brief/Interim Summary: John Alm Garcia-Moralesis a 61 y.o.malewithhistory of diabetes mellitus type 2, hypertension, hyperlipidemia, chronic anemia, paraplegia with neurogenic bladder uses self cath was recently admitted for ESBL E. coli bacteremia was recently on IV antibiotics. He presents to ER hypotensive with systolic blood pressure in the 80s with fever leukocytosis concerning for sepsis.  -His work-up significant for recurrent bacteremia, thought to be secondary to urinary source, as well MRI spine significant for myositis, TEE is negative for endocarditis.  Severe sepsis secondary to recurrent ESBL bacteremia , and myositis - MRI of spine showing paraspinal myositis,(no discitis or epidural abscess). -ID input greatly appreciated, for now  continue with IV meropenem. will DC on IV meropenem for total of 4 weeks treatment, with end date 04/16/2022 -2D echo with no evidence of vegetation, TEE with no evidence of endocarditis - previous MD D/W Dr. [name] 6/9 --recommends treatment of urinary infection nonemergent cystoscopy in office and outpatient follow-upwith Dr. [name] of urology as an outpatient-he will set this up at this follow-up and family is aware to calland confirm -PICC line inserted 6/15. -Discussed with patient/wife with telemetry interpreter, that patient cannot REuse his in and out catheters, and they are meant for single use, as reusing may be causing his infection.  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 - Routine management and follow-up   Incidental finding of right hip fracture on imaging upon admission -Input greatly appreciated, not a surgical candidate given he is paraplegic and wheelchair dependent at baseline and insensate and right hip  Diabetes mellitus, type II -Resume home regimen     Discharge Diagnoses:  Active Problems:   HLD (hyperlipidemia)   Paraplegia (HCC)  Neurogenic bladder   Insulin dependent type 2 diabetes mellitus, controlled ([place])   Essential hypertension   Lower urinary tract infectious disease   Sepsis ([place])   In the interim we have been able to secure a supply of clean single use Foley catheters for self-catheterization through a patient assistance fund.  The visit today is accomplished with Spanish video interpreter Genesis [phone]  Patient states he has no back pain or other symptoms at this time.  The wife states there is no outstanding issues.  On arrival blood sugar was only mildly elevated and blood pressure is good 121/77.  Patient does  need lab follow-ups.  8/8  Cardiovascular and Mediastinum  Essential hypertension   Blood pressure at goal at this visit will not make any changes in current medication program     Endocrine  Insulin dependent type 2 diabetes mellitus, controlled ([place]) - Primary   Blood sugars are in good range will not make changes in medications at this time  We will send a blood draw for A1c    Relevant Orders  POCT glucose (manual entry) (Completed)  Comprehensive metabolic panel  Lipid panel  Hemoglobin A1c   Musculoskeletal and Integument  Closed fracture of neck of right femur ([place])   Patient is nonweightbearing have elected to observe this for now    Myositis   Appears to have resolved based on exam has follow-up with infectious disease later this afternoon     Genitourinary  Pyelonephritis due to Escherichia coli   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     Other  HLD (hyperlipidemia)   Reassess lipids    Hyponatremia   Reassess metabolic panel    Bacteremia   Appears to have cleared follow-up per infectious disease    Relevant Orders  CBC with Differential/Platelet  ESBL (extended spectrum beta-lactamase) producing bacteria infection   As per infectious disease    Medication monitoring encounter   As per infectious disease    PICC (peripherally inserted central catheter) in place   We will leave in place until seen by infectious disease    RESOLVED: Sepsis ([place])   Sepsis has resolved on exam    Relevant Orders  CBC with Differential/Platelet 38 minutes spent reviewing all records from recent hospitalizations Return in about 2 months (around 06/22/2022).   In ED 7/18:  IMPRESSION: Severely displaced and comminuted intertrochanteric fracture of proximal right femur     Review of Systems  Constitutional:  Negative for chills, diaphoresis, fever, malaise/fatigue and weight loss.   HENT:  Negative for congestion, ear discharge, ear pain, hearing loss, nosebleeds, sore throat and tinnitus.   Eyes:  Negative for blurred vision, double vision, photophobia, discharge and redness.  Respiratory:  Negative for cough, hemoptysis, sputum production, shortness of breath, wheezing and stridor.        No excess mucus  Cardiovascular:  Negative for chest pain, palpitations, orthopnea, claudication, leg swelling and PND.  Gastrointestinal:  Negative for abdominal pain, blood in stool, constipation, diarrhea, heartburn, melena, nausea and vomiting.  Genitourinary:  Negative for dysuria, flank pain, frequency, hematuria and urgency.  Musculoskeletal:  Negative for back pain, falls, joint pain, myalgias and neck pain.  Skin:  Negative for itching and rash.  Neurological:  Negative for dizziness, tingling, tremors, sensory change, speech change, focal weakness, seizures, loss of consciousness, weakness and headaches.  Endo/Heme/Allergies:  Negative for environmental allergies and polydipsia. Does not bruise/bleed easily.  Psychiatric/Behavioral:  Negative  for depression, hallucinations, memory loss, substance abuse and suicidal ideas. The patient is not nervous/anxious and does not have insomnia.   All other systems reviewed and are negative.     Objective:     There were no vitals taken for this visit.   Physical Exam Vitals reviewed.  Constitutional:      Appearance: Normal appearance. He is well-developed. He is obese. He is not diaphoretic.     Comments: Wheelchair-bound  HENT:     Head: Normocephalic and atraumatic.     Nose: No nasal deformity, septal deviation, mucosal edema or rhinorrhea.     Right Sinus: No maxillary sinus tenderness or frontal sinus tenderness.     Left Sinus: No maxillary sinus tenderness or frontal sinus tenderness.     Mouth/Throat:     Pharynx: No oropharyngeal exudate.  Eyes:     General: No scleral icterus.    Conjunctiva/sclera:  Conjunctivae normal.     Pupils: Pupils are equal, round, and reactive to light.  Neck:     Thyroid: No thyromegaly.     Vascular: No carotid bruit or JVD.     Trachea: Trachea normal. No tracheal tenderness or tracheal deviation.  Cardiovascular:     Rate and Rhythm: Normal rate and regular rhythm.     Chest Wall: PMI is not displaced.     Pulses: Normal pulses. No decreased pulses.     Heart sounds: Normal heart sounds, S1 normal and S2 normal. Heart sounds not distant. No murmur heard.    No systolic murmur is present.     No diastolic murmur is present.     No friction rub. No gallop. No S3 or S4 sounds.  Pulmonary:     Effort: No tachypnea, accessory muscle usage or respiratory distress.     Breath sounds: No stridor. No decreased breath sounds, wheezing, rhonchi or rales.  Chest:     Chest wall: No tenderness.  Abdominal:     General: Bowel sounds are normal. There is no distension.     Palpations: Abdomen is soft. Abdomen is not rigid.     Tenderness: There is no abdominal tenderness. There is no guarding or rebound.  Musculoskeletal:        General: Normal range of motion.     Cervical back: Normal range of motion and neck supple. No edema, erythema or rigidity. No muscular tenderness. Normal range of motion.  Lymphadenopathy:     Head:     Right side of head: No submental or submandibular adenopathy.     Left side of head: No submental or submandibular adenopathy.     Cervical: No cervical adenopathy.  Skin:    General: Skin is warm and dry.     Coloration: Skin is not pale.     Findings: No rash.     Nails: There is no clubbing.     Comments: Right upper extremity has a PICC line it appears to be dressed properly and is not infected as a single port  Neurological:     Mental Status: He is alert and oriented to person, place, and time. Mental status is at baseline.     Sensory: No sensory deficit.     Motor: Weakness present.  Psychiatric:        Speech: Speech  normal.        Behavior: Behavior normal.     No results found for any visits on 05/19/22.     The ASCVD Risk score (Arnett DK,  et al., 2019) failed to calculate for the following reasons:   The valid total cholesterol range is 130 to 320 mg/dL    Assessment & Plan:   Problem List Items Addressed This Visit   None 38 minutes spent reviewing all records from recent hospitalizations No follow-ups on file.    Asencion Noble, MD

## 2022-05-19 ENCOUNTER — Other Ambulatory Visit: Payer: Self-pay

## 2022-05-19 ENCOUNTER — Encounter: Payer: Self-pay | Admitting: Critical Care Medicine

## 2022-05-19 ENCOUNTER — Ambulatory Visit
Payer: No Typology Code available for payment source | Attending: Critical Care Medicine | Admitting: Critical Care Medicine

## 2022-05-19 ENCOUNTER — Telehealth: Payer: Self-pay

## 2022-05-19 VITALS — BP 111/72 | HR 118 | Temp 98.4°F | Resp 18

## 2022-05-19 DIAGNOSIS — S72001G Fracture of unspecified part of neck of right femur, subsequent encounter for closed fracture with delayed healing: Secondary | ICD-10-CM

## 2022-05-19 DIAGNOSIS — M6008 Infective myositis, other site: Secondary | ICD-10-CM

## 2022-05-19 DIAGNOSIS — A499 Bacterial infection, unspecified: Secondary | ICD-10-CM

## 2022-05-19 DIAGNOSIS — D508 Other iron deficiency anemias: Secondary | ICD-10-CM

## 2022-05-19 DIAGNOSIS — E119 Type 2 diabetes mellitus without complications: Secondary | ICD-10-CM

## 2022-05-19 DIAGNOSIS — Z452 Encounter for adjustment and management of vascular access device: Secondary | ICD-10-CM

## 2022-05-19 DIAGNOSIS — I10 Essential (primary) hypertension: Secondary | ICD-10-CM

## 2022-05-19 DIAGNOSIS — D649 Anemia, unspecified: Secondary | ICD-10-CM

## 2022-05-19 DIAGNOSIS — Z1612 Extended spectrum beta lactamase (ESBL) resistance: Secondary | ICD-10-CM

## 2022-05-19 DIAGNOSIS — B9620 Unspecified Escherichia coli [E. coli] as the cause of diseases classified elsewhere: Secondary | ICD-10-CM

## 2022-05-19 DIAGNOSIS — Z794 Long term (current) use of insulin: Secondary | ICD-10-CM

## 2022-05-19 DIAGNOSIS — N12 Tubulo-interstitial nephritis, not specified as acute or chronic: Secondary | ICD-10-CM

## 2022-05-19 LAB — GLUCOSE, POCT (MANUAL RESULT ENTRY): POC Glucose: 133 mg/dl — AB (ref 70–99)

## 2022-05-19 MED ORDER — VITAMIN D (ERGOCALCIFEROL) 1.25 MG (50000 UNIT) PO CAPS
50000.0000 [IU] | ORAL_CAPSULE | ORAL | 5 refills | Status: DC
  Filled 2022-05-19: qty 5, 35d supply, fill #0

## 2022-05-19 NOTE — Assessment & Plan Note (Signed)
Continue iron supplement reassess CBC iron levels

## 2022-05-19 NOTE — Assessment & Plan Note (Signed)
Blood pressure well controlled no change in medications ?

## 2022-05-19 NOTE — Assessment & Plan Note (Signed)
Resolved

## 2022-05-19 NOTE — Assessment & Plan Note (Addendum)
Glycemic control is improved no change in medicines and will refill insulin

## 2022-05-19 NOTE — Assessment & Plan Note (Signed)
Nonoperative approach is planned

## 2022-05-19 NOTE — Telephone Encounter (Unsigned)
I met with the patient and his wife when they were in the clinic today. Salena Saner, EMT/Community Paramedicine Program was also present and explained her role and how she may be able to assist them. Her plan is to meet with the patient at home next week - 05/28/2022. Spanish interpreter [place] # [phone]/[employer] assisted with the call.   Patient's wife had a letter from [facility] stating that patient's Medicaid application is pending because they are waiting for a determination of patient's disability status. Per patient's wife, He doesn't have a Fish farm manager number and he has never applied for disability.  I explained to them that we are in the process of arranging for a representative from [employer] to speak with them regarding citizenship and probability of receiving Medicaid and Disability.  I spoke to [place], [place] Nurse Program and she will reach out to Scientist, research (physical sciences) Nurse at [employer] for assistance. She also recommended that the patient/his wife call [employer] and request case management assistance # [phone]. She said that [facility] prefers for the patient to initiate the call.  I also explained to the patient and his wife , that the clinic will pay for another month of catheters for him while the patient determines how they will pay for them going forward.   Karma Ganja, RN provided approval to use [employer] for assistance with purchasing another month of catheters.   I spoke to Braden/ [employer] # [phone]  and placed an order for catheters- [employer] ref # [id number].  This is the product that the patient's wife brought to the clinic and said that is what they use. This was not what was ordered last month but Braden said the order would be placed for shipment tomorrow. He took the payment information and said I will receive an email with receipt of payment  the cost for these catheters is $52.62/90 catheters.  I also  spoke to Elena/ [employer] billing and informed her that I have not received a receipt for payment made for catheters on 04/09/2022.  She said she will follow up . In addition, I left a message for Racheal Hoaglan/[employer] ext: [phone] requesting a call back regarding receipt for payment made on 04/09/2022.

## 2022-05-19 NOTE — Patient Instructions (Signed)
Refills on your iron supplement is being prepared also on your insulin as well  Begin vitamin D 1 weekly  Complete set of screening labs obtained at this visit  We are working on more Foley catheters to replace the ones you have used  Return to see Dr. [name] 2 months

## 2022-05-19 NOTE — Progress Notes (Signed)
Paramedicine Encounter    Patient ID: John Meza, male    DOB: Apr 05, 1961, 61 y.o.   MRN: [id number]   Using [place] [phone]   Met with John Meza in clinic today with Dr. Joya Gaskins where John Meza introduced me to the patient and his wife where they agreed to paramedicine services.   Dr. Joya Gaskins met with patient and preformed assessment and evaluation reviewing medications and overall management.   Currently John Meza's medicaid is pending due to disability not being determined however we learned that John Meza has not filed for disability and does not have a Arboriculturist. John Meza was noted to have medicaid back in 2016 but unsure if he did not re-certify over the years. John Meza is checking with DSS case manager who sent John Meza a letter informing him of pending medicaid status. I will continue to follow up with same to ensure he gets all needed documents and or resources.   Dr. Joya Gaskins suggested [facility] follow up with [facility] however since patient is un-insured the copay would be $350 out of pocket. Patient and wife report they are unable to afford this. He is continuing to have some swelling at the right leg from fracture. He denies pain as he is paraplegic.    I reviewed medications and informed the patient and his wife of what medications to pick up at [facility] today before going home.  -Ferrous Sulfate -Insulin -Vitamin D  Catheters are being ordered by John Meza today through patient assistance and informed the patient that we may not be able to do this much longer and the cost is $50-$60 per box. They verbalized understanding.   I plan to follow up in the home in one week on Thursday, patient and wife agreed with plan.   Clinic visit complete.   CBG- 130  Labs obtained today.  My phone number provided to patient and wife and know to call me if needed before our next visit.   John Meza, [place] 05/19/2022     Patient  Care Team: John Stain, MD as PCP - General (Pulmonary Disease)  Patient Active Problem List   Diagnosis Date Noted   Medication monitoring encounter 04/21/2022   Closed fracture of neck of right femur ([place])    Normocytic anemia 03/03/2022   Wheelchair dependence 12/23/2020   History of cholecystectomy 11/18/2020   Insulin dependent type 2 diabetes mellitus, controlled ([place])    Essential hypertension    Neurogenic bladder 01/06/2012   Neurogenic bowel 01/06/2012   Paraplegia ([place]) 11/16/2011   OBESITY, MODERATE 05/26/2007   HLD (hyperlipidemia) 05/25/2007    Current Outpatient Medications:    atorvastatin (LIPITOR) 20 MG tablet, Take 1 tablet (20 mg total) by mouth daily at 12 noon., Disp: 90 tablet, Rfl: 2   ferrous sulfate 325 (65 FE) MG tablet, Take 1 tablet (325 mg total) by mouth daily. (Patient not taking: Reported on 05/19/2022), Disp: 30 tablet, Rfl: 4   Insulin Glargine (BASAGLAR KWIKPEN) 100 UNIT/ML, Inject 25 Units into the skin at bedtime., Disp: 15 mL, Rfl: 11   Insulin Pen Needle (TECHLITE PEN NEEDLES) 32G X 4 MM MISC, Use as directed to inject insulin, Disp: 100 each, Rfl: 6   metFORMIN (GLUCOPHAGE) 1000 MG tablet, TAKE 1 TABLET (1,000 MG TOTAL) BY MOUTH 2 (TWO) TIMES DAILY WITH A MEAL. (Patient taking differently: Take 1,000 mg by mouth 2 (two) times daily with a meal.), Disp: 180 tablet, Rfl: 3   methocarbamol (ROBAXIN) 500 MG tablet,  Take 1 tablet (500 mg total) by mouth every 6 (six) hours as needed for muscle spasms., Disp: 90 tablet, Rfl: 2   omeprazole (PRILOSEC) 20 MG capsule, Take 1 capsule by mouth daily to reduce stomach acid (Patient taking differently: Take 20 mg by mouth daily as needed (acid reflux/indigestion).), Disp: 30 capsule, Rfl: 11   sitaGLIPtin (JANUVIA) 100 MG tablet, Take 1 tablet by mouth daily (Patient taking differently: Take 100 mg by mouth daily. Take 1 tablet by mouth daily), Disp: 90 tablet, Rfl: 3   valsartan-hydrochlorothiazide  (DIOVAN-HCT) 320-25 MG tablet, Take 1 tablet by mouth daily., Disp: 90 tablet, Rfl: 3   Vitamin D, Ergocalciferol, (DRISDOL) 1.25 MG (50000 UNIT) CAPS capsule, Take 1 capsule (50,000 Units total) by mouth every 7 (seven) days., Disp: 5 capsule, Rfl: 5 Allergies  Allergen Reactions   Macrobid [Nitrofurantoin] Itching and Rash     Social History   Socioeconomic History   Marital status: Married    Spouse name: Not on file   Number of children: Not on file   Years of education: Not on file   Highest education level: Not on file  Occupational History   Occupation: disabled  Tobacco Use   Smoking status: Former    Packs/day: 1.00    Years: 5.00    Total pack years: 5.00    Types: Cigarettes    Quit date: 10/13/2011    Years since quitting: 10.6   Smokeless tobacco: Never   Tobacco comments:    03-24-19 per pt he stopped 1 mo ago   Vaping Use   Vaping Use: Never used  Substance and Sexual Activity   Alcohol use: Not Currently    Alcohol/week: 1.0 standard drink of alcohol    Types: 1 Cans of beer per week   Drug use: Never   Sexual activity: Not on file  Other Topics Concern   Not on file  Social History Narrative   Not on file   Social Determinants of Health   Financial Resource Strain: Not on file  Food Insecurity: Not on file  Transportation Needs: Not on file  Physical Activity: Not on file  Stress: Not on file  Social Connections: Not on file  Intimate Partner Violence: Not on file    Physical Exam      Future Appointments  Date Time Provider [place]  06/23/2022  9:30 AM John Stain, MD [facility] None  08/03/2022 11:40 AM John Stain, MD [facility] None     ACTION: Home visit completed

## 2022-05-19 NOTE — Assessment & Plan Note (Signed)
Removed

## 2022-05-20 ENCOUNTER — Other Ambulatory Visit: Payer: Self-pay

## 2022-05-20 ENCOUNTER — Ambulatory Visit: Payer: Self-pay | Admitting: *Deleted

## 2022-05-20 ENCOUNTER — Telehealth: Payer: Self-pay

## 2022-05-20 LAB — IRON,TIBC AND FERRITIN PANEL
Ferritin: 288 ng/mL (ref 30–400)
Iron Saturation: 10 % — ABNORMAL LOW (ref 15–55)
Iron: 19 ug/dL — ABNORMAL LOW (ref 38–169)
Total Iron Binding Capacity: 195 ug/dL — ABNORMAL LOW (ref 250–450)
UIBC: 176 ug/dL (ref 111–343)

## 2022-05-20 LAB — COMPREHENSIVE METABOLIC PANEL
ALT: 19 IU/L (ref 0–44)
AST: 16 IU/L (ref 0–40)
Albumin/Globulin Ratio: 0.9 — ABNORMAL LOW (ref 1.2–2.2)
Albumin: 3.3 g/dL — ABNORMAL LOW (ref 3.8–4.9)
Alkaline Phosphatase: 156 IU/L — ABNORMAL HIGH (ref 44–121)
BUN/Creatinine Ratio: 21 — ABNORMAL HIGH (ref 9–20)
BUN: 20 mg/dL (ref 6–24)
Bilirubin Total: 0.3 mg/dL (ref 0.0–1.2)
CO2: 15 mmol/L — ABNORMAL LOW (ref 20–29)
Calcium: 9.1 mg/dL (ref 8.7–10.2)
Chloride: 91 mmol/L — ABNORMAL LOW (ref 96–106)
Creatinine, Ser: 0.95 mg/dL (ref 0.76–1.27)
Globulin, Total: 3.5 g/dL (ref 1.5–4.5)
Glucose: 124 mg/dL — ABNORMAL HIGH (ref 70–99)
Potassium: 5.6 mmol/L — ABNORMAL HIGH (ref 3.5–5.2)
Sodium: 124 mmol/L — ABNORMAL LOW (ref 134–144)
Total Protein: 6.8 g/dL (ref 6.0–8.5)
eGFR: 92 mL/min/{1.73_m2} (ref >59)

## 2022-05-20 LAB — CBC WITH DIFFERENTIAL/PLATELET
Basophils Absolute: 0.1 10*3/uL (ref 0.0–0.2)
Basos: 1 %
EOS (ABSOLUTE): 0.2 10*3/uL (ref 0.0–0.4)
Eos: 2 %
Hematocrit: 25.5 % — ABNORMAL LOW (ref 37.5–51.0)
Hemoglobin: 8 g/dL — ABNORMAL LOW (ref 13.0–17.7)
Immature Grans (Abs): 0.1 10*3/uL (ref 0.0–0.1)
Immature Granulocytes: 1 %
Lymphocytes Absolute: 1.8 10*3/uL (ref 0.7–3.1)
Lymphs: 18 %
MCH: 25.1 pg — ABNORMAL LOW (ref 26.6–33.0)
MCHC: 31.4 g/dL — ABNORMAL LOW (ref 31.5–35.7)
MCV: 80 fL (ref 79–97)
Monocytes Absolute: 0.7 10*3/uL (ref 0.1–0.9)
Monocytes: 8 %
Neutrophils Absolute: 6.9 10*3/uL (ref 1.4–7.0)
Neutrophils: 70 %
Platelets: 529 10*3/uL — ABNORMAL HIGH (ref 150–450)
RBC: 3.19 x10E6/uL — ABNORMAL LOW (ref 4.14–5.80)
RDW: 16 % — ABNORMAL HIGH (ref 11.6–15.4)
WBC: 9.8 10*3/uL (ref 3.4–10.8)

## 2022-05-20 LAB — VITAMIN D 25 HYDROXY (VIT D DEFICIENCY, FRACTURES): Vit D, 25-Hydroxy: 10.2 ng/mL — ABNORMAL LOW (ref 30.0–100.0)

## 2022-05-20 NOTE — Telephone Encounter (Signed)
Pt was called and his wife answered (on DPR) aware of results, DOB was confirmed.

## 2022-05-20 NOTE — Telephone Encounter (Signed)
noted 

## 2022-05-20 NOTE — Progress Notes (Signed)
Let pt know vit d very low sent vit D to pharmacy,  also take two Tums calcium chews daily, kidney liver normalblood counts improved, iron remains low stay on iron pill

## 2022-05-20 NOTE — Telephone Encounter (Signed)
-----   Message from Storm Frisk, MD sent at 05/20/2022  8:36 AM EDT ----- Let pt know vit d very low sent vit D to pharmacy,  also take two Tums calcium chews daily, kidney liver normalblood counts improved, iron remains low stay on iron pill

## 2022-05-20 NOTE — Telephone Encounter (Signed)
Interpreter ID #[id number] John Meza  Chief Complaint: diarrhea Symptoms: watery stools. Not eating or drinking. Weakness, bladder emptied by catheter small amounts.  Frequency: since Sunday 05/17/22 forgot to mention at OV yesterday  Pertinent Negatives: Patient denies dizziness Disposition: [x] ED /[] Urgent Care (no appt availability in office) / [] Appointment(In office/virtual)/ []  [facility]/ [] Home Care/ [] Refused Recommended Disposition /[] [facility]/ []  Follow-up with PCP Additional Notes:   Recommended  ED. Patient wife wants to try gatorade and pedialyte for hydration. Please advise.     Reason for Disposition  Patient sounds very sick or weak to the triager  Answer Assessment - Initial Assessment Questions 1. DIARRHEA SEVERITY: "How bad is the diarrhea?" "How many more stools have you had in the past 24 hours than normal?"    - NO DIARRHEA (SCALE 0)   - MILD (SCALE 1-3): Few loose or mushy BMs; increase of 1-3 stools over normal daily number of stools; mild increase in ostomy output.   -  MODERATE (SCALE 4-7): Increase of 4-6 stools daily over normal; moderate increase in ostomy output.   -  SEVERE (SCALE 8-10; OR "WORST POSSIBLE"): Increase of 7 or more stools daily over normal; moderate increase in ostomy output; incontinence.     Moderate 4-6 times since yesterday watery stool 2. ONSET: "When did the diarrhea begin?"      Sunday 05/17/22 3. BM CONSISTENCY: "How loose or watery is the diarrhea?"      Watery  4. VOMITING: "Are you also vomiting?" If Yes, ask: "How many times in the past 24 hours?"      No  5. ABDOMEN PAIN: "Are you having any abdomen pain?" If Yes, ask: "What does it feel like?" (e.g., crampy, dull, intermittent, constant)      Feels "empty" no appetite  6. ABDOMEN PAIN SEVERITY: If present, ask: "How bad is the pain?"  (e.g., Scale 1-10; mild, moderate, or severe)   - MILD (1-3): doesn't interfere with normal activities, abdomen soft and not  tender to touch    - MODERATE (4-7): interferes with normal activities or awakens from sleep, abdomen tender to touch    - SEVERE (8-10): excruciating pain, doubled over, unable to do any normal activities       No pain to touch only "hunger" pain  7. ORAL INTAKE: If vomiting, "Have you been able to drink liquids?" "How much liquids have you had in the past 24 hours?"     no 8. HYDRATION: "Any signs of dehydration?" (e.g., dry mouth [not just dry lips], too weak to stand, dizziness, new weight loss) "When did you last urinate?"     Weakness unable to stand "does not move legs" in w/c . Requires catheter to empty bladder. Small amount of urine noted.  9. EXPOSURE: "Have you traveled to a foreign country recently?" "Have you been exposed to anyone with diarrhea?" "Could you have eaten any food that was spoiled?"     na 10. ANTIBIOTIC USE: "Are you taking antibiotics now or have you taken antibiotics in the past 2 months?"       Yes  11. OTHER SYMPTOMS: "Do you have any other symptoms?" (e.g., fever, blood in stool)       Decrease urine output from catheterization. Weakness  12. PREGNANCY: "Is there any chance you are pregnant?" "When was your last menstrual period?"       na  Protocols used: [facility]

## 2022-05-20 NOTE — Telephone Encounter (Signed)
Pt did not complain of this yesterday.  Agree with ED given fragile nature of pt
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
## 2022-05-22 ENCOUNTER — Other Ambulatory Visit: Payer: Self-pay

## 2022-05-23 ENCOUNTER — Encounter (HOSPITAL_COMMUNITY): Payer: Self-pay | Admitting: *Deleted

## 2022-05-23 ENCOUNTER — Other Ambulatory Visit: Payer: Self-pay

## 2022-05-23 ENCOUNTER — Emergency Department (HOSPITAL_COMMUNITY)
Admission: EM | Admit: 2022-05-23 | Discharge: 2022-05-24 | Disposition: A | Discharge status: Home / Self Care | Payer: No Typology Code available for payment source | Attending: Emergency Medicine | Admitting: Emergency Medicine

## 2022-05-23 ENCOUNTER — Emergency Department (HOSPITAL_COMMUNITY): Payer: No Typology Code available for payment source

## 2022-05-23 DIAGNOSIS — Z794 Long term (current) use of insulin: Secondary | ICD-10-CM | POA: Insufficient documentation

## 2022-05-23 DIAGNOSIS — Z79899 Other long term (current) drug therapy: Secondary | ICD-10-CM | POA: Insufficient documentation

## 2022-05-23 DIAGNOSIS — M7989 Other specified soft tissue disorders: Secondary | ICD-10-CM | POA: Insufficient documentation

## 2022-05-23 DIAGNOSIS — M25469 Effusion, unspecified knee: Secondary | ICD-10-CM

## 2022-05-23 NOTE — ED Triage Notes (Signed)
Pt was transferring from wheelchair to bed, and felt like he has swelling in his right knee.  Denies injury. Denies pain (Paraplegia, w/c bound)

## 2022-05-24 NOTE — ED Notes (Signed)
Patient right knee wrapped in ace wrap

## 2022-05-24 NOTE — ED Provider Notes (Signed)
[facility] EMERGENCY DEPARTMENT Provider Note   CSN: [id number] Arrival date & time: 05/23/22  1958     History  Chief Complaint  Patient presents with   Knee Pain    John Meza is a 61 y.o. male.  Patient here with atraumatic right knee swelling. Recently dx with a femur fracture but non-op secondary to his paraplegia and in wheelchair at baseline. No new injuries just noticed that he had a swollen knee so came here. No fevers. No recent illnesses. No redness.    Knee Pain      Home Medications Prior to Admission medications   Medication Sig Start Date End Date Taking? Authorizing Provider  atorvastatin (LIPITOR) 20 MG tablet Take 1 tablet (20 mg total) by mouth daily at 12 noon. 03/18/22 03/18/23  Storm Frisk, MD  ferrous sulfate 325 (65 FE) MG tablet Take 1 tablet (325 mg total) by mouth daily. 03/18/22   Storm Frisk, MD  Insulin Glargine [facility] KWIKPEN) 100 UNIT/ML Inject 25 Units into the skin at bedtime. 02/02/22 02/02/23  Storm Frisk, MD  Insulin Pen Needle (TECHLITE PEN NEEDLES) 32G X 4 MM MISC Use as directed to inject insulin 03/18/22   Storm Frisk, MD  metFORMIN (GLUCOPHAGE) 1000 MG tablet TAKE 1 TABLET (1,000 MG TOTAL) BY MOUTH 2 (TWO) TIMES DAILY WITH A MEAL. Patient taking differently: Take 1,000 mg by mouth 2 (two) times daily with a meal. 03/18/22 03/18/23  Storm Frisk, MD  methocarbamol (ROBAXIN) 500 MG tablet Take 1 tablet (500 mg total) by mouth every 6 (six) hours as needed for muscle spasms. 11/03/21   Storm Frisk, MD  omeprazole (PRILOSEC) 20 MG capsule Take 1 capsule by mouth daily to reduce stomach acid Patient taking differently: Take 20 mg by mouth daily as needed (acid reflux/indigestion). 11/03/21 11/03/22  Storm Frisk, MD  sitaGLIPtin (JANUVIA) 100 MG tablet Take 1 tablet by mouth daily Patient taking differently: Take 100 mg by mouth daily. Take 1 tablet by mouth daily 03/18/22   Storm Frisk, MD  valsartan-hydrochlorothiazide (DIOVAN-HCT) 320-25 MG tablet Take 1 tablet by mouth daily. 03/18/22   Storm Frisk, MD  Vitamin D, Ergocalciferol, (DRISDOL) 1.25 MG (50000 UNIT) CAPS capsule Take 1 capsule (50,000 Units total) by mouth every 7 (seven) days. 05/19/22   Storm Frisk, MD  loratadine (CLARITIN) 10 MG tablet Take 1 tablet (10 mg total) by mouth daily. As needed for itchy throat/allergy symptoms Patient not taking: Reported on 03/24/2019 12/26/18 07/05/20  Cain Saupe, MD      Allergies    Macrobid [nitrofurantoin]    Review of Systems   Review of Systems  Physical Exam Updated Vital Signs BP (!) 140/82   Pulse 78   Temp 98.6 F (37 C)   Resp 18   SpO2 98%  Physical Exam Vitals and nursing note reviewed.  Constitutional:      Appearance: He is well-developed.  HENT:     Head: Normocephalic and atraumatic.     Mouth/Throat:     Mouth: Mucous membranes are moist.     Pharynx: Oropharynx is clear.  Cardiovascular:     Rate and Rhythm: Normal rate.  Pulmonary:     Effort: Pulmonary effort is normal. No respiratory distress.  Abdominal:     General: Abdomen is flat. There is no distension.  Musculoskeletal:        General: Swelling (right knee) present. No tenderness or deformity. Normal  range of motion.     Cervical back: Normal range of motion.  Neurological:     Mental Status: He is alert. Mental status is at baseline.     ED Results / Procedures / Treatments   Labs (all labs ordered are listed, but only abnormal results are displayed) Labs Reviewed - No data to display  EKG None  Radiology DG Knee Complete 4 Views Right  Result Date: 05/23/2022 CLINICAL DATA:  Right knee pain, known history of proximal femoral fracture EXAM: RIGHT KNEE - COMPLETE 4+ VIEW COMPARISON:  04/29/2022 FINDINGS: No acute fracture or dislocation is noted. No joint effusion is seen. No soft tissue abnormality is noted. IMPRESSION: No acute abnormality noted.  Electronically Signed   By: Alcide Clever M.D.   On: 05/23/2022 21:53    Procedures Procedures    Medications Ordered in ED Medications - No data to display  ED Course/ Medical Decision Making/ A&P                           Medical Decision Making Amount and/or Complexity of Data Reviewed Radiology: ordered.   No e/o septic arthritis. Difficult to tell etiology, secondary to neurologic status. Will advise RICE therapy as able.    Final Clinical Impression(s) / ED Diagnoses Final diagnoses:  Knee swelling    Rx / DC Orders ED Discharge Orders     None         Aliannah Holstrom, Barbara Cower, MD 05/24/22 [phone]

## 2022-05-27 ENCOUNTER — Telehealth (HOSPITAL_COMMUNITY): Payer: Self-pay

## 2022-05-27 NOTE — Telephone Encounter (Signed)
Spoke to [place]- Prentice's daughter and confirmed appointment for home visit tomorrow at 1030. Call complete.   Maralyn Sago, EMT-Paramedic [phone] 05/27/2022

## 2022-05-28 ENCOUNTER — Other Ambulatory Visit: Payer: Self-pay

## 2022-05-28 ENCOUNTER — Telehealth: Payer: Self-pay | Admitting: Critical Care Medicine

## 2022-05-28 MED ORDER — ATORVASTATIN CALCIUM 20 MG PO TABS
20.0000 mg | ORAL_TABLET | Freq: Every day | ORAL | 2 refills | Status: DC
  Filled 2022-05-28: qty 90, 90d supply, fill #0
  Filled 2022-06-04: qty 30, 30d supply, fill #0
  Filled 2022-07-08: qty 30, 30d supply, fill #1
  Filled 2022-08-05: qty 30, 30d supply, fill #2
  Filled 2022-09-07: qty 30, 30d supply, fill #3
  Filled 2022-10-02: qty 30, 30d supply, fill #4
  Filled 2022-11-03: qty 30, 30d supply, fill #5

## 2022-05-28 MED ORDER — VALSARTAN-HYDROCHLOROTHIAZIDE 320-25 MG PO TABS
1.0000 | ORAL_TABLET | Freq: Every day | ORAL | 3 refills | Status: DC
  Filled 2022-05-28 – 2022-06-04 (×2): qty 30, 30d supply, fill #0
  Filled 2022-07-08: qty 30, 30d supply, fill #1
  Filled 2022-08-05: qty 30, 30d supply, fill #2
  Filled 2022-09-07: qty 30, 30d supply, fill #3
  Filled 2022-10-02: qty 30, 30d supply, fill #4
  Filled 2022-11-03: qty 30, 30d supply, fill #5

## 2022-05-28 NOTE — Telephone Encounter (Signed)
This is an incredibly helpful information thank you so much John Meza you are the best  I will send in refill prescriptions for the atorvastatin and the valsartan  Note he should not take any vitamin D 50,000 unit dose for about 3 weeks since he was taking it daily he is maximally loaded with vitamin D then resume once weekly

## 2022-05-28 NOTE — Telephone Encounter (Signed)
Noted. I sent a message to Mardee Postin, Pharm Tech requesting she note in patient's record that all medication labels need to be in Spanish, including Vitamin D.
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
## 2022-05-29 ENCOUNTER — Other Ambulatory Visit: Payer: Self-pay

## 2022-06-03 ENCOUNTER — Other Ambulatory Visit: Payer: Self-pay

## 2022-06-04 ENCOUNTER — Ambulatory Visit: Payer: No Typology Code available for payment source | Admitting: Critical Care Medicine

## 2022-06-04 ENCOUNTER — Other Ambulatory Visit: Payer: Self-pay

## 2022-06-04 NOTE — Progress Notes (Signed)
Paramedicine Encounter    Patient ID: John Meza, male    DOB: 01-18-1961, 61 y.o.   MRN: [id number]  Arrived for home visit where his daughter was able to help translate for [facility] who reports feeling good today. He is laying on his left side in the bed alert and oriented denying any pain. He denied dizziness, chest pain, shortness of breath, headache, diarrhea or GI upset.   He has been compliant with his medications and his wife filled his pill box this morning and I checked for accuracy. It was correct. I picked up refills for him today at [facility] and placed in pill bag for him. I reviewed all meds and they were confirmed. No refills needed at this time.   I obtained vitals and they are as noted:  BP- 102/56 HR- 88 RR-16 O2- 98% CBG- 95   I talked with his daughter and wife and they report he is using self caths with no issues, urine is normal color and odor and report no lower back pain or signs of infection.   Hematoma to foot is healing up and he denied any recent injury.   I reviewed upcoming appointments with family and they verbalized understanding.   I discussed if they had reached out to [facility] and they report they have and plan to meet with a lawyer to assist with obtaining SS # to apply for Disability and Medicaid on Sept 23rd. I will continue to follow up.  Home visit complete, I will come out for home visit in two weeks. Pt and family agreeable with same and know to reach out in the meantime if needed.   Maralyn Sago, EMT-Paramedic [phone] 06/04/2022   Patient Care Team: Storm Frisk, MD as PCP - General (Pulmonary Disease)  Patient Active Problem List   Diagnosis Date Noted   Medication monitoring encounter 04/21/2022   Closed fracture of neck of right femur (HCC)    Normocytic anemia 03/03/2022   Wheelchair dependence 12/23/2020   History of cholecystectomy 11/18/2020   Insulin dependent type 2 diabetes mellitus, controlled (HCC)     Essential hypertension    Neurogenic bladder 01/06/2012   Neurogenic bowel 01/06/2012   Paraplegia (HCC) 11/16/2011   OBESITY, MODERATE 05/26/2007   HLD (hyperlipidemia) 05/25/2007    Current Outpatient Medications:    atorvastatin (LIPITOR) 20 MG tablet, Take 1 tablet (20 mg total) by mouth daily at 12 noon., Disp: 90 tablet, Rfl: 2   ferrous sulfate 325 (65 FE) MG tablet, Take 1 tablet (325 mg total) by mouth daily., Disp: 30 tablet, Rfl: 4   Insulin Glargine (BASAGLAR KWIKPEN) 100 UNIT/ML, Inject 25 Units into the skin at bedtime., Disp: 15 mL, Rfl: 11   Insulin Pen Needle (TECHLITE PEN NEEDLES) 32G X 4 MM MISC, Use as directed to inject insulin, Disp: 100 each, Rfl: 6   metFORMIN (GLUCOPHAGE) 1000 MG tablet, TAKE 1 TABLET (1,000 MG TOTAL) BY MOUTH 2 (TWO) TIMES DAILY WITH A MEAL. (Patient taking differently: Take 1,000 mg by mouth 2 (two) times daily with a meal.), Disp: 180 tablet, Rfl: 3   methocarbamol (ROBAXIN) 500 MG tablet, Take 1 tablet (500 mg total) by mouth every 6 (six) hours as needed for muscle spasms., Disp: 90 tablet, Rfl: 2   omeprazole (PRILOSEC) 20 MG capsule, Take 1 capsule by mouth daily to reduce stomach acid (Patient taking differently: Take 20 mg by mouth daily as needed (acid reflux/indigestion).), Disp: 30 capsule, Rfl: 11   sitaGLIPtin (JANUVIA)  100 MG tablet, Take 1 tablet by mouth daily (Patient taking differently: Take 100 mg by mouth daily. Take 1 tablet by mouth daily), Disp: 90 tablet, Rfl: 3   valsartan-hydrochlorothiazide (DIOVAN-HCT) 320-25 MG tablet, Take 1 tablet by mouth daily., Disp: 90 tablet, Rfl: 3   Vitamin D, Ergocalciferol, (DRISDOL) 1.25 MG (50000 UNIT) CAPS capsule, Take 1 capsule (50,000 Units total) by mouth every 7 (seven) days., Disp: 5 capsule, Rfl: 5 Allergies  Allergen Reactions   Macrobid [Nitrofurantoin] Itching and Rash     Social History   Socioeconomic History   Marital status: Married    Spouse name: Not on file   Number  of children: Not on file   Years of education: Not on file   Highest education level: Not on file  Occupational History   Occupation: disabled  Tobacco Use   Smoking status: Former    Packs/day: 1.00    Years: 5.00    Total pack years: 5.00    Types: Cigarettes    Quit date: 10/13/2011    Years since quitting: 10.6   Smokeless tobacco: Never   Tobacco comments:    03-24-19 per pt he stopped 1 mo ago   Vaping Use   Vaping Use: Never used  Substance and Sexual Activity   Alcohol use: Not Currently    Alcohol/week: 1.0 standard drink of alcohol    Types: 1 Cans of beer per week   Drug use: Never   Sexual activity: Not on file  Other Topics Concern   Not on file  Social History Narrative   Not on file   Social Determinants of Health   Financial Resource Strain: Not on file  Food Insecurity: Not on file  Transportation Needs: Not on file  Physical Activity: Not on file  Stress: Not on file  Social Connections: Not on file  Intimate Partner Violence: Not on file    Physical Exam      Future Appointments  Date Time Provider Department Center  06/23/2022  9:30 AM Storm Frisk, MD [facility] None  08/03/2022 10:50 AM Claiborne Rigg, NP [facility] None     ACTION: Home visit completed

## 2022-06-18 ENCOUNTER — Telehealth: Payer: Self-pay

## 2022-06-18 ENCOUNTER — Other Ambulatory Visit: Payer: Self-pay

## 2022-06-18 ENCOUNTER — Other Ambulatory Visit (HOSPITAL_COMMUNITY): Payer: Self-pay

## 2022-06-18 NOTE — Telephone Encounter (Signed)
I spoke to Maralyn Sago, EMT after her visit with the patient today. The family has been in contact with Terrilee Croak, SW/[employer] regarding his citizenship.   The patient received documents for adisability application that need his signature. He is  not a citizen and therefore has no Orthoptist and has been advised not to sign and submit the disability application.  He/ his wife and daughter have also been encouraged to come to [employer] and meet with one of their SWs to discuss his citizenship status.  They plan to go to [employer] on Tues 06/23/2022 after his appointment at [facility] that morning.  They have also been encouraged to attend an event at [facility] in [place] on 07/04/2022 to meet with an immigration attorney for additional advice regarding citizenship.  He  needs to be address his citizenship  before he can apply for disability.

## 2022-06-18 NOTE — Progress Notes (Signed)
Paramedicine Encounter    Patient ID: John Meza, male    DOB: 08-02-1961, 61 y.o.   MRN: [id number]   Arrived for home visit for [name] who was laying supine in his hospital bed in his bedroom, awake and alert oriented X4.  His daughter in the room assisting with translation. He reports feeling okay overall but having some issues with diarrhea and gas with frequent abdominal pain and acid reflux. He reports he has not been taking his Omeprazole and I encouraged him to take this daily to help with the stomach acid. He agreed with this and plans to take it daily 30 mins before his morning meals and medications. I reviewed all meds and confirmed same. His wife is filling pill box 100% correctly- She continues to fill this weekly.   Refills needed to be called into [facility]- Ferrous Sulfate  (I called in today)   Vitals obtained as noted: BP- 118/68 HR- 85 RR- 16 O2- 98% CBG- 128  He is using cathers 3-4 times daily- they report he has about 30 left. They understand they will need to pay for the next shipment of these and agree with plan.   SDOH-  [place] received paperwork in the mail for disability application. However he is not a legal citizen there for can not receive benefits until he becomes a legal immigrant. Today we contacted [employer] and spoke to a Child psychotherapist who advised him to not turn in any disability applications until he meets with an immigration attorney to establish citizenship. [employer] SW Terrilee Croak offered the walk in hours of Tuesdays and Thursdays 2-6 and Wednesdays 9-1. She also reports there is an event happening in [place] at [facility] on Sept 23 where him and his wife could talk to immigration attorney free of charge. I wrote all this down him and his daughter reports they will go to [employer] on Tuesday. I will continue to assist with this best I can. I advised Erskine Squibb at [facility] the same information.   I will try to meet [facility] in clinic  on Tuesday morning at his visit with Dr. [name]. Patient and family expressed no other needs at this time. Home visit complete.    Maralyn Sago, EMT-Paramedic [phone] 06/18/2022   Patient Care Team: Storm Frisk, MD as PCP - General (Pulmonary Disease)  Patient Active Problem List   Diagnosis Date Noted   Medication monitoring encounter 04/21/2022   Closed fracture of neck of right femur (HCC)    Normocytic anemia 03/03/2022   Wheelchair dependence 12/23/2020   History of cholecystectomy 11/18/2020   Insulin dependent type 2 diabetes mellitus, controlled (HCC)    Essential hypertension    Neurogenic bladder 01/06/2012   Neurogenic bowel 01/06/2012   Paraplegia (HCC) 11/16/2011   OBESITY, MODERATE 05/26/2007   HLD (hyperlipidemia) 05/25/2007    Current Outpatient Medications:    atorvastatin (LIPITOR) 20 MG tablet, Take 1 tablet (20 mg total) by mouth daily at 12 noon., Disp: 90 tablet, Rfl: 2   ferrous sulfate 325 (65 FE) MG tablet, Take 1 tablet (325 mg total) by mouth daily., Disp: 30 tablet, Rfl: 4   Insulin Glargine (BASAGLAR KWIKPEN) 100 UNIT/ML, Inject 25 Units into the skin at bedtime., Disp: 15 mL, Rfl: 11   Insulin Pen Needle (TECHLITE PEN NEEDLES) 32G X 4 MM MISC, Use as directed to inject insulin, Disp: 100 each, Rfl: 6   metFORMIN (GLUCOPHAGE) 1000 MG tablet, TAKE 1 TABLET (1,000 MG  TOTAL) BY MOUTH 2 (TWO) TIMES DAILY WITH A MEAL. (Patient taking differently: Take 1,000 mg by mouth 2 (two) times daily with a meal.), Disp: 180 tablet, Rfl: 3   methocarbamol (ROBAXIN) 500 MG tablet, Take 1 tablet (500 mg total) by mouth every 6 (six) hours as needed for muscle spasms., Disp: 90 tablet, Rfl: 2   omeprazole (PRILOSEC) 20 MG capsule, Take 1 capsule by mouth daily to reduce stomach acid (Patient taking differently: Take 20 mg by mouth daily as needed (acid reflux/indigestion).), Disp: 30 capsule, Rfl: 11   sitaGLIPtin (JANUVIA) 100 MG tablet, Take 1 tablet by mouth  daily (Patient taking differently: Take 100 mg by mouth daily. Take 1 tablet by mouth daily), Disp: 90 tablet, Rfl: 3   valsartan-hydrochlorothiazide (DIOVAN-HCT) 320-25 MG tablet, Take 1 tablet by mouth daily., Disp: 90 tablet, Rfl: 3   Vitamin D, Ergocalciferol, (DRISDOL) 1.25 MG (50000 UNIT) CAPS capsule, Take 1 capsule (50,000 Units total) by mouth every 7 (seven) days. (Patient not taking: Reported on 06/04/2022), Disp: 5 capsule, Rfl: 5 Allergies  Allergen Reactions   Macrobid [Nitrofurantoin] Itching and Rash     Social History   Socioeconomic History   Marital status: Married    Spouse name: Not on file   Number of children: Not on file   Years of education: Not on file   Highest education level: Not on file  Occupational History   Occupation: disabled  Tobacco Use   Smoking status: Former    Packs/day: 1.00    Years: 5.00    Total pack years: 5.00    Types: Cigarettes    Quit date: 10/13/2011    Years since quitting: 10.6   Smokeless tobacco: Never   Tobacco comments:    03-24-19 per pt he stopped 1 mo ago   Vaping Use   Vaping Use: Never used  Substance and Sexual Activity   Alcohol use: Not Currently    Alcohol/week: 1.0 standard drink of alcohol    Types: 1 Cans of beer per week   Drug use: Never   Sexual activity: Not on file  Other Topics Concern   Not on file  Social History Narrative   Not on file   Social Determinants of Health   Financial Resource Strain: Not on file  Food Insecurity: Not on file  Transportation Needs: Not on file  Physical Activity: Not on file  Stress: Not on file  Social Connections: Not on file  Intimate Partner Violence: Not on file    Physical Exam      Future Appointments  Date Time Provider Department Center  06/23/2022  9:30 AM Storm Frisk, MD [facility] None  08/03/2022 10:50 AM Claiborne Rigg, NP [facility] None     ACTION: Home visit completed

## 2022-06-19 ENCOUNTER — Other Ambulatory Visit: Payer: Self-pay

## 2022-06-19 NOTE — Telephone Encounter (Signed)
I was not aware this makes sense.  Thank you Erskine Squibb

## 2022-06-20 NOTE — Congregational Nurse Program (Signed)
  Dept: [phone]   Congregational Nurse Program Note  Date of Encounter: 06/19/2022  Past Medical History: Past Medical History:  Diagnosis Date   Cellulitis and abscess of buttock 10/2016   Diabetes mellitus    ESBL (extended spectrum beta-lactamase) producing bacteria infection 03/16/2022   Hyperkalemia 03/16/2022   Hypertension    Myositis 04/21/2022   Paraplegia (HCC) 2013   fell from ladder   Pressure ulcer, buttock, right, unstageable (HCC) 12/30/2020   Pyelonephritis due to Escherichia coli 03/16/2022   Sacral decubitus ulcer, stage IV (HCC) 11/03/2021   SCI (spinal cord injury)    Sepsis due to Escherichia coli (E. coli) (HCC) 03/01/2022   Spine fracture 11/16/2011   T 11- T9-L1    Encounter Details:  CNP Questionnaire - 06/20/22 1827       Questionnaire   Do you give verbal consent to treat you today? Yes    Location Patient Presenter, broadcasting    Visit Setting Phone/Text/Email    Patient Status Unknown    Insurance Uninsured ([employer]/Self-Pay)    Insurance Referral N/A    Medication Have Medication Insecurities    Medical Provider Yes    Screening Referrals N/A    Medical Referral N/A    Medical Appointment Made N/A    Food N/A    Transportation N/A    Housing/Utilities N/A    Interpersonal Safety N/A    Intervention Case Management;Navigate Healthcare System    ED Visit Averted N/A    Life-Saving Intervention Made N/A             Patient has turned in [employer] and [employer] card application through [employer]. Client instructed to let us know if they hear back from either.

## 2022-06-22 ENCOUNTER — Telehealth (HOSPITAL_COMMUNITY): Payer: Self-pay

## 2022-06-22 NOTE — Telephone Encounter (Signed)
Called John Meza daughter to inform her to let her father know I would not be able to meet him at his appointment tomorrow however I would be updating Dr. [name] of his most current home visit as noted below. She agreed and he is aware.   -[facility] financial and orange card applications submitted per congregational nurse.     SEPT 7TH 2023- Arrived for home visit for John Meza who was laying supine in his hospital bed in his bedroom, awake and alert oriented X4.  His daughter in the room assisting with translation. He reports feeling okay overall but having some issues with diarrhea and gas with frequent abdominal pain and acid reflux. He reports he has not been taking his Omeprazole and I encouraged him to take this daily to help with the stomach acid. He agreed with this and plans to take it daily 30 mins before his morning meals and medications. I reviewed all meds and confirmed same. His wife is filling pill box 100% correctly- She continues to fill this weekly.    Refills needed to be called into [facility]- Ferrous Sulfate  (I called in today)    Vitals obtained as noted: BP- 118/68 HR- 85 RR- 16 O2- 98% CBG- 128   He is using cathers 3-4 times daily- they report he has about 30 left. They understand they will need to pay for the next shipment of these and agree with plan.    SDOH-  John Meza received paperwork in the mail for disability application. However he is not a legal citizen there for can not receive benefits until he becomes a legal immigrant. Today we contacted [employer] and spoke to a Child psychotherapist who advised him to not turn in any disability applications until he meets with an immigration attorney to establish citizenship. [employer] SW Terrilee Croak offered the walk in hours of Tuesdays and Thursdays 2-6 and Wednesdays 9-1. She also reports there is an event happening in [place] at [facility] on Sept 23 where him and his wife could talk to immigration attorney free  of charge. I wrote all this down him and his daughter reports they will go to [employer] on Tuesday. I will continue to assist with this best I can. I advised Erskine Squibb at [facility] the same information.    I will try to meet John Meza in clinic on Tuesday morning at his visit with Dr. [name]. Patient and family expressed no other needs at this time. Home visit complete.      John Meza, EMT-Paramedic [phone] 06/18/2022

## 2022-06-23 ENCOUNTER — Ambulatory Visit
Payer: No Typology Code available for payment source | Attending: Critical Care Medicine | Admitting: Critical Care Medicine

## 2022-06-23 ENCOUNTER — Other Ambulatory Visit: Payer: Self-pay

## 2022-06-23 ENCOUNTER — Encounter: Payer: Self-pay | Admitting: Critical Care Medicine

## 2022-06-23 VITALS — BP 129/77 | HR 95 | Temp 98.3°F

## 2022-06-23 DIAGNOSIS — Z23 Encounter for immunization: Secondary | ICD-10-CM

## 2022-06-23 DIAGNOSIS — E119 Type 2 diabetes mellitus without complications: Secondary | ICD-10-CM

## 2022-06-23 DIAGNOSIS — R197 Diarrhea, unspecified: Secondary | ICD-10-CM | POA: Insufficient documentation

## 2022-06-23 DIAGNOSIS — Z794 Long term (current) use of insulin: Secondary | ICD-10-CM

## 2022-06-23 LAB — GLUCOSE, POCT (MANUAL RESULT ENTRY): POC Glucose: 77 mg/dl (ref 70–99)

## 2022-06-23 MED ORDER — METRONIDAZOLE 500 MG PO TABS
500.0000 mg | ORAL_TABLET | Freq: Three times a day (TID) | ORAL | 0 refills | Status: AC
  Filled 2022-06-23: qty 21, 7d supply, fill #0

## 2022-06-23 NOTE — Assessment & Plan Note (Signed)
Cute onset of diarrhea he has seen a lot of antibiotics recently we will obtain stool for pathogens  We will prescribe empiric Flagyl for 7 days 500 mg 3 times daily

## 2022-06-23 NOTE — Progress Notes (Signed)
Established Patient Office Visit  Subjective   Patient ID: John Meza, male    DOB: Dec 27, 1960  Age: 61 y.o. 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  He has had sacral decubiti which have now resolved.  He still has his original wheelchair and is not in good working order and needs a new wheelchair.  We attempted to order this 6 months ago but there was no processing of the order as he is uninsured.   Note the patient did undergo an eye exam on 14 January at the [facility] and it was negative for diabetic retinopathy.  His A1c is 8.0.  His blood sugars at home of been in the 113 130 range.  Blood sugar today is 83.  He does need lab screenings today including liver function renal function.   The patient has no other real complaints except he has occasional back spasms when he lays flat in the bed.  On arrival blood pressure is good 130/80.   4/24 Patient returns in follow-up and now has a new wheelchair with adequate padding.  Sacral decubiti this was provided with the patient assistance line.  This visit was assisted by [place] video interpreter [name] [phone].  On arrival blood sugar is 97 and he brings his glucometer readings which range from 160-98 with an average of about 130.  Unfortunately despite this his A1c is 8.3 so he clearly has glycemic episodes at times.   He has been maintaining Sitagliptin and metformin along with insulin glargine.  Patient does need refills on medications.  Blood pressure is excellent on arrival 138/74.   Patient does complain of some blood in his urine when he self catheterizes himself he does have follow-up appointment with urology upcoming.   Patient has no other real complaints.  6/7 Post hosp TOC This patient was seen in return follow-up and is a transition of care visit.  He was hospitalized in May with urosepsis and extended-spectrum bacterial beta-lactamase infection E. Coli  Patient received a full 2-week course of intravenous therapy and had his midline catheter removed this week.  Venous Doppler ultrasound of the upper extremity on the right was normal.  Patient currently states he is improving and has no real specific complaints.  He is paraplegic he does self catheterize.  He follows with urology.  The patient does not have any insurance.  He has been using the same 3 catheters on a cyclic basis and does self catheterize 3 times daily.  He is using soap and water to clean the catheters.  He got a fresh set of 4 catheters about a week ago at urology and they have asked to reuse those for the entire year.  He does not have insurance to purchase catheter supplies.  He is due a urine microalbumin study.  Patient does have diabetes and is taking his current medications.  Patient is on the metformin at 1000 mg twice a day and insulin Basaglar 24 units daily atorvastatin 20 mg daily valsartan HCT 320/25 daily and Januvia daily  Patient states that he is accessing his medications.  Blood sugar today on arrival is slightly elevated There are no other complaints.  Below  is a copy of the discharge summary and also the infectious disease note this week, there was a transition of care visit from the RN that occurred May 26  7/11 This patient is seen in follow-up from his second hospitalization which occurred after I last saw him in early June.  Below is documentation of that hospitalization.  Note this patient will be seeing infectious disease later this morning.  The patient has been reusing his Foley catheters for self-catheterization.  He does need urology follow-up with Dr. [name].  Patient needs an in office cystoscopy according to  the urologist who saw the patient as an inpatient.  He also had bacteremia with an ESBL organism which seeded the muscles of the retroperitoneal area.  He received prolonged course of intravenous antibiotics which is just been completed.  He still has a PICC line in the right upper arm.  Below is a copy of the discharge summary Pt readmitted 6/8: Admit date: 03/19/2022 Discharge date: 03/30/2022   Admitted From: Home Disposition:  Home    Recommendations for Outpatient Follow-up:  1. Follow up with PCP in 1-2 weeks 2. Follow-up with ID as an outpatient         Infectious disease recommendation for antibiotics: Discharge antibiotics to be given via PICC line Discharge antibiotics: Per pharmacy protocol meropene Duration: 4 wk End Date: 04/16/22 [facility] Care Per Protocol: Home health RN for IV administration and teaching; PICC line care and labs.   Labs weekly while on IV antibiotics: _x_ CBC with differential __x BMP _x_ Please pull PIC at completion of IV antibiotics     Fax weekly labs to [phone]   Clinic Follow Up Appt: 04-21-22 10:00 with Dr [name]     @ [facility]       Discharge Condition:Stable CODE STATUS:FULL Diet recommendation: Heart Healthy / Carb Modified    Brief/Interim Summary: John Meza is a 61 y.o. male with history of diabetes mellitus type 2, hypertension, hyperlipidemia, chronic anemia, paraplegia with neurogenic bladder uses self cath was recently admitted for ESBL E. coli bacteremia was recently on IV antibiotics.  He presents to ER  hypotensive with systolic blood pressure in the 80s with fever leukocytosis concerning for sepsis.  -His work-up significant for recurrent bacteremia, thought to be secondary to urinary source, as well MRI spine significant for myositis, TEE is negative for endocarditis.   Severe sepsis secondary to recurrent ESBL bacteremia , and myositis - MRI of spine showing paraspinal myositis,(no discitis or  epidural abscess). -ID input greatly appreciated, for now continue with IV meropenem.   will DC on IV meropenem for total of 4 weeks treatment, with end date 04/16/2022 -2D echo with no evidence of vegetation, TEE with no evidence of endocarditis - previous MD D/W Dr. [name] 6/9 --recommends treatment of urinary infection nonemergent cystoscopy in office and outpatient follow-up with Dr. [name] of urology as an outpatient-he will set this up at this follow-up and family is aware to call and confirm -PICC line inserted 6/15. -Discussed with patient/wife with telemetry interpreter, that patient cannot  REuse his in and out catheters, and they are meant for single use, as reusing may be causing his infection.   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 - Routine management and follow-up  Incidental finding of right hip fracture on imaging upon admission -Input greatly appreciated, not a surgical candidate given he is paraplegic and wheelchair dependent at baseline and insensate and right hip   Diabetes mellitus, type II -Resume home regimen         Discharge Diagnoses:  Active Problems:   HLD (hyperlipidemia)   Paraplegia (HCC)   Neurogenic bladder   Insulin dependent type 2 diabetes mellitus, controlled ([place])   Essential hypertension   Lower urinary tract infectious disease   Sepsis ([place])    In the interim we have been able to secure a supply of clean single use Foley catheters for self-catheterization through a patient assistance fund.  The visit today is accomplished with Spanish video interpreter Genesis [phone]  Patient states he has no back pain or other symptoms at this time.  The wife states there is no outstanding issues.  On arrival blood sugar was only mildly  elevated and blood pressure is good 121/77.  Patient does need lab follow-ups.  8/8 Patient returns today with his wife and visit is accompanied by video Spanish interpreter sayveth [phone] The patient had been back in the emergency room on 15 July with right hip pain found to have a spontaneous right hip fracture he was bathing we ordered a popping sound in his hip.  Nonoperative approach was recommended he was seen by Dr. Loni Muse day or orthopedics.  Note he is uninsured and he is trying to apply for Medicaid but does not have a Arboriculturist and is undocumented immigrant.  He will back to the ER because of swelling in the right hip but no pain because he is paraplegic.  At that time found to have a hematoma in the hip and anemia.  No deep venous thrombosis seen.  Patient comes to the office today needing refills on his iron and his Basaglar insulin.  Blood pressure is good on arrival 111/72 blood sugars are 130.  We went over his medications at a full medication reconciliation with him.  He also met Nira Conn our EMT who will now be seeing him in the para medicine program.  He needs a new supply of catheters as well we will be ordering these as he self catheterizes.  There is been no recurrence of any infection seen with his sepsis.  9/12 Patient seen in return visit history of diabetes but more recently for the past 14 days diarrhea and bloating of the abdomen.  This visit was assisted by Spanish interpreter at [place].  On arrival blood sugar is 77 and he is afebrile.  Patient is having 2-3 stools daily.  Stools are watery and black in nature  He denies significant abdominal pain but does have abdominal bloating. Note he saw EMT Heather and her note is as documented below  Christella Noa EMT : -[facility] financial and [place] card applications submitted per congregational nurse.        SEPT 7TH 2023- Arrived for home visit for John Meza who was laying supine in his hospital bed in his  bedroom, awake and alert oriented X4.  His daughter in the room assisting with translation. He reports feeling okay overall but having some issues with diarrhea and gas with frequent abdominal pain and acid reflux. He reports he has not been taking his Omeprazole and I encouraged him to take this daily to help with the stomach acid. He agreed with this and plans to take it daily 30 mins before his morning meals and  medications. I reviewed all meds and confirmed same. His wife is filling pill box 100% correctly- She continues to fill this weekly.    Refills needed to be called into [facility]- Ferrous Sulfate  (I called in today)    Vitals obtained as noted: BP- 118/68 HR- 85 RR- 16 O2- 98% CBG- 128   He is using cathers 3-4 times daily- they report he has about 30 left. They understand they will need to pay for the next shipment of these and agree with plan.    [place] received paperwork in the mail for disability application. However he is not a legal citizen there for can not receive benefits until he becomes a legal immigrant. Today we contacted [employer] and spoke to a Education officer, museum who advised him to not turn in any disability applications until he meets with an immigration attorney to establish citizenship. [employer] SW Earnestine Mealing offered the walk in hours of Tuesdays and Thursdays 2-6 and Wednesdays 9-1. She also reports there is an event happening in [place] at [facility] on Sept 23 where him and his wife could talk to immigration attorney free of charge. I wrote all this down him and his daughter reports they will go to [employer] on Tuesday. I will continue to assist with this best I can. I advised [name] at [facility] the same information.    I will try to meet [facility] in clinic on Tuesday morning at his visit with Dr. Joya Gaskins. Patient and family expressed no other needs at this time. Home visit complete.           Review of Systems  Constitutional:  Negative for chills,  diaphoresis, fever, malaise/fatigue and weight loss.  HENT:  Negative for congestion, ear discharge, ear pain, hearing loss, nosebleeds, sore throat and tinnitus.   Eyes:  Negative for blurred vision, double vision, photophobia, discharge and redness.  Respiratory:  Negative for cough, hemoptysis, sputum production, shortness of breath, wheezing and stridor.        No excess mucus  Cardiovascular:  Negative for chest pain, palpitations, orthopnea, claudication, leg swelling and PND.  Gastrointestinal:  Positive for abdominal pain, diarrhea, heartburn and melena. Negative for blood in stool, constipation, nausea and vomiting.  Genitourinary:  Negative for dysuria, flank pain, frequency, hematuria and urgency.  Musculoskeletal:  Negative for back pain, falls, joint pain, myalgias and neck pain.  Skin:  Negative for itching and rash.  Neurological:  Negative for dizziness, tingling, tremors, sensory change, speech change, focal weakness, seizures, loss of consciousness, weakness and headaches.  Endo/Heme/Allergies:  Negative for environmental allergies and polydipsia. Does not bruise/bleed easily.  Psychiatric/Behavioral:  Negative for depression, hallucinations, memory loss, substance abuse and suicidal ideas. The patient is not nervous/anxious and does not have insomnia.   All other systems reviewed and are negative.     Objective:     BP 129/77   Pulse 95   Temp 98.3 F (36.8 C)   SpO2 100%    Physical Exam Vitals reviewed.  Constitutional:      Appearance: Normal appearance. He is well-developed. He is obese. He is not diaphoretic.     Comments: Wheelchair-bound  HENT:     Head: Normocephalic and atraumatic.     Nose: No nasal deformity, septal deviation, mucosal edema or rhinorrhea.     Right Sinus: No maxillary sinus tenderness or frontal sinus tenderness.     Left Sinus: No maxillary sinus tenderness or frontal sinus tenderness.  Mouth/Throat:     Pharynx: No  oropharyngeal exudate.  Eyes:     General: No scleral icterus.    Conjunctiva/sclera: Conjunctivae normal.     Pupils: Pupils are equal, round, and reactive to light.  Neck:     Thyroid: No thyromegaly.     Vascular: No carotid bruit or JVD.     Trachea: Trachea normal. No tracheal tenderness or tracheal deviation.  Cardiovascular:     Rate and Rhythm: Normal rate and regular rhythm.     Chest Wall: PMI is not displaced.     Pulses: Normal pulses. No decreased pulses.     Heart sounds: Normal heart sounds, S1 normal and S2 normal. Heart sounds not distant. No murmur heard.    No systolic murmur is present.     No diastolic murmur is present.     No friction rub. No gallop. No S3 or S4 sounds.  Pulmonary:     Effort: No tachypnea, accessory muscle usage or respiratory distress.     Breath sounds: No stridor. No decreased breath sounds, wheezing, rhonchi or rales.  Chest:     Chest wall: No tenderness.  Abdominal:     General: Bowel sounds are normal. There is no distension.     Palpations: Abdomen is soft. Abdomen is not rigid.     Tenderness: There is no abdominal tenderness. There is no guarding or rebound.  Musculoskeletal:        General: Normal range of motion.     Cervical back: Normal range of motion and neck supple. No edema, erythema or rigidity. No muscular tenderness. Normal range of motion.  Lymphadenopathy:     Head:     Right side of head: No submental or submandibular adenopathy.     Left side of head: No submental or submandibular adenopathy.     Cervical: No cervical adenopathy.  Skin:    General: Skin is warm and dry.     Coloration: Skin is not pale.     Findings: No rash.     Nails: There is no clubbing.     Comments: Right upper extremity has a PICC line it appears to be dressed properly and is not infected as a single port  Neurological:     Mental Status: He is alert and oriented to person, place, and time. Mental status is at baseline.     Sensory: No  sensory deficit.     Motor: Weakness present.  Psychiatric:        Speech: Speech normal.        Behavior: Behavior normal.      Results for orders placed or performed in visit on 06/23/22  POCT glucose (manual entry)  Result Value Ref Range   POC Glucose 77 70 - 99 mg/dl      The ASCVD Risk score (Arnett DK, et al., 2019) failed to calculate for the following reasons:   The valid total cholesterol range is 130 to 320 mg/dL    Assessment & Plan:   Problem List Items Addressed This Visit       Digestive   Diarrhea of presumed infectious origin - Primary    Cute onset of diarrhea he has seen a lot of antibiotics recently we will obtain stool for pathogens  We will prescribe empiric Flagyl for 7 days 500 mg 3 times daily      Relevant Orders   C difficile Toxins A+B W/Rflx   Ova and parasite examination   Campylobacter culture,  stool   CBC with Differential/Platelet   Comprehensive metabolic panel     Endocrine   Insulin dependent type 2 diabetes mellitus, controlled (HCC)    No change in insulin program      Relevant Orders   POCT glucose (manual entry) (Completed)   Other Visit Diagnoses     Need for immunization against influenza       Relevant Orders   Flu Vaccine QUAD 83moIM (Fluarix, Fluzone & Alfiuria Quad PF) (Completed)     Flu vaccine was given Return in about 1 month (around 07/23/2022) for chronic conditions.    PAsencion Noble MD

## 2022-06-23 NOTE — Assessment & Plan Note (Signed)
No change in insulin program

## 2022-06-23 NOTE — Patient Instructions (Signed)
No change in other medications  Stool be obtained bring this back to the clinic for processing  Other labs obtained by blood today  Take metronidazole 3 times a day for 7 days  Flu vaccine was given  No other medication changes  Return to see Dr. [name] 1 month

## 2022-06-23 NOTE — Telephone Encounter (Signed)
Thank you Herbert Seta for this important update.   Erskine Squibb so [employer] cannot pay for one more case of catheters then self pay?  Thanks .

## 2022-06-24 LAB — COMPREHENSIVE METABOLIC PANEL
ALT: 9 IU/L (ref 0–44)
AST: 9 IU/L (ref 0–40)
Albumin/Globulin Ratio: 1 — ABNORMAL LOW (ref 1.2–2.2)
Albumin: 3.4 g/dL — ABNORMAL LOW (ref 3.8–4.9)
Alkaline Phosphatase: 124 IU/L — ABNORMAL HIGH (ref 44–121)
BUN/Creatinine Ratio: 15 (ref 9–20)
BUN: 11 mg/dL (ref 6–24)
Bilirubin Total: <0.2 mg/dL (ref 0.0–1.2)
CO2: 18 mmol/L — ABNORMAL LOW (ref 20–29)
Calcium: 9.3 mg/dL (ref 8.7–10.2)
Chloride: 95 mmol/L — ABNORMAL LOW (ref 96–106)
Creatinine, Ser: 0.72 mg/dL — ABNORMAL LOW (ref 0.76–1.27)
Globulin, Total: 3.5 g/dL (ref 1.5–4.5)
Glucose: 79 mg/dL (ref 70–99)
Potassium: 4.8 mmol/L (ref 3.5–5.2)
Sodium: 129 mmol/L — ABNORMAL LOW (ref 134–144)
Total Protein: 6.9 g/dL (ref 6.0–8.5)
eGFR: 105 mL/min/{1.73_m2} (ref >59)

## 2022-06-24 LAB — CBC WITH DIFFERENTIAL/PLATELET
Basophils Absolute: 0 10*3/uL (ref 0.0–0.2)
Basos: 0 %
EOS (ABSOLUTE): 0 10*3/uL (ref 0.0–0.4)
Eos: 0 %
Hematocrit: 25.8 % — ABNORMAL LOW (ref 37.5–51.0)
Hemoglobin: 8 g/dL — ABNORMAL LOW (ref 13.0–17.7)
Immature Grans (Abs): 0.1 10*3/uL (ref 0.0–0.1)
Immature Granulocytes: 1 %
Lymphocytes Absolute: 1.7 10*3/uL (ref 0.7–3.1)
Lymphs: 19 %
MCH: 25 pg — ABNORMAL LOW (ref 26.6–33.0)
MCHC: 31 g/dL — ABNORMAL LOW (ref 31.5–35.7)
MCV: 81 fL (ref 79–97)
Monocytes Absolute: 0.7 10*3/uL (ref 0.1–0.9)
Monocytes: 7 %
Neutrophils Absolute: 6.6 10*3/uL (ref 1.4–7.0)
Neutrophils: 73 %
Platelets: 445 10*3/uL (ref 150–450)
RBC: 3.2 x10E6/uL — ABNORMAL LOW (ref 4.14–5.80)
RDW: 16.5 % — ABNORMAL HIGH (ref 11.6–15.4)
WBC: 9.1 10*3/uL (ref 3.4–10.8)

## 2022-06-24 NOTE — Progress Notes (Signed)
Let pt know liver kidney normal, blood counts stable

## 2022-06-25 ENCOUNTER — Ambulatory Visit: Payer: No Typology Code available for payment source

## 2022-06-26 ENCOUNTER — Telehealth: Payer: Self-pay

## 2022-06-26 NOTE — Telephone Encounter (Signed)
Pt was called and vm was left, Information has been sent to nurse pool.    Interpreter [phone]

## 2022-06-26 NOTE — Telephone Encounter (Signed)
-----   Message from Storm Frisk, MD sent at 06/24/2022  5:54 AM EDT ----- Let pt know liver kidney normal, blood counts stable

## 2022-06-29 LAB — CAMPYLOBACTER CULTURE, STOOL

## 2022-06-29 LAB — C DIFFICILE, CYTOTOXIN B

## 2022-06-29 LAB — OVA AND PARASITE EXAMINATION

## 2022-06-29 LAB — C DIFFICILE TOXINS A+B W/RFLX: C difficile Toxins A+B, EIA: NEGATIVE

## 2022-06-29 NOTE — Progress Notes (Signed)
Let pt know all studies negative for any pathogens in stool

## 2022-06-30 ENCOUNTER — Telehealth: Payer: Self-pay

## 2022-06-30 NOTE — Telephone Encounter (Signed)
After receiving approval from [place], RN/CNP who oversees the [facility], I contacted [facility], spoke to [place] and placed  another order for catheters.   A box of 90 catheters : [employer] ref # [id number] was paid for.  Cost : $53.46 and will be shipped out tomorrow. Patient's delivery address was confirmed and [place] said she will ask her manager to email me a receipt of payment.   Transaction ID: [id number] Auth # [id number]

## 2022-07-01 ENCOUNTER — Telehealth: Payer: Self-pay

## 2022-07-01 NOTE — Telephone Encounter (Signed)
-----   Message from [name], MD sent at 06/29/2022  2:08 PM EDT ----- Let pt know all studies negative for any pathogens in stool

## 2022-07-01 NOTE — Telephone Encounter (Signed)
Pt and wife was called and is aware of results, DOB was confirmed.

## 2022-07-02 ENCOUNTER — Telehealth (HOSPITAL_COMMUNITY): Payer: Self-pay

## 2022-07-02 NOTE — Telephone Encounter (Signed)
Attempted to reach [facility] for home visit without success. I will follow up next week.   Salena Saner, [place] 07/02/2022

## 2022-07-08 ENCOUNTER — Other Ambulatory Visit: Payer: Self-pay

## 2022-07-09 ENCOUNTER — Other Ambulatory Visit: Payer: Self-pay

## 2022-07-09 NOTE — Progress Notes (Signed)
Paramedicine Encounter    Patient ID: John Meza, male    DOB: 06-17-1961, 61 y.o.   MRN: [id number]   Arrived for home visit for [facility] where he was accompanied by his daughter and wife. He was laying in his bed alert and oriented. His daughter assisted with translating.   He denied any pain or complaints today. He has been feeling good overall. He reports he is no longer having diarrhea and is feeling much better. He denied any pain, shortness of breath, dizziness or chest pain. He has been compliant with his medications over the last few weeks and has had no issues filling his own pill box. Him and his wife are very aware of what he takes, how and when.   Vitals: BP- 120/60 HR- 70 O2- 98% RR- 16 CBG- 102   We reviewed upcoming appointments and confirmed same.   He did receive ordered catheters and was very grateful for same.   I talked with them in regards to their status on gaining citizenship and they report they spoke to immigration lawyer and they plan to go to [place] tomorrow to gain knowledge on how to begin the process. I provided the number and address for same. They will text me with an update.   I plan to meet with them in two weeks at the [facility] clinic. They agreed and know to reach out if needed.  Home visit complete.    Salena Saner, [place] 07/09/2022    Patient Care Team: [name], MD as PCP - General (Pulmonary Disease)  Patient Active Problem List   Diagnosis Date Noted   Diarrhea of presumed infectious origin 06/23/2022   Medication monitoring encounter 04/21/2022   Closed fracture of neck of right femur ([place])    Normocytic anemia 03/03/2022   Wheelchair dependence 12/23/2020   History of cholecystectomy 11/18/2020   Insulin dependent type 2 diabetes mellitus, controlled ([place])    Essential hypertension    Neurogenic bladder 01/06/2012   Neurogenic bowel 01/06/2012   Paraplegia ([place]) 11/16/2011    OBESITY, MODERATE 05/26/2007   HLD (hyperlipidemia) 05/25/2007    Current Outpatient Medications:    atorvastatin (LIPITOR) 20 MG tablet, Take 1 tablet (20 mg total) by mouth daily at 12 noon., Disp: 90 tablet, Rfl: 2   ferrous sulfate 325 (65 FE) MG tablet, Take 1 tablet (325 mg total) by mouth daily., Disp: 30 tablet, Rfl: 4   Insulin Glargine (BASAGLAR KWIKPEN) 100 UNIT/ML, Inject 25 Units into the skin at bedtime., Disp: 15 mL, Rfl: 11   Insulin Pen Needle (TECHLITE PEN NEEDLES) 32G X 4 MM MISC, Use as directed to inject insulin, Disp: 100 each, Rfl: 6   metFORMIN (GLUCOPHAGE) 1000 MG tablet, TAKE 1 TABLET (1,000 MG TOTAL) BY MOUTH 2 (TWO) TIMES DAILY WITH A MEAL., Disp: 180 tablet, Rfl: 3   methocarbamol (ROBAXIN) 500 MG tablet, Take 1 tablet (500 mg total) by mouth every 6 (six) hours as needed for muscle spasms., Disp: 90 tablet, Rfl: 2   omeprazole (PRILOSEC) 20 MG capsule, Take 1 capsule by mouth daily to reduce stomach acid, Disp: 30 capsule, Rfl: 11   sitaGLIPtin (JANUVIA) 100 MG tablet, Take 1 tablet by mouth daily, Disp: 90 tablet, Rfl: 3   valsartan-hydrochlorothiazide (DIOVAN-HCT) 320-25 MG tablet, Take 1 tablet by mouth daily., Disp: 90 tablet, Rfl: 3   Vitamin D, Ergocalciferol, (DRISDOL) 1.25 MG (50000 UNIT) CAPS capsule, Take 1 capsule (50,000 Units total) by mouth every 7 (seven) days.,  Disp: 5 capsule, Rfl: 5 Allergies  Allergen Reactions   Macrobid [Nitrofurantoin] Itching and Rash     Social History   Socioeconomic History   Marital status: Married    Spouse name: Not on file   Number of children: Not on file   Years of education: Not on file   Highest education level: Not on file  Occupational History   Occupation: disabled  Tobacco Use   Smoking status: Former    Packs/day: 1.00    Years: 5.00    Total pack years: 5.00    Types: Cigarettes    Quit date: 10/13/2011    Years since quitting: 10.7   Smokeless tobacco: Never   Tobacco comments:    03-24-19  per pt he stopped 1 mo ago   Vaping Use   Vaping Use: Never used  Substance and Sexual Activity   Alcohol use: Not Currently    Alcohol/week: 1.0 standard drink of alcohol    Types: 1 Cans of beer per week   Drug use: Never   Sexual activity: Not on file  Other Topics Concern   Not on file  Social History Narrative   Not on file   Social Determinants of Health   Financial Resource Strain: Not on file  Food Insecurity: Not on file  Transportation Needs: Not on file  Physical Activity: Not on file  Stress: Not on file  Social Connections: Not on file  Intimate Partner Violence: Not on file    Physical Exam      Future Appointments  Date Time Provider [place]  07/23/2022  9:00 AM [name], MD [facility] None  08/03/2022 10:50 AM Gildardo Pounds, NP [facility] None     ACTION: Home visit completed

## 2022-07-14 NOTE — Congregational Nurse Program (Signed)
  Dept: [phone]   Congregational Nurse Program Note  Date of Encounter: 07/14/2022  Past Medical History: Past Medical History:  Diagnosis Date   Cellulitis and abscess of buttock 10/2016   Diabetes mellitus    ESBL (extended spectrum beta-lactamase) producing bacteria infection 03/16/2022   Hyperkalemia 03/16/2022   Hypertension    Myositis 04/21/2022   Paraplegia ([place]) 2013   fell from ladder   Pressure ulcer, buttock, right, unstageable ([place]) 12/30/2020   Pyelonephritis due to Escherichia coli 03/16/2022   Sacral decubitus ulcer, stage IV ([place]) 11/03/2021   SCI (spinal cord injury)    Sepsis due to Escherichia coli (E. coli) ([place]) 03/01/2022   Spine fracture 11/16/2011   T 11- T9-L1    Encounter Details:  CNP Questionnaire - 07/14/22 1525       Questionnaire   Ask client: Do you give verbal consent for me to treat you today? 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    Visit Setting with Client Organization    Patient Status Unknown    Insurance Uninsured ([place]/Care Connects/Self-Pay/Medicaid Family Planning)    Insurance/Financial Assistance Referral [facility] Financial Assistance;[place]s    Medication N/A    Medical Provider Yes    Screening Referrals Made N/A    Medical Referrals Made N/A    Medical Appointment Made N/A    Recently w/o PCP, now 1st time PCP visit completed due to CNs referral or appointment made N/A    Food N/A    Transportation Need transportation assistance    Housing/Utilities N/A    Interpersonal Safety N/A    Interventions Case Management;Navigate Healthcare System    Abnormal to Normal Screening Since Last CN Visit N/A    Screenings CN Performed N/A    Sent Client to Lab for: N/A    Did client attend any of the following based off CNs referral or appointments made? N/A    ED Visit Averted N/A    Life-Saving Intervention Made N/A             Patients ID and letter of  support submitted to "[email or web address]" per [facility] card requirements

## 2022-07-23 ENCOUNTER — Other Ambulatory Visit: Payer: Self-pay

## 2022-07-23 ENCOUNTER — Ambulatory Visit
Payer: No Typology Code available for payment source | Attending: Critical Care Medicine | Admitting: Critical Care Medicine

## 2022-07-23 ENCOUNTER — Telehealth: Payer: Self-pay

## 2022-07-23 ENCOUNTER — Encounter: Payer: Self-pay | Admitting: Critical Care Medicine

## 2022-07-23 VITALS — BP 120/60 | HR 81

## 2022-07-23 DIAGNOSIS — N319 Neuromuscular dysfunction of bladder, unspecified: Secondary | ICD-10-CM

## 2022-07-23 DIAGNOSIS — D500 Iron deficiency anemia secondary to blood loss (chronic): Secondary | ICD-10-CM

## 2022-07-23 DIAGNOSIS — D649 Anemia, unspecified: Secondary | ICD-10-CM

## 2022-07-23 DIAGNOSIS — Z794 Long term (current) use of insulin: Secondary | ICD-10-CM

## 2022-07-23 DIAGNOSIS — I10 Essential (primary) hypertension: Secondary | ICD-10-CM

## 2022-07-23 DIAGNOSIS — E119 Type 2 diabetes mellitus without complications: Secondary | ICD-10-CM

## 2022-07-23 DIAGNOSIS — S72001G Fracture of unspecified part of neck of right femur, subsequent encounter for closed fracture with delayed healing: Secondary | ICD-10-CM

## 2022-07-23 DIAGNOSIS — E1165 Type 2 diabetes mellitus with hyperglycemia: Secondary | ICD-10-CM

## 2022-07-23 DIAGNOSIS — K529 Noninfective gastroenteritis and colitis, unspecified: Secondary | ICD-10-CM

## 2022-07-23 DIAGNOSIS — R197 Diarrhea, unspecified: Secondary | ICD-10-CM

## 2022-07-23 LAB — GLUCOSE, POCT (MANUAL RESULT ENTRY): POC Glucose: 92 mg/dl (ref 70–99)

## 2022-07-23 LAB — POCT GLYCOSYLATED HEMOGLOBIN (HGB A1C): HbA1c, POC (controlled diabetic range): 5.7 % (ref 0.0–7.0)

## 2022-07-23 MED ORDER — FAMOTIDINE 20 MG PO TABS
20.0000 mg | ORAL_TABLET | Freq: Two times a day (BID) | ORAL | 6 refills | Status: DC
  Filled 2022-07-23: qty 60, 30d supply, fill #0
  Filled 2022-09-07: qty 60, 30d supply, fill #1
  Filled 2022-10-02: qty 60, 30d supply, fill #2

## 2022-07-23 MED ORDER — TECHLITE PEN NEEDLES 32G X 4 MM MISC
6 refills | Status: DC
  Filled 2022-07-23: qty 100, 25d supply, fill #0

## 2022-07-23 MED ORDER — METFORMIN HCL 1000 MG PO TABS
1000.0000 mg | ORAL_TABLET | Freq: Two times a day (BID) | ORAL | 3 refills | Status: DC
  Filled 2022-07-23: qty 180, fill #0
  Filled 2022-10-19: qty 180, 90d supply, fill #0

## 2022-07-23 MED ORDER — FERROUS SULFATE 325 (65 FE) MG PO TABS
325.0000 mg | ORAL_TABLET | Freq: Every day | ORAL | 4 refills | Status: DC
  Filled 2022-07-23: qty 30, 30d supply, fill #0

## 2022-07-23 MED ORDER — SITAGLIPTIN PHOSPHATE 100 MG PO TABS
ORAL_TABLET | ORAL | 3 refills | Status: DC
  Filled 2022-07-23: qty 90, fill #0

## 2022-07-23 MED ORDER — METHOCARBAMOL 500 MG PO TABS
500.0000 mg | ORAL_TABLET | Freq: Four times a day (QID) | ORAL | 2 refills | Status: DC | PRN
  Filled 2022-07-23: qty 90, 23d supply, fill #0

## 2022-07-23 MED ORDER — VITAMIN D (ERGOCALCIFEROL) 1.25 MG (50000 UNIT) PO CAPS
50000.0000 [IU] | ORAL_CAPSULE | ORAL | 5 refills | Status: DC
  Filled 2022-07-23: qty 5, 35d supply, fill #0

## 2022-07-23 NOTE — Assessment & Plan Note (Signed)
Managing conservatively

## 2022-07-23 NOTE — Progress Notes (Signed)
Established Patient Office Visit  Subjective   Patient ID: John Meza, male    DOB: 01/31/1961  Age: 61 y.o. MRN: [id number]  Chief Complaint  Patient presents with   Diarrhea   Medication Refill    10/2021 John Meza presents for primary care follow-up and the visit was assisted by Spanish video interpreter John Meza [phone]   Patient has history of spinal cord injury from trauma and is paraplegic wheelchair-bound.  He has had sacral decubiti which have now resolved.  He still has his original wheelchair and is not in good working order and needs a new wheelchair.  We attempted to order this 6 months ago but there was no processing of the order as he is uninsured.   Note the patient did undergo an eye exam on 14 January at the [facility] and it was negative for diabetic retinopathy.  His A1c is 8.0.  His blood sugars at home of been in the 113 130 range.  Blood sugar today is 83.  He does need lab screenings today including liver function renal function.   The patient has no other real complaints except he has occasional back spasms when he lays flat in the bed.  On arrival blood pressure is good 130/80.   4/24 Patient returns in follow-up and now has a new wheelchair with adequate padding.  Sacral decubiti this was provided with the patient assistance line.  This visit was assisted by [place] video interpreter John Meza [phone].  On arrival blood sugar is 97 and he brings his glucometer readings which range from 160-98 with an average of about 130.  Unfortunately despite this his A1c is 8.3 so he clearly has glycemic episodes at times.   He has been maintaining Sitagliptin and metformin along with insulin glargine.  Patient does need refills on medications.  Blood pressure is excellent on arrival 138/74.   Patient does complain of some blood in his urine when he self catheterizes himself he does have follow-up appointment with urology upcoming.  Patient has  no other real complaints.  6/7 Post hosp TOC This patient was seen in return follow-up and is a transition of care visit.  He was hospitalized in May with urosepsis and extended-spectrum bacterial beta-lactamase infection E. Coli  Patient received a full 2-week course of intravenous therapy and had his midline catheter removed this week.  Venous Doppler ultrasound of the upper extremity on the right was normal.  Patient currently states he is improving and has no real specific complaints.  He is paraplegic he does self catheterize.  He follows with urology.  The patient does not have any insurance.  He has been using the same 3 catheters on a cyclic basis and does self catheterize 3 times daily.  He is using soap and water to clean the catheters.  He got a fresh set of 4 catheters about a week ago at urology and they have asked to reuse those for the entire year.  He does not have insurance to purchase catheter supplies.  He is due a urine microalbumin study.  Patient does have diabetes and is taking his current medications.  Patient is on the metformin at 1000 mg twice a day and insulin Basaglar 24 units daily atorvastatin 20 mg daily valsartan HCT 320/25 daily and Januvia daily  Patient states that he is accessing his medications.  Blood sugar today on arrival is slightly elevated There are no other complaints.  Below is a copy of the discharge  summary and also the infectious disease note this week, there was a transition of care visit from the RN that occurred May 26  7/11 This patient is seen in follow-up from his second hospitalization which occurred after I last saw him in early June.  Below is documentation of that hospitalization.  Note this patient will be seeing infectious disease later this morning.  The patient has been reusing his Foley catheters for self-catheterization.  He does need urology follow-up with Dr. Louis Meza.  Patient needs an in office cystoscopy according to the  urologist who saw the patient as an inpatient.  He also had bacteremia with an ESBL organism which seeded the muscles of the retroperitoneal area.  He received prolonged course of intravenous antibiotics which is just been completed.  He still has a PICC line in the right upper arm.  Below is a copy of the discharge summary Pt readmitted 6/8: Admit date: 03/19/2022 Discharge date: 03/30/2022   Admitted From: Home Disposition:  Home    Recommendations for Outpatient Follow-up:  1. Follow up with PCP in 1-2 weeks 2. Follow-up with ID as an outpatient         Infectious disease recommendation for antibiotics: Discharge antibiotics to be given via PICC line Discharge antibiotics: Per pharmacy protocol meropene Duration: 4 wk End Date: 04/16/22 [facility] Per Protocol: Home health RN for IV administration and teaching; PICC line care and labs.   Labs weekly while on IV antibiotics: _x_ CBC with differential __x BMP _x_ Please pull PIC at completion of IV antibiotics     Fax weekly labs to [phone]   Clinic Follow Up Appt: 04-21-22 10:00 with Dr John Meza     @ [facility]       Discharge Condition:Stable CODE STATUS:FULL Diet recommendation: Heart Healthy / Carb Modified    Brief/Interim Summary: John Meza is a 61 y.o. male with history of diabetes mellitus type 2, hypertension, hyperlipidemia, chronic anemia, paraplegia with neurogenic bladder uses self cath was recently admitted for ESBL E. coli bacteremia was recently on IV antibiotics.  He presents to ER  hypotensive with systolic blood pressure in the 80s with fever leukocytosis concerning for sepsis.  -His work-up significant for recurrent bacteremia, thought to be secondary to urinary source, as well MRI spine significant for myositis, TEE is negative for endocarditis.   Severe sepsis secondary to recurrent ESBL bacteremia , and myositis - MRI of spine showing paraspinal myositis,(no discitis or epidural  abscess). -ID input greatly appreciated, for now continue with IV meropenem.   will DC on IV meropenem for total of 4 weeks treatment, with end date 04/16/2022 -2D echo with no evidence of vegetation, TEE with no evidence of endocarditis - previous MD D/W Dr. [name] 6/9 --recommends treatment of urinary infection nonemergent cystoscopy in office and outpatient follow-up with Dr. Louis Meza of urology as an outpatient-he will set this up at this follow-up and family is aware to call and confirm -PICC line inserted 6/15. -Discussed with patient/wife with telemetry interpreter, that patient cannot  REuse his in and out catheters, and they are meant for single use, as reusing may be causing his infection.   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 - Routine management and follow-up    Incidental finding of right hip  fracture on imaging upon admission -Input greatly appreciated, not a surgical candidate given he is paraplegic and wheelchair dependent at baseline and insensate and right hip   Diabetes mellitus, type II -Resume home regimen         Discharge Diagnoses:  Active Problems:   HLD (hyperlipidemia)   Paraplegia (HCC)   Neurogenic bladder   Insulin dependent type 2 diabetes mellitus, controlled ([place])   Essential hypertension   Lower urinary tract infectious disease   Sepsis ([place])    In the interim we have been able to secure a supply of clean single use Foley catheters for self-catheterization through a patient assistance fund.  The visit today is accomplished with Spanish video interpreter [place] [phone]  Patient states he has no back pain or other symptoms at this time.  The wife states there is no outstanding issues.  On arrival blood sugar was only mildly elevated  and blood pressure is good 121/77.  Patient does need lab follow-ups.  8/8 Patient returns today with his wife and visit is accompanied by video Spanish interpreter sayveth [phone] The patient had been back in the emergency room on 15 July with right hip pain found to have a spontaneous right hip fracture he was bathing we ordered a popping sound in his hip.  Nonoperative approach was recommended he was seen by Dr. Loni Muse day or orthopedics.  Note he is uninsured and he is trying to apply for Medicaid but does not have a Arboriculturist and is undocumented immigrant.  He will back to the ER because of swelling in the right hip but no pain because he is paraplegic.  At that time found to have a hematoma in the hip and anemia.  No deep venous thrombosis seen.  Patient comes to the office today needing refills on his iron and his Basaglar insulin.  Blood pressure is good on arrival 111/72 blood sugars are 130.  We went over his medications at a full medication reconciliation with him.  He also met Nira Conn our EMT who will now be seeing him in the para medicine program.  He needs a new supply of catheters as well we will be ordering these as he self catheterizes.  There is been no recurrence of any infection seen with his sepsis.  9/12 Patient seen in return visit history of diabetes but more recently for the past 14 days diarrhea and bloating of the abdomen.  This visit was assisted by Spanish interpreter at [place].  On arrival blood sugar is 77 and he is afebrile.  Patient is having 2-3 stools daily.  Stools are watery and black in nature  He denies significant abdominal pain but does have abdominal bloating. Note he saw EMT Heather and her note is as documented below  Christella Noa EMT : -[facility] financial and orange card applications submitted per congregational nurse.        SEPT 7TH 2023- Arrived for home visit for John Meza who was laying supine in his hospital bed in his bedroom,  awake and alert oriented X4.  His daughter in the room assisting with translation. He reports feeling okay overall but having some issues with diarrhea and gas with frequent abdominal pain and acid reflux. He reports he has not been taking his Omeprazole and I encouraged him to take this daily to help with the stomach acid. He agreed with this and plans to take it daily 30 mins before his morning meals and medications. I reviewed all meds  and confirmed same. His wife is filling pill box 100% correctly- She continues to fill this weekly.    Refills needed to be called into [facility]- Ferrous Sulfate  (I called in today)    Vitals obtained as noted: BP- 118/68 HR- 85 RR- 16 O2- 98% CBG- 128   He is using cathers 3-4 times daily- they report he has about 30 left. They understand they will need to pay for the next shipment of these and agree with plan.    [place] received paperwork in the mail for disability application. However he is not a legal citizen there for can not receive benefits until he becomes a legal immigrant. Today we contacted [employer] and spoke to a Education officer, museum who advised him to not turn in any disability applications until he meets with an immigration attorney to establish citizenship. [employer] SW Earnestine Mealing offered the walk in hours of Tuesdays and Thursdays 2-6 and Wednesdays 9-1. She also reports there is an event happening in [place] at [facility] on Sept 23 where him and his wife could talk to immigration attorney free of charge. I wrote all this down him and his daughter reports they will go to [employer] on Tuesday. I will continue to assist with this best I can. I advised [name] at [facility] the same information.    I will try to meet [facility] in clinic on Tuesday morning at his visit with Dr. Joya Gaskins. Patient and family expressed no other needs at this time. Home visit complete.     10/12 Patient seen in return follow-up with EMT Nira Conn present also assisted  with Spanish interpreter [place].  Patient is needing medication refills.  He is still having diarrhea and abdominal bloating but the diarrhea is less.  He is on a proton pump inhibitor.  Had severe anemia at the last visit with iron deficiency still taking iron tablets.  He will need a gastrointestinal referral.  No other complaints.  He has clean urinary catheters he self catheterizing with.  He has not had urinary tract infections since that time.      Review of Systems  Constitutional:  Negative for chills, diaphoresis, fever, malaise/fatigue and weight loss.  HENT:  Negative for congestion, ear discharge, ear pain, hearing loss, nosebleeds, sore throat and tinnitus.   Eyes:  Negative for blurred vision, double vision, photophobia, discharge and redness.  Respiratory:  Negative for cough, hemoptysis, sputum production, shortness of breath, wheezing and stridor.        No excess mucus  Cardiovascular:  Negative for chest pain, palpitations, orthopnea, claudication, leg swelling and PND.  Gastrointestinal:  Positive for diarrhea and heartburn. Negative for abdominal pain, blood in stool, constipation, melena, nausea and vomiting.       DIARRHEA LESS  Genitourinary:  Negative for dysuria, flank pain, frequency, hematuria and urgency.  Musculoskeletal:  Negative for back pain, falls, joint pain, myalgias and neck pain.  Skin:  Negative for itching and rash.  Neurological:  Negative for dizziness, tingling, tremors, sensory change, speech change, focal weakness, seizures, loss of consciousness, weakness and headaches.  Endo/Heme/Allergies:  Negative for environmental allergies and polydipsia. Does not bruise/bleed easily.  Psychiatric/Behavioral:  Negative for depression, hallucinations, memory loss, substance abuse and suicidal ideas. The patient is not nervous/anxious and does not have insomnia.   All other systems reviewed and are negative.     Objective:     BP 120/60   Pulse  81   SpO2  100%    Physical Exam Vitals reviewed.  Constitutional:      Appearance: Normal appearance. He is well-developed. He is obese. He is not diaphoretic.     Comments: Wheelchair-bound  HENT:     Head: Normocephalic and atraumatic.     Nose: No nasal deformity, septal deviation, mucosal edema or rhinorrhea.     Right Sinus: No maxillary sinus tenderness or frontal sinus tenderness.     Left Sinus: No maxillary sinus tenderness or frontal sinus tenderness.     Mouth/Throat:     Pharynx: No oropharyngeal exudate.  Eyes:     General: No scleral icterus.    Conjunctiva/sclera: Conjunctivae normal.     Pupils: Pupils are equal, round, and reactive to light.  Neck:     Thyroid: No thyromegaly.     Vascular: No carotid bruit or JVD.     Trachea: Trachea normal. No tracheal tenderness or tracheal deviation.  Cardiovascular:     Rate and Rhythm: Normal rate and regular rhythm.     Chest Wall: PMI is not displaced.     Pulses: Normal pulses. No decreased pulses.     Heart sounds: Normal heart sounds, S1 normal and S2 normal. Heart sounds not distant. No murmur heard.    No systolic murmur is present.     No diastolic murmur is present.     No friction rub. No gallop. No S3 or S4 sounds.  Pulmonary:     Effort: No tachypnea, accessory muscle usage or respiratory distress.     Breath sounds: No stridor. No decreased breath sounds, wheezing, rhonchi or rales.  Chest:     Chest wall: No tenderness.  Abdominal:     General: Bowel sounds are normal. There is no distension.     Palpations: Abdomen is soft. Abdomen is not rigid.     Tenderness: There is no abdominal tenderness. There is no guarding or rebound.  Musculoskeletal:        General: Normal range of motion.     Cervical back: Normal range of motion and neck supple. No edema, erythema or rigidity. No muscular tenderness. Normal range of motion.  Lymphadenopathy:     Head:     Right side of head: No submental or  submandibular adenopathy.     Left side of head: No submental or submandibular adenopathy.     Cervical: No cervical adenopathy.  Skin:    General: Skin is warm and dry.     Coloration: Skin is not pale.     Findings: No rash.     Nails: There is no clubbing.     Comments: Right upper extremity has a PICC line it appears to be dressed properly and is not infected as a single port  Neurological:     Mental Status: He is alert and oriented to person, place, and time. Mental status is at baseline.     Sensory: No sensory deficit.     Motor: Weakness present.  Psychiatric:        Speech: Speech normal.        Behavior: Behavior normal.      Results for orders placed or performed in visit on 07/23/22  POCT glucose (manual entry)  Result Value Ref Range   POC Glucose 92 70 - 99 mg/dl  POCT glycosylated hemoglobin (Hb A1C)  Result Value Ref Range   Hemoglobin A1C     HbA1c POC (<> result, manual entry)     HbA1c, POC (prediabetic range)  HbA1c, POC (controlled diabetic range) 5.7 0.0 - 7.0 %      The ASCVD Risk score (Arnett DK, et al., 2019) failed to calculate for the following reasons:   The valid total cholesterol range is 130 to 320 mg/dL    Assessment & Plan:   Problem List Items Addressed This Visit       Cardiovascular and Mediastinum   Essential hypertension    Hypertension currently controlled no changes      Relevant Orders   CBC with Differential/Platelet     Digestive   Diarrhea of presumed infectious origin    Diarrhea work-up unrevealing still having abdominal bloating will refer to gastroenterology and will discontinue proton pump inhibitor and start famotidine twice daily 20 mg        Endocrine   Insulin dependent type 2 diabetes mellitus, controlled ([place]) - Primary    No change in diabetic management diabetes control at goal A1c less than 7      Relevant Medications   Insulin Pen Needle (TECHLITE PEN NEEDLES) 32G X 4 MM MISC   metFORMIN  (GLUCOPHAGE) 1000 MG tablet   sitaGLIPtin (JANUVIA) 100 MG tablet   Other Relevant Orders   POCT glucose (manual entry) (Completed)   POCT glycosylated hemoglobin (Hb A1C) (Completed)     Musculoskeletal and Integument   Closed fracture of neck of right femur (HCC)    Managing conservatively        Other   Neurogenic bladder    Self catheterizing with clean catheters at this time      Normocytic anemia   Relevant Medications   ferrous sulfate 325 (65 FE) MG tablet   Other Visit Diagnoses     Uncontrolled type 2 diabetes mellitus with hyperglycemia (HCC)       Relevant Medications   Insulin Pen Needle (TECHLITE PEN NEEDLES) 32G X 4 MM MISC   metFORMIN (GLUCOPHAGE) 1000 MG tablet   sitaGLIPtin (JANUVIA) 100 MG tablet   Other Relevant Orders   Hemoglobin A1c   BMP8+eGFR   Iron deficiency anemia due to chronic blood loss       Relevant Medications   ferrous sulfate 325 (65 FE) MG tablet   Other Relevant Orders   Iron, TIBC and Ferritin Panel   Ambulatory referral to Gastroenterology   Chronic diarrhea       Relevant Orders   Ambulatory referral to Gastroenterology      Return in about 3 months (around 10/23/2022) for htn, diabetes.    Asencion Noble, MD

## 2022-07-23 NOTE — Assessment & Plan Note (Signed)
Self catheterizing with clean catheters at this time

## 2022-07-23 NOTE — Assessment & Plan Note (Signed)
Hypertension currently controlled no changes

## 2022-07-23 NOTE — Assessment & Plan Note (Signed)
No change in diabetic management diabetes control at goal A1c less than 7

## 2022-07-23 NOTE — Assessment & Plan Note (Signed)
Diarrhea work-up unrevealing still having abdominal bloating will refer to gastroenterology and will discontinue proton pump inhibitor and start famotidine twice daily 20 mg

## 2022-07-23 NOTE — Telephone Encounter (Signed)
I met with the patient and his wife when they were in the clinic today. Spanish interpreter [phone] assisted.  I explained that going forward, they will need to purchase the catheters for patient., the clinic will not be purchasing them.  They can call [facility], phone number provided, and place the order.  His wife confirmed that their daughter can assist with this and would have a credit card for payment. I instructed them not to wait until the supply of catheters is gone but to call for more when there is at least a week's worth left. She said she understood and they did not have any further questions.  The phone number for [facility] was placed on his AVS

## 2022-07-23 NOTE — Patient Instructions (Addendum)
Referral to gastroenterology be made for the diarrhea  No change in medications except stop omeprazole and start famotidine twice a day for your heartburn this will cause less diarrhea  Continue to use the fresh Foley catheters for In-N-Out catheterization  Complete set of lab draws obtained today  Return to Dr. Joya Gaskins 3 months  Se debe derivar a gastroenterologa por diarrea.  No hay cambios en los medicamentos, excepto suspender el omeprazol y Medical laboratory scientific officer con Copywriter, advertising al da para la acidez estomacal, esto causar menos diarrea.  Contine usando catteres Foley nuevos para el cateterismo In-N-Out.  Conjunto completo de sorteos de laboratorio obtenidos hoy.  Regreso al Dr. Joya Gaskins 3 meses

## 2022-07-24 LAB — CBC WITH DIFFERENTIAL/PLATELET
Basophils Absolute: 0 10*3/uL (ref 0.0–0.2)
Basos: 1 %
EOS (ABSOLUTE): 0.1 10*3/uL (ref 0.0–0.4)
Eos: 2 %
Hematocrit: 31 % — ABNORMAL LOW (ref 37.5–51.0)
Hemoglobin: 9.4 g/dL — ABNORMAL LOW (ref 13.0–17.7)
Immature Grans (Abs): 0 10*3/uL (ref 0.0–0.1)
Immature Granulocytes: 1 %
Lymphocytes Absolute: 1.7 10*3/uL (ref 0.7–3.1)
Lymphs: 26 %
MCH: 24.7 pg — ABNORMAL LOW (ref 26.6–33.0)
MCHC: 30.3 g/dL — ABNORMAL LOW (ref 31.5–35.7)
MCV: 82 fL (ref 79–97)
Monocytes Absolute: 0.6 10*3/uL (ref 0.1–0.9)
Monocytes: 9 %
Neutrophils Absolute: 4.1 10*3/uL (ref 1.4–7.0)
Neutrophils: 61 %
Platelets: 328 10*3/uL (ref 150–450)
RBC: 3.8 x10E6/uL — ABNORMAL LOW (ref 4.14–5.80)
RDW: 16.9 % — ABNORMAL HIGH (ref 11.6–15.4)
WBC: 6.5 10*3/uL (ref 3.4–10.8)

## 2022-07-24 LAB — BMP8+EGFR
BUN/Creatinine Ratio: 23 — ABNORMAL HIGH (ref 9–20)
BUN: 18 mg/dL (ref 6–24)
CO2: 18 mmol/L — ABNORMAL LOW (ref 20–29)
Calcium: 9.5 mg/dL (ref 8.7–10.2)
Chloride: 98 mmol/L (ref 96–106)
Creatinine, Ser: 0.78 mg/dL (ref 0.76–1.27)
Glucose: 100 mg/dL — ABNORMAL HIGH (ref 70–99)
Potassium: 4.6 mmol/L (ref 3.5–5.2)
Sodium: 134 mmol/L (ref 134–144)
eGFR: 103 mL/min/{1.73_m2} (ref >59)

## 2022-07-24 LAB — IRON,TIBC AND FERRITIN PANEL
Ferritin: 46 ng/mL (ref 30–400)
Iron Saturation: 11 % — ABNORMAL LOW (ref 15–55)
Iron: 24 ug/dL — ABNORMAL LOW (ref 38–169)
Total Iron Binding Capacity: 222 ug/dL — ABNORMAL LOW (ref 250–450)
UIBC: 198 ug/dL (ref 111–343)

## 2022-07-24 LAB — HEMOGLOBIN A1C
Est. average glucose Bld gHb Est-mCnc: 120 mg/dL
Hgb A1c MFr Bld: 5.8 % — ABNORMAL HIGH (ref 4.8–5.6)

## 2022-07-24 NOTE — Progress Notes (Signed)
Paramedicine Encounter    Patient ID: John Meza, male    DOB: 06-Sep-1961, 61 y.o.   MRN: [id number]   Met with John Meza in the clinic today where he was seen by Dr. Joya Gaskins where he continued to complain about diarrhea, excessive gas and abdominal bloating. He denied any other issues or complaints.   Dr. Joya Gaskins made the following changes:  -Hold ompeprazole for now. -Add Famotidine BID -Labs today  -GI referral placed  -paramedicine to monthly visits   Refills to be picked up: Ferrous Sulfate Vit D Pen Needles Famotidine   [employer] info given to patient and they are agreeable to order next need.   Wife and husband agree they are working with [employer] to get citizen ship. I will follow up in one month unless other wise needed in the mean time. Patient knows to reach out if  needed. Clinic visit complete.   Salena Saner, [place] 07/24/2022       Patient Care Team: [name], MD as PCP - General (Pulmonary Disease)  Patient Active Problem List   Diagnosis Date Noted   Diarrhea of presumed infectious origin 06/23/2022   Medication monitoring encounter 04/21/2022   Closed fracture of neck of right femur ([place])    Normocytic anemia 03/03/2022   Wheelchair dependence 12/23/2020   History of cholecystectomy 11/18/2020   Insulin dependent type 2 diabetes mellitus, controlled ([place])    Essential hypertension    Neurogenic bladder 01/06/2012   Neurogenic bowel 01/06/2012   Paraplegia ([place]) 11/16/2011   OBESITY, MODERATE 05/26/2007   HLD (hyperlipidemia) 05/25/2007    Current Outpatient Medications:    atorvastatin (LIPITOR) 20 MG tablet, Take 1 tablet (20 mg total) by mouth daily at 12 noon., Disp: 90 tablet, Rfl: 2   famotidine (PEPCID) 20 MG tablet, Take 1 tablet (20 mg total) by mouth 2 (two) times daily., Disp: 60 tablet, Rfl: 6   ferrous sulfate 325 (65 FE) MG tablet, Take 1 tablet (325 mg total) by mouth  daily., Disp: 30 tablet, Rfl: 4   Insulin Glargine (BASAGLAR KWIKPEN) 100 UNIT/ML, Inject 25 Units into the skin at bedtime., Disp: 15 mL, Rfl: 11   Insulin Pen Needle (TECHLITE PEN NEEDLES) 32G X 4 MM MISC, Use as directed to inject insulin, Disp: 100 each, Rfl: 6   metFORMIN (GLUCOPHAGE) 1000 MG tablet, TAKE 1 TABLET (1,000 MG TOTAL) BY MOUTH 2 (TWO) TIMES DAILY WITH A MEAL., Disp: 180 tablet, Rfl: 3   methocarbamol (ROBAXIN) 500 MG tablet, Take 1 tablet (500 mg total) by mouth every 6 (six) hours as needed for muscle spasms., Disp: 90 tablet, Rfl: 2   omeprazole (PRILOSEC) 20 MG capsule, Take 1 capsule by mouth daily to reduce stomach acid, Disp: 30 capsule, Rfl: 11   sitaGLIPtin (JANUVIA) 100 MG tablet, Take 1 tablet by mouth daily, Disp: 90 tablet, Rfl: 3   valsartan-hydrochlorothiazide (DIOVAN-HCT) 320-25 MG tablet, Take 1 tablet by mouth daily., Disp: 90 tablet, Rfl: 3   Vitamin D, Ergocalciferol, (DRISDOL) 1.25 MG (50000 UNIT) CAPS capsule, Take 1 capsule (50,000 Units total) by mouth every 7 (seven) days., Disp: 5 capsule, Rfl: 5 Allergies  Allergen Reactions   Macrobid [Nitrofurantoin] Itching and Rash     Social History   Socioeconomic History   Marital status: Married    Spouse name: Not on file   Number of children: Not on file   Years of education: Not on file   Highest education level: Not on file  Occupational History   Occupation: disabled  Tobacco Use   Smoking status: Former    Packs/day: 1.00    Years: 5.00    Total pack years: 5.00    Types: Cigarettes    Quit date: 10/13/2011    Years since quitting: 10.7   Smokeless tobacco: Never   Tobacco comments:    03-24-19 per pt he stopped 1 mo ago   Vaping Use   Vaping Use: Never used  Substance and Sexual Activity   Alcohol use: Not Currently    Alcohol/week: 1.0 standard drink of alcohol    Types: 1 Cans of beer per week   Drug use: Never   Sexual activity: Not on file  Other Topics Concern   Not on file   Social History Narrative   Not on file   Social Determinants of Health   Financial Resource Strain: Not on file  Food Insecurity: Not on file  Transportation Needs: Not on file  Physical Activity: Not on file  Stress: Not on file  Social Connections: Not on file  Intimate Partner Violence: Not on file    Physical Exam      Future Appointments  Date Time Provider [place]  08/03/2022 10:50 AM Gildardo Pounds, NP [facility] None  10/28/2022  9:10 AM [name], MD [facility] None     ACTION: Home visit completed

## 2022-07-24 NOTE — Progress Notes (Signed)
Let pt know A1C at goal great work down to 5.8 from 8.1  luke have been so help ful with this pt adding Yetta Barre for your work as well , jane pls let heather know

## 2022-07-27 ENCOUNTER — Telehealth: Payer: Self-pay

## 2022-07-27 NOTE — Telephone Encounter (Signed)
Pt wife (on dpr) was called and is aware of results, DOB was confirmed. 

## 2022-07-27 NOTE — Telephone Encounter (Signed)
-----   Message from [name], MD sent at 07/24/2022 12:51 PM EDT ----- Let pt know A1C at goal great work down to 5.8 from 8.1  luke have been so help ful with this pt adding Yetta Barre for your work as well , jane pls let heather know

## 2022-08-03 ENCOUNTER — Ambulatory Visit: Payer: No Typology Code available for payment source | Admitting: Critical Care Medicine

## 2022-08-03 ENCOUNTER — Ambulatory Visit: Payer: Self-pay | Admitting: Nurse Practitioner

## 2022-08-05 ENCOUNTER — Other Ambulatory Visit: Payer: Self-pay

## 2022-08-10 ENCOUNTER — Other Ambulatory Visit: Payer: Self-pay

## 2022-09-02 ENCOUNTER — Telehealth (HOSPITAL_COMMUNITY): Payer: Self-pay

## 2022-09-02 NOTE — Telephone Encounter (Signed)
Spoke to Govanni's daughter to confirm home visit for next week. They agreed and call complete.   Maralyn Sago, EMT-Paramedic [phone] 09/02/2022

## 2022-09-07 ENCOUNTER — Other Ambulatory Visit: Payer: Self-pay

## 2022-09-10 ENCOUNTER — Other Ambulatory Visit: Payer: Self-pay

## 2022-09-10 NOTE — Progress Notes (Signed)
Paramedicine Encounter    Patient ID: John Meza, male    DOB: 1961/08/10, 61 y.o.   MRN: [id number]   Arrived for home visit for [name] who reports he is feeling okay overall. Wife reports he is continuing to have upper right thigh swelling. He also has a pressure ulcer on his left ankle bone and right heel. I verbally made Dr. [name] aware and we will be trying to get him in with wound care and ortho. He now has medicaid which is great. ID number provided to Erskine Squibb over the phone.   I obtained vitals and confirmed same.  BP- 120/60 HR- 80 O2- 97% RR- 16  CBG- 140   He has been compliant with his medications over the last months and has been able to pick up medications as needed on his own. Now that he has medicaid they are interested in getting meds sent to place that does delivery/bubble packs. I will inquire about same.   He denied any pain, shortness of breath. Still using catheters daily. He has 30 left and wife and daughters have info re-order when needed.   He is aware of next appointment in Jan.   I educated them on cleaning left ankle wound and how to care for same at home until next provider visit.   I will follow up in two weeks to evaluate same.   Home visit complete.   Maralyn Sago, EMT-Paramedic [phone] 09/10/2022   Patient Care Team: Storm Frisk, MD as PCP - General (Pulmonary Disease)  Patient Active Problem List   Diagnosis Date Noted   Diarrhea of presumed infectious origin 06/23/2022   Medication monitoring encounter 04/21/2022   Closed fracture of neck of right femur (HCC)    Normocytic anemia 03/03/2022   Wheelchair dependence 12/23/2020   History of cholecystectomy 11/18/2020   Insulin dependent type 2 diabetes mellitus, controlled (HCC)    Essential hypertension    Neurogenic bladder 01/06/2012   Neurogenic bowel 01/06/2012   Paraplegia (HCC) 11/16/2011   OBESITY, MODERATE 05/26/2007   HLD (hyperlipidemia)  05/25/2007    Current Outpatient Medications:    atorvastatin (LIPITOR) 20 MG tablet, Take 1 tablet (20 mg total) by mouth daily at 12 noon., Disp: 90 tablet, Rfl: 2   famotidine (PEPCID) 20 MG tablet, Take 1 tablet (20 mg total) by mouth 2 (two) times daily., Disp: 60 tablet, Rfl: 6   ferrous sulfate 325 (65 FE) MG tablet, Take 1 tablet (325 mg total) by mouth daily., Disp: 30 tablet, Rfl: 4   Insulin Glargine (BASAGLAR KWIKPEN) 100 UNIT/ML, Inject 25 Units into the skin at bedtime., Disp: 15 mL, Rfl: 11   Insulin Pen Needle (TECHLITE PEN NEEDLES) 32G X 4 MM MISC, Use as directed to inject insulin, Disp: 100 each, Rfl: 6   metFORMIN (GLUCOPHAGE) 1000 MG tablet, TAKE 1 TABLET (1,000 MG TOTAL) BY MOUTH 2 (TWO) TIMES DAILY WITH A MEAL., Disp: 180 tablet, Rfl: 3   methocarbamol (ROBAXIN) 500 MG tablet, Take 1 tablet (500 mg total) by mouth every 6 (six) hours as needed for muscle spasms., Disp: 90 tablet, Rfl: 2   omeprazole (PRILOSEC) 20 MG capsule, Take 1 capsule by mouth daily to reduce stomach acid, Disp: 30 capsule, Rfl: 11   sitaGLIPtin (JANUVIA) 100 MG tablet, Take 1 tablet by mouth daily, Disp: 90 tablet, Rfl: 3   valsartan-hydrochlorothiazide (DIOVAN-HCT) 320-25 MG tablet, Take 1 tablet by mouth daily., Disp: 90 tablet, Rfl: 3   Vitamin D, Ergocalciferol, (DRISDOL)  1.25 MG (50000 UNIT) CAPS capsule, Take 1 capsule (50,000 Units total) by mouth every 7 (seven) days., Disp: 5 capsule, Rfl: 5 Allergies  Allergen Reactions   Macrobid [Nitrofurantoin] Itching and Rash     Social History   Socioeconomic History   Marital status: Married    Spouse name: Not on file   Number of children: Not on file   Years of education: Not on file   Highest education level: Not on file  Occupational History   Occupation: disabled  Tobacco Use   Smoking status: Former    Packs/day: 1.00    Years: 5.00    Total pack years: 5.00    Types: Cigarettes    Quit date: 10/13/2011    Years since quitting:  10.9   Smokeless tobacco: Never   Tobacco comments:    03-24-19 per pt he stopped 1 mo ago   Vaping Use   Vaping Use: Never used  Substance and Sexual Activity   Alcohol use: Not Currently    Alcohol/week: 1.0 standard drink of alcohol    Types: 1 Cans of beer per week   Drug use: Never   Sexual activity: Not on file  Other Topics Concern   Not on file  Social History Narrative   Not on file   Social Determinants of Health   Financial Resource Strain: Not on file  Food Insecurity: Not on file  Transportation Needs: Not on file  Physical Activity: Not on file  Stress: Not on file  Social Connections: Not on file  Intimate Partner Violence: Not on file    Physical Exam      Future Appointments  Date Time Provider Department Center  10/28/2022  9:10 AM Storm Frisk, MD [facility] None     ACTION: Home visit completed

## 2022-09-11 ENCOUNTER — Telehealth: Payer: Self-pay | Admitting: Critical Care Medicine

## 2022-09-11 DIAGNOSIS — L89523 Pressure ulcer of left ankle, stage 3: Secondary | ICD-10-CM

## 2022-09-11 NOTE — Telephone Encounter (Signed)
Has ulcer on left lateral ankle needs wound care clinic referral I put in order

## 2022-09-15 NOTE — Telephone Encounter (Signed)
Called patient and left voicemail    Interpreter id# [place] [phone]

## 2022-10-01 ENCOUNTER — Telehealth (HOSPITAL_COMMUNITY): Payer: Self-pay

## 2022-10-01 NOTE — Telephone Encounter (Signed)
Attempted to reach Mr. Kittleson for home visit. I will attempt next week.   Maralyn Sago, EMT-Paramedic [phone] 10/01/2022

## 2022-10-02 ENCOUNTER — Other Ambulatory Visit: Payer: Self-pay

## 2022-10-06 ENCOUNTER — Other Ambulatory Visit: Payer: Self-pay

## 2022-10-19 ENCOUNTER — Other Ambulatory Visit: Payer: Self-pay

## 2022-10-21 ENCOUNTER — Other Ambulatory Visit: Payer: Self-pay

## 2022-10-21 NOTE — Progress Notes (Signed)
Paramedicine Encounter    Patient ID: John Meza, male    DOB: 1960/12/21, 62 y.o.   MRN: [id number]   Complaints- NONE   Assessment- A&Ox4, lungs clear, left ankle wound healing, right heel wound healed.   Compliance with meds-taking meds, out of ferrous sulfate   Pill box filled-fills himself/wife (accurate)  Refills needed-metformin, ferrous sulfate   Meds changes since last visit-none     Social changes-needing to apply for medicaid, medicaid card he had was not active. He plans to apply in-person at [facility] on Friday with his wife and daughter. He has to await applying for social security card after daughter turns 16 in December per wife according to [employer]. He was approved for [employer] financial assistance totaling [phone]- he received letter on 10/13/22.   BP 120/72   Pulse 79   Resp 16   SpO2 98%  Weight yesterday-patient cannot weigh Last visit weight-patient cannot weigh   Arrived for [facility] who was laying in his hospital bed inside his room alert and oriented. His daughter by his side translating. He had no complaints, reporting to be feeling good. He has been compliant with his meds but he is out of metformin and ferrous sulfate, both are being refilled at [facility]. Wife plans to pick up this week. He and his wife are filling pill box and it is accurate. Ankle wound on left foot is healing up. Right heel would is improving as well. Vitals obtained as noted. Lungs clear. He is using catheters daily and has recently ordered some with no problems.   The medicaid card he has on hand is not active its issued date is 08-12-2022 however when processed in epic it's showing inactive. He and his wife and daughter plan to go to DSS in person on Friday to apply for Medicaid and explain the reasoning for not being able to apply for SS card and disability until his daughter is 80 which will be in December of this year per [employer] according to his wife. His  DSS caseworker name is listed as Art gallery manager.   He was approved for [employer] financial assistance totaling $[phone]. Letter dated 10/13/22.   I plan to keep John Meza monthly going forward. He is aware of upcoming appointment and plans to be there next week. Time and date reminder sent to daughters phone.   Home visit complete. I will see John Meza in one month pending changes at appointment next week.    John Meza, [place]  ACTION: Home visit completed    Patient Care Team: [name], MD as PCP - General (Pulmonary Disease)  Patient Active Problem List   Diagnosis Date Noted   Diarrhea of presumed infectious origin 06/23/2022   Medication monitoring encounter 04/21/2022   Closed fracture of neck of right femur ([place])    Normocytic anemia 03/03/2022   Wheelchair dependence 12/23/2020   History of cholecystectomy 11/18/2020   Insulin dependent type 2 diabetes mellitus, controlled ([place])    Essential hypertension    Neurogenic bladder 01/06/2012   Neurogenic bowel 01/06/2012   Paraplegia ([place]) 11/16/2011   OBESITY, MODERATE 05/26/2007   HLD (hyperlipidemia) 05/25/2007    Current Outpatient Medications:    atorvastatin (LIPITOR) 20 MG tablet, Take 1 tablet (20 mg total) by mouth daily at 12 noon., Disp: 90 tablet, Rfl: 2   famotidine (PEPCID) 20 MG tablet, Take 1 tablet (20 mg total) by mouth 2 (two) times daily., Disp: 60 tablet,  Rfl: 6   Insulin Glargine (BASAGLAR KWIKPEN) 100 UNIT/ML, Inject 25 Units into the skin at bedtime., Disp: 15 mL, Rfl: 11   Insulin Pen Needle (TECHLITE PEN NEEDLES) 32G X 4 MM MISC, Use as directed to inject insulin, Disp: 100 each, Rfl: 6   metFORMIN (GLUCOPHAGE) 1000 MG tablet, Take 1 tablet (1,000 mg total) by mouth 2 (two) times daily with a meal., Disp: 180 tablet, Rfl: 3   methocarbamol (ROBAXIN) 500 MG tablet, Take 1 tablet (500 mg total) by mouth every 6 (six) hours as needed for muscle spasms., Disp: 90 tablet, Rfl:  2   sitaGLIPtin (JANUVIA) 100 MG tablet, Take 1 tablet by mouth daily, Disp: 90 tablet, Rfl: 3   valsartan-hydrochlorothiazide (DIOVAN-HCT) 320-25 MG tablet, Take 1 tablet by mouth daily., Disp: 90 tablet, Rfl: 3   ferrous sulfate 325 (65 FE) MG tablet, Take 1 tablet (325 mg total) by mouth daily., Disp: 30 tablet, Rfl: 4   omeprazole (PRILOSEC) 20 MG capsule, Take 1 capsule by mouth daily to reduce stomach acid (Patient not taking: Reported on 10/21/2022), Disp: 30 capsule, Rfl: 11   Vitamin D, Ergocalciferol, (DRISDOL) 1.25 MG (50000 UNIT) CAPS capsule, Take 1 capsule (50,000 Units total) by mouth every 7 (seven) days. (Patient not taking: Reported on 09/10/2022), Disp: 5 capsule, Rfl: 5 Allergies  Allergen Reactions   Macrobid [Nitrofurantoin] Itching and Rash     Social History   Socioeconomic History   Marital status: Married    Spouse name: Not on file   Number of children: Not on file   Years of education: Not on file   Highest education level: Not on file  Occupational History   Occupation: disabled  Tobacco Use   Smoking status: Former    Packs/day: 1.00    Years: 5.00    Total pack years: 5.00    Types: Cigarettes    Quit date: 10/13/2011    Years since quitting: 11.0   Smokeless tobacco: Never   Tobacco comments:    03-24-19 per pt he stopped 1 mo ago   Vaping Use   Vaping Use: Never used  Substance and Sexual Activity   Alcohol use: Not Currently    Alcohol/week: 1.0 standard drink of alcohol    Types: 1 Cans of beer per week   Drug use: Never   Sexual activity: Not on file  Other Topics Concern   Not on file  Social History Narrative   Not on file   Social Determinants of Health   Financial Resource Strain: Not on file  Food Insecurity: Not on file  Transportation Needs: Not on file  Physical Activity: Not on file  Stress: Not on file  Social Connections: Not on file  Intimate Partner Violence: Not on file    Physical Exam      Future  Appointments  Date Time Provider [place]  10/28/2022  1:30 PM Argentina Donovan, PA-C [facility] None

## 2022-10-21 NOTE — Telephone Encounter (Signed)
I spoke to John Meza, EMT when she was in the home with the patient. She was able to view the Medicaid card while there. It was issued on 08/12/2022, ID# [id number].  She also Meza that the case # [id number] and the staff person's name at [facility]: Amirah Key.  However, when this Medicaid number  is entered in Epic, the message back states the Medicaid is inactive.   John Meza has spoken to John Meza and she is going to DSS on Friday, 1/12 to inquire more about John eligibility and re-apply if needed.  She will report back to [facility] the outcome of the appointment at [facility].  If there are questions about eligibility, I can make a referral to Legal Aid.     John Meza that the family has been ordering the catheters without any problems.

## 2022-10-22 ENCOUNTER — Other Ambulatory Visit: Payer: Self-pay

## 2022-10-27 ENCOUNTER — Encounter: Payer: Self-pay | Admitting: Critical Care Medicine

## 2022-10-28 ENCOUNTER — Ambulatory Visit: Payer: Self-pay | Admitting: Critical Care Medicine

## 2022-10-28 ENCOUNTER — Other Ambulatory Visit: Payer: Self-pay

## 2022-10-28 ENCOUNTER — Ambulatory Visit: Payer: Self-pay | Attending: Critical Care Medicine | Admitting: Physician Assistant

## 2022-10-28 VITALS — BP 134/80 | HR 81

## 2022-10-28 DIAGNOSIS — E559 Vitamin D deficiency, unspecified: Secondary | ICD-10-CM

## 2022-10-28 DIAGNOSIS — Z794 Long term (current) use of insulin: Secondary | ICD-10-CM

## 2022-10-28 DIAGNOSIS — E119 Type 2 diabetes mellitus without complications: Secondary | ICD-10-CM

## 2022-10-28 DIAGNOSIS — D500 Iron deficiency anemia secondary to blood loss (chronic): Secondary | ICD-10-CM

## 2022-10-28 DIAGNOSIS — I10 Essential (primary) hypertension: Secondary | ICD-10-CM

## 2022-10-28 LAB — GLUCOSE, POCT (MANUAL RESULT ENTRY): POC Glucose: 102 mg/dl — AB (ref 70–99)

## 2022-10-28 MED ORDER — FERROUS SULFATE 325 (65 FE) MG PO TABS
325.0000 mg | ORAL_TABLET | Freq: Every day | ORAL | 4 refills | Status: DC
  Filled 2022-10-28: qty 30, 30d supply, fill #0

## 2022-10-28 NOTE — Progress Notes (Signed)
Patient ID: John Meza, male   DOB: December 12, 1960, 62 y.o.   MRN: [id number]   John Meza, is a 62 y.o. male  SJG:[id number]  [id number]:[id number]  DOB - 1961-03-19  Chief Complaint  Patient presents with   Hypertension   Diabetes       Subjective:   John Meza is a 62 y.o. male here today for iron RF and to check DM.  Blood sugars running from 90-130 from home log.  He is compliant with meds and taking jardiance and 25 basaglar for diabetes.  Does not need RF of other meds at this time. No new issues or concerns No problems updated.  ALLERGIES: Allergies  Allergen Reactions   Macrobid [Nitrofurantoin] Itching and Rash    PAST MEDICAL HISTORY: Past Medical History:  Diagnosis Date   Cellulitis and abscess of buttock 10/2016   Diabetes mellitus    ESBL (extended spectrum beta-lactamase) producing bacteria infection 03/16/2022   Hyperkalemia 03/16/2022   Hypertension    Myositis 04/21/2022   Paraplegia ([place]) 2013   fell from ladder   Pressure ulcer, buttock, right, unstageable ([place]) 12/30/2020   Pyelonephritis due to Escherichia coli 03/16/2022   Sacral decubitus ulcer, stage IV ([place]) 11/03/2021   SCI (spinal cord injury)    Sepsis due to Escherichia coli (E. coli) ([place]) 03/01/2022   Spine fracture 11/16/2011   T 11- T9-L1    MEDICATIONS AT HOME: Prior to Admission medications   Medication Sig Start Date End Date Taking? Authorizing Provider  atorvastatin (LIPITOR) 20 MG tablet Take 1 tablet (20 mg total) by mouth daily at 12 noon. 05/28/22 05/28/23 Yes [name], MD  famotidine (PEPCID) 20 MG tablet Take 1 tablet (20 mg total) by mouth 2 (two) times daily. 07/23/22  Yes [name], MD  Insulin Glargine [facility] KWIKPEN) 100 UNIT/ML Inject 25 Units into the skin at bedtime. 02/02/22 02/02/23 Yes [name], MD  Insulin Pen Needle (TECHLITE PEN NEEDLES) 32G X 4 MM MISC Use as directed to inject insulin 07/23/22  Yes [name], MD   metFORMIN (GLUCOPHAGE) 1000 MG tablet Take 1 tablet (1,000 mg total) by mouth 2 (two) times daily with a meal. 07/23/22  Yes [name], MD  methocarbamol (ROBAXIN) 500 MG tablet Take 1 tablet (500 mg total) by mouth every 6 (six) hours as needed for muscle spasms. 07/23/22  Yes [name], MD  omeprazole (PRILOSEC) 20 MG capsule Take 1 capsule by mouth daily to reduce stomach acid 11/03/21 11/03/22 Yes [name], MD  sitaGLIPtin [facility]) 100 MG tablet Take 1 tablet by mouth daily 07/23/22  Yes [name], MD  valsartan-hydrochlorothiazide (DIOVAN-HCT) 320-25 MG tablet Take 1 tablet by mouth daily. 05/28/22  Yes [name], MD  Vitamin D, Ergocalciferol, (DRISDOL) 1.25 MG (50000 UNIT) CAPS capsule Take 1 capsule (50,000 Units total) by mouth every 7 (seven) days. 07/23/22  Yes [name], MD  ferrous sulfate 325 (65 FE) MG tablet Take 1 tablet (325 mg total) by mouth daily. 10/28/22   Argentina Donovan, PA-C  loratadine (CLARITIN) 10 MG tablet Take 1 tablet (10 mg total) by mouth daily. As needed for itchy throat/allergy symptoms Patient not taking: Reported on 03/24/2019 12/26/18 07/05/20  Fulp, Cammie, MD    ROS: Neg HEENT Neg resp Neg cardiac Neg GI Neg GU Neg MS Neg psych Neg neuro  Objective:   Vitals:   10/28/22 1341  BP: 134/80  Pulse: 81  SpO2: 100%  Exam General appearance : Awake, alert, not in any distress. Speech Clear. Not toxic looking.  In a wheelchair.  Accompanied by wife and daughter.   HEENT: Atraumatic and Normocephalic Neck: Supple, no JVD. No cervical lymphadenopathy.  Chest: Good air entry bilaterally, CTAB.  No rales/rhonchi/wheezing CVS: S1 S2 regular, no murmurs.  Extremities: B/L Lower Ext shows no edema, both legs are warm to touch Neurology: Awake alert, and oriented X 3, CN II-XII intact, Non focal Skin: No Rash  Data Review Lab Results  Component Value Date   HGBA1C 5.8 (H) 07/23/2022   HGBA1C 5.7  07/23/2022   HGBA1C 7.7 (H) 04/21/2022    Assessment & Plan   1. Insulin dependent type 2 diabetes mellitus, controlled (HCC) No change to regimen at this time(will make changes if needed once labs are back - Glucose (CBG) - CBC with Differential/Platelet - Hemoglobin A1c  2. Iron deficiency anemia due to chronic blood loss - Iron, TIBC and Ferritin Panel - ferrous sulfate 325 (65 FE) MG tablet; Take 1 tablet (325 mg total) by mouth daily.  Dispense: 30 tablet; Refill: 4  3. Essential hypertension At goal - CBC with Differential/Platelet  4. Vitamin D deficiency - Vitamin D, 25-hydroxy  [employer] "[name]" interpreters used and additional time performing visit was required.   Return in about 3 months (around 01/27/2023) for PCP for chronic conditions.  The patient was given clear instructions to go to ER or return to medical center if symptoms don't improve, worsen or new problems develop. The patient verbalized understanding. The patient was told to call to get lab results if they haven't heard anything in the next week.      Freeman Caldron, PA-C [facility], [place]   10/28/2022, 1:52 PM

## 2022-10-29 ENCOUNTER — Other Ambulatory Visit: Payer: Self-pay

## 2022-10-29 ENCOUNTER — Other Ambulatory Visit: Payer: Self-pay | Admitting: Physician Assistant

## 2022-10-29 DIAGNOSIS — E1165 Type 2 diabetes mellitus with hyperglycemia: Secondary | ICD-10-CM

## 2022-10-29 DIAGNOSIS — R790 Abnormal level of blood mineral: Secondary | ICD-10-CM

## 2022-10-29 DIAGNOSIS — E559 Vitamin D deficiency, unspecified: Secondary | ICD-10-CM

## 2022-10-29 DIAGNOSIS — D500 Iron deficiency anemia secondary to blood loss (chronic): Secondary | ICD-10-CM

## 2022-10-29 LAB — CBC WITH DIFFERENTIAL/PLATELET
Basophils Absolute: 0 10*3/uL (ref 0.0–0.2)
Basos: 0 %
EOS (ABSOLUTE): 0.1 10*3/uL (ref 0.0–0.4)
Eos: 1 %
Hematocrit: 31.5 % — ABNORMAL LOW (ref 37.5–51.0)
Hemoglobin: 10.1 g/dL — ABNORMAL LOW (ref 13.0–17.7)
Immature Grans (Abs): 0 10*3/uL (ref 0.0–0.1)
Immature Granulocytes: 0 %
Lymphocytes Absolute: 2 10*3/uL (ref 0.7–3.1)
Lymphs: 28 %
MCH: 26.6 pg (ref 26.6–33.0)
MCHC: 32.1 g/dL (ref 31.5–35.7)
MCV: 83 fL (ref 79–97)
Monocytes Absolute: 0.6 10*3/uL (ref 0.1–0.9)
Monocytes: 9 %
Neutrophils Absolute: 4.2 10*3/uL (ref 1.4–7.0)
Neutrophils: 62 %
Platelets: 290 10*3/uL (ref 150–450)
RBC: 3.79 x10E6/uL — ABNORMAL LOW (ref 4.14–5.80)
RDW: 16.9 % — ABNORMAL HIGH (ref 11.6–15.4)
WBC: 6.9 10*3/uL (ref 3.4–10.8)

## 2022-10-29 LAB — IRON,TIBC AND FERRITIN PANEL
Ferritin: 53 ng/mL (ref 30–400)
Iron Saturation: 12 % — ABNORMAL LOW (ref 15–55)
Iron: 25 ug/dL — ABNORMAL LOW (ref 38–169)
Total Iron Binding Capacity: 208 ug/dL — ABNORMAL LOW (ref 250–450)
UIBC: 183 ug/dL (ref 111–343)

## 2022-10-29 LAB — VITAMIN D 25 HYDROXY (VIT D DEFICIENCY, FRACTURES): Vit D, 25-Hydroxy: 24 ng/mL — ABNORMAL LOW (ref 30.0–100.0)

## 2022-10-29 LAB — HEMOGLOBIN A1C
Est. average glucose Bld gHb Est-mCnc: 140 mg/dL
Hgb A1c MFr Bld: 6.5 % — ABNORMAL HIGH (ref 4.8–5.6)

## 2022-10-29 MED ORDER — TECHLITE PEN NEEDLES 32G X 4 MM MISC
6 refills | Status: DC
  Filled 2022-10-29: qty 100, 25d supply, fill #0

## 2022-10-29 MED ORDER — BASAGLAR KWIKPEN 100 UNIT/ML ~~LOC~~ SOPN
28.0000 [IU] | PEN_INJECTOR | Freq: Every day | SUBCUTANEOUS | 11 refills | Status: DC
  Filled 2022-10-29: qty 9, 32d supply, fill #0

## 2022-10-29 MED ORDER — VITAMIN D (ERGOCALCIFEROL) 1.25 MG (50000 UNIT) PO CAPS
50000.0000 [IU] | ORAL_CAPSULE | ORAL | 1 refills | Status: DC
  Filled 2022-10-29: qty 5, 35d supply, fill #0

## 2022-10-30 ENCOUNTER — Telehealth: Payer: Self-pay

## 2022-10-30 NOTE — Telephone Encounter (Signed)
-----  Message from Carilyn Goodpasture, RN sent at 10/29/2022  5:41 PM EST -----  ----- Message ----- From: [place] Dad Sent: 10/29/2022   8:53 AM EST To: Carilyn Goodpasture, RN  Vitamin D is still a little low so I sent him another course of vitamin d to take over the next few weeks.  A1C has gone up to 6.5.  I want him to take 28 units of his insulin daily.  Iron is still low.  Continue iron tablets and I am referring him to a specialist to assess for iron infusion.  Thanks, Freeman Caldron, PA-C

## 2022-10-30 NOTE — Telephone Encounter (Signed)
Pt was called and wife answered (on dpr)is aware of results, DOB was confirmed.

## 2022-11-03 ENCOUNTER — Other Ambulatory Visit: Payer: Self-pay

## 2022-11-04 ENCOUNTER — Other Ambulatory Visit: Payer: Self-pay

## 2022-11-05 NOTE — Telephone Encounter (Signed)
Heather informed me that the patient's daughter went to DSS and was informed that her father is not eligible for Medicaid. Because he is undocumented   I contacted Desiree Lucy, Attorney/ Legal Aid of [place] to inquire if there is any way the patient would be eligible for Medicaid . I asked this a general question and did not mention the patient's name/ demographics.   Zella Ball said that unless a specific program in [place] provides coverage access to an undocumented person, the person would not be eligible for Medicaid or ACA. She does not think there is any coverage for undocumented persons.  The only option she could  think of is finding a free or sliding-scale fee clinic.

## 2022-11-06 ENCOUNTER — Ambulatory Visit: Payer: Self-pay | Admitting: *Deleted

## 2022-11-06 NOTE — Telephone Encounter (Signed)
  Chief Complaint: patient is concerned his insulin dose is too high Symptoms: patient states he feels his insulin dose at night is too high- he takes 25 units and his fasting levels are really low- 64 today Frequency: past few readings 64, 90,71 Pertinent Negatives: Patient denies symptoms- but states he gates very anxious with them being that low in the morning Disposition: [] ED /[] Urgent Care (no appt availability in office) / [] Appointment(In office/virtual)/ []  [facility]/ [] Home Care/ [] Refused Recommended Disposition /[] [facility]/ [x]  Follow-up with PCP Additional Notes: Patient advised I will send concern to provider to see if he needs to adjust his night time dose.

## 2022-11-06 NOTE — Telephone Encounter (Signed)
Spoke with patient. Interpreter line utilized  #[id number] Patient scheduled at [place]. [place]. for follow-up for blood pressured medication.

## 2022-11-06 NOTE — Telephone Encounter (Signed)
Reason for Disposition . [1] Morning (before breakfast) blood glucose < 80 mg/dL (4.4 mmol/L) AND [2] more than once in past week  Answer Assessment - Initial Assessment Questions 1. SYMPTOMS: "What symptoms are you concerned about?"     Low glucose reading- fasting in am- 64 today, 90,71 if uses insulin 2. ONSET:  "When did the symptoms start?"     No symptoms- just feels anxious regarding the reading 3. BLOOD GLUCOSE: "What is your blood glucose level?"      94-patient has eaten today- tortilla and rice- 12:30 4. USUAL RANGE: "What is your blood glucose level usually?" (e.g., usual fasting morning value, usual evening value)     Patient states he has higher levels if he does not use insulin at night- but he feels the dose 25 units is too high and dropping his fasting level too much 5. TYPE 1 or 2:  "Do you know what type of diabetes you have?"  (e.g., Type 1, Type 2, Gestational; doesn't know)      Type 2 6. INSULIN: "Do you take insulin?" "What type of insulin(s) do you use? What is the mode of delivery? (syringe, pen; injection or pump) "When did you last give yourself an insulin dose?" (i.e., time or hours/minutes ago) "How much did you give?" (i.e., how many units)     Basaglar 25 unit at night 7. DIABETES PILLS: "Do you take any pills for your diabetes?" If Yes, ask: "What is the name of the medicine(s) that you take for high blood sugar?"     Metformin  8. OTHER SYMPTOMS: "Do you have any symptoms?" (e.g., fever, frequent urination, difficulty breathing, vomiting)     no 9. LOW BLOOD GLUCOSE TREATMENT: "What have you done so far to treat the low blood glucose level?"     Patient eats and it does come up 10. FOOD: "When did you last eat or drink?"       Tortilla and rice- 12:30 11. ALONE: "Are you alone right now or is someone with you?"        Not alone- male with patient  Protocols used: Diabetes - Low Blood Sugar-A-AH

## 2022-11-06 NOTE — Telephone Encounter (Signed)
Very unfortunate, I suppose [employer] cannot pay for at least one visit to wound care clinic??

## 2022-11-09 NOTE — Progress Notes (Signed)
Patient ID: John Meza, male    DOB: 06/22/1961  MRN: [id number]  CC: Follow-Up  Subjective: John Meza is a 62 y.o. male who presents for follow-up. He is accompanied by his wife.   His concerns today include:  11/06/2022 per triage RN note: Spoke with patient. Interpreter line utilized  #[id number] Patient scheduled at [place]. [place]. for follow-up for blood pressured medication.       Chief Complaint: patient is concerned his insulin dose is too high Symptoms: patient states he feels his insulin dose at night is too high- he takes 25 units and his fasting levels are really low- 64 today Frequency: past few readings 64, 90,71 Pertinent Negatives: Patient denies symptoms- but states he gates very anxious with them being that low in the morning Disposition: [] ED /[] Urgent Care (no appt availability in office) / [] Appointment(In office/virtual)/ []  [facility]/ [] Home Care/ [] Refused Recommended Disposition /[] [facility]/ [x]  Follow-up with PCP Additional Notes: Patient advised I will send concern to provider to see if he needs to adjust his night time dose.       Reason for Disposition  [1] Morning (before breakfast) blood glucose < 80 mg/dL (4.4 mmol/L) AND [2] more than once in past week  Answer Assessment - Initial Assessment Questions 1. SYMPTOMS: "What symptoms are you concerned about?"     Low glucose reading- fasting in am- 64 today, 90,71 if uses insulin 2. ONSET:  "When did the symptoms start?"     No symptoms- just feels anxious regarding the reading 3. BLOOD GLUCOSE: "What is your blood glucose level?"      94-patient has eaten today- tortilla and rice- 12:30 4. USUAL RANGE: "What is your blood glucose level usually?" (e.g., usual fasting morning value, usual evening value)     Patient states he has higher levels if he does not use insulin at night- but he feels the dose 25 units is too high and dropping his fasting  level too much 5. TYPE 1 or 2:  "Do you know what type of diabetes you have?"  (e.g., Type 1, Type 2, Gestational; doesn't know)      Type 2 6. INSULIN: "Do you take insulin?" "What type of insulin(s) do you use? What is the mode of delivery? (syringe, pen; injection or pump) "When did you last give yourself an insulin dose?" (i.e., time or hours/minutes ago) "How much did you give?" (i.e., how many units)     Basaglar 25 unit at night 7. DIABETES PILLS: "Do you take any pills for your diabetes?" If Yes, ask: "What is the name of the medicine(s) that you take for high blood sugar?"     Metformin  8. OTHER SYMPTOMS: "Do you have any symptoms?" (e.g., fever, frequent urination, difficulty breathing, vomiting)     no 9. LOW BLOOD GLUCOSE TREATMENT: "What have you done so far to treat the low blood glucose level?"     Patient eats and it does come up 10. FOOD: "When did you last eat or drink?"       Tortilla and rice- 12:30 11. ALONE: "Are you alone right now or is someone with you?"        Not alone- male with patient   Today's visit 11/10/2022: Patient's last appointment 10/28/2022 at [facility] with Freeman Caldron, [place]. During that visit patient was seen for diabetes management. His hemoglobin A1c resulted 6.5% which was increased from previous 5.8%. Lab results  review indicates that John Meza instructed patient to increase daily insulin from 25 units daily to 28 units daily. Today patient states that he was having low blood sugars before the insulin dose was increased but he forgot to tell [place], [place] during his appointment with her. Patient reports he self-discontinued insulin around 7 days ago. Since then blood sugars are 80's while fasting. He states he feels back to normal since no longer having low blood sugars. He is taking Metformin and Jardiance as prescribed for diabetes management. Patient and patient's wife confirmed they do not having any questions  regarding blood pressure medication and no further issues/concerns for discussion on today.   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 90 tablet 2   famotidine (PEPCID) 20 MG tablet Take 1 tablet (20 mg total) by mouth 2 (two) times daily. 60 tablet 6   ferrous sulfate 325 (65 FE) MG tablet Take 1 tablet (325 mg total) by mouth daily. 30 tablet 4   Insulin Glargine (BASAGLAR KWIKPEN) 100 UNIT/ML Inject 28 Units into the skin at bedtime. 15 mL 11   Insulin Pen Needle (TECHLITE PEN NEEDLES) 32G X 4 MM MISC Use as directed to inject insulin 100 each 6   metFORMIN (GLUCOPHAGE) 1000 MG tablet Take 1 tablet (1,000 mg total) by mouth 2 (two) times daily with a meal. 180 tablet 3   methocarbamol (ROBAXIN) 500 MG tablet Take 1 tablet (500 mg total) by mouth every 6 (six) hours as needed for muscle spasms. 90 tablet 2   sitaGLIPtin (JANUVIA) 100 MG tablet Take 1 tablet by mouth daily 90 tablet 3   valsartan-hydrochlorothiazide (DIOVAN-HCT) 320-25 MG tablet Take 1 tablet by mouth daily. 90 tablet 3   Vitamin D, Ergocalciferol, (DRISDOL) 1.25 MG (50000 UNIT) CAPS capsule Take 1 capsule (50,000 Units total) by mouth every 7 (seven) days. 5 capsule 1   omeprazole (PRILOSEC) 20 MG capsule Take 1 capsule by mouth daily to  reduce stomach acid 30 capsule 11   [DISCONTINUED] loratadine (CLARITIN) 10 MG tablet Take 1 tablet (10 mg total) by mouth daily. As needed for itchy throat/allergy symptoms (Patient not taking: Reported on 03/24/2019) 30 tablet 11   No current facility-administered medications on file prior to visit.    Allergies  Allergen Reactions   Macrobid [Nitrofurantoin] Itching and Rash    Social History   Socioeconomic History   Marital status: Married    Spouse name: Not on file   Number of children: Not on file   Years of education: Not on file   Highest education level: Not on file  Occupational History   Occupation: disabled  Tobacco Use   Smoking status: Former    Packs/day: 1.00    Years: 5.00    Total pack years: 5.00  Types: Cigarettes    Quit date: 10/13/2011    Years since quitting: 11.0   Smokeless tobacco: Never   Tobacco comments:    03-24-19 per pt he stopped 1 mo ago   Vaping Use   Vaping Use: Never used  Substance and Sexual Activity   Alcohol use: Not Currently    Alcohol/week: 1.0 standard drink of alcohol    Types: 1 Cans of beer per week   Drug use: Never   Sexual activity: Not on file  Other Topics Concern   Not on file  Social History Narrative   Not on file   Social Determinants of Health   Financial Resource Strain: Not on file  Food Insecurity: Not on file  Transportation Needs: Not on file  Physical Activity: Not on file  Stress: Not on file  Social Connections: Not on file  Intimate Partner Violence: Not on file    Family History  Problem Relation Age of Onset   Healthy Mother    Diabetes Father    Diabetes Sister     Past Surgical History:  Procedure Laterality Date   CHOLECYSTECTOMY N/A 05/01/2015   Procedure: LAPAROSCOPIC CHOLECYSTECTOMY WITH ATTEMPTED INTRAOPERATIVE CHOLANGIOGRAM;  Surgeon: Georganna Skeans, MD;  Location: [facility];  Service: General;  Laterality: N/A;   SPINE SURGERY     TEE WITHOUT CARDIOVERSION N/A 03/26/2022    Procedure: TRANSESOPHAGEAL ECHOCARDIOGRAM (TEE);  Surgeon: Thayer Headings, MD;  Location: [facility] ENDOSCOPY;  Service: Cardiovascular;  Laterality: N/A;    ROS: Review of Systems Negative except as stated above  PHYSICAL EXAM: BP 124/76 (BP Location: Right Arm, Patient Position: Sitting, Cuff Size: Normal)   Pulse 86   Temp 97.8 F (36.6 C) (Oral)   Resp 16   SpO2 99%   Physical Exam HENT:     Head: Normocephalic and atraumatic.  Eyes:     Extraocular Movements: Extraocular movements intact.     Conjunctiva/sclera: Conjunctivae normal.     Pupils: Pupils are equal, round, and reactive to light.  Cardiovascular:     Rate and Rhythm: Normal rate and regular rhythm.     Pulses: Normal pulses.     Heart sounds: Normal heart sounds.  Pulmonary:     Effort: Pulmonary effort is normal.     Breath sounds: Normal breath sounds.  Musculoskeletal:     Cervical back: Normal range of motion and neck supple.  Neurological:     General: No focal deficit present.     Mental Status: He is alert and oriented to person, place, and time.  Psychiatric:        Mood and Affect: Mood normal.        Behavior: Behavior normal.      ASSESSMENT AND PLAN: 1. Insulin dependent type 2 diabetes mellitus, controlled ([place]) 2. Hypoglycemia - Patient's last appointment was on 10/28/2022 at [facility] with Freeman Caldron, [place]. During that visit patient was seen for diabetes management. His hemoglobin A1c resulted 6.5% which was increased from previous 5.8%. Lab results review indicates that Freeman Caldron, [place] instructed patient to increase daily Insulin Glargine from 25 units daily to 28 units daily.  - Today patient reports he self-discontinued Insulin Glargine 7 days ago and and fasting blood sugars are now in the 80's with no side effects.  - Today patient instructed to continue to hold Insulin Glargine for now. - Continue Metformin and Sitagliptin as prescribed. No refills needed  as of present.  - Discussed the  importance of healthy eating habits, low-carbohydrate diet, low-sugar diet, regular aerobic exercise (at least 150 minutes a week as tolerated) and medication compliance to achieve or maintain control of diabetes. - Follow-up with primary provider Asencion Noble, MD in 2 weeks or sooner if needed.   3. Language barrier - [employer]. Name: Cecelia Byars   ID#: [id number]   Patient was given the opportunity to ask questions.  Patient verbalized understanding of the plan and was able to repeat key elements of the plan. Patient was given clear instructions to go to Emergency Department or return to medical center if symptoms don't improve, worsen, or new problems develop.The patient verbalized understanding.   Return in about 2 weeks (around 11/24/2022) for Follow-Up or next available with Asencion Noble, MD .  Camillia Herter, NP

## 2022-11-10 ENCOUNTER — Ambulatory Visit (INDEPENDENT_AMBULATORY_CARE_PROVIDER_SITE_OTHER): Payer: Self-pay | Admitting: Family

## 2022-11-10 ENCOUNTER — Other Ambulatory Visit: Payer: Self-pay

## 2022-11-10 VITALS — BP 124/76 | HR 86 | Temp 97.8°F | Resp 16

## 2022-11-10 DIAGNOSIS — Z794 Long term (current) use of insulin: Secondary | ICD-10-CM

## 2022-11-10 DIAGNOSIS — E162 Hypoglycemia, unspecified: Secondary | ICD-10-CM

## 2022-11-10 DIAGNOSIS — Z789 Other specified health status: Secondary | ICD-10-CM

## 2022-11-10 DIAGNOSIS — E119 Type 2 diabetes mellitus without complications: Secondary | ICD-10-CM

## 2022-11-10 NOTE — Progress Notes (Signed)
Hypoglycemic episodes Insulin was adjusted to 25 units. Still has low reading so discontinued x 7- 8 days

## 2022-11-11 ENCOUNTER — Other Ambulatory Visit: Payer: Self-pay

## 2022-11-13 NOTE — Progress Notes (Unsigned)
[facility]   Telephone:([phone]) [phone] Fax:([phone]) [place] consult Note   Patient Care Team: [name], MD as PCP - General (Pulmonary Disease) Truitt Merle, MD as Consulting Physician (Hematology) 11/14/2022  CHIEF COMPLAINTS/PURPOSE OF CONSULTATION:  Anemia   REFERRAL PHYSICIAN: Argentina Donovan NP    HISTORY OF PRESENTING ILLNESS:  John Meza 62 y.o. male is here because of anemia.  He was referred by primary care physician nurse practitioner Freeman Caldron.  Patient presents to the clinic with his wife and daughter and interpreter.  He came in a wheelchair.  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  He also has no control of obvious urine and bowel movement.  He is a straight catheter.  He also has type 2 diabetes for many years for which he is on insulin and oral medicine.  He was found to have mild intermittent anemia since 2016, more since October 2022.  He developed severe anemia since summer 2023, when he was admitted for sepsis and bacteremia in June 2023, and multiple ED visit in July and August 2023 for right hip fracture, and leg edema.  His hemoglobin was around 7-9 during that..  He denies any blood transfusion.  He started oral iron in June 2023, and has been compliant with once a day.  His hemoglobin gradually improved, CBC from October 28, 2022 showed WBC 6.9, hemoglobin 10.1, MCV 83, platelet 290.  Iron study on same day showed serum iron 24, TIBC 222, and saturation 11%.  Ferritin was 46.  Patient has normal renal function.  Absolute reticulocyte count was 28 in May 2023.  B12 level was normal at 532 in May 2023.  Patient denies any signs of bleeding, not sure if he had a colonoscopy or not.  MEDICAL HISTORY:  Past Medical History:  Diagnosis Date   Cellulitis and abscess of buttock 10/2016   Diabetes mellitus    ESBL (extended spectrum beta-lactamase)  producing bacteria infection 03/16/2022   Hyperkalemia 03/16/2022   Hypertension    Myositis 04/21/2022   Paraplegia ([place]) 2013   fell from ladder   Pressure ulcer, buttock, right, unstageable ([place]) 12/30/2020   Pyelonephritis due to Escherichia coli 03/16/2022   Sacral decubitus ulcer, stage IV ([place]) 11/03/2021   SCI (spinal cord injury)    Sepsis due to Escherichia coli (E. coli) ([place]) 03/01/2022   Spine fracture 11/16/2011   T 11- T9-L1    SURGICAL HISTORY: Past Surgical History:  Procedure Laterality Date   CHOLECYSTECTOMY N/A 05/01/2015   Procedure: LAPAROSCOPIC CHOLECYSTECTOMY WITH ATTEMPTED INTRAOPERATIVE CHOLANGIOGRAM;  Surgeon: Georganna Skeans, MD;  Location: [facility];  Service: General;  Laterality: N/A;   SPINE SURGERY     TEE WITHOUT CARDIOVERSION N/A 03/26/2022   Procedure: TRANSESOPHAGEAL ECHOCARDIOGRAM (TEE);  Surgeon: Acie Fredrickson Wonda Cheng, MD;  Location[facility] ENDOSCOPY;  Service: Cardiovascular;  Laterality: N/A;    SOCIAL HISTORY: Social History   Socioeconomic History   Marital status: Married    Spouse name: Not on file   Number of children: Not on file   Years of education: Not on file   Highest education level: Not on file  Occupational History   Occupation: disabled  Tobacco Use   Smoking status: Former    Packs/day: 1.00    Years: 5.00    Total pack years: 5.00    Types: Cigarettes    Quit date: 10/13/2011    Years  since quitting: 11.0   Smokeless tobacco: Never   Tobacco comments:    03-24-19 per pt he stopped 1 mo ago   Vaping Use   Vaping Use: Never used  Substance and Sexual Activity   Alcohol use: Not Currently    Alcohol/week: 1.0 standard drink of alcohol    Types: 1 Cans of beer per week   Drug use: Never   Sexual activity: Not on file  Other Topics Concern   Not on file  Social History Narrative   Not on file   Social Determinants of Health   Financial Resource Strain: Not on file  Food Insecurity: Not on file  Transportation Needs: Not on file   Physical Activity: Not on file  Stress: Not on file  Social Connections: Not on file  Intimate Partner Violence: Not on file    FAMILY HISTORY: Family History  Problem Relation Age of Onset   Healthy Mother    Diabetes Father    Diabetes Sister     ALLERGIES:  is allergic to macrobid [nitrofurantoin].  MEDICATIONS:  Current Outpatient Medications  Medication Sig Dispense Refill   atorvastatin (LIPITOR) 20 MG tablet Take 1 tablet (20 mg total) by mouth daily at 12 noon. 90 tablet 2   famotidine (PEPCID) 20 MG tablet Take 1 tablet (20 mg total) by mouth 2 (two) times daily. 60 tablet 6   ferrous sulfate 325 (65 FE) MG tablet Take 1 tablet (325 mg total) by mouth daily. 30 tablet 4   Insulin Glargine (BASAGLAR KWIKPEN) 100 UNIT/ML Inject 28 Units into the skin at bedtime. 15 mL 11   Insulin Pen Needle (TECHLITE PEN NEEDLES) 32G X 4 MM MISC Use as directed to inject insulin 100 each 6   metFORMIN (GLUCOPHAGE) 1000 MG tablet Take 1 tablet (1,000 mg total) by mouth 2 (two) times daily with a meal. 180 tablet 3   methocarbamol (ROBAXIN) 500 MG tablet Take 1 tablet (500 mg total) by mouth every 6 (six) hours as needed for muscle spasms. 90 tablet 2   sitaGLIPtin (JANUVIA) 100 MG tablet Take 1 tablet by mouth daily 90 tablet 3   valsartan-hydrochlorothiazide (DIOVAN-HCT) 320-25 MG tablet Take 1 tablet by mouth daily. 90 tablet 3   Vitamin D, Ergocalciferol, (DRISDOL) 1.25 MG (50000 UNIT) CAPS capsule Take 1 capsule (50,000 Units total) by mouth every 7 (seven) days. 5 capsule 1   No current facility-administered medications for this visit.    REVIEW OF SYSTEMS:   Constitutional: Denies fevers, chills or abnormal night sweats Eyes: Denies blurriness of vision, double vision or watery eyes Ears, nose, mouth, throat, and face: Denies mucositis or sore throat Respiratory: Denies cough, dyspnea or wheezes Cardiovascular: Denies palpitation, chest discomfort or lower extremity  swelling Gastrointestinal:  Denies nausea, heartburn or change in bowel habits Skin: Denies abnormal skin rashes Lymphatics: Denies new lymphadenopathy or easy bruising Neurological:Denies numbness, tingling or new weaknesses Behavioral/Psych: Mood is stable, no new changes  All other systems were reviewed with the patient and are negative.  PHYSICAL EXAMINATION: ECOG PERFORMANCE STATUS: 3 - Symptomatic, >50% confined to bed  Vitals:   11/14/22 0934  BP: 138/84  Pulse: 87  Resp: 18  Temp: 98 F (36.7 C)  SpO2: 100%   Filed Weights    GENERAL:alert, no distress and comfortable SKIN: skin color, texture, turgor are normal, no rashes or significant lesions EYES: normal, conjunctiva are pink and non-injected, sclera clear OROPHARYNX:no exudate, no erythema and lips, buccal mucosa, and tongue normal  NECK: supple, thyroid normal size, non-tender, without nodularity LYMPH:  no palpable lymphadenopathy in the cervical, axillary or inguinal LUNGS: clear to auscultation and percussion with normal breathing effort HEART: regular rate & rhythm and no murmurs and no lower extremity edema ABDOMEN:abdomen soft, non-tender and normal bowel sounds Musculoskeletal:no cyanosis of digits and no clubbing  PSYCH: alert & oriented x 3 with fluent speech NEURO: no focal motor/sensory deficits  LABORATORY DATA:  I have reviewed the data as listed    Latest Ref Rng & Units 10/28/2022    2:03 PM 07/23/2022   10:06 AM 06/23/2022   10:20 AM  CBC  WBC 3.4 - 10.8 x10E3/uL 6.9  6.5  9.1   Hemoglobin 13.0 - 17.7 g/dL 10.1  9.4  8.0   Hematocrit 37.5 - 51.0 % 31.5  31.0  25.8   Platelets 150 - 450 x10E3/uL 290  328  445        Latest Ref Rng & Units 07/23/2022   10:06 AM 06/23/2022   10:20 AM 05/19/2022   10:39 AM  CMP  Glucose 70 - 99 mg/dL 100  79  124   BUN 6 - 24 mg/dL 18  11  20    Creatinine 0.76 - 1.27 mg/dL 0.78  0.72  0.95   Sodium 134 - 144 mmol/L 134  129  124   Potassium 3.5 - 5.2  mmol/L 4.6  4.8  5.6   Chloride 96 - 106 mmol/L 98  95  91   CO2 20 - 29 mmol/L 18  18  15    Calcium 8.7 - 10.2 mg/dL 9.5  9.3  9.1   Total Protein 6.0 - 8.5 g/dL  6.9  6.8   Total Bilirubin 0.0 - 1.2 mg/dL  <0.2  0.3   Alkaline Phos 44 - 121 IU/L  124  156   AST 0 - 40 IU/L  9  16   ALT 0 - 44 IU/L  9  19      RADIOGRAPHIC STUDIES: I have personally reviewed the radiological images as listed and agreed with the findings in the report. No results found.  ASSESSMENT & PLAN:  Iron deficiency anemia, unspecified -His anemia is most likely related to his chronic disease, especially diabetes and complications. -He does have low serum iron, low TIBC, ferritin has been normal in most tests, he probably has component of iron deficiency also, but his iron study is most consistent with anemia of chronic disease. -I will continue his oral iron, we discussed take his iron pill in between Pepcid, and avoid PPI.  I encouraged him to take vitamin C along with iron pill. -His anemia has improved significantly since last year, I do not feel strongly he needs IV iron at this point.  Continue to monitor his lab  Anemia of chronic disease -His iron study supports anemia of chronic disease, most likely related to his infection in June 2023, and long-term diabetes. -Anemia has improved with oral iron and medical management of his chronic disease. 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  However if his anemia does not improve further, I would add an additional lab work for his anemia workup.  Plan -Continue oral iron pill once daily -Repeat labs in 2 months, lab and follow-up in 4 months -Will decide if he needs IV iron or additional anemia workup based on future lab results   All  questions were answered. The patient knows to call the clinic with any problems, questions or  concerns. I spent 40 minutes counseling the patient face to face. The total time spent in the appointment was 45 minutes and more than 50% was on counseling.     Malachy Mood, MD 11/14/22 10:12 AM

## 2022-11-14 ENCOUNTER — Inpatient Hospital Stay: Payer: Self-pay

## 2022-11-14 ENCOUNTER — Encounter: Payer: Self-pay | Admitting: Hematology

## 2022-11-14 ENCOUNTER — Inpatient Hospital Stay: Payer: Self-pay | Attending: Hematology | Admitting: Hematology

## 2022-11-14 VITALS — BP 138/84 | HR 87 | Temp 98.0°F | Resp 18 | Ht 64.0 in

## 2022-11-14 DIAGNOSIS — Z794 Long term (current) use of insulin: Secondary | ICD-10-CM | POA: Insufficient documentation

## 2022-11-14 DIAGNOSIS — Z7984 Long term (current) use of oral hypoglycemic drugs: Secondary | ICD-10-CM | POA: Insufficient documentation

## 2022-11-14 DIAGNOSIS — E119 Type 2 diabetes mellitus without complications: Secondary | ICD-10-CM | POA: Insufficient documentation

## 2022-11-14 DIAGNOSIS — D509 Iron deficiency anemia, unspecified: Secondary | ICD-10-CM

## 2022-11-14 DIAGNOSIS — D649 Anemia, unspecified: Secondary | ICD-10-CM

## 2022-11-14 DIAGNOSIS — D638 Anemia in other chronic diseases classified elsewhere: Secondary | ICD-10-CM | POA: Insufficient documentation

## 2022-11-14 DIAGNOSIS — D500 Iron deficiency anemia secondary to blood loss (chronic): Secondary | ICD-10-CM | POA: Insufficient documentation

## 2022-11-14 NOTE — Assessment & Plan Note (Signed)
-  His iron study supports anemia of chronic disease, most likely related to his infection in June 2023, and long-term diabetes. -Anemia has improved with oral iron and medical management of his chronic disease. 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  However if his anemia does not improve further, I would add an additional lab work for his anemia workup.

## 2022-11-14 NOTE — Assessment & Plan Note (Signed)
-  His anemia is most likely related to his chronic disease, especially diabetes and complications. -He does have low serum iron, low TIBC, ferritin has been normal in most tests, he probably has component of iron deficiency also, but his iron study is most consistent with anemia of chronic disease. -I will continue his oral iron, we discussed take his iron pill in between Pepcid, and avoid PPI.  I encouraged him to take vitamin C along with iron pill. -His anemia has improved significantly since last year, I do not feel strongly he needs IV iron at this point.  Continue to monitor his lab

## 2022-11-18 NOTE — Telephone Encounter (Signed)
Pt has active CAFA -- [place] ----  09/21/22 - 03/23/23 and his [place] is active until 01/22/2023. As a reminder, CAFA is applicable to certain eligible services throughout the 110-month eligibility period and can be retroactive. The [place] does pay for PCP visits. [place] helps patients get access to a PCP so that they can be referred to specialists within the [employer] of providers. Copays are due and may vary based on the service and patient's FPL.

## 2022-11-30 ENCOUNTER — Telehealth: Payer: Self-pay

## 2022-11-30 NOTE — Telephone Encounter (Signed)
I received an email from [employer] with a bill for supplies and I also received an email informing me that the supplies are ready for delivery.  I spoke to [employer] regarding the supplies and informed her that my email should be removed from the account and any information about the supplies/ billing should be directed to the patient and his family. The family needs to be notified that the supplies will be delivered.   I then spoke to [facility] billing and also informed her that my address needs to be removed from the account and she said it has been.  I told her that the bill for supplies should be sent to the patient and his family and she said they would do that.  She also noted that the family has made payments for supplies since [facility] stopped paying for the supplies.

## 2022-11-30 NOTE — Progress Notes (Unsigned)
   Established Patient Office Visit  Subjective   Patient ID: John Meza, male    DOB: 24-Oct-1960  Age: 62 y.o. MRN: [id number]  No chief complaint on file.   62 y.o.M last seen by Joya Gaskins 07/2022 Saw [name] 10/2022  1. Insulin dependent type 2 diabetes mellitus, controlled (HCC) No change to regimen at this time(will make changes if needed once labs are back - Glucose (CBG) - CBC with Differential/Platelet - Hemoglobin A1c  2. Iron deficiency anemia due to chronic blood loss - Iron, TIBC and Ferritin Panel - ferrous sulfate 325 (65 FE) MG tablet; Take 1 tablet (325 mg total) by mouth daily.  Dispense: 30 tablet; Refill: 4  3. Essential hypertension At goal - CBC with Differential/Platelet  4. Vitamin D deficiency - Vitamin D, 25-hydroxy    {History (Optional):23778}  ROS    Objective:     There were no vitals taken for this visit. {Vitals History (Optional):23777}  Physical Exam   No results found for any visits on 12/01/22.  {Labs (Optional):23779}  The ASCVD Risk score (Arnett DK, et al., 2019) failed to calculate for the following reasons:   The valid total cholesterol range is 130 to 320 mg/dL    Assessment & Plan:   Problem List Items Addressed This Visit   None   No follow-ups on file.    Asencion Noble, MD

## 2022-12-01 ENCOUNTER — Other Ambulatory Visit: Payer: Self-pay

## 2022-12-01 ENCOUNTER — Ambulatory Visit: Payer: Self-pay | Attending: Critical Care Medicine | Admitting: Critical Care Medicine

## 2022-12-01 ENCOUNTER — Encounter: Payer: Self-pay | Admitting: Critical Care Medicine

## 2022-12-01 VITALS — BP 125/78 | HR 81

## 2022-12-01 DIAGNOSIS — I10 Essential (primary) hypertension: Secondary | ICD-10-CM

## 2022-12-01 DIAGNOSIS — E559 Vitamin D deficiency, unspecified: Secondary | ICD-10-CM

## 2022-12-01 DIAGNOSIS — K029 Dental caries, unspecified: Secondary | ICD-10-CM

## 2022-12-01 DIAGNOSIS — E1165 Type 2 diabetes mellitus with hyperglycemia: Secondary | ICD-10-CM

## 2022-12-01 DIAGNOSIS — E119 Type 2 diabetes mellitus without complications: Secondary | ICD-10-CM

## 2022-12-01 DIAGNOSIS — E1169 Type 2 diabetes mellitus with other specified complication: Secondary | ICD-10-CM | POA: Insufficient documentation

## 2022-12-01 DIAGNOSIS — D500 Iron deficiency anemia secondary to blood loss (chronic): Secondary | ICD-10-CM

## 2022-12-01 DIAGNOSIS — E782 Mixed hyperlipidemia: Secondary | ICD-10-CM

## 2022-12-01 DIAGNOSIS — L89310 Pressure ulcer of right buttock, unstageable: Secondary | ICD-10-CM

## 2022-12-01 DIAGNOSIS — Z794 Long term (current) use of insulin: Secondary | ICD-10-CM

## 2022-12-01 DIAGNOSIS — G8220 Paraplegia, unspecified: Secondary | ICD-10-CM

## 2022-12-01 DIAGNOSIS — E785 Hyperlipidemia, unspecified: Secondary | ICD-10-CM

## 2022-12-01 LAB — GLUCOSE, POCT (MANUAL RESULT ENTRY): POC Glucose: 101 mg/dl — AB (ref 70–99)

## 2022-12-01 MED ORDER — SITAGLIPTIN PHOSPHATE 100 MG PO TABS
100.0000 mg | ORAL_TABLET | Freq: Every day | ORAL | 3 refills | Status: DC
  Filled 2022-12-01: qty 30, 30d supply, fill #0

## 2022-12-01 MED ORDER — FERROUS SULFATE 325 (65 FE) MG PO TABS
325.0000 mg | ORAL_TABLET | Freq: Every day | ORAL | 4 refills | Status: DC
  Filled 2022-12-01: qty 30, 30d supply, fill #0
  Filled 2023-01-04: qty 30, 30d supply, fill #1
  Filled 2023-02-03: qty 30, 30d supply, fill #2
  Filled 2023-03-03: qty 30, 30d supply, fill #3
  Filled 2023-04-01: qty 30, 30d supply, fill #4

## 2022-12-01 MED ORDER — FAMOTIDINE 20 MG PO TABS
20.0000 mg | ORAL_TABLET | Freq: Two times a day (BID) | ORAL | 6 refills | Status: DC
  Filled 2022-12-01: qty 60, 30d supply, fill #0
  Filled 2023-01-19: qty 60, 30d supply, fill #1
  Filled 2023-03-03: qty 60, 30d supply, fill #2
  Filled 2023-04-23: qty 60, 30d supply, fill #3
  Filled 2023-05-19: qty 180, 90d supply, fill #4

## 2022-12-01 MED ORDER — METFORMIN HCL 1000 MG PO TABS
1000.0000 mg | ORAL_TABLET | Freq: Two times a day (BID) | ORAL | 3 refills | Status: DC
  Filled 2022-12-01 – 2023-01-19 (×3): qty 180, 90d supply, fill #0
  Filled 2023-01-19: qty 108, 54d supply, fill #0
  Filled 2023-01-19: qty 72, 36d supply, fill #0
  Filled 2023-04-23: qty 180, 90d supply, fill #1
  Filled 2023-07-30: qty 180, 90d supply, fill #2
  Filled 2023-11-02 (×2): qty 180, 90d supply, fill #3

## 2022-12-01 MED ORDER — VALSARTAN-HYDROCHLOROTHIAZIDE 320-25 MG PO TABS
1.0000 | ORAL_TABLET | Freq: Every day | ORAL | 3 refills | Status: DC
  Filled 2022-12-01: qty 30, 30d supply, fill #0
  Filled 2023-01-04: qty 30, 30d supply, fill #1
  Filled 2023-02-03: qty 30, 30d supply, fill #2
  Filled 2023-03-03: qty 30, 30d supply, fill #3
  Filled 2023-04-01: qty 30, 30d supply, fill #4
  Filled 2023-05-19 (×2): qty 90, 90d supply, fill #5
  Filled 2023-08-04: qty 90, 90d supply, fill #6
  Filled 2023-11-02 (×2): qty 30, 30d supply, fill #7

## 2022-12-01 MED ORDER — VITAMIN D (ERGOCALCIFEROL) 1.25 MG (50000 UNIT) PO CAPS
50000.0000 [IU] | ORAL_CAPSULE | ORAL | 1 refills | Status: DC
  Filled 2022-12-01: qty 5, 35d supply, fill #0
  Filled 2023-01-19: qty 5, 35d supply, fill #1

## 2022-12-01 MED ORDER — ATORVASTATIN CALCIUM 20 MG PO TABS
20.0000 mg | ORAL_TABLET | Freq: Every day | ORAL | 2 refills | Status: DC
  Filled 2022-12-01: qty 90, 90d supply, fill #0
  Filled 2023-03-03: qty 90, 90d supply, fill #1
  Filled 2023-06-21 (×3): qty 90, 90d supply, fill #2

## 2022-12-01 NOTE — Assessment & Plan Note (Signed)
Diabetes controlled off insulin continue oral therapy of metformin and Sitagliptin

## 2022-12-01 NOTE — Patient Instructions (Addendum)
Referral to dentist will be made  Refills on medications sent to our pharmacy  No further insulin is needed  No change in other medications  Return to see Dr. Joya Gaskins 4 months  Laboratory data will be obtained at this visit cholesterol panel and metabolic panel  Se har derivacin al dentista.  Recargas de medicamentos enviados a nuestra farmacia  No se necesita ms insulina  Sin cambios en otros medicamentos.  Volver a ver al Dr. Joya Gaskins 4 meses  En esta visita se obtendrn datos de laboratorio, panel de colesterol y panel metablico.

## 2022-12-01 NOTE — Assessment & Plan Note (Signed)
This is persisting

## 2022-12-01 NOTE — Assessment & Plan Note (Signed)
Hypertension well controlled no change in medication 

## 2022-12-01 NOTE — Assessment & Plan Note (Signed)
Controlled type 2 diabetes continue current therapy

## 2022-12-01 NOTE — Assessment & Plan Note (Signed)
Pain and jaw right lower molar has dental caries referral to adult dental clinic made

## 2022-12-01 NOTE — Assessment & Plan Note (Signed)
Assess lipid panel continue with statin

## 2022-12-01 NOTE — Assessment & Plan Note (Signed)
This has resolved.

## 2022-12-02 ENCOUNTER — Telehealth (HOSPITAL_COMMUNITY): Payer: Self-pay

## 2022-12-02 NOTE — Telephone Encounter (Signed)
Spoke to Mr. Sobin daughter and discussed Colonel's graduation from paramedicine program. She was understanding and states she will translate this to her father and mother. They are aware if they have any needs arise to reach out to [facility] and or they can contact me if needed and I will get them the correct resources.   Paramedicine Discharge Date 12/02/22   Salena Saner, [place] 12/02/2022

## 2022-12-03 LAB — COMPREHENSIVE METABOLIC PANEL

## 2022-12-03 LAB — LIPID PANEL

## 2022-12-03 NOTE — Progress Notes (Signed)
Let pt know unfortunately labs could not be processed due to insufficient blood drawn  He can come back anytime at no charge for lab redraw

## 2022-12-09 ENCOUNTER — Other Ambulatory Visit: Payer: Self-pay

## 2023-01-01 ENCOUNTER — Other Ambulatory Visit: Payer: Self-pay

## 2023-01-04 ENCOUNTER — Other Ambulatory Visit: Payer: Self-pay

## 2023-01-05 ENCOUNTER — Other Ambulatory Visit: Payer: Self-pay

## 2023-01-13 ENCOUNTER — Inpatient Hospital Stay: Payer: Self-pay | Attending: Hematology

## 2023-01-13 ENCOUNTER — Other Ambulatory Visit: Payer: Self-pay

## 2023-01-13 DIAGNOSIS — D638 Anemia in other chronic diseases classified elsewhere: Secondary | ICD-10-CM

## 2023-01-13 DIAGNOSIS — D649 Anemia, unspecified: Secondary | ICD-10-CM

## 2023-01-13 DIAGNOSIS — D509 Iron deficiency anemia, unspecified: Secondary | ICD-10-CM

## 2023-01-13 LAB — RETIC PANEL
Immature Retic Fract: 17.4 % — ABNORMAL HIGH (ref 2.3–15.9)
RBC.: 3.84 MIL/uL — ABNORMAL LOW (ref 4.22–5.81)
Retic Count, Absolute: 108.7 10*3/uL (ref 19.0–186.0)
Retic Ct Pct: 2.8 % (ref 0.4–3.1)
Reticulocyte Hemoglobin: 30.3 pg (ref >27.9)

## 2023-01-13 LAB — CBC WITH DIFFERENTIAL/PLATELET
Abs Immature Granulocytes: 0.02 10*3/uL (ref 0.00–0.07)
Basophils Absolute: 0 10*3/uL (ref 0.0–0.1)
Basophils Relative: 1 %
Eosinophils Absolute: 0.1 10*3/uL (ref 0.0–0.5)
Eosinophils Relative: 2 %
HCT: 32.1 % — ABNORMAL LOW (ref 39.0–52.0)
Hemoglobin: 10.7 g/dL — ABNORMAL LOW (ref 13.0–17.0)
Immature Granulocytes: 0 %
Lymphocytes Relative: 26 %
Lymphs Abs: 1.8 10*3/uL (ref 0.7–4.0)
MCH: 27.9 pg (ref 26.0–34.0)
MCHC: 33.3 g/dL (ref 30.0–36.0)
MCV: 83.6 fL (ref 80.0–100.0)
Monocytes Absolute: 0.6 10*3/uL (ref 0.1–1.0)
Monocytes Relative: 9 %
Neutro Abs: 4.2 10*3/uL (ref 1.7–7.7)
Neutrophils Relative %: 62 %
Platelets: 264 10*3/uL (ref 150–400)
RBC: 3.84 MIL/uL — ABNORMAL LOW (ref 4.22–5.81)
RDW: 15.5 % (ref 11.5–15.5)
WBC: 6.8 10*3/uL (ref 4.0–10.5)
nRBC: 0 % (ref 0.0–0.2)

## 2023-01-13 LAB — FERRITIN: Ferritin: 26 ng/mL (ref 24–336)

## 2023-01-13 LAB — IRON AND IRON BINDING CAPACITY (CC-WL,HP ONLY)
Iron: 36 ug/dL — ABNORMAL LOW (ref 45–182)
Saturation Ratios: 15 % — ABNORMAL LOW (ref 17.9–39.5)
TIBC: 246 ug/dL — ABNORMAL LOW (ref 250–450)
UIBC: 210 ug/dL (ref 117–376)

## 2023-01-13 LAB — VITAMIN B12: Vitamin B-12: 439 pg/mL (ref 180–914)

## 2023-01-19 ENCOUNTER — Other Ambulatory Visit: Payer: Self-pay

## 2023-01-20 ENCOUNTER — Telehealth: Payer: Self-pay

## 2023-01-20 NOTE — Telephone Encounter (Addendum)
Contacted the interpreter to relay the message below as per Dr. Mosetta Putt. Patients wife voiced full understanding.   ----- Message from Malachy Mood, MD sent at 01/15/2023 10:34 PM EDT ----- Please let pt know his anemia has improved, let him continue oral iron and f/u as scheduled, thanks   Malachy Mood  01/15/2023

## 2023-01-27 ENCOUNTER — Ambulatory Visit: Payer: Self-pay | Attending: Critical Care Medicine | Admitting: Critical Care Medicine

## 2023-01-27 ENCOUNTER — Other Ambulatory Visit: Payer: Self-pay

## 2023-01-27 ENCOUNTER — Encounter: Payer: Self-pay | Admitting: Critical Care Medicine

## 2023-01-27 VITALS — BP 125/74 | HR 78 | Temp 97.9°F | Ht 64.0 in

## 2023-01-27 DIAGNOSIS — D509 Iron deficiency anemia, unspecified: Secondary | ICD-10-CM

## 2023-01-27 DIAGNOSIS — I10 Essential (primary) hypertension: Secondary | ICD-10-CM

## 2023-01-27 DIAGNOSIS — K029 Dental caries, unspecified: Secondary | ICD-10-CM

## 2023-01-27 DIAGNOSIS — E1165 Type 2 diabetes mellitus with hyperglycemia: Secondary | ICD-10-CM

## 2023-01-27 DIAGNOSIS — E785 Hyperlipidemia, unspecified: Secondary | ICD-10-CM

## 2023-01-27 DIAGNOSIS — Z23 Encounter for immunization: Secondary | ICD-10-CM

## 2023-01-27 DIAGNOSIS — E1169 Type 2 diabetes mellitus with other specified complication: Secondary | ICD-10-CM

## 2023-01-27 DIAGNOSIS — N319 Neuromuscular dysfunction of bladder, unspecified: Secondary | ICD-10-CM

## 2023-01-27 NOTE — Patient Instructions (Addendum)
[facility] ph. # [phone] [id number]  you can call to see when appointment will occur  No change in medications  Go to lab today  Second shingles shot given  Return Dr [name] 4 mo  [facility] # [phone] puedes llamar para ver cuando se realizar la cita  Sin cambios en los medicamentos.  Ir al laboratorio hoy  Se administra segunda vacuna contra la culebrilla  Regresar Dr. [name] 4 meses

## 2023-01-27 NOTE — Progress Notes (Signed)
Established Patient Office Visit  Subjective   Patient ID: John Meza, male    DOB: [birth year]  Age: 62 y.o. MRN: [id number]  Chief Complaint  Patient presents with   Diabetes    DM f/u.  Yes to shingles vax.     12/01/22 61 y.o.M last seen by [name] 07/2022 Saw [name] 10/2022 At the visit January 17 patient had slight increase in A1c to 6.5 and insulin dose was increased.  Patient maintained iron supplement.  He is followed by hematology who is considering iron infusion.  The patient then had low blood sugars and was seen by [name] nurse practitioner at the [facility] primary care office Documentation is as below Saw [place]fields end of Jan PCE [place] Patient's last appointment 10/28/2022 at [facility] with [name], [place]. During that visit patient was seen for diabetes management. His hemoglobin A1c resulted 6.5% which was increased from previous 5.8%. Lab results review indicates that [name], [place] instructed patient to increase daily insulin from 25 units daily to 28 units daily. Today patient states that he was having low blood sugars before the insulin dose was increased but he forgot to tell [place], [place] during his appointment with her. Patient reports he self-discontinued insulin around 7 days ago. Since then blood sugars are 80's while fasting. He states he feels back to normal since no longer having low blood sugars. He is taking Metformin and Jardiance as prescribed for diabetes management. Patient and patient's wife confirmed they do not having any questions regarding blood pressure medication and no further issues/concerns for discussion on today.   1. Insulin dependent type 2 diabetes mellitus, controlled (HCC) 2. Hypoglycemia - Patient's last appointment was on 10/28/2022 at [facility] with [name], [place]. During that visit patient was seen for diabetes management. His hemoglobin A1c resulted  6.5% which was increased from previous 5.8%. Lab results review indicates that [name], [place] instructed patient to increase daily Insulin Glargine from 25 units daily to 28 units daily.  - Today patient reports he self-discontinued Insulin Glargine 7 days ago and and fasting blood sugars are now in the 80's with no side effects.  - Today patient instructed to continue to hold Insulin Glargine for now. - Continue Metformin and Sitagliptin as prescribed. No refills needed as of present.  - Discussed the importance of healthy eating habits, low-carbohydrate diet, low-sugar diet, regular aerobic exercise (at least 150 minutes a week as tolerated) and medication compliance to achieve or maintain control of diabetes. - Follow-up with primary provider [name], MD in 2 weeks or sooner if needed.   Patient had wounds on both ankles these have resolved patient had wound on buttocks this has resolved  01/27/23 Patient seen in return follow-up overall is stable and doing well visit assisted by [name] [phone] video Spanish interpreter.  Patient's wife accompanies him.  All of his wounds of healed.  He did get an eye exam at [facility] will need a report from this.  He needs to have metabolic panel and lipids rechecked.  Blood sugars are markedly improved on his diary.  There are no other complaints.  Blood pressure on arrival on recheck is 125/74      Review of Systems  Constitutional:  Negative for chills, diaphoresis, fever, malaise/fatigue and weight loss.  HENT:  Negative for congestion, hearing loss, nosebleeds, sore throat and tinnitus.   Eyes:  Negative for blurred vision, photophobia and redness.  Respiratory:  Negative for cough, hemoptysis, sputum production, shortness of breath, wheezing and stridor.   Cardiovascular:  Negative for chest pain, palpitations, orthopnea, claudication, leg swelling and PND.  Gastrointestinal:  Negative for abdominal pain, blood in stool, constipation, diarrhea,  heartburn, nausea and vomiting.  Genitourinary:  Negative for dysuria, flank pain, frequency, hematuria and urgency.  Musculoskeletal:  Negative for back pain, falls, joint pain, myalgias and neck pain.  Skin:  Negative for itching and rash.  Neurological:  Negative for dizziness, tingling, tremors, sensory change, speech change, focal weakness, seizures, loss of consciousness, weakness and headaches.  Endo/Heme/Allergies:  Negative for environmental allergies and polydipsia. Does not bruise/bleed easily.  Psychiatric/Behavioral:  Negative for depression, memory loss, substance abuse and suicidal ideas. The patient is not nervous/anxious and does not have insomnia.       Objective:     BP 125/74   Pulse 78   Temp 97.9 F (36.6 C) (Oral)   Ht  (1.626 m)   SpO2 100%   BMI 32.92 kg/m    Physical Exam Vitals reviewed.  Constitutional:      Appearance: Normal appearance. He is well-developed. He is not diaphoretic.  HENT:     Head: Normocephalic and atraumatic.     Nose: No nasal deformity, septal deviation, mucosal edema or rhinorrhea.     Right Sinus: No maxillary sinus tenderness or frontal sinus tenderness.     Left Sinus: No maxillary sinus tenderness or frontal sinus tenderness.     Mouth/Throat:     Pharynx: No oropharyngeal exudate.     Comments: Poor dentition dental carry right lower molar Eyes:     General: No scleral icterus.    Conjunctiva/sclera: Conjunctivae normal.     Pupils: Pupils are equal, round, and reactive to light.  Neck:     Thyroid: No thyromegaly.     Vascular: No carotid bruit or JVD.     Trachea: Trachea normal. No tracheal tenderness or tracheal deviation.  Cardiovascular:     Rate and Rhythm: Normal rate and regular rhythm.     Chest Wall: PMI is not displaced.     Pulses: Normal pulses. No decreased pulses.     Heart sounds: Normal heart sounds, S1 normal and S2 normal. Heart sounds not distant. No murmur heard.    No systolic murmur is  present.     No diastolic murmur is present.     No friction rub. No gallop. No S3 or S4 sounds.  Pulmonary:     Effort: No tachypnea, accessory muscle usage or respiratory distress.     Breath sounds: No stridor. No decreased breath sounds, wheezing, rhonchi or rales.  Chest:     Chest wall: No tenderness.  Abdominal:     General: Bowel sounds are normal. There is no distension.     Palpations: Abdomen is soft. Abdomen is not rigid.     Tenderness: There is no abdominal tenderness. There is no guarding or rebound.  Musculoskeletal:        General: Normal range of motion.     Cervical back: Normal range of motion and neck supple. No edema, erythema or rigidity. No muscular tenderness. Normal range of motion.  Lymphadenopathy:     Head:     Right side of head: No submental or submandibular adenopathy.     Left side of head: No submental or submandibular adenopathy.     Cervical: No cervical adenopathy.  Skin:    General: Skin is warm and  dry.     Coloration: Skin is not pale.     Findings: No rash.     Nails: There is no clubbing.  Neurological:     Mental Status: He is alert and oriented to person, place, and time. Mental status is at baseline.     Sensory: No sensory deficit.     Motor: Weakness present.     Gait: Gait abnormal.  Psychiatric:        Speech: Speech normal.        Behavior: Behavior normal.      No results found for any visits on 01/27/23.     The ASCVD Risk score (Arnett DK, et al., 2019) failed to calculate for the following reasons:   The valid HDL cholesterol range is 0.517 to 2.586 mmol/L   The valid total cholesterol range is 130 to 320 mg/dL    Assessment & Plan:   Problem List Items Addressed This Visit       Cardiovascular and Mediastinum   Essential hypertension - Primary    Blood pressure at goal no change in medication      Relevant Orders   Comprehensive metabolic panel     Digestive   Dental caries    Patient waiting for an  appointment at [facility]        Endocrine   Controlled type 2 diabetes mellitus    Improved control of type 2 diabetes      Relevant Orders   Comprehensive metabolic panel   Hyperlipidemia associated with type 2 diabetes mellitus    Continue statin      Relevant Orders   Lipid panel     Other   Neurogenic bladder    Patient paying for In-N-Out catheterization for now      Iron deficiency anemia, unspecified    Sees hematology no infusion of iron indicated      Return in about 4 months (around 05/29/2023) for diabetes, htn.    [name], MD

## 2023-01-27 NOTE — Assessment & Plan Note (Signed)
Patient waiting for an appointment at [facility] adult dental

## 2023-01-27 NOTE — Assessment & Plan Note (Signed)
Continue statin. 

## 2023-01-27 NOTE — Assessment & Plan Note (Signed)
Patient paying for In-N-Out catheterization for now

## 2023-01-27 NOTE — Assessment & Plan Note (Signed)
Sees hematology no infusion of iron indicated

## 2023-01-27 NOTE — Assessment & Plan Note (Signed)
Improved control of type 2 diabetes

## 2023-01-27 NOTE — Assessment & Plan Note (Signed)
Blood pressure at goal no change in medication 

## 2023-01-28 LAB — LIPID PANEL
Chol/HDL Ratio: 2.7 ratio (ref 0.0–5.0)
Cholesterol, Total: 117 mg/dL (ref 100–199)
HDL: 43 mg/dL (ref >39)
LDL Chol Calc (NIH): 56 mg/dL (ref 0–99)
Triglycerides: 97 mg/dL (ref 0–149)
VLDL Cholesterol Cal: 18 mg/dL (ref 5–40)

## 2023-01-28 LAB — COMPREHENSIVE METABOLIC PANEL
ALT: 11 IU/L (ref 0–44)
AST: 10 IU/L (ref 0–40)
Albumin/Globulin Ratio: 1.2 (ref 1.2–2.2)
Albumin: 3.9 g/dL (ref 3.8–4.9)
Alkaline Phosphatase: 149 IU/L — ABNORMAL HIGH (ref 44–121)
BUN/Creatinine Ratio: 28 — ABNORMAL HIGH (ref 9–20)
BUN: 22 mg/dL (ref 6–24)
Bilirubin Total: <0.2 mg/dL (ref 0.0–1.2)
CO2: 20 mmol/L (ref 20–29)
Calcium: 9.3 mg/dL (ref 8.7–10.2)
Chloride: 97 mmol/L (ref 96–106)
Creatinine, Ser: 0.8 mg/dL (ref 0.76–1.27)
Globulin, Total: 3.2 g/dL (ref 1.5–4.5)
Glucose: 96 mg/dL (ref 70–99)
Potassium: 4.7 mmol/L (ref 3.5–5.2)
Sodium: 134 mmol/L (ref 134–144)
Total Protein: 7.1 g/dL (ref 6.0–8.5)
eGFR: 102 mL/min/{1.73_m2} (ref >59)

## 2023-01-28 NOTE — Progress Notes (Signed)
Let patient know kidneys and liver are normal and cholesterol is at goal

## 2023-02-02 ENCOUNTER — Telehealth: Payer: Self-pay

## 2023-02-02 NOTE — Telephone Encounter (Signed)
-----   Message from Storm Frisk, MD sent at 01/28/2023  6:11 AM EDT ----- Let patient know kidneys and liver are normal and cholesterol is at goal

## 2023-02-02 NOTE — Telephone Encounter (Signed)
Pt was called and vm was left, Information has been sent to nurse pool.   Interpreter id # [phone]

## 2023-02-03 ENCOUNTER — Other Ambulatory Visit: Payer: Self-pay

## 2023-03-03 ENCOUNTER — Other Ambulatory Visit: Payer: Self-pay

## 2023-03-03 ENCOUNTER — Other Ambulatory Visit: Payer: Self-pay | Admitting: Critical Care Medicine

## 2023-03-03 DIAGNOSIS — E559 Vitamin D deficiency, unspecified: Secondary | ICD-10-CM

## 2023-03-03 MED ORDER — VITAMIN D (ERGOCALCIFEROL) 1.25 MG (50000 UNIT) PO CAPS
50000.0000 [IU] | ORAL_CAPSULE | ORAL | 0 refills | Status: DC
Start: 2023-03-03 — End: 2023-12-01
  Filled 2023-03-03: qty 4, 28d supply, fill #0

## 2023-03-03 NOTE — Telephone Encounter (Signed)
Requested medication (s) are due for refill today: Yes  Requested medication (s) are on the active medication list: Yes  Last refill:  12/01/22  Future visit scheduled: Yes  Notes to clinic:  See request.    Requested Prescriptions  Pending Prescriptions Disp Refills   Vitamin D, Ergocalciferol, (DRISDOL) 1.25 MG (50000 UNIT) CAPS capsule 5 capsule 1    Sig: Take 1 capsule (50,000 Units total) by mouth every 7 (seven) days.     Endocrinology:  Vitamins - Vitamin D Supplementation 2 Failed - 03/03/2023  2:16 PM      Failed - Manual Review: Route requests for 50,000 IU strength to the provider      Failed - Vitamin D in normal range and within 360 days    Vit D, 25-Hydroxy  Date Value Ref Range Status  10/28/2022 24.0 (L) 30.0 - 100.0 ng/mL Final    Comment:    Vitamin D deficiency has been defined by the Institute of Medicine and an Endocrine Society practice guideline as a level of serum 25-OH vitamin D less than 20 ng/mL (1,2). The Endocrine Society went on to further define vitamin D insufficiency as a level between 21 and 29 ng/mL (2). 1. IOM (Institute of Medicine). 2010. Dietary reference    intakes for calcium and D. [place]: The    [employer]. 2. Holick MF, Binkley [place], Bischoff-Ferrari HA, et al.    Evaluation, treatment, and prevention of vitamin D    deficiency: an Endocrine Society clinical practice    guideline. JCEM. 2011 Jul; 96(7):1911-30.          Passed - Ca in normal range and within 360 days    Calcium  Date Value Ref Range Status  01/27/2023 9.3 8.7 - 10.2 mg/dL Final   Calcium, Ion  Date Value Ref Range Status  04/08/2012 1.27 1.12 - 1.32 mmol/L Final         Passed - Valid encounter within last 12 months    Recent Outpatient Visits           1 month ago Controlled type 2 diabetes mellitus with hyperglycemia, without long-term current use of insulin [facility])   [place] [facility] Storm Frisk,  MD   3 months ago Controlled type 2 diabetes mellitus with hyperglycemia, without long-term current use of insulin [facility])   [place] [facility] Storm Frisk, MD   3 months ago Insulin dependent type 2 diabetes mellitus, controlled [facility])   [place], Amy J, NP   4 months ago Insulin dependent type 2 diabetes mellitus, controlled [facility])   [place] [facility] [place], [place], [place]   7 months ago Insulin dependent type 2 diabetes mellitus, controlled [facility])   [place] Storm Frisk, MD       Future Appointments             In 3 months [name] Charlcie Cradle, MD [facility]

## 2023-03-04 ENCOUNTER — Other Ambulatory Visit: Payer: Self-pay

## 2023-03-09 ENCOUNTER — Other Ambulatory Visit: Payer: Self-pay

## 2023-03-16 ENCOUNTER — Telehealth: Payer: Self-pay | Admitting: Critical Care Medicine

## 2023-03-16 NOTE — Telephone Encounter (Signed)
Pt wife [name] is calling to check on the status of the [facility] Card for the pt. Per [name] she submitted the application one month ago. [name] states that her husband has an appt tomorrow and need to know if he will have the [employer]. Please advise.

## 2023-03-16 NOTE — Assessment & Plan Note (Signed)
-  His anemia is most likely related to his chronic disease, especially diabetes and complications. -He does have low serum iron, low TIBC, ferritin has been normal in most tests, he probably has component of iron deficiency also, but his iron study is most consistent with anemia of chronic disease. -I will continue his oral iron, we discussed take his iron pill in between Pepcid, and avoid PPI.  I encouraged him to take vitamin C along with iron pill. -His anemia has improved significantly since last year, I do not feel strongly he needs IV iron at this point.  Continue to monitor his lab 

## 2023-03-16 NOTE — Assessment & Plan Note (Signed)
-  His iron study supports anemia of chronic disease, most likely related to his infection in June 2023, and long-term diabetes. -Anemia has improved with oral iron and medical management of his chronic disease. 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  However if his anemia does not improve further, I would add an additional lab work for his anemia workup. 

## 2023-03-17 ENCOUNTER — Inpatient Hospital Stay: Payer: Self-pay | Attending: Hematology

## 2023-03-17 ENCOUNTER — Inpatient Hospital Stay (HOSPITAL_BASED_OUTPATIENT_CLINIC_OR_DEPARTMENT_OTHER): Payer: Self-pay | Admitting: Hematology

## 2023-03-17 ENCOUNTER — Encounter: Payer: Self-pay | Admitting: Hematology

## 2023-03-17 VITALS — BP 144/74 | HR 77 | Temp 97.6°F | Resp 18

## 2023-03-17 DIAGNOSIS — E119 Type 2 diabetes mellitus without complications: Secondary | ICD-10-CM | POA: Insufficient documentation

## 2023-03-17 DIAGNOSIS — G8220 Paraplegia, unspecified: Secondary | ICD-10-CM | POA: Insufficient documentation

## 2023-03-17 DIAGNOSIS — D509 Iron deficiency anemia, unspecified: Secondary | ICD-10-CM

## 2023-03-17 DIAGNOSIS — D638 Anemia in other chronic diseases classified elsewhere: Secondary | ICD-10-CM

## 2023-03-17 DIAGNOSIS — D649 Anemia, unspecified: Secondary | ICD-10-CM

## 2023-03-17 LAB — CBC WITH DIFFERENTIAL/PLATELET
Abs Immature Granulocytes: 0.02 10*3/uL (ref 0.00–0.07)
Basophils Absolute: 0 10*3/uL (ref 0.0–0.1)
Basophils Relative: 1 %
Eosinophils Absolute: 0.1 10*3/uL (ref 0.0–0.5)
Eosinophils Relative: 1 %
HCT: 33.3 % — ABNORMAL LOW (ref 39.0–52.0)
Hemoglobin: 10.8 g/dL — ABNORMAL LOW (ref 13.0–17.0)
Immature Granulocytes: 0 %
Lymphocytes Relative: 30 %
Lymphs Abs: 2.1 10*3/uL (ref 0.7–4.0)
MCH: 28.1 pg (ref 26.0–34.0)
MCHC: 32.4 g/dL (ref 30.0–36.0)
MCV: 86.5 fL (ref 80.0–100.0)
Monocytes Absolute: 0.5 10*3/uL (ref 0.1–1.0)
Monocytes Relative: 7 %
Neutro Abs: 4.4 10*3/uL (ref 1.7–7.7)
Neutrophils Relative %: 61 %
Platelets: 257 10*3/uL (ref 150–400)
RBC: 3.85 MIL/uL — ABNORMAL LOW (ref 4.22–5.81)
RDW: 15.5 % (ref 11.5–15.5)
WBC: 7.2 10*3/uL (ref 4.0–10.5)
nRBC: 0 % (ref 0.0–0.2)

## 2023-03-17 LAB — FERRITIN: Ferritin: 21 ng/mL — ABNORMAL LOW (ref 24–336)

## 2023-03-17 LAB — IRON AND IRON BINDING CAPACITY (CC-WL,HP ONLY)
Iron: 50 ug/dL (ref 45–182)
Saturation Ratios: 20 % (ref 17.9–39.5)
TIBC: 255 ug/dL (ref 250–450)
UIBC: 205 ug/dL (ref 117–376)

## 2023-03-17 NOTE — Progress Notes (Signed)
[facility]   [phone] [phone]   Clinic Follow up Note   Patient Care Team: Storm Frisk, MD as PCP - General (Pulmonary Disease) Malachy Mood, MD as Consulting Physician (Hematology)  Date of Service:  03/17/2023  CHIEF COMPLAINT: f/u of Anemia   CURRENT THERAPY:  Oral Iron Supplement , Iv Iron as needed  ASSESSMENT:  John Meza is a 62 y.o. male with   Iron deficiency anemia, unspecified -His anemia is most likely related to his chronic disease, especially diabetes and complications. -He does have low serum iron, low TIBC, ferritin has been normal in most tests, he probably has component of iron deficiency also, but his iron study is most consistent with anemia of chronic disease. -I will continue his oral iron, we discussed take his iron pill in between Pepcid, and avoid PPI.  I encouraged him to take vitamin C along with iron pill. -His anemia has improved significantly since last year, I do not feel strongly he needs IV iron at this point.  Continue to monitor his lab  Anemia of chronic disease -His iron study supports anemia of chronic disease, most likely related to his infection in June 2023, and long-term diabetes. -Anemia has improved with oral iron and medical management of his chronic disease. 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  However if his anemia does not improve further, I would add an additional lab work for his anemia workup.     PLAN: -lab reviewed hg 10.8 -Ferritin -pending. If I ron level low may consider giving IV Iron - I recommend the pt not to take Iron supplement and Pecid at the same time. -I recommend pt to take an OTC vitamin C with his Iron Supplement due  to pt being diabetic  -lab every 3 months with a f/u in 6 months   INTERVAL HISTORY:  John Meza is here for a follow up of Anemia. He was last seen by me on 11/14/2022. He presents to the clinic accompanied by daughter. Pt state that he is doing good and his energy level is good. Pt state the he is overall doing well. Pt take is Iron supplement before he eats.Pt stat that sometimes it gives him acid reflux.    All other systems were reviewed with the patient and are negative.  MEDICAL HISTORY:  Past Medical History:  Diagnosis Date   Cellulitis and abscess of buttock 10/2016   Diabetes mellitus    ESBL (extended spectrum beta-lactamase) producing bacteria infection 03/16/2022   Hyperkalemia 03/16/2022   Hypertension    Myositis 04/21/2022   Paraplegia (HCC) 2013   fell from ladder   Pressure ulcer, buttock, right, unstageable (HCC) 12/30/2020   Pyelonephritis due to Escherichia coli 03/16/2022   Sacral decubitus ulcer, stage IV (HCC) 11/03/2021   SCI (spinal cord injury)    Sepsis due to Escherichia coli (E. coli) (HCC) 03/01/2022   Spine fracture 11/16/2011   T 11- T9-L1    SURGICAL HISTORY: Past Surgical History:  Procedure Laterality Date   CHOLECYSTECTOMY N/A 05/01/2015   Procedure: LAPAROSCOPIC CHOLECYSTECTOMY WITH ATTEMPTED INTRAOPERATIVE CHOLANGIOGRAM;  Surgeon: Violeta Gelinas, MD;  Location: [facility] OR;  Service: General;  Laterality: N/A;   SPINE SURGERY     TEE WITHOUT CARDIOVERSION N/A 03/26/2022   Procedure: TRANSESOPHAGEAL ECHOCARDIOGRAM (TEE);  Surgeon: [name], MD;  Location: [facility] ENDOSCOPY;  Service: Cardiovascular;  Laterality: N/A;    I have reviewed the social history and family history with the patient and they are unchanged from previous note.  ALLERGIES:  is allergic to macrobid [nitrofurantoin].  MEDICATIONS:  Current Outpatient Medications  Medication Sig Dispense Refill   atorvastatin (LIPITOR) 20 MG tablet Take 1 tablet (20 mg total) by mouth daily at 12 noon. 90 tablet 2   famotidine (PEPCID) 20 MG tablet Take 1 tablet (20 mg total) by  mouth 2 (two) times daily. 60 tablet 6   ferrous sulfate 325 (65 FE) MG tablet Take 1 tablet (325 mg total) by mouth daily. 30 tablet 4   metFORMIN (GLUCOPHAGE) 1000 MG tablet Take 1 tablet (1,000 mg total) by mouth 2 (two) times daily with a meal. 180 tablet 3   methocarbamol (ROBAXIN) 500 MG tablet Take 1 tablet (500 mg total) by mouth every 6 (six) hours as needed for muscle spasms. 90 tablet 2   sitaGLIPtin (JANUVIA) 100 MG tablet Take 1 tablet (100 mg total) by mouth daily. 90 tablet 3   valsartan-hydrochlorothiazide (DIOVAN-HCT) 320-25 MG tablet Take 1 tablet by mouth daily. 90 tablet 3   Vitamin D, Ergocalciferol, (DRISDOL) 1.25 MG (50000 UNIT) CAPS capsule Take 1 capsule (50,000 Units total) by mouth every 7 (seven) days. Please get updated vit D level. 4 capsule 0   No current facility-administered medications for this visit.    PHYSICAL EXAMINATION: ECOG PERFORMANCE STATUS: 2 - Symptomatic, <50% confined to bed  Vitals:   03/17/23 1145  BP: (!) 144/74  Pulse: 77  Resp: 18  Temp: 97.6 F (36.4 C)  SpO2: 100%   Wt Readings from Last 3 Encounters:  03/20/22 191 lb 12.8 oz (87 kg)  02/28/22 191 lb 12.8 oz (87 kg)  05/14/21 169 lb 1.5 oz (76.7 kg)     GENERAL:alert, no distress and comfortable SKIN: skin color normal, no rashes or significant lesions EYES: normal, Conjunctiva are pink and non-injected, sclera clear  NEURO: alert & oriented x 3 with fluent speech   LABORATORY DATA:  I have reviewed the data as listed    Latest Ref Rng & Units 03/17/2023   11:07 AM 01/13/2023    9:59 AM 10/28/2022    2:03 PM  CBC  WBC 4.0 - 10.5 K/uL 7.2  6.8  6.9   Hemoglobin 13.0 - 17.0 g/dL 16.1  09.6  04.5   Hematocrit 39.0 - 52.0 % 33.3  32.1  31.5   Platelets 150 - 400 K/uL 257  264  290         Latest Ref Rng & Units 01/27/2023   10:44 AM 12/01/2022   10:27 AM 07/23/2022   10:06 AM  CMP  Glucose 70 - 99 mg/dL 96  CANCELED  409   BUN 6 - 24 mg/dL 22  CANCELED  18    Creatinine 0.76 - 1.27 mg/dL 8.11  CANCELED  9.14   Sodium 134 - 144 mmol/L 134  CANCELED  134   Potassium 3.5 - 5.2 mmol/L 4.7  CANCELED  4.6   Chloride 96 - 106 mmol/L 97  CANCELED  98   CO2 20 - 29 mmol/L 20  CANCELED  18   Calcium 8.7 - 10.2 mg/dL 9.3  CANCELED  9.5   Total Protein 6.0 - 8.5 g/dL 7.1  CANCELED    Total Bilirubin 0.0 - 1.2 mg/dL <7.8  CANCELED    Alkaline Phos 44 - 121 IU/L 149  CANCELED  AST 0 - 40 IU/L 10  CANCELED    ALT 0 - 44 IU/L 11  CANCELED        RADIOGRAPHIC STUDIES: I have personally reviewed the radiological images as listed and agreed with the findings in the report. No results found.    No orders of the defined types were placed in this encounter.  All questions were answered. The patient knows to call the clinic with any problems, questions or concerns. No barriers to learning was detected. The total time spent in the appointment was 15 minutes.     Malachy Mood, MD 03/17/2023   Carolin Coy, CMA, am acting as scribe for Malachy Mood, MD.   I have reviewed the above documentation for accuracy and completeness, and I agree with the above.

## 2023-03-18 LAB — FOLATE RBC
Folate, Hemolysate: 302 ng/mL
Folate, RBC: 891 ng/mL (ref >498)
Hematocrit: 33.9 % — ABNORMAL LOW (ref 37.5–51.0)

## 2023-03-19 ENCOUNTER — Other Ambulatory Visit: Payer: Self-pay | Admitting: Hematology

## 2023-03-19 ENCOUNTER — Other Ambulatory Visit: Payer: Self-pay

## 2023-03-19 ENCOUNTER — Telehealth: Payer: Self-pay | Admitting: Hematology

## 2023-03-19 ENCOUNTER — Telehealth: Payer: Self-pay | Admitting: Critical Care Medicine

## 2023-03-19 ENCOUNTER — Telehealth: Payer: Self-pay

## 2023-03-19 NOTE — Telephone Encounter (Signed)
Contacted patient to scheduled appointments. Patient is aware of appointments that are scheduled.   

## 2023-03-19 NOTE — Telephone Encounter (Signed)
Copied from CRM [id number]. Topic: General - Other >> Mar 19, 2023  3:57 PM Everette C wrote: Reason for CRM: The patient's wife has called to follow up on the status of the patient's financial counseling   The patient has previously requested the orange and blue cards   Please contact further when possible

## 2023-03-19 NOTE — Telephone Encounter (Signed)
Spoke with pt's daughter to inform pt's daughter that Dr. Mosetta Putt has reviewed the pt's labs and the pt's iron is low.  Stated Dr. Mosetta Putt would like for the pt to get 3 doses of IV Iron infusions.  Pt's daughter agreed and wanted to schedule appts while on the phone w/this RN.  Dr. Latanya Maudlin scheduler was not available.  Informed daughter that Lynda Rainwater would contact her to get the pt scheduled for his IV Iron.  Pt verbalized understanding and had no further questions.

## 2023-03-22 ENCOUNTER — Other Ambulatory Visit: Payer: Self-pay

## 2023-03-26 ENCOUNTER — Inpatient Hospital Stay: Payer: Self-pay

## 2023-03-26 ENCOUNTER — Other Ambulatory Visit: Payer: Self-pay

## 2023-03-26 VITALS — BP 139/70 | HR 70 | Temp 98.1°F | Resp 14

## 2023-03-26 DIAGNOSIS — D500 Iron deficiency anemia secondary to blood loss (chronic): Secondary | ICD-10-CM

## 2023-03-26 MED ORDER — CETIRIZINE HCL 10 MG PO TABS
10.0000 mg | ORAL_TABLET | Freq: Once | ORAL | Status: AC
  Administered 2023-03-26: 10 mg via ORAL
  Filled 2023-03-26: qty 1

## 2023-03-26 MED ORDER — SODIUM CHLORIDE 0.9 % IV SOLN
300.0000 mg | Freq: Once | INTRAVENOUS | Status: AC
  Administered 2023-03-26: 300 mg via INTRAVENOUS
  Filled 2023-03-26: qty 300

## 2023-03-26 MED ORDER — SODIUM CHLORIDE 0.9 % IV SOLN: Freq: Once | INTRAVENOUS | Status: AC

## 2023-03-26 MED ORDER — ACETAMINOPHEN 325 MG PO TABS
650.0000 mg | ORAL_TABLET | Freq: Once | ORAL | Status: AC
  Administered 2023-03-26: 650 mg via ORAL
  Filled 2023-03-26: qty 2

## 2023-03-26 NOTE — Progress Notes (Signed)
Pt observed for 30 minutes post Venofer infusion. Pt tolerated trtmt well w/out incident. VSS at discharge.  Pt discharged via personal w/c to lobby.  Pt paraplegic, Pt used transfer board to transfer from infusion chair to w/c.

## 2023-03-26 NOTE — Patient Instructions (Signed)
Iron Sucrose Injection Qu es este medicamento? El HIERRO SACAROSA trata los niveles bajos de hierro (anemia por deficiencia de hierro) en personas con enfermedad renal. El hierro es un mineral que cumple una funcin importante en la produccin de glbulos rojos, que llevan el oxgeno de los pulmones al resto del cuerpo. Este medicamento puede ser utilizado para otros usos; si tiene alguna pregunta consulte con su proveedor de atencin mdica o con su farmacutico. MARCAS COMUNES: Venofer Qu le debo informar a mi profesional de la salud antes de tomar este medicamento? Necesitan saber si usted presenta alguno de los siguientes problemas o situaciones: Anemia no causada por niveles bajos de hierro Enfermedad cardiaca Niveles altos de hierro en la sangre Enfermedad renal Enfermedad heptica Una reaccin alrgica o inusual al hierro, a otros medicamentos, alimentos, colorantes o conservantes Si est embarazada o buscando quedar embarazada Si est amamantando a un beb Cmo debo utilizar este medicamento? Este medicamento se administra mediante infusin en una vena. Se administra en un hospital o en un entorno clnico. Hable con su equipo de atencin sobre el uso de este medicamento en nios. Aunque este medicamento se puede recetar a nios tan pequeos como de 2 aos de edad con ciertas afecciones, existen precauciones que deben tomarse. Sobredosis: Pngase en contacto inmediatamente con un centro toxicolgico o una sala de urgencia si usted cree que haya tomado demasiado medicamento. ATENCIN: Este medicamento es solo para usted. No comparta este medicamento con nadie. Qu sucede si me olvido de una dosis? Cumpla con las citas para dosis de seguimiento. Es importante no olvidar ninguna dosis. Llame a su equipo de atencin si no puede asistir a una cita. Qu puede interactuar con este medicamento? No use este medicamento con ninguno de los siguientes  productos: Deferoxamina Dimercaprol Otros productos con hierro Este medicamento tambin podra interactuar con los siguientes productos: Cloranfenicol Deferasirox Puede ser que esta lista no menciona todas las posibles interacciones. Informe a su profesional de la salud de todos los productos a base de hierbas, medicamentos de venta libre o suplementos nutritivos que est tomando. Si usted fuma, consume bebidas alcohlicas o si utiliza drogas ilegales, indqueselo tambin a su profesional de la salud. Algunas sustancias pueden interactuar con su medicamento. A qu debo estar atento al usar este medicamento? Visite peridicamente a su equipo de atencin. Si los sntomas no comienzan a mejorar o si empeoran, informe a su equipo de atencin. Usted podra necesitar realizarse anlisis de sangre mientras est usando este medicamento. Es posible que deba seguir una dieta especial. Hable con su equipo de atencin. Los alimentos que contienen hierro incluyen: granos enteros/cereales, frutas secas, legumbres, o guisantes, verduras de hojas verdes y asaduras (hgado, rin). Qu efectos secundarios puedo tener al utilizar este medicamento? Efectos secundarios que debe informar a su equipo de atencin tan pronto como sea posible: Reacciones alrgicas: erupcin cutnea, comezn/picazn, urticaria, hinchazn de la cara, los labios, la lengua o la garganta Presin arterial baja: mareo, sensacin de desmayo o aturdimiento, visin borrosa Falta de aliento Efectos secundarios que generalmente no requieren atencin mdica (debe informarlos a su equipo de atencin si persisten o si son molestos): Enrojecimiento Dolor de cabeza Dolor en las articulaciones Dolor muscular Nuseas Dolor, enrojecimiento o irritacin en el lugar de la inyeccin Puede ser que esta lista no menciona todos los posibles efectos secundarios. Comunquese a su mdico por asesoramiento mdico sobre los efectos secundarios. Usted puede  informar los efectos secundarios a la FDA por telfono al 1-800-FDA-1088. Dnde debo guardar mi medicina?   Este medicamento se administra en hospitales o clnicas y no es necesario guardarlo en su domicilio. ATENCIN: Este folleto es un resumen. Puede ser que no cubra toda la posible informacin. Si usted tiene preguntas acerca de esta medicina, consulte con su mdico, su farmacutico o su profesional de la salud.  2024 Elsevier/Gold Standard (2022-08-19 00:00:00)    

## 2023-04-01 ENCOUNTER — Other Ambulatory Visit: Payer: Self-pay

## 2023-04-01 ENCOUNTER — Encounter: Payer: Self-pay | Admitting: Hematology

## 2023-04-02 ENCOUNTER — Inpatient Hospital Stay: Payer: Self-pay

## 2023-04-02 ENCOUNTER — Other Ambulatory Visit: Payer: Self-pay

## 2023-04-02 VITALS — BP 139/72 | HR 72 | Temp 98.2°F | Resp 18

## 2023-04-02 DIAGNOSIS — D500 Iron deficiency anemia secondary to blood loss (chronic): Secondary | ICD-10-CM

## 2023-04-02 MED ORDER — SODIUM CHLORIDE 0.9 % IV SOLN: Freq: Once | INTRAVENOUS | Status: AC

## 2023-04-02 MED ORDER — SODIUM CHLORIDE 0.9 % IV SOLN
300.0000 mg | Freq: Once | INTRAVENOUS | Status: AC
  Administered 2023-04-02: 300 mg via INTRAVENOUS
  Filled 2023-04-02: qty 300

## 2023-04-02 MED ORDER — CETIRIZINE HCL 10 MG PO TABS
10.0000 mg | ORAL_TABLET | Freq: Once | ORAL | Status: AC
  Administered 2023-04-02: 10 mg via ORAL
  Filled 2023-04-02: qty 1

## 2023-04-02 MED ORDER — ACETAMINOPHEN 325 MG PO TABS
650.0000 mg | ORAL_TABLET | Freq: Once | ORAL | Status: AC
  Administered 2023-04-02: 650 mg via ORAL
  Filled 2023-04-02: qty 2

## 2023-04-02 NOTE — Progress Notes (Signed)
Patient declined 30 minute post iron infusion wait. Vss at discharge. Transferred to wheelchair and mobile to lobby.

## 2023-04-09 ENCOUNTER — Ambulatory Visit (HOSPITAL_COMMUNITY)
Admission: EM | Admit: 2023-04-09 | Discharge: 2023-04-09 | Disposition: A | Discharge status: Home / Self Care | Payer: Self-pay | Attending: Family Medicine | Admitting: Family Medicine

## 2023-04-09 ENCOUNTER — Encounter (HOSPITAL_COMMUNITY): Payer: Self-pay

## 2023-04-09 ENCOUNTER — Other Ambulatory Visit: Payer: Self-pay

## 2023-04-09 ENCOUNTER — Inpatient Hospital Stay: Payer: Self-pay

## 2023-04-09 ENCOUNTER — Encounter: Payer: Self-pay | Admitting: Hematology

## 2023-04-09 DIAGNOSIS — N390 Urinary tract infection, site not specified: Secondary | ICD-10-CM | POA: Insufficient documentation

## 2023-04-09 DIAGNOSIS — R509 Fever, unspecified: Secondary | ICD-10-CM | POA: Insufficient documentation

## 2023-04-09 DIAGNOSIS — J029 Acute pharyngitis, unspecified: Secondary | ICD-10-CM | POA: Insufficient documentation

## 2023-04-09 DIAGNOSIS — Z1152 Encounter for screening for COVID-19: Secondary | ICD-10-CM | POA: Insufficient documentation

## 2023-04-09 LAB — CBC WITH DIFFERENTIAL/PLATELET
Abs Immature Granulocytes: 0.05 10*3/uL (ref 0.00–0.07)
Basophils Absolute: 0 10*3/uL (ref 0.0–0.1)
Basophils Relative: 0 %
Eosinophils Absolute: 0 10*3/uL (ref 0.0–0.5)
Eosinophils Relative: 0 %
HCT: 29.8 % — ABNORMAL LOW (ref 39.0–52.0)
Hemoglobin: 9.8 g/dL — ABNORMAL LOW (ref 13.0–17.0)
Immature Granulocytes: 0 %
Lymphocytes Relative: 11 %
Lymphs Abs: 1.2 10*3/uL (ref 0.7–4.0)
MCH: 28 pg (ref 26.0–34.0)
MCHC: 32.9 g/dL (ref 30.0–36.0)
MCV: 85.1 fL (ref 80.0–100.0)
Monocytes Absolute: 1.3 10*3/uL — ABNORMAL HIGH (ref 0.1–1.0)
Monocytes Relative: 12 %
Neutro Abs: 8.5 10*3/uL — ABNORMAL HIGH (ref 1.7–7.7)
Neutrophils Relative %: 77 %
Platelets: 304 10*3/uL (ref 150–400)
RBC: 3.5 MIL/uL — ABNORMAL LOW (ref 4.22–5.81)
RDW: 14.7 % (ref 11.5–15.5)
WBC: 11.2 10*3/uL — ABNORMAL HIGH (ref 4.0–10.5)
nRBC: 0 % (ref 0.0–0.2)

## 2023-04-09 LAB — POCT URINALYSIS DIP (MANUAL ENTRY)
Bilirubin, UA: NEGATIVE
Glucose, UA: NEGATIVE mg/dL
Ketones, POC UA: NEGATIVE mg/dL
Nitrite, UA: NEGATIVE
Protein Ur, POC: >=300 mg/dL — AB
Spec Grav, UA: 1.02 (ref 1.010–1.025)
Urobilinogen, UA: 1 E.U./dL
pH, UA: 5.5 (ref 5.0–8.0)

## 2023-04-09 LAB — COMPREHENSIVE METABOLIC PANEL
ALT: 20 U/L (ref 0–44)
AST: 17 U/L (ref 15–41)
Albumin: 2.4 g/dL — ABNORMAL LOW (ref 3.5–5.0)
Alkaline Phosphatase: 99 U/L (ref 38–126)
Anion gap: 10 (ref 5–15)
BUN: 27 mg/dL — ABNORMAL HIGH (ref 6–20)
CO2: 18 mmol/L — ABNORMAL LOW (ref 22–32)
Calcium: 8.4 mg/dL — ABNORMAL LOW (ref 8.9–10.3)
Chloride: 101 mmol/L (ref 98–111)
Creatinine, Ser: 1.16 mg/dL (ref 0.61–1.24)
GFR, Estimated: >60 mL/min (ref >60)
Glucose, Bld: 181 mg/dL — ABNORMAL HIGH (ref 70–99)
Potassium: 3.8 mmol/L (ref 3.5–5.1)
Sodium: 129 mmol/L — ABNORMAL LOW (ref 135–145)
Total Bilirubin: 0.7 mg/dL (ref 0.3–1.2)
Total Protein: 7 g/dL (ref 6.5–8.1)

## 2023-04-09 LAB — POCT RAPID STREP A (OFFICE): Rapid Strep A Screen: NEGATIVE

## 2023-04-09 LAB — POCT FASTING CBG KUC MANUAL ENTRY: POCT Glucose (KUC): 191 mg/dL — AB (ref 70–99)

## 2023-04-09 MED ORDER — CIPROFLOXACIN HCL 500 MG PO TABS
500.0000 mg | ORAL_TABLET | Freq: Two times a day (BID) | ORAL | 0 refills | Status: DC
  Filled 2023-04-09: qty 14, 7d supply, fill #0

## 2023-04-09 MED ORDER — ACETAMINOPHEN 325 MG PO TABS
650.0000 mg | ORAL_TABLET | Freq: Once | ORAL | Status: AC
  Administered 2023-04-09: 650 mg via ORAL

## 2023-04-09 MED ORDER — CEFTRIAXONE SODIUM 1 G IJ SOLR
1.0000 g | Freq: Once | INTRAMUSCULAR | Status: AC
  Administered 2023-04-09: 1 g via INTRAMUSCULAR

## 2023-04-09 MED ORDER — LIDOCAINE HCL (PF) 1 % IJ SOLN
INTRAMUSCULAR | Status: AC
  Filled 2023-04-09: qty 2

## 2023-04-09 MED ORDER — CEFTRIAXONE SODIUM 1 G IJ SOLR
INTRAMUSCULAR | Status: AC
  Filled 2023-04-09: qty 10

## 2023-04-09 MED ORDER — ACETAMINOPHEN 325 MG PO TABS
ORAL_TABLET | ORAL | Status: AC
  Filled 2023-04-09: qty 2

## 2023-04-09 NOTE — Progress Notes (Signed)
Patient febrile with oral temp of 100.6. With use of the interpreter machine patient stated that he has had intermittent chills and headache for the past 3 days. He does not have a thermometer to check his temperature at home. Patient PCP is [facility]. Contacted Dr. Bertis Ruddy (for Dr. Mosetta Putt) and explained the above information. Dr. Bertis Ruddy advised for venofer to be delayed for 1 week. Explained to patient that the venofer will be delayed by one week and encouraged him to go to an urgent care. Upon request provided the address and phone number for the [facility]. Patient and spouse verbalized understanding and had no other questions or concerns.

## 2023-04-09 NOTE — Discharge Instructions (Addendum)
Rapid strep test was negative.  Throat culture is pending.  Urine sample shows large bacteria concerning for UTI covering with ciprofloxacin 500 mg twice daily for 7 days.  Also gave a ceftriaxone 1 g injection which is a high potency antibiotic which will help readily clear urinary tract infection. As discussed red flag symptoms if his fever continues to increase despite being given Tylenol if he develops a fever greater than 103 he needs to be immediately taken to the emergency department.  If his blood sugar goes above 300 or the meter reads high this is also indication that he needs to be seen at the emergency department.  The remainder of the blood work will result sometime today.  Office will contact you with use of a Spanish interpreter to relate any abnormal results.  If your results are normal continue with treatment prescribed here in clinic. Give next dose of Tylenol at 12 PM and every 4 hours fever is present which is a temperature greater than 100 F.

## 2023-04-09 NOTE — ED Provider Notes (Signed)
[facility]    CSN: [id number] Arrival date & time: 04/09/23  4098      History   Chief Complaint Chief Complaint  Patient presents with   Fever    HPI John Meza is a 62 y.o. male.   HPI Patient with a history of insulin-dependent diabetes, pyelonephritis, sepsis due to E. coli presents today for evaluation of fever and associated symptoms of chills, sore throat, and headache x 3 days.  Patient was seen at another clinic this morning for an infusion however unable to receive infusion as on arrival patient was febrile and was advised to come in for evaluation.  Patient's wife is here with him today reports that patient's urine is an abnormal odor and abnormal color.  Patient has neurogenic bladder and he is a paraplegic and self caths and has a history of urosepsis.  Patient is also a type II diabetic and wife also reports that his blood sugars have been certainly higher than baseline patient not eating very much.  Patient is not having a cough or any difficulty breathing.  He does endorse abdominal discomfort and feeling bloated although denies any specific area of pain.  Wife reports fever has been present for 3 days and she has been treating fever with Tylenol.  No known sick exposures. Past Medical History:  Diagnosis Date   Cellulitis and abscess of buttock 10/2016   Diabetes mellitus    ESBL (extended spectrum beta-lactamase) producing bacteria infection 03/16/2022   Hyperkalemia 03/16/2022   Hypertension    Myositis 04/21/2022   Paraplegia (HCC) 2013   fell from ladder   Pressure ulcer, buttock, right, unstageable (HCC) 12/30/2020   Pyelonephritis due to Escherichia coli 03/16/2022   Sacral decubitus ulcer, stage IV (HCC) 11/03/2021   SCI (spinal cord injury)    Sepsis due to Escherichia coli (E. coli) (HCC) 03/01/2022   Spine fracture 11/16/2011   T 11- T9-L1    Patient Active Problem List   Diagnosis Date Noted   Hyperlipidemia associated with type 2  diabetes mellitus (HCC) 12/01/2022   Dental caries 12/01/2022   Iron deficiency anemia due to chronic blood loss 11/14/2022   Anemia of chronic disease 11/14/2022   Medication monitoring encounter 04/21/2022   Normocytic anemia 03/03/2022   Wheelchair dependence 12/23/2020   History of cholecystectomy 11/18/2020   Essential hypertension    Neurogenic bladder 01/06/2012   Neurogenic bowel 01/06/2012   Paraplegia (HCC) 11/16/2011   Controlled type 2 diabetes mellitus (HCC) 05/26/2007   OBESITY, MODERATE 05/26/2007    Past Surgical History:  Procedure Laterality Date   CHOLECYSTECTOMY N/A 05/01/2015   Procedure: LAPAROSCOPIC CHOLECYSTECTOMY WITH ATTEMPTED INTRAOPERATIVE CHOLANGIOGRAM;  Surgeon: Violeta Gelinas, MD;  Location: [facility] OR;  Service: General;  Laterality: N/A;   SPINE SURGERY     TEE WITHOUT CARDIOVERSION N/A 03/26/2022   Procedure: TRANSESOPHAGEAL ECHOCARDIOGRAM (TEE);  Surgeon: Elease Hashimoto Deloris Ping, MD;  Location: [facility] ENDOSCOPY;  Service: Cardiovascular;  Laterality: N/A;       Home Medications    Prior to Admission medications   Medication Sig Start Date End Date Taking? Authorizing Provider  atorvastatin (LIPITOR) 20 MG tablet Take 1 tablet (20 mg total) by mouth daily at 12 noon. 12/01/22  Yes Storm Frisk, MD  ciprofloxacin (CIPRO) 500 MG tablet Take 1 tablet (500 mg total) by mouth every 12 (twelve) hours for 7 days. 04/09/23 04/16/23 Yes Bing Neighbors, NP  famotidine (PEPCID) 20 MG tablet Take 1 tablet (20 mg total) by  mouth 2 (two) times daily. 12/01/22  Yes Storm Frisk, MD  ferrous sulfate 325 (65 FE) MG tablet Take 1 tablet (325 mg total) by mouth daily. 12/01/22  Yes Storm Frisk, MD  metFORMIN (GLUCOPHAGE) 1000 MG tablet Take 1 tablet (1,000 mg total) by mouth 2 (two) times daily with a meal. 12/01/22  Yes Storm Frisk, MD  methocarbamol (ROBAXIN) 500 MG tablet Take 1 tablet (500 mg total) by mouth every 6 (six) hours as needed for muscle spasms.  07/23/22  Yes Storm Frisk, MD  sitaGLIPtin (JANUVIA) 100 MG tablet Take 1 tablet (100 mg total) by mouth daily. 12/01/22  Yes Storm Frisk, MD  valsartan-hydrochlorothiazide (DIOVAN-HCT) 320-25 MG tablet Take 1 tablet by mouth daily. 12/01/22  Yes Storm Frisk, MD  Vitamin D, Ergocalciferol, (DRISDOL) 1.25 MG (50000 UNIT) CAPS capsule Take 1 capsule (50,000 Units total) by mouth every 7 (seven) days. Please get updated vit D level. 03/03/23  Yes Storm Frisk, MD  loratadine (CLARITIN) 10 MG tablet Take 1 tablet (10 mg total) by mouth daily. As needed for itchy throat/allergy symptoms Patient not taking: Reported on 03/24/2019 12/26/18 07/05/20  Cain Saupe, MD    Family History Family History  Problem Relation Age of Onset   Healthy Mother    Diabetes Father    Diabetes Sister     Social History Social History   Tobacco Use   Smoking status: Former    Packs/day: 1.00    Years: 5.00    Additional pack years: 0.00    Total pack years: 5.00    Types: Cigarettes    Quit date: 10/13/2011    Years since quitting: 11.4   Smokeless tobacco: Never   Tobacco comments:    03-24-19 per pt he stopped 1 mo ago   Vaping Use   Vaping Use: Never used  Substance Use Topics   Alcohol use: Not Currently    Alcohol/week: 1.0 standard drink of alcohol    Types: 1 Cans of beer per week   Drug use: Never     Allergies   Macrobid [nitrofurantoin]   Review of Systems Review of Systems Pertinent negatives listed in HPI  Physical Exam Triage Vital Signs ED Triage Vitals  Enc Vitals Group     BP 04/09/23 0905 (!) 149/83     Pulse Rate 04/09/23 0905 (!) 126     Resp 04/09/23 0905 17     Temp 04/09/23 0905 (!) 102.3 F (39.1 C)     Temp Source 04/09/23 0905 Oral     SpO2 04/09/23 0905 97 %     Weight --      Height --      Head Circumference --      Peak Flow --      Pain Score 04/09/23 0904 2     Pain Loc --      Pain Edu? --      Excl. in GC? --    No data  found.  Updated Vital Signs BP 112/72 (BP Location: Left Arm)   Pulse (!) 129   Temp (!) 102 F (38.9 C) (Oral)   Resp 18   SpO2 97%   Visual Acuity Right Eye Distance:   Left Eye Distance:   Bilateral Distance:    Right Eye Near:   Left Eye Near:    Bilateral Near:     Physical Exam Vitals reviewed.  Constitutional:      General: He is not  in acute distress.    Appearance: He is not ill-appearing, toxic-appearing or diaphoretic.  HENT:     Head: Normocephalic and atraumatic.     Right Ear: Tympanic membrane, ear canal and external ear normal.     Left Ear: Tympanic membrane, ear canal and external ear normal.     Nose: Nose normal.     Mouth/Throat:     Pharynx: Posterior oropharyngeal erythema present. No oropharyngeal exudate.  Eyes:     Extraocular Movements: Extraocular movements intact.     Conjunctiva/sclera: Conjunctivae normal.     Pupils: Pupils are equal, round, and reactive to light.  Cardiovascular:     Rate and Rhythm: Regular rhythm. Tachycardia present.  Pulmonary:     Effort: Pulmonary effort is normal.     Breath sounds: Normal breath sounds.  Abdominal:     General: Abdomen is protuberant.     Tenderness: There is no abdominal tenderness.     Comments: Hyperactive bowel sounds heard throughout all quadrants. No reproducible abdominal pain.   Musculoskeletal:     Cervical back: Normal range of motion.     Comments: Patient is a paraplegic confined to wheelchair  Lymphadenopathy:     Cervical: No cervical adenopathy.  Skin:    General: Skin is warm and dry.  Neurological:     Mental Status: He is alert. Mental status is at baseline.      UC Treatments / Results  Labs (all labs ordered are listed, but only abnormal results are displayed) Labs Reviewed  POCT URINALYSIS DIP (MANUAL ENTRY) - Abnormal; Notable for the following components:      Result Value   Blood, UA moderate (*)    Protein Ur, POC >=300 (*)    Leukocytes, UA Large (3+)  (*)    All other components within normal limits  POCT FASTING CBG [facility] MANUAL ENTRY - Abnormal; Notable for the following components:   POCT Glucose ([facility]) 191 (*)    All other components within normal limits  SARS CORONAVIRUS 2 (TAT 6-24 HRS)  URINE CULTURE  CBC WITH DIFFERENTIAL/PLATELET  COMPREHENSIVE METABOLIC PANEL  POCT RAPID STREP A (OFFICE)    EKG   Radiology No results found.  Procedures Procedures (including critical care time)  Medications Ordered in UC Medications  acetaminophen (TYLENOL) tablet 650 mg (650 mg Oral Given 04/09/23 0914)  cefTRIAXone (ROCEPHIN) injection 1 g (1 g Intramuscular Given 04/09/23 1011)    Initial Impression / Assessment and Plan / UC Course  I have reviewed the triage vital signs and the nursing notes.  Pertinent labs & imaging results that were available during my care of the patient were reviewed by me and considered in my medical decision making (see chart for details).   Differentials include COVID 19 infection, UTI, streptococcal infection, and patient's history of sepsis and UA significant for large leukocytes, protein, and RBCs, fever, will cover with Rocephin 1 g and ofloxacin 500 mg twice daily for 7 days for complicated UTI given patient has a neurogenic bladder and requires self-catheterization.  Rapid strep was negative, evaluation of the throat was not impressive for active strep infection therefore culture was not ordered.  CMP pending to evaluate electrolyte and renal function.  CBC pending to evaluate hemoglobin and white count.  Wife and patient advised of red flag symptoms that would warrant immediate evaluation in the emergency department.  Also advised that we will contact them via Spanish interpreter if any of the lab results are abnormal.  Patient and  wife verbalized understanding and agreement with today's plan. Final Clinical Impressions(s) / UC Diagnoses   Final diagnoses:  Febrile illness  Encounter for screening for  COVID-19  Acute pharyngitis, unspecified etiology  Complicated UTI (urinary tract infection)     Discharge Instructions      Rapid strep test was negative.  Throat culture is pending.  Urine sample shows large bacteria concerning for UTI covering with ciprofloxacin 500 mg twice daily for 7 days.  Also gave a ceftriaxone 1 g injection which is a high potency antibiotic which will help readily clear urinary tract infection. As discussed red flag symptoms if his fever continues to increase despite being given Tylenol if he develops a fever greater than 103 he needs to be immediately taken to the emergency department.  If his blood sugar goes above 300 or the meter reads high this is also indication that he needs to be seen at the emergency department.  The remainder of the blood work will result sometime today.  Office will contact you with use of a Spanish interpreter to relate any abnormal results.  If your results are normal continue with treatment prescribed here in clinic. Give next dose of Tylenol at 12 PM and every 4 hours fever is present which is a temperature greater than 100 F.     ED Prescriptions     Medication Sig Dispense Auth. Provider   ciprofloxacin (CIPRO) 500 MG tablet Take 1 tablet (500 mg total) by mouth every 12 (twelve) hours for 7 days. 14 tablet Bing Neighbors, NP      PDMP not reviewed this encounter.   Bing Neighbors, NP 04/09/23 1133

## 2023-04-09 NOTE — ED Triage Notes (Signed)
Patient here today with c/o fever, chills, ST, and headache X 3 days. He has not taken anything for his symptoms. No sick contacts. No recent travel.

## 2023-04-10 LAB — URINE CULTURE: Culture: >=100000 — AB

## 2023-04-10 LAB — SARS CORONAVIRUS 2 (TAT 6-24 HRS): SARS Coronavirus 2: NEGATIVE

## 2023-04-11 ENCOUNTER — Emergency Department (HOSPITAL_COMMUNITY): Payer: MEDICAID

## 2023-04-11 ENCOUNTER — Encounter (HOSPITAL_COMMUNITY): Payer: Self-pay

## 2023-04-11 ENCOUNTER — Other Ambulatory Visit: Payer: Self-pay

## 2023-04-11 ENCOUNTER — Inpatient Hospital Stay (HOSPITAL_COMMUNITY)
Admission: EM | Admit: 2023-04-11 | Discharge: 2023-04-15 | DRG: 698 | Disposition: A | Discharge status: Home / Self Care | Payer: MEDICAID | Attending: Internal Medicine | Admitting: Internal Medicine

## 2023-04-11 DIAGNOSIS — Y846 Urinary catheterization as the cause of abnormal reaction of the patient, or of later complication, without mention of misadventure at the time of the procedure: Secondary | ICD-10-CM | POA: Diagnosis present

## 2023-04-11 DIAGNOSIS — R6520 Severe sepsis without septic shock: Secondary | ICD-10-CM | POA: Diagnosis present

## 2023-04-11 DIAGNOSIS — R197 Diarrhea, unspecified: Secondary | ICD-10-CM | POA: Diagnosis present

## 2023-04-11 DIAGNOSIS — Z6832 Body mass index (BMI) 32.0-32.9, adult: Secondary | ICD-10-CM

## 2023-04-11 DIAGNOSIS — E1169 Type 2 diabetes mellitus with other specified complication: Secondary | ICD-10-CM | POA: Diagnosis present

## 2023-04-11 DIAGNOSIS — L89310 Pressure ulcer of right buttock, unstageable: Secondary | ICD-10-CM | POA: Diagnosis present

## 2023-04-11 DIAGNOSIS — E872 Acidosis, unspecified: Secondary | ICD-10-CM | POA: Diagnosis present

## 2023-04-11 DIAGNOSIS — E876 Hypokalemia: Secondary | ICD-10-CM | POA: Diagnosis present

## 2023-04-11 DIAGNOSIS — E8809 Other disorders of plasma-protein metabolism, not elsewhere classified: Secondary | ICD-10-CM | POA: Diagnosis present

## 2023-04-11 DIAGNOSIS — L89319 Pressure ulcer of right buttock, unspecified stage: Secondary | ICD-10-CM

## 2023-04-11 DIAGNOSIS — T83518A Infection and inflammatory reaction due to other urinary catheter, initial encounter: Principal | ICD-10-CM | POA: Diagnosis present

## 2023-04-11 DIAGNOSIS — N179 Acute kidney failure, unspecified: Secondary | ICD-10-CM | POA: Insufficient documentation

## 2023-04-11 DIAGNOSIS — Z87440 Personal history of urinary (tract) infections: Secondary | ICD-10-CM

## 2023-04-11 DIAGNOSIS — I10 Essential (primary) hypertension: Secondary | ICD-10-CM | POA: Diagnosis present

## 2023-04-11 DIAGNOSIS — E871 Hypo-osmolality and hyponatremia: Secondary | ICD-10-CM | POA: Diagnosis present

## 2023-04-11 DIAGNOSIS — D638 Anemia in other chronic diseases classified elsewhere: Secondary | ICD-10-CM | POA: Diagnosis present

## 2023-04-11 DIAGNOSIS — A419 Sepsis, unspecified organism: Secondary | ICD-10-CM | POA: Diagnosis present

## 2023-04-11 DIAGNOSIS — G8220 Paraplegia, unspecified: Secondary | ICD-10-CM | POA: Diagnosis present

## 2023-04-11 DIAGNOSIS — E785 Hyperlipidemia, unspecified: Secondary | ICD-10-CM | POA: Diagnosis present

## 2023-04-11 DIAGNOSIS — N319 Neuromuscular dysfunction of bladder, unspecified: Secondary | ICD-10-CM | POA: Diagnosis present

## 2023-04-11 DIAGNOSIS — Z87891 Personal history of nicotine dependence: Secondary | ICD-10-CM

## 2023-04-11 DIAGNOSIS — E669 Obesity, unspecified: Secondary | ICD-10-CM | POA: Diagnosis present

## 2023-04-11 DIAGNOSIS — D509 Iron deficiency anemia, unspecified: Secondary | ICD-10-CM | POA: Diagnosis present

## 2023-04-11 DIAGNOSIS — N390 Urinary tract infection, site not specified: Secondary | ICD-10-CM | POA: Diagnosis present

## 2023-04-11 DIAGNOSIS — Z7984 Long term (current) use of oral hypoglycemic drugs: Secondary | ICD-10-CM

## 2023-04-11 DIAGNOSIS — Z833 Family history of diabetes mellitus: Secondary | ICD-10-CM

## 2023-04-11 DIAGNOSIS — E119 Type 2 diabetes mellitus without complications: Secondary | ICD-10-CM

## 2023-04-11 DIAGNOSIS — Z603 Acculturation difficulty: Secondary | ICD-10-CM | POA: Diagnosis present

## 2023-04-11 DIAGNOSIS — Z79899 Other long term (current) drug therapy: Secondary | ICD-10-CM | POA: Diagnosis not present

## 2023-04-11 DIAGNOSIS — E1165 Type 2 diabetes mellitus with hyperglycemia: Secondary | ICD-10-CM | POA: Diagnosis present

## 2023-04-11 DIAGNOSIS — Z881 Allergy status to other antibiotic agents status: Secondary | ICD-10-CM

## 2023-04-11 DIAGNOSIS — Z8619 Personal history of other infectious and parasitic diseases: Secondary | ICD-10-CM | POA: Insufficient documentation

## 2023-04-11 LAB — CBC WITH DIFFERENTIAL/PLATELET
Abs Immature Granulocytes: 0.15 10*3/uL — ABNORMAL HIGH (ref 0.00–0.07)
Basophils Absolute: 0 10*3/uL (ref 0.0–0.1)
Basophils Relative: 0 %
Eosinophils Absolute: 0 10*3/uL (ref 0.0–0.5)
Eosinophils Relative: 0 %
HCT: 26.2 % — ABNORMAL LOW (ref 39.0–52.0)
Hemoglobin: 8.8 g/dL — ABNORMAL LOW (ref 13.0–17.0)
Immature Granulocytes: 1 %
Lymphocytes Relative: 8 %
Lymphs Abs: 1.1 10*3/uL (ref 0.7–4.0)
MCH: 29.2 pg (ref 26.0–34.0)
MCHC: 33.6 g/dL (ref 30.0–36.0)
MCV: 87 fL (ref 80.0–100.0)
Monocytes Absolute: 1.4 10*3/uL — ABNORMAL HIGH (ref 0.1–1.0)
Monocytes Relative: 11 %
Neutro Abs: 10.9 10*3/uL — ABNORMAL HIGH (ref 1.7–7.7)
Neutrophils Relative %: 80 %
Platelets: 323 10*3/uL (ref 150–400)
RBC: 3.01 MIL/uL — ABNORMAL LOW (ref 4.22–5.81)
RDW: 14.6 % (ref 11.5–15.5)
WBC: 13.7 10*3/uL — ABNORMAL HIGH (ref 4.0–10.5)
nRBC: 0 % (ref 0.0–0.2)

## 2023-04-11 LAB — URINALYSIS, W/ REFLEX TO CULTURE (INFECTION SUSPECTED)
Bilirubin Urine: NEGATIVE
Glucose, UA: 50 mg/dL — AB
Hgb urine dipstick: NEGATIVE
Ketones, ur: NEGATIVE mg/dL
Nitrite: NEGATIVE
Protein, ur: 100 mg/dL — AB
Specific Gravity, Urine: 1.015 (ref 1.005–1.030)
pH: 5 (ref 5.0–8.0)

## 2023-04-11 LAB — BASIC METABOLIC PANEL
Anion gap: 14 (ref 5–15)
BUN: 28 mg/dL — ABNORMAL HIGH (ref 6–20)
CO2: 15 mmol/L — ABNORMAL LOW (ref 22–32)
Calcium: 8 mg/dL — ABNORMAL LOW (ref 8.9–10.3)
Chloride: 98 mmol/L (ref 98–111)
Creatinine, Ser: 1.42 mg/dL — ABNORMAL HIGH (ref 0.61–1.24)
GFR, Estimated: 57 mL/min — ABNORMAL LOW (ref >60)
Glucose, Bld: 209 mg/dL — ABNORMAL HIGH (ref 70–99)
Potassium: 3.2 mmol/L — ABNORMAL LOW (ref 3.5–5.1)
Sodium: 127 mmol/L — ABNORMAL LOW (ref 135–145)

## 2023-04-11 LAB — GLUCOSE, CAPILLARY
Glucose-Capillary: 175 mg/dL — ABNORMAL HIGH (ref 70–99)
Glucose-Capillary: 187 mg/dL — ABNORMAL HIGH (ref 70–99)

## 2023-04-11 LAB — LACTIC ACID, PLASMA
Lactic Acid, Venous: 1.6 mmol/L (ref 0.5–1.9)
Lactic Acid, Venous: 1.4 mmol/L (ref 0.5–1.9)

## 2023-04-11 LAB — HIV ANTIBODY (ROUTINE TESTING W REFLEX): HIV Screen 4th Generation wRfx: NONREACTIVE

## 2023-04-11 MED ORDER — ENOXAPARIN SODIUM 40 MG/0.4ML IJ SOSY
40.0000 mg | PREFILLED_SYRINGE | INTRAMUSCULAR | Status: DC
  Administered 2023-04-11 – 2023-04-14 (×4): 40 mg via SUBCUTANEOUS
  Filled 2023-04-11 (×4): qty 0.4

## 2023-04-11 MED ORDER — ACETAMINOPHEN 650 MG RE SUPP: 650.0000 mg | Freq: Four times a day (QID) | RECTAL | Status: DC | PRN

## 2023-04-11 MED ORDER — SODIUM CHLORIDE 0.9 % IV SOLN: 1.0000 g | Freq: Once | INTRAVENOUS | Status: DC

## 2023-04-11 MED ORDER — ACETAMINOPHEN 500 MG PO TABS
1000.0000 mg | ORAL_TABLET | Freq: Once | ORAL | Status: AC
  Administered 2023-04-11: 1000 mg via ORAL
  Filled 2023-04-11: qty 2

## 2023-04-11 MED ORDER — ONDANSETRON HCL 4 MG PO TABS: 4.0000 mg | ORAL_TABLET | Freq: Four times a day (QID) | ORAL | Status: DC | PRN

## 2023-04-11 MED ORDER — LACTATED RINGERS IV BOLUS
30.0000 mL/kg | Freq: Once | INTRAVENOUS | Status: AC
  Administered 2023-04-11: 2109 mL via INTRAVENOUS

## 2023-04-11 MED ORDER — POTASSIUM CHLORIDE 20 MEQ PO PACK
20.0000 meq | PACK | Freq: Once | ORAL | Status: AC
  Administered 2023-04-11: 20 meq via ORAL
  Filled 2023-04-11: qty 1

## 2023-04-11 MED ORDER — METHOCARBAMOL 500 MG PO TABS: 500.0000 mg | ORAL_TABLET | Freq: Four times a day (QID) | ORAL | Status: DC | PRN

## 2023-04-11 MED ORDER — ACETAMINOPHEN 325 MG PO TABS
650.0000 mg | ORAL_TABLET | Freq: Four times a day (QID) | ORAL | Status: DC | PRN
  Administered 2023-04-12 – 2023-04-13 (×5): 650 mg via ORAL
  Filled 2023-04-11 (×5): qty 2

## 2023-04-11 MED ORDER — SODIUM CHLORIDE 0.9 % IV SOLN: INTRAVENOUS | Status: AC

## 2023-04-11 MED ORDER — SODIUM CHLORIDE 0.9 % IV SOLN
1.0000 g | Freq: Three times a day (TID) | INTRAVENOUS | Status: AC
  Administered 2023-04-11 – 2023-04-14 (×11): 1 g via INTRAVENOUS
  Filled 2023-04-11 (×11): qty 20

## 2023-04-11 MED ORDER — ONDANSETRON HCL 4 MG/2ML IJ SOLN: 4.0000 mg | Freq: Four times a day (QID) | INTRAMUSCULAR | Status: DC | PRN

## 2023-04-11 MED ORDER — FERROUS SULFATE 325 (65 FE) MG PO TABS
325.0000 mg | ORAL_TABLET | Freq: Every day | ORAL | Status: DC
  Administered 2023-04-12 – 2023-04-15 (×4): 325 mg via ORAL
  Filled 2023-04-11 (×4): qty 1

## 2023-04-11 MED ORDER — POLYETHYLENE GLYCOL 3350 17 G PO PACK: 17.0000 g | PACK | Freq: Every day | ORAL | Status: DC | PRN

## 2023-04-11 MED ORDER — MORPHINE SULFATE (PF) 2 MG/ML IV SOLN: 2.0000 mg | INTRAVENOUS | Status: DC | PRN

## 2023-04-11 MED ORDER — ATORVASTATIN CALCIUM 10 MG PO TABS
20.0000 mg | ORAL_TABLET | Freq: Every day | ORAL | Status: DC
  Administered 2023-04-12 – 2023-04-15 (×4): 20 mg via ORAL
  Filled 2023-04-11 (×4): qty 2

## 2023-04-11 MED ORDER — LACTATED RINGERS IV SOLN: INTRAVENOUS | Status: DC

## 2023-04-11 NOTE — H&P (Signed)
History and Physical    John Meza:[id number] DOB: 07/09/1961 DOA: 04/11/2023  PCP: Storm Frisk, MD Patient coming from: home  Chief Complaint: Fevers  HPI: John Meza is a 62 y.o. male with medical history significant of paraplegia, neurogenic bladder with self cath 3 times daily at home, recurrent UTI with history of ESBL UTI, T2DM, HTN, chronic right buttock pressure ulcer, former smoker who presented with fevers.  Patient and his wife are Spanish speakers and a iPad with Spanish interpreter at bedside it was used during the interview.  Patient reports that he has had intermittent fevers and chills over the last 5 days.  Associate symptoms included decreased appetite and generalized weakness.  He is paraplegic with neurogenic bladder and self caths 3 times a day at home.  His wife does his self caths for him and reports that she noted cloudy urine over the last 5 days.  Patient came to the ED on 04/09/2023 with fevers secondary to UTI.  He was given ceftriaxone 1 g IV x 1 and discharged home with Cipro 500 mg twice daily x 7 days.  Patient reports he was compliant with Cipro at home, however, he continued to have fevers with a Tmax 100.1 F at home.  He also reports watery diarrhea 3 To 4 times a day since he started Cipro.  He came to ED for further evaluation today.  In the ED, Tmax 101.80F, P100, RR 20s, BP 125/69 with O2 sats 100% on room air.  The labs showed sodium 127, potassium 3.2, CO2 15, BUN 28, creatinine 1.42, glucose 209, WBC 13.7, hemoglobin 8.8, Urine pyuria, blood and urine culture pending. CXR showed no acute illness.  Patient was started on meropenem.  Review of Systems: As per HPI otherwise 10 point review of systems negative.  Review of Systems Otherwise negative except as per HPI, including: General: Positive for fevers, chills, decreased appetite, and generalized weakness. Resp: Denies cough, wheezing, shortness of  breath. Cardiac: Denies chest pain, palpitations, orthopnea, paroxysmal nocturnal dyspnea. GI: Denies abdominal pain, nausea, vomiting, or constipation.  Positive for diarrhea. GU: Neurogenic bladder with self cath 3 times a day at home.  Cloudy urine noted for the last 5 days.  No hematuria. MS: Denies muscle aches, joint pain or swelling Neuro: Denies headache, neurologic deficits (focal weakness, numbness, tingling), abnormal gait Psych: Denies anxiety, depression, SI/HI/AVH Skin: Denies new rashes or lesions ID: Denies sick contacts, exotic exposures, travel  Past Medical History:  Diagnosis Date   Cellulitis and abscess of buttock 10/2016   Diabetes mellitus    ESBL (extended spectrum beta-lactamase) producing bacteria infection 03/16/2022   Hyperkalemia 03/16/2022   Hypertension    Myositis 04/21/2022   Paraplegia (HCC) 2013   fell from ladder   Pressure ulcer, buttock, right, unstageable (HCC) 12/30/2020   Pyelonephritis due to Escherichia coli 03/16/2022   Sacral decubitus ulcer, stage IV (HCC) 11/03/2021   SCI (spinal cord injury)    Sepsis due to Escherichia coli (E. coli) (HCC) 03/01/2022   Spine fracture 11/16/2011   T 11- T9-L1    Past Surgical History:  Procedure Laterality Date   CHOLECYSTECTOMY N/A 05/01/2015   Procedure: LAPAROSCOPIC CHOLECYSTECTOMY WITH ATTEMPTED INTRAOPERATIVE CHOLANGIOGRAM;  Surgeon: Violeta Gelinas, MD;  Location: [facility] OR;  Service: General;  Laterality: N/A;   SPINE SURGERY     TEE WITHOUT CARDIOVERSION N/A 03/26/2022   Procedure: TRANSESOPHAGEAL ECHOCARDIOGRAM (TEE);  Surgeon: Elease Hashimoto Deloris Ping, MD;  Location: [facility] ENDOSCOPY;  Service: Cardiovascular;  Laterality: N/A;    SOCIAL HISTORY:  reports that he quit smoking about 11 years ago. His smoking use included cigarettes. He has a 5.00 pack-year smoking history. He has never used smokeless tobacco. He reports that he does not currently use alcohol after a past usage of about 1.0 standard drink of alcohol  per week. He reports that he does not use drugs.  Allergies  Allergen Reactions   Macrobid [Nitrofurantoin] Itching and Rash    FAMILY HISTORY: Family History  Problem Relation Age of Onset   Healthy Mother    Diabetes Father    Diabetes Sister      Prior to Admission medications   Medication Sig Start Date End Date Taking? Authorizing Provider  atorvastatin (LIPITOR) 20 MG tablet Take 1 tablet (20 mg total) by mouth daily at 12 noon. 12/01/22  Yes Storm Frisk, MD  ciprofloxacin (CIPRO) 500 MG tablet Take 1 tablet (500 mg total) by mouth every 12 (twelve) hours for 7 days. 04/09/23 04/16/23 Yes Bing Neighbors, NP  famotidine (PEPCID) 20 MG tablet Take 1 tablet (20 mg total) by mouth 2 (two) times daily. 12/01/22  Yes Storm Frisk, MD  ferrous sulfate 325 (65 FE) MG tablet Take 1 tablet (325 mg total) by mouth daily. 12/01/22  Yes Storm Frisk, MD  metFORMIN (GLUCOPHAGE) 1000 MG tablet Take 1 tablet (1,000 mg total) by mouth 2 (two) times daily with a meal. 12/01/22  Yes Storm Frisk, MD  methocarbamol (ROBAXIN) 500 MG tablet Take 1 tablet (500 mg total) by mouth every 6 (six) hours as needed for muscle spasms. 07/23/22  Yes Storm Frisk, MD  sitaGLIPtin (JANUVIA) 100 MG tablet Take 1 tablet (100 mg total) by mouth daily. 12/01/22  Yes Storm Frisk, MD  valsartan-hydrochlorothiazide (DIOVAN-HCT) 320-25 MG tablet Take 1 tablet by mouth daily. 12/01/22  Yes Storm Frisk, MD  Vitamin D, Ergocalciferol, (DRISDOL) 1.25 MG (50000 UNIT) CAPS capsule Take 1 capsule (50,000 Units total) by mouth every 7 (seven) days. Please get updated vit D level. 03/03/23  Yes Storm Frisk, MD  loratadine (CLARITIN) 10 MG tablet Take 1 tablet (10 mg total) by mouth daily. As needed for itchy throat/allergy symptoms Patient not taking: Reported on 03/24/2019 12/26/18 07/05/20  Cain Saupe, MD    Physical Exam: Vitals:   04/11/23 1445 04/11/23 1530 04/11/23 1600 04/11/23 1615   BP: 128/73  131/74   Pulse: (!) 107 (!) 112 (!) 103 97  Resp: (!) 28 (!) 27 (!) 24 18  Temp:      TempSrc:      SpO2: 99% 100% 100% 100%  Weight:      Height:          Constitutional: NAD, calm, comfortable.  Acute ill-appearing. Eyes: PERRL, lids and conjunctivae normal ENMT: Mucous membranes are moist. Posterior pharynx clear of any exudate or lesions.Normal dentition.  Neck: normal, supple, no masses, no thyromegaly Respiratory: clear to auscultation bilaterally, no wheezing, no crackles. Normal respiratory effort. No accessory muscle use.  Cardiovascular: Regular rate and rhythm, no murmurs / rubs / gallops. No extremity edema. 2+ pedal pulses. No carotid bruits.  Abdomen: no tenderness, no masses palpated. No hepatosplenomegaly. Bowel sounds positive.   Musculoskeletal: no clubbing / cyanosis. No joint deformity upper and lower extremities. Good ROM, no contractures. Normal muscle tone.  Skin: no rashes, lesions, ulcers. No induration Neurologic: CN 2-12 grossly intact.  Chronic paraplegia. Neurogenic bladder with self cath 3 times daily  at home. Psychiatric: Normal judgment and insight. Alert and oriented x 3. Normal mood.     Labs on Admission: I have personally reviewed following labs and imaging studies  CBC: Recent Labs  Lab 04/09/23 0932 04/11/23 1301  WBC 11.2* 13.7*  NEUTROABS 8.5* 10.9*  HGB 9.8* 8.8*  HCT 29.8* 26.2*  MCV 85.1 87.0  PLT 304 323   Basic Metabolic Panel: Recent Labs  Lab 04/09/23 0932 04/11/23 1301  Shloma Roggenkamp 129* 127*  K 3.8 3.2*  CL 101 98  CO2 18* 15*  GLUCOSE 181* 209*  BUN 27* 28*  CREATININE 1.16 1.42*  CALCIUM 8.4* 8.0*   GFR: Estimated Creatinine Clearance: 55.7 mL/min (A) (by C-G formula based on SCr of 1.42 mg/dL (H)). Liver Function Tests: Recent Labs  Lab 04/09/23 0932  AST 17  ALT 20  ALKPHOS 99  BILITOT 0.7  PROT 7.0  ALBUMIN 2.4*   No results for input(s): "LIPASE", "AMYLASE" in the last 168 hours. No results  for input(s): "AMMONIA" in the last 168 hours. Coagulation Profile: No results for input(s): "INR", "PROTIME" in the last 168 hours. Cardiac Enzymes: No results for input(s): "CKTOTAL", "CKMB", "CKMBINDEX", "TROPONINI" in the last 168 hours. BNP (last 3 results) No results for input(s): "PROBNP" in the last 8760 hours. HbA1C: No results for input(s): "HGBA1C" in the last 72 hours. CBG: No results for input(s): "GLUCAP" in the last 168 hours. Lipid Profile: No results for input(s): "CHOL", "HDL", "LDLCALC", "TRIG", "CHOLHDL", "LDLDIRECT" in the last 72 hours. Thyroid Function Tests: No results for input(s): "TSH", "T4TOTAL", "FREET4", "T3FREE", "THYROIDAB" in the last 72 hours. Anemia Panel: No results for input(s): "VITAMINB12", "FOLATE", "FERRITIN", "TIBC", "IRON", "RETICCTPCT" in the last 72 hours. Urine analysis:    Component Value Date/Time   COLORURINE AMBER (A) 04/11/2023 1400   APPEARANCEUR CLOUDY (A) 04/11/2023 1400   APPEARANCEUR Clear 03/19/2022 1413   LABSPEC 1.015 04/11/2023 1400   PHURINE 5.0 04/11/2023 1400   GLUCOSEU 50 (A) 04/11/2023 1400   HGBUR NEGATIVE 04/11/2023 1400   BILIRUBINUR NEGATIVE 04/11/2023 1400   BILIRUBINUR negative 04/09/2023 0941   BILIRUBINUR Negative 03/19/2022 1413   KETONESUR NEGATIVE 04/11/2023 1400   PROTEINUR 100 (A) 04/11/2023 1400   UROBILINOGEN 1.0 04/09/2023 0941   UROBILINOGEN 1.0 02/28/2022 1240   NITRITE NEGATIVE 04/11/2023 1400   LEUKOCYTESUR MODERATE (A) 04/11/2023 1400   Sepsis Labs: !!!!!!!!!!!!!!!!!!!!!!!!!!!!!!!!!!!!!!!!!!!! @LABRCNTIP (procalcitonin:4,lacticidven:4) ) Recent Results (from the past 240 hour(s))  SARS CORONAVIRUS 2 (TAT 6-24 HRS) Anterior Nasal Swab     Status: None   Collection Time: 04/09/23  9:35 AM   Specimen: Anterior Nasal Swab  Result Value Ref Range Status   SARS Coronavirus 2 NEGATIVE NEGATIVE Final    Comment: (NOTE) SARS-CoV-2 target nucleic acids are NOT DETECTED.  The SARS-CoV-2 RNA is  generally detectable in upper and lower respiratory specimens during the acute phase of infection. Negative results do not preclude SARS-CoV-2 infection, do not rule out co-infections with other pathogens, and should not be used as the sole basis for treatment or other patient management decisions. Negative results must be combined with clinical observations, patient history, and epidemiological information. The expected result is Negative.  Fact Sheet for Patients: HairSlick.no  Fact Sheet for Healthcare Providers: quierodirigir.com  This test is not yet approved or cleared by the [place] FDA and  has been authorized for detection and/or diagnosis of SARS-CoV-2 by FDA under an Emergency Use Authorization (EUA). This EUA will remain  in effect (meaning this test can be  used) for the duration of the COVID-19 declaration under Se ction 564(b)(1) of the Act, 21 U.S.C. section 360bbb-3(b)(1), unless the authorization is terminated or revoked sooner.  Performed at [facility], [address], [place], [place] [address]   Urine Culture     Status: Abnormal (Preliminary result)   Collection Time: 04/09/23  9:53 AM   Specimen: Urine, Clean Catch  Result Value Ref Range Status   Specimen Description URINE, CLEAN CATCH  Final   Special Requests Normal  Final   Culture (A)  Final    >=100,000 COLONIES/mL KLEBSIELLA PNEUMONIAE >=100,000 COLONIES/mL MORGANELLA MORGANII SUSCEPTIBILITIES TO FOLLOW Performed at [facility], [address], [place], [place] [address]    Report Status PENDING  Incomplete   Organism ID, Bacteria KLEBSIELLA PNEUMONIAE (A)  Final      Susceptibility   Klebsiella pneumoniae - MIC*    AMPICILLIN >=32 RESISTANT Resistant     CEFAZOLIN <=4 SENSITIVE Sensitive     CEFEPIME <=0.12 SENSITIVE Sensitive     CEFTRIAXONE <=0.25 SENSITIVE Sensitive     CIPROFLOXACIN <=0.25 SENSITIVE Sensitive      GENTAMICIN <=1 SENSITIVE Sensitive     IMIPENEM <=0.25 SENSITIVE Sensitive     NITROFURANTOIN 32 SENSITIVE Sensitive     TRIMETH/SULFA <=20 SENSITIVE Sensitive     AMPICILLIN/SULBACTAM 16 INTERMEDIATE Intermediate     PIP/TAZO <=4 SENSITIVE Sensitive     * >=100,000 COLONIES/mL KLEBSIELLA PNEUMONIAE     Radiological Exams on Admission: DG Chest Port 1 View  Result Date: 04/11/2023 CLINICAL DATA:  Cough EXAM: PORTABLE CHEST 1 VIEW COMPARISON:  Chest x-ray April 25, 2022 FINDINGS: The cardiomediastinal silhouette is unchanged in contour. No focal pulmonary opacity. No pleural effusion or pneumothorax. The visualized upper abdomen is unremarkable. Partially visualized thoracic fusion hardware. No acute osseous abnormality. IMPRESSION: No acute cardiopulmonary abnormality. Electronically Signed   By: Jacob Moores M.D.   On: 04/11/2023 14:35     All images have been reviewed by me personally.  EKG: Independently reviewed.   Assessment/Plan Active Problems:   Controlled type 2 diabetes mellitus (HCC)   OBESITY, MODERATE   Paraplegia (HCC)   Neurogenic bladder   Hyponatremia   Essential hypertension   Hypokalemia   Complicated UTI (urinary tract infection)   Diarrhea   Anemia of chronic disease   Hyperlipidemia associated with type 2 diabetes mellitus (HCC)   Sepsis (HCC)   History of infection due to extended spectrum beta lactamase (ESBL) producing bacteria   AKI (acute kidney injury) (HCC)     Assessment and Plan: Assessment Plan  # Sepsis 2/2 complicated UTI # Hx of ESBL UTI # Neurogenic bladder with self cath TID at home   Patient met sepsis criteria.  Etiology likely related to recurrent UTI.  Patient was given Cipro 500 mg twice daily x 7 days for UTI during ED visit on 04/09/2023.  Urine culture on that day grew pansensitive Klebsiella.  Given his history of ESBL infection, will cover with meropenem pending urine culture results  -Telemetry -Blood and urine culture  pending -Urine culture on 6/28 reviewed -WBC 13.7 K, lactic acid is normal 1.6 -Antibiotics-empiric meropenem -Sepsis IV fluids protocol done -In and out cath 3 times daily   # Hyponatremia # Hypokalemia # AKI  - Jacobus Colvin 127, K 3.2, BUN 28, Cr 1.42, CO2 15 and GAP 14 - IVF and K replaced - BMP in am - hold home hydrochlorothiazide and ARB   # Chronic IDA  -  FU with [facility] hematology as outpt  # Diarrhea- reports watery diarrheas over last three days as soon as he started Cipro  - C diff pending  # T2DM # Obesity  Body mass index is 32.92 kg/m.  -Last hemoglobin A1c was 6.5 on 10/28/2022 -Repeat hemoglobin A1c is pending -Carb modified diet -BG Before meals and at bedtime - hold home oral DM meds - consider Insulin if he has persistent hyperBG.  -Weight loss per PCP as outpatient  # HTN  -Chronic and stable  # Chronic right buttock pressure ulcer # Paraplegia - wound care         DVT prophylaxis: Lovenox Code Status: Full code Family Communication: Wife at bedside Consults called: None needed Admission status: Inpatient  Status is: Inpatient Remains inpatient appropriate because: Patient will require minimum 2 night stay due to a history of complex UTI with ESBL UTI.  He is admitted with sepsis secondary to complicated UTI.  He will be placed on meropenem pending urine culture which takes 2-to 3 days to grow.    Time Spent: 65 minutes.  >50% of the time was devoted to discussing the patients care, assessment, plan and disposition with other care givers along with counseling the patient about the risks and benefits of treatment.    Dede Query MD [facility]  If 7PM-7AM, please contact night-coverage   04/11/2023, 5:10 PM

## 2023-04-11 NOTE — ED Triage Notes (Signed)
Pt c/o fever, but doesn't know what the temperature was. Pt denies any other sx.

## 2023-04-11 NOTE — ED Provider Notes (Signed)
[place] EMERGENCY DEPARTMENT AT [facility] Provider Note  CSN: [id number] Arrival date & time: 04/11/23 1244  Chief Complaint(s) Fever  HPI John Meza is a 62 y.o. male history of prior ESBL infection, diabetes, paraplegia from prior traumatic accident, chronic right buttock wound, presenting with fever.  Patient was seen at urgent care 2 days ago, at that time had had 3 days of fevers, chills, body aches, foul-smelling urine.  He was given a gram of ceftriaxone and discharged.  He reports that he has not gotten better since then.  He was sent home with antibiotics which he reports he has taken.  He performs self cath and his wife helps him.  He has chronic wound on his bottom which is unchanged.  He denies any cough, chest pain, abdominal pain, back pain.  Qualified Spanish interpreter used  Past Medical History Past Medical History:  Diagnosis Date   Cellulitis and abscess of buttock 10/2016   Diabetes mellitus    ESBL (extended spectrum beta-lactamase) producing bacteria infection 03/16/2022   Hyperkalemia 03/16/2022   Hypertension    Myositis 04/21/2022   Paraplegia (HCC) 2013   fell from ladder   Pressure ulcer, buttock, right, unstageable (HCC) 12/30/2020   Pyelonephritis due to Escherichia coli 03/16/2022   Sacral decubitus ulcer, stage IV (HCC) 11/03/2021   SCI (spinal cord injury)    Sepsis due to Escherichia coli (E. coli) (HCC) 03/01/2022   Spine fracture 11/16/2011   T 11- T9-L1   Patient Active Problem List   Diagnosis Date Noted   Sepsis (HCC) 04/11/2023   Hyperlipidemia associated with type 2 diabetes mellitus (HCC) 12/01/2022   Dental caries 12/01/2022   Iron deficiency anemia due to chronic blood loss 11/14/2022   Anemia of chronic disease 11/14/2022   Medication monitoring encounter 04/21/2022   Normocytic anemia 03/03/2022   Wheelchair dependence 12/23/2020   History of cholecystectomy 11/18/2020   Essential hypertension    Neurogenic  bladder 01/06/2012   Neurogenic bowel 01/06/2012   Paraplegia (HCC) 11/16/2011   Controlled type 2 diabetes mellitus (HCC) 05/26/2007   OBESITY, MODERATE 05/26/2007   Home Medication(s) Prior to Admission medications   Medication Sig Start Date End Date Taking? Authorizing Provider  atorvastatin (LIPITOR) 20 MG tablet Take 1 tablet (20 mg total) by mouth daily at 12 noon. 12/01/22  Yes Storm Frisk, MD  ciprofloxacin (CIPRO) 500 MG tablet Take 1 tablet (500 mg total) by mouth every 12 (twelve) hours for 7 days. 04/09/23 04/16/23 Yes Bing Neighbors, NP  famotidine (PEPCID) 20 MG tablet Take 1 tablet (20 mg total) by mouth 2 (two) times daily. 12/01/22  Yes Storm Frisk, MD  ferrous sulfate 325 (65 FE) MG tablet Take 1 tablet (325 mg total) by mouth daily. 12/01/22  Yes Storm Frisk, MD  metFORMIN (GLUCOPHAGE) 1000 MG tablet Take 1 tablet (1,000 mg total) by mouth 2 (two) times daily with a meal. 12/01/22  Yes Storm Frisk, MD  methocarbamol (ROBAXIN) 500 MG tablet Take 1 tablet (500 mg total) by mouth every 6 (six) hours as needed for muscle spasms. 07/23/22  Yes Storm Frisk, MD  sitaGLIPtin (JANUVIA) 100 MG tablet Take 1 tablet (100 mg total) by mouth daily. 12/01/22  Yes Storm Frisk, MD  valsartan-hydrochlorothiazide (DIOVAN-HCT) 320-25 MG tablet Take 1 tablet by mouth daily. 12/01/22  Yes Storm Frisk, MD  Vitamin D, Ergocalciferol, (DRISDOL) 1.25 MG (50000 UNIT) CAPS capsule Take 1 capsule (50,000 Units total) by  mouth every 7 (seven) days. Please get updated vit D level. 03/03/23  Yes Storm Frisk, MD  loratadine (CLARITIN) 10 MG tablet Take 1 tablet (10 mg total) by mouth daily. As needed for itchy throat/allergy symptoms Patient not taking: Reported on 03/24/2019 12/26/18 07/05/20  Cain Saupe, MD                                                                                                                                    Past Surgical History Past  Surgical History:  Procedure Laterality Date   CHOLECYSTECTOMY N/A 05/01/2015   Procedure: LAPAROSCOPIC CHOLECYSTECTOMY WITH ATTEMPTED INTRAOPERATIVE CHOLANGIOGRAM;  Surgeon: Violeta Gelinas, MD;  Location: [facility] OR;  Service: General;  Laterality: N/A;   SPINE SURGERY     TEE WITHOUT CARDIOVERSION N/A 03/26/2022   Procedure: TRANSESOPHAGEAL ECHOCARDIOGRAM (TEE);  Surgeon: [name], MD;  Location: [facility] ENDOSCOPY;  Service: Cardiovascular;  Laterality: N/A;   Family History Family History  Problem Relation Age of Onset   Healthy Mother    Diabetes Father    Diabetes Sister     Social History Social History   Tobacco Use   Smoking status: Former    Packs/day: 1.00    Years: 5.00    Additional pack years: 0.00    Total pack years: 5.00    Types: Cigarettes    Quit date: 10/13/2011    Years since quitting: 11.5   Smokeless tobacco: Never   Tobacco comments:    03-24-19 per pt he stopped 1 mo ago   Vaping Use   Vaping Use: Never used  Substance Use Topics   Alcohol use: Not Currently    Alcohol/week: 1.0 standard drink of alcohol    Types: 1 Cans of beer per week   Drug use: Never   Allergies Macrobid [nitrofurantoin]  Review of Systems Review of Systems  All other systems reviewed and are negative.   Physical Exam Vital Signs  I have reviewed the triage vital signs BP 131/74   Pulse 97   Temp (!) 101.5 F (38.6 C) (Rectal)   Resp 18   Ht 5\' 4"  (1.626 m)   Wt 87 kg   SpO2 100%   BMI 32.92 kg/m  Physical Exam Vitals and nursing note reviewed.  Constitutional:      General: He is not in acute distress.    Appearance: Normal appearance.  HENT:     Mouth/Throat:     Mouth: Mucous membranes are dry.  Eyes:     Conjunctiva/sclera: Conjunctivae normal.  Cardiovascular:     Rate and Rhythm: Regular rhythm. Tachycardia present.  Pulmonary:     Effort: Pulmonary effort is normal. No respiratory distress.     Breath sounds: Normal breath sounds.  Abdominal:      General: Abdomen is flat.     Palpations: Abdomen is soft.     Tenderness: There is no abdominal tenderness.  Musculoskeletal:  Right lower leg: No edema.     Left lower leg: No edema.  Skin:    General: Skin is warm and dry.     Capillary Refill: Capillary refill takes less than 2 seconds.     Comments: Right lower buttock chronic ulcer approximately 1 x 1 inch, no surrounding erythema or purulent drainage  Neurological:     Mental Status: He is alert and oriented to person, place, and time. Mental status is at baseline.     Comments: Bilateral lower extremity paralysis  Psychiatric:        Mood and Affect: Mood normal.        Behavior: Behavior normal.     ED Results and Treatments Labs (all labs ordered are listed, but only abnormal results are displayed) Labs Reviewed  BASIC METABOLIC PANEL - Abnormal; Notable for the following components:      Result Value   Sodium 127 (*)    Potassium 3.2 (*)    CO2 15 (*)    Glucose, Bld 209 (*)    BUN 28 (*)    Creatinine, Ser 1.42 (*)    Calcium 8.0 (*)    GFR, Estimated 57 (*)    All other components within normal limits  CBC WITH DIFFERENTIAL/PLATELET - Abnormal; Notable for the following components:   WBC 13.7 (*)    RBC 3.01 (*)    Hemoglobin 8.8 (*)    HCT 26.2 (*)    Neutro Abs 10.9 (*)    Monocytes Absolute 1.4 (*)    Abs Immature Granulocytes 0.15 (*)    All other components within normal limits  URINALYSIS, W/ REFLEX TO CULTURE (INFECTION SUSPECTED) - Abnormal; Notable for the following components:   Color, Urine AMBER (*)    APPearance CLOUDY (*)    Glucose, UA 50 (*)    Protein, ur 100 (*)    Leukocytes,Ua MODERATE (*)    Bacteria, UA RARE (*)    All other components within normal limits  CULTURE, BLOOD (ROUTINE X 2)  CULTURE, BLOOD (ROUTINE X 2)  URINE CULTURE  LACTIC ACID, PLASMA  LACTIC ACID, PLASMA                                                                                                                           Radiology DG Chest Port 1 View  Result Date: 04/11/2023 CLINICAL DATA:  Cough EXAM: PORTABLE CHEST 1 VIEW COMPARISON:  Chest x-ray April 25, 2022 FINDINGS: The cardiomediastinal silhouette is unchanged in contour. No focal pulmonary opacity. No pleural effusion or pneumothorax. The visualized upper abdomen is unremarkable. Partially visualized thoracic fusion hardware. No acute osseous abnormality. IMPRESSION: No acute cardiopulmonary abnormality. Electronically Signed   By: Jacob Moores M.D.   On: 04/11/2023 14:35    Pertinent labs & imaging results that were available during my care of the patient were reviewed by me and considered in my medical decision making (see MDM for details).  Medications Ordered in  ED Medications  meropenem (MERREM) 1 g in sodium chloride 0.9 % 100 mL IVPB (0 g Intravenous Stopped 04/11/23 1501)  0.9 %  sodium chloride infusion (has no administration in time range)  acetaminophen (TYLENOL) tablet 1,000 mg (1,000 mg Oral Given 04/11/23 1359)  lactated ringers bolus 2,109 mL (2,109 mLs Intravenous New Bag/Given 04/11/23 1355)  potassium chloride (KLOR-CON) packet 20 mEq (20 mEq Oral Given 04/11/23 1500)                                                                                                                                     Procedures .Critical Care  Performed by: Lonell Grandchild, MD Authorized by: Lonell Grandchild, MD   Critical care provider statement:    Critical care time (minutes):  30   Critical care was necessary to treat or prevent imminent or life-threatening deterioration of the following conditions:  Sepsis   Critical care was time spent personally by me on the following activities:  Development of treatment plan with patient or surrogate, discussions with consultants, evaluation of patient's response to treatment, examination of patient, ordering and review of laboratory studies, ordering and review of radiographic  studies, ordering and performing treatments and interventions, pulse oximetry, re-evaluation of patient's condition and review of old charts   Care discussed with: admitting provider     (including critical care time)  Medical Decision Making / ED Course   MDM:  61 year old male presenting to the emergency department with fever, tachycardia.  Patient overall in no acute distress but does have tachycardia with fever, concern for sepsis.  Labs with acute kidney injury, leukocytosis, hyponatremia and acidosis.  Suspect likely source is urinary.  Urine culture from a few days a grew Klebsiella which is sensitive to most antibiotics as well as Morganella morganii which has not returned yet.  Given history of ESBL will cover with meropenem.  Will obtain blood cultures.  Will give sepsis bolus fluids.  Anticipate patient will likely need to be admitted as he has failed outpatient treatment.  Lower concern for other causes of infection such as pneumonia, lungs clear, doubt skin or soft tissue infection, chronic ulcer does not appear acutely infected, no abdominal tenderness, nausea or vomiting to suggest intra-abdominal process.  Will reassess.  Clinical Course as of 04/11/23 1652  Sun Apr 11, 2023  1508 Discussed with the hospitalist. They will admit patient [WS]    Clinical Course User Index [WS] Suezanne Jacquet Jerilee Field, MD     Additional history obtained: -Additional history obtained from spouse -External records from outside source obtained and reviewed including: Chart review including previous notes, labs, imaging, consultation notes including UC note from few days ago   Lab Tests: -I ordered, reviewed, and interpreted labs.   The pertinent results include:   Labs Reviewed  BASIC METABOLIC PANEL - Abnormal; Notable for the following components:      Result Value  Sodium 127 (*)    Potassium 3.2 (*)    CO2 15 (*)    Glucose, Bld 209 (*)    BUN 28 (*)    Creatinine, Ser 1.42 (*)     Calcium 8.0 (*)    GFR, Estimated 57 (*)    All other components within normal limits  CBC WITH DIFFERENTIAL/PLATELET - Abnormal; Notable for the following components:   WBC 13.7 (*)    RBC 3.01 (*)    Hemoglobin 8.8 (*)    HCT 26.2 (*)    Neutro Abs 10.9 (*)    Monocytes Absolute 1.4 (*)    Abs Immature Granulocytes 0.15 (*)    All other components within normal limits  URINALYSIS, W/ REFLEX TO CULTURE (INFECTION SUSPECTED) - Abnormal; Notable for the following components:   Color, Urine AMBER (*)    APPearance CLOUDY (*)    Glucose, UA 50 (*)    Protein, ur 100 (*)    Leukocytes,Ua MODERATE (*)    Bacteria, UA RARE (*)    All other components within normal limits  CULTURE, BLOOD (ROUTINE X 2)  CULTURE, BLOOD (ROUTINE X 2)  URINE CULTURE  LACTIC ACID, PLASMA  LACTIC ACID, PLASMA    Notable for hyponatremia, AKI, leukocytosis, acidosis, evidence of UTI   Imaging Studies ordered: I ordered imaging studies including CXR On my interpretation imaging demonstrates no pneumonia I independently visualized and interpreted imaging. I agree with the radiologist interpretation   Medicines ordered and prescription drug management: Meds ordered this encounter  Medications   DISCONTD: cefTRIAXone (ROCEPHIN) 1 g in sodium chloride 0.9 % 100 mL IVPB    Order Specific Question:   Antibiotic Indication:    Answer:   UTI   acetaminophen (TYLENOL) tablet 1,000 mg   DISCONTD: lactated ringers infusion   lactated ringers bolus 2,109 mL   DISCONTD: ertapenem (INVANZ) 1,000 mg in sodium chloride 0.9 % 100 mL IVPB    Order Specific Question:   My specialty / Organism    Answer:   ESBL organism    Order Specific Question:   Antibiotic Indication:    Answer:   ESBL Infection   meropenem (MERREM) 1 g in sodium chloride 0.9 % 100 mL IVPB    Order Specific Question:   Antibiotic Indication:    Answer:   ESBL Infection   potassium chloride (KLOR-CON) packet 20 mEq   0.9 %  sodium chloride  infusion    -I have reviewed the patients home medicines and have made adjustments as needed   Consultations Obtained: I requested consultation with the hospitalist,  and discussed lab and imaging findings as well as pertinent plan - they recommend: admission   Cardiac Monitoring: The patient was maintained on a cardiac monitor.  I personally viewed and interpreted the cardiac monitored which showed an underlying rhythm of: NSR  Social Determinants of Health:  Diagnosis or treatment significantly limited by social determinants of health: obesity   Reevaluation: After the interventions noted above, I reevaluated the patient and found that their symptoms have improved  Co morbidities that complicate the patient evaluation  Past Medical History:  Diagnosis Date   Cellulitis and abscess of buttock 10/2016   Diabetes mellitus    ESBL (extended spectrum beta-lactamase) producing bacteria infection 03/16/2022   Hyperkalemia 03/16/2022   Hypertension    Myositis 04/21/2022   Paraplegia (HCC) 2013   fell from ladder   Pressure ulcer, buttock, right, unstageable (HCC) 12/30/2020   Pyelonephritis due to Escherichia  coli 03/16/2022   Sacral decubitus ulcer, stage IV (HCC) 11/03/2021   SCI (spinal cord injury)    Sepsis due to Escherichia coli (E. coli) (HCC) 03/01/2022   Spine fracture 11/16/2011   T 11- T9-L1      Dispostion: Disposition decision including need for hospitalization was considered, and patient admitted to the hospital.    Final Clinical Impression(s) / ED Diagnoses Final diagnoses:  Sepsis, due to unspecified organism, unspecified whether acute organ dysfunction present [facility])     This chart was dictated using voice recognition software.  Despite best efforts to proofread,  errors can occur which can change the documentation meaning.    Lonell Grandchild, MD 04/11/23 [phone]

## 2023-04-11 NOTE — Progress Notes (Signed)
Dinner tray ordered.

## 2023-04-11 NOTE — Progress Notes (Signed)
Pt arrived to 6 north room 9 alert and oriented x4. Chronic wound noted on right lower buttocks. Pain level 0/10. Wife at bedside. Bed in lowest position. Call light in reach. Will continue to monitor pt.

## 2023-04-11 NOTE — ED Notes (Signed)
edh     

## 2023-04-11 NOTE — Sepsis Progress Note (Signed)
Sepsis protocol monitored by eLink ?

## 2023-04-11 NOTE — ED Notes (Signed)
.ED TO INPATIENT HANDOFF REPORT  ED Nurse Name and Phone #: [phone]   S Name/Age/Gender John Meza 62 y.o. male Room/Bed: 005C/005C  Code Status   Code Status: Full Code  Home/SNF/Other Home Patient oriented to: self, place, time, and situation Is this baseline? Yes   Triage Complete: Triage complete  Chief Complaint Sepsis [facility]) [A41.9]  Triage Note Pt c/o fever, but doesn't know what the temperature was. Pt denies any other sx.    Allergies Allergies  Allergen Reactions   Macrobid [Nitrofurantoin] Itching and Rash    Level of Care/Admitting Diagnosis ED Disposition     ED Disposition  Admit   Condition  --   Comment  Hospital Area: [facility] [[id number]]  Level of Care: Telemetry Medical [104]  May admit patient to [facility] or [facility] if equivalent level of care is available:: Yes  Covid Evaluation: Asymptomatic - no recent exposure (last 10 days) testing not required  Diagnosis: Sepsis [facility]) [[id number]]  Admitting Physician: Allie Dimmer  Attending Physician: Dierdre Searles, NA [place]  Certification:: I certify this patient will need inpatient services for at least 2 midnights  Estimated Length of Stay: 2          B Medical/Surgery History Past Medical History:  Diagnosis Date   Cellulitis and abscess of buttock 10/2016   Diabetes mellitus    ESBL (extended spectrum beta-lactamase) producing bacteria infection 03/16/2022   Hyperkalemia 03/16/2022   Hypertension    Myositis 04/21/2022   Paraplegia (HCC) 2013   fell from ladder   Pressure ulcer, buttock, right, unstageable (HCC) 12/30/2020   Pyelonephritis due to Escherichia coli 03/16/2022   Sacral decubitus ulcer, stage IV (HCC) 11/03/2021   SCI (spinal cord injury)    Sepsis due to Escherichia coli (E. coli) (HCC) 03/01/2022   Spine fracture 11/16/2011   T 11- T9-L1   Past Surgical History:  Procedure Laterality Date   CHOLECYSTECTOMY N/A 05/01/2015   Procedure: LAPAROSCOPIC  CHOLECYSTECTOMY WITH ATTEMPTED INTRAOPERATIVE CHOLANGIOGRAM;  Surgeon: Violeta Gelinas, MD;  Location: [facility] OR;  Service: General;  Laterality: N/A;   SPINE SURGERY     TEE WITHOUT CARDIOVERSION N/A 03/26/2022   Procedure: TRANSESOPHAGEAL ECHOCARDIOGRAM (TEE);  Surgeon: [name], MD;  Location[facility] ENDOSCOPY;  Service: Cardiovascular;  Laterality: N/A;     A IV Location/Drains/Wounds Patient Lines/Drains/Airways Status     Active Line/Drains/Airways     Name Placement date Placement time Site Days   Peripheral IV 04/11/23 20 G Anterior;Right Forearm 04/11/23  1355  Forearm  less than 1   Peripheral IV 04/11/23 18 G Anterior;Left;Proximal Forearm 04/11/23  1359  Forearm  less than 1            Intake/Output Last 24 hours No intake or output data in the 24 hours ending 04/11/23 1711  Labs/Imaging Results for orders placed or performed during the hospital encounter of 04/11/23 (from the past 48 hour(s))  Basic metabolic panel     Status: Abnormal   Collection Time: 04/11/23  1:01 PM  Result Value Ref Range   Sodium 127 (L) 135 - 145 mmol/L   Potassium 3.2 (L) 3.5 - 5.1 mmol/L   Chloride 98 98 - 111 mmol/L   CO2 15 (L) 22 - 32 mmol/L   Glucose, Bld 209 (H) 70 - 99 mg/dL    Comment: Glucose reference range applies only to samples taken after fasting for at least 8 hours.   BUN 28 (H) 6 - 20 mg/dL  Creatinine, Ser 1.42 (H) 0.61 - 1.24 mg/dL   Calcium 8.0 (L) 8.9 - 10.3 mg/dL   GFR, Estimated 57 (L) >60 mL/min    Comment: (NOTE) Calculated using the CKD-EPI Creatinine Equation (2021)    Anion gap 14 5 - 15    Comment: Performed at [facility], [address] [place], [place] [address]  CBC with Differential     Status: Abnormal   Collection Time: 04/11/23  1:01 PM  Result Value Ref Range   WBC 13.7 (H) 4.0 - 10.5 K/uL   RBC 3.01 (L) 4.22 - 5.81 MIL/uL   Hemoglobin 8.8 (L) 13.0 - 17.0 g/dL   HCT 60.4 (L) 54.0 - 98.1 %   MCV 87.0 80.0 - 100.0 fL   MCH 29.2 26.0  - 34.0 pg   MCHC 33.6 30.0 - 36.0 g/dL   RDW 19.1 47.8 - 29.5 %   Platelets 323 150 - 400 K/uL   nRBC 0.0 0.0 - 0.2 %   Neutrophils Relative % 80 %   Neutro Abs 10.9 (H) 1.7 - 7.7 K/uL   Lymphocytes Relative 8 %   Lymphs Abs 1.1 0.7 - 4.0 K/uL   Monocytes Relative 11 %   Monocytes Absolute 1.4 (H) 0.1 - 1.0 K/uL   Eosinophils Relative 0 %   Eosinophils Absolute 0.0 0.0 - 0.5 K/uL   Basophils Relative 0 %   Basophils Absolute 0.0 0.0 - 0.1 K/uL   Immature Granulocytes 1 %   Abs Immature Granulocytes 0.15 (H) 0.00 - 0.07 K/uL    Comment: Performed at [facility], [address] [place], [place] [address]  Urinalysis, w/ Reflex to Culture (Infection Suspected) -Urine, Catheterized     Status: Abnormal   Collection Time: 04/11/23  2:00 PM  Result Value Ref Range   Specimen Source URINE, CATHETERIZED    Color, Urine AMBER (A) YELLOW    Comment: BIOCHEMICALS MAY BE AFFECTED BY COLOR   APPearance CLOUDY (A) CLEAR   Specific Gravity, Urine 1.015 1.005 - 1.030   pH 5.0 5.0 - 8.0   Glucose, UA 50 (A) NEGATIVE mg/dL   Hgb urine dipstick NEGATIVE NEGATIVE   Bilirubin Urine NEGATIVE NEGATIVE   Ketones, ur NEGATIVE NEGATIVE mg/dL   Protein, ur 865 (A) NEGATIVE mg/dL   Nitrite NEGATIVE NEGATIVE   Leukocytes,Ua MODERATE (A) NEGATIVE   RBC / HPF 0-5 0 - 5 RBC/hpf   WBC, UA 21-50 0 - 5 WBC/hpf    Comment:        Reflex urine culture not performed if WBC <=10, OR if Squamous epithelial cells >5. If Squamous epithelial cells >5 suggest recollection.    Bacteria, UA RARE (A) NONE SEEN   Squamous Epithelial / HPF 0-5 0 - 5 /HPF   WBC Clumps PRESENT    Mucus PRESENT    Hyaline Casts, UA PRESENT    Amorphous Crystal PRESENT     Comment: Performed at [facility], [address], [place], [place] [address]  Lactic acid, plasma     Status: None   Collection Time: 04/11/23  2:00 PM  Result Value Ref Range   Lactic Acid, Venous 1.6 0.5 - 1.9 mmol/L    Comment: Performed at  [facility], [address] [place], [place] [address]   DG Chest Port 1 View  Result Date: 04/11/2023 CLINICAL DATA:  Cough EXAM: PORTABLE CHEST 1 VIEW COMPARISON:  Chest x-ray April 25, 2022 FINDINGS: The cardiomediastinal silhouette is  unchanged in contour. No focal pulmonary opacity. No pleural effusion or pneumothorax. The visualized upper abdomen is unremarkable. Partially visualized thoracic fusion hardware. No acute osseous abnormality. IMPRESSION: No acute cardiopulmonary abnormality. Electronically Signed   By: Jacob Moores M.D.   On: 04/11/2023 14:35    Pending Labs Unresulted Labs (From admission, onward)     Start     Ordered   04/12/23 0500  Basic metabolic panel  Daily,   R      04/11/23 1352   04/12/23 0500  CBC  Daily,   R      04/11/23 1352   04/12/23 0500  Hemoglobin A1c  Tomorrow morning,   R        04/11/23 1657   04/12/23 0500  Comprehensive metabolic panel  Tomorrow morning,   R        04/11/23 1706   04/11/23 1709  C Difficile Quick Screen w PCR reflex  (C Difficile quick screen w PCR reflex panel )  Once, for 24 hours,   TIMED       References:    CDiff Information Tool   04/11/23 1708   04/11/23 1706  HIV Antibody (routine testing w rflx)  (HIV Antibody (Routine testing w reflex) panel)  Once,   R        04/11/23 1706   04/11/23 1400  Urine Culture  Once,   R        04/11/23 1400   04/11/23 1336  Culture, blood (routine x 2)  BLOOD CULTURE X 2,   R (with STAT occurrences)      04/11/23 1337   04/11/23 1336  Lactic acid, plasma  Now then every 2 hours,   R (with STAT occurrences)      04/11/23 1337            Vitals/Pain Today's Vitals   04/11/23 1445 04/11/23 1530 04/11/23 1600 04/11/23 1615  BP: 128/73  131/74   Pulse: (!) 107 (!) 112 (!) 103 97  Resp: (!) 28 (!) 27 (!) 24 18  Temp:      TempSrc:      SpO2: 99% 100% 100% 100%  Weight:      Height:      PainSc:        Isolation Precautions Enteric precautions (UV  disinfection)  Medications Medications  meropenem (MERREM) 1 g in sodium chloride 0.9 % 100 mL IVPB (0 g Intravenous Stopped 04/11/23 1501)  0.9 %  sodium chloride infusion (has no administration in time range)  atorvastatin (LIPITOR) tablet 20 mg (has no administration in time range)  ferrous sulfate tablet 325 mg (has no administration in time range)  methocarbamol (ROBAXIN) tablet 500 mg (has no administration in time range)  enoxaparin (LOVENOX) injection 40 mg (has no administration in time range)  acetaminophen (TYLENOL) tablet 650 mg (has no administration in time range)    Or  acetaminophen (TYLENOL) suppository 650 mg (has no administration in time range)  morphine (PF) 2 MG/ML injection 2 mg (has no administration in time range)  ondansetron (ZOFRAN) tablet 4 mg (has no administration in time range)    Or  ondansetron (ZOFRAN) injection 4 mg (has no administration in time range)  polyethylene glycol (MIRALAX / GLYCOLAX) packet 17 g (has no administration in time range)  acetaminophen (TYLENOL) tablet 1,000 mg (1,000 mg Oral Given 04/11/23 1359)  lactated ringers bolus 2,109 mL (2,109 mLs Intravenous New Bag/Given 04/11/23 1355)  potassium chloride (KLOR-CON) packet 20 mEq (20  mEq Oral Given 04/11/23 1500)    Mobility walks     Focused Assessments Cardiac Assessment Handoff:    No results found for: "CKTOTAL", "CKMB", "CKMBINDEX", "TROPONINI" No results found for: "DDIMER" Does the Patient currently have chest pain? No   , Neuro Assessment Handoff:  Swallow screen pass? Yes          Neuro Assessment: Within Defined Limits Neuro Checks:      Has TPA been given? No If patient is a Neuro Trauma and patient is going to OR before floor call report to 4N Charge nurse: [phone] or [phone]   R Recommendations: See Admitting Provider Note  Report given to:   Additional Notes: Aox4, spanish speaking, denies any pain.

## 2023-04-11 NOTE — Progress Notes (Signed)
Pharmacy Antibiotic Note  John Meza is a 62 y.o. male admitted on 04/11/2023 with sepsis with recent ESBL E coli bacteremia.  Pharmacy has been consulted for meropenem dosing.  WBC 13.7, Tmax 100.1 Scr 1.42 (baseline Scr ~0.7-0.8  Plan: Initiate meropenem 1g IV q8h  Monitor daily CBC, temp, SCr, and for clinical signs of improvement  F/u cultures and de-escalate antibiotics as able   Height: 5\' 4"  (162.6 cm) Weight: 87 kg (191 lb 12.8 oz) IBW/kg (Calculated) : 59.2  Temp (24hrs), Avg:100.1 F (37.8 C), Min:100.1 F (37.8 C), Max:100.1 F (37.8 C)  Recent Labs  Lab 04/09/23 0932 04/11/23 1301  WBC 11.2* 13.7*  CREATININE 1.16 1.42*    Estimated Creatinine Clearance: 55.7 mL/min (A) (by C-G formula based on SCr of 1.42 mg/dL (H)).    Allergies  Allergen Reactions   Macrobid [Nitrofurantoin] Itching and Rash    Antimicrobials this admission: Meropenem 6/30 >>   Dose adjustments this admission: N/A  Microbiology results: 6/30 BCx: sent  Recent microbiology results prior to admission: 6/28 Ur cx: >100K Klebsiella pneumoniae (R: ampicillin, I: ampicillin/sulbactam, S: everything else), AND >100K Morganella morganii - sensitivities pending 6/8 Bld cx x2: 3/4 ESBL E coli 6/8 Ur cx: >100K yeast   Thank you for allowing pharmacy to be a part of this patient's care.  Wilburn Cornelia, PharmD, BCPS Clinical Pharmacist 04/11/2023 1:58 PM   Please refer to [facility] for pharmacy phone number

## 2023-04-12 DIAGNOSIS — Z8619 Personal history of other infectious and parasitic diseases: Secondary | ICD-10-CM

## 2023-04-12 DIAGNOSIS — R197 Diarrhea, unspecified: Secondary | ICD-10-CM

## 2023-04-12 DIAGNOSIS — E1165 Type 2 diabetes mellitus with hyperglycemia: Secondary | ICD-10-CM

## 2023-04-12 LAB — COMPREHENSIVE METABOLIC PANEL
ALT: 22 U/L (ref 0–44)
AST: 22 U/L (ref 15–41)
Albumin: 1.8 g/dL — ABNORMAL LOW (ref 3.5–5.0)
Alkaline Phosphatase: 94 U/L (ref 38–126)
Anion gap: 9 (ref 5–15)
BUN: 22 mg/dL — ABNORMAL HIGH (ref 6–20)
CO2: 18 mmol/L — ABNORMAL LOW (ref 22–32)
Calcium: 7.9 mg/dL — ABNORMAL LOW (ref 8.9–10.3)
Chloride: 103 mmol/L (ref 98–111)
Creatinine, Ser: 1.06 mg/dL (ref 0.61–1.24)
GFR, Estimated: >60 mL/min (ref >60)
Glucose, Bld: 154 mg/dL — ABNORMAL HIGH (ref 70–99)
Potassium: 3.1 mmol/L — ABNORMAL LOW (ref 3.5–5.1)
Sodium: 130 mmol/L — ABNORMAL LOW (ref 135–145)
Total Bilirubin: 0.5 mg/dL (ref 0.3–1.2)
Total Protein: 5.6 g/dL — ABNORMAL LOW (ref 6.5–8.1)

## 2023-04-12 LAB — URINE CULTURE: Culture: NO GROWTH

## 2023-04-12 LAB — C DIFFICILE QUICK SCREEN W PCR REFLEX
C Diff antigen: NEGATIVE
C Diff interpretation: NOT DETECTED
C Diff toxin: NEGATIVE

## 2023-04-12 LAB — CBC
HCT: 23.6 % — ABNORMAL LOW (ref 39.0–52.0)
Hemoglobin: 7.8 g/dL — ABNORMAL LOW (ref 13.0–17.0)
MCH: 28.7 pg (ref 26.0–34.0)
MCHC: 33.1 g/dL (ref 30.0–36.0)
MCV: 86.8 fL (ref 80.0–100.0)
Platelets: 293 10*3/uL (ref 150–400)
RBC: 2.72 MIL/uL — ABNORMAL LOW (ref 4.22–5.81)
RDW: 14.6 % (ref 11.5–15.5)
WBC: 11 10*3/uL — ABNORMAL HIGH (ref 4.0–10.5)
nRBC: 0 % (ref 0.0–0.2)

## 2023-04-12 LAB — GLUCOSE, CAPILLARY
Glucose-Capillary: 148 mg/dL — ABNORMAL HIGH (ref 70–99)
Glucose-Capillary: 222 mg/dL — ABNORMAL HIGH (ref 70–99)
Glucose-Capillary: 194 mg/dL — ABNORMAL HIGH (ref 70–99)
Glucose-Capillary: 204 mg/dL — ABNORMAL HIGH (ref 70–99)

## 2023-04-12 LAB — CULTURE, BLOOD (ROUTINE X 2): Culture: NO GROWTH

## 2023-04-12 MED ORDER — INSULIN ASPART 100 UNIT/ML IJ SOLN
0.0000 [IU] | Freq: Every day | INTRAMUSCULAR | Status: DC
  Administered 2023-04-12 – 2023-04-13 (×2): 2 [IU] via SUBCUTANEOUS

## 2023-04-12 MED ORDER — MEDIHONEY WOUND/BURN DRESSING EX PSTE
1.0000 | PASTE | Freq: Every day | CUTANEOUS | Status: DC
  Administered 2023-04-13 – 2023-04-14 (×2): 1 via TOPICAL
  Filled 2023-04-12 (×3): qty 44

## 2023-04-12 MED ORDER — POTASSIUM CHLORIDE 20 MEQ PO PACK
40.0000 meq | PACK | Freq: Two times a day (BID) | ORAL | Status: DC
  Administered 2023-04-12 – 2023-04-14 (×5): 40 meq via ORAL
  Filled 2023-04-12 (×5): qty 2

## 2023-04-12 MED ORDER — SODIUM CHLORIDE 0.9 % IV BOLUS
500.0000 mL | Freq: Once | INTRAVENOUS | Status: AC
  Administered 2023-04-12: 500 mL via INTRAVENOUS

## 2023-04-12 MED ORDER — SODIUM CHLORIDE 0.9 % IV SOLN: INTRAVENOUS | Status: AC

## 2023-04-12 MED ORDER — SODIUM CHLORIDE 0.9 % IV BOLUS
1000.0000 mL | Freq: Once | INTRAVENOUS | Status: AC
  Administered 2023-04-12: 1000 mL via INTRAVENOUS

## 2023-04-12 MED ORDER — INSULIN ASPART 100 UNIT/ML IJ SOLN
0.0000 [IU] | Freq: Three times a day (TID) | INTRAMUSCULAR | Status: DC
  Administered 2023-04-13 – 2023-04-14 (×4): 2 [IU] via SUBCUTANEOUS
  Administered 2023-04-14 (×2): 3 [IU] via SUBCUTANEOUS

## 2023-04-12 NOTE — Progress Notes (Signed)
PROGRESS NOTE    John Meza  ZOX:[id number] DOB: [birth year] DOA: 04/11/2023 PCP: Storm Frisk, MD   Brief Narrative:  Patient is a 62 year old chronically ill-appearing Hispanic male with a past medical history significant for but #2 paraplegia, neurogenic bladder who self caths 3 times at home daily, recurrent UTIs with history of ESBL UTI, diabetes mellitus type 2, hypertension, chronic right buttock pressure ulcer as follows former smoker who presented with fevers.  He reported having intermittent fevers and chills the last 5 days and symptoms included decreased appetite and generalized weakness.  He is paraplegic with his neurogenic bladder and wife helps him self cath but she noted that he had some cloudy urine over the last 5 days.  He came to the ED on 04/01/2023 with fevers and was suspected to have UTI and was given IV ceftriaxone and discharged on p.o. ciprofloxacin at home.  He is compliant with the Cipro at that time however he continued have fevers and also started having some watery diarrhea.  Given his persistent symptoms he came to the ED and was found to be septic and is currently being admitted with a working diagnosis of sepsis secondary to complicated urinary tract infection with history of ESBL UTI in the setting of a patient with neurogenic bladder.  Other workup is initiated and he is getting IV fluid resuscitation.  Blood cultures x 2 repeated today given his recurrent fevers.  Assessment and Plan:  Secondary to complicated urinary tract infection with a history of ESBL UTI and history of neurogenic bladder with self cath 3 times daily -Patient met sepsis criteria on admission and etiology is likely related to recurrent UTI -Patient was given ciprofloxacin 5 mg p.o. twice daily for 7 days for UTI during an ED visit on 04/01/2023 -Urine culture at that time grew pansensitive Klebsiella and given his history of ESBL infection he has not been initiated on  meropenem with urine culture pending -The patient is still septic and spiking temperatures will give 1 L fluid bolus of 1.5 L and follow-up on the blood and urine cultures -WBC and Lactic Acid Level Trend: Recent Labs  Lab 03/17/23 1107 04/09/23 0932 04/11/23 1301 04/11/23 1400 04/11/23 1839 04/12/23 0430  WBC 7.2 11.2* 13.7*  --   --  11.0*  LATICACIDVEN  --   --   --    < > 1.4  --    < > = values in this interval not displayed.  -Urinalysis done and showed a cloudy appearance with amber color urine, 50 hemoglobin, moderate leukocytes, negative nitrites, rare bacteria, 0-5 RBCs per high-power field, 0-5 squamous epithelial cells and 21-50 WBCs -Cultures x 2 given the patient spiked very high temperatures -Chest x-ray done and showed no acute cardiopulmonary disease -Urine culture showed no growth -Patient has been having diarrhea and C. difficile been negative but will check a GI pathogen panel -Continue with IV fluid hydration with normal saline at 75 MLS per hour  Diabetes Mellitus Type 2 -Last hemoglobin A1c was 6.5 back in January For repeat here Can continue carb modified diet -Hold oral diabetic medications and placed on sensitive NovoLog/scale insulin AC -Continue monitor CBGs per protocol and CBG and glucose trends showing: Recent Labs  Lab 04/09/23 0932 04/11/23 1301 04/12/23 0430  GLUCOSE 181* 209* 154*   Recent Labs  Lab 04/11/23 1815 04/11/23 2021 04/12/23 0757 04/12/23 1233 04/12/23 1730  GLUCAP 175* 187* 148* 222* 194*  -Diabetes Education Coordinator consulted for further evaluation and Recommendations  Paraplegia -Continue with muscle relaxants and supportive care; patient self caths at home  Hyperlipidemia -Continue with atorvastatin 20 mg p.o. daily  AKI -BUN/Cr Trend: Recent Labs  Lab 04/09/23 0932 04/11/23 1301 04/12/23 0430  BUN 27* 28* 22*  CREATININE 1.16 1.42* 1.06  -C/w IVF hydration -Avoid Nephrotoxic Medications, Contrast Dyes,  Hypotension and Dehydration to Ensure Adequate Renal Perfusion and will need to Renally Adjust Meds -Continue to Monitor and Trend Renal Function carefully and repeat CMP in the AM   Hyponatremia -Na+ Trend: Recent Labs  Lab 04/09/23 0932 04/11/23 1301 04/12/23 0430  NA 129* 127* 130*  -C/w IVF with NS at 75 mL/hr -Continue to Monitor and Trend and repeat CMP in the AM  Metabolic Acidosis -Patient has a CO2 of 18, Chloride Level Level of 103, AG of 9 -Continue IVF Hydration -Continue to Monitor and trend and Repeat CMP in the AM  Normocytic Anemia/chronic iron deficiency anemia -Hgb/Hct Trend: Recent Labs  Lab 03/17/23 1107 04/09/23 0932 04/11/23 1301 04/12/23 0430  HGB 10.8* 9.8* 8.8* 7.8*  HCT 33.3*  33.9* 29.8* 26.2* 23.6*  MCV 86.5 85.1 87.0 86.8  -Check Anemia Panel in the AM -Continue with ferrous sulfate 325 mg p.o. daily -Continue to Monitor for S/Sx of Bleeding; No overt bleeding noted -Repeat CBC in the AM.  Follow-up with Hematology outpatient setting  Hypokalemia -Patient's K+ Level Trend: Recent Labs  Lab 04/09/23 0932 04/11/23 1301 04/12/23 0430  K 3.8 3.2* 3.1*  -Replete with po Kcl 40 mEQ BID x2 -Continue to Monitor and Replete as Necessary -Repeat CMP in the AM   Hypoalbuminemia -Patient's Albumin Trend: Recent Labs  Lab 04/09/23 0932 04/12/23 0430  ALBUMIN 2.4* 1.8*  -Continue to Monitor and Trend and repeat CMP in the   Chronic Right Buttock pressure ulcer, poA Pressure Injury 04/11/23 Buttocks Right Unstageable - Full thickness tissue loss in which the base of the injury is covered by slough (yellow, tan, gray, green or brown) and/or eschar (tan, brown or black) in the wound bed. (Active)  04/11/23 1814  Location: Buttocks  Location Orientation: Right  Staging: Unstageable - Full thickness tissue loss in which the base of the injury is covered by slough (yellow, tan, gray, green or brown) and/or eschar (tan, brown or black) in the  wound bed.  Wound Description (Comments):   Present on Admission: Yes  -WOC Nurse Consult and will continue the Medihoney  Obesity -Complicates overall prognosis and care -Estimated body mass index is 32.92 kg/m as calculated from the following:   Height as of this encounter: 5\' 4"  (1.626 m).   Weight as of this encounter: 87 kg.  -Weight Loss and Dietary Counseling given   DVT prophylaxis: enoxaparin (LOVENOX) injection 40 mg Start: 04/11/23 1800    Code Status: Full Code Family Communication: Discussed with the wife at bedside  Disposition Plan:  Level of care: Telemetry Medical Status is: Inpatient Remains inpatient appropriate because: Continues spike temperatures remained septic so we will need to ensure that he is afebrile 24 hours prior to discharging and will need to have further workup and cultures   Consultants:  None  Procedures:  As delineated as above  Antimicrobials:  Anti-infectives (From admission, onward)    Start     Dose/Rate Route Frequency Ordered Stop   04/11/23 1400  meropenem (MERREM) 1 g in sodium chloride 0.9 % 100 mL IVPB        1 g 200 mL/hr over 30 Minutes Intravenous Every 8 hours  04/11/23 1352     04/11/23 1345  cefTRIAXone (ROCEPHIN) 1 g in sodium chloride 0.9 % 100 mL IVPB  Status:  Discontinued        1 g 200 mL/hr over 30 Minutes Intravenous  Once 04/11/23 1337 04/11/23 1344   04/11/23 1345  ertapenem (INVANZ) 1,000 mg in sodium chloride 0.9 % 100 mL IVPB  Status:  Discontinued        1 g 200 mL/hr over 30 Minutes Intravenous  Once 04/11/23 1344 04/11/23 1350       Subjective: Seen and examined at bedside.  The assistance of the Spanish translator Jari Favre #[id number] patient is feeling okay.  Rectal temperature this morning.  No nausea or vomiting.  Still having some watery diarrhea and had 3 episodes this morning.  No other concerns or complaints at this time  Objective: Vitals:   04/12/23 1009 04/12/23 1236 04/12/23 1445 04/12/23 1844   BP: 136/72 118/66 130/63 (!) 154/74  Pulse: (!) 113 97 95 (!) 114  Resp: 18  16 18   Temp: 98.2 F (36.8 C) 98.2 F (36.8 C) 98.6 F (37 C) (!) 101.5 F (38.6 C)  TempSrc: Oral Oral Oral Oral  SpO2: 94% 100% 100% 99%  Weight:      Height:        Intake/Output Summary (Last 24 hours) at 04/12/2023 1926 Last data filed at 04/12/2023 1538 Gross per 24 hour  Intake 1634.34 ml  Output --  Net 1634.34 ml   Filed Weights   04/11/23 1252  Weight: 87 kg   Examination: Physical Exam:  Constitutional: WN/WD obese chronically appearing Hispanic paraplegic male who is resting Respiratory: Diminished to auscultation bilaterally, no wheezing, rales, rhonchi or crackles. Normal respiratory effort and patient is not tachypenic. No accessory muscle use.  Unlabored breathing Cardiovascular: RRR, no murmurs / rubs / gallops. S1 and S2 auscultated.  Abdomen: Soft, non-tender, distended secondary to body habitus. Bowel sounds positive.  GU: Deferred. Musculoskeletal: Is a paraplegic Skin: No rashes, lesions, ulcers on limited skin evaluation but does have a buttock ulcer. No induration; Warm and dry.  Neurologic: CN 2-12 grossly intact with no focal deficits. Romberg sign and cerebellar reflexes not assessed.  Psychiatric: Normal judgment and insight. Alert and oriented x 3. Normal mood and appropriate affect.   Data Reviewed: I have personally reviewed following labs and imaging studies  CBC: Recent Labs  Lab 04/09/23 0932 04/11/23 1301 04/12/23 0430  WBC 11.2* 13.7* 11.0*  NEUTROABS 8.5* 10.9*  --   HGB 9.8* 8.8* 7.8*  HCT 29.8* 26.2* 23.6*  MCV 85.1 87.0 86.8  PLT 304 323 293   Basic Metabolic Panel: Recent Labs  Lab 04/09/23 0932 04/11/23 1301 04/12/23 0430  NA 129* 127* 130*  K 3.8 3.2* 3.1*  CL 101 98 103  CO2 18* 15* 18*  GLUCOSE 181* 209* 154*  BUN 27* 28* 22*  CREATININE 1.16 1.42* 1.06  CALCIUM 8.4* 8.0* 7.9*   GFR: Estimated Creatinine Clearance: 74.6 mL/min (by  C-G formula based on SCr of 1.06 mg/dL). Liver Function Tests: Recent Labs  Lab 04/09/23 0932 04/12/23 0430  AST 17 22  ALT 20 22  ALKPHOS 99 94  BILITOT 0.7 0.5  PROT 7.0 5.6*  ALBUMIN 2.4* 1.8*   No results for input(s): "LIPASE", "AMYLASE" in the last 168 hours. No results for input(s): "AMMONIA" in the last 168 hours. Coagulation Profile: No results for input(s): "INR", "PROTIME" in the last 168 hours. Cardiac Enzymes: No results for input(s): "  CKTOTAL", "CKMB", "CKMBINDEX", "TROPONINI" in the last 168 hours. BNP (last 3 results) No results for input(s): "PROBNP" in the last 8760 hours. HbA1C: No results for input(s): "HGBA1C" in the last 72 hours. CBG: Recent Labs  Lab 04/11/23 1815 04/11/23 2021 04/12/23 0757 04/12/23 1233 04/12/23 1730  GLUCAP 175* 187* 148* 222* 194*   Lipid Profile: No results for input(s): "CHOL", "HDL", "LDLCALC", "TRIG", "CHOLHDL", "LDLDIRECT" in the last 72 hours. Thyroid Function Tests: No results for input(s): "TSH", "T4TOTAL", "FREET4", "T3FREE", "THYROIDAB" in the last 72 hours. Anemia Panel: No results for input(s): "VITAMINB12", "FOLATE", "FERRITIN", "TIBC", "IRON", "RETICCTPCT" in the last 72 hours. Sepsis Labs: Recent Labs  Lab 04/11/23 1400 04/11/23 1839  LATICACIDVEN 1.6 1.4    Recent Results (from the past 240 hour(s))  SARS CORONAVIRUS 2 (TAT 6-24 HRS) Anterior Nasal Swab     Status: None   Collection Time: 04/09/23  9:35 AM   Specimen: Anterior Nasal Swab  Result Value Ref Range Status   SARS Coronavirus 2 NEGATIVE NEGATIVE Final    Comment: (NOTE) SARS-CoV-2 target nucleic acids are NOT DETECTED.  The SARS-CoV-2 RNA is generally detectable in upper and lower respiratory specimens during the acute phase of infection. Negative results do not preclude SARS-CoV-2 infection, do not rule out co-infections with other pathogens, and should not be used as the sole basis for treatment or other patient management  decisions. Negative results must be combined with clinical observations, patient history, and epidemiological information. The expected result is Negative.  Fact Sheet for Patients: HairSlick.no  Fact Sheet for Healthcare Providers: quierodirigir.com  This test is not yet approved or cleared by the [place] FDA and  has been authorized for detection and/or diagnosis of SARS-CoV-2 by FDA under an Emergency Use Authorization (EUA). This EUA will remain  in effect (meaning this test can be used) for the duration of the COVID-19 declaration under Se ction 564(b)(1) of the Act, 21 U.S.C. section 360bbb-3(b)(1), unless the authorization is terminated or revoked sooner.  Performed at [facility], [address], [place], [place] [address]   Urine Culture     Status: Abnormal (Preliminary result)   Collection Time: 04/09/23  9:53 AM   Specimen: Urine, Clean Catch  Result Value Ref Range Status   Specimen Description URINE, CLEAN CATCH  Final   Special Requests Normal  Final   Culture (A)  Final    >=100,000 COLONIES/mL KLEBSIELLA PNEUMONIAE >=100,000 COLONIES/mL MORGANELLA MORGANII MIC REPEAT (VITEK) Performed at [facility], [address], [place], [place] [address]    Report Status PENDING  Incomplete   Organism ID, Bacteria KLEBSIELLA PNEUMONIAE (A)  Final      Susceptibility   Klebsiella pneumoniae - MIC*    AMPICILLIN >=32 RESISTANT Resistant     CEFAZOLIN <=4 SENSITIVE Sensitive     CEFEPIME <=0.12 SENSITIVE Sensitive     CEFTRIAXONE <=0.25 SENSITIVE Sensitive     CIPROFLOXACIN <=0.25 SENSITIVE Sensitive     GENTAMICIN <=1 SENSITIVE Sensitive     IMIPENEM <=0.25 SENSITIVE Sensitive     NITROFURANTOIN 32 SENSITIVE Sensitive     TRIMETH/SULFA <=20 SENSITIVE Sensitive     AMPICILLIN/SULBACTAM 16 INTERMEDIATE Intermediate     PIP/TAZO <=4 SENSITIVE Sensitive     * >=100,000 COLONIES/mL KLEBSIELLA  PNEUMONIAE  Culture, blood (routine x 2)     Status: None (Preliminary result)   Collection Time: 04/11/23  1:41 PM   Specimen: BLOOD  Result Value Ref Range Status   Specimen  Description BLOOD SITE NOT SPECIFIED  Final   Special Requests   Final    BOTTLES DRAWN AEROBIC AND ANAEROBIC Blood Culture adequate volume   Culture   Final    NO GROWTH < 24 HOURS Performed at [facility], [address], [place], [place] [address]    Report Status PENDING  Incomplete  Urine Culture     Status: None   Collection Time: 04/11/23  2:00 PM   Specimen: Urine, Random  Result Value Ref Range Status   Specimen Description URINE, RANDOM  Final   Special Requests NONE Reflexed from [id number]  Final   Culture   Final    NO GROWTH Performed at [facility], [address], [place], [place] [address]    Report Status 04/12/2023 FINAL  Final  C Difficile Quick Screen w PCR reflex     Status: None   Collection Time: 04/12/23  2:58 PM   Specimen: STOOL  Result Value Ref Range Status   C Diff antigen NEGATIVE NEGATIVE Final   C Diff toxin NEGATIVE NEGATIVE Final   C Diff interpretation No C. difficile detected.  Final    Comment: Performed at [facility], [address] [place], [place] [address]    Radiology Studies: DG Chest Port 1 View  Result Date: 04/11/2023 CLINICAL DATA:  Cough EXAM: PORTABLE CHEST 1 VIEW COMPARISON:  Chest x-ray April 25, 2022 FINDINGS: The cardiomediastinal silhouette is unchanged in contour. No focal pulmonary opacity. No pleural effusion or pneumothorax. The visualized upper abdomen is unremarkable. Partially visualized thoracic fusion hardware. No acute osseous abnormality. IMPRESSION: No acute cardiopulmonary abnormality. Electronically Signed   By: Jacob Moores M.D.   On: 04/11/2023 14:35    Scheduled Meds:  atorvastatin  20 mg Oral Q1200   enoxaparin (LOVENOX) injection  40 mg Subcutaneous Q24H   ferrous sulfate  325 mg Oral Daily   leptospermum  manuka honey  1 Application Topical Daily   potassium chloride  40 mEq Oral BID   Continuous Infusions:  sodium chloride     meropenem (MERREM) IV 1 g (04/12/23 1442)    LOS: 1 day   Marguerita Merles, DO [facility] Available via Epic secure chat 7am-7pm After these hours, please refer to coverage provider listed on [email or web address] 04/12/2023, 7:26 PM

## 2023-04-12 NOTE — Progress Notes (Signed)
   04/12/23 0856  Assess: MEWS Score  Temp (!) 102 F (38.9 C)  Assess: MEWS Score  MEWS Temp 2  MEWS Systolic 0  MEWS Pulse 1  MEWS RR 0  MEWS LOC 0  MEWS Score 3  MEWS Score Color Yellow  Assess: if the MEWS score is Yellow or Red  Were vital signs taken at a resting state? Yes  Focused Assessment Change from prior assessment (see assessment flowsheet)  Does the patient meet 2 or more of the SIRS criteria? Yes  Does the patient have a confirmed or suspected source of infection? Yes  MEWS guidelines implemented  Yes, yellow  Treat  MEWS Interventions Considered administering scheduled or prn medications/treatments as ordered  Take Vital Signs  Increase Vital Sign Frequency  Yellow: Q2hr x1, continue Q4hrs until patient remains green for 12hrs  Escalate  MEWS: Escalate Yellow: Discuss with charge nurse and consider notifying provider and/or RRT  Notify: Charge Nurse/RN  Name of Charge Nurse/RN Notified Quarry manager  Provider Notification  Provider Name/Title Dr. Marland Mcalpine  Date Provider Notified 04/12/23  Time Provider Notified 1013  Method of Notification Page  Notification Reason Change in status  Assess: SIRS CRITERIA  SIRS Temperature  1  SIRS Pulse 1  SIRS Respirations  0  SIRS WBC 0  SIRS Score Sum  2   Pt is alert and orient, wife at bedside. Asymptomatic with the elevated HR and temp at this time.

## 2023-04-12 NOTE — Progress Notes (Signed)
   04/12/23 1844  Assess: MEWS Score  Temp (!) 101.5 F (38.6 C)  BP (!) 154/74  MAP (mmHg) 94  Pulse Rate (!) 114  Resp 18  Level of Consciousness Alert  SpO2 99 %  O2 Device Room Air  Assess: MEWS Score  MEWS Temp 2  MEWS Systolic 0  MEWS Pulse 2  MEWS RR 0  MEWS LOC 0  MEWS Score 4  MEWS Score Color Red  Assess: if the MEWS score is Yellow or Red  Were vital signs taken at a resting state? Yes  Focused Assessment No change from prior assessment  Does the patient meet 2 or more of the SIRS criteria? Yes  Does the patient have a confirmed or suspected source of infection? Yes  MEWS guidelines implemented  Yes, red  Treat  MEWS Interventions Considered administering scheduled or prn medications/treatments as ordered  Take Vital Signs  Increase Vital Sign Frequency  Red: Q1hr x2, continue Q4hrs until patient remains green for 12hrs  Escalate  MEWS: Escalate Red: Discuss with charge nurse and notify provider. Consider notifying RRT. If remains red for 2 hours consider need for higher level of care  Notify: Charge Nurse/RN  Name of Charge Nurse/RN Notified Quarry manager  Provider Notification  Provider Name/Title Dr. Marland Mcalpine  Date Provider Notified 04/12/23  Time Provider Notified 1845  Method of Notification Page  Notification Reason Other (Comment)  Provider response See new orders  Date of Provider Response 04/12/23  Time of Provider Response 1856  Notify: Rapid Response  Name of Rapid Response RN Notified Halle RN  Date Rapid Response Notified 04/12/23  Time Rapid Response Notified 1843  Assess: SIRS CRITERIA  SIRS Temperature  1  SIRS Pulse 1  SIRS Respirations  0  SIRS WBC 0  SIRS Score Sum  2   See new orders.

## 2023-04-12 NOTE — Progress Notes (Signed)
   04/12/23 0011  Assess: MEWS Score  Temp 99.6 F (37.6 C)  BP (!) 153/71  MAP (mmHg) 92  Pulse Rate (!) 121  Resp 18  SpO2 97 %  O2 Device Room Air  Assess: MEWS Score  MEWS Temp 0  MEWS Systolic 0  MEWS Pulse 2  MEWS RR 0  MEWS LOC 0  MEWS Score 2  MEWS Score Color Yellow  Assess: if the MEWS score is Yellow or Red  Were vital signs taken at a resting state? Yes  Focused Assessment Change from prior assessment (see assessment flowsheet)  Does the patient meet 2 or more of the SIRS criteria? No  MEWS guidelines implemented  Yes, yellow  Treat  MEWS Interventions Considered administering scheduled or prn medications/treatments as ordered  Take Vital Signs  Increase Vital Sign Frequency  Yellow: Q2hr x1, continue Q4hrs until patient remains green for 12hrs  Escalate  MEWS: Escalate Yellow: Discuss with charge nurse and consider notifying provider and/or RRT  Notify: Charge Nurse/RN  Name of Charge Nurse/RN Associate Professor, RN  Provider Notification  Provider Name/Title Chinita Greenland, NP  Date Provider Notified 04/12/23  Time Provider Notified 0041  Method of Notification Page  Notification Reason Other (Comment) (Yellow MEWS)  Provider response See new orders  Date of Provider Response 04/12/23  Time of Provider Response 0042  Assess: SIRS CRITERIA  SIRS Temperature  0  SIRS Pulse 1  SIRS Respirations  0  SIRS WBC 0  SIRS Score Sum  1

## 2023-04-12 NOTE — Hospital Course (Addendum)
Patient is a 62 year old chronically ill-appearing Hispanic male with a past medical history significant for but #2 paraplegia, neurogenic bladder who self caths 3 times at home daily, recurrent UTIs with history of ESBL UTI, diabetes mellitus type 2, hypertension, chronic right buttock pressure ulcer as follows former smoker who presented with fevers.  He reported having intermittent fevers and chills the last 5 days and symptoms included decreased appetite and generalized weakness.  He is paraplegic with his neurogenic bladder and wife helps him self cath but she noted that he had some cloudy urine over the last 5 days.  He came to the ED on 04/01/2023 with fevers and was suspected to have UTI and was given IV ceftriaxone and discharged on p.o. ciprofloxacin at home.  He is compliant with the Cipro at that time however he continued have fevers and also started having some watery diarrhea.  Given his persistent symptoms he came to the ED and was found to be septic and is currently being admitted with a working diagnosis of sepsis secondary to complicated urinary tract infection with history of ESBL UTI in the setting of a patient with neurogenic bladder.  Other workup is initiated and he is getting IV fluid resuscitation.  Blood cultures x 2 repeated today given his recurrent fevers.  Assessment and Plan:  Secondary to complicated urinary tract infection with a history of ESBL UTI and history of neurogenic bladder with self cath 3 times daily -Patient met sepsis criteria on admission and etiology is likely related to recurrent UTI -Patient was given ciprofloxacin 5 mg p.o. twice daily for 7 days for UTI during an ED visit on 04/01/2023 -Urine culture at that time grew pansensitive Klebsiella and given his history of ESBL infection he has not been initiated on meropenem with urine culture pending -The patient is still septic and spiking temperatures will give 1 L fluid bolus of 1.5 L and follow-up on the blood  and urine cultures -WBC and Lactic Acid Level Trend: Recent Labs  Lab 03/17/23 1107 04/09/23 0932 04/11/23 1301 04/11/23 1400 04/11/23 1839 04/12/23 0430  WBC 7.2 11.2* 13.7*  --   --  11.0*  LATICACIDVEN  --   --   --    < > 1.4  --    < > = values in this interval not displayed.  -Urinalysis done and showed a cloudy appearance with amber color urine, 50 hemoglobin, moderate leukocytes, negative nitrites, rare bacteria, 0-5 RBCs per high-power field, 0-5 squamous epithelial cells and 21-50 WBCs -Cultures x 2 given the patient spiked very high temperatures -Chest x-ray done and showed no acute cardiopulmonary disease -Urine culture showed no growth -Patient has been having diarrhea and C. difficile been negative but will check a GI pathogen panel -Continue with IV fluid hydration with normal saline at 75 MLS per hour  Diabetes Mellitus Type 2 -Last hemoglobin A1c was 6.5 back in January For repeat here Can continue carb modified diet -Hold oral diabetic medications and placed on sensitive NovoLog/scale insulin AC -Continue monitor CBGs per protocol and CBG and glucose trends showing: Recent Labs  Lab 04/09/23 0932 04/11/23 1301 04/12/23 0430  GLUCOSE 181* 209* 154*   Recent Labs  Lab 04/11/23 1815 04/11/23 2021 04/12/23 0757 04/12/23 1233 04/12/23 1730  GLUCAP 175* 187* 148* 222* 194*  -Diabetes Education Coordinator consulted for further evaluation and Recommendations  Paraplegia -Continue with muscle relaxants and supportive care; patient self caths at home  Hyperlipidemia -Continue with atorvastatin 20 mg p.o. daily  AKI -BUN/Cr Trend: Recent Labs  Lab 04/09/23 0932 04/11/23 1301 04/12/23 0430  BUN 27* 28* 22*  CREATININE 1.16 1.42* 1.06  -C/w IVF hydration -Avoid Nephrotoxic Medications, Contrast Dyes, Hypotension and Dehydration to Ensure Adequate Renal Perfusion and will need to Renally Adjust Meds -Continue to Monitor and Trend Renal Function  carefully and repeat CMP in the AM   Hyponatremia -Na+ Trend: Recent Labs  Lab 04/09/23 0932 04/11/23 1301 04/12/23 0430  NA 129* 127* 130*  -C/w IVF with NS at 75 mL/hr -Continue to Monitor and Trend and repeat CMP in the AM  Metabolic Acidosis -Patient has a CO2 of 18, Chloride Level Level of 103, AG of 9 -Continue IVF Hydration -Continue to Monitor and trend and Repeat CMP in the AM  Normocytic Anemia/chronic iron deficiency anemia -Hgb/Hct Trend: Recent Labs  Lab 03/17/23 1107 04/09/23 0932 04/11/23 1301 04/12/23 0430  HGB 10.8* 9.8* 8.8* 7.8*  HCT 33.3*  33.9* 29.8* 26.2* 23.6*  MCV 86.5 85.1 87.0 86.8  -Check Anemia Panel in the AM -Continue with ferrous sulfate 325 mg p.o. daily -Continue to Monitor for S/Sx of Bleeding; No overt bleeding noted -Repeat CBC in the AM.  Follow-up with Hematology outpatient setting  Hypokalemia -Patient's K+ Level Trend: Recent Labs  Lab 04/09/23 0932 04/11/23 1301 04/12/23 0430  K 3.8 3.2* 3.1*  -Replete with po Kcl 40 mEQ BID x2 -Continue to Monitor and Replete as Necessary -Repeat CMP in the AM   Hypoalbuminemia -Patient's Albumin Trend: Recent Labs  Lab 04/09/23 0932 04/12/23 0430  ALBUMIN 2.4* 1.8*  -Continue to Monitor and Trend and repeat CMP in the   Chronic Right Buttock pressure ulcer, poA Pressure Injury 04/11/23 Buttocks Right Unstageable - Full thickness tissue loss in which the base of the injury is covered by slough (yellow, tan, gray, green or brown) and/or eschar (tan, brown or black) in the wound bed. (Active)  04/11/23 1814  Location: Buttocks  Location Orientation: Right  Staging: Unstageable - Full thickness tissue loss in which the base of the injury is covered by slough (yellow, tan, gray, green or brown) and/or eschar (tan, brown or black) in the wound bed.  Wound Description (Comments):   Present on Admission: Yes  -WOC Nurse Consult and will continue the Medihoney  Obesity -Complicates  overall prognosis and care -Estimated body mass index is 32.92 kg/m as calculated from the following:   Height as of this encounter: 5\' 4"  (1.626 m).   Weight as of this encounter: 87 kg.  -Weight Loss and Dietary Counseling given

## 2023-04-12 NOTE — Consult Note (Signed)
WOC Nurse Consult Note: Reason for Consult: Consult requested for right buttock.  Wound type: Chronic Unstageable pressure injury to right buttock, 3X3X.2cm, 90% yellow, 10% red, small amt yellow drainage.  It is difficult to keep the wound from becoming soiled, since Pt is frequently incontinent of loose stool.  Pressure Injury POA: Yes Dressing procedure/placement/frequency: Topical treatment orders provided for bedside nurses to perform as follows to assist with removal of nonviable tissue: Apply Medihoney to right buttock wound Q day, then cover with foam dressing.  Change foam dressing Q 3 days or PRN soiling. Please re-consult if further assistance is needed.  Thank-you,  Cammie Mcgee MSN, RN, CWOCN, [place], CNS [phone]

## 2023-04-12 NOTE — Progress Notes (Signed)
       Overnight   NAME: John Meza MRN: [id number] DOB : Nov 28, 1960    Date of Service   04/12/2023   HPI/Events of Note    Notified buy RN of CBG/ no coverage CBG 204 Ordered as noted in Attending notations   Interventions/ Plan   AC/HS sensitive scale  CBG AC /HS... as needed/indicated       Chinita Greenland BSN MSNA MSN ACNPC-AG Acute Care Nurse Practitioner [facility]

## 2023-04-12 NOTE — Inpatient Diabetes Management (Signed)
Inpatient Diabetes Program Recommendations  AACE/ADA: New Consensus Statement on Inpatient Glycemic Control (2015)  Target Ranges:  Prepandial:   less than 140 mg/dL      Peak postprandial:   less than 180 mg/dL (1-2 hours)      Critically ill patients:  140 - 180 mg/dL   Lab Results  Component Value Date   GLUCAP 222 (H) 04/12/2023   HGBA1C 6.5 (H) 10/28/2022    Review of Glycemic Control  Latest Reference Range & Units 04/11/23 18:15 04/11/23 20:21 04/12/23 07:57 04/12/23 12:33  Glucose-Capillary 70 - 99 mg/dL 161 (H) 096 (H) 045 (H) 222 (H)  (H): Data is abnormally high  Diabetes history: DM2 Outpatient Diabetes medications: Metformin 1000 mg BID, Januvia 100 mg QD Current orders for Inpatient glycemic control: CBGs  Inpatient Diabetes Program Recommendations:    Please consider;  Novolog 0-9 units TID with meals.  Will continue to follow while inpatient.  Thank you, Dulce Sellar, MSN, CDCES Diabetes Coordinator Inpatient Diabetes Program [phone] (team pager from 8a-5p)

## 2023-04-13 ENCOUNTER — Inpatient Hospital Stay: Payer: Self-pay

## 2023-04-13 ENCOUNTER — Encounter: Payer: Self-pay | Admitting: Hematology

## 2023-04-13 ENCOUNTER — Inpatient Hospital Stay (HOSPITAL_COMMUNITY): Payer: MEDICAID

## 2023-04-13 DIAGNOSIS — Z6832 Body mass index (BMI) 32.0-32.9, adult: Secondary | ICD-10-CM

## 2023-04-13 LAB — CBC WITH DIFFERENTIAL/PLATELET
Abs Immature Granulocytes: 0.16 10*3/uL — ABNORMAL HIGH (ref 0.00–0.07)
Basophils Absolute: 0.1 10*3/uL (ref 0.0–0.1)
Basophils Relative: 0 %
Eosinophils Absolute: 0 10*3/uL (ref 0.0–0.5)
Eosinophils Relative: 0 %
HCT: 26.4 % — ABNORMAL LOW (ref 39.0–52.0)
Hemoglobin: 8.8 g/dL — ABNORMAL LOW (ref 13.0–17.0)
Immature Granulocytes: 1 %
Lymphocytes Relative: 7 %
Lymphs Abs: 0.9 10*3/uL (ref 0.7–4.0)
MCH: 29 pg (ref 26.0–34.0)
MCHC: 33.3 g/dL (ref 30.0–36.0)
MCV: 87.1 fL (ref 80.0–100.0)
Monocytes Absolute: 1.3 10*3/uL — ABNORMAL HIGH (ref 0.1–1.0)
Monocytes Relative: 9 %
Neutro Abs: 11.8 10*3/uL — ABNORMAL HIGH (ref 1.7–7.7)
Neutrophils Relative %: 83 %
Platelets: 346 10*3/uL (ref 150–400)
RBC: 3.03 MIL/uL — ABNORMAL LOW (ref 4.22–5.81)
RDW: 14.8 % (ref 11.5–15.5)
WBC: 14.3 10*3/uL — ABNORMAL HIGH (ref 4.0–10.5)
nRBC: 0 % (ref 0.0–0.2)

## 2023-04-13 LAB — GLUCOSE, CAPILLARY
Glucose-Capillary: 153 mg/dL — ABNORMAL HIGH (ref 70–99)
Glucose-Capillary: 155 mg/dL — ABNORMAL HIGH (ref 70–99)
Glucose-Capillary: 200 mg/dL — ABNORMAL HIGH (ref 70–99)
Glucose-Capillary: 241 mg/dL — ABNORMAL HIGH (ref 70–99)

## 2023-04-13 LAB — COMPREHENSIVE METABOLIC PANEL
ALT: 27 U/L (ref 0–44)
AST: 26 U/L (ref 15–41)
Albumin: 1.7 g/dL — ABNORMAL LOW (ref 3.5–5.0)
Alkaline Phosphatase: 105 U/L (ref 38–126)
Anion gap: 7 (ref 5–15)
BUN: 17 mg/dL (ref 6–20)
CO2: 18 mmol/L — ABNORMAL LOW (ref 22–32)
Calcium: 7.8 mg/dL — ABNORMAL LOW (ref 8.9–10.3)
Chloride: 108 mmol/L (ref 98–111)
Creatinine, Ser: 0.84 mg/dL (ref 0.61–1.24)
GFR, Estimated: >60 mL/min (ref >60)
Glucose, Bld: 156 mg/dL — ABNORMAL HIGH (ref 70–99)
Potassium: 4 mmol/L (ref 3.5–5.1)
Sodium: 133 mmol/L — ABNORMAL LOW (ref 135–145)
Total Bilirubin: 0.5 mg/dL (ref 0.3–1.2)
Total Protein: 5.5 g/dL — ABNORMAL LOW (ref 6.5–8.1)

## 2023-04-13 LAB — HEMOGLOBIN A1C
Hgb A1c MFr Bld: 7 % — ABNORMAL HIGH (ref 4.8–5.6)
Mean Plasma Glucose: 154 mg/dL

## 2023-04-13 LAB — CULTURE, BLOOD (ROUTINE X 2): Special Requests: ADEQUATE

## 2023-04-13 LAB — URINE CULTURE: Special Requests: NORMAL

## 2023-04-13 LAB — MAGNESIUM: Magnesium: 1.3 mg/dL — ABNORMAL LOW (ref 1.7–2.4)

## 2023-04-13 LAB — PHOSPHORUS: Phosphorus: 2 mg/dL — ABNORMAL LOW (ref 2.5–4.6)

## 2023-04-13 MED ORDER — MAGNESIUM SULFATE 4 GM/100ML IV SOLN
4.0000 g | Freq: Once | INTRAVENOUS | Status: AC
  Administered 2023-04-13: 4 g via INTRAVENOUS
  Filled 2023-04-13: qty 100

## 2023-04-13 MED ORDER — SODIUM BICARBONATE 650 MG PO TABS
650.0000 mg | ORAL_TABLET | Freq: Two times a day (BID) | ORAL | Status: DC
  Administered 2023-04-13 – 2023-04-15 (×5): 650 mg via ORAL
  Filled 2023-04-13 (×5): qty 1

## 2023-04-13 MED ORDER — SODIUM PHOSPHATES 45 MMOLE/15ML IV SOLN
15.0000 mmol | Freq: Once | INTRAVENOUS | Status: AC
  Administered 2023-04-13: 15 mmol via INTRAVENOUS
  Filled 2023-04-13 (×2): qty 5

## 2023-04-13 NOTE — Evaluation (Signed)
Physical Therapy Evaluation Patient Details Name: John Meza MRN: [id number] DOB: [birth year] Today's Date: 04/13/2023  History of Present Illness  62 y.o. male adm 6/30 with medical history significant of paraplegia, neurogenic bladder with self cath 3 times daily at home, recurrent UTI with history of ESBL UTI, T2DM, HTN, chronic right buttock pressure ulcer, former smoker who presented with fevers.  Clinical Impression  Pt presents with admitting diagnosis above. PTA pt lives at home with his spouse, who provides heavy assist for ADL at bedlevel, and +2 for occasional slide board to wheelchair. Daughter reported that pt is mostly bedbound at baseline and per family pt is at baseline for mobility. Pt has no further acute PT needs and will be signing off. Re consult PT if mobility status changes.       Assistance Recommended at Discharge Frequent or constant Supervision/Assistance  If plan is discharge home, recommend the following:  Can travel by private vehicle           Equipment Recommendations None recommended by PT (Pt has needed DME)  Recommendations for Other Services       Functional Status Assessment Patient has had a recent decline in their functional status and demonstrates the ability to make significant improvements in function in a reasonable and predictable amount of time.     Precautions / Restrictions Precautions Precautions: Fall Precaution Comments: Wound to buttocks Restrictions Weight Bearing Restrictions: Yes RLE Weight Bearing: Non weight bearing LLE Weight Bearing: Non weight bearing Other Position/Activity Restrictions: Patient has no feeling or AROM to B LE's      Mobility  Bed Mobility Overal bed mobility: Needs Assistance Bed Mobility: Rolling Rolling: Max assist              Transfers                        Ambulation/Gait                  Stairs            Wheelchair Mobility     Tilt  Bed    Modified Rankin (Stroke Patients Only)       Balance                                             Pertinent Vitals/Pain Pain Assessment Pain Assessment: No/denies pain    Home Living Family/patient expects to be discharged to:: Private residence Living Arrangements: Spouse/significant other Available Help at Discharge: Family;Available 24 hours/day Type of Home: House Home Access: Ramped entrance       Home Layout: One level Home Equipment: Wheelchair - manual;Hospital bed;Other (comment) Additional Comments: Slide board    Prior Function Prior Level of Function : Needs assist             Mobility Comments: Bedbound at baseline, can slide board to w/c with heavy assist from family.  Patient might get to the wheelchiar 1/wk ADLs Comments: Spouse assists with bathing and dressing at bedlevel.  Patient is able to feed himself     Hand Dominance   Dominant Hand: Right    Extremity/Trunk Assessment   Upper Extremity Assessment Upper Extremity Assessment: Overall WFL for tasks assessed    Lower Extremity Assessment Lower Extremity Assessment: RLE deficits/detail;LLE deficits/detail RLE Deficits / Details: Previous SCI LLE Deficits / Details:  Previous SCI       Public librarian: Prefers language other than English;Interpreter utilized  Cognition Arousal/Alertness: Awake/alert Behavior During Therapy: WFL for tasks assessed/performed Overall Cognitive Status: Within Functional Limits for tasks assessed                                          General Comments      Exercises     Assessment/Plan    PT Assessment    PT Problem List         PT Treatment Interventions      PT Goals (Current goals can be found in the Care Plan section)  Acute Rehab PT Goals PT Goal Formulation: All assessment and education complete, DC therapy    Frequency       Co-evaluation PT/OT/SLP  Co-Evaluation/Treatment: Yes Reason for Co-Treatment: Complexity of the patient's impairments (multi-system involvement);For patient/therapist safety PT goals addressed during session: Mobility/safety with mobility OT goals addressed during session: ADL's and self-care       AM-PAC PT "6 Clicks" Mobility  Outcome Measure                  End of Session              Time: 1610-9604 PT Time Calculation (min) (ACUTE ONLY): 22 min   Charges:   PT Evaluation $PT Eval Moderate Complexity: 1 Mod   PT General Charges $$ ACUTE PT VISIT: 1 Visit         Shela Nevin, PT, DPT Acute Rehab Services [phone]   [name] 04/13/2023, 2:40 PM

## 2023-04-13 NOTE — Progress Notes (Deleted)
Pt arrived back to 6 north room 9 alert and oriented x4. Pain level 4/10. 4 lap sites with skin glue all clean try and intact. Bed in lowest position. Call light in reach. Will continue to monitor pt.

## 2023-04-13 NOTE — Evaluation (Signed)
Occupational Therapy Evaluation Patient Details Name: John Meza MRN: [id number] DOB: 1961-08-08 Today's Date: 04/13/2023   History of Present Illness 62 y.o. male adm 6/30 with medical history significant of paraplegia, neurogenic bladder with self cath 3 times daily at home, recurrent UTI with history of ESBL UTI, T2DM, HTN, chronic right buttock pressure ulcer, former smoker who presented with fevers.   Clinical Impression   Patient admitted for the diagnosis above.  PTA he lives at home with his spouse, who provides heavy assist for ADL at bedlevel, and +2 for occasional slide board to wheelchair.  Patient does have an existing wound to his bottom.  Per the spouse he is at his baseline for mobility and ADL completion.  Daughter was able to describe that he is mostly bedbound.  They have the needed DME at home.  Spouse pointed out some swelling to R knee, advised OT would mention it to RN.  No significant OT needs exist in the acute setting.        Recommendations for follow up therapy are one component of a multi-disciplinary discharge planning process, led by the attending physician.  Recommendations may be updated based on patient status, additional functional criteria and insurance authorization.   Assistance Recommended at Discharge Frequent or constant Supervision/Assistance  Patient can return home with the following Help with stairs or ramp for entrance;Assist for transportation;Assistance with cooking/housework;Two people to help with walking and/or transfers;A lot of help with bathing/dressing/bathroom    Functional Status Assessment  Patient has not had a recent decline in their functional status  Equipment Recommendations  None recommended by OT    Recommendations for Other Services       Precautions / Restrictions Precautions Precautions: Fall Precaution Comments: Wound to buttocks Restrictions Weight Bearing Restrictions: Yes RLE Weight Bearing: Non  weight bearing LLE Weight Bearing: Non weight bearing Other Position/Activity Restrictions: Patient has no feeling or AROM to B LE's      Mobility Bed Mobility Overal bed mobility: Needs Assistance Bed Mobility: Rolling Rolling: Max assist              Transfers                          Balance                                           ADL either performed or assessed with clinical judgement   ADL Overall ADL's : At baseline                                       General ADL Comments: Dep for lower body ADL and hygiene     Vision Patient Visual Report: No change from baseline       Perception     Praxis      Pertinent Vitals/Pain Pain Assessment Pain Assessment: No/denies pain     Hand Dominance Right   Extremity/Trunk Assessment Upper Extremity Assessment Upper Extremity Assessment: Overall WFL for tasks assessed   Lower Extremity Assessment Lower Extremity Assessment: Defer to PT evaluation       Communication Communication Communication: Prefers language other than [place];Interpreter utilized   Cognition Arousal/Alertness: Awake/alert Behavior During Therapy: WFL for tasks assessed/performed Overall Cognitive Status: Within Functional Limits  for tasks assessed                                        Home Living Family/patient expects to be discharged to:: Private residence Living Arrangements: Spouse/significant other Available Help at Discharge: Family;Available 24 hours/day Type of Home: House Home Access: Ramped entrance     Home Layout: One level     Bathroom Shower/Tub: Producer, television/film/video: Standard Bathroom Accessibility: Yes How Accessible: Accessible via wheelchair Home Equipment: Wheelchair - manual;Hospital bed;Other (comment)   Additional Comments: Slide board      Prior Functioning/Environment Prior Level of Function : Needs assist              Mobility Comments: Bedbound at baseline, can slide board to w/c with heavy assist from family.  Patient might get to the wheelchiar 1/wk ADLs Comments: Spouse assists with bathing and dressing at bedlevel.  Patient is able to feed himself        OT Problem List: Decreased strength;Decreased range of motion;Impaired balance (sitting and/or standing)      OT Treatment/Interventions:      OT Goals(Current goals can be found in the care plan section) Acute Rehab OT Goals Patient Stated Goal: Return home OT Goal Formulation: With patient Time For Goal Achievement: 04/16/23 Potential to Achieve Goals: Good  OT Frequency:      Co-evaluation PT/OT/SLP Co-Evaluation/Treatment: Yes Reason for Co-Treatment: Complexity of the patient's impairments (multi-system involvement);For patient/therapist safety   OT goals addressed during session: ADL's and self-care      AM-PAC OT "6 Clicks" Daily Activity     Outcome Measure Help from another person eating meals?: None Help from another person taking care of personal grooming?: A Little Help from another person toileting, which includes using toliet, bedpan, or urinal?: Total Help from another person bathing (including washing, rinsing, drying)?: A Lot Help from another person to put on and taking off regular upper body clothing?: A Lot Help from another person to put on and taking off regular lower body clothing?: Total 6 Click Score: 13   End of Session Nurse Communication: Other (comment) (Patient with loose stoll, wife states RN wants stool sample)  Activity Tolerance: Patient tolerated treatment well Patient left: in bed;with call bell/phone within reach;with family/visitor present  OT Visit Diagnosis: Other symptoms and signs involving the nervous system (R29.898);Muscle weakness (generalized) (M62.81)                Time: 1610-9604 OT Time Calculation (min): 27 min Charges:  OT General Charges $OT Visit: 1 Visit OT  Evaluation $OT Eval Moderate Complexity: 1 Mod  04/13/2023  RP, OTR/L  Acute Rehabilitation Services  Office:  [phone]   Suzanna Obey 04/13/2023, 12:14 PM

## 2023-04-13 NOTE — Progress Notes (Signed)
PROGRESS NOTE    John Meza  UJW:[id number] DOB: 27-Jun-1961 DOA: 04/11/2023 PCP: Storm Frisk, MD   Brief Narrative:  Patient is a 62 year old chronically ill-appearing Hispanic male with a past medical history significant for but #2 paraplegia, neurogenic bladder who self caths 3 times at home daily, recurrent UTIs with history of ESBL UTI, diabetes mellitus type 2, hypertension, chronic right buttock pressure ulcer as follows former smoker who presented with fevers.  He reported having intermittent fevers and chills the last 5 days and symptoms included decreased appetite and generalized weakness.  He is paraplegic with his neurogenic bladder and wife helps him self cath but she noted that he had some cloudy urine over the last 5 days.  He came to the ED on 04/01/2023 with fevers and was suspected to have UTI and was given IV ceftriaxone and discharged on p.o. ciprofloxacin at home.  He is compliant with the Cipro at that time however he continued have fevers and also started having some watery diarrhea.  Given his persistent symptoms he came to the ED and was found to be septic and is currently being admitted with a working diagnosis of sepsis secondary to complicated urinary tract infection with history of ESBL UTI in the setting of a patient with neurogenic bladder.  Other workup is initiated and he is getting IV fluid resuscitation.  Blood cultures x 2 repeated today given his recurrent fevers.  Assessment and Plan:  Secondary to complicated urinary tract infection with a history of ESBL UTI and history of neurogenic bladder with self cath 3 times daily -Patient met sepsis criteria on admission and etiology is likely related to recurrent UTI -Patient was given ciprofloxacin 5 mg p.o. twice daily for 7 days for UTI during an ED visit on 04/01/2023 -Urine culture at that time grew pansensitive Klebsiella and given his history of ESBL infection he has not been initiated on  meropenem with urine culture pending -The patient is still septic and spiking temperatures will give 1 L fluid bolus of 1.5 L and follow-up on the blood and urine cultures -WBC and Lactic Acid Level Trend: Recent Labs  Lab 03/17/23 1107 04/09/23 0932 04/11/23 1301 04/11/23 1400 04/11/23 1839 04/12/23 0430  WBC 7.2 11.2* 13.7*  --   --  11.0*  LATICACIDVEN  --   --   --    < > 1.4  --    < > = values in this interval not displayed.  -Urinalysis done and showed a cloudy appearance with amber color urine, 50 hemoglobin, moderate leukocytes, negative nitrites, rare bacteria, 0-5 RBCs per high-power field, 0-5 squamous epithelial cells and 21-50 WBCs -Cultures x 2 given the patient spiked very high temperatures -Chest x-ray done and showed no acute cardiopulmonary disease -Urine culture showed no growth -Patient has been having diarrhea and C. difficile been negative but will check a GI pathogen panel -Continue with IV fluid hydration with normal saline at 75 MLS per hour  Diabetes Mellitus Type 2 -Last hemoglobin A1c was 6.5 back in January For repeat here Can continue carb modified diet -Hold oral diabetic medications and placed on sensitive NovoLog/scale insulin AC -Continue monitor CBGs per protocol and CBG and glucose trends showing: Recent Labs  Lab 04/09/23 0932 04/11/23 1301 04/12/23 0430  GLUCOSE 181* 209* 154*   Recent Labs  Lab 04/11/23 1815 04/11/23 2021 04/12/23 0757 04/12/23 1233 04/12/23 1730  GLUCAP 175* 187* 148* 222* 194*  -Diabetes Education Coordinator consulted for further evaluation and Recommendations  Paraplegia -Continue with muscle relaxants and supportive care; patient self caths at home  Hyperlipidemia -Continue with atorvastatin 20 mg p.o. daily  AKI -BUN/Cr Trend: Recent Labs  Lab 04/09/23 0932 04/11/23 1301 04/12/23 0430  BUN 27* 28* 22*  CREATININE 1.16 1.42* 1.06  -C/w IVF hydration -Avoid Nephrotoxic Medications, Contrast Dyes,  Hypotension and Dehydration to Ensure Adequate Renal Perfusion and will need to Renally Adjust Meds -Continue to Monitor and Trend Renal Function carefully and repeat CMP in the AM   Hyponatremia -Na+ Trend: Recent Labs  Lab 04/09/23 0932 04/11/23 1301 04/12/23 0430  NA 129* 127* 130*  -C/w IVF with NS at 75 mL/hr -Continue to Monitor and Trend and repeat CMP in the AM  Metabolic Acidosis -Patient has a CO2 of 18, Chloride Level Level of 103, AG of 9 -Continue IVF Hydration -Continue to Monitor and trend and Repeat CMP in the AM  Normocytic Anemia/chronic iron deficiency anemia -Hgb/Hct Trend: Recent Labs  Lab 03/17/23 1107 04/09/23 0932 04/11/23 1301 04/12/23 0430  HGB 10.8* 9.8* 8.8* 7.8*  HCT 33.3*  33.9* 29.8* 26.2* 23.6*  MCV 86.5 85.1 87.0 86.8  -Check Anemia Panel in the AM -Continue with ferrous sulfate 325 mg p.o. daily -Continue to Monitor for S/Sx of Bleeding; No overt bleeding noted -Repeat CBC in the AM.  Follow-up with Hematology outpatient setting  Hypokalemia -Patient's K+ Level Trend: Recent Labs  Lab 04/09/23 0932 04/11/23 1301 04/12/23 0430  K 3.8 3.2* 3.1*  -Replete with po Kcl 40 mEQ BID x2 -Continue to Monitor and Replete as Necessary -Repeat CMP in the AM   Hypoalbuminemia -Patient's Albumin Trend: Recent Labs  Lab 04/09/23 0932 04/12/23 0430  ALBUMIN 2.4* 1.8*  -Continue to Monitor and Trend and repeat CMP in the   Chronic Right Buttock pressure ulcer, poA Pressure Injury 04/11/23 Buttocks Right Unstageable - Full thickness tissue loss in which the base of the injury is covered by slough (yellow, tan, gray, green or brown) and/or eschar (tan, brown or black) in the wound bed. (Active)  04/11/23 1814  Location: Buttocks  Location Orientation: Right  Staging: Unstageable - Full thickness tissue loss in which the base of the injury is covered by slough (yellow, tan, gray, green or brown) and/or eschar (tan, brown or black) in the  wound bed.  Wound Description (Comments):   Present on Admission: Yes  -WOC Nurse Consult and will continue the Medihoney  Obesity -Complicates overall prognosis and care -Estimated body mass index is 32.92 kg/m as calculated from the following:   Height as of this encounter: 5\' 4"  (1.626 m).   Weight as of this encounter: 87 kg.  -Weight Loss and Dietary Counseling given   DVT prophylaxis: Place and maintain sequential compression device Start: 04/13/23 1043 Place and maintain sequential compression device Start: 04/13/23 1043 enoxaparin (LOVENOX) injection 40 mg Start: 04/11/23 1800    Code Status: Full Code Family Communication: Discussed with wife at bedside  Disposition Plan:  Level of care: Telemetry Medical Status is: Inpatient Remains inpatient appropriate because: Needs further clinical improvement and further workup of his diarrhea   Consultants:  None  Procedures:  As delineated as above  Antimicrobials:  Anti-infectives (From admission, onward)    Start     Dose/Rate Route Frequency Ordered Stop   04/11/23 1400  meropenem (MERREM) 1 g in sodium chloride 0.9 % 100 mL IVPB        1 g 200 mL/hr over 30 Minutes Intravenous Every 8 hours 04/11/23 1352  04/11/23 1345  cefTRIAXone (ROCEPHIN) 1 g in sodium chloride 0.9 % 100 mL IVPB  Status:  Discontinued        1 g 200 mL/hr over 30 Minutes Intravenous  Once 04/11/23 1337 04/11/23 1344   04/11/23 1345  ertapenem (INVANZ) 1,000 mg in sodium chloride 0.9 % 100 mL IVPB  Status:  Discontinued        1 g 200 mL/hr over 30 Minutes Intravenous  Once 04/11/23 1344 04/11/23 1350       Subjective: Seen and Examined at bedside systems and review of translator Jesus [phone] and the patient states that he is doing okay.  Having quite a bit of diarrhea still.  Denies any abdominal discomfort but his wife states that he gets a little bit winded whenever he sits up.  No nausea or vomiting has not had any more fevers  now.  Objective: Vitals:   04/13/23 0438 04/13/23 0811 04/13/23 1216 04/13/23 1734  BP: (!) 144/70 133/65 (!) 144/69 (!) 154/69  Pulse: (!) 106 90 96 (!) 106  Resp: 18 17 17 17   Temp: (!) 101.3 F (38.5 C) 98 F (36.7 C) 99.1 F (37.3 C) 100.1 F (37.8 C)  TempSrc: Oral Oral Oral Oral  SpO2: 99% 100% 100% 99%  Weight:      Height:        Intake/Output Summary (Last 24 hours) at 04/13/2023 1939 Last data filed at 04/13/2023 1501 Gross per 24 hour  Intake 2133.82 ml  Output --  Net 2133.82 ml   Filed Weights   04/11/23 1252  Weight: 87 kg   Examination: Physical Exam:  Constitutional: WN/WD obese Hispanic male who is resting in no acute distress Respiratory: Diminished to auscultation bilaterally, no wheezing, rales, rhonchi or crackles. Normal respiratory effort and patient is not tachypenic. No accessory muscle use.  Cardiovascular: RRR, no murmurs / rubs / gallops. S1 and S2 auscultated.  No pressure extremity edema Abdomen: Soft, non-tender, distended secondary to body habitus. Bowel sounds positive.  GU: Deferred. Musculoskeletal: No clubbing / cyanosis of digits/nails. No joint deformity upper and lower extremities but patient is paraplegic Skin: No rashes, lesions, ulcers on limited skin evaluation. No induration; Warm and dry.  Neurologic: CN 2-12 grossly intact with no focal deficits.  Patient is paraplegic Psychiatric: Normal judgment and insight. Alert and oriented x 3. Normal mood and appropriate affect.   Data Reviewed: I have personally reviewed following labs and imaging studies  CBC: Recent Labs  Lab 04/09/23 0932 04/11/23 1301 04/12/23 0430 04/13/23 0024  WBC 11.2* 13.7* 11.0* 14.3*  NEUTROABS 8.5* 10.9*  --  11.8*  HGB 9.8* 8.8* 7.8* 8.8*  HCT 29.8* 26.2* 23.6* 26.4*  MCV 85.1 87.0 86.8 87.1  PLT 304 323 293 346   Basic Metabolic Panel: Recent Labs  Lab 04/09/23 0932 04/11/23 1301 04/12/23 0430 04/13/23 0024  NA 129* 127* 130* 133*  K 3.8  3.2* 3.1* 4.0  CL 101 98 103 108  CO2 18* 15* 18* 18*  GLUCOSE 181* 209* 154* 156*  BUN 27* 28* 22* 17  CREATININE 1.16 1.42* 1.06 0.84  CALCIUM 8.4* 8.0* 7.9* 7.8*  MG  --   --   --  1.3*  PHOS  --   --   --  2.0*   GFR: Estimated Creatinine Clearance: 94.2 mL/min (by C-G formula based on SCr of 0.84 mg/dL). Liver Function Tests: Recent Labs  Lab 04/09/23 0932 04/12/23 0430 04/13/23 0024  AST 17 22 26   ALT  20 22 27   ALKPHOS 99 94 105  BILITOT 0.7 0.5 0.5  PROT 7.0 5.6* 5.5*  ALBUMIN 2.4* 1.8* 1.7*   No results for input(s): "LIPASE", "AMYLASE" in the last 168 hours. No results for input(s): "AMMONIA" in the last 168 hours. Coagulation Profile: No results for input(s): "INR", "PROTIME" in the last 168 hours. Cardiac Enzymes: No results for input(s): "CKTOTAL", "CKMB", "CKMBINDEX", "TROPONINI" in the last 168 hours. BNP (last 3 results) No results for input(s): "PROBNP" in the last 8760 hours. HbA1C: Recent Labs    04/12/23 0430  HGBA1C 7.0*   CBG: Recent Labs  Lab 04/12/23 1730 04/12/23 2046 04/13/23 0806 04/13/23 1215 04/13/23 1733  GLUCAP 194* 204* 153* 155* 200*   Lipid Profile: No results for input(s): "CHOL", "HDL", "LDLCALC", "TRIG", "CHOLHDL", "LDLDIRECT" in the last 72 hours. Thyroid Function Tests: No results for input(s): "TSH", "T4TOTAL", "FREET4", "T3FREE", "THYROIDAB" in the last 72 hours. Anemia Panel: No results for input(s): "VITAMINB12", "FOLATE", "FERRITIN", "TIBC", "IRON", "RETICCTPCT" in the last 72 hours. Sepsis Labs: Recent Labs  Lab 04/11/23 1400 04/11/23 1839  LATICACIDVEN 1.6 1.4    Recent Results (from the past 240 hour(s))  SARS CORONAVIRUS 2 (TAT 6-24 HRS) Anterior Nasal Swab     Status: None   Collection Time: 04/09/23  9:35 AM   Specimen: Anterior Nasal Swab  Result Value Ref Range Status   SARS Coronavirus 2 NEGATIVE NEGATIVE Final    Comment: (NOTE) SARS-CoV-2 target nucleic acids are NOT DETECTED.  The SARS-CoV-2  RNA is generally detectable in upper and lower respiratory specimens during the acute phase of infection. Negative results do not preclude SARS-CoV-2 infection, do not rule out co-infections with other pathogens, and should not be used as the sole basis for treatment or other patient management decisions. Negative results must be combined with clinical observations, patient history, and epidemiological information. The expected result is Negative.  Fact Sheet for Patients: HairSlick.no  Fact Sheet for Healthcare Providers: quierodirigir.com  This test is not yet approved or cleared by the [place] FDA and  has been authorized for detection and/or diagnosis of SARS-CoV-2 by FDA under an Emergency Use Authorization (EUA). This EUA will remain  in effect (meaning this test can be used) for the duration of the COVID-19 declaration under Se ction 564(b)(1) of the Act, 21 U.S.C. section 360bbb-3(b)(1), unless the authorization is terminated or revoked sooner.  Performed at [facility], [address] [place], [place] [address]   Urine Culture     Status: Abnormal   Collection Time: 04/09/23  9:53 AM   Specimen: Urine, Clean Catch  Result Value Ref Range Status   Specimen Description URINE, CLEAN CATCH  Final   Special Requests   Final    Normal Performed at [facility], [address], [place], [place] [address]    Culture (A)  Final    >=100,000 COLONIES/mL KLEBSIELLA PNEUMONIAE >=100,000 COLONIES/mL MORGANELLA MORGANII    Report Status 04/13/2023 FINAL  Final   Organism ID, Bacteria KLEBSIELLA PNEUMONIAE (A)  Final   Organism ID, Bacteria MORGANELLA MORGANII (A)  Final      Susceptibility   Klebsiella pneumoniae - MIC*    AMPICILLIN >=32 RESISTANT Resistant     CEFAZOLIN <=4 SENSITIVE Sensitive     CEFEPIME <=0.12 SENSITIVE Sensitive     CEFTRIAXONE <=0.25 SENSITIVE Sensitive     CIPROFLOXACIN <=0.25  SENSITIVE Sensitive     GENTAMICIN <=1 SENSITIVE Sensitive     IMIPENEM <=0.25  SENSITIVE Sensitive     NITROFURANTOIN 32 SENSITIVE Sensitive     TRIMETH/SULFA <=20 SENSITIVE Sensitive     AMPICILLIN/SULBACTAM 16 INTERMEDIATE Intermediate     PIP/TAZO <=4 SENSITIVE Sensitive     * >=100,000 COLONIES/mL KLEBSIELLA PNEUMONIAE   Morganella morganii - MIC*    AMPICILLIN >=32 RESISTANT Resistant     CIPROFLOXACIN <=0.25 SENSITIVE Sensitive     GENTAMICIN 8 INTERMEDIATE Intermediate     IMIPENEM <=0.25 SENSITIVE Sensitive     NITROFURANTOIN >=512 RESISTANT Resistant     TRIMETH/SULFA <=20 SENSITIVE Sensitive     AMPICILLIN/SULBACTAM >=32 RESISTANT Resistant     * >=100,000 COLONIES/mL MORGANELLA MORGANII  Culture, blood (routine x 2)     Status: None (Preliminary result)   Collection Time: 04/11/23  1:36 PM   Specimen: BLOOD  Result Value Ref Range Status   Specimen Description BLOOD SITE NOT SPECIFIED  Final   Special Requests   Final    BOTTLES DRAWN AEROBIC AND ANAEROBIC Blood Culture adequate volume   Culture   Final    NO GROWTH 2 DAYS Performed at [facility], [address] [place], [place] [address]    Report Status PENDING  Incomplete  Culture, blood (routine x 2)     Status: None (Preliminary result)   Collection Time: 04/11/23  1:41 PM   Specimen: BLOOD  Result Value Ref Range Status   Specimen Description BLOOD SITE NOT SPECIFIED  Final   Special Requests   Final    BOTTLES DRAWN AEROBIC AND ANAEROBIC Blood Culture adequate volume   Culture   Final    NO GROWTH 2 DAYS Performed at [facility], [address] [place], [place] [address]    Report Status PENDING  Incomplete  Urine Culture     Status: None   Collection Time: 04/11/23  2:00 PM   Specimen: Urine, Random  Result Value Ref Range Status   Specimen Description URINE, RANDOM  Final   Special Requests NONE Reflexed from [id number]  Final   Culture   Final    NO GROWTH Performed at [facility], [address], [place], [place] [address]    Report Status 04/12/2023 FINAL  Final  C Difficile Quick Screen w PCR reflex     Status: None   Collection Time: 04/12/23  2:58 PM   Specimen: STOOL  Result Value Ref Range Status   C Diff antigen NEGATIVE NEGATIVE Final   C Diff toxin NEGATIVE NEGATIVE Final   C Diff interpretation No C. difficile detected.  Final    Comment: Performed at [facility], [address] [place], [place] [address]  Culture, blood (Routine X 2) w Reflex to ID Panel     Status: None (Preliminary result)   Collection Time: 04/12/23  7:03 PM   Specimen: BLOOD LEFT HAND  Result Value Ref Range Status   Specimen Description BLOOD LEFT HAND  Final   Special Requests   Final    BOTTLES DRAWN AEROBIC AND ANAEROBIC Blood Culture adequate volume   Culture   Final    NO GROWTH < 24 HOURS Performed at [facility], [address], [place], [place] [address]    Report Status PENDING  Incomplete  Culture, blood (Routine X 2) w Reflex to ID Panel     Status: None (Preliminary result)   Collection Time: 04/12/23  7:03 PM   Specimen: BLOOD  Result Value Ref Range Status  Specimen Description BLOOD SITE NOT SPECIFIED  Final   Special Requests   Final    BOTTLES DRAWN AEROBIC AND ANAEROBIC Blood Culture adequate volume   Culture   Final    NO GROWTH < 24 HOURS Performed at [facility], [address], [place], [place] [address]    Report Status PENDING  Incomplete    Radiology Studies: DG Knee Right Port  Result Date: 04/13/2023 CLINICAL DATA:  Swelling EXAM: PORTABLE RIGHT KNEE - 4 VIEW COMPARISON:  X-ray 05/23/2022 FINDINGS: Severe osteopenia. No acute fracture or dislocation. Overall preserved joint spaces. Tiny osteophyte formation. No joint effusion. IMPRESSION: Osteopenia with slight degenerative change. Electronically Signed   By: Karen Kays M.D.   On: 04/13/2023 18:15   DG CHEST PORT 1 VIEW  Result Date: 04/13/2023 CLINICAL DATA:   Shortness of breath EXAM: PORTABLE CHEST 1 VIEW COMPARISON:  04/11/2023 FINDINGS: Underinflation. No consolidation, pneumothorax or effusion. No edema. Normal cardiopericardial silhouette. Overlapping cardiac leads. Fixation hardware along the thoracolumbar spine. Old left-sided rib deformities. IMPRESSION: Underinflation.  No acute cardiopulmonary disease.  Chronic changes. Electronically Signed   By: Karen Kays M.D.   On: 04/13/2023 17:47    Scheduled Meds:  atorvastatin  20 mg Oral Q1200   enoxaparin (LOVENOX) injection  40 mg Subcutaneous Q24H   ferrous sulfate  325 mg Oral Daily   insulin aspart  0-5 Units Subcutaneous QHS   insulin aspart  0-9 Units Subcutaneous TID WC   leptospermum manuka honey  1 Application Topical Daily   potassium chloride  40 mEq Oral BID   sodium bicarbonate  650 mg Oral BID   Continuous Infusions:  meropenem (MERREM) IV 1 g (04/13/23 1327)    LOS: 2 days   Marguerita Merles, DO [facility] Available via Epic secure chat 7am-7pm After these hours, please refer to coverage provider listed on [email or web address] 04/13/2023, 7:39 PM

## 2023-04-13 NOTE — TOC CM/SW Note (Signed)
Transition of Care [facility]) - Inpatient Brief Assessment   Patient Details  Name: John Meza MRN: [id number] Date of Birth: 10-10-1961  Transition of Care [facility]) CM/SW Contact:    Kermit Balo, RN Phone Number: 04/13/2023, 2:33 PM   Clinical Narrative: Pt admitted with sepsis. From home with his spouse.  Awaiting therapy evals.  PCP: [facility] and uses [employer] [facility] for his meds.  TOC following.   Transition of Care Asessment: Insurance and Status: Selfpay Patient has primary care physician: Yes     Prior/Current Home Services: No current home services Social Determinants of Health Reivew: SDOH reviewed no interventions necessary Readmission risk has been reviewed: Yes Transition of care needs: transition of care needs identified, TOC will continue to follow

## 2023-04-14 ENCOUNTER — Encounter: Payer: Self-pay | Admitting: Hematology

## 2023-04-14 ENCOUNTER — Other Ambulatory Visit (HOSPITAL_COMMUNITY): Payer: Self-pay

## 2023-04-14 LAB — CBC WITH DIFFERENTIAL/PLATELET
Abs Immature Granulocytes: 0.36 10*3/uL — ABNORMAL HIGH (ref 0.00–0.07)
Basophils Absolute: 0.1 10*3/uL (ref 0.0–0.1)
Basophils Relative: 0 %
Eosinophils Absolute: 0 10*3/uL (ref 0.0–0.5)
Eosinophils Relative: 0 %
HCT: 28.5 % — ABNORMAL LOW (ref 39.0–52.0)
Hemoglobin: 9.3 g/dL — ABNORMAL LOW (ref 13.0–17.0)
Immature Granulocytes: 2 %
Lymphocytes Relative: 10 %
Lymphs Abs: 1.7 10*3/uL (ref 0.7–4.0)
MCH: 28.5 pg (ref 26.0–34.0)
MCHC: 32.6 g/dL (ref 30.0–36.0)
MCV: 87.4 fL (ref 80.0–100.0)
Monocytes Absolute: 1.7 10*3/uL — ABNORMAL HIGH (ref 0.1–1.0)
Monocytes Relative: 10 %
Neutro Abs: 13.1 10*3/uL — ABNORMAL HIGH (ref 1.7–7.7)
Neutrophils Relative %: 78 %
Platelets: 410 10*3/uL — ABNORMAL HIGH (ref 150–400)
RBC: 3.26 MIL/uL — ABNORMAL LOW (ref 4.22–5.81)
RDW: 14.9 % (ref 11.5–15.5)
WBC: 16.9 10*3/uL — ABNORMAL HIGH (ref 4.0–10.5)
nRBC: 0 % (ref 0.0–0.2)

## 2023-04-14 LAB — GASTROINTESTINAL PANEL BY PCR, STOOL (REPLACES STOOL CULTURE)

## 2023-04-14 LAB — COMPREHENSIVE METABOLIC PANEL
ALT: 32 U/L (ref 0–44)
AST: 36 U/L (ref 15–41)
Albumin: 1.7 g/dL — ABNORMAL LOW (ref 3.5–5.0)
Alkaline Phosphatase: 123 U/L (ref 38–126)
Anion gap: 7 (ref 5–15)
BUN: 14 mg/dL (ref 6–20)
CO2: 19 mmol/L — ABNORMAL LOW (ref 22–32)
Calcium: 7.9 mg/dL — ABNORMAL LOW (ref 8.9–10.3)
Chloride: 104 mmol/L (ref 98–111)
Creatinine, Ser: 0.88 mg/dL (ref 0.61–1.24)
GFR, Estimated: >60 mL/min (ref >60)
Glucose, Bld: 219 mg/dL — ABNORMAL HIGH (ref 70–99)
Potassium: 4.5 mmol/L (ref 3.5–5.1)
Sodium: 130 mmol/L — ABNORMAL LOW (ref 135–145)
Total Bilirubin: 0.4 mg/dL (ref 0.3–1.2)
Total Protein: 5.7 g/dL — ABNORMAL LOW (ref 6.5–8.1)

## 2023-04-14 LAB — GLUCOSE, CAPILLARY
Glucose-Capillary: 190 mg/dL — ABNORMAL HIGH (ref 70–99)
Glucose-Capillary: 230 mg/dL — ABNORMAL HIGH (ref 70–99)
Glucose-Capillary: 239 mg/dL — ABNORMAL HIGH (ref 70–99)
Glucose-Capillary: 226 mg/dL — ABNORMAL HIGH (ref 70–99)

## 2023-04-14 LAB — CULTURE, BLOOD (ROUTINE X 2): Special Requests: ADEQUATE

## 2023-04-14 LAB — PHOSPHORUS: Phosphorus: 2.4 mg/dL — ABNORMAL LOW (ref 2.5–4.6)

## 2023-04-14 LAB — MAGNESIUM: Magnesium: 1.9 mg/dL (ref 1.7–2.4)

## 2023-04-14 MED ORDER — CIPROFLOXACIN HCL 500 MG PO TABS
500.0000 mg | ORAL_TABLET | Freq: Two times a day (BID) | ORAL | 0 refills | Status: DC
  Filled 2023-04-14: qty 12, 6d supply, fill #0

## 2023-04-14 MED ORDER — ACETAMINOPHEN 325 MG PO TABS: 650.0000 mg | ORAL_TABLET | Freq: Four times a day (QID) | ORAL | Status: DC | PRN

## 2023-04-14 MED ORDER — CIPROFLOXACIN HCL 500 MG PO TABS
500.0000 mg | ORAL_TABLET | Freq: Two times a day (BID) | ORAL | Status: DC
  Administered 2023-04-15: 500 mg via ORAL
  Filled 2023-04-14: qty 1

## 2023-04-14 MED ORDER — METFORMIN HCL 500 MG PO TABS
1000.0000 mg | ORAL_TABLET | Freq: Two times a day (BID) | ORAL | Status: DC
  Administered 2023-04-15: 1000 mg via ORAL
  Filled 2023-04-14: qty 2

## 2023-04-14 MED ORDER — INSULIN ASPART 100 UNIT/ML IJ SOLN
0.0000 [IU] | Freq: Every day | INTRAMUSCULAR | Status: DC
  Administered 2023-04-14: 2 [IU] via SUBCUTANEOUS

## 2023-04-14 MED ORDER — INSULIN ASPART 100 UNIT/ML IJ SOLN
0.0000 [IU] | Freq: Three times a day (TID) | INTRAMUSCULAR | Status: DC
  Administered 2023-04-15 (×2): 5 [IU] via SUBCUTANEOUS

## 2023-04-14 NOTE — Progress Notes (Signed)
Pharmacy Antibiotic Note  John Meza is a 62 y.o. male admitted on 04/11/2023 with sepsis with recent ESBL E coli bacteremia.  Pharmacy has been consulted for meropenem dosing.  WBC 16.9, Tmax 100.8 Scr 0.88, stable at baseline  Plan: Meropenem 1g IV q8h  Monitor daily CBC, temp, SCr, and for clinical signs of improvement  F/u cultures and de-escalate antibiotics as able   Height: 5\' 4"  (162.6 cm) Weight: 87 kg (191 lb 12.8 oz) IBW/kg (Calculated) : 59.2  Temp (24hrs), Avg:99.4 F (37.4 C), Min:98 F (36.7 C), Max:100.8 F (38.2 C)  Recent Labs  Lab 04/09/23 0932 04/11/23 1301 04/11/23 1400 04/11/23 1839 04/12/23 0430 04/13/23 0024 04/14/23 0213  WBC 11.2* 13.7*  --   --  11.0* 14.3* 16.9*  CREATININE 1.16 1.42*  --   --  1.06 0.84 0.88  LATICACIDVEN  --   --  1.6 1.4  --   --   --      Estimated Creatinine Clearance: 89.9 mL/min (by C-G formula based on SCr of 0.88 mg/dL).    Allergies  Allergen Reactions   Macrobid [Nitrofurantoin] Itching and Rash    Antimicrobials this admission: Meropenem 6/30 >>   Dose adjustments this admission: N/A  Microbiology results: 6/30 BCx: ngtd 6/30 Ucx: ngtd 7/1 Bcx: ngtd   Recent microbiology results prior to admission: 6/28 Ur cx: >100K Klebsiella pneumoniae (R: ampicillin, I: ampicillin/sulbactam, S: everything else), AND >100K Morganella morganii - sensitivities pending 6/8 Bld cx x2: 3/4 ESBL E coli 6/8 Ur cx: >100K yeast   Thank you for allowing pharmacy to be a part of this patient's care.  Andreas Ohm, PharmD Pharmacy Resident  04/14/2023 7:56 AM

## 2023-04-14 NOTE — Inpatient Diabetes Management (Signed)
Inpatient Diabetes Program Recommendations  AACE/ADA: New Consensus Statement on Inpatient Glycemic Control (2015)  Target Ranges:  Prepandial:   less than 140 mg/dL      Peak postprandial:   less than 180 mg/dL (1-2 hours)      Critically ill patients:  140 - 180 mg/dL   Lab Results  Component Value Date   GLUCAP 190 (H) 04/14/2023   HGBA1C 7.0 (H) 04/12/2023    Review of Glycemic Control  Latest Reference Range & Units 04/13/23 08:06 04/13/23 12:15 04/13/23 17:33 04/13/23 21:18 04/14/23 08:34  Glucose-Capillary 70 - 99 mg/dL 161 (H) 096 (H) 045 (H) 241 (H) 190 (H)   Diabetes history: DM 2 Outpatient Diabetes medications: metformin 1000 mg bid, Januvia 100 mg Daily Current orders for Inpatient glycemic control:  Novolog 0-9 units tid + hs  Inpatient Diabetes Program Recommendations:    Note: Glucose trends elevated  -   Consider increasing Novolog Correction to 0-15 units tid + hs  Thanks,  Christena Deem RN, MSN, BC-ADM Inpatient Diabetes Coordinator Team Pager [phone] (8a-5p)

## 2023-04-14 NOTE — Progress Notes (Signed)
Pt and pt wife requesting MD come to room to answer some of their questions and concerns. [name],MD made aware

## 2023-04-14 NOTE — Progress Notes (Signed)
John Meza  WUJ:[id number] DOB: September 05, 1961 DOA: 04/11/2023 PCP: Storm Frisk, MD    Brief Narrative:  62 year old with a history of paraplegia with resultant neurogenic bladder requiring self-catheterization 3 times a day and recurrent ESBL UTIs, DM2, HTN, and chronic right buttock pressure ulcer who presented to the ER 04/11/2023 with fever and chills for 5 days coincident with the development of cloudy urine.  He had been evaluated in the ER 04/01/2023 and was suspected of having a UTI at which time he was given IV ceftriaxone and discharged on p.o. ciprofloxacin.  Goals of Care:  Code Status: Full Code   DVT prophylaxis: Lovenox  Interim Hx: Afebrile.  Vital signs stable.  I spoke to the patient and his spouse at bedside using the iPad interpreter.  He states that he feels much better.  He is anxious to go home.  He reports a good appetite.  He denies abdominal pain fevers chills or shortness of breath.  Assessment & Plan:  Sepsis POA due to complicated urinary tract infection -history of ESBL UTI -neurogenic bladder Urine culture this admission no growth -urine culture 6/28 noted Klebsiella pneumoniae and Morganella morganii, both of which were reportedly sensitive to ciprofloxacin as well as imipenem - transition to oral cipro in morning and d/c home if remains stable over night   Diarrhea Likely related to antibiotic use -C. difficile screen and GI pathogen panel both negative  DM2 A1c 7.0 -CBG not yet at goal -adjust treatment and follow trend  Paraplegia Continue usual muscle relaxants and supportive care  HLD Continue usual atorvastatin  Acute kidney injury Creatinine peaked at 1.42 on date of admission and has improved with hydration  Hyponatremia Continue to follow trend  Chronic normocytic anemia  Hypokalemia Corrected with supplementation  Obesity - Body mass index is 32.92 kg/m.   Chronic right buttock pressure ulcer POA   Family  Communication: Spoke with patient and spouse at bedside Disposition: Anticipate discharge home 04/15/2023   Objective: Blood pressure 131/74, pulse (!) 101, temperature 98.8 F (37.1 C), temperature source Oral, resp. rate 17, height 5\' 4"  (1.626 m), weight 87 kg, SpO2 100 %.  Intake/Output Summary (Last 24 hours) at 04/14/2023 1031 Last data filed at 04/14/2023 0900 Gross per 24 hour  Intake 1199.88 ml  Output 500 ml  Net 699.88 ml   Filed Weights   04/11/23 1252  Weight: 87 kg    Examination: General: No acute respiratory distress Lungs: Clear to auscultation bilaterally without wheezes or crackles Cardiovascular: Regular rate and rhythm without murmur gallop or rub normal S1 and S2 Abdomen: Nontender, nondistended, soft, bowel sounds positive, no rebound, no ascites, no appreciable mass Extremities: No significant cyanosis, clubbing, or edema bilateral lower extremities  CBC: Recent Labs  Lab 04/11/23 1301 04/12/23 0430 04/13/23 0024 04/14/23 0213  WBC 13.7* 11.0* 14.3* 16.9*  NEUTROABS 10.9*  --  11.8* 13.1*  HGB 8.8* 7.8* 8.8* 9.3*  HCT 26.2* 23.6* 26.4* 28.5*  MCV 87.0 86.8 87.1 87.4  PLT 323 293 346 410*   Basic Metabolic Panel: Recent Labs  Lab 04/12/23 0430 04/13/23 0024 04/14/23 0213  NA 130* 133* 130*  K 3.1* 4.0 4.5  CL 103 108 104  CO2 18* 18* 19*  GLUCOSE 154* 156* 219*  BUN 22* 17 14  CREATININE 1.06 0.84 0.88  CALCIUM 7.9* 7.8* 7.9*  MG  --  1.3* 1.9  PHOS  --  2.0* 2.4*   GFR: Estimated Creatinine Clearance: 89.9 mL/min (by C-G  formula based on SCr of 0.88 mg/dL).   Scheduled Meds:  atorvastatin  20 mg Oral Q1200   enoxaparin (LOVENOX) injection  40 mg Subcutaneous Q24H   ferrous sulfate  325 mg Oral Daily   insulin aspart  0-5 Units Subcutaneous QHS   insulin aspart  0-9 Units Subcutaneous TID WC   leptospermum manuka honey  1 Application Topical Daily   potassium chloride  40 mEq Oral BID   sodium bicarbonate  650 mg Oral BID    Continuous Infusions:  meropenem (MERREM) IV 1 g (04/14/23 0551)     LOS: 3 days   Lonia Blood, MD [facility] Office  [phone] Pager - Text Page per Loretha Stapler  If 7PM-7AM, please contact night-coverage per Amion 04/14/2023, 10:31 AM

## 2023-04-15 LAB — COMPREHENSIVE METABOLIC PANEL
ALT: 34 U/L (ref 0–44)
AST: 37 U/L (ref 15–41)
Albumin: 1.6 g/dL — ABNORMAL LOW (ref 3.5–5.0)
Alkaline Phosphatase: 118 U/L (ref 38–126)
Anion gap: 7 (ref 5–15)
BUN: 14 mg/dL (ref 6–20)
CO2: 20 mmol/L — ABNORMAL LOW (ref 22–32)
Calcium: 7.9 mg/dL — ABNORMAL LOW (ref 8.9–10.3)
Chloride: 103 mmol/L (ref 98–111)
Creatinine, Ser: 0.82 mg/dL (ref 0.61–1.24)
GFR, Estimated: >60 mL/min (ref >60)
Glucose, Bld: 215 mg/dL — ABNORMAL HIGH (ref 70–99)
Potassium: 4.2 mmol/L (ref 3.5–5.1)
Sodium: 130 mmol/L — ABNORMAL LOW (ref 135–145)
Total Bilirubin: 0.4 mg/dL (ref 0.3–1.2)
Total Protein: 5.2 g/dL — ABNORMAL LOW (ref 6.5–8.1)

## 2023-04-15 LAB — CULTURE, BLOOD (ROUTINE X 2)

## 2023-04-15 LAB — CBC
HCT: 25.8 % — ABNORMAL LOW (ref 39.0–52.0)
Hemoglobin: 8.5 g/dL — ABNORMAL LOW (ref 13.0–17.0)
MCH: 28.9 pg (ref 26.0–34.0)
MCHC: 32.9 g/dL (ref 30.0–36.0)
MCV: 87.8 fL (ref 80.0–100.0)
Platelets: 407 10*3/uL — ABNORMAL HIGH (ref 150–400)
RBC: 2.94 MIL/uL — ABNORMAL LOW (ref 4.22–5.81)
RDW: 14.9 % (ref 11.5–15.5)
WBC: 13.4 10*3/uL — ABNORMAL HIGH (ref 4.0–10.5)
nRBC: 0 % (ref 0.0–0.2)

## 2023-04-15 LAB — GLUCOSE, CAPILLARY
Glucose-Capillary: 206 mg/dL — ABNORMAL HIGH (ref 70–99)
Glucose-Capillary: 220 mg/dL — ABNORMAL HIGH (ref 70–99)

## 2023-04-15 MED ORDER — ORAL CARE MOUTH RINSE: 15.0000 mL | OROMUCOSAL | Status: DC | PRN

## 2023-04-15 MED ORDER — ACETAMINOPHEN 325 MG PO TABS: 650.0000 mg | ORAL_TABLET | Freq: Four times a day (QID) | ORAL | Status: DC | PRN

## 2023-04-15 NOTE — Discharge Summary (Addendum)
DISCHARGE SUMMARY  John Meza  MR#: [id number]  DOB:[birth year]  Date of Admission: 04/11/2023 Date of Discharge: 04/15/2023  Attending Physician:Ellorie Kindall Silvestre Gunner, MD  Patient's WUJ:[id number], Charlcie Cradle, MD  Consults: none   Disposition: D/C home   Follow-up Appts:  Follow-up Information     Storm Frisk, MD. Schedule an appointment as soon as possible for a visit.   Specialty: Pulmonary Disease Contact information: [address] [place] [address] [phone]                 Tests Needing Follow-up: -assess CBG control with possible need to further titrate medical tx  Discharge Diagnoses: Sepsis POA due to complicated urinary tract infection - history of ESBL UTI - neurogenic bladder with UTI due to self-cath  Diarrhea DM2 with hyperglycemia  Paraplegia HLD Acute kidney injury Hyponatremia Chronic normocytic anemia Hypokalemia Obesity - Body mass index is 32.92 kg/m. Chronic right buttock pressure ulcer POA Hypophosphatemia    Initial presentation: 62 year old with a history of paraplegia with resultant neurogenic bladder requiring self-catheterization 3 times a day and recurrent ESBL UTIs, DM2, HTN, and chronic right buttock pressure ulcer who presented to the ER 04/11/2023 with fever and chills for 5 days coincident with the development of cloudy urine. He had been evaluated in the ER 04/01/2023 and was suspected of having a UTI at which time he was given IV ceftriaxone and discharged on p.o. antibiotics.   Hospital Course:  Sepsis POA due to complicated urinary tract infection -history of ESBL UTI -neurogenic bladder Urine culture this admission no growth - urine culture 6/28 noted Klebsiella pneumoniae and Morganella morganii, both of which were reportedly sensitive to ciprofloxacin as well as imipenem -rapidly improved with utilization of IV meropenem - transitioned to oral cipro for d/c home with patient having  returned to his baseline health status   Diarrhea Likely related to antibiotic use -C. difficile screen and GI pathogen panel both negative -diarrhea essentially resolved at time of discharge   DM2 A1c 7.0 -CBG not yet at goal -ongoing outpatient monitoring of CBG and probable further titration of medications will be required   Paraplegia Continue usual muscle relaxants and supportive care   HLD Continue usual atorvastatin   Acute kidney injury Creatinine peaked at 1.42 on date of admission and has improved/normalized with hydration -creatinine normal at time of discharge   Hyponatremia Na stable at 130 at time of d/c -old records suggest this is a chronic issue with a baseline sodium of ~130   Chronic normocytic anemia Hgb stable at time of d/c   Hypokalemia Corrected with supplementation   Obesity - Body mass index is 32.92 kg/m.   Chronic right buttock pressure ulcer POA Continue wound care with Medihoney daily covered with foam dressing  Allergies as of 04/15/2023       Reactions   Macrobid [nitrofurantoin] Itching, Rash        Medication List     STOP taking these medications    methocarbamol 500 MG tablet Commonly known as: ROBAXIN       TAKE these medications    acetaminophen 325 MG tablet Commonly known as: TYLENOL Take 2 tablets (650 mg total) by mouth every 6 (six) hours as needed for mild pain, fever or headache.   atorvastatin 20 MG tablet Commonly known as: LIPITOR Take 1 tablet (20 mg total) by mouth daily at 12 noon.   ciprofloxacin 500 MG tablet Commonly known as: CIPRO Take 1 tablet (  500 mg total) by mouth 2 (two) times daily. What changed: when to take this   famotidine 20 MG tablet Commonly known as: Pepcid Take 1 tablet (20 mg total) by mouth 2 (two) times daily.   FeroSul 325 (65 FE) MG tablet Generic drug: ferrous sulfate Tome 1 tableta (325 mg en total) por va oral diariamente. (Take 1 tablet (325 mg total) by mouth  daily.)   Januvia 100 MG tablet Generic drug: sitaGLIPtin Take 1 tablet (100 mg total) by mouth daily.   metFORMIN 1000 MG tablet Commonly known as: GLUCOPHAGE Take 1 tablet (1,000 mg total) by mouth 2 (two) times daily with a meal.   valsartan-hydrochlorothiazide 320-25 MG tablet Commonly known as: DIOVAN-HCT Tome 1 tableta por va oral diariamente. (Take 1 tablet by mouth daily.)   Vitamin D (Ergocalciferol) 1.25 MG (50000 UNIT) Caps capsule Commonly known as: DRISDOL Take 1 capsule (50,000 Units total) by mouth every 7 (seven) days. Please get updated vit D level.        Day of Discharge BP (!) 143/92 (BP Location: Left Arm)   Pulse 100   Temp 98.2 F (36.8 C) (Oral)   Resp 16   Ht 5\' 4"  (1.626 m)   Wt 87 kg   SpO2 99%   BMI 32.92 kg/m   Physical Exam: General: No acute respiratory distress Lungs: Clear to auscultation bilaterally Cardiovascular: Regular rate and rhythm without murmur  Abdomen: Nontender, nondistended, soft, bowel sounds positive, no rebound, no ascites Extremities: No significant edema bilateral lower extremities  Basic Metabolic Panel: Recent Labs  Lab 04/11/23 1301 04/12/23 0430 04/13/23 0024 04/14/23 0213 04/15/23 0042  NA 127* 130* 133* 130* 130*  K 3.2* 3.1* 4.0 4.5 4.2  CL 98 103 108 104 103  CO2 15* 18* 18* 19* 20*  GLUCOSE 209* 154* 156* 219* 215*  BUN 28* 22* 17 14 14   CREATININE 1.42* 1.06 0.84 0.88 0.82  CALCIUM 8.0* 7.9* 7.8* 7.9* 7.9*  MG  --   --  1.3* 1.9  --   PHOS  --   --  2.0* 2.4*  --     CBC: Recent Labs  Lab 04/09/23 0932 04/11/23 1301 04/12/23 0430 04/13/23 0024 04/14/23 0213 04/15/23 0042  WBC 11.2* 13.7* 11.0* 14.3* 16.9* 13.4*  NEUTROABS 8.5* 10.9*  --  11.8* 13.1*  --   HGB 9.8* 8.8* 7.8* 8.8* 9.3* 8.5*  HCT 29.8* 26.2* 23.6* 26.4* 28.5* 25.8*  MCV 85.1 87.0 86.8 87.1 87.4 87.8  PLT 304 323 293 346 410* 407*    Time spent in discharge (includes decision making & examination of pt): 35  minutes  04/15/2023, 11:49 AM   Lonia Blood, MD [facility] Office  [phone]

## 2023-04-16 ENCOUNTER — Encounter: Payer: Self-pay | Admitting: Hematology

## 2023-04-16 LAB — CULTURE, BLOOD (ROUTINE X 2): Culture: NO GROWTH

## 2023-04-17 LAB — CULTURE, BLOOD (ROUTINE X 2)
Culture: NO GROWTH
Culture: NO GROWTH
Special Requests: ADEQUATE
Special Requests: ADEQUATE

## 2023-04-19 ENCOUNTER — Telehealth: Payer: Self-pay

## 2023-04-19 NOTE — Transitions of Care (Post Inpatient/ED Visit) (Signed)
04/19/2023  Name: John Meza MRN: [id number] DOB: Jul 30, 1961  Today's TOC FU Call Status: Today's TOC FU Call Status:: Successful TOC FU Call Competed Unsuccessful Call (1st Attempt) Date: 04/19/23 [facility] FU Call Complete Date: 04/19/23  Transition Care Management Follow-up Telephone Call Date of Discharge: 04/15/23 Discharge Facility: [facility]) Type of Discharge: Inpatient Admission Primary Inpatient Discharge Diagnosis:: sepsis How have you been since you were released from the hospital?: Better Any questions or concerns?: Yes [name] explained that she submitted the documents for the [facility] and CAFA about 2 months ago and has left messages at the clinic but has not heard from anyone. I told her I would ask that our financial counselor contact her with an update.) Patient Questions/Concerns Addressed: Other: (message sent to Anabel Halon, Financial Counselor reqeusting an update on status of OC/CAFA applicaion.)  Items Reviewed: Did you receive and understand the discharge instructions provided?: Yes Medications obtained,verified, and reconciled?: Partial Review Completed Reason for Partial Mediation Review: His wife manages his medications and said he has all of his meds and she did not have any questions about the med regime and did not need to review the med list. He has a glucometer and she checks his blood sugars. Any new allergies since your discharge?: No Dietary orders reviewed?: No Do you have support at home?: Yes People in Home: spouse Name of Support/Comfort Primary Source: his wife  Medications Reviewed Today: Medications Reviewed Today     Reviewed by Card, Bryson Ha, CPhT (Pharmacy Technician) on 04/11/23 at 1452  Med List Status: Complete   Medication Order Taking? Sig Documenting Provider Last Dose Status Informant  atorvastatin (LIPITOR) 20 MG tablet [id number] Yes Take 1 tablet (20 mg total) by mouth daily at 12 noon. Storm Frisk, MD  04/10/2023 Active Spouse/Significant Other, Other  ciprofloxacin (CIPRO) 500 MG tablet [id number] Yes Take 1 tablet (500 mg total) by mouth every 12 (twelve) hours for 7 days. Bing Neighbors, NP 04/10/2023 Active Spouse/Significant Other, Other  famotidine (PEPCID) 20 MG tablet [id number] Yes Take 1 tablet (20 mg total) by mouth 2 (two) times daily. Storm Frisk, MD 04/10/2023 Active Spouse/Significant Other, Other  ferrous sulfate 325 (65 FE) MG tablet [id number] Yes Take 1 tablet (325 mg total) by mouth daily. Storm Frisk, MD 04/10/2023 Active Spouse/Significant Other, Other  metFORMIN (GLUCOPHAGE) 1000 MG tablet [id number] Yes Take 1 tablet (1,000 mg total) by mouth 2 (two) times daily with a meal. Storm Frisk, MD 04/10/2023 Active Spouse/Significant Other, Other  methocarbamol (ROBAXIN) 500 MG tablet [id number] Yes Take 1 tablet (500 mg total) by mouth every 6 (six) hours as needed for muscle spasms. Storm Frisk, MD 04/10/2023 Active Spouse/Significant Other, Other  sitaGLIPtin (JANUVIA) 100 MG tablet [id number] Yes Take 1 tablet (100 mg total) by mouth daily. Storm Frisk, MD 04/10/2023 Active Spouse/Significant Other, Other  valsartan-hydrochlorothiazide (DIOVAN-HCT) 320-25 MG tablet [id number] Yes Take 1 tablet by mouth daily. Storm Frisk, MD 04/10/2023 Active Spouse/Significant Other, Other  Vitamin D, Ergocalciferol, (DRISDOL) 1.25 MG (50000 UNIT) CAPS capsule [id number] Yes Take 1 capsule (50,000 Units total) by mouth every 7 (seven) days. Please get updated vit D level. Storm Frisk, MD Past Week Active Spouse/Significant Other, Other            Home Care and Equipment/Supplies: Were Home Health Services Ordered?: No Any new equipment or medical supplies ordered?: No  Functional Questionnaire: Do you need assistance with bathing/showering or dressing?: Yes (his  wife provides all necessary assistance.) Do you need assistance with meal preparation?: Yes Do  you need assistance with eating?: Yes Do you have difficulty maintaining continence: Yes (his wife catheterizes him and she said she has enough catheters.) Do you need assistance with getting out of bed/getting out of a chair/moving?: Yes (His wife assists with all ADLS.) Do you have difficulty managing or taking your medications?: Yes  Follow up appointments reviewed: PCP Follow-up appointment confirmed?: Yes Date of PCP follow-up appointment?: 04/27/23 Follow-up Provider: Dr [name] [facility] Follow-up appointment confirmed?: Yes Date of Specialist follow-up appointment?: 04/27/23 Follow-Up Specialty Provider:: infusion at the cancer center Do you need transportation to your follow-up appointment?: No (his wife drives) Do you understand care options if your condition(s) worsen?: Yes-patient verbalized understanding    SIGNATURE  Robyne Peers, RN

## 2023-04-19 NOTE — Transitions of Care (Post Inpatient/ED Visit) (Signed)
   04/19/2023  Name: John Meza MRN: [id number] DOB: August 16, 1961  Today's TOC FU Call Status: Today's TOC FU Call Status:: Unsuccessul Call (1st Attempt) Unsuccessful Call (1st Attempt) Date: 04/19/23  Attempted to reach the patient regarding the most recent Inpatient/ED visit.  Follow Up Plan: Additional outreach attempts will be made to reach the patient to complete the Transitions of Care (Post Inpatient/ED visit) call.   Signature  Robyne Peers, RN

## 2023-04-19 NOTE — Telephone Encounter (Signed)
JT-  the patient's wife said she submitted and [employer] and CAFA application about 2 months ago and has not heard anything. Can you please call her with an update on the status of the application?  Thanks

## 2023-04-21 NOTE — Telephone Encounter (Signed)
Message sent to [facility] Counselor requesting he contact patient's wife to update he on the status of the application for CAFA/OC

## 2023-04-22 NOTE — Telephone Encounter (Signed)
I spoke to [facility], Artist, and he said he will contact patient's wife about the CAFA/OC applications. He also noted that the patient has been covered under worker's comp

## 2023-04-23 ENCOUNTER — Other Ambulatory Visit: Payer: Self-pay

## 2023-04-23 ENCOUNTER — Other Ambulatory Visit: Payer: Self-pay | Admitting: Critical Care Medicine

## 2023-04-23 ENCOUNTER — Encounter: Payer: Self-pay | Admitting: Hematology

## 2023-04-23 ENCOUNTER — Encounter (HOSPITAL_COMMUNITY): Payer: Self-pay

## 2023-04-23 ENCOUNTER — Emergency Department (HOSPITAL_COMMUNITY): Payer: Medicaid Other

## 2023-04-23 ENCOUNTER — Inpatient Hospital Stay (HOSPITAL_COMMUNITY)
Admission: EM | Admit: 2023-04-23 | Discharge: 2023-05-05 | DRG: 853 | Disposition: A | Discharge status: Home Health | Payer: Medicaid Other | Attending: Family Medicine | Admitting: Family Medicine

## 2023-04-23 DIAGNOSIS — L89310 Pressure ulcer of right buttock, unstageable: Secondary | ICD-10-CM | POA: Diagnosis present

## 2023-04-23 DIAGNOSIS — M86651 Other chronic osteomyelitis, right thigh: Secondary | ICD-10-CM | POA: Diagnosis present

## 2023-04-23 DIAGNOSIS — L7632 Postprocedural hematoma of skin and subcutaneous tissue following other procedure: Secondary | ICD-10-CM | POA: Diagnosis not present

## 2023-04-23 DIAGNOSIS — L02415 Cutaneous abscess of right lower limb: Secondary | ICD-10-CM

## 2023-04-23 DIAGNOSIS — Z1152 Encounter for screening for COVID-19: Secondary | ICD-10-CM

## 2023-04-23 DIAGNOSIS — E8809 Other disorders of plasma-protein metabolism, not elsewhere classified: Secondary | ICD-10-CM | POA: Diagnosis present

## 2023-04-23 DIAGNOSIS — E1169 Type 2 diabetes mellitus with other specified complication: Secondary | ICD-10-CM | POA: Diagnosis present

## 2023-04-23 DIAGNOSIS — E872 Acidosis, unspecified: Secondary | ICD-10-CM | POA: Diagnosis present

## 2023-04-23 DIAGNOSIS — N390 Urinary tract infection, site not specified: Secondary | ICD-10-CM | POA: Diagnosis present

## 2023-04-23 DIAGNOSIS — L03115 Cellulitis of right lower limb: Secondary | ICD-10-CM | POA: Diagnosis present

## 2023-04-23 DIAGNOSIS — Z7401 Bed confinement status: Secondary | ICD-10-CM

## 2023-04-23 DIAGNOSIS — D509 Iron deficiency anemia, unspecified: Secondary | ICD-10-CM | POA: Diagnosis present

## 2023-04-23 DIAGNOSIS — D696 Thrombocytopenia, unspecified: Secondary | ICD-10-CM | POA: Diagnosis not present

## 2023-04-23 DIAGNOSIS — I10 Essential (primary) hypertension: Secondary | ICD-10-CM | POA: Diagnosis present

## 2023-04-23 DIAGNOSIS — R509 Fever, unspecified: Secondary | ICD-10-CM

## 2023-04-23 DIAGNOSIS — D72829 Elevated white blood cell count, unspecified: Secondary | ICD-10-CM

## 2023-04-23 DIAGNOSIS — R578 Other shock: Secondary | ICD-10-CM | POA: Diagnosis not present

## 2023-04-23 DIAGNOSIS — E559 Vitamin D deficiency, unspecified: Secondary | ICD-10-CM

## 2023-04-23 DIAGNOSIS — Z993 Dependence on wheelchair: Secondary | ICD-10-CM

## 2023-04-23 DIAGNOSIS — S7000XA Contusion of unspecified hip, initial encounter: Secondary | ICD-10-CM

## 2023-04-23 DIAGNOSIS — E860 Dehydration: Secondary | ICD-10-CM | POA: Diagnosis present

## 2023-04-23 DIAGNOSIS — Z833 Family history of diabetes mellitus: Secondary | ICD-10-CM

## 2023-04-23 DIAGNOSIS — D62 Acute posthemorrhagic anemia: Secondary | ICD-10-CM | POA: Diagnosis not present

## 2023-04-23 DIAGNOSIS — E785 Hyperlipidemia, unspecified: Secondary | ICD-10-CM | POA: Diagnosis present

## 2023-04-23 DIAGNOSIS — E669 Obesity, unspecified: Secondary | ICD-10-CM | POA: Diagnosis present

## 2023-04-23 DIAGNOSIS — Y848 Other medical procedures as the cause of abnormal reaction of the patient, or of later complication, without mention of misadventure at the time of the procedure: Secondary | ICD-10-CM | POA: Diagnosis not present

## 2023-04-23 DIAGNOSIS — R112 Nausea with vomiting, unspecified: Secondary | ICD-10-CM

## 2023-04-23 DIAGNOSIS — M009 Pyogenic arthritis, unspecified: Secondary | ICD-10-CM

## 2023-04-23 DIAGNOSIS — K651 Peritoneal abscess: Secondary | ICD-10-CM

## 2023-04-23 DIAGNOSIS — E871 Hypo-osmolality and hyponatremia: Secondary | ICD-10-CM | POA: Diagnosis present

## 2023-04-23 DIAGNOSIS — K592 Neurogenic bowel, not elsewhere classified: Secondary | ICD-10-CM | POA: Diagnosis present

## 2023-04-23 DIAGNOSIS — Z8619 Personal history of other infectious and parasitic diseases: Secondary | ICD-10-CM

## 2023-04-23 DIAGNOSIS — K219 Gastro-esophageal reflux disease without esophagitis: Secondary | ICD-10-CM | POA: Diagnosis present

## 2023-04-23 DIAGNOSIS — M869 Osteomyelitis, unspecified: Principal | ICD-10-CM

## 2023-04-23 DIAGNOSIS — Z79899 Other long term (current) drug therapy: Secondary | ICD-10-CM

## 2023-04-23 DIAGNOSIS — A419 Sepsis, unspecified organism: Principal | ICD-10-CM | POA: Diagnosis present

## 2023-04-23 DIAGNOSIS — N319 Neuromuscular dysfunction of bladder, unspecified: Secondary | ICD-10-CM | POA: Diagnosis present

## 2023-04-23 DIAGNOSIS — G8220 Paraplegia, unspecified: Secondary | ICD-10-CM | POA: Diagnosis present

## 2023-04-23 DIAGNOSIS — Z87891 Personal history of nicotine dependence: Secondary | ICD-10-CM

## 2023-04-23 DIAGNOSIS — Z597 Insufficient social insurance and welfare support: Secondary | ICD-10-CM

## 2023-04-23 DIAGNOSIS — E119 Type 2 diabetes mellitus without complications: Secondary | ICD-10-CM

## 2023-04-23 DIAGNOSIS — Z603 Acculturation difficulty: Secondary | ICD-10-CM | POA: Diagnosis present

## 2023-04-23 DIAGNOSIS — Z7984 Long term (current) use of oral hypoglycemic drugs: Secondary | ICD-10-CM

## 2023-04-23 DIAGNOSIS — Z87440 Personal history of urinary (tract) infections: Secondary | ICD-10-CM

## 2023-04-23 DIAGNOSIS — D649 Anemia, unspecified: Secondary | ICD-10-CM | POA: Diagnosis present

## 2023-04-23 LAB — APTT: aPTT: 42 seconds — ABNORMAL HIGH (ref 24–36)

## 2023-04-23 LAB — COMPREHENSIVE METABOLIC PANEL
ALT: 29 U/L (ref 0–44)
AST: 28 U/L (ref 15–41)
Albumin: 1.9 g/dL — ABNORMAL LOW (ref 3.5–5.0)
Alkaline Phosphatase: 117 U/L (ref 38–126)
Anion gap: 14 (ref 5–15)
BUN: 16 mg/dL (ref 6–20)
CO2: 20 mmol/L — ABNORMAL LOW (ref 22–32)
Calcium: 8.3 mg/dL — ABNORMAL LOW (ref 8.9–10.3)
Chloride: 96 mmol/L — ABNORMAL LOW (ref 98–111)
Creatinine, Ser: 1.2 mg/dL (ref 0.61–1.24)
GFR, Estimated: >60 mL/min (ref >60)
Glucose, Bld: 148 mg/dL — ABNORMAL HIGH (ref 70–99)
Potassium: 3.7 mmol/L (ref 3.5–5.1)
Sodium: 130 mmol/L — ABNORMAL LOW (ref 135–145)
Total Bilirubin: 0.6 mg/dL (ref 0.3–1.2)
Total Protein: 6.1 g/dL — ABNORMAL LOW (ref 6.5–8.1)

## 2023-04-23 LAB — CBC WITH DIFFERENTIAL/PLATELET
Abs Immature Granulocytes: 0.24 10*3/uL — ABNORMAL HIGH (ref 0.00–0.07)
Basophils Absolute: 0.1 10*3/uL (ref 0.0–0.1)
Basophils Relative: 0 %
Eosinophils Absolute: 0.1 10*3/uL (ref 0.0–0.5)
Eosinophils Relative: 0 %
HCT: 27.4 % — ABNORMAL LOW (ref 39.0–52.0)
Hemoglobin: 8.9 g/dL — ABNORMAL LOW (ref 13.0–17.0)
Immature Granulocytes: 1 %
Lymphocytes Relative: 7 %
Lymphs Abs: 1.6 10*3/uL (ref 0.7–4.0)
MCH: 27.9 pg (ref 26.0–34.0)
MCHC: 32.5 g/dL (ref 30.0–36.0)
MCV: 85.9 fL (ref 80.0–100.0)
Monocytes Absolute: 2.2 10*3/uL — ABNORMAL HIGH (ref 0.1–1.0)
Monocytes Relative: 9 %
Neutro Abs: 19.3 10*3/uL — ABNORMAL HIGH (ref 1.7–7.7)
Neutrophils Relative %: 83 %
Platelets: 463 10*3/uL — ABNORMAL HIGH (ref 150–400)
RBC: 3.19 MIL/uL — ABNORMAL LOW (ref 4.22–5.81)
RDW: 14.5 % (ref 11.5–15.5)
WBC: 23.4 10*3/uL — ABNORMAL HIGH (ref 4.0–10.5)
nRBC: 0 % (ref 0.0–0.2)

## 2023-04-23 LAB — GLUCOSE, CAPILLARY
Glucose-Capillary: 120 mg/dL — ABNORMAL HIGH (ref 70–99)
Glucose-Capillary: 137 mg/dL — ABNORMAL HIGH (ref 70–99)
Glucose-Capillary: 127 mg/dL — ABNORMAL HIGH (ref 70–99)

## 2023-04-23 LAB — URINALYSIS, W/ REFLEX TO CULTURE (INFECTION SUSPECTED)
Bilirubin Urine: NEGATIVE
Glucose, UA: NEGATIVE mg/dL
Hgb urine dipstick: NEGATIVE
Ketones, ur: NEGATIVE mg/dL
Nitrite: NEGATIVE
Protein, ur: >=300 mg/dL — AB
Specific Gravity, Urine: 1.014 (ref 1.005–1.030)
pH: 5 (ref 5.0–8.0)

## 2023-04-23 LAB — RESP PANEL BY RT-PCR (RSV, FLU A&B, COVID)  RVPGX2
Influenza A by PCR: NEGATIVE
Influenza B by PCR: NEGATIVE
Resp Syncytial Virus by PCR: NEGATIVE
SARS Coronavirus 2 by RT PCR: NEGATIVE

## 2023-04-23 LAB — PROTIME-INR
INR: 1.3 — ABNORMAL HIGH (ref 0.8–1.2)
Prothrombin Time: 16.6 seconds — ABNORMAL HIGH (ref 11.4–15.2)

## 2023-04-23 LAB — LACTIC ACID, PLASMA
Lactic Acid, Venous: 2 mmol/L (ref 0.5–1.9)
Lactic Acid, Venous: 1.4 mmol/L (ref 0.5–1.9)

## 2023-04-23 LAB — PROCALCITONIN: Procalcitonin: 1.38 ng/mL

## 2023-04-23 LAB — I-STAT CG4 LACTIC ACID, ED
Lactic Acid, Venous: 1.9 mmol/L (ref 0.5–1.9)
Lactic Acid, Venous: 3.5 mmol/L (ref 0.5–1.9)
Lactic Acid, Venous: 1.9 mmol/L (ref 0.5–1.9)

## 2023-04-23 MED ORDER — IRBESARTAN 150 MG PO TABS
300.0000 mg | ORAL_TABLET | Freq: Every day | ORAL | Status: DC
  Administered 2023-04-23 – 2023-04-29 (×6): 300 mg via ORAL
  Filled 2023-04-23 (×7): qty 2

## 2023-04-23 MED ORDER — LACTATED RINGERS IV BOLUS (SEPSIS)
1000.0000 mL | Freq: Once | INTRAVENOUS | Status: AC
  Administered 2023-04-23: 1000 mL via INTRAVENOUS

## 2023-04-23 MED ORDER — HYDROCHLOROTHIAZIDE 25 MG PO TABS
25.0000 mg | ORAL_TABLET | Freq: Every day | ORAL | Status: DC
  Administered 2023-04-23 – 2023-04-24 (×2): 25 mg via ORAL
  Filled 2023-04-23 (×2): qty 1

## 2023-04-23 MED ORDER — INSULIN ASPART 100 UNIT/ML IJ SOLN
0.0000 [IU] | Freq: Every day | INTRAMUSCULAR | Status: DC
  Administered 2023-04-24 – 2023-04-29 (×6): 2 [IU] via SUBCUTANEOUS

## 2023-04-23 MED ORDER — CHLORHEXIDINE GLUCONATE CLOTH 2 % EX PADS
6.0000 | MEDICATED_PAD | Freq: Every day | CUTANEOUS | Status: DC
  Administered 2023-04-23 – 2023-05-01 (×9): 6 via TOPICAL

## 2023-04-23 MED ORDER — SODIUM CHLORIDE 0.9 % IV SOLN: 2.0000 g | Freq: Once | INTRAVENOUS | Status: DC

## 2023-04-23 MED ORDER — POLYETHYLENE GLYCOL 3350 17 G PO PACK: 17.0000 g | PACK | Freq: Every day | ORAL | Status: DC | PRN

## 2023-04-23 MED ORDER — MEDIHONEY WOUND/BURN DRESSING EX PSTE
1.0000 | PASTE | Freq: Every day | CUTANEOUS | Status: DC
  Administered 2023-04-23 – 2023-05-05 (×12): 1 via TOPICAL
  Filled 2023-04-23 (×3): qty 44

## 2023-04-23 MED ORDER — LACTATED RINGERS IV SOLN: INTRAVENOUS | Status: DC

## 2023-04-23 MED ORDER — ADULT MULTIVITAMIN W/MINERALS CH
1.0000 | ORAL_TABLET | Freq: Every day | ORAL | Status: DC
  Administered 2023-04-23 – 2023-04-30 (×7): 1 via ORAL
  Filled 2023-04-23 (×7): qty 1

## 2023-04-23 MED ORDER — ACETAMINOPHEN 650 MG RE SUPP: 650.0000 mg | Freq: Four times a day (QID) | RECTAL | Status: DC | PRN

## 2023-04-23 MED ORDER — ENOXAPARIN SODIUM 40 MG/0.4ML IJ SOSY
40.0000 mg | PREFILLED_SYRINGE | INTRAMUSCULAR | Status: DC
  Administered 2023-04-23 – 2023-05-05 (×11): 40 mg via SUBCUTANEOUS
  Filled 2023-04-23 (×11): qty 0.4

## 2023-04-23 MED ORDER — ONDANSETRON HCL 4 MG/2ML IJ SOLN
4.0000 mg | Freq: Once | INTRAMUSCULAR | Status: AC
  Administered 2023-04-23: 4 mg via INTRAVENOUS
  Filled 2023-04-23: qty 2

## 2023-04-23 MED ORDER — BISACODYL 5 MG PO TBEC: 5.0000 mg | DELAYED_RELEASE_TABLET | Freq: Every day | ORAL | Status: DC | PRN

## 2023-04-23 MED ORDER — JUVEN PO PACK
1.0000 | PACK | Freq: Two times a day (BID) | ORAL | Status: DC
  Administered 2023-04-23 – 2023-05-05 (×19): 1 via ORAL
  Filled 2023-04-23 (×19): qty 1

## 2023-04-23 MED ORDER — ONDANSETRON HCL 4 MG/2ML IJ SOLN: 4.0000 mg | Freq: Four times a day (QID) | INTRAMUSCULAR | Status: DC | PRN

## 2023-04-23 MED ORDER — ATORVASTATIN CALCIUM 10 MG PO TABS
20.0000 mg | ORAL_TABLET | Freq: Every day | ORAL | Status: DC
  Administered 2023-04-23 – 2023-04-30 (×7): 20 mg via ORAL
  Filled 2023-04-23 (×9): qty 2

## 2023-04-23 MED ORDER — VALSARTAN-HYDROCHLOROTHIAZIDE 320-25 MG PO TABS: 1.0000 | ORAL_TABLET | Freq: Every day | ORAL | Status: DC

## 2023-04-23 MED ORDER — INSULIN ASPART 100 UNIT/ML IJ SOLN
0.0000 [IU] | Freq: Three times a day (TID) | INTRAMUSCULAR | Status: DC
  Administered 2023-04-23: 2 [IU] via SUBCUTANEOUS
  Administered 2023-04-24: 3 [IU] via SUBCUTANEOUS
  Administered 2023-04-24 – 2023-04-25 (×2): 2 [IU] via SUBCUTANEOUS
  Administered 2023-04-25 – 2023-04-28 (×8): 3 [IU] via SUBCUTANEOUS
  Administered 2023-04-28 – 2023-04-29 (×3): 5 [IU] via SUBCUTANEOUS
  Administered 2023-04-29: 3 [IU] via SUBCUTANEOUS
  Administered 2023-04-30 – 2023-05-01 (×2): 8 [IU] via SUBCUTANEOUS
  Administered 2023-05-02: 2 [IU] via SUBCUTANEOUS
  Administered 2023-05-02 – 2023-05-03 (×3): 3 [IU] via SUBCUTANEOUS
  Administered 2023-05-04 – 2023-05-05 (×4): 2 [IU] via SUBCUTANEOUS

## 2023-04-23 MED ORDER — GLUCERNA SHAKE PO LIQD
237.0000 mL | Freq: Two times a day (BID) | ORAL | Status: DC
  Administered 2023-04-26 – 2023-05-05 (×12): 237 mL via ORAL

## 2023-04-23 MED ORDER — DOCUSATE SODIUM 100 MG PO CAPS
100.0000 mg | ORAL_CAPSULE | Freq: Two times a day (BID) | ORAL | Status: DC
  Administered 2023-04-23 – 2023-04-30 (×9): 100 mg via ORAL
  Filled 2023-04-23 (×14): qty 1

## 2023-04-23 MED ORDER — SODIUM CHLORIDE 0.9% FLUSH
3.0000 mL | Freq: Two times a day (BID) | INTRAVENOUS | Status: DC
  Administered 2023-04-23 – 2023-05-05 (×19): 3 mL via INTRAVENOUS

## 2023-04-23 MED ORDER — HYDRALAZINE HCL 20 MG/ML IJ SOLN: 5.0000 mg | INTRAMUSCULAR | Status: DC | PRN

## 2023-04-23 MED ORDER — FAMOTIDINE 20 MG PO TABS
20.0000 mg | ORAL_TABLET | Freq: Two times a day (BID) | ORAL | Status: DC
  Administered 2023-04-23 – 2023-04-30 (×15): 20 mg via ORAL
  Filled 2023-04-23 (×15): qty 1

## 2023-04-23 MED ORDER — ACETAMINOPHEN 325 MG PO TABS
650.0000 mg | ORAL_TABLET | Freq: Four times a day (QID) | ORAL | Status: DC | PRN
  Administered 2023-04-23 – 2023-04-26 (×4): 650 mg via ORAL
  Filled 2023-04-23 (×6): qty 2

## 2023-04-23 MED ORDER — SODIUM CHLORIDE 0.9 % IV SOLN
1.0000 g | Freq: Once | INTRAVENOUS | Status: AC
  Administered 2023-04-23: 1 g via INTRAVENOUS
  Filled 2023-04-23: qty 20

## 2023-04-23 MED ORDER — ONDANSETRON HCL 4 MG PO TABS
4.0000 mg | ORAL_TABLET | Freq: Four times a day (QID) | ORAL | Status: DC | PRN
  Administered 2023-05-03: 4 mg via ORAL
  Filled 2023-04-23: qty 1

## 2023-04-23 NOTE — Plan of Care (Signed)
  Problem: Education: Goal: Knowledge of General Education information will improve Description: Including pain rating scale, medication(s)/side effects and non-pharmacologic comfort measures Outcome: Progressing   Problem: Health Behavior/Discharge Planning: Goal: Ability to manage health-related needs will improve Outcome: Progressing   Problem: Clinical Measurements: Goal: Cardiovascular complication will be avoided Outcome: Progressing   Problem: Activity: Goal: Risk for activity intolerance will decrease Outcome: Progressing   Problem: Elimination: Goal: Will not experience complications related to urinary retention Outcome: Progressing   Problem: Pain Managment: Goal: General experience of comfort will improve Outcome: Progressing   Problem: Safety: Goal: Ability to remain free from injury will improve Outcome: Progressing   Problem: Skin Integrity: Goal: Risk for impaired skin integrity will decrease Outcome: Progressing

## 2023-04-23 NOTE — H&P (Signed)
History and Physical    Patient: John Meza ZOX:[id number] DOB: 1961/08/29 DOA: 04/23/2023 DOS: the patient was seen and examined on 04/23/2023 PCP: Storm Frisk, MD  Patient coming from: Home - lives with wife and daughters; NOK: Wife, Evangeline Dakin, [phone]    Chief Complaint: fever  HPI: Teren Nhan is a 62 y.o. male with medical history significant of paraplegia with h/o neurogenic bladder requiring self-cath and recurrent ESBL UTIs, DM, HTN, and chronic R buttock pressure ulcer presenting with fever. He was last hospitalized from 6/30-7/4 for complicated UTI due to klebsiella pneumoniae and Morganella Morganii and was discharged on Cipro through 7/11.  History obtained through [employer] video interpreter but still with poor history (acknowledged by interpreter that answers to questions were incomplete).  Patient and wife report that he has had fever to "111" (100.9 here so possibly 101) with n/v.  They report that the buttock wound is healing well.  He has continued to self-cath, although foley was placed in the ER.  No other specific symptoms.    ER Course:  Carryover, per Dr. Margo Aye:  Recently admitted on June 30 and DC'd on 7/11 for complicated UTI.  + nausea/vomiting since his discharge from the hospital.  WBC 26,000.  Also has lactic acidosis 3.0.  UA is positive for pyuria.  With history of ESBL UTI he was started on Merrem.       Review of Systems: As mentioned in the history of present illness. All other systems reviewed and are negative. Past Medical History:  Diagnosis Date   Cellulitis and abscess of buttock 10/2016   Diabetes mellitus    ESBL (extended spectrum beta-lactamase) producing bacteria infection 03/16/2022   Hyperkalemia 03/16/2022   Hypertension    Myositis 04/21/2022   Paraplegia (HCC) 2013   fell from ladder   Pressure ulcer, buttock, right, unstageable (HCC) 12/30/2020   Pyelonephritis due to Escherichia coli 03/16/2022    Sacral decubitus ulcer, stage IV (HCC) 11/03/2021   SCI (spinal cord injury)    Sepsis due to Escherichia coli (E. coli) (HCC) 03/01/2022   Spine fracture 11/16/2011   T 11- T9-L1   Past Surgical History:  Procedure Laterality Date   CHOLECYSTECTOMY N/A 05/01/2015   Procedure: LAPAROSCOPIC CHOLECYSTECTOMY WITH ATTEMPTED INTRAOPERATIVE CHOLANGIOGRAM;  Surgeon: Violeta Gelinas, MD;  Location: [facility] OR;  Service: General;  Laterality: N/A;   SPINE SURGERY     TEE WITHOUT CARDIOVERSION N/A 03/26/2022   Procedure: TRANSESOPHAGEAL ECHOCARDIOGRAM (TEE);  Surgeon: Elease Hashimoto Deloris Ping, MD;  Location: [facility] ENDOSCOPY;  Service: Cardiovascular;  Laterality: N/A;   Social History:  reports that he quit smoking about 11 years ago. His smoking use included cigarettes. He started smoking about 16 years ago. He has a 5 pack-year smoking history. He has never used smokeless tobacco. He reports that he does not currently use alcohol after a past usage of about 1.0 standard drink of alcohol per week. He reports that he does not use drugs.  Allergies  Allergen Reactions   Macrobid [Nitrofurantoin] Itching and Rash    Family History  Problem Relation Age of Onset   Healthy Mother    Diabetes Father    Diabetes Sister     Prior to Admission medications   Medication Sig Start Date End Date Taking? Authorizing Provider  acetaminophen (TYLENOL) 325 MG tablet Take 2 tablets (650 mg total) by mouth every 6 (six) hours as needed for mild pain, fever or headache. 04/15/23   Lonia Blood, MD  atorvastatin (  LIPITOR) 20 MG tablet Take 1 tablet (20 mg total) by mouth daily at 12 noon. 12/01/22   Storm Frisk, MD  ciprofloxacin (CIPRO) 500 MG tablet Take 1 tablet (500 mg total) by mouth 2 (two) times daily. 04/15/23   Lonia Blood, MD  famotidine (PEPCID) 20 MG tablet Take 1 tablet (20 mg total) by mouth 2 (two) times daily. 12/01/22   Storm Frisk, MD  ferrous sulfate 325 (65 FE) MG tablet Take 1 tablet (325 mg  total) by mouth daily. 12/01/22   Storm Frisk, MD  metFORMIN (GLUCOPHAGE) 1000 MG tablet Take 1 tablet (1,000 mg total) by mouth 2 (two) times daily with a meal. 12/01/22   Storm Frisk, MD  sitaGLIPtin (JANUVIA) 100 MG tablet Take 1 tablet (100 mg total) by mouth daily. 12/01/22   Storm Frisk, MD  valsartan-hydrochlorothiazide (DIOVAN-HCT) 320-25 MG tablet Take 1 tablet by mouth daily. 12/01/22   Storm Frisk, MD  Vitamin D, Ergocalciferol, (DRISDOL) 1.25 MG (50000 UNIT) CAPS capsule Take 1 capsule (50,000 Units total) by mouth every 7 (seven) days. Please get updated vit D level. 03/03/23   Storm Frisk, MD  loratadine (CLARITIN) 10 MG tablet Take 1 tablet (10 mg total) by mouth daily. As needed for itchy throat/allergy symptoms Patient not taking: Reported on 03/24/2019 12/26/18 07/05/20  Cain Saupe, MD    Physical Exam: Vitals:   04/23/23 0630 04/23/23 0645 04/23/23 0700 04/23/23 0715  BP: 120/71 115/67 124/69 111/68  Pulse: (!) 117 (!) 120 (!) 126 (!) 127  Resp: (!) 24 (!) 24 18 (!) 21  Temp:  99.7 F (37.6 C)    SpO2: 100% 97% 98% 96%   General:  Appears calm and comfortable and is in NAD Eyes:   EOMI, normal lids, iris ENT:  grossly normal hearing, lips & tongue, mildly dry mm; poor dentition Neck:  no LAD, masses or thyromegaly Cardiovascular:  RR with tachycardia. No LE edema.  Respiratory:   CTA bilaterally with no wheezes/rales/rhonchi.  Mildly increased respiratory effort. Abdomen:  soft, NT, ND Skin:  no rash or induration seen on limited exam Musculoskeletal:  diffuse muscular atrophy Psychiatric: blunted mood and affect, speech sparse but appropriate, AOx3 Neurologic:  CN 2-12 grossly intact, paraplegic   Radiological Exams on Admission: Independently reviewed - see discussion in A/P where applicable  DG Chest Port 1 View  Result Date: 04/23/2023 CLINICAL DATA:  62 year old male with possible sepsis. EXAM: PORTABLE CHEST 1 VIEW COMPARISON:   Chest x-ray 04/13/2023. FINDINGS: Lung volumes are low. No consolidative airspace disease. No pleural effusions. No pneumothorax. No pulmonary nodule or mass noted. Pulmonary vasculature and the cardiomediastinal silhouette are within normal limits. Orthopedic fixation hardware in the lower thoracic and lumbar spine partially imaged. Surgical clips project over the right upper quadrant of the abdomen, likely from prior cholecystectomy. IMPRESSION: 1. Low lung volumes without radiographic evidence of acute cardiopulmonary disease. Electronically Signed   By: Trudie Reed M.D.   On: 04/23/2023 05:24    EKG: Independently reviewed.  Sinus tachycardia with rate 140; nonspecific ST changes with no evidence of acute ischemia   Labs on Admission: I have personally reviewed the available labs and imaging studies at the time of the admission.  Pertinent labs:    Na++ 130 Glucose 148 Albumin 1.9 Lactate 2.0, 1.9, 3.5, 1.9 WBC 23.4 Hgb 8.9 Platelets 463 COVID/flu/RSV negative UA: trace LE, ?300 protein, rare bacteria Blood and urine cultures pending   Assessment and  Plan: Principal Problem:   Sepsis (HCC) Active Problems:   Controlled type 2 diabetes mellitus (HCC)   Paraplegia (HCC)   Hyponatremia   Essential hypertension   Complicated UTI (urinary tract infection)   Normocytic anemia   Hyperlipidemia associated with type 2 diabetes mellitus (HCC)   Sepsis -SIRS criteria in this patient includes: Leukocytosis, fever, tachycardia, tachypnea  -Patient has evidence of acute organ failure with elevated lactate >2 that is not easily explained by another condition. -While awaiting blood cultures, this appears to be a preseptic condition. -Sepsis protocol initiated -Suspected source is currently uncertain but he has h/o recurrent UTI so possible UTI (see below) -Blood and urine cultures pending -Will admit due to failure of outpatient treatment -He was given Meropenem in the ER; will hold  further abx pending ID consultation -Lactate has cleared -Will order procalcitonin level.   Recurrent Complicated UTI -Patient was previously hospitalized with ESBL E coli UTI in 11/2019 and last week with Klebsiella and Morganella UTI -Situation is complicated due to neurogenic bladder requiring self-cath -UA today is not overly suggestive of UTI but this could indicate partially treated infection -Will await urine culture and ID input -Currently with foley   DM -Recent A1c was 7, indicating good control -hold Glucophage, Januvia -Cover with moderate-scale SSI    HTN -Continue valsartan-hydrochlorothiazide - will resume tomorrow   HLD -Continue Lipitor   Paraplegia -He is wheelchair mobile -Neurogenic bowel/bladder -PT/OT consults  Chronic R Buttock Pressure Ulcer -Continue wound care with Medihoney daily covered with foam dressing  -Wound care consulted  Chronic hyponatremia -Na++ is 130, which is his relative baseline -Recheck BMP in AM  Normocytic anemia -Appears to be stable at this time -Recheck CBC in AM  Obesity -Weight loss should be encouraged -Outpatient PCP/bariatric medicine f/u encouraged  -This is complicated by his inability to mobilize in order to burn calories -Nutrition consult    Advance Care Planning:   Code Status: Full Code - Code status was discussed with the patient and/or family at the time of admission.  The patient would want to receive full resuscitative measures at this time.   Consults: ID; PT/OT; nutrition; TOC team  DVT Prophylaxis: Lovenox  Family Communication: Wife was present throughout evaluation  Severity of Illness: The appropriate patient status for this patient is INPATIENT. Inpatient status is judged to be reasonable and necessary in order to provide the required intensity of service to ensure the patient's safety. The patient's presenting symptoms, physical exam findings, and initial radiographic and laboratory data in  the context of their chronic comorbidities is felt to place them at high risk for further clinical deterioration. Furthermore, it is not anticipated that the patient will be medically stable for discharge from the hospital within 2 midnights of admission.   * I certify that at the point of admission it is my clinical judgment that the patient will require inpatient hospital care spanning beyond 2 midnights from the point of admission due to high intensity of service, high risk for further deterioration and high frequency of surveillance required.*  Author: Jonah Blue, MD 04/23/2023 8:21 AM  For on call review [email or web address].

## 2023-04-23 NOTE — Consult Note (Addendum)
[facility]       Reason for Consult: fever    Referring Physician: Dr. Ophelia Charter  Principal Problem:   Sepsis [facility]) Active Problems:   Controlled type 2 diabetes mellitus (HCC)   Paraplegia (HCC)   Hyponatremia   Essential hypertension   Complicated UTI (urinary tract infection)   Normocytic anemia   Hyperlipidemia associated with type 2 diabetes mellitus (HCC)    atorvastatin  20 mg Oral Q1200   docusate sodium  100 mg Oral BID   enoxaparin (LOVENOX) injection  40 mg Subcutaneous Q24H   famotidine  20 mg Oral BID   hydrochlorothiazide  25 mg Oral Daily   insulin aspart  0-15 Units Subcutaneous TID WC   insulin aspart  0-5 Units Subcutaneous QHS   irbesartan  300 mg Oral Daily   leptospermum manuka honey  1 Application Topical Daily   sodium chloride flush  3 mL Intravenous Q12H    Recommendations: Continue to monitor off of antibiotics hydration and continue with supportive care  Assessment: He has n/v that started soon after leaving the hospital.  Unclear etiology but improved with supportive care.  May be secondary to cipro, which he started after discharge, viral etiology or other medication.  With fever and leukocytosis, viral syndrome more likely.  Lactate improved with fluid resuscitation and he remains on blood pressure medications so elevated lactate likely from dehydration.    HPI: John Meza is a 62 y.o. male with a history of paraplegia with neurogenic bladder and intermittent self-cath here with n/v.  Developed nausea and vomiting with poor po intake soon after leaving the hospital.  Has noted acid reflux.  He has had a lot of dry heaving.  Poor liquid intake as well.  States fever was 110 (was unable to clarify).  Feels better now since admission.  Able to eat some this am.  No current nausea.  Wife at bedside and assists with history.  UA with protein and some WBCs.  + leukocytosis, + fever to 100.6.     Review of  Systems:  Constitutional: positive for fevers All other systems reviewed and are negative    Past Medical History:  Diagnosis Date   Cellulitis and abscess of buttock 10/2016   Diabetes mellitus    ESBL (extended spectrum beta-lactamase) producing bacteria infection 03/16/2022   Hyperkalemia 03/16/2022   Hypertension    Myositis 04/21/2022   Paraplegia (HCC) 2013   fell from ladder   Pressure ulcer, buttock, right, unstageable (HCC) 12/30/2020   Pyelonephritis due to Escherichia coli 03/16/2022   Sacral decubitus ulcer, stage IV (HCC) 11/03/2021   SCI (spinal cord injury)    Sepsis due to Escherichia coli (E. coli) (HCC) 03/01/2022   Spine fracture 11/16/2011   T 11- T9-L1    Social History   Tobacco Use   Smoking status: Former    Current packs/day: 0.00    Average packs/day: 1 pack/day for 5.0 years (5.0 ttl pk-yrs)    Types: Cigarettes    Start date: 10/12/2006    Quit date: 10/13/2011    Years since quitting: 11.5   Smokeless tobacco: Never   Tobacco comments:    03-24-19 per pt he stopped 1 mo ago   Vaping Use   Vaping status: Never Used  Substance Use Topics   Alcohol use: Not Currently    Alcohol/week: 1.0 standard drink of alcohol    Types: 1 Cans of beer per week   Drug use:  Never    Family History  Problem Relation Age of Onset   Healthy Mother    Diabetes Father    Diabetes Sister     Allergies  Allergen Reactions   Macrobid [Nitrofurantoin] Itching and Rash    Physical Exam: Constitutional: in no apparent distress  Vitals:   04/23/23 0715 04/23/23 0825  BP: 111/68 118/63  Pulse: (!) 127 (!) 123  Resp: (!) 21 20  Temp:  99.5 F (37.5 C)  SpO2: 96% 98%   EYES: anicteric Respiratory: normal respiratory effort Musculoskeletal: no edema Skin: no rash  Lab Results  Component Value Date   WBC 23.4 (H) 04/23/2023   HGB 8.9 (L) 04/23/2023   HCT 27.4 (L) 04/23/2023   MCV 85.9 04/23/2023   PLT 463 (H) 04/23/2023    Lab Results  Component Value Date    CREATININE 1.20 04/23/2023   BUN 16 04/23/2023   NA 130 (L) 04/23/2023   K 3.7 04/23/2023   CL 96 (L) 04/23/2023   CO2 20 (L) 04/23/2023    Lab Results  Component Value Date   ALT 29 04/23/2023   AST 28 04/23/2023   ALKPHOS 117 04/23/2023     Microbiology: Recent Results (from the past 240 hour(s))  Resp panel by RT-PCR (RSV, Flu A&B, Covid) Anterior Nasal Swab     Status: None   Collection Time: 04/23/23  4:37 AM   Specimen: Anterior Nasal Swab  Result Value Ref Range Status   SARS Coronavirus 2 by RT PCR NEGATIVE NEGATIVE Final   Influenza A by PCR NEGATIVE NEGATIVE Final   Influenza B by PCR NEGATIVE NEGATIVE Final    Comment: (NOTE) The Xpert Xpress SARS-CoV-2/FLU/RSV plus assay is intended as an aid in the diagnosis of influenza from Nasopharyngeal swab specimens and should not be used as a sole basis for treatment. Nasal washings and aspirates are unacceptable for Xpert Xpress SARS-CoV-2/FLU/RSV testing.  Fact Sheet for Patients: BloggerCourse.com  Fact Sheet for Healthcare Providers: SeriousBroker.it  This test is not yet approved or cleared by the [place] FDA and has been authorized for detection and/or diagnosis of SARS-CoV-2 by FDA under an Emergency Use Authorization (EUA). This EUA will remain in effect (meaning this test can be used) for the duration of the COVID-19 declaration under Section 564(b)(1) of the Act, 21 U.S.C. section 360bbb-3(b)(1), unless the authorization is terminated or revoked.     Resp Syncytial Virus by PCR NEGATIVE NEGATIVE Final    Comment: (NOTE) Fact Sheet for Patients: BloggerCourse.com  Fact Sheet for Healthcare Providers: SeriousBroker.it  This test is not yet approved or cleared by the [place] FDA and has been authorized for detection and/or diagnosis of SARS-CoV-2 by FDA under an Emergency Use Authorization  (EUA). This EUA will remain in effect (meaning this test can be used) for the duration of the COVID-19 declaration under Section 564(b)(1) of the Act, 21 U.S.C. section 360bbb-3(b)(1), unless the authorization is terminated or revoked.  Performed at [facility], [address], [place], [place] [address]   Blood Culture (routine x 2)     Status: None (Preliminary result)   Collection Time: 04/23/23  4:39 AM   Specimen: BLOOD LEFT ARM  Result Value Ref Range Status   Specimen Description BLOOD LEFT ARM  Final   Special Requests   Final    BOTTLES DRAWN AEROBIC AND ANAEROBIC Blood Culture results may not be optimal due to an excessive volume of blood received in culture bottles  Culture   Final    NO GROWTH < 12 HOURS Performed at [facility], [address], [place], [place] [address]    Report Status PENDING  Incomplete  Blood Culture (routine x 2)     Status: None (Preliminary result)   Collection Time: 04/23/23  4:50 AM   Specimen: BLOOD  Result Value Ref Range Status   Specimen Description BLOOD SITE NOT SPECIFIED  Final   Special Requests   Final    BOTTLES DRAWN AEROBIC AND ANAEROBIC Blood Culture results may not be optimal due to an excessive volume of blood received in culture bottles   Culture   Final    NO GROWTH < 12 HOURS Performed at [facility], [address], [place], [place] [address]    Report Status PENDING  Incomplete    Gardiner Barefoot, MD [facility] [email or web address] 04/23/2023, 10:35 AM

## 2023-04-23 NOTE — Progress Notes (Signed)
MEWS Progress Note  Patient Details Name: John Meza MRN: [id number] DOB: 18-Jun-1961 Today's Date: 04/23/2023   MEWS Flowsheet Documentation:  Assess: MEWS Score Temp: (!) 101.1 F (38.4 C) BP: 139/66 MAP (mmHg): 84 Pulse Rate: (!) 124 ECG Heart Rate: (!) 125 Resp: 20 Level of Consciousness: Alert SpO2: 97 % O2 Device: Room Air Assess: MEWS Score MEWS Temp: 1 MEWS Systolic: 0 MEWS Pulse: 2 MEWS RR: 0 MEWS LOC: 0 MEWS Score: 3 MEWS Score Color: Yellow Assess: SIRS CRITERIA SIRS Temperature : 1 SIRS Respirations : 0 SIRS Pulse: 1 SIRS WBC: 0 SIRS Score Sum : 2 Assess: if the MEWS score is Yellow or Red Were vital signs taken at a resting state?: Yes Focused Assessment: No change from prior assessment Does the patient meet 2 or more of the SIRS criteria?: Yes Does the patient have a confirmed or suspected source of infection?: Yes MEWS guidelines implemented : Yes, yellow Treat MEWS Interventions: Considered administering scheduled or prn medications/treatments as ordered Take Vital Signs Increase Vital Sign Frequency : Yellow: Q2hr x1, continue Q4hrs until patient remains green for 12hrs Escalate MEWS: Escalate: Yellow: Discuss with charge nurse and consider notifying provider and/or RRT Notify: Charge Nurse/RN Name of Charge Nurse/RN Notified: Sales executive, RN Provider Notification Provider Name/Title:  (Provider aware) Date Provider Notified: 04/23/23 Time Provider Notified: [phone] Test performed and critical result: Lactic acid 2.0 Date Critical Result Received: 04/23/23 Time Critical Result Received: 0621 Provider response: In department Date of Provider Response: 04/23/23 Time of Provider Response: Alric Quan 04/23/2023, 8:19 PM

## 2023-04-23 NOTE — Consult Note (Signed)
WOC Nurse Consult Note: Reason for Consult:Unstageable pressure injury to right buttock. Seen by my associate during last admission on 04/12/23. Wound type:pressure Pressure Injury POA: Yes Measurement:3cm x 3cm x 0.2cm  Wound bed:90% yellow, 10% red Drainage (amount, consistency, odor) small serous to light yellow Periwound:intact Dressing procedure/placement/frequency:I have provided guidance for nursing  for the once daily cleanse of the lesion using Vashe (pure hypochlorous acid) with a 10 minute soak of the solution prior to drying followed by application of MediHoney (leptospermum Manuka honey). This is to be topped with dry gauze and covered with a silicone foam and changed daily  WOC nursing team will not follow, but will remain available to this patient, the nursing and medical teams.  Please re-consult if needed.  Thank you for inviting [place] to participate in this patient's Plan of Care.  Ladona Mow, MSN, RN, CNS, GNP, Leda Min, [employer], [employer] phone:  [phone]

## 2023-04-23 NOTE — ED Provider Notes (Signed)
[place] EMERGENCY DEPARTMENT AT [facility] Provider Note   CSN: [id number] Arrival date & time: 04/23/23  0410     History  No chief complaint on file.   John Meza is a 62 y.o. male.  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  Patient was recently hospitalized from June 30 through July 4 due to sepsis thought to be due to complicated UTI along with other problems.  Patient was treated with IV antibiotics and discharged home on a course of Cipro.  Urine culture had grown Klebsiella pneumonia and Morganella Morganii, sensitive to ciprofloxacin.  Patient completed the course of antibiotics on July 11.  He states he is continued to have nausea, vomiting, and has had increased chills and feels feverish.  He denies abdominal pain, chest pain, shortness of breath.  He denies any changes to his chronic buttock wound.  He self caths 3 times daily.  Upon arrival patient is noted to be tachycardic and febrile and code sepsis is activated along with associated orders.  Spanish language interpreter used throughout encounter  HPI     Home Medications Prior to Admission medications   Medication Sig Start Date End Date Taking? Authorizing Provider  acetaminophen (TYLENOL) 325 MG tablet Take 2 tablets (650 mg total) by mouth every 6 (six) hours as needed for mild pain, fever or headache. 04/15/23   Lonia Blood, MD  atorvastatin (LIPITOR) 20 MG tablet Take 1 tablet (20 mg total) by mouth daily at 12 noon. 12/01/22   Storm Frisk, MD  ciprofloxacin (CIPRO) 500 MG tablet Take 1 tablet (500 mg total) by mouth 2 (two) times daily. 04/15/23   Lonia Blood, MD  famotidine (PEPCID) 20 MG tablet Take 1 tablet (20 mg total) by mouth 2 (two) times daily. 12/01/22   Storm Frisk, MD  ferrous sulfate 325 (65 FE)  MG tablet Take 1 tablet (325 mg total) by mouth daily. 12/01/22   Storm Frisk, MD  metFORMIN (GLUCOPHAGE) 1000 MG tablet Take 1 tablet (1,000 mg total) by mouth 2 (two) times daily with a meal. 12/01/22   Storm Frisk, MD  sitaGLIPtin (JANUVIA) 100 MG tablet Take 1 tablet (100 mg total) by mouth daily. 12/01/22   Storm Frisk, MD  valsartan-hydrochlorothiazide (DIOVAN-HCT) 320-25 MG tablet Take 1 tablet by mouth daily. 12/01/22   Storm Frisk, MD  Vitamin D, Ergocalciferol, (DRISDOL) 1.25 MG (50000 UNIT) CAPS capsule Take 1 capsule (50,000 Units total) by mouth every 7 (seven) days. Please get updated vit D level. 03/03/23   Storm Frisk, MD  loratadine (CLARITIN) 10 MG tablet Take 1 tablet (10 mg total) by mouth daily. As needed for itchy throat/allergy symptoms Patient not taking: Reported on 03/24/2019 12/26/18 07/05/20  Cain Saupe, MD      Allergies    Macrobid [nitrofurantoin]    Review of Systems   Review of Systems  Physical Exam Updated Vital Signs BP 121/67   Pulse (!) 123   Temp (!) 100.9 F (38.3 C)   Resp (!) 23   SpO2 98%  Physical Exam Vitals and nursing note reviewed.  Constitutional:      General: He is not in acute distress.    Appearance: Normal appearance.  HENT:     Mouth/Throat:     Mouth: Mucous membranes are dry.  Eyes:  Conjunctiva/sclera: Conjunctivae normal.  Cardiovascular:     Rate and Rhythm: Regular rhythm. Tachycardia present.  Pulmonary:     Effort: Pulmonary effort is normal. No respiratory distress.     Breath sounds: Normal breath sounds.  Abdominal:     General: Abdomen is flat.     Palpations: Abdomen is soft.     Tenderness: There is no abdominal tenderness.  Musculoskeletal:     Right lower leg: No edema.     Left lower leg: No edema.  Skin:    General: Skin is warm and dry.     Capillary Refill: Capillary refill takes less than 2 seconds.     Comments: Right lower buttock chronic ulcer approximately 1 x 1  inch, no surrounding erythema or purulent drainage  Neurological:     Mental Status: He is alert and oriented to person, place, and time. Mental status is at baseline.     Comments: Bilateral lower extremity paralysis  Psychiatric:        Mood and Affect: Mood normal.        Behavior: Behavior normal.     ED Results / Procedures / Treatments   Labs (all labs ordered are listed, but only abnormal results are displayed) Labs Reviewed  COMPREHENSIVE METABOLIC PANEL - Abnormal; Notable for the following components:      Result Value   Sodium 130 (*)    Chloride 96 (*)    CO2 20 (*)    Glucose, Bld 148 (*)    Calcium 8.3 (*)    Total Protein 6.1 (*)    Albumin 1.9 (*)    All other components within normal limits  CBC WITH DIFFERENTIAL/PLATELET - Abnormal; Notable for the following components:   WBC 23.4 (*)    RBC 3.19 (*)    Hemoglobin 8.9 (*)    HCT 27.4 (*)    Platelets 463 (*)    Neutro Abs 19.3 (*)    Monocytes Absolute 2.2 (*)    Abs Immature Granulocytes 0.24 (*)    All other components within normal limits  PROTIME-INR - Abnormal; Notable for the following components:   Prothrombin Time 16.6 (*)    INR 1.3 (*)    All other components within normal limits  APTT - Abnormal; Notable for the following components:   aPTT 42 (*)    All other components within normal limits  URINALYSIS, W/ REFLEX TO CULTURE (INFECTION SUSPECTED) - Abnormal; Notable for the following components:   APPearance HAZY (*)    Protein, ur >=300 (*)    Leukocytes,Ua TRACE (*)    Bacteria, UA RARE (*)    All other components within normal limits  LACTIC ACID, PLASMA - Abnormal; Notable for the following components:   Lactic Acid, Venous 2.0 (*)    All other components within normal limits  I-STAT CG4 LACTIC ACID, ED - Abnormal; Notable for the following components:   Lactic Acid, Venous 3.5 (*)    All other components within normal limits  RESP PANEL BY RT-PCR (RSV, FLU A&B, COVID)  RVPGX2   CULTURE, BLOOD (ROUTINE X 2)  CULTURE, BLOOD (ROUTINE X 2)  URINE CULTURE  LACTIC ACID, PLASMA  I-STAT CG4 LACTIC ACID, ED    EKG None  Radiology DG Chest Port 1 View  Result Date: 04/23/2023 CLINICAL DATA:  62 year old male with possible sepsis. EXAM: PORTABLE CHEST 1 VIEW COMPARISON:  Chest x-ray 04/13/2023. FINDINGS: Lung volumes are low. No consolidative airspace disease. No pleural effusions. No pneumothorax. No  pulmonary nodule or mass noted. Pulmonary vasculature and the cardiomediastinal silhouette are within normal limits. Orthopedic fixation hardware in the lower thoracic and lumbar spine partially imaged. Surgical clips project over the right upper quadrant of the abdomen, likely from prior cholecystectomy. IMPRESSION: 1. Low lung volumes without radiographic evidence of acute cardiopulmonary disease. Electronically Signed   By: Trudie Reed M.D.   On: 04/23/2023 05:24    Procedures .Critical Care  Performed by: Darrick Grinder, PA-C Authorized by: Darrick Grinder, PA-C   Critical care provider statement:    Critical care time (minutes):  30   Critical care was necessary to treat or prevent imminent or life-threatening deterioration of the following conditions:  Sepsis   Critical care was time spent personally by me on the following activities:  Development of treatment plan with patient or surrogate, discussions with consultants, evaluation of patient's response to treatment, examination of patient, ordering and review of laboratory studies, ordering and review of radiographic studies, ordering and performing treatments and interventions, pulse oximetry, re-evaluation of patient's condition and review of old charts   Care discussed with: admitting provider       Medications Ordered in ED Medications  lactated ringers infusion ( Intravenous New Bag/Given 04/23/23 0553)  lactated ringers bolus 1,000 mL (1,000 mLs Intravenous New Bag/Given 04/23/23 0503)    And   lactated ringers bolus 1,000 mL (1,000 mLs Intravenous New Bag/Given 04/23/23 0505)    And  lactated ringers bolus 1,000 mL (1,000 mLs Intravenous New Bag/Given 04/23/23 0553)  ondansetron (ZOFRAN) injection 4 mg (4 mg Intravenous Given 04/23/23 0459)  meropenem (MERREM) 1 g in sodium chloride 0.9 % 100 mL IVPB (0 g Intravenous Stopped 04/23/23 0550)    ED Course/ Medical Decision Making/ A&P                             Medical Decision Making Amount and/or Complexity of Data Reviewed Labs: ordered. Radiology: ordered. ECG/medicine tests: ordered.  Risk Prescription drug management.   This patient presents to the ED for concern of fever, chills, nausea, vomiting, this involves an extensive number of treatment options, and is a complaint that carries with it a high risk of complications and morbidity.  The differential diagnosis includes sepsis due to UTI, sepsis due to other infectious process, viral infection, others   Co morbidities that complicate the patient evaluation  History of ESBL, self caths, paraplegia   Additional history obtained:  Additional history obtained from patient's wife External records from outside source obtained and reviewed including discharge summary from 04/15/23   Lab Tests:  I Ordered, and personally interpreted labs.  The pertinent results include: Leukocytosis 23.4, hemoglobin 8.9 (consistent with baseline), I-stat lactic acid 3.5, venous lactic acid 2.0   Imaging Studies ordered:  I ordered imaging studies including chest x-ray  I independently visualized and interpreted imaging which showed low lung volumes with no active disease I agree with the radiologist interpretation   Cardiac Monitoring: / EKG:  The patient was maintained on a cardiac monitor.  I personally viewed and interpreted the cardiac monitored which showed an underlying rhythm of: sinus tachycardia   Consultations Obtained:  I requested consultation with the  hospitalist,  Dr. Margo Aye, and discussed lab and imaging findings as well as pertinent plan - they recommend: admission   Problem List / ED Course / Critical interventions / Medication management   I ordered medication including LR bolus, meropenem for assumed  urosepsis, zofran for nausea  Reevaluation of the patient after these medicines showed that the patient improved I have reviewed the patients home medicines and have made adjustments as needed   Social Determinants of Health:  Patient has no reported health insurance   Test / Admission - Considered:  Patient presents with vital signs being SIRS criteria with significant tachycardia, febrile at 100.9 F orally.  Patient also borderline tachypneic with most recent respiratory rate being 23.  Patient needs admission for continued IV antibiotics, fluid administration, and further evaluation and management for likely urosepsis.         Final Clinical Impression(s) / ED Diagnoses Final diagnoses:  Sepsis, due to unspecified organism, unspecified whether acute organ dysfunction present [facility])    Rx / DC Orders ED Discharge Orders     None         Pamala Duffel 04/23/23 0640    Nira Conn, MD 04/23/23 [phone]

## 2023-04-23 NOTE — Telephone Encounter (Signed)
Requested medication (s) are due for refill today:yes  Requested medication (s) are on the active medication list: yes  Last refill:  03/03/23 # 4   Future visit scheduled:yes  Notes to clinic:  med not delegated to NT to RF    Requested Prescriptions  Pending Prescriptions Disp Refills   Vitamin D, Ergocalciferol, (DRISDOL) 1.25 MG (50000 UNIT) CAPS capsule 4 capsule 0    Sig: Take 1 capsule (50,000 Units total) by mouth every 7 (seven) days. Please get updated vit D level.     Endocrinology:  Vitamins - Vitamin D Supplementation 2 Failed - 04/23/2023 12:53 PM      Failed - Manual Review: Route requests for 50,000 IU strength to the provider      Failed - Ca in normal range and within 360 days    Calcium  Date Value Ref Range Status  04/23/2023 8.3 (L) 8.9 - 10.3 mg/dL Final   Calcium, Ion  Date Value Ref Range Status  04/08/2012 1.27 1.12 - 1.32 mmol/L Final         Failed - Vitamin D in normal range and within 360 days    Vit D, 25-Hydroxy  Date Value Ref Range Status  10/28/2022 24.0 (L) 30.0 - 100.0 ng/mL Final    Comment:    Vitamin D deficiency has been defined by the Institute of Medicine and an Endocrine Society practice guideline as a level of serum 25-OH vitamin D less than 20 ng/mL (1,2). The Endocrine Society went on to further define vitamin D insufficiency as a level between 21 and 29 ng/mL (2). 1. IOM (Institute of Medicine). 2010. Dietary reference    intakes for calcium and D. [place]: The    [employer]. 2. Holick MF, Binkley Jameson, Bischoff-Ferrari HA, et al.    Evaluation, treatment, and prevention of vitamin D    deficiency: an Endocrine Society clinical practice    guideline. JCEM. 2011 Jul; 96(7):1911-30.          Passed - Valid encounter within last 12 months    Recent Outpatient Visits           2 months ago Controlled type 2 diabetes mellitus with hyperglycemia, without long-term current use of insulin [facility])   [place]  [facility] Storm Frisk, MD   4 months ago Controlled type 2 diabetes mellitus with hyperglycemia, without long-term current use of insulin [facility])   [facility] [employer] [facility] Storm Frisk, MD   5 months ago Insulin dependent type 2 diabetes mellitus, controlled [facility])   [facility], Amy J, NP   5 months ago Insulin dependent type 2 diabetes mellitus, controlled [facility])   [place] [facility] [place], Marylene Land M, [place]   9 months ago Insulin dependent type 2 diabetes mellitus, controlled [facility])   [place] [facility] Storm Frisk, MD       Future Appointments             In 4 days Storm Frisk, MD [facility]   In 1 month [name] Charlcie Cradle, MD [facility]

## 2023-04-23 NOTE — Progress Notes (Signed)
Initial Nutrition Assessment  DOCUMENTATION CODES:      INTERVENTION:  -1 packet Juven BID, each packet provides 95 calories, 2.5 grams of protein (collagen), and 9.8 grams of carbohydrate (3 grams sugar); also contains 7 grams of L-arginine and L-glutamine, 300 mg vitamin C, 15 mg vitamin E, 1.2 mcg vitamin B-12, 9.5 mg zinc, 200 mg calcium, and 1.5 g  Calcium Beta-hydroxy-Beta-methylbutyrate to support wound healing  -MVI  -Glucerna Shake po BID, each supplement provides 220 kcal and 10 grams of protein  -encourage po intake  -please document meal consumption percentage in chart   -RD would appreciate updated weight in chart   -communicate with RN   NUTRITION DIAGNOSIS:   Increased nutrient needs related to wound healing as evidenced by other (comment) (unstageable wound).  GOAL:   Patient will meet greater than or equal to 90% of their needs   MONITOR:   PO intake, Labs, I & O's, Weight trends, Supplement acceptance, Skin  REASON FOR ASSESSMENT:   Consult Other (Comment) (nutritional goals)  ASSESSMENT:   62 y.o. 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 RD is working remotely, and unable to reach patient.  History obtained through chart review   Per MD notes:  - Patient was recently admitted on 6/30 and d/c'd on 7/11 for complicated UTI and N/V.  - WC bound  - Baseline Na is 130  RD secure messaged patient's RN who reports he ate about 25% of breakfast and is no longer nauseous.  Labs:  Na 130, Cl 96, Glu 148, last A1c 7 Meds: lipitor, colace, pepcid, insulin, medihoney, NS, lactated ringers   Wt: RD suspects weight has been pulled forward from other encounters, unable to determine accurate weight change     Wt Readings from Last 10 Encounters:  04/11/23 87 kg  03/20/22 87 kg  02/28/22 87 kg  05/14/21 76.7 kg  RD would appreciate accurate weight   PO: no meal intakes documented at this time  I/O's: +3 L, no UOP recorded at this time   NUTRITION - FOCUSED PHYSICAL EXAM:  RD working remotely   Diet Order:   Diet Order             Diet Carb Modified Fluid consistency: Thin; Room service appropriate? Yes  Diet effective now                   EDUCATION NEEDS:   Not appropriate for education at this time  Skin:  Skin Assessment: Skin Integrity Issues: Skin Integrity Issues:: Unstageable Unstageable: R buttocks  Last BM:  unknown  Height:   Ht Readings from Last 1 Encounters:  04/11/23 5\' 4"  (1.626 m)    Weight:   Wt Readings from Last 1 Encounters:  04/11/23 87 kg    Ideal Body Weight:     BMI:  There is no height or weight on file to calculate BMI.  Estimated Nutritional Needs:   Kcal:  1700-1900  Protein:  100-130 g  Fluid:  >/= 1.9L    Leodis Rains, RDN, LDN  Clinical Nutrition

## 2023-04-23 NOTE — ED Notes (Signed)
ED TO INPATIENT HANDOFF REPORT  ED Nurse Name and Phone #: [phone]  S Name/Age/Gender John Meza 62 y.o. male Room/Bed: 031C/031C  Code Status   Code Status: Prior  Home/SNF/Other Home Patient oriented to: self, place, time, and situation Is this baseline? Yes   Triage Complete: Triage complete  Chief Complaint Sepsis secondary to UTI (HCC) [A41.9, N39.0]  Triage Note No notes on file   Allergies Allergies  Allergen Reactions   Macrobid [Nitrofurantoin] Itching and Rash    Level of Care/Admitting Diagnosis ED Disposition     ED Disposition  Admit   Condition  --   Comment  Hospital Area: [facility] [[id number]]  Level of Care: Progressive [102]  Admit to Progressive based on following criteria: MULTISYSTEM THREATS such as stable sepsis, metabolic/electrolyte imbalance with or without encephalopathy that is responding to early treatment.  May admit patient to [facility] or [facility] if equivalent level of care is available:: No  Covid Evaluation: Asymptomatic - no recent exposure (last 10 days) testing not required  Diagnosis: Sepsis secondary to UTI [facility]) [[id number]]  Admitting Physician: Darlin Drop [[id number]]  Attending Physician: Darlin Drop [[id number]]  Certification:: I certify this patient will need inpatient services for at least 2 midnights  Estimated Length of Stay: 2          B Medical/Surgery History Past Medical History:  Diagnosis Date   Cellulitis and abscess of buttock 10/2016   Diabetes mellitus    ESBL (extended spectrum beta-lactamase) producing bacteria infection 03/16/2022   Hyperkalemia 03/16/2022   Hypertension    Myositis 04/21/2022   Paraplegia (HCC) 2013   fell from ladder   Pressure ulcer, buttock, right, unstageable (HCC) 12/30/2020   Pyelonephritis due to Escherichia coli 03/16/2022   Sacral decubitus ulcer, stage IV (HCC) 11/03/2021   SCI (spinal cord injury)    Sepsis due to Escherichia coli (E.  coli) (HCC) 03/01/2022   Spine fracture 11/16/2011   T 11- T9-L1   Past Surgical History:  Procedure Laterality Date   CHOLECYSTECTOMY N/A 05/01/2015   Procedure: LAPAROSCOPIC CHOLECYSTECTOMY WITH ATTEMPTED INTRAOPERATIVE CHOLANGIOGRAM;  Surgeon: Violeta Gelinas, MD;  Location: [facility] OR;  Service: General;  Laterality: N/A;   SPINE SURGERY     TEE WITHOUT CARDIOVERSION N/A 03/26/2022   Procedure: TRANSESOPHAGEAL ECHOCARDIOGRAM (TEE);  Surgeon: [name], MD;  Location: [facility] ENDOSCOPY;  Service: Cardiovascular;  Laterality: N/A;     A IV Location/Drains/Wounds Patient Lines/Drains/Airways Status     Active Line/Drains/Airways     Name Placement date Placement time Site Days   Peripheral IV 04/23/23 20 G Anterior;Right Forearm 04/23/23  0450  Forearm  less than 1   Peripheral IV 04/23/23 18 G Anterior;Left;Proximal Forearm 04/23/23  0442  Forearm  less than 1   Pressure Injury 04/11/23 Buttocks Right Unstageable - Full thickness tissue loss in which the base of the injury is covered by slough (yellow, tan, gray, green or brown) and/or eschar (tan, brown or black) in the wound bed. 04/11/23  1814  -- 12            Intake/Output Last 24 hours  Intake/Output Summary (Last 24 hours) at 04/23/2023 0726 Last data filed at 04/23/2023 2130 Gross per 24 hour  Intake 3097.9 ml  Output --  Net 3097.9 ml    Labs/Imaging Results for orders placed or performed during the hospital encounter of 04/23/23 (from the past 48 hour(s))  Resp panel by RT-PCR (RSV, Flu A&B, Covid) Anterior  Nasal Swab     Status: None   Collection Time: 04/23/23  4:37 AM   Specimen: Anterior Nasal Swab  Result Value Ref Range   SARS Coronavirus 2 by RT PCR NEGATIVE NEGATIVE   Influenza A by PCR NEGATIVE NEGATIVE   Influenza B by PCR NEGATIVE NEGATIVE    Comment: (NOTE) The Xpert Xpress SARS-CoV-2/FLU/RSV plus assay is intended as an aid in the diagnosis of influenza from Nasopharyngeal swab specimens and should  not be used as a sole basis for treatment. Nasal washings and aspirates are unacceptable for Xpert Xpress SARS-CoV-2/FLU/RSV testing.  Fact Sheet for Patients: BloggerCourse.com  Fact Sheet for Healthcare Providers: SeriousBroker.it  This test is not yet approved or cleared by the [place] FDA and has been authorized for detection and/or diagnosis of SARS-CoV-2 by FDA under an Emergency Use Authorization (EUA). This EUA will remain in effect (meaning this test can be used) for the duration of the COVID-19 declaration under Section 564(b)(1) of the Act, 21 U.S.C. section 360bbb-3(b)(1), unless the authorization is terminated or revoked.     Resp Syncytial Virus by PCR NEGATIVE NEGATIVE    Comment: (NOTE) Fact Sheet for Patients: BloggerCourse.com  Fact Sheet for Healthcare Providers: SeriousBroker.it  This test is not yet approved or cleared by the [place] FDA and has been authorized for detection and/or diagnosis of SARS-CoV-2 by FDA under an Emergency Use Authorization (EUA). This EUA will remain in effect (meaning this test can be used) for the duration of the COVID-19 declaration under Section 564(b)(1) of the Act, 21 U.S.C. section 360bbb-3(b)(1), unless the authorization is terminated or revoked.  Performed at [facility] Lab, [address] [place], [place] [address]   Comprehensive metabolic panel     Status: Abnormal   Collection Time: 04/23/23  4:39 AM  Result Value Ref Range   Sodium 130 (L) 135 - 145 mmol/L   Potassium 3.7 3.5 - 5.1 mmol/L   Chloride 96 (L) 98 - 111 mmol/L   CO2 20 (L) 22 - 32 mmol/L   Glucose, Bld 148 (H) 70 - 99 mg/dL    Comment: Glucose reference range applies only to samples taken after fasting for at least 8 hours.   BUN 16 6 - 20 mg/dL   Creatinine, Ser 1.91 0.61 - 1.24 mg/dL   Calcium 8.3 (L) 8.9 - 10.3 mg/dL   Total  Protein 6.1 (L) 6.5 - 8.1 g/dL   Albumin 1.9 (L) 3.5 - 5.0 g/dL   AST 28 15 - 41 U/L   ALT 29 0 - 44 U/L   Alkaline Phosphatase 117 38 - 126 U/L   Total Bilirubin 0.6 0.3 - 1.2 mg/dL   GFR, Estimated >47 >82 mL/min    Comment: (NOTE) Calculated using the CKD-EPI Creatinine Equation (2021)    Anion gap 14 5 - 15    Comment: Performed at [facility], [address], [place], [place] [address]  CBC with Differential     Status: Abnormal   Collection Time: 04/23/23  4:39 AM  Result Value Ref Range   WBC 23.4 (H) 4.0 - 10.5 K/uL   RBC 3.19 (L) 4.22 - 5.81 MIL/uL   Hemoglobin 8.9 (L) 13.0 - 17.0 g/dL   HCT 30.8 (L) 65.7 - 84.6 %   MCV 85.9 80.0 - 100.0 fL   MCH 27.9 26.0 - 34.0 pg   MCHC 32.5 30.0 - 36.0 g/dL   RDW 96.2 95.2 - 84.1 %   Platelets 463 (  H) 150 - 400 K/uL   nRBC 0.0 0.0 - 0.2 %   Neutrophils Relative % 83 %   Neutro Abs 19.3 (H) 1.7 - 7.7 K/uL   Lymphocytes Relative 7 %   Lymphs Abs 1.6 0.7 - 4.0 K/uL   Monocytes Relative 9 %   Monocytes Absolute 2.2 (H) 0.1 - 1.0 K/uL   Eosinophils Relative 0 %   Eosinophils Absolute 0.1 0.0 - 0.5 K/uL   Basophils Relative 0 %   Basophils Absolute 0.1 0.0 - 0.1 K/uL   Immature Granulocytes 1 %   Abs Immature Granulocytes 0.24 (H) 0.00 - 0.07 K/uL    Comment: Performed at [facility], [address] [place], [place] [address]  Protime-INR     Status: Abnormal   Collection Time: 04/23/23  4:39 AM  Result Value Ref Range   Prothrombin Time 16.6 (H) 11.4 - 15.2 seconds   INR 1.3 (H) 0.8 - 1.2    Comment: (NOTE) INR goal varies based on device and disease states. Performed at [facility], [address], [place], [place] [address]   APTT     Status: Abnormal   Collection Time: 04/23/23  4:39 AM  Result Value Ref Range   aPTT 42 (H) 24 - 36 seconds    Comment:        IF BASELINE aPTT IS ELEVATED, SUGGEST PATIENT RISK ASSESSMENT BE USED TO DETERMINE APPROPRIATE ANTICOAGULANT THERAPY. Performed at [facility], [address], [place], [place] [address]   Lactic acid, plasma     Status: Abnormal   Collection Time: 04/23/23  4:39 AM  Result Value Ref Range   Lactic Acid, Venous 2.0 (HH) 0.5 - 1.9 mmol/L    Comment: CRITICAL RESULT CALLED TO, READ BACK BY AND VERIFIED WITH Vito Backers, RN. [phone] 04/23/23. LPAIT Performed at [facility], [address], [place], [place] [address]   I-Stat Lactic Acid, ED     Status: None   Collection Time: 04/23/23  4:46 AM  Result Value Ref Range   Lactic Acid, Venous 1.9 0.5 - 1.9 mmol/L  Urinalysis, w/ Reflex to Culture (Infection Suspected) -Urine, Catheterized     Status: Abnormal   Collection Time: 04/23/23  5:43 AM  Result Value Ref Range   Specimen Source URINE, CATHETERIZED    Color, Urine YELLOW YELLOW   APPearance HAZY (A) CLEAR   Specific Gravity, Urine 1.014 1.005 - 1.030   pH 5.0 5.0 - 8.0   Glucose, UA NEGATIVE NEGATIVE mg/dL   Hgb urine dipstick NEGATIVE NEGATIVE   Bilirubin Urine NEGATIVE NEGATIVE   Ketones, ur NEGATIVE NEGATIVE mg/dL   Protein, ur >=132 (A) NEGATIVE mg/dL   Nitrite NEGATIVE NEGATIVE   Leukocytes,Ua TRACE (A) NEGATIVE   RBC / HPF 6-10 0 - 5 RBC/hpf   WBC, UA 21-50 0 - 5 WBC/hpf    Comment:        Reflex urine culture not performed if WBC <=10, OR if Squamous epithelial cells >5. If Squamous epithelial cells >5 suggest recollection.    Bacteria, UA RARE (A) NONE SEEN   Squamous Epithelial / HPF 0-5 0 - 5 /HPF   Mucus PRESENT    Budding Yeast PRESENT    Hyaline Casts, UA PRESENT     Comment: Performed at [facility], [address] [place], [place] [address]  I-Stat Lactic Acid, ED     Status: Abnormal   Collection Time: 04/23/23  6:19  AM  Result Value Ref Range   Lactic Acid, Venous 3.5 (HH) 0.5 - 1.9 mmol/L   Comment NOTIFIED PHYSICIAN   I-Stat CG4 Lactic Acid, ED     Status: None   Collection Time: 04/23/23  7:06 AM  Result Value Ref Range   Lactic Acid, Venous 1.9 0.5 - 1.9 mmol/L    DG Chest Port 1 View  Result Date: 04/23/2023 CLINICAL DATA:  62 year old male with possible sepsis. EXAM: PORTABLE CHEST 1 VIEW COMPARISON:  Chest x-ray 04/13/2023. FINDINGS: Lung volumes are low. No consolidative airspace disease. No pleural effusions. No pneumothorax. No pulmonary nodule or mass noted. Pulmonary vasculature and the cardiomediastinal silhouette are within normal limits. Orthopedic fixation hardware in the lower thoracic and lumbar spine partially imaged. Surgical clips project over the right upper quadrant of the abdomen, likely from prior cholecystectomy. IMPRESSION: 1. Low lung volumes without radiographic evidence of acute cardiopulmonary disease. Electronically Signed   By: Trudie Reed M.D.   On: 04/23/2023 05:24    Pending Labs Unresulted Labs (From admission, onward)     Start     Ordered   04/23/23 0543  Urine Culture  Once,   R        04/23/23 0543   04/23/23 0519  Lactic acid, plasma  Now then every 2 hours,   R      04/23/23 0518   04/23/23 0437  Blood Culture (routine x 2)  (Septic presentation on arrival (screening labs, nursing and treatment orders for obvious sepsis))  BLOOD CULTURE X 2,   STAT      04/23/23 0438            Vitals/Pain Today's Vitals   04/23/23 0630 04/23/23 0645 04/23/23 0700 04/23/23 0715  BP: 120/71 115/67 124/69 111/68  Pulse: (!) 117 (!) 120 (!) 126 (!) 127  Resp: (!) 24 (!) 24 18 (!) 21  Temp:  99.7 F (37.6 C)    SpO2: 100% 97% 98% 96%    Isolation Precautions No active isolations  Medications Medications  lactated ringers infusion ( Intravenous Rate/Dose Verify 04/23/23 0719)  lactated ringers bolus 1,000 mL (0 mLs Intravenous Stopped 04/23/23 0533)    And  lactated ringers bolus 1,000 mL (0 mLs Intravenous Stopped 04/23/23 0535)    And  lactated ringers bolus 1,000 mL (0 mLs Intravenous Stopped 04/23/23 0623)  ondansetron (ZOFRAN) injection 4 mg (4 mg Intravenous Given 04/23/23 0459)  meropenem (MERREM) 1 g in  sodium chloride 0.9 % 100 mL IVPB (0 g Intravenous Stopped 04/23/23 0550)    Mobility manual wheelchair     Focused Assessments Cardiac Assessment Handoff:  Cardiac Rhythm: Sinus tachycardia No results found for: "CKTOTAL", "CKMB", "CKMBINDEX", "TROPONINI" No results found for: "DDIMER" Does the Patient currently have chest pain? No   , Neuro Assessment Handoff:  Swallow screen pass? Yes  Cardiac Rhythm: Sinus tachycardia       Neuro Assessment:   Neuro Checks:      Has TPA been given? No If patient is a Neuro Trauma and patient is going to OR before floor call report to 4N Charge nurse: [phone] or [phone]  , Pulmonary Assessment Handoff:  Lung sounds:   O2 Device: Room Air      R Recommendations: See Admitting Provider Note  Report given to:   Additional Notes:

## 2023-04-23 NOTE — Sepsis Progress Note (Signed)
Elink monitoring for the code sepsis protocol.  

## 2023-04-24 LAB — BASIC METABOLIC PANEL
Anion gap: 15 (ref 5–15)
BUN: 15 mg/dL (ref 6–20)
CO2: 20 mmol/L — ABNORMAL LOW (ref 22–32)
Calcium: 8.3 mg/dL — ABNORMAL LOW (ref 8.9–10.3)
Chloride: 98 mmol/L (ref 98–111)
Creatinine, Ser: 0.84 mg/dL (ref 0.61–1.24)
GFR, Estimated: >60 mL/min (ref >60)
Glucose, Bld: 102 mg/dL — ABNORMAL HIGH (ref 70–99)
Potassium: 4.1 mmol/L (ref 3.5–5.1)
Sodium: 133 mmol/L — ABNORMAL LOW (ref 135–145)

## 2023-04-24 LAB — CBC
HCT: 21.4 % — ABNORMAL LOW (ref 39.0–52.0)
Hemoglobin: 7.3 g/dL — ABNORMAL LOW (ref 13.0–17.0)
MCH: 28.6 pg (ref 26.0–34.0)
MCHC: 34.1 g/dL (ref 30.0–36.0)
MCV: 83.9 fL (ref 80.0–100.0)
Platelets: 323 10*3/uL (ref 150–400)
RBC: 2.55 MIL/uL — ABNORMAL LOW (ref 4.22–5.81)
RDW: 14.3 % (ref 11.5–15.5)
WBC: 17.8 10*3/uL — ABNORMAL HIGH (ref 4.0–10.5)
nRBC: 0 % (ref 0.0–0.2)

## 2023-04-24 LAB — GLUCOSE, CAPILLARY
Glucose-Capillary: 106 mg/dL — ABNORMAL HIGH (ref 70–99)
Glucose-Capillary: 138 mg/dL — ABNORMAL HIGH (ref 70–99)
Glucose-Capillary: 187 mg/dL — ABNORMAL HIGH (ref 70–99)
Glucose-Capillary: 225 mg/dL — ABNORMAL HIGH (ref 70–99)

## 2023-04-24 LAB — URINE CULTURE: Culture: NO GROWTH

## 2023-04-24 MED ORDER — SODIUM CHLORIDE 0.9 % IV SOLN
1.0000 g | Freq: Three times a day (TID) | INTRAVENOUS | Status: DC
  Administered 2023-04-24 – 2023-04-28 (×11): 1 g via INTRAVENOUS
  Filled 2023-04-24 (×14): qty 20

## 2023-04-24 NOTE — Progress Notes (Addendum)
Pharmacy Antibiotic Note  John Meza is a 62 y.o. male admitted on 04/23/2023 with  possible UTI .  Pharmacy has been consulted for Meropenem dosing (with hx of ESBL UTI)  Previous hospitalization for complicated UTI, treated with meropenem and discharged on Cipro through 7/11.   WBC trending down, Tmax 101.1, renal function normal.  Plan: Start meropenem 1g every 8 hours for recurrent UTI.  F/u cx, renal function, and length of therapy     Temp (24hrs), Avg:98.9 F (37.2 C), Min:97.6 F (36.4 C), Max:101.1 F (38.4 C)  Recent Labs  Lab 04/23/23 0439 04/23/23 0446 04/23/23 0619 04/23/23 0706 04/23/23 0735 04/24/23 0214  WBC 23.4*  --   --   --   --  17.8*  CREATININE 1.20  --   --   --   --  0.84  LATICACIDVEN 2.0* 1.9 3.5* 1.9 1.4  --     Estimated Creatinine Clearance: 94.2 mL/min (by C-G formula based on SCr of 0.84 mg/dL).    Allergies  Allergen Reactions   Macrobid [Nitrofurantoin] Itching and Rash    Antimicrobials this admission:  Dose adjustments this admission:  Microbiology results: 7/12 BCx: NGTD 7/12 UCx: NGTD   Previously 6/28 urine cx: kleb pneumo, morganella morganii (ESBL)  Meropenem 6/30 >> 7/3  Cipro 7/3 >> 7/11  Thank you for allowing pharmacy to be a part of this patient's care.  Griffin Dakin 04/24/2023 9:35 AM

## 2023-04-24 NOTE — Progress Notes (Signed)
PROGRESS NOTE  John Meza  WNU:[id number] DOB: 10/24/1960 DOA: 04/23/2023 PCP: Storm Frisk, MD   Brief Narrative: Patient is a 62 year old male with history of paraplegia, neurogenic bladder requiring self cath, recurrent ESBL UTI, diabetes type 2, hypertension, chronic right buttock pressure ulcer who presented with fever.  He was recently hospitalized here in June for complicated UTI due to Klebsiella pneumonia, Morganella morganii and was discharged on Cipro.  Report of fever with nausea and vomiting.  On presentation, lab work showed WBC count of 26,000, lactate of 3.  UA suspicious for pyuria.  Started on Merem.  Continues to be febrile  Assessment & Plan:  Principal Problem:   Sepsis (HCC) Active Problems:   Controlled type 2 diabetes mellitus (HCC)   Paraplegia (HCC)   Hyponatremia   Essential hypertension   Complicated UTI (urinary tract infection)   Normocytic anemia   Hyperlipidemia associated with type 2 diabetes mellitus (HCC)  Sepsis: Presented with leukocytosis, fever, tachycardia, dyspnea.  Elevated lactate.  Follow-up urine culture, blood culture.  Suspected UTI.  Started on [place] .Procalcitonin elevated.  Recurrent complicated UTI: History of ESBL E. coli UTI in February 2021, treated for Klebsiella/Morganella UTI a month ago.  Patient has neurogenic bladder, requires self cath.  Follow-up urine culture.  Currently on Foley cath.  I think discharging him with Foley catheter is a good idea.  I discussed this with the patient and his wife.  Intermittent self cath has more risk for infection  Nausea/vomiting: Was on ciprofloxacin on discharge from here which could have contributed.  Continue antiemetics.  Much better now  Diabetes type 2: Recent A1c of 7.  On Glucophage, Januvia at home.  Currently on sliding scale  Hypertension: Takes valsartan, hydrochlorothiazide at home.  Hyperlipidemia: On Lipitor  Paraplegia: On wheelchair-bound.  Has  neurogenic bladder.  PT/OT consulted  Chronic right buttock pressure ulcer: Wound care following.  Currently does not appear infected.  Chronic hyponatremia: Currently at baseline  Normocytic  anemia: His baseline hemoglobin around 9-10.  Hemoglobin currently in the range of 7.  Denies any hematochezia or melena.  No evidence of acute blood loss.  Continue monitoring.      Nutrition Problem: Increased nutrient needs Etiology: wound healing Pressure Injury 04/11/23 Buttocks Right Unstageable - Full thickness tissue loss in which the base of the injury is covered by slough (yellow, tan, gray, green or brown) and/or eschar (tan, brown or black) in the wound bed. (Active)  04/11/23 1814  Location: Buttocks  Location Orientation: Right  Staging: Unstageable - Full thickness tissue loss in which the base of the injury is covered by slough (yellow, tan, gray, green or brown) and/or eschar (tan, brown or black) in the wound bed.  Wound Description (Comments):   Present on Admission: Yes  Dressing Type Foam - Lift dressing to assess site every shift 04/23/23 2000    DVT prophylaxis:enoxaparin (LOVENOX) injection 40 mg Start: 04/23/23 1000     Code Status: Full Code  Family Communication: Wife at bedside with help of the Spanish interpreter  Patient status: Inpatient  Patient is from : Home  Anticipated discharge to: Home  Estimated DC date: 1 to 2 days   Consultants: ID  Procedures: None  Antimicrobials:  Anti-infectives (From admission, onward)    Start     Dose/Rate Route Frequency Ordered Stop   04/23/23 0445  ceFEPIme (MAXIPIME) 2 g in sodium chloride 0.9 % 100 mL IVPB  Status:  Discontinued  2 g 200 mL/hr over 30 Minutes Intravenous  Once 04/23/23 0438 04/23/23 0443   04/23/23 0445  meropenem (MERREM) 1 g in sodium chloride 0.9 % 100 mL IVPB        1 g 200 mL/hr over 30 Minutes Intravenous  Once 04/23/23 0443 04/23/23 0550       Subjective: Patient seen  and examined the bedside today.  Hemodynamically stable.  Appears comfortable today.  Lying in bed.  No nausea or vomiting this morning.  EKG monitor showed sinus tachycardia.  Was febrile last night.  Objective: Vitals:   04/23/23 2004 04/23/23 2148 04/23/23 2348 04/24/23 0421  BP: 139/66 119/64 126/64 138/70  Pulse: (!) 124 (!) 116 (!) 105 (!) 110  Resp: 20 (!) 21 14 20   Temp: (!) 101.1 F (38.4 C) 99.5 F (37.5 C) 97.6 F (36.4 C) 98.8 F (37.1 C)  TempSrc: Oral Oral Oral Oral  SpO2: 97% 95% 100% 99%    Intake/Output Summary (Last 24 hours) at 04/24/2023 0811 Last data filed at 04/24/2023 0530 Gross per 24 hour  Intake 974.6 ml  Output 3700 ml  Net -2725.4 ml   There were no vitals filed for this visit.  Examination:  General exam: Overall comfortable, not in distress HEENT: PERRL Respiratory system:  no wheezes or crackles  Cardiovascular system: S1 & S2 heard, RRR.  Gastrointestinal system: Abdomen is nondistended, soft and nontender. Central nervous system: Alert and oriented Extremities: No edema, no clubbing ,no cyanosis, paraplegia Skin: No rashes, no ulcers,no icterus   GU: Foley catheter   Data Reviewed: I have personally reviewed following labs and imaging studies  CBC: Recent Labs  Lab 04/23/23 0439 04/24/23 0214  WBC 23.4* 17.8*  NEUTROABS 19.3*  --   HGB 8.9* 7.3*  HCT 27.4* 21.4*  MCV 85.9 83.9  PLT 463* 323   Basic Metabolic Panel: Recent Labs  Lab 04/23/23 0439 04/24/23 0214  NA 130* 133*  K 3.7 4.1  CL 96* 98  CO2 20* 20*  GLUCOSE 148* 102*  BUN 16 15  CREATININE 1.20 0.84  CALCIUM 8.3* 8.3*     Recent Results (from the past 240 hour(s))  Resp panel by RT-PCR (RSV, Flu A&B, Covid) Anterior Nasal Swab     Status: None   Collection Time: 04/23/23  4:37 AM   Specimen: Anterior Nasal Swab  Result Value Ref Range Status   SARS Coronavirus 2 by RT PCR NEGATIVE NEGATIVE Final   Influenza A by PCR NEGATIVE NEGATIVE Final   Influenza  B by PCR NEGATIVE NEGATIVE Final    Comment: (NOTE) The Xpert Xpress SARS-CoV-2/FLU/RSV plus assay is intended as an aid in the diagnosis of influenza from Nasopharyngeal swab specimens and should not be used as a sole basis for treatment. Nasal washings and aspirates are unacceptable for Xpert Xpress SARS-CoV-2/FLU/RSV testing.  Fact Sheet for Patients: BloggerCourse.com  Fact Sheet for Healthcare Providers: SeriousBroker.it  This test is not yet approved or cleared by the [place] FDA and has been authorized for detection and/or diagnosis of SARS-CoV-2 by FDA under an Emergency Use Authorization (EUA). This EUA will remain in effect (meaning this test can be used) for the duration of the COVID-19 declaration under Section 564(b)(1) of the Act, 21 U.S.C. section 360bbb-3(b)(1), unless the authorization is terminated or revoked.     Resp Syncytial Virus by PCR NEGATIVE NEGATIVE Final    Comment: (NOTE) Fact Sheet for Patients: BloggerCourse.com  Fact Sheet for Healthcare Providers: SeriousBroker.it  This test  is not yet approved or cleared by the [place] and has been authorized for detection and/or diagnosis of SARS-CoV-2 by FDA under an Emergency Use Authorization (EUA). This EUA will remain in effect (meaning this test can be used) for the duration of the COVID-19 declaration under Section 564(b)(1) of the Act, 21 U.S.C. section 360bbb-3(b)(1), unless the authorization is terminated or revoked.  Performed at [facility], [address], [place], [place] [address]   Blood Culture (routine x 2)     Status: None (Preliminary result)   Collection Time: 04/23/23  4:39 AM   Specimen: BLOOD LEFT ARM  Result Value Ref Range Status   Specimen Description BLOOD LEFT ARM  Final   Special Requests   Final    BOTTLES DRAWN AEROBIC AND ANAEROBIC Blood Culture  results may not be optimal due to an excessive volume of blood received in culture bottles   Culture   Final    NO GROWTH < 12 HOURS Performed at [facility], [address] [place], [place] [address]    Report Status PENDING  Incomplete  Blood Culture (routine x 2)     Status: None (Preliminary result)   Collection Time: 04/23/23  4:50 AM   Specimen: BLOOD  Result Value Ref Range Status   Specimen Description BLOOD SITE NOT SPECIFIED  Final   Special Requests   Final    BOTTLES DRAWN AEROBIC AND ANAEROBIC Blood Culture results may not be optimal due to an excessive volume of blood received in culture bottles   Culture   Final    NO GROWTH < 12 HOURS Performed at [facility], [address], [place], [place] [address]    Report Status PENDING  Incomplete     Radiology Studies: DG Chest Port 1 View  Result Date: 04/23/2023 CLINICAL DATA:  62 year old male with possible sepsis. EXAM: PORTABLE CHEST 1 VIEW COMPARISON:  Chest x-ray 04/13/2023. FINDINGS: Lung volumes are low. No consolidative airspace disease. No pleural effusions. No pneumothorax. No pulmonary nodule or mass noted. Pulmonary vasculature and the cardiomediastinal silhouette are within normal limits. Orthopedic fixation hardware in the lower thoracic and lumbar spine partially imaged. Surgical clips project over the right upper quadrant of the abdomen, likely from prior cholecystectomy. IMPRESSION: 1. Low lung volumes without radiographic evidence of acute cardiopulmonary disease. Electronically Signed   By: Trudie Reed M.D.   On: 04/23/2023 05:24    Scheduled Meds:  atorvastatin  20 mg Oral Q1200   Chlorhexidine Gluconate Cloth  6 each Topical Q0600   docusate sodium  100 mg Oral BID   enoxaparin (LOVENOX) injection  40 mg Subcutaneous Q24H   famotidine  20 mg Oral BID   feeding supplement (GLUCERNA SHAKE)  237 mL Oral BID BM   hydrochlorothiazide  25 mg Oral Daily   insulin aspart  0-15 Units  Subcutaneous TID WC   insulin aspart  0-5 Units Subcutaneous QHS   irbesartan  300 mg Oral Daily   leptospermum manuka honey  1 Application Topical Daily   multivitamin with minerals  1 tablet Oral Daily   nutrition supplement (JUVEN)  1 packet Oral BID BM   sodium chloride flush  3 mL Intravenous Q12H   Continuous Infusions:  lactated ringers 75 mL/hr at 04/24/23 0530     LOS: 1 day   Burnadette Pop, MD [facility] P7/13/2024, 8:11 AM

## 2023-04-24 NOTE — Plan of Care (Signed)
  Problem: Education: Goal: Knowledge of General Education information will improve Description: Including pain rating scale, medication(s)/side effects and non-pharmacologic comfort measures Outcome: Progressing   Problem: Health Behavior/Discharge Planning: Goal: Ability to manage health-related needs will improve Outcome: Progressing   Problem: Clinical Measurements: Goal: Cardiovascular complication will be avoided Outcome: Progressing   Problem: Activity: Goal: Risk for activity intolerance will decrease Outcome: Progressing   Problem: Nutrition: Goal: Adequate nutrition will be maintained Outcome: Progressing   Problem: Elimination: Goal: Will not experience complications related to urinary retention Outcome: Progressing   Problem: Pain Managment: Goal: General experience of comfort will improve Outcome: Progressing   Problem: Skin Integrity: Goal: Risk for impaired skin integrity will decrease Outcome: Progressing   

## 2023-04-24 NOTE — Evaluation (Signed)
Physical Therapy Evaluation Patient Details Name: John Meza MRN: [id number] DOB: Mar 10, 1961 Today's Date: 04/24/2023  History of Present Illness  Patient is a 62 year old male admitted 7/12 presented with fever, N/V.  On presentation, lab work showed WBC count of 26,000, lactate of 3.  UA suspicious for pyuria vs. virus. PMH: paraplegia, neurogenic bladder requiring self cath, recurrent ESBL UTI, diabetes type 2, hypertension, chronic right buttock pressure ulcer He was recently hospitalized here in June for complicated UTI due to Klebsiella pneumonia, Morganella morganii and was discharged on Cipro.  Report of fever with nausea and vomiting.  On presentation, lab work showed WBC count of 26,000, lactate of 3.  UA suspicious for pyuria.  Clinical Impression  Pt admitted with above diagnosis. Discussion with pt and wife regarding PT needs.  Wife and pt adamant that doctor told him not to be up much and he stays in bed due to this.  Sent chat to MD regarding this after evaluation complete, and MD stated pt shouldn't have precautions to be able to sit EOB and MD said he can transfer as well. Will follow acutely and progress pt as able.  Pt currently with functional limitations due to the deficits listed below (see PT Problem List). Pt will benefit from acute skilled PT to increase their independence and safety with mobility to allow discharge.           Assistance Recommended at Discharge Frequent or constant Supervision/Assistance  If plan is discharge home, recommend the following:  Can travel by private vehicle  A lot of help with walking and/or transfers;A lot of help with bathing/dressing/bathroom;Assistance with cooking/housework;Direct supervision/assist for medications management;Assist for transportation;Help with stairs or ramp for entrance        Equipment Recommendations  (Air mattress overlay due to buttock wound, hoyer lift)  Recommendations for Other Services        Functional Status Assessment Patient has had a recent decline in their functional status and demonstrates the ability to make significant improvements in function in a reasonable and predictable amount of time.     Precautions / Restrictions Precautions Precautions: Fall Precaution Comments: Wound to right buttocks Restrictions Weight Bearing Restrictions: No Other Position/Activity Restrictions: Patient has no feeling or AROM to B LE's      Mobility  Bed Mobility Overal bed mobility: Needs Assistance Bed Mobility: Rolling Rolling: Max assist         General bed mobility comments: Pt needs assist to bend LES and doesnt assist as much as anticipate with UEs.    Transfers                   General transfer comment: NT as pt declied to sit EOB as he and wife state that MD told him to stay in bed due to wound on buttocks.    Ambulation/Gait                  Stairs            Wheelchair Mobility     Tilt Bed    Modified Rankin (Stroke Patients Only)       Balance                                             Pertinent Vitals/Pain Pain Assessment Pain Assessment: Faces Faces Pain Scale: Hurts little more Pain Location: right  buttock wound Pain Descriptors / Indicators: Aching, Discomfort, Guarding, Grimacing Pain Intervention(s): Limited activity within patient's tolerance, Monitored during session, Repositioned    Home Living Family/patient expects to be discharged to:: Private residence Living Arrangements: Spouse/significant other Available Help at Discharge: Family;Available 24 hours/day Type of Home: House Home Access: Ramped entrance       Home Layout: One level Home Equipment: Wheelchair - manual;Hospital bed;Other (comment) (sliding board, rolling shower chair) Additional Comments: Slide board    Prior Function Prior Level of Function : Needs assist             Mobility Comments: Bedbound at baseline,  can slide board to w/c with heavy assist from family.  Patient might get to the wheelchiar 1/wk ADLs Comments: Spouse assists with bathing and dressing at bedlevel.  Patient is able to feed himself     Hand Dominance   Dominant Hand: Right    Extremity/Trunk Assessment   Upper Extremity Assessment Upper Extremity Assessment: Defer to OT evaluation    Lower Extremity Assessment RLE Deficits / Details: Previous SCI, P/ROM WFL: LLE Deficits / Details: Previous SCI, P/ROM [facility]       Communication   Communication: Prefers language other than English;Interpreter utilized [employer]  Sue Lush [phone])  Cognition Arousal/Alertness: Awake/alert Behavior During Therapy: WFL for tasks assessed/performed Overall Cognitive Status: Within Functional Limits for tasks assessed                                          General Comments General comments (skin integrity, edema, etc.): 124-130 bpm, 97% RA, 131/71    Exercises General Exercises - Lower Extremity Ankle Circles/Pumps: PROM, Both, 5 reps, Supine Heel Slides: PROM, Both, 10 reps, Supine   Assessment/Plan    PT Assessment Patient needs continued PT services  PT Problem List Decreased range of motion;Decreased activity tolerance;Decreased balance;Decreased mobility;Decreased knowledge of use of DME;Decreased safety awareness;Decreased knowledge of precautions       PT Treatment Interventions DME instruction;Functional mobility training;Therapeutic activities;Therapeutic exercise;Balance training;Patient/family education    PT Goals (Current goals can be found in the Care Plan section)  Acute Rehab PT Goals Patient Stated Goal: to get stronger PT Goal Formulation: With patient Time For Goal Achievement: 05/08/23 Potential to Achieve Goals: Fair    Frequency Min 1X/week     Co-evaluation               AM-PAC PT "6 Clicks" Mobility  Outcome Measure Help needed turning from your back to your side  while in a flat bed without using bedrails?: A Lot Help needed moving from lying on your back to sitting on the side of a flat bed without using bedrails?: Total Help needed moving to and from a bed to a chair (including a wheelchair)?: Total Help needed standing up from a chair using your arms (e.g., wheelchair or bedside chair)?: Total Help needed to walk in hospital room?: Total Help needed climbing 3-5 steps with a railing? : Total 6 Click Score: 7    End of Session   Activity Tolerance:  (self limiting) Patient left: in bed;with call bell/phone within reach;with bed alarm set;with family/visitor present Nurse Communication: Mobility status;Need for lift equipment PT Visit Diagnosis: Muscle weakness (generalized) (M62.81);Other abnormalities of gait and mobility (R26.89)    Time: 7829-5621 PT Time Calculation (min) (ACUTE ONLY): 30 min   Charges:   PT Evaluation $PT Eval Moderate  Complexity: 1 Mod PT Treatments $Therapeutic Exercise: 8-22 mins PT General Charges $$ ACUTE PT VISIT: 1 Visit         Ethal Gotay M,PT Acute Rehab Services [phone]   [name] 04/24/2023, 3:07 PM

## 2023-04-24 NOTE — Evaluation (Signed)
Occupational Therapy Evaluation Patient Details Name: John Meza MRN: [id number] DOB: 1961/03/23 Today's Date: 04/24/2023   History of Present Illness Patient is a 62 year old male admitted 7/12 presented with fever, N/V.  On presentation, lab work showed WBC count of 26,000, lactate of 3.  UA suspicious for pyuria vs. virus. PMH: paraplegia, neurogenic bladder requiring self cath, recurrent ESBL UTI, diabetes type 2, hypertension, chronic right buttock pressure ulcer He was recently hospitalized here in June for complicated UTI due to Klebsiella pneumonia, Morganella morganii and was discharged on Cipro.  Report of fever with nausea and vomiting.  On presentation, lab work showed WBC count of 26,000, lactate of 3.  UA suspicious for pyuria.   Clinical Impression   Pt currently in bed and politely declining to try sitting EOB.  He did assist with rolling for cleaning up of BM at mod assist level to the right and left side with total assist for hygiene.  Slight increased BUE weakness noted secondary to being in bed a lot at home with buttocks wound and not getting regular exercise.  Per pt and spouse she assists with all selfcare supine to sit and he was able to transfer without assist using his sliding board.  Feel he will benefit from acute care OT at this time to help increase strength and regain independence back to his functional baseline.  Do not anticipate post acute OT needs but will update if needed.        Recommendations for follow up therapy are one component of a multi-disciplinary discharge planning process, led by the attending physician.  Recommendations may be updated based on patient status, additional functional criteria and insurance authorization.   Assistance Recommended at Discharge Frequent or constant Supervision/Assistance  Patient can return home with the following Help with stairs or ramp for entrance;Assist for transportation;Assistance with  cooking/housework;A lot of help with bathing/dressing/bathroom;A lot of help with walking and/or transfers    Functional Status Assessment  Patient has had a recent decline in their functional status and demonstrates the ability to make significant improvements in function in a reasonable and predictable amount of time.  Equipment Recommendations  None recommended by OT       Precautions / Restrictions Precautions Precautions: Fall Precaution Comments: Wound to right buttocks Restrictions Weight Bearing Restrictions: No      Mobility Bed Mobility Overal bed mobility: Needs Assistance Bed Mobility: Rolling Rolling: Mod assist         General bed mobility comments: Pt needs assist to bend lower extremities, able to pull on rails with UEs    Transfers                   General transfer comment: Pt declined EOB today      Balance                                           ADL either performed or assessed with clinical judgement   ADL Overall ADL's : Needs assistance/impaired Eating/Feeding: Independent;Bed level   Grooming: Wash/dry hands;Wash/dry face;Set up;Bed level Grooming Details (indicate cue type and reason): simulated Upper Body Bathing: Set up;Bed level Upper Body Bathing Details (indicate cue type and reason): simulated Lower Body Bathing: Bed level;Moderate assistance Lower Body Bathing Details (indicate cue type and reason): simulated Upper Body Dressing : Moderate assistance;Bed level Upper Body Dressing Details (indicate cue type and  reason): simulated Lower Body Dressing: Total assistance;Bed level Lower Body Dressing Details (indicate cue type and reason): simulated     Toileting- Clothing Manipulation and Hygiene: Maximal assistance;Bed level       Functional mobility during ADLs: Moderate assistance (rolling side to side in the bed to clean up bowel incontinence) General ADL Comments: Pt able to roll in the bed with  use of the rails and mod assist for therapist to assist with bending LEs.  Completed cleaning and washing of buttocks in sidelying.  Pt declined sitting EOB.  Will attempt next visit.  Spouse assists with ADL tasks.  Pt will benefit from continued UE strengthening secondary to deconditioning from being in bed more often recently.     Vision Baseline Vision/History: 0 No visual deficits Ability to See in Adequate Light: 0 Adequate Patient Visual Report: No change from baseline Vision Assessment?: No apparent visual deficits     Perception  Not tested   Praxis  Not tested    Pertinent Vitals/Pain Pain Assessment Pain Assessment: Faces Faces Pain Scale: No hurt     Hand Dominance Right   Extremity/Trunk Assessment Upper Extremity Assessment Upper Extremity Assessment: Generalized weakness (shoulder flexion 3+/5, all other joints 4/5)   Lower Extremity Assessment Lower Extremity Assessment: Defer to PT evaluation       Communication Communication Communication: Prefers language other than [place];Interpreter utilized [employer]  Sue Lush [phone])   Cognition Arousal/Alertness: Awake/alert Behavior During Therapy: WFL for tasks assessed/performed Overall Cognitive Status: Within Functional Limits for tasks assessed                                                  Home Living Family/patient expects to be discharged to:: Private residence Living Arrangements: Spouse/significant other Available Help at Discharge: Family;Available 24 hours/day Type of Home: House Home Access: Ramped entrance     Home Layout: One level     Bathroom Shower/Tub: Producer, television/film/video: Standard Bathroom Accessibility: Yes   Home Equipment: Wheelchair - manual;Hospital bed;Other (comment) (sliding board, rolling shower chair)   Additional Comments: Slide board      Prior Functioning/Environment Prior Level of Function : Needs assist              Mobility Comments: Pt reports he could transfer with his board to the wheelchair without assistance but due to wound on buttocks he has been staying in bed a lot more.  Patient might get to the wheelchiar 1/wk ADLs Comments: Spouse assists with dressing at bedlevel was pushing his wheelchair into the roll in shower and taking sponge baths.  Patient is able to feed himself        OT Problem List: Decreased strength;Impaired balance (sitting and/or standing)      OT Treatment/Interventions: Therapeutic exercise;Balance training;Self-care/ADL training;Patient/family education;Therapeutic activities;DME and/or AE instruction;Neuromuscular education    OT Goals(Current goals can be found in the care plan section) Acute Rehab OT Goals Patient Stated Goal: Pt did not state this session OT Goal Formulation: With patient Time For Goal Achievement: 05/08/23 Potential to Achieve Goals: Good  OT Frequency: Min 1X/week       AM-PAC OT "6 Clicks" Daily Activity     Outcome Measure Help from another person eating meals?: None Help from another person taking care of personal grooming?: A Little Help from another person toileting, which includes  using toliet, bedpan, or urinal?: Total Help from another person bathing (including washing, rinsing, drying)?: A Lot Help from another person to put on and taking off regular upper body clothing?: A Lot Help from another person to put on and taking off regular lower body clothing?: Total 6 Click Score: 13   End of Session Nurse Communication: Other (comment) (Pt having BM)  Activity Tolerance: Patient tolerated treatment well Patient left: in bed;with call bell/phone within reach;with family/visitor present  OT Visit Diagnosis: Muscle weakness (generalized) (M62.81)                Time: 8469-6295 OT Time Calculation (min): 25 min Charges:  OT General Charges $OT Visit: 1 Visit OT Evaluation $OT Eval Moderate Complexity: 1 Mod OT Treatments $Self  Care/Home Management : 8-22 mins  Perrin Maltese, OTR/L Acute Rehabilitation Services  Office [phone] 04/24/2023

## 2023-04-25 DIAGNOSIS — D72829 Elevated white blood cell count, unspecified: Secondary | ICD-10-CM

## 2023-04-25 DIAGNOSIS — R509 Fever, unspecified: Secondary | ICD-10-CM

## 2023-04-25 LAB — CBC
HCT: 27.1 % — ABNORMAL LOW (ref 39.0–52.0)
Hemoglobin: 8.7 g/dL — ABNORMAL LOW (ref 13.0–17.0)
MCH: 27.7 pg (ref 26.0–34.0)
MCHC: 32.1 g/dL (ref 30.0–36.0)
MCV: 86.3 fL (ref 80.0–100.0)
Platelets: 458 10*3/uL — ABNORMAL HIGH (ref 150–400)
RBC: 3.14 MIL/uL — ABNORMAL LOW (ref 4.22–5.81)
RDW: 14.2 % (ref 11.5–15.5)
WBC: 17.7 10*3/uL — ABNORMAL HIGH (ref 4.0–10.5)
nRBC: 0 % (ref 0.0–0.2)

## 2023-04-25 LAB — BASIC METABOLIC PANEL
Anion gap: 9 (ref 5–15)
BUN: 22 mg/dL — ABNORMAL HIGH (ref 6–20)
CO2: 23 mmol/L (ref 22–32)
Calcium: 8.3 mg/dL — ABNORMAL LOW (ref 8.9–10.3)
Chloride: 96 mmol/L — ABNORMAL LOW (ref 98–111)
Creatinine, Ser: 0.83 mg/dL (ref 0.61–1.24)
GFR, Estimated: >60 mL/min (ref >60)
Glucose, Bld: 175 mg/dL — ABNORMAL HIGH (ref 70–99)
Potassium: 4.1 mmol/L (ref 3.5–5.1)
Sodium: 128 mmol/L — ABNORMAL LOW (ref 135–145)

## 2023-04-25 LAB — SEDIMENTATION RATE: Sed Rate: 60 mm/hr — ABNORMAL HIGH (ref 0–16)

## 2023-04-25 LAB — GLUCOSE, CAPILLARY
Glucose-Capillary: 148 mg/dL — ABNORMAL HIGH (ref 70–99)
Glucose-Capillary: 185 mg/dL — ABNORMAL HIGH (ref 70–99)
Glucose-Capillary: 185 mg/dL — ABNORMAL HIGH (ref 70–99)
Glucose-Capillary: 217 mg/dL — ABNORMAL HIGH (ref 70–99)

## 2023-04-25 LAB — C-REACTIVE PROTEIN: CRP: 19.5 mg/dL — ABNORMAL HIGH (ref <1.0)

## 2023-04-25 NOTE — Progress Notes (Signed)
[facility]    Date of Admission:  04/23/2023   Total days of antibiotics 2          ID: John Meza is a 62 y.o. male with neurogenic bladder, hx of complicated uti, T2DM, recently hospitalized for uti, now readmitted for ongoing fevers, infectious work up negative  Principal Problem:   Sepsis (HCC) Active Problems:   Controlled type 2 diabetes mellitus (HCC)   Paraplegia (HCC)   Hyponatremia   Essential hypertension   Complicated UTI (urinary tract infection)   Normocytic anemia   Hyperlipidemia associated with type 2 diabetes mellitus (HCC)    Subjective: Still having fevers up to 102F last night. Afebrile this morning and this afternoon. Patient feeling better  Leukocytosis slightly improved, no longer having associated tachycardia with fevers, 2nd dose of abtx yesterday  Medications:   atorvastatin  20 mg Oral Q1200   Chlorhexidine Gluconate Cloth  6 each Topical Q0600   docusate sodium  100 mg Oral BID   enoxaparin (LOVENOX) injection  40 mg Subcutaneous Q24H   famotidine  20 mg Oral BID   feeding supplement (GLUCERNA SHAKE)  237 mL Oral BID BM   insulin aspart  0-15 Units Subcutaneous TID WC   insulin aspart  0-5 Units Subcutaneous QHS   irbesartan  300 mg Oral Daily   leptospermum manuka honey  1 Application Topical Daily   multivitamin with minerals  1 tablet Oral Daily   nutrition supplement (JUVEN)  1 packet Oral BID BM   sodium chloride flush  3 mL Intravenous Q12H    Objective: Vital signs in last 24 hours: Temp:  [97.5 F (36.4 C)-102 F (38.9 C)] 97.5 F (36.4 C) (07/14 0837) Pulse Rate:  [80-118] 100 (07/14 1000) Resp:  [15-20] 17 (07/14 1000) BP: (110-137)/(64-74) 110/68 (07/14 0837) SpO2:  [63 %-100 %] 99 % (07/14 1610)  Physical Exam  Constitutional: He is oriented to person, place, and time. He appears well-developed and well-nourished. No distress.  HENT:  Mouth/Throat: Oropharynx is clear and  moist. No oropharyngeal exudate.  Cardiovascular: Normal rate, regular rhythm and normal heart sounds. Exam reveals no gallop and no friction rub.  No murmur heard.  Pulmonary/Chest: Effort normal and breath sounds normal. No respiratory distress. He has no wheezes.  Abdominal: Soft. Bowel sounds are normal. He exhibits no distension. There is no tenderness.  Lymphadenopathy:  He has no cervical adenopathy.  Neurological: He is alert and oriented to person, place, and time.  Skin: Skin is warm and dry. No rash noted. No erythema.  Psychiatric: He has a normal mood and affect. His behavior is normal.    Lab Results Recent Labs    04/24/23 0214 04/25/23 0144  WBC 17.8* 17.7*  HGB 7.3* 8.7*  HCT 21.4* 27.1*  NA 133* 128*  K 4.1 4.1  CL 98 96*  CO2 20* 23  BUN 15 22*  CREATININE 0.84 0.83   Liver Panel Recent Labs    04/23/23 0439  PROT 6.1*  ALBUMIN 1.9*  AST 28  ALT 29  ALKPHOS 117  BILITOT 0.6    Microbiology: Urine cx negative Blood cx negative Studies/Results: No results found.   Assessment/Plan: Febrile illness/FUO= work up thus is unrevealing. Ua, urine cx negative as well as blood cx. Cxr not suggestive of pneumonia. Recommend to hold off on any tylenol so that can track fever curve. Will add differential to see if seeing neutrophilic predominance. He was admitted with 5  days of fever on 6/30, and tx for esbl uti -received 5 day course of abtx from 6/30- 7/3 where temp went down to 100.30F however, he was discharged, then readmitted on 7/11 with leukocytosis and temp of 101F.  Consider further evaluation, such as repeating blood cx off of abtx.   Recommend to do abd/pelvis CT ,possibly looking for stones or renal abscess as cause of fevers. Will check sed rate and crp  [facility] Pager: [phone]  04/25/2023, 11:34 AM

## 2023-04-25 NOTE — Progress Notes (Signed)
PROGRESS NOTE  John Meza  WUJ:[id number] DOB: [birth year] DOA: 04/23/2023 PCP: Storm Frisk, MD   Brief Narrative: Patient is a 62 year old male with history of paraplegia, neurogenic bladder requiring self cath, recurrent ESBL UTI, diabetes type 2, hypertension, chronic right buttock pressure ulcer who presented with fever.  He was recently hospitalized here in June for complicated UTI due to Klebsiella pneumonia, Morganella morganii and was discharged on Cipro.  Report of fever with nausea and vomiting.  On presentation, lab work showed WBC count of 26,000, lactate of 3.  UA suspicious for pyuria.  Started on Merem.  Continues to be febrile intermittently.  Urine culture has not shown any growth  Assessment & Plan:  Principal Problem:   Sepsis (HCC) Active Problems:   Controlled type 2 diabetes mellitus (HCC)   Paraplegia (HCC)   Hyponatremia   Essential hypertension   Complicated UTI (urinary tract infection)   Normocytic anemia   Hyperlipidemia associated with type 2 diabetes mellitus (HCC)  Sepsis: Presented with leukocytosis, fever, tachycardia, dyspnea.  Elevated lactate.  Follow-up  blood culture.  Suspected UTI but urine culture did not show any growth.  Started on [place] .Procalcitonin elevated.  Continues to spike fever intermittently.  Fever of 102 Fahrenheit this morning.  Leukocytosis improving  Recurrent complicated UTI: History of ESBL E. coli UTI in February 2021, treated for Klebsiella/Morganella UTI a month ago.  Patient has neurogenic bladder, requires self cath.   Currently on Foley cath.  I think discharging him with Foley catheter is a good idea.  I discussed this with the patient and his wife.  Intermittent self cath has more risk for infection.  Urine culture did not show any growth  Nausea/vomiting: Was on ciprofloxacin on discharge from here which could have contributed.  Continue antiemetics.  Much better now  Diabetes type 2: Recent A1c of  7.  On Glucophage, Januvia at home.  Currently on sliding scale  Hypertension: Takes valsartan, hydrochlorothiazide at home.  HCTZ discontinued for hyponatremia  Hyperlipidemia: On Lipitor  Paraplegia: On wheelchair-bound.  Has neurogenic bladder.    Chronic right buttock pressure ulcer: Wound care following.  Currently does not appear infected.  Chronic hyponatremia: Currently at baseline  Hyponatremia: New problem.  Discontinued HCTZ   Normocytic  anemia: His baseline hemoglobin around 9-10.  Hemoglobin currently in the range of 8.  Denies any hematochezia or melena.  No evidence of acute blood loss.  Continue monitoring.      Nutrition Problem: Increased nutrient needs Etiology: wound healing Pressure Injury 04/11/23 Buttocks Right Unstageable - Full thickness tissue loss in which the base of the injury is covered by slough (yellow, tan, gray, green or brown) and/or eschar (tan, brown or black) in the wound bed. (Active)  04/11/23 1814  Location: Buttocks  Location Orientation: Right  Staging: Unstageable - Full thickness tissue loss in which the base of the injury is covered by slough (yellow, tan, gray, green or brown) and/or eschar (tan, brown or black) in the wound bed.  Wound Description (Comments):   Present on Admission: Yes  Dressing Type Foam - Lift dressing to assess site every shift 04/25/23 0820    DVT prophylaxis:enoxaparin (LOVENOX) injection 40 mg Start: 04/23/23 1000     Code Status: Full Code  Family Communication: Wife at bedside with help of the Spanish interpreter  Patient status: Inpatient  Patient is from : Home  Anticipated discharge to: Home  Estimated DC date: 1 to 2 days   Consultants: ID  Procedures: None  Antimicrobials:  Anti-infectives (From admission, onward)    Start     Dose/Rate Route Frequency Ordered Stop   04/24/23 1100  meropenem (MERREM) 1 g in sodium chloride 0.9 % 100 mL IVPB        1 g 200 mL/hr over 30 Minutes  Intravenous Every 8 hours 04/24/23 0953     04/23/23 0445  ceFEPIme (MAXIPIME) 2 g in sodium chloride 0.9 % 100 mL IVPB  Status:  Discontinued        2 g 200 mL/hr over 30 Minutes Intravenous  Once 04/23/23 0438 04/23/23 0443   04/23/23 0445  meropenem (MERREM) 1 g in sodium chloride 0.9 % 100 mL IVPB        1 g 200 mL/hr over 30 Minutes Intravenous  Once 04/23/23 0443 04/23/23 0550       Subjective: Patient seen and examined at bedside today.  Hemodynamically stable.  Fever of 102  Fahrenheit this morning but not during my evaluation.  Appears comfortable.  Denies dysuria.  Objective: Vitals:   04/25/23 0812 04/25/23 0822 04/25/23 0837 04/25/23 1000  BP:   110/68   Pulse: 93 80  100  Resp:    17  Temp:   (!) 97.5 F (36.4 C)   TempSrc:   Oral   SpO2: (!) 63% 99%      Intake/Output Summary (Last 24 hours) at 04/25/2023 1108 Last data filed at 04/25/2023 1048 Gross per 24 hour  Intake 2645.3 ml  Output 3250 ml  Net -604.7 ml   There were no vitals filed for this visit.  Examination:  General exam: Overall comfortable, not in distress HEENT: PERRL Respiratory system:  no wheezes or crackles  Cardiovascular system: S1 & S2 heard, RRR.  Gastrointestinal system: Abdomen is nondistended, soft and nontender. Central nervous system: Alert and oriented,paraplegia Extremities: No edema, no clubbing ,no cyanosis Skin: No rashes, no ulcers,no icterus   GU: Foley   Data Reviewed: I have personally reviewed following labs and imaging studies  CBC: Recent Labs  Lab 04/23/23 0439 04/24/23 0214 04/25/23 0144  WBC 23.4* 17.8* 17.7*  NEUTROABS 19.3*  --   --   HGB 8.9* 7.3* 8.7*  HCT 27.4* 21.4* 27.1*  MCV 85.9 83.9 86.3  PLT 463* 323 458*   Basic Metabolic Panel: Recent Labs  Lab 04/23/23 0439 04/24/23 0214 04/25/23 0144  NA 130* 133* 128*  K 3.7 4.1 4.1  CL 96* 98 96*  CO2 20* 20* 23  GLUCOSE 148* 102* 175*  BUN 16 15 22*  CREATININE 1.20 0.84 0.83  CALCIUM  8.3* 8.3* 8.3*     Recent Results (from the past 240 hour(s))  Resp panel by RT-PCR (RSV, Flu A&B, Covid) Anterior Nasal Swab     Status: None   Collection Time: 04/23/23  4:37 AM   Specimen: Anterior Nasal Swab  Result Value Ref Range Status   SARS Coronavirus 2 by RT PCR NEGATIVE NEGATIVE Final   Influenza A by PCR NEGATIVE NEGATIVE Final   Influenza B by PCR NEGATIVE NEGATIVE Final    Comment: (NOTE) The Xpert Xpress SARS-CoV-2/FLU/RSV plus assay is intended as an aid in the diagnosis of influenza from Nasopharyngeal swab specimens and should not be used as a sole basis for treatment. Nasal washings and aspirates are unacceptable for Xpert Xpress SARS-CoV-2/FLU/RSV testing.  Fact Sheet for Patients: BloggerCourse.com  Fact Sheet for Healthcare Providers: SeriousBroker.it  This test is not yet approved or cleared by the [place]  FDA and has been authorized for detection and/or diagnosis of SARS-CoV-2 by FDA under an Emergency Use Authorization (EUA). This EUA will remain in effect (meaning this test can be used) for the duration of the COVID-19 declaration under Section 564(b)(1) of the Act, 21 U.S.C. section 360bbb-3(b)(1), unless the authorization is terminated or revoked.     Resp Syncytial Virus by PCR NEGATIVE NEGATIVE Final    Comment: (NOTE) Fact Sheet for Patients: BloggerCourse.com  Fact Sheet for Healthcare Providers: SeriousBroker.it  This test is not yet approved or cleared by the [place] FDA and has been authorized for detection and/or diagnosis of SARS-CoV-2 by FDA under an Emergency Use Authorization (EUA). This EUA will remain in effect (meaning this test can be used) for the duration of the COVID-19 declaration under Section 564(b)(1) of the Act, 21 U.S.C. section 360bbb-3(b)(1), unless the authorization is terminated or revoked.  Performed  at [facility], [address] [place], [place] [address]   Blood Culture (routine x 2)     Status: None (Preliminary result)   Collection Time: 04/23/23  4:39 AM   Specimen: BLOOD LEFT ARM  Result Value Ref Range Status   Specimen Description BLOOD LEFT ARM  Final   Special Requests   Final    BOTTLES DRAWN AEROBIC AND ANAEROBIC Blood Culture results may not be optimal due to an excessive volume of blood received in culture bottles   Culture   Final    NO GROWTH 2 DAYS Performed at [facility], [address] [place], [place] [address]    Report Status PENDING  Incomplete  Blood Culture (routine x 2)     Status: None (Preliminary result)   Collection Time: 04/23/23  4:50 AM   Specimen: BLOOD  Result Value Ref Range Status   Specimen Description BLOOD SITE NOT SPECIFIED  Final   Special Requests   Final    BOTTLES DRAWN AEROBIC AND ANAEROBIC Blood Culture results may not be optimal due to an excessive volume of blood received in culture bottles   Culture   Final    NO GROWTH 2 DAYS Performed at [facility], [address] [place], [place] [address]    Report Status PENDING  Incomplete  Urine Culture     Status: None   Collection Time: 04/23/23  5:43 AM   Specimen: Urine, Random  Result Value Ref Range Status   Specimen Description URINE, RANDOM  Final   Special Requests NONE Reflexed from [id number]  Final   Culture   Final    NO GROWTH Performed at [facility], [address] [place], [place] [address]    Report Status 04/24/2023 FINAL  Final     Radiology Studies: No results found.  Scheduled Meds:  atorvastatin  20 mg Oral Q1200   Chlorhexidine Gluconate Cloth  6 each Topical Q0600   docusate sodium  100 mg Oral BID   enoxaparin (LOVENOX) injection  40 mg Subcutaneous Q24H   famotidine  20 mg Oral BID   feeding supplement (GLUCERNA SHAKE)  237 mL Oral BID BM   insulin aspart  0-15 Units Subcutaneous TID WC   insulin aspart  0-5 Units  Subcutaneous QHS   irbesartan  300 mg Oral Daily   leptospermum manuka honey  1 Application Topical Daily   multivitamin with minerals  1 tablet Oral Daily   nutrition supplement (JUVEN)  1 packet Oral BID BM   sodium chloride flush  3  mL Intravenous Q12H   Continuous Infusions:  meropenem (MERREM) IV 1 g (04/25/23 0337)     LOS: 2 days   Burnadette Pop, MD [facility] P7/14/2024, 11:08 AM

## 2023-04-26 ENCOUNTER — Inpatient Hospital Stay (HOSPITAL_COMMUNITY): Payer: Medicaid Other

## 2023-04-26 LAB — GLUCOSE, CAPILLARY
Glucose-Capillary: 175 mg/dL — ABNORMAL HIGH (ref 70–99)
Glucose-Capillary: 187 mg/dL — ABNORMAL HIGH (ref 70–99)
Glucose-Capillary: 202 mg/dL — ABNORMAL HIGH (ref 70–99)
Glucose-Capillary: 224 mg/dL — ABNORMAL HIGH (ref 70–99)

## 2023-04-26 LAB — BASIC METABOLIC PANEL
Anion gap: 7 (ref 5–15)
BUN: 27 mg/dL — ABNORMAL HIGH (ref 6–20)
CO2: 23 mmol/L (ref 22–32)
Calcium: 7.8 mg/dL — ABNORMAL LOW (ref 8.9–10.3)
Chloride: 97 mmol/L — ABNORMAL LOW (ref 98–111)
Creatinine, Ser: 0.79 mg/dL (ref 0.61–1.24)
GFR, Estimated: >60 mL/min (ref >60)
Glucose, Bld: 220 mg/dL — ABNORMAL HIGH (ref 70–99)
Potassium: 3.8 mmol/L (ref 3.5–5.1)
Sodium: 127 mmol/L — ABNORMAL LOW (ref 135–145)

## 2023-04-26 LAB — CBC
HCT: 23.4 % — ABNORMAL LOW (ref 39.0–52.0)
Hemoglobin: 7.7 g/dL — ABNORMAL LOW (ref 13.0–17.0)
MCH: 27.9 pg (ref 26.0–34.0)
MCHC: 32.9 g/dL (ref 30.0–36.0)
MCV: 84.8 fL (ref 80.0–100.0)
Platelets: 422 10*3/uL — ABNORMAL HIGH (ref 150–400)
RBC: 2.76 MIL/uL — ABNORMAL LOW (ref 4.22–5.81)
RDW: 14.1 % (ref 11.5–15.5)
WBC: 13.4 10*3/uL — ABNORMAL HIGH (ref 4.0–10.5)
nRBC: 0 % (ref 0.0–0.2)

## 2023-04-26 LAB — CULTURE, BLOOD (ROUTINE X 2): Culture: NO GROWTH

## 2023-04-26 LAB — SODIUM, URINE, RANDOM: Sodium, Ur: 71 mmol/L

## 2023-04-26 MED ORDER — VANCOMYCIN HCL 1500 MG/300ML IV SOLN
1500.0000 mg | INTRAVENOUS | Status: DC
  Administered 2023-04-27: 1500 mg via INTRAVENOUS
  Filled 2023-04-26 (×2): qty 300

## 2023-04-26 MED ORDER — SODIUM CHLORIDE 0.9 % IV SOLN: INTRAVENOUS | Status: DC

## 2023-04-26 MED ORDER — IOHEXOL 350 MG/ML SOLN
75.0000 mL | Freq: Once | INTRAVENOUS | Status: AC | PRN
  Administered 2023-04-26: 75 mL via INTRAVENOUS

## 2023-04-26 MED ORDER — VANCOMYCIN HCL 1750 MG/350ML IV SOLN
1750.0000 mg | Freq: Once | INTRAVENOUS | Status: AC
  Administered 2023-04-26: 1750 mg via INTRAVENOUS
  Filled 2023-04-26: qty 350

## 2023-04-26 NOTE — Progress Notes (Signed)
Pharmacy Antibiotic Note  John Meza is a 62 y.o. male admitted on 04/23/2023 with  possible UTI  now with concerns for sepsis. Pharmacy has been consulted for Meropenem and vancomycin dosing (with hx of ESBL UTI).   Previous hospitalization for complicated UTI, treated with meropenem and discharged on Cipro through 7/11. WBC trending down, Tmax 101.1, renal function trending down. CT abdomen with concerns for septic arthritis.  Plan: Vancomycin 1750 mg IV x1, followed by vancomycin 1500 mg IV Q24h (eAUC ~470, goal AUC 400-550, Scr 0.8, Vd 0.5) Meropenem 1g IV Q8h Trend WBC, fever, renal function F/u cultures, clinical progress, levels as indicated De-escalate when able    Temp (24hrs), Avg:99 F (37.2 C), Min:97.4 F (36.3 C), Max:101.1 F (38.4 C)  Recent Labs  Lab 04/23/23 0439 04/23/23 0446 04/23/23 0619 04/23/23 0706 04/23/23 0735 04/24/23 0214 04/25/23 0144 04/26/23 0257  WBC 23.4*  --   --   --   --  17.8* 17.7* 13.4*  CREATININE 1.20  --   --   --   --  0.84 0.83 0.79  LATICACIDVEN 2.0* 1.9 3.5* 1.9 1.4  --   --   --     CrCl cannot be calculated (Unknown ideal weight.).    Allergies  Allergen Reactions   Macrobid [Nitrofurantoin] Itching and Rash   Microbiology results: 7/12 BCx: NGTD 7/12 UCx: NGTD   Thank you for allowing pharmacy to be a part of this patient's care.  Thelma Barge, PharmD Clinical Pharmacist 04/26/2023 4:50 PM

## 2023-04-26 NOTE — Progress Notes (Signed)
[facility]   Reason for visit: Follow up on fever  Interval History: fever curve trending down on meropenem; WBC 13.4, Tmax 101.1 in the last 24 hours.   Day 5 meropenem  Physical Exam: Constitutional:  Vitals:   04/26/23 1142 04/26/23 1206  BP: (!) 99/49 110/71  Pulse:  (!) 104  Resp:  18  Temp: 99.3 F (37.4 C)   SpO2:     patient appears in NAD Respiratory: Normal respiratory effort  Review of Systems: Constitutional: negative for fevers and chills today  Lab Results  Component Value Date   WBC 13.4 (H) 04/26/2023   HGB 7.7 (L) 04/26/2023   HCT 23.4 (L) 04/26/2023   MCV 84.8 04/26/2023   PLT 422 (H) 04/26/2023    Lab Results  Component Value Date   CREATININE 0.79 04/26/2023   BUN 27 (H) 04/26/2023   NA 127 (L) 04/26/2023   K 3.8 04/26/2023   CL 97 (L) 04/26/2023   CO2 23 04/26/2023    Lab Results  Component Value Date   ALT 29 04/23/2023   AST 28 04/23/2023   ALKPHOS 117 04/23/2023     Microbiology: Recent Results (from the past 240 hour(s))  Resp panel by RT-PCR (RSV, Flu A&B, Covid) Anterior Nasal Swab     Status: None   Collection Time: 04/23/23  4:37 AM   Specimen: Anterior Nasal Swab  Result Value Ref Range Status   SARS Coronavirus 2 by RT PCR NEGATIVE NEGATIVE Final   Influenza A by PCR NEGATIVE NEGATIVE Final   Influenza B by PCR NEGATIVE NEGATIVE Final    Comment: (NOTE) The Xpert Xpress SARS-CoV-2/FLU/RSV plus assay is intended as an aid in the diagnosis of influenza from Nasopharyngeal swab specimens and should not be used as a sole basis for treatment. Nasal washings and aspirates are unacceptable for Xpert Xpress SARS-CoV-2/FLU/RSV testing.  Fact Sheet for Patients: BloggerCourse.com  Fact Sheet for Healthcare Providers: SeriousBroker.it  This test is not yet approved or cleared by the [place] FDA and has been authorized for detection and/or  diagnosis of SARS-CoV-2 by FDA under an Emergency Use Authorization (EUA). This EUA will remain in effect (meaning this test can be used) for the duration of the COVID-19 declaration under Section 564(b)(1) of the Act, 21 U.S.C. section 360bbb-3(b)(1), unless the authorization is terminated or revoked.     Resp Syncytial Virus by PCR NEGATIVE NEGATIVE Final    Comment: (NOTE) Fact Sheet for Patients: BloggerCourse.com  Fact Sheet for Healthcare Providers: SeriousBroker.it  This test is not yet approved or cleared by the [place] FDA and has been authorized for detection and/or diagnosis of SARS-CoV-2 by FDA under an Emergency Use Authorization (EUA). This EUA will remain in effect (meaning this test can be used) for the duration of the COVID-19 declaration under Section 564(b)(1) of the Act, 21 U.S.C. section 360bbb-3(b)(1), unless the authorization is terminated or revoked.  Performed at [facility], [address] [place], [place] [address]   Blood Culture (routine x 2)     Status: None (Preliminary result)   Collection Time: 04/23/23  4:39 AM   Specimen: BLOOD LEFT ARM  Result Value Ref Range Status   Specimen Description BLOOD LEFT ARM  Final   Special Requests   Final    BOTTLES DRAWN AEROBIC AND ANAEROBIC Blood Culture results may not be optimal due to an excessive volume of blood received in culture bottles   Culture  Final    NO GROWTH 3 DAYS Performed at [facility], [address] [place], [place] [address]    Report Status PENDING  Incomplete  Blood Culture (routine x 2)     Status: None (Preliminary result)   Collection Time: 04/23/23  4:50 AM   Specimen: BLOOD  Result Value Ref Range Status   Specimen Description BLOOD SITE NOT SPECIFIED  Final   Special Requests   Final    BOTTLES DRAWN AEROBIC AND ANAEROBIC Blood Culture results may not be optimal due to an excessive volume of blood  received in culture bottles   Culture   Final    NO GROWTH 3 DAYS Performed at [facility], [address] [place], [place] [address]    Report Status PENDING  Incomplete  Urine Culture     Status: None   Collection Time: 04/23/23  5:43 AM   Specimen: Urine, Random  Result Value Ref Range Status   Specimen Description URINE, RANDOM  Final   Special Requests NONE Reflexed from [id number]  Final   Culture   Final    NO GROWTH Performed at [facility], [address] [place], [place] [address]    Report Status 04/24/2023 FINAL  Final    Impression/Plan:  1. Fever - has been improving on meropenem but remains an unclear source.  Certainly the ESBL urinary infection possible and was partially treated with cipro but culture remained negative.  CT scan read still pending but will see if there is an underlying issue.  Otherwise will just continue with IV meropenem at this time.    2.  Leukocytosis - improving from the baseline on admission on antibiotics as above.    3.  N/V - possibly from the cipro and has improved of medication.

## 2023-04-26 NOTE — Progress Notes (Signed)
PROGRESS NOTE  John Meza  ZOX:[id number] DOB: 30-Jul-1961 DOA: 04/23/2023 PCP: Storm Frisk, MD   Brief Narrative: Patient is a 62 year old male with history of paraplegia, neurogenic bladder requiring self cath, recurrent ESBL UTI, diabetes type 2, hypertension, chronic right buttock pressure ulcer who presented with fever.  He was recently hospitalized here in June for complicated UTI due to Klebsiella pneumonia, Morganella morganii and was discharged on Cipro.  Report of fever with nausea and vomiting.  On presentation, lab work showed WBC count of 26,000, lactate of 3.  UA suspicious for pyuria.  Started on Merem.  Continues to be febrile intermittently.  Urine culture has not shown any growth  Assessment & Plan:  Principal Problem:   Sepsis (HCC) Active Problems:   Controlled type 2 diabetes mellitus (HCC)   Paraplegia (HCC)   Hyponatremia   Essential hypertension   Complicated UTI (urinary tract infection)   Normocytic anemia   Hyperlipidemia associated with type 2 diabetes mellitus (HCC)   Leukocytosis   Fever of unknown origin  Sepsis: Presented with leukocytosis, fever, tachycardia, dyspnea.  Elevated lactate.  Follow-up  blood culture.  Suspected UTI but urine culture did not show any growth.  Started on [place] .Procalcitonin elevated.  Continues to spike fever intermittently.  Fever of 101 Fahrenheit this morning.  Leukocytosis improving. Unclear etiology for fever.  Elevated CRP/ESR but not sure if this is helpful to look at the infection.  Getting CT abdomen/pelvis with contrast to rule out occult abscess or any other pathology for fever.  Denies any abdomen pain.  Chest is clear on auscultation.  Chest x-ray did not show pneumonia  Recurrent complicated UTI: History of ESBL E. coli UTI in February 2021, treated for Klebsiella/Morganella UTI a month ago.  Patient has neurogenic bladder, requires self cath.   Currently on Foley cath.  I think discharging him  with Foley catheter is a good idea.  I discussed this with the patient and his wife.  Intermittent self cath has more risk for infection.  Urine culture did not show any growth  Nausea/vomiting: Was on ciprofloxacin on discharge from here which could have contributed.  Continue antiemetics. Resolved  Diabetes type 2: Recent A1c of 7.  On Glucophage, Januvia at home.  Currently on sliding scale  Hypertension: Takes valsartan, hydrochlorothiazide at home.  HCTZ discontinued for hyponatremia  Hyperlipidemia: On Lipitor  Paraplegia: On wheelchair-bound.  Has neurogenic bladder.    Chronic right buttock pressure ulcer: Wound care following.  Currently does not appear infected.  Chronic hyponatremia: Currently at baseline  Hyponatremia: New problem.  Discontinued HCTZ .Will check bmp tomorrow  Normocytic  anemia: His baseline hemoglobin around 9-10.  Hemoglobin currently in the range of 7-8.  Denies any hematochezia or melena.  No evidence of acute blood loss.  Continue monitoring.      Nutrition Problem: Increased nutrient needs Etiology: wound healing Pressure Injury 04/11/23 Buttocks Right Unstageable - Full thickness tissue loss in which the base of the injury is covered by slough (yellow, tan, gray, green or brown) and/or eschar (tan, brown or black) in the wound bed. (Active)  04/11/23 1814  Location: Buttocks  Location Orientation: Right  Staging: Unstageable - Full thickness tissue loss in which the base of the injury is covered by slough (yellow, tan, gray, green or brown) and/or eschar (tan, brown or black) in the wound bed.  Wound Description (Comments):   Present on Admission: Yes  Dressing Type Foam - Lift dressing to assess site  every shift 04/26/23 0755    DVT prophylaxis:enoxaparin (LOVENOX) injection 40 mg Start: 04/23/23 1000     Code Status: Full Code  Family Communication: Wife/daughter at bedside  Patient status: Inpatient  Patient is from :  Home  Anticipated discharge to: Home  Estimated DC date:2-3 days   Consultants: ID  Procedures: None  Antimicrobials:  Anti-infectives (From admission, onward)    Start     Dose/Rate Route Frequency Ordered Stop   04/24/23 1100  meropenem (MERREM) 1 g in sodium chloride 0.9 % 100 mL IVPB        1 g 200 mL/hr over 30 Minutes Intravenous Every 8 hours 04/24/23 0953     04/23/23 0445  ceFEPIme (MAXIPIME) 2 g in sodium chloride 0.9 % 100 mL IVPB  Status:  Discontinued        2 g 200 mL/hr over 30 Minutes Intravenous  Once 04/23/23 0438 04/23/23 0443   04/23/23 0445  meropenem (MERREM) 1 g in sodium chloride 0.9 % 100 mL IVPB        1 g 200 mL/hr over 30 Minutes Intravenous  Once 04/23/23 0443 04/23/23 0550       Subjective: Patient seen and examined at bedside today.  Again febrile this morning of 101.  Afebrile during my evaluation.  He looks comfortable.  Denies abdomen pain, nausea or vomiting.  Denies dysuria.  No chest pain or shortness of breath or cough.  Objective: Vitals:   04/26/23 0351 04/26/23 0803 04/26/23 1142 04/26/23 1206  BP: 117/65 128/88 (!) 99/49 110/71  Pulse: 94   (!) 104  Resp: 13   18  Temp: 98.2 F (36.8 C) (!) 97.4 F (36.3 C) 99.3 F (37.4 C)   TempSrc: Oral Oral Oral   SpO2: 98%       Intake/Output Summary (Last 24 hours) at 04/26/2023 1301 Last data filed at 04/26/2023 0806 Gross per 24 hour  Intake 758.5 ml  Output 2200 ml  Net -1441.5 ml   There were no vitals filed for this visit.  Examination:  General exam: Overall comfortable, not in distress, paraplegia HEENT: PERRL Respiratory system:  no wheezes or crackles  Cardiovascular system: S1 & S2 heard, RRR.  Gastrointestinal system: Abdomen is nondistended, soft and nontender. Central nervous system: Alert and oriented Extremities: No edema, no clubbing ,no cyanosis Skin: No rashes, no icterus   GU: foley  Data Reviewed: I have personally reviewed following labs and imaging  studies  CBC: Recent Labs  Lab 04/23/23 0439 04/24/23 0214 04/25/23 0144 04/26/23 0257  WBC 23.4* 17.8* 17.7* 13.4*  NEUTROABS 19.3*  --   --   --   HGB 8.9* 7.3* 8.7* 7.7*  HCT 27.4* 21.4* 27.1* 23.4*  MCV 85.9 83.9 86.3 84.8  PLT 463* 323 458* 422*   Basic Metabolic Panel: Recent Labs  Lab 04/23/23 0439 04/24/23 0214 04/25/23 0144 04/26/23 0257  NA 130* 133* 128* 127*  K 3.7 4.1 4.1 3.8  CL 96* 98 96* 97*  CO2 20* 20* 23 23  GLUCOSE 148* 102* 175* 220*  BUN 16 15 22* 27*  CREATININE 1.20 0.84 0.83 0.79  CALCIUM 8.3* 8.3* 8.3* 7.8*     Recent Results (from the past 240 hour(s))  Resp panel by RT-PCR (RSV, Flu A&B, Covid) Anterior Nasal Swab     Status: None   Collection Time: 04/23/23  4:37 AM   Specimen: Anterior Nasal Swab  Result Value Ref Range Status   SARS Coronavirus 2 by RT  PCR NEGATIVE NEGATIVE Final   Influenza A by PCR NEGATIVE NEGATIVE Final   Influenza B by PCR NEGATIVE NEGATIVE Final    Comment: (NOTE) The Xpert Xpress SARS-CoV-2/FLU/RSV plus assay is intended as an aid in the diagnosis of influenza from Nasopharyngeal swab specimens and should not be used as a sole basis for treatment. Nasal washings and aspirates are unacceptable for Xpert Xpress SARS-CoV-2/FLU/RSV testing.  Fact Sheet for Patients: BloggerCourse.com  Fact Sheet for Healthcare Providers: SeriousBroker.it  This test is not yet approved or cleared by the [place] FDA and has been authorized for detection and/or diagnosis of SARS-CoV-2 by FDA under an Emergency Use Authorization (EUA). This EUA will remain in effect (meaning this test can be used) for the duration of the COVID-19 declaration under Section 564(b)(1) of the Act, 21 U.S.C. section 360bbb-3(b)(1), unless the authorization is terminated or revoked.     Resp Syncytial Virus by PCR NEGATIVE NEGATIVE Final    Comment: (NOTE) Fact Sheet for  Patients: BloggerCourse.com  Fact Sheet for Healthcare Providers: SeriousBroker.it  This test is not yet approved or cleared by the [place] FDA and has been authorized for detection and/or diagnosis of SARS-CoV-2 by FDA under an Emergency Use Authorization (EUA). This EUA will remain in effect (meaning this test can be used) for the duration of the COVID-19 declaration under Section 564(b)(1) of the Act, 21 U.S.C. section 360bbb-3(b)(1), unless the authorization is terminated or revoked.  Performed at [facility], [address], [place], [place] [address]   Blood Culture (routine x 2)     Status: None (Preliminary result)   Collection Time: 04/23/23  4:39 AM   Specimen: BLOOD LEFT ARM  Result Value Ref Range Status   Specimen Description BLOOD LEFT ARM  Final   Special Requests   Final    BOTTLES DRAWN AEROBIC AND ANAEROBIC Blood Culture results may not be optimal due to an excessive volume of blood received in culture bottles   Culture   Final    NO GROWTH 3 DAYS Performed at [facility], [address] [place], [place] [address]    Report Status PENDING  Incomplete  Blood Culture (routine x 2)     Status: None (Preliminary result)   Collection Time: 04/23/23  4:50 AM   Specimen: BLOOD  Result Value Ref Range Status   Specimen Description BLOOD SITE NOT SPECIFIED  Final   Special Requests   Final    BOTTLES DRAWN AEROBIC AND ANAEROBIC Blood Culture results may not be optimal due to an excessive volume of blood received in culture bottles   Culture   Final    NO GROWTH 3 DAYS Performed at [facility], [address], [place], [place] [address]    Report Status PENDING  Incomplete  Urine Culture     Status: None   Collection Time: 04/23/23  5:43 AM   Specimen: Urine, Random  Result Value Ref Range Status   Specimen Description URINE, RANDOM  Final   Special Requests NONE Reflexed from [id number]   Final   Culture   Final    NO GROWTH Performed at [facility], [address] [place], [place] [address]    Report Status 04/24/2023 FINAL  Final     Radiology Studies: No results found.  Scheduled Meds:  atorvastatin  20 mg Oral Q1200   Chlorhexidine Gluconate Cloth  6 each Topical Q0600   docusate sodium  100 mg Oral BID  enoxaparin (LOVENOX) injection  40 mg Subcutaneous Q24H   famotidine  20 mg Oral BID   feeding supplement (GLUCERNA SHAKE)  237 mL Oral BID BM   insulin aspart  0-15 Units Subcutaneous TID WC   insulin aspart  0-5 Units Subcutaneous QHS   irbesartan  300 mg Oral Daily   leptospermum manuka honey  1 Application Topical Daily   multivitamin with minerals  1 tablet Oral Daily   nutrition supplement (JUVEN)  1 packet Oral BID BM   sodium chloride flush  3 mL Intravenous Q12H   Continuous Infusions:  sodium chloride 75 mL/hr at 04/26/23 1001   meropenem (MERREM) IV 200 mL/hr at 04/26/23 0408     LOS: 3 days   Burnadette Pop, MD [facility] P7/15/2024, 1:01 PM

## 2023-04-26 NOTE — Plan of Care (Signed)

## 2023-04-26 NOTE — Progress Notes (Addendum)
Occupational Therapy Treatment Patient Details Name: John Meza MRN: [id number] DOB: 10-20-1960 Today's Date: 04/26/2023   History of present illness Patient is a 62 year old male admitted 7/12 presented with fever, N/V.  On presentation, lab work showed WBC count of 26,000, lactate of 3.  UA suspicious for pyuria vs. virus. PMH: paraplegia, neurogenic bladder requiring self cath, recurrent ESBL UTI, diabetes type 2, hypertension, chronic right buttock pressure ulcer He was recently hospitalized here in June for complicated UTI due to Klebsiella Meza, John Meza and was discharged on Cipro.  Report of fever with nausea and vomiting.  On presentation, lab work showed WBC count of 26,000, lactate of 3.  UA suspicious for pyuria.   OT comments  Pt worked on BUE strengthening exercises from supine with HOB elevated.  Handout and therapy band provided to work on outside of therapy.  Pt's HR elevated around 110 BPM in bed and with sitting EOB.  Spouse present and supportive.  Plan to continue progression and get pt up OOB next visit to his wheelchair.  Needs ongoing education on pressure relief secondary to his buttocks wound.  Recommend continued acute care OT at this time to get back to baseline level of function for ADLs.  No post acute OT recommended at this time.     Recommendations for follow up therapy are one component of a multi-disciplinary discharge planning process, led by the attending physician.  Recommendations may be updated based on patient status, additional functional criteria and insurance authorization.    Assistance Recommended at Discharge Frequent or constant Supervision/Assistance  Patient can return home with the following  Help with stairs or ramp for entrance;Assist for transportation;Assistance with cooking/housework;A lot of help with bathing/dressing/bathroom;A lot of help with walking and/or transfers   Equipment Recommendations  None  recommended by OT       Precautions / Restrictions Precautions Precautions: Fall Precaution Comments: Wound to right buttocks Restrictions Weight Bearing Restrictions: No       Mobility Bed Mobility Overal bed mobility: Needs Assistance Bed Mobility: Rolling, Supine to Sit, Sit to Supine Rolling: Mod assist   Supine to sit: Max assist Sit to supine: Mod assist   General bed mobility comments: Assist with all LE management and for bringing trunk up to sitting.  When laying down, he was able to manage his upper body but therapist assisted with managing LEs.    Transfers                         Balance Overall balance assessment: Needs assistance Sitting-balance support: Bilateral upper extremity supported, Feet supported Sitting balance-Leahy Scale: Poor Sitting balance - Comments: Pt needs UE support to maintain balance.                                   ADL either performed or assessed with clinical judgement   ADL Overall ADL's : Needs assistance/impaired                     Lower Body Dressing: Total assistance;Bed level Lower Body Dressing Details (indicate cue type and reason): for donning gripper socks             Functional mobility during ADLs: Maximal assistance (supine to sit EOB and for scooting up the EOB) General ADL Comments: Pt completed BUE therex exercises from supine during session with handout provided.  See exercise  section for details.  Also, had him complete supine to sit EOB with max assist and maintain sitting with BUE support and min guard assist for 5-6 mins.  Discussed the need for doing pressure relief when up in the chair with hopes to transition to his chair next visit.  He was positioned on his left side when therapist came in for session and was resting on his back at the end.  Nursing made aware and pt instructed to get assist to roll back on his right side after approximately one hour.      Cognition  Arousal/Alertness: Awake/alert Behavior During Therapy: WFL for tasks assessed/performed Overall Cognitive Status: Within Functional Limits for tasks assessed                                                     Pertinent Vitals/ Pain       Pain Assessment Pain Assessment: No/denies pain Pain Score: 0-No pain         Frequency  Min 1X/week        Progress Toward Goals  OT Goals(current goals can now be found in the care plan section)  Progress towards OT goals: Progressing toward goals  Acute Rehab OT Goals Patient Stated Goal: Pt agreeable to sitting EOB OT Goal Formulation: With patient/family Time For Goal Achievement: 05/08/23 Potential to Achieve Goals: Good  Plan Discharge plan remains appropriate       AM-PAC OT "6 Clicks" Daily Activity     Outcome Measure   Help from another person eating meals?: None Help from another person taking care of personal grooming?: A Little Help from another person toileting, which includes using toliet, bedpan, or urinal?: Total Help from another person bathing (including washing, rinsing, drying)?: A Lot Help from another person to put on and taking off regular upper body clothing?: A Lot Help from another person to put on and taking off regular lower body clothing?: Total 6 Click Score: 13    End of Session    OT Visit Diagnosis: Muscle weakness (generalized) (M62.81)   Activity Tolerance Patient tolerated treatment well   Patient Left in bed;with call bell/phone within reach;with family/visitor present   Nurse Communication Other (comment) (bed positioning at end of session)        Time: 2841-3244 OT Time Calculation (min): 39 min  Charges: OT General Charges $OT Visit: 1 Visit OT Treatments $Self Care/Home Management : 8-22 mins $Therapeutic Exercise: 23-37 mins  Perrin Maltese, OTR/L Acute Rehabilitation Services  Office [phone] 04/26/2023

## 2023-04-27 ENCOUNTER — Inpatient Hospital Stay: Payer: Self-pay

## 2023-04-27 ENCOUNTER — Inpatient Hospital Stay: Payer: Self-pay | Admitting: Critical Care Medicine

## 2023-04-27 ENCOUNTER — Inpatient Hospital Stay (HOSPITAL_COMMUNITY): Payer: Medicaid Other

## 2023-04-27 HISTORY — PX: IR US GUIDE BX ASP/DRAIN: IMG2392

## 2023-04-27 LAB — CBC
HCT: 23.4 % — ABNORMAL LOW (ref 39.0–52.0)
Hemoglobin: 7.5 g/dL — ABNORMAL LOW (ref 13.0–17.0)
MCH: 27.8 pg (ref 26.0–34.0)
MCHC: 32.1 g/dL (ref 30.0–36.0)
MCV: 86.7 fL (ref 80.0–100.0)
Platelets: 431 10*3/uL — ABNORMAL HIGH (ref 150–400)
RBC: 2.7 MIL/uL — ABNORMAL LOW (ref 4.22–5.81)
RDW: 14.3 % (ref 11.5–15.5)
WBC: 16.2 10*3/uL — ABNORMAL HIGH (ref 4.0–10.5)
nRBC: 0 % (ref 0.0–0.2)

## 2023-04-27 LAB — SYNOVIAL CELL COUNT + DIFF, W/ CRYSTALS
Crystals, Fluid: NONE SEEN
Other Cells-SYN: UNDETERMINED
WBC, Synovial: 435000 /mm3 — ABNORMAL HIGH (ref 0–200)

## 2023-04-27 LAB — GLUCOSE, CAPILLARY
Glucose-Capillary: 195 mg/dL — ABNORMAL HIGH (ref 70–99)
Glucose-Capillary: 168 mg/dL — ABNORMAL HIGH (ref 70–99)
Glucose-Capillary: 158 mg/dL — ABNORMAL HIGH (ref 70–99)
Glucose-Capillary: 225 mg/dL — ABNORMAL HIGH (ref 70–99)

## 2023-04-27 LAB — BASIC METABOLIC PANEL
Anion gap: 5 (ref 5–15)
BUN: 32 mg/dL — ABNORMAL HIGH (ref 6–20)
CO2: 21 mmol/L — ABNORMAL LOW (ref 22–32)
Calcium: 7.6 mg/dL — ABNORMAL LOW (ref 8.9–10.3)
Chloride: 100 mmol/L (ref 98–111)
Creatinine, Ser: 0.7 mg/dL (ref 0.61–1.24)
GFR, Estimated: >60 mL/min (ref >60)
Glucose, Bld: 210 mg/dL — ABNORMAL HIGH (ref 70–99)
Potassium: 3.9 mmol/L (ref 3.5–5.1)
Sodium: 126 mmol/L — ABNORMAL LOW (ref 135–145)

## 2023-04-27 MED ORDER — INSULIN ASPART 100 UNIT/ML IJ SOLN
3.0000 [IU] | Freq: Three times a day (TID) | INTRAMUSCULAR | Status: DC
  Administered 2023-04-27 – 2023-05-05 (×16): 3 [IU] via SUBCUTANEOUS

## 2023-04-27 MED ORDER — LIDOCAINE HCL 1 % IJ SOLN
INTRAMUSCULAR | Status: AC
  Filled 2023-04-27: qty 20

## 2023-04-27 NOTE — Progress Notes (Signed)
[facility]   Reason for visit: Follow up on fever  Interval History: fever continues with Tmax 101.1, WBC 16.2.  CT scan with concern for septic arthritis of the right hip.  Right leg has been swollen Day 6 antibiotics Day 2 vancomycin  Physical Exam: Constitutional:  Vitals:   04/27/23 0505 04/27/23 0734  BP: 129/64 125/64  Pulse: 93 95  Resp: 16 20  Temp: 99.6 F (37.6 C) 99 F (37.2 C)  SpO2: 96% 97%   patient appears in NAD Respiratory: Normal respiratory effort MS: right leg + edema  Review of Systems: Constitutional: negative for fevers and chills  Lab Results  Component Value Date   WBC 16.2 (H) 04/27/2023   HGB 7.5 (L) 04/27/2023   HCT 23.4 (L) 04/27/2023   MCV 86.7 04/27/2023   PLT 431 (H) 04/27/2023    Lab Results  Component Value Date   CREATININE 0.70 04/27/2023   BUN 32 (H) 04/27/2023   NA 126 (L) 04/27/2023   K 3.9 04/27/2023   CL 100 04/27/2023   CO2 21 (L) 04/27/2023    Lab Results  Component Value Date   ALT 29 04/23/2023   AST 28 04/23/2023   ALKPHOS 117 04/23/2023     Microbiology: Recent Results (from the past 240 hour(s))  Resp panel by RT-PCR (RSV, Flu A&B, Covid) Anterior Nasal Swab     Status: None   Collection Time: 04/23/23  4:37 AM   Specimen: Anterior Nasal Swab  Result Value Ref Range Status   SARS Coronavirus 2 by RT PCR NEGATIVE NEGATIVE Final   Influenza A by PCR NEGATIVE NEGATIVE Final   Influenza B by PCR NEGATIVE NEGATIVE Final    Comment: (NOTE) The Xpert Xpress SARS-CoV-2/FLU/RSV plus assay is intended as an aid in the diagnosis of influenza from Nasopharyngeal swab specimens and should not be used as a sole basis for treatment. Nasal washings and aspirates are unacceptable for Xpert Xpress SARS-CoV-2/FLU/RSV testing.  Fact Sheet for Patients: BloggerCourse.com  Fact Sheet for Healthcare Providers: SeriousBroker.it  This test is  not yet approved or cleared by the [place] FDA and has been authorized for detection and/or diagnosis of SARS-CoV-2 by FDA under an Emergency Use Authorization (EUA). This EUA will remain in effect (meaning this test can be used) for the duration of the COVID-19 declaration under Section 564(b)(1) of the Act, 21 U.S.C. section 360bbb-3(b)(1), unless the authorization is terminated or revoked.     Resp Syncytial Virus by PCR NEGATIVE NEGATIVE Final    Comment: (NOTE) Fact Sheet for Patients: BloggerCourse.com  Fact Sheet for Healthcare Providers: SeriousBroker.it  This test is not yet approved or cleared by the [place] FDA and has been authorized for detection and/or diagnosis of SARS-CoV-2 by FDA under an Emergency Use Authorization (EUA). This EUA will remain in effect (meaning this test can be used) for the duration of the COVID-19 declaration under Section 564(b)(1) of the Act, 21 U.S.C. section 360bbb-3(b)(1), unless the authorization is terminated or revoked.  Performed at [facility], [address], [place], [place] [address]   Blood Culture (routine x 2)     Status: None (Preliminary result)   Collection Time: 04/23/23  4:39 AM   Specimen: BLOOD LEFT ARM  Result Value Ref Range Status   Specimen Description BLOOD LEFT ARM  Final   Special Requests   Final    BOTTLES DRAWN AEROBIC AND ANAEROBIC Blood Culture results may not be optimal  due to an excessive volume of blood received in culture bottles   Culture   Final    NO GROWTH 4 DAYS Performed at [facility], [address] [place], [place] [address]    Report Status PENDING  Incomplete  Blood Culture (routine x 2)     Status: None (Preliminary result)   Collection Time: 04/23/23  4:50 AM   Specimen: BLOOD  Result Value Ref Range Status   Specimen Description BLOOD SITE NOT SPECIFIED  Final   Special Requests   Final    BOTTLES DRAWN  AEROBIC AND ANAEROBIC Blood Culture results may not be optimal due to an excessive volume of blood received in culture bottles   Culture   Final    NO GROWTH 4 DAYS Performed at [facility], [address] [place], [place] [address]    Report Status PENDING  Incomplete  Urine Culture     Status: None   Collection Time: 04/23/23  5:43 AM   Specimen: Urine, Random  Result Value Ref Range Status   Specimen Description URINE, RANDOM  Final   Special Requests NONE Reflexed from [id number]  Final   Culture   Final    NO GROWTH Performed at [facility], [address] [place], [place] [address]    Report Status 04/24/2023 FINAL  Final    Impression/Plan:  1. Fever - unclear etiology but concern for septic arthritis based on CT scan.  He remains febrile despite antibiotics and WBC back up some today.   Agree with vancomycin pending aspiration and evaluation by orthopedics, if indicated.   Discussed with the patient and wife at the bedside.   2.  Medication monitoring - creat wnl and will continue to monitor on vancomycin.   3.  N/v - resolved and no further issues.  Eating ok.

## 2023-04-27 NOTE — Progress Notes (Signed)
Physical Therapy Treatment Patient Details Name: John Meza MRN: [id number] DOB: 12-10-1960 Today's Date: 04/27/2023   History of Present Illness Patient is a 62 year old male admitted 7/12 presented with fever, N/V.  On presentation, lab work showed WBC count of 26,000, lactate of 3.  UA suspicious for pyuria vs. virus. PMH: paraplegia, neurogenic bladder requiring self cath, recurrent ESBL UTI, diabetes type 2, hypertension, chronic right buttock pressure ulcer He was recently hospitalized here in June for complicated UTI due to Klebsiella pneumonia, Morganella morganii and was discharged on Cipro.  Report of fever with nausea and vomiting.  On presentation, lab work showed WBC count of 26,000, lactate of 3.  UA suspicious for pyuria.    PT Comments  Patient resting in bed and agreeable to therapy session; wife present at bedside. Video interpreter not working effectively and telephone interpreter utilized for session. Mod assist provided to complete rolling and Max assist to guide LE's off EOB and raise trunk. Pt able to maintain balance with min guard for lateral weight shifts with forearm prop and for triceps press ups for UE strengthening and pressure relief technique. Pt had 1x LOB with triceps press ups and min assist required to correct. EOS pt repositioned with in partial chair position in bed, call bell within reach, and wife present. Will continue to progress as able.    Assistance Recommended at Discharge Frequent or constant Supervision/Assistance  If plan is discharge home, recommend the following:  Can travel by private vehicle    A lot of help with walking and/or transfers;A lot of help with bathing/dressing/bathroom;Assistance with cooking/housework;Direct supervision/assist for medications management;Assist for transportation;Help with stairs or ramp for entrance      Equipment Recommendations   (Air mattress overlay due to buttock wound, hoyer lift)     Recommendations for Other Services       Precautions / Restrictions Precautions Precautions: Fall Precaution Comments: Wound to right buttocks Restrictions Weight Bearing Restrictions: No Other Position/Activity Restrictions: Patient has no feeling or AROM to B LE's     Mobility  Bed Mobility Overal bed mobility: Needs Assistance Bed Mobility: Rolling, Sidelying to Sit, Sit to Sidelying Rolling: Mod assist Sidelying to sit: Max assist     Sit to sidelying: Mod assist    Mod assit to reach for bed rail and roll Lt with assist needed to bring lower trunk to sidelying. pt required Max assist to bring LE's off EOB fully and raise trunk upright. pt able to maintain seated balance with bil UE support EOB. Pt completed weight shifting activities at EOB and Mod assist to return to supine EOS with +2 to boost superiorly to reposition in bed.   Transfers                        Ambulation/Gait                   Stairs             Wheelchair Mobility     Tilt Bed    Modified Rankin (Stroke Patients Only)       Balance                                            Cognition Arousal/Alertness: Awake/alert Behavior During Therapy: WFL for tasks assessed/performed Overall Cognitive Status: Within Functional Limits for tasks assessed  Exercises Other Exercises Other Exercises: tricep press ups seated EOB, 10x with hand elevated on firm fodled blankets Other Exercises: 10x forearm prop bil with press up to return to midline sitting Other Exercises: 1x bil slide board placement with forearm prop to allow placement for part task activity    General Comments        Pertinent Vitals/Pain Pain Assessment Pain Assessment: Faces Faces Pain Scale: Hurts little more Pain Location: Lt arm wtih forearm prop Pain Descriptors / Indicators: Discomfort, Grimacing Pain Intervention(s):  Limited activity within patient's tolerance, Monitored during session, Repositioned    Home Living                          Prior Function            PT Goals (current goals can now be found in the care plan section) Acute Rehab PT Goals Patient Stated Goal: to get stronger PT Goal Formulation: With patient Time For Goal Achievement: 05/08/23 Potential to Achieve Goals: Fair Progress towards PT goals: Progressing toward goals    Frequency    Min 1X/week      PT Plan Current plan remains appropriate    Co-evaluation              AM-PAC PT "6 Clicks" Mobility   Outcome Measure  Help needed turning from your back to your side while in a flat bed without using bedrails?: A Lot Help needed moving from lying on your back to sitting on the side of a flat bed without using bedrails?: Total Help needed moving to and from a bed to a chair (including a wheelchair)?: Total Help needed standing up from a chair using your arms (e.g., wheelchair or bedside chair)?: Total Help needed to walk in hospital room?: Total Help needed climbing 3-5 steps with a railing? : Total 6 Click Score: 7    End of Session   Activity Tolerance: Patient tolerated treatment well Patient left: in bed;with call bell/phone within reach;with bed alarm set;with family/visitor present Nurse Communication: Mobility status;Need for lift equipment PT Visit Diagnosis: Muscle weakness (generalized) (M62.81);Other abnormalities of gait and mobility (R26.89)     Time: 4782-9562 PT Time Calculation (min) (ACUTE ONLY): 35 min  Charges:    $Therapeutic Activity: 23-37 mins PT General Charges $$ ACUTE PT VISIT: 1 Visit                     Wynn Maudlin, DPT Acute Rehabilitation Services Office [phone]  04/27/23 5:21 PM

## 2023-04-27 NOTE — Inpatient Diabetes Management (Signed)
Inpatient Diabetes Program Recommendations  AACE/ADA: New Consensus Statement on Inpatient Glycemic Control   Target Ranges:  Prepandial:   less than 140 mg/dL      Peak postprandial:   less than 180 mg/dL (1-2 hours)      Critically ill patients:  140 - 180 mg/dL    Latest Reference Range & Units 04/26/23 08:07 04/26/23 11:47 04/26/23 16:01 04/26/23 21:55 04/27/23 08:09  Glucose-Capillary 70 - 99 mg/dL 811 (H) 914 (H) 782 (H) 224 (H) 195 (H)   Review of Glycemic Control  Diabetes history: DM2 Outpatient Diabetes medications: Metformin 1000 mg BID, Januvia 100 mg daily Current orders for Inpatient glycemic control: Novolog 0-15 units TID with meals, Novolog 0-5 units QHS  Inpatient Diabetes Program Recommendations:    Insulin: Please consider ordering Novolog 3 units TID with meals for meal coverage if patient eats at least 50% of meals.  Thanks, Orlando Penner, RN, MSN, CDCES Diabetes Coordinator Inpatient Diabetes Program [phone] (Team Pager from 8am to 5pm)

## 2023-04-27 NOTE — Progress Notes (Signed)
Critical lab called by Danielle/Lab at 1702---synnovial fluid of right great hip has abundant WBC's and rare gram negative rods---Dr Renford Dills notified

## 2023-04-27 NOTE — Procedures (Signed)
Interventional Radiology Procedure Note  Procedure: [place] RT HIP ASP    Complications: None  Estimated Blood Loss:  0  Findings: 25CC EXUDATIVE FLD LABS AND CX SENT     Sharen Counter, MD

## 2023-04-27 NOTE — Progress Notes (Addendum)
PROGRESS NOTE  John Meza  ZOX:[id number] DOB: [birth year] DOA: 04/23/2023 PCP: Storm Frisk, MD   Brief Narrative: Patient is a 62 year old male with history of paraplegia, neurogenic bladder requiring self cath, recurrent ESBL UTI, diabetes type 2, hypertension, chronic right buttock pressure ulcer who presented with fever.  He was recently hospitalized here in June for complicated UTI due to Klebsiella pneumonia, Morganella morganii and was discharged on Cipro.  Report of fever with nausea and vomiting.  On presentation, lab work showed WBC count of 26,000, lactate of 3.  UA suspicious for pyuria.  Started on Merem.  Continued to be febrile intermittently.  Urine culture has not shown any growth.  CT abdomen/pelvis showed large collection around the right hip.  IR consulted for drain.  Orthopedics also consulted  Assessment & Plan:  Principal Problem:   Sepsis (HCC) Active Problems:   Controlled type 2 diabetes mellitus (HCC)   Paraplegia (HCC)   Hyponatremia   Essential hypertension   Complicated UTI (urinary tract infection)   Normocytic anemia   Hyperlipidemia associated with type 2 diabetes mellitus (HCC)   Leukocytosis   Fever of unknown origin  Sepsis/collection around right hip: Presented with leukocytosis, fever, tachycardia, dyspnea.  Elevated lactate.  Blood cultures not showing any growth.  Suspected UTI but urine culture did not show any growth.  Started on [place] .Procalcitonin found to be elevated.  Continued to spike fever intermittently.. Unclear etiology for fever.  Denies any abdomen pain.  Chest is clear on auscultation.  Chest x-ray did not show pneumonia. Finally CT abdomen/pelvis showed large collection on the right hip.  Right hip area also found to be edematous.  Patient is paraplegic and does not have any sensation show did not complain of any pain. Orthopedics consulted, discussed with Valeta Harms who I have requested for  consultation. Orthopedics recommended IR evaluation for drain.  Will follow-up fluid analysis.  Antibiotics broadened by adding vancomycin.  ID following  Recurrent complicated UTI: History of ESBL E. coli UTI in February 2021, treated for Klebsiella/Morganella UTI a month ago.  Patient has neurogenic bladder, requires self cath.   Currently on Foley cath.  I think discharging him with Foley catheter is a good idea.  I discussed this with the patient and his wife.  Intermittent self cath has more risk for infection.  Urine culture did not show any growth  Nausea/vomiting: Was on ciprofloxacin on discharge from here which could have contributed.  Continue antiemetics. Resolved  Diabetes type 2: Recent A1c of 7.  On Glucophage, Januvia at home.  Currently on sliding scale  Hypertension: Takes valsartan, hydrochlorothiazide at home.  HCTZ discontinued for hyponatremia  Hyperlipidemia: On Lipitor  Paraplegia: On wheelchair-bound.  Has neurogenic bladder.    Chronic right buttock pressure ulcer: Wound care following.  Currently does not appear infected.  Hyponatremia: Looks like this is a chronic problem: On reviewing his previous lab works, he is sodium has been in the range of 120s.  Discontinue hydrochlorothiazide.  Monitor.  Can consider adding salt tablets if sodium level does not improve  Normocytic  anemia: His baseline hemoglobin around 9-10.  Hemoglobin currently in the range of 7-8.  Denies any hematochezia or melena.  No evidence of acute blood loss.  Continue monitoring.   Addendum: Fluid aspirate showing plenty of leucocytes and gram negative rods, Dr Lajoyce Corners notified,he is planning to see him  in AM   Nutrition Problem: Increased nutrient needs Etiology: wound healing Pressure Injury 04/11/23 Buttocks Right  Unstageable - Full thickness tissue loss in which the base of the injury is covered by slough (yellow, tan, gray, green or brown) and/or eschar (tan, brown or black) in the wound  bed. (Active)  04/11/23 1814  Location: Buttocks  Location Orientation: Right  Staging: Unstageable - Full thickness tissue loss in which the base of the injury is covered by slough (yellow, tan, gray, green or brown) and/or eschar (tan, brown or black) in the wound bed.  Wound Description (Comments):   Present on Admission: Yes  Dressing Type Foam - Lift dressing to assess site every shift 04/27/23 0734    DVT prophylaxis:enoxaparin (LOVENOX) injection 40 mg Start: 04/23/23 1000     Code Status: Full Code  Family Communication: Wife at bedside with the help of the Spanish interpreter  Patient status: Inpatient  Patient is from : Home  Anticipated discharge to: Home  Estimated DC date:2-3 days   Consultants: ID, orthopedics  Procedures: None  Antimicrobials:  Anti-infectives (From admission, onward)    Start     Dose/Rate Route Frequency Ordered Stop   04/27/23 1800  vancomycin (VANCOREADY) IVPB 1500 mg/300 mL        1,500 mg 150 mL/hr over 120 Minutes Intravenous Every 24 hours 04/26/23 1656     04/26/23 1730  vancomycin (VANCOREADY) IVPB 1750 mg/350 mL        1,750 mg 175 mL/hr over 120 Minutes Intravenous  Once 04/26/23 1656 04/26/23 1922   04/24/23 1100  meropenem (MERREM) 1 g in sodium chloride 0.9 % 100 mL IVPB        1 g 200 mL/hr over 30 Minutes Intravenous Every 8 hours 04/24/23 0953     04/23/23 0445  ceFEPIme (MAXIPIME) 2 g in sodium chloride 0.9 % 100 mL IVPB  Status:  Discontinued        2 g 200 mL/hr over 30 Minutes Intravenous  Once 04/23/23 0438 04/23/23 0443   04/23/23 0445  meropenem (MERREM) 1 g in sodium chloride 0.9 % 100 mL IVPB        1 g 200 mL/hr over 30 Minutes Intravenous  Once 04/23/23 0443 04/23/23 0550       Subjective: Patient seen and examined at bedside today.  Appears comfortable.  Communication done with the help of Spanish interpreter through video.  He does not have any sensation on the body parts below the waist.  Denies any  complaints today.  He was afebrile this morning.  Remains on mild sinus tachycardia. Objective: Vitals:   04/26/23 2209 04/27/23 0014 04/27/23 0505 04/27/23 0734  BP:  114/62 129/64 125/64  Pulse: (!) 110 97 93 95  Resp: 20 (!) 23 16 20   Temp: 98.7 F (37.1 C) 98.8 F (37.1 C) 99.6 F (37.6 C) 99 F (37.2 C)  TempSrc:  Oral Oral Oral  SpO2: 97% 98% 96% 97%    Intake/Output Summary (Last 24 hours) at 04/27/2023 1343 Last data filed at 04/27/2023 0800 Gross per 24 hour  Intake 1940.91 ml  Output 2900 ml  Net -959.09 ml   There were no vitals filed for this visit.  Examination:   General exam: Overall comfortable, not in distress, HEENT: PERRL Respiratory system:  no wheezes or crackles  Cardiovascular system: S1 & S2 heard, RRR.  Gastrointestinal system: Abdomen is nondistended, soft and nontender. Central nervous system: Alert and oriented, paraplegic Extremities: Edema of the right hip Skin: No rashes, no icterus, shallow/uninfected ulcer on the right buttock GU: Foley   Data  Reviewed: I have personally reviewed following labs and imaging studies  CBC: Recent Labs  Lab 04/23/23 0439 04/24/23 0214 04/25/23 0144 04/26/23 0257 04/27/23 0008  WBC 23.4* 17.8* 17.7* 13.4* 16.2*  NEUTROABS 19.3*  --   --   --   --   HGB 8.9* 7.3* 8.7* 7.7* 7.5*  HCT 27.4* 21.4* 27.1* 23.4* 23.4*  MCV 85.9 83.9 86.3 84.8 86.7  PLT 463* 323 458* 422* 431*   Basic Metabolic Panel: Recent Labs  Lab 04/23/23 0439 04/24/23 0214 04/25/23 0144 04/26/23 0257 04/27/23 0008  NA 130* 133* 128* 127* 126*  K 3.7 4.1 4.1 3.8 3.9  CL 96* 98 96* 97* 100  CO2 20* 20* 23 23 21*  GLUCOSE 148* 102* 175* 220* 210*  BUN 16 15 22* 27* 32*  CREATININE 1.20 0.84 0.83 0.79 0.70  CALCIUM 8.3* 8.3* 8.3* 7.8* 7.6*     Recent Results (from the past 240 hour(s))  Resp panel by RT-PCR (RSV, Flu A&B, Covid) Anterior Nasal Swab     Status: None   Collection Time: 04/23/23  4:37 AM   Specimen:  Anterior Nasal Swab  Result Value Ref Range Status   SARS Coronavirus 2 by RT PCR NEGATIVE NEGATIVE Final   Influenza A by PCR NEGATIVE NEGATIVE Final   Influenza B by PCR NEGATIVE NEGATIVE Final    Comment: (NOTE) The Xpert Xpress SARS-CoV-2/FLU/RSV plus assay is intended as an aid in the diagnosis of influenza from Nasopharyngeal swab specimens and should not be used as a sole basis for treatment. Nasal washings and aspirates are unacceptable for Xpert Xpress SARS-CoV-2/FLU/RSV testing.  Fact Sheet for Patients: BloggerCourse.com  Fact Sheet for Healthcare Providers: SeriousBroker.it  This test is not yet approved or cleared by the [place] FDA and has been authorized for detection and/or diagnosis of SARS-CoV-2 by FDA under an Emergency Use Authorization (EUA). This EUA will remain in effect (meaning this test can be used) for the duration of the COVID-19 declaration under Section 564(b)(1) of the Act, 21 U.S.C. section 360bbb-3(b)(1), unless the authorization is terminated or revoked.     Resp Syncytial Virus by PCR NEGATIVE NEGATIVE Final    Comment: (NOTE) Fact Sheet for Patients: BloggerCourse.com  Fact Sheet for Healthcare Providers: SeriousBroker.it  This test is not yet approved or cleared by the [place] FDA and has been authorized for detection and/or diagnosis of SARS-CoV-2 by FDA under an Emergency Use Authorization (EUA). This EUA will remain in effect (meaning this test can be used) for the duration of the COVID-19 declaration under Section 564(b)(1) of the Act, 21 U.S.C. section 360bbb-3(b)(1), unless the authorization is terminated or revoked.  Performed at [facility], [address] [place], [place] [address]   Blood Culture (routine x 2)     Status: None (Preliminary result)   Collection Time: 04/23/23  4:39 AM   Specimen: BLOOD LEFT  ARM  Result Value Ref Range Status   Specimen Description BLOOD LEFT ARM  Final   Special Requests   Final    BOTTLES DRAWN AEROBIC AND ANAEROBIC Blood Culture results may not be optimal due to an excessive volume of blood received in culture bottles   Culture   Final    NO GROWTH 4 DAYS Performed at [facility], [address], [place], [place] [address]    Report Status PENDING  Incomplete  Blood Culture (routine x 2)     Status: None (Preliminary result)   Collection Time: 04/23/23  4:50 AM   Specimen: BLOOD  Result Value Ref Range Status   Specimen Description BLOOD SITE NOT SPECIFIED  Final   Special Requests   Final    BOTTLES DRAWN AEROBIC AND ANAEROBIC Blood Culture results may not be optimal due to an excessive volume of blood received in culture bottles   Culture   Final    NO GROWTH 4 DAYS Performed at [facility], [address] [place], [place] [address]    Report Status PENDING  Incomplete  Urine Culture     Status: None   Collection Time: 04/23/23  5:43 AM   Specimen: Urine, Random  Result Value Ref Range Status   Specimen Description URINE, RANDOM  Final   Special Requests NONE Reflexed from [id number]  Final   Culture   Final    NO GROWTH Performed at [facility], [address] [place], [place] [address]    Report Status 04/24/2023 FINAL  Final     Radiology Studies: CT ABDOMEN PELVIS W CONTRAST  Result Date: 04/26/2023 CLINICAL DATA:  Sepsis EXAM: CT ABDOMEN AND PELVIS WITH CONTRAST TECHNIQUE: Multidetector CT imaging of the abdomen and pelvis was performed using the standard protocol following bolus administration of intravenous contrast. RADIATION DOSE REDUCTION: This exam was performed according to the departmental dose-optimization program which includes automated exposure control, adjustment of the mA and/or kV according to patient size and/or use of iterative reconstruction technique. CONTRAST:  75mL OMNIPAQUE IOHEXOL 350 MG/ML SOLN  COMPARISON:  CT scan abdomen and pelvis from 03/19/2022 and radiograph right femur from 04/29/2022. FINDINGS: Lower chest: There is trace left pleural effusion. Bilateral lungs are otherwise clear. No right pleural effusion. The heart is normal in size. No pericardial effusion. Hepatobiliary: The liver is normal in size. Non-cirrhotic configuration. No suspicious mass. There is a stable 1.0 x 1.3 cm cyst in the left hepatic lobe, segment 4B. No intrahepatic or extrahepatic bile duct dilation. Gallbladder is surgically absent. Pancreas: Unremarkable. No pancreatic ductal dilatation or surrounding inflammatory changes. Spleen: Within normal limits. No focal lesion. Adrenals/Urinary Tract: Adrenal glands are unremarkable. No suspicious renal mass. No hydronephrosis. No renal or ureteric calculi. Urinary bladder is decompressed secondary to Foley catheter. There are at least 3 tiny diverticula arising from the left lateral wall. Stomach/Bowel: There is a small sliding hiatal hernia. No disproportionate dilation of the small or large bowel loops. No evidence of abnormal bowel wall thickening or inflammatory changes. The appendix is unremarkable. Vascular/Lymphatic: No ascites or pneumoperitoneum. No abdominal or pelvic lymphadenopathy, by size criteria. No aneurysmal dilation of the major abdominal arteries. Reproductive: Normal size prostate. Symmetric seminal vesicles. Other: There is mild anasarca. The soft tissues and abdominal wall are otherwise unremarkable. Musculoskeletal: When compared to the available prior examination from 04/29/2022, there is resorption of the right femoral head and neck and portion of the right proximal femur. There is large collection with mild irregular hyperattenuating wall surrounding the right hip joint. Correlate clinically for septic arthritis. There are mild - moderate multilevel degenerative changes in the visualized spine. Posterior spinal fixation of T9 through L1 noted.  IMPRESSION: 1. When compared to the recent prior examination from 04/29/2022, there is resorption of the right femoral head, neck and portion of the right proximal femur. There is large collection with mild irregular hyperattenuating wall surrounding the right hip joint. Correlate clinically for septic arthritis. 2. Otherwise, no acute inflammatory process identified within the abdomen or pelvis. 3. Multiple other nonacute observations, as described  above. Electronically Signed   By: Jules Schick M.D.   On: 04/26/2023 16:19    Scheduled Meds:  atorvastatin  20 mg Oral Q1200   Chlorhexidine Gluconate Cloth  6 each Topical Q0600   docusate sodium  100 mg Oral BID   enoxaparin (LOVENOX) injection  40 mg Subcutaneous Q24H   famotidine  20 mg Oral BID   feeding supplement (GLUCERNA SHAKE)  237 mL Oral BID BM   insulin aspart  0-15 Units Subcutaneous TID WC   insulin aspart  0-5 Units Subcutaneous QHS   insulin aspart  3 Units Subcutaneous TID WC   irbesartan  300 mg Oral Daily   leptospermum manuka honey  1 Application Topical Daily   multivitamin with minerals  1 tablet Oral Daily   nutrition supplement (JUVEN)  1 packet Oral BID BM   sodium chloride flush  3 mL Intravenous Q12H   Continuous Infusions:  meropenem (MERREM) IV Stopped (04/27/23 0533)   vancomycin       LOS: 4 days   Burnadette Pop, MD [facility] P7/16/2024, 1:43 PM

## 2023-04-27 NOTE — Progress Notes (Signed)
Patient remained on bedrest until 1530 after right hip procedure this am, no complications or bleeding at site, vitals remained normal

## 2023-04-28 DIAGNOSIS — M009 Pyogenic arthritis, unspecified: Secondary | ICD-10-CM

## 2023-04-28 DIAGNOSIS — M86159 Other acute osteomyelitis, unspecified femur: Secondary | ICD-10-CM

## 2023-04-28 DIAGNOSIS — M869 Osteomyelitis, unspecified: Secondary | ICD-10-CM

## 2023-04-28 DIAGNOSIS — M00859 Arthritis due to other bacteria, unspecified hip: Secondary | ICD-10-CM

## 2023-04-28 HISTORY — DX: Pyogenic arthritis, unspecified: M00.9

## 2023-04-28 LAB — CULTURE, BLOOD (ROUTINE X 2): Culture: NO GROWTH

## 2023-04-28 LAB — CBC
HCT: 22.1 % — ABNORMAL LOW (ref 39.0–52.0)
Hemoglobin: 7.1 g/dL — ABNORMAL LOW (ref 13.0–17.0)
MCH: 27.7 pg (ref 26.0–34.0)
MCHC: 32.1 g/dL (ref 30.0–36.0)
MCV: 86.3 fL (ref 80.0–100.0)
Platelets: 407 10*3/uL — ABNORMAL HIGH (ref 150–400)
RBC: 2.56 MIL/uL — ABNORMAL LOW (ref 4.22–5.81)
RDW: 14.2 % (ref 11.5–15.5)
WBC: 13.8 10*3/uL — ABNORMAL HIGH (ref 4.0–10.5)
nRBC: 0 % (ref 0.0–0.2)

## 2023-04-28 LAB — BASIC METABOLIC PANEL
Anion gap: 6 (ref 5–15)
BUN: 26 mg/dL — ABNORMAL HIGH (ref 6–20)
CO2: 19 mmol/L — ABNORMAL LOW (ref 22–32)
Calcium: 7.7 mg/dL — ABNORMAL LOW (ref 8.9–10.3)
Chloride: 102 mmol/L (ref 98–111)
Creatinine, Ser: 0.75 mg/dL (ref 0.61–1.24)
GFR, Estimated: >60 mL/min (ref >60)
Glucose, Bld: 252 mg/dL — ABNORMAL HIGH (ref 70–99)
Potassium: 4 mmol/L (ref 3.5–5.1)
Sodium: 127 mmol/L — ABNORMAL LOW (ref 135–145)

## 2023-04-28 LAB — GLUCOSE, CAPILLARY
Glucose-Capillary: 210 mg/dL — ABNORMAL HIGH (ref 70–99)
Glucose-Capillary: 179 mg/dL — ABNORMAL HIGH (ref 70–99)
Glucose-Capillary: 185 mg/dL — ABNORMAL HIGH (ref 70–99)
Glucose-Capillary: 224 mg/dL — ABNORMAL HIGH (ref 70–99)

## 2023-04-28 MED ORDER — SODIUM CHLORIDE 0.9 % IV SOLN
2.0000 g | Freq: Three times a day (TID) | INTRAVENOUS | Status: DC
  Administered 2023-04-28 – 2023-04-29 (×3): 2 g via INTRAVENOUS
  Filled 2023-04-28 (×5): qty 40

## 2023-04-28 NOTE — Progress Notes (Signed)
PROGRESS NOTE  John Meza ZOX:[id number] DOB: 06-20-1961 DOA: 04/23/2023 PCP: Storm Frisk, MD  Brief History:   Patient is a 62 year old male with history of paraplegia, neurogenic bladder requiring self cath, recurrent ESBL UTI, diabetes type 2, hypertension, chronic right buttock pressure ulcer who presented with fever.  He was recently hospitalized here in June for complicated UTI due to Klebsiella pneumonia, Morganella morganii and was discharged on Cipro.  Report of fever with nausea and vomiting.  On presentation, lab work showed WBC count of 26,000, lactate of 3.  UA suspicious for pyuria.  Started on Merem.  Continued to be febrile intermittently.  Urine culture has not shown any growth.  CT abdomen/pelvis showed large collection around the right hip.  IR consulted for drain.  Orthopedics also consulted   Assessment/Plan: Sepsis/septic arthritis Right hip  -Presented with leukocytosis, fever, tachycardia, dyspnea.  Elevated lactate.  Blood cultures not showing any growth.  Suspected UTI but urine culture did not show any growth.  Started on [place] .Procalcitonin found to be elevated.  Continued to spike fever intermittently.. Chest x-ray did not show pneumonia. >>CT abdomen/pelvis showed large collection on the right hip.  Right hip area also found to be edematous.  Patient is paraplegic and does not have any sensation show did not complain of any pain. Orthopedics consulted>>appreciate Dr. Lajoyce Corners -aspirate culture = GNR -ID consulted>>continue merrem, d/c vanco -Dr. Duda>>hip disarticulation--planned 7/19   Recurrent complicated UTI: History of ESBL E. coli UTI in February 2021, treated for Klebsiella/Morganella UTI a month ago.  Patient has neurogenic bladder, requires self cath.   Currently on Foley cath.  I think discharging him with Foley catheter is a good idea.  I discussed this with the patient and his wife.  Intermittent self cath has more risk for  infection.   -7/12 Urine culture did not show any growth   Nausea/vomiting: Was on ciprofloxacin on discharge from here which could have contributed.  Continue antiemetics.  -Resolved--tolerating diet   Diabetes type 2: -04/12/23 A1c of 7.  On Glucophage, Januvia at home.   -Currently on sliding scale   Hypertension: Takes valsartan, hydrochlorothiazide at home.  -HCTZ discontinued for hyponatremia -continue ARB   Hyperlipidemia: On Lipitor   Paraplegia: On wheelchair-bound.  Has neurogenic bladder.     Chronic right buttock pressure ulcer: Wound care following.  Currently does not appear infected.   Hyponatremia: Looks like this is a chronic problem: On reviewing his previous lab works, he is sodium has been in the range of 120s.  Discontinue hydrochlorothiazide.  Monitor.  Can consider adding salt tablets if sodium level does not improve   Normocytic  anemia: His baseline hemoglobin around 9-10.  Hemoglobin currently in the range of 7-8.  Denies any hematochezia or melena.   -No evidence of acute blood loss.   -am CBC -03/17/23 RBC folate 891 (normal) -check iron studies in am -B12 -FOBT     Family Communication:   daughter and spouse updated 7/17  Consultants:  Ortho, ID  Code Status:  FULL   DVT Prophylaxis:   [place] Lovenox   Procedures: As Listed in Progress Note Above  Antibiotics: None  RN Pressure Injury Documentation: Pressure Injury 04/11/23 Buttocks Right Unstageable - Full thickness tissue loss in which the base of the injury is covered by slough (yellow, tan, gray, green or brown) and/or eschar (tan, brown or black) in the wound bed. (Active)  04/11/23 1814  Location: Buttocks  Location Orientation: Right  Staging: Unstageable - Full thickness tissue loss in which the base of the injury is covered by slough (yellow, tan, gray, green or brown) and/or eschar (tan, brown or black) in the wound bed.  Wound Description (Comments):   Present on Admission: Yes   Dressing Type Foam - Lift dressing to assess site every shift 04/28/23 0600        Subjective: Patient denies fevers, chills, headache, chest pain, dyspnea, nausea, vomiting, diarrhea, abdominal pain, dysuria, hematuria, hematochezia, and melena.   Objective: Vitals:   04/27/23 1937 04/28/23 0541 04/28/23 0736 04/28/23 1543  BP: 122/66 (!) 148/71 131/61 (!) 142/65  Pulse: 94  91 (!) 102  Resp: (!) 23  18 20   Temp: 98.2 F (36.8 C)  98.9 F (37.2 C) (!) 101.2 F (38.4 C)  TempSrc: Oral Oral Oral Oral  SpO2: 99%  99% 98%    Intake/Output Summary (Last 24 hours) at 04/28/2023 1748 Last data filed at 04/28/2023 1545 Gross per 24 hour  Intake 483.08 ml  Output 3500 ml  Net -3016.92 ml   Weight change:  Exam:  General:  Pt is alert, follows commands appropriately, not in acute distress HEENT: No icterus, No thrush, No neck mass, [place]/AT Cardiovascular: RRR, S1/S2, no rubs, no gallops Respiratory: CTA bilaterally, no wheezing, no crackles, no rhonchi Abdomen: Soft/+BS, non tender, non distended, no guarding Extremities: No edema, No lymphangitis, No petechiae, No rashes, no synovitis   Data Reviewed: I have personally reviewed following labs and imaging studies Basic Metabolic Panel: Recent Labs  Lab 04/24/23 0214 04/25/23 0144 04/26/23 0257 04/27/23 0008 04/28/23 0005  NA 133* 128* 127* 126* 127*  K 4.1 4.1 3.8 3.9 4.0  CL 98 96* 97* 100 102  CO2 20* 23 23 21* 19*  GLUCOSE 102* 175* 220* 210* 252*  BUN 15 22* 27* 32* 26*  CREATININE 0.84 0.83 0.79 0.70 0.75  CALCIUM 8.3* 8.3* 7.8* 7.6* 7.7*   Liver Function Tests: Recent Labs  Lab 04/23/23 0439  AST 28  ALT 29  ALKPHOS 117  BILITOT 0.6  PROT 6.1*  ALBUMIN 1.9*   No results for input(s): "LIPASE", "AMYLASE" in the last 168 hours. No results for input(s): "AMMONIA" in the last 168 hours. Coagulation Profile: Recent Labs  Lab 04/23/23 0439  INR 1.3*   CBC: Recent Labs  Lab 04/23/23 0439  04/24/23 0214 04/25/23 0144 04/26/23 0257 04/27/23 0008 04/28/23 0005  WBC 23.4* 17.8* 17.7* 13.4* 16.2* 13.8*  NEUTROABS 19.3*  --   --   --   --   --   HGB 8.9* 7.3* 8.7* 7.7* 7.5* 7.1*  HCT 27.4* 21.4* 27.1* 23.4* 23.4* 22.1*  MCV 85.9 83.9 86.3 84.8 86.7 86.3  PLT 463* 323 458* 422* 431* 407*   Cardiac Enzymes: No results for input(s): "CKTOTAL", "CKMB", "CKMBINDEX", "TROPONINI" in the last 168 hours. BNP: Invalid input(s): "POCBNP" CBG: Recent Labs  Lab 04/27/23 1559 04/27/23 2227 04/28/23 0734 04/28/23 1138 04/28/23 1542  GLUCAP 158* 225* 210* 179* 185*   HbA1C: No results for input(s): "HGBA1C" in the last 72 hours. Urine analysis:    Component Value Date/Time   COLORURINE YELLOW 04/23/2023 0543   APPEARANCEUR HAZY (A) 04/23/2023 0543   APPEARANCEUR Clear 03/19/2022 1413   LABSPEC 1.014 04/23/2023 0543   PHURINE 5.0 04/23/2023 0543   GLUCOSEU NEGATIVE 04/23/2023 0543   HGBUR NEGATIVE 04/23/2023 0543   BILIRUBINUR NEGATIVE 04/23/2023 0543   BILIRUBINUR negative 04/09/2023 0941  BILIRUBINUR Negative 03/19/2022 1413   KETONESUR NEGATIVE 04/23/2023 0543   PROTEINUR >=300 (A) 04/23/2023 0543   UROBILINOGEN 1.0 04/09/2023 0941   UROBILINOGEN 1.0 02/28/2022 1240   NITRITE NEGATIVE 04/23/2023 0543   LEUKOCYTESUR TRACE (A) 04/23/2023 0543   Sepsis Labs: @LABRCNTIP (procalcitonin:4,lacticidven:4) ) Recent Results (from the past 240 hour(s))  Resp panel by RT-PCR (RSV, Flu A&B, Covid) Anterior Nasal Swab     Status: None   Collection Time: 04/23/23  4:37 AM   Specimen: Anterior Nasal Swab  Result Value Ref Range Status   SARS Coronavirus 2 by RT PCR NEGATIVE NEGATIVE Final   Influenza A by PCR NEGATIVE NEGATIVE Final   Influenza B by PCR NEGATIVE NEGATIVE Final    Comment: (NOTE) The Xpert Xpress SARS-CoV-2/FLU/RSV plus assay is intended as an aid in the diagnosis of influenza from Nasopharyngeal swab specimens and should not be used as a sole basis for  treatment. Nasal washings and aspirates are unacceptable for Xpert Xpress SARS-CoV-2/FLU/RSV testing.  Fact Sheet for Patients: BloggerCourse.com  Fact Sheet for Healthcare Providers: SeriousBroker.it  This test is not yet approved or cleared by the [place] FDA and has been authorized for detection and/or diagnosis of SARS-CoV-2 by FDA under an Emergency Use Authorization (EUA). This EUA will remain in effect (meaning this test can be used) for the duration of the COVID-19 declaration under Section 564(b)(1) of the Act, 21 U.S.C. section 360bbb-3(b)(1), unless the authorization is terminated or revoked.     Resp Syncytial Virus by PCR NEGATIVE NEGATIVE Final    Comment: (NOTE) Fact Sheet for Patients: BloggerCourse.com  Fact Sheet for Healthcare Providers: SeriousBroker.it  This test is not yet approved or cleared by the [place] FDA and has been authorized for detection and/or diagnosis of SARS-CoV-2 by FDA under an Emergency Use Authorization (EUA). This EUA will remain in effect (meaning this test can be used) for the duration of the COVID-19 declaration under Section 564(b)(1) of the Act, 21 U.S.C. section 360bbb-3(b)(1), unless the authorization is terminated or revoked.  Performed at [facility], [address], [place], [place] [address]   Blood Culture (routine x 2)     Status: None   Collection Time: 04/23/23  4:39 AM   Specimen: BLOOD LEFT ARM  Result Value Ref Range Status   Specimen Description BLOOD LEFT ARM  Final   Special Requests   Final    BOTTLES DRAWN AEROBIC AND ANAEROBIC Blood Culture results may not be optimal due to an excessive volume of blood received in culture bottles   Culture   Final    NO GROWTH 5 DAYS Performed at [facility], [address], [place], [place] [address]    Report Status 04/28/2023 FINAL  Final   Blood Culture (routine x 2)     Status: None   Collection Time: 04/23/23  4:50 AM   Specimen: BLOOD  Result Value Ref Range Status   Specimen Description BLOOD SITE NOT SPECIFIED  Final   Special Requests   Final    BOTTLES DRAWN AEROBIC AND ANAEROBIC Blood Culture results may not be optimal due to an excessive volume of blood received in culture bottles   Culture   Final    NO GROWTH 5 DAYS Performed at [facility], [address], [place], [place] [address]    Report Status 04/28/2023 FINAL  Final  Urine Culture     Status: None   Collection Time: 04/23/23  5:43 AM  Specimen: Urine, Random  Result Value Ref Range Status   Specimen Description URINE, RANDOM  Final   Special Requests NONE Reflexed from [id number]  Final   Culture   Final    NO GROWTH Performed at [facility], [address] [place], [place] [address]    Report Status 04/24/2023 FINAL  Final  Body fluid culture w Gram Stain     Status: None (Preliminary result)   Collection Time: 04/27/23  2:35 PM   Specimen: Joint, Right Hip; Synovial Fluid  Result Value Ref Range Status   Specimen Description SYNOVIAL  Final   Special Requests RIGHT HIP  Final   Gram Stain   Final    ABUNDANT WBC PRESENT,BOTH PMN AND MONONUCLEAR RARE GRAM NEGATIVE RODS CRITICAL RESULT CALLED TO, READ BACK BY AND VERIFIED WITH: RN Shawnie Dapper ON 04/27/23 @ 1702 BY DRT    Culture   Final    FEW GRAM NEGATIVE RODS CULTURE REINCUBATED FOR BETTER GROWTH Performed at [facility], [address] [place], [place] [address]    Report Status PENDING  Incomplete     Scheduled Meds:  atorvastatin  20 mg Oral Q1200   Chlorhexidine Gluconate Cloth  6 each Topical Q0600   docusate sodium  100 mg Oral BID   enoxaparin (LOVENOX) injection  40 mg Subcutaneous Q24H   famotidine  20 mg Oral BID   feeding supplement (GLUCERNA SHAKE)  237 mL Oral BID BM   insulin aspart  0-15 Units Subcutaneous TID WC   insulin aspart  0-5 Units  Subcutaneous QHS   insulin aspart  3 Units Subcutaneous TID WC   irbesartan  300 mg Oral Daily   leptospermum manuka honey  1 Application Topical Daily   multivitamin with minerals  1 tablet Oral Daily   nutrition supplement (JUVEN)  1 packet Oral BID BM   sodium chloride flush  3 mL Intravenous Q12H   Continuous Infusions:  meropenem (MERREM) IV 2 g (04/28/23 1437)    Procedures/Studies: IR US Guide Bx Asp/Drain  Result Date: 04/27/2023 INDICATION: Enlarging right hip effusion by imaging EXAM: ULTRASOUND ASPIRATION LARGE RIGHT HIP EFFUSION MEDICATIONS: The patient is currently admitted to the hospital and receiving intravenous antibiotics. The antibiotics were administered within an appropriate time frame prior to the initiation of the procedure. ANESTHESIA/SEDATION: None. COMPLICATIONS: None immediate. PROCEDURE: Informed written consent was obtained from the the patient utilizing a Spanish interpreter after a thorough discussion of the procedural risks, benefits and alternatives. All questions were addressed. Maximal Sterile Barrier Technique was utilized including caps, mask, sterile gowns, sterile gloves, sterile drape, hand hygiene and skin antiseptic. A timeout was performed prior to the initiation of the procedure. Previous imaging reviewed. Preliminary ultrasound performed. The large right hip effusion was localized and marked for an anterior approach. Under sterile conditions and local anesthesia, an 18 gauge 10 cm access needle was advanced into the large effusion. Needle position confirmed with ultrasound. Syringe aspiration yielded 25 cc exudative fluid. Sample sent for lab analysis and culture. Needle removed. No immediate complication. Patient tolerated procedure well. IMPRESSION: Successful ultrasound-guided right hip joint aspiration. Electronically Signed   By: Judie Petit.  Shick M.D.   On: 04/27/2023 15:09   CT ABDOMEN PELVIS W CONTRAST  Result Date: 04/26/2023 CLINICAL DATA:  Sepsis  EXAM: CT ABDOMEN AND PELVIS WITH CONTRAST TECHNIQUE: Multidetector CT imaging of the abdomen and pelvis was performed using the standard protocol following bolus administration of intravenous contrast. RADIATION DOSE REDUCTION: This  exam was performed according to the departmental dose-optimization program which includes automated exposure control, adjustment of the mA and/or kV according to patient size and/or use of iterative reconstruction technique. CONTRAST:  75mL OMNIPAQUE IOHEXOL 350 MG/ML SOLN COMPARISON:  CT scan abdomen and pelvis from 03/19/2022 and radiograph right femur from 04/29/2022. FINDINGS: Lower chest: There is trace left pleural effusion. Bilateral lungs are otherwise clear. No right pleural effusion. The heart is normal in size. No pericardial effusion. Hepatobiliary: The liver is normal in size. Non-cirrhotic configuration. No suspicious mass. There is a stable 1.0 x 1.3 cm cyst in the left hepatic lobe, segment 4B. No intrahepatic or extrahepatic bile duct dilation. Gallbladder is surgically absent. Pancreas: Unremarkable. No pancreatic ductal dilatation or surrounding inflammatory changes. Spleen: Within normal limits. No focal lesion. Adrenals/Urinary Tract: Adrenal glands are unremarkable. No suspicious renal mass. No hydronephrosis. No renal or ureteric calculi. Urinary bladder is decompressed secondary to Foley catheter. There are at least 3 tiny diverticula arising from the left lateral wall. Stomach/Bowel: There is a small sliding hiatal hernia. No disproportionate dilation of the small or large bowel loops. No evidence of abnormal bowel wall thickening or inflammatory changes. The appendix is unremarkable. Vascular/Lymphatic: No ascites or pneumoperitoneum. No abdominal or pelvic lymphadenopathy, by size criteria. No aneurysmal dilation of the major abdominal arteries. Reproductive: Normal size prostate. Symmetric seminal vesicles. Other: There is mild anasarca. The soft tissues and  abdominal wall are otherwise unremarkable. Musculoskeletal: When compared to the available prior examination from 04/29/2022, there is resorption of the right femoral head and neck and portion of the right proximal femur. There is large collection with mild irregular hyperattenuating wall surrounding the right hip joint. Correlate clinically for septic arthritis. There are mild - moderate multilevel degenerative changes in the visualized spine. Posterior spinal fixation of T9 through L1 noted. IMPRESSION: 1. When compared to the recent prior examination from 04/29/2022, there is resorption of the right femoral head, neck and portion of the right proximal femur. There is large collection with mild irregular hyperattenuating wall surrounding the right hip joint. Correlate clinically for septic arthritis. 2. Otherwise, no acute inflammatory process identified within the abdomen or pelvis. 3. Multiple other nonacute observations, as described above. Electronically Signed   By: Jules Schick M.D.   On: 04/26/2023 16:19   DG Chest Port 1 View  Result Date: 04/23/2023 CLINICAL DATA:  62 year old male with possible sepsis. EXAM: PORTABLE CHEST 1 VIEW COMPARISON:  Chest x-ray 04/13/2023. FINDINGS: Lung volumes are low. No consolidative airspace disease. No pleural effusions. No pneumothorax. No pulmonary nodule or mass noted. Pulmonary vasculature and the cardiomediastinal silhouette are within normal limits. Orthopedic fixation hardware in the lower thoracic and lumbar spine partially imaged. Surgical clips project over the right upper quadrant of the abdomen, likely from prior cholecystectomy. IMPRESSION: 1. Low lung volumes without radiographic evidence of acute cardiopulmonary disease. Electronically Signed   By: Trudie Reed M.D.   On: 04/23/2023 05:24   DG Knee Right Port  Result Date: 04/13/2023 CLINICAL DATA:  Swelling EXAM: PORTABLE RIGHT KNEE - 4 VIEW COMPARISON:  X-ray 05/23/2022 FINDINGS: Severe  osteopenia. No acute fracture or dislocation. Overall preserved joint spaces. Tiny osteophyte formation. No joint effusion. IMPRESSION: Osteopenia with slight degenerative change. Electronically Signed   By: Karen Kays M.D.   On: 04/13/2023 18:15   DG CHEST PORT 1 VIEW  Result Date: 04/13/2023 CLINICAL DATA:  Shortness of breath EXAM: PORTABLE CHEST 1 VIEW COMPARISON:  04/11/2023 FINDINGS: Underinflation. No consolidation,  pneumothorax or effusion. No edema. Normal cardiopericardial silhouette. Overlapping cardiac leads. Fixation hardware along the thoracolumbar spine. Old left-sided rib deformities. IMPRESSION: Underinflation.  No acute cardiopulmonary disease.  Chronic changes. Electronically Signed   By: Karen Kays M.D.   On: 04/13/2023 17:47   DG Chest Port 1 View  Result Date: 04/11/2023 CLINICAL DATA:  Cough EXAM: PORTABLE CHEST 1 VIEW COMPARISON:  Chest x-ray April 25, 2022 FINDINGS: The cardiomediastinal silhouette is unchanged in contour. No focal pulmonary opacity. No pleural effusion or pneumothorax. The visualized upper abdomen is unremarkable. Partially visualized thoracic fusion hardware. No acute osseous abnormality. IMPRESSION: No acute cardiopulmonary abnormality. Electronically Signed   By: Jacob Moores M.D.   On: 04/11/2023 14:35    Catarina Hartshorn, DO  [facility]  If 7PM-7AM, please contact night-coverage [email or web address] Password [facility] 04/28/2023, 5:48 PM   LOS: 5 days

## 2023-04-28 NOTE — Progress Notes (Signed)
[facility]   Reason for visit: Follow up on fever  Interval History: hip aspiration c/w septic arthritis and bones with osteomyelitis.  Followed by Dr. Lajoyce Corners now.  Culture growing GNRs and remains on meropenem.  Day 7 antibiotics   Physical Exam: Constitutional:  Vitals:   04/28/23 0541 04/28/23 0736  BP: (!) 148/71 131/61  Pulse:  91  Resp:  18  Temp:  98.9 F (37.2 C)  SpO2:  99%  He is in nad Respiratory: normal respiratory effort MS: right leg with edema  Review of Systems: Constitutional: negative for fevers and chills  Lab Results  Component Value Date   WBC 13.8 (H) 04/28/2023   HGB 7.1 (L) 04/28/2023   HCT 22.1 (L) 04/28/2023   MCV 86.3 04/28/2023   PLT 407 (H) 04/28/2023    Lab Results  Component Value Date   CREATININE 0.75 04/28/2023   BUN 26 (H) 04/28/2023   NA 127 (L) 04/28/2023   K 4.0 04/28/2023   CL 102 04/28/2023   CO2 19 (L) 04/28/2023    Lab Results  Component Value Date   ALT 29 04/23/2023   AST 28 04/23/2023   ALKPHOS 117 04/23/2023     Microbiology: Recent Results (from the past 240 hour(s))  Resp panel by RT-PCR (RSV, Flu A&B, Covid) Anterior Nasal Swab     Status: None   Collection Time: 04/23/23  4:37 AM   Specimen: Anterior Nasal Swab  Result Value Ref Range Status   SARS Coronavirus 2 by RT PCR NEGATIVE NEGATIVE Final   Influenza A by PCR NEGATIVE NEGATIVE Final   Influenza B by PCR NEGATIVE NEGATIVE Final    Comment: (NOTE) The Xpert Xpress SARS-CoV-2/FLU/RSV plus assay is intended as an aid in the diagnosis of influenza from Nasopharyngeal swab specimens and should not be used as a sole basis for treatment. Nasal washings and aspirates are unacceptable for Xpert Xpress SARS-CoV-2/FLU/RSV testing.  Fact Sheet for Patients: BloggerCourse.com  Fact Sheet for Healthcare Providers: SeriousBroker.it  This test is not yet approved or cleared by  the [place] FDA and has been authorized for detection and/or diagnosis of SARS-CoV-2 by FDA under an Emergency Use Authorization (EUA). This EUA will remain in effect (meaning this test can be used) for the duration of the COVID-19 declaration under Section 564(b)(1) of the Act, 21 U.S.C. section 360bbb-3(b)(1), unless the authorization is terminated or revoked.     Resp Syncytial Virus by PCR NEGATIVE NEGATIVE Final    Comment: (NOTE) Fact Sheet for Patients: BloggerCourse.com  Fact Sheet for Healthcare Providers: SeriousBroker.it  This test is not yet approved or cleared by the [place] FDA and has been authorized for detection and/or diagnosis of SARS-CoV-2 by FDA under an Emergency Use Authorization (EUA). This EUA will remain in effect (meaning this test can be used) for the duration of the COVID-19 declaration under Section 564(b)(1) of the Act, 21 U.S.C. section 360bbb-3(b)(1), unless the authorization is terminated or revoked.  Performed at [facility], [address] [place], [place] [address]   Blood Culture (routine x 2)     Status: None   Collection Time: 04/23/23  4:39 AM   Specimen: BLOOD LEFT ARM  Result Value Ref Range Status   Specimen Description BLOOD LEFT ARM  Final   Special Requests   Final    BOTTLES DRAWN AEROBIC AND ANAEROBIC Blood Culture results may not be optimal due to an excessive volume of blood received  in culture bottles   Culture   Final    NO GROWTH 5 DAYS Performed at [facility], [address], [place], [place] [address]    Report Status 04/28/2023 FINAL  Final  Blood Culture (routine x 2)     Status: None   Collection Time: 04/23/23  4:50 AM   Specimen: BLOOD  Result Value Ref Range Status   Specimen Description BLOOD SITE NOT SPECIFIED  Final   Special Requests   Final    BOTTLES DRAWN AEROBIC AND ANAEROBIC Blood Culture results may not be optimal due to an  excessive volume of blood received in culture bottles   Culture   Final    NO GROWTH 5 DAYS Performed at [facility], [address] [place], [place] [address]    Report Status 04/28/2023 FINAL  Final  Urine Culture     Status: None   Collection Time: 04/23/23  5:43 AM   Specimen: Urine, Random  Result Value Ref Range Status   Specimen Description URINE, RANDOM  Final   Special Requests NONE Reflexed from [id number]  Final   Culture   Final    NO GROWTH Performed at [facility], [address], [place], [place] [address]    Report Status 04/24/2023 FINAL  Final  Body fluid culture w Gram Stain     Status: None (Preliminary result)   Collection Time: 04/27/23  2:35 PM   Specimen: Joint, Right Hip; Synovial Fluid  Result Value Ref Range Status   Specimen Description SYNOVIAL  Final   Special Requests RIGHT HIP  Final   Gram Stain   Final    ABUNDANT WBC PRESENT,BOTH PMN AND MONONUCLEAR RARE GRAM NEGATIVE RODS CRITICAL RESULT CALLED TO, READ BACK BY AND VERIFIED WITH: RN Shawnie Dapper ON 04/27/23 @ 1702 BY DRT    Culture   Final    FEW GRAM NEGATIVE RODS CULTURE REINCUBATED FOR BETTER GROWTH Performed at [facility], [address], [place], [place] [address]    Report Status PENDING  Incomplete    Impression/Plan:  1. Septic arthritis with osteomyelitis - aspiration with over 400k WBCs and culture with GNR.  On meropenem and has been seen by Dr. Lajoyce Corners.   Continue with meropenem.   Have stopped vancomycin  2.  Leukocytosis - stable at 13.8.  from #1.  Will continue to monitor.  3.  Fever - afebrile > 48 hours. I suspect he will continue to spike fevers with the ongoing infection pending source control.   Will continue to monitor.

## 2023-04-28 NOTE — Plan of Care (Signed)

## 2023-04-28 NOTE — Consult Note (Signed)
ORTHOPAEDIC CONSULTATION  REQUESTING PHYSICIAN: Catarina Hartshorn, MD  Chief Complaint: Right hip  swelling and cellulitis.    HPI: Nettie Cromwell is a 62 y.o. male who presents with  paraplegic with multiple hospitalizations for urinary tract infection.  Patient sustained a proximal femur fracture of the right about a year ago.  With subsequent urinary tract infections it appears that the proximal femur head and neck has become infected.  CT scan shows extensive destruction of the proximal femur and neck.  Past Medical History:  Diagnosis Date   Cellulitis and abscess of buttock 10/2016   Diabetes mellitus    ESBL (extended spectrum beta-lactamase) producing bacteria infection 03/16/2022   Hyperkalemia 03/16/2022   Hypertension    Myositis 04/21/2022   Paraplegia (HCC) 2013   fell from ladder   Pressure ulcer, buttock, right, unstageable (HCC) 12/30/2020   Pyelonephritis due to Escherichia coli 03/16/2022   Sacral decubitus ulcer, stage IV (HCC) 11/03/2021   SCI (spinal cord injury)    Sepsis due to Escherichia coli (E. coli) (HCC) 03/01/2022   Spine fracture 11/16/2011   T 11- T9-L1   Past Surgical History:  Procedure Laterality Date   CHOLECYSTECTOMY N/A 05/01/2015   Procedure: LAPAROSCOPIC CHOLECYSTECTOMY WITH ATTEMPTED INTRAOPERATIVE CHOLANGIOGRAM;  Surgeon: Violeta Gelinas, MD;  Location: [facility] OR;  Service: General;  Laterality: N/A;   IR US GUIDE BX ASP/DRAIN  04/27/2023   SPINE SURGERY     TEE WITHOUT CARDIOVERSION N/A 03/26/2022   Procedure: TRANSESOPHAGEAL ECHOCARDIOGRAM (TEE);  Surgeon: Elease Hashimoto, Deloris Ping, MD;  Location: [facility] ENDOSCOPY;  Service: Cardiovascular;  Laterality: N/A;   Social History   Socioeconomic History   Marital status: Married    Spouse name: Not on file   Number of children: Not on file   Years of education: Not on file   Highest education level: Not on file  Occupational History   Occupation: disabled  Tobacco Use   Smoking status: Former    Current  packs/day: 0.00    Average packs/day: 1 pack/day for 5.0 years (5.0 ttl pk-yrs)    Types: Cigarettes    Start date: 10/12/2006    Quit date: 10/13/2011    Years since quitting: 11.5   Smokeless tobacco: Never   Tobacco comments:    03-24-19 per pt he stopped 1 mo ago   Vaping Use   Vaping status: Never Used  Substance and Sexual Activity   Alcohol use: Not Currently    Alcohol/week: 1.0 standard drink of alcohol    Types: 1 Cans of beer per week   Drug use: Never   Sexual activity: Not on file  Other Topics Concern   Not on file  Social History Narrative   Not on file   Social Determinants of Health   Financial Resource Strain: Not on file  Food Insecurity: No Food Insecurity (04/14/2023)   Hunger Vital Sign    Worried About Running Out of Food in the Last Year: Never true    Ran Out of Food in the Last Year: Never true  Transportation Needs: No Transportation Needs (04/14/2023)   PRAPARE - Administrator, Civil Service (Medical): No    Lack of Transportation (Non-Medical): No  Physical Activity: Not on file  Stress: Not on file  Social Connections: Unknown (02/22/2022)   Received from [facility]   Social Network    Social Network: Not on file   Family History  Problem Relation Age of Onset   Healthy Mother  Diabetes Father    Diabetes Sister    - negative except otherwise stated in the family history section Allergies  Allergen Reactions   Macrobid [Nitrofurantoin] Itching and Rash   Prior to Admission medications   Medication Sig Start Date End Date Taking? Authorizing Provider  atorvastatin (LIPITOR) 20 MG tablet Take 1 tablet (20 mg total) by mouth daily at 12 noon. 12/01/22  Yes Storm Frisk, MD  famotidine (PEPCID) 20 MG tablet Take 1 tablet (20 mg total) by mouth 2 (two) times daily. 12/01/22  Yes Storm Frisk, MD  ferrous sulfate 325 (65 FE) MG tablet Take 1 tablet (325 mg total) by mouth daily. 12/01/22  Yes Storm Frisk, MD   metFORMIN (GLUCOPHAGE) 1000 MG tablet Take 1 tablet (1,000 mg total) by mouth 2 (two) times daily with a meal. 12/01/22  Yes Storm Frisk, MD  sitaGLIPtin (JANUVIA) 100 MG tablet Take 1 tablet (100 mg total) by mouth daily. 12/01/22  Yes Storm Frisk, MD  valsartan-hydrochlorothiazide (DIOVAN-HCT) 320-25 MG tablet Take 1 tablet by mouth daily. 12/01/22  Yes Storm Frisk, MD  Vitamin D, Ergocalciferol, (DRISDOL) 1.25 MG (50000 UNIT) CAPS capsule Take 1 capsule (50,000 Units total) by mouth every 7 (seven) days. Please get updated vit D level. 03/03/23   Storm Frisk, MD  loratadine (CLARITIN) 10 MG tablet Take 1 tablet (10 mg total) by mouth daily. As needed for itchy throat/allergy symptoms Patient not taking: Reported on 03/24/2019 12/26/18 07/05/20  Cain Saupe, MD   IR US Guide Bx Asp/Drain  Result Date: 04/27/2023 INDICATION: Enlarging right hip effusion by imaging EXAM: ULTRASOUND ASPIRATION LARGE RIGHT HIP EFFUSION MEDICATIONS: The patient is currently admitted to the hospital and receiving intravenous antibiotics. The antibiotics were administered within an appropriate time frame prior to the initiation of the procedure. ANESTHESIA/SEDATION: None. COMPLICATIONS: None immediate. PROCEDURE: Informed written consent was obtained from the the patient utilizing a Spanish interpreter after a thorough discussion of the procedural risks, benefits and alternatives. All questions were addressed. Maximal Sterile Barrier Technique was utilized including caps, mask, sterile gowns, sterile gloves, sterile drape, hand hygiene and skin antiseptic. A timeout was performed prior to the initiation of the procedure. Previous imaging reviewed. Preliminary ultrasound performed. The large right hip effusion was localized and marked for an anterior approach. Under sterile conditions and local anesthesia, an 18 gauge 10 cm access needle was advanced into the large effusion. Needle position confirmed with  ultrasound. Syringe aspiration yielded 25 cc exudative fluid. Sample sent for lab analysis and culture. Needle removed. No immediate complication. Patient tolerated procedure well. IMPRESSION: Successful ultrasound-guided right hip joint aspiration. Electronically Signed   By: Judie Petit.  Shick M.D.   On: 04/27/2023 15:09   - pertinent xrays, CT, MRI studies were reviewed and independently interpreted  Positive ROS: All other systems have been reviewed and were otherwise negative with the exception of those mentioned in the HPI and as above.  Physical Exam: General: Alert, no acute distress Psychiatric: Patient is competent for consent with normal mood and affect Lymphatic: No axillary or cervical lymphadenopathy Cardiovascular: No pedal edema Respiratory: No cyanosis, no use of accessory musculature GI: No organomegaly, abdomen is soft and non-tender    Images:  @ENCIMAGES @  Labs:  Lab Results  Component Value Date   HGBA1C 7.0 (H) 04/12/2023   HGBA1C 6.5 (H) 10/28/2022   HGBA1C 5.8 (H) 07/23/2022   ESRSEDRATE 60 (H) 04/25/2023   ESRSEDRATE 125 (H) 03/16/2022   ESRSEDRATE 80 (  H) 11/03/2016   CRP 19.5 (H) 04/25/2023   CRP 229.2 (H) 03/16/2022   REPTSTATUS PENDING 04/27/2023   GRAMSTAIN  04/27/2023    ABUNDANT WBC PRESENT,BOTH PMN AND MONONUCLEAR RARE GRAM NEGATIVE RODS CRITICAL RESULT CALLED TO, READ BACK BY AND VERIFIED WITH: RN Shawnie Dapper ON 04/27/23 @ 1702 BY DRT    CULT  04/27/2023    FEW GRAM NEGATIVE RODS CULTURE REINCUBATED FOR BETTER GROWTH Performed at [facility], [address], [place], [place] [address]    LABORGA KLEBSIELLA PNEUMONIAE (A) 04/09/2023   LABORGA MORGANELLA MORGANII (A) 04/09/2023    Lab Results  Component Value Date   ALBUMIN 1.9 (L) 04/23/2023   ALBUMIN 1.6 (L) 04/15/2023   ALBUMIN 1.7 (L) 04/14/2023        Latest Ref Rng & Units 04/28/2023   12:05 AM 04/27/2023   12:08 AM 04/26/2023    2:57 AM  CBC EXTENDED  WBC 4.0 - 10.5 K/uL  13.8  16.2  13.4   RBC 4.22 - 5.81 MIL/uL 2.56  2.70  2.76   Hemoglobin 13.0 - 17.0 g/dL 7.1  7.5  7.7   HCT 60.4 - 52.0 % 22.1  23.4  23.4   Platelets 150 - 400 K/uL 407  431  422     Neurologic: Patient does not have protective sensation bilateral lower extremities.   MUSCULOSKELETAL:   Skin: Examination there is cellulitis and induration around the right hip.  Review of the CT scan shows extensive destruction of the proximal femur that was not present 1 year ago after the acute proximal femur fracture.  Patient has had subsequent multiple urinary tract infections and hospitalizations.  White cell count 13.8 with a hemoglobin of 7.1.  Albumin 1.9.  Aspiration from the right hip is showing gram-negative rods which seems consistent with his urinary tract infections.  Sed rate 60 with a C-reactive protein 19.5.  Assessment: Paraplegia with multiple urinary tract infections with a septic right hip.  Plan: Recommended proceeding with a hip disarticulation.  Patient's wife was present family member on the phone and the interpreter was used.  All questions were encouraged and answered.  Discussed with the amputation surgery there is still will be issues with wound healing that may require additional surgery.  Plan for deep tissue cultures at time of surgery.  Thank you for the consult and the opportunity to see Mr. Denys Salinger, MD [facility] [phone] 5:20 PM

## 2023-04-28 NOTE — Progress Notes (Signed)
Patient ID: John Meza, male   DOB: March 25, 1961, 62 y.o.   MRN: [id number] I have reviewed patient's CT scan that shows progressive chronic osteomyelitis of the right hip with destruction of the femoral head and neck.  There is no open wounds or draining sinus tract there is swelling over the hip.  I will return to discuss with patient treatment options.  I will need an interpreter.  No surgical intervention planned at this time.

## 2023-04-29 ENCOUNTER — Other Ambulatory Visit: Payer: Self-pay

## 2023-04-29 LAB — CBC
HCT: 22.6 % — ABNORMAL LOW (ref 39.0–52.0)
Hemoglobin: 7.2 g/dL — ABNORMAL LOW (ref 13.0–17.0)
MCH: 27.4 pg (ref 26.0–34.0)
MCHC: 31.9 g/dL (ref 30.0–36.0)
MCV: 85.9 fL (ref 80.0–100.0)
Platelets: 428 10*3/uL — ABNORMAL HIGH (ref 150–400)
RBC: 2.63 MIL/uL — ABNORMAL LOW (ref 4.22–5.81)
RDW: 14.2 % (ref 11.5–15.5)
WBC: 12.1 10*3/uL — ABNORMAL HIGH (ref 4.0–10.5)
nRBC: 0 % (ref 0.0–0.2)

## 2023-04-29 LAB — GLUCOSE, CAPILLARY
Glucose-Capillary: 209 mg/dL — ABNORMAL HIGH (ref 70–99)
Glucose-Capillary: 189 mg/dL — ABNORMAL HIGH (ref 70–99)
Glucose-Capillary: 229 mg/dL — ABNORMAL HIGH (ref 70–99)
Glucose-Capillary: 217 mg/dL — ABNORMAL HIGH (ref 70–99)

## 2023-04-29 LAB — OSMOLALITY: Osmolality: 289 mOsm/kg (ref 275–295)

## 2023-04-29 LAB — IRON AND TIBC: Iron: 15 ug/dL — ABNORMAL LOW (ref 45–182)

## 2023-04-29 LAB — BASIC METABOLIC PANEL
Anion gap: 8 (ref 5–15)
BUN: 31 mg/dL — ABNORMAL HIGH (ref 6–20)
CO2: 19 mmol/L — ABNORMAL LOW (ref 22–32)
Calcium: 8 mg/dL — ABNORMAL LOW (ref 8.9–10.3)
Chloride: 101 mmol/L (ref 98–111)
Creatinine, Ser: 0.89 mg/dL (ref 0.61–1.24)
GFR, Estimated: >60 mL/min (ref >60)
Glucose, Bld: 215 mg/dL — ABNORMAL HIGH (ref 70–99)
Potassium: 3.8 mmol/L (ref 3.5–5.1)
Sodium: 128 mmol/L — ABNORMAL LOW (ref 135–145)

## 2023-04-29 LAB — OCCULT BLOOD X 1 CARD TO LAB, STOOL: Fecal Occult Bld: NEGATIVE

## 2023-04-29 LAB — SURGICAL PCR SCREEN
MRSA, PCR: NEGATIVE
Staphylococcus aureus: NEGATIVE

## 2023-04-29 LAB — MAGNESIUM: Magnesium: 1.4 mg/dL — ABNORMAL LOW (ref 1.7–2.4)

## 2023-04-29 LAB — SODIUM, URINE, RANDOM: Sodium, Ur: 84 mmol/L

## 2023-04-29 LAB — FERRITIN: Ferritin: 483 ng/mL — ABNORMAL HIGH (ref 24–336)

## 2023-04-29 LAB — VITAMIN B12: Vitamin B-12: 698 pg/mL (ref 180–914)

## 2023-04-29 MED ORDER — POVIDONE-IODINE 10 % EX SWAB: 2.0000 | Freq: Once | CUTANEOUS | Status: DC

## 2023-04-29 MED ORDER — CEFAZOLIN SODIUM-DEXTROSE 2-4 GM/100ML-% IV SOLN: 2.0000 g | INTRAVENOUS | Status: DC

## 2023-04-29 MED ORDER — FERROUS SULFATE 325 (65 FE) MG PO TABS
325.0000 mg | ORAL_TABLET | ORAL | Status: DC
  Administered 2023-04-29 – 2023-05-05 (×4): 325 mg via ORAL
  Filled 2023-04-29 (×4): qty 1

## 2023-04-29 MED ORDER — SODIUM CHLORIDE 0.9 % IV SOLN
1.0000 g | Freq: Three times a day (TID) | INTRAVENOUS | Status: DC
  Administered 2023-04-29 – 2023-05-05 (×18): 1 g via INTRAVENOUS
  Filled 2023-04-29 (×22): qty 20

## 2023-04-29 MED ORDER — INSULIN GLARGINE-YFGN 100 UNIT/ML ~~LOC~~ SOLN
12.0000 [IU] | Freq: Every day | SUBCUTANEOUS | Status: DC
  Administered 2023-04-29 – 2023-05-05 (×6): 12 [IU] via SUBCUTANEOUS
  Filled 2023-04-29 (×7): qty 0.12

## 2023-04-29 MED ORDER — CHLORHEXIDINE GLUCONATE 4 % EX SOLN
60.0000 mL | Freq: Once | CUTANEOUS | Status: AC
  Administered 2023-04-30: 4 via TOPICAL
  Filled 2023-04-29: qty 60

## 2023-04-29 NOTE — Plan of Care (Signed)

## 2023-04-29 NOTE — Inpatient Diabetes Management (Addendum)
Inpatient Diabetes Program Recommendations  AACE/ADA: New Consensus Statement on Inpatient Glycemic Control (2015)  Target Ranges:  Prepandial:   less than 140 mg/dL      Peak postprandial:   less than 180 mg/dL (1-2 hours)      Critically ill patients:  140 - 180 mg/dL   Lab Results  Component Value Date   GLUCAP 189 (H) 04/29/2023   HGBA1C 7.0 (H) 04/12/2023    Review of Glycemic Control  Latest Reference Range & Units 04/28/23 07:34 04/28/23 11:38 04/28/23 15:42 04/28/23 21:12 04/29/23 07:48 04/29/23 11:47  Glucose-Capillary 70 - 99 mg/dL 956 (H) 213 (H) 086 (H) 224 (H) 209 (H) 189 (H)   Diabetes history: DM 2 Outpatient Diabetes medications:  Metformin 1000 mg bid, Januvia 100 mg daily Current orders for Inpatient glycemic control:  Novolog 0-15 units daily and HS Novolog 3 units tid with meals  Inpatient Diabetes Program Recommendations:    Consider adding Semglee 12 units daily.    Thanks,  Beryl Meager, RN, BC-ADM Inpatient Diabetes Coordinator Pager [phone]  (8a-5p)

## 2023-04-29 NOTE — Progress Notes (Signed)
PROGRESS NOTE    John Meza  ZOX:[id number] DOB: [birth year] DOA: 04/23/2023 PCP: Storm Frisk, MD  No chief complaint on file.   Brief Narrative:   Patient is John Meza 62 year old male with history of paraplegia, neurogenic bladder requiring self cath, recurrent ESBL UTI, diabetes type 2, hypertension, chronic right buttock pressure ulcer who presented with fever. He was recently hospitalized here in June for complicated UTI due to Klebsiella pneumonia, Morganella morganii and was discharged on Cipro. Report of fever with nausea and vomiting. On presentation, lab work showed WBC count of 26,000, lactate of 3. UA suspicious for pyuria. Started on Merem. Continued to be febrile intermittently. Urine culture has not shown any growth. CT abdomen/pelvis showed large collection around the right hip. IR consulted for drain. Orthopedics also consulted.  Planning hip disarticulation 7/19.   Assessment & Plan:   Principal Problem:   Sepsis (HCC) Active Problems:   Controlled type 2 diabetes mellitus (HCC)   Paraplegia (HCC)   Hyponatremia   Essential hypertension   Complicated UTI (urinary tract infection)   Normocytic anemia   Hyperlipidemia associated with type 2 diabetes mellitus (HCC)   Leukocytosis   Fever of unknown origin   Septic hip (HCC)   Osteomyelitis of right hip (HCC)  Sepsis/septic arthritis Right hip  -Presented with leukocytosis, fever, tachycardia, dyspnea.  Elevated lactate.  Blood cultures not showing any growth.  Suspected UTI but urine culture did not show any growth.  Started on [place] .Procalcitonin found to be elevated.  Continued to spike fever intermittently.. Chest x-ray did not show pneumonia. >>CT abdomen/pelvis showed large collection on the right hip.  Right hip area also found to be edematous.  Patient is paraplegic and does not have any sensation show did not complain of any pain. Orthopedics consulted>>appreciate Dr. Lajoyce Corners -aspirate culture =  ESBL E coli  -ID consulted>>continue merrem, d/c vanco -Dr. Duda>>hip disarticulation--planned 7/19   Recurrent complicated UTI: History of ESBL E. coli UTI in February 2021, treated for Klebsiella/Morganella UTI John Meza month ago.  Patient has neurogenic bladder, requires self cath.   Currently on Foley cath.  Plan from previous hospitalist to potentially discharge with indwelling foley.  -7/12 Urine culture did not show any growth   Nausea/vomiting: Was on ciprofloxacin on discharge from here which could have contributed.  Continue antiemetics.  -Resolved--tolerating diet   Diabetes type 2: -04/12/23 A1c of 7.  On Glucophage, Januvia at home.   -Currently on sliding scale - basal added   Hypertension:  -HCTZ discontinued for hyponatremia -arb on hold as well   Hyperlipidemia: On Lipitor   Paraplegia: On wheelchair-bound.  Has neurogenic bladder.     Chronic right buttock pressure ulcer: Wound care following.     Hyponatremia: Looks like this is John Meza chronic problem: On reviewing his previous lab works, he is sodium has been in the range of 120s.  Discontinue hydrochlorothiazide.  Hold ARB as well.  Monitor.   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 PO iron every other day Maintain active type and screen      DVT prophylaxis: lovenox Code Status: full Family Communication: none Disposition:   Status is: Inpatient Remains inpatient appropriate because: continued need for inpatient care   Consultants:  ID ortho  Procedures:  none  Antimicrobials:  Anti-infectives (From admission, onward)    Start     Dose/Rate Route Frequency Ordered Stop   04/29/23 1400  meropenem (  MERREM) 1 g in sodium chloride 0.9 % 100 mL IVPB        1 g 200 mL/hr over 30 Minutes Intravenous Every 8 hours 04/29/23 0832     04/28/23 1400  meropenem (MERREM) 2 g in sodium chloride 0.9 % 100 mL IVPB  Status:  Discontinued        2 g 280  mL/hr over 30 Minutes Intravenous Every 8 hours 04/28/23 1305 04/29/23 0832   04/27/23 1800  vancomycin (VANCOREADY) IVPB 1500 mg/300 mL  Status:  Discontinued        1,500 mg 150 mL/hr over 120 Minutes Intravenous Every 24 hours 04/26/23 1656 04/28/23 0934   04/26/23 1730  vancomycin (VANCOREADY) IVPB 1750 mg/350 mL        1,750 mg 175 mL/hr over 120 Minutes Intravenous  Once 04/26/23 1656 04/26/23 1922   04/24/23 1100  meropenem (MERREM) 1 g in sodium chloride 0.9 % 100 mL IVPB  Status:  Discontinued        1 g 200 mL/hr over 30 Minutes Intravenous Every 8 hours 04/24/23 0953 04/28/23 1305   04/23/23 0445  ceFEPIme (MAXIPIME) 2 g in sodium chloride 0.9 % 100 mL IVPB  Status:  Discontinued        2 g 200 mL/hr over 30 Minutes Intravenous  Once 04/23/23 0438 04/23/23 0443   04/23/23 0445  meropenem (MERREM) 1 g in sodium chloride 0.9 % 100 mL IVPB        1 g 200 mL/hr over 30 Minutes Intravenous  Once 04/23/23 0443 04/23/23 0550       Subjective: Spanish interpreter used No complaints  Objective: Vitals:   04/29/23 0445 04/29/23 0749 04/29/23 1148 04/29/23 1610  BP: 127/67 137/85 125/67 124/72  Pulse: 94 (!) 102 (!) 104 98  Resp: 19 15 18 18   Temp:  98.9 F (37.2 C) 98.2 F (36.8 C) 98.6 F (37 C)  TempSrc:  Oral Oral Oral  SpO2: 96% 99% 93% 98%    Intake/Output Summary (Last 24 hours) at 04/29/2023 1845 Last data filed at 04/29/2023 1507 Gross per 24 hour  Intake --  Output 1800 ml  Net -1800 ml   There were no vitals filed for this visit.  Examination:  General exam: Appears calm and comfortable  Respiratory system: unlabored Cardiovascular system: RRR Gastrointestinal system: Abdomen is nondistended, soft and nontender.  Extremities: RLE swelling   Data Reviewed: I have personally reviewed following labs and imaging studies  CBC: Recent Labs  Lab 04/23/23 0439 04/24/23 0214 04/25/23 0144 04/26/23 0257 04/27/23 0008 04/28/23 0005 04/29/23 0131  WBC  23.4*   < > 17.7* 13.4* 16.2* 13.8* 12.1*  NEUTROABS 19.3*  --   --   --   --   --   --   HGB 8.9*   < > 8.7* 7.7* 7.5* 7.1* 7.2*  HCT 27.4*   < > 27.1* 23.4* 23.4* 22.1* 22.6*  MCV 85.9   < > 86.3 84.8 86.7 86.3 85.9  PLT 463*   < > 458* 422* 431* 407* 428*   < > = values in this interval not displayed.    Basic Metabolic Panel: Recent Labs  Lab 04/25/23 0144 04/26/23 0257 04/27/23 0008 04/28/23 0005 04/29/23 0131  NA 128* 127* 126* 127* 128*  K 4.1 3.8 3.9 4.0 3.8  CL 96* 97* 100 102 101  CO2 23 23 21* 19* 19*  GLUCOSE 175* 220* 210* 252* 215*  BUN 22* 27* 32* 26* 31*  CREATININE 0.83 0.79 0.70 0.75 0.89  CALCIUM 8.3* 7.8* 7.6* 7.7* 8.0*  MG  --   --   --   --  1.4*    GFR: CrCl cannot be calculated (Unknown ideal weight.).  Liver Function Tests: Recent Labs  Lab 04/23/23 0439  AST 28  ALT 29  ALKPHOS 117  BILITOT 0.6  PROT 6.1*  ALBUMIN 1.9*    CBG: Recent Labs  Lab 04/28/23 1542 04/28/23 2112 04/29/23 0748 04/29/23 1147 04/29/23 1609  GLUCAP 185* 224* 209* 189* 229*     Recent Results (from the past 240 hour(s))  Resp panel by RT-PCR (RSV, Flu John Meza&B, Covid) Anterior Nasal Swab     Status: None   Collection Time: 04/23/23  4:37 AM   Specimen: Anterior Nasal Swab  Result Value Ref Range Status   SARS Coronavirus 2 by RT PCR NEGATIVE NEGATIVE Final   Influenza Leiland Mihelich by PCR NEGATIVE NEGATIVE Final   Influenza B by PCR NEGATIVE NEGATIVE Final    Comment: (NOTE) The Xpert Xpress SARS-CoV-2/FLU/RSV plus assay is intended as an aid in the diagnosis of influenza from Nasopharyngeal swab specimens and should not be used as Tyquavious Gamel sole basis for treatment. Nasal washings and aspirates are unacceptable for Xpert Xpress SARS-CoV-2/FLU/RSV testing.  Fact Sheet for Patients: BloggerCourse.com  Fact Sheet for Healthcare Providers: SeriousBroker.it  This test is not yet approved or cleared by the [place] FDA  and has been authorized for detection and/or diagnosis of SARS-CoV-2 by FDA under an Emergency Use Authorization (EUA). This EUA will remain in effect (meaning this test can be used) for the duration of the COVID-19 declaration under Section 564(b)(1) of the Act, 21 U.S.C. section 360bbb-3(b)(1), unless the authorization is terminated or revoked.     Resp Syncytial Virus by PCR NEGATIVE NEGATIVE Final    Comment: (NOTE) Fact Sheet for Patients: BloggerCourse.com  Fact Sheet for Healthcare Providers: SeriousBroker.it  This test is not yet approved or cleared by the [place] FDA and has been authorized for detection and/or diagnosis of SARS-CoV-2 by FDA under an Emergency Use Authorization (EUA). This EUA will remain in effect (meaning this test can be used) for the duration of the COVID-19 declaration under Section 564(b)(1) of the Act, 21 U.S.C. section 360bbb-3(b)(1), unless the authorization is terminated or revoked.  Performed at [facility], [address] [place], [place] [address]   Blood Culture (routine x 2)     Status: None   Collection Time: 04/23/23  4:39 AM   Specimen: BLOOD LEFT ARM  Result Value Ref Range Status   Specimen Description BLOOD LEFT ARM  Final   Special Requests   Final    BOTTLES DRAWN AEROBIC AND ANAEROBIC Blood Culture results may not be optimal due to an excessive volume of blood received in culture bottles   Culture   Final    NO GROWTH 5 DAYS Performed at [facility], [address] [place], [place] [address]    Report Status 04/28/2023 FINAL  Final  Blood Culture (routine x 2)     Status: None   Collection Time: 04/23/23  4:50 AM   Specimen: BLOOD  Result Value Ref Range Status   Specimen Description BLOOD SITE NOT SPECIFIED  Final   Special Requests   Final    BOTTLES DRAWN AEROBIC AND ANAEROBIC Blood Culture results may not be optimal due to an excessive volume of  blood received in culture bottles   Culture   Final  NO GROWTH 5 DAYS Performed at [facility], [address], [place], [place] [address]    Report Status 04/28/2023 FINAL  Final  Urine Culture     Status: None   Collection Time: 04/23/23  5:43 AM   Specimen: Urine, Random  Result Value Ref Range Status   Specimen Description URINE, RANDOM  Final   Special Requests NONE Reflexed from [id number]  Final   Culture   Final    NO GROWTH Performed at [facility], [address], [place], [place] [address]    Report Status 04/24/2023 FINAL  Final  Body fluid culture w Gram Stain     Status: None   Collection Time: 04/27/23  2:35 PM   Specimen: Joint, Right Hip; Synovial Fluid  Result Value Ref Range Status   Specimen Description SYNOVIAL  Final   Special Requests RIGHT HIP  Final   Gram Stain   Final    ABUNDANT WBC PRESENT,BOTH PMN AND MONONUCLEAR RARE GRAM NEGATIVE RODS CRITICAL RESULT CALLED TO, READ BACK BY AND VERIFIED WITH: RN Shawnie Dapper ON 04/27/23 @ 1702 BY DRT Performed at [facility], [address] [place], [place] [address]    Culture   Final    FEW ESCHERICHIA COLI Confirmed Extended Spectrum Beta-Lactamase Producer (ESBL).  In bloodstream infections from ESBL organisms, carbapenems are preferred over piperacillin/tazobactam. They are shown to have Dimond Crotty lower risk of mortality.    Report Status 04/29/2023 FINAL  Final   Organism ID, Bacteria ESCHERICHIA COLI  Final      Susceptibility   Escherichia coli - MIC*    AMPICILLIN >=32 RESISTANT Resistant     CEFEPIME 1 SENSITIVE Sensitive     CEFTAZIDIME RESISTANT Resistant     CEFTRIAXONE 32 RESISTANT Resistant     CIPROFLOXACIN >=4 RESISTANT Resistant     GENTAMICIN <=1 SENSITIVE Sensitive     IMIPENEM <=0.25 SENSITIVE Sensitive     TRIMETH/SULFA >=320 RESISTANT Resistant     AMPICILLIN/SULBACTAM >=32 RESISTANT Resistant     PIP/TAZO 8 SENSITIVE Sensitive     * FEW ESCHERICHIA COLI          Radiology Studies: No results found.      Scheduled Meds:  atorvastatin  20 mg Oral Q1200   Chlorhexidine Gluconate Cloth  6 each Topical Q0600   docusate sodium  100 mg Oral BID   enoxaparin (LOVENOX) injection  40 mg Subcutaneous Q24H   famotidine  20 mg Oral BID   feeding supplement (GLUCERNA SHAKE)  237 mL Oral BID BM   insulin aspart  0-15 Units Subcutaneous TID WC   insulin aspart  0-5 Units Subcutaneous QHS   insulin aspart  3 Units Subcutaneous TID WC   insulin glargine-yfgn  12 Units Subcutaneous Daily   irbesartan  300 mg Oral Daily   leptospermum manuka honey  1 Application Topical Daily   multivitamin with minerals  1 tablet Oral Daily   nutrition supplement (JUVEN)  1 packet Oral BID BM   sodium chloride flush  3 mL Intravenous Q12H   Continuous Infusions:  meropenem (MERREM) IV 1 g (04/29/23 1515)     LOS: 6 days    Time spent: oer 30 min    Lacretia Nicks, MD [facility]   To contact the attending provider between 7A-7P or the covering provider during after hours 7P-7A, please log into the web site [email or web address] and access using universal [place] password for that web site. If you  do not have the password, please call the hospital operator.  04/29/2023, 6:45 PM

## 2023-04-29 NOTE — TOC Initial Note (Signed)
Transition of Care [facility]) - Initial/Assessment Note    Patient Details  Name: John Meza MRN: [id number] Date of Birth: [birth year]  Transition of Care [facility]) CM/SW Contact:    Lockie Pares, RN Phone Number: 04/29/2023, 5:43 PM  Clinical Narrative:                  One of many admissions for this paraplegic patient that is followed by [facility] and case management there. He has a wheelchair and is being seen for wound care for decubitus. He is on day 6 of admission and will have to have hip surgery for large fluid collection per Dr Lajoyce Corners. He currently self caths at home, but the plan is to leave a foley in. He is followed by ID.   Will need FC to discuss possible Medicaid. Will speak with him wife / tomorrow about  this.   TOC will monitor for needs, recommendations, and transitions of care.   Barriers to Discharge: Continued Medical Work up, Undocumented, Inadequate or no insurance   Patient Goals and CMS Choice            Expected Discharge Plan and Services   Discharge Planning Services: CM Consult   Living arrangements for the past 2 months: Single Family Home                                      Prior Living Arrangements/Services Living arrangements for the past 2 months: Single Family Home Lives with:: Spouse, Adult Children Patient language and need for interpreter reviewed:: Yes (Spanish)        Need for Family Participation in Patient Care: Yes (Comment) Care giver support system in place?: Yes (comment)   Criminal Activity/Legal Involvement Pertinent to Current Situation/Hospitalization: No - Comment as needed  Activities of Daily Living      Permission Sought/Granted                  Emotional Assessment       Orientation: : Oriented to Self, Oriented to Place, Oriented to  Time Alcohol / Substance Use: Not Applicable Psych Involvement: No (comment)  Admission diagnosis:  Sepsis secondary to UTI (HCC) [A41.9,  N39.0] Sepsis, due to unspecified organism, unspecified whether acute organ dysfunction present [facility]) [A41.9] Patient Active Problem List   Diagnosis Date Noted   Septic hip (HCC) 04/28/2023   Osteomyelitis of right hip (HCC) 04/28/2023   Leukocytosis 04/25/2023   Fever of unknown origin 04/25/2023   Sepsis secondary to UTI (HCC) 04/23/2023   Sepsis (HCC) 04/11/2023   History of infection due to extended spectrum beta lactamase (ESBL) producing bacteria 04/11/2023   AKI (acute kidney injury) (HCC) 04/11/2023   Hyperlipidemia associated with type 2 diabetes mellitus (HCC) 12/01/2022   Dental caries 12/01/2022   Iron deficiency anemia due to chronic blood loss 11/14/2022   Anemia of chronic disease 11/14/2022   Diarrhea 06/23/2022   Medication monitoring encounter 04/21/2022   Normocytic anemia 03/03/2022   Complicated UTI (urinary tract infection) 02/28/2022   Wheelchair dependence 12/23/2020   History of cholecystectomy 11/18/2020   Hyponatremia    Essential hypertension    Hypokalemia    Neurogenic bladder 01/06/2012   Neurogenic bowel 01/06/2012   Paraplegia (HCC) 11/16/2011   Controlled type 2 diabetes mellitus (HCC) 05/26/2007   OBESITY, MODERATE 05/26/2007   PCP:  Storm Frisk, MD Pharmacy:   [facility] [address] [place] [address] Phone: [phone] Fax: [phone]  [facility] [address] [place] [address] Phone: [phone] Fax: [phone]     Social Determinants of Health (SDOH) Social History: SDOH Screenings   Food Insecurity: No Food Insecurity (04/14/2023)  Housing: Low Risk  (04/14/2023)  Transportation Needs: No Transportation Needs (04/14/2023)  Utilities: Not At Risk (04/14/2023)  Depression (PHQ2-9): Low Risk  (01/27/2023)  Social Connections: Unknown (02/22/2022)   Received from [facility]  Tobacco Use: Medium Risk (04/23/2023)    SDOH Interventions:     Readmission Risk Interventions     No data to display

## 2023-04-30 LAB — HEMOGLOBIN AND HEMATOCRIT, BLOOD
HCT: 28.8 % — ABNORMAL LOW (ref 39.0–52.0)
Hemoglobin: 9.4 g/dL — ABNORMAL LOW (ref 13.0–17.0)

## 2023-04-30 LAB — CBC WITH DIFFERENTIAL/PLATELET
Abs Immature Granulocytes: 0.12 10*3/uL — ABNORMAL HIGH (ref 0.00–0.07)
Basophils Absolute: 0 10*3/uL (ref 0.0–0.1)
Basophils Relative: 0 %
Eosinophils Absolute: 0 10*3/uL (ref 0.0–0.5)
Eosinophils Relative: 0 %
HCT: 25.1 % — ABNORMAL LOW (ref 39.0–52.0)
Hemoglobin: 8.2 g/dL — ABNORMAL LOW (ref 13.0–17.0)
Immature Granulocytes: 1 %
Lymphocytes Relative: 10 %
Lymphs Abs: 1.5 10*3/uL (ref 0.7–4.0)
MCH: 28.6 pg (ref 26.0–34.0)
MCHC: 32.7 g/dL (ref 30.0–36.0)
MCV: 87.5 fL (ref 80.0–100.0)
Monocytes Absolute: 1.2 10*3/uL — ABNORMAL HIGH (ref 0.1–1.0)
Monocytes Relative: 8 %
Neutro Abs: 11.9 10*3/uL — ABNORMAL HIGH (ref 1.7–7.7)
Neutrophils Relative %: 81 %
Platelets: 457 10*3/uL — ABNORMAL HIGH (ref 150–400)
RBC: 2.87 MIL/uL — ABNORMAL LOW (ref 4.22–5.81)
RDW: 14.4 % (ref 11.5–15.5)
WBC: 14.8 10*3/uL — ABNORMAL HIGH (ref 4.0–10.5)
nRBC: 0 % (ref 0.0–0.2)

## 2023-04-30 LAB — COMPREHENSIVE METABOLIC PANEL
ALT: 58 U/L — ABNORMAL HIGH (ref 0–44)
AST: 43 U/L — ABNORMAL HIGH (ref 15–41)
Albumin: 1.6 g/dL — ABNORMAL LOW (ref 3.5–5.0)
Alkaline Phosphatase: 96 U/L (ref 38–126)
Anion gap: 7 (ref 5–15)
BUN: 26 mg/dL — ABNORMAL HIGH (ref 6–20)
CO2: 22 mmol/L (ref 22–32)
Calcium: 8.4 mg/dL — ABNORMAL LOW (ref 8.9–10.3)
Chloride: 103 mmol/L (ref 98–111)
Creatinine, Ser: 0.79 mg/dL (ref 0.61–1.24)
GFR, Estimated: >60 mL/min (ref >60)
Glucose, Bld: 198 mg/dL — ABNORMAL HIGH (ref 70–99)
Potassium: 3.5 mmol/L (ref 3.5–5.1)
Sodium: 132 mmol/L — ABNORMAL LOW (ref 135–145)
Total Bilirubin: 0.2 mg/dL — ABNORMAL LOW (ref 0.3–1.2)
Total Protein: 5.2 g/dL — ABNORMAL LOW (ref 6.5–8.1)

## 2023-04-30 LAB — BPAM RBC: Blood Product Expiration Date: 202408232359

## 2023-04-30 LAB — OSMOLALITY, URINE: Osmolality, Ur: 479 mOsm/kg (ref 300–900)

## 2023-04-30 LAB — PHOSPHORUS: Phosphorus: 2.2 mg/dL — ABNORMAL LOW (ref 2.5–4.6)

## 2023-04-30 LAB — BODY FLUID CULTURE W GRAM STAIN

## 2023-04-30 LAB — PREPARE RBC (CROSSMATCH)

## 2023-04-30 LAB — GLUCOSE, CAPILLARY
Glucose-Capillary: 227 mg/dL — ABNORMAL HIGH (ref 70–99)
Glucose-Capillary: 253 mg/dL — ABNORMAL HIGH (ref 70–99)
Glucose-Capillary: 163 mg/dL — ABNORMAL HIGH (ref 70–99)
Glucose-Capillary: 180 mg/dL — ABNORMAL HIGH (ref 70–99)

## 2023-04-30 LAB — TYPE AND SCREEN

## 2023-04-30 LAB — MAGNESIUM: Magnesium: 1.5 mg/dL — ABNORMAL LOW (ref 1.7–2.4)

## 2023-04-30 LAB — FOLATE: Folate: 9.5 ng/mL (ref >5.9)

## 2023-04-30 MED ORDER — TRANEXAMIC ACID 1000 MG/10ML IV SOLN
2000.0000 mg | INTRAVENOUS | Status: DC
  Filled 2023-04-30: qty 20

## 2023-04-30 MED ORDER — POVIDONE-IODINE 10 % EX SWAB
2.0000 | Freq: Once | CUTANEOUS | Status: AC
  Administered 2023-05-01: 2 via TOPICAL

## 2023-04-30 MED ORDER — ORAL CARE MOUTH RINSE: 15.0000 mL | OROMUCOSAL | Status: DC | PRN

## 2023-04-30 MED ORDER — SODIUM CHLORIDE 0.9% IV SOLUTION: Freq: Once | INTRAVENOUS | Status: AC

## 2023-04-30 MED ORDER — CHLORHEXIDINE GLUCONATE 4 % EX SOLN
60.0000 mL | Freq: Once | CUTANEOUS | Status: AC
  Administered 2023-05-01: 4 via TOPICAL
  Filled 2023-04-30: qty 60

## 2023-04-30 MED ORDER — TRANEXAMIC ACID-NACL 1000-0.7 MG/100ML-% IV SOLN
1000.0000 mg | INTRAVENOUS | Status: AC
  Administered 2023-05-01: 1000 mg via INTRAVENOUS

## 2023-04-30 MED ORDER — CEFAZOLIN SODIUM-DEXTROSE 2-4 GM/100ML-% IV SOLN: 2.0000 g | INTRAVENOUS | Status: DC

## 2023-04-30 NOTE — Progress Notes (Signed)
Nutrition Follow-up  DOCUMENTATION CODES:      INTERVENTION:  Continue 1 packet Juven BID, each packet provides 95 calories, 2.5 grams of protein (collagen), and 9.8 grams of carbohydrate (3 grams sugar); also contains 7 grams of L-arginine and L-glutamine, 300 mg vitamin C, 15 mg vitamin E, 1.2 mcg vitamin B-12, 9.5 mg zinc, 200 mg calcium, and 1.5 g  Calcium Beta-hydroxy-Beta-methylbutyrate to support wound healing  Continue Glucerna Shake po BID, each supplement provides 220 kcal and 10 grams of protein  Continue Carb modified diet   Continue MVI  Weekly weights   NUTRITION DIAGNOSIS:   Increased nutrient needs related to wound healing as evidenced by other (comment) (unstageable wound). -ongoing  GOAL:   Patient will meet greater than or equal to 90% of their needs -progressing   MONITOR:   PO intake, Labs, I & O's, Weight trends, Supplement acceptance, Skin  REASON FOR ASSESSMENT:   Consult Other (Comment) (nutritional goals)  ASSESSMENT:   62 y.o. 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   Plans for hip disarticulation on 7/20 per MD. Patient to be NPO at [place]   Tolerating diet without any N/V  Patient needs accurate weight trends   Labs:  CBG 253, Na 132, BUN 26, phos 2.2, mag 1.5, AST 43, ALT 58, iron 15  Meds: ancef, lipitor, colace, pepcid, Glucerna BID, ferrous sulfate, insulin, medihoney, merrem, MVI, Juven BID, NS  Wt: no wt on record  PO:  75% po x last 6 documented meals  I/O's:  -6 L  Diet Order:   Diet Order             Diet NPO time specified  Diet effective ____           Diet Carb Modified Fluid consistency: Thin; Room service appropriate? Yes  Diet effective now                   EDUCATION NEEDS:   Not  appropriate for education at this time  Skin:  Skin Assessment: Skin Integrity Issues: Skin Integrity Issues:: Unstageable Unstageable: R buttocks  Last BM:  7/17 type 6  Height:   Ht Readings from Last 1 Encounters:  04/11/23 5\' 4"  (1.626 m)    Weight:   Wt Readings from Last 1 Encounters:  04/11/23 87 kg    Ideal Body Weight:     BMI:  There is no height or weight on file to calculate BMI.  Estimated Nutritional Needs:   Kcal:  1700-1900  Protein:  100-130 g  Fluid:  >/= 1.9L    Leodis Rains, RDN, LDN  Clinical Nutrition

## 2023-04-30 NOTE — Progress Notes (Signed)
PROGRESS NOTE    John Meza  CZY:[id number] DOB: [birth year] DOA: 04/23/2023 PCP: Storm Frisk, MD   Brief Narrative:  This 62 year old male with history of paraplegia, neurogenic bladder requiring self cath, recurrent ESBL UTI, diabetes type 2, hypertension, chronic right buttock pressure ulcer who presented with fever. He was recently hospitalized here in June for complicated UTI due to Klebsiella pneumonia, Morganella morganii and was discharged on Cipro. Report of fever with nausea and vomiting. On presentation, lab work showed WBC count of 26,000, lactate of 3. UA suspicious for pyuria. Started on Merem. Continued to be febrile intermittently. Urine culture has not shown any growth. CT abdomen/pelvis showed large collection around the right hip. IR consulted for drain. Orthopedics also consulted.  Planning hip disarticulation 7/19   Assessment & Plan:   Principal Problem:   Sepsis (HCC) Active Problems:   Controlled type 2 diabetes mellitus (HCC)   Paraplegia (HCC)   Hyponatremia   Essential hypertension   Complicated UTI (urinary tract infection)   Normocytic anemia   Hyperlipidemia associated with type 2 diabetes mellitus (HCC)   Leukocytosis   Fever of unknown origin   Septic hip (HCC)   Osteomyelitis of right hip (HCC)  Septic arthritis Right hip: -Presented with leukocytosis, fever, tachycardia, dyspnea.  Elevated lactate.  Blood cultures not showing any growth.   -Suspected UTI but urine culture did not show any growth.  Started on [place] .Procalcitonin found to be elevated.   -Continued to spike fever intermittently.. Chest x-ray did not show pneumonia. >>CT abdomen/pelvis showed large collection on the right hip.  Right hip area also found to be edematous.   -Patient is paraplegic and does not have any sensation she did not complain of any pain. -Orthopedics consulted>>appreciate Dr. Lajoyce Corners -aspirate culture = ESBL E coli  -ID consulted>>continue  merrem, d/c vanco -Dr. Duda>>hip disarticulation--planned 7/19 Patient and Family requesting second opinion.   Recurrent complicated UTI: History of ESBL E. coli UTI in February 2021, treated for Klebsiella/Morganella UTI a month ago.   Patient has neurogenic bladder, requires self cath.   Currently on Foley cath.  Plan from previous hospitalist to potentially discharge with indwelling foley.  -7/12 Urine culture did not show any growth   Nausea/vomiting: Was on ciprofloxacin on discharge from here which could have contributed.  Continue antiemetics.  -Resolved--tolerating diet   Diabetes type 2: -04/12/23 A1c of 7.  On Glucophage, Januvia at home.   -Currently on sliding scale - basal added   Hypertension:  -HCTZ discontinued for hyponatremia -arb on hold as well   Hyperlipidemia: Continue Lipitor   Paraplegia: On wheelchair-bound.  Has neurogenic bladder.     Chronic right buttock pressure ulcer: Wound care following.     Hyponatremia: Looks like this is a chronic problem: On reviewing his previous lab works, he is sodium has been in the range of 120s.   Discontinue hydrochlorothiazide.  Hold ARB as well.  Monitor.   Follow serum osm, urine osm, urine sodium   Normocytic  anemia: Iron Def Anemia Suspect AOCD with iron def PO iron every other day Maintain active type and screen    DVT prophylaxis: Lovenox Code Status:Full code Family Communication: None Disposition Plan:   Status is: Inpatient Remains inpatient appropriate because: Continued need for inpatient care   Consultants:  ID  Ortho  Procedures: None  Antimicrobials:  Anti-infectives (From admission, onward)    Start     Dose/Rate Route Frequency Ordered Stop   04/30/23 1300  ceFAZolin (  ANCEF) IVPB 2g/100 mL premix        2 g 200 mL/hr over 30 Minutes Intravenous To ShortStay [facility] 04/29/23 2039 05/01/23 1300   04/29/23 1400  meropenem (MERREM) 1 g in sodium chloride 0.9 % 100 mL IVPB        1  g 200 mL/hr over 30 Minutes Intravenous Every 8 hours 04/29/23 0832     04/28/23 1400  meropenem (MERREM) 2 g in sodium chloride 0.9 % 100 mL IVPB  Status:  Discontinued        2 g 280 mL/hr over 30 Minutes Intravenous Every 8 hours 04/28/23 1305 04/29/23 0832   04/27/23 1800  vancomycin (VANCOREADY) IVPB 1500 mg/300 mL  Status:  Discontinued        1,500 mg 150 mL/hr over 120 Minutes Intravenous Every 24 hours 04/26/23 1656 04/28/23 0934   04/26/23 1730  vancomycin (VANCOREADY) IVPB 1750 mg/350 mL        1,750 mg 175 mL/hr over 120 Minutes Intravenous  Once 04/26/23 1656 04/26/23 1922   04/24/23 1100  meropenem (MERREM) 1 g in sodium chloride 0.9 % 100 mL IVPB  Status:  Discontinued        1 g 200 mL/hr over 30 Minutes Intravenous Every 8 hours 04/24/23 0953 04/28/23 1305   04/23/23 0445  ceFEPIme (MAXIPIME) 2 g in sodium chloride 0.9 % 100 mL IVPB  Status:  Discontinued        2 g 200 mL/hr over 30 Minutes Intravenous  Once 04/23/23 0438 04/23/23 0443   04/23/23 0445  meropenem (MERREM) 1 g in sodium chloride 0.9 % 100 mL IVPB        1 g 200 mL/hr over 30 Minutes Intravenous  Once 04/23/23 0443 04/23/23 0550      Subjective: Patient was seen and examined at bed side. Overnight event noted,  Patient remains bed bound , requests to have second opinion about surgery. Spanish interpreter  Johnson # [phone] used.  Objective: Vitals:   04/29/23 2045 04/30/23 0748 04/30/23 0753 04/30/23 1425  BP: 130/68 119/72 119/72 128/71  Pulse: (!) 102 100 (!) 101 99  Resp: 16  16 14   Temp: 99.7 F (37.6 C)  98.9 F (37.2 C) 98.6 F (37 C)  TempSrc: Oral  Oral Oral  SpO2: 98% 98% 99% 99%    Intake/Output Summary (Last 24 hours) at 04/30/2023 1506 Last data filed at 04/29/2023 1507 Gross per 24 hour  Intake --  Output 1000 ml  Net -1000 ml   There were no vitals filed for this visit.  Examination:  General exam: Appears calm and comfortable, not in distress. Respiratory system: Clear to  auscultation. Respiratory effort normal. Cardiovascular system: S1 & S2 heard, RRR.  No pedal edema. Gastrointestinal system: Abdomen is nondistended, soft and nontender. Normal bowel sounds heard. Central nervous system: Alert and oriented. No focal neurological deficits. Extremities: RLE swelling Skin: No rashes, lesions or ulcers Psychiatry: Judgement and insight appear normal. Mood & affect appropriate.     Data Reviewed: I have personally reviewed following labs and imaging studies  CBC: Recent Labs  Lab 04/25/23 0144 04/26/23 0257 04/27/23 0008 04/28/23 0005 04/29/23 0131  WBC 17.7* 13.4* 16.2* 13.8* 12.1*  HGB 8.7* 7.7* 7.5* 7.1* 7.2*  HCT 27.1* 23.4* 23.4* 22.1* 22.6*  MCV 86.3 84.8 86.7 86.3 85.9  PLT 458* 422* 431* 407* 428*   Basic Metabolic Panel: Recent Labs  Lab 04/25/23 0144 04/26/23 0257 04/27/23 0008 04/28/23 0005 04/29/23 0131  NA 128* 127* 126* 127* 128*  K 4.1 3.8 3.9 4.0 3.8  CL 96* 97* 100 102 101  CO2 23 23 21* 19* 19*  GLUCOSE 175* 220* 210* 252* 215*  BUN 22* 27* 32* 26* 31*  CREATININE 0.83 0.79 0.70 0.75 0.89  CALCIUM 8.3* 7.8* 7.6* 7.7* 8.0*  MG  --   --   --   --  1.4*   GFR: CrCl cannot be calculated (Unknown ideal weight.). Liver Function Tests: No results for input(s): "AST", "ALT", "ALKPHOS", "BILITOT", "PROT", "ALBUMIN" in the last 168 hours. No results for input(s): "LIPASE", "AMYLASE" in the last 168 hours. No results for input(s): "AMMONIA" in the last 168 hours. Coagulation Profile: No results for input(s): "INR", "PROTIME" in the last 168 hours. Cardiac Enzymes: No results for input(s): "CKTOTAL", "CKMB", "CKMBINDEX", "TROPONINI" in the last 168 hours. BNP (last 3 results) No results for input(s): "PROBNP" in the last 8760 hours. HbA1C: No results for input(s): "HGBA1C" in the last 72 hours. CBG: Recent Labs  Lab 04/29/23 1147 04/29/23 1609 04/29/23 2131 04/30/23 0745 04/30/23 1211  GLUCAP 189* 229* 217* 227*  253*   Lipid Profile: No results for input(s): "CHOL", "HDL", "LDLCALC", "TRIG", "CHOLHDL", "LDLDIRECT" in the last 72 hours. Thyroid Function Tests: No results for input(s): "TSH", "T4TOTAL", "FREET4", "T3FREE", "THYROIDAB" in the last 72 hours. Anemia Panel: Recent Labs    04/29/23 0131  VITAMINB12 698  FERRITIN 483*  TIBC NOT CALCULATED  IRON 15*   Sepsis Labs: No results for input(s): "PROCALCITON", "LATICACIDVEN" in the last 168 hours.  Recent Results (from the past 240 hour(s))  Resp panel by RT-PCR (RSV, Flu A&B, Covid) Anterior Nasal Swab     Status: None   Collection Time: 04/23/23  4:37 AM   Specimen: Anterior Nasal Swab  Result Value Ref Range Status   SARS Coronavirus 2 by RT PCR NEGATIVE NEGATIVE Final   Influenza A by PCR NEGATIVE NEGATIVE Final   Influenza B by PCR NEGATIVE NEGATIVE Final    Comment: (NOTE) The Xpert Xpress SARS-CoV-2/FLU/RSV plus assay is intended as an aid in the diagnosis of influenza from Nasopharyngeal swab specimens and should not be used as a sole basis for treatment. Nasal washings and aspirates are unacceptable for Xpert Xpress SARS-CoV-2/FLU/RSV testing.  Fact Sheet for Patients: BloggerCourse.com  Fact Sheet for Healthcare Providers: SeriousBroker.it  This test is not yet approved or cleared by the [place] FDA and has been authorized for detection and/or diagnosis of SARS-CoV-2 by FDA under an Emergency Use Authorization (EUA). This EUA will remain in effect (meaning this test can be used) for the duration of the COVID-19 declaration under Section 564(b)(1) of the Act, 21 U.S.C. section 360bbb-3(b)(1), unless the authorization is terminated or revoked.     Resp Syncytial Virus by PCR NEGATIVE NEGATIVE Final    Comment: (NOTE) Fact Sheet for Patients: BloggerCourse.com  Fact Sheet for Healthcare  Providers: SeriousBroker.it  This test is not yet approved or cleared by the [place] FDA and has been authorized for detection and/or diagnosis of SARS-CoV-2 by FDA under an Emergency Use Authorization (EUA). This EUA will remain in effect (meaning this test can be used) for the duration of the COVID-19 declaration under Section 564(b)(1) of the Act, 21 U.S.C. section 360bbb-3(b)(1), unless the authorization is terminated or revoked.  Performed at [facility], [address], [place], [place] [address]   Blood Culture (routine x 2)     Status: None   Collection Time:  04/23/23  4:39 AM   Specimen: BLOOD LEFT ARM  Result Value Ref Range Status   Specimen Description BLOOD LEFT ARM  Final   Special Requests   Final    BOTTLES DRAWN AEROBIC AND ANAEROBIC Blood Culture results may not be optimal due to an excessive volume of blood received in culture bottles   Culture   Final    NO GROWTH 5 DAYS Performed at [facility], [address], [place], [place] [address]    Report Status 04/28/2023 FINAL  Final  Blood Culture (routine x 2)     Status: None   Collection Time: 04/23/23  4:50 AM   Specimen: BLOOD  Result Value Ref Range Status   Specimen Description BLOOD SITE NOT SPECIFIED  Final   Special Requests   Final    BOTTLES DRAWN AEROBIC AND ANAEROBIC Blood Culture results may not be optimal due to an excessive volume of blood received in culture bottles   Culture   Final    NO GROWTH 5 DAYS Performed at [facility], [address], [place], [place] [address]    Report Status 04/28/2023 FINAL  Final  Urine Culture     Status: None   Collection Time: 04/23/23  5:43 AM   Specimen: Urine, Random  Result Value Ref Range Status   Specimen Description URINE, RANDOM  Final   Special Requests NONE Reflexed from [id number]  Final   Culture   Final    NO GROWTH Performed at [facility], [address], [place], [place] [address]     Report Status 04/24/2023 FINAL  Final  Body fluid culture w Gram Stain     Status: None (Preliminary result)   Collection Time: 04/27/23  2:35 PM   Specimen: Joint, Right Hip; Synovial Fluid  Result Value Ref Range Status   Specimen Description SYNOVIAL  Final   Special Requests RIGHT HIP  Final   Gram Stain   Final    ABUNDANT WBC PRESENT,BOTH PMN AND MONONUCLEAR RARE GRAM NEGATIVE RODS CRITICAL RESULT CALLED TO, READ BACK BY AND VERIFIED WITH: RN Shawnie Dapper ON 04/27/23 @ 1702 BY DRT    Culture   Final    FEW ESCHERICHIA COLI Confirmed Extended Spectrum Beta-Lactamase Producer (ESBL).  In bloodstream infections from ESBL organisms, carbapenems are preferred over piperacillin/tazobactam. They are shown to have a lower risk of mortality. Sent to [facility] for further susceptibility testing. Performed at [facility], [address], [place], [place] [address]    Report Status PENDING  Incomplete   Organism ID, Bacteria ESCHERICHIA COLI  Final      Susceptibility   Escherichia coli - MIC*    AMPICILLIN >=32 RESISTANT Resistant     CEFEPIME 1 SENSITIVE Sensitive     CEFTAZIDIME RESISTANT Resistant     CEFTRIAXONE 32 RESISTANT Resistant     CIPROFLOXACIN >=4 RESISTANT Resistant     GENTAMICIN <=1 SENSITIVE Sensitive     IMIPENEM <=0.25 SENSITIVE Sensitive     TRIMETH/SULFA >=320 RESISTANT Resistant     AMPICILLIN/SULBACTAM >=32 RESISTANT Resistant     PIP/TAZO 8 SENSITIVE Sensitive     * FEW ESCHERICHIA COLI  Surgical pcr screen     Status: None   Collection Time: 04/29/23  8:42 PM   Specimen: Nasal Mucosa; Nasal Swab  Result Value Ref Range Status   MRSA, PCR NEGATIVE NEGATIVE Final   Staphylococcus aureus NEGATIVE NEGATIVE Final    Comment: (NOTE) The Xpert  SA Assay (FDA approved for NASAL specimens in patients 15 years of age and older), is one component of a comprehensive surveillance program. It is not intended to diagnose infection nor to guide or monitor  treatment. Performed at [facility], [address], [place], [place] [address]    Radiology Studies: No results found.  Scheduled Meds:  atorvastatin  20 mg Oral Q1200   Chlorhexidine Gluconate Cloth  6 each Topical Q0600   docusate sodium  100 mg Oral BID   enoxaparin (LOVENOX) injection  40 mg Subcutaneous Q24H   famotidine  20 mg Oral BID   feeding supplement (GLUCERNA SHAKE)  237 mL Oral BID BM   ferrous sulfate  325 mg Oral QODAY   insulin aspart  0-15 Units Subcutaneous TID WC   insulin aspart  0-5 Units Subcutaneous QHS   insulin aspart  3 Units Subcutaneous TID WC   insulin glargine-yfgn  12 Units Subcutaneous Daily   leptospermum manuka honey  1 Application Topical Daily   multivitamin with minerals  1 tablet Oral Daily   nutrition supplement (JUVEN)  1 packet Oral BID BM   povidone-iodine  2 Application Topical Once   sodium chloride flush  3 mL Intravenous Q12H   [START ON 05/01/2023] tranexamic acid (CYKLOKAPRON) 2,000 mg in sodium chloride 0.9 % 50 mL Topical Application  2,000 mg Topical To OR   Continuous Infusions:   ceFAZolin (ANCEF) IV     meropenem (MERREM) IV 1 g (04/30/23 1451)     LOS: 7 days    Time spent: 50 mins    Willeen Niece, MD [facility]   If 7PM-7AM, please contact night-coverage

## 2023-04-30 NOTE — Progress Notes (Signed)
Occupational Therapy Treatment Patient Details Name: John Meza MRN: [id number] DOB: June 14, 1961 Today's Date: 04/30/2023   History of present illness Patient is a 62 year old male admitted 7/12 presented with fever, N/V.   UA suspicious for pyuria vs. virus. Plan for right hip disarticulation surgery. PMH: paraplegia, neurogenic bladder requiring self cath, recurrent ESBL UTI, diabetes type 2, hypertension, chronic right buttock pressure ulcer He was recently hospitalized here in June for complicated UTI due to Klebsiella pneumonia, Morganella morganii and was discharged on Cipro.  Report of fever with nausea and vomiting.   OT comments  Focus of session on B UE strengthening with level 3 theraband. Wife reports pt is faithful to doing exercises outside of therapy. Encouraged OOB to w/c and w/c pushups, but declining this visit.    Recommendations for follow up therapy are one component of a multi-disciplinary discharge planning process, led by the attending physician.  Recommendations may be updated based on patient status, additional functional criteria and insurance authorization.    Assistance Recommended at Discharge Frequent or constant Supervision/Assistance  Patient can return home with the following  Help with stairs or ramp for entrance;Assist for transportation;Assistance with cooking/housework;A lot of help with bathing/dressing/bathroom;A lot of help with walking and/or transfers   Equipment Recommendations  None recommended by OT    Recommendations for Other Services      Precautions / Restrictions Precautions Precautions: Fall Precaution Comments: Wound to right buttocks Restrictions Weight Bearing Restrictions: No RLE Weight Bearing: Non weight bearing LLE Weight Bearing: Non weight bearing Other Position/Activity Restrictions: Patient has no feeling or AROM to B LE's       Mobility Bed Mobility                    Transfers                          Balance                                           ADL either performed or assessed with clinical judgement   ADL                                              Extremity/Trunk Assessment              Vision       Perception     Praxis      Cognition Arousal/Alertness: Awake/alert Behavior During Therapy: Flat affect Overall Cognitive Status: Within Functional Limits for tasks assessed                                          Exercises General Exercises - Upper Extremity Shoulder Flexion: Strengthening, Both, 10 reps, Theraband, Supine Theraband Level (Shoulder Flexion): Level 3 (Green) Shoulder Horizontal ABduction: Strengthening, Both, 10 reps, Theraband, Supine Theraband Level (Shoulder Horizontal Abduction): Level 3 (Green) Elbow Flexion: Strengthening, Both, 10 reps, Theraband, Supine Theraband Level (Elbow Flexion): Level 3 (Green) Elbow Extension: Strengthening, Both, Supine, 10 reps, Theraband Theraband Level (Elbow Extension): Level 3 (Green)    Shoulder Instructions       General Comments  VSS    Pertinent Vitals/ Pain       Pain Assessment Pain Assessment: No/denies pain  Home Living                                          Prior Functioning/Environment              Frequency  Min 1X/week        Progress Toward Goals  OT Goals(current goals can now be found in the care plan section)  Progress towards OT goals: Progressing toward goals  Acute Rehab OT Goals OT Goal Formulation: With patient/family Time For Goal Achievement: 05/08/23 Potential to Achieve Goals: Good  Plan Discharge plan remains appropriate    Co-evaluation                 AM-PAC OT "6 Clicks" Daily Activity     Outcome Measure   Help from another person eating meals?: None Help from another person taking care of personal grooming?: A Little Help from another  person toileting, which includes using toliet, bedpan, or urinal?: Total Help from another person bathing (including washing, rinsing, drying)?: A Lot Help from another person to put on and taking off regular upper body clothing?: A Lot Help from another person to put on and taking off regular lower body clothing?: Total 6 Click Score: 13    End of Session    OT Visit Diagnosis: Muscle weakness (generalized) (M62.81)   Activity Tolerance Patient tolerated treatment well   Patient Left in bed;with call bell/phone within reach;with family/visitor present   Nurse Communication          Time: 1610-9604 OT Time Calculation (min): 20 min  Charges: OT General Charges $OT Visit: 1 Visit OT Treatments $Therapeutic Exercise: 8-22 mins Berna Spare, OTR/L Acute Rehabilitation Services Office: [phone]   Evern Bio 04/30/2023, 3:21 PM

## 2023-04-30 NOTE — H&P (View-Only) (Signed)
Patient ID: John Meza, male   DOB: August 08, 1961, 62 y.o.   MRN: [id number] Plan for right hip disarticulation Saturday, order place for 1 unit PRBC

## 2023-04-30 NOTE — Progress Notes (Signed)
Physical Therapy Treatment Patient Details Name: John Meza MRN: [id number] DOB: 1961/10/06 Today's Date: 04/30/2023   History of Present Illness Patient is a 62 year old male admitted 7/12 presented with fever, N/V.   UA suspicious for pyuria vs. virus. Plan for right hip disarticulation surgery. PMH: paraplegia, neurogenic bladder requiring self cath, recurrent ESBL UTI, diabetes type 2, hypertension, chronic right buttock pressure ulcer He was recently hospitalized here in June for complicated UTI due to Klebsiella pneumonia, Morganella morganii and was discharged on Cipro.  Report of fever with nausea and vomiting.    PT Comments  Pt admitted with above diagnosis. Pt was able to sit EOB for 14 min with assist to achieve sitting EOB but min guard to min assist once at EOB.  Pt also scooted to left with min assist as well.  Pt progressing slowly.   Pt currently with functional limitations due to the deficits listed below (see PT Problem List). Pt will benefit from acute skilled PT to increase their independence and safety with mobility to allow discharge.        Assistance Recommended at Discharge Frequent or constant Supervision/Assistance  If plan is discharge home, recommend the following:  Can travel by private vehicle    A lot of help with walking and/or transfers;A lot of help with bathing/dressing/bathroom;Assistance with cooking/housework;Direct supervision/assist for medications management;Assist for transportation;Help with stairs or ramp for entrance      Equipment Recommendations   (Air mattress overlay due to buttock wound, hoyer lift)    Recommendations for Other Services       Precautions / Restrictions Precautions Precautions: Fall Precaution Comments: Wound to right buttocks Restrictions Weight Bearing Restrictions: No RLE Weight Bearing: Non weight bearing LLE Weight Bearing: Non weight bearing Other Position/Activity Restrictions: Patient has no  feeling or AROM to B LE's     Mobility  Bed Mobility Overal bed mobility: Needs Assistance Bed Mobility: Rolling, Sidelying to Sit, Sit to Sidelying Rolling: Min assist Sidelying to sit: Mod assist, HOB elevated Supine to sit: HOB elevated, Max assist Sit to supine: Max assist   General bed mobility comments: Min assit to reach for bed rail and roll Lt with assist needed to bring lower trunk to sidelying. pt required Max assist to bring LE's off EOB fully and raise trunk upright. pt able to maintain seated balance with bil vs. unilateral UE support EOB. Pt completed weight shifting activities at EOB and Max assist to return to supine for LES with asist to boost superiorly to reposition in bed.    Transfers                        Ambulation/Gait                   Stairs             Wheelchair Mobility     Tilt Bed    Modified Rankin (Stroke Patients Only)       Balance Overall balance assessment: Needs assistance Sitting-balance support: Bilateral upper extremity supported, Feet supported, No upper extremity supported, Single extremity supported Sitting balance-Leahy Scale: Poor Sitting balance - Comments: Pt needed at least 1 UE support progressing to no UE support to maintain balance.Needs support to reach outside BOS.                                    Cognition  Arousal/Alertness: Awake/alert Behavior During Therapy: WFL for tasks assessed/performed Overall Cognitive Status: Within Functional Limits for tasks assessed                                          Exercises General Exercises - Lower Extremity Ankle Circles/Pumps: PROM, Both, 5 reps, Supine Heel Slides: PROM, Both, 10 reps, Supine Other Exercises Other Exercises: 10x forearm prop bil with press up to return to midline sitting    General Comments General comments (skin integrity, edema, etc.): VSS      Pertinent Vitals/Pain Pain  Assessment Pain Assessment: No/denies pain    Home Living                          Prior Function            PT Goals (current goals can now be found in the care plan section) Acute Rehab PT Goals Patient Stated Goal: to get stronger Progress towards PT goals: Progressing toward goals    Frequency    Min 1X/week      PT Plan Current plan remains appropriate    Co-evaluation              AM-PAC PT "6 Clicks" Mobility   Outcome Measure  Help needed turning from your back to your side while in a flat bed without using bedrails?: A Lot Help needed moving from lying on your back to sitting on the side of a flat bed without using bedrails?: Total Help needed moving to and from a bed to a chair (including a wheelchair)?: Total Help needed standing up from a chair using your arms (e.g., wheelchair or bedside chair)?: Total Help needed to walk in hospital room?: Total Help needed climbing 3-5 steps with a railing? : Total 6 Click Score: 7    End of Session   Activity Tolerance: Patient limited by fatigue Patient left: in bed;with call bell/phone within reach;with bed alarm set;with family/visitor present Nurse Communication: Mobility status;Need for lift equipment PT Visit Diagnosis: Muscle weakness (generalized) (M62.81);Other abnormalities of gait and mobility (R26.89)     Time: 6578-4696 PT Time Calculation (min) (ACUTE ONLY): 22 min  Charges:    $Therapeutic Activity: 8-22 mins PT General Charges $$ ACUTE PT VISIT: 1 Visit                     [name],PT Acute Rehab Services [phone]    [name] 04/30/2023, 2:16 PM

## 2023-04-30 NOTE — Plan of Care (Signed)
  Problem: Clinical Measurements: Goal: Ability to maintain clinical measurements within normal limits will improve Outcome: Progressing Goal: Will remain free from infection Outcome: Progressing Goal: Diagnostic test results will improve Outcome: Progressing Goal: Respiratory complications will improve Outcome: Progressing Goal: Cardiovascular complication will be avoided Outcome: Completed/Met

## 2023-04-30 NOTE — Progress Notes (Signed)
Patient ID: John Meza, male   DOB: August 08, 1961, 62 y.o.   MRN: [id number] Plan for right hip disarticulation Saturday, order place for 1 unit PRBC

## 2023-05-01 ENCOUNTER — Inpatient Hospital Stay (HOSPITAL_COMMUNITY): Payer: Medicaid Other | Admitting: Certified Registered"

## 2023-05-01 ENCOUNTER — Encounter (HOSPITAL_COMMUNITY)
Admission: EM | Disposition: A | Discharge status: Home Health | Payer: Self-pay | Source: Home / Self Care | Attending: Family Medicine

## 2023-05-01 ENCOUNTER — Encounter (HOSPITAL_COMMUNITY): Payer: Self-pay | Admitting: Internal Medicine

## 2023-05-01 ENCOUNTER — Other Ambulatory Visit: Payer: Self-pay

## 2023-05-01 DIAGNOSIS — S7000XA Contusion of unspecified hip, initial encounter: Secondary | ICD-10-CM

## 2023-05-01 DIAGNOSIS — R578 Other shock: Secondary | ICD-10-CM

## 2023-05-01 DIAGNOSIS — I10 Essential (primary) hypertension: Secondary | ICD-10-CM

## 2023-05-01 DIAGNOSIS — K651 Peritoneal abscess: Secondary | ICD-10-CM

## 2023-05-01 DIAGNOSIS — L02415 Cutaneous abscess of right lower limb: Secondary | ICD-10-CM

## 2023-05-01 DIAGNOSIS — M86651 Other chronic osteomyelitis, right thigh: Secondary | ICD-10-CM

## 2023-05-01 DIAGNOSIS — S7001XA Contusion of right hip, initial encounter: Secondary | ICD-10-CM

## 2023-05-01 DIAGNOSIS — J9589 Other postprocedural complications and disorders of respiratory system, not elsewhere classified: Secondary | ICD-10-CM

## 2023-05-01 DIAGNOSIS — Z87891 Personal history of nicotine dependence: Secondary | ICD-10-CM

## 2023-05-01 HISTORY — DX: Peritoneal abscess: K65.1

## 2023-05-01 HISTORY — PX: APPLICATION OF WOUND VAC: SHX5189

## 2023-05-01 HISTORY — PX: WOUND EXPLORATION: SHX6188

## 2023-05-01 HISTORY — PX: HIP DISARTICULATION: SHX5851

## 2023-05-01 LAB — BPAM FFP
Blood Product Expiration Date: 202407252359
Blood Product Expiration Date: 202407252359
Blood Product Expiration Date: 202407252359
Blood Product Expiration Date: 202407252359
Blood Product Expiration Date: 202407252359
Blood Product Expiration Date: 202407252359
Blood Product Expiration Date: 202407252359
Blood Product Expiration Date: 202407252359
ISSUE DATE / TIME: 202407201158
ISSUE DATE / TIME: 202407201236
Unit Type and Rh: 6200
Unit Type and Rh: 6200
Unit Type and Rh: 6200
Unit Type and Rh: 6200

## 2023-05-01 LAB — GLUCOSE, CAPILLARY
Glucose-Capillary: 174 mg/dL — ABNORMAL HIGH (ref 70–99)
Glucose-Capillary: 172 mg/dL — ABNORMAL HIGH (ref 70–99)
Glucose-Capillary: 190 mg/dL — ABNORMAL HIGH (ref 70–99)
Glucose-Capillary: 247 mg/dL — ABNORMAL HIGH (ref 70–99)
Glucose-Capillary: 253 mg/dL — ABNORMAL HIGH (ref 70–99)
Glucose-Capillary: 170 mg/dL — ABNORMAL HIGH (ref 70–99)
Glucose-Capillary: 136 mg/dL — ABNORMAL HIGH (ref 70–99)

## 2023-05-01 LAB — TYPE AND SCREEN
Antibody Screen: NEGATIVE
Unit division: 0
Unit division: 0
Unit division: 0
Unit division: 0
Unit division: 0
Unit division: 0
Unit division: 0

## 2023-05-01 LAB — CBC WITH DIFFERENTIAL/PLATELET
Abs Immature Granulocytes: 1.07 10*3/uL — ABNORMAL HIGH (ref 0.00–0.07)
Basophils Absolute: 0.1 10*3/uL (ref 0.0–0.1)
Basophils Relative: 1 %
Eosinophils Absolute: 0.1 10*3/uL (ref 0.0–0.5)
Eosinophils Relative: 0 %
HCT: 31 % — ABNORMAL LOW (ref 39.0–52.0)
Hemoglobin: 10.6 g/dL — ABNORMAL LOW (ref 13.0–17.0)
Immature Granulocytes: 5 %
Lymphocytes Relative: 3 %
Lymphs Abs: 0.6 10*3/uL — ABNORMAL LOW (ref 0.7–4.0)
MCH: 30.6 pg (ref 26.0–34.0)
MCHC: 34.2 g/dL (ref 30.0–36.0)
MCV: 89.6 fL (ref 80.0–100.0)
Monocytes Absolute: 1.2 10*3/uL — ABNORMAL HIGH (ref 0.1–1.0)
Monocytes Relative: 6 %
Neutro Abs: 17.7 10*3/uL — ABNORMAL HIGH (ref 1.7–7.7)
Neutrophils Relative %: 85 %
Platelets: 104 10*3/uL — ABNORMAL LOW (ref 150–400)
RBC: 3.46 MIL/uL — ABNORMAL LOW (ref 4.22–5.81)
RDW: 14.8 % (ref 11.5–15.5)
Smear Review: NORMAL
WBC: 20.8 10*3/uL — ABNORMAL HIGH (ref 4.0–10.5)
nRBC: 0 % (ref 0.0–0.2)

## 2023-05-01 LAB — BPAM RBC
Blood Product Expiration Date: 202408232359
Blood Product Expiration Date: 202408232359
Blood Product Expiration Date: 202408232359
Blood Product Expiration Date: 202408232359
Blood Product Expiration Date: 202408242359
Blood Product Expiration Date: 202408242359
Blood Product Expiration Date: 202408242359
ISSUE DATE / TIME: 202407191640
ISSUE DATE / TIME: 202407201153
ISSUE DATE / TIME: 202407201153
ISSUE DATE / TIME: 202407201232
ISSUE DATE / TIME: 202407201232
Unit Type and Rh: 5100
Unit Type and Rh: 5100
Unit Type and Rh: 5100
Unit Type and Rh: 5100
Unit Type and Rh: 5100
Unit Type and Rh: 5100
Unit Type and Rh: 5100
Unit Type and Rh: 5100
Unit Type and Rh: 5100
Unit Type and Rh: 5100
Unit Type and Rh: 5100

## 2023-05-01 LAB — POCT I-STAT 7, (LYTES, BLD GAS, ICA,H+H)
Acid-base deficit: 12 mmol/L — ABNORMAL HIGH (ref 0.0–2.0)
Acid-base deficit: 10 mmol/L — ABNORMAL HIGH (ref 0.0–2.0)
Acid-base deficit: 7 mmol/L — ABNORMAL HIGH (ref 0.0–2.0)
Bicarbonate: 12.9 mmol/L — ABNORMAL LOW (ref 20.0–28.0)
Bicarbonate: 15.2 mmol/L — ABNORMAL LOW (ref 20.0–28.0)
Bicarbonate: 18 mmol/L — ABNORMAL LOW (ref 20.0–28.0)
Calcium, Ion: 1.4 mmol/L (ref 1.15–1.40)
Calcium, Ion: 0.98 mmol/L — ABNORMAL LOW (ref 1.15–1.40)
Calcium, Ion: 1 mmol/L — ABNORMAL LOW (ref 1.15–1.40)
HCT: 15 % — ABNORMAL LOW (ref 39.0–52.0)
HCT: 30 % — ABNORMAL LOW (ref 39.0–52.0)
HCT: 33 % — ABNORMAL LOW (ref 39.0–52.0)
Hemoglobin: 5.1 g/dL — CL (ref 13.0–17.0)
Hemoglobin: 10.2 g/dL — ABNORMAL LOW (ref 13.0–17.0)
Hemoglobin: 11.2 g/dL — ABNORMAL LOW (ref 13.0–17.0)
O2 Saturation: 97 %
O2 Saturation: 100 %
O2 Saturation: 100 %
Patient temperature: 35.4
Patient temperature: 35.7
Potassium: 4.2 mmol/L (ref 3.5–5.1)
Potassium: 3.9 mmol/L (ref 3.5–5.1)
Potassium: 4.2 mmol/L (ref 3.5–5.1)
Sodium: 136 mmol/L (ref 135–145)
Sodium: 136 mmol/L (ref 135–145)
Sodium: 137 mmol/L (ref 135–145)
TCO2: 14 mmol/L — ABNORMAL LOW (ref 22–32)
TCO2: 16 mmol/L — ABNORMAL LOW (ref 22–32)
TCO2: 19 mmol/L — ABNORMAL LOW (ref 22–32)
pCO2 arterial: 25.8 mmHg — ABNORMAL LOW (ref 32–48)
pCO2 arterial: 29.8 mmHg — ABNORMAL LOW (ref 32–48)
pCO2 arterial: 30.1 mmHg — ABNORMAL LOW (ref 32–48)
pH, Arterial: 7.306 — ABNORMAL LOW (ref 7.35–7.45)
pH, Arterial: 7.307 — ABNORMAL LOW (ref 7.35–7.45)
pH, Arterial: 7.379 (ref 7.35–7.45)
pO2, Arterial: 96 mmHg (ref 83–108)
pO2, Arterial: 297 mmHg — ABNORMAL HIGH (ref 83–108)
pO2, Arterial: 444 mmHg — ABNORMAL HIGH (ref 83–108)

## 2023-05-01 LAB — PHOSPHORUS: Phosphorus: 5.8 mg/dL — ABNORMAL HIGH (ref 2.5–4.6)

## 2023-05-01 LAB — PREPARE FRESH FROZEN PLASMA

## 2023-05-01 LAB — PREPARE RBC (CROSSMATCH)

## 2023-05-01 LAB — BASIC METABOLIC PANEL
Anion gap: 11 (ref 5–15)
BUN: 19 mg/dL (ref 6–20)
CO2: 19 mmol/L — ABNORMAL LOW (ref 22–32)
Calcium: 8.6 mg/dL — ABNORMAL LOW (ref 8.9–10.3)
Chloride: 106 mmol/L (ref 98–111)
Creatinine, Ser: 0.69 mg/dL (ref 0.61–1.24)
GFR, Estimated: >60 mL/min (ref >60)
Glucose, Bld: 260 mg/dL — ABNORMAL HIGH (ref 70–99)
Potassium: 3.8 mmol/L (ref 3.5–5.1)
Sodium: 136 mmol/L (ref 135–145)

## 2023-05-01 LAB — PREPARE PLATELET PHERESIS

## 2023-05-01 LAB — PREPARE CRYOPRECIPITATE: Unit division: 0

## 2023-05-01 LAB — AEROBIC/ANAEROBIC CULTURE W GRAM STAIN (SURGICAL/DEEP WOUND)

## 2023-05-01 LAB — BPAM PLATELET PHERESIS
ISSUE DATE / TIME: 202407201308
Unit Type and Rh: 5100

## 2023-05-01 LAB — BPAM CRYOPRECIPITATE: Blood Product Expiration Date: 202407252359

## 2023-05-01 LAB — MAGNESIUM: Magnesium: 1.1 mg/dL — ABNORMAL LOW (ref 1.7–2.4)

## 2023-05-01 LAB — MASSIVE TRANSFUSION PROTOCOL ORDER (BLOOD BANK NOTIFICATION)

## 2023-05-01 SURGERY — WOUND EXPLORATION
Anesthesia: Choice | Site: Hip | Laterality: Right

## 2023-05-01 SURGERY — DISARTICULATION, HIP
Anesthesia: General | Site: Hip | Laterality: Right

## 2023-05-01 MED ORDER — FENTANYL CITRATE (PF) 250 MCG/5ML IJ SOLN
INTRAMUSCULAR | Status: DC | PRN
  Administered 2023-05-01: 100 ug via INTRAVENOUS
  Administered 2023-05-01: 150 ug via INTRAVENOUS

## 2023-05-01 MED ORDER — PANTOPRAZOLE SODIUM 40 MG IV SOLR
40.0000 mg | INTRAVENOUS | Status: DC
  Administered 2023-05-01: 40 mg via INTRAVENOUS
  Filled 2023-05-01: qty 10

## 2023-05-01 MED ORDER — CEFAZOLIN SODIUM-DEXTROSE 2-4 GM/100ML-% IV SOLN
INTRAVENOUS | Status: AC
  Filled 2023-05-01: qty 100

## 2023-05-01 MED ORDER — CHLORHEXIDINE GLUCONATE 0.12 % MT SOLN
OROMUCOSAL | Status: AC
  Administered 2023-05-01: 15 mL via OROMUCOSAL
  Filled 2023-05-01: qty 15

## 2023-05-01 MED ORDER — FAMOTIDINE 20 MG PO TABS
20.0000 mg | ORAL_TABLET | Freq: Two times a day (BID) | ORAL | Status: DC
  Administered 2023-05-01 – 2023-05-02 (×2): 20 mg
  Filled 2023-05-01 (×2): qty 1

## 2023-05-01 MED ORDER — ACETAMINOPHEN 160 MG/5ML PO SOLN: 1000.0000 mg | Freq: Once | ORAL | Status: DC | PRN

## 2023-05-01 MED ORDER — POTASSIUM CHLORIDE CRYS ER 20 MEQ PO TBCR: 20.0000 meq | EXTENDED_RELEASE_TABLET | Freq: Every day | ORAL | Status: DC | PRN

## 2023-05-01 MED ORDER — NOREPINEPHRINE 4 MG/250ML-% IV SOLN
INTRAVENOUS | Status: AC
  Filled 2023-05-01: qty 250

## 2023-05-01 MED ORDER — ORAL CARE MOUTH RINSE: 15.0000 mL | OROMUCOSAL | Status: DC | PRN

## 2023-05-01 MED ORDER — LACTATED RINGERS IV SOLN: INTRAVENOUS | Status: DC | PRN

## 2023-05-01 MED ORDER — TRANEXAMIC ACID-NACL 1000-0.7 MG/100ML-% IV SOLN
1000.0000 mg | Freq: Once | INTRAVENOUS | Status: AC
  Administered 2023-05-01: 1000 mg via INTRAVENOUS
  Filled 2023-05-01: qty 100

## 2023-05-01 MED ORDER — SODIUM CHLORIDE 0.9% IV SOLUTION: Freq: Once | INTRAVENOUS | Status: DC

## 2023-05-01 MED ORDER — MIDAZOLAM HCL 5 MG/5ML IJ SOLN
INTRAMUSCULAR | Status: DC | PRN
  Administered 2023-05-01 (×2): 1 mg via INTRAVENOUS

## 2023-05-01 MED ORDER — PHENYLEPHRINE 80 MCG/ML (10ML) SYRINGE FOR IV PUSH (FOR BLOOD PRESSURE SUPPORT)
PREFILLED_SYRINGE | INTRAVENOUS | Status: AC
  Filled 2023-05-01: qty 10

## 2023-05-01 MED ORDER — MAGNESIUM SULFATE 4 GM/100ML IV SOLN
4.0000 g | Freq: Once | INTRAVENOUS | Status: AC
  Administered 2023-05-01: 4 g via INTRAVENOUS
  Filled 2023-05-01: qty 100

## 2023-05-01 MED ORDER — POLYETHYLENE GLYCOL 3350 17 G PO PACK: 17.0000 g | PACK | Freq: Every day | ORAL | Status: DC | PRN

## 2023-05-01 MED ORDER — ALBUMIN HUMAN 5 % IV SOLN: INTRAVENOUS | Status: DC | PRN

## 2023-05-01 MED ORDER — FENTANYL CITRATE PF 50 MCG/ML IJ SOSY: 50.0000 ug | PREFILLED_SYRINGE | INTRAMUSCULAR | Status: DC | PRN

## 2023-05-01 MED ORDER — MIDAZOLAM HCL 2 MG/2ML IJ SOLN
INTRAMUSCULAR | Status: AC
  Filled 2023-05-01: qty 2

## 2023-05-01 MED ORDER — DOCUSATE SODIUM 100 MG PO CAPS: 100.0000 mg | ORAL_CAPSULE | Freq: Every day | ORAL | Status: DC

## 2023-05-01 MED ORDER — BISACODYL 5 MG PO TBEC: 5.0000 mg | DELAYED_RELEASE_TABLET | Freq: Every day | ORAL | Status: DC | PRN

## 2023-05-01 MED ORDER — LACTATED RINGERS IV SOLN: INTRAVENOUS | Status: DC

## 2023-05-01 MED ORDER — GLYCOPYRROLATE PF 0.2 MG/ML IJ SOSY
PREFILLED_SYRINGE | INTRAMUSCULAR | Status: AC
  Filled 2023-05-01: qty 1

## 2023-05-01 MED ORDER — ORAL CARE MOUTH RINSE
15.0000 mL | OROMUCOSAL | Status: DC
  Administered 2023-05-01 (×2): 15 mL via OROMUCOSAL

## 2023-05-01 MED ORDER — ROCURONIUM BROMIDE 10 MG/ML (PF) SYRINGE
PREFILLED_SYRINGE | INTRAVENOUS | Status: DC | PRN
  Administered 2023-05-01: 60 mg via INTRAVENOUS
  Administered 2023-05-01 (×2): 20 mg via INTRAVENOUS

## 2023-05-01 MED ORDER — ALUM & MAG HYDROXIDE-SIMETH 200-200-20 MG/5ML PO SUSP: 15.0000 mL | ORAL | Status: DC | PRN

## 2023-05-01 MED ORDER — CEFAZOLIN SODIUM-DEXTROSE 2-3 GM-%(50ML) IV SOLR
INTRAVENOUS | Status: DC | PRN
  Administered 2023-05-01: 2 g via INTRAVENOUS

## 2023-05-01 MED ORDER — CALCIUM CHLORIDE 10 % IV SOLN
INTRAVENOUS | Status: DC | PRN
  Administered 2023-05-01: 200 mg via INTRAVENOUS
  Administered 2023-05-01: 100 mg via INTRAVENOUS
  Administered 2023-05-01: 200 mg via INTRAVENOUS

## 2023-05-01 MED ORDER — METOPROLOL TARTRATE 5 MG/5ML IV SOLN: 2.0000 mg | INTRAVENOUS | Status: DC | PRN

## 2023-05-01 MED ORDER — PHENYLEPHRINE HCL-NACL 20-0.9 MG/250ML-% IV SOLN
INTRAVENOUS | Status: AC
  Filled 2023-05-01: qty 250

## 2023-05-01 MED ORDER — MIDAZOLAM HCL 2 MG/2ML IJ SOLN
INTRAMUSCULAR | Status: DC | PRN
  Administered 2023-05-01 (×2): 1 mg via INTRAVENOUS

## 2023-05-01 MED ORDER — INSULIN ASPART 100 UNIT/ML IJ SOLN
0.0000 [IU] | INTRAMUSCULAR | Status: DC | PRN
  Administered 2023-05-01: 2 [IU] via SUBCUTANEOUS

## 2023-05-01 MED ORDER — ACETAMINOPHEN 10 MG/ML IV SOLN: 1000.0000 mg | Freq: Once | INTRAVENOUS | Status: DC | PRN

## 2023-05-01 MED ORDER — FENTANYL CITRATE (PF) 250 MCG/5ML IJ SOLN
INTRAMUSCULAR | Status: DC | PRN
  Administered 2023-05-01: 25 ug via INTRAVENOUS
  Administered 2023-05-01: 50 ug via INTRAVENOUS
  Administered 2023-05-01: 25 ug via INTRAVENOUS

## 2023-05-01 MED ORDER — ATORVASTATIN CALCIUM 10 MG PO TABS
20.0000 mg | ORAL_TABLET | Freq: Every day | ORAL | Status: DC
  Administered 2023-05-02 – 2023-05-04 (×3): 20 mg
  Filled 2023-05-01 (×3): qty 2

## 2023-05-01 MED ORDER — PHENOL 1.4 % MT LIQD: 1.0000 | OROMUCOSAL | Status: DC | PRN

## 2023-05-01 MED ORDER — OXYCODONE HCL 5 MG PO TABS: 5.0000 mg | ORAL_TABLET | ORAL | Status: DC | PRN

## 2023-05-01 MED ORDER — PROPOFOL 1000 MG/100ML IV EMUL
0.0000 ug/kg/min | INTRAVENOUS | Status: DC
  Administered 2023-05-01: 20 ug/kg/min via INTRAVENOUS

## 2023-05-01 MED ORDER — EPHEDRINE 5 MG/ML INJ
INTRAVENOUS | Status: AC
  Filled 2023-05-01: qty 5

## 2023-05-01 MED ORDER — MAGNESIUM CITRATE PO SOLN: 1.0000 | Freq: Once | ORAL | Status: DC | PRN

## 2023-05-01 MED ORDER — SODIUM CHLORIDE 0.9 % IR SOLN
Status: DC | PRN
  Administered 2023-05-01 (×2): 1000 mL

## 2023-05-01 MED ORDER — MAGNESIUM SULFATE 2 GM/50ML IV SOLN: 2.0000 g | Freq: Every day | INTRAVENOUS | Status: DC | PRN

## 2023-05-01 MED ORDER — SODIUM CHLORIDE 0.9 % IV SOLN: INTRAVENOUS | Status: DC | PRN

## 2023-05-01 MED ORDER — JUVEN PO PACK: 1.0000 | PACK | Freq: Two times a day (BID) | ORAL | Status: DC

## 2023-05-01 MED ORDER — ACETAMINOPHEN 500 MG PO TABS: 1000.0000 mg | ORAL_TABLET | Freq: Once | ORAL | Status: DC | PRN

## 2023-05-01 MED ORDER — SODIUM CHLORIDE 0.9 % IV SOLN: INTRAVENOUS | Status: DC

## 2023-05-01 MED ORDER — OXYCODONE HCL 5 MG PO TABS: 10.0000 mg | ORAL_TABLET | ORAL | Status: DC | PRN

## 2023-05-01 MED ORDER — FENTANYL CITRATE (PF) 250 MCG/5ML IJ SOLN
INTRAMUSCULAR | Status: AC
  Filled 2023-05-01: qty 5

## 2023-05-01 MED ORDER — PROPOFOL 10 MG/ML IV BOLUS
INTRAVENOUS | Status: AC
  Filled 2023-05-01: qty 20

## 2023-05-01 MED ORDER — SUCCINYLCHOLINE CHLORIDE 200 MG/10ML IV SOSY
PREFILLED_SYRINGE | INTRAVENOUS | Status: DC | PRN
  Administered 2023-05-01: 120 mg via INTRAVENOUS

## 2023-05-01 MED ORDER — SUGAMMADEX SODIUM 200 MG/2ML IV SOLN
INTRAVENOUS | Status: DC | PRN
  Administered 2023-05-01 (×4): 100 mg via INTRAVENOUS

## 2023-05-01 MED ORDER — PANTOPRAZOLE SODIUM 40 MG PO TBEC: 40.0000 mg | DELAYED_RELEASE_TABLET | Freq: Every day | ORAL | Status: DC

## 2023-05-01 MED ORDER — CALCIUM CHLORIDE 10 % IV SOLN
INTRAVENOUS | Status: DC | PRN
  Administered 2023-05-01 (×2): 250 mg via INTRAVENOUS

## 2023-05-01 MED ORDER — POLYETHYLENE GLYCOL 3350 17 G PO PACK: 17.0000 g | PACK | Freq: Every day | ORAL | Status: DC

## 2023-05-01 MED ORDER — ROCURONIUM BROMIDE 10 MG/ML (PF) SYRINGE
PREFILLED_SYRINGE | INTRAVENOUS | Status: AC
  Filled 2023-05-01: qty 10

## 2023-05-01 MED ORDER — ADULT MULTIVITAMIN W/MINERALS CH
1.0000 | ORAL_TABLET | Freq: Every day | ORAL | Status: DC
  Administered 2023-05-02: 1
  Filled 2023-05-01: qty 1

## 2023-05-01 MED ORDER — TRANEXAMIC ACID-NACL 1000-0.7 MG/100ML-% IV SOLN
INTRAVENOUS | Status: AC | PRN
  Administered 2023-05-01: 1000 mg via INTRAVENOUS

## 2023-05-01 MED ORDER — ONDANSETRON HCL 4 MG/2ML IJ SOLN: 4.0000 mg | Freq: Four times a day (QID) | INTRAMUSCULAR | Status: DC | PRN

## 2023-05-01 MED ORDER — OXYCODONE HCL 5 MG PO TABS: 5.0000 mg | ORAL_TABLET | Freq: Once | ORAL | Status: DC | PRN

## 2023-05-01 MED ORDER — GUAIFENESIN-DM 100-10 MG/5ML PO SYRP: 15.0000 mL | ORAL_SOLUTION | ORAL | Status: DC | PRN

## 2023-05-01 MED ORDER — NOREPINEPHRINE 4 MG/250ML-% IV SOLN
INTRAVENOUS | Status: DC | PRN
  Administered 2023-05-01: 25 ug/min via INTRAVENOUS

## 2023-05-01 MED ORDER — FENTANYL CITRATE (PF) 100 MCG/2ML IJ SOLN: 25.0000 ug | INTRAMUSCULAR | Status: DC | PRN

## 2023-05-01 MED ORDER — VITAMIN C 500 MG PO TABS
1000.0000 mg | ORAL_TABLET | Freq: Every day | ORAL | Status: DC
  Administered 2023-05-02: 1000 mg
  Filled 2023-05-01: qty 2

## 2023-05-01 MED ORDER — VASOPRESSIN 20 UNIT/ML IV SOLN
INTRAVENOUS | Status: AC
  Filled 2023-05-01: qty 1

## 2023-05-01 MED ORDER — ETOMIDATE 2 MG/ML IV SOLN
INTRAVENOUS | Status: DC | PRN
  Administered 2023-05-01: 8 mg via INTRAVENOUS

## 2023-05-01 MED ORDER — PHENYLEPHRINE HCL-NACL 20-0.9 MG/250ML-% IV SOLN
INTRAVENOUS | Status: DC | PRN
  Administered 2023-05-01: 15 ug/min via INTRAVENOUS

## 2023-05-01 MED ORDER — ORAL CARE MOUTH RINSE: 15.0000 mL | Freq: Once | OROMUCOSAL | Status: AC

## 2023-05-01 MED ORDER — ZINC SULFATE 220 (50 ZN) MG PO CAPS: 220.0000 mg | ORAL_CAPSULE | Freq: Every day | ORAL | Status: DC

## 2023-05-01 MED ORDER — PROPOFOL 500 MG/50ML IV EMUL
INTRAVENOUS | Status: DC | PRN
  Administered 2023-05-01: 30 ug/kg/min via INTRAVENOUS

## 2023-05-01 MED ORDER — OXYCODONE HCL 5 MG/5ML PO SOLN: 5.0000 mg | Freq: Once | ORAL | Status: DC | PRN

## 2023-05-01 MED ORDER — VASOPRESSIN 20 UNIT/ML IV SOLN
INTRAVENOUS | Status: DC | PRN
  Administered 2023-05-01 (×3): 1 [IU] via INTRAVENOUS
  Administered 2023-05-01 (×2): .5 [IU] via INTRAVENOUS
  Administered 2023-05-01: 1 [IU] via INTRAVENOUS
  Administered 2023-05-01: .5 [IU] via INTRAVENOUS

## 2023-05-01 MED ORDER — HYDROMORPHONE HCL 1 MG/ML IJ SOLN: 0.5000 mg | INTRAMUSCULAR | Status: DC | PRN

## 2023-05-01 MED ORDER — PROPOFOL 10 MG/ML IV BOLUS
INTRAVENOUS | Status: DC | PRN
Start: 2023-05-01 — End: 2023-05-01
  Administered 2023-05-01: 50 mg via INTRAVENOUS

## 2023-05-01 MED ORDER — ACETAMINOPHEN 325 MG PO TABS: 325.0000 mg | ORAL_TABLET | Freq: Four times a day (QID) | ORAL | Status: DC | PRN

## 2023-05-01 MED ORDER — CHLORHEXIDINE GLUCONATE 0.12 % MT SOLN: 15.0000 mL | Freq: Once | OROMUCOSAL | Status: AC

## 2023-05-01 MED ORDER — ONDANSETRON HCL 4 MG/2ML IJ SOLN
INTRAMUSCULAR | Status: AC
  Filled 2023-05-01: qty 2

## 2023-05-01 MED ORDER — VITAMIN C 500 MG PO TABS: 1000.0000 mg | ORAL_TABLET | Freq: Every day | ORAL | Status: DC

## 2023-05-01 MED ORDER — 0.9 % SODIUM CHLORIDE (POUR BTL) OPTIME
TOPICAL | Status: DC | PRN
  Administered 2023-05-01: 2000 mL

## 2023-05-01 MED ORDER — LIDOCAINE 2% (20 MG/ML) 5 ML SYRINGE
INTRAMUSCULAR | Status: DC | PRN
  Administered 2023-05-01: 60 mg via INTRAVENOUS

## 2023-05-01 MED ORDER — PHENOL 1.4 % MT LIQD
1.0000 | OROMUCOSAL | Status: DC | PRN
  Filled 2023-05-01: qty 177

## 2023-05-01 MED ORDER — CEFAZOLIN SODIUM-DEXTROSE 2-4 GM/100ML-% IV SOLN: 2.0000 g | Freq: Three times a day (TID) | INTRAVENOUS | Status: DC

## 2023-05-01 MED ORDER — ZINC SULFATE 220 (50 ZN) MG PO CAPS
220.0000 mg | ORAL_CAPSULE | Freq: Every day | ORAL | Status: DC
  Administered 2023-05-02: 220 mg
  Filled 2023-05-01: qty 1

## 2023-05-01 MED ORDER — DOCUSATE SODIUM 50 MG/5ML PO LIQD
100.0000 mg | Freq: Two times a day (BID) | ORAL | Status: DC
  Administered 2023-05-01: 100 mg
  Filled 2023-05-01: qty 10

## 2023-05-01 MED ORDER — TRANEXAMIC ACID-NACL 1000-0.7 MG/100ML-% IV SOLN
INTRAVENOUS | Status: AC
  Filled 2023-05-01: qty 100

## 2023-05-01 MED ORDER — DOCUSATE SODIUM 50 MG/5ML PO LIQD: 100.0000 mg | Freq: Two times a day (BID) | ORAL | Status: DC

## 2023-05-01 MED ORDER — LABETALOL HCL 5 MG/ML IV SOLN
10.0000 mg | INTRAVENOUS | Status: DC | PRN
  Administered 2023-05-01: 10 mg via INTRAVENOUS
  Filled 2023-05-01: qty 4

## 2023-05-01 MED ORDER — HYDRALAZINE HCL 20 MG/ML IJ SOLN: 5.0000 mg | INTRAMUSCULAR | Status: DC | PRN

## 2023-05-01 MED ORDER — PHENYLEPHRINE 80 MCG/ML (10ML) SYRINGE FOR IV PUSH (FOR BLOOD PRESSURE SUPPORT)
PREFILLED_SYRINGE | INTRAVENOUS | Status: DC | PRN
  Administered 2023-05-01: 80 ug via INTRAVENOUS

## 2023-05-01 MED ORDER — HYDRALAZINE HCL 20 MG/ML IJ SOLN
5.0000 mg | INTRAMUSCULAR | Status: DC | PRN
  Administered 2023-05-01: 5 mg via INTRAVENOUS
  Filled 2023-05-01: qty 1

## 2023-05-01 MED ORDER — LABETALOL HCL 5 MG/ML IV SOLN: 10.0000 mg | INTRAVENOUS | Status: DC | PRN

## 2023-05-01 MED ORDER — TRANEXAMIC ACID-NACL 1000-0.7 MG/100ML-% IV SOLN
INTRAVENOUS | Status: AC
  Filled 2023-05-01: qty 200

## 2023-05-01 SURGICAL SUPPLY — 75 items
AGENT HMST KT MTR STRL THRMB (HEMOSTASIS)
APL SKNCLS STERI-STRIP NONHPOA (GAUZE/BANDAGES/DRESSINGS) ×2
APL SRG 60D 8 XTD TIP BNDBL (TIP)
BAG COUNTER SPONGE SURGICOUNT (BAG) ×2 IMPLANT
BAG SPNG CNTER NS LX DISP (BAG) ×2
BENZOIN TINCTURE PRP APPL 2/3 (GAUZE/BANDAGES/DRESSINGS) ×2 IMPLANT
CANISTER SUCT 3000ML PPV (MISCELLANEOUS) ×2 IMPLANT
CANISTER WOUNDNEG PRESSURE 500 (CANNISTER) IMPLANT
COVER SURGICAL LIGHT HANDLE (MISCELLANEOUS) ×2 IMPLANT
DRAIN JP 15F RND RADIO PRF (DRAIN) ×2 IMPLANT
DRAPE DERMATAC (DRAPES) IMPLANT
DRAPE INCISE IOBAN 66X45 STRL (DRAPES) IMPLANT
DRAPE POUCH INSTRU U-SHP 10X18 (DRAPES) ×2 IMPLANT
DRAPE SURG 17X23 STRL (DRAPES) ×8 IMPLANT
DRESSING MEPILEX FLEX 4X4 (GAUZE/BANDAGES/DRESSINGS) IMPLANT
DRESSING PREVENA PLUS CUSTOM (GAUZE/BANDAGES/DRESSINGS) IMPLANT
DRSG MEPILEX FLEX 4X4 (GAUZE/BANDAGES/DRESSINGS) ×2
DRSG PREVENA PLUS CUSTOM (GAUZE/BANDAGES/DRESSINGS) ×2
DURAPREP 26ML APPLICATOR (WOUND CARE) ×2 IMPLANT
DURASEAL APPLICATOR TIP (TIP) IMPLANT
DURASEAL SPINE SEALANT 3ML (MISCELLANEOUS) IMPLANT
ELECT BLADE 4.0 EZ CLEAN MEGAD (MISCELLANEOUS) ×2
ELECT REM PT RETURN 9FT ADLT (ELECTROSURGICAL) ×2
ELECTRODE BLDE 4.0 EZ CLN MEGD (MISCELLANEOUS) ×2 IMPLANT
ELECTRODE REM PT RTRN 9FT ADLT (ELECTROSURGICAL) ×2 IMPLANT
EVACUATOR SILICONE 100CC (DRAIN) IMPLANT
GAUZE SPONGE 4X4 12PLY STRL (GAUZE/BANDAGES/DRESSINGS) ×2 IMPLANT
GLOVE BIO SURGEON STRL SZ 6.5 (GLOVE) ×2 IMPLANT
GLOVE BIO SURGEON STRL SZ8 (GLOVE) ×2 IMPLANT
GLOVE BIOGEL PI IND STRL 8 (GLOVE) ×2 IMPLANT
GLOVE SURG ENC MOIS LTX SZ6.5 (GLOVE) ×2 IMPLANT
GOWN STRL REUS W/ TWL LRG LVL3 (GOWN DISPOSABLE) ×4 IMPLANT
GOWN STRL REUS W/ TWL XL LVL3 (GOWN DISPOSABLE) ×2 IMPLANT
GOWN STRL REUS W/TWL LRG LVL3 (GOWN DISPOSABLE) ×4
GOWN STRL REUS W/TWL XL LVL3 (GOWN DISPOSABLE) ×2
GRAFT SKIN WND SURGICLOSE M95 (Tissue) IMPLANT
HANDPIECE INTERPULSE COAX TIP (DISPOSABLE)
KIT BASIN OR (CUSTOM PROCEDURE TRAY) ×2 IMPLANT
KIT POSITION SURG JACKSON T1 (MISCELLANEOUS) ×2 IMPLANT
KIT TURNOVER KIT B (KITS) ×2 IMPLANT
MANIFOLD NEPTUNE II (INSTRUMENTS) IMPLANT
NDL HYPO 25GX1X1/2 BEV (NEEDLE) IMPLANT
NDL SPNL 18GX3.5 QUINCKE PK (NEEDLE) ×2 IMPLANT
NEEDLE HYPO 25GX1X1/2 BEV (NEEDLE) IMPLANT
NEEDLE SPNL 18GX3.5 QUINCKE PK (NEEDLE) ×2 IMPLANT
NS IRRIG 1000ML POUR BTL (IV SOLUTION) ×2 IMPLANT
PACK LAMINECTOMY ORTHO (CUSTOM PROCEDURE TRAY) ×2 IMPLANT
PACK UNIVERSAL I (CUSTOM PROCEDURE TRAY) ×2 IMPLANT
PAD ARMBOARD 7.5X6 YLW CONV (MISCELLANEOUS) ×4 IMPLANT
PATTIES SURGICAL .5 X.5 (GAUZE/BANDAGES/DRESSINGS) IMPLANT
PATTIES SURGICAL .5 X3 (DISPOSABLE) ×2 IMPLANT
SET HNDPC FAN SPRY TIP SCT (DISPOSABLE) IMPLANT
SPONGE SURGIFOAM ABS GEL 100 (HEMOSTASIS) IMPLANT
SPONGE T-LAP 18X18 ~~LOC~~+RFID (SPONGE) IMPLANT
STAPLER VISISTAT 35W (STAPLE) IMPLANT
STRIP CLOSURE SKIN 1/2X4 (GAUZE/BANDAGES/DRESSINGS) IMPLANT
SURGIFLO W/THROMBIN 8M KIT (HEMOSTASIS) IMPLANT
SUT ETHILON 2 0 PSLX (SUTURE) IMPLANT
SUT ETHILON 3 0 FSL (SUTURE) IMPLANT
SUT MNCRL AB 3-0 PS2 18 (SUTURE) ×2 IMPLANT
SUT NURALON 4 0 TR CR/8 (SUTURE) IMPLANT
SUT SILK 2 0 (SUTURE) ×2
SUT SILK 2-0 18XBRD TIE 12 (SUTURE) IMPLANT
SUT VIC AB 1 CT1 18XCR BRD 8 (SUTURE) ×2 IMPLANT
SUT VIC AB 1 CT1 8-18 (SUTURE) ×2
SUT VIC AB 1 CTX 36 (SUTURE)
SUT VIC AB 1 CTX36XBRD ANBCTR (SUTURE) IMPLANT
SUT VIC AB 2-0 CT2 18 VCP726D (SUTURE) ×2 IMPLANT
SYR BULB IRRIG 60ML STRL (SYRINGE) ×2 IMPLANT
SYR CONTROL 10ML LL (SYRINGE) IMPLANT
TIP IRRIGATION SMALL (MISCELLANEOUS) IMPLANT
TOWEL GREEN STERILE (TOWEL DISPOSABLE) ×2 IMPLANT
TOWEL GREEN STERILE FF (TOWEL DISPOSABLE) ×2 IMPLANT
WATER STERILE IRR 1000ML POUR (IV SOLUTION) ×2 IMPLANT
YANKAUER SUCT BULB TIP NO VENT (SUCTIONS) ×2 IMPLANT

## 2023-05-01 SURGICAL SUPPLY — 46 items
ADH SKN CLS APL DERMABOND .7 (GAUZE/BANDAGES/DRESSINGS) ×2
BAG COUNTER SPONGE SURGICOUNT (BAG) ×1 IMPLANT
BAG SPNG CNTER NS LX DISP (BAG)
BLADE SAW SGTL 73X25 THK (BLADE) ×1 IMPLANT
BLADE SURG 21 STRL SS (BLADE) IMPLANT
CANISTER WOUNDNEG PRESSURE 500 (CANNISTER) IMPLANT
COVER SURGICAL LIGHT HANDLE (MISCELLANEOUS) ×1 IMPLANT
DERMABOND ADVANCED .7 DNX12 (GAUZE/BANDAGES/DRESSINGS) IMPLANT
DRAPE DERMATAC (DRAPES) IMPLANT
DRAPE IMP U-DRAPE 54X76 (DRAPES) ×1 IMPLANT
DRAPE INCISE IOBAN 85X60 (DRAPES) ×2 IMPLANT
DRAPE ORTHO SPLIT 77X108 STRL (DRAPES) ×2
DRAPE SURG ORHT 6 SPLT 77X108 (DRAPES) ×2 IMPLANT
DRAPE U-SHAPE 47X51 STRL (DRAPES) ×1 IMPLANT
DURAPREP 26ML APPLICATOR (WOUND CARE) ×1 IMPLANT
ELECT BLADE 6.5 EXT (BLADE) IMPLANT
ELECT CAUTERY BLADE 6.4 (BLADE) IMPLANT
ELECT REM PT RETURN 9FT ADLT (ELECTROSURGICAL) ×1
ELECTRODE REM PT RTRN 9FT ADLT (ELECTROSURGICAL) ×1 IMPLANT
EVACUATOR 1/8 PVC DRAIN (DRAIN) IMPLANT
GAUZE PAD ABD 8X10 STRL (GAUZE/BANDAGES/DRESSINGS) ×2 IMPLANT
GAUZE SPONGE 4X4 12PLY STRL (GAUZE/BANDAGES/DRESSINGS) ×1 IMPLANT
GLOVE BIOGEL PI IND STRL 9 (GLOVE) ×1 IMPLANT
GLOVE SURG ORTHO 9.0 STRL STRW (GLOVE) ×1 IMPLANT
GOWN STRL REUS W/ TWL XL LVL3 (GOWN DISPOSABLE) ×3 IMPLANT
GOWN STRL REUS W/TWL XL LVL3 (GOWN DISPOSABLE) ×3
GRAFT SKIN WND SURGICLOSE M95 (Tissue) IMPLANT
HANDPIECE INTERPULSE COAX TIP (DISPOSABLE)
KIT BASIN OR (CUSTOM PROCEDURE TRAY) ×1 IMPLANT
KIT TURNOVER KIT B (KITS) ×1 IMPLANT
MANIFOLD NEPTUNE II (INSTRUMENTS) ×1 IMPLANT
NS IRRIG 1000ML POUR BTL (IV SOLUTION) ×1 IMPLANT
PACK TOTAL JOINT (CUSTOM PROCEDURE TRAY) ×1 IMPLANT
PACK UNIVERSAL I (CUSTOM PROCEDURE TRAY) ×1 IMPLANT
PAD ARMBOARD 7.5X6 YLW CONV (MISCELLANEOUS) ×2 IMPLANT
PREVENA INCISION MGT 90 150 (MISCELLANEOUS) IMPLANT
SET HNDPC FAN SPRY TIP SCT (DISPOSABLE) IMPLANT
SPONGE T-LAP 18X18 ~~LOC~~+RFID (SPONGE) IMPLANT
STAPLER VISISTAT 35W (STAPLE) ×1 IMPLANT
SUT ETHIBOND NAB CT1 #1 30IN (SUTURE) ×1 IMPLANT
SUT ETHILON 2 0 PSLX (SUTURE) IMPLANT
SUT VIC AB 1 CT1 27 (SUTURE) ×1
SUT VIC AB 1 CT1 27XBRD ANBCTR (SUTURE) ×1 IMPLANT
SUT VIC AB 2-0 CTB1 (SUTURE) ×1 IMPLANT
TOWEL GREEN STERILE (TOWEL DISPOSABLE) ×1 IMPLANT
TOWEL GREEN STERILE FF (TOWEL DISPOSABLE) ×1 IMPLANT

## 2023-05-01 NOTE — Interval H&P Note (Signed)
History and Physical Interval Note:  05/01/2023 7:37 AM  John Meza  has presented today for surgery, with the diagnosis of Chronic Osteomyelitis Right Hip.  The various methods of treatment have been discussed with the patient and family. After consideration of risks, benefits and other options for treatment, the patient has consented to  Procedure(s): RIGHT HIP DISARTICULATION (Right) as a surgical intervention.  The patient's history has been reviewed, patient examined, no change in status, stable for surgery.  I have reviewed the patient's chart and labs.  Questions were answered to the patient's satisfaction.     [name]

## 2023-05-01 NOTE — Transfer of Care (Signed)
Immediate Anesthesia Transfer of Care Note  Patient: Lin Glazier Garcia-Morales  Procedure(s) Performed: RIGHT HIP DISARTICULATION (Right: Hip)  Patient Location: PACU  Anesthesia Type:General  Level of Consciousness: awake, alert , oriented, and patient cooperative  Airway & Oxygen Therapy: Patient Spontanous Breathing and Patient connected to face mask oxygen  Post-op Assessment: Report given to RN and Post -op Vital signs reviewed and stable  Post vital signs: Reviewed and stable  Last Vitals:  Vitals Value Taken Time  BP 96/61 05/01/23 0947  Temp    Pulse 90 05/01/23 0954  Resp 16 05/01/23 0954  SpO2 100 % 05/01/23 0954  Vitals shown include unfiled device data.  Last Pain:  Vitals:   05/01/23 0604  TempSrc: Oral  PainSc: 0-No pain         Complications: No notable events documented.

## 2023-05-01 NOTE — Anesthesia Postprocedure Evaluation (Addendum)
Anesthesia Post Note  Patient: John Meza  Procedure(s) Performed: WOUND EXPLORATION OF HIP (Right)     Patient location during evaluation: SICU Anesthesia Type: General Level of consciousness: sedated Pain management: pain level controlled Vital Signs Assessment: post-procedure vital signs reviewed and stable Respiratory status: patient remains intubated per anesthesia plan Cardiovascular status: stable Postop Assessment: no apparent nausea or vomiting Anesthetic complications: no Comments: Taken to ICU intubated for continued monitoring of surgical site should hemostasis be lost resulting in need to return emergently to the OR. Report given at bedside to RN and RT.    No notable events documented.  Last Vitals:  Vitals:   05/01/23 1400 05/01/23 1425  BP: (!) 144/78   Pulse: 93   Resp: (!) 0   Temp:  (!) 36 C  SpO2: 100%     Last Pain:  Vitals:   05/01/23 1425  TempSrc: Axillary  PainSc:                  Jill Ruppe

## 2023-05-01 NOTE — Op Note (Signed)
05/01/2023  9:20 AM  PATIENT:  John Meza    PRE-OPERATIVE DIAGNOSIS:  Chronic Osteomyelitis Right Hip  POST-OPERATIVE DIAGNOSIS:  Same  PROCEDURE:  RIGHT HIP DISARTICULATION Debridement that extended into the retroperitoneal space. Tissue and purulence sent for cultures. Application of Kerecis micro graft 95 cm.  To cover wound surface area over 200 cm. Application Prevena customizable wound VAC sponge   SURGEON:  [name], MD  PHYSICIAN ASSISTANT: Hart Carwin ANESTHESIA:   General  PREOPERATIVE INDICATIONS:  [name] is a  62 y.o. male with a diagnosis of Chronic Osteomyelitis Right Hip who failed conservative measures and elected for surgical management.    The risks benefits and alternatives were discussed with the patient preoperatively including but not limited to the risks of infection, bleeding, nerve injury, cardiopulmonary complications, the need for revision surgery, among others, and the patient was willing to proceed.  OPERATIVE IMPLANTS:   Implant Name Type Inv. Item Serial No. Manufacturer Lot No. LRB No. Used Action  GRAFT SKIN WND SURGICLOSE M95 - [id number] Tissue GRAFT SKIN WND [employer]  [employer] [id number] Right 1 Implanted    @ENCIMAGES @  OPERATIVE FINDINGS: Patient had a massive purulent abscess that extended into the retroperitoneal space.  Tissue margins were clear at time of closure and tissue cultures were sent.  Would continue IV antibiotics for at least 6 weeks based on culture sensitivities.  Infectious disease to determine if he would need chronic suppressive antibiotics.  OPERATIVE PROCEDURE: Patient brought the operating room and underwent a general anesthetic.  After adequate levels anesthesia were obtained patient's right lower extremity was prepped using DuraPrep draped into a sterile field a timeout was called.  A teardrop incision was made centered on the femoral vessels.  Dissection was  carried down to the femoral vessels these were clamped and suture-ligated with 2-0 silk.  The amputation was completed there was complete destruction of the proximal femur.  There was a large purulent abscess that extended into multiple pockets.  These pockets were debrided and soft tissue was resected back to healthy viable margins.  The abscess extended over the iliac crest into the retroperitoneal space.  This was also debrided and irrigated with normal saline after hemostasis was obtained.  The leg was delivered off the field bone margins were grossly clear.  The wound bed was filled with 95 cm of Kerecis micro graft to cover a wound surface area 400 cm.  The incision was closed using 2-0 nylon a Prevena customizable wound VAC was applied this had a good suction fit patient was extubated taken the PACU in stable condition.   DISCHARGE PLANNING:  Antibiotic duration: Anticipate at least 6 weeks of IV antibiotics patient most likely will need chronic suppressive antibiotics.  Weightbearing: Not applicable  Pain medication: Opioid pathway  Dressing care/ Wound VAC: Wound VAC for 1 week On therapy recommendations. Ambulatory devices: Walker or crutches as needed  Discharge to: Discharge planning based  Follow-up: In the office 1 week post operative.

## 2023-05-01 NOTE — Anesthesia Preprocedure Evaluation (Signed)
Anesthesia Evaluation  Patient identified by MRN, date of birth, ID band Patient awake    Reviewed: Allergy & Precautions, NPO status , Patient's Chart, lab work & pertinent test results  History of Anesthesia Complications Negative for: history of anesthetic complications  Airway Mallampati: III  TM Distance: >3 FB Neck ROM: Full    Dental  (+) Teeth Intact, Dental Advisory Given   Pulmonary neg shortness of breath, neg sleep apnea, neg COPD, neg recent URI, former smoker   breath sounds clear to auscultation       Cardiovascular hypertension, Pt. on medications (-) angina (-) Past MI and (-) CHF  Rhythm:Regular   1. Left ventricular ejection fraction, by estimation, is 65 to 70%. The  left ventricle has normal function.   2. Right ventricular systolic function is normal. The right ventricular  size is normal.   3. No left atrial/left atrial appendage thrombus was detected.   4. The mitral valve is normal in structure. Mild mitral valve  regurgitation. No evidence of mitral stenosis.   5. The aortic valve is normal in structure. Aortic valve regurgitation is  trivial.     Neuro/Psych neg Seizures Paraplegia, wheelchair mobile, neurogenic bladder with foley  Neuromuscular disease  negative psych ROS   GI/Hepatic negative GI ROS, Neg liver ROS,,,  Endo/Other  diabetes    Renal/GU Lab Results      Component                Value               Date                      CREATININE               0.79                04/30/2023            Lab Results      Component                Value               Date                      NA                       132 (L)             04/30/2023                K                        3.5                 04/30/2023                CO2                      22                  04/30/2023                GLUCOSE                  198 (H)             04/30/2023  BUN                       26 (H)              04/30/2023                CREATININE               0.79                04/30/2023                CALCIUM                  8.4 (L)             04/30/2023                EGFR                     102                 01/27/2023                GFRNONAA                 >60                 04/30/2023                Musculoskeletal  (+) Arthritis ,    Abdominal   Peds  Hematology  (+) Blood dyscrasia, anemia Lab Results      Component                Value               Date                      WBC                      14.8 (H)            04/30/2023                HGB                      9.4 (L)             04/30/2023                HCT                      28.8 (L)            04/30/2023                MCV                      87.5                04/30/2023                PLT                      457 (H)             04/30/2023              Anesthesia Other Findings   Reproductive/Obstetrics  Anesthesia Physical Anesthesia Plan  ASA: 4  Anesthesia Plan: General   Post-op Pain Management: Ofirmev IV (intra-op)*   Induction: Intravenous  PONV Risk Score and Plan: 2 and Ondansetron and Dexamethasone  Airway Management Planned: Oral ETT  Additional Equipment:   Intra-op Plan:   Post-operative Plan: Possible Post-op intubation/ventilation  Informed Consent: I have reviewed the patients History and Physical, chart, labs and discussed the procedure including the risks, benefits and alternatives for the proposed anesthesia with the patient or authorized representative who has indicated his/her understanding and acceptance.     Dental advisory given and Interpreter used for interveiw  Plan Discussed with: CRNA  Anesthesia Plan Comments: (Type and cross 2 units pRBC, possible a line)         Anesthesia Quick Evaluation

## 2023-05-01 NOTE — Consult Note (Signed)
NAME:  John Meza, MRN:  [id number], DOB:  1961/04/30, LOS: 8 ADMISSION DATE:  04/23/2023, CONSULTATION DATE:  05/01/2023 REFERRING MD:  Dr, Idelle Leech, CHIEF COMPLAINT:  Hypotension with need of pressor support    History of Present Illness:  John Meza is a 62 y.o. 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  Workup thus far during admission included CT abdomen and pelvis which revealed septic arthritis of right hip.    Dr. Lajoyce Corners consulted and performed right hip disarticulation 7/20 course complicated by hemorrhagic shock resulting in multiple transfusions.  Patient was intubated postprocedure and PCCM consult for further management.  Pertinent  Medical History   spinal cord injury 2013, HTN, HLD, diabetes, recurrent ESBL UTIs with history of neurogenic bladder managed by self cath, and type 2 diabetes  Significant Hospital Events: Including procedures, antibiotic start and stop dates in addition to other pertinent events   7/12 presented septic found to have septic arthritis of right hip 7/20 right hip disarticulation per Dr. Due to with hemorrhagic shock IntraOp.  Left intubated postprocedure  Interim History / Subjective:  As above  Objective   Blood pressure (!) 72/62, pulse (!) 133, temperature (!) 97.5 F (36.4 C), temperature source Temporal, resp. rate 20, height 5' 4.02" (1.626 m), weight 87 kg, SpO2 100%.        Intake/Output Summary (Last 24 hours) at 05/01/2023 1356 Last data filed at 05/01/2023 1344 Gross per 24 hour  Intake 7125 ml  Output 3600 ml  Net 3525 ml   Filed Weights   05/01/23 0709  Weight: 87 kg    Examination: General: Acute on chronic ill-appearing deconditioned male lying in bed in no acute  distress HEENT: ETT, MM pink/moist, PERRL,  Neuro: Sedated on ventilator CV: s1s2 regular rate and rhythm, no murmur, rubs, or gallops,  PULM: Clear to auscultation bilaterally, no increased work of breathing, no added breath sounds GI: soft, bowel sounds active in all 4 quadrants, non-tender, non-distended Extremities: warm/dry, no edema  Skin: no rashes or lesions   Resolved Hospital Problem list     Assessment & Plan:  Hemorrhagic shock -Post right hip disarticulation patient displayed signs of active bleed resulting in return to our where large hematoma was evacuated. -In OR it appears patient received 2 PRBCs, 4 FFP, 1 cryo, and 1 platelet transfusion P: On admission to ICU patient is hemodynamically stable off pressors Trend CBC Manage transfusions moving forward if patient were to rebleed Monitor output from wound VAC  Postoperative ventilator management -Given hemorrhagic shock intraoperative patient was left intubated postprocedure P: Hemodynamically improved, wean sedation with likely ability to extubate Continue ventilator support with lung protective strategies  Wean PEEP and FiO2 for sats greater than 90%. Head of bed elevated 30 degrees. Plateau pressures less than 30 cm H20.  Follow intermittent chest x-ray and ABG.   SAT/SBT as tolerated, mentation preclude extubation  Ensure adequate pulmonary hygiene  Follow cultures  VAP bundle in place  PAD protocol  Septic arthritis due to ESBL E. coli right hip now s/p right hip disarticulation -Presented with leukocytosis, fever, tachycardia, dyspnea, lactic acidosis, CT abdomen/pelvis with large collection of right hip Recurrent complicated UTI with history of ESBL E. Coli In the setting of neurogenic bladder requiring self cath P:  Primary management per orthopedic ID following, appreciate assistance Continue meropenem per ID Supportive care PT/OT as able Continue Foley catheter  Type 2 diabetes P: Continue  SSI CBG goal 140-180 CBG checks every 4  Paraplegia secondary to spinal cord injury Chronic right buttocks pressure ulcer P: Supportive care Pressure relieving devices  Essential hypertension Hyperlipidemia P: Hold oral agents given recent hemorrhagic shock, resume when able Continue home statin Continuous telemetry  Hyponatremia Hypophosphatemia Hypomagnesemia P: Trend CBC Supplement as able  Normocytic and iron deficiency anemia superimposed on acute blood loss anemia P: Trend CBC Transfuse per protocol Hemoglobin goal greater than 7 Continue iron supplementation  Hypoalbuminemia P: Encourage oral intake if remains intubated start tube feeds  Best Practice (right click and "Reselect all SmartList Selections" daily)   Diet/type: NPO DVT prophylaxis: SCD GI prophylaxis: PPI Lines: N/A Foley:  Yes, and it is still needed Code Status:  full code Last date of multidisciplinary goals of care discussion: Pending   Labs   CBC: Recent Labs  Lab 04/26/23 0257 04/27/23 0008 04/28/23 0005 04/29/23 0131 04/30/23 1445 04/30/23 2232 05/01/23 1148 05/01/23 1301 05/01/23 1326  WBC 13.4* 16.2* 13.8* 12.1* 14.8*  --   --   --   --   NEUTROABS  --   --   --   --  11.9*  --   --   --   --   HGB 7.7* 7.5* 7.1* 7.2* 8.2* 9.4* 5.1* 11.2* 10.2*  HCT 23.4* 23.4* 22.1* 22.6* 25.1* 28.8* 15.0* 33.0* 30.0*  MCV 84.8 86.7 86.3 85.9 87.5  --   --   --   --   PLT 422* 431* 407* 428* 457*  --   --   --   --     Basic Metabolic Panel: Recent Labs  Lab 04/26/23 0257 04/27/23 0008 04/28/23 0005 04/29/23 0131 04/30/23 1445 05/01/23 1148 05/01/23 1301 05/01/23 1326  NA 127* 126* 127* 128* 132* 136 136 137  K 3.8 3.9 4.0 3.8 3.5 4.2 4.2 3.9  CL 97* 100 102 101 103  --   --   --   CO2 23 21* 19* 19* 22  --   --   --   GLUCOSE 220* 210* 252* 215* 198*  --   --   --   BUN 27* 32* 26* 31* 26*  --   --   --   CREATININE 0.79 0.70 0.75 0.89 0.79  --   --   --   CALCIUM 7.8*  7.6* 7.7* 8.0* 8.4*  --   --   --   MG  --   --   --  1.4* 1.5*  --   --   --   PHOS  --   --   --   --  2.2*  --   --   --    GFR: Estimated Creatinine Clearance: 98.9 mL/min (by C-G formula based on SCr of 0.79 mg/dL). Recent Labs  Lab 04/27/23 0008 04/28/23 0005 04/29/23 0131 04/30/23 1445  WBC 16.2* 13.8* 12.1* 14.8*    Liver Function Tests: Recent Labs  Lab 04/30/23 1445  AST 43*  ALT 58*  ALKPHOS 96  BILITOT 0.2*  PROT 5.2*  ALBUMIN 1.6*   No results for input(s): "LIPASE", "AMYLASE" in the last 168 hours. No results for input(s): "AMMONIA" in the last 168 hours.  ABG    Component Value Date/Time   PHART 7.379 05/01/2023 1326   PCO2ART 30.1 (L) 05/01/2023 1326  PO2ART 297 (H) 05/01/2023 1326   HCO3 18.0 (L) 05/01/2023 1326   TCO2 19 (L) 05/01/2023 1326   ACIDBASEDEF 7.0 (H) 05/01/2023 1326   O2SAT 100 05/01/2023 1326     Coagulation Profile: No results for input(s): "INR", "PROTIME" in the last 168 hours.  Cardiac Enzymes: No results for input(s): "CKTOTAL", "CKMB", "CKMBINDEX", "TROPONINI" in the last 168 hours.  HbA1C: HbA1c, POC (controlled diabetic range)  Date/Time Value Ref Range Status  07/23/2022 09:42 AM 5.7 0.0 - 7.0 % Final  11/03/2021 10:08 AM 8.0 (A) 0.0 - 7.0 % Final   Hgb A1c MFr Bld  Date/Time Value Ref Range Status  04/12/2023 04:30 AM 7.0 (H) 4.8 - 5.6 % Final    Comment:    (NOTE)         Prediabetes: 5.7 - 6.4         Diabetes: >6.4         Glycemic control for adults with diabetes: <7.0   10/28/2022 02:03 PM 6.5 (H) 4.8 - 5.6 % Final    Comment:             Prediabetes: 5.7 - 6.4          Diabetes: >6.4          Glycemic control for adults with diabetes: <7.0     CBG: Recent Labs  Lab 04/30/23 1635 04/30/23 2142 05/01/23 0622 05/01/23 0853 05/01/23 0949  GLUCAP 163* 180* 174* 172* 190*    Review of Systems:   Unable to assess   Past Medical History:  He,  has a past medical history of Cellulitis and  abscess of buttock (10/2016), Diabetes mellitus, ESBL (extended spectrum beta-lactamase) producing bacteria infection (03/16/2022), Hyperkalemia (03/16/2022), Hypertension, Myositis (04/21/2022), Paraplegia (HCC) (2013), Pressure ulcer, buttock, right, unstageable (HCC) (12/30/2020), Pyelonephritis due to Escherichia coli (03/16/2022), Sacral decubitus ulcer, stage IV (HCC) (11/03/2021), SCI (spinal cord injury), Sepsis due to Escherichia coli (E. coli) (HCC) (03/01/2022), and Spine fracture (11/16/2011).   Surgical History:   Past Surgical History:  Procedure Laterality Date   CHOLECYSTECTOMY N/A 05/01/2015   Procedure: LAPAROSCOPIC CHOLECYSTECTOMY WITH ATTEMPTED INTRAOPERATIVE CHOLANGIOGRAM;  Surgeon: Violeta Gelinas, MD;  Location: [facility] OR;  Service: General;  Laterality: N/A;   IR US GUIDE BX ASP/DRAIN  04/27/2023   SPINE SURGERY     TEE WITHOUT CARDIOVERSION N/A 03/26/2022   Procedure: TRANSESOPHAGEAL ECHOCARDIOGRAM (TEE);  Surgeon: Elease Hashimoto Deloris Ping, MD;  Location: [facility] ENDOSCOPY;  Service: Cardiovascular;  Laterality: N/A;     Social History:   reports that he quit smoking about 11 years ago. His smoking use included cigarettes. He started smoking about 16 years ago. He has a 5 pack-year smoking history. He has never used smokeless tobacco. He reports that he does not currently use alcohol after a past usage of about 1.0 standard drink of alcohol per week. He reports that he does not use drugs.   Family History:  His family history includes Diabetes in his father and sister; Healthy in his mother.   Allergies Allergies  Allergen Reactions   Macrobid [Nitrofurantoin] Itching and Rash     Home Medications  Prior to Admission medications   Medication Sig Start Date End Date Taking? Authorizing Provider  atorvastatin (LIPITOR) 20 MG tablet Take 1 tablet (20 mg total) by mouth daily at 12 noon. 12/01/22  Yes Storm Frisk, MD  famotidine (PEPCID) 20 MG tablet Take 1 tablet (20 mg total) by mouth 2  (two) times daily. 12/01/22  Yes [name],  Charlcie Cradle, MD  ferrous sulfate 325 (65 FE) MG tablet Take 1 tablet (325 mg total) by mouth daily. 12/01/22  Yes Storm Frisk, MD  metFORMIN (GLUCOPHAGE) 1000 MG tablet Take 1 tablet (1,000 mg total) by mouth 2 (two) times daily with a meal. 12/01/22  Yes Storm Frisk, MD  sitaGLIPtin (JANUVIA) 100 MG tablet Take 1 tablet (100 mg total) by mouth daily. 12/01/22  Yes Storm Frisk, MD  valsartan-hydrochlorothiazide (DIOVAN-HCT) 320-25 MG tablet Take 1 tablet by mouth daily. 12/01/22  Yes Storm Frisk, MD  Vitamin D, Ergocalciferol, (DRISDOL) 1.25 MG (50000 UNIT) CAPS capsule Take 1 capsule (50,000 Units total) by mouth every 7 (seven) days. Please get updated vit D level. 03/03/23   Storm Frisk, MD  loratadine (CLARITIN) 10 MG tablet Take 1 tablet (10 mg total) by mouth daily. As needed for itchy throat/allergy symptoms Patient not taking: Reported on 03/24/2019 12/26/18 07/05/20  Cain Saupe, MD     Critical care time:   CRITICAL CARE Performed by: Arville Postlewaite D. Harris  Total critical care time: 42 minutes  Critical care time was exclusive of separately billable procedures and treating other patients.  Critical care was necessary to treat or prevent imminent or life-threatening deterioration.  Critical care was time spent personally by me on the following activities: development of treatment plan with patient and/or surrogate as well as nursing, discussions with consultants, evaluation of patient's response to treatment, examination of patient, obtaining history from patient or surrogate, ordering and performing treatments and interventions, ordering and review of laboratory studies, ordering and review of radiographic studies, pulse oximetry and re-evaluation of patient's condition.  Deanna Boehlke D. Harris, NP-C [facility] Personal contact information can be found on Amion  If no contact or response made please call  [phone] 05/01/2023, 2:56 PM

## 2023-05-01 NOTE — Op Note (Addendum)
04/23/2023 - 05/01/2023  1:42 PM  PATIENT:  John Meza    PRE-OPERATIVE DIAGNOSIS:  POST HIP disarticulation  POST-OPERATIVE DIAGNOSIS: Hematoma with no active bleeding.  Hematoma secondary to oozing from the surface area.  PROCEDURE:  WOUND EXPLORATION OF HIP with excisional debridement of skin and soft tissue muscle and fascia. Application Kerecis micro graft 95 cm x 2. Application of customizable wound VAC.  SURGEON:  [name], MD  PHYSICIAN ASSISTANT: Hart Carwin ANESTHESIA:   General  PREOPERATIVE INDICATIONS:  John Meza is a  62 y.o. male with a diagnosis of POST HIP EXPLORATION who failed conservative measures and elected for surgical management.    The risks benefits and alternatives were discussed with the patient preoperatively including but not limited to the risks of infection, bleeding, nerve injury, cardiopulmonary complications, the need for revision surgery, among others, and the patient was willing to proceed.  OPERATIVE IMPLANTS:   * No implants in log *  @ENCIMAGES @  OPERATIVE FINDINGS: Patient was taken back to the operating room for acute blood loss anemia while in the recovery room.  When patient left the OR the wound VAC was functioning well and there was no drainage.  While in the operating room patient started developing drainage into the wound VAC canister of clear serosanguineous fluid.  There was no bright red blood.  Patient was emergently taken back to the operating room for wound exploration.  OPERATIVE PROCEDURE: Patient was emergently taken back to the operating room.  The right lower extremity was prepped using Betadine paint and draped into a sterile field a timeout was called.  The sutures were removed and there was a large hematoma that was evacuated.  There was no active bleeding from the femoral vessels.  There was no arterial bleeding from any vessels.  Patient had generalized oozing from the surface area of  the wound.  This appears secondary to after decompression of the large abscess that patient had revascularization and dilation of the microscopic vessels along the surface area and this was the source of the acute blood loss anemia.  Electrocautery was used to coagulate the oozing from the surface area.  This required multiple rounds of electrocautery to the surface areas multiple times to provide hemostasis.  The femoral vessels were well tied off with no oozing.  There was no arterial bleeding.  There was no bleeding from within the retroperitoneal area that was previously filled with abscess.  After meticulous electrocautery of all surface areas the wound was filled with TXA this was allowed to soak the wound was irrigated and the wound was then filled with Kerecis micro graft 95 cm x 2.  The wound surfaces were all clean and dry.  The incision was closed using 2-0 nylon.  A customizable wound VAC was applied this had no drainage.  Patient will remain intubated anticipate discharge to the ICU.   DISCHARGE PLANNING:  Antibiotic duration: Continue IV antibiotics adjust according to culture sensitivities.  Patient will need prolonged IV antibiotics.  Weightbearing: No restrictions  Pain medication: Opioid pathway  Dressing care/ Wound VAC: Wound VAC  Discharge to the ICU in stable condition.

## 2023-05-01 NOTE — Anesthesia Procedure Notes (Signed)
Procedure Name: Intubation Date/Time: 05/01/2023 7:57 AM  Performed by: Wilder Glade, CRNAPre-anesthesia Checklist: Patient identified, Emergency Drugs available, Suction available, Patient being monitored and Timeout performed Patient Re-evaluated:Patient Re-evaluated prior to induction Oxygen Delivery Method: Circle system utilized Preoxygenation: Pre-oxygenation with 100% oxygen Induction Type: IV induction Ventilation: Mask ventilation without difficulty Laryngoscope Size: Miller and 2 Grade View: Grade I Tube type: Oral Tube size: 7.5 mm Number of attempts: 1 Airway Equipment and Method: Stylet Placement Confirmation: ETT inserted through vocal cords under direct vision, positive ETCO2 and breath sounds checked- equal and bilateral Secured at: 22 cm Tube secured with: Tape Dental Injury: Teeth and Oropharynx as per pre-operative assessment  Comments: Brief atraumatic dentition unchanged

## 2023-05-01 NOTE — Progress Notes (Signed)
PROGRESS NOTE    John Meza  EAV:[id number] DOB: 1961-01-24 DOA: 04/23/2023 PCP: Storm Frisk, MD   Brief Narrative:  This 62 year old male with history of paraplegia, neurogenic bladder requiring self cath, recurrent ESBL UTI, diabetes type 2, hypertension, chronic right buttock pressure ulcer who presented with fever. He was recently hospitalized here in June for complicated UTI due to Klebsiella pneumonia, Morganella morganii and was discharged on Cipro. Report of fever with nausea and vomiting. On presentation, lab work showed WBC count of 26,000, lactate of 3. UA suspicious for pyuria. Started on Merem. Continued to be febrile intermittently. Urine culture has not shown any growth. CT abdomen/pelvis showed large collection around the right hip. IR consulted for drain. Orthopedics also consulted. Patient underwent Right hip disarticulation today.  Surgical course complicated by hemorrhagic shock requiring multiple units of blood transfusion and subsequent intubation.  Assessment & Plan:   Principal Problem:   Sepsis (HCC) Active Problems:   Controlled type 2 diabetes mellitus (HCC)   Paraplegia (HCC)   Hyponatremia   Essential hypertension   Complicated UTI (urinary tract infection)   Normocytic anemia   Hyperlipidemia associated with type 2 diabetes mellitus (HCC)   Leukocytosis   Fever of unknown origin   Septic hip (HCC)   Osteomyelitis of right hip (HCC)   Abscess of right hip   Pelvic abscess in male [facility])   Hematoma of hip, initial encounter  Septic arthritis Right hip: -Presented with leukocytosis, fever, tachycardia, dyspnea.  Elevated lactate.  Blood cultures not showing any growth.   -Suspected UTI but urine culture did not show any growth.  Started on [place] .Procalcitonin found to be elevated.   -Continued to spike fever intermittently.. Chest x-ray did not show pneumonia. >>CT abdomen/pelvis showed large collection on the right hip.  Right hip  area also found to be edematous.   -Patient is paraplegic and does not have any sensation she did not complain of any pain. -Orthopedics consulted>>appreciate Dr. Lajoyce Corners -aspirate culture = ESBL E coli  -ID consulted>>continue merrem, d/c vanco -Dr. Duda>>hip disarticulation--planned 7/20 Patient underwent Right hip disarticulation today.Surgical course complicated by hemorrhagic shock requiring multiple units of blood transfusion and subsequent intubation.   Recurrent complicated UTI: History of ESBL E. coli UTI in February 2021, treated for Klebsiella/Morganella UTI a month ago.   Patient has neurogenic bladder, requires self cath.   Currently on Foley cath.  Plan from previous hospitalist to potentially discharge with indwelling foley.  -7/12 Urine culture did not show any growth   Nausea/vomiting: Was on ciprofloxacin on discharge from here which could have contributed.  Continue antiemetics.  -Resolved--tolerating diet   Diabetes type 2: -04/12/23 A1c of 7.  On Glucophage, Januvia at home.   -Currently on sliding scale - basal added   Hypertension:  -HCTZ discontinued for hyponatremia -arb on hold as well   Hyperlipidemia: Continue Lipitor   Paraplegia: On wheelchair-bound.  Has neurogenic bladder.     Chronic right buttock pressure ulcer: Wound care following.     Hyponatremia: Looks like this is a chronic problem: On reviewing his previous lab works, he is sodium has been in the range of 120s.   Discontinue hydrochlorothiazide.  Hold ARB as well.  Monitor.   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  Surgical course  complicated by hemorrhagic shock requiring multiple units of blood transfusion and subsequent intubation   Consultants:  ID  Ortho  Procedures: None  Antimicrobials:  Anti-infectives (From admission, onward)    Start     Dose/Rate Route Frequency Ordered Stop   05/01/23 1515  ceFAZolin (ANCEF) IVPB 2g/100 mL premix  Status:  Discontinued        2 g 200 mL/hr over 30 Minutes Intravenous Every 8 hours 05/01/23 1424 05/01/23 1445   05/01/23 0704  ceFAZolin (ANCEF) 2-4 GM/100ML-% IVPB       Note to Pharmacy: Kathrene Bongo D: cabinet override      05/01/23 0704 05/01/23 1914   05/01/23 0700  ceFAZolin (ANCEF) IVPB 2g/100 mL premix  Status:  Discontinued        2 g 200 mL/hr over 30 Minutes Intravenous To ShortStay Surgical 04/30/23 2134 05/01/23 1348   04/30/23 1300  ceFAZolin (ANCEF) IVPB 2g/100 mL premix  Status:  Discontinued        2 g 200 mL/hr over 30 Minutes Intravenous To ShortStay Surgical 04/29/23 2039 04/30/23 2137   04/29/23 1400  meropenem (MERREM) 1 g in sodium chloride 0.9 % 100 mL IVPB        1 g 200 mL/hr over 30 Minutes Intravenous Every 8 hours 04/29/23 0832     04/28/23 1400  meropenem (MERREM) 2 g in sodium chloride 0.9 % 100 mL IVPB  Status:  Discontinued        2 g 280 mL/hr over 30 Minutes Intravenous Every 8 hours 04/28/23 1305 04/29/23 0832   04/27/23 1800  vancomycin (VANCOREADY) IVPB 1500 mg/300 mL  Status:  Discontinued        1,500 mg 150 mL/hr over 120 Minutes Intravenous Every 24 hours 04/26/23 1656 04/28/23 0934   04/26/23 1730  vancomycin (VANCOREADY) IVPB 1750 mg/350 mL        1,750 mg 175 mL/hr over 120 Minutes Intravenous  Once 04/26/23 1656 04/26/23 1922   04/24/23 1100  meropenem (MERREM) 1 g in sodium chloride 0.9 % 100 mL IVPB  Status:  Discontinued        1 g 200 mL/hr over 30 Minutes Intravenous Every 8 hours 04/24/23 0953 04/28/23 1305   04/23/23 0445  ceFEPIme (MAXIPIME) 2 g in sodium chloride 0.9 % 100 mL IVPB  Status:  Discontinued         2 g 200 mL/hr over 30 Minutes Intravenous  Once 04/23/23 0438 04/23/23 0443   04/23/23 0445  meropenem (MERREM) 1 g in sodium chloride 0.9 % 100 mL IVPB        1 g 200 mL/hr over 30 Minutes Intravenous  Once 04/23/23 0443 04/23/23 0550      Subjective: Patient was seen and examined at bed side. Overnight event noted,  Patient underwent Right hip disarticulation today.  Surgical course complicated by hemorrhagic shock requiring multiple units of blood transfusion and subsequent intubation.  Objective: Vitals:   05/01/23 1400 05/01/23 1425 05/01/23 1447 05/01/23 1503  BP: (!) 144/78   (!) 165/64  Pulse: 93  94   Resp: (!) 0  (!) 9   Temp:  (!) 96.8 F (36 C) 97.9 F (36.6 C)   TempSrc:  Axillary Axillary   SpO2: 100%  100%   Weight:   72.4 kg   Height:  Intake/Output Summary (Last 24 hours) at 05/01/2023 1518 Last data filed at 05/01/2023 1430 Gross per 24 hour  Intake [address] ml  Output 5350 ml  Net 5075 ml   Filed Weights   05/01/23 0709 05/01/23 1447  Weight: 87 kg 72.4 kg    Examination:  General exam: Appears calm and comfortable, intubated. Respiratory system: Clear to auscultation. Respiratory effort normal. RR16 Cardiovascular system: S1 & S2 heard, RRR.  No pedal edema. Gastrointestinal system: Abdomen is nondistended, soft and nontender. Normal bowel sounds heard. Central nervous system: Intubated. Not assessed. Extremities: RLE swelling Skin: No rashes, lesions or ulcers Psychiatry: intubated.   Data Reviewed: I have personally reviewed following labs and imaging studies  CBC: Recent Labs  Lab 04/26/23 0257 04/27/23 0008 04/28/23 0005 04/29/23 0131 04/30/23 1445 04/30/23 2232 05/01/23 1148 05/01/23 1301 05/01/23 1326  WBC 13.4* 16.2* 13.8* 12.1* 14.8*  --   --   --   --   NEUTROABS  --   --   --   --  11.9*  --   --   --   --   HGB 7.7* 7.5* 7.1* 7.2* 8.2* 9.4* 5.1* 11.2* 10.2*  HCT 23.4* 23.4* 22.1* 22.6* 25.1* 28.8* 15.0* 33.0*  30.0*  MCV 84.8 86.7 86.3 85.9 87.5  --   --   --   --   PLT 422* 431* 407* 428* 457*  --   --   --   --    Basic Metabolic Panel: Recent Labs  Lab 04/26/23 0257 04/27/23 0008 04/28/23 0005 04/29/23 0131 04/30/23 1445 05/01/23 1148 05/01/23 1301 05/01/23 1326  NA 127* 126* 127* 128* 132* 136 136 137  K 3.8 3.9 4.0 3.8 3.5 4.2 4.2 3.9  CL 97* 100 102 101 103  --   --   --   CO2 23 21* 19* 19* 22  --   --   --   GLUCOSE 220* 210* 252* 215* 198*  --   --   --   BUN 27* 32* 26* 31* 26*  --   --   --   CREATININE 0.79 0.70 0.75 0.89 0.79  --   --   --   CALCIUM 7.8* 7.6* 7.7* 8.0* 8.4*  --   --   --   MG  --   --   --  1.4* 1.5*  --   --   --   PHOS  --   --   --   --  2.2*  --   --   --    GFR: Estimated Creatinine Clearance: 90.7 mL/min (by C-G formula based on SCr of 0.79 mg/dL). Liver Function Tests: Recent Labs  Lab 04/30/23 1445  AST 43*  ALT 58*  ALKPHOS 96  BILITOT 0.2*  PROT 5.2*  ALBUMIN 1.6*   No results for input(s): "LIPASE", "AMYLASE" in the last 168 hours. No results for input(s): "AMMONIA" in the last 168 hours. Coagulation Profile: No results for input(s): "INR", "PROTIME" in the last 168 hours. Cardiac Enzymes: No results for input(s): "CKTOTAL", "CKMB", "CKMBINDEX", "TROPONINI" in the last 168 hours. BNP (last 3 results) No results for input(s): "PROBNP" in the last 8760 hours. HbA1C: No results for input(s): "HGBA1C" in the last 72 hours. CBG: Recent Labs  Lab 04/30/23 2142 05/01/23 0622 05/01/23 0853 05/01/23 0949 05/01/23 1419  GLUCAP 180* 174* 172* 190* 247*   Lipid Profile: No results for input(s): "CHOL", "HDL", "LDLCALC", "TRIG", "CHOLHDL", "LDLDIRECT" in the last 72 hours. Thyroid  Function Tests: No results for input(s): "TSH", "T4TOTAL", "FREET4", "T3FREE", "THYROIDAB" in the last 72 hours. Anemia Panel: Recent Labs    04/29/23 0131 04/30/23 1445  VITAMINB12 698  --   FOLATE  --  9.5  FERRITIN 483*  --   TIBC NOT CALCULATED   --   IRON 15*  --    Sepsis Labs: No results for input(s): "PROCALCITON", "LATICACIDVEN" in the last 168 hours.  Recent Results (from the past 240 hour(s))  Resp panel by RT-PCR (RSV, Flu A&B, Covid) Anterior Nasal Swab     Status: None   Collection Time: 04/23/23  4:37 AM   Specimen: Anterior Nasal Swab  Result Value Ref Range Status   SARS Coronavirus 2 by RT PCR NEGATIVE NEGATIVE Final   Influenza A by PCR NEGATIVE NEGATIVE Final   Influenza B by PCR NEGATIVE NEGATIVE Final    Comment: (NOTE) The Xpert Xpress SARS-CoV-2/FLU/RSV plus assay is intended as an aid in the diagnosis of influenza from Nasopharyngeal swab specimens and should not be used as a sole basis for treatment. Nasal washings and aspirates are unacceptable for Xpert Xpress SARS-CoV-2/FLU/RSV testing.  Fact Sheet for Patients: BloggerCourse.com  Fact Sheet for Healthcare Providers: SeriousBroker.it  This test is not yet approved or cleared by the [place] FDA and has been authorized for detection and/or diagnosis of SARS-CoV-2 by FDA under an Emergency Use Authorization (EUA). This EUA will remain in effect (meaning this test can be used) for the duration of the COVID-19 declaration under Section 564(b)(1) of the Act, 21 U.S.C. section 360bbb-3(b)(1), unless the authorization is terminated or revoked.     Resp Syncytial Virus by PCR NEGATIVE NEGATIVE Final    Comment: (NOTE) Fact Sheet for Patients: BloggerCourse.com  Fact Sheet for Healthcare Providers: SeriousBroker.it  This test is not yet approved or cleared by the [place] FDA and has been authorized for detection and/or diagnosis of SARS-CoV-2 by FDA under an Emergency Use Authorization (EUA). This EUA will remain in effect (meaning this test can be used) for the duration of the COVID-19 declaration under Section 564(b)(1) of the Act, 21  U.S.C. section 360bbb-3(b)(1), unless the authorization is terminated or revoked.  Performed at [facility], [address] [place], [place] [address]   Blood Culture (routine x 2)     Status: None   Collection Time: 04/23/23  4:39 AM   Specimen: BLOOD LEFT ARM  Result Value Ref Range Status   Specimen Description BLOOD LEFT ARM  Final   Special Requests   Final    BOTTLES DRAWN AEROBIC AND ANAEROBIC Blood Culture results may not be optimal due to an excessive volume of blood received in culture bottles   Culture   Final    NO GROWTH 5 DAYS Performed at [facility], [address] [place], [place] [address]    Report Status 04/28/2023 FINAL  Final  Blood Culture (routine x 2)     Status: None   Collection Time: 04/23/23  4:50 AM   Specimen: BLOOD  Result Value Ref Range Status   Specimen Description BLOOD SITE NOT SPECIFIED  Final   Special Requests   Final    BOTTLES DRAWN AEROBIC AND ANAEROBIC Blood Culture results may not be optimal due to an excessive volume of blood received in culture bottles   Culture   Final    NO GROWTH 5 DAYS Performed at [facility], [address] [place], [place] [address]  Report Status 04/28/2023 FINAL  Final  Urine Culture     Status: None   Collection Time: 04/23/23  5:43 AM   Specimen: Urine, Random  Result Value Ref Range Status   Specimen Description URINE, RANDOM  Final   Special Requests NONE Reflexed from [id number]  Final   Culture   Final    NO GROWTH Performed at [facility], [address] [place], [place] [address]    Report Status 04/24/2023 FINAL  Final  Body fluid culture w Gram Stain     Status: None (Preliminary result)   Collection Time: 04/27/23  2:35 PM   Specimen: Joint, Right Hip; Synovial Fluid  Result Value Ref Range Status   Specimen Description SYNOVIAL  Final   Special Requests RIGHT HIP  Final   Gram Stain   Final    ABUNDANT WBC PRESENT,BOTH PMN AND MONONUCLEAR RARE GRAM  NEGATIVE RODS CRITICAL RESULT CALLED TO, READ BACK BY AND VERIFIED WITH: RN Shawnie Dapper ON 04/27/23 @ 1702 BY DRT    Culture   Final    FEW ESCHERICHIA COLI Confirmed Extended Spectrum Beta-Lactamase Producer (ESBL).  In bloodstream infections from ESBL organisms, carbapenems are preferred over piperacillin/tazobactam. They are shown to have a lower risk of mortality. Sent to [facility] for further susceptibility testing. Performed at [facility], [address] [place], [place] [address]    Report Status PENDING  Incomplete   Organism ID, Bacteria ESCHERICHIA COLI  Final      Susceptibility   Escherichia coli - MIC*    AMPICILLIN >=32 RESISTANT Resistant     CEFEPIME 1 SENSITIVE Sensitive     CEFTAZIDIME RESISTANT Resistant     CEFTRIAXONE 32 RESISTANT Resistant     CIPROFLOXACIN >=4 RESISTANT Resistant     GENTAMICIN <=1 SENSITIVE Sensitive     IMIPENEM <=0.25 SENSITIVE Sensitive     TRIMETH/SULFA >=320 RESISTANT Resistant     AMPICILLIN/SULBACTAM >=32 RESISTANT Resistant     PIP/TAZO 8 SENSITIVE Sensitive     * FEW ESCHERICHIA COLI  Surgical pcr screen     Status: None   Collection Time: 04/29/23  8:42 PM   Specimen: Nasal Mucosa; Nasal Swab  Result Value Ref Range Status   MRSA, PCR NEGATIVE NEGATIVE Final   Staphylococcus aureus NEGATIVE NEGATIVE Final    Comment: (NOTE) The Xpert SA Assay (FDA approved for NASAL specimens in patients 46 years of age and older), is one component of a comprehensive surveillance program. It is not intended to diagnose infection nor to guide or monitor treatment. Performed at [facility], [address] [place], [place] [address]    Radiology Studies: No results found.  Scheduled Meds:  [START ON 05/02/2023] vitamin C  1,000 mg Per Tube Daily   [START ON 05/02/2023] atorvastatin  20 mg Per Tube Q1200   Chlorhexidine Gluconate Cloth  6 each Topical Q0600   docusate  100 mg Per Tube BID   enoxaparin (LOVENOX) injection  40  mg Subcutaneous Q24H   famotidine  20 mg Per Tube BID   feeding supplement (GLUCERNA SHAKE)  237 mL Oral BID BM   ferrous sulfate  325 mg Oral QODAY   insulin aspart  0-15 Units Subcutaneous TID WC   insulin aspart  0-5 Units Subcutaneous QHS   insulin aspart  3 Units Subcutaneous TID WC   insulin glargine-yfgn  12 Units Subcutaneous Daily   leptospermum manuka honey  1 Application Topical Daily   [START  ON 05/02/2023] multivitamin with minerals  1 tablet Per Tube Daily   nutrition supplement (JUVEN)  1 packet Oral BID BM   pantoprazole (PROTONIX) IV  40 mg Intravenous Q24H   polyethylene glycol  17 g Per Tube Daily   sodium chloride flush  3 mL Intravenous Q12H   [START ON 05/02/2023] zinc sulfate  220 mg Per Tube Daily   Continuous Infusions:  sodium chloride 75 mL/hr at 05/01/23 1459   ceFAZolin     magnesium sulfate bolus IVPB     meropenem (MERREM) IV 1 g (05/01/23 1500)   propofol (DIPRIVAN) infusion 20 mcg/kg/min (05/01/23 1434)     LOS: 8 days    Time spent: 35 mins    Willeen Niece, MD [facility]   If 7PM-7AM, please contact night-coverage

## 2023-05-01 NOTE — Anesthesia Procedure Notes (Signed)
Central Venous Catheter Insertion Performed by: Val Eagle, MD, anesthesiologist Start/End7/20/2024 11:32 AM, 05/01/2023 11:42 AM Patient location: PACU. Emergency situation Preanesthetic checklist: patient identified Position: Trendelenburg Lidocaine 1% used for infiltration Hand hygiene performed  and maximum sterile barriers used  Catheter size: 9 Fr MAC introducer Procedure performed using ultrasound guided technique. Ultrasound Notes:anatomy identified, needle tip was noted to be adjacent to the nerve/plexus identified, no ultrasound evidence of intravascular and/or intraneural injection and image(s) printed for medical record Attempts: 1 Following insertion, line sutured, dressing applied and Biopatch. Post procedure assessment: blood return through all ports, free fluid flow and no air  Patient tolerated the procedure well with no immediate complications.

## 2023-05-01 NOTE — Anesthesia Procedure Notes (Signed)
Procedure Name: Intubation Date/Time: 05/01/2023 12:20 PM  Performed by: Wilder Glade, CRNAPre-anesthesia Checklist: Patient identified, Emergency Drugs available, Suction available and Patient being monitored Patient Re-evaluated:Patient Re-evaluated prior to induction Oxygen Delivery Method: Circle system utilized Preoxygenation: Pre-oxygenation with 100% oxygen Induction Type: IV induction and Rapid sequence Laryngoscope Size: Miller and 2 Grade View: Grade I Tube type: Oral Tube size: 7.0 mm Number of attempts: 1 Airway Equipment and Method: Stylet Placement Confirmation: ETT inserted through vocal cords under direct vision, positive ETCO2 and breath sounds checked- equal and bilateral Secured at: 21 cm Tube secured with: Tape Dental Injury: Teeth and Oropharynx as per pre-operative assessment

## 2023-05-01 NOTE — Anesthesia Procedure Notes (Signed)
Central Venous Catheter Insertion Performed by: Val Eagle, MD, anesthesiologist Patient location: PACU. Emergency situation Preanesthetic checklist: patient identified Position: supine Hand hygiene performed  and maximum sterile barriers used  Triple lumen Procedure performed without using ultrasound guided technique. Attempts: 1 Post procedure assessment: blood return through all ports and free fluid flow  Patient tolerated the procedure well with no immediate complications. Additional procedure comments: Triple lumen slick catheter through introducer. Charted for RN documentation only. Not a separate central line procedure from introducer. [name]

## 2023-05-01 NOTE — Plan of Care (Signed)
  Problem: Coping: Goal: Level of anxiety will decrease Outcome: Progressing   Problem: Elimination: Goal: Will not experience complications related to bowel motility Outcome: Progressing   Problem: Safety: Goal: Ability to remain free from injury will improve Outcome: Completed/Met

## 2023-05-01 NOTE — Transfer of Care (Signed)
Immediate Anesthesia Transfer of Care Note  Patient: John Meza  Procedure(s) Performed: WOUND EXPLORATION OF HIP (Right)  Patient Location: ICU  Anesthesia Type:General  Level of Consciousness: sedated and Patient remains intubated per anesthesia plan  Airway & Oxygen Therapy: Patient remains intubated per anesthesia plan and Patient placed on Ventilator (see vital sign flow sheet for setting)  Post-op Assessment: Report given to RN and Post -op Vital signs reviewed and stable  Post vital signs: Reviewed and stable  Last Vitals:  Vitals Value Taken Time  BP 144/78 05/01/23 1400  Temp    Pulse 97 05/01/23 1414  Resp 16 05/01/23 1414  SpO2 100 % 05/01/23 1414  Vitals shown include unfiled device data.  Last Pain:  Vitals:   05/01/23 1210  TempSrc: Temporal  PainSc:          Complications: No notable events documented.

## 2023-05-01 NOTE — Anesthesia Preprocedure Evaluation (Signed)
Anesthesia Evaluation  Preop documentation limited or incomplete due to emergent nature of procedure.  Airway        Dental   Pulmonary former smoker          Cardiovascular hypertension,  Rhythm:Regular Rate:Tachycardia     Neuro/Psych    GI/Hepatic   Endo/Other  diabetes    Renal/GU      Musculoskeletal   Abdominal   Peds  Hematology  (+) Blood dyscrasia, anemia   Anesthesia Other Findings Active bleeding from surgical site and symptomatic hypotension  Reproductive/Obstetrics                             Anesthesia Physical Anesthesia Plan  ASA: 5 and emergent  Anesthesia Plan: General   Post-op Pain Management:    Induction: Rapid sequence and Intravenous  PONV Risk Score and Plan: 2 and Treatment may vary due to age or medical condition  Airway Management Planned: Oral ETT  Additional Equipment: Arterial line, Ultrasound Guidance Line Placement and CVP  Intra-op Plan:   Post-operative Plan: Post-operative intubation/ventilation  Informed Consent:      Only emergency history available  Plan Discussed with: CRNA and Surgeon  Anesthesia Plan Comments:        Anesthesia Quick Evaluation

## 2023-05-01 NOTE — Procedures (Addendum)
Extubation Procedure Note  Patient Details:   Name: John Meza DOB: 1961-04-05 MRN: [id number]   Airway Documentation:  AIRWAYS 7 mm (Active)   Vent end date: 05/01/23 Vent end time: 1550   Evaluation  O2 sats: stable throughout Complications: No apparent complications Patient did tolerate procedure well. Bilateral Breath Sounds: Clear    Patient extubated per written order with RN at bedside. Patient was placed on 4L [place] and tolerating well at this time. Positive cuff leak was heard prior to extubation, no strider noted. Patient able to verbalized his name.      Yes  John Meza John Meza 05/01/2023, 3:59 PM

## 2023-05-01 NOTE — Anesthesia Procedure Notes (Signed)
Arterial Line Insertion Start/End7/20/2024 11:12 AM, 05/01/2023 11:18 AM Performed by: Val Eagle, MD, anesthesiologist  Patient location: PACU. Preanesthetic checklist: patient identified and monitors and equipment checked Emergency situation Right, radial was placed Catheter size: 20 G  Attempts: 1 Procedure performed without using ultrasound guided technique. Following insertion, dressing applied and Biopatch. Post procedure assessment: normal and unchanged  Patient tolerated the procedure well with no immediate complications.

## 2023-05-01 NOTE — Anesthesia Postprocedure Evaluation (Signed)
Anesthesia Post Note  Patient: John Meza  Procedure(s) Performed: RIGHT HIP DISARTICULATION (Right: Hip)     Anesthesia Type: General Anesthetic complications: no  Patient taken back to OR     Prisila Dlouhy

## 2023-05-02 LAB — PREPARE FRESH FROZEN PLASMA
Unit division: 0
Unit division: 0
Unit division: 0

## 2023-05-02 LAB — TYPE AND SCREEN
ABO/RH(D): O POS
Unit division: 0
Unit division: 0
Unit division: 0
Unit division: 0
Unit division: 0

## 2023-05-02 LAB — BPAM RBC
Blood Product Expiration Date: 202408232359
Blood Product Expiration Date: 202408232359
Blood Product Expiration Date: 202408232359
Blood Product Expiration Date: 202408252359
ISSUE DATE / TIME: 202407200720
ISSUE DATE / TIME: 202407201119
ISSUE DATE / TIME: 202407201226
ISSUE DATE / TIME: 202407201226
ISSUE DATE / TIME: 202407201226
ISSUE DATE / TIME: 202407201232
Unit Type and Rh: 5100
Unit Type and Rh: 5100

## 2023-05-02 LAB — PREPARE PLATELET PHERESIS: Unit division: 0

## 2023-05-02 LAB — BASIC METABOLIC PANEL
Anion gap: 5 (ref 5–15)
BUN: 12 mg/dL (ref 6–20)
CO2: 23 mmol/L (ref 22–32)
Calcium: 8 mg/dL — ABNORMAL LOW (ref 8.9–10.3)
Chloride: 106 mmol/L (ref 98–111)
Creatinine, Ser: 0.57 mg/dL — ABNORMAL LOW (ref 0.61–1.24)
GFR, Estimated: >60 mL/min (ref >60)
Glucose, Bld: 113 mg/dL — ABNORMAL HIGH (ref 70–99)
Potassium: 3.4 mmol/L — ABNORMAL LOW (ref 3.5–5.1)
Sodium: 134 mmol/L — ABNORMAL LOW (ref 135–145)

## 2023-05-02 LAB — GLUCOSE, CAPILLARY
Glucose-Capillary: 108 mg/dL — ABNORMAL HIGH (ref 70–99)
Glucose-Capillary: 154 mg/dL — ABNORMAL HIGH (ref 70–99)
Glucose-Capillary: 146 mg/dL — ABNORMAL HIGH (ref 70–99)
Glucose-Capillary: 142 mg/dL — ABNORMAL HIGH (ref 70–99)

## 2023-05-02 LAB — CBC
HCT: 30.3 % — ABNORMAL LOW (ref 39.0–52.0)
Hemoglobin: 10.4 g/dL — ABNORMAL LOW (ref 13.0–17.0)
MCH: 29.9 pg (ref 26.0–34.0)
MCHC: 34.3 g/dL (ref 30.0–36.0)
MCV: 87.1 fL (ref 80.0–100.0)
Platelets: 128 10*3/uL — ABNORMAL LOW (ref 150–400)
RBC: 3.48 MIL/uL — ABNORMAL LOW (ref 4.22–5.81)
RDW: 14.9 % (ref 11.5–15.5)
WBC: 11.7 10*3/uL — ABNORMAL HIGH (ref 4.0–10.5)
nRBC: 0 % (ref 0.0–0.2)

## 2023-05-02 LAB — PREPARE CRYOPRECIPITATE

## 2023-05-02 LAB — BPAM FFP
ISSUE DATE / TIME: 202407201158
ISSUE DATE / TIME: 202407201158
ISSUE DATE / TIME: 202407201226
ISSUE DATE / TIME: 202407201259
ISSUE DATE / TIME: 202407201259
ISSUE DATE / TIME: 202407210214
Unit Type and Rh: 5100
Unit Type and Rh: 5100
Unit Type and Rh: 6200
Unit Type and Rh: 6200

## 2023-05-02 LAB — BPAM CRYOPRECIPITATE
ISSUE DATE / TIME: 202407201231
Unit Type and Rh: 5100

## 2023-05-02 LAB — BPAM PLATELET PHERESIS: Blood Product Expiration Date: 202407212359

## 2023-05-02 MED ORDER — DOCUSATE SODIUM 100 MG PO CAPS
100.0000 mg | ORAL_CAPSULE | Freq: Two times a day (BID) | ORAL | Status: DC
  Administered 2023-05-03 – 2023-05-05 (×4): 100 mg via ORAL
  Filled 2023-05-02 (×4): qty 1

## 2023-05-02 MED ORDER — PANTOPRAZOLE SODIUM 40 MG PO TBEC
40.0000 mg | DELAYED_RELEASE_TABLET | Freq: Every day | ORAL | Status: DC
  Administered 2023-05-02 – 2023-05-04 (×3): 40 mg via ORAL
  Filled 2023-05-02 (×3): qty 1

## 2023-05-02 MED ORDER — ADULT MULTIVITAMIN W/MINERALS CH
1.0000 | ORAL_TABLET | Freq: Every day | ORAL | Status: DC
  Administered 2023-05-03 – 2023-05-05 (×3): 1 via ORAL
  Filled 2023-05-02 (×3): qty 1

## 2023-05-02 MED ORDER — VITAMIN C 500 MG PO TABS
1000.0000 mg | ORAL_TABLET | Freq: Every day | ORAL | Status: DC
  Administered 2023-05-03 – 2023-05-05 (×3): 1000 mg via ORAL
  Filled 2023-05-02 (×3): qty 2

## 2023-05-02 MED ORDER — ZINC SULFATE 220 (50 ZN) MG PO CAPS
220.0000 mg | ORAL_CAPSULE | Freq: Every day | ORAL | Status: DC
  Administered 2023-05-03 – 2023-05-05 (×3): 220 mg via ORAL
  Filled 2023-05-02 (×3): qty 1

## 2023-05-02 MED ORDER — POLYETHYLENE GLYCOL 3350 17 G PO PACK
17.0000 g | PACK | Freq: Every day | ORAL | Status: DC
  Administered 2023-05-04 – 2023-05-05 (×2): 17 g via ORAL
  Filled 2023-05-02 (×2): qty 1

## 2023-05-02 MED ORDER — POTASSIUM CHLORIDE 20 MEQ PO PACK
40.0000 meq | PACK | Freq: Once | ORAL | Status: AC
  Administered 2023-05-02: 40 meq via ORAL
  Filled 2023-05-02: qty 2

## 2023-05-02 MED ORDER — CHLORHEXIDINE GLUCONATE CLOTH 2 % EX PADS
6.0000 | MEDICATED_PAD | Freq: Every day | CUTANEOUS | Status: DC
  Administered 2023-05-02 – 2023-05-05 (×4): 6 via TOPICAL

## 2023-05-02 NOTE — Progress Notes (Signed)
Physical Therapy Treatment/Re-evaluation Patient Details Name: John Meza MRN: [id number] DOB: 03/22/1961 Today's Date: 05/02/2023   History of Present Illness Patient is a 62 year old male admitted 7/12 presented with fever, N/V.  UA suspicious for pyuria vs. virus. 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 PMH: paraplegia (fall from ladder), neurogenic bladder requiring self cath, recurrent ESBL UTI, diabetes type 2, hypertension, chronic right buttock pressure ulcer. He was recently hospitalized here in June for complicated UTI due to Klebsiella pneumonia, Morganella morganii and was discharged on Cipro.    PT Comments  Pt seen in conjunction with OT to maximize safe mobility progression and to assess current level of function s/p surgery. Pt now s/p R hip disarticulation on 05/01/2023. Pt hesitant to work with therapies but agreeable. Main limiting factor appears to be pain in B UE's at IV sites. Therapists locked arms/elbows with pt for optimal trunk control and safety during transition to/from EOB as pt refused to utilize UE's otherwise. With encouragement and heavy mod assist for sitting balance, pt able to use RUE to wipe his face with a washcloth while sitting EOB. Pt reports increased difficulty with sitting balance now s/p surgery. Recommend intensive, multidisciplinary rehab to maximize functional independence, safety, and facilitate safe d/c home with family support.     Assistance Recommended at Discharge Frequent or constant Supervision/Assistance  If plan is discharge home, recommend the following:  Can travel by private vehicle    A lot of help with bathing/dressing/bathroom;Assistance with cooking/housework;Direct supervision/assist for medications management;Assist for transportation;Help with stairs or ramp for entrance;Two people to help with walking and/or transfers      Equipment Recommendations   (hoyer lift)    Recommendations for Other Services        Precautions / Restrictions Precautions Precautions: Fall Precaution Comments: s/p R hip disarticulation 7/20; wound VAC Restrictions Weight Bearing Restrictions: No RLE Weight Bearing: Weight bearing as tolerated Other Position/Activity Restrictions: Pt has no sensation or AROM to LE     Mobility  Bed Mobility Overal bed mobility: Needs Assistance Bed Mobility: Supine to Sit, Sit to Supine     Supine to sit: +2 for physical assistance, +2 for safety/equipment, Max assist Sit to supine: Max assist, +2 for physical assistance, +2 for safety/equipment   General bed mobility comments: Pt with good skill to maintain locked arms with therapist during pull into long sit, however, limited engagement in core. Pt requiring BUE support to maintain static sitting and total A to manage turning BLE OOB and repositioning hips. Therapists locked elbows with pt for assist due to pt declining to utilize hands 2 pain    Transfers                   General transfer comment: declined OOB despite max encouragement    Ambulation/Gait               General Gait Details: Non ambulatory at baseline   Stairs             Wheelchair Mobility     Tilt Bed    Modified Rankin (Stroke Patients Only)       Balance Overall balance assessment: Needs assistance Sitting-balance support: Bilateral upper extremity supported, Feet supported, No upper extremity supported, Single extremity supported Sitting balance-Leahy Scale: Poor Sitting balance - Comments: Pt requires BUE support. Mod A for unilateral tasks EOB. Poor truncal control and pt reports more difficulty balancing now s/p hip disarticulation  Cognition Arousal/Alertness: Awake/alert Behavior During Therapy: Flat affect Overall Cognitive Status: Difficult to assess                                 General Comments: Pt benefitting from encouragement for  mobility after having had surgery yesterday.        Exercises      General Comments General comments (skin integrity, edema, etc.): VSS      Pertinent Vitals/Pain Pain Assessment Pain Assessment: Faces Faces Pain Scale: Hurts even more Pain Location: bil hands with IV placement. Pain Descriptors / Indicators: Guarding, Other (Comment) (Painful to touch) Pain Intervention(s): Limited activity within patient's tolerance, Monitored during session, Repositioned (Pt declined pillows underneath UE's)    Home Living                          Prior Function            PT Goals (current goals can now be found in the care plan section) Acute Rehab PT Goals Patient Stated Goal: to get stronger PT Goal Formulation: With patient Time For Goal Achievement: 05/08/23 Potential to Achieve Goals: Fair Progress towards PT goals: Progressing toward goals    Frequency    Min 1X/week      PT Plan Discharge plan needs to be updated    Co-evaluation PT/OT/SLP Co-Evaluation/Treatment: Yes Reason for Co-Treatment: Complexity of the patient's impairments (multi-system involvement);For patient/therapist safety;To address functional/ADL transfers PT goals addressed during session: Mobility/safety with mobility;Balance OT goals addressed during session: ADL's and self-care      AM-PAC PT "6 Clicks" Mobility   Outcome Measure  Help needed turning from your back to your side while in a flat bed without using bedrails?: A Lot Help needed moving from lying on your back to sitting on the side of a flat bed without using bedrails?: Total Help needed moving to and from a bed to a chair (including a wheelchair)?: Total Help needed standing up from a chair using your arms (e.g., wheelchair or bedside chair)?: Total Help needed to walk in hospital room?: Total Help needed climbing 3-5 steps with a railing? : Total 6 Click Score: 7    End of Session   Activity Tolerance: Patient  limited by pain;Patient limited by fatigue Patient left: in bed;with call bell/phone within reach;with bed alarm set;with family/visitor present Nurse Communication: Mobility status;Need for lift equipment PT Visit Diagnosis: Muscle weakness (generalized) (M62.81);Other abnormalities of gait and mobility (R26.89)     Time: 4696-2952 PT Time Calculation (min) (ACUTE ONLY): 26 min  Charges:    $Therapeutic Activity: 8-22 mins PT General Charges $$ ACUTE PT VISIT: 1 Visit                     John Meza, PT, DPT Acute Rehabilitation Services Secure Chat Preferred Office: [phone]    John Meza 05/02/2023, 1:25 PM

## 2023-05-02 NOTE — Progress Notes (Signed)
PROGRESS NOTE    John Meza  ZOX:[id number] DOB: 1961-04-25 DOA: 04/23/2023 PCP: Storm Frisk, MD   Brief Narrative:  This 62 year old male with history of paraplegia, neurogenic bladder requiring self cath, recurrent ESBL UTI, diabetes type 2, hypertension, chronic right buttock pressure ulcer who presented with fever. He was recently hospitalized here in June for complicated UTI due to Klebsiella pneumonia, Morganella morganii and was discharged on Cipro. Report of fever with nausea and vomiting. On presentation, lab work showed WBC count of 26,000, lactate of 3. UA suspicious for pyuria. Started on Merem. Continued to be febrile intermittently. Urine culture has not shown any growth. CT abdomen/pelvis showed large collection around the right hip. IR consulted for drain. Orthopedics also consulted. 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  Surgical course complicated by hemorrhagic shock requiring multiple units of blood transfusion and subsequent intubation.  Patient successfully extubated 05/02/2023.  Assessment & Plan:   Principal Problem:   Sepsis (HCC) Active Problems:   Controlled type 2 diabetes mellitus (HCC)   Paraplegia (HCC)   Hyponatremia   Essential hypertension   Complicated UTI (urinary tract infection)   Normocytic anemia   Hyperlipidemia associated with type 2 diabetes mellitus (HCC)   Leukocytosis   Fever of unknown origin   Septic hip (HCC)   Osteomyelitis of right hip (HCC)   Abscess of right hip   Pelvic abscess in male [facility])   Hematoma of hip, initial encounter  Septic arthritis Right hip: -Presented with leukocytosis, fever, tachycardia, dyspnea.  Elevated lactate.  Blood cultures not showing any growth.   -Suspected UTI but urine culture did not show any growth.  Started on [place]. Procalcitonin found to be elevated.   -Continued to spike fever intermittently.. Chest x-ray did not show pneumonia. >>CT abdomen/pelvis showed  large collection on the right hip.  Right hip area also found to be edematous.   -Patient is paraplegic and does not have any sensation. He did not complain of any pain. -Orthopedics consulted>>appreciate Dr. Lajoyce Corners expertise -aspirate culture = ESBL E coli  -ID consulted>>continue merrem, d/c vanco -Dr. Duda>>hip disarticulation--planned 7/20 -Patient underwent Right hip disarticulation 7/20. Surgical course complicated by hemorrhagic shock requiring multiple units of blood transfusion and subsequent intubation. -Patient successfully extubated later in the day 7/20.   Recurrent complicated UTI: History of ESBL E. coli UTI in February 2021, treated for Klebsiella/Morganella UTI a month ago.   Patient has neurogenic bladder, requires self cath.   Currently on Foley cath.   Plan from previous hospitalist to potentially discharge with indwelling foley.  -7/12 Urine culture did not show any growth.   Nausea/vomiting:   Continue antiemetics.  -Resolved--tolerating diet.   Diabetes type 2: -04/12/23 A1c of 7.  On Glucophage, Januvia at home.   -Currently on sliding scale - basal added.   Hypertension:  -HCTZ discontinued for hyponatremia. -arb on hold as well   Hyperlipidemia: Continue Lipitor   Paraplegia: On wheelchair-bound.  Has neurogenic bladder.     Chronic right buttock pressure ulcer: Wound care following.     Hyponatremia: Looks like this is a chronic problem: On reviewing his previous lab works, he is sodium has been in the range of 120s.   Discontinue hydrochlorothiazide.  Hold ARB as well.  Monitor.   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 PO iron every other day Maintain active type and screen  DVT prophylaxis: SCD Code Status:Full code Family  Communication: None Disposition Plan:   Status is: Inpatient Remains inpatient appropriate because: Continued need for inpatient care Patient underwent  Right hip disarticulation 7/20.  Surgical course complicated by hemorrhagic shock requiring multiple units of blood transfusion and subsequent intubation, now successfully extubated.   Consultants:  ID  Ortho  Procedures: None  Antimicrobials:  Anti-infectives (From admission, onward)    Start     Dose/Rate Route Frequency Ordered Stop   05/01/23 1515  ceFAZolin (ANCEF) IVPB 2g/100 mL premix  Status:  Discontinued        2 g 200 mL/hr over 30 Minutes Intravenous Every 8 hours 05/01/23 1424 05/01/23 1445   05/01/23 0704  ceFAZolin (ANCEF) 2-4 GM/100ML-% IVPB       Note to Pharmacy: Kathrene Bongo D: cabinet override      05/01/23 0704 05/01/23 1914   05/01/23 0700  ceFAZolin (ANCEF) IVPB 2g/100 mL premix  Status:  Discontinued        2 g 200 mL/hr over 30 Minutes Intravenous To ShortStay Surgical 04/30/23 2134 05/01/23 1348   04/30/23 1300  ceFAZolin (ANCEF) IVPB 2g/100 mL premix  Status:  Discontinued        2 g 200 mL/hr over 30 Minutes Intravenous To ShortStay Surgical 04/29/23 2039 04/30/23 2137   04/29/23 1400  meropenem (MERREM) 1 g in sodium chloride 0.9 % 100 mL IVPB        1 g 200 mL/hr over 30 Minutes Intravenous Every 8 hours 04/29/23 0832     04/28/23 1400  meropenem (MERREM) 2 g in sodium chloride 0.9 % 100 mL IVPB  Status:  Discontinued        2 g 280 mL/hr over 30 Minutes Intravenous Every 8 hours 04/28/23 1305 04/29/23 0832   04/27/23 1800  vancomycin (VANCOREADY) IVPB 1500 mg/300 mL  Status:  Discontinued        1,500 mg 150 mL/hr over 120 Minutes Intravenous Every 24 hours 04/26/23 1656 04/28/23 0934   04/26/23 1730  vancomycin (VANCOREADY) IVPB 1750 mg/350 mL        1,750 mg 175 mL/hr over 120 Minutes Intravenous  Once 04/26/23 1656 04/26/23 1922   04/24/23 1100  meropenem (MERREM) 1 g in sodium chloride 0.9 % 100 mL IVPB  Status:  Discontinued        1 g 200 mL/hr over 30 Minutes Intravenous Every 8 hours 04/24/23 0953 04/28/23 1305   04/23/23 0445   ceFEPIme (MAXIPIME) 2 g in sodium chloride 0.9 % 100 mL IVPB  Status:  Discontinued        2 g 200 mL/hr over 30 Minutes Intravenous  Once 04/23/23 0438 04/23/23 0443   04/23/23 0445  meropenem (MERREM) 1 g in sodium chloride 0.9 % 100 mL IVPB        1 g 200 mL/hr over 30 Minutes Intravenous  Once 04/23/23 0443 04/23/23 0550      Subjective: Patient was seen and examined at bed side. Overnight event noted,  Patient underwent right hip disarticulation yesterday and had  hemorrhagic shock requiring multiple units of blood transfusion and subsequent intubation. Patient successfully extubated yesterday.  He reports pain is reasonably controlled.  Objective: Vitals:   05/02/23 0534 05/02/23 0600 05/02/23 0700 05/02/23 0800  BP: 124/74 126/66 127/69 128/69  Pulse: 88 85 85 87  Resp: 17 15 15 13   Temp:    98.6 F (37 C)  TempSrc:    Oral  SpO2: 100% 100% 99% 99%  Weight:  Height:        Intake/Output Summary (Last 24 hours) at 05/02/2023 1032 Last data filed at 05/02/2023 0550 Gross per 24 hour  Intake 7586.1 ml  Output 3750 ml  Net 3836.1 ml   Filed Weights   05/01/23 0709 05/01/23 1447  Weight: 87 kg 72.4 kg    Examination:  General exam: Appears calm and comfortable, not in any acute distress. Respiratory system: Clear to auscultation. Respiratory effort normal. RR15 Cardiovascular system: S1 & S2 heard, RRR.  No pedal edema. Gastrointestinal system: Abdomen is non distended, soft and non tender. Normal bowel sounds heard. Central nervous system: Alert and oriented x 3, Extremities: RLE swelling Skin: No rashes, lesions or ulcers Psychiatry: No psychosis, no SI no HI.   Data Reviewed: I have personally reviewed following labs and imaging studies  CBC: Recent Labs  Lab 04/28/23 0005 04/29/23 0131 04/30/23 1445 04/30/23 2232 05/01/23 1148 05/01/23 1301 05/01/23 1326 05/01/23 1535 05/02/23 0540  WBC 13.8* 12.1* 14.8*  --   --   --   --  20.8* 11.7*   NEUTROABS  --   --  11.9*  --   --   --   --  17.7*  --   HGB 7.1* 7.2* 8.2*   < > 5.1* 11.2* 10.2* 10.6* 10.4*  HCT 22.1* 22.6* 25.1*   < > 15.0* 33.0* 30.0* 31.0* 30.3*  MCV 86.3 85.9 87.5  --   --   --   --  89.6 87.1  PLT 407* 428* 457*  --   --   --   --  104* 128*   < > = values in this interval not displayed.   Basic Metabolic Panel: Recent Labs  Lab 04/28/23 0005 04/29/23 0131 04/30/23 1445 05/01/23 1148 05/01/23 1301 05/01/23 1326 05/01/23 1535 05/02/23 0540  NA 127* 128* 132* 136 136 137 136 134*  K 4.0 3.8 3.5 4.2 4.2 3.9 3.8 3.4*  CL 102 101 103  --   --   --  106 106  CO2 19* 19* 22  --   --   --  19* 23  GLUCOSE 252* 215* 198*  --   --   --  260* 113*  BUN 26* 31* 26*  --   --   --  19 12  CREATININE 0.75 0.89 0.79  --   --   --  0.69 0.57*  CALCIUM 7.7* 8.0* 8.4*  --   --   --  8.6* 8.0*  MG  --  1.4* 1.5*  --   --   --  1.1*  --   PHOS  --   --  2.2*  --   --   --  5.8*  --    GFR: Estimated Creatinine Clearance: 90.7 mL/min (A) (by C-G formula based on SCr of 0.57 mg/dL (L)). Liver Function Tests: Recent Labs  Lab 04/30/23 1445  AST 43*  ALT 58*  ALKPHOS 96  BILITOT 0.2*  PROT 5.2*  ALBUMIN 1.6*   No results for input(s): "LIPASE", "AMYLASE" in the last 168 hours. No results for input(s): "AMMONIA" in the last 168 hours. Coagulation Profile: No results for input(s): "INR", "PROTIME" in the last 168 hours. Cardiac Enzymes: No results for input(s): "CKTOTAL", "CKMB", "CKMBINDEX", "TROPONINI" in the last 168 hours. BNP (last 3 results) No results for input(s): "PROBNP" in the last 8760 hours. HbA1C: No results for input(s): "HGBA1C" in the last 72 hours. CBG: Recent Labs  Lab 05/01/23 1419 05/01/23  1552 05/01/23 1930 05/01/23 2151 05/02/23 0748  GLUCAP 247* 253* 170* 136* 108*   Lipid Profile: No results for input(s): "CHOL", "HDL", "LDLCALC", "TRIG", "CHOLHDL", "LDLDIRECT" in the last 72 hours. Thyroid Function Tests: No results for  input(s): "TSH", "T4TOTAL", "FREET4", "T3FREE", "THYROIDAB" in the last 72 hours. Anemia Panel: Recent Labs    04/30/23 1445  FOLATE 9.5   Sepsis Labs: No results for input(s): "PROCALCITON", "LATICACIDVEN" in the last 168 hours.  Recent Results (from the past 240 hour(s))  Resp panel by RT-PCR (RSV, Flu A&B, Covid) Anterior Nasal Swab     Status: None   Collection Time: 04/23/23  4:37 AM   Specimen: Anterior Nasal Swab  Result Value Ref Range Status   SARS Coronavirus 2 by RT PCR NEGATIVE NEGATIVE Final   Influenza A by PCR NEGATIVE NEGATIVE Final   Influenza B by PCR NEGATIVE NEGATIVE Final    Comment: (NOTE) The Xpert Xpress SARS-CoV-2/FLU/RSV plus assay is intended as an aid in the diagnosis of influenza from Nasopharyngeal swab specimens and should not be used as a sole basis for treatment. Nasal washings and aspirates are unacceptable for Xpert Xpress SARS-CoV-2/FLU/RSV testing.  Fact Sheet for Patients: BloggerCourse.com  Fact Sheet for Healthcare Providers: SeriousBroker.it  This test is not yet approved or cleared by the [place] FDA and has been authorized for detection and/or diagnosis of SARS-CoV-2 by FDA under an Emergency Use Authorization (EUA). This EUA will remain in effect (meaning this test can be used) for the duration of the COVID-19 declaration under Section 564(b)(1) of the Act, 21 U.S.C. section 360bbb-3(b)(1), unless the authorization is terminated or revoked.     Resp Syncytial Virus by PCR NEGATIVE NEGATIVE Final    Comment: (NOTE) Fact Sheet for Patients: BloggerCourse.com  Fact Sheet for Healthcare Providers: SeriousBroker.it  This test is not yet approved or cleared by the [place] FDA and has been authorized for detection and/or diagnosis of SARS-CoV-2 by FDA under an Emergency Use Authorization (EUA). This EUA will remain in  effect (meaning this test can be used) for the duration of the COVID-19 declaration under Section 564(b)(1) of the Act, 21 U.S.C. section 360bbb-3(b)(1), unless the authorization is terminated or revoked.  Performed at [facility], [address], [place], [place] [address]   Blood Culture (routine x 2)     Status: None   Collection Time: 04/23/23  4:39 AM   Specimen: BLOOD LEFT ARM  Result Value Ref Range Status   Specimen Description BLOOD LEFT ARM  Final   Special Requests   Final    BOTTLES DRAWN AEROBIC AND ANAEROBIC Blood Culture results may not be optimal due to an excessive volume of blood received in culture bottles   Culture   Final    NO GROWTH 5 DAYS Performed at [facility], [address], [place], [place] [address]    Report Status 04/28/2023 FINAL  Final  Blood Culture (routine x 2)     Status: None   Collection Time: 04/23/23  4:50 AM   Specimen: BLOOD  Result Value Ref Range Status   Specimen Description BLOOD SITE NOT SPECIFIED  Final   Special Requests   Final    BOTTLES DRAWN AEROBIC AND ANAEROBIC Blood Culture results may not be optimal due to an excessive volume of blood received in culture bottles   Culture   Final    NO GROWTH 5 DAYS Performed at [facility], [address] [place],  [place] [address]    Report Status 04/28/2023 FINAL  Final  Urine Culture     Status: None   Collection Time: 04/23/23  5:43 AM   Specimen: Urine, Random  Result Value Ref Range Status   Specimen Description URINE, RANDOM  Final   Special Requests NONE Reflexed from [id number]  Final   Culture   Final    NO GROWTH Performed at [facility], [address] [place], [place] [address]    Report Status 04/24/2023 FINAL  Final  Body fluid culture w Gram Stain     Status: None (Preliminary result)   Collection Time: 04/27/23  2:35 PM   Specimen: Joint, Right Hip; Synovial Fluid  Result Value Ref Range Status   Specimen Description SYNOVIAL  Final    Special Requests RIGHT HIP  Final   Gram Stain   Final    ABUNDANT WBC PRESENT,BOTH PMN AND MONONUCLEAR RARE GRAM NEGATIVE RODS CRITICAL RESULT CALLED TO, READ BACK BY AND VERIFIED WITH: RN Shawnie Dapper ON 04/27/23 @ 1702 BY DRT    Culture   Final    FEW ESCHERICHIA COLI Confirmed Extended Spectrum Beta-Lactamase Producer (ESBL).  In bloodstream infections from ESBL organisms, carbapenems are preferred over piperacillin/tazobactam. They are shown to have a lower risk of mortality. Sent to [facility] for further susceptibility testing. Performed at [facility], [address], [place], [place] [address]    Report Status PENDING  Incomplete   Organism ID, Bacteria ESCHERICHIA COLI  Final      Susceptibility   Escherichia coli - MIC*    AMPICILLIN >=32 RESISTANT Resistant     CEFEPIME 1 SENSITIVE Sensitive     CEFTAZIDIME RESISTANT Resistant     CEFTRIAXONE 32 RESISTANT Resistant     CIPROFLOXACIN >=4 RESISTANT Resistant     GENTAMICIN <=1 SENSITIVE Sensitive     IMIPENEM <=0.25 SENSITIVE Sensitive     TRIMETH/SULFA >=320 RESISTANT Resistant     AMPICILLIN/SULBACTAM >=32 RESISTANT Resistant     PIP/TAZO 8 SENSITIVE Sensitive     * FEW ESCHERICHIA COLI  Surgical pcr screen     Status: None   Collection Time: 04/29/23  8:42 PM   Specimen: Nasal Mucosa; Nasal Swab  Result Value Ref Range Status   MRSA, PCR NEGATIVE NEGATIVE Final   Staphylococcus aureus NEGATIVE NEGATIVE Final    Comment: (NOTE) The Xpert SA Assay (FDA approved for NASAL specimens in patients 106 years of age and older), is one component of a comprehensive surveillance program. It is not intended to diagnose infection nor to guide or monitor treatment. Performed at [facility], [address] [place], [place] [address]   Aerobic/Anaerobic Culture w Gram Stain (surgical/deep wound)     Status: None (Preliminary result)   Collection Time: 05/01/23  9:01 AM   Specimen: Path fluid; Body Fluid  Result  Value Ref Range Status   Specimen Description ABSCESS  Final   Special Requests Right hip  Final   Gram Stain   Final    FEW WBC SEEN NO ORGANISMS SEEN Performed at [facility], [address], [place], [place] [address]    Culture PENDING  Incomplete   Report Status PENDING  Incomplete   Radiology Studies: No results found.  Scheduled Meds:  [START ON 05/03/2023] vitamin C  1,000 mg Oral Daily   atorvastatin  20 mg Per Tube Q1200   Chlorhexidine Gluconate Cloth  6 each Topical Daily   docusate sodium  100 mg Oral BID   enoxaparin (LOVENOX) injection  40 mg Subcutaneous Q24H   famotidine  20 mg Per Tube BID   feeding supplement (GLUCERNA SHAKE)  237 mL Oral BID BM   ferrous sulfate  325 mg Oral QODAY   insulin aspart  0-15 Units Subcutaneous TID WC   insulin aspart  0-5 Units Subcutaneous QHS   insulin aspart  3 Units Subcutaneous TID WC   insulin glargine-yfgn  12 Units Subcutaneous Daily   leptospermum manuka honey  1 Application Topical Daily   [START ON 05/03/2023] multivitamin with minerals  1 tablet Oral Daily   nutrition supplement (JUVEN)  1 packet Oral BID BM   pantoprazole (PROTONIX) IV  40 mg Intravenous Q24H   [START ON 05/03/2023] polyethylene glycol  17 g Oral Daily   sodium chloride flush  3 mL Intravenous Q12H   [START ON 05/03/2023] zinc sulfate  220 mg Oral Daily   Continuous Infusions:  sodium chloride 75 mL/hr at 05/02/23 0400   magnesium sulfate bolus IVPB     meropenem (MERREM) IV 1 g (05/02/23 0533)     LOS: 9 days    Time spent: 35 mins    Willeen Niece, MD [facility]   If 7PM-7AM, please contact night-coverage

## 2023-05-02 NOTE — Consult Note (Signed)
WOC team consulted for NPWT on this patient.  Patient had wound exploration of hip with excisional debridement of skin and soft tissue muscle and fascia, application of kerecis micrograft and customizable wound vac by Dr. Lajoyce Corners 05/01/2023.  This is an incisional wound vac which typically is  left in place for 7 days.   If there are issues with this NPWT Dr. Lajoyce Corners should be contacted.   Thank you,    Priscella Mann MSN, RN-BC, [employer] [phone]

## 2023-05-02 NOTE — Progress Notes (Addendum)
Occupational Therapy Treatment Patient Details Name: John Meza MRN: [id number] DOB: [birth year] Today's Date: 05/02/2023   History of present illness Patient is a 62 year old male admitted 7/12 presented with fever, N/V.  UA suspicious for pyuria vs. virus. 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 PMH: paraplegia (fall from ladder), neurogenic bladder requiring self cath, recurrent ESBL UTI, diabetes type 2, hypertension, chronic right buttock pressure ulcer. He was recently hospitalized here in June for complicated UTI due to Klebsiella pneumonia, Morganella morganii and was discharged on Cipro.   OT comments  Now s/p R hip disarticulation. Pt requiring significant assist to come to sitting EOB and then min guard-min A at EOB with BUE support and Mod A with unilateral UE support. Wife present and supportive. Goals remain appropriate, thus, however, re-eval due to significant change in functional status. Udating discharge recommendation to inpatient rehabilitation >3 hours to optimize balance, transfers, and ADL after surgery as pt with significant change in sitting balance.    Recommendations for follow up therapy are one component of a multi-disciplinary discharge planning process, led by the attending physician.  Recommendations may be updated based on patient status, additional functional criteria and insurance authorization.    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 Pt requiring BUE support to maintain static sitting and total A to manage turning BLE OOB and repositioning hips.    Transfers                   General transfer comment: declined OOB     Balance Overall balance assessment: Needs assistance Sitting-balance support: Bilateral upper extremity supported, Feet supported, No upper extremity supported, Single extremity supported Sitting balance-Leahy Scale: Poor Sitting balance - Comments: Pt requires BUE support. Mod A for unilateral tasks EOB                                   ADL either performed or assessed with clinical judgement   ADL Overall ADL's : Needs assistance/impaired     Grooming: Wash/dry face;Minimal assistance;Sitting Grooming Details (indicate cue type and reason): At EOB requiring min A to maintain sitting balance this session                             Functional mobility during ADLs: Maximal assistance;+2 for physical assistance;+2 for safety/equipment (supine to EOB and back)      Extremity/Trunk Assessment Upper Extremity Assessment Upper Extremity  Assessment: Generalized weakness (BUE trembling with supporting self EOB)   Lower Extremity Assessment Lower Extremity Assessment: Defer to PT evaluation        Vision   Vision Assessment?: No apparent visual deficits   Perception     Praxis      Cognition  Arousal/Alertness: Awake/alert Behavior During Therapy: Flat affect Overall Cognitive Status: Difficult to assess                                 General Comments: Pt benefitting from encouragement for mobility after having had surgery yesterday.        Exercises      Shoulder Instructions       General Comments VSS    Pertinent Vitals/ Pain       Pain Assessment Pain Assessment: Faces Faces Pain Scale: Hurts even more Pain Location: bil hands with IV placement. Pain Descriptors / Indicators: Guarding Pain Intervention(s): Limited activity within patient's tolerance, Monitored during session  Home Living                                          Prior Functioning/Environment              Frequency  Min 2X/week        Progress Toward Goals  OT Goals(current goals can now be found in the care plan section)  Progress towards OT goals: Progressing toward goals  Acute Rehab OT Goals Patient Stated Goal: none stated OT Goal Formulation: With patient/family Time For Goal Achievement: 05/08/23 Potential to Achieve Goals: Good ADL Goals Pt/caregiver will Perform Home Exercise Program: Increased strength;Right Upper extremity;Left upper extremity;With written HEP provided;Independently Additional ADL Goal #1: Pt will transition supine to sit EOB with no more than mod assist in preparation for transfers to his wheelchair. Additional ADL Goal #2: Pt/spouse will return demonstrate pressure relief techniques independently while sitting in wheelchair. Additional ADL Goal #3: Pt will transfer to the wheelchari via sliding board with no more than min assist after positioning.  Plan Discharge plan needs to be updated;Frequency needs to be updated    Co-evaluation    PT/OT/SLP Co-Evaluation/Treatment: Yes Reason for Co-Treatment: Complexity of the patient's impairments (multi-system involvement);For patient/therapist safety;To address  functional/ADL transfers PT goals addressed during session: Mobility/safety with mobility;Balance OT goals addressed during session: ADL's and self-care      AM-PAC OT "6 Clicks" Daily Activity     Outcome Measure   Help from another person eating meals?: None Help from another person taking care of personal grooming?: A Little Help from another person toileting, which includes using toliet, bedpan, or urinal?: Total Help from another person bathing (including washing, rinsing, drying)?: A Lot Help from another person to put on and taking off regular upper body clothing?: A Lot Help from another person to put on and taking off regular lower body clothing?: Total 6 Click Score: 13    End of Session    OT Visit Diagnosis: Muscle weakness (generalized) (M62.81)   Activity Tolerance Patient tolerated treatment well   Patient Left in bed;with call bell/phone within reach;with family/visitor present;with bed alarm set   Nurse Communication Mobility status        Time: 2440-1027 OT Time Calculation (min): 27 min  Charges: OT General Charges $OT Visit: 1 Visit OT Evaluation $OT Re-eval: 1  Re-eval  Tyler Deis, OTR/L [facility] Acute Rehabilitation Office: [phone]   Myrla Halsted 05/02/2023, 2:18 PM

## 2023-05-03 ENCOUNTER — Encounter (HOSPITAL_COMMUNITY): Payer: Self-pay | Admitting: Orthopedic Surgery

## 2023-05-03 DIAGNOSIS — B9689 Other specified bacterial agents as the cause of diseases classified elsewhere: Secondary | ICD-10-CM

## 2023-05-03 LAB — CBC
HCT: 30.4 % — ABNORMAL LOW (ref 39.0–52.0)
Hemoglobin: 10.2 g/dL — ABNORMAL LOW (ref 13.0–17.0)
MCH: 29.8 pg (ref 26.0–34.0)
MCHC: 33.6 g/dL (ref 30.0–36.0)
MCV: 88.9 fL (ref 80.0–100.0)
Platelets: 177 10*3/uL (ref 150–400)
RBC: 3.42 MIL/uL — ABNORMAL LOW (ref 4.22–5.81)
RDW: 14.8 % (ref 11.5–15.5)
WBC: 11.7 10*3/uL — ABNORMAL HIGH (ref 4.0–10.5)
nRBC: 0 % (ref 0.0–0.2)

## 2023-05-03 LAB — BASIC METABOLIC PANEL
Anion gap: 7 (ref 5–15)
BUN: 14 mg/dL (ref 6–20)
CO2: 24 mmol/L (ref 22–32)
Calcium: 8.1 mg/dL — ABNORMAL LOW (ref 8.9–10.3)
Chloride: 105 mmol/L (ref 98–111)
Creatinine, Ser: 0.62 mg/dL (ref 0.61–1.24)
GFR, Estimated: >60 mL/min (ref >60)
Glucose, Bld: 95 mg/dL (ref 70–99)
Potassium: 3.7 mmol/L (ref 3.5–5.1)
Sodium: 136 mmol/L (ref 135–145)

## 2023-05-03 LAB — AEROBIC/ANAEROBIC CULTURE W GRAM STAIN (SURGICAL/DEEP WOUND)

## 2023-05-03 LAB — GLUCOSE, CAPILLARY
Glucose-Capillary: 88 mg/dL (ref 70–99)
Glucose-Capillary: 173 mg/dL — ABNORMAL HIGH (ref 70–99)
Glucose-Capillary: 162 mg/dL — ABNORMAL HIGH (ref 70–99)
Glucose-Capillary: 187 mg/dL — ABNORMAL HIGH (ref 70–99)

## 2023-05-03 LAB — MISC LABCORP TEST (SEND OUT)
LabCorp test name: 60201
Labcorp test code: 9985

## 2023-05-03 LAB — MAGNESIUM: Magnesium: 1.6 mg/dL — ABNORMAL LOW (ref 1.7–2.4)

## 2023-05-03 LAB — PHOSPHORUS: Phosphorus: 2.4 mg/dL — ABNORMAL LOW (ref 2.5–4.6)

## 2023-05-03 MED ORDER — MAGNESIUM SULFATE 2 GM/50ML IV SOLN
2.0000 g | Freq: Once | INTRAVENOUS | Status: AC
  Administered 2023-05-03: 2 g via INTRAVENOUS
  Filled 2023-05-03: qty 50

## 2023-05-03 NOTE — Progress Notes (Signed)
Inpatient Rehab Coordinator Note:  I met with patient and wife at bedside to discuss CIR recommendations and goals/expectations of CIR stay.  We reviewed 3 hrs/day of therapy, physician follow up, and average length of stay 2 weeks (dependent upon progress) with goals of mod I.  Patient and wife are interested in seeing if he qualifies for medicaid. Email sent to inquire about this.  Wife can provide 24/7 assistance as she is his caregiver at baseline. Patient has only had PT/OT 1x so far since surgery, so will wait to see his progression and tolerance. Continue to follow.  Rehab Admissons Coordinator [place], [place], [place] [phone]

## 2023-05-03 NOTE — Plan of Care (Signed)
  Problem: Health Behavior/Discharge Planning: Goal: Ability to manage health-related needs will improve Outcome: Progressing   Problem: Clinical Measurements: Goal: Ability to maintain clinical measurements within normal limits will improve Outcome: Progressing   Problem: Activity: Goal: Risk for activity intolerance will decrease Outcome: Progressing   Problem: Nutrition: Goal: Adequate nutrition will be maintained Outcome: Progressing   Problem: Coping: Goal: Level of anxiety will decrease Outcome: Progressing   Problem: Pain Managment: Goal: General experience of comfort will improve Outcome: Progressing   Problem: Skin Integrity: Goal: Risk for impaired skin integrity will decrease Outcome: Progressing

## 2023-05-03 NOTE — Progress Notes (Signed)
[facility]   Reason for visit: Follow up on osteomyelitis, septic arthritis  Interval History: s/p hip disarticulation and debridement by Dr. Lajoyce Corners on 05/01/23.  Noted complete destruction of the proximal femur, large abscesses and multiple pockets, abscess exteded over iliac crest into retroperitoneal space.  Day 12 antibiotics  Physical Exam: Constitutional:  Vitals:   05/03/23 1143 05/03/23 1200  BP:  (!) 151/82  Pulse:  100  Resp:  18  Temp: 98.4 F (36.9 C)   SpO2:  99%   patient appears in NAD Respiratory: Normal respiratory effort  Review of Systems: Constitutional: negative for fevers and chills  Lab Results  Component Value Date   WBC 11.7 (H) 05/03/2023   HGB 10.2 (L) 05/03/2023   HCT 30.4 (L) 05/03/2023   MCV 88.9 05/03/2023   PLT 177 05/03/2023    Lab Results  Component Value Date   CREATININE 0.62 05/03/2023   BUN 14 05/03/2023   NA 136 05/03/2023   K 3.7 05/03/2023   CL 105 05/03/2023   CO2 24 05/03/2023    Lab Results  Component Value Date   ALT 58 (H) 04/30/2023   AST 43 (H) 04/30/2023   ALKPHOS 96 04/30/2023     Microbiology: Recent Results (from the past 240 hour(s))  Body fluid culture w Gram Stain     Status: None (Preliminary result)   Collection Time: 04/27/23  2:35 PM   Specimen: Joint, Right Hip; Synovial Fluid  Result Value Ref Range Status   Specimen Description SYNOVIAL  Final   Special Requests RIGHT HIP  Final   Gram Stain   Final    ABUNDANT WBC PRESENT,BOTH PMN AND MONONUCLEAR RARE GRAM NEGATIVE RODS CRITICAL RESULT CALLED TO, READ BACK BY AND VERIFIED WITH: RN Shawnie Dapper ON 04/27/23 @ 1702 BY DRT    Culture   Final    FEW ESCHERICHIA COLI Confirmed Extended Spectrum Beta-Lactamase Producer (ESBL).  In bloodstream infections from ESBL organisms, carbapenems are preferred over piperacillin/tazobactam. They are shown to have a lower risk of mortality. Sent to [facility] for further  susceptibility testing. Performed at [facility], [address] [place], [place] [address]    Report Status PENDING  Incomplete   Organism ID, Bacteria ESCHERICHIA COLI  Final      Susceptibility   Escherichia coli - MIC*    AMPICILLIN >=32 RESISTANT Resistant     CEFEPIME 1 SENSITIVE Sensitive     CEFTAZIDIME RESISTANT Resistant     CEFTRIAXONE 32 RESISTANT Resistant     CIPROFLOXACIN >=4 RESISTANT Resistant     GENTAMICIN <=1 SENSITIVE Sensitive     IMIPENEM <=0.25 SENSITIVE Sensitive     TRIMETH/SULFA >=320 RESISTANT Resistant     AMPICILLIN/SULBACTAM >=32 RESISTANT Resistant     PIP/TAZO 8 SENSITIVE Sensitive     * FEW ESCHERICHIA COLI  Surgical pcr screen     Status: None   Collection Time: 04/29/23  8:42 PM   Specimen: Nasal Mucosa; Nasal Swab  Result Value Ref Range Status   MRSA, PCR NEGATIVE NEGATIVE Final   Staphylococcus aureus NEGATIVE NEGATIVE Final    Comment: (NOTE) The Xpert SA Assay (FDA approved for NASAL specimens in patients 57 years of age and older), is one component of a comprehensive surveillance program. It is not intended to diagnose infection nor to guide or monitor treatment. Performed at [facility], [address], [place], [place] [address]   Aerobic/Anaerobic Culture w Gram Stain (  surgical/deep wound)     Status: None (Preliminary result)   Collection Time: 05/01/23  9:01 AM   Specimen: Path fluid; Body Fluid  Result Value Ref Range Status   Specimen Description ABSCESS  Final   Special Requests Right hip  Final   Gram Stain   Final    FEW WBC SEEN NO ORGANISMS SEEN Performed at [facility], [address] [place], [place] [address]    Culture   Final    RARE GRAM NEGATIVE RODS IDENTIFICATION AND SUSCEPTIBILITIES TO FOLLOW NO ANAEROBES ISOLATED; CULTURE IN PROGRESS FOR 5 DAYS    Report Status PENDING  Incomplete    Impression/Plan:  1. Hip osteomyelitis - s/p extensive debridement.  Good gross margins noted  though some purulence debrided from over the iliac crest and retroperitoneal space.   Culture growing GNRs which I suspect to be the same E coli as the aspiration.   Will plan on prolonged IV antibiotics for 4 weeks from now to assure clearance and with abscess over the iliac crest.  Will monitor culture but suspect it is the same. Plan for continuing ertapenem at home   Will continue to monitor culture and discuss options with home health.    I have personally spent 54 minutes involved in face-to-face and non-face-to-face activities for this patient on the day of the visit. Professional time spent includes the following activities: Preparing to see the patient (review of tests), Obtaining and/or reviewing separately obtained history (admission/discharge record), Performing a medically appropriate examination and/or evaluation , Ordering medications/tests/procedures, referring and communicating with other health care professionals, Documenting clinical information in the EMR, Independently interpreting results (not separately reported), Communicating results to the patient/family/caregiver, Counseling and educating the patient/family/caregiver and Care coordination (not separately reported).

## 2023-05-03 NOTE — Progress Notes (Signed)
PROGRESS NOTE    John Meza  ZOX:[id number] DOB: 1961/06/10 DOA: 04/23/2023 PCP: Storm Frisk, MD   Brief Narrative:  This 62 year old male with history of paraplegia, neurogenic bladder requiring self cath, recurrent ESBL UTI, diabetes type 2, hypertension, chronic right buttock pressure ulcer who presented with fever. He was recently hospitalized here in June for complicated UTI due to Klebsiella pneumonia, Morganella morganii and was discharged on Cipro. Report of fever with nausea and vomiting. On presentation, lab work showed WBC count of 26,000, lactate of 3. UA suspicious for pyuria. Started on Merem. Continued to be febrile intermittently. Urine culture has not shown any growth. CT abdomen/pelvis showed large collection around the right hip. IR consulted for drain. Orthopedics also consulted. 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  Surgical course complicated by hemorrhagic shock requiring multiple units of blood transfusion and subsequent intubation.  Patient successfully extubated 05/02/2023.  Assessment & Plan:   Principal Problem:   Sepsis (HCC) Active Problems:   Controlled type 2 diabetes mellitus (HCC)   Paraplegia (HCC)   Hyponatremia   Essential hypertension   Complicated UTI (urinary tract infection)   Normocytic anemia   Hyperlipidemia associated with type 2 diabetes mellitus (HCC)   Leukocytosis   Fever of unknown origin   Septic hip (HCC)   Osteomyelitis of right hip (HCC)   Abscess of right hip   Pelvic abscess in male [facility])   Hematoma of hip, initial encounter  Septic arthritis Right hip: -Presented with leukocytosis, fever, tachycardia, dyspnea.  Elevated lactate.  Blood cultures not showing any growth.   -Suspected UTI but urine culture did not show any growth.  Started on [place]. Procalcitonin found to be elevated.   -Continued to spike fever intermittently. Chest x-ray did not show pneumonia. -CT abdomen/pelvis showed large  collection on the right hip.  Right hip area also found to be edematous.   -Patient is paraplegic and does not have any sensation. He did not complain of any pain. -Orthopedics consulted>>appreciate Dr. Lajoyce Corners expertise -aspirate culture = ESBL E coli  -ID consulted>>continue merrem, d/c vanco -Dr. Duda>>hip disarticulation--planned 7/20 -Patient underwent Right hip disarticulation 7/20. Surgical course complicated by hemorrhagic shock requiring multiple units of blood transfusion and subsequent intubation. -Patient successfully extubated later in the day 7/20. -H&H is stable.  PT and OT recommended acute inpatient rehab   Recurrent complicated UTI: History of ESBL E. coli UTI in February 2021, treated for Klebsiella/Morganella UTI a month ago.   Patient has neurogenic bladder, requires self cath.   Currently on Foley cath.   Plan from previous hospitalist to potentially discharge with indwelling foley.  -7/12 Urine culture did not show any growth.   Nausea/vomiting:   Continue antiemetics.  -Resolved--tolerating diet.   Diabetes type 2: -04/12/23 A1c of 7.  On Glucophage, Januvia at home.   -Currently on sliding scale - basal added.   Hypertension:  -HCTZ discontinued for hyponatremia. -arb on hold as well   Hyperlipidemia: Continue Lipitor   Paraplegia: On wheelchair-bound.  Has neurogenic bladder.     Chronic right buttock pressure ulcer: Wound care following.     Hyponatremia: > Resolved. Looks like this is a chronic problem: On reviewing his previous lab works, he is sodium has been in the range of 120s.   Discontinued  hydrochlorothiazide.  Hold ARB as well.  Monitor.   Serum sodium improved.   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 PO iron every other day H/H stable.  Hypomagnesemia Hypophosphatemia: Replaced.  Continue to monitor  DVT prophylaxis: SCD Code Status:Full code Family Communication: None Disposition Plan:   Status is:  Inpatient Remains inpatient appropriate because: Continued need for inpatient care. Patient underwent Right hip disarticulation 7/20.  Surgical course complicated by hemorrhagic shock requiring multiple units of blood transfusion and subsequent intubation, now successfully extubated. PT/ OT recommended acute inpatient rehab.   Consultants:  ID  Ortho  Procedures: None  Antimicrobials:  Anti-infectives (From admission, onward)    Start     Dose/Rate Route Frequency Ordered Stop   05/01/23 1515  ceFAZolin (ANCEF) IVPB 2g/100 mL premix  Status:  Discontinued        2 g 200 mL/hr over 30 Minutes Intravenous Every 8 hours 05/01/23 1424 05/01/23 1445   05/01/23 0704  ceFAZolin (ANCEF) 2-4 GM/100ML-% IVPB       Note to Pharmacy: Kathrene Bongo D: cabinet override      05/01/23 0704 05/01/23 1914   05/01/23 0700  ceFAZolin (ANCEF) IVPB 2g/100 mL premix  Status:  Discontinued        2 g 200 mL/hr over 30 Minutes Intravenous To ShortStay [facility] 04/30/23 2134 05/01/23 1348   04/30/23 1300  ceFAZolin (ANCEF) IVPB 2g/100 mL premix  Status:  Discontinued        2 g 200 mL/hr over 30 Minutes Intravenous To ShortStay Surgical 04/29/23 2039 04/30/23 2137   04/29/23 1400  meropenem (MERREM) 1 g in sodium chloride 0.9 % 100 mL IVPB        1 g 200 mL/hr over 30 Minutes Intravenous Every 8 hours 04/29/23 0832     04/28/23 1400  meropenem (MERREM) 2 g in sodium chloride 0.9 % 100 mL IVPB  Status:  Discontinued        2 g 280 mL/hr over 30 Minutes Intravenous Every 8 hours 04/28/23 1305 04/29/23 0832   04/27/23 1800  vancomycin (VANCOREADY) IVPB 1500 mg/300 mL  Status:  Discontinued        1,500 mg 150 mL/hr over 120 Minutes Intravenous Every 24 hours 04/26/23 1656 04/28/23 0934   04/26/23 1730  vancomycin (VANCOREADY) IVPB 1750 mg/350 mL        1,750 mg 175 mL/hr over 120 Minutes Intravenous  Once 04/26/23 1656 04/26/23 1922   04/24/23 1100  meropenem (MERREM) 1 g in sodium chloride 0.9 % 100 mL  IVPB  Status:  Discontinued        1 g 200 mL/hr over 30 Minutes Intravenous Every 8 hours 04/24/23 0953 04/28/23 1305   04/23/23 0445  ceFEPIme (MAXIPIME) 2 g in sodium chloride 0.9 % 100 mL IVPB  Status:  Discontinued        2 g 200 mL/hr over 30 Minutes Intravenous  Once 04/23/23 0438 04/23/23 0443   04/23/23 0445  meropenem (MERREM) 1 g in sodium chloride 0.9 % 100 mL IVPB        1 g 200 mL/hr over 30 Minutes Intravenous  Once 04/23/23 0443 04/23/23 0550      Subjective: Patient was seen and examined at bed side. Overnight event noted.  Patient underwent right hip disarticulation.  POD #3 Patient successfully extubated yesterday.  He reports pain is reasonably controlled.  Objective: Vitals:   05/03/23 0700 05/03/23 0748 05/03/23 0800 05/03/23 0900  BP: 122/69  (!) 140/78 139/74  Pulse: 82  (!) 105 92  Resp: 16  15 12   Temp:  98.2 F (36.8 C)    TempSrc:  Oral  SpO2: 100%  96% 100%  Weight:      Height:        Intake/Output Summary (Last 24 hours) at 05/03/2023 1057 Last data filed at 05/03/2023 0917 Gross per 24 hour  Intake 1860.39 ml  Output 4300 ml  Net -2439.61 ml   Filed Weights   05/01/23 0709 05/01/23 1447  Weight: 87 kg 72.4 kg    Examination:  General exam: Appears comfortable, not in any acute distress. Respiratory system: Clear to auscultation. Respiratory effort normal. [id number] Cardiovascular system: S1 & S2 heard, RRR.  No pedal edema. Gastrointestinal system: Abdomen is non distended, soft and non tender. Normal bowel sounds heard. Central nervous system: Alert and oriented x 3, Extremities: RLE swelling, wound VAC noted. Skin: No rashes, lesions or ulcers Psychiatry: No psychosis, no SI no HI.   Data Reviewed: I have personally reviewed following labs and imaging studies  CBC: Recent Labs  Lab 04/29/23 0131 04/30/23 1445 04/30/23 2232 05/01/23 1301 05/01/23 1326 05/01/23 1535 05/02/23 0540 05/03/23 0400  WBC 12.1* 14.8*  --   --    --  20.8* 11.7* 11.7*  NEUTROABS  --  11.9*  --   --   --  17.7*  --   --   HGB 7.2* 8.2*   < > 11.2* 10.2* 10.6* 10.4* 10.2*  HCT 22.6* 25.1*   < > 33.0* 30.0* 31.0* 30.3* 30.4*  MCV 85.9 87.5  --   --   --  89.6 87.1 88.9  PLT 428* 457*  --   --   --  104* 128* 177   < > = values in this interval not displayed.   Basic Metabolic Panel: Recent Labs  Lab 04/29/23 0131 04/30/23 1445 05/01/23 1148 05/01/23 1301 05/01/23 1326 05/01/23 1535 05/02/23 0540 05/03/23 0400  NA 128* 132*   < > 136 137 136 134* 136  K 3.8 3.5   < > 4.2 3.9 3.8 3.4* 3.7  CL 101 103  --   --   --  106 106 105  CO2 19* 22  --   --   --  19* 23 24  GLUCOSE 215* 198*  --   --   --  260* 113* 95  BUN 31* 26*  --   --   --  19 12 14   CREATININE 0.89 0.79  --   --   --  0.69 0.57* 0.62  CALCIUM 8.0* 8.4*  --   --   --  8.6* 8.0* 8.1*  MG 1.4* 1.5*  --   --   --  1.1*  --  1.6*  PHOS  --  2.2*  --   --   --  5.8*  --  2.4*   < > = values in this interval not displayed.   GFR: Estimated Creatinine Clearance: 90.7 mL/min (by C-G formula based on SCr of 0.62 mg/dL). Liver Function Tests: Recent Labs  Lab 04/30/23 1445  AST 43*  ALT 58*  ALKPHOS 96  BILITOT 0.2*  PROT 5.2*  ALBUMIN 1.6*   No results for input(s): "LIPASE", "AMYLASE" in the last 168 hours. No results for input(s): "AMMONIA" in the last 168 hours. Coagulation Profile: No results for input(s): "INR", "PROTIME" in the last 168 hours. Cardiac Enzymes: No results for input(s): "CKTOTAL", "CKMB", "CKMBINDEX", "TROPONINI" in the last 168 hours. BNP (last 3 results) No results for input(s): "PROBNP" in the last 8760 hours. HbA1C: No results for input(s): "HGBA1C" in the last 72 hours.  CBG: Recent Labs  Lab 05/02/23 0748 05/02/23 1250 05/02/23 1703 05/02/23 2146 05/03/23 0746  GLUCAP 108* 154* 146* 142* 88   Lipid Profile: No results for input(s): "CHOL", "HDL", "LDLCALC", "TRIG", "CHOLHDL", "LDLDIRECT" in the last 72 hours. Thyroid  Function Tests: No results for input(s): "TSH", "T4TOTAL", "FREET4", "T3FREE", "THYROIDAB" in the last 72 hours. Anemia Panel: Recent Labs    04/30/23 1445  FOLATE 9.5   Sepsis Labs: No results for input(s): "PROCALCITON", "LATICACIDVEN" in the last 168 hours.  Recent Results (from the past 240 hour(s))  Body fluid culture w Gram Stain     Status: None (Preliminary result)   Collection Time: 04/27/23  2:35 PM   Specimen: Joint, Right Hip; Synovial Fluid  Result Value Ref Range Status   Specimen Description SYNOVIAL  Final   Special Requests RIGHT HIP  Final   Gram Stain   Final    ABUNDANT WBC PRESENT,BOTH PMN AND MONONUCLEAR RARE GRAM NEGATIVE RODS CRITICAL RESULT CALLED TO, READ BACK BY AND VERIFIED WITH: RN Shawnie Dapper ON 04/27/23 @ 1702 BY DRT    Culture   Final    FEW ESCHERICHIA COLI Confirmed Extended Spectrum Beta-Lactamase Producer (ESBL).  In bloodstream infections from ESBL organisms, carbapenems are preferred over piperacillin/tazobactam. They are shown to have a lower risk of mortality. Sent to [facility] for further susceptibility testing. Performed at [facility], [address] [place], [place] [address]    Report Status PENDING  Incomplete   Organism ID, Bacteria ESCHERICHIA COLI  Final      Susceptibility   Escherichia coli - MIC*    AMPICILLIN >=32 RESISTANT Resistant     CEFEPIME 1 SENSITIVE Sensitive     CEFTAZIDIME RESISTANT Resistant     CEFTRIAXONE 32 RESISTANT Resistant     CIPROFLOXACIN >=4 RESISTANT Resistant     GENTAMICIN <=1 SENSITIVE Sensitive     IMIPENEM <=0.25 SENSITIVE Sensitive     TRIMETH/SULFA >=320 RESISTANT Resistant     AMPICILLIN/SULBACTAM >=32 RESISTANT Resistant     PIP/TAZO 8 SENSITIVE Sensitive     * FEW ESCHERICHIA COLI  Surgical pcr screen     Status: None   Collection Time: 04/29/23  8:42 PM   Specimen: Nasal Mucosa; Nasal Swab  Result Value Ref Range Status   MRSA, PCR NEGATIVE NEGATIVE Final   Staphylococcus  aureus NEGATIVE NEGATIVE Final    Comment: (NOTE) The Xpert SA Assay (FDA approved for NASAL specimens in patients 79 years of age and older), is one component of a comprehensive surveillance program. It is not intended to diagnose infection nor to guide or monitor treatment. Performed at [facility], [address], [place], [place] [address]   Aerobic/Anaerobic Culture w Gram Stain (surgical/deep wound)     Status: None (Preliminary result)   Collection Time: 05/01/23  9:01 AM   Specimen: Path fluid; Body Fluid  Result Value Ref Range Status   Specimen Description ABSCESS  Final   Special Requests Right hip  Final   Gram Stain   Final    FEW WBC SEEN NO ORGANISMS SEEN Performed at [facility], [address] [place], [place] [address]    Culture   Final    RARE GRAM NEGATIVE RODS IDENTIFICATION AND SUSCEPTIBILITIES TO FOLLOW NO ANAEROBES ISOLATED; CULTURE IN PROGRESS FOR 5 DAYS    Report Status PENDING  Incomplete   Radiology Studies: No results found.  Scheduled Meds:  vitamin C  1,000 mg Oral Daily  atorvastatin  20 mg Per Tube Q1200   Chlorhexidine Gluconate Cloth  6 each Topical Daily   docusate sodium  100 mg Oral BID   enoxaparin (LOVENOX) injection  40 mg Subcutaneous Q24H   feeding supplement (GLUCERNA SHAKE)  237 mL Oral BID BM   ferrous sulfate  325 mg Oral QODAY   insulin aspart  0-15 Units Subcutaneous TID WC   insulin aspart  0-5 Units Subcutaneous QHS   insulin aspart  3 Units Subcutaneous TID WC   insulin glargine-yfgn  12 Units Subcutaneous Daily   leptospermum manuka honey  1 Application Topical Daily   multivitamin with minerals  1 tablet Oral Daily   nutrition supplement (JUVEN)  1 packet Oral BID BM   pantoprazole  40 mg Oral QHS   polyethylene glycol  17 g Oral Daily   sodium chloride flush  3 mL Intravenous Q12H   zinc sulfate  220 mg Oral Daily   Continuous Infusions:  sodium chloride 75 mL/hr at 05/03/23 0900   magnesium  sulfate bolus IVPB     meropenem (MERREM) IV 1 g (05/03/23 0549)     LOS: 10 days    Time spent: 35 mins    Willeen Niece, MD [facility]   If 7PM-7AM, please contact night-coverage

## 2023-05-03 NOTE — Progress Notes (Signed)
Pt transferred to 3W. V/S/S. Pt's wife at bedside

## 2023-05-03 NOTE — Progress Notes (Signed)
Patient ID: John Meza, male   DOB: [birth year], 62 y.o.   MRN: [id number] Patient's hemoglobin is stable from surgery.  Total of 300 cc in the wound VAC canister no active bleeding.  Patient without complaints this morning.  Discussed with his wife this morning through the interpreter the surgical course that was discussed with his daughter on Saturday.  Discharge planning based on therapy recommendations.

## 2023-05-04 ENCOUNTER — Encounter: Payer: Self-pay | Admitting: Hematology

## 2023-05-04 ENCOUNTER — Other Ambulatory Visit: Payer: Self-pay

## 2023-05-04 LAB — CBC
HCT: 30.8 % — ABNORMAL LOW (ref 39.0–52.0)
Hemoglobin: 10.3 g/dL — ABNORMAL LOW (ref 13.0–17.0)
MCH: 29.6 pg (ref 26.0–34.0)
MCHC: 33.4 g/dL (ref 30.0–36.0)
MCV: 88.5 fL (ref 80.0–100.0)
Platelets: 244 10*3/uL (ref 150–400)
RBC: 3.48 MIL/uL — ABNORMAL LOW (ref 4.22–5.81)
RDW: 14.4 % (ref 11.5–15.5)
WBC: 9.5 10*3/uL (ref 4.0–10.5)
nRBC: 0 % (ref 0.0–0.2)

## 2023-05-04 LAB — MAGNESIUM: Magnesium: 1.7 mg/dL (ref 1.7–2.4)

## 2023-05-04 LAB — GLUCOSE, CAPILLARY
Glucose-Capillary: 120 mg/dL — ABNORMAL HIGH (ref 70–99)
Glucose-Capillary: 148 mg/dL — ABNORMAL HIGH (ref 70–99)
Glucose-Capillary: 135 mg/dL — ABNORMAL HIGH (ref 70–99)
Glucose-Capillary: 151 mg/dL — ABNORMAL HIGH (ref 70–99)

## 2023-05-04 LAB — TYPE AND SCREEN
Unit division: 0
Unit division: 0
Unit division: 0
Unit division: 0
Unit division: 0

## 2023-05-04 LAB — BPAM RBC
Blood Product Expiration Date: 202408232359
Blood Product Expiration Date: 202408232359
Blood Product Expiration Date: 202408232359
Blood Product Expiration Date: 202408242359
Blood Product Expiration Date: 202408252359
ISSUE DATE / TIME: 202407200720
ISSUE DATE / TIME: 202407201010
ISSUE DATE / TIME: 202407201153
ISSUE DATE / TIME: 202407201153
ISSUE DATE / TIME: 202407212035
ISSUE DATE / TIME: 202407221101
Unit Type and Rh: 5100
Unit Type and Rh: 5100
Unit Type and Rh: 5100
Unit Type and Rh: 5100

## 2023-05-04 LAB — AEROBIC/ANAEROBIC CULTURE W GRAM STAIN (SURGICAL/DEEP WOUND)

## 2023-05-04 LAB — SURGICAL PATHOLOGY

## 2023-05-04 LAB — PHOSPHORUS: Phosphorus: 2.5 mg/dL (ref 2.5–4.6)

## 2023-05-04 MED ORDER — ATORVASTATIN CALCIUM 10 MG PO TABS
20.0000 mg | ORAL_TABLET | Freq: Every day | ORAL | Status: DC
  Administered 2023-05-05: 20 mg via ORAL
  Filled 2023-05-04: qty 2

## 2023-05-04 NOTE — Plan of Care (Signed)
  Problem: Education: Goal: Knowledge of General Education information will improve Description: Including pain rating scale, medication(s)/side effects and non-pharmacologic comfort measures Outcome: Progressing   Problem: Nutrition: Goal: Adequate nutrition will be maintained Outcome: Progressing   Problem: Coping: Goal: Level of anxiety will decrease Outcome: Progressing   Problem: Pain Management: Goal: Pain level will decrease with appropriate interventions Outcome: Progressing   Problem: Health Behavior/Discharge Planning: Goal: Ability to manage health-related needs will improve Outcome: Not Progressing   Problem: Skin Integrity: Goal: Risk for impaired skin integrity will decrease Outcome: Not Progressing

## 2023-05-04 NOTE — Progress Notes (Signed)
Patient ID: John Meza, male   DOB: 1961-07-04, 62 y.o.   MRN: [id number] Tissue cultures from surgery are showing E. coli that is consistent with the E. coli from the hip aspiration.  There is no new drainage in the wound VAC canister currently 300 cc with a good suction fit.  Anticipate removing the wound VAC at time of discharge.

## 2023-05-04 NOTE — Progress Notes (Signed)
PHARMACY CONSULT NOTE FOR:  OUTPATIENT  PARENTERAL ANTIBIOTIC THERAPY (OPAT)  Indication: Hip Osteomyelitis  Regimen: Ertapenem 1 gm every 24 hours  End date: 05/29/23  IV antibiotic discharge orders are pended. To discharging provider:  please sign these orders via discharge navigator,  Select New Orders & click on the button choice - Manage This Unsigned Work.     Thank you for allowing pharmacy to be a part of this patient's care.  Sharin Mons, PharmD, BCPS, BCIDP Infectious Diseases Clinical Pharmacist Phone: [phone] 05/04/2023, 3:51 PM

## 2023-05-04 NOTE — Progress Notes (Signed)
Wound vac cannister changed, sanguinous output after PT moved patient to chair.

## 2023-05-04 NOTE — Plan of Care (Signed)
  Problem: Education: Goal: Knowledge of General Education information will improve Description: Including pain rating scale, medication(s)/side effects and non-pharmacologic comfort measures Outcome: Progressing   Problem: Activity: Goal: Risk for activity intolerance will decrease Outcome: Progressing   Problem: Nutrition: Goal: Adequate nutrition will be maintained Outcome: Progressing   Problem: Pain Management: Goal: Pain level will decrease with appropriate interventions Outcome: Progressing   Problem: Health Behavior/Discharge Planning: Goal: Ability to manage health-related needs will improve Outcome: Not Progressing   Problem: Elimination: Goal: Will not experience complications related to urinary retention Outcome: Not Progressing

## 2023-05-04 NOTE — Progress Notes (Signed)
PROGRESS NOTE    John Meza  XBM:[id number] DOB: 12/19/1960 DOA: 04/23/2023 PCP: Storm Frisk, MD   Brief Narrative:  This 62 year old male with history of paraplegia, neurogenic bladder requiring self cath, recurrent ESBL UTI, diabetes type 2, hypertension, chronic right buttock pressure ulcer who presented with fever. He was recently hospitalized here in June for complicated UTI due to Klebsiella pneumonia, Morganella morganii and was discharged on Cipro. Report of fever with nausea and vomiting. On presentation, lab work showed WBC count of 26,000, lactate of 3. UA suspicious for pyuria. Started on Merem. Continued to be febrile intermittently. Urine culture has not shown any growth. CT abdomen/pelvis showed large collection around the right hip. IR consulted for drain. Orthopedics also consulted. 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  Surgical course complicated by hemorrhagic shock requiring multiple units of blood transfusion and subsequent intubation.  Patient successfully extubated 05/02/2023.  Assessment & Plan:   Principal Problem:   Sepsis (HCC) Active Problems:   Controlled type 2 diabetes mellitus (HCC)   Paraplegia (HCC)   Hyponatremia   Essential hypertension   Complicated UTI (urinary tract infection)   Normocytic anemia   Hyperlipidemia associated with type 2 diabetes mellitus (HCC)   Leukocytosis   Fever of unknown origin   Septic hip (HCC)   Osteomyelitis of right hip (HCC)   Abscess of right hip   Pelvic abscess in male [facility])   Hematoma of hip, initial encounter  Septic arthritis Right hip: Right Hip Osteomyelitis: -Presented with leukocytosis, fever, tachycardia, dyspnea.  Elevated lactate.  Blood cultures not showing any growth.   -Suspected UTI but urine culture did not show any growth.  Started on [place]. Procalcitonin found to be elevated.   -Continued to spike fever intermittently. Chest x-ray did not show pneumonia. -CT  abdomen/pelvis showed large collection on the right hip.  Right hip area also found to be edematous.   -Patient is paraplegic and does not have any sensation. He did not complain of any pain. -Orthopedics consulted>>appreciate Dr. Lajoyce Corners expertise -aspirate culture = ESBL E coli  -ID consulted>>continue merrem, d/c vanco -Dr. Duda>>hip disarticulation--planned 7/20 -Patient underwent Right hip disarticulation 7/20. Surgical course complicated by hemorrhagic shock requiring multiple units of blood transfusion and subsequent intubation. -Patient successfully extubated later in the day 7/20. -H&H is stable.  PT and OT recommended acute inpatient rehab -ID recommended 4 weeks of IV antibiotics from now to ensure clearance and with abscess over the iliac crest. -Wound VAC would be removed at the time of discharge.   Recurrent complicated UTI: History of ESBL E. coli UTI in February 2021, treated for Klebsiella/Morganella UTI a month ago.   Patient has neurogenic bladder, requires self cath.   Currently on Foley cath.   Plan from previous hospitalist to potentially discharge with indwelling foley.  -7/12 Urine culture did not show any growth.   Nausea/vomiting:    Continue antiemetics.  -Resolved--tolerating diet.   Diabetes type 2: -04/12/23 A1c of 7.  On Glucophage, Januvia at home.   -Currently on sliding scale - basal added.   Hypertension:  -HCTZ discontinued for hyponatremia. -ARB on hold as well   Hyperlipidemia: Continue Lipitor   Paraplegia: On wheelchair-bound.  Has neurogenic bladder.     Chronic right buttock pressure ulcer: Wound care following.     Hyponatremia: > Resolved. Looks like this is a chronic problem: On reviewing his previous lab works, he is sodium has been in the range of 120s.   Discontinued  hydrochlorothiazide.  Hold ARB as well.  Monitor.   Serum sodium improved.   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 PO iron every other  day H/H stable.  Hypomagnesemia Hypophosphatemia: Replaced.  Continue to monitor  DVT prophylaxis: SCD Code Status:Full code Family Communication: None Disposition Plan:   Status is: Inpatient Remains inpatient appropriate because: Continued need for inpatient care. Patient underwent Right hip disarticulation 7/20.  Surgical course complicated by hemorrhagic shock requiring multiple units of blood transfusion and subsequent intubation, now successfully extubated. PT/ OT recommended acute inpatient rehab. Anticipated discharge home in 1 to 2 days with IV antibiotics   Consultants:  ID  Ortho  Procedures: None  Antimicrobials:  Anti-infectives (From admission, onward)    Start     Dose/Rate Route Frequency Ordered Stop   05/01/23 1515  ceFAZolin (ANCEF) IVPB 2g/100 mL premix  Status:  Discontinued        2 g 200 mL/hr over 30 Minutes Intravenous Every 8 hours 05/01/23 1424 05/01/23 1445   05/01/23 0704  ceFAZolin (ANCEF) 2-4 GM/100ML-% IVPB       Note to Pharmacy: Kathrene Bongo D: cabinet override      05/01/23 0704 05/01/23 1914   05/01/23 0700  ceFAZolin (ANCEF) IVPB 2g/100 mL premix  Status:  Discontinued        2 g 200 mL/hr over 30 Minutes Intravenous To ShortStay Surgical 04/30/23 2134 05/01/23 1348   04/30/23 1300  ceFAZolin (ANCEF) IVPB 2g/100 mL premix  Status:  Discontinued        2 g 200 mL/hr over 30 Minutes Intravenous To ShortStay Surgical 04/29/23 2039 04/30/23 2137   04/29/23 1400  meropenem (MERREM) 1 g in sodium chloride 0.9 % 100 mL IVPB        1 g 200 mL/hr over 30 Minutes Intravenous Every 8 hours 04/29/23 0832     04/28/23 1400  meropenem (MERREM) 2 g in sodium chloride 0.9 % 100 mL IVPB  Status:  Discontinued        2 g 280 mL/hr over 30 Minutes Intravenous Every 8 hours 04/28/23 1305 04/29/23 0832   04/27/23 1800  vancomycin (VANCOREADY) IVPB 1500 mg/300 mL  Status:  Discontinued        1,500 mg 150 mL/hr over 120 Minutes Intravenous Every 24  hours 04/26/23 1656 04/28/23 0934   04/26/23 1730  vancomycin (VANCOREADY) IVPB 1750 mg/350 mL        1,750 mg 175 mL/hr over 120 Minutes Intravenous  Once 04/26/23 1656 04/26/23 1922   04/24/23 1100  meropenem (MERREM) 1 g in sodium chloride 0.9 % 100 mL IVPB  Status:  Discontinued        1 g 200 mL/hr over 30 Minutes Intravenous Every 8 hours 04/24/23 0953 04/28/23 1305   04/23/23 0445  ceFEPIme (MAXIPIME) 2 g in sodium chloride 0.9 % 100 mL IVPB  Status:  Discontinued        2 g 200 mL/hr over 30 Minutes Intravenous  Once 04/23/23 0438 04/23/23 0443   04/23/23 0445  meropenem (MERREM) 1 g in sodium chloride 0.9 % 100 mL IVPB        1 g 200 mL/hr over 30 Minutes Intravenous  Once 04/23/23 0443 04/23/23 0550      Subjective: Patient was seen and examined at bed side. Overnight event noted.  Patient underwent right hip disarticulation.  POD #4 He reports pain is reasonably controlled. He has wound vac.  Objective: Vitals:   05/03/23 2005  05/03/23 2305 05/04/23 0408 05/04/23 0729  BP: (!) 158/83 (!) 148/78 (!) 147/75 (!) 145/79  Pulse: 89 88 82 87  Resp:      Temp: 97.7 F (36.5 C) 98.9 F (37.2 C) 98.9 F (37.2 C) 98.9 F (37.2 C)  TempSrc: Oral   Oral  SpO2: 100% 100% 100% 98%  Weight:      Height:        Intake/Output Summary (Last 24 hours) at 05/04/2023 1124 Last data filed at 05/04/2023 1013 Gross per 24 hour  Intake 804.83 ml  Output 4850 ml  Net -4045.17 ml   Filed Weights   05/01/23 0709 05/01/23 1447  Weight: 87 kg 72.4 kg    Examination:  General exam: Appears comfortable, deconditioned, not in any acute distress. Respiratory system: Clear to auscultation. Respiratory effort normal. RR15 Cardiovascular system: S1 & S2 heard, RRR.  No pedal edema. Gastrointestinal system: Abdomen is non distended, soft and non tender. Normal bowel sounds heard. Central nervous system: Alert and oriented x 3, Extremities: RLE swelling, wound VAC noted. Skin: No rashes,  lesions or ulcers Psychiatry: No psychosis, no SI no HI.   Data Reviewed: I have personally reviewed following labs and imaging studies  CBC: Recent Labs  Lab 04/30/23 1445 04/30/23 2232 05/01/23 1326 05/01/23 1535 05/02/23 0540 05/03/23 0400 05/04/23 0500  WBC 14.8*  --   --  20.8* 11.7* 11.7* 9.5  NEUTROABS 11.9*  --   --  17.7*  --   --   --   HGB 8.2*   < > 10.2* 10.6* 10.4* 10.2* 10.3*  HCT 25.1*   < > 30.0* 31.0* 30.3* 30.4* 30.8*  MCV 87.5  --   --  89.6 87.1 88.9 88.5  PLT 457*  --   --  104* 128* 177 244   < > = values in this interval not displayed.   Basic Metabolic Panel: Recent Labs  Lab 04/29/23 0131 04/30/23 1445 05/01/23 1148 05/01/23 1301 05/01/23 1326 05/01/23 1535 05/02/23 0540 05/03/23 0400 05/04/23 0500  NA 128* 132*   < > 136 137 136 134* 136  --   K 3.8 3.5   < > 4.2 3.9 3.8 3.4* 3.7  --   CL 101 103  --   --   --  106 106 105  --   CO2 19* 22  --   --   --  19* 23 24  --   GLUCOSE 215* 198*  --   --   --  260* 113* 95  --   BUN 31* 26*  --   --   --  19 12 14   --   CREATININE 0.89 0.79  --   --   --  0.69 0.57* 0.62  --   CALCIUM 8.0* 8.4*  --   --   --  8.6* 8.0* 8.1*  --   MG 1.4* 1.5*  --   --   --  1.1*  --  1.6* 1.7  PHOS  --  2.2*  --   --   --  5.8*  --  2.4* 2.5   < > = values in this interval not displayed.   GFR: Estimated Creatinine Clearance: 90.7 mL/min (by C-G formula based on SCr of 0.62 mg/dL). Liver Function Tests: Recent Labs  Lab 04/30/23 1445  AST 43*  ALT 58*  ALKPHOS 96  BILITOT 0.2*  PROT 5.2*  ALBUMIN 1.6*   No results for input(s): "LIPASE", "AMYLASE" in the  last 168 hours. No results for input(s): "AMMONIA" in the last 168 hours. Coagulation Profile: No results for input(s): "INR", "PROTIME" in the last 168 hours. Cardiac Enzymes: No results for input(s): "CKTOTAL", "CKMB", "CKMBINDEX", "TROPONINI" in the last 168 hours. BNP (last 3 results) No results for input(s): "PROBNP" in the last 8760  hours. HbA1C: No results for input(s): "HGBA1C" in the last 72 hours. CBG: Recent Labs  Lab 05/03/23 0746 05/03/23 1140 05/03/23 1553 05/03/23 2148 05/04/23 0610  GLUCAP 88 173* 162* 187* 120*   Lipid Profile: No results for input(s): "CHOL", "HDL", "LDLCALC", "TRIG", "CHOLHDL", "LDLDIRECT" in the last 72 hours. Thyroid Function Tests: No results for input(s): "TSH", "T4TOTAL", "FREET4", "T3FREE", "THYROIDAB" in the last 72 hours. Anemia Panel: No results for input(s): "VITAMINB12", "FOLATE", "FERRITIN", "TIBC", "IRON", "RETICCTPCT" in the last 72 hours.  Sepsis Labs: No results for input(s): "PROCALCITON", "LATICACIDVEN" in the last 168 hours.  Recent Results (from the past 240 hour(s))  Body fluid culture w Gram Stain     Status: None (Preliminary result)   Collection Time: 04/27/23  2:35 PM   Specimen: Joint, Right Hip; Synovial Fluid  Result Value Ref Range Status   Specimen Description SYNOVIAL  Final   Special Requests RIGHT HIP  Final   Gram Stain   Final    ABUNDANT WBC PRESENT,BOTH PMN AND MONONUCLEAR RARE GRAM NEGATIVE RODS CRITICAL RESULT CALLED TO, READ BACK BY AND VERIFIED WITH: RN Shawnie Dapper ON 04/27/23 @ 1702 BY DRT    Culture   Final    FEW ESCHERICHIA COLI Confirmed Extended Spectrum Beta-Lactamase Producer (ESBL).  In bloodstream infections from ESBL organisms, carbapenems are preferred over piperacillin/tazobactam. They are shown to have a lower risk of mortality. Sent to [facility] for further susceptibility testing. Performed at [facility], [address] [place], [place] [address]    Report Status PENDING  Incomplete   Organism ID, Bacteria ESCHERICHIA COLI  Final      Susceptibility   Escherichia coli - MIC*    AMPICILLIN >=32 RESISTANT Resistant     CEFEPIME 1 SENSITIVE Sensitive     CEFTAZIDIME RESISTANT Resistant     CEFTRIAXONE 32 RESISTANT Resistant     CIPROFLOXACIN >=4 RESISTANT Resistant     GENTAMICIN <=1 SENSITIVE Sensitive      IMIPENEM <=0.25 SENSITIVE Sensitive     TRIMETH/SULFA >=320 RESISTANT Resistant     AMPICILLIN/SULBACTAM >=32 RESISTANT Resistant     PIP/TAZO 8 SENSITIVE Sensitive     * FEW ESCHERICHIA COLI  Surgical pcr screen     Status: None   Collection Time: 04/29/23  8:42 PM   Specimen: Nasal Mucosa; Nasal Swab  Result Value Ref Range Status   MRSA, PCR NEGATIVE NEGATIVE Final   Staphylococcus aureus NEGATIVE NEGATIVE Final    Comment: (NOTE) The Xpert SA Assay (FDA approved for NASAL specimens in patients 70 years of age and older), is one component of a comprehensive surveillance program. It is not intended to diagnose infection nor to guide or monitor treatment. Performed at [facility], [address] [place], [place] [address]   Aerobic/Anaerobic Culture w Gram Stain (surgical/deep wound)     Status: None (Preliminary result)   Collection Time: 05/01/23  9:01 AM   Specimen: Path fluid; Body Fluid  Result Value Ref Range Status   Specimen Description ABSCESS  Final   Special Requests Right hip  Final   Gram Stain   Final    FEW WBC SEEN NO  ORGANISMS SEEN Performed at [facility], [address], [place], [place] [address]    Culture   Final    RARE ESCHERICHIA COLI SUSCEPTIBILITIES TO FOLLOW NO ANAEROBES ISOLATED; CULTURE IN PROGRESS FOR 5 DAYS    Report Status PENDING  Incomplete   Radiology Studies: No results found.  Scheduled Meds:  vitamin C  1,000 mg Oral Daily   atorvastatin  20 mg Per Tube Q1200   Chlorhexidine Gluconate Cloth  6 each Topical Daily   docusate sodium  100 mg Oral BID   enoxaparin (LOVENOX) injection  40 mg Subcutaneous Q24H   feeding supplement (GLUCERNA SHAKE)  237 mL Oral BID BM   ferrous sulfate  325 mg Oral QODAY   insulin aspart  0-15 Units Subcutaneous TID WC   insulin aspart  0-5 Units Subcutaneous QHS   insulin aspart  3 Units Subcutaneous TID WC   insulin glargine-yfgn  12 Units Subcutaneous Daily   leptospermum manuka  honey  1 Application Topical Daily   multivitamin with minerals  1 tablet Oral Daily   nutrition supplement (JUVEN)  1 packet Oral BID BM   pantoprazole  40 mg Oral QHS   polyethylene glycol  17 g Oral Daily   sodium chloride flush  3 mL Intravenous Q12H   zinc sulfate  220 mg Oral Daily   Continuous Infusions:  sodium chloride 75 mL/hr at 05/03/23 1600   magnesium sulfate bolus IVPB     meropenem (MERREM) IV 1 g (05/04/23 0601)     LOS: 11 days    Time spent: 35 mins    Willeen Niece, MD [facility]   If 7PM-7AM, please contact night-coverage

## 2023-05-04 NOTE — Progress Notes (Signed)
Physical Therapy Treatment Patient Details Name: John Meza MRN: [id number] DOB: July 09, 1961 Today's Date: 05/04/2023   History of Present Illness Patient is a 62 year old male admitted 7/12 presented with fever, N/V.  UA suspicious for pyuria vs. virus. 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 PMH: paraplegia (fall from ladder), neurogenic bladder requiring self cath, recurrent ESBL UTI, diabetes type 2, hypertension, chronic right buttock pressure ulcer. He was recently hospitalized here in June for complicated UTI due to Klebsiella pneumonia, Morganella morganii and was discharged on Cipro.    PT Comments  Patient progressing up OOB to recliner via lift.  Noted some work on balance though inpatient rehab coordinator in to discuss options for follow up therapies at d/c.  Patient prefers home with HHPT.  Note that he does have lift at home, but needs air mattress for his hospital bed.  And noted wife may need education how to use the lift and pt  needing ambulance transport home.  PT will continue to follow.      Assistance Recommended at Discharge Frequent or constant Supervision/Assistance  If plan is discharge home, recommend the following:  Can travel by private vehicle    A lot of help with bathing/dressing/bathroom;Assistance with cooking/housework;Direct supervision/assist for medications management;Assist for transportation;Help with stairs or ramp for entrance;Two people to help with walking and/or transfers      Equipment Recommendations  Other (comment) (reports has lift at home, needs air mattress for hospital bed)    Recommendations for Other Services       Precautions / Restrictions Precautions Precautions: Fall Precaution Comments: s/p R hip disarticulation 7/20; paraplegia Restrictions Weight Bearing Restrictions: No RLE Weight Bearing: Weight bearing as tolerated LLE Weight Bearing: Weight bearing as tolerated     Mobility  Bed Mobility Overal bed  mobility: Needs Assistance Bed Mobility: Rolling Rolling: Mod assist         General bed mobility comments: hooking with arms for rolling to place lift pad    Transfers Overall transfer level: Needs assistance   Transfers: Bed to chair/wheelchair/BSC             General transfer comment: up to recliner with pt's roho cushion for pressure relief using maximove lift Transfer via Lift Equipment: Maximove  Ambulation/Gait                   Stairs             Wheelchair Mobility     Tilt Bed    Modified Rankin (Stroke Patients Only)       Balance Overall balance assessment: Needs assistance   Sitting balance-Leahy Scale: Poor Sitting balance - Comments: pulling forward in sitting for repositioning with mod A using UE's; needing +2 for scooting back in chair; working on UE strength up in chair                                    Cognition Arousal/Alertness: Awake/alert Behavior During Therapy: Flat affect Overall Cognitive Status: Within Functional Limits for tasks assessed                                          Exercises Other Exercises Other Exercises: applied red t-band to arms of recliner for pt to utlize and demonstrated bicep curls on R UE only with L hand  still feeling pain and edema from IV's    General Comments General comments (skin integrity, edema, etc.): VSS, but noted copious drainage out into wound vac once up in chair; RN in to assess; daughter present but did not want to interpret so used [employer] video Spanish interpreter.  Ottie Glazier from inpatient rehab in to discuss preferences for follow up rehab and pt declined inpatient rehab for home with HHPT reports has a lift at home, but has not used it.  May need caregiver ed for lift      Pertinent Vitals/Pain Pain Assessment Faces Pain Scale: Hurts a little bit Pain Location: L hand from IV's Pain Descriptors / Indicators: Discomfort Pain  Intervention(s): Monitored during session    Home Living                          Prior Function            PT Goals (current goals can now be found in the care plan section) Progress towards PT goals: Progressing toward goals    Frequency    Min 1X/week      PT Plan Discharge plan needs to be updated    Co-evaluation              AM-PAC PT "6 Clicks" Mobility   Outcome Measure  Help needed turning from your back to your side while in a flat bed without using bedrails?: A Lot Help needed moving from lying on your back to sitting on the side of a flat bed without using bedrails?: Total Help needed moving to and from a bed to a chair (including a wheelchair)?: Total Help needed standing up from a chair using your arms (e.g., wheelchair or bedside chair)?: Total Help needed to walk in hospital room?: Total Help needed climbing 3-5 steps with a railing? : Total 6 Click Score: 7    End of Session   Activity Tolerance: Patient tolerated treatment well Patient left: in chair;with call bell/phone within reach;with chair alarm set;with family/visitor present Nurse Communication: Mobility status;Need for lift equipment PT Visit Diagnosis: Muscle weakness (generalized) (M62.81);Other abnormalities of gait and mobility (R26.89)     Time: 1610-9604 PT Time Calculation (min) (ACUTE ONLY): 50 min  Charges:    $Therapeutic Activity: 23-37 mins $Self Care/Home Management: 8-22 PT General Charges $$ ACUTE PT VISIT: 1 Visit                     Sheran Lawless, PT Acute Rehabilitation Services Office:[phone] 05/04/2023    John Meza 05/04/2023, 12:48 PM

## 2023-05-04 NOTE — TOC Initial Note (Addendum)
Transition of Care [facility]) - Initial/Assessment Note    Patient Details  Name: John Meza MRN: [id number] Date of Birth: 1961-06-30  Transition of Care [facility]) CM/SW Contact:    Kingsley Plan, RN Phone Number: 05/04/2023, 2:44 PM  Clinical Narrative:                  Spoke to patient and wife at bedside via [employer] interpreter Issac [phone]   Confirmed address and phone number.   Patient from home with wife and daughters   Also spoke to [facility] Supervisor B. Chandler.   Patient has no insurance. States he applied for medicaid on last hospital visit. Will Medical laboratory scientific officer.   Plan to discharge to home tomorrow with IV ABX. Patient has done IV ABX at home before   Pam with [facility]   will provide teaching to patient wife and daughter prior to discharge.   TOC Supervisor has given approval for LOG for [facility] and HHPT. Cory with [facility] checking to see if he can accept. Patient and wife aware NCM needs to find an accepting agency .   Patient has hospital bed and wheelchair and lift at home already . Asking for air mattress for hospital bed.  NCM called [employer] to see if they carry air mattresses and pricing . They do however, Jermaine stated air mattress needs to come from same company he received hospital bed from.   Discussed with supervisor . Instructed that patient will need to speak to company that provided hospital bed and see if they can set up a payment plan or if company offers any financial assistance.     Patient and wife unsure of company they received bed from. They called family member who was at home and they could not find a name on the bed.   NCM called Jermaine with [employer] back and asked him if he can call wife with pricing . Left message awaiting call back. Vaughan Basta will call wife regarding air mattress   At discharge family can provide transportation home   Gu[place]on with [facility] cannot accept as charity.   Cory with [facility] cannot accepted even  with LOG   TOC Supervisor approved LOG for RN through [employer], will Development worker, community. They do not have PT . Spoke to [place] at [facility] they can accept LOG for [facility] $150.00 per visit . Awaiting to hear from [facility] on number of visits etc . Sent LOG for approval for 5 HHRN visits to [facility] Supervisor   Patient paying for cost of antibiotic  Expected Discharge Plan: Home w Home Health Services Barriers to Discharge: Continued Medical Work up   Patient Goals and CMS Choice Patient states their goals for this hospitalization and ongoing recovery are:: to return to home CMS Medicare.gov Compare Post Acute Care list provided to:: Patient Choice offered to / list presented to : Patient [place] ownership interest in [facility].provided to:: Patient    Expected Discharge Plan and Services   Discharge Planning Services: CM Consult Post Acute Care Choice: Home Health Living arrangements for the past 2 months: Single Family Home                 DME Arranged: N/A (see note)         HH Arranged: RN, PT (see note)          Prior Living Arrangements/Services Living arrangements for the past 2 months: Single Family Home Lives with:: Adult Children, Spouse Patient language and need for  interpreter reviewed:: Yes Do you feel safe going back to the place where you live?: Yes      Need for Family Participation in Patient Care: Yes (Comment) Care giver support system in place?: Yes (comment) Current home services: DME Criminal Activity/Legal Involvement Pertinent to Current Situation/Hospitalization: No - Comment as needed  Activities of Daily Living Home Assistive Devices/Equipment: Wheelchair ADL Screening (condition at time of admission) Patient's cognitive ability adequate to safely complete daily activities?: Yes Is the patient deaf or have difficulty hearing?: No Does the patient have difficulty seeing, even when wearing glasses/contacts?: No Does the patient have  difficulty concentrating, remembering, or making decisions?: No Patient able to express need for assistance with ADLs?: Yes Does the patient have difficulty dressing or bathing?: Yes Independently performs ADLs?: No Communication: Independent Dressing (OT): Needs assistance Is this a change from baseline?: Pre-admission baseline Grooming: Needs assistance Is this a change from baseline?: Pre-admission baseline Feeding: Independent Bathing: Dependent Is this a change from baseline?: Pre-admission baseline Toileting: Dependent Is this a change from baseline?: Pre-admission baseline In/Out Bed: Needs assistance Is this a change from baseline?: Pre-admission baseline Walks in Home: Dependent Is this a change from baseline?: Pre-admission baseline Does the patient have difficulty walking or climbing stairs?: Yes Weakness of Legs: Both Weakness of Arms/Hands: None  Permission Sought/Granted   Permission granted to share information with : Yes, Verbal Permission Granted  Share Information with NAME: wife           Emotional Assessment Appearance:: Appears stated age Attitude/Demeanor/Rapport: Engaged Affect (typically observed): Accepting Orientation: : Oriented to Self, Oriented to Place, Oriented to  Time, Oriented to Situation Alcohol / Substance Use: Not Applicable Psych Involvement: No (comment)  Admission diagnosis:  Sepsis secondary to UTI (HCC) [A41.9, N39.0] Sepsis, due to unspecified organism, unspecified whether acute organ dysfunction present [facility]) [A41.9] Patient Active Problem List   Diagnosis Date Noted   Abscess of right hip 05/01/2023   Pelvic abscess in male [facility]) 05/01/2023   Hematoma of hip, initial encounter 05/01/2023   Septic hip (HCC) 04/28/2023   Osteomyelitis of right hip (HCC) 04/28/2023   Leukocytosis 04/25/2023   Fever of unknown origin 04/25/2023   Sepsis secondary to UTI (HCC) 04/23/2023   Sepsis (HCC) 04/11/2023   History of infection due to  extended spectrum beta lactamase (ESBL) producing bacteria 04/11/2023   AKI (acute kidney injury) (HCC) 04/11/2023   Hyperlipidemia associated with type 2 diabetes mellitus (HCC) 12/01/2022   Dental caries 12/01/2022   Iron deficiency anemia due to chronic blood loss 11/14/2022   Anemia of chronic disease 11/14/2022   Diarrhea 06/23/2022   Medication monitoring encounter 04/21/2022   Normocytic anemia 03/03/2022   Complicated UTI (urinary tract infection) 02/28/2022   Wheelchair dependence 12/23/2020   History of cholecystectomy 11/18/2020   Hyponatremia    Essential hypertension    Hypokalemia    Neurogenic bladder 01/06/2012   Neurogenic bowel 01/06/2012   Paraplegia (HCC) 11/16/2011   Controlled type 2 diabetes mellitus (HCC) 05/26/2007   OBESITY, MODERATE 05/26/2007   PCP:  Storm Frisk, MD Pharmacy:   [facility] [address], [address] [place] [address] Phone: [phone] Fax: [phone]  [facility] [address] [place] [address] Phone: [phone] Fax: [phone]     Social Determinants of Health (SDOH) Social History: SDOH Screenings   Food Insecurity: No Food Insecurity (04/30/2023)  Housing: Low Risk  (04/30/2023)  Transportation  Needs: No Transportation Needs (04/30/2023)  Utilities: Not At Risk (04/30/2023)  Depression (PHQ2-9): Low Risk  (01/27/2023)  Social Connections: Unknown (02/22/2022)   Received from [facility]  Tobacco Use: Medium Risk (05/01/2023)   SDOH Interventions:     Readmission Risk Interventions     No data to display

## 2023-05-04 NOTE — Progress Notes (Signed)
Inpatient Rehabilitation Admissions Coordinator    I met with patient at bedside with therapy and daughter using [employer] interpreter, Lonie Peak . We discussed prior level of care and DME at home. He prefers direct d/c home . Has hospital bed and hoyer lift for which he has used for several months. He states MD discussed need for air mattress at home. I will notify acute team and TOC of his preference. We will not pursue CIR admit a this time.  Ottie Glazier, RN, MSN Rehab Admissions Coordinator [phone] 05/04/2023 11:13 AM

## 2023-05-04 NOTE — Plan of Care (Signed)
  Problem: Education: Goal: Knowledge of General Education information will improve Description: Including pain rating scale, medication(s)/side effects and non-pharmacologic comfort measures Outcome: Progressing   Problem: Health Behavior/Discharge Planning: Goal: Ability to manage health-related needs will improve Outcome: Progressing   Problem: Clinical Measurements: Goal: Ability to maintain clinical measurements within normal limits will improve Outcome: Progressing Goal: Will remain free from infection Outcome: Progressing Goal: Diagnostic test results will improve Outcome: Progressing Goal: Respiratory complications will improve Outcome: Progressing   Problem: Activity: Goal: Risk for activity intolerance will decrease Outcome: Progressing   Problem: Nutrition: Goal: Adequate nutrition will be maintained Outcome: Progressing   Problem: Coping: Goal: Level of anxiety will decrease Outcome: Progressing   Problem: Elimination: Goal: Will not experience complications related to bowel motility Outcome: Progressing Goal: Will not experience complications related to urinary retention Outcome: Progressing   Problem: Pain Managment: Goal: General experience of comfort will improve Outcome: Progressing   Problem: Skin Integrity: Goal: Risk for impaired skin integrity will decrease Outcome: Progressing   Problem: Education: Goal: Knowledge of the prescribed therapeutic regimen will improve Outcome: Progressing Goal: Ability to verbalize activity precautions or restrictions will improve Outcome: Progressing Goal: Understanding of discharge needs will improve Outcome: Progressing   Problem: Activity: Goal: Ability to perform//tolerate increased activity and mobilize with assistive devices will improve Outcome: Progressing   Problem: Clinical Measurements: Goal: Postoperative complications will be avoided or minimized Outcome: Progressing   Problem:  Self-Care: Goal: Ability to meet self-care needs will improve Outcome: Progressing   Problem: Self-Concept: Goal: Ability to maintain and perform role responsibilities to the fullest extent possible will improve Outcome: Progressing   Problem: Pain Management: Goal: Pain level will decrease with appropriate interventions Outcome: Progressing   Problem: Skin Integrity: Goal: Demonstration of wound healing without infection will improve Outcome: Progressing

## 2023-05-05 ENCOUNTER — Encounter: Payer: Self-pay | Admitting: Hematology

## 2023-05-05 ENCOUNTER — Other Ambulatory Visit: Payer: Self-pay

## 2023-05-05 LAB — GLUCOSE, CAPILLARY
Glucose-Capillary: 122 mg/dL — ABNORMAL HIGH (ref 70–99)
Glucose-Capillary: 150 mg/dL — ABNORMAL HIGH (ref 70–99)
Glucose-Capillary: 131 mg/dL — ABNORMAL HIGH (ref 70–99)

## 2023-05-05 MED ORDER — SODIUM CHLORIDE 0.9 % IV SOLN
1.0000 g | INTRAVENOUS | Status: DC
  Administered 2023-05-05: 1000 mg via INTRAVENOUS
  Filled 2023-05-05: qty 1

## 2023-05-05 MED ORDER — SODIUM CHLORIDE 0.9% FLUSH: 10.0000 mL | INTRAVENOUS | Status: DC | PRN

## 2023-05-05 MED ORDER — ERTAPENEM IV (FOR PTA / DISCHARGE USE ONLY): 1.0000 g | INTRAVENOUS | 0 refills | Status: AC

## 2023-05-05 MED ORDER — SODIUM CHLORIDE 0.9% FLUSH
10.0000 mL | Freq: Two times a day (BID) | INTRAVENOUS | Status: DC
  Administered 2023-05-05: 10 mL

## 2023-05-05 MED ORDER — OXYCODONE HCL 5 MG PO TABS
5.0000 mg | ORAL_TABLET | ORAL | 0 refills | Status: AC | PRN
  Filled 2023-05-05: qty 15, 2d supply, fill #0

## 2023-05-05 MED ORDER — HEPARIN SOD (PORK) LOCK FLUSH 100 UNIT/ML IV SOLN
250.0000 [IU] | INTRAVENOUS | Status: AC | PRN
  Administered 2023-05-05: 250 [IU]

## 2023-05-05 MED ORDER — PANTOPRAZOLE SODIUM 40 MG PO TBEC
40.0000 mg | DELAYED_RELEASE_TABLET | Freq: Every day | ORAL | 1 refills | Status: DC
  Filled 2023-05-05: qty 30, 30d supply, fill #0
  Filled 2023-06-21: qty 30, 30d supply, fill #1

## 2023-05-05 NOTE — Significant Event (Signed)
EvRN Ethan reviewed AVS with patient and his family. RN took patient to transportation and assisted patient into vehicle. Patient has all his belongings with him to take home including supplies for wound dressing change. All personal belongings accounted for by patient's spouse and daughter.

## 2023-05-05 NOTE — Discharge Instructions (Signed)
Advised to follow up PCP in one week. Advised to follow up Orthopaedics Dr. Lajoyce Corners in 1 week. Advised to take ertapenem IV through PICC line for 4 weeks. Please remove PICC line after completion of IV antibiotics.

## 2023-05-05 NOTE — Progress Notes (Signed)
Occupational Therapy Treatment Patient Details Name: John Meza MRN: [id number] DOB: [birth year] Today's Date: 05/05/2023   History of present illness Patient is a 62 year old male admitted 7/12 presented with fever, N/V.  UA suspicious for pyuria vs. virus. 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 PMH: paraplegia (fall from ladder), neurogenic bladder requiring self cath, recurrent ESBL UTI, diabetes type 2, hypertension, chronic right buttock pressure ulcer. He was recently hospitalized here in June for complicated UTI due to Klebsiella pneumonia, Morganella morganii and was discharged on Cipro.   OT comments  Pt seen in collaboration with PT to optimize safety and provide education in preparation for family planned discharge home. Focus session on education regarding ADL, mobility, transfers to/from bed/chair, and car transfers. Pt wife and OT/PT assisting with transfers providing min A +2. Pt with one anterior LOB into wife and requiring assist for return to midline. Wife demo good body mechanics. Pt, wife, and daughter educated regarding pressure relief techniques and schedule, encouraged OOB, skin checks, and ADL. Pt/family plan for discharge home this afternoon. If dc home, could benefit from [facility] to optimize safety, however, max education provided today due to pt lacking insurance and unlikely to afford HHOT.    Recommendations for follow up therapy are one component of a multi-disciplinary discharge planning process, led by the attending physician.  Recommendations may be updated based on patient status, additional functional criteria and insurance authorization.    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 Pt has a hospital bed at home.    Transfers Overall transfer level: Needs assistance Equipment used: Sliding board Transfers: Bed to chair/wheelchair/BSC            Lateral/Scoot Transfers: Min assist, +2 safety/equipment, With slide board General transfer comment: Wife leading transfer. She placed slideboard and managed LLE off EOB for pt. Pt and wife able to transition from bed to w/c and back to bed at end of session. PT/OT present for guarding and utilized bed pad for min assist. 1 instance of anterior LOB when returning to bed and required assist from PT and wife to recover.     Balance Overall balance assessment: Needs assistance Sitting-balance support: Bilateral upper extremity supported, Feet supported, No upper extremity supported, Single extremity supported Sitting balance-Leahy Scale: Poor Sitting balance - Comments: Anterior LOB with dynamic movement                                   ADL either performed or assessed with clinical judgement   ADL Overall ADL's : Needs assistance/impaired  Lower Body Dressing: With caregiver independent assisting Lower Body Dressing Details (indicate cue type and reason): wife donned sock Toilet Transfer: Minimal assistance;+2 for physical assistance;+2 for safety/equipment;With caregiver independent  assisting Toilet Transfer Details (indicate cue type and reason): Wife/OT assisting with slide board transfer to wheelchair. Pt and wife have good technique and safe use of transfer board. Wife demo good body mechanics while assisting           General ADL Comments: education regarding compression relief in bed and chair. pt chair arms wobbly with weightbearing into them so pushing up from wheels with brakes on. Unable to fully clear bottom but with good relief of weight as OT easily able to remove slide board from under pt after transfer. Additionally encouraged shifting weight side to side for pressure relief.    Extremity/Trunk Assessment Upper Extremity Assessment Upper Extremity Assessment: Generalized weakness (cont BUE tremor with transfer, but less pronounced this session)   Lower Extremity Assessment Lower Extremity Assessment: Defer to PT evaluation        Vision       Perception     Praxis      Cognition Arousal/Alertness: Awake/alert Behavior During Therapy: WFL for tasks assessed/performed Overall Cognitive Status: Within Functional Limits for tasks assessed                                          Exercises      Shoulder Instructions       General Comments Education provided about positioning, options for pressure relief positions/activities. Encouraged pt to start utilizing w/c more as tolerated.    Pertinent Vitals/ Pain       Pain Assessment Pain Assessment: Faces Faces Pain Scale: No hurt Pain Intervention(s): Limited activity within patient's tolerance, Monitored during session  Home Living                                          Prior Functioning/Environment              Frequency  Min 2X/week        Progress Toward Goals  OT Goals(current goals can now be found in the care plan section)  Progress towards OT goals: Progressing toward goals  Acute Rehab OT Goals Patient Stated Goal: go  home OT Goal Formulation: With patient/family Time For Goal Achievement: 05/08/23 Potential to Achieve Goals: Good ADL Goals Pt/caregiver will Perform Home Exercise Program: Increased strength;Right Upper extremity;Left upper extremity;With written HEP provided;Independently Additional ADL Goal #1: Pt will transition supine to sit EOB with no more than mod assist in preparation for transfers to his wheelchair. Additional ADL Goal #2: Pt/spouse will return demonstrate pressure relief techniques independently while sitting in wheelchair. Additional ADL Goal #3: Pt will transfer to the wheelchari via sliding board with no more than min assist after positioning.  Plan Frequency remains appropriate;Discharge plan remains appropriate    Co-evaluation    PT/OT/SLP Co-Evaluation/Treatment: Yes Reason for Co-Treatment: Complexity of the patient's impairments (multi-system involvement);For patient/therapist safety;To address functional/ADL transfers PT goals addressed during session: Mobility/safety with mobility;Balance OT goals addressed during session: ADL's and self-care      AM-PAC OT "6 Clicks" Daily Activity     Outcome Measure   Help from another person eating meals?: None Help from another  person taking care of personal grooming?: A Little Help from another person toileting, which includes using toliet, bedpan, or urinal?: Total Help from another person bathing (including washing, rinsing, drying)?: A Lot Help from another person to put on and taking off regular upper body clothing?: A Lot Help from another person to put on and taking off regular lower body clothing?: Total 6 Click Score: 13    End of Session Equipment Utilized During Treatment: Other (comment) (slide board and wheelchair)  OT Visit Diagnosis: Muscle weakness (generalized) (M62.81)   Activity Tolerance Patient tolerated treatment well   Patient Left in bed;with call bell/phone within reach;with family/visitor  present;with bed alarm set   Nurse Communication Mobility status        Time: 6578-4696 OT Time Calculation (min): 35 min  Charges: OT General Charges $OT Visit: 1 Visit OT Treatments $Self Care/Home Management : 8-22 mins  Myrla Halsted, OTD, OTR/L [facility] Acute Rehabilitation Office: [phone]   Myrla Halsted 05/05/2023, 4:28 PM

## 2023-05-05 NOTE — Progress Notes (Signed)
Peripherally Inserted Central Catheter Placement  The IV Nurse has discussed with the patient and/or persons authorized to consent for the patient, the purpose of this procedure and the potential benefits and risks involved with this procedure.  The benefits include less needle sticks, lab draws from the catheter, and the patient may be discharged home with the catheter. Risks include, but not limited to, infection, bleeding, blood clot (thrombus formation), and puncture of an artery; nerve damage and irregular heartbeat and possibility to perform a PICC exchange if needed/ordered by physician.  Alternatives to this procedure were also discussed.  Bard Power PICC patient education guide, fact sheet on infection prevention and patient information card has been provided to patient /or left at bedside.    PICC Placement Documentation  PICC Single Lumen 05/05/23 Right Brachial 38 cm 0 cm (Active)  Indication for Insertion or Continuance of Line Home intravenous therapies (PICC only) 05/05/23 1022  Exposed Catheter (cm) 0 cm 05/05/23 1022  Site Assessment Clean, Dry, Intact 05/05/23 1022  Line Status Flushed;Saline locked;Blood return noted 05/05/23 1022  Dressing Type Transparent;Securing device 05/05/23 1022  Dressing Status Antimicrobial disc in place;Clean, Dry, Intact 05/05/23 1022  Dressing Intervention New dressing;Other (Comment) 05/05/23 1022  Dressing Change Due 05/12/23 05/05/23 1022   Explained PICC risk and benefits with daughter on the phone. Patient signed consent.    John Meza 05/05/2023, 10:44 AM

## 2023-05-05 NOTE — Significant Event (Signed)
Patient and family want foley out prior to discharge and return to previous I/O catherizations at home. Dr. Idelle Leech notified and gave verbal order to remove catheter.

## 2023-05-05 NOTE — Progress Notes (Signed)
[facility]   Reason for visit: Follow up on osteomyelitis  Interval History: tolerating well, plan for home IV antibiotics  Physical Exam: Constitutional:  Vitals:   05/05/23 0433 05/05/23 0738  BP: 130/68 132/70  Pulse: 76 79  Resp:  18  Temp: 99 F (37.2 C) 98.6 F (37 C)  SpO2: 98% 100%   patient appears in NAD  Review of Systems: Constitutional: negative for fevers and chills  Lab Results  Component Value Date   WBC 9.5 05/04/2023   HGB 10.3 (L) 05/04/2023   HCT 30.8 (L) 05/04/2023   MCV 88.5 05/04/2023   PLT 244 05/04/2023    Lab Results  Component Value Date   CREATININE 0.62 05/03/2023   BUN 14 05/03/2023   NA 136 05/03/2023   K 3.7 05/03/2023   CL 105 05/03/2023   CO2 24 05/03/2023    Lab Results  Component Value Date   ALT 58 (H) 04/30/2023   AST 43 (H) 04/30/2023   ALKPHOS 96 04/30/2023     Microbiology: Recent Results (from the past 240 hour(s))  Body fluid culture w Gram Stain     Status: None (Preliminary result)   Collection Time: 04/27/23  2:35 PM   Specimen: Joint, Right Hip; Synovial Fluid  Result Value Ref Range Status   Specimen Description SYNOVIAL  Final   Special Requests RIGHT HIP  Final   Gram Stain   Final    ABUNDANT WBC PRESENT,BOTH PMN AND MONONUCLEAR RARE GRAM NEGATIVE RODS CRITICAL RESULT CALLED TO, READ BACK BY AND VERIFIED WITH: RN Shawnie Dapper ON 04/27/23 @ 1702 BY DRT    Culture   Final    FEW ESCHERICHIA COLI Confirmed Extended Spectrum Beta-Lactamase Producer (ESBL).  In bloodstream infections from ESBL organisms, carbapenems are preferred over piperacillin/tazobactam. They are shown to have a lower risk of mortality. Sent to [facility] for further susceptibility testing. Performed at [facility], [address], [place], [place] [address]    Report Status PENDING  Incomplete   Organism ID, Bacteria ESCHERICHIA COLI  Final      Susceptibility   Escherichia coli - MIC*     AMPICILLIN >=32 RESISTANT Resistant     CEFEPIME 1 SENSITIVE Sensitive     CEFTAZIDIME RESISTANT Resistant     CEFTRIAXONE 32 RESISTANT Resistant     CIPROFLOXACIN >=4 RESISTANT Resistant     GENTAMICIN <=1 SENSITIVE Sensitive     IMIPENEM <=0.25 SENSITIVE Sensitive     TRIMETH/SULFA >=320 RESISTANT Resistant     AMPICILLIN/SULBACTAM >=32 RESISTANT Resistant     PIP/TAZO 8 SENSITIVE Sensitive     * FEW ESCHERICHIA COLI  Surgical pcr screen     Status: None   Collection Time: 04/29/23  8:42 PM   Specimen: Nasal Mucosa; Nasal Swab  Result Value Ref Range Status   MRSA, PCR NEGATIVE NEGATIVE Final   Staphylococcus aureus NEGATIVE NEGATIVE Final    Comment: (NOTE) The Xpert SA Assay (FDA approved for NASAL specimens in patients 83 years of age and older), is one component of a comprehensive surveillance program. It is not intended to diagnose infection nor to guide or monitor treatment. Performed at [facility], [address], [place], [place] [address]   Aerobic/Anaerobic Culture w Gram Stain (surgical/deep wound)     Status: None (Preliminary result)   Collection Time: 05/01/23  9:01 AM   Specimen: Path fluid; Body Fluid  Result Value Ref Range Status  Specimen Description ABSCESS  Final   Special Requests Right hip  Final   Gram Stain   Final    FEW WBC SEEN NO ORGANISMS SEEN Performed at [facility], [address], [place], [place] [address]    Culture   Final    RARE ESCHERICHIA COLI Confirmed Extended Spectrum Beta-Lactamase Producer (ESBL).  In bloodstream infections from ESBL organisms, carbapenems are preferred over piperacillin/tazobactam. They are shown to have a lower risk of mortality. NO ANAEROBES ISOLATED; CULTURE IN PROGRESS FOR 5 DAYS    Report Status PENDING  Incomplete   Organism ID, Bacteria ESCHERICHIA COLI  Final      Susceptibility   Escherichia coli - MIC*    AMPICILLIN >=32 RESISTANT Resistant     CEFEPIME 1 SENSITIVE Sensitive      CEFTAZIDIME RESISTANT Resistant     CEFTRIAXONE 32 RESISTANT Resistant     CIPROFLOXACIN >=4 RESISTANT Resistant     GENTAMICIN <=1 SENSITIVE Sensitive     IMIPENEM <=0.25 SENSITIVE Sensitive     TRIMETH/SULFA >=320 RESISTANT Resistant     AMPICILLIN/SULBACTAM >=32 RESISTANT Resistant     PIP/TAZO >=128 RESISTANT Resistant     * RARE ESCHERICHIA COLI    Impression/Plan:  1. Osteomyelitis, s/p right hip disarticulation - E coli ESBL in culture noted in aspiration and operative culture.  He did have some abscess noted and debrided at the area of the iliac crest so will continue with antibiotics.   Omadacycline sensitivities requested  Diagnosis: osteomyelitis  Culture Result: E coli, ESBL  Allergies  Allergen Reactions   Macrobid [Nitrofurantoin] Itching and Rash    OPAT Orders Discharge antibiotics to be given via PICC line Discharge antibiotics: ertapenem 1 gram every 24 hours IV Per pharmacy protocol yes Duration: 4 weeks End Date: 05/29/23  [facility] Care Per Protocol: yes  Home health RN for IV administration and teaching; PICC line care and labs.    Labs weekly while on IV antibiotics: _x_ CBC with differential __ BMP _x_ CMP _x_ CRP _x_ ESR __ Vancomycin trough __ CK  __ Please pull PIC at completion of IV antibiotics _x_ Please leave PIC in place until doctor has seen patient or been notified  Fax weekly labs to [phone]  Clinic Follow Up Appt: 8/13 at 2:15pm with Dr. Luciana Axe

## 2023-05-05 NOTE — Discharge Summary (Signed)
Physician Discharge Summary  John Meza DDU:[id number] DOB: [birth year] DOA: 04/23/2023  PCP: Storm Frisk, MD  Admit date: 04/23/2023  Discharge date: 05/05/2023  Admitted From: Home.  Disposition:  Home Health Services.  Recommendations for Outpatient Follow-up:  Follow up with PCP in 1-2 weeks. Please obtain BMP/CBC in one week. Advised to follow up Orthopaedics Dr. Lajoyce Corners in 1 week. Advised to Continue  ertapenem IV through PICC line for 4 weeks. Please remove PICC line after completion of IV antibiotics.  Home Health:Home PT/OT Equipment/Devices:PICC line  Discharge Condition: Stable CODE STATUS:Full code Diet recommendation: Heart Healthy  Brief [facility] Course: This 62 year old male with history of paraplegia, neurogenic bladder requiring self cath, recurrent ESBL UTI, diabetes type 2, hypertension, chronic right buttock pressure ulcer who presented with fever. He was recently hospitalized here in June for complicated UTI due to Klebsiella pneumonia, Morganella morganii and was discharged on Cipro. Report of fever with nausea and vomiting. On presentation, lab work showed WBC count of 26,000, lactate of 3. UA suspicious for pyuria. Started on Merem. Continued to be febrile intermittently. Urine culture has not shown any growth. CT abdomen/pelvis showed large collection around the right hip. IR consulted for drain. Orthopedics also consulted. 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 Surgical course complicated by hemorrhagic shock requiring multiple units of blood transfusion and subsequent intubation. Patient successfully extubated 05/02/2023.  Diagnosis consistent with osteomyelitis of the right hip.  Infectious disease recommended to continue ertapenem.  Patient will need PICC line and IV antibiotics for 4 weeks.  Received PICC line and patient will continue ertapenem IV daily for 4 weeks.  Wound Vac removed.  Patient will need follow-up with Dr.  Lajoyce Corners in 2 weeks. Patient is being discharged home.  home health services arranged.   Discharge Diagnoses:  Principal Problem:   Sepsis (HCC) Active Problems:   Controlled type 2 diabetes mellitus (HCC)   Paraplegia (HCC)   Hyponatremia   Essential hypertension   Complicated UTI (urinary tract infection)   Normocytic anemia   Hyperlipidemia associated with type 2 diabetes mellitus (HCC)   Leukocytosis   Fever of unknown origin   Septic hip (HCC)   Osteomyelitis of right hip (HCC)   Abscess of right hip   Pelvic abscess in male [facility])   Hematoma of hip, initial encounter  Septic arthritis Right hip: Right Hip Osteomyelitis: -Presented with leukocytosis, fever, tachycardia, dyspnea.  Elevated lactate.  Blood cultures not showing any growth.   -Suspected UTI but urine culture did not show any growth.  Started on [place]. Procalcitonin found to be elevated.   -Continued to spike fever intermittently. Chest x-ray did not show pneumonia. -CT abdomen/pelvis showed large collection on the right hip.  Right hip area also found to be edematous.   -Patient is paraplegic and does not have any sensation. He did not complain of any pain. -Orthopedics consulted>>appreciate Dr. Lajoyce Corners expertise -aspirate culture = ESBL E coli  -ID consulted>>continue merrem, d/c vanco -Dr. Duda>>hip disarticulation--planned 7/20 -Patient underwent Right hip disarticulation 7/20. Surgical course complicated by hemorrhagic shock requiring multiple units of blood transfusion and subsequent intubation. -Patient successfully extubated later in the day 7/20. -H&H is stable.  PT and OT recommended acute inpatient rehabilitation. -ID recommended 4 weeks of IV antibiotics from now to ensure clearance and with abscess over the iliac crest. -Wound VAC removed at the time of discharge. - Patient has a PICC line.  Patient will get meropenem for 4 weeks.   Recurrent complicated UTI: History  of ESBL E. coli UTI in February 2021,  treated for Klebsiella/Morganella UTI a month ago.   Patient has neurogenic bladder, requires self cath.   Currently on Foley cath.   Plan from previous hospitalist to potentially discharge with indwelling foley.  -7/12 Urine culture did not show any growth.   Nausea/vomiting:    -Resolved--tolerating diet.   Diabetes type 2: -04/12/23 A1c of 7.  On Glucophage, Januvia at home.   -Currently on sliding scale - basal added.   Hypertension:  -HCTZ discontinued for hyponatremia. -ARB on hold as well   Hyperlipidemia: Continue Lipitor   Paraplegia: On wheelchair-bound.  Has neurogenic bladder.     Chronic right buttock pressure ulcer: Wound care following.     Hyponatremia: > Resolved. Looks like this is a chronic problem: On reviewing his previous lab works, he is sodium has been in the range of 120s.   Discontinued  hydrochlorothiazide.  Hold ARB as well.  Monitor.   Serum sodium improved.   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 PO iron every other day H/H stable.   Hypomagnesemia Hypophosphatemia: Replaced.  Continue to monitor  Discharge Instructions  Discharge Instructions     [facility] pharmacist to adjust dose for Vancomycin, Aminoglycosides and other anti-infective therapies as requested by physician.   Complete by: As directed    [facility] to provide Cath Flo 2mg    Complete by: As directed    Administer for PICC line occlusion and as ordered by physician for other access device issues.   Anaphylaxis Kit: Provided to treat any anaphylactic reaction to the medication being provided to the patient if First Dose or when requested by physician   Complete by: As directed    Epinephrine 1mg /ml vial / amp: Administer 0.3mg  (0.63ml) subcutaneously once for moderate to severe anaphylaxis, nurse to call physician and pharmacy when reaction occurs and call 911 if needed for immediate care   Diphenhydramine 50mg /ml IV vial:  Administer 25-50mg  IV/IM PRN for first dose reaction, rash, itching, mild reaction, nurse to call physician and pharmacy when reaction occurs   Sodium Chloride 0.9% NS IV: Administer if needed for hypovolemic blood pressure drop or as ordered by physician after call to physician with anaphylactic reaction   Call MD for:  difficulty breathing, headache or visual disturbances   Complete by: As directed    Call MD for:  persistant dizziness or light-headedness   Complete by: As directed    Call MD for:  persistant nausea and vomiting   Complete by: As directed    Change dressing on IV access line weekly and PRN   Complete by: As directed    Diet - low sodium heart healthy   Complete by: As directed    Diet Carb Modified   Complete by: As directed    Discharge instructions   Complete by: As directed    Advised to follow up PCP in one week. Advised to follow up Orthopaedics Dr. Lajoyce Corners in 1 week. Advised to take ertapenem IV through PICC line for 4 weeks. Please remove PICC line after completion of IV antibiotics.   Discharge wound care:   Complete by: As directed    Follow-up orthopedics.   Flush IV access with Sodium Chloride 0.9% and Heparin 10 units/ml or 100 units/ml   Complete by: As directed    Home infusion instructions - [facility]   Complete by: As directed    Instructions: Flush IV  access with Sodium Chloride 0.9% and Heparin 10units/ml or 100units/ml   Change dressing on IV access line: Weekly and PRN   Instructions Cath Flo 2mg : Administer for PICC Line occlusion and as ordered by physician for other access device   [facility] pharmacist to adjust dose for: Vancomycin, Aminoglycosides and other anti-infective therapies as requested by physician   Increase activity slowly   Complete by: As directed    Method of administration may be changed at the discretion of home infusion pharmacist based upon assessment of the patient and/or caregiver's ability  to self-administer the medication ordered   Complete by: As directed       Allergies as of 05/05/2023       Reactions   Macrobid [nitrofurantoin] Itching, Rash        Medication List     TAKE these medications    atorvastatin 20 MG tablet Commonly known as: LIPITOR Take 1 tablet (20 mg total) by mouth daily at 12 noon.   ertapenem IVPB Commonly known as: INVANZ Inject 1 g into the vein daily for 25 days. Indication:  Hip Osteomyelitis  First Dose: Yes Last Day of Therapy:  05/29/23  Labs - Once weekly:  CBC/D and BMP, Labs - Once weekly: ESR and CRP Method of administration: Mini-Bag Plus / Gravity Method of administration may be changed at the discretion of home infusion pharmacist based upon assessment of the patient and/or caregiver's ability to self-administer the medication ordered.   famotidine 20 MG tablet Commonly known as: Pepcid Take 1 tablet (20 mg total) by mouth 2 (two) times daily.   FeroSul 325 (65 Fe) MG tablet Generic drug: ferrous sulfate Tome 1 tableta (325 mg en total) por va oral diariamente. (Take 1 tablet (325 mg total) by mouth daily.)   Januvia 100 MG tablet Generic drug: sitaGLIPtin Take 1 tablet (100 mg total) by mouth daily.   metFORMIN 1000 MG tablet Commonly known as: GLUCOPHAGE Take 1 tablet (1,000 mg total) by mouth 2 (two) times daily with a meal.   oxyCODONE 5 MG immediate release tablet Commonly known as: Oxy IR/ROXICODONE Take 1-2 tablets (5-10 mg total) by mouth every 4 (four) hours as needed for up to 5 days for moderate pain (pain score 4-6).   pantoprazole 40 MG tablet Commonly known as: PROTONIX Tome 1 tableta (40 mg en total) por va oral antes de acostarse. (Take 1 tablet (40 mg total) by mouth at bedtime.)   valsartan-hydrochlorothiazide 320-25 MG tablet Commonly known as: DIOVAN-HCT Tome 1 tableta por va oral diariamente. (Take 1 tablet by mouth daily.)   Vitamin D (Ergocalciferol) 1.25 MG (50000 UNIT) Caps  capsule Commonly known as: DRISDOL Take 1 capsule (50,000 Units total) by mouth every 7 (seven) days. Please get updated vit D level.               Discharge Care Instructions  (From admission, onward)           Start     Ordered   05/05/23 0000  Change dressing on IV access line weekly and PRN  (Home infusion instructions - [facility] )        05/05/23 1320   05/05/23 0000  Discharge wound care:       Comments: Follow-up orthopedics.   05/05/23 1323            Follow-up Information     [facility]. Schedule an appointment as soon as possible for a  visit.   Why: Call the clinic and schedule a hospital follow up in the next 7-10 days. Contact information: [address] [place] [address] [phone]        [name], MD Follow up in 1 week(s).   Specialty: Orthopedic Surgery Contact information: [address] [place] [address] [phone]                Allergies  Allergen Reactions   Macrobid [Nitrofurantoin] Itching and Rash    Consultations: Orthopaedics Infectious Diseases.   Procedures/Studies: [place] EKG SITE RITE  Result Date: 05/04/2023 If Site Rite image not attached, placement could not be confirmed due to current cardiac rhythm.  IR US Guide Bx Asp/Drain  Result Date: 04/27/2023 INDICATION: Enlarging right hip effusion by imaging EXAM: ULTRASOUND ASPIRATION LARGE RIGHT HIP EFFUSION MEDICATIONS: The patient is currently admitted to the hospital and receiving intravenous antibiotics. The antibiotics were administered within an appropriate time frame prior to the initiation of the procedure. ANESTHESIA/SEDATION: None. COMPLICATIONS: None immediate. PROCEDURE: Informed written consent was obtained from the the patient utilizing a Spanish interpreter after a thorough discussion of the procedural risks, benefits and alternatives. All questions were  addressed. Maximal Sterile Barrier Technique was utilized including caps, mask, sterile gowns, sterile gloves, sterile drape, hand hygiene and skin antiseptic. A timeout was performed prior to the initiation of the procedure. Previous imaging reviewed. Preliminary ultrasound performed. The large right hip effusion was localized and marked for an anterior approach. Under sterile conditions and local anesthesia, an 18 gauge 10 cm access needle was advanced into the large effusion. Needle position confirmed with ultrasound. Syringe aspiration yielded 25 cc exudative fluid. Sample sent for lab analysis and culture. Needle removed. No immediate complication. Patient tolerated procedure well. IMPRESSION: Successful ultrasound-guided right hip joint aspiration. Electronically Signed   By: Judie Petit.  Shick M.D.   On: 04/27/2023 15:09   CT ABDOMEN PELVIS W CONTRAST  Result Date: 04/26/2023 CLINICAL DATA:  Sepsis EXAM: CT ABDOMEN AND PELVIS WITH CONTRAST TECHNIQUE: Multidetector CT imaging of the abdomen and pelvis was performed using the standard protocol following bolus administration of intravenous contrast. RADIATION DOSE REDUCTION: This exam was performed according to the departmental dose-optimization program which includes automated exposure control, adjustment of the mA and/or kV according to patient size and/or use of iterative reconstruction technique. CONTRAST:  75mL OMNIPAQUE IOHEXOL 350 MG/ML SOLN COMPARISON:  CT scan abdomen and pelvis from 03/19/2022 and radiograph right femur from 04/29/2022. FINDINGS: Lower chest: There is trace left pleural effusion. Bilateral lungs are otherwise clear. No right pleural effusion. The heart is normal in size. No pericardial effusion. Hepatobiliary: The liver is normal in size. Non-cirrhotic configuration. No suspicious mass. There is a stable 1.0 x 1.3 cm cyst in the left hepatic lobe, segment 4B. No intrahepatic or extrahepatic bile duct dilation. Gallbladder is surgically  absent. Pancreas: Unremarkable. No pancreatic ductal dilatation or surrounding inflammatory changes. Spleen: Within normal limits. No focal lesion. Adrenals/Urinary Tract: Adrenal glands are unremarkable. No suspicious renal mass. No hydronephrosis. No renal or ureteric calculi. Urinary bladder is decompressed secondary to Foley catheter. There are at least 3 tiny diverticula arising from the left lateral wall. Stomach/Bowel: There is a small sliding hiatal hernia. No disproportionate dilation of the small or large bowel loops. No evidence of abnormal bowel wall thickening or inflammatory changes. The appendix is unremarkable. Vascular/Lymphatic: No ascites or pneumoperitoneum. No abdominal or pelvic lymphadenopathy, by size criteria. No aneurysmal dilation of the major  abdominal arteries. Reproductive: Normal size prostate. Symmetric seminal vesicles. Other: There is mild anasarca. The soft tissues and abdominal wall are otherwise unremarkable. Musculoskeletal: When compared to the available prior examination from 04/29/2022, there is resorption of the right femoral head and neck and portion of the right proximal femur. There is large collection with mild irregular hyperattenuating wall surrounding the right hip joint. Correlate clinically for septic arthritis. There are mild - moderate multilevel degenerative changes in the visualized spine. Posterior spinal fixation of T9 through L1 noted. IMPRESSION: 1. When compared to the recent prior examination from 04/29/2022, there is resorption of the right femoral head, neck and portion of the right proximal femur. There is large collection with mild irregular hyperattenuating wall surrounding the right hip joint. Correlate clinically for septic arthritis. 2. Otherwise, no acute inflammatory process identified within the abdomen or pelvis. 3. Multiple other nonacute observations, as described above. Electronically Signed   By: Jules Schick M.D.   On: 04/26/2023 16:19    DG Chest Port 1 View  Result Date: 04/23/2023 CLINICAL DATA:  62 year old male with possible sepsis. EXAM: PORTABLE CHEST 1 VIEW COMPARISON:  Chest x-ray 04/13/2023. FINDINGS: Lung volumes are low. No consolidative airspace disease. No pleural effusions. No pneumothorax. No pulmonary nodule or mass noted. Pulmonary vasculature and the cardiomediastinal silhouette are within normal limits. Orthopedic fixation hardware in the lower thoracic and lumbar spine partially imaged. Surgical clips project over the right upper quadrant of the abdomen, likely from prior cholecystectomy. IMPRESSION: 1. Low lung volumes without radiographic evidence of acute cardiopulmonary disease. Electronically Signed   By: Trudie Reed M.D.   On: 04/23/2023 05:24   DG Knee Right Port  Result Date: 04/13/2023 CLINICAL DATA:  Swelling EXAM: PORTABLE RIGHT KNEE - 4 VIEW COMPARISON:  X-ray 05/23/2022 FINDINGS: Severe osteopenia. No acute fracture or dislocation. Overall preserved joint spaces. Tiny osteophyte formation. No joint effusion. IMPRESSION: Osteopenia with slight degenerative change. Electronically Signed   By: Karen Kays M.D.   On: 04/13/2023 18:15   DG CHEST PORT 1 VIEW  Result Date: 04/13/2023 CLINICAL DATA:  Shortness of breath EXAM: PORTABLE CHEST 1 VIEW COMPARISON:  04/11/2023 FINDINGS: Underinflation. No consolidation, pneumothorax or effusion. No edema. Normal cardiopericardial silhouette. Overlapping cardiac leads. Fixation hardware along the thoracolumbar spine. Old left-sided rib deformities. IMPRESSION: Underinflation.  No acute cardiopulmonary disease.  Chronic changes. Electronically Signed   By: Karen Kays M.D.   On: 04/13/2023 17:47   DG Chest Port 1 View  Result Date: 04/11/2023 CLINICAL DATA:  Cough EXAM: PORTABLE CHEST 1 VIEW COMPARISON:  Chest x-ray April 25, 2022 FINDINGS: The cardiomediastinal silhouette is unchanged in contour. No focal pulmonary opacity. No pleural effusion or pneumothorax.  The visualized upper abdomen is unremarkable. Partially visualized thoracic fusion hardware. No acute osseous abnormality. IMPRESSION: No acute cardiopulmonary abnormality. Electronically Signed   By: Jacob Moores M.D.   On: 04/11/2023 14:35     Subjective: Patient was seen and examined at bedside.  Overnight events noted.  Patient reports doing much better.  Patient has PICC line.  Patient will continue ertapenem for 4 weeks.   Last of antibiotics May 29, 2023.  Patient is being discharged home.  Discharge Exam: Vitals:   05/05/23 0738 05/05/23 1221  BP: 132/70 132/74  Pulse: 79 85  Resp: 18 18  Temp: 98.6 F (37 C) 98.6 F (37 C)  SpO2: 100% 100%   Vitals:   05/04/23 2304 05/05/23 0433 05/05/23 0738 05/05/23 1221  BP: 138/67 130/68  132/70 132/74  Pulse: 77 76 79 85  Resp:   18 18  Temp: 98.8 F (37.1 C) 99 F (37.2 C) 98.6 F (37 C) 98.6 F (37 C)  TempSrc: Oral Oral Oral Oral  SpO2: 100% 98% 100% 100%  Weight:      Height:        General: Pt is alert, awake, not in acute distress Cardiovascular: RRR, S1/S2 +, no rubs, no gallops Respiratory: CTA bilaterally, no wheezing, no rhonchi Abdominal: Soft, NT, ND, bowel sounds + Extremities: no edema, no cyanosis    The results of significant diagnostics from this hospitalization (including imaging, microbiology, ancillary and laboratory) are listed below for reference.     Microbiology: Recent Results (from the past 240 hour(s))  Body fluid culture w Gram Stain     Status: None (Preliminary result)   Collection Time: 04/27/23  2:35 PM   Specimen: Joint, Right Hip; Synovial Fluid  Result Value Ref Range Status   Specimen Description SYNOVIAL  Final   Special Requests RIGHT HIP  Final   Gram Stain   Final    ABUNDANT WBC PRESENT,BOTH PMN AND MONONUCLEAR RARE GRAM NEGATIVE RODS CRITICAL RESULT CALLED TO, READ BACK BY AND VERIFIED WITH: RN Shawnie Dapper ON 04/27/23 @ 1702 BY DRT    Culture   Final    FEW  ESCHERICHIA COLI Confirmed Extended Spectrum Beta-Lactamase Producer (ESBL).  In bloodstream infections from ESBL organisms, carbapenems are preferred over piperacillin/tazobactam. They are shown to have a lower risk of mortality. Sent to [facility] for further susceptibility testing. Performed at [facility], [address] [place], [place] [address]    Report Status PENDING  Incomplete   Organism ID, Bacteria ESCHERICHIA COLI  Final      Susceptibility   Escherichia coli - MIC*    AMPICILLIN >=32 RESISTANT Resistant     CEFEPIME 1 SENSITIVE Sensitive     CEFTAZIDIME RESISTANT Resistant     CEFTRIAXONE 32 RESISTANT Resistant     CIPROFLOXACIN >=4 RESISTANT Resistant     GENTAMICIN <=1 SENSITIVE Sensitive     IMIPENEM <=0.25 SENSITIVE Sensitive     TRIMETH/SULFA >=320 RESISTANT Resistant     AMPICILLIN/SULBACTAM >=32 RESISTANT Resistant     PIP/TAZO 8 SENSITIVE Sensitive     * FEW ESCHERICHIA COLI  Surgical pcr screen     Status: None   Collection Time: 04/29/23  8:42 PM   Specimen: Nasal Mucosa; Nasal Swab  Result Value Ref Range Status   MRSA, PCR NEGATIVE NEGATIVE Final   Staphylococcus aureus NEGATIVE NEGATIVE Final    Comment: (NOTE) The Xpert SA Assay (FDA approved for NASAL specimens in patients 3 years of age and older), is one component of a comprehensive surveillance program. It is not intended to diagnose infection nor to guide or monitor treatment. Performed at [facility], [address], [place], [place] [address]   Aerobic/Anaerobic Culture w Gram Stain (surgical/deep wound)     Status: None (Preliminary result)   Collection Time: 05/01/23  9:01 AM   Specimen: Path fluid; Body Fluid  Result Value Ref Range Status   Specimen Description ABSCESS  Final   Special Requests Right hip  Final   Gram Stain   Final    FEW WBC SEEN NO ORGANISMS SEEN Performed at [facility], [address], [place], [place] [address]    Culture   Final    RARE  ESCHERICHIA COLI Confirmed Extended Spectrum Beta-Lactamase Producer (ESBL).  In bloodstream infections from ESBL organisms, carbapenems are preferred over piperacillin/tazobactam. They are shown to have a lower risk of mortality. NO ANAEROBES ISOLATED; CULTURE IN PROGRESS FOR 5 DAYS    Report Status PENDING  Incomplete   Organism ID, Bacteria ESCHERICHIA COLI  Final      Susceptibility   Escherichia coli - MIC*    AMPICILLIN >=32 RESISTANT Resistant     CEFEPIME 1 SENSITIVE Sensitive     CEFTAZIDIME RESISTANT Resistant     CEFTRIAXONE 32 RESISTANT Resistant     CIPROFLOXACIN >=4 RESISTANT Resistant     GENTAMICIN <=1 SENSITIVE Sensitive     IMIPENEM <=0.25 SENSITIVE Sensitive     TRIMETH/SULFA >=320 RESISTANT Resistant     AMPICILLIN/SULBACTAM >=32 RESISTANT Resistant     PIP/TAZO >=128 RESISTANT Resistant     * RARE ESCHERICHIA COLI     Labs: BNP (last 3 results) No results for input(s): "BNP" in the last 8760 hours. Basic Metabolic Panel: Recent Labs  Lab 04/29/23 0131 04/30/23 1445 05/01/23 1148 05/01/23 1301 05/01/23 1326 05/01/23 1535 05/02/23 0540 05/03/23 0400 05/04/23 0500  NA 128* 132*   < > 136 137 136 134* 136  --   K 3.8 3.5   < > 4.2 3.9 3.8 3.4* 3.7  --   CL 101 103  --   --   --  106 106 105  --   CO2 19* 22  --   --   --  19* 23 24  --   GLUCOSE 215* 198*  --   --   --  260* 113* 95  --   BUN 31* 26*  --   --   --  19 12 14   --   CREATININE 0.89 0.79  --   --   --  0.69 0.57* 0.62  --   CALCIUM 8.0* 8.4*  --   --   --  8.6* 8.0* 8.1*  --   MG 1.4* 1.5*  --   --   --  1.1*  --  1.6* 1.7  PHOS  --  2.2*  --   --   --  5.8*  --  2.4* 2.5   < > = values in this interval not displayed.   Liver Function Tests: Recent Labs  Lab 04/30/23 1445  AST 43*  ALT 58*  ALKPHOS 96  BILITOT 0.2*  PROT 5.2*  ALBUMIN 1.6*   No results for input(s): "LIPASE", "AMYLASE" in the last 168 hours. No results for input(s): "AMMONIA" in the last 168  hours. CBC: Recent Labs  Lab 04/30/23 1445 04/30/23 2232 05/01/23 1326 05/01/23 1535 05/02/23 0540 05/03/23 0400 05/04/23 0500  WBC 14.8*  --   --  20.8* 11.7* 11.7* 9.5  NEUTROABS 11.9*  --   --  17.7*  --   --   --   HGB 8.2*   < > 10.2* 10.6* 10.4* 10.2* 10.3*  HCT 25.1*   < > 30.0* 31.0* 30.3* 30.4* 30.8*  MCV 87.5  --   --  89.6 87.1 88.9 88.5  PLT 457*  --   --  104* 128* 177 244   < > = values in this interval not displayed.   Cardiac Enzymes: No results for input(s): "CKTOTAL", "CKMB", "CKMBINDEX", "TROPONINI" in the last 168 hours. BNP: Invalid input(s): "POCBNP" CBG: Recent Labs  Lab 05/04/23 1156 05/04/23 1635 05/04/23 1949 05/05/23 0617 05/05/23 1152  GLUCAP 148* 135* 151* 122* 150*   D-Dimer No results for  input(s): "DDIMER" in the last 72 hours. Hgb A1c No results for input(s): "HGBA1C" in the last 72 hours. Lipid Profile No results for input(s): "CHOL", "HDL", "LDLCALC", "TRIG", "CHOLHDL", "LDLDIRECT" in the last 72 hours. Thyroid function studies No results for input(s): "TSH", "T4TOTAL", "T3FREE", "THYROIDAB" in the last 72 hours.  Invalid input(s): "FREET3" Anemia work up No results for input(s): "VITAMINB12", "FOLATE", "FERRITIN", "TIBC", "IRON", "RETICCTPCT" in the last 72 hours. Urinalysis    Component Value Date/Time   COLORURINE YELLOW 04/23/2023 0543   APPEARANCEUR HAZY (A) 04/23/2023 0543   APPEARANCEUR Clear 03/19/2022 1413   LABSPEC 1.014 04/23/2023 0543   PHURINE 5.0 04/23/2023 0543   GLUCOSEU NEGATIVE 04/23/2023 0543   HGBUR NEGATIVE 04/23/2023 0543   BILIRUBINUR NEGATIVE 04/23/2023 0543   BILIRUBINUR negative 04/09/2023 0941   BILIRUBINUR Negative 03/19/2022 1413   KETONESUR NEGATIVE 04/23/2023 0543   PROTEINUR >=300 (A) 04/23/2023 0543   UROBILINOGEN 1.0 04/09/2023 0941   UROBILINOGEN 1.0 02/28/2022 1240   NITRITE NEGATIVE 04/23/2023 0543   LEUKOCYTESUR TRACE (A) 04/23/2023 0543   Sepsis Labs Recent Labs  Lab  05/01/23 1535 05/02/23 0540 05/03/23 0400 05/04/23 0500  WBC 20.8* 11.7* 11.7* 9.5   Microbiology Recent Results (from the past 240 hour(s))  Body fluid culture w Gram Stain     Status: None (Preliminary result)   Collection Time: 04/27/23  2:35 PM   Specimen: Joint, Right Hip; Synovial Fluid  Result Value Ref Range Status   Specimen Description SYNOVIAL  Final   Special Requests RIGHT HIP  Final   Gram Stain   Final    ABUNDANT WBC PRESENT,BOTH PMN AND MONONUCLEAR RARE GRAM NEGATIVE RODS CRITICAL RESULT CALLED TO, READ BACK BY AND VERIFIED WITH: RN Shawnie Dapper ON 04/27/23 @ 1702 BY DRT    Culture   Final    FEW ESCHERICHIA COLI Confirmed Extended Spectrum Beta-Lactamase Producer (ESBL).  In bloodstream infections from ESBL organisms, carbapenems are preferred over piperacillin/tazobactam. They are shown to have a lower risk of mortality. Sent to [facility] for further susceptibility testing. Performed at [facility], [address] [place], [place] [address]    Report Status PENDING  Incomplete   Organism ID, Bacteria ESCHERICHIA COLI  Final      Susceptibility   Escherichia coli - MIC*    AMPICILLIN >=32 RESISTANT Resistant     CEFEPIME 1 SENSITIVE Sensitive     CEFTAZIDIME RESISTANT Resistant     CEFTRIAXONE 32 RESISTANT Resistant     CIPROFLOXACIN >=4 RESISTANT Resistant     GENTAMICIN <=1 SENSITIVE Sensitive     IMIPENEM <=0.25 SENSITIVE Sensitive     TRIMETH/SULFA >=320 RESISTANT Resistant     AMPICILLIN/SULBACTAM >=32 RESISTANT Resistant     PIP/TAZO 8 SENSITIVE Sensitive     * FEW ESCHERICHIA COLI  Surgical pcr screen     Status: None   Collection Time: 04/29/23  8:42 PM   Specimen: Nasal Mucosa; Nasal Swab  Result Value Ref Range Status   MRSA, PCR NEGATIVE NEGATIVE Final   Staphylococcus aureus NEGATIVE NEGATIVE Final    Comment: (NOTE) The Xpert SA Assay (FDA approved for NASAL specimens in patients 83 years of age and older), is one component of a  comprehensive surveillance program. It is not intended to diagnose infection nor to guide or monitor treatment. Performed at [facility], [address] [place], [place] [address]   Aerobic/Anaerobic Culture w Gram Stain (surgical/deep wound)     Status: None (Preliminary result)  Collection Time: 05/01/23  9:01 AM   Specimen: Path fluid; Body Fluid  Result Value Ref Range Status   Specimen Description ABSCESS  Final   Special Requests Right hip  Final   Gram Stain   Final    FEW WBC SEEN NO ORGANISMS SEEN Performed at [facility], [address], [place], [place] [address]    Culture   Final    RARE ESCHERICHIA COLI Confirmed Extended Spectrum Beta-Lactamase Producer (ESBL).  In bloodstream infections from ESBL organisms, carbapenems are preferred over piperacillin/tazobactam. They are shown to have a lower risk of mortality. NO ANAEROBES ISOLATED; CULTURE IN PROGRESS FOR 5 DAYS    Report Status PENDING  Incomplete   Organism ID, Bacteria ESCHERICHIA COLI  Final      Susceptibility   Escherichia coli - MIC*    AMPICILLIN >=32 RESISTANT Resistant     CEFEPIME 1 SENSITIVE Sensitive     CEFTAZIDIME RESISTANT Resistant     CEFTRIAXONE 32 RESISTANT Resistant     CIPROFLOXACIN >=4 RESISTANT Resistant     GENTAMICIN <=1 SENSITIVE Sensitive     IMIPENEM <=0.25 SENSITIVE Sensitive     TRIMETH/SULFA >=320 RESISTANT Resistant     AMPICILLIN/SULBACTAM >=32 RESISTANT Resistant     PIP/TAZO >=128 RESISTANT Resistant     * RARE ESCHERICHIA COLI     Time coordinating discharge: Over 30 minutes  SIGNED:   Willeen Niece, MD  [facility] 05/05/2023, 3:44 PM Pager   If 7PM-7AM, please contact night-coverage

## 2023-05-05 NOTE — TOC Transition Note (Signed)
Transition of Care [facility]) - CM/SW Discharge Note   Patient Details  Name: John Meza MRN: [id number] Date of Birth: 07-18-1961  [facility]) CM/SW Contact:  Kermit Balo, RN Phone Number: 05/05/2023, 1:35 PM   Clinical Narrative:     Pt is discharging home with home health RN through [place] for IV abx. [facility] was provided LOG from [facility] for 5 RN visits.  Wound vac was d/ced. Bedside RN to teach wife at the bedside about dressing changes to incision area.  Pam with [facility] has educated the wife on administration of the IV abx.  Wife will provide transport home.   Final next level of care: Home w Home Health Services Barriers to Discharge: Inadequate or no insurance, Barriers Unresolved (comment)   Patient Goals and CMS Choice CMS Medicare.gov Compare Post Acute Care list provided to:: Patient Choice offered to / list presented to : Patient  Discharge Placement                         Discharge Plan and Services Additional resources added to the After Visit Summary for     Discharge Planning Services: CM Consult Post Acute Care Choice: Home Health          DME Arranged: N/A (see note)         HH Arranged: RN, PT (see note)       Representative spoke with at [facility]: pam with [employer] has arranged [facility] with LOG from the hospital  Social Determinants of Health (SDOH) Interventions SDOH Screenings   Food Insecurity: No Food Insecurity (04/30/2023)  Housing: Low Risk  (04/30/2023)  Transportation Needs: No Transportation Needs (04/30/2023)  Utilities: Not At Risk (04/30/2023)  Depression (PHQ2-9): Low Risk  (01/27/2023)  Social Connections: Unknown (02/22/2022)   Received from [facility]  Tobacco Use: Medium Risk (05/01/2023)     Readmission Risk Interventions     No data to display

## 2023-05-05 NOTE — Progress Notes (Signed)
Physical Therapy Treatment  Patient Details Name: John Meza MRN: [id number] DOB: 1961/08/15 Today's Date: 05/05/2023   History of Present Illness Patient is a 62 year old male admitted 7/12 presented with fever, N/V.  UA suspicious for pyuria vs. virus. 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 PMH: paraplegia (fall from ladder), neurogenic bladder requiring self cath, recurrent ESBL UTI, diabetes type 2, hypertension, chronic right buttock pressure ulcer. He was recently hospitalized here in June for complicated UTI due to Klebsiella pneumonia, Morganella morganii and was discharged on Cipro.    PT Comments  Focus of session was transfer training to prepare for d/c and return home via private vehicle. Pt and wife led transfers and were able to demonstrate transfer with slide board from bed to w/c, and back to bed at end of session. PT/OT guiding with bed pads for safety. 1 instance of anterior LOB requiring assist to recover, otherwise pt able to maintain good trunk stability throughout. Education provided about positioning, pressure relief techniques/schedule, and encouraged utilizing w/c more as tolerated. Pt and family anticipate d/c home this afternoon. Will continue to follow and progress as able per POC.      Assistance Recommended at Discharge Frequent or constant Supervision/Assistance  If plan is discharge home, recommend the following:  Can travel by private vehicle    A lot of help with bathing/dressing/bathroom;Assistance with cooking/housework;Direct supervision/assist for medications management;Assist for transportation;Help with stairs or ramp for entrance;Two people to help with walking and/or transfers      Equipment Recommendations  Other (comment) (reports has lift at home, needs air mattress for hospital bed)    Recommendations for Other Services       Precautions / Restrictions Precautions Precautions: Fall Precaution Comments: 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 Pt has a hospital bed at home.    Transfers Overall transfer level: Needs assistance Equipment used: Sliding board Transfers: Bed to chair/wheelchair/BSC            Lateral/Scoot Transfers: Min assist, +2 safety/equipment, With slide board General transfer comment: Wife leading transfer. She placed slideboard and managed LLE off EOB for pt. Pt and wife able to transition from bed to w/c and back to bed at end of session. PT/OT present for guarding and utilized bed pad for min assist. 1 instance of anterior LOB when returning to bed and required assist from PT and wife to recover.    Ambulation/Gait               General Gait Details: Non ambulatory at baseline   Stairs             Wheelchair Mobility     Tilt Bed    Modified Rankin (Stroke Patients Only)       Balance Overall balance assessment: Needs assistance Sitting-balance support: Bilateral upper extremity supported, Feet supported, No upper extremity supported, Single extremity supported Sitting balance-Leahy Scale: Poor Sitting balance - Comments: Anterior LOB with dynamic movement  Cognition Arousal/Alertness: Awake/alert Behavior During Therapy: WFL for tasks assessed/performed Overall Cognitive Status: Within Functional Limits for tasks assessed                                          Exercises      General Comments General comments (skin integrity, edema, etc.): Education provided about positioning, options for pressure relief  positions/activities. Encouraged pt to start utilizing w/c more as tolerated.      Pertinent Vitals/Pain Pain Assessment Pain Assessment: Faces Faces Pain Scale: No hurt Pain Intervention(s): Monitored during session    Home Living                          Prior Function            PT Goals (current goals can now be found in the care plan section) Acute Rehab PT Goals Patient Stated Goal: to get stronger PT Goal Formulation: With patient/family Time For Goal Achievement: 05/08/23 Potential to Achieve Goals: Fair Progress towards PT goals: Progressing toward goals    Frequency    Min 1X/week      PT Plan Current plan remains appropriate    Co-evaluation PT/OT/SLP Co-Evaluation/Treatment: Yes Reason for Co-Treatment: Complexity of the patient's impairments (multi-system involvement);For patient/therapist safety;To address functional/ADL transfers PT goals addressed during session: Mobility/safety with mobility;Balance OT goals addressed during session: ADL's and self-care      AM-PAC PT "6 Clicks" Mobility   Outcome Measure  Help needed turning from your back to your side while in a flat bed without using bedrails?: A Lot Help needed moving from lying on your back to sitting on the side of a flat bed without using bedrails?: A Lot Help needed moving to and from a bed to a chair (including a wheelchair)?: A Lot Help needed standing up from a chair using your arms (e.g., wheelchair or bedside chair)?: Total Help needed to walk in hospital room?: Total Help needed climbing 3-5 steps with a railing? : Total 6 Click Score: 9    End of Session   Activity Tolerance: Patient tolerated treatment well Patient left: with family/visitor present;in bed;with call bell/phone within reach Nurse Communication: Mobility status;Need for lift equipment PT Visit Diagnosis: Muscle weakness (generalized) (M62.81);Other abnormalities of gait and mobility (R26.89)      Time: 6962-9528 PT Time Calculation (min) (ACUTE ONLY): 37 min  Charges:    $Therapeutic Activity: 8-22 mins PT General Charges $$ ACUTE PT VISIT: 1 Visit                     Conni Slipper, PT, DPT Acute Rehabilitation Services Secure Chat Preferred Office: [phone]    Marylynn Pearson 05/05/2023, 3:31 PM

## 2023-05-05 NOTE — Progress Notes (Signed)
Removed central line with introducer per order. Patient is spanish speaking. Used interpreter for communication and explained that remove the central line while hold the breath. Patient followed the direction well. No complications after remove central line. He is going to be staying on the bed for 30 minutes. Explained daily assess on dressing site of s/s of infection after 24 hours later. Answered questions and patient understood it well. HS [employer]

## 2023-05-05 NOTE — Significant Event (Signed)
Did another dressing changed where the spouse did a return demonstration correctly. Right hip wound dressing change completed successful by patient's spouse. All questions answered. Patient's daughter also at bedside. Patient and spouse decline interpreter and wanted daughter to interpret.

## 2023-05-05 NOTE — Significant Event (Signed)
Wound VAC removed with patient's spouse at bedside to do education and do dressing change. Used video interpreter [facility]). Educated on when to do dressing change, hand hygiene to prevent new infections to wound, wound supplies. Patient's spouse wants to do another hand-on dressing change prior to discharge. Spoke with daughters regarding dressing change. Questions answered.

## 2023-05-06 ENCOUNTER — Telehealth: Payer: Self-pay

## 2023-05-06 LAB — AEROBIC/ANAEROBIC CULTURE W GRAM STAIN (SURGICAL/DEEP WOUND)

## 2023-05-06 NOTE — Transitions of Care (Post Inpatient/ED Visit) (Signed)
   05/06/2023  Name: John Meza MRN: [id number] DOB: [birth year]  Today's TOC FU Call Status: Today's TOC FU Call Status:: Unsuccessul Call (1st Attempt) Unsuccessful Call (1st Attempt) Date: 05/06/23  Attempted to reach the patient regarding the most recent Inpatient/ED visit.  Follow Up Plan: Additional outreach attempts will be made to reach the patient to complete the Transitions of Care (Post Inpatient/ED visit) call.   Signature Robyne Peers, RN

## 2023-05-10 ENCOUNTER — Ambulatory Visit (INDEPENDENT_AMBULATORY_CARE_PROVIDER_SITE_OTHER): Payer: Self-pay | Admitting: Orthopedic Surgery

## 2023-05-10 ENCOUNTER — Telehealth: Payer: Self-pay

## 2023-05-10 DIAGNOSIS — Z89621 Acquired absence of right hip joint: Secondary | ICD-10-CM

## 2023-05-10 NOTE — Transitions of Care (Post Inpatient/ED Visit) (Unsigned)
   05/10/2023  Name: John Meza MRN: [id number] DOB: May 16, 1961  Today's TOC FU Call Status: TOC FU Call Complete Date: 05/10/23  Transition Care Management Follow-up Telephone Call Date of Discharge: 05/05/23 Discharge Facility: [facility]) Type of Discharge: Inpatient Admission Primary Inpatient Discharge Diagnosis:: Sepsis How have you been since you were released from the hospital?: Better Any questions or concerns?: No  Items Reviewed: Did you receive and understand the discharge instructions provided?: Yes Medications obtained,verified, and reconciled?: Partial Review Completed Reason for Partial Mediation Review: Patient said that he understood his medication and if he needs help his wife could help Any new allergies since your discharge?: No Dietary orders reviewed?: Yes Type of Diet Ordered:: low sodium heart healthy ,Carb Modified Do you have support at home?: Yes People in Home: spouse Name of Support/Comfort Primary Source: [name]  Medications Reviewed Today: Medications Reviewed Today   Medications were not reviewed in this encounter     Home Care and Equipment/Supplies: Were Home Health Services Ordered?: No Any new equipment or medical supplies ordered?: No Name of Medical supply agency?: Did not know (Patient has picc line receiving antibiotic , did not know company name. Patient does not have insurance , receives hospital assistance.) Were you able to get the equipment/medical supplies?: Yes Do you have any questions related to the use of the equipment/supplies?: No  Functional Questionnaire: Do you need assistance with bathing/showering or dressing?: Yes (spouse assists) Do you need assistance with meal preparation?: Yes (spouse assists) Do you need assistance with eating?: No Do you have difficulty maintaining continence: No Do you need assistance with getting out of bed/getting out of a chair/moving?: Yes (spouse assists) Do you have  difficulty managing or taking your medications?: No  Follow up appointments reviewed: PCP Follow-up appointment confirmed?: Yes Date of PCP follow-up appointment?: 05/20/23 Follow-up Provider: Dr. [name] Specialist [facility] Follow-up appointment confirmed?: Yes Date of Specialist follow-up appointment?: 05/10/23 Follow-Up Specialty Provider:: Dr. Lajoyce Corners Do you understand care options if your condition(s) worsen?: Yes-patient verbalized understanding    SIGNATURE:Lesleigh Hughson Manson Passey, RN

## 2023-05-11 ENCOUNTER — Other Ambulatory Visit (HOSPITAL_COMMUNITY): Payer: Self-pay

## 2023-05-17 ENCOUNTER — Ambulatory Visit (INDEPENDENT_AMBULATORY_CARE_PROVIDER_SITE_OTHER): Payer: Self-pay | Admitting: Orthopedic Surgery

## 2023-05-17 DIAGNOSIS — Z89621 Acquired absence of right hip joint: Secondary | ICD-10-CM

## 2023-05-18 ENCOUNTER — Encounter: Payer: Self-pay | Admitting: Orthopedic Surgery

## 2023-05-18 NOTE — Progress Notes (Signed)
Office Visit Note   Patient: John Meza           Date of Birth: 1960/12/28           MRN: [id number] Visit Date: 05/17/2023              Requested by: Storm Frisk, MD [address] [place],  [place] [address] PCP: Storm Frisk, MD  Chief Complaint  Patient presents with   Right Leg - Routine Post Op    05/01/2023 right hip disarticulation       HPI: Patient is a 62 year old gentleman who is 2 weeks status post right hip disarticulation.  Patient states he has numbness over the anterior aspect of his thigh.  Assessment & Plan: Visit Diagnoses:  1. History of disarticulation of right hip     Plan: Will proceed with Dial soap cleansing dry dressing changes.  Harvest sutures at 1 week in follow-up.  Follow-Up Instructions: Return in about 1 week (around 05/24/2023).   Ortho Exam  Patient is alert, oriented, no adenopathy, well-dressed, normal affect, normal respiratory effort. Examination there is no redness no cellulitis no drainage.  The incision is well-healed.  Imaging: No results found. No images are attached to the encounter.  Labs: Lab Results  Component Value Date   HGBA1C 7.0 (H) 04/12/2023   HGBA1C 6.5 (H) 10/28/2022   HGBA1C 5.8 (H) 07/23/2022   ESRSEDRATE 60 (H) 04/25/2023   ESRSEDRATE 125 (H) 03/16/2022   ESRSEDRATE 80 (H) 11/03/2016   CRP 19.5 (H) 04/25/2023   CRP 229.2 (H) 03/16/2022   REPTSTATUS 05/06/2023 FINAL 05/01/2023   GRAMSTAIN FEW WBC SEEN NO ORGANISMS SEEN  05/01/2023   CULT  05/01/2023    RARE ESCHERICHIA COLI Confirmed Extended Spectrum Beta-Lactamase Producer (ESBL).  In bloodstream infections from ESBL organisms, carbapenems are preferred over piperacillin/tazobactam. They are shown to have a lower risk of mortality. NO ANAEROBES ISOLATED Performed at [facility], [address], [place], [place] [address]    [facility] ESCHERICHIA COLI 05/01/2023     Lab Results  Component Value Date    ALBUMIN 1.6 (L) 04/30/2023   ALBUMIN 1.9 (L) 04/23/2023   ALBUMIN 1.6 (L) 04/15/2023    Lab Results  Component Value Date   MG 1.7 05/04/2023   MG 1.6 (L) 05/03/2023   MG 1.1 (L) 05/01/2023   Lab Results  Component Value Date   VD25OH 24.0 (L) 10/28/2022   VD25OH 10.2 (L) 05/19/2022    No results found for: "PREALBUMIN"    Latest Ref Rng & Units 05/04/2023    5:00 AM 05/03/2023    4:00 AM 05/02/2023    5:40 AM  CBC EXTENDED  WBC 4.0 - 10.5 K/uL 9.5  11.7  11.7   RBC 4.22 - 5.81 MIL/uL 3.48  3.42  3.48   Hemoglobin 13.0 - 17.0 g/dL 75.6  43.3  29.5   HCT 39.0 - 52.0 % 30.8  30.4  30.3   Platelets 150 - 400 K/uL 244  177  128      There is no height or weight on file to calculate BMI.  Orders:  No orders of the defined types were placed in this encounter.  No orders of the defined types were placed in this encounter.    Procedures: No procedures performed  Clinical Data: No additional findings.  ROS:  All other systems negative, except as noted in the HPI. Review of Systems  Objective: Vital Signs: There  were no vitals taken for this visit.  Specialty Comments:  No specialty comments available.  PMFS History: Patient Active Problem List   Diagnosis Date Noted   Abscess of right hip 05/01/2023   Pelvic abscess in male [facility]) 05/01/2023   Hematoma of hip, initial encounter 05/01/2023   Septic hip (HCC) 04/28/2023   Osteomyelitis of right hip (HCC) 04/28/2023   Leukocytosis 04/25/2023   Fever of unknown origin 04/25/2023   Sepsis secondary to UTI (HCC) 04/23/2023   Sepsis (HCC) 04/11/2023   History of infection due to extended spectrum beta lactamase (ESBL) producing bacteria 04/11/2023   AKI (acute kidney injury) (HCC) 04/11/2023   Hyperlipidemia associated with type 2 diabetes mellitus (HCC) 12/01/2022   Dental caries 12/01/2022   Iron deficiency anemia due to chronic blood loss 11/14/2022   Anemia of chronic disease 11/14/2022   Diarrhea  06/23/2022   Medication monitoring encounter 04/21/2022   Normocytic anemia 03/03/2022   Complicated UTI (urinary tract infection) 02/28/2022   Wheelchair dependence 12/23/2020   History of cholecystectomy 11/18/2020   Hyponatremia    Essential hypertension    Hypokalemia    Neurogenic bladder 01/06/2012   Neurogenic bowel 01/06/2012   Paraplegia (HCC) 11/16/2011   Controlled type 2 diabetes mellitus (HCC) 05/26/2007   OBESITY, MODERATE 05/26/2007   Past Medical History:  Diagnosis Date   Cellulitis and abscess of buttock 10/2016   Diabetes mellitus    ESBL (extended spectrum beta-lactamase) producing bacteria infection 03/16/2022   Hyperkalemia 03/16/2022   Hypertension    Myositis 04/21/2022   Paraplegia (HCC) 2013   fell from ladder   Pressure ulcer, buttock, right, unstageable (HCC) 12/30/2020   Pyelonephritis due to Escherichia coli 03/16/2022   Sacral decubitus ulcer, stage IV (HCC) 11/03/2021   SCI (spinal cord injury)    Sepsis due to Escherichia coli (E. coli) (HCC) 03/01/2022   Spine fracture 11/16/2011   T 11- T9-L1    Family History  Problem Relation Age of Onset   Healthy Mother    Diabetes Father    Diabetes Sister     Past Surgical History:  Procedure Laterality Date   APPLICATION OF WOUND VAC Right 05/01/2023   Procedure: APPLICATION OF WOUND VAC;  Surgeon: [name], MD;  Location: [facility] OR;  Service: Orthopedics;  Laterality: Right;   CHOLECYSTECTOMY N/A 05/01/2015   Procedure: LAPAROSCOPIC CHOLECYSTECTOMY WITH ATTEMPTED INTRAOPERATIVE CHOLANGIOGRAM;  Surgeon: Violeta Gelinas, MD;  Location: [facility] OR;  Service: General;  Laterality: N/A;   HIP DISARTICULATION Right 05/01/2023   Procedure: RIGHT HIP DISARTICULATION;  Surgeon: [name], MD;  Location: [facility] OR;  Service: Orthopedics;  Laterality: Right;   IR US GUIDE BX ASP/DRAIN  04/27/2023   SPINE SURGERY     TEE WITHOUT CARDIOVERSION N/A 03/26/2022   Procedure: TRANSESOPHAGEAL ECHOCARDIOGRAM (TEE);  Surgeon:  Elease Hashimoto Deloris Ping, MD;  Location: [facility] ENDOSCOPY;  Service: Cardiovascular;  Laterality: N/A;   WOUND EXPLORATION Right 05/01/2023   Procedure: WOUND EXPLORATION OF HIP;  Surgeon: [name], MD;  Location[facility] OR;  Service: Orthopedics;  Laterality: Right;   Social History   Occupational History   Occupation: disabled  Tobacco Use   Smoking status: Former    Current packs/day: 0.00    Average packs/day: 1 pack/day for 5.0 years (5.0 ttl pk-yrs)    Types: Cigarettes    Start date: 10/12/2006    Quit date: 10/13/2011    Years since quitting: 11.6   Smokeless tobacco: Never   Tobacco comments:  03-24-19 per pt he stopped 1 mo ago   Vaping Use   Vaping status: Never Used  Substance and Sexual Activity   Alcohol use: Not Currently    Alcohol/week: 1.0 standard drink of alcohol    Types: 1 Cans of beer per week   Drug use: Never   Sexual activity: Not on file

## 2023-05-19 ENCOUNTER — Other Ambulatory Visit (HOSPITAL_BASED_OUTPATIENT_CLINIC_OR_DEPARTMENT_OTHER): Payer: Self-pay

## 2023-05-19 ENCOUNTER — Other Ambulatory Visit: Payer: Self-pay | Admitting: Critical Care Medicine

## 2023-05-19 ENCOUNTER — Encounter: Payer: Self-pay | Admitting: Hematology

## 2023-05-19 ENCOUNTER — Other Ambulatory Visit: Payer: Self-pay

## 2023-05-19 DIAGNOSIS — D500 Iron deficiency anemia secondary to blood loss (chronic): Secondary | ICD-10-CM

## 2023-05-19 MED ORDER — FERROUS SULFATE 325 (65 FE) MG PO TABS
325.0000 mg | ORAL_TABLET | Freq: Every day | ORAL | 0 refills | Status: DC
  Filled 2023-05-19: qty 30, 30d supply, fill #0

## 2023-05-20 ENCOUNTER — Other Ambulatory Visit: Payer: Self-pay

## 2023-05-20 ENCOUNTER — Inpatient Hospital Stay: Payer: Self-pay | Admitting: Critical Care Medicine

## 2023-05-24 ENCOUNTER — Ambulatory Visit (INDEPENDENT_AMBULATORY_CARE_PROVIDER_SITE_OTHER): Payer: Self-pay | Admitting: Orthopedic Surgery

## 2023-05-24 ENCOUNTER — Encounter: Payer: Self-pay | Admitting: Orthopedic Surgery

## 2023-05-24 DIAGNOSIS — Z89621 Acquired absence of right hip joint: Secondary | ICD-10-CM

## 2023-05-24 NOTE — Progress Notes (Signed)
Patient is status post right hip disarticulation approximately 3 weeks out from surgery.  The incision is well-approximated there is no cellulitis no drainage.  We will harvest the sutures today continue Dial soap cleansing use gauze dressing over areas that may drain.  Plan to follow-up in 4 weeks at which time we will have him follow-up with [facility] for prosthetic fitting.

## 2023-05-25 ENCOUNTER — Other Ambulatory Visit: Payer: Self-pay

## 2023-05-25 ENCOUNTER — Encounter: Payer: Self-pay | Admitting: Internal Medicine

## 2023-05-25 ENCOUNTER — Ambulatory Visit (INDEPENDENT_AMBULATORY_CARE_PROVIDER_SITE_OTHER): Payer: Self-pay | Admitting: Internal Medicine

## 2023-05-25 ENCOUNTER — Encounter: Payer: Self-pay | Admitting: Orthopedic Surgery

## 2023-05-25 VITALS — BP 124/74 | HR 72 | Temp 97.8°F

## 2023-05-25 DIAGNOSIS — Z79899 Other long term (current) drug therapy: Secondary | ICD-10-CM

## 2023-05-25 DIAGNOSIS — Z452 Encounter for adjustment and management of vascular access device: Secondary | ICD-10-CM

## 2023-05-25 DIAGNOSIS — M869 Osteomyelitis, unspecified: Secondary | ICD-10-CM

## 2023-05-25 NOTE — Assessment & Plan Note (Signed)
No issues with the picc line and working well. Will notify home health to remove after his last dose 8/17

## 2023-05-25 NOTE — Assessment & Plan Note (Signed)
Creat, LFTs wnl.  Platelets previously with some elevation but normalized last available check

## 2023-05-25 NOTE — Progress Notes (Signed)
   Subjective:    Patient ID: John Meza, male    DOB: 1961-04-13, 62 y.o.   MRN: [id number]  HPI John Meza is here for follow up of right hip osteomyelitis. He developed osteomyelitis of the proximal femur with near desctruction of it and s/p hip disarticulartion by Dr. Lajoyce Corners 05/01/23.  Culture with ESBL E coli and he has been on ertapenem since then with a plan to continue through August 17.  CRP and ESR wnl last week.  No issues with rash or diarrhea.    Review of Systems  Constitutional:  Negative for chills, fatigue and fever.  Gastrointestinal:  Negative for diarrhea.  Skin:  Negative for rash.       Objective:   Physical Exam Eyes:     General: No scleral icterus. Pulmonary:     Effort: Pulmonary effort is normal.  Neurological:     Mental Status: He is alert.   SH": no tobacco        Assessment & Plan:

## 2023-05-25 NOTE — Assessment & Plan Note (Signed)
Now s/p surgical management and follow up antibiotics.  No concerns clinically or by labs so will continue with plan to stop after 8/17.   He will otherwise follow up here as needed

## 2023-05-25 NOTE — Progress Notes (Signed)
Patient is a 62 year old gentleman who is 1 week status post right hip disarticulation.  Examination there is clear serosanguineous drainage there is no cellulitis the wound edges are well-approximated.  Recommended Dial soap cleansing dry dressing changes daily with 4 x 4 and tape.

## 2023-05-26 ENCOUNTER — Encounter: Payer: Self-pay | Admitting: Family

## 2023-05-26 ENCOUNTER — Ambulatory Visit (INDEPENDENT_AMBULATORY_CARE_PROVIDER_SITE_OTHER): Payer: Self-pay | Admitting: Family

## 2023-05-26 DIAGNOSIS — Z89621 Acquired absence of right hip joint: Secondary | ICD-10-CM

## 2023-05-26 NOTE — Progress Notes (Unsigned)
Established Patient Office Visit  Subjective   Patient ID: John Meza, male    DOB: 16-Jan-1961  Age: 62 y.o. MRN: [id number]  No chief complaint on file.   12/01/22 61 y.o.M last seen by [name] 07/2022 Saw [name] 10/2022 At the visit January 17 patient had slight increase in A1c to 6.5 and insulin dose was increased.  Patient maintained iron supplement.  He is followed by hematology who is considering iron infusion.  The patient then had low blood sugars and was seen by [name] nurse practitioner at the [facility] primary care office Documentation is as below Saw [name] end of Jan PCE [place] Patient's last appointment 10/28/2022 at [facility] with [name], [place]. During that visit patient was seen for diabetes management. His hemoglobin A1c resulted 6.5% which was increased from previous 5.8%. Lab results review indicates that [name], [place] instructed patient to increase daily insulin from 25 units daily to 28 units daily. Today patient states that he was having low blood sugars before the insulin dose was increased but he forgot to tell [name], [place] during his appointment with her. Patient reports he self-discontinued insulin around 7 days ago. Since then blood sugars are 80's while fasting. He states he feels back to normal since no longer having low blood sugars. He is taking Metformin and Jardiance as prescribed for diabetes management. Patient and patient's wife confirmed they do not having any questions regarding blood pressure medication and no further issues/concerns for discussion on today.   1. Insulin dependent type 2 diabetes mellitus, controlled (HCC) 2. Hypoglycemia - Patient's last appointment was on 10/28/2022 at [facility] with [name], [place]. During that visit patient was seen for diabetes management. His hemoglobin A1c resulted 6.5% which was increased from previous 5.8%. Lab results  review indicates that [name], [place] instructed patient to increase daily Insulin Glargine from 25 units daily to 28 units daily.  - Today patient reports he self-discontinued Insulin Glargine 7 days ago and and fasting blood sugars are now in the 80's with no side effects.  - Today patient instructed to continue to hold Insulin Glargine for now. - Continue Metformin and Sitagliptin as prescribed. No refills needed as of present.  - Discussed the importance of healthy eating habits, low-carbohydrate diet, low-sugar diet, regular aerobic exercise (at least 150 minutes a week as tolerated) and medication compliance to achieve or maintain control of diabetes. - Follow-up with primary provider [name], MD in 2 weeks or sooner if needed.   Patient had wounds on both ankles these have resolved patient had wound on buttocks this has resolved  01/27/23 Patient seen in return follow-up overall is stable and doing well visit assisted by [name] [phone] video Spanish interpreter.  Patient's wife accompanies him.  All of his wounds of healed.  He did get an eye exam at [facility] will need a report from this.  He needs to have metabolic panel and lipids rechecked.  Blood sugars are markedly improved on his diary.  There are no other complaints.  Blood pressure on arrival on recheck is 125/74  8/15 Admit date: 04/23/2023   Discharge date: 05/05/2023   Admitted From: Home.   Disposition:  Home Health Services.   Recommendations for Outpatient Follow-up:  1. Follow up with PCP in 1-2 weeks. 2. Please obtain BMP/CBC in one week. 3. Advised to follow up Orthopaedics Dr. Lajoyce Corners in 1 week. 4. Advised to Continue  ertapenem  IV through PICC line for 4 weeks. 5. Please remove PICC line after completion of IV antibiotics.   Home Health:Home PT/OT Equipment/Devices:PICC line   Discharge Condition: Stable CODE STATUS:Full code Diet recommendation: Heart Healthy   Brief [facility] Course: This  62 year old male with history of paraplegia, neurogenic bladder requiring self cath, recurrent ESBL UTI, diabetes type 2, hypertension, chronic right buttock pressure ulcer who presented with fever. He was recently hospitalized here in June for complicated UTI due to Klebsiella pneumonia, Morganella morganii and was discharged on Cipro. Report of fever with nausea and vomiting. On presentation, lab work showed WBC count of 26,000, lactate of 3. UA suspicious for pyuria. Started on Merem. Continued to be febrile intermittently. Urine culture has not shown any growth. CT abdomen/pelvis showed large collection around the right hip. IR consulted for drain. Orthopedics also consulted. 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 Surgical course complicated by hemorrhagic shock requiring multiple units of blood transfusion and subsequent intubation. Patient successfully extubated 05/02/2023.  Diagnosis consistent with osteomyelitis of the right hip.  Infectious disease recommended to continue ertapenem.  Patient will need PICC line and IV antibiotics for 4 weeks.  Received PICC line and patient will continue ertapenem IV daily for 4 weeks.  Wound Vac removed.  Patient will need follow-up with Dr. Lajoyce Corners in 2 weeks. Patient is being discharged home.  home health services arranged.     Discharge Diagnoses:  Principal Problem:   Sepsis (HCC) Active Problems:   Controlled type 2 diabetes mellitus (HCC)   Paraplegia (HCC)   Hyponatremia   Essential hypertension   Complicated UTI (urinary tract infection)   Normocytic anemia   Hyperlipidemia associated with type 2 diabetes mellitus (HCC)   Leukocytosis   Fever of unknown origin   Septic hip (HCC)   Osteomyelitis of right hip (HCC)   Abscess of right hip   Pelvic abscess in male [facility])   Hematoma of hip, initial encounter   Septic arthritis Right hip: Right Hip Osteomyelitis: -Presented with leukocytosis, fever, tachycardia, dyspnea.  Elevated  lactate.  Blood cultures not showing any growth.   -Suspected UTI but urine culture did not show any growth.  Started on [place]. Procalcitonin found to be elevated.   -Continued to spike fever intermittently. Chest x-ray did not show pneumonia. -CT abdomen/pelvis showed large collection on the right hip.  Right hip area also found to be edematous.   -Patient is paraplegic and does not have any sensation. He did not complain of any pain. -Orthopedics consulted>>appreciate Dr. Lajoyce Corners expertise -aspirate culture = ESBL E coli  -ID consulted>>continue merrem, d/c vanco -Dr. Duda>>hip disarticulation--planned 7/20 -Patient underwent Right hip disarticulation 7/20. Surgical course complicated by hemorrhagic shock requiring multiple units of blood transfusion and subsequent intubation. -Patient successfully extubated later in the day 7/20. -H&H is stable.  PT and OT recommended acute inpatient rehabilitation. -ID recommended 4 weeks of IV antibiotics from now to ensure clearance and with abscess over the iliac crest. -Wound VAC removed at the time of discharge. - Patient has a PICC line.  Patient will get meropenem for 4 weeks.   Recurrent complicated UTI: History of ESBL E. coli UTI in February 2021, treated for Klebsiella/Morganella UTI a month ago.   Patient has neurogenic bladder, requires self cath.   Currently on Foley cath.   Plan from previous hospitalist to potentially discharge with indwelling foley.  -7/12 Urine culture did not show any growth.   Nausea/vomiting:    -Resolved--tolerating diet.   Diabetes  type 2: -04/12/23 A1c of 7.  On Glucophage, Januvia at home.   -Currently on sliding scale - basal added.   Hypertension:  -HCTZ discontinued for hyponatremia. -ARB on hold as well   Hyperlipidemia: Continue Lipitor   Paraplegia: On wheelchair-bound.  Has neurogenic bladder.     Chronic right buttock pressure ulcer: Wound care following.     Hyponatremia: > Resolved. Looks like  this is a chronic problem: On reviewing his previous lab works, he is sodium has been in the range of 120s.   Discontinued  hydrochlorothiazide.  Hold ARB as well.  Monitor.   Serum sodium improved.   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 PO iron every other day H/H stable.   Hypomagnesemia Hypophosphatemia: Replaced.  Continue to monitor      [facility] pharmacist to adjust dose for Vancomycin, Aminoglycosides and other anti-infective therapies as requested by physician.   Complete by: As directed        Review of Systems  Constitutional:  Negative for chills, diaphoresis, fever, malaise/fatigue and weight loss.  HENT:  Negative for congestion, hearing loss, nosebleeds, sore throat and tinnitus.   Eyes:  Negative for blurred vision, photophobia and redness.  Respiratory:  Negative for cough, hemoptysis, sputum production, shortness of breath, wheezing and stridor.   Cardiovascular:  Negative for chest pain, palpitations, orthopnea, claudication, leg swelling and PND.  Gastrointestinal:  Negative for abdominal pain, blood in stool, constipation, diarrhea, heartburn, nausea and vomiting.  Genitourinary:  Negative for dysuria, flank pain, frequency, hematuria and urgency.  Musculoskeletal:  Negative for back pain, falls, joint pain, myalgias and neck pain.  Skin:  Negative for itching and rash.  Neurological:  Negative for dizziness, tingling, tremors, sensory change, speech change, focal weakness, seizures, loss of consciousness, weakness and headaches.  Endo/Heme/Allergies:  Negative for environmental allergies and polydipsia. Does not bruise/bleed easily.  Psychiatric/Behavioral:  Negative for depression, memory loss, substance abuse and suicidal ideas. The patient is not nervous/anxious and does not have insomnia.       Objective:     There were no vitals taken for this visit.   Physical Exam Vitals reviewed.  Constitutional:       Appearance: Normal appearance. He is well-developed. He is not diaphoretic.  HENT:     Head: Normocephalic and atraumatic.     Nose: No nasal deformity, septal deviation, mucosal edema or rhinorrhea.     Right Sinus: No maxillary sinus tenderness or frontal sinus tenderness.     Left Sinus: No maxillary sinus tenderness or frontal sinus tenderness.     Mouth/Throat:     Pharynx: No oropharyngeal exudate.     Comments: Poor dentition dental carry right lower molar Eyes:     General: No scleral icterus.    Conjunctiva/sclera: Conjunctivae normal.     Pupils: Pupils are equal, round, and reactive to light.  Neck:     Thyroid: No thyromegaly.     Vascular: No carotid bruit or JVD.     Trachea: Trachea normal. No tracheal tenderness or tracheal deviation.  Cardiovascular:     Rate and Rhythm: Normal rate and regular rhythm.     Chest Wall: PMI is not displaced.     Pulses: Normal pulses. No decreased pulses.     Heart sounds: Normal heart sounds, S1 normal and S2 normal. Heart sounds not distant. No murmur heard.    No systolic murmur is present.     No diastolic murmur is  present.     No friction rub. No gallop. No S3 or S4 sounds.  Pulmonary:     Effort: No tachypnea, accessory muscle usage or respiratory distress.     Breath sounds: No stridor. No decreased breath sounds, wheezing, rhonchi or rales.  Chest:     Chest wall: No tenderness.  Abdominal:     General: Bowel sounds are normal. There is no distension.     Palpations: Abdomen is soft. Abdomen is not rigid.     Tenderness: There is no abdominal tenderness. There is no guarding or rebound.  Musculoskeletal:        General: Normal range of motion.     Cervical back: Normal range of motion and neck supple. No edema, erythema or rigidity. No muscular tenderness. Normal range of motion.  Lymphadenopathy:     Head:     Right side of head: No submental or submandibular adenopathy.     Left side of head: No submental or  submandibular adenopathy.     Cervical: No cervical adenopathy.  Skin:    General: Skin is warm and dry.     Coloration: Skin is not pale.     Findings: No rash.     Nails: There is no clubbing.  Neurological:     Mental Status: He is alert and oriented to person, place, and time. Mental status is at baseline.     Sensory: No sensory deficit.     Motor: Weakness present.     Gait: Gait abnormal.  Psychiatric:        Speech: Speech normal.        Behavior: Behavior normal.      No results found for any visits on 05/27/23.     The ASCVD Risk score (Arnett DK, et al., 2019) failed to calculate for the following reasons:   The valid total cholesterol range is 130 to 320 mg/dL    Assessment & Plan:   Problem List Items Addressed This Visit   None   No follow-ups on file.    [name], MD

## 2023-05-26 NOTE — Progress Notes (Signed)
Post-Op Visit Note   Patient: John Meza           Date of Birth: 04/27/1961           MRN: [id number] Visit Date: 05/26/2023 PCP: Storm Frisk, MD  Chief Complaint:  Chief Complaint  Patient presents with   Right Leg - Routine Post Op    05/01/2023 right hip disarticulation     HPI:  HPI The patient is a 62 year old gentleman who is seen status post right hip disarticulation.  July 20.  He is seen today for concern of an area of the incision which has opened up since suture removal. Ortho Exam On examination of the right hip the incision is healing well and approximated proximally.  Distally there is an area that is about 3 cm in length which has dehisced this is 1 cm deep filled in with granulation.  There is no drainage no surrounding erythema or odor  visit Diagnoses: No diagnosis found.  Plan: Additional Steri-Strips applied.  Will continue with current wound care.  Daily dose of cleansing.  Dry dressings.  Leaves Steri-Strips in place until they fall off.  he will follow-up in 1 week.  All questions encouraged and answered  Follow-Up Instructions: No follow-ups on file.   Imaging: No results found.  Orders:  No orders of the defined types were placed in this encounter.  No orders of the defined types were placed in this encounter.    PMFS History: Patient Active Problem List   Diagnosis Date Noted   Medication management 05/25/2023   Abscess of right hip 05/01/2023   Pelvic abscess in male [facility]) 05/01/2023   Hematoma of hip, initial encounter 05/01/2023   Septic hip (HCC) 04/28/2023   Osteomyelitis of right hip (HCC) 04/28/2023   Leukocytosis 04/25/2023   History of infection due to extended spectrum beta lactamase (ESBL) producing bacteria 04/11/2023   Hyperlipidemia associated with type 2 diabetes mellitus (HCC) 12/01/2022   Dental caries 12/01/2022   Iron deficiency anemia due to chronic blood loss 11/14/2022   Anemia of chronic  disease 11/14/2022   Medication monitoring encounter 04/21/2022   PICC (peripherally inserted central catheter) in place 03/16/2022   Normocytic anemia 03/03/2022   Wheelchair dependence 12/23/2020   History of cholecystectomy 11/18/2020   Hyponatremia    Essential hypertension    Hypokalemia    Neurogenic bladder 01/06/2012   Neurogenic bowel 01/06/2012   Paraplegia (HCC) 11/16/2011   Controlled type 2 diabetes mellitus (HCC) 05/26/2007   OBESITY, MODERATE 05/26/2007   Past Medical History:  Diagnosis Date   Cellulitis and abscess of buttock 10/2016   Diabetes mellitus    ESBL (extended spectrum beta-lactamase) producing bacteria infection 03/16/2022   Hyperkalemia 03/16/2022   Hypertension    Myositis 04/21/2022   Paraplegia (HCC) 2013   fell from ladder   Pressure ulcer, buttock, right, unstageable (HCC) 12/30/2020   Pyelonephritis due to Escherichia coli 03/16/2022   Sacral decubitus ulcer, stage IV (HCC) 11/03/2021   SCI (spinal cord injury)    Sepsis due to Escherichia coli (E. coli) (HCC) 03/01/2022   Spine fracture 11/16/2011   T 11- T9-L1    Family History  Problem Relation Age of Onset   Healthy Mother    Diabetes Father    Diabetes Sister     Past Surgical History:  Procedure Laterality Date   APPLICATION OF WOUND VAC Right 05/01/2023   Procedure: APPLICATION OF WOUND VAC;  Surgeon: [name], MD;  Location: [facility] OR;  Service: Orthopedics;  Laterality: Right;   CHOLECYSTECTOMY N/A 05/01/2015   Procedure: LAPAROSCOPIC CHOLECYSTECTOMY WITH ATTEMPTED INTRAOPERATIVE CHOLANGIOGRAM;  Surgeon: Violeta Gelinas, MD;  Location: [facility] OR;  Service: General;  Laterality: N/A;   HIP DISARTICULATION Right 05/01/2023   Procedure: RIGHT HIP DISARTICULATION;  Surgeon: [name], MD;  Location: [facility] OR;  Service: Orthopedics;  Laterality: Right;   IR US GUIDE BX ASP/DRAIN  04/27/2023   SPINE SURGERY     TEE WITHOUT CARDIOVERSION N/A 03/26/2022   Procedure: TRANSESOPHAGEAL ECHOCARDIOGRAM  (TEE);  Surgeon: Elease Hashimoto Deloris Ping, MD;  Location: [facility] ENDOSCOPY;  Service: Cardiovascular;  Laterality: N/A;   WOUND EXPLORATION Right 05/01/2023   Procedure: WOUND EXPLORATION OF HIP;  Surgeon: [name], MD;  Location: [facility] OR;  Service: Orthopedics;  Laterality: Right;   Social History   Occupational History   Occupation: disabled  Tobacco Use   Smoking status: Former    Current packs/day: 0.00    Average packs/day: 1 pack/day for 5.0 years (5.0 ttl pk-yrs)    Types: Cigarettes    Start date: 10/12/2006    Quit date: 10/13/2011    Years since quitting: 11.6   Smokeless tobacco: Never   Tobacco comments:    03-24-19 per pt he stopped 1 mo ago   Vaping Use   Vaping status: Never Used  Substance and Sexual Activity   Alcohol use: Not Currently    Alcohol/week: 1.0 standard drink of alcohol    Types: 1 Cans of beer per week   Drug use: Never   Sexual activity: Not on file

## 2023-05-27 ENCOUNTER — Encounter: Payer: Self-pay | Admitting: Critical Care Medicine

## 2023-05-27 ENCOUNTER — Encounter: Payer: Self-pay | Admitting: Hematology

## 2023-05-27 ENCOUNTER — Ambulatory Visit: Payer: Self-pay | Attending: Critical Care Medicine | Admitting: Critical Care Medicine

## 2023-05-27 ENCOUNTER — Telehealth: Payer: Self-pay | Admitting: Critical Care Medicine

## 2023-05-27 VITALS — BP 109/65 | HR 69

## 2023-05-27 DIAGNOSIS — M009 Pyogenic arthritis, unspecified: Secondary | ICD-10-CM

## 2023-05-27 DIAGNOSIS — Z452 Encounter for adjustment and management of vascular access device: Secondary | ICD-10-CM

## 2023-05-27 DIAGNOSIS — L02415 Cutaneous abscess of right lower limb: Secondary | ICD-10-CM

## 2023-05-27 DIAGNOSIS — G8220 Paraplegia, unspecified: Secondary | ICD-10-CM

## 2023-05-27 DIAGNOSIS — Z993 Dependence on wheelchair: Secondary | ICD-10-CM

## 2023-05-27 DIAGNOSIS — N390 Urinary tract infection, site not specified: Secondary | ICD-10-CM

## 2023-05-27 DIAGNOSIS — M869 Osteomyelitis, unspecified: Secondary | ICD-10-CM

## 2023-05-27 DIAGNOSIS — E1165 Type 2 diabetes mellitus with hyperglycemia: Secondary | ICD-10-CM

## 2023-05-27 DIAGNOSIS — I10 Essential (primary) hypertension: Secondary | ICD-10-CM

## 2023-05-27 DIAGNOSIS — Z7984 Long term (current) use of oral hypoglycemic drugs: Secondary | ICD-10-CM

## 2023-05-27 NOTE — Assessment & Plan Note (Signed)
A1c close to goal continue current medication

## 2023-05-27 NOTE — Telephone Encounter (Signed)
Erskine Squibb can you look into getting an air mattress for this patient to the have to sign an order I will do so

## 2023-05-27 NOTE — Assessment & Plan Note (Signed)
Resolved with hip disarticulation continue to monitor continue antibiotics

## 2023-05-27 NOTE — Assessment & Plan Note (Signed)
Will be removed once antibiotics completed

## 2023-05-27 NOTE — Assessment & Plan Note (Signed)
Resolved with hip disarticulation continue antibiotics

## 2023-05-27 NOTE — Assessment & Plan Note (Signed)
Hypertension under good control no changes

## 2023-05-27 NOTE — Assessment & Plan Note (Signed)
Patient would like an air mattress will inquire with case management

## 2023-05-27 NOTE — Patient Instructions (Addendum)
All medications were refilled  Case manager will call you about the air mattress  Blood work today  Keep follow-ups with orthopedics Dr. Lajoyce Corners and Dr. Lindi Adie infectious disease  Bring urine sample and for urine culture and protein in the urine check after obtained at home  [facility] fueron reabastecidos.  El Neurosurgeon de casos lo llamar sobre el colchn de [place].  Anlisis de [employer].  Mantener seguimiento con ortopedia Dr. Lajoyce Corners y Dr. Lindi Adie enfermedades infecciosas  Worthy Rancher de orina para urocultivo y control de protenas en la orina despus de obtenerla en casa.

## 2023-05-28 ENCOUNTER — Other Ambulatory Visit: Payer: Self-pay

## 2023-05-28 ENCOUNTER — Telehealth: Payer: Self-pay

## 2023-05-28 LAB — CBC WITH DIFFERENTIAL/PLATELET
Basophils Absolute: 0.1 10*3/uL (ref 0.0–0.2)
Basos: 1 %
EOS (ABSOLUTE): 0.3 10*3/uL (ref 0.0–0.4)
Eos: 4 %
Hematocrit: 34.4 % — ABNORMAL LOW (ref 37.5–51.0)
Hemoglobin: 11.2 g/dL — ABNORMAL LOW (ref 13.0–17.7)
Immature Grans (Abs): 0 10*3/uL (ref 0.0–0.1)
Immature Granulocytes: 0 %
Lymphocytes Absolute: 1.6 10*3/uL (ref 0.7–3.1)
Lymphs: 29 %
MCH: 30.1 pg (ref 26.6–33.0)
MCHC: 32.6 g/dL (ref 31.5–35.7)
MCV: 93 fL (ref 79–97)
Monocytes Absolute: 0.4 10*3/uL (ref 0.1–0.9)
Monocytes: 8 %
Neutrophils Absolute: 3.3 10*3/uL (ref 1.4–7.0)
Neutrophils: 58 %
Platelets: 188 10*3/uL (ref 150–450)
RBC: 3.72 x10E6/uL — ABNORMAL LOW (ref 4.14–5.80)
RDW: 13.7 % (ref 11.6–15.4)
WBC: 5.7 10*3/uL (ref 3.4–10.8)

## 2023-05-28 LAB — LIPID PANEL
Chol/HDL Ratio: 2.8 ratio (ref 0.0–5.0)
Cholesterol, Total: 122 mg/dL (ref 100–199)
HDL: 44 mg/dL (ref >39)
LDL Chol Calc (NIH): 56 mg/dL (ref 0–99)
Triglycerides: 123 mg/dL (ref 0–149)
VLDL Cholesterol Cal: 22 mg/dL (ref 5–40)

## 2023-05-28 LAB — COMPREHENSIVE METABOLIC PANEL
ALT: 22 IU/L (ref 0–44)
AST: 17 IU/L (ref 0–40)
Albumin: 3.4 g/dL — ABNORMAL LOW (ref 3.8–4.9)
Alkaline Phosphatase: 141 IU/L — ABNORMAL HIGH (ref 44–121)
BUN/Creatinine Ratio: 28 — ABNORMAL HIGH (ref 10–24)
BUN: 19 mg/dL (ref 8–27)
Bilirubin Total: 0.3 mg/dL (ref 0.0–1.2)
CO2: 20 mmol/L (ref 20–29)
Calcium: 9.2 mg/dL (ref 8.6–10.2)
Chloride: 105 mmol/L (ref 96–106)
Creatinine, Ser: 0.68 mg/dL — ABNORMAL LOW (ref 0.76–1.27)
Globulin, Total: 3 g/dL (ref 1.5–4.5)
Glucose: 81 mg/dL (ref 70–99)
Potassium: 4.8 mmol/L (ref 3.5–5.2)
Sodium: 137 mmol/L (ref 134–144)
Total Protein: 6.4 g/dL (ref 6.0–8.5)
eGFR: 106 mL/min/{1.73_m2} (ref >59)

## 2023-05-28 LAB — PHOSPHORUS: Phosphorus: 3.7 mg/dL (ref 2.8–4.1)

## 2023-05-28 LAB — MAGNESIUM: Magnesium: 1.5 mg/dL — ABNORMAL LOW (ref 1.6–2.3)

## 2023-05-28 NOTE — Telephone Encounter (Signed)
Pt was called and vm was left. Information was sent to nurse pool and letter will be sent.   Interpreter: Hazle Coca

## 2023-05-28 NOTE — Telephone Encounter (Signed)
-----   Message from [name] sent at 05/28/2023  6:25 AM EDT ----- Let pt know magnesium is normal kidney liver normal, blood count normal, cholesterol at goal and is normal

## 2023-05-28 NOTE — Progress Notes (Signed)
Let pt know magnesium is normal kidney liver normal, blood count normal, cholesterol at goal and is normal

## 2023-05-29 LAB — MICROALBUMIN / CREATININE URINE RATIO
Creatinine, Urine: 21 mg/dL
Microalb/Creat Ratio: 548 mg/g{creat} — ABNORMAL HIGH (ref 0–29)
Microalbumin, Urine: 115.1 ug/mL

## 2023-05-30 LAB — URINE CULTURE: Organism ID, Bacteria: NO GROWTH

## 2023-05-31 ENCOUNTER — Telehealth: Payer: Self-pay

## 2023-05-31 NOTE — Telephone Encounter (Signed)
Allie with patient's [facility] team called requesting orders to remove patient's picc line. Per Dr. Ephriam Knuckles ov note picc can be removed after last dose on 05/29/23. Verbal orders given.  No updated OPAT note, so I have faxed over Dr. Ephriam Knuckles OV note.  Pryce Folts T Pricilla Loveless

## 2023-06-01 ENCOUNTER — Ambulatory Visit: Payer: Self-pay | Admitting: Critical Care Medicine

## 2023-06-01 NOTE — Addendum Note (Signed)
Addended by: Storm Frisk on: 06/01/2023 05:23 AM   Modules accepted: Orders

## 2023-06-01 NOTE — Telephone Encounter (Signed)
Message received from [facility] stating they are unable to provide the air mattress because they did not provide the bed.

## 2023-06-01 NOTE — Telephone Encounter (Signed)
I called [employer] [facility] to inquire if they provided the bed for the patient. I spoke to [place] who said she does not see a bed on the account.   I then called patient's wife, John Meza, and she did not know who provided the bed. The name on the side of the bed is [employer]: [phone].  Call was placed with assistance of Spanish interpreter: [phone]/[employer].   I then called [employer] customer service and was told that they would not know what DME company provided the bed for the patient    Community Message sent to Santina Evans, Laurell Roof New/ [employer] [facility] to inquire if there is any way they could process this order.  At this time, it would most likely be private pay

## 2023-06-01 NOTE — Telephone Encounter (Signed)
Order written

## 2023-06-03 ENCOUNTER — Encounter: Payer: Self-pay | Admitting: Internal Medicine

## 2023-06-03 ENCOUNTER — Ambulatory Visit (INDEPENDENT_AMBULATORY_CARE_PROVIDER_SITE_OTHER): Payer: Self-pay | Admitting: Orthopedic Surgery

## 2023-06-03 ENCOUNTER — Encounter: Payer: Self-pay | Admitting: Orthopedic Surgery

## 2023-06-03 DIAGNOSIS — Z89621 Acquired absence of right hip joint: Secondary | ICD-10-CM

## 2023-06-03 NOTE — Progress Notes (Signed)
Patient is a 62 year old gentleman who is 4 weeks status post right hip disarticulation.  The sutures have been removed the incision is clean and dry there is a few small areas of hypergranulation tissue about 2 mm in diameter and this was touched with silver nitrate.  Patient will continue with Dial soap cleansing dry dressing and a Mepilex dressing was applied today.  Patient has no complaints.

## 2023-06-17 NOTE — Telephone Encounter (Signed)
I called Molly/ [employer] and she said they don't carry air mattresses  or deal with beds.   She referred me to [facility]: [phone].  I spoke to Mary/ [facility] and she said they have air mattresses. Cost:$ 892.50 + tax.    I spoke to [facility] and said they have alternating pressure air mattresses:$546.00 + tax

## 2023-06-17 NOTE — Telephone Encounter (Signed)
Call received from [facility] stating that they have air mattresses and they start at $1500.  I called [facility] [facility]: [phone] to inquire if they have air mattresses. Message left with call back requested.

## 2023-06-17 NOTE — Telephone Encounter (Signed)
I called [employer]: [phone] regarding providing an air mattress if they have not provided the hospital bed and the patient is uninsured so it would be private. I had to leave a message requesting a call back.   I spoke to [place] and she said they have no record of the patient and they do not do out of pocket orders.  She then transferred me to [employer] and I spoke to April who confirmed they do out of pocket orders but they do not carry air mattresses    I spoke to Terry/[employer] and they only provide respiratory DEM /supplies  Message sent to Mike Gip, FNP / ostomy clinic inquiring if she is aware of a company that would provide an air mattress private pay.

## 2023-06-17 NOTE — Telephone Encounter (Signed)
I left a message for Vanessa/[employer] inquiring if they carry air mattresses and the cost.

## 2023-06-18 ENCOUNTER — Inpatient Hospital Stay: Payer: Self-pay | Attending: Hematology

## 2023-06-18 DIAGNOSIS — D509 Iron deficiency anemia, unspecified: Secondary | ICD-10-CM | POA: Insufficient documentation

## 2023-06-18 DIAGNOSIS — D638 Anemia in other chronic diseases classified elsewhere: Secondary | ICD-10-CM

## 2023-06-18 DIAGNOSIS — Z79899 Other long term (current) drug therapy: Secondary | ICD-10-CM | POA: Insufficient documentation

## 2023-06-18 LAB — CBC WITH DIFFERENTIAL/PLATELET
Abs Immature Granulocytes: 0.02 10*3/uL (ref 0.00–0.07)
Basophils Absolute: 0 10*3/uL (ref 0.0–0.1)
Basophils Relative: 1 %
Eosinophils Absolute: 0.1 10*3/uL (ref 0.0–0.5)
Eosinophils Relative: 2 %
HCT: 35 % — ABNORMAL LOW (ref 39.0–52.0)
Hemoglobin: 11.9 g/dL — ABNORMAL LOW (ref 13.0–17.0)
Immature Granulocytes: 0 %
Lymphocytes Relative: 29 %
Lymphs Abs: 1.9 10*3/uL (ref 0.7–4.0)
MCH: 31.7 pg (ref 26.0–34.0)
MCHC: 34 g/dL (ref 30.0–36.0)
MCV: 93.3 fL (ref 80.0–100.0)
Monocytes Absolute: 0.6 10*3/uL (ref 0.1–1.0)
Monocytes Relative: 9 %
Neutro Abs: 3.9 10*3/uL (ref 1.7–7.7)
Neutrophils Relative %: 59 %
Platelets: 194 10*3/uL (ref 150–400)
RBC: 3.75 MIL/uL — ABNORMAL LOW (ref 4.22–5.81)
RDW: 14.2 % (ref 11.5–15.5)
WBC: 6.5 10*3/uL (ref 4.0–10.5)
nRBC: 0 % (ref 0.0–0.2)

## 2023-06-18 LAB — IRON AND IRON BINDING CAPACITY (CC-WL,HP ONLY)
Iron: 56 ug/dL (ref 45–182)
Saturation Ratios: 27 % (ref 17.9–39.5)
TIBC: 210 ug/dL — ABNORMAL LOW (ref 250–450)
UIBC: 154 ug/dL (ref 117–376)

## 2023-06-18 LAB — FERRITIN: Ferritin: 153 ng/mL (ref 24–336)

## 2023-06-18 NOTE — Progress Notes (Unsigned)
This encounter was created in error - please disregard.

## 2023-06-21 ENCOUNTER — Other Ambulatory Visit: Payer: Self-pay

## 2023-06-21 ENCOUNTER — Ambulatory Visit (INDEPENDENT_AMBULATORY_CARE_PROVIDER_SITE_OTHER): Payer: Self-pay | Admitting: Orthopedic Surgery

## 2023-06-21 ENCOUNTER — Telehealth: Payer: Self-pay

## 2023-06-21 ENCOUNTER — Other Ambulatory Visit: Payer: Self-pay | Admitting: Critical Care Medicine

## 2023-06-21 ENCOUNTER — Encounter: Payer: Self-pay | Admitting: Hematology

## 2023-06-21 ENCOUNTER — Encounter: Payer: Self-pay | Admitting: Orthopedic Surgery

## 2023-06-21 DIAGNOSIS — Z89621 Acquired absence of right hip joint: Secondary | ICD-10-CM

## 2023-06-21 DIAGNOSIS — D500 Iron deficiency anemia secondary to blood loss (chronic): Secondary | ICD-10-CM

## 2023-06-21 MED ORDER — FERROUS SULFATE 325 (65 FE) MG PO TABS
325.0000 mg | ORAL_TABLET | Freq: Every day | ORAL | 0 refills | Status: DC
  Filled 2023-06-21: qty 30, 30d supply, fill #0

## 2023-06-21 NOTE — Telephone Encounter (Addendum)
Called patient and relayed message below as per Dr. Mosetta Putt. Patient voiced full understanding.   ----- Message from Malachy Mood sent at 06/21/2023  7:31 AM EDT ----- Please let pt know his anemia has improved, and iron level good, no need iv iron for now, thanks   Malachy Mood

## 2023-06-21 NOTE — Progress Notes (Signed)
Office Visit Note   Patient: John Meza           Date of Birth: 1960-10-29           MRN: [id number] Visit Date: 06/21/2023              Requested by: Storm Frisk, MD 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  Patient has no complaints.  Assessment & Plan: Visit Diagnoses:  1. History of disarticulation of right hip     Plan: The incision is well-healed.  Recommended scar massage.  A prescription was provided for [employer] for prosthesis.  Patient states he does not have insurance.  Follow-Up Instructions: No follow-ups on file.   Ortho Exam  Patient is alert, oriented, no adenopathy, well-dressed, normal affect, normal respiratory effort. Examination the surgical incision is well-healed.  There is no cellulitis odor or drainage.  Recommended scar massage.  Imaging: No results found. No images are attached to the encounter.  Labs: Lab Results  Component Value Date   HGBA1C 7.0 (H) 04/12/2023   HGBA1C 6.5 (H) 10/28/2022   HGBA1C 5.8 (H) 07/23/2022   ESRSEDRATE 60 (H) 04/25/2023   ESRSEDRATE 125 (H) 03/16/2022   ESRSEDRATE 80 (H) 11/03/2016   CRP 19.5 (H) 04/25/2023   CRP 229.2 (H) 03/16/2022   REPTSTATUS 05/06/2023 FINAL 05/01/2023   GRAMSTAIN FEW WBC SEEN NO ORGANISMS SEEN  05/01/2023   CULT  05/01/2023    RARE ESCHERICHIA COLI Confirmed Extended Spectrum Beta-Lactamase Producer (ESBL).  In bloodstream infections from ESBL organisms, carbapenems are preferred over piperacillin/tazobactam. They are shown to have a lower risk of mortality. NO ANAEROBES ISOLATED Performed at [facility], [address], [place], [place] [address]    [facility] ESCHERICHIA COLI 05/01/2023     Lab Results   Component Value Date   ALBUMIN 3.4 (L) 05/27/2023   ALBUMIN 1.6 (L) 04/30/2023   ALBUMIN 1.9 (L) 04/23/2023    Lab Results  Component Value Date   MG 1.5 (L) 05/27/2023   MG 1.7 05/04/2023   MG 1.6 (L) 05/03/2023   Lab Results  Component Value Date   VD25OH 24.0 (L) 10/28/2022   VD25OH 10.2 (L) 05/19/2022    No results found for: "PREALBUMIN"    Latest Ref Rng & Units 06/18/2023   10:44 AM 05/27/2023    9:38 AM 05/04/2023    5:00 AM  CBC EXTENDED  WBC 4.0 - 10.5 K/uL 6.5  5.7  9.5   RBC 4.22 - 5.81 MIL/uL 3.75  3.72  3.48   Hemoglobin 13.0 - 17.0 g/dL 84.1  66.0  63.0   HCT 39.0 - 52.0 % 35.0  34.4  30.8   Platelets 150 - 400 K/uL 194  188  244   NEUT# 1.7 - 7.7 K/uL 3.9  3.3    Lymph# 0.7 - 4.0 K/uL 1.9  1.6       There is no height or weight on file to calculate BMI.  Orders:  No orders of the defined types were placed in this encounter.  No orders of the defined types were placed in this encounter.    Procedures: No procedures performed  Clinical Data: No additional  findings.  ROS:  All other systems negative, except as noted in the HPI. Review of Systems  Objective: Vital Signs: There were no vitals taken for this visit.  Specialty Comments:  No specialty comments available.  PMFS History: Patient Active Problem List   Diagnosis Date Noted   Medication management 05/25/2023   Osteomyelitis of right hip (HCC) 04/28/2023   History of infection due to extended spectrum beta lactamase (ESBL) producing bacteria 04/11/2023   Hyperlipidemia associated with type 2 diabetes mellitus (HCC) 12/01/2022   Dental caries 12/01/2022   Iron deficiency anemia due to chronic blood loss 11/14/2022   Anemia of chronic disease 11/14/2022   Medication monitoring encounter 04/21/2022   PICC (peripherally inserted central catheter) in place 03/16/2022   Normocytic anemia 03/03/2022   Wheelchair dependence 12/23/2020   History of cholecystectomy 11/18/2020    Essential hypertension    Neurogenic bladder 01/06/2012   Neurogenic bowel 01/06/2012   Paraplegia (HCC) 11/16/2011   Controlled type 2 diabetes mellitus (HCC) 05/26/2007   OBESITY, MODERATE 05/26/2007   Past Medical History:  Diagnosis Date   Cellulitis and abscess of buttock 10/2016   Diabetes mellitus    ESBL (extended spectrum beta-lactamase) producing bacteria infection 03/16/2022   Hyperkalemia 03/16/2022   Hypertension    Myositis 04/21/2022   Paraplegia (HCC) 2013   fell from ladder   Pelvic abscess in male [facility]) 05/01/2023   Pressure ulcer, buttock, right, unstageable (HCC) 12/30/2020   Pyelonephritis due to Escherichia coli 03/16/2022   Sacral decubitus ulcer, stage IV (HCC) 11/03/2021   SCI (spinal cord injury)    Sepsis due to Escherichia coli (E. coli) (HCC) 03/01/2022   Septic hip (HCC) 04/28/2023   Spine fracture 11/16/2011   T 11- T9-L1    Family History  Problem Relation Age of Onset   Healthy Mother    Diabetes Father    Diabetes Sister     Past Surgical History:  Procedure Laterality Date   APPLICATION OF WOUND VAC Right 05/01/2023   Procedure: APPLICATION OF WOUND VAC;  Surgeon: [name], MD;  Location: [facility] OR;  Service: Orthopedics;  Laterality: Right;   CHOLECYSTECTOMY N/A 05/01/2015   Procedure: LAPAROSCOPIC CHOLECYSTECTOMY WITH ATTEMPTED INTRAOPERATIVE CHOLANGIOGRAM;  Surgeon: Violeta Gelinas, MD;  Location: [facility] OR;  Service: General;  Laterality: N/A;   HIP DISARTICULATION Right 05/01/2023   Procedure: RIGHT HIP DISARTICULATION;  Surgeon: [name], MD;  Location: [facility] OR;  Service: Orthopedics;  Laterality: Right;   IR US GUIDE BX ASP/DRAIN  04/27/2023   SPINE SURGERY     TEE WITHOUT CARDIOVERSION N/A 03/26/2022   Procedure: TRANSESOPHAGEAL ECHOCARDIOGRAM (TEE);  Surgeon: Elease Hashimoto Deloris Ping, MD;  Location[facility] ENDOSCOPY;  Service: Cardiovascular;  Laterality: N/A;   WOUND EXPLORATION Right 05/01/2023   Procedure: WOUND EXPLORATION OF HIP;  Surgeon:  [name], MD;  Location: [facility] OR;  Service: Orthopedics;  Laterality: Right;   Social History   Occupational History   Occupation: disabled  Tobacco Use   Smoking status: Former    Current packs/day: 0.00    Average packs/day: 1 pack/day for 5.0 years (5.0 ttl pk-yrs)    Types: Cigarettes    Start date: 10/12/2006    Quit date: 10/13/2011    Years since quitting: 11.6   Smokeless tobacco: Never   Tobacco comments:    03-24-19 per pt he stopped 1 mo ago   Vaping Use   Vaping status: Never Used  Substance and Sexual Activity   Alcohol use: Not  Currently    Alcohol/week: 1.0 standard drink of alcohol    Types: 1 Cans of beer per week   Drug use: Never   Sexual activity: Not on file

## 2023-06-25 ENCOUNTER — Other Ambulatory Visit: Payer: Self-pay

## 2023-07-01 ENCOUNTER — Ambulatory Visit: Payer: Self-pay | Admitting: Orthopedic Surgery

## 2023-07-02 ENCOUNTER — Telehealth: Payer: Self-pay

## 2023-07-02 NOTE — Telephone Encounter (Signed)
Call placed to patient and his wife with assistance of Spanish interpreter: [phone]/[employer] to inform them of the options for air mattresses.  Message left with call back requested.   The patient is uninsured and this would be private pay.

## 2023-07-06 ENCOUNTER — Encounter (HOSPITAL_COMMUNITY): Payer: Self-pay | Admitting: Orthopedic Surgery

## 2023-07-14 ENCOUNTER — Telehealth: Payer: Self-pay

## 2023-07-14 NOTE — Telephone Encounter (Signed)
Copied from CRM [id number]. Topic: General - Inquiry >> Jul 14, 2023 10:11 AM De Blanch wrote: Reason for CRM: Pt wife stated she applied for Urgent Medicaid and stated she mailed it in. She also dropped off paperwork with Mikle Bosworth the last week of July or first week of August and has not heard back.  Please advise.

## 2023-07-30 ENCOUNTER — Other Ambulatory Visit (HOSPITAL_COMMUNITY): Payer: Self-pay

## 2023-07-30 ENCOUNTER — Other Ambulatory Visit: Payer: Self-pay | Admitting: Critical Care Medicine

## 2023-07-30 ENCOUNTER — Other Ambulatory Visit: Payer: Self-pay

## 2023-07-30 ENCOUNTER — Encounter: Payer: Self-pay | Admitting: Hematology

## 2023-07-30 MED ORDER — PANTOPRAZOLE SODIUM 40 MG PO TBEC
40.0000 mg | DELAYED_RELEASE_TABLET | Freq: Every day | ORAL | 0 refills | Status: DC
  Filled 2023-07-30: qty 90, 90d supply, fill #0

## 2023-07-30 NOTE — Telephone Encounter (Signed)
Requested medications are due for refill today.  yes  Requested medications are on the active medications list.  yes  Last refill. 05/05/2023 #30 1 rf  Future visit scheduled.   yes  Notes to clinic.  Dr. [name] listed as PCP. Rx signed by Willeen Niece    Requested Prescriptions  Pending Prescriptions Disp Refills   pantoprazole (PROTONIX) 40 MG tablet 30 tablet 1    Sig: Take 1 tablet (40 mg total) by mouth at bedtime.     Gastroenterology: Proton Pump Inhibitors Passed - 07/30/2023  3:02 PM      Passed - Valid encounter within last 12 months    Recent Outpatient Visits           2 months ago Abscess of right hip   [facility] Storm Frisk, MD   6 months ago Controlled type 2 diabetes mellitus with hyperglycemia, without long-term current use of insulin [facility])   [place] [facility] Storm Frisk, MD   8 months ago Controlled type 2 diabetes mellitus with hyperglycemia, without long-term current use of insulin [facility])   [place] [facility] Storm Frisk, MD   8 months ago Insulin dependent type 2 diabetes mellitus, controlled [facility])   [facility], Amy J, NP   9 months ago Insulin dependent type 2 diabetes mellitus, controlled [facility] [place] [facility])   [place] [facility] [place], Marzella Schlein, [place]       Future Appointments             In 2 months Hoy Register, MD [facility]

## 2023-08-02 ENCOUNTER — Ambulatory Visit: Payer: Self-pay

## 2023-08-02 ENCOUNTER — Other Ambulatory Visit: Payer: Self-pay

## 2023-08-02 NOTE — Telephone Encounter (Signed)
     Chief Complaint: Right rib pain. "He had this pain when his leg was infected and he was in the hospital." Concerned he may have another infection. Symptoms: Pain Frequency: 2-3 days ago Pertinent Negatives: Patient denies  Disposition: [] ED /[] Urgent Care (no appt availability in office) / [x] Appointment(In office/virtual)/ []  [place] [place] [facility]/ [] Home Care/ [] Refused Recommended Disposition /[] [facility] [facility]/ []  Follow-up with PCP Additional Notes: Wife agrees with appointment.  Reason for Disposition  [1] Chest pain lasts > 5 minutes AND [2] occurred > 3 days ago (72 hours) AND [3] NO chest pain or cardiac symptoms now  Answer Assessment - Initial Assessment Questions 1. LOCATION: "Where does it hurt?"       Right rib pain 2. RADIATION: "Does the pain go anywhere else?" (e.g., into neck, jaw, arms, back)     No 3. ONSET: "When did the chest pain begin?" (Minutes, hours or days)      2 days ago 4. PATTERN: "Does the pain come and go, or has it been constant since it started?"  "Does it get worse with exertion?"      Constant 5. DURATION: "How long does it last" (e.g., seconds, minutes, hours)     Hours 6. SEVERITY: "How bad is the pain?"  (e.g., Scale 1-10; mild, moderate, or severe)    - MILD (1-3): doesn't interfere with normal activities     - MODERATE (4-7): interferes with normal activities or awakens from sleep    - SEVERE (8-10): excruciating pain, unable to do any normal activities       Moderate 7. CARDIAC RISK FACTORS: "Do you have any history of heart problems or risk factors for heart disease?" (e.g., angina, prior heart attack; diabetes, high blood pressure, high cholesterol, smoker, or strong family history of heart disease)     No 8. PULMONARY RISK FACTORS: "Do you have any history of lung disease?"  (e.g., blood clots in lung, asthma, emphysema, birth control pills)     No 9. CAUSE: "What do you think is causing the chest pain?"     Unsure 10.  OTHER SYMPTOMS: "Do you have any other symptoms?" (e.g., dizziness, nausea, vomiting, sweating, fever, difficulty breathing, cough)       Diarrhea 11. PREGNANCY: "Is there any chance you are pregnant?" "When was your last menstrual period?"       N/a  Protocols used: Chest Pain-A-AH

## 2023-08-03 ENCOUNTER — Encounter: Payer: Self-pay | Admitting: Family Medicine

## 2023-08-03 ENCOUNTER — Other Ambulatory Visit: Payer: Self-pay

## 2023-08-03 ENCOUNTER — Ambulatory Visit: Payer: Self-pay | Attending: Family Medicine | Admitting: Family Medicine

## 2023-08-03 ENCOUNTER — Encounter: Payer: Self-pay | Admitting: Hematology

## 2023-08-03 VITALS — BP 152/73 | HR 72

## 2023-08-03 DIAGNOSIS — Z23 Encounter for immunization: Secondary | ICD-10-CM

## 2023-08-03 DIAGNOSIS — R109 Unspecified abdominal pain: Secondary | ICD-10-CM

## 2023-08-03 DIAGNOSIS — N390 Urinary tract infection, site not specified: Secondary | ICD-10-CM

## 2023-08-03 DIAGNOSIS — N3001 Acute cystitis with hematuria: Secondary | ICD-10-CM

## 2023-08-03 DIAGNOSIS — Z7984 Long term (current) use of oral hypoglycemic drugs: Secondary | ICD-10-CM

## 2023-08-03 DIAGNOSIS — I10 Essential (primary) hypertension: Secondary | ICD-10-CM

## 2023-08-03 LAB — POCT URINALYSIS DIP (CLINITEK)
Bilirubin, UA: NEGATIVE
Glucose, UA: NEGATIVE mg/dL
Ketones, POC UA: NEGATIVE mg/dL
Nitrite, UA: POSITIVE — AB
POC PROTEIN,UA: >=300 — AB
Spec Grav, UA: 1.015 (ref 1.010–1.025)
Urobilinogen, UA: 0.2 U/dL
pH, UA: 6 (ref 5.0–8.0)

## 2023-08-03 MED ORDER — SIMETHICONE 180 MG PO CAPS
1.0000 | ORAL_CAPSULE | Freq: Three times a day (TID) | ORAL | 1 refills | Status: DC
Start: 2023-08-03 — End: 2023-12-01
  Filled 2023-08-03 (×2): qty 90, 30d supply, fill #0

## 2023-08-03 MED ORDER — CEPHALEXIN 500 MG PO CAPS
500.0000 mg | ORAL_CAPSULE | Freq: Two times a day (BID) | ORAL | 0 refills | Status: DC
  Filled 2023-08-03: qty 14, 7d supply, fill #0

## 2023-08-03 NOTE — Patient Instructions (Signed)
Dolor abdominal en los adultos Abdominal Pain, Adult  El dolor en el abdomen (dolor abdominal) puede tener muchas causas. En la [employer], [place] sin tratamiento o al recibir Medical laboratory scientific officer. Pero en [employer], puede ser grave. El mdico le har preguntas sobre sus antecedentes mdicos y le har un examen fsico para tratar de comprender la causa del dolor. Siga estas instrucciones en su casa: Medicamentos [employer] de venta libre y los recetados solamente como se lo haya indicado el mdico. No tome medicamentos que lo ayuden a Advertising copywriter (laxantes), salvo que el mdico se lo indique. Instrucciones generales Controle su afeccin para detectar cualquier cambio. Beber suficiente lquido para Radio producer pis (orina) de color amarillo plido. Comunquese con un mdico si: El dolor cambia, empeora o dura ms de lo previsto. Tiene clicos o distensin abdominal intensos, o vomita. El dolor empeora con las comidas, despus de comer o con determinados alimentos. Est estreido o tiene diarrea durante ms de 2 o 3 das. No tiene apetito o baja de peso sin proponrselo. Tiene signos de deshidratacin. Pueden incluir: [place], muy escasa o falta de [place]. Labios agrietados o Building surveyor. Somnolencia o debilidad. Tiene dolor al hacer pis (orinar) o al defecar. El dolor abdominal lo despierta de noche. Observa sangre en la orina. Tiene fiebre. Solicite ayuda de inmediato si: No puede dejar de vomitar. El dolor solo se localiza en una parte del abdomen. Si se localiza en la zona derecha, posiblemente podra tratarse de apendicitis. Produce materia fecal (heces) con sangre, de color negro, o con aspecto alquitranado. Tiene dificultad para respirar. Tiene dolor en el pecho. Estos sntomas pueden Customer service manager. Solicite ayuda de inmediato. Llame al 911. No espere a ver si los sntomas desaparecen. No conduzca por sus propios medios [employer]. Esta  informacin no tiene Theme park manager el consejo del mdico. Asegrese de hacerle al mdico cualquier pregunta que tenga. Document Revised: 11/07/2022 Document Reviewed: 11/07/2022 Elsevier Patient Education  2024 [employer].

## 2023-08-03 NOTE — Progress Notes (Signed)
Subjective:  Patient ID: John Meza, male    DOB: October 07, 1961  Age: 62 y.o. MRN: [id number]  CC: Abdominal Pain (Right side abdominal pain)   HPI John Meza is a 62 y.o. year old male patient of Dr. [name] with a history of HTN, Paraplegia, neurogenic bladder, type 2 DM, right hip disarticulation.  Interval History: Discussed the use of AI scribe software for clinical note transcription with the patient, who gave verbal consent to proceed.   The patient presents with intermittent right-sided abdominal pain, described as 'cramps and poking sensations.' The pain is located around the umbilicus and radiates to the right lower quadrant. He denies associated nausea and vomiting, but reports excessive gas and bloating, particularly after meals. He has noticed that fatty foods sometimes cause diarrhea. The patient has been taking pantoprazole for acid reflux, but has discontinued famotidine. He has a history of cholecystectomy CT scan of the abdomen from 04/2023 revealed: IMPRESSION: 1. When compared to the recent prior examination from 04/29/2022, there is resorption of the right femoral head, neck and portion of the right proximal femur. There is large collection with mild irregular hyperattenuating wall surrounding the right hip joint. Correlate clinically for septic arthritis. 2. Otherwise, no acute inflammatory process identified within the abdomen or pelvis. 3. Multiple other nonacute observations, as described above.     He would like his urine checked as he states he is prone to UTIs.       Past Medical History:  Diagnosis Date   Cellulitis and abscess of buttock 10/2016   Diabetes mellitus    ESBL (extended spectrum beta-lactamase) producing bacteria infection 03/16/2022   Hyperkalemia 03/16/2022   Hypertension    Myositis 04/21/2022   Paraplegia (HCC) 2013   fell from ladder   Pelvic abscess in male [facility]) 05/01/2023   Pressure ulcer,  buttock, right, unstageable (HCC) 12/30/2020   Pyelonephritis due to Escherichia coli 03/16/2022   Sacral decubitus ulcer, stage IV (HCC) 11/03/2021   SCI (spinal cord injury)    Sepsis due to Escherichia coli (E. coli) (HCC) 03/01/2022   Septic hip (HCC) 04/28/2023   Spine fracture 11/16/2011   T 11- T9-L1    Past Surgical History:  Procedure Laterality Date   APPLICATION OF WOUND VAC Right 05/01/2023   Procedure: APPLICATION OF WOUND VAC;  Surgeon: [name], MD;  Location: [facility] OR;  Service: Orthopedics;  Laterality: Right;   CHOLECYSTECTOMY N/A 05/01/2015   Procedure: LAPAROSCOPIC CHOLECYSTECTOMY WITH ATTEMPTED INTRAOPERATIVE CHOLANGIOGRAM;  Surgeon: Violeta Gelinas, MD;  Location: [facility] OR;  Service: General;  Laterality: N/A;   HIP DISARTICULATION Right 05/01/2023   Procedure: RIGHT HIP DISARTICULATION;  Surgeon: [name], MD;  Location: [facility] OR;  Service: Orthopedics;  Laterality: Right;   IR US GUIDE BX ASP/DRAIN  04/27/2023   SPINE SURGERY     TEE WITHOUT CARDIOVERSION N/A 03/26/2022   Procedure: TRANSESOPHAGEAL ECHOCARDIOGRAM (TEE);  Surgeon: Elease Hashimoto Deloris Ping, MD;  Location: [facility] ENDOSCOPY;  Service: Cardiovascular;  Laterality: N/A;   WOUND EXPLORATION Right 05/01/2023   Procedure: WOUND EXPLORATION OF HIP;  Surgeon: [name], MD;  Location: [facility] OR;  Service: Orthopedics;  Laterality: Right;    Family History  Problem Relation Age of Onset   Healthy Mother    Diabetes Father    Diabetes Sister     Social History   Socioeconomic History   Marital status: Married    Spouse name: Not on file   Number of children: Not on file  Years of education: Not on file   Highest education level: Not on file  Occupational History   Occupation: disabled  Tobacco Use   Smoking status: Former    Current packs/day: 0.00    Average packs/day: 1 pack/day for 5.0 years (5.0 ttl pk-yrs)    Types: Cigarettes    Start date: 10/12/2006    Quit date: 10/13/2011    Years since quitting:  11.8   Smokeless tobacco: Never   Tobacco comments:    03-24-19 per pt he stopped 1 mo ago   Vaping Use   Vaping status: Never Used  Substance and Sexual Activity   Alcohol use: Not Currently    Alcohol/week: 1.0 standard drink of alcohol    Types: 1 Cans of beer per week   Drug use: Never   Sexual activity: Not on file  Other Topics Concern   Not on file  Social History Narrative   Not on file   Social Determinants of Health   Financial Resource Strain: Not on file  Food Insecurity: No Food Insecurity (04/30/2023)   Hunger Vital Sign    Worried About Running Out of Food in the Last Year: Never true    Ran Out of Food in the Last Year: Never true  Transportation Needs: No Transportation Needs (04/30/2023)   PRAPARE - Administrator, Civil Service (Medical): No    Lack of Transportation (Non-Medical): No  Physical Activity: Not on file  Stress: Not on file  Social Connections: Unknown (02/22/2022)   Received from [facility]   Social Network    Social Network: Not on file    Allergies  Allergen Reactions   Macrobid [Nitrofurantoin] Itching and Rash    Outpatient Medications Prior to Visit  Medication Sig Dispense Refill   atorvastatin (LIPITOR) 20 MG tablet Take 1 tablet (20 mg total) by mouth daily at 12 noon. 90 tablet 2   famotidine (PEPCID) 20 MG tablet Take 1 tablet (20 mg total) by mouth 2 (two) times daily. 60 tablet 6   metFORMIN (GLUCOPHAGE) 1000 MG tablet Take 1 tablet (1,000 mg total) by mouth 2 (two) times daily with a meal. 180 tablet 3   pantoprazole (PROTONIX) 40 MG tablet Take 1 tablet (40 mg total) by mouth at bedtime. 90 tablet 0   sitaGLIPtin (JANUVIA) 100 MG tablet Take 1 tablet (100 mg total) by mouth daily. 90 tablet 3   valsartan-hydrochlorothiazide (DIOVAN-HCT) 320-25 MG tablet Take 1 tablet by mouth daily. 90 tablet 3   Vitamin D, Ergocalciferol, (DRISDOL) 1.25 MG (50000 UNIT) CAPS capsule Take 1 capsule (50,000 Units  total) by mouth every 7 (seven) days. Please get updated vit D level. 4 capsule 0   ferrous sulfate (FEROSUL) 325 (65 FE) MG tablet Take 1 tablet (325 mg total) by mouth daily. (Patient not taking: Reported on 08/03/2023) 30 tablet 0   No facility-administered medications prior to visit.     ROS Review of Systems  Constitutional:  Negative for activity change and appetite change.  HENT:  Negative for sinus pressure and sore throat.   Respiratory:  Negative for chest tightness, shortness of breath and wheezing.   Cardiovascular:  Negative for chest pain and palpitations.  Gastrointestinal:  Positive for abdominal distention. Negative for abdominal pain and constipation.  Genitourinary: Negative.   Musculoskeletal: Negative.   Psychiatric/Behavioral:  Negative for behavioral problems and dysphoric mood.    Objective:  BP (!) 152/73   Pulse 72   SpO2 100%  08/03/2023    8:44 AM 05/27/2023    9:07 AM 05/25/2023    1:51 PM  BP/Weight  Systolic BP 152 109 124  Diastolic BP 73 65 74  Wt. (Lbs)  --       Physical Exam Constitutional:      Appearance: He is well-developed.  Cardiovascular:     Rate and Rhythm: Normal rate.     Heart sounds: Normal heart sounds. No murmur heard. Pulmonary:     Effort: Pulmonary effort is normal.     Breath sounds: Normal breath sounds. No wheezing or rales.  Chest:     Chest wall: No tenderness.  Abdominal:     General: Bowel sounds are normal. There is no distension.     Palpations: Abdomen is soft. There is no mass.     Tenderness: There is no abdominal tenderness.  Musculoskeletal:        General: Normal range of motion.     Left lower leg: No edema.     Comments: Absent right lower extremity  Neurological:     Mental Status: He is alert and oriented to person, place, and time.  Psychiatric:        Mood and Affect: Mood normal.        Latest Ref Rng & Units 05/27/2023    9:38 AM 05/03/2023    4:00 AM 05/02/2023    5:40 AM   CMP  Glucose 70 - 99 mg/dL 81  95  629   BUN 8 - 27 mg/dL 19  14  12    Creatinine 0.76 - 1.27 mg/dL 5.28  4.13  2.44   Sodium 134 - 144 mmol/L 137  136  134   Potassium 3.5 - 5.2 mmol/L 4.8  3.7  3.4   Chloride 96 - 106 mmol/L 105  105  106   CO2 20 - 29 mmol/L 20  24  23    Calcium 8.6 - 10.2 mg/dL 9.2  8.1  8.0   Total Protein 6.0 - 8.5 g/dL 6.4     Total Bilirubin 0.0 - 1.2 mg/dL 0.3     Alkaline Phos 44 - 121 IU/L 141     AST 0 - 40 IU/L 17     ALT 0 - 44 IU/L 22       Lipid Panel     Component Value Date/Time   CHOL 122 05/27/2023 0938   TRIG 123 05/27/2023 0938   HDL 44 05/27/2023 0938   CHOLHDL 2.8 05/27/2023 0938   CHOLHDL 11.5 05/05/2015 0352   VLDL 35 05/05/2015 0352   LDLCALC 56 05/27/2023 0938    CBC    Component Value Date/Time   WBC 6.5 06/18/2023 1044   RBC 3.75 (L) 06/18/2023 1044   HGB 11.9 (L) 06/18/2023 1044   HGB 11.2 (L) 05/27/2023 0938   HCT 35.0 (L) 06/18/2023 1044   HCT 34.4 (L) 05/27/2023 0938   PLT 194 06/18/2023 1044   PLT 188 05/27/2023 0938   MCV 93.3 06/18/2023 1044   MCV 93 05/27/2023 0938   MCH 31.7 06/18/2023 1044   MCHC 34.0 06/18/2023 1044   RDW 14.2 06/18/2023 1044   RDW 13.7 05/27/2023 0938   LYMPHSABS 1.9 06/18/2023 1044   LYMPHSABS 1.6 05/27/2023 0938   MONOABS 0.6 06/18/2023 1044   EOSABS 0.1 06/18/2023 1044   EOSABS 0.3 05/27/2023 0938   BASOSABS 0.0 06/18/2023 1044   BASOSABS 0.1 05/27/2023 0102    Lab Results  Component Value Date  HGBA1C 7.0 (H) 04/12/2023    Assessment & Plan:      Abdominal Pain Epigastric and periumbilical pain with cramping and poking sensations. No associated nausea or vomiting. Pain is intermittent and associated with bloating and gas, particularly after meals. Patient has a history of cholecystectomy which may contribute to postprandial symptoms. -Stop PPI (Pantoprazole) for two weeks. -Order H. pylori breath test after two weeks off PPI. -Prescribe Gas-X for symptomatic relief of  gas and bloating.  Urinary Tract Infection Patient has a history of recurrent UTIs the setting of neurogenic bladder. Current urinalysis shows hematuria and positive leukocyte esterase. -Send urine for culture and sensitivity. -Start empiric antibiotic therapy with Keflex. Adjust based on culture results.  Hypertension Blood pressure elevated on repeat measurements. Patient is currently on antihypertensive medication and following a low sodium diet. -Continue current antihypertensive medication and low sodium diet. -Recheck blood pressure at next visit.  General Health Maintenance -Administer influenza vaccine today.          Meds ordered this encounter  Medications   Simethicone 180 MG CAPS    Sig: Take 1 capsule (180 mg total) by mouth 3 (three) times daily.    Dispense:  90 capsule    Refill:  1   cephALEXin (KEFLEX) 500 MG capsule    Sig: Take 1 capsule (500 mg total) by mouth 2 (two) times daily.    Dispense:  14 capsule    Refill:  0    Follow-up: Return in about 6 weeks (around 09/14/2023) for Chronic medical conditions.       Hoy Register, MD, FAAFP. [facility] [place], [place] [phone]   08/03/2023, 1:57 PM

## 2023-08-04 ENCOUNTER — Encounter: Payer: Self-pay | Admitting: Hematology

## 2023-08-04 ENCOUNTER — Other Ambulatory Visit: Payer: Self-pay

## 2023-08-08 LAB — URINE CULTURE

## 2023-08-17 ENCOUNTER — Ambulatory Visit: Payer: Self-pay | Attending: Critical Care Medicine

## 2023-08-17 DIAGNOSIS — R109 Unspecified abdominal pain: Secondary | ICD-10-CM

## 2023-08-17 NOTE — Telephone Encounter (Signed)
I met with the patient and his wife when they were in the clinic today for his labwork.  John Meza interpreted. I explained that I had tried to reach them about obtaining an air mattress for the patient.  They are uninsured and the cheapest mattress I found was at [facility] for $546.00.  The patients wife noted that they would not be able to afford that.  I told her that I will request financial assistance from the Patient Assistance Fund for this item.  Patient's wife was very Adult nurse and signed the Patient Assistance Acknowledgement.

## 2023-08-19 LAB — H. PYLORI BREATH COLLECTION

## 2023-08-19 LAB — H. PYLORI BREATH TEST: H pylori Breath Test: NEGATIVE

## 2023-08-19 NOTE — Telephone Encounter (Signed)
Message sent to [facility] Patient [facility] requesting financial assistance with purchasing the air mattress for the patient even though he has already received assistance in the past for purchasing urinary catheters.

## 2023-08-20 ENCOUNTER — Telehealth: Payer: Self-pay | Admitting: Critical Care Medicine

## 2023-08-20 ENCOUNTER — Telehealth: Payer: Self-pay

## 2023-08-20 NOTE — Telephone Encounter (Signed)
Pt was called and vm was left, Information has been sent to nurse pool.   Interpreter id # [phone]

## 2023-08-20 NOTE — Telephone Encounter (Signed)
-----   Message from [facility] sent at 08/19/2023 12:45 PM EST ----- Please inform him that his H. pylori test is negative.  Thank you.

## 2023-08-20 NOTE — Telephone Encounter (Signed)
Pt is returning a call for lab results.

## 2023-08-20 NOTE — Telephone Encounter (Signed)
Returned pt's call. Shared provider's note.  Hoy Register, MD 08/19/2023 12:45 PM EST     Please inform him that his H. pylori test is negative.  Thank you.   Called pt and shared provider's note.

## 2023-08-25 ENCOUNTER — Ambulatory Visit: Payer: Self-pay | Admitting: *Deleted

## 2023-08-25 NOTE — Telephone Encounter (Signed)
Reason for Disposition  [1] Patient says chest pain feels exactly the same as previously diagnosed "heartburn" AND [2] describes burning in chest AND [3] accompanying sour taste in mouth  Answer Assessment - Initial Assessment Questions 1. LOCATION: "Where does it hurt?"       Wife Evangeline Dakin calling in for husband.   She put him on the phone. Called with Spanish interpreter.     I'm having pain on my right side in my ribs.   It's not all the time.  Except laying down it hurts.   I think this is from acid reflux.   The provider did a test for bacteria but it wasn't.   I think I still may have bacteria causing reflux. It's beside my ribs where the pain is.   No burning in throat.   No burping or nausea. This started a few days ago.    2. RADIATION: "Does the pain go anywhere else?" (e.g., into neck, jaw, arms, back)     No 3. ONSET: "When did the chest pain begin?" (Minutes, hours or days)      A few days ago  4. PATTERN: "Does the pain come and go, or has it been constant since it started?"  "Does it get worse with exertion?"      Intermittent 5. DURATION: "How long does it last" (e.g., seconds, minutes, hours)     Not asked 6. SEVERITY: "How bad is the pain?"  (e.g., Scale 1-10; mild, moderate, or severe)    - MILD (1-3): doesn't interfere with normal activities     - MODERATE (4-7): interferes with normal activities or awakens from sleep    - SEVERE (8-10): excruciating pain, unable to do any normal activities       I was taken medication but the nurse told me to stop it.   When it happens I take it and it helps.   The medicine is Famotidine (Pepcid). 7. CARDIAC RISK FACTORS: "Do you have any history of heart problems or risk factors for heart disease?" (e.g., angina, prior heart attack; diabetes, high blood pressure, high cholesterol, smoker, or strong family history of heart disease)     Not asked 8. PULMONARY RISK FACTORS: "Do you have any history of lung disease?"  (e.g., blood clots  in lung, asthma, emphysema, birth control pills)     Not asked 9. CAUSE: "What do you think is causing the chest pain?"     Reflux 10. OTHER SYMPTOMS: "Do you have any other symptoms?" (e.g., dizziness, nausea, vomiting, sweating, fever, difficulty breathing, cough)       No 11. PREGNANCY: "Is there any chance you are pregnant?" "When was your last menstrual period?"       N/A  Protocols used: Chest Pain-A-AH  Chief Complaint: Having pain beside his ribs on the right.   When he takes Pepcid it goes away.   Been seen for this pain before.    Symptoms: "Acid reflux from bacteria"  above Frequency: Intermittently Pertinent Negatives: Patient denies vomiting Disposition: [] ED /[] Urgent Care (no appt availability in office) / [x] Appointment(In office/virtual)/ []  [facility]/ [] Home Care/ [] Refused Recommended Disposition /[] [place] [facility]/ []  Follow-up with PCP Additional Notes: Appt made for 08/26/2023 with Dr. Jonah Blue for 3:50.

## 2023-08-26 ENCOUNTER — Ambulatory Visit: Payer: Self-pay | Attending: Internal Medicine | Admitting: Internal Medicine

## 2023-08-26 ENCOUNTER — Encounter: Payer: Self-pay | Admitting: Internal Medicine

## 2023-08-26 VITALS — BP 149/76 | HR 85 | Temp 98.0°F | Ht 64.0 in

## 2023-08-26 DIAGNOSIS — R1011 Right upper quadrant pain: Secondary | ICD-10-CM

## 2023-08-26 DIAGNOSIS — K219 Gastro-esophageal reflux disease without esophagitis: Secondary | ICD-10-CM

## 2023-08-26 NOTE — Progress Notes (Signed)
Patient ID: John Meza, male    DOB: 03-08-1961  MRN: [id number]  CC: Heartburn (Acid reflux/Pain on R side of abdomen x4 mo - previously had infection on bone of R leg & amputated in july)   Subjective: John Meza is a 62 y.o. male who presents for UC visit management.  Wife is with him.  Previous PCP Dr. [name].  Saw Dr. Alvis Lemmings 07/29/2023 His concerns today include:  62 y.o. year old male patient of Dr. [name] with a history of HTN, Paraplegia, neurogenic bladder, type 2 DM, right hip disarticulation with pelvic abscess.   [employer] Language interpreter used during this encounter. #[id number] Alejandra  Patient presents for urgent care complaining of intermittent pain in the right upper quadrant of the abdomen x 3 to 4 months.  He has been having increased acid reflux.  States that it stings and burns in this area when his acid reflux acts up. No N/V or fever. Supposed to be on famotidine and pantoprazole.  Had seen Dr. Alvis Lemmings a mth ago for abdominal pain.  He was told to stop pantoprazole for 2 weeks to allow for H. pylori test.  Continue taking the famotidine she states is not helping with the acid reflux.  H. pylori test was negative.  He was not aware that he was supposed to restart the pantoprazole after the test.  He has had his gallbladder removed.   Patient Active Problem List   Diagnosis Date Noted   Medication management 05/25/2023   Osteomyelitis of right hip (HCC) 04/28/2023   History of infection due to extended spectrum beta lactamase (ESBL) producing bacteria 04/11/2023   Hyperlipidemia associated with type 2 diabetes mellitus (HCC) 12/01/2022   Dental caries 12/01/2022   Iron deficiency anemia due to chronic blood loss 11/14/2022   Anemia of chronic disease 11/14/2022   Medication monitoring encounter 04/21/2022   PICC (peripherally inserted central catheter) in place 03/16/2022   Normocytic anemia 03/03/2022   Wheelchair dependence 12/23/2020    History of cholecystectomy 11/18/2020   Essential hypertension    Neurogenic bladder 01/06/2012   Neurogenic bowel 01/06/2012   Paraplegia (HCC) 11/16/2011   Controlled type 2 diabetes mellitus (HCC) 05/26/2007   OBESITY, MODERATE 05/26/2007     Current Outpatient Medications on File Prior to Visit  Medication Sig Dispense Refill   atorvastatin (LIPITOR) 20 MG tablet Take 1 tablet (20 mg total) by mouth daily at 12 noon. 90 tablet 2   metFORMIN (GLUCOPHAGE) 1000 MG tablet Take 1 tablet (1,000 mg total) by mouth 2 (two) times daily with a meal. 180 tablet 3   pantoprazole (PROTONIX) 40 MG tablet Take 1 tablet (40 mg total) by mouth at bedtime. 90 tablet 0   sitaGLIPtin (JANUVIA) 100 MG tablet Take 1 tablet (100 mg total) by mouth daily. 90 tablet 3   ferrous sulfate (FEROSUL) 325 (65 FE) MG tablet Take 1 tablet (325 mg total) by mouth daily. (Patient not taking: Reported on 08/03/2023) 30 tablet 0   Simethicone 180 MG CAPS Take 1 capsule (180 mg total) by mouth 3 (three) times daily. (Patient not taking: Reported on 08/26/2023) 90 capsule 1   valsartan-hydrochlorothiazide (DIOVAN-HCT) 320-25 MG tablet Take 1 tablet by mouth daily. (Patient not taking: Reported on 08/26/2023) 90 tablet 3   Vitamin D, Ergocalciferol, (DRISDOL) 1.25 MG (50000 UNIT) CAPS capsule Take 1 capsule (50,000 Units total) by mouth every 7 (seven) days. Please get updated vit D level. (Patient not taking: Reported on 08/26/2023) 4 capsule  0   [DISCONTINUED] loratadine (CLARITIN) 10 MG tablet Take 1 tablet (10 mg total) by mouth daily. As needed for itchy throat/allergy symptoms (Patient not taking: Reported on 03/24/2019) 30 tablet 11   No current facility-administered medications on file prior to visit.    Allergies  Allergen Reactions   Macrobid [Nitrofurantoin] Itching and Rash    Social History   Socioeconomic History   Marital status: Married    Spouse name: Not on file   Number of children: Not on file    Years of education: Not on file   Highest education level: Not on file  Occupational History   Occupation: disabled  Tobacco Use   Smoking status: Former    Current packs/day: 0.00    Average packs/day: 1 pack/day for 5.0 years (5.0 ttl pk-yrs)    Types: Cigarettes    Start date: 10/12/2006    Quit date: 10/13/2011    Years since quitting: 11.8   Smokeless tobacco: Never   Tobacco comments:    03-24-19 per pt he stopped 1 mo ago   Vaping Use   Vaping status: Never Used  Substance and Sexual Activity   Alcohol use: Not Currently    Alcohol/week: 1.0 standard drink of alcohol    Types: 1 Cans of beer per week   Drug use: Never   Sexual activity: Not on file  Other Topics Concern   Not on file  Social History Narrative   Not on file   Social Determinants of Health   Financial Resource Strain: Low Risk  (08/26/2023)   Overall Financial Resource Strain (CARDIA)    Difficulty of Paying Living Expenses: Not hard at all  Food Insecurity: No Food Insecurity (08/26/2023)   Hunger Vital Sign    Worried About Running Out of Food in the Last Year: Never true    Ran Out of Food in the Last Year: Never true  Transportation Needs: No Transportation Needs (08/26/2023)   PRAPARE - Administrator, Civil Service (Medical): No    Lack of Transportation (Non-Medical): No  Physical Activity: Inactive (08/26/2023)   Exercise Vital Sign    Days of Exercise per Week: 0 days    Minutes of Exercise per Session: 0 min  Stress: No Stress Concern Present (08/26/2023)   [employer] of Occupational Health - Occupational Stress Questionnaire    Feeling of Stress : Only a little  Social Connections: Moderately Integrated (08/26/2023)   Social Connection and Isolation Panel [NHANES]    Frequency of Communication with Friends and Family: Three times a week    Frequency of Social Gatherings with Friends and Family: Twice a week    Attends Religious Services: Never    Database administrator  or Organizations: Yes    Attends Engineer, structural: More than 4 times per year    Marital Status: Married  Catering manager Violence: Not At Risk (08/26/2023)   Humiliation, Afraid, Rape, and Kick questionnaire    Fear of Current or Ex-Partner: No    Emotionally Abused: No    Physically Abused: No    Sexually Abused: No    Family History  Problem Relation Age of Onset   Healthy Mother    Diabetes Father    Diabetes Sister     Past Surgical History:  Procedure Laterality Date   APPLICATION OF WOUND VAC Right 05/01/2023   Procedure: APPLICATION OF WOUND VAC;  Surgeon: [name], MD;  Location: [facility] OR;  Service: Orthopedics;  Laterality: Right;   CHOLECYSTECTOMY N/A 05/01/2015   Procedure: LAPAROSCOPIC CHOLECYSTECTOMY WITH ATTEMPTED INTRAOPERATIVE CHOLANGIOGRAM;  Surgeon: Violeta Gelinas, MD;  Location: [facility] OR;  Service: General;  Laterality: N/A;   HIP DISARTICULATION Right 05/01/2023   Procedure: RIGHT HIP DISARTICULATION;  Surgeon: [name], MD;  Location: [facility] OR;  Service: Orthopedics;  Laterality: Right;   IR US GUIDE BX ASP/DRAIN  04/27/2023   SPINE SURGERY     TEE WITHOUT CARDIOVERSION N/A 03/26/2022   Procedure: TRANSESOPHAGEAL ECHOCARDIOGRAM (TEE);  Surgeon: Elease Hashimoto Deloris Ping, MD;  Location: [facility] ENDOSCOPY;  Service: Cardiovascular;  Laterality: N/A;   WOUND EXPLORATION Right 05/01/2023   Procedure: WOUND EXPLORATION OF HIP;  Surgeon: [name], MD;  Location: [facility] OR;  Service: Orthopedics;  Laterality: Right;    ROS: Review of Systems Negative except as stated above  PHYSICAL EXAM: BP (!) 149/76 (BP Location: Left Arm, Patient Position: Sitting, Cuff Size: Normal)   Pulse 85   Temp 98 F (36.7 C) (Oral)   Ht 5\' 4"  (1.626 m)   SpO2 100%   BMI 27.40 kg/m   Physical Exam   General appearance - alert, well appearing, older Hispanic male sitting in wheelchair and in no distress Mental status - normal mood, behavior, speech, dress, motor activity, and  thought processes Chest - clear to auscultation, no wheezes, rales or rhonchi, symmetric air entry Heart - normal rate, regular rhythm, normal S1, S2, no murmurs, rubs, clicks or gallops Abdomen -normal bowel sounds.  Soft.  Nontender.  No guarding or rebound.     Latest Ref Rng & Units 05/27/2023    9:38 AM 05/03/2023    4:00 AM 05/02/2023    5:40 AM  CMP  Glucose 70 - 99 mg/dL 81  95  161   BUN 8 - 27 mg/dL 19  14  12    Creatinine 0.76 - 1.27 mg/dL 0.96  0.45  4.09   Sodium 134 - 144 mmol/L 137  136  134   Potassium 3.5 - 5.2 mmol/L 4.8  3.7  3.4   Chloride 96 - 106 mmol/L 105  105  106   CO2 20 - 29 mmol/L 20  24  23    Calcium 8.6 - 10.2 mg/dL 9.2  8.1  8.0   Total Protein 6.0 - 8.5 g/dL 6.4     Total Bilirubin 0.0 - 1.2 mg/dL 0.3     Alkaline Phos 44 - 121 IU/L 141     AST 0 - 40 IU/L 17     ALT 0 - 44 IU/L 22      Lipid Panel     Component Value Date/Time   CHOL 122 05/27/2023 0938   TRIG 123 05/27/2023 0938   HDL 44 05/27/2023 0938   CHOLHDL 2.8 05/27/2023 0938   CHOLHDL 11.5 05/05/2015 0352   VLDL 35 05/05/2015 0352   LDLCALC 56 05/27/2023 0938    CBC    Component Value Date/Time   WBC 6.5 06/18/2023 1044   RBC 3.75 (L) 06/18/2023 1044   HGB 11.9 (L) 06/18/2023 1044   HGB 11.2 (L) 05/27/2023 0938   HCT 35.0 (L) 06/18/2023 1044   HCT 34.4 (L) 05/27/2023 0938   PLT 194 06/18/2023 1044   PLT 188 05/27/2023 0938   MCV 93.3 06/18/2023 1044   MCV 93 05/27/2023 0938   MCH 31.7 06/18/2023 1044   MCHC 34.0 06/18/2023 1044   RDW 14.2 06/18/2023 1044   RDW 13.7 05/27/2023 0938   LYMPHSABS 1.9 06/18/2023  1044   LYMPHSABS 1.6 05/27/2023 0938   MONOABS 0.6 06/18/2023 1044   EOSABS 0.1 06/18/2023 1044   EOSABS 0.3 05/27/2023 0938   BASOSABS 0.0 06/18/2023 1044   BASOSABS 0.1 05/27/2023 0938    ASSESSMENT AND PLAN: 1. Gastroesophageal reflux disease without esophagitis GERD precautions discussed.  Advised to avoid certain foods like spicy foods, tomato-based  foods, juices and excessive caffeine.  Advised to eat his last meal at least 2 to 3 hours before laying down at nights and to sleep with his head slightly elevated.   Restart pantoprazole. Stop famotidine  2. Right upper quadrant abdominal pain Seem to be related to his acid reflux.  See #1 above. - Hepatic Function Panel; Future    Patient was given the opportunity to ask questions.  Patient verbalized understanding of the plan and was able to repeat key elements of the plan.   This documentation was completed using Paediatric nurse.  Any transcriptional errors are unintentional.  Orders Placed This Encounter  Procedures   Hepatic Function Panel     Requested Prescriptions    No prescriptions requested or ordered in this encounter    No follow-ups on file.  Jonah Blue, MD, FACP

## 2023-08-27 ENCOUNTER — Ambulatory Visit: Payer: Self-pay | Attending: Family Medicine

## 2023-08-27 DIAGNOSIS — R1011 Right upper quadrant pain: Secondary | ICD-10-CM

## 2023-08-28 LAB — HEPATIC FUNCTION PANEL
ALT: 18 [IU]/L (ref 0–44)
AST: 16 [IU]/L (ref 0–40)
Albumin: 3.9 g/dL (ref 3.8–4.9)
Alkaline Phosphatase: 148 [IU]/L — ABNORMAL HIGH (ref 44–121)
Bilirubin Total: 0.3 mg/dL (ref 0.0–1.2)
Bilirubin, Direct: 0.11 mg/dL (ref 0.00–0.40)
Total Protein: 7.1 g/dL (ref 6.0–8.5)

## 2023-09-03 ENCOUNTER — Telehealth: Payer: Self-pay | Admitting: Family Medicine

## 2023-09-03 NOTE — Telephone Encounter (Signed)
Using Spanish interpreter [place] # [phone], pt. Given lab results, verbalizes understanding.

## 2023-09-08 NOTE — Telephone Encounter (Signed)
Message received from Marcelle Smiling, RN/Congregational Nurse regarding a donation of an [employer] hospital bed with an air mattress. I told her that I would contact our patient that needs the air mattress and is uninsured and see if he would be interested.   I called patient's wife with the assistance of John Meza, interpreting. John Meza explained what is being donated and there is no charge for the bed.  His wife, John Meza, was very interested and agreeable for me to share her phone number with John Meza to discuss this further.   I called John Meza and tried to conference call patient's wife, John Meza , in but John Meza did not answer.  Rhonda then said that she will need to obtain more information about disassembly, transport and re-assembly of the bed and she would call me back with more information and we can then call the patient's wife.

## 2023-09-08 NOTE — Telephone Encounter (Signed)
Approval received from [facility] for purchase of air mattress through [employer].

## 2023-09-14 ENCOUNTER — Ambulatory Visit: Payer: Self-pay | Admitting: Family Medicine

## 2023-09-14 ENCOUNTER — Ambulatory Visit: Payer: Self-pay | Admitting: Nurse Practitioner

## 2023-09-15 ENCOUNTER — Encounter: Payer: Self-pay | Admitting: Hematology

## 2023-09-15 ENCOUNTER — Inpatient Hospital Stay (HOSPITAL_BASED_OUTPATIENT_CLINIC_OR_DEPARTMENT_OTHER): Payer: Self-pay | Admitting: Hematology

## 2023-09-15 ENCOUNTER — Inpatient Hospital Stay: Payer: Self-pay | Attending: Hematology

## 2023-09-15 VITALS — BP 146/78 | HR 71 | Temp 97.9°F | Resp 18

## 2023-09-15 DIAGNOSIS — Z79899 Other long term (current) drug therapy: Secondary | ICD-10-CM | POA: Insufficient documentation

## 2023-09-15 DIAGNOSIS — G8220 Paraplegia, unspecified: Secondary | ICD-10-CM | POA: Insufficient documentation

## 2023-09-15 DIAGNOSIS — E119 Type 2 diabetes mellitus without complications: Secondary | ICD-10-CM | POA: Insufficient documentation

## 2023-09-15 DIAGNOSIS — D509 Iron deficiency anemia, unspecified: Secondary | ICD-10-CM

## 2023-09-15 DIAGNOSIS — D638 Anemia in other chronic diseases classified elsewhere: Secondary | ICD-10-CM | POA: Insufficient documentation

## 2023-09-15 DIAGNOSIS — D500 Iron deficiency anemia secondary to blood loss (chronic): Secondary | ICD-10-CM

## 2023-09-15 DIAGNOSIS — Z993 Dependence on wheelchair: Secondary | ICD-10-CM | POA: Insufficient documentation

## 2023-09-15 LAB — CBC WITH DIFFERENTIAL/PLATELET
Abs Immature Granulocytes: 0.01 10*3/uL (ref 0.00–0.07)
Basophils Absolute: 0 10*3/uL (ref 0.0–0.1)
Basophils Relative: 0 %
Eosinophils Absolute: 0.1 10*3/uL (ref 0.0–0.5)
Eosinophils Relative: 2 %
HCT: 35.1 % — ABNORMAL LOW (ref 39.0–52.0)
Hemoglobin: 12 g/dL — ABNORMAL LOW (ref 13.0–17.0)
Immature Granulocytes: 0 %
Lymphocytes Relative: 27 %
Lymphs Abs: 1.6 10*3/uL (ref 0.7–4.0)
MCH: 31.5 pg (ref 26.0–34.0)
MCHC: 34.2 g/dL (ref 30.0–36.0)
MCV: 92.1 fL (ref 80.0–100.0)
Monocytes Absolute: 0.5 10*3/uL (ref 0.1–1.0)
Monocytes Relative: 8 %
Neutro Abs: 3.7 10*3/uL (ref 1.7–7.7)
Neutrophils Relative %: 63 %
Platelets: 182 10*3/uL (ref 150–400)
RBC: 3.81 MIL/uL — ABNORMAL LOW (ref 4.22–5.81)
RDW: 13.1 % (ref 11.5–15.5)
WBC: 6 10*3/uL (ref 4.0–10.5)
nRBC: 0 % (ref 0.0–0.2)

## 2023-09-15 LAB — IRON AND IRON BINDING CAPACITY (CC-WL,HP ONLY)
Iron: 66 ug/dL (ref 45–182)
Saturation Ratios: 34 % (ref 17.9–39.5)
TIBC: 196 ug/dL — ABNORMAL LOW (ref 250–450)
UIBC: 130 ug/dL (ref 117–376)

## 2023-09-15 LAB — FERRITIN: Ferritin: 131 ng/mL (ref 24–336)

## 2023-09-15 NOTE — Assessment & Plan Note (Signed)
-  His iron study supports anemia of chronic disease, most likely related to his infection in June 2023, and long-term diabetes. -Anemia has improved with oral iron and medical management of his chronic disease. 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  However if his anemia does not improve further, I would add an additional lab work for his anemia workup.

## 2023-09-15 NOTE — Assessment & Plan Note (Signed)
-  His anemia is most likely related to his chronic disease, especially diabetes and complications. -He does have low serum iron, low TIBC, ferritin has been normal in most tests, he probably has component of iron deficiency also, but his iron study is most consistent with anemia of chronic disease. -I will continue his oral iron, we discussed take his iron pill in between Pepcid, and avoid PPI.  I encouraged him to take vitamin C along with iron pill. -His anemia has improved significantly since last year.  Continue to monitor his lab
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
## 2023-09-17 ENCOUNTER — Other Ambulatory Visit: Payer: Self-pay

## 2023-09-27 ENCOUNTER — Other Ambulatory Visit: Payer: Self-pay

## 2023-09-28 ENCOUNTER — Ambulatory Visit: Payer: Self-pay | Admitting: Family Medicine

## 2023-09-29 ENCOUNTER — Other Ambulatory Visit: Payer: Self-pay | Admitting: Critical Care Medicine

## 2023-09-29 NOTE — Telephone Encounter (Signed)
Regarding the bed delivery on 09/27/2023:   Per Wynonia Hazard, Patient Care Support Services, The movers said all went well. Their daughter translated for them.  I called patient's wife,John Meza, to check on the patient and the bed, message left with call back requested.  Call placed with assistance of Spanish interpreter: [phone]/[employer]

## 2023-09-30 NOTE — Telephone Encounter (Signed)
Pt wife is returning nurse Jane's call. Stated nurse Erskine Squibb left a voicemail asking Inflatable Mattress if everything was okay. Pt wife stated she has a question: the mattress is connected to a machine, and if she unplugs the machine from the mattress, the mattress deflates. She is asking if the mattress needs to stay connected to the machine at all times.  Please advise.

## 2023-09-30 NOTE — Telephone Encounter (Signed)
Call was returned to patient's wife, [name] by Leana Roe, Patient Access Advocate. Fausto Skillern is Spanish speaking.  [name] reported no problems with the bed.  She just wanted to report that when it is unplugged, the air mattress deflates but when she plugs it back in the mattress re-inflates.  She said this was just different from a bed they had in the past.   Overall, [name] said they are very happy with the bed.

## 2023-10-28 ENCOUNTER — Other Ambulatory Visit: Payer: Self-pay

## 2023-11-02 ENCOUNTER — Encounter: Payer: Self-pay | Admitting: Hematology

## 2023-11-02 ENCOUNTER — Other Ambulatory Visit: Payer: Self-pay

## 2023-11-02 ENCOUNTER — Other Ambulatory Visit: Payer: Self-pay | Admitting: Critical Care Medicine

## 2023-11-02 MED ORDER — PANTOPRAZOLE SODIUM 40 MG PO TBEC
40.0000 mg | DELAYED_RELEASE_TABLET | Freq: Every day | ORAL | 0 refills | Status: DC
  Filled 2023-11-02: qty 90, 90d supply, fill #0

## 2023-11-02 MED ORDER — ATORVASTATIN CALCIUM 20 MG PO TABS
20.0000 mg | ORAL_TABLET | Freq: Every day | ORAL | 0 refills | Status: DC
  Filled 2023-11-02: qty 90, 90d supply, fill #0

## 2023-11-03 ENCOUNTER — Other Ambulatory Visit: Payer: Self-pay

## 2023-11-29 ENCOUNTER — Encounter: Payer: Self-pay | Admitting: Hematology

## 2023-12-01 ENCOUNTER — Encounter: Payer: Self-pay | Admitting: Hematology

## 2023-12-01 ENCOUNTER — Encounter: Payer: Self-pay | Admitting: Family Medicine

## 2023-12-01 ENCOUNTER — Other Ambulatory Visit: Payer: Self-pay

## 2023-12-01 ENCOUNTER — Ambulatory Visit: Payer: Self-pay | Attending: Family Medicine | Admitting: Family Medicine

## 2023-12-01 VITALS — BP 136/76 | HR 76 | Ht 64.0 in

## 2023-12-01 DIAGNOSIS — Z7984 Long term (current) use of oral hypoglycemic drugs: Secondary | ICD-10-CM

## 2023-12-01 DIAGNOSIS — K219 Gastro-esophageal reflux disease without esophagitis: Secondary | ICD-10-CM | POA: Insufficient documentation

## 2023-12-01 DIAGNOSIS — E559 Vitamin D deficiency, unspecified: Secondary | ICD-10-CM

## 2023-12-01 DIAGNOSIS — N390 Urinary tract infection, site not specified: Secondary | ICD-10-CM

## 2023-12-01 DIAGNOSIS — N319 Neuromuscular dysfunction of bladder, unspecified: Secondary | ICD-10-CM

## 2023-12-01 DIAGNOSIS — D500 Iron deficiency anemia secondary to blood loss (chronic): Secondary | ICD-10-CM

## 2023-12-01 DIAGNOSIS — E1165 Type 2 diabetes mellitus with hyperglycemia: Secondary | ICD-10-CM

## 2023-12-01 LAB — POCT URINALYSIS DIP (CLINITEK)
Bilirubin, UA: NEGATIVE
Glucose, UA: NEGATIVE mg/dL
Ketones, POC UA: NEGATIVE mg/dL
Nitrite, UA: NEGATIVE
POC PROTEIN,UA: >=300 — AB
Spec Grav, UA: 1.015 (ref 1.010–1.025)
Urobilinogen, UA: 0.2 U/dL
pH, UA: 6 (ref 5.0–8.0)

## 2023-12-01 LAB — POCT GLYCOSYLATED HEMOGLOBIN (HGB A1C): HbA1c, POC (controlled diabetic range): 7.4 % — AB (ref 0.0–7.0)

## 2023-12-01 MED ORDER — VALSARTAN-HYDROCHLOROTHIAZIDE 320-25 MG PO TABS
1.0000 | ORAL_TABLET | Freq: Every day | ORAL | 1 refills | Status: DC
  Filled 2023-12-01: qty 90, 90d supply, fill #0
  Filled 2024-02-28: qty 90, 90d supply, fill #1

## 2023-12-01 MED ORDER — JANUVIA 100 MG PO TABS
100.0000 mg | ORAL_TABLET | Freq: Every day | ORAL | 1 refills | Status: DC
  Filled 2023-12-01: qty 90, 90d supply, fill #0

## 2023-12-01 MED ORDER — ATORVASTATIN CALCIUM 20 MG PO TABS
20.0000 mg | ORAL_TABLET | Freq: Every day | ORAL | 1 refills | Status: DC
  Filled 2023-12-01 – 2024-01-31 (×2): qty 90, 90d supply, fill #0

## 2023-12-01 MED ORDER — METFORMIN HCL 1000 MG PO TABS
1000.0000 mg | ORAL_TABLET | Freq: Two times a day (BID) | ORAL | 1 refills | Status: DC
  Filled 2023-12-01 – 2024-01-31 (×2): qty 180, 90d supply, fill #0

## 2023-12-01 MED ORDER — ONDANSETRON HCL 4 MG PO TABS
4.0000 mg | ORAL_TABLET | Freq: Three times a day (TID) | ORAL | 1 refills | Status: DC | PRN
  Filled 2023-12-01: qty 30, 10d supply, fill #0

## 2023-12-01 MED ORDER — PANTOPRAZOLE SODIUM 40 MG PO TBEC
40.0000 mg | DELAYED_RELEASE_TABLET | Freq: Every day | ORAL | 1 refills | Status: DC
  Filled 2023-12-01 – 2024-01-31 (×2): qty 90, 90d supply, fill #0

## 2023-12-01 MED ORDER — CEPHALEXIN 500 MG PO CAPS
500.0000 mg | ORAL_CAPSULE | Freq: Two times a day (BID) | ORAL | 0 refills | Status: DC
  Filled 2023-12-01: qty 14, 7d supply, fill #0

## 2023-12-01 MED ORDER — FERROUS SULFATE 325 (65 FE) MG PO TABS
325.0000 mg | ORAL_TABLET | Freq: Every day | ORAL | 0 refills | Status: DC
  Filled 2023-12-01: qty 30, 30d supply, fill #0

## 2023-12-01 NOTE — Patient Instructions (Signed)
VISIT SUMMARY:  During today's visit, we discussed your concerns about a possible urinary tract infection, your ongoing management of high blood pressure and diabetes, occasional stomach upset, and previous prescriptions for iron and vitamin D supplements. We reviewed your current medications and made plans for further testing and treatment adjustments as needed.  YOUR PLAN:  -URINARY TRACT INFECTION: A urinary tract infection (UTI) is an infection in any part of your urinary system. Although you have no symptoms, your urinalysis showed an infection. We will collect a urine culture and start you on antibiotics, adjusting the treatment based on the culture results.  -HYPERTENSION: Hypertension, or high blood pressure, is when the force of your blood against your artery walls is too high. Your blood pressure is well controlled with your current medication, Valsartan-Hydrochlorothiazide, so you should continue taking it as prescribed.  -TYPE 2 DIABETES MELLITUS: Type 2 diabetes is a condition that affects the way your body processes blood sugar. Your A1c level has increased slightly from 7.0 to 7.4. We recommend dietary changes to reduce starch intake and will recheck your A1c at your next visit to see if medication adjustments are needed.  -GASTROESOPHAGEAL REFLUX DISEASE: Gastroesophageal reflux disease (GERD) is a digestive disorder where stomach acid irritates the food pipe lining. You reported heartburn and nausea, possibly related to certain foods. We suggest avoiding trigger foods and have prescribed Zofran for nausea.  -IRON DEFICIENCY ANEMIA: Iron deficiency anemia is a condition where you lack enough iron to produce healthy red blood cells. We will check your current iron levels and refill your iron supplement prescription if needed.  -VITAMIN D DEFICIENCY: Vitamin D deficiency occurs when you don't have enough vitamin D in your body. We will check your current vitamin D levels and refill your  supplement prescription if needed.  -GENERAL HEALTH MAINTENANCE: We will continue your current medications, including Pantoprazole for acid reflux, Metformin and Januvia for diabetes, and Valsartan-Hydrochlorothiazide for hypertension. We will also check your blood work today, including potassium and vitamin D levels, and plan to refill your iron and vitamin D supplements based on the lab results.  INSTRUCTIONS:  Please follow up with [place] after your urine culture results are available so we can adjust your antibiotic treatment if necessary. We will also recheck your A1c at your next visit to monitor your diabetes management. Make sure to get your blood work done today to check your potassium and vitamin D levels.

## 2023-12-01 NOTE — Progress Notes (Signed)
Subjective:  Patient ID: John Meza, male    DOB: 1960/10/22  Age: 63 y.o. MRN: [id number]  CC: Medical Management of Chronic Issues (Check for UTI)   HPI John Meza is a 63 y.o. 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  Interval History: Discussed the use of AI scribe software for clinical note transcription with the patient, who gave verbal consent to proceed.  He presents with a concern for a urinary tract infection. He reports no specific symptoms but expresses a desire to check his urine periodically due to past experiences of sudden severe infections leading to hospitalization. He also discusses his ongoing management of high blood pressure and diabetes, confirming he is taking his prescribed medications including valsartan/hctz, metformin, and Januvia.  He also mentions a previous prescription for iron pills, which he is not currently taking but expresses a willingness to restart if necessary.  He was seen by hematology for iron deficiency anemia with recommendation to continue ferrous sulfate as he did have anemia of chronic disease.  The patient also reports occasional stomach upset and diarrhea, which he suspects may be related to his diet, particularly his meat intake.  He is on pantoprazole which she states has been beneficial compared to Pepcid which she did in the past which was ineffective.  He also mentions a previous prescription for a vitamin D supplement, which he is not currently taking.        Past Medical History:  Diagnosis Date   Cellulitis and abscess of buttock 10/2016   Diabetes mellitus    ESBL (extended spectrum beta-lactamase) producing bacteria infection 03/16/2022   Hyperkalemia 03/16/2022   Hypertension    Myositis 04/21/2022   Paraplegia (HCC) 2013   fell from ladder   Pelvic  abscess in male [facility]) 05/01/2023   Pressure ulcer, buttock, right, unstageable (HCC) 12/30/2020   Pyelonephritis due to Escherichia coli 03/16/2022   Sacral decubitus ulcer, stage IV (HCC) 11/03/2021   SCI (spinal cord injury)    Sepsis due to Escherichia coli (E. coli) (HCC) 03/01/2022   Septic hip (HCC) 04/28/2023   Spine fracture 11/16/2011   T 11- T9-L1    Past Surgical History:  Procedure Laterality Date   APPLICATION OF WOUND VAC Right 05/01/2023   Procedure: APPLICATION OF WOUND VAC;  Surgeon: [name], MD;  Location: [facility] OR;  Service: Orthopedics;  Laterality: Right;   CHOLECYSTECTOMY N/A 05/01/2015   Procedure: LAPAROSCOPIC CHOLECYSTECTOMY WITH ATTEMPTED INTRAOPERATIVE CHOLANGIOGRAM;  Surgeon: Violeta Gelinas, MD;  Location: [facility] OR;  Service: General;  Laterality: N/A;   HIP DISARTICULATION Right 05/01/2023   Procedure: RIGHT HIP DISARTICULATION;  Surgeon: [name], MD;  Location: [facility] OR;  Service: Orthopedics;  Laterality: Right;   IR US GUIDE BX ASP/DRAIN  04/27/2023   SPINE SURGERY     John WITHOUT CARDIOVERSION N/A 03/26/2022   Procedure: TRANSESOPHAGEAL ECHOCARDIOGRAM (John);  Surgeon: Elease Hashimoto Deloris Ping, MD;  Location: [facility] ENDOSCOPY;  Service: Cardiovascular;  Laterality: N/A;   WOUND EXPLORATION Right 05/01/2023   Procedure: WOUND EXPLORATION OF HIP;  Surgeon: [name], MD;  Location[facility] OR;  Service: Orthopedics;  Laterality: Right;    Family History  Problem Relation Age of Onset   Healthy Mother    Diabetes Father    Diabetes Sister     Social History   Socioeconomic History   Marital  status: Married    Spouse name: Not on file   Number of children: Not on file   Years of education: Not on file   Highest education level: Not on file  Occupational History   Occupation: disabled  Tobacco Use   Smoking status: Former    Current packs/day: 0.00    Average packs/day: 1 pack/day for 5.0 years (5.0 ttl pk-yrs)    Types: Cigarettes    Start date:  10/12/2006    Quit date: 10/13/2011    Years since quitting: 12.1   Smokeless tobacco: Never   Tobacco comments:    03-24-19 per pt he stopped 1 mo ago   Vaping Use   Vaping status: Never Used  Substance and Sexual Activity   Alcohol use: Not Currently    Alcohol/week: 1.0 standard drink of alcohol    Types: 1 Cans of beer per week   Drug use: Never   Sexual activity: Not on file  Other Topics Concern   Not on file  Social History Narrative   Not on file   Social Drivers of Health   Financial Resource Strain: Low Risk  (08/26/2023)   Overall Financial Resource Strain (CARDIA)    Difficulty of Paying Living Expenses: Not hard at all  Food Insecurity: No Food Insecurity (08/26/2023)   Hunger Vital Sign    Worried About Running Out of Food in the Last Year: Never true    Ran Out of Food in the Last Year: Never true  Transportation Needs: No Transportation Needs (08/26/2023)   PRAPARE - Administrator, Civil Service (Medical): No    Lack of Transportation (Non-Medical): No  Physical Activity: Inactive (08/26/2023)   Exercise Vital Sign    Days of Exercise per Week: 0 days    Minutes of Exercise per Session: 0 min  Stress: No Stress Concern Present (08/26/2023)   [employer] of Occupational Health - Occupational Stress Questionnaire    Feeling of Stress : Only a little  Social Connections: Moderately Integrated (08/26/2023)   Social Connection and Isolation Panel [NHANES]    Frequency of Communication with Friends and Family: Three times a week    Frequency of Social Gatherings with Friends and Family: Twice a week    Attends Religious Services: Never    Database administrator or Organizations: Yes    Attends Engineer, structural: More than 4 times per year    Marital Status: Married    Allergies  Allergen Reactions   Macrobid [Nitrofurantoin] Itching and Rash    Outpatient Medications Prior to Visit  Medication Sig Dispense Refill    atorvastatin (LIPITOR) 20 MG tablet Take 1 tablet (20 mg total) by mouth daily at 12 noon. 90 tablet 0   ferrous sulfate (FEROSUL) 325 (65 FE) MG tablet Take 1 tablet (325 mg total) by mouth daily. (Patient not taking: Reported on 08/03/2023) 30 tablet 0   JANUVIA 100 MG tablet TAKE ONE TABLET BY MOUTH DAILY 90 tablet 0   metFORMIN (GLUCOPHAGE) 1000 MG tablet Take 1 tablet (1,000 mg total) by mouth 2 (two) times daily with a meal. 180 tablet 3   pantoprazole (PROTONIX) 40 MG tablet Take 1 tablet (40 mg total) by mouth at bedtime. 90 tablet 0   Simethicone 180 MG CAPS Take 1 capsule (180 mg total) by mouth 3 (three) times daily. (Patient not taking: Reported on 08/26/2023) 90 capsule 1   valsartan-hydrochlorothiazide (DIOVAN-HCT) 320-25 MG tablet Take 1 tablet by mouth daily. (  Patient not taking: Reported on 08/26/2023) 90 tablet 3   Vitamin D, Ergocalciferol, (DRISDOL) 1.25 MG (50000 UNIT) CAPS capsule Take 1 capsule (50,000 Units total) by mouth every 7 (seven) days. Please get updated vit D level. (Patient not taking: Reported on 08/26/2023) 4 capsule 0   No facility-administered medications prior to visit.     ROS Review of Systems  Constitutional:  Negative for activity change and appetite change.  HENT:  Negative for sinus pressure and sore throat.   Respiratory:  Negative for chest tightness, shortness of breath and wheezing.   Cardiovascular:  Negative for chest pain and palpitations.  Gastrointestinal:  Positive for diarrhea. Negative for abdominal distention, abdominal pain and constipation.  Genitourinary: Negative.   Musculoskeletal: Negative.   Psychiatric/Behavioral:  Negative for behavioral problems and dysphoric mood.     Objective:  BP 136/76   Pulse 76   Ht 5\' 4"  (1.626 m)   SpO2 100%   BMI 27.40 kg/m      12/01/2023   10:32 AM 09/15/2023   11:11 AM 08/26/2023    5:46 PM  BP/Weight  Systolic BP 136 146 149  Diastolic BP 76 78 76      Physical  Exam Constitutional:      Appearance: He is well-developed.  Cardiovascular:     Rate and Rhythm: Normal rate.     Heart sounds: Normal heart sounds. No murmur heard. Pulmonary:     Effort: Pulmonary effort is normal.     Breath sounds: Normal breath sounds. No wheezing or rales.  Chest:     Chest wall: No tenderness.  Abdominal:     General: Bowel sounds are normal. There is no distension.     Palpations: Abdomen is soft. There is no mass.     Tenderness: There is no abdominal tenderness.  Musculoskeletal:        General: Normal range of motion.     Left lower leg: No edema.  Neurological:     Mental Status: He is alert and oriented to person, place, and time.  Psychiatric:        Mood and Affect: Mood normal.        Latest Ref Rng & Units 08/27/2023   11:02 AM 05/27/2023    9:38 AM 05/03/2023    4:00 AM  CMP  Glucose 70 - 99 mg/dL  81  95   BUN 8 - 27 mg/dL  19  14   Creatinine 3.66 - 1.27 mg/dL  4.40  3.47   Sodium 425 - 144 mmol/L  137  136   Potassium 3.5 - 5.2 mmol/L  4.8  3.7   Chloride 96 - 106 mmol/L  105  105   CO2 20 - 29 mmol/L  20  24   Calcium 8.6 - 10.2 mg/dL  9.2  8.1   Total Protein 6.0 - 8.5 g/dL 7.1  6.4    Total Bilirubin 0.0 - 1.2 mg/dL 0.3  0.3    Alkaline Phos 44 - 121 IU/L 148  141    AST 0 - 40 IU/L 16  17    ALT 0 - 44 IU/L 18  22      Lipid Panel     Component Value Date/Time   CHOL 122 05/27/2023 0938   TRIG 123 05/27/2023 0938   HDL 44 05/27/2023 0938   CHOLHDL 2.8 05/27/2023 0938   CHOLHDL 11.5 05/05/2015 0352   VLDL 35 05/05/2015 0352   LDLCALC 56 05/27/2023 9563  CBC    Component Value Date/Time   WBC 6.0 09/15/2023 1016   RBC 3.81 (L) 09/15/2023 1016   HGB 12.0 (L) 09/15/2023 1016   HGB 11.2 (L) 05/27/2023 0938   HCT 35.1 (L) 09/15/2023 1016   HCT 34.4 (L) 05/27/2023 0938   PLT 182 09/15/2023 1016   PLT 188 05/27/2023 0938   MCV 92.1 09/15/2023 1016   MCV 93 05/27/2023 0938   MCH 31.5 09/15/2023 1016   MCHC 34.2  09/15/2023 1016   RDW 13.1 09/15/2023 1016   RDW 13.7 05/27/2023 0938   LYMPHSABS 1.6 09/15/2023 1016   LYMPHSABS 1.6 05/27/2023 0938   MONOABS 0.5 09/15/2023 1016   EOSABS 0.1 09/15/2023 1016   EOSABS 0.3 05/27/2023 0938   BASOSABS 0.0 09/15/2023 1016   BASOSABS 0.1 05/27/2023 1610    Lab Results  Component Value Date   HGBA1C 7.4 (A) 12/01/2023    Assessment & Plan:      Urinary Tract Infection Asymptomatic, but has a history of severe recurrent UTIs in the setting of neurogenic bladder. Urinalysis positive for infection. -Collect urine culture and start empiric antibiotic therapy with Keflex. Adjust therapy based on culture and sensitivity results.  Hypertension Well controlled on Valsartan-Hydrochlorothiazide. -Continue current medication. -Counseled on blood pressure goal of less than 130/80, low-sodium, DASH diet, medication compliance, 150 minutes of moderate intensity exercise per week. Discussed medication compliance, adverse effects.   Type 2 Diabetes Mellitus A1c increased from 7.0 to 7.4. -Advise on dietary modifications to reduce starch intake. -Check A1c at next visit and consider medication adjustment if it continues to rise.  Gastroesophageal Reflux Disease Reports heartburn and nausea, possibly related to certain foods. -Advise on dietary modifications to avoid trigger foods. -He is currently on a PPI but I reviewed hematology notes which indicate they would rather he be on Pepcid instead but patient declines Pepcid which she states was ineffective in the past -Prescribe Zofran for nausea.  Iron Deficiency Anemia Previously prescribed iron supplements by a hematologist. -Refill prescription for ferrous sulfate  Vitamin D Deficiency Previously prescribed Vitamin D supplements. -Check current Vitamin D levels and refill prescription if levels are low.   General Health Maintenance -Continue current medications including Pantoprazole for acid reflux,  Metformin and Januvia for diabetes, and Valsartan-Hydrochlorothiazide for hypertension. -Check blood work today including potassium and Vitamin D levels. -Plan to refill iron and Vitamin D supplements based on lab results.          Meds ordered this encounter  Medications   atorvastatin (LIPITOR) 20 MG tablet    Sig: Take 1 tablet (20 mg total) by mouth daily at 12 noon.    Dispense:  90 tablet    Refill:  1   JANUVIA 100 MG tablet    Sig: Take 1 tablet (100 mg total) by mouth daily.    Dispense:  90 tablet    Refill:  1   metFORMIN (GLUCOPHAGE) 1000 MG tablet    Sig: Take 1 tablet (1,000 mg total) by mouth 2 (two) times daily with a meal.    Dispense:  180 tablet    Refill:  1   valsartan-hydrochlorothiazide (DIOVAN-HCT) 320-25 MG tablet    Sig: Take 1 tablet by mouth daily.    Dispense:  90 tablet    Refill:  1   ferrous sulfate (FEROSUL) 325 (65 FE) MG tablet    Sig: Take 1 tablet (325 mg total) by mouth daily.    Dispense:  30 tablet  Refill:  0   pantoprazole (PROTONIX) 40 MG tablet    Sig: Take 1 tablet (40 mg total) by mouth at bedtime.    Dispense:  90 tablet    Refill:  1   cephALEXin (KEFLEX) 500 MG capsule    Sig: Take 1 capsule (500 mg total) by mouth 2 (two) times daily.    Dispense:  14 capsule    Refill:  0   ondansetron (ZOFRAN) 4 MG tablet    Sig: Take 1 tablet (4 mg total) by mouth every 8 (eight) hours as needed for nausea or vomiting.    Dispense:  30 tablet    Refill:  1    Follow-up: Return in about 3 months (around 02/28/2024) for Chronic medical conditions.       Hoy Register, MD, FAAFP. [facility] [place], [place] [phone]   12/01/2023, 11:25 AM

## 2023-12-02 LAB — CMP14+EGFR
ALT: 12 [IU]/L (ref 0–44)
AST: 11 [IU]/L (ref 0–40)
Albumin: 3.8 g/dL (ref 3.8–4.9)
Alkaline Phosphatase: 118 [IU]/L (ref 44–121)
BUN/Creatinine Ratio: 23 (ref 10–24)
BUN: 22 mg/dL (ref 8–27)
Bilirubin Total: 0.2 mg/dL (ref 0.0–1.2)
CO2: 21 mmol/L (ref 20–29)
Calcium: 9.1 mg/dL (ref 8.6–10.2)
Chloride: 103 mmol/L (ref 96–106)
Creatinine, Ser: 0.95 mg/dL (ref 0.76–1.27)
Globulin, Total: 2.9 g/dL (ref 1.5–4.5)
Glucose: 120 mg/dL — ABNORMAL HIGH (ref 70–99)
Potassium: 4.9 mmol/L (ref 3.5–5.2)
Sodium: 138 mmol/L (ref 134–144)
Total Protein: 6.7 g/dL (ref 6.0–8.5)
eGFR: 92 mL/min/{1.73_m2} (ref >59)

## 2023-12-02 LAB — VITAMIN D 25 HYDROXY (VIT D DEFICIENCY, FRACTURES): Vit D, 25-Hydroxy: 16.6 ng/mL — ABNORMAL LOW (ref 30.0–100.0)

## 2023-12-03 ENCOUNTER — Other Ambulatory Visit: Payer: Self-pay

## 2023-12-03 ENCOUNTER — Encounter: Payer: Self-pay | Admitting: Hematology

## 2023-12-03 ENCOUNTER — Other Ambulatory Visit: Payer: Self-pay | Admitting: Family Medicine

## 2023-12-03 MED ORDER — ERGOCALCIFEROL 1.25 MG (50000 UT) PO CAPS
50000.0000 [IU] | ORAL_CAPSULE | ORAL | 1 refills | Status: DC
  Filled 2023-12-03: qty 12, 84d supply, fill #0

## 2023-12-07 LAB — URINE CULTURE

## 2023-12-13 ENCOUNTER — Other Ambulatory Visit: Payer: Self-pay

## 2023-12-13 DIAGNOSIS — D509 Iron deficiency anemia, unspecified: Secondary | ICD-10-CM

## 2023-12-13 DIAGNOSIS — D638 Anemia in other chronic diseases classified elsewhere: Secondary | ICD-10-CM

## 2023-12-13 DIAGNOSIS — D500 Iron deficiency anemia secondary to blood loss (chronic): Secondary | ICD-10-CM

## 2023-12-13 DIAGNOSIS — D649 Anemia, unspecified: Secondary | ICD-10-CM

## 2023-12-14 ENCOUNTER — Inpatient Hospital Stay: Payer: Self-pay | Attending: Hematology

## 2023-12-14 DIAGNOSIS — Z79899 Other long term (current) drug therapy: Secondary | ICD-10-CM | POA: Insufficient documentation

## 2023-12-14 DIAGNOSIS — Z993 Dependence on wheelchair: Secondary | ICD-10-CM | POA: Insufficient documentation

## 2023-12-14 DIAGNOSIS — D500 Iron deficiency anemia secondary to blood loss (chronic): Secondary | ICD-10-CM | POA: Insufficient documentation

## 2023-12-14 DIAGNOSIS — E119 Type 2 diabetes mellitus without complications: Secondary | ICD-10-CM | POA: Insufficient documentation

## 2023-12-14 DIAGNOSIS — D649 Anemia, unspecified: Secondary | ICD-10-CM

## 2023-12-14 DIAGNOSIS — D509 Iron deficiency anemia, unspecified: Secondary | ICD-10-CM

## 2023-12-14 DIAGNOSIS — G8220 Paraplegia, unspecified: Secondary | ICD-10-CM | POA: Insufficient documentation

## 2023-12-14 DIAGNOSIS — D638 Anemia in other chronic diseases classified elsewhere: Secondary | ICD-10-CM | POA: Insufficient documentation

## 2023-12-14 LAB — CBC WITH DIFFERENTIAL (CANCER CENTER ONLY)
Abs Immature Granulocytes: 0.03 10*3/uL (ref 0.00–0.07)
Basophils Absolute: 0 10*3/uL (ref 0.0–0.1)
Basophils Relative: 1 %
Eosinophils Absolute: 0.1 10*3/uL (ref 0.0–0.5)
Eosinophils Relative: 2 %
HCT: 34.6 % — ABNORMAL LOW (ref 39.0–52.0)
Hemoglobin: 11.8 g/dL — ABNORMAL LOW (ref 13.0–17.0)
Immature Granulocytes: 0 %
Lymphocytes Relative: 27 %
Lymphs Abs: 2 10*3/uL (ref 0.7–4.0)
MCH: 30.8 pg (ref 26.0–34.0)
MCHC: 34.1 g/dL (ref 30.0–36.0)
MCV: 90.3 fL (ref 80.0–100.0)
Monocytes Absolute: 0.5 10*3/uL (ref 0.1–1.0)
Monocytes Relative: 7 %
Neutro Abs: 4.8 10*3/uL (ref 1.7–7.7)
Neutrophils Relative %: 63 %
Platelet Count: 163 10*3/uL (ref 150–400)
RBC: 3.83 MIL/uL — ABNORMAL LOW (ref 4.22–5.81)
RDW: 12.9 % (ref 11.5–15.5)
WBC Count: 7.5 10*3/uL (ref 4.0–10.5)
nRBC: 0 % (ref 0.0–0.2)

## 2023-12-14 LAB — IRON AND IRON BINDING CAPACITY (CC-WL,HP ONLY)
Iron: 64 ug/dL (ref 45–182)
Saturation Ratios: 33 % (ref 17.9–39.5)
TIBC: 196 ug/dL — ABNORMAL LOW (ref 250–450)
UIBC: 132 ug/dL (ref 117–376)

## 2023-12-14 LAB — FERRITIN: Ferritin: 131 ng/mL (ref 24–336)

## 2023-12-20 ENCOUNTER — Telehealth: Payer: Self-pay

## 2023-12-20 NOTE — Telephone Encounter (Signed)
 Reached out to the patient via spanish interpreter [phone], per Dr. Mosetta Putt. Let wife know his lab results, overall stable, iron level good, no concerns. Wife verbalized understanding. Wife inquired about patient's diabetes. I let wife know that we did not test the patient for diabetes. Wife verbalized understanding.  No further questions. Let wife know to contact facility if they have further questions or concerns.

## 2023-12-22 ENCOUNTER — Encounter: Payer: Self-pay | Admitting: Hematology

## 2023-12-23 ENCOUNTER — Other Ambulatory Visit: Payer: Self-pay

## 2023-12-23 ENCOUNTER — Other Ambulatory Visit: Payer: Self-pay | Admitting: Family Medicine

## 2023-12-24 ENCOUNTER — Other Ambulatory Visit: Payer: Self-pay

## 2023-12-24 NOTE — Telephone Encounter (Signed)
 Unable to refill per protocol, Rx expired. Discontinued 03/31/21.  Requested Prescriptions  Pending Prescriptions Disp Refills   promethazine-dextromethorphan (PROMETHAZINE-DM) 6.25-15 MG/5ML syrup 118 mL 0    Sig: TAKE 5 MLS BY MOUTH 4 (FOUR) TIMES DAILY AS NEEDED FOR COUGH.     Ear, Nose, and Throat:  Antitussives/Expectorants Passed - 12/24/2023  9:42 AM      Passed - Valid encounter within last 12 months    Recent Outpatient Visits           3 weeks ago Controlled type 2 diabetes mellitus with hyperglycemia, without long-term current use of insulin (HCC)   [facility] [facility] [place]. [facility] Hoy Register, MD   4 months ago Gastroesophageal reflux disease without esophagitis   [place] [facility] [place] - A Dept Of [place]. [facility] Marcine Matar, MD   4 months ago Abdominal pain, unspecified abdominal location   [facility] - A Dept Of [place]. [facility] Hoy Register, MD   7 months ago Abscess of right hip   [place] [facility] [place] - A Dept Of [place]. [facility] Storm Frisk, MD   11 months ago Controlled type 2 diabetes mellitus with hyperglycemia, without long-term current use of insulin [facility])   [place] [facility] [place] - A Dept Of [place]. [facility] Storm Frisk, MD       Future Appointments             In 2 months Hoy Register, MD [facility] - A Dept Of [place]a [place]. [facility]

## 2023-12-31 ENCOUNTER — Other Ambulatory Visit: Payer: Self-pay

## 2023-12-31 ENCOUNTER — Telehealth: Payer: Self-pay

## 2023-12-31 NOTE — Telephone Encounter (Signed)
 Received notification from [facility] regarding approval for JANUVIA. Patient assistance approved from 12/22/2023 to 12/21/2024.  Medication will ship to [address], [place], [address]  Pt ID: RX# [id number]  Company phone: [phone]

## 2024-01-31 ENCOUNTER — Other Ambulatory Visit: Payer: Self-pay

## 2024-01-31 ENCOUNTER — Encounter: Payer: Self-pay | Admitting: Hematology

## 2024-02-01 ENCOUNTER — Other Ambulatory Visit: Payer: Self-pay

## 2024-02-28 ENCOUNTER — Other Ambulatory Visit: Payer: Self-pay

## 2024-02-28 ENCOUNTER — Encounter: Payer: Self-pay | Admitting: Hematology

## 2024-03-02 ENCOUNTER — Encounter: Payer: Self-pay | Admitting: Hematology

## 2024-03-02 ENCOUNTER — Ambulatory Visit: Payer: Self-pay | Attending: Family Medicine | Admitting: Family Medicine

## 2024-03-02 ENCOUNTER — Other Ambulatory Visit: Payer: Self-pay

## 2024-03-02 ENCOUNTER — Encounter: Payer: Self-pay | Admitting: Family Medicine

## 2024-03-02 VITALS — BP 147/79 | HR 68 | Ht 64.0 in

## 2024-03-02 DIAGNOSIS — K219 Gastro-esophageal reflux disease without esophagitis: Secondary | ICD-10-CM

## 2024-03-02 DIAGNOSIS — R399 Unspecified symptoms and signs involving the genitourinary system: Secondary | ICD-10-CM

## 2024-03-02 DIAGNOSIS — N319 Neuromuscular dysfunction of bladder, unspecified: Secondary | ICD-10-CM

## 2024-03-02 DIAGNOSIS — E559 Vitamin D deficiency, unspecified: Secondary | ICD-10-CM

## 2024-03-02 DIAGNOSIS — Z7984 Long term (current) use of oral hypoglycemic drugs: Secondary | ICD-10-CM

## 2024-03-02 DIAGNOSIS — I10 Essential (primary) hypertension: Secondary | ICD-10-CM

## 2024-03-02 DIAGNOSIS — E1165 Type 2 diabetes mellitus with hyperglycemia: Secondary | ICD-10-CM

## 2024-03-02 LAB — POCT URINALYSIS DIP (CLINITEK)
Bilirubin, UA: NEGATIVE
Glucose, UA: NEGATIVE mg/dL
Ketones, POC UA: NEGATIVE mg/dL
Nitrite, UA: NEGATIVE
POC PROTEIN,UA: >=300 — AB
Spec Grav, UA: 1.015 (ref 1.010–1.025)
Urobilinogen, UA: 0.2 U/dL
pH, UA: 5.5 (ref 5.0–8.0)

## 2024-03-02 LAB — POCT GLYCOSYLATED HEMOGLOBIN (HGB A1C): HbA1c, POC (controlled diabetic range): 6.5 % (ref 0.0–7.0)

## 2024-03-02 MED ORDER — JANUVIA 100 MG PO TABS
100.0000 mg | ORAL_TABLET | Freq: Every day | ORAL | 1 refills | Status: DC
  Filled 2024-03-02: qty 90, 90d supply, fill #0

## 2024-03-02 MED ORDER — VALSARTAN-HYDROCHLOROTHIAZIDE 320-25 MG PO TABS
1.0000 | ORAL_TABLET | Freq: Every day | ORAL | 1 refills | Status: AC
  Filled 2024-03-02 – 2024-08-02 (×2): qty 90, 90d supply, fill #0
  Filled 2024-10-31: qty 90, 90d supply, fill #1

## 2024-03-02 MED ORDER — PANTOPRAZOLE SODIUM 40 MG PO TBEC
40.0000 mg | DELAYED_RELEASE_TABLET | Freq: Every day | ORAL | 1 refills | Status: DC
  Filled 2024-03-02 – 2024-05-03 (×2): qty 90, 90d supply, fill #0
  Filled 2024-08-02: qty 90, 90d supply, fill #1

## 2024-03-02 MED ORDER — ONDANSETRON HCL 4 MG PO TABS
4.0000 mg | ORAL_TABLET | Freq: Three times a day (TID) | ORAL | 1 refills | Status: DC | PRN
  Filled 2024-03-02: qty 30, 10d supply, fill #0
  Filled 2024-05-03: qty 30, 10d supply, fill #1

## 2024-03-02 MED ORDER — METFORMIN HCL 1000 MG PO TABS
1000.0000 mg | ORAL_TABLET | Freq: Two times a day (BID) | ORAL | 1 refills | Status: DC
  Filled 2024-03-02 – 2024-05-03 (×2): qty 180, 90d supply, fill #0
  Filled 2024-08-02: qty 180, 90d supply, fill #1

## 2024-03-02 MED ORDER — ERGOCALCIFEROL 1.25 MG (50000 UT) PO CAPS
50000.0000 [IU] | ORAL_CAPSULE | ORAL | 1 refills | Status: DC
  Filled 2024-03-02: qty 8, 56d supply, fill #0

## 2024-03-02 MED ORDER — ATORVASTATIN CALCIUM 20 MG PO TABS
20.0000 mg | ORAL_TABLET | Freq: Every day | ORAL | 1 refills | Status: DC
  Filled 2024-03-02 – 2024-05-03 (×2): qty 90, 90d supply, fill #0
  Filled 2024-08-02: qty 90, 90d supply, fill #1

## 2024-03-02 NOTE — Patient Instructions (Signed)
 Problemas nerviosos en la vejiga (vejiga neurgena): qu debe saber Nerve Problems With the Bladder (Neurogenic Bladder): What to Know  La vejiga neurgena es un problema en los nervios que le ayudan a Geographical information systems officer. El cerebro enva seales a los nervios y msculos de la vejiga para iniciar y TEFL teacher el flujo de [place]. Si usted tiene vejiga neurgena, estos nervios y msculos no funcionan de Engineer, building services en que deberan [place]. Esto puede significar que la vejiga es: Hiperactiva. Esto significa que tiene dificultad para retener la orina. Poco activa. Esto significa que tiene dificultad para Geographical information systems officer. Cules son las causas? Los problemas nerviosos en la vejiga pueden deberse a un dao nervioso o a una afeccin que cambia las seales del cerebro a la vejiga. Hay muchos factores que pueden causar [employer]. Incluyen los siguientes: Una enfermedad, como: Parlisis cerebral. Esclerosis mltiple. La enfermedad de Parkinson. Dao en el cerebro o la mdula espinal. Esto puede deberse a lo siguiente: Tumores. Estos son crecimientos de clulas que no son normales. Una infeccin. Ciruga. Beber mucho alcohol. Un accidente cerebrovascular. Qu incrementa el riesgo? Es ms probable que desarrolle vejiga neurgena si tiene: Dao nervioso. Una enfermedad nerviosa de nacimiento. Cules son los signos o sntomas? Fuga de gotas o chorros de [place]. Necesidad repentina e intensa de Geographical information systems officer. Orinar mucho durante el da y la noche. Imposibilidad de vaciar la vejiga por completo al [employer]. Muchas infecciones de las vas urinarias (IU). Cmo se diagnostica? Se puede diagnosticar en funcin de los sntomas, los antecedentes mdicos y un examen. Es posible que le pidan que lleve un registro de sus sntomas y los momentos en que [place]. Tambin pueden hacerle pruebas. Pueden incluir: Un anlisis de [place] para determinar si hay una infeccin. Una exploracin de la vejiga. Esta se hace para conocer la cantidad de  orina que queda en la vejiga despus de Geographical information systems officer. Pruebas para estudiar el flujo de la [place]. Estas se llaman pruebas urodinmicas. Un estudio para ver la vejiga con [facility]. Estudios de diagnstico por imgenes del cerebro o la mdula espinal, tales como: Hotel manager (RM). Una exploracin por tomografa computarizada (TC). Tambin es posible que deba consultar a un experto en el tratamiento de problemas de la vejiga llamado urlogo. Cmo se trata? El tratamiento depende de la causa de los problemas nerviosos y de los sntomas que usted tenga. Trabaje estrechamente con el mdico para encontrar los tratamientos ms adecuados para usted. Pueden incluir: Aprender [employer] de controlar el momento de orinar como, por ejemplo: Ilsa Maltese a horarios establecidos. Entrenar para retrasar el momento de Geographical information systems officer. Hacer ejercicios para fortalecer los msculos de la vejiga. Estos se llaman ejercicios de [place]. Evitar las comidas y bebidas que [employer] sntomas. Tomar medicamentos para: Que la vejiga trabaje si est poco activa. Que la vejiga se relaje si es hiperactiva. Tratar una infeccin urinaria (IU). Usar un tubo blando llamado catter para vaciar la vejiga. Someterse a una ciruga para ayudar a los nervios que controlan la vejiga. Siga estas instrucciones en su casa: Estilo de vida Lleve un registro de los [place], las bebidas y otras cosas que empeoran los sntomas. Use su diario para establecer los horarios en los que Clinical research associate. Si saldr de su casa, organcese de modo de estar cerca de un bao cuando lo necesite. Limite las bebidas que pueden hacerlo orinar. Estas bebidas incluyen refrescos, caf y t. Despus de orinar, espere un minuto y vuelva a intentarlo. Asegrese de orinar justo antes de salir de su casa y  justo antes de [place]. Ejercicios de Kegel Haga ejercicios de Kegel para ayudar a fortalecer los msculos de la vejiga. Estos son los msculos que se usan  para retener la orina cuando uno necesita Geographical information systems officer. Para hacer los ejercicios: Apriete los msculos como si tratara de cortar el chorro de [place]. Debe sentir que se elevan los msculos. Si es [place], tambin puede sentir una elevacin en la zona que rodea la vagina. Mantenga el vientre, las nalgas y las piernas [address] Mantenga los msculos tensos durante 5 a 10 segundos. Relaje los msculos durante 5 a 10 segundos. Repita este ejercicio 10 veces. Haga este ejercicio 3 veces al da o tantas veces como se lo hayan indicado. Instrucciones generales [place] los medicamentos nicamente segn las indicaciones. Comunquese con un mdico si: Los sntomas no mejoran. Los sntomas empeoran. Presenta signos de IU. Pueden incluir: [place] al [employer]. Fiebre o escalofros. Baby Bolt turbia o con sangre. Solicite ayuda de inmediato si: No puede orinar. Esta informacin no tiene Theme park manager el consejo del mdico. Asegrese de hacerle al mdico cualquier pregunta que tenga. Document Revised: 07/26/2023 Document Reviewed: 07/26/2023 Elsevier Patient Education  2024 [employer].

## 2024-03-02 NOTE — Progress Notes (Signed)
 Subjective:  Patient ID: John Meza, male    DOB: [birth year]  Age: 63 y.o. MRN: [id number]  CC: Medical Management of Chronic Issues     Discussed the use of AI scribe software for clinical note transcription with the patient, who gave verbal consent to proceed.  History of Present Illness John Meza is a 62 y.o. 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 who presents with concerns about urinary tract infections.  He uses a catheter due to neurogenic bladder and lacks sensation during urination. He performs catheterization two to three times daily with disposable catheters. He is concerned about urinary tract infections, as he often does not realize he has an infection until it becomes severe, leading to fever and chills and requiring emergency department visits.  His most recent A1c was 6.5, improved from 7.4. He takes weekly vitamin D  supplements for previously low levels and had an eye exam within the last year for diabetes management. No pain during the examination. Blood pressure is slightly elevated and he endorses adherence with his statin. Accompanied by spouse to this visit.    Past Medical History:  Diagnosis Date   Cellulitis and abscess of buttock 10/2016   Diabetes mellitus    ESBL (extended spectrum beta-lactamase) producing bacteria infection 03/16/2022   Hyperkalemia 03/16/2022   Hypertension    Myositis 04/21/2022   Paraplegia (HCC) 2013   fell from ladder   Pelvic abscess in male [facility]) 05/01/2023   Pressure ulcer, buttock, right, unstageable (HCC) 12/30/2020   Pyelonephritis due to Escherichia coli 03/16/2022   Sacral decubitus ulcer, stage IV (HCC) 11/03/2021   SCI (spinal cord injury)    Sepsis due to Escherichia coli (E. coli) (HCC) 03/01/2022   Septic hip (HCC) 04/28/2023   Spine  fracture 11/16/2011   T 11- T9-L1    Past Surgical History:  Procedure Laterality Date   APPLICATION OF WOUND VAC Right 05/01/2023   Procedure: APPLICATION OF WOUND VAC;  Surgeon: Timothy Ford, MD;  Location: [facility] OR;  Service: Orthopedics;  Laterality: Right;   CHOLECYSTECTOMY N/A 05/01/2015   Procedure: LAPAROSCOPIC CHOLECYSTECTOMY WITH ATTEMPTED INTRAOPERATIVE CHOLANGIOGRAM;  Surgeon: Dorena Gander, MD;  Location: [facility] OR;  Service: General;  Laterality: N/A;   HIP DISARTICULATION Right 05/01/2023   Procedure: RIGHT HIP DISARTICULATION;  Surgeon: Timothy Ford, MD;  Location: [facility] OR;  Service: Orthopedics;  Laterality: Right;   IR US  GUIDE BX ASP/DRAIN  04/27/2023   SPINE SURGERY     TEE WITHOUT CARDIOVERSION N/A 03/26/2022   Procedure: TRANSESOPHAGEAL ECHOCARDIOGRAM (TEE);  Surgeon: Alroy Aspen Lela Purple, MD;  Location: [facility] ENDOSCOPY;  Service: Cardiovascular;  Laterality: N/A;   WOUND EXPLORATION Right 05/01/2023   Procedure: WOUND EXPLORATION OF HIP;  Surgeon: Timothy Ford, MD;  Location: [facility] OR;  Service: Orthopedics;  Laterality: Right;    Family History  Problem Relation Age of Onset   Healthy Mother    Diabetes Father    Diabetes Sister     Social History   Socioeconomic History   Marital status: Married    Spouse name: Not on file   Number of children: Not on file   Years of education: Not on file   Highest education level: Not on file  Occupational History   Occupation: disabled  Tobacco Use   Smoking status: Former    Current packs/day: 0.00  Average packs/day: 1 pack/day for 5.0 years (5.0 ttl pk-yrs)    Types: Cigarettes    Start date: 10/12/2006    Quit date: 10/13/2011    Years since quitting: 12.3   Smokeless tobacco: Never   Tobacco comments:    03-24-19 per pt he stopped 1 mo ago   Vaping Use   Vaping status: Never Used  Substance and Sexual Activity   Alcohol use: Not Currently    Alcohol/week: 1.0 standard drink of alcohol    Types: 1 Cans of beer per week    Drug use: Never   Sexual activity: Not on file  Other Topics Concern   Not on file  Social History Narrative   Not on file   Social Drivers of Health   Financial Resource Strain: Low Risk  (08/26/2023)   Overall Financial Resource Strain (CARDIA)    Difficulty of Paying Living Expenses: Not hard at all  Food Insecurity: No Food Insecurity (08/26/2023)   Hunger Vital Sign    Worried About Running Out of Food in the Last Year: Never true    Ran Out of Food in the Last Year: Never true  Transportation Needs: No Transportation Needs (08/26/2023)   PRAPARE - Administrator, Civil Service (Medical): No    Lack of Transportation (Non-Medical): No  Physical Activity: Inactive (08/26/2023)   Exercise Vital Sign    Days of Exercise per Week: 0 days    Minutes of Exercise per Session: 0 min  Stress: No Stress Concern Present (08/26/2023)   [employer] of Occupational Health - Occupational Stress Questionnaire    Feeling of Stress : Only a little  Social Connections: Moderately Integrated (08/26/2023)   Social Connection and Isolation Panel [NHANES]    Frequency of Communication with Friends and Family: Three times a week    Frequency of Social Gatherings with Friends and Family: Twice a week    Attends Religious Services: Never    Database administrator or Organizations: Yes    Attends Engineer, structural: More than 4 times per year    Marital Status: Married    Allergies  Allergen Reactions   Macrobid  [Nitrofurantoin ] Itching and Rash    Outpatient Medications Prior to Visit  Medication Sig Dispense Refill   ferrous sulfate  (FEROSUL) 325 (65 FE) MG tablet Take 1 tablet (325 mg total) by mouth daily. 30 tablet 0   atorvastatin  (LIPITOR) 20 MG tablet Take 1 tablet (20 mg total) by mouth daily at 12 noon. 90 tablet 1   ergocalciferol  (DRISDOL ) 1.25 MG (50000 UT) capsule Take 1 capsule (50,000 Units total) by mouth once a week. 12 capsule 1   JANUVIA  100  MG tablet Take 1 tablet (100 mg total) by mouth daily. 90 tablet 1   metFORMIN  (GLUCOPHAGE ) 1000 MG tablet Take 1 tablet (1,000 mg total) by mouth 2 (two) times daily with a meal. 180 tablet 1   ondansetron  (ZOFRAN ) 4 MG tablet Take 1 tablet (4 mg total) by mouth every 8 (eight) hours as needed for nausea or vomiting. 30 tablet 1   pantoprazole  (PROTONIX ) 40 MG tablet Take 1 tablet (40 mg total) by mouth at bedtime. 90 tablet 1   valsartan -hydrochlorothiazide  (DIOVAN -HCT) 320-25 MG tablet Take 1 tablet by mouth daily. 90 tablet 1   cephALEXin  (KEFLEX ) 500 MG capsule Take 1 capsule (500 mg total) by mouth 2 (two) times daily. (Patient not taking: Reported on 03/02/2024) 14 capsule 0   No facility-administered medications prior  to visit.     ROS Review of Systems  Constitutional:  Negative for activity change and appetite change.  HENT:  Negative for sinus pressure and sore throat.   Respiratory:  Negative for chest tightness, shortness of breath and wheezing.   Cardiovascular:  Negative for chest pain and palpitations.  Gastrointestinal:  Negative for abdominal distention, abdominal pain and constipation.  Genitourinary: Negative.   Musculoskeletal: Negative.   Psychiatric/Behavioral:  Negative for behavioral problems and dysphoric mood.     Objective:  BP (!) 147/79   Pulse 68   Ht 5\' 4"  (1.626 m)   SpO2 99%   BMI 27.40 kg/m      03/02/2024   10:41 AM 03/02/2024   10:04 AM 12/01/2023   10:32 AM  BP/Weight  Systolic BP 147 155 136  Diastolic BP 79 75 76      Physical Exam Constitutional:      Appearance: He is well-developed.  Cardiovascular:     Rate and Rhythm: Normal rate.     Heart sounds: Normal heart sounds. No murmur heard. Pulmonary:     Effort: Pulmonary effort is normal.     Breath sounds: Normal breath sounds. No wheezing or rales.  Chest:     Chest wall: No tenderness.  Abdominal:     General: Bowel sounds are normal. There is no distension.      Palpations: Abdomen is soft. There is no mass.     Tenderness: There is no abdominal tenderness.  Musculoskeletal:     Comments: R lower extremity hip disarticulation  Neurological:     Mental Status: He is alert and oriented to person, place, and time.  Psychiatric:        Mood and Affect: Mood normal.        Latest Ref Rng & Units 12/01/2023   11:35 AM 08/27/2023   11:02 AM 05/27/2023    9:38 AM  CMP  Glucose 70 - 99 mg/dL 540   81   BUN 8 - 27 mg/dL 22   19   Creatinine 9.81 - 1.27 mg/dL 1.91   4.78   Sodium 295 - 144 mmol/L 138   137   Potassium 3.5 - 5.2 mmol/L 4.9   4.8   Chloride 96 - 106 mmol/L 103   105   CO2 20 - 29 mmol/L 21   20   Calcium  8.6 - 10.2 mg/dL 9.1   9.2   Total Protein 6.0 - 8.5 g/dL 6.7  7.1  6.4   Total Bilirubin 0.0 - 1.2 mg/dL 0.2  0.3  0.3   Alkaline Phos 44 - 121 IU/L 118  148  141   AST 0 - 40 IU/L 11  16  17    ALT 0 - 44 IU/L 12  18  22      Lipid Panel     Component Value Date/Time   CHOL 122 05/27/2023 0938   TRIG 123 05/27/2023 0938   HDL 44 05/27/2023 0938   CHOLHDL 2.8 05/27/2023 0938   CHOLHDL 11.5 05/05/2015 0352   VLDL 35 05/05/2015 0352   LDLCALC 56 05/27/2023 0938    CBC    Component Value Date/Time   WBC 7.5 12/14/2023 1000   WBC 6.0 09/15/2023 1016   RBC 3.83 (L) 12/14/2023 1000   HGB 11.8 (L) 12/14/2023 1000   HGB 11.2 (L) 05/27/2023 0938   HCT 34.6 (L) 12/14/2023 1000   HCT 34.4 (L) 05/27/2023 0938   PLT 163 12/14/2023 1000  PLT 188 05/27/2023 0938   MCV 90.3 12/14/2023 1000   MCV 93 05/27/2023 0938   MCH 30.8 12/14/2023 1000   MCHC 34.1 12/14/2023 1000   RDW 12.9 12/14/2023 1000   RDW 13.7 05/27/2023 0938   LYMPHSABS 2.0 12/14/2023 1000   LYMPHSABS 1.6 05/27/2023 0938   MONOABS 0.5 12/14/2023 1000   EOSABS 0.1 12/14/2023 1000   EOSABS 0.3 05/27/2023 0938   BASOSABS 0.0 12/14/2023 1000   BASOSABS 0.1 05/27/2023 0938    Lab Results  Component Value Date   HGBA1C 6.5 03/02/2024      1. Controlled  type 2 diabetes mellitus with hyperglycemia, without long-term current use of insulin  (HCC) (Primary) Controlled with A1c of 6.5 Continue current regimen Counseled on Diabetic diet, my plate method, 811 minutes of moderate intensity exercise/week Blood sugar logs with fasting goals of 80-120 mg/dl, random of less than 914 and in the event of sugars less than 60 mg/dl or greater than 782 mg/dl encouraged to notify the clinic. Advised on the need for annual eye exams, annual foot exams, Pneumonia vaccine. - POCT glycosylated hemoglobin (Hb A1C) - atorvastatin  (LIPITOR) 20 MG tablet; Take 1 tablet (20 mg total) by mouth daily at 12 noon.  Dispense: 90 tablet; Refill: 1 - JANUVIA  100 MG tablet; Take 1 tablet (100 mg total) by mouth daily.  Dispense: 90 tablet; Refill: 1 - metFORMIN  (GLUCOPHAGE ) 1000 MG tablet; Take 1 tablet (1,000 mg total) by mouth 2 (two) times daily with a meal.  Dispense: 180 tablet; Refill: 1  2. Urinary symptom or sign Advised that we do not need to perform a urinalysis at every visit as that is not the standard of care as this will lead to unnecessary antibiotic prescription and possible antibiotic resistance down the road. He already provided a urine specimen which we ran today and revealed negative nitrites with positive leukocyte esterase.  I will send this off for culture but also send a CBC to see if this indicates presence of infection - POCT URINALYSIS DIP (CLINITEK) - CBC with Differential/Platelet - Urine Culture  3. Vitamin D  deficiency - ergocalciferol  (DRISDOL ) 1.25 MG (50000 UT) capsule; Take 1 capsule (50,000 Units total) by mouth once a week.  Dispense: 12 capsule; Refill: 1 - VITAMIN D  25 Hydroxy (Vit-D Deficiency, Fractures)  4. Neurogenic bladder He self catheterizes 3 times a day  5. Essential hypertension Slightly above goal No regimen change today -Counseled on blood pressure goal of less than 130/80, low-sodium, DASH diet, medication compliance,  150 minutes of moderate intensity exercise per week. Discussed medication compliance, adverse effects. - valsartan -hydrochlorothiazide  (DIOVAN -HCT) 320-25 MG tablet; Take 1 tablet by mouth daily.  Dispense: 90 tablet; Refill: 1  6. Gastroesophageal reflux disease without esophagitis Stable - pantoprazole  (PROTONIX ) 40 MG tablet; Take 1 tablet (40 mg total) by mouth at bedtime.  Dispense: 90 tablet; Refill: 1  7. Long term current use of oral hypoglycemic drug - JANUVIA  100 MG tablet; Take 1 tablet (100 mg total) by mouth daily.  Dispense: 90 tablet; Refill: 1 - metFORMIN  (GLUCOPHAGE ) 1000 MG tablet; Take 1 tablet (1,000 mg total) by mouth 2 (two) times daily with a meal.  Dispense: 180 tablet; Refill: 1   Meds ordered this encounter  Medications   atorvastatin  (LIPITOR) 20 MG tablet    Sig: Take 1 tablet (20 mg total) by mouth daily at 12 noon.    Dispense:  90 tablet    Refill:  1   JANUVIA  100 MG tablet  Sig: Take 1 tablet (100 mg total) by mouth daily.    Dispense:  90 tablet    Refill:  1   metFORMIN  (GLUCOPHAGE ) 1000 MG tablet    Sig: Take 1 tablet (1,000 mg total) by mouth 2 (two) times daily with a meal.    Dispense:  180 tablet    Refill:  1   ondansetron  (ZOFRAN ) 4 MG tablet    Sig: Take 1 tablet (4 mg total) by mouth every 8 (eight) hours as needed for nausea or vomiting.    Dispense:  30 tablet    Refill:  1   pantoprazole  (PROTONIX ) 40 MG tablet    Sig: Take 1 tablet (40 mg total) by mouth at bedtime.    Dispense:  90 tablet    Refill:  1   valsartan -hydrochlorothiazide  (DIOVAN -HCT) 320-25 MG tablet    Sig: Take 1 tablet by mouth daily.    Dispense:  90 tablet    Refill:  1   ergocalciferol  (DRISDOL ) 1.25 MG (50000 UT) capsule    Sig: Take 1 capsule (50,000 Units total) by mouth once a week.    Dispense:  12 capsule    Refill:  1    Follow-up: Return in about 6 months (around 09/02/2024).       Joaquin Mulberry, MD, FAAFP. [facility] [place], [place] [phone]   03/02/2024, 2:40 PM

## 2024-03-03 ENCOUNTER — Ambulatory Visit: Payer: Self-pay | Admitting: Family Medicine

## 2024-03-03 DIAGNOSIS — N3000 Acute cystitis without hematuria: Secondary | ICD-10-CM

## 2024-03-03 LAB — CBC WITH DIFFERENTIAL/PLATELET
Basophils Absolute: 0 10*3/uL (ref 0.0–0.2)
Basos: 1 %
EOS (ABSOLUTE): 0.1 10*3/uL (ref 0.0–0.4)
Eos: 2 %
Hematocrit: 36.3 % — ABNORMAL LOW (ref 37.5–51.0)
Hemoglobin: 11.9 g/dL — ABNORMAL LOW (ref 13.0–17.7)
Immature Grans (Abs): 0 10*3/uL (ref 0.0–0.1)
Immature Granulocytes: 0 %
Lymphocytes Absolute: 1.9 10*3/uL (ref 0.7–3.1)
Lymphs: 35 %
MCH: 30.8 pg (ref 26.6–33.0)
MCHC: 32.8 g/dL (ref 31.5–35.7)
MCV: 94 fL (ref 79–97)
Monocytes Absolute: 0.5 10*3/uL (ref 0.1–0.9)
Monocytes: 10 %
Neutrophils Absolute: 2.9 10*3/uL (ref 1.4–7.0)
Neutrophils: 52 %
Platelets: 196 10*3/uL (ref 150–450)
RBC: 3.86 x10E6/uL — ABNORMAL LOW (ref 4.14–5.80)
RDW: 13.4 % (ref 11.6–15.4)
WBC: 5.5 10*3/uL (ref 3.4–10.8)

## 2024-03-03 LAB — VITAMIN D 25 HYDROXY (VIT D DEFICIENCY, FRACTURES): Vit D, 25-Hydroxy: 52.2 ng/mL (ref 30.0–100.0)

## 2024-03-06 LAB — URINE CULTURE

## 2024-03-07 ENCOUNTER — Other Ambulatory Visit: Payer: Self-pay

## 2024-03-07 ENCOUNTER — Encounter: Payer: Self-pay | Admitting: Hematology

## 2024-03-07 MED ORDER — SULFAMETHOXAZOLE-TRIMETHOPRIM 800-160 MG PO TABS
1.0000 | ORAL_TABLET | Freq: Two times a day (BID) | ORAL | 0 refills | Status: AC
  Filled 2024-03-07: qty 14, 7d supply, fill #0

## 2024-03-09 ENCOUNTER — Other Ambulatory Visit: Payer: Self-pay

## 2024-03-15 ENCOUNTER — Inpatient Hospital Stay: Payer: Self-pay | Attending: Hematology

## 2024-03-15 DIAGNOSIS — E119 Type 2 diabetes mellitus without complications: Secondary | ICD-10-CM | POA: Insufficient documentation

## 2024-03-15 DIAGNOSIS — D500 Iron deficiency anemia secondary to blood loss (chronic): Secondary | ICD-10-CM | POA: Insufficient documentation

## 2024-03-15 DIAGNOSIS — D509 Iron deficiency anemia, unspecified: Secondary | ICD-10-CM

## 2024-03-15 DIAGNOSIS — D649 Anemia, unspecified: Secondary | ICD-10-CM

## 2024-03-15 DIAGNOSIS — G8220 Paraplegia, unspecified: Secondary | ICD-10-CM | POA: Insufficient documentation

## 2024-03-15 DIAGNOSIS — Z79899 Other long term (current) drug therapy: Secondary | ICD-10-CM | POA: Insufficient documentation

## 2024-03-15 DIAGNOSIS — D638 Anemia in other chronic diseases classified elsewhere: Secondary | ICD-10-CM | POA: Insufficient documentation

## 2024-03-15 DIAGNOSIS — Z993 Dependence on wheelchair: Secondary | ICD-10-CM | POA: Insufficient documentation

## 2024-03-15 LAB — CBC WITH DIFFERENTIAL (CANCER CENTER ONLY)
Abs Immature Granulocytes: 0.02 10*3/uL (ref 0.00–0.07)
Basophils Absolute: 0 10*3/uL (ref 0.0–0.1)
Basophils Relative: 1 %
Eosinophils Absolute: 0.1 10*3/uL (ref 0.0–0.5)
Eosinophils Relative: 1 %
HCT: 31.8 % — ABNORMAL LOW (ref 39.0–52.0)
Hemoglobin: 11 g/dL — ABNORMAL LOW (ref 13.0–17.0)
Immature Granulocytes: 0 %
Lymphocytes Relative: 24 %
Lymphs Abs: 1.4 10*3/uL (ref 0.7–4.0)
MCH: 30.7 pg (ref 26.0–34.0)
MCHC: 34.6 g/dL (ref 30.0–36.0)
MCV: 88.8 fL (ref 80.0–100.0)
Monocytes Absolute: 0.6 10*3/uL (ref 0.1–1.0)
Monocytes Relative: 11 %
Neutro Abs: 3.5 10*3/uL (ref 1.7–7.7)
Neutrophils Relative %: 63 %
Platelet Count: 181 10*3/uL (ref 150–400)
RBC: 3.58 MIL/uL — ABNORMAL LOW (ref 4.22–5.81)
RDW: 13.4 % (ref 11.5–15.5)
WBC Count: 5.6 10*3/uL (ref 4.0–10.5)
nRBC: 0 % (ref 0.0–0.2)

## 2024-03-15 LAB — IRON AND IRON BINDING CAPACITY (CC-WL,HP ONLY)
Iron: 48 ug/dL (ref 45–182)
Saturation Ratios: 25 % (ref 17.9–39.5)
TIBC: 190 ug/dL — ABNORMAL LOW (ref 250–450)
UIBC: 142 ug/dL (ref 117–376)

## 2024-03-15 LAB — FERRITIN: Ferritin: 233 ng/mL (ref 24–336)

## 2024-03-16 ENCOUNTER — Ambulatory Visit: Payer: Self-pay | Admitting: Nurse Practitioner

## 2024-03-17 ENCOUNTER — Other Ambulatory Visit: Payer: Self-pay

## 2024-03-17 ENCOUNTER — Encounter (HOSPITAL_COMMUNITY): Payer: Self-pay

## 2024-03-17 ENCOUNTER — Emergency Department (HOSPITAL_COMMUNITY): Payer: Self-pay

## 2024-03-17 ENCOUNTER — Emergency Department (HOSPITAL_COMMUNITY)
Admission: EM | Admit: 2024-03-17 | Discharge: 2024-03-17 | Disposition: A | Discharge status: Home / Self Care | Payer: Self-pay | Attending: Emergency Medicine | Admitting: Emergency Medicine

## 2024-03-17 ENCOUNTER — Encounter: Payer: Self-pay | Admitting: Hematology

## 2024-03-17 DIAGNOSIS — Z79899 Other long term (current) drug therapy: Secondary | ICD-10-CM | POA: Insufficient documentation

## 2024-03-17 DIAGNOSIS — Z7984 Long term (current) use of oral hypoglycemic drugs: Secondary | ICD-10-CM | POA: Insufficient documentation

## 2024-03-17 DIAGNOSIS — M25462 Effusion, left knee: Secondary | ICD-10-CM | POA: Insufficient documentation

## 2024-03-17 DIAGNOSIS — E119 Type 2 diabetes mellitus without complications: Secondary | ICD-10-CM | POA: Insufficient documentation

## 2024-03-17 DIAGNOSIS — I10 Essential (primary) hypertension: Secondary | ICD-10-CM | POA: Insufficient documentation

## 2024-03-17 LAB — CBC WITH DIFFERENTIAL/PLATELET
Abs Immature Granulocytes: 0.02 10*3/uL (ref 0.00–0.07)
Basophils Absolute: 0 10*3/uL (ref 0.0–0.1)
Basophils Relative: 1 %
Eosinophils Absolute: 0.1 10*3/uL (ref 0.0–0.5)
Eosinophils Relative: 1 %
HCT: 34 % — ABNORMAL LOW (ref 39.0–52.0)
Hemoglobin: 11.4 g/dL — ABNORMAL LOW (ref 13.0–17.0)
Immature Granulocytes: 0 %
Lymphocytes Relative: 31 %
Lymphs Abs: 1.8 10*3/uL (ref 0.7–4.0)
MCH: 31.3 pg (ref 26.0–34.0)
MCHC: 33.5 g/dL (ref 30.0–36.0)
MCV: 93.4 fL (ref 80.0–100.0)
Monocytes Absolute: 0.5 10*3/uL (ref 0.1–1.0)
Monocytes Relative: 9 %
Neutro Abs: 3.3 10*3/uL (ref 1.7–7.7)
Neutrophils Relative %: 58 %
Platelets: 170 10*3/uL (ref 150–400)
RBC: 3.64 MIL/uL — ABNORMAL LOW (ref 4.22–5.81)
RDW: 13.3 % (ref 11.5–15.5)
WBC: 5.7 10*3/uL (ref 4.0–10.5)
nRBC: 0 % (ref 0.0–0.2)

## 2024-03-17 LAB — COMPREHENSIVE METABOLIC PANEL WITH GFR
ALT: 20 U/L (ref 0–44)
AST: 17 U/L (ref 15–41)
Albumin: 3.2 g/dL — ABNORMAL LOW (ref 3.5–5.0)
Alkaline Phosphatase: 75 U/L (ref 38–126)
Anion gap: 6 (ref 5–15)
BUN: 21 mg/dL — ABNORMAL HIGH (ref 6–20)
CO2: 22 mmol/L (ref 22–32)
Calcium: 8.8 mg/dL — ABNORMAL LOW (ref 8.9–10.3)
Chloride: 108 mmol/L (ref 98–111)
Creatinine, Ser: 1.1 mg/dL (ref 0.61–1.24)
GFR, Estimated: >60 mL/min (ref >60)
Glucose, Bld: 104 mg/dL — ABNORMAL HIGH (ref 70–99)
Potassium: 4.7 mmol/L (ref 3.5–5.1)
Sodium: 136 mmol/L (ref 135–145)
Total Bilirubin: 0.5 mg/dL (ref 0.0–1.2)
Total Protein: 6.8 g/dL (ref 6.5–8.1)

## 2024-03-17 NOTE — ED Provider Notes (Signed)
 [facility] Provider Note   CSN: [id number] Arrival date & time: 03/17/24  1312     History  Chief Complaint  Patient presents with   Leg Pain    John Meza is a 63 y.o. male.  63 y.o. year old male with a history of history of HTN, Paraplegia, neurogenic bladder, type 2 DM, history of right hip disarticulation presenting to the emergency department with left knee swelling.  The patient states that he has noticed swelling to his knee for the past 3 days.  He denies any trauma or falls.  He states that he has no sensation or motor use of this leg at baseline.  He has noticed no redness and has had no fevers.  The history is provided by the patient and the spouse. A language interpreter was used Solomon Dupre ID [phone]).  Leg Pain      Home Medications Prior to Admission medications   Medication Sig Start Date End Date Taking? Authorizing Provider  atorvastatin  (LIPITOR) 20 MG tablet Take 1 tablet (20 mg total) by mouth daily at 12 noon. 03/02/24   Newlin, Enobong, MD  cephALEXin  (KEFLEX ) 500 MG capsule Take 1 capsule (500 mg total) by mouth 2 (two) times daily. Patient not taking: Reported on 03/02/2024 12/01/23   Newlin, Enobong, MD  ergocalciferol  (DRISDOL ) 1.25 MG (50000 UT) capsule Take 1 capsule (50,000 Units total) by mouth once a week. 03/02/24   Newlin, Enobong, MD  ferrous sulfate  (FEROSUL) 325 (65 FE) MG tablet Take 1 tablet (325 mg total) by mouth daily. 12/01/23   Newlin, Enobong, MD  JANUVIA  100 MG tablet Take 1 tablet (100 mg total) by mouth daily. 03/02/24   Newlin, Enobong, MD  metFORMIN  (GLUCOPHAGE ) 1000 MG tablet Take 1 tablet (1,000 mg total) by mouth 2 (two) times daily with a meal. 03/02/24   Joaquin Mulberry, MD  ondansetron  (ZOFRAN ) 4 MG tablet Take 1 tablet (4 mg total) by mouth every 8 (eight) hours as needed for nausea or vomiting. 03/02/24   Newlin, Enobong, MD  pantoprazole  (PROTONIX ) 40 MG tablet  Take 1 tablet (40 mg total) by mouth at bedtime. 03/02/24   Newlin, Enobong, MD  valsartan -hydrochlorothiazide  (DIOVAN -HCT) 320-25 MG tablet Take 1 tablet by mouth daily. 03/02/24   Newlin, Enobong, MD  loratadine  (CLARITIN ) 10 MG tablet Take 1 tablet (10 mg total) by mouth daily. As needed for itchy throat/allergy symptoms Patient not taking: Reported on 03/24/2019 12/26/18 07/05/20  Berneda Bridges, MD      Allergies    Macrobid  [nitrofurantoin ]    Review of Systems   Review of Systems  Physical Exam Updated Vital Signs BP (!) 148/85   Pulse 81   Temp 98.6 F (37 C)   Resp 18   SpO2 98%  Physical Exam Vitals and nursing note reviewed.  Constitutional:      General: He is not in acute distress.    Appearance: Normal appearance.  HENT:     Head: Normocephalic.     Nose: Nose normal.     Mouth/Throat:     Mouth: Mucous membranes are moist.  Eyes:     Extraocular Movements: Extraocular movements intact.  Cardiovascular:     Rate and Rhythm: Normal rate.  Pulmonary:     Effort: Pulmonary effort is normal.  Abdominal:     General: Abdomen is flat.  Musculoskeletal:     Cervical back: Normal range of motion.     Comments: Palpable L  knee joint effusion, no overlying erythema or warmth, full passive ROM, no knee joint laxity  Skin:    General: Skin is warm and dry.  Neurological:     Mental Status: He is alert and oriented to person, place, and time. Mental status is at baseline.  Psychiatric:        Mood and Affect: Mood normal.        Behavior: Behavior normal.     ED Results / Procedures / Treatments   Labs (all labs ordered are listed, but only abnormal results are displayed) Labs Reviewed  CBC WITH DIFFERENTIAL/PLATELET - Abnormal; Notable for the following components:      Result Value   RBC 3.64 (*)    Hemoglobin 11.4 (*)    HCT 34.0 (*)    All other components within normal limits  COMPREHENSIVE METABOLIC PANEL WITH GFR - Abnormal; Notable for the following  components:   Glucose, Bld 104 (*)    BUN 21 (*)    Calcium  8.8 (*)    Albumin  3.2 (*)    All other components within normal limits    EKG None  Radiology DG Knee Complete 4 Views Left Result Date: 03/17/2024 CLINICAL DATA:  swelling EXAM: LEFT KNEE - COMPLETE 4+ VIEW COMPARISON:  None Available. FINDINGS: Osteopenia.No acute fracture or dislocation. Small to moderate volume joint effusion. There is no evidence of arthropathy or other focal bone abnormality. Fairly diffuse muscular atrophy. IMPRESSION: Osteopenia. Small to moderate volume joint effusion. No acute fracture or dislocation. Electronically Signed   By: Rance Burrows M.D.   On: 03/17/2024 16:35    Procedures Procedures    Medications Ordered in ED Medications - No data to display  ED Course/ Medical Decision Making/ A&P                                 Medical Decision Making This patient presents to the ED with chief complaint(s) of knee swelling with pertinent past medical history of HTN, Paraplegia, neurogenic bladder, type 2 DM, history of right hip disarticulation which further complicates the presenting complaint. The complaint involves an extensive differential diagnosis and also carries with it a high risk of complications and morbidity.    The differential diagnosis includes fracture, dislocation, arthritis, no signs of septic joint on exam, gout less likely as no erythema or pain, no history of trauma, making a traumatic ligamentous injury unlikely  Additional history obtained: Additional history obtained from spouse Records reviewed Primary Care Documents  ED Course and Reassessment: On patient's arrival he is hemodynamically stable in no acute distress.  He was initially evaluated in triage and had labs and knee x-ray performed.  Patient's labs are within normal range, x-ray showed knee joint effusion but no other bony abnormalities.  He has no erythema or warmth and is full passive knee ROM, he is  paraplegic and has no active ROM baseline.  Patient will be given Ace wrap and was recommended ice and elevation and outpatient follow-up.  He was given strict return precautions.  Independent labs interpretation:  The following labs were independently interpreted: Within normal range  Independent visualization of imaging: - I independently visualized the following imaging with scope of interpretation limited to determining acute life threatening conditions related to emergency care: Left knee x-ray, which revealed knee joint effusion  Consultation: - Consulted or discussed management/test interpretation w/ external professional: N/A  Consideration for admission or further workup:  Patient has no emergent conditions requiring admission or further work-up at this time and is stable for discharge home with primary care and ortho follow-up  Social Determinants of health: N/A            Final Clinical Impression(s) / ED Diagnoses Final diagnoses:  Effusion of left knee joint    Rx / DC Orders ED Discharge Orders     None         Kingsley, Halli Equihua K, DO 03/17/24 2101

## 2024-03-17 NOTE — ED Triage Notes (Addendum)
 Pt c/o left knee swelling for 3 days, pt states he does not have sensation from hip to foot which has been there since 2013. Denies fevers.

## 2024-03-17 NOTE — Telephone Encounter (Addendum)
 Called patient to relay message below with interpreter as per Lacie Burton NP. Patients wife voiced full understanding and had no further questions at this time.   ----- Message from Lacie K Burton sent at 03/16/2024  4:36 PM EDT ----- Please let pt know labs, anemia is stable and ferritin is normal, he does not need IV iron  at this time. Continue oral iron  with Vit C source and keep 3 month appts.   Thanks Lacie NP

## 2024-03-17 NOTE — Discharge Instructions (Signed)
 You were seen in the emergency department for your knee swelling.  You do have fluid in your knee joint but no signs that this fluid is infected and no signs of broken bones or injury to your knee.  Is unclear what is causing the swelling at this time by giving you an Ace wrap to help with the swelling and you can ice your knee and keep it elevated.  You can follow-up with orthopedics to have your knee rechecked and for further evaluation.  You should return to the emergency department if your knee turns red, is hot to touch, you have fevers or if you have any other new or concerning symptoms.  Lo atendieron en urgencias por inflamacin de rodilla. Tiene lquido en la articulacin, pero no hay signos de infeccin ni signos de fracturas ni lesiones en la rodilla. Se desconoce la causa de la inflamacin. Le administraron una venda elstica para [place] y puede aplicar hielo en la rodilla y [place]. Puede consultar con un traumatlogo para que le revisen la rodilla y le hagan una evaluacin adicional. Debe regresar a urgencias si la rodilla se enrojece, est caliente al tacto, tiene fiebre o presenta cualquier otro sntoma nuevo o preocupante.

## 2024-03-17 NOTE — ED Provider Triage Note (Signed)
 Emergency Medicine Provider Triage Evaluation Note  John Meza , a 63 y.o. male  was evaluated in triage.  Pt complains of left knee swelling for the past 3 days.  Atraumatic.  Denies any recent falls.  Patient is paralyzed from waist down from previous injury.  Does not have any sensation to the area.  Reports it has been warm and swollen but no redness.  Has not had previous symptoms this before.  Denies any fever or recent illnesses.  Review of Systems  Positive:  Negative:   Physical Exam  BP (!) 151/81 (BP Location: Left Arm)   Pulse 77   Temp 98.5 F (36.9 C)   Resp 15   SpO2 98%  Gen:   Awake, no distress   Resp:  Normal effort  MSK:   Moves extremities without difficulty  Other:  Soft prepatellar swelling.  Some mild increase in warmth but no overlying erythema, induration, or fluctuance.  Compartments are soft.  Palpable distal pulse.  Medical Decision Making  Medically screening exam initiated at 3:37 PM.  Appropriate orders placed.  John Meza was informed that the remainder of the evaluation will be completed by another provider, this initial triage assessment does not replace that evaluation, and the importance of remaining in the ED until their evaluation is complete.  Labs and imaging ordered   Spence Dux, [place] 03/17/24 1539

## 2024-03-20 ENCOUNTER — Ambulatory Visit: Payer: Self-pay

## 2024-03-20 NOTE — Telephone Encounter (Signed)
 Noted

## 2024-03-20 NOTE — Telephone Encounter (Signed)
 FYI Only or Action Required?: FYI only for provider  Patient was last seen in primary care on 03/02/2024 by John Mulberry, MD. Called Nurse Triage reporting Joint Swelling. Symptoms began a week ago. Interventions attempted: Rest, hydration, or home remedies and Ice/heat application. Symptoms are: unchanged.  Triage Disposition: Go to ED Now (or to Office With PCP Approval) (overriding See Physician Within 24 Hours)  Patient/caregiver understands and will follow disposition?: Yes  Copied from CRM [id number]. Topic: Clinical - Red Word Triage >> Mar 20, 2024 11:47 AM Ivette P wrote: Red Word that prompted transfer to Nurse Triage: Pt wife Shelagh Derrick called in about pt. Swollen knee on the left knee. Knee is still swollen.    Calla Catchings [id number] Reason for Disposition  SEVERE swelling (e.g., can't move swollen knee at all)  Answer Assessment - Initial Assessment Questions 1. LOCATION: "Where is the swelling located?"  (e.g., left, right, both knees)     L knee swelling 2. ONSET: "When did the swelling start?" "Does it come and go, or is it there all the time?"     Tuesday  3. SWELLING: "How bad is the swelling?" Or, "How large is it?" (e.g., mild, moderate, severe; size of localized swelling)    - NONE: No joint swelling.   - LOCALIZED: Localized; small area of puffy or swollen skin (e.g., insect bite, skin irritation).   - MILD: Joint looks or feels mildly swollen or puffy.   - MODERATE: Swollen; interferes with normal activities (e.g., work or school); can't move joint normally (bend and straighten completely); may be limping.   - SEVERE: Very swollen; can't move swollen joint at all; limping a lot or unable to walk.     Same swelling when went to [facility]  4. PAIN: "Is there any pain?" If Yes, ask: "How bad is it?" (Scale 1-10; or mild, moderate, severe)   - NONE (0): no pain.   - MILD (1-3): doesn't interfere with normal activities.    - MODERATE (4-7): interferes with normal activities (e.g., work  or school) or awakens from sleep, limping.    - SEVERE (8-10): excruciating pain, unable to do any normal activities, unable to walk.      Pt in WC and doesn't have much feeling  8. OTHER SYMPTOMS: "Do you have any other symptoms?" (e.g., chest pain, difficulty breathing, fever, calf pain)     no  Protocols used: Knee Swelling-A-AH  Pt's wife Shelagh Derrick states they have been doing elevation, rest, and Ice and nothing has helped decrease the swelling. 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

## 2024-05-03 ENCOUNTER — Other Ambulatory Visit: Payer: Self-pay

## 2024-05-03 ENCOUNTER — Other Ambulatory Visit: Payer: Self-pay | Admitting: Family Medicine

## 2024-05-03 ENCOUNTER — Encounter: Payer: Self-pay | Admitting: Hematology

## 2024-05-03 DIAGNOSIS — D500 Iron deficiency anemia secondary to blood loss (chronic): Secondary | ICD-10-CM

## 2024-05-03 MED ORDER — FERROUS SULFATE 325 (65 FE) MG PO TABS
325.0000 mg | ORAL_TABLET | Freq: Every day | ORAL | 1 refills | Status: DC
  Filled 2024-05-03: qty 90, 90d supply, fill #0

## 2024-05-09 ENCOUNTER — Other Ambulatory Visit: Payer: Self-pay

## 2024-06-13 ENCOUNTER — Other Ambulatory Visit: Payer: Self-pay

## 2024-06-15 ENCOUNTER — Inpatient Hospital Stay: Payer: Self-pay | Attending: Hematology

## 2024-06-15 ENCOUNTER — Inpatient Hospital Stay: Payer: Self-pay | Admitting: Hematology

## 2024-06-15 NOTE — Assessment & Plan Note (Deleted)
-  His iron study supports anemia of chronic disease, most likely related to his infection in June 2023, and long-term diabetes. -Anemia has improved with oral iron and medical management of his chronic disease. 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  However if his anemia does not improve further, I would add an additional lab work for his anemia workup.

## 2024-06-15 NOTE — Assessment & Plan Note (Deleted)
-  His anemia is most likely related to his chronic disease, especially diabetes and complications. -He does have low serum iron, low TIBC, ferritin has been normal in most tests, he probably has component of iron deficiency also, but his iron study is most consistent with anemia of chronic disease. -I will continue his oral iron, we discussed take his iron pill in between Pepcid, and avoid PPI.  I encouraged him to take vitamin C along with iron pill. -His anemia has improved significantly since last year.  Continue to monitor his lab

## 2024-07-25 ENCOUNTER — Other Ambulatory Visit: Payer: Self-pay

## 2024-08-02 ENCOUNTER — Encounter: Payer: Self-pay | Admitting: Hematology

## 2024-08-02 ENCOUNTER — Other Ambulatory Visit: Payer: Self-pay

## 2024-08-03 ENCOUNTER — Other Ambulatory Visit: Payer: Self-pay

## 2024-09-04 ENCOUNTER — Encounter: Payer: Self-pay | Admitting: Family Medicine

## 2024-09-04 ENCOUNTER — Ambulatory Visit: Payer: Self-pay | Attending: Family Medicine | Admitting: Family Medicine

## 2024-09-04 ENCOUNTER — Encounter: Payer: Self-pay | Admitting: Hematology

## 2024-09-04 ENCOUNTER — Other Ambulatory Visit: Payer: Self-pay

## 2024-09-04 VITALS — BP 155/77 | HR 76 | Temp 98.0°F | Ht 64.0 in

## 2024-09-04 DIAGNOSIS — Z7984 Long term (current) use of oral hypoglycemic drugs: Secondary | ICD-10-CM

## 2024-09-04 DIAGNOSIS — Z713 Dietary counseling and surveillance: Secondary | ICD-10-CM

## 2024-09-04 DIAGNOSIS — K219 Gastro-esophageal reflux disease without esophagitis: Secondary | ICD-10-CM

## 2024-09-04 DIAGNOSIS — R600 Localized edema: Secondary | ICD-10-CM

## 2024-09-04 DIAGNOSIS — Z23 Encounter for immunization: Secondary | ICD-10-CM

## 2024-09-04 DIAGNOSIS — Z1211 Encounter for screening for malignant neoplasm of colon: Secondary | ICD-10-CM

## 2024-09-04 DIAGNOSIS — E1169 Type 2 diabetes mellitus with other specified complication: Secondary | ICD-10-CM

## 2024-09-04 LAB — POCT GLYCOSYLATED HEMOGLOBIN (HGB A1C): HbA1c, POC (controlled diabetic range): 6.6 % (ref 0.0–7.0)

## 2024-09-04 MED ORDER — ONDANSETRON HCL 4 MG PO TABS
4.0000 mg | ORAL_TABLET | Freq: Three times a day (TID) | ORAL | 1 refills | Status: DC | PRN
  Filled 2024-09-04: qty 30, 10d supply, fill #0

## 2024-09-04 MED ORDER — AMLODIPINE BESYLATE 2.5 MG PO TABS
2.5000 mg | ORAL_TABLET | Freq: Every day | ORAL | 1 refills | Status: AC
  Filled 2024-09-04: qty 90, 90d supply, fill #0
  Filled 2024-10-31: qty 90, 90d supply, fill #1

## 2024-09-04 MED ORDER — METFORMIN HCL 1000 MG PO TABS
1000.0000 mg | ORAL_TABLET | Freq: Two times a day (BID) | ORAL | 1 refills | Status: AC
  Filled 2024-09-04 – 2024-10-31 (×2): qty 180, 90d supply, fill #0

## 2024-09-04 MED ORDER — ATORVASTATIN CALCIUM 20 MG PO TABS
20.0000 mg | ORAL_TABLET | Freq: Every day | ORAL | 1 refills | Status: AC
  Filled 2024-09-04 – 2024-10-31 (×2): qty 90, 90d supply, fill #0

## 2024-09-04 MED ORDER — PANTOPRAZOLE SODIUM 40 MG PO TBEC
40.0000 mg | DELAYED_RELEASE_TABLET | Freq: Every day | ORAL | 1 refills | Status: AC
  Filled 2024-09-04 – 2024-10-31 (×2): qty 90, 90d supply, fill #0

## 2024-09-04 NOTE — Progress Notes (Signed)
 Subjective:  Patient ID: John Meza, male    DOB: 12/21/1960  Age: 63 y.o. MRN: [id number]  CC: Medical Management of Chronic Issues     Discussed the use of AI scribe software for clinical note transcription with the patient, who gave verbal consent to proceed.  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 He is accompanied by family member. Visit was conducted with the aid of an AM[employer] video interpreter.   His diabetes is well controlled with an A1c of 6.6. He is currently taking metformin  1000 mg twice daily.  His initial blood pressure is elevated and he endorses taking his antihypertensive.  A recheck of his blood pressure is planned. He has not had breakfast today. For his reflux he remains on pantoprazole .   Past Medical History:  Diagnosis Date   Cellulitis and abscess of buttock 10/2016   Diabetes mellitus    ESBL (extended spectrum beta-lactamase) producing bacteria infection 03/16/2022   Hyperkalemia 03/16/2022   Hypertension    Myositis 04/21/2022   Paraplegia (HCC) 2013   fell from ladder   Pelvic abscess in male [facility]) 05/01/2023   Pressure ulcer, buttock, right, unstageable (HCC) 12/30/2020   Pyelonephritis due to Escherichia coli 03/16/2022   Sacral decubitus ulcer, stage IV (HCC) 11/03/2021   SCI (spinal cord injury)    Sepsis due to Escherichia coli (E. coli) (HCC) 03/01/2022   Septic hip (HCC) 04/28/2023   Spine fracture 11/16/2011   T 11- T9-L1    Past Surgical History:  Procedure Laterality Date   APPLICATION OF WOUND VAC Right 05/01/2023   Procedure: APPLICATION OF WOUND VAC;  Surgeon: Harden Jerona GAILS, MD;  Location: [facility] OR;  Service: Orthopedics;  Laterality: Right;   CHOLECYSTECTOMY N/A 05/01/2015    Procedure: LAPAROSCOPIC CHOLECYSTECTOMY WITH ATTEMPTED INTRAOPERATIVE CHOLANGIOGRAM;  Surgeon: Dann Hummer, MD;  Location: [facility] OR;  Service: General;  Laterality: N/A;   HIP DISARTICULATION Right 05/01/2023   Procedure: RIGHT HIP DISARTICULATION;  Surgeon: Harden Jerona GAILS, MD;  Location: [facility] OR;  Service: Orthopedics;  Laterality: Right;   IR US  GUIDE BX ASP/DRAIN  04/27/2023   SPINE SURGERY     TEE WITHOUT CARDIOVERSION N/A 03/26/2022   Procedure: TRANSESOPHAGEAL ECHOCARDIOGRAM (TEE);  Surgeon: Alveta Aleene PARAS, MD;  Location: [facility] ENDOSCOPY;  Service: Cardiovascular;  Laterality: N/A;   WOUND EXPLORATION Right 05/01/2023   Procedure: WOUND EXPLORATION OF HIP;  Surgeon: Harden Jerona GAILS, MD;  Location: [facility] OR;  Service: Orthopedics;  Laterality: Right;    Family History  Problem Relation Age of Onset   Healthy Mother    Diabetes Father    Diabetes Sister     Social History   Socioeconomic History   Marital status: Married    Spouse name: Not on file   Number of children: Not on file   Years of education: Not on file   Highest education level: Not on file  Occupational History   Occupation: disabled  Tobacco Use   Smoking status: Former    Current packs/day: 0.00    Average packs/day: 1 pack/day for 5.0 years (5.0 ttl pk-yrs)    Types: Cigarettes    Start date: 10/12/2006    Quit date: 10/13/2011    Years since quitting: 12.9   Smokeless tobacco: Never   Tobacco  comments:    03-24-19 per pt he stopped 1 mo ago   Vaping Use   Vaping status: Never Used  Substance and Sexual Activity   Alcohol use: Not Currently    Alcohol/week: 1.0 standard drink of alcohol    Types: 1 Cans of beer per week   Drug use: Never   Sexual activity: Not on file  Other Topics Concern   Not on file  Social History Narrative   Not on file   Social Drivers of Health   Financial Resource Strain: Low Risk  (08/26/2023)   Overall Financial Resource Strain (CARDIA)    Difficulty of Paying Living Expenses:  Not hard at all  Food Insecurity: No Food Insecurity (08/26/2023)   Hunger Vital Sign    Worried About Running Out of Food in the Last Year: Never true    Ran Out of Food in the Last Year: Never true  Transportation Needs: No Transportation Needs (08/26/2023)   PRAPARE - Administrator, Civil Service (Medical): No    Lack of Transportation (Non-Medical): No  Physical Activity: Inactive (08/26/2023)   Exercise Vital Sign    Days of Exercise per Week: 0 days    Minutes of Exercise per Session: 0 min  Stress: No Stress Concern Present (08/26/2023)   [employer] of Occupational Health - Occupational Stress Questionnaire    Feeling of Stress : Only a little  Social Connections: Moderately Integrated (08/26/2023)   Social Connection and Isolation Panel    Frequency of Communication with Friends and Family: Three times a week    Frequency of Social Gatherings with Friends and Family: Twice a week    Attends Religious Services: Never    Database Administrator or Organizations: Yes    Attends Engineer, Structural: More than 4 times per year    Marital Status: Married    Allergies  Allergen Reactions   Macrobid  [Nitrofurantoin ] Itching and Rash    Outpatient Medications Prior to Visit  Medication Sig Dispense Refill   JANUVIA  100 MG tablet Take 1 tablet (100 mg total) by mouth daily. 90 tablet 1   valsartan -hydrochlorothiazide  (DIOVAN -HCT) 320-25 MG tablet Take 1 tablet by mouth daily. 90 tablet 1   atorvastatin  (LIPITOR) 20 MG tablet Take 1 tablet (20 mg total) by mouth daily at 12 noon. 90 tablet 1   metFORMIN  (GLUCOPHAGE ) 1000 MG tablet Take 1 tablet (1,000 mg total) by mouth 2 (two) times daily with a meal. 180 tablet 1   ondansetron  (ZOFRAN ) 4 MG tablet Take 1 tablet (4 mg total) by mouth every 8 (eight) hours as needed for nausea or vomiting. 30 tablet 1   pantoprazole  (PROTONIX ) 40 MG tablet Take 1 tablet (40 mg total) by mouth at bedtime. 90 tablet 1    ferrous sulfate  (FEROSUL) 325 (65 FE) MG tablet Take 1 tablet (325 mg total) by mouth daily. (Patient not taking: Reported on 09/04/2024) 90 tablet 1   cephALEXin  (KEFLEX ) 500 MG capsule Take 1 capsule (500 mg total) by mouth 2 (two) times daily. (Patient not taking: Reported on 09/04/2024) 14 capsule 0   ergocalciferol  (DRISDOL ) 1.25 MG (50000 UT) capsule Take 1 capsule (50,000 Units total) by mouth once a week. (Patient not taking: Reported on 09/04/2024) 12 capsule 1   No facility-administered medications prior to visit.     ROS Review of Systems  Constitutional:  Negative for activity change and appetite change.  HENT:  Negative for sinus pressure and sore throat.  Respiratory:  Negative for chest tightness, shortness of breath and wheezing.   Cardiovascular:  Negative for chest pain and palpitations.  Gastrointestinal:  Negative for abdominal distention, abdominal pain and constipation.  Genitourinary: Negative.   Musculoskeletal: Negative.   Psychiatric/Behavioral:  Negative for behavioral problems and dysphoric mood.     Objective:  BP (!) 155/77   Pulse 76   Temp 98 F (36.7 C) (Oral)   Ht 5' 4 (1.626 m)   SpO2 100%   BMI 27.40 kg/m      09/04/2024   10:16 AM 09/04/2024    9:31 AM 03/17/2024    9:07 PM  BP/Weight  Systolic BP 155 143 142  Diastolic BP 77 75 85  Wt. (Lbs)  --       Physical Exam Constitutional:      Appearance: He is well-developed.  Cardiovascular:     Rate and Rhythm: Normal rate.     Heart sounds: Normal heart sounds. No murmur heard. Pulmonary:     Effort: Pulmonary effort is normal.     Breath sounds: Normal breath sounds. No wheezing or rales.  Chest:     Chest wall: No tenderness.  Abdominal:     General: Bowel sounds are normal. There is no distension.     Palpations: Abdomen is soft. There is no mass.     Tenderness: There is no abdominal tenderness.  Musculoskeletal:     Left lower leg: Edema (1+) present.     Comments:  Right AKA  Neurological:     Mental Status: He is alert and oriented to person, place, and time.  Psychiatric:        Mood and Affect: Mood normal.        Latest Ref Rng & Units 03/17/2024    3:53 PM 12/01/2023   11:35 AM 08/27/2023   11:02 AM  CMP  Glucose 70 - 99 mg/dL 895  879    BUN 6 - 20 mg/dL 21  22    Creatinine 9.38 - 1.24 mg/dL 8.89  9.04    Sodium 864 - 145 mmol/L 136  138    Potassium 3.5 - 5.1 mmol/L 4.7  4.9    Chloride 98 - 111 mmol/L 108  103    CO2 22 - 32 mmol/L 22  21    Calcium  8.9 - 10.3 mg/dL 8.8  9.1    Total Protein 6.5 - 8.1 g/dL 6.8  6.7  7.1   Total Bilirubin 0.0 - 1.2 mg/dL 0.5  0.2  0.3   Alkaline Phos 38 - 126 U/L 75  118  148   AST 15 - 41 U/L 17  11  16    ALT 0 - 44 U/L 20  12  18      Lipid Panel     Component Value Date/Time   CHOL 122 05/27/2023 0938   TRIG 123 05/27/2023 0938   HDL 44 05/27/2023 0938   CHOLHDL 2.8 05/27/2023 0938   CHOLHDL 11.5 05/05/2015 0352   VLDL 35 05/05/2015 0352   LDLCALC 56 05/27/2023 0938    CBC    Component Value Date/Time   WBC 5.7 03/17/2024 1553   RBC 3.64 (L) 03/17/2024 1553   HGB 11.4 (L) 03/17/2024 1553   HGB 11.0 (L) 03/15/2024 0959   HGB 11.9 (L) 03/02/2024 1102   HCT 34.0 (L) 03/17/2024 1553   HCT 36.3 (L) 03/02/2024 1102   PLT 170 03/17/2024 1553   PLT 181 03/15/2024 0959   PLT 196  03/02/2024 1102   MCV 93.4 03/17/2024 1553   MCV 94 03/02/2024 1102   MCH 31.3 03/17/2024 1553   MCHC 33.5 03/17/2024 1553   RDW 13.3 03/17/2024 1553   RDW 13.4 03/02/2024 1102   LYMPHSABS 1.8 03/17/2024 1553   LYMPHSABS 1.9 03/02/2024 1102   MONOABS 0.5 03/17/2024 1553   EOSABS 0.1 03/17/2024 1553   EOSABS 0.1 03/02/2024 1102   BASOSABS 0.0 03/17/2024 1553   BASOSABS 0.0 03/02/2024 1102    Lab Results  Component Value Date   HGBA1C 6.6 09/04/2024    Lab Results  Component Value Date   HGBA1C 6.6 09/04/2024   HGBA1C 6.5 03/02/2024   HGBA1C 7.4 (A) 12/01/2023       Assessment & Plan Type 2  diabetes mellitus with other specified complication Diabetes is well controlled with an A1c of 6.6%. - Continue metformin  1000 mg orally twice daily. - Continue Januvia  100 mg orally daily. -Counseled on Diabetic diet, the healthy plate, 849 minutes of moderate intensity exercise/week Blood sugar logs with fasting goals of 80-120 mg/dl, random of less than 819 and in the event of sugars less than 60 mg/dl or greater than 599 mg/dl encouraged to notify the clinic. Advised on the need for annual eye exams, annual foot exams, Pneumonia vaccine.   Essential hypertension Blood pressure is slightly elevated and upon repeat blood pressure is still elevated slightly - Added low dose amlodipine  to the regimen. - Advised on a low sodium diet. -Counseled on blood pressure goal of less than 130/80, low-sodium, DASH diet, medication compliance, 150 minutes of moderate intensity exercise per week. Discussed medication compliance, adverse effects.    Localized edema, left ankle Slight swelling in the left ankle, likely due to inability to elevate the leg. - Advised family to assist in elevating the left leg when sitting.  Screening for malignant neoplasm of colon Due for colon cancer screening. - Provided stool test kit for colon cancer screening.     Healthcare maintenance Need for immunization against influenza-flu shot administered  Meds ordered this encounter  Medications   atorvastatin  (LIPITOR) 20 MG tablet    Sig: Take 1 tablet (20 mg total) by mouth daily at 12 noon.    Dispense:  90 tablet    Refill:  1   metFORMIN  (GLUCOPHAGE ) 1000 MG tablet    Sig: Take 1 tablet (1,000 mg total) by mouth 2 (two) times daily with a meal.    Dispense:  180 tablet    Refill:  1   ondansetron  (ZOFRAN ) 4 MG tablet    Sig: Take 1 tablet (4 mg total) by mouth every 8 (eight) hours as needed for nausea or vomiting.    Dispense:  30 tablet    Refill:  1   pantoprazole  (PROTONIX ) 40 MG tablet    Sig:  Take 1 tablet (40 mg total) by mouth at bedtime.    Dispense:  90 tablet    Refill:  1   amLODipine  (NORVASC ) 2.5 MG tablet    Sig: Take 1 tablet (2.5 mg total) by mouth daily.    Dispense:  90 tablet    Refill:  1    Follow-up: Return in about 6 months (around 03/04/2025) for Chronic medical conditions.       Corrina Sabin, MD, FAAFP. [facility] [place], [place] [phone]   09/04/2024, 2:52 PM

## 2024-09-04 NOTE — Patient Instructions (Signed)
 Edema Edema  El edema es una acumulacin anormal de lquido en los tejidos del cuerpo y debajo de la piel. La hinchazn de las piernas, los pies y los tobillos es un sntoma frecuente que se hace ms probable a medida que envejece. La hinchazn tambin es frecuente en los tejidos ms sueltos, como en la zona que rodea los ojos. La presin sobre la zona puede dejar una marca temporal en la piel (edema con fvea). Este lquido tambin puede acumularse en los pulmones (edema pulmonar). Hay muchas causas posibles de edema. Consumir grandes cantidades de sal (sodio) y estar de pie o sentado durante mucho tiempo puede causar edema en las piernas, los pies y los tobillos. Entre las causas frecuentes de edema se incluyen las siguientes: Ciertas enfermedades, como insuficiencia cardaca, enfermedad renal o heptica, o cncer. Debilidad de los vasos sanguneos de las piernas. Una lesin. Embarazo. Medicamentos. Obesidad. Bajos niveles de protenas en la sangre. Cuando hace calor, el edema puede empeorar. Generalmente, el edema es indoloro. La piel puede parecer hinchada o tener un aspecto brilloso. Siga estas instrucciones en su casa: Medicamentos Use los medicamentos de venta libre y los recetados solamente como se lo haya indicado el mdico. El mdico puede recetarle un medicamento para ayudar a que el cuerpo elimine el exceso de agua (diurtico). Tome este medicamento si se lo indican. Comida y bebida Siga una dieta con poco contenido de sal (sodio) para reducir el lquido segn las indicaciones del mdico. A veces, disminuir el consumo de sal puede reducir la hinchazn. Segn la causa de su hinchazn, es posible que deba limitar la cantidad de lquido que bebe (restriccin de lquido). Instrucciones generales Cuando est sentado o acostado, levante (eleve) la zona lesionada por encima del nivel del corazn. No permanezca quieto sentado o de pie durante largos perodos. No use ropa ajustada. No use  ligas en la parte superior de las piernas. Ejercite las piernas para Publishing rights manager. Esto ayuda a que el lquido pase nuevamente a sus vasos sanguneos, y puede ayudar a Building services engineer. Use medias de compresin como se lo haya indicado su mdico. Estas medias ayudan a evitar la formacin de cogulos de sangre y a reducir la hinchazn de las piernas. Es importante que sean del tamao correcto. Estas medias deben ser prescritas por su mdico para evitar posibles lesiones. Si le recomiendan vendajes, selos como se lo haya indicado el mdico. Comunquese con un mdico si: El edema no mejora con el tratamiento. Tiene enfermedades cardacas, hepticas o renales, y observa sntomas de edema. Aumenta de peso de [place] repentina y sin motivo aparente. Solicite ayuda de inmediato si: Siente falta de aire o dolor en el pecho. No puede respirar cuando se acuesta. Tiene dolor, enrojecimiento o calor en las zonas hinchadas. Tiene enfermedades cardacas, hepticas o renales y repentinamente tiene edema. Tiene fiebre y los sntomas empeoran repentinamente. Estos sntomas pueden Customer service manager. Solicite ayuda de inmediato. Llame al 911. No espere a ver si los sntomas desaparecen. No conduzca por sus propios medios [employer]. Resumen El edema es una acumulacin anormal de lquido en los tejidos del cuerpo y debajo de la piel. Consumir grandes cantidades de sal (sodio)y estar de pie o sentado durante mucho tiempo puede causar edema en las piernas, los pies y los tobillos. Cuando est sentado o acostado, levante (eleve) la zona lesionada por encima del nivel del corazn. Siga las instrucciones del mdico con respecto a la dieta y a la cantidad de lquido que  puede beber. Esta informacin no tiene Theme park manager el consejo del mdico. Asegrese de hacerle al mdico cualquier pregunta que tenga. Document Revised: 06/23/2021 Document Reviewed: 06/23/2021 Elsevier Patient  Education  2024 [employer].

## 2024-09-05 LAB — FECAL OCCULT BLOOD, IMMUNOCHEMICAL: Fecal Occult Bld: NEGATIVE

## 2024-09-06 ENCOUNTER — Other Ambulatory Visit: Payer: Self-pay

## 2024-09-06 ENCOUNTER — Ambulatory Visit: Payer: Self-pay | Admitting: Family Medicine

## 2024-09-06 DIAGNOSIS — E1169 Type 2 diabetes mellitus with other specified complication: Secondary | ICD-10-CM

## 2024-09-06 LAB — CMP14+EGFR
ALT: 14 IU/L (ref 0–44)
AST: 14 IU/L (ref 0–40)
Albumin: 3.9 g/dL (ref 3.9–4.9)
Alkaline Phosphatase: 112 IU/L (ref 47–123)
BUN/Creatinine Ratio: 34 — ABNORMAL HIGH (ref 10–24)
BUN: 31 mg/dL — ABNORMAL HIGH (ref 8–27)
Bilirubin Total: 0.3 mg/dL (ref 0.0–1.2)
CO2: 20 mmol/L (ref 20–29)
Calcium: 9.5 mg/dL (ref 8.6–10.2)
Chloride: 101 mmol/L (ref 96–106)
Creatinine, Ser: 0.91 mg/dL (ref 0.76–1.27)
Globulin, Total: 2.6 g/dL (ref 1.5–4.5)
Glucose: 100 mg/dL — ABNORMAL HIGH (ref 70–99)
Potassium: 4.7 mmol/L (ref 3.5–5.2)
Sodium: 137 mmol/L (ref 134–144)
Total Protein: 6.5 g/dL (ref 6.0–8.5)
eGFR: 96 mL/min/1.73 (ref >59)

## 2024-09-06 LAB — LP+NON-HDL CHOLESTEROL
Cholesterol, Total: 149 mg/dL (ref 100–199)
HDL: 42 mg/dL (ref >39)
LDL Chol Calc (NIH): 80 mg/dL (ref 0–99)
Total Non-HDL-Chol (LDL+VLDL): 107 mg/dL (ref 0–129)
Triglycerides: 155 mg/dL — ABNORMAL HIGH (ref 0–149)
VLDL Cholesterol Cal: 27 mg/dL (ref 5–40)

## 2024-09-06 LAB — MICROALBUMIN / CREATININE URINE RATIO
Creatinine, Urine: 33.7 mg/dL
Microalb/Creat Ratio: 5286 mg/g{creat} — AB (ref 0–29)
Microalbumin, Urine: 1781.5 ug/mL

## 2024-09-06 MED ORDER — DAPAGLIFLOZIN PROPANEDIOL 10 MG PO TABS
10.0000 mg | ORAL_TABLET | Freq: Every day | ORAL | 1 refills | Status: AC
  Filled 2024-09-06 – 2024-09-15 (×2): qty 90, 90d supply, fill #0
  Filled 2024-09-15: qty 30, 30d supply, fill #0

## 2024-09-11 ENCOUNTER — Other Ambulatory Visit: Payer: Self-pay

## 2024-09-14 ENCOUNTER — Other Ambulatory Visit: Payer: Self-pay

## 2024-09-15 ENCOUNTER — Other Ambulatory Visit: Payer: Self-pay

## 2024-09-15 ENCOUNTER — Encounter: Payer: Self-pay | Admitting: Hematology

## 2024-09-18 ENCOUNTER — Other Ambulatory Visit: Payer: Self-pay

## 2024-09-22 ENCOUNTER — Other Ambulatory Visit: Payer: Self-pay

## 2024-09-25 ENCOUNTER — Other Ambulatory Visit: Payer: Self-pay

## 2024-09-26 ENCOUNTER — Other Ambulatory Visit: Payer: Self-pay

## 2024-10-17 ENCOUNTER — Other Ambulatory Visit: Payer: Self-pay

## 2024-10-31 ENCOUNTER — Encounter: Payer: Self-pay | Admitting: Hematology

## 2024-10-31 ENCOUNTER — Other Ambulatory Visit: Payer: Self-pay

## 2024-11-01 ENCOUNTER — Other Ambulatory Visit: Payer: Self-pay

## 2024-11-10 ENCOUNTER — Emergency Department (HOSPITAL_COMMUNITY): Payer: Self-pay

## 2024-11-10 ENCOUNTER — Other Ambulatory Visit: Payer: Self-pay

## 2024-11-10 ENCOUNTER — Ambulatory Visit: Payer: Self-pay

## 2024-11-10 ENCOUNTER — Inpatient Hospital Stay (HOSPITAL_COMMUNITY)
Admission: EM | Admit: 2024-11-10 | Discharge: 2024-11-12 | DRG: 699 | Disposition: A | Payer: Self-pay | Attending: Student | Admitting: Student

## 2024-11-10 DIAGNOSIS — E1169 Type 2 diabetes mellitus with other specified complication: Secondary | ICD-10-CM | POA: Diagnosis present

## 2024-11-10 DIAGNOSIS — R310 Gross hematuria: Principal | ICD-10-CM

## 2024-11-10 DIAGNOSIS — Z87891 Personal history of nicotine dependence: Secondary | ICD-10-CM

## 2024-11-10 DIAGNOSIS — N319 Neuromuscular dysfunction of bladder, unspecified: Secondary | ICD-10-CM | POA: Diagnosis present

## 2024-11-10 DIAGNOSIS — Z833 Family history of diabetes mellitus: Secondary | ICD-10-CM

## 2024-11-10 DIAGNOSIS — I10 Essential (primary) hypertension: Secondary | ICD-10-CM | POA: Diagnosis present

## 2024-11-10 DIAGNOSIS — N400 Enlarged prostate without lower urinary tract symptoms: Secondary | ICD-10-CM | POA: Diagnosis present

## 2024-11-10 DIAGNOSIS — Z881 Allergy status to other antibiotic agents status: Secondary | ICD-10-CM

## 2024-11-10 DIAGNOSIS — D638 Anemia in other chronic diseases classified elsewhere: Secondary | ICD-10-CM | POA: Diagnosis present

## 2024-11-10 DIAGNOSIS — Z7984 Long term (current) use of oral hypoglycemic drugs: Secondary | ICD-10-CM

## 2024-11-10 DIAGNOSIS — Y731 Therapeutic (nonsurgical) and rehabilitative gastroenterology and urology devices associated with adverse incidents: Secondary | ICD-10-CM | POA: Diagnosis present

## 2024-11-10 DIAGNOSIS — E860 Dehydration: Secondary | ICD-10-CM | POA: Diagnosis present

## 2024-11-10 DIAGNOSIS — E872 Acidosis, unspecified: Secondary | ICD-10-CM | POA: Diagnosis present

## 2024-11-10 DIAGNOSIS — R6510 Systemic inflammatory response syndrome (SIRS) of non-infectious origin without acute organ dysfunction: Secondary | ICD-10-CM | POA: Diagnosis present

## 2024-11-10 DIAGNOSIS — N179 Acute kidney failure, unspecified: Secondary | ICD-10-CM | POA: Diagnosis present

## 2024-11-10 DIAGNOSIS — Z79899 Other long term (current) drug therapy: Secondary | ICD-10-CM

## 2024-11-10 DIAGNOSIS — K219 Gastro-esophageal reflux disease without esophagitis: Secondary | ICD-10-CM | POA: Diagnosis present

## 2024-11-10 DIAGNOSIS — E119 Type 2 diabetes mellitus without complications: Secondary | ICD-10-CM

## 2024-11-10 DIAGNOSIS — K592 Neurogenic bowel, not elsewhere classified: Secondary | ICD-10-CM | POA: Diagnosis present

## 2024-11-10 DIAGNOSIS — T8383XA Hemorrhage of genitourinary prosthetic devices, implants and grafts, initial encounter: Principal | ICD-10-CM | POA: Diagnosis present

## 2024-11-10 DIAGNOSIS — I959 Hypotension, unspecified: Secondary | ICD-10-CM | POA: Diagnosis present

## 2024-11-10 DIAGNOSIS — E7849 Other hyperlipidemia: Secondary | ICD-10-CM | POA: Diagnosis present

## 2024-11-10 DIAGNOSIS — G8220 Paraplegia, unspecified: Secondary | ICD-10-CM | POA: Diagnosis present

## 2024-11-10 DIAGNOSIS — N136 Pyonephrosis: Secondary | ICD-10-CM | POA: Diagnosis present

## 2024-11-10 DIAGNOSIS — Z603 Acculturation difficulty: Secondary | ICD-10-CM | POA: Diagnosis present

## 2024-11-10 LAB — BASIC METABOLIC PANEL WITH GFR
Anion gap: 12 (ref 5–15)
BUN: 34 mg/dL — ABNORMAL HIGH (ref 8–23)
CO2: 24 mmol/L (ref 22–32)
Calcium: 9.4 mg/dL (ref 8.9–10.3)
Chloride: 102 mmol/L (ref 98–111)
Creatinine, Ser: 1.15 mg/dL (ref 0.61–1.24)
GFR, Estimated: >60 mL/min
Glucose, Bld: 196 mg/dL — ABNORMAL HIGH (ref 70–99)
Potassium: 4.9 mmol/L (ref 3.5–5.1)
Sodium: 138 mmol/L (ref 135–145)

## 2024-11-10 LAB — CBC WITH DIFFERENTIAL/PLATELET
Abs Immature Granulocytes: 0.02 10*3/uL (ref 0.00–0.07)
Basophils Absolute: 0.1 10*3/uL (ref 0.0–0.1)
Basophils Relative: 1 %
Eosinophils Absolute: 0.7 10*3/uL — ABNORMAL HIGH (ref 0.0–0.5)
Eosinophils Relative: 7 %
HCT: 37.8 % — ABNORMAL LOW (ref 39.0–52.0)
Hemoglobin: 12.4 g/dL — ABNORMAL LOW (ref 13.0–17.0)
Immature Granulocytes: 0 %
Lymphocytes Relative: 20 %
Lymphs Abs: 1.9 10*3/uL (ref 0.7–4.0)
MCH: 30.2 pg (ref 26.0–34.0)
MCHC: 32.8 g/dL (ref 30.0–36.0)
MCV: 92 fL (ref 80.0–100.0)
Monocytes Absolute: 0.8 10*3/uL (ref 0.1–1.0)
Monocytes Relative: 8 %
Neutro Abs: 6.4 10*3/uL (ref 1.7–7.7)
Neutrophils Relative %: 64 %
Platelets: 247 10*3/uL (ref 150–400)
RBC: 4.11 MIL/uL — ABNORMAL LOW (ref 4.22–5.81)
RDW: 13.3 % (ref 11.5–15.5)
WBC: 9.8 10*3/uL (ref 4.0–10.5)
nRBC: 0 % (ref 0.0–0.2)

## 2024-11-10 MED ORDER — SODIUM CHLORIDE 0.9 % IV BOLUS
1000.0000 mL | Freq: Once | INTRAVENOUS | Status: AC
  Administered 2024-11-10: 1000 mL via INTRAVENOUS

## 2024-11-10 NOTE — Telephone Encounter (Signed)
 FYI

## 2024-11-10 NOTE — ED Triage Notes (Signed)
 Pt reports blood in urine for 4 days, denies any pain.

## 2024-11-10 NOTE — ED Triage Notes (Signed)
 Patient arrives POV for blood in urine x 4 days. Patient denies any pain. No nausea or vomiting. No changes in frequency. Patient has no other complaints.

## 2024-11-10 NOTE — Telephone Encounter (Signed)
 FYI Only or Action Required?: Action required by provider: request for appointment and update on patient condition.  Patient was last seen in primary care on 09/04/2024 by Newlin, Enobong, MD.  Called Nurse Triage reporting Urine Output.  Symptoms began 4 days ago.  Interventions attempted: Rest, hydration, or home remedies.  Symptoms are: gradually improving.  Triage Disposition: Call PCP Now (overriding Home Care)  Patient/caregiver understands and will follow disposition?: Yes                  Message from Hadassah PARAS sent at 11/10/2024 11:52 AM EST  Reason for Triage: Pt's wife Hadassah is calling to advise pt has brown coloration in urine, possibly blood. Pt is paralyzed from waist down so wife cannot know if pt is in pain. Transferring to NT  Toribio (interpreter) on the line   Reason for Disposition  Tea-colored or red-tinged urine, present < 24 hours  Answer Assessment - Initial Assessment Questions 1. MAIN CONCERN OR SYMPTOM:  What is your main concern right now? (e.g., blood in urine, urine blocked, can't cath).     Brown colored urine with looked like blood clots/ small pieces of I don't know what  2. ONSET: When did the  darker colored urine  start?     4 days ago. Urine clears up in color when drinking more water .  3. BLADDER MANAGEMENT PROGRAM: What is your bladder management program?     In and out catheter.   4. FEVER: Do you have a fever? If Yes, ask: What is the temperature, how was it measured, and when did it start?     No.  5. URINE COLOR: What color is the urine?  Is there blood present in the urine? (e.g., clear, yellow, cloudy, tea-colored, blood streaks, bright red)     Dark brown.  6. ONSET: When was the catheter inserted (last changed)?     Was doing in and out catheter stopped 4 days ago when noticed urine brown, diaper.  7. OTHER SYMPTOMS: Do you have any other symptoms? (e.g., fever, back pain, bad urine odor,  headache, sweating, goosebumps)     No chills, fever, pain, abdominal swelling. Estimates patient drinks about 1.5L of fluids daily.  Protocols used: Spinal Cord Injury - Urinary Catheter Symptoms and Questions-A-AH

## 2024-11-10 NOTE — ED Provider Notes (Signed)
 " [facility] Provider Note   CSN: [id number] Arrival date & time: 11/10/24  1411     Patient presents with: Hematuria   John Meza is a 64 y.o. male.  {Add pertinent medical, surgical, social history, OB history to YEP:67052} Patient to ED with wife reporting onset gross hematuria this afternoon. He denies feeling unwell. No fever, vomiting, known injury. 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 He self catheterizes in the morning and evening. Per wife, he performed a cath this morning but bleeding started prior to evening procedure. Not anticoagulated.   The history is provided by the patient and the spouse. A language interpreter was used.  Hematuria       Prior to Admission medications  Medication Sig Start Date End Date Taking? Authorizing Provider  amLODipine  (NORVASC ) 2.5 MG tablet Take 1 tablet (2.5 mg total) by mouth daily. 09/04/24   Newlin, Enobong, MD  atorvastatin  (LIPITOR) 20 MG tablet Take 1 tablet (20 mg total) by mouth daily at 12 noon. 09/04/24   Newlin, Enobong, MD  dapagliflozin  propanediol (FARXIGA ) 10 MG TABS tablet Take 1 tablet (10 mg total) by mouth daily. 09/06/24   Newlin, Enobong, MD  ferrous sulfate  (FEROSUL) 325 (65 FE) MG tablet Take 1 tablet (325 mg total) by mouth daily. Patient not taking: Reported on 09/04/2024 05/03/24   Newlin, Enobong, MD  JANUVIA  100 MG tablet Take 1 tablet (100 mg total) by mouth daily. 03/02/24   Newlin, Enobong, MD  metFORMIN  (GLUCOPHAGE ) 1000 MG tablet Take 1 tablet (1,000 mg total) by mouth 2 (two) times daily with a meal. 09/04/24   Delbert Clam, MD  ondansetron  (ZOFRAN ) 4 MG tablet Take 1 tablet (4 mg total) by mouth every 8 (eight) hours as needed for nausea or vomiting. 09/04/24   Newlin, Enobong, MD  pantoprazole  (PROTONIX ) 40 MG tablet Take 1 tablet (40 mg total) by mouth at bedtime. 09/04/24   Newlin, Enobong, MD  valsartan -hydrochlorothiazide   (DIOVAN -HCT) 320-25 MG tablet Take 1 tablet by mouth daily. 03/02/24   Newlin, Enobong, MD  loratadine  (CLARITIN ) 10 MG tablet Take 1 tablet (10 mg total) by mouth daily. As needed for itchy throat/allergy symptoms Patient not taking: Reported on 03/24/2019 12/26/18 07/05/20  Alec House, MD    Allergies: Macrobid  [nitrofurantoin ]    Review of Systems  Genitourinary:  Positive for hematuria.    Updated Vital Signs BP (!) 70/58   Pulse (!) 123   Temp 98.6 F (37 C) (Oral)   Resp 19   Ht 5' 4 (1.626 m)   SpO2 100%   BMI 27.40 kg/m   Physical Exam Vitals reviewed.  Constitutional:      Appearance: Normal appearance. He is well-developed. He is not ill-appearing or diaphoretic.  HENT:     Head: Normocephalic.  Cardiovascular:     Rate and Rhythm: Regular rhythm. Tachycardia present.  Pulmonary:     Effort: Pulmonary effort is normal.     Breath sounds: Normal breath sounds. No wheezing, rhonchi or rales.  Abdominal:     General: There is no distension.     Palpations: Abdomen is soft.     Tenderness: There is no abdominal tenderness. There is no guarding or rebound.  Genitourinary:    Comments: Actively bleeding from urethral meatus.  Musculoskeletal:        General: Normal range of motion.     Cervical back: Normal range of motion and neck supple.  Skin:    General:  Skin is warm and dry.  Neurological:     General: No focal deficit present.     Mental Status: He is alert and oriented to person, place, and time.     (all labs ordered are listed, but only abnormal results are displayed) Labs Reviewed  CBC WITH DIFFERENTIAL/PLATELET - Abnormal; Notable for the following components:      Result Value   RBC 4.11 (*)    Hemoglobin 12.4 (*)    HCT 37.8 (*)    Eosinophils Absolute 0.7 (*)    All other components within normal limits  BASIC METABOLIC PANEL WITH GFR - Abnormal; Notable for the following components:   Glucose, Bld 196 (*)    BUN 34 (*)    All other  components within normal limits  URINALYSIS, ROUTINE W REFLEX MICROSCOPIC  HEMOGLOBIN AND HEMATOCRIT, BLOOD  PROTIME-INR  TYPE AND SCREEN    EKG: None  Radiology: No results found.  {Document cardiac monitor, telemetry assessment procedure when appropriate:32947} Procedures   Medications Ordered in the ED  sodium chloride  0.9 % bolus 1,000 mL (1,000 mLs Intravenous New Bag/Given 11/10/24 2302)    Clinical Course as of 11/10/24 2328  Fri Nov 10, 2024  2306 64 yo male who self catheterize presenting from home with concern for gross hematuria.  Reports this began this evening.  Patient is not on anticoagulation.  Here in the ED initial blood pressure and heart rate were within normal limits at triage, hemoglobin in triage was 12.4.  However, when the patient was roomed a few hours later, he has now noted to have tachycardia and hypotension.  We are requesting an urgent repeat hemoglobin level and of started IV fluids.  Current blood pressure at this time 98/70 mmhg the patient is not have any lightheadedness in the room.  MAP is over 65.  Will need close monitoring, hemoglobin check, transfusion if there is concern for anemia as an underlying cause.  Consideration of CT imaging as well, although my suspicion for bladder perforation is fairly low as he does not have significant tenderness on suprapubic exam. [MT]    Clinical Course User Index [MT] Trifan, Donnice PARAS, MD   {Click here for ABCD2, HEART and other calculators REFRESH Note before signing:1}                              Medical Decision Making This patient presents to the ED for concern of gross hematuria, this involves an extensive number of treatment options, and is a complaint that carries with it a high risk of complications and morbidity.  The differential diagnosis includes bladder perforation/injury, bladder CA, pyelonephritis, UTI   Co morbidities / Chronic conditions that complicate the patient evaluation  Paraplegic  (self caths), NIDDM   Additional history obtained:  Additional history obtained from EMR External records from outside source obtained and reviewed including ***   Lab Tests:  I Ordered, and personally interpreted labs.  The pertinent results include:  ***    Imaging Studies ordered:  I ordered imaging studies including ***  I independently visualized and interpreted imaging which showed *** I agree with the radiologist interpretation   Cardiac Monitoring: / EKG:  The patient was maintained on a cardiac monitor.  I personally viewed and interpreted the cardiac monitored which showed an underlying rhythm of: ***   Problem List / ED Course / Critical interventions / Medication management  *** I ordered medication including ***  Reevaluation of the patient after these medicines showed that the patient *** I have reviewed the patients home medicines and have made adjustments as needed   Consultations Obtained:  I requested consultation with the ***,  and discussed lab and imaging findings as well as pertinent plan - they recommend: ***   Social Determinants of Health:  ***   Test / Admission - Considered:  ***    Amount and/or Complexity of Data Reviewed Labs: ordered.   ***  {Document critical care time when appropriate  Document review of labs and clinical decision tools ie CHADS2VASC2, etc  Document your independent review of radiology images and any outside records  Document your discussion with family members, caretakers and with consultants  Document social determinants of health affecting pt's care  Document your decision making why or why not admission, treatments were needed:32947:::1}   Final diagnoses:  None    ED Discharge Orders     None        "

## 2024-11-10 NOTE — ED Notes (Signed)
 Charge nurse notified on patient's condition and abnormal vital signs /room assignment .

## 2024-11-11 ENCOUNTER — Encounter (HOSPITAL_COMMUNITY): Payer: Self-pay | Admitting: Student

## 2024-11-11 ENCOUNTER — Emergency Department (HOSPITAL_COMMUNITY): Payer: Self-pay

## 2024-11-11 DIAGNOSIS — N319 Neuromuscular dysfunction of bladder, unspecified: Secondary | ICD-10-CM

## 2024-11-11 DIAGNOSIS — G8220 Paraplegia, unspecified: Secondary | ICD-10-CM

## 2024-11-11 DIAGNOSIS — I959 Hypotension, unspecified: Secondary | ICD-10-CM

## 2024-11-11 DIAGNOSIS — R310 Gross hematuria: Principal | ICD-10-CM

## 2024-11-11 LAB — CBC WITH DIFFERENTIAL/PLATELET
Abs Immature Granulocytes: 0.13 10*3/uL — ABNORMAL HIGH (ref 0.00–0.07)
Basophils Absolute: 0.1 10*3/uL (ref 0.0–0.1)
Basophils Relative: 0 %
Eosinophils Absolute: 0.1 10*3/uL (ref 0.0–0.5)
Eosinophils Relative: 0 %
HCT: 29.8 % — ABNORMAL LOW (ref 39.0–52.0)
Hemoglobin: 10.1 g/dL — ABNORMAL LOW (ref 13.0–17.0)
Immature Granulocytes: 1 %
Lymphocytes Relative: 5 %
Lymphs Abs: 0.8 10*3/uL (ref 0.7–4.0)
MCH: 30.3 pg (ref 26.0–34.0)
MCHC: 33.9 g/dL (ref 30.0–36.0)
MCV: 89.5 fL (ref 80.0–100.0)
Monocytes Absolute: 0.9 10*3/uL (ref 0.1–1.0)
Monocytes Relative: 6 %
Neutro Abs: 14.7 10*3/uL — ABNORMAL HIGH (ref 1.7–7.7)
Neutrophils Relative %: 88 %
Platelets: 189 10*3/uL (ref 150–400)
RBC: 3.33 MIL/uL — ABNORMAL LOW (ref 4.22–5.81)
RDW: 13.6 % (ref 11.5–15.5)
WBC: 16.6 10*3/uL — ABNORMAL HIGH (ref 4.0–10.5)
nRBC: 0.1 % (ref 0.0–0.2)

## 2024-11-11 LAB — URINALYSIS, ROUTINE W REFLEX MICROSCOPIC: RBC / HPF: >50 RBC/hpf (ref 0–5)

## 2024-11-11 LAB — BASIC METABOLIC PANEL WITH GFR
Anion gap: 13 (ref 5–15)
BUN: 42 mg/dL — ABNORMAL HIGH (ref 8–23)
CO2: 19 mmol/L — ABNORMAL LOW (ref 22–32)
Calcium: 8 mg/dL — ABNORMAL LOW (ref 8.9–10.3)
Chloride: 104 mmol/L (ref 98–111)
Creatinine, Ser: 1.64 mg/dL — ABNORMAL HIGH (ref 0.61–1.24)
GFR, Estimated: 47 mL/min — ABNORMAL LOW
Glucose, Bld: 146 mg/dL — ABNORMAL HIGH (ref 70–99)
Potassium: 4 mmol/L (ref 3.5–5.1)
Sodium: 136 mmol/L (ref 135–145)

## 2024-11-11 LAB — GLUCOSE, CAPILLARY
Glucose-Capillary: 146 mg/dL — ABNORMAL HIGH (ref 70–99)
Glucose-Capillary: 172 mg/dL — ABNORMAL HIGH (ref 70–99)
Glucose-Capillary: 163 mg/dL — ABNORMAL HIGH (ref 70–99)

## 2024-11-11 LAB — I-STAT CG4 LACTIC ACID, ED: Lactic Acid, Venous: 0.8 mmol/L (ref 0.5–1.9)

## 2024-11-11 LAB — HEMOGLOBIN A1C
Hgb A1c MFr Bld: 7 % — ABNORMAL HIGH (ref 4.8–5.6)
Mean Plasma Glucose: 154.2 mg/dL

## 2024-11-11 LAB — TYPE AND SCREEN
ABO/RH(D): O POS
Antibody Screen: NEGATIVE

## 2024-11-11 LAB — PROTIME-INR
INR: 1 (ref 0.8–1.2)
Prothrombin Time: 13.9 s (ref 11.4–15.2)

## 2024-11-11 LAB — HEMOGLOBIN AND HEMATOCRIT, BLOOD
HCT: 34.3 % — ABNORMAL LOW (ref 39.0–52.0)
Hemoglobin: 11.5 g/dL — ABNORMAL LOW (ref 13.0–17.0)

## 2024-11-11 MED ORDER — INSULIN ASPART 100 UNIT/ML IJ SOLN
0.0000 [IU] | INTRAMUSCULAR | Status: DC
  Administered 2024-11-11: 1 [IU] via SUBCUTANEOUS
  Filled 2024-11-11: qty 1

## 2024-11-11 MED ORDER — ACETAMINOPHEN 325 MG PO TABS
650.0000 mg | ORAL_TABLET | ORAL | Status: DC | PRN
  Administered 2024-11-11: 650 mg via ORAL
  Filled 2024-11-11: qty 2

## 2024-11-11 MED ORDER — INSULIN ASPART 100 UNIT/ML IJ SOLN: 0.0000 [IU] | Freq: Every day | INTRAMUSCULAR | Status: DC

## 2024-11-11 MED ORDER — LACTATED RINGERS IV SOLN: INTRAVENOUS | Status: DC

## 2024-11-11 MED ORDER — SODIUM CHLORIDE 0.9 % IV BOLUS
1000.0000 mL | Freq: Once | INTRAVENOUS | Status: AC
  Administered 2024-11-11: 1000 mL via INTRAVENOUS

## 2024-11-11 MED ORDER — SODIUM CHLORIDE 0.9 % IV SOLN
1.0000 g | INTRAVENOUS | Status: DC
  Administered 2024-11-12: 1 g via INTRAVENOUS
  Filled 2024-11-11: qty 10

## 2024-11-11 MED ORDER — ONDANSETRON HCL 4 MG/2ML IJ SOLN: 4.0000 mg | Freq: Four times a day (QID) | INTRAMUSCULAR | Status: DC | PRN

## 2024-11-11 MED ORDER — SODIUM CHLORIDE 0.9 % IV SOLN: INTRAVENOUS | Status: DC

## 2024-11-11 MED ORDER — POLYETHYLENE GLYCOL 3350 17 G PO PACK: 17.0000 g | PACK | Freq: Every day | ORAL | Status: DC | PRN

## 2024-11-11 MED ORDER — SODIUM CHLORIDE 0.9 % IV SOLN
1.0000 g | Freq: Once | INTRAVENOUS | Status: AC
  Administered 2024-11-11: 1 g via INTRAVENOUS
  Filled 2024-11-11: qty 10

## 2024-11-11 MED ORDER — INSULIN ASPART 100 UNIT/ML IJ SOLN
0.0000 [IU] | Freq: Three times a day (TID) | INTRAMUSCULAR | Status: DC
  Administered 2024-11-11 (×2): 2 [IU] via SUBCUTANEOUS
  Filled 2024-11-11 (×3): qty 2

## 2024-11-11 MED ORDER — HEPARIN SODIUM (PORCINE) 5000 UNIT/ML IJ SOLN: 5000.0000 [IU] | Freq: Three times a day (TID) | INTRAMUSCULAR | Status: DC

## 2024-11-11 MED ORDER — PANTOPRAZOLE SODIUM 40 MG PO TBEC
40.0000 mg | DELAYED_RELEASE_TABLET | Freq: Every day | ORAL | Status: DC
  Administered 2024-11-11: 40 mg via ORAL
  Filled 2024-11-11: qty 1

## 2024-11-11 MED ORDER — ATORVASTATIN CALCIUM 10 MG PO TABS
20.0000 mg | ORAL_TABLET | Freq: Every day | ORAL | Status: DC
  Administered 2024-11-11 – 2024-11-12 (×2): 20 mg via ORAL
  Filled 2024-11-11 (×2): qty 2

## 2024-11-11 MED ORDER — SODIUM CHLORIDE 0.9 % IV SOLN
2.0000 g | Freq: Once | INTRAVENOUS | Status: AC
  Administered 2024-11-11: 2 g via INTRAVENOUS
  Filled 2024-11-11: qty 20

## 2024-11-11 NOTE — ED Notes (Signed)
 Report given to [facility]M

## 2024-11-11 NOTE — ED Notes (Signed)
 Patient transported to CT

## 2024-11-11 NOTE — Consult Note (Signed)
 "  Urology Consult Note   Requesting Attending Physician:  Davia Nydia POUR, MD Service Providing Consult: Urology  Consulting Attending: Dr. Roseann   Reason for Consult:  Gross hematuria/neurogenic bladder  HPI: John Meza is seen in consultation for reasons noted above at the request of Rai, Ripudeep K, MD for evaluation of the above.  This is a 64 y.o. male with hx of HTN, paraplegia, neurogenic bladder (managed with BID CIC), T2DM who presented with hematuria, hypotension, and tachycardia. Noted gross hematuria ~4 days ago, presented yesterday due to malaise. Usually caths twice a day and does have some spontaneous voids outside of this. Foley catheter placed here with light pink color urine.   He is afebrile, was initially mildly tachycardic which has improved, pressures stable, on RA, no UoP measured, initial Hgb 11.5 from baseline of 12.4, UA with hematuria and c/f UTI, blood cultures currently no growth at 12 hours, imaging with mild bilateral hydroureteronephrosis with thickened bladder wall all c/s with neurogenic bladder.   Assessment:  64 y.o. male with hx above who presents with gross hematuria after self-cath episode. Urine now light pink. Overall, discussed extensively with patient and wife regarding re-establishing with urology to help determine cath schedule and appropriate management of neurogenic bladder as they have not seen a urologist in some time. In terms of his gross hematuria, given color and hgb, okay to be managed conservatively would plan to keep catheter in place for ~5 days and then can resume self caths. I advised they should move to TID given spontaneous voids in between and measure the amounts until they can be seen in clinic. Agree with follow-up urine culture and treating with culture directed antibiotics.   Recommendations: - Continue foley catheter for 5 days, then begin TID self-catheterization (discussed with patient and wife) - Continue  to treat complicated UTI, culture directed therapy - Please refer to [facility] to establish care for neurogenic bladder  Thank you for this consult. Please contact the urology consult pager with any further questions/concerns.  Past Medical History: Past Medical History:  Diagnosis Date   Cellulitis and abscess of buttock 10/2016   Diabetes mellitus    ESBL (extended spectrum beta-lactamase) producing bacteria infection 03/16/2022   Hyperkalemia 03/16/2022   Hypertension    Myositis 04/21/2022   Paraplegia (HCC) 2013   fell from ladder   Pelvic abscess in male [facility]) 05/01/2023   Pressure ulcer, buttock, right, unstageable (HCC) 12/30/2020   Pyelonephritis due to Escherichia coli 03/16/2022   Sacral decubitus ulcer, stage IV (HCC) 11/03/2021   SCI (spinal cord injury)    Sepsis due to Escherichia coli (E. coli) (HCC) 03/01/2022   Septic hip (HCC) 04/28/2023   Spine fracture 11/16/2011   T 11- T9-L1    Past Surgical History:  Past Surgical History:  Procedure Laterality Date   APPLICATION OF WOUND VAC Right 05/01/2023   Procedure: APPLICATION OF WOUND VAC;  Surgeon: Harden Jerona GAILS, MD;  Location: [facility] OR;  Service: Orthopedics;  Laterality: Right;   CHOLECYSTECTOMY N/A 05/01/2015   Procedure: LAPAROSCOPIC CHOLECYSTECTOMY WITH ATTEMPTED INTRAOPERATIVE CHOLANGIOGRAM;  Surgeon: Dann Hummer, MD;  Location[facility] OR;  Service: General;  Laterality: N/A;   HIP DISARTICULATION Right 05/01/2023   Procedure: RIGHT HIP DISARTICULATION;  Surgeon: Harden Jerona GAILS, MD;  Location: [facility] OR;  Service: Orthopedics;  Laterality: Right;   IR US  GUIDE BX ASP/DRAIN  04/27/2023   SPINE SURGERY     TEE WITHOUT CARDIOVERSION N/A 03/26/2022   Procedure: TRANSESOPHAGEAL  ECHOCARDIOGRAM (TEE);  Surgeon: Alveta Aleene PARAS, MD;  Location: [facility] ENDOSCOPY;  Service: Cardiovascular;  Laterality: N/A;   WOUND EXPLORATION Right 05/01/2023   Procedure: WOUND EXPLORATION OF HIP;  Surgeon: Harden Jerona GAILS, MD;  Location:  [facility] OR;  Service: Orthopedics;  Laterality: Right;    Medication: Current Facility-Administered Medications  Medication Dose Route Frequency Provider Last Rate Last Admin   acetaminophen  (TYLENOL ) tablet 650 mg  650 mg Oral Q4H PRN Rai, Ripudeep K, MD       atorvastatin  (LIPITOR) tablet 20 mg  20 mg Oral Q1200 Gershon Lash, MD       cefTRIAXone  (ROCEPHIN ) 2 g in sodium chloride  0.9 % 100 mL IVPB  2 g Intravenous Once Rai, Ripudeep K, MD       insulin  aspart (novoLOG ) injection 0-5 Units  0-5 Units Subcutaneous QHS Rai, Ripudeep K, MD       insulin  aspart (novoLOG ) injection 0-9 Units  0-9 Units Subcutaneous TID WC Rai, Ripudeep K, MD       lactated ringers  infusion   Intravenous Continuous Rai, Ripudeep K, MD 125 mL/hr at 11/11/24 0911 New Bag at 11/11/24 0911   ondansetron  (ZOFRAN ) injection 4 mg  4 mg Intravenous Q6H PRN Rai, Ripudeep K, MD       pantoprazole  (PROTONIX ) EC tablet 40 mg  40 mg Oral QHS Gershon Lash, MD       polyethylene glycol (MIRALAX  / GLYCOLAX ) packet 17 g  17 g Oral Daily PRN Gershon Lash, MD        Allergies: Allergies[1]  Social History: Social History[2]  Family History Family History  Problem Relation Age of Onset   Healthy Mother    Diabetes Father    Diabetes Sister     Review of Systems 10 systems were reviewed and are negative except as noted specifically in the HPI.  Objective   Vital signs in last 24 hours: BP 102/67 (BP Location: Left Arm)   Pulse 98   Temp 99.6 F (37.6 C) (Oral)   Resp 12   Ht 5' 4 (1.626 m)   SpO2 100%   BMI 27.40 kg/m   Physical Exam General: NAD, A&O, resting, appropriate HEENT: [place]/AT, EOMI, MMM Pulmonary: Normal work of breathing Cardiovascular: HDS, adequate peripheral perfusion Abdomen: Soft, NTTP, nondistended. GU: Light pink urine in foley Neuro: Appropriate  Most Recent Labs: Lab Results  Component Value Date   WBC 9.8 11/10/2024   HGB 11.5 (L) 11/10/2024   HCT 34.3 (L) 11/10/2024   PLT 247  11/10/2024    Lab Results  Component Value Date   NA 136 11/11/2024   K 4.0 11/11/2024   CL 104 11/11/2024   CO2 19 (L) 11/11/2024   BUN 42 (H) 11/11/2024   CREATININE 1.64 (H) 11/11/2024   CALCIUM  8.0 (L) 11/11/2024   MG 1.5 (L) 05/27/2023   PHOS 3.7 05/27/2023    Lab Results  Component Value Date   INR 1.0 11/10/2024   APTT 42 (H) 04/23/2023     Urine Culture: @[id number] (laburin,org,[id number],[id number])@   IMAGING: CT ABDOMEN PELVIS WO CONTRAST Result Date: 11/11/2024 EXAM: CT ABDOMEN AND PELVIS WITHOUT CONTRAST 11/11/2024 12:49:00 AM TECHNIQUE: CT of the abdomen and pelvis was performed without the administration of intravenous contrast. Multiplanar reformatted images are provided for review. Automated exposure control, iterative reconstruction, and/or weight-based adjustment of the mA/kV was utilized to reduce the radiation dose to as low as reasonably achievable. COMPARISON: CT abdomen and pelvis 04/26/2023. CLINICAL HISTORY: Paraplegic; gross/active bleeding from the  urethra. FINDINGS: LOWER CHEST: No acute abnormality. LIVER: The liver is unremarkable. GALLBLADDER AND BILE DUCTS: Gallbladder surgically absent. No biliary ductal dilatation. SPLEEN: No acute abnormality. PANCREAS: No acute abnormality. ADRENAL GLANDS: No acute abnormality. KIDNEYS, URETERS AND BLADDER: Mild bilateral hydroureteronephrosis at the level of the bladder without obstructing calculus identified. Mild bilateral perinephric fat stranding which is unchanged. Marked bladder wall thickening with surrounding inflammation. The bladder is decompressed by foley catheter. Air seen within the bladder and within multiple small bladder diverticula. The prostate gland is enlarged. Small foci of air within the central right prostate gland, indeterminate. GI AND BOWEL: Stomach demonstrates no acute abnormality. The appendix appears normal. There is no bowel obstruction. PERITONEUM AND RETROPERITONEUM: No ascites. No free air.  VASCULATURE: Aorta is normal in caliber. LYMPH NODES: No lymphadenopathy. REPRODUCTIVE ORGANS: No acute abnormality. BONES AND SOFT TISSUES: Interval amputation of the right femur with scarring in the surgical bed. Rectolumbar fusion hardware appears uncomplicated. Small fat containing left inguinal hernia. No acute osseous abnormality. No focal soft tissue abnormality. IMPRESSION: 1. Marked bladder wall thickening with surrounding inflammation concerning for cystitis. Neoplastic process not excluded. 2. Bladder is decompressed by Foley catheter, with air in the bladder and within multiple small bladder diverticula. 3. Mild bilateral hydroureteronephrosis to the level of the bladder, without obstructing calculus. 4. Prostatomegaly with small foci of air in the central right prostate gland, indeterminate. Correlate clinically for infection. 5. Interval amputation of the right femur with scarring in the surgical bed. Electronically signed by: Greig Pique MD 11/11/2024 02:37 AM EST RP Workstation: [id number]    ------      [1]  Allergies Allergen Reactions   Macrobid  [Nitrofurantoin ] Itching and Rash  [2]  Social History Tobacco Use   Smoking status: Former    Current packs/day: 0.00    Average packs/day: 1 pack/day for 5.0 years (5.0 ttl pk-yrs)    Types: Cigarettes    Start date: 10/12/2006    Quit date: 10/13/2011    Years since quitting: 13.0   Smokeless tobacco: Never   Tobacco comments:    03-24-19 per pt he stopped 1 mo ago   Vaping Use   Vaping status: Never Used  Substance Use Topics   Alcohol use: Not Currently    Alcohol/week: 1.0 standard drink of alcohol    Types: 1 Cans of beer per week   Drug use: Never   "

## 2024-11-11 NOTE — Progress Notes (Addendum)
 Chaplain met with Pt upon arrival Pt stated that at this time no spiritual or emotional support care was needed, chaplain honored Pt's request. Chaplain services remain available upon request.

## 2024-11-11 NOTE — H&P (Signed)
 " History and Physical    John Meza FMW:[id number] DOB: 1961-01-23 DOA: 11/10/2024  PCP: Delbert Clam, MD (Confirm with patient/family/NH records and if not entered, this has to be entered at [facility] point of entry) Patient coming from: Home  I have personally briefly reviewed patient's old medical records in [facility]  Chief Complaint: [employer] drilling  HPI: John Meza is a 64 y.o. 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   Noted to have gross hematuria in the afternoon. No fevers, vomiting. Paraplegic from a remote accident. Self-caths at home in the AM and evening. Not on AC. Noted to develop tachycardia and hypotension which was unusual for him. CT showed thickening of the bladder wall concerning for cystitis. Given rocephin  in the ED.   At the time of my conversation, a Spanish interpreter was used. He reports feeling well. No chest pain, headaches, palpitations, fevers, or general malaise.   ED Course:   CT abd/pel per radiology:   IMPRESSION: 1. Marked bladder wall thickening with surrounding inflammation concerning for cystitis. Neoplastic process not excluded. 2. Bladder is decompressed by Foley catheter, with air in the bladder and within multiple small bladder diverticula. 3. Mild bilateral hydroureteronephrosis to the level of the bladder, without obstructing calculus. 4. Prostatomegaly with small foci of air in the central right prostate gland, indeterminate. Correlate clinically for infection. 5. Interval amputation of the right femur with scarring in the surgical bed.  Vitals:  T: 98.6 HR: 105 RR: 20 BP: 102/66  Normal lactic acid.   Stable anemia of 11. UA grossly bloody.   Meds: 1 g Rocephin   Review of Systems: As per HPI  otherwise all other systems reviewed and are negative. Unacceptable ROS statements: 10 systems reviewed, Extensive (without elaboration).  Acceptable ROS statements: All others negative, All others reviewed and are negative, and All others unremarkable, with at LEAST ONE ROS documented Cant double dip - if using for HPI cant use for ROS  Past Medical History:  Diagnosis Date   Cellulitis and abscess of buttock 10/2016   Diabetes mellitus    ESBL (extended spectrum beta-lactamase) producing bacteria infection 03/16/2022   Hyperkalemia 03/16/2022   Hypertension    Myositis 04/21/2022   Paraplegia (HCC) 2013   fell from ladder   Pelvic abscess in male [facility]) 05/01/2023   Pressure ulcer, buttock, right, unstageable (HCC) 12/30/2020   Pyelonephritis due to Escherichia coli 03/16/2022   Sacral decubitus ulcer, stage IV (HCC) 11/03/2021   SCI (spinal cord injury)    Sepsis due to Escherichia coli (E. coli) (HCC) 03/01/2022   Septic hip (HCC) 04/28/2023   Spine fracture 11/16/2011   T 11- T9-L1    Past Surgical History:  Procedure Laterality Date   APPLICATION OF WOUND VAC Right 05/01/2023   Procedure: APPLICATION OF WOUND VAC;  Surgeon: Harden Jerona GAILS, MD;  Location: [facility] OR;  Service: Orthopedics;  Laterality: Right;   CHOLECYSTECTOMY N/A 05/01/2015   Procedure: LAPAROSCOPIC CHOLECYSTECTOMY WITH ATTEMPTED INTRAOPERATIVE CHOLANGIOGRAM;  Surgeon: Dann Hummer, MD;  Location: [facility] OR;  Service: General;  Laterality: N/A;   HIP DISARTICULATION Right 05/01/2023   Procedure: RIGHT HIP DISARTICULATION;  Surgeon: Harden Jerona GAILS, MD;  Location: [facility] OR;  Service: Orthopedics;  Laterality: Right;   IR US  GUIDE BX ASP/DRAIN  04/27/2023   SPINE SURGERY     TEE  WITHOUT CARDIOVERSION N/A 03/26/2022   Procedure: TRANSESOPHAGEAL ECHOCARDIOGRAM (TEE);  Surgeon: Alveta Aleene PARAS, MD;  Location: [facility] ENDOSCOPY;  Service: Cardiovascular;  Laterality: N/A;   WOUND EXPLORATION Right 05/01/2023   Procedure:  WOUND EXPLORATION OF HIP;  Surgeon: Harden Jerona GAILS, MD;  Location: [facility] OR;  Service: Orthopedics;  Laterality: Right;    Social History  reports that he quit smoking about 13 years ago. His smoking use included cigarettes. He started smoking about 18 years ago. He has a 5 pack-year smoking history. He has never used smokeless tobacco. He reports that he does not currently use alcohol after a past usage of about 1.0 standard drink of alcohol per week. He reports that he does not use drugs.  Allergies[1]  Family History  Problem Relation Age of Onset   Healthy Mother    Diabetes Father    Diabetes Sister    Prior to Admission medications  Medication Sig Start Date End Date Taking? Authorizing Provider  amLODipine  (NORVASC ) 2.5 MG tablet Take 1 tablet (2.5 mg total) by mouth daily. 09/04/24   Newlin, Enobong, MD  atorvastatin  (LIPITOR) 20 MG tablet Take 1 tablet (20 mg total) by mouth daily at 12 noon. 09/04/24   Newlin, Enobong, MD  dapagliflozin  propanediol (FARXIGA ) 10 MG TABS tablet Take 1 tablet (10 mg total) by mouth daily. 09/06/24   Newlin, Enobong, MD  ferrous sulfate  (FEROSUL) 325 (65 FE) MG tablet Take 1 tablet (325 mg total) by mouth daily. Patient not taking: Reported on 09/04/2024 05/03/24   Newlin, Enobong, MD  JANUVIA  100 MG tablet Take 1 tablet (100 mg total) by mouth daily. 03/02/24   Newlin, Enobong, MD  metFORMIN  (GLUCOPHAGE ) 1000 MG tablet Take 1 tablet (1,000 mg total) by mouth 2 (two) times daily with a meal. 09/04/24   Delbert Clam, MD  ondansetron  (ZOFRAN ) 4 MG tablet Take 1 tablet (4 mg total) by mouth every 8 (eight) hours as needed for nausea or vomiting. 09/04/24   Newlin, Enobong, MD  pantoprazole  (PROTONIX ) 40 MG tablet Take 1 tablet (40 mg total) by mouth at bedtime. 09/04/24   Newlin, Enobong, MD  valsartan -hydrochlorothiazide  (DIOVAN -HCT) 320-25 MG tablet Take 1 tablet by mouth daily. 03/02/24   Newlin, Enobong, MD  loratadine  (CLARITIN ) 10 MG tablet Take 1  tablet (10 mg total) by mouth daily. As needed for itchy throat/allergy symptoms Patient not taking: Reported on 03/24/2019 12/26/18 07/05/20  Alec House, MD    Physical Exam: Vitals:   11/11/24 0330 11/11/24 0400 11/11/24 0430 11/11/24 0500  BP: 117/68 107/68 114/66 102/66  Pulse: (!) 106 (!) 102 (!) 105 (!) 105  Resp: 18 19 16 20   Temp: 98.6 F (37 C)     TempSrc: Oral     SpO2: 100% 100% 100% 98%  Height:        Constitutional: NAD, calm, comfortable Vitals:   11/11/24 0330 11/11/24 0400 11/11/24 0430 11/11/24 0500  BP: 117/68 107/68 114/66 102/66  Pulse: (!) 106 (!) 102 (!) 105 (!) 105  Resp: 18 19 16 20   Temp: 98.6 F (37 C)     TempSrc: Oral     SpO2: 100% 100% 100% 98%  Height:       Eyes: PERRL, lids and conjunctivae normal ENMT: Mucous membranes are moist. Posterior pharynx clear of any exudate or lesions.Normal dentition.  Neck: normal, supple, no masses, no thyromegaly Respiratory: clear to auscultation bilaterally, no wheezing, no crackles. Normal respiratory effort. No accessory muscle use.  Cardiovascular: Regular rate and  rhythm, no murmurs / rubs / gallops. No extremity edema. 2+ pedal pulses. No carotid bruits.  Abdomen: no tenderness, no masses palpated. No hepatosplenomegaly. Bowel sounds positive.  Musculoskeletal: no clubbing / cyanosis. No joint deformity upper and lower extremities. Good ROM, no contractures. Normal muscle tone.  Skin: no rashes, lesions, ulcers. No induration Neurologic: CN 2-12 grossly intact. Sensation intact, DTR normal. Strength 5/5 in all 4.  Psychiatric: Normal judgment and insight. Alert and oriented x 3. Normal mood.   (Anything < 9 systems with 2 bullets each down codes to level 1) (If patient refuses exam cant bill higher level) (Make sure to document decubitus ulcers present on admission -- if possible -- and whether patient has chronic indwelling catheter at time of admission)  Labs on Admission: I have personally  reviewed following labs and imaging studies  CBC: Recent Labs  Lab 11/10/24 1628 11/10/24 2339  WBC 9.8  --   NEUTROABS 6.4  --   HGB 12.4* 11.5*  HCT 37.8* 34.3*  MCV 92.0  --   PLT 247  --     Basic Metabolic Panel: Recent Labs  Lab 11/10/24 1628  NA 138  K 4.9  CL 102  CO2 24  GLUCOSE 196*  BUN 34*  CREATININE 1.15  CALCIUM  9.4    GFR: CrCl cannot be calculated (Unknown ideal weight.).  Liver Function Tests: No results for input(s): AST, ALT, ALKPHOS, BILITOT, PROT, ALBUMIN  in the last 168 hours.  Urine analysis:    Component Value Date/Time   COLORURINE RED (A) 11/10/2024 2345   APPEARANCEUR TURBID (A) 11/10/2024 2345   APPEARANCEUR Clear 03/19/2022 1413   LABSPEC  11/10/2024 2345    TEST NOT REPORTED DUE TO COLOR INTERFERENCE OF URINE PIGMENT   PHURINE  11/10/2024 2345    TEST NOT REPORTED DUE TO COLOR INTERFERENCE OF URINE PIGMENT   GLUCOSEU (A) 11/10/2024 2345    TEST NOT REPORTED DUE TO COLOR INTERFERENCE OF URINE PIGMENT   HGBUR (A) 11/10/2024 2345    TEST NOT REPORTED DUE TO COLOR INTERFERENCE OF URINE PIGMENT   BILIRUBINUR (A) 11/10/2024 2345    TEST NOT REPORTED DUE TO COLOR INTERFERENCE OF URINE PIGMENT   BILIRUBINUR negative 03/02/2024 1037   BILIRUBINUR Negative 03/19/2022 1413   KETONESUR (A) 11/10/2024 2345    TEST NOT REPORTED DUE TO COLOR INTERFERENCE OF URINE PIGMENT   PROTEINUR (A) 11/10/2024 2345    TEST NOT REPORTED DUE TO COLOR INTERFERENCE OF URINE PIGMENT   UROBILINOGEN 0.2 03/02/2024 1037   UROBILINOGEN 1.0 02/28/2022 1240   NITRITE (A) 11/10/2024 2345    TEST NOT REPORTED DUE TO COLOR INTERFERENCE OF URINE PIGMENT   LEUKOCYTESUR (A) 11/10/2024 2345    TEST NOT REPORTED DUE TO COLOR INTERFERENCE OF URINE PIGMENT    Radiological Exams on Admission: CT ABDOMEN PELVIS WO CONTRAST Result Date: 11/11/2024 EXAM: CT ABDOMEN AND PELVIS WITHOUT CONTRAST 11/11/2024 12:49:00 AM TECHNIQUE: CT of the abdomen and pelvis was  performed without the administration of intravenous contrast. Multiplanar reformatted images are provided for review. Automated exposure control, iterative reconstruction, and/or weight-based adjustment of the mA/kV was utilized to reduce the radiation dose to as low as reasonably achievable. COMPARISON: CT abdomen and pelvis 04/26/2023. CLINICAL HISTORY: Paraplegic; gross/active bleeding from the urethra. FINDINGS: LOWER CHEST: No acute abnormality. LIVER: The liver is unremarkable. GALLBLADDER AND BILE DUCTS: Gallbladder surgically absent. No biliary ductal dilatation. SPLEEN: No acute abnormality. PANCREAS: No acute abnormality. ADRENAL GLANDS: No acute abnormality. KIDNEYS, URETERS AND  BLADDER: Mild bilateral hydroureteronephrosis at the level of the bladder without obstructing calculus identified. Mild bilateral perinephric fat stranding which is unchanged. Marked bladder wall thickening with surrounding inflammation. The bladder is decompressed by foley catheter. Air seen within the bladder and within multiple small bladder diverticula. The prostate gland is enlarged. Small foci of air within the central right prostate gland, indeterminate. GI AND BOWEL: Stomach demonstrates no acute abnormality. The appendix appears normal. There is no bowel obstruction. PERITONEUM AND RETROPERITONEUM: No ascites. No free air. VASCULATURE: Aorta is normal in caliber. LYMPH NODES: No lymphadenopathy. REPRODUCTIVE ORGANS: No acute abnormality. BONES AND SOFT TISSUES: Interval amputation of the right femur with scarring in the surgical bed. Rectolumbar fusion hardware appears uncomplicated. Small fat containing left inguinal hernia. No acute osseous abnormality. No focal soft tissue abnormality. IMPRESSION: 1. Marked bladder wall thickening with surrounding inflammation concerning for cystitis. Neoplastic process not excluded. 2. Bladder is decompressed by Foley catheter, with air in the bladder and within multiple small bladder  diverticula. 3. Mild bilateral hydroureteronephrosis to the level of the bladder, without obstructing calculus. 4. Prostatomegaly with small foci of air in the central right prostate gland, indeterminate. Correlate clinically for infection. 5. Interval amputation of the right femur with scarring in the surgical bed. Electronically signed by: Greig Pique MD 11/11/2024 02:37 AM EST RP Workstation: [id number]     Assessment/Plan Active Problems:   Paraplegia [facility])   Neurogenic bladder   Neurogenic bowel  (please populate well all problems here in Problem List. (For example, if patient is on BP meds at home and you resume or decide to hold them, it is a problem that needs to be her. Same for CAD, COPD, HLD and so on)  #Gross Hematuria Patient with history of paraplegia due to remote accident noted to have gross hematuria prior to admission. He practices self-cathing. CT showed bladder wall thickening. Vitals showed hypotension and tachycardia and received Rocephin . Discussed with pharmacy who are okay with Rocephin . Patient appears HDS on my initial exam. Continue to monitor.   #Neurogenic Bladder Chronic in setting of paraplegia. Foley in place.Consider urology consult if bleeding continues or to biopsy bladder if malignancy is a concern.   #Paraplegia Due to remote accident. Can contribute to hypotension as well, but would not explain gross hematuria. BP seems to be improving from 104/67 to 118/71.   #HTN Holding home Norvasc  and Diovan  in setting of hypotension. Providing maintenance fluids.   #T2DM Correctional insulin  provided.    DVT prophylaxis: SCD Code Status:   Full ( Family Communication:  Nil Disposition Plan:   Patient is from:  Home  Anticipated DC to:  Home  Anticipated DC date:  11/13/2024   Anticipated DC barriers: Mobility   Consults called:  Nil  Admission status:  Inpatient  Severity of Illness: The appropriate patient status for this patient is OBSERVATION.  Observation status is judged to be reasonable and necessary in order to provide the required intensity of service to ensure the patient's safety. The patient's presenting symptoms, physical exam findings, and initial radiographic and laboratory data in the context of their medical condition is felt to place them at decreased risk for further clinical deterioration. Furthermore, it is anticipated that the patient will be medically stable for discharge from the hospital within 2 midnights of admission.     Hildegard Fila MD [facility]  How to contact the [facility] Attending or Consulting provider 7A - 7P or covering provider during after hours 7P -7A, for this  patient?   Check the care team in [facility] and look for a) attending/consulting [facility] provider listed and b) the [facility] team listed Log into [email or web address] and use [facility] universal password to access. If you do not have the password, please contact the hospital operator. Locate the [facility] provider you are looking for under [facility] and page to a number that you can be directly reached. If you still have difficulty reaching the provider, please page the [facility] (Director on Call) for the Hospitalists listed on amion for assistance.  11/11/2024, 6:14 AM       [1]  Allergies Allergen Reactions   Macrobid  [Nitrofurantoin ] Itching and Rash   "

## 2024-11-11 NOTE — Progress Notes (Signed)
 "          [facility]                                                                              [place], is a 64 y.o. 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   Patient is paraplegic from a remote accident, self caths at home in the morning and evening.  Not on any anticoagulation.  Per the ED triage note, patient complained of blood in the urine for 4 days.  No fevers, pain nausea or vomiting.  Per EDP note, patient's wife had reported that he performed a cath in the morning and the bleeding started prior to the evening procedure. In ED, on arrival, patient was noted to be hypotensive and tachycardiac.  Temp 98.6 F, RR 12-25, heart rate 116, BP 95/78, O2 sat 100% on room air  CT abd/pel per radiology:   IMPRESSION: 1. Marked bladder wall thickening with surrounding inflammation concerning for cystitis. Neoplastic process not excluded. 2. Bladder is decompressed by Foley catheter, with air in the bladder and within multiple small bladder diverticula. 3. Mild bilateral hydroureteronephrosis to the level of the bladder, without obstructing calculus. 4. Prostatomegaly with small foci of air in the central right prostate gland, indeterminate. Correlate clinically for infection. 5. Interval amputation of the right femur with scarring in the surgical bed.   Patient was admitted for further workup.  Assessment & Plan     SIRS  with gross hematuria the setting of neurogenic bladder, intermittent self-catheterization - Presented with hypotension, tachycardia, mild tachypnea,  hematuria.  Possibly due to traumatic insertion and self-catheterization however possibility of hemorrhagic cystitis.  CT abdomen showed cystitis, mild bilateral hydroureteronephrosis, no obstructing calculi, prostatomegaly with small foci of air in the central right prostate gland - Follow blood cultures, UA and culture, IV Rocephin  - Foley catheter placed - Ongoing hematuria, discussed with urology, Dr. Jesusa, will see today - Follow CBC, bmet  Acute kidney injury - Likely due to dehydration, medications, cystitis, hematuria,  baseline creatinine 0.91 - Hold Farxiga , valsartan  hydrochlorothiazide  - Creatinine increased to 1.63 today with metabolic acidosis - Increase IV fluids to 125 cc an hour, follow CBC, may need blood transfusion  Chronic normocytic anemia - Hemoglobin 11.5, close to baseline of 11-12 - CBC pending today, will follow, transfuse for hemoglobin less than 8  Essential hypertension - Currently hypotensive, hold amlodipine , valsartan , hydrochlorothiazide  - Continue IV fluid hydration    Paraplegia (HCC),   Neurogenic bladder - Management as #1  Diabetes mellitus type 2 - Placed on sliding scale insulin  while inpatient, hold Farxiga , metformin  Hemoglobin A1c 6.6 on 09/04/2024 CBG (last 3)  Recent Labs    11/11/24 0815  GLUCAP 146*   Placed on carb modified diet,  sensitive sliding scale insulin   Hyperlipidemia - Continue statin  GERD - Continue Protonix   History of right hip disarticulation - Noted  Estimated body mass index is 27.4 kg/m as calculated from the following:   Height as of this encounter: 5' 4 (1.626 m).   Weight as of 05/01/23: 72.4 kg.  Code Status: Full code DVT Prophylaxis:  Place and maintain sequential compression device Start: 11/11/24 0627   Level of Care: Level of care: Telemetry Family Communication: Updated patient's wife at the bedside Disposition Plan:      Remains inpatient appropriate: Pending workup   Procedures:    None  Consultants:   Urology  Antimicrobials:   Anti-infectives (From admission, onward)    Start     Dose/Rate Route Frequency Ordered Stop   11/11/24 1500  cefTRIAXone  (ROCEPHIN ) 2 g in sodium chloride  0.9 % 100 mL IVPB        2 g 200 mL/hr over 30 Minutes Intravenous  Once 11/11/24 0625     11/11/24 0315  cefTRIAXone  (ROCEPHIN ) 1 g in sodium chloride  0.9 % 100 mL IVPB        1 g 200 mL/hr over 30 Minutes Intravenous  Once 11/11/24 0304 11/11/24 0358          Medications  atorvastatin   20 mg Oral Q1200   insulin  aspart  0-9 Units Subcutaneous Q4H   pantoprazole   40 mg Oral QHS      Subjective:   John Meza was seen and examined today.  Ongoing hematuria in the Foley bag.  Denies any pain, nausea or vomiting or any fevers. Objective:   Vitals:   11/11/24 0430 11/11/24 0500 11/11/24 0630 11/11/24 0715  BP: 114/66 102/66 118/71 102/67  Pulse: (!) 105 (!) 105 (!) 105 98  Resp: 16 20 12    Temp:    99.6 F (37.6 C)  TempSrc:    Oral  SpO2: 100% 98% 100% 100%  Height:        Intake/Output Summary (Last 24 hours) at 11/11/2024 0906 Last data filed at 11/11/2024 0830 Gross per 24 hour  Intake 400 ml  Output --  Net 400 ml     Wt Readings from Last 3 Encounters:  05/01/23 72.4 kg  04/11/23 87 kg  03/20/22 87 kg     Exam General: Alert and oriented x 3, NAD Cardiovascular: S1 S2 auscultated,  RRR Respiratory: Clear to auscultation bilaterally, no wheezing, rales or rhonchi Gastrointestinal: Soft, nontender, nondistended, + bowel sounds Ext: Right hip disarticulation Neuro: No new deficits Psych: Normal affect     Data Reviewed:  I have personally reviewed following labs    CBC Lab Results  Component Value Date   WBC 9.8 11/10/2024   RBC 4.11 (L) 11/10/2024   HGB 11.5 (L) 11/10/2024   HCT 34.3 (L) 11/10/2024   MCV 92.0 11/10/2024   MCH 30.2 11/10/2024   PLT 247 11/10/2024   MCHC 32.8 11/10/2024   RDW 13.3 11/10/2024    LYMPHSABS 1.9 11/10/2024   MONOABS 0.8 11/10/2024   EOSABS 0.7 (H) 11/10/2024   BASOSABS 0.1 11/10/2024     Last metabolic panel Lab Results  Component Value Date   NA 136 11/11/2024   K 4.0 11/11/2024   CL 104 11/11/2024   CO2 19 (L) 11/11/2024   BUN 42 (H) 11/11/2024   CREATININE 1.64 (H) 11/11/2024   GLUCOSE 146 (H) 11/11/2024   GFRNONAA 47 (L) 11/11/2024   GFRAA 127 11/18/2020   CALCIUM  8.0 (L) 11/11/2024  PHOS 3.7 05/27/2023   PROT 6.5 09/04/2024   ALBUMIN  3.9 09/04/2024   LABGLOB 2.6 09/04/2024   AGRATIO 1.2 01/27/2023   BILITOT 0.3 09/04/2024   ALKPHOS 112 09/04/2024   AST 14 09/04/2024   ALT 14 09/04/2024   ANIONGAP 13 11/11/2024    CBG (last 3)  Recent Labs    11/11/24 0815  GLUCAP 146*      Coagulation Profile: Recent Labs  Lab 11/10/24 2339  INR 1.0     Radiology Studies: I have personally reviewed the imaging studies  CT ABDOMEN PELVIS WO CONTRAST Result Date: 11/11/2024 EXAM: CT ABDOMEN AND PELVIS WITHOUT CONTRAST 11/11/2024 12:49:00 AM TECHNIQUE: CT of the abdomen and pelvis was performed without the administration of intravenous contrast. Multiplanar reformatted images are provided for review. Automated exposure control, iterative reconstruction, and/or weight-based adjustment of the mA/kV was utilized to reduce the radiation dose to as low as reasonably achievable. COMPARISON: CT abdomen and pelvis 04/26/2023. CLINICAL HISTORY: Paraplegic; gross/active bleeding from the urethra. FINDINGS: LOWER CHEST: No acute abnormality. LIVER: The liver is unremarkable. GALLBLADDER AND BILE DUCTS: Gallbladder surgically absent. No biliary ductal dilatation. SPLEEN: No acute abnormality. PANCREAS: No acute abnormality. ADRENAL GLANDS: No acute abnormality. KIDNEYS, URETERS AND BLADDER: Mild bilateral hydroureteronephrosis at the level of the bladder without obstructing calculus identified. Mild bilateral perinephric fat stranding which is unchanged. Marked bladder  wall thickening with surrounding inflammation. The bladder is decompressed by foley catheter. Air seen within the bladder and within multiple small bladder diverticula. The prostate gland is enlarged. Small foci of air within the central right prostate gland, indeterminate. GI AND BOWEL: Stomach demonstrates no acute abnormality. The appendix appears normal. There is no bowel obstruction. PERITONEUM AND RETROPERITONEUM: No ascites. No free air. VASCULATURE: Aorta is normal in caliber. LYMPH NODES: No lymphadenopathy. REPRODUCTIVE ORGANS: No acute abnormality. BONES AND SOFT TISSUES: Interval amputation of the right femur with scarring in the surgical bed. Rectolumbar fusion hardware appears uncomplicated. Small fat containing left inguinal hernia. No acute osseous abnormality. No focal soft tissue abnormality. IMPRESSION: 1. Marked bladder wall thickening with surrounding inflammation concerning for cystitis. Neoplastic process not excluded. 2. Bladder is decompressed by Foley catheter, with air in the bladder and within multiple small bladder diverticula. 3. Mild bilateral hydroureteronephrosis to the level of the bladder, without obstructing calculus. 4. Prostatomegaly with small foci of air in the central right prostate gland, indeterminate. Correlate clinically for infection. 5. Interval amputation of the right femur with scarring in the surgical bed. Electronically signed by: Greig Pique MD 11/11/2024 02:37 AM EST RP Workstation: [id number]       Nydia Distance M.D. [facility] 11/11/2024, 9:06 AM  Available via Epic secure chat 7am-7pm After 7 pm, please refer to night coverage provider listed on amion.    "

## 2024-11-12 ENCOUNTER — Encounter: Payer: Self-pay | Admitting: Hematology

## 2024-11-12 ENCOUNTER — Other Ambulatory Visit (HOSPITAL_COMMUNITY): Payer: Self-pay

## 2024-11-12 DIAGNOSIS — E119 Type 2 diabetes mellitus without complications: Secondary | ICD-10-CM

## 2024-11-12 LAB — CBC
HCT: 29.6 % — ABNORMAL LOW (ref 39.0–52.0)
Hemoglobin: 9.8 g/dL — ABNORMAL LOW (ref 13.0–17.0)
MCH: 29.8 pg (ref 26.0–34.0)
MCHC: 33.1 g/dL (ref 30.0–36.0)
MCV: 90 fL (ref 80.0–100.0)
Platelets: 153 10*3/uL (ref 150–400)
RBC: 3.29 MIL/uL — ABNORMAL LOW (ref 4.22–5.81)
RDW: 13.8 % (ref 11.5–15.5)
WBC: 11.3 10*3/uL — ABNORMAL HIGH (ref 4.0–10.5)
nRBC: 0 % (ref 0.0–0.2)

## 2024-11-12 LAB — COMPREHENSIVE METABOLIC PANEL WITH GFR
ALT: 12 U/L (ref 0–44)
AST: 17 U/L (ref 15–41)
Albumin: 3 g/dL — ABNORMAL LOW (ref 3.5–5.0)
Alkaline Phosphatase: 94 U/L (ref 38–126)
Anion gap: 11 (ref 5–15)
BUN: 28 mg/dL — ABNORMAL HIGH (ref 8–23)
CO2: 19 mmol/L — ABNORMAL LOW (ref 22–32)
Calcium: 8.4 mg/dL — ABNORMAL LOW (ref 8.9–10.3)
Chloride: 108 mmol/L (ref 98–111)
Creatinine, Ser: 1.12 mg/dL (ref 0.61–1.24)
GFR, Estimated: >60 mL/min
Glucose, Bld: 125 mg/dL — ABNORMAL HIGH (ref 70–99)
Potassium: 4 mmol/L (ref 3.5–5.1)
Sodium: 139 mmol/L (ref 135–145)
Total Bilirubin: 0.3 mg/dL (ref 0.0–1.2)
Total Protein: 6 g/dL — ABNORMAL LOW (ref 6.5–8.1)

## 2024-11-12 LAB — GLUCOSE, CAPILLARY
Glucose-Capillary: 162 mg/dL — ABNORMAL HIGH (ref 70–99)
Glucose-Capillary: 113 mg/dL — ABNORMAL HIGH (ref 70–99)
Glucose-Capillary: 187 mg/dL — ABNORMAL HIGH (ref 70–99)

## 2024-11-12 LAB — HIV ANTIBODY (ROUTINE TESTING W REFLEX): HIV Screen 4th Generation wRfx: NONREACTIVE

## 2024-11-12 MED ORDER — ONDANSETRON HCL 4 MG PO TABS
4.0000 mg | ORAL_TABLET | Freq: Three times a day (TID) | ORAL | 0 refills | Status: AC | PRN
  Filled 2024-11-12: qty 20, 7d supply, fill #0

## 2024-11-12 MED ORDER — CEPHALEXIN 500 MG PO CAPS
500.0000 mg | ORAL_CAPSULE | Freq: Two times a day (BID) | ORAL | 0 refills | Status: AC
  Filled 2024-11-12: qty 10, 5d supply, fill #0

## 2024-11-12 NOTE — Discharge Instructions (Signed)

## 2024-11-12 NOTE — Progress Notes (Signed)
 With the assistance of spanish interpreter, reviewed AVS with the patient and his wife and answered all questions. Provided education with teach back for foley care. Wife will call urology office to schedule follow up and is aware [facility] unable to see urology. TOC filled prescriptions and prescriptions given to the patient prior to discharge. Wife confirmed they have safe transportation home. Patient discharged with all belongings

## 2024-11-12 NOTE — Discharge Summary (Signed)
 " Physician Discharge Summary   Patient: John Meza MRN: [id number] DOB: 1961-06-28  Admit date:     11/10/2024  Discharge date: 11/12/24  Discharge Physician: Nydia Distance, md    PCP: Delbert Clam, MD   Recommendations at discharge:   Continue Foley catheter for 5 days, then begin 3 times daily self-catheterization. 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   Patient is paraplegic from a remote accident, self caths at home in the morning and evening.  Not on any anticoagulation.  Per the ED triage note, patient complained of blood in the urine for 4 days.  No fevers, pain nausea or vomiting.  Per EDP note, patient's wife had reported that he performed a cath in the morning and the bleeding started prior to the evening procedure. In ED, on arrival, patient was noted to be hypotensive and tachycardiac.  Temp 98.6 F, RR 12-25, heart rate 116, BP 95/78, O2 sat 100% on room air   CT abd/pel per radiology:   IMPRESSION: 1. Marked bladder wall thickening with surrounding inflammation concerning for cystitis. Neoplastic process not excluded. 2. Bladder is decompressed by Foley catheter, with air in the bladder and within multiple small bladder diverticula. 3. Mild bilateral hydroureteronephrosis to the level of the bladder, without obstructing calculus. 4. Prostatomegaly with small foci of air in the central right  prostate gland, indeterminate. Correlate clinically for infection. 5. Interval amputation of the right femur with scarring in the surgical bed.   Patient was admitted for further workup.   Assessment and Plan:  SIRS  with gross hematuria the setting of neurogenic bladder, intermittent self-catheterization - Presented with hypotension, tachycardia, mild tachypnea, hematuria.  Possibly due to traumatic insertion and self-catheterization however possibility of hemorrhagic cystitis.  CT abdomen showed cystitis, mild bilateral hydroureteronephrosis, no obstructing calculi, prostatomegaly with small foci of air in the central right prostate gland - Blood cultures negative.,   - Foley catheter placed, seen by urology, recommended to continue Foley catheter for 5 days, then self cath 3 times daily - Outpatient follow-up with urology. - Hematuria has completely resolved, clear urine in the Foley  Acute kidney injury - Likely due to dehydration, medications, cystitis, hematuria,  baseline creatinine 0.91 - Hold Farxiga , valsartan  hydrochlorothiazide  - Creatinine increased to 1.63 today with metabolic acidosis - Patient was placed on IV fluid hydration, creatinine improved to 1.1 at discharge, at baseline   Chronic normocytic anemia - Hemoglobin 11.5, close to baseline of 11-12 - Hemoglobin stable at discharge   Essential hypertension - BP was borderline on arrival  - Improving, continue amlodipine , hold valsartan  HCTZ     Paraplegia (HCC),   Neurogenic bladder - Management as #1   Diabetes mellitus type 2 Hemoglobin A1c 6.6 on 09/04/2024 Placed on sliding care insulin  while inpatient -Continue outpatient regimen   Hyperlipidemia - Continue statin   GERD - Continue Protonix    History of right hip disarticulation - Noted  Estimated body mass index is 27.4 kg/m as calculated from the following:   Height as of this encounter: 5' 4 (1.626 m).   Weight as of 05/01/23: 72.4 kg.          Pain control - [place]  Controlled Substance Reporting System database was reviewed. and patient was instructed, not to drive, operate heavy machinery, perform activities at heights, swimming or participation in water  activities or provide baby-sitting services while on Pain, Sleep and Anxiety Medications; until their outpatient Physician has advised to do so again. Also recommended to not to take more than prescribed Pain, Sleep and Anxiety Medications.  Consultants: Urology Procedures performed: Foley catheter Disposition: Home Diet recommendation:  Discharge Diet Orders (From admission, onward)     Start     Ordered   11/12/24 0000  Diet Carb Modified        11/12/24 1050            DISCHARGE MEDICATION: Allergies as of 11/12/2024       Reactions   Macrobid  [nitrofurantoin ] Itching, Rash        Medication List     TAKE these medications    amLODipine  2.5 MG tablet Commonly known as: NORVASC  Tome 1 tableta (2.5 mg en total) por va oral diariamente. (Take 1 tablet (2.5 mg total) by mouth daily.)   atorvastatin  20 MG tablet Commonly known as: LIPITOR Take 1 tablet (20 mg total) by mouth daily at 12 noon.   cephALEXin  500 MG capsule Commonly known as: KEFLEX  Take 1 capsule (500 mg total) by mouth 2 (two) times daily for 5 days.   Farxiga  10 MG Tabs tablet Generic drug: dapagliflozin  propanediol Tome 1 tableta (10 mg en total) por va oral diariamente. (Take 1 tablet (10 mg total) by mouth daily.)   metFORMIN  1000 MG tablet Commonly known as: GLUCOPHAGE  Take 1 tablet (1,000 mg total) by mouth 2 (two) times daily with a meal.   ondansetron  4 MG tablet Commonly known as: ZOFRAN  Take 1 tablet (4 mg total) by mouth every 8 (eight) hours as needed for nausea or vomiting.   pantoprazole  40 MG tablet Commonly known as: PROTONIX  Tome 1 tableta (40 mg en total) por va oral antes de acostarse. (Take 1 tablet (40 mg total) by mouth at bedtime.)    valsartan -hydrochlorothiazide  320-25 MG tablet Commonly known as: DIOVAN -HCT Tome 1 tableta por va oral diariamente. (Take 1 tablet by mouth daily.)        Follow-up Information     Delbert Clam, MD. Schedule an appointment as soon as possible for a visit in 2 week(s).   Specialty: Family Medicine Why: for hospital follow-up Contact information: [address] [place] [address] [place] [address] [phone]         [facility]. Schedule an appointment as soon as possible for a visit in 1 week(s).   Why: Please call the office tomorrow morning to make an appointment for hospital follow-up within 1 week for voiding trial. Contact information: [address] [place]  [address] [phone]               Discharge Exam: Fredricka Weights   11/11/24 1733  Weight: 71.5 kg   S: Hematuria resolved.  Clear urine in the Foley bag.  No abdominal pain nausea vomiting fever chills.    BP 128/77 (BP Location: Right Arm)   Pulse 96   Temp 98.3 F (36.8 C) (Oral)   Resp 17   Ht  5' 4 (1.626 m)   Wt 71.5 kg   SpO2 98%   BMI 27.06 kg/m   Physical Exam General: Alert and oriented x 3, NAD Cardiovascular: S1 S2 clear, RRR.  Respiratory: CTAB, no wheezing, Gastrointestinal: Soft, nontender, nondistended, NBS Ext: Right hip disarticulation Gu: Foley catheter with clear urine Psych: Normal affect    Condition at discharge: fair  The results of significant diagnostics from this hospitalization (including imaging, microbiology, ancillary and laboratory) are listed below for reference.   Imaging Studies: CT ABDOMEN PELVIS WO CONTRAST Result Date: 11/11/2024 EXAM: CT ABDOMEN AND PELVIS WITHOUT CONTRAST 11/11/2024 12:49:00 AM TECHNIQUE: CT of the abdomen and pelvis was performed without the administration of intravenous contrast. Multiplanar reformatted images are provided for review. Automated exposure control, iterative  reconstruction, and/or weight-based adjustment of the mA/kV was utilized to reduce the radiation dose to as low as reasonably achievable. COMPARISON: CT abdomen and pelvis 04/26/2023. CLINICAL HISTORY: Paraplegic; gross/active bleeding from the urethra. FINDINGS: LOWER CHEST: No acute abnormality. LIVER: The liver is unremarkable. GALLBLADDER AND BILE DUCTS: Gallbladder surgically absent. No biliary ductal dilatation. SPLEEN: No acute abnormality. PANCREAS: No acute abnormality. ADRENAL GLANDS: No acute abnormality. KIDNEYS, URETERS AND BLADDER: Mild bilateral hydroureteronephrosis at the level of the bladder without obstructing calculus identified. Mild bilateral perinephric fat stranding which is unchanged. Marked bladder wall thickening with surrounding inflammation. The bladder is decompressed by foley catheter. Air seen within the bladder and within multiple small bladder diverticula. The prostate gland is enlarged. Small foci of air within the central right prostate gland, indeterminate. GI AND BOWEL: Stomach demonstrates no acute abnormality. The appendix appears normal. There is no bowel obstruction. PERITONEUM AND RETROPERITONEUM: No ascites. No free air. VASCULATURE: Aorta is normal in caliber. LYMPH NODES: No lymphadenopathy. REPRODUCTIVE ORGANS: No acute abnormality. BONES AND SOFT TISSUES: Interval amputation of the right femur with scarring in the surgical bed. Rectolumbar fusion hardware appears uncomplicated. Small fat containing left inguinal hernia. No acute osseous abnormality. No focal soft tissue abnormality. IMPRESSION: 1. Marked bladder wall thickening with surrounding inflammation concerning for cystitis. Neoplastic process not excluded. 2. Bladder is decompressed by Foley catheter, with air in the bladder and within multiple small bladder diverticula. 3. Mild bilateral hydroureteronephrosis to the level of the bladder, without obstructing calculus. 4. Prostatomegaly with small foci of air in  the central right prostate gland, indeterminate. Correlate clinically for infection. 5. Interval amputation of the right femur with scarring in the surgical bed. Electronically signed by: Greig Pique MD 11/11/2024 02:37 AM EST RP Workstation: [id number]    Microbiology: Results for orders placed or performed during the hospital encounter of 11/10/24  Blood culture (routine x 2)     Status: None (Preliminary result)   Collection Time: 11/11/24  3:20 AM   Specimen: BLOOD RIGHT ARM  Result Value Ref Range Status   Specimen Description BLOOD RIGHT ARM  Final   Special Requests   Final    BOTTLES DRAWN AEROBIC AND ANAEROBIC Blood Culture adequate volume   Culture   Final    NO GROWTH 1 DAY Performed at [facility], [address], [place], [place] [address]    Report Status PENDING  Incomplete  Blood culture (routine x 2)     Status: None (Preliminary result)   Collection Time: 11/11/24  4:45 AM   Specimen: BLOOD LEFT ARM  Result Value Ref Range Status   Specimen Description BLOOD LEFT ARM  Final   Special Requests   Final  BOTTLES DRAWN AEROBIC ONLY Blood Culture adequate volume   Culture   Final    NO GROWTH 1 DAY Performed at [facility], [address], [place], [place] [address]    Report Status PENDING  Incomplete    Labs: CBC: Recent Labs  Lab 11/10/24 1628 11/10/24 2339 11/11/24 1044 11/12/24 0512  WBC 9.8  --  16.6* 11.3*  NEUTROABS 6.4  --  14.7*  --   HGB 12.4* 11.5* 10.1* 9.8*  HCT 37.8* 34.3* 29.8* 29.6*  MCV 92.0  --  89.5 90.0  PLT 247  --  189 153   Basic Metabolic Panel: Recent Labs  Lab 11/10/24 1628 11/11/24 0757 11/12/24 0512  NA 138 136 139  K 4.9 4.0 4.0  CL 102 104 108  CO2 24 19* 19*  GLUCOSE 196* 146* 125*  BUN 34* 42* 28*  CREATININE 1.15 1.64* 1.12  CALCIUM  9.4 8.0* 8.4*   Liver Function Tests: Recent Labs  Lab 11/12/24 0512  AST 17  ALT 12  ALKPHOS 94  BILITOT 0.3  PROT 6.0*  ALBUMIN  3.0*   CBG: Recent  Labs  Lab 11/11/24 0815 11/11/24 1122 11/11/24 1613 11/11/24 2013 11/12/24 0746  GLUCAP 146* 172* 163* 162* 113*    Discharge time spent: greater than 30 minutes.  Signed: Nydia Distance, MD [facility] 11/12/2024 "

## 2024-11-13 ENCOUNTER — Ambulatory Visit: Payer: Self-pay | Admitting: Critical Care Medicine

## 2024-11-13 ENCOUNTER — Telehealth: Payer: Self-pay | Admitting: *Deleted

## 2024-11-14 ENCOUNTER — Telehealth: Payer: Self-pay | Admitting: Family Medicine

## 2024-11-14 DIAGNOSIS — R310 Gross hematuria: Secondary | ICD-10-CM

## 2024-11-14 DIAGNOSIS — N319 Neuromuscular dysfunction of bladder, unspecified: Secondary | ICD-10-CM

## 2024-11-14 NOTE — Telephone Encounter (Signed)
 Copied from CRM [id number]. Topic: Referral - Question >> Nov 14, 2024 12:14 PM Antony RAMAN wrote:  Reason for CRM: patient says the urologist that he was referred to does not accept the orange card and he needs a referral to a different one. Its $300 for the appointment

## 2024-11-14 NOTE — Addendum Note (Signed)
 Addended by: Constantinos Krempasky on: 11/14/2024 01:26 PM   Modules accepted: Orders

## 2024-11-14 NOTE — Telephone Encounter (Signed)
 Routing to PCP for review.

## 2024-11-14 NOTE — Telephone Encounter (Signed)
 Referral has been placed.

## 2024-11-15 NOTE — Telephone Encounter (Signed)
 Patient was called and informed that he will need to apply for OC and then referral will be placed again.

## 2024-11-16 ENCOUNTER — Encounter (HOSPITAL_COMMUNITY): Payer: Self-pay

## 2024-11-16 ENCOUNTER — Emergency Department (HOSPITAL_COMMUNITY)
Admission: EM | Admit: 2024-11-16 | Discharge: 2024-11-16 | Disposition: A | Discharge status: Home / Self Care | Payer: Self-pay | Attending: Emergency Medicine | Admitting: Emergency Medicine

## 2024-11-16 ENCOUNTER — Other Ambulatory Visit: Payer: Self-pay

## 2024-11-16 DIAGNOSIS — Y732 Prosthetic and other implants, materials and accessory gastroenterology and urology devices associated with adverse incidents: Secondary | ICD-10-CM | POA: Insufficient documentation

## 2024-11-16 DIAGNOSIS — T83098A Other mechanical complication of other indwelling urethral catheter, initial encounter: Secondary | ICD-10-CM | POA: Insufficient documentation

## 2024-11-16 DIAGNOSIS — T839XXA Unspecified complication of genitourinary prosthetic device, implant and graft, initial encounter: Secondary | ICD-10-CM

## 2024-11-16 LAB — CULTURE, BLOOD (ROUTINE X 2)
Culture: NO GROWTH
Culture: NO GROWTH
Special Requests: ADEQUATE
Special Requests: ADEQUATE

## 2024-11-16 NOTE — ED Triage Notes (Signed)
 Pt states he would like foley catheter removed. No other urinary issues noted. Axox4.

## 2024-11-16 NOTE — ED Provider Notes (Signed)
 " [facility] Provider Note   CSN: [id number] Arrival date & time: 11/16/24  1117     Patient presents with: No chief complaint on file.   John Meza is a 64 y.o. male.   HPI Patient presents with his wife who assist with history.  He presents 1 week after discharge from hospital following admission for urinary tract infection, now with request for evaluation of his Foley catheter. No fevers, chills, pain, nausea, vomiting, or other complaints, he is draining urine.  Patient typically self caths, but had catheter placed during evaluation for infection last week.    Prior to Admission medications  Medication Sig Start Date End Date Taking? Authorizing Provider  amLODipine  (NORVASC ) 2.5 MG tablet Take 1 tablet (2.5 mg total) by mouth daily. 09/04/24   Newlin, Enobong, MD  atorvastatin  (LIPITOR) 20 MG tablet Take 1 tablet (20 mg total) by mouth daily at 12 noon. 09/04/24   Newlin, Enobong, MD  cephALEXin  (KEFLEX ) 500 MG capsule Take 1 capsule (500 mg total) by mouth 2 (two) times daily for 5 days. 11/12/24 11/17/24  Rai, Nydia POUR, MD  dapagliflozin  propanediol (FARXIGA ) 10 MG TABS tablet Take 1 tablet (10 mg total) by mouth daily. 09/06/24   Newlin, Enobong, MD  metFORMIN  (GLUCOPHAGE ) 1000 MG tablet Take 1 tablet (1,000 mg total) by mouth 2 (two) times daily with a meal. 09/04/24   Delbert Clam, MD  ondansetron  (ZOFRAN ) 4 MG tablet Take 1 tablet (4 mg total) by mouth every 8 (eight) hours as needed for nausea or vomiting. 11/12/24   Rai, Nydia POUR, MD  pantoprazole  (PROTONIX ) 40 MG tablet Take 1 tablet (40 mg total) by mouth at bedtime. 09/04/24   Newlin, Enobong, MD  valsartan -hydrochlorothiazide  (DIOVAN -HCT) 320-25 MG tablet Take 1 tablet by mouth daily. 03/02/24   Newlin, Enobong, MD  loratadine  (CLARITIN ) 10 MG tablet Take 1 tablet (10 mg total) by mouth daily. As needed for itchy throat/allergy symptoms Patient not taking:  Reported on 03/24/2019 12/26/18 07/05/20  Alec House, MD    Allergies: Macrobid  [nitrofurantoin ]    Review of Systems  Updated Vital Signs BP 134/73   Pulse 80   Temp 98.2 F (36.8 C) (Oral)   Resp 18   SpO2 100%   Physical Exam Vitals and nursing note reviewed.  Constitutional:      General: He is not in acute distress.    Appearance: He is well-developed.  HENT:     Head: Normocephalic and atraumatic.  Eyes:     Conjunctiva/sclera: Conjunctivae normal.  Pulmonary:     Effort: Pulmonary effort is normal. No respiratory distress.     Breath sounds: No stridor.  Abdominal:     General: There is no distension.  Musculoskeletal:     Comments: Status post amputation  Skin:    General: Skin is warm and dry.  Neurological:     Mental Status: He is alert and oriented to person, place, and time.     (all labs ordered are listed, but only abnormal results are displayed) Labs Reviewed - No data to display  EKG: None  Radiology: No results found.   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 Time is appropriate for removal of catheter, per the patient's request, this was accommodated after discussion on return precautions should he develop acute urinary retention as well.  Patient discharged.  Amount and/or Complexity of Data Reviewed Independent Historian: spouse External Data Reviewed: notes.    Details: Discharge summary from last week's hospitalization reviewed  Risk Prescription drug management. Diagnosis or treatment significantly limited by social determinants of health.    Final diagnoses:  Foley  catheter problem, initial encounter    ED Discharge Orders     None          Garrick Charleston, MD 11/16/24 1342  "

## 2024-11-17 NOTE — Telephone Encounter (Signed)
 Noted.

## 2025-03-06 ENCOUNTER — Ambulatory Visit: Payer: Self-pay | Admitting: Family Medicine
